# Patient Record
Sex: Male | Born: 1950
Health system: Southern US, Community
[De-identification: ages and names within clinical notes are randomized; demographics above are authoritative.]

## PROBLEM LIST (undated history)

## (undated) DIAGNOSIS — T7840XA Allergy, unspecified, initial encounter: Secondary | ICD-10-CM

## (undated) DIAGNOSIS — C3490 Malignant neoplasm of unspecified part of unspecified bronchus or lung: Secondary | ICD-10-CM

## (undated) DIAGNOSIS — I10 Essential (primary) hypertension: Secondary | ICD-10-CM

## (undated) DIAGNOSIS — K219 Gastro-esophageal reflux disease without esophagitis: Secondary | ICD-10-CM

## (undated) DIAGNOSIS — N529 Male erectile dysfunction, unspecified: Secondary | ICD-10-CM

## (undated) DIAGNOSIS — G893 Neoplasm related pain (acute) (chronic): Secondary | ICD-10-CM

## (undated) DIAGNOSIS — C801 Malignant (primary) neoplasm, unspecified: Secondary | ICD-10-CM

## (undated) DIAGNOSIS — E13319 Other specified diabetes mellitus with unspecified diabetic retinopathy without macular edema: Secondary | ICD-10-CM

## (undated) DIAGNOSIS — I639 Cerebral infarction, unspecified: Secondary | ICD-10-CM

## (undated) DIAGNOSIS — I509 Heart failure, unspecified: Secondary | ICD-10-CM

## (undated) DIAGNOSIS — E785 Hyperlipidemia, unspecified: Secondary | ICD-10-CM

## (undated) DIAGNOSIS — I6529 Occlusion and stenosis of unspecified carotid artery: Secondary | ICD-10-CM

## (undated) DIAGNOSIS — F419 Anxiety disorder, unspecified: Secondary | ICD-10-CM

## (undated) DIAGNOSIS — N400 Enlarged prostate without lower urinary tract symptoms: Secondary | ICD-10-CM

## (undated) HISTORY — DX: Occlusion and stenosis of unspecified carotid artery: I65.29

## (undated) HISTORY — DX: Allergy, unspecified, initial encounter: T78.40XA

## (undated) HISTORY — DX: Benign prostatic hyperplasia without lower urinary tract symptoms: N40.0

## (undated) HISTORY — DX: Neoplasm related pain (acute) (chronic): G89.3

## (undated) HISTORY — DX: Other specified diabetes mellitus with unspecified diabetic retinopathy without macular edema: E13.319

## (undated) HISTORY — DX: Essential (primary) hypertension: I10

## (undated) HISTORY — DX: Hyperlipidemia, unspecified: E78.5

## (undated) HISTORY — PX: TONSILLECTOMY: SUR1361

## (undated) HISTORY — DX: Malignant (primary) neoplasm, unspecified: C80.1

## (undated) HISTORY — DX: Male erectile dysfunction, unspecified: N52.9

## (undated) MED FILL — Fosaprepitant Dimeglumine For IV Infusion 150 MG (Base Eq): INTRAVENOUS | Qty: 5 | Status: AC

## (undated) MED FILL — Dexamethasone Sodium Phosphate Inj 100 MG/10ML: INTRAMUSCULAR | Qty: 1 | Status: AC

---

## 1997-09-19 ENCOUNTER — Ambulatory Visit (HOSPITAL_COMMUNITY): Admission: RE | Admit: 1997-09-19 | Discharge: 1997-09-19 | Payer: Self-pay | Admitting: Cardiology

## 1997-09-22 ENCOUNTER — Ambulatory Visit (HOSPITAL_COMMUNITY): Admission: RE | Admit: 1997-09-22 | Discharge: 1997-09-22 | Payer: Self-pay | Admitting: Cardiology

## 2000-07-14 ENCOUNTER — Encounter (INDEPENDENT_AMBULATORY_CARE_PROVIDER_SITE_OTHER): Payer: Self-pay

## 2000-07-14 ENCOUNTER — Other Ambulatory Visit: Admission: RE | Admit: 2000-07-14 | Discharge: 2000-07-14 | Payer: Self-pay | Admitting: Otolaryngology

## 2001-07-28 ENCOUNTER — Encounter: Payer: Self-pay | Admitting: Emergency Medicine

## 2001-07-28 ENCOUNTER — Emergency Department (HOSPITAL_COMMUNITY): Admission: EM | Admit: 2001-07-28 | Discharge: 2001-07-28 | Payer: Self-pay | Admitting: Emergency Medicine

## 2006-03-03 HISTORY — PX: MELANOMA EXCISION: SHX5266

## 2009-02-17 ENCOUNTER — Emergency Department (HOSPITAL_COMMUNITY): Admission: EM | Admit: 2009-02-17 | Discharge: 2009-02-18 | Payer: Self-pay | Admitting: Emergency Medicine

## 2010-01-08 ENCOUNTER — Encounter: Payer: Self-pay | Admitting: Physician Assistant

## 2010-02-07 ENCOUNTER — Encounter: Payer: Self-pay | Admitting: Cardiovascular Disease

## 2010-02-07 DIAGNOSIS — R079 Chest pain, unspecified: Secondary | ICD-10-CM

## 2010-02-07 DIAGNOSIS — E119 Type 2 diabetes mellitus without complications: Secondary | ICD-10-CM

## 2010-02-07 DIAGNOSIS — R0989 Other specified symptoms and signs involving the circulatory and respiratory systems: Secondary | ICD-10-CM

## 2010-02-07 DIAGNOSIS — E7800 Pure hypercholesterolemia, unspecified: Secondary | ICD-10-CM | POA: Insufficient documentation

## 2010-02-07 DIAGNOSIS — I10 Essential (primary) hypertension: Secondary | ICD-10-CM | POA: Insufficient documentation

## 2010-02-08 ENCOUNTER — Encounter: Payer: Self-pay | Admitting: Cardiovascular Disease

## 2010-02-08 ENCOUNTER — Ambulatory Visit: Payer: Self-pay

## 2010-02-08 ENCOUNTER — Ambulatory Visit: Payer: Self-pay | Admitting: Internal Medicine

## 2010-02-08 ENCOUNTER — Encounter: Payer: Self-pay | Admitting: Internal Medicine

## 2010-04-04 NOTE — Miscellaneous (Signed)
Summary: Orders Update  Clinical Lists Changes  Orders: Added new Test order of Carotid Duplex (Carotid Duplex) - Signed 

## 2010-04-10 NOTE — Letter (Signed)
Summary: [facility]  [facility]   Imported By: Marylou Mccoy 04/03/2010 15:28:05  _____________________________________________________________________  External Attachment:    Type:   Image     Comment:   External Document

## 2011-05-28 ENCOUNTER — Other Ambulatory Visit: Payer: Self-pay | Admitting: Cardiology

## 2011-05-28 DIAGNOSIS — I6529 Occlusion and stenosis of unspecified carotid artery: Secondary | ICD-10-CM

## 2011-05-29 ENCOUNTER — Encounter (INDEPENDENT_AMBULATORY_CARE_PROVIDER_SITE_OTHER): Payer: 59

## 2011-05-29 DIAGNOSIS — R0989 Other specified symptoms and signs involving the circulatory and respiratory systems: Secondary | ICD-10-CM

## 2011-05-29 DIAGNOSIS — I6529 Occlusion and stenosis of unspecified carotid artery: Secondary | ICD-10-CM

## 2011-10-28 ENCOUNTER — Encounter: Payer: Self-pay | Admitting: Vascular Surgery

## 2011-11-06 ENCOUNTER — Encounter: Payer: Self-pay | Admitting: Vascular Surgery

## 2011-11-07 ENCOUNTER — Encounter: Payer: Self-pay | Admitting: Vascular Surgery

## 2011-11-07 ENCOUNTER — Ambulatory Visit (INDEPENDENT_AMBULATORY_CARE_PROVIDER_SITE_OTHER): Payer: 59 | Admitting: Vascular Surgery

## 2011-11-07 VITALS — BP 117/57 | HR 67 | Resp 18 | Ht 67.0 in | Wt 175.3 lb

## 2011-11-07 DIAGNOSIS — I6523 Occlusion and stenosis of bilateral carotid arteries: Secondary | ICD-10-CM

## 2011-11-07 DIAGNOSIS — I658 Occlusion and stenosis of other precerebral arteries: Secondary | ICD-10-CM

## 2011-11-07 DIAGNOSIS — I6529 Occlusion and stenosis of unspecified carotid artery: Secondary | ICD-10-CM | POA: Insufficient documentation

## 2011-11-07 NOTE — Progress Notes (Signed)
[facility] [place]  New Carotid Patient  Referred by:  Dr. Marchia Bond  Reason for referral: B carotid stenosis  History of Present Illness  Micheal Donovan is a 61 y.o. (October 06, 1950) male who presents with chief complaint: B narrowed neck arteries.  Previous carotid studies demonstrated: RICA 60-79% stenosis, LICA 60-79% stenosis.  Patient has no history of TIA or stroke symptom.  The patient has never had amaurosis fugax or monocular blindness.  The patient has never had facial drooping or hemiplegia.  The patient has never had receptive or expressive aphasia.   The patient's risks factors for carotid disease include: HTN, Hyperlipidemia, DM.  Past Medical History  Diagnosis Date  . Hypertension   . Hyperlipidemia   . Allergy   . ED (erectile dysfunction)   . Diabetes mellitus   . Cancer     Melanoma on Neck    2008  . BPH (benign prostatic hypertrophy)   . Carotid artery occlusion     Past Surgical History  Procedure Date  . Melanoma excision 2008    Left side of neck    History   Social History  . Marital Status: Single    Spouse Name: N/A    Number of Children: N/A  . Years of Education: N/A   Occupational History  . Not on file.   Social History Main Topics  . Smoking status: Never Smoker   . Smokeless tobacco: Not on file  . Alcohol Use: No  . Drug Use: No  . Sexually Active:    Other Topics Concern  . Not on file   Social History Narrative  . No narrative on file    Family History  Problem Relation Age of Onset  . COPD Mother   . Heart disease Father   . Heart disease Brother     MI at age 4    Current Outpatient Prescriptions on File Prior to Visit  Medication Sig Dispense Refill  . aspirin 325 MG tablet Take 325 mg by mouth daily.      . Cholecalciferol (VITAMIN D-3) 5000 UNITS TABS Take by mouth.      . insulin glargine (LANTUS) 100 UNIT/ML injection Inject 26 Units into the skin at bedtime.      [name] losartan (COZAAR) 50 MG  tablet Take 50 mg by mouth daily.      . metFORMIN (GLUCOPHAGE) 500 MG tablet Take 500 mg by mouth 2 (two) times daily with a meal.      . niacin (NIASPAN) 500 MG CR tablet Take 500 mg by mouth every morning.      [name] omega-3 acid ethyl esters (LOVAZA) 1 G capsule Take 2 g by mouth 2 (two) times daily.      [name] omeprazole (PRILOSEC) 40 MG capsule Take 40 mg by mouth daily.      . pioglitazone (ACTOS) 15 MG tablet Take 15 mg by mouth daily.      . rosuvastatin (CRESTOR) 20 MG tablet Take 20 mg by mouth daily.        Allergies  Allergen Reactions  . Niaspan (Niacin)     FLUSHING    REVIEW OF SYSTEMS:  (Positives checked otherwise negative)  CARDIOVASCULAR:  [ ]  chest pain, [ ]  chest pressure, [ ]  palpitations, [ ]  shortness of breath when laying flat, [ ]  shortness of breath with exertion,   [ ]  pain in feet when walking, [ ]  pain in feet when laying flat, [ ]  history of  blood clot in veins (DVT), [ ]  history of phlebitis, [ ]  swelling in legs, [ ]  varicose veins  PULMONARY:  [ ]  productive cough, [ ]  asthma, [ ]  wheezing  NEUROLOGIC:  [ ]  weakness in arms or legs, [ ]  numbness in arms or legs, [ ]  difficulty speaking or slurred speech, [ ]  temporary loss of vision in one eye, [ ]  dizziness  HEMATOLOGIC:  [ ]  bleeding problems, [ ]  problems with blood clotting too easily  MUSCULOSKEL:  [ ]  joint pain, [ ]  joint swelling  GASTROINTEST:  [ ]   Vomiting blood, [ ]   Blood in stool     GENITOURINARY:  [ ]   Burning with urination, [ ]   Blood in urine  PSYCHIATRIC:  [ ]  history of major depression  INTEGUMENTARY:  [ ]  rashes, [ ]  ulcers  CONSTITUTIONAL:  [ ]  fever, [ ]  chills  Physical Examination  Filed Vitals:   11/07/11 1028 11/07/11 1030  BP: 119/62 117/57  Pulse: 66 67  Resp: 18   Height: 5\' 7"  (1.702 m)   Weight: 175 lb 4.8 oz (79.516 kg)   SpO2: 98% 100%   Body mass index is 27.46 kg/(m^2).  General: A&O x 3, WDWN  Head: [place]/AT  Ear/Nose/Throat: Hearing grossly intact,  nares w/o erythema or drainage, oropharynx w/o Erythema/Exudate  Eyes: PERRLA, EOMI  Neck: Supple, no nuchal rigidity, no palpable LAD  Pulmonary: Sym exp, good air movt, CTAB, no rales, rhonchi, & wheezing  Cardiac: RRR, Nl S1, S2, no Murmurs, rubs or gallops  Vascular: Vessel Right Left  Radial Palpable Palpable  Ulnar Palpable Palpable  Brachial Palpable Palpable  Carotid Palpable, without bruit Palpable, without bruit  Aorta Not palpable N/A  Femoral Palpable Palpable  Popliteal Not palpable Not palpable  PT Palpable Palpable  DP Palpable Palpable   Gastrointestinal: soft, NTND, -G/R, - HSM, - masses, - CVAT B  Musculoskeletal: M/S 5/5 throughout , Extremities without ischemic changes   Neurologic: CN 2-12 intact , Pain and light touch intact in extremities , Motor exam as listed above  Psychiatric: Judgment intact, Mood & affect appropriate for pt's clinical situation  Dermatologic: See M/S exam for extremity exam, no rashes otherwise noted  Lymph : No Cervical, Axillary, or Inguinal lymphadenopathy   OSH Non-Invasive Vascular Imaging  CAROTID DUPLEX (Date: 05/31/11):   R ICA stenosis: 60-79%  R VA: patent and antegrade  L ICA stenosis: 60-79%  L VA: patent and antegrade  Outside Studies/Documentation 5 pages of outside documents were reviewed including: outside PCP record and outside carotid duplex.  Medical Decision Making  Micheal Donovan is a 61 y.o. male who presents with: asx B ICA stenosis <80%.   Based on the patient's vascular studies and examination, I have offered the patient: q6 month surveillance with B carotid duplex.    I do not routinely operate on asx patient with carotid stenoses <80% based on the findings in the CREST trial.  I discussed in depth with the patient the nature of atherosclerosis, and emphasized the importance of maximal medical management including strict control of blood pressure, blood glucose, and lipid levels, obtaining  regular exercise, antiplatelet agents, and cessation of smoking.    In his case, focus on his DM will be critical to avoid progression of underlying disease.  The patient is aware that without maximal medical management the underlying atherosclerotic disease process will progress, limiting the benefit of any interventions.  Thank you for allowing [place] to participate in this  patient's care.  Leonides Sake, MD [facility] [place] Office: [phone] Pager: [phone]  11/07/2011, 12:44 PM

## 2011-11-07 NOTE — Addendum Note (Signed)
Addended by: Sharee Pimple on: 11/07/2011 01:19 PM   Modules accepted: Orders

## 2012-05-06 ENCOUNTER — Encounter: Payer: Self-pay | Admitting: Neurosurgery

## 2012-05-07 ENCOUNTER — Ambulatory Visit: Payer: 59 | Admitting: Neurosurgery

## 2012-05-07 ENCOUNTER — Other Ambulatory Visit: Payer: 59

## 2012-06-04 ENCOUNTER — Other Ambulatory Visit (INDEPENDENT_AMBULATORY_CARE_PROVIDER_SITE_OTHER): Payer: 59 | Admitting: Vascular Surgery

## 2012-06-04 ENCOUNTER — Ambulatory Visit: Payer: 59 | Admitting: Neurosurgery

## 2012-06-04 DIAGNOSIS — I6523 Occlusion and stenosis of bilateral carotid arteries: Secondary | ICD-10-CM

## 2012-06-04 DIAGNOSIS — I6529 Occlusion and stenosis of unspecified carotid artery: Secondary | ICD-10-CM

## 2012-06-07 ENCOUNTER — Encounter: Payer: Self-pay | Admitting: Vascular Surgery

## 2012-06-11 ENCOUNTER — Telehealth: Payer: Self-pay | Admitting: Family Medicine

## 2012-06-11 MED ORDER — INSULIN GLARGINE 100 UNIT/ML ~~LOC~~ SOLN: 26.0000 [IU] | Freq: Every day | SUBCUTANEOUS | Status: DC

## 2012-06-11 NOTE — Telephone Encounter (Signed)
Med rf per protocol.the patient will call later to make appt

## 2012-07-13 ENCOUNTER — Other Ambulatory Visit: Payer: Self-pay | Admitting: Family Medicine

## 2012-07-13 ENCOUNTER — Other Ambulatory Visit (INDEPENDENT_AMBULATORY_CARE_PROVIDER_SITE_OTHER): Payer: 59

## 2012-07-13 DIAGNOSIS — I10 Essential (primary) hypertension: Secondary | ICD-10-CM

## 2012-07-13 DIAGNOSIS — N400 Enlarged prostate without lower urinary tract symptoms: Secondary | ICD-10-CM

## 2012-07-13 DIAGNOSIS — E785 Hyperlipidemia, unspecified: Secondary | ICD-10-CM

## 2012-07-13 DIAGNOSIS — E119 Type 2 diabetes mellitus without complications: Secondary | ICD-10-CM

## 2012-07-13 DIAGNOSIS — Z0000 Encounter for general adult medical examination without abnormal findings: Secondary | ICD-10-CM

## 2012-07-13 LAB — CBC WITH DIFFERENTIAL/PLATELET
Eosinophils Absolute: 0.2 10*3/uL (ref 0.0–0.7)
Eosinophils Relative: 2 % (ref 0–5)
HCT: 44.6 % (ref 39.0–52.0)
Hemoglobin: 15.1 g/dL (ref 13.0–17.0)
Lymphocytes Relative: 28 % (ref 12–46)
Lymphs Abs: 1.9 10*3/uL (ref 0.7–4.0)
MCH: 29.8 pg (ref 26.0–34.0)
MCV: 88.1 fL (ref 78.0–100.0)
Monocytes Absolute: 0.5 10*3/uL (ref 0.1–1.0)
Monocytes Relative: 7 % (ref 3–12)
Platelets: 254 10*3/uL (ref 150–400)
RBC: 5.06 MIL/uL (ref 4.22–5.81)

## 2012-07-13 LAB — LIPID PANEL
Cholesterol: 124 mg/dL (ref 0–200)
HDL: 30 mg/dL — ABNORMAL LOW (ref >39)
LDL Cholesterol: 70 mg/dL (ref 0–99)
Triglycerides: 121 mg/dL (ref <150)

## 2012-07-13 LAB — PSA: PSA: 1.02 ng/mL (ref <=4.00)

## 2012-07-20 ENCOUNTER — Ambulatory Visit (INDEPENDENT_AMBULATORY_CARE_PROVIDER_SITE_OTHER): Payer: 59 | Admitting: Family Medicine

## 2012-07-20 ENCOUNTER — Encounter: Payer: Self-pay | Admitting: Family Medicine

## 2012-07-20 VITALS — BP 144/94 | HR 56 | Temp 98.0°F | Resp 18 | Ht 67.25 in | Wt 180.0 lb

## 2012-07-20 DIAGNOSIS — Z0000 Encounter for general adult medical examination without abnormal findings: Secondary | ICD-10-CM

## 2012-07-20 DIAGNOSIS — Z23 Encounter for immunization: Secondary | ICD-10-CM

## 2012-07-20 DIAGNOSIS — I10 Essential (primary) hypertension: Secondary | ICD-10-CM

## 2012-07-20 DIAGNOSIS — E119 Type 2 diabetes mellitus without complications: Secondary | ICD-10-CM

## 2012-07-20 DIAGNOSIS — E785 Hyperlipidemia, unspecified: Secondary | ICD-10-CM

## 2012-07-20 DIAGNOSIS — J019 Acute sinusitis, unspecified: Secondary | ICD-10-CM

## 2012-07-20 LAB — COMPREHENSIVE METABOLIC PANEL
ALT: 20 U/L (ref 0–53)
AST: 27 U/L (ref 0–37)
BUN: 15 mg/dL (ref 6–23)
Calcium: 10.2 mg/dL (ref 8.4–10.5)
Chloride: 104 mEq/L (ref 96–112)
Creat: 0.94 mg/dL (ref 0.50–1.35)
Total Bilirubin: 0.5 mg/dL (ref 0.3–1.2)

## 2012-07-20 MED ORDER — SILDENAFIL CITRATE 100 MG PO TABS: 50.0000 mg | ORAL_TABLET | Freq: Every day | ORAL | Status: DC | PRN

## 2012-07-20 MED ORDER — SITAGLIPTIN PHOSPHATE 100 MG PO TABS: 100.0000 mg | ORAL_TABLET | Freq: Every day | ORAL | Status: DC

## 2012-07-20 MED ORDER — AMOXICILLIN-POT CLAVULANATE 875-125 MG PO TABS: 1.0000 | ORAL_TABLET | Freq: Two times a day (BID) | ORAL | Status: DC

## 2012-07-20 NOTE — Progress Notes (Signed)
Subjective:    Patient ID: Micheal Donovan, male    DOB: 05/31/1950, 62 y.o.   MRN: [id number]  HPI Patient is here today for a physical. However he has multiple other medical problems. 1) his blood pressure is elevated 144/94. He has not been taking his losartan 50 mg by mouth daily the last 3 days. He denies any chest pain or shortness of breath.  2) his diabetes is not well controlled. He has not seen a doctor. His A1c was 7.7. His L. metformin 500 mg by mouth daily. He cannot tolerate higher doses. He is also taking Actos 30 mg by mouth daily. He also takes Lantus 28 units subcutaneous each bedtime. His fasting blood sugars are 80 to 130 and well controlled. This indicates that his postprandial sugars must be elevated. He is reporting burning dysesthesias in the feet.  3) he reports left maxillary sinus pain for 3 months and this is accompanied with purulent left nasal discharge and rhinorrhea. He denies fevers or headaches. 4) he has a slight raised area on his left scalp. It is approximately 2 cm in diameter. It is soft. It's seems to be subcutaneous swelling. There is no obvious mass. He states it's been there for several months. He may have hit his head this area. Past Medical History  Diagnosis Date  . Hypertension   . Hyperlipidemia   . Allergy   . ED (erectile dysfunction)   . Diabetes mellitus   . Cancer     Melanoma on Neck    2008  . BPH (benign prostatic hypertrophy)   . Carotid artery occlusion    Current Outpatient Prescriptions on File Prior to Visit  Medication Sig Dispense Refill  . aspirin 325 MG tablet Take 325 mg by mouth daily.      . Cholecalciferol (VITAMIN D-3) 5000 UNITS TABS Take by mouth.      . losartan (COZAAR) 50 MG tablet Take 50 mg by mouth daily.      . metFORMIN (GLUCOPHAGE) 500 MG tablet Take 500 mg by mouth 2 (two) times daily with a meal.      . niacin (NIASPAN) 500 MG CR tablet Take 500 mg by mouth every morning.      [name] omega-3 acid ethyl esters  (LOVAZA) 1 G capsule Take 2 g by mouth 2 (two) times daily.      [name] omeprazole (PRILOSEC) 40 MG capsule Take 40 mg by mouth daily.      . pioglitazone (ACTOS) 15 MG tablet Take 15 mg by mouth daily.      . rosuvastatin (CRESTOR) 20 MG tablet Take 20 mg by mouth daily.       No current facility-administered medications on file prior to visit.   Allergies  Allergen Reactions  . Niaspan (Niacin)     FLUSHING   History   Social History  . Marital Status: Single    Spouse Name: N/A    Number of Children: N/A  . Years of Education: N/A   Occupational History  . Not on file.   Social History Main Topics  . Smoking status: Former Smoker    Quit date: 07/21/1990  . Smokeless tobacco: Never Used  . Alcohol Use: No  . Drug Use: No  . Sexually Active: Not on file   Other Topics Concern  . Not on file   Social History Narrative  . No narrative on file   Family History  Problem Relation Age of Onset  . COPD Mother   .  Heart disease Father   . Heart disease Brother     MI at age 46      Review of Systems  All other systems reviewed and are negative.       Objective:   Physical Exam  Constitutional: He is oriented to person, place, and time. He appears well-developed and well-nourished.  HENT:  Head: Normocephalic and atraumatic.  Right Ear: External ear normal.  Left Ear: External ear normal.  Nose: Nose normal.  Mouth/Throat: Oropharynx is clear and moist. No oropharyngeal exudate.  Eyes: Conjunctivae and EOM are normal. Pupils are equal, round, and reactive to light. Right eye exhibits no discharge. Left eye exhibits no discharge. No scleral icterus.  Neck: Normal range of motion. Neck supple. No JVD present. No tracheal deviation present. No thyromegaly present.  Cardiovascular: Normal rate, regular rhythm, normal heart sounds and intact distal pulses.  Exam reveals no gallop and no friction rub.   No murmur heard. Pulmonary/Chest: Effort normal and breath sounds  normal. No respiratory distress. He has no wheezes. He has no rales. He exhibits no tenderness.  Abdominal: Soft. Bowel sounds are normal. He exhibits no distension and no mass. There is no tenderness. There is no rebound and no guarding.  Genitourinary: Rectum normal, prostate normal and penis normal.  Musculoskeletal: Normal range of motion. He exhibits no edema and no tenderness.  Lymphadenopathy:    He has no cervical adenopathy.  Neurological: He is alert and oriented to person, place, and time. He has normal reflexes. He displays normal reflexes. No cranial nerve deficit. He exhibits normal muscle tone. Coordination normal.  Skin: Skin is warm and dry. No rash noted. No erythema. No pallor.  Psychiatric: He has a normal mood and affect. His behavior is normal. Judgment and thought content normal.          Assessment & Plan:  1. Immunization due - Pneumococcal polysaccharide vaccine 23-valent greater than or equal to 2yo subcutaneous/IM I did discuss the shingle shot with the patient. He will check on the price and return if he wants the vaccine. 2. Routine general medical examination at a health care facility I reviewed with the patient his CBC, fasting lipid panel, hemoglobin A1c, PSA. Patient's physical exam is normal.  He is overdue for colonoscopy. I will schedule that referral.  His prostate exam is normal his PSA is normal.  I recommended an ophthalmology exam which he will schedule. - Ambulatory referral to Gastroenterology  3. Acute rhinosinusitis Augmentin 875 mg 1 by mouth twice a day for 10 days  4. DM (diabetes mellitus) Control is not optimal. Add Januvia 100 mg by mouth daily. Recheck hemoglobin A1c in 3 months. If his two-hour postprandial sugars are not under control at that time, he will likely need prandial NovoLog.  I also spent 10 minutes discussing with him a low carbohydrate diet. I emphasized that he needs to be more diligent in limiting carbohydrates with his  meal.  For instance he tends to eat a lot spaghetti and potatoes  5. HTN (hypertension) Resume his and 50 mg by mouth daily and recheck blood pressure in one month the  6. HLD (hyperlipidemia) I reviewed the patient's fasting lipid panel. His LDL is at goal. His HDL remains low at 30. I recommended he increase his aerobic exercise. He does not tolerate higher doses of Niaspan.

## 2012-07-21 ENCOUNTER — Telehealth: Payer: Self-pay | Admitting: Family Medicine

## 2012-07-21 MED ORDER — ROSUVASTATIN CALCIUM 20 MG PO TABS: 20.0000 mg | ORAL_TABLET | Freq: Every day | ORAL | Status: DC

## 2012-07-21 NOTE — Telephone Encounter (Signed)
Med refilled.

## 2012-08-06 ENCOUNTER — Telehealth: Payer: Self-pay | Admitting: Family Medicine

## 2012-08-06 MED ORDER — PIOGLITAZONE HCL 15 MG PO TABS: 15.0000 mg | ORAL_TABLET | Freq: Every day | ORAL | Status: DC

## 2012-08-06 MED ORDER — NIACIN ER (ANTIHYPERLIPIDEMIC) 500 MG PO TBCR: 500.0000 mg | EXTENDED_RELEASE_TABLET | Freq: Every morning | ORAL | Status: DC

## 2012-08-06 NOTE — Telephone Encounter (Signed)
Medication refilled per protocol. 

## 2012-08-16 ENCOUNTER — Telehealth: Payer: Self-pay | Admitting: Family Medicine

## 2012-08-16 MED ORDER — OMEGA-3-ACID ETHYL ESTERS 1 G PO CAPS: 2.0000 g | ORAL_CAPSULE | Freq: Two times a day (BID) | ORAL | Status: DC

## 2012-08-16 MED ORDER — ROSUVASTATIN CALCIUM 20 MG PO TABS: 20.0000 mg | ORAL_TABLET | Freq: Every day | ORAL | Status: DC

## 2012-08-16 NOTE — Telephone Encounter (Signed)
Rx Refilled  

## 2012-10-18 ENCOUNTER — Telehealth: Payer: Self-pay | Admitting: Family Medicine

## 2012-10-18 MED ORDER — INSULIN GLARGINE 100 UNIT/ML SOLOSTAR PEN: 28.0000 [IU] | PEN_INJECTOR | SUBCUTANEOUS | Status: DC

## 2012-10-18 NOTE — Telephone Encounter (Signed)
Rx Refilled  

## 2012-10-18 NOTE — Telephone Encounter (Signed)
Lantus Solostar 100 units/mL inject 26 units QHS #15

## 2012-11-24 ENCOUNTER — Other Ambulatory Visit: Payer: Self-pay | Admitting: *Deleted

## 2012-12-09 ENCOUNTER — Encounter: Payer: Self-pay | Admitting: Family

## 2012-12-10 ENCOUNTER — Inpatient Hospital Stay (HOSPITAL_COMMUNITY): Admission: RE | Admit: 2012-12-10 | Payer: 59 | Source: Ambulatory Visit

## 2012-12-10 ENCOUNTER — Ambulatory Visit: Payer: 59 | Admitting: Family

## 2013-01-10 ENCOUNTER — Other Ambulatory Visit: Payer: Self-pay | Admitting: Family Medicine

## 2013-01-10 MED ORDER — SITAGLIPTIN PHOSPHATE 100 MG PO TABS: 100.0000 mg | ORAL_TABLET | Freq: Every day | ORAL | Status: DC

## 2013-01-10 NOTE — Telephone Encounter (Signed)
Letter sent for pt to make an appt and medication refilled

## 2013-01-25 ENCOUNTER — Other Ambulatory Visit: Payer: Self-pay | Admitting: Family Medicine

## 2013-01-25 DIAGNOSIS — E119 Type 2 diabetes mellitus without complications: Secondary | ICD-10-CM

## 2013-01-25 DIAGNOSIS — E785 Hyperlipidemia, unspecified: Secondary | ICD-10-CM

## 2013-01-25 DIAGNOSIS — Z79899 Other long term (current) drug therapy: Secondary | ICD-10-CM

## 2013-01-25 DIAGNOSIS — I10 Essential (primary) hypertension: Secondary | ICD-10-CM

## 2013-01-31 ENCOUNTER — Other Ambulatory Visit: Payer: 59

## 2013-01-31 DIAGNOSIS — I10 Essential (primary) hypertension: Secondary | ICD-10-CM

## 2013-01-31 DIAGNOSIS — Z79899 Other long term (current) drug therapy: Secondary | ICD-10-CM

## 2013-01-31 DIAGNOSIS — E785 Hyperlipidemia, unspecified: Secondary | ICD-10-CM

## 2013-01-31 DIAGNOSIS — E119 Type 2 diabetes mellitus without complications: Secondary | ICD-10-CM

## 2013-01-31 LAB — CBC WITH DIFFERENTIAL/PLATELET
Basophils Absolute: 0 10*3/uL (ref 0.0–0.1)
Basophils Relative: 1 % (ref 0–1)
Eosinophils Absolute: 0.2 10*3/uL (ref 0.0–0.7)
Eosinophils Relative: 2 % (ref 0–5)
HCT: 43 % (ref 39.0–52.0)
MCH: 31 pg (ref 26.0–34.0)
MCHC: 34.2 g/dL (ref 30.0–36.0)
MCV: 90.7 fL (ref 78.0–100.0)
Monocytes Absolute: 0.4 10*3/uL (ref 0.1–1.0)
Platelets: 243 10*3/uL (ref 150–400)
RDW: 13.7 % (ref 11.5–15.5)

## 2013-01-31 LAB — LIPID PANEL
Cholesterol: 121 mg/dL (ref 0–200)
HDL: 27 mg/dL — ABNORMAL LOW (ref >39)
LDL Cholesterol: 73 mg/dL (ref 0–99)
Total CHOL/HDL Ratio: 4.5 Ratio
Triglycerides: 104 mg/dL (ref <150)
VLDL: 21 mg/dL (ref 0–40)

## 2013-01-31 LAB — COMPREHENSIVE METABOLIC PANEL
ALT: 19 U/L (ref 0–53)
AST: 20 U/L (ref 0–37)
Alkaline Phosphatase: 78 U/L (ref 39–117)
BUN: 15 mg/dL (ref 6–23)
Calcium: 9.8 mg/dL (ref 8.4–10.5)
Chloride: 102 mEq/L (ref 96–112)
Creat: 0.81 mg/dL (ref 0.50–1.35)
Total Bilirubin: 0.4 mg/dL (ref 0.3–1.2)

## 2013-01-31 LAB — HEMOGLOBIN A1C: Hgb A1c MFr Bld: 6.9 % — ABNORMAL HIGH (ref <5.7)

## 2013-02-07 ENCOUNTER — Other Ambulatory Visit: Payer: Self-pay | Admitting: Family Medicine

## 2013-02-07 MED ORDER — PIOGLITAZONE HCL 15 MG PO TABS: 15.0000 mg | ORAL_TABLET | Freq: Every day | ORAL | Status: DC

## 2013-02-07 MED ORDER — OMEGA-3-ACID ETHYL ESTERS 1 G PO CAPS: 2.0000 g | ORAL_CAPSULE | Freq: Two times a day (BID) | ORAL | Status: DC

## 2013-02-07 NOTE — Telephone Encounter (Signed)
Rx Refilled and pt has appt. 02/10/13

## 2013-02-10 ENCOUNTER — Encounter: Payer: Self-pay | Admitting: Family Medicine

## 2013-02-10 ENCOUNTER — Ambulatory Visit (INDEPENDENT_AMBULATORY_CARE_PROVIDER_SITE_OTHER): Payer: 59 | Admitting: Family Medicine

## 2013-02-10 VITALS — BP 146/90 | HR 66 | Temp 97.1°F | Resp 16 | Ht 67.0 in | Wt 182.0 lb

## 2013-02-10 DIAGNOSIS — E785 Hyperlipidemia, unspecified: Secondary | ICD-10-CM

## 2013-02-10 DIAGNOSIS — E119 Type 2 diabetes mellitus without complications: Secondary | ICD-10-CM

## 2013-02-10 DIAGNOSIS — I10 Essential (primary) hypertension: Secondary | ICD-10-CM

## 2013-02-10 NOTE — Progress Notes (Signed)
Subjective:    Patient ID: Micheal Donovan, male    DOB: 18-Mar-1950, 62 y.o.   MRN: [id number]  HPI Patient is here today for followup of his diabetes mellitus type 2, his mixed dyslipidemia, and hypertension. His medication list is reviewed. He denies any chest pain shortness of breath or dyspnea on exertion.  He is currently on Lantus 22 units subcutaneous daily along with metformin and Januvia. His A1c has dropped from 7.7-6.9 with very happy with. His LDL cholesterol remains excellent at 73. His HDL remains very low, however this is Morrie Sheldon did for this patient given his critically low HDL in the past.  He is due for Pneumovax 23. He has had his flu shot at work.   Appointment on 01/31/2013  Component Date Value Range Status  . WBC 01/31/2013 6.2  4.0 - 10.5 K/uL Final  . RBC 01/31/2013 4.74  4.22 - 5.81 MIL/uL Final  . Hemoglobin 01/31/2013 14.7  13.0 - 17.0 g/dL Final  . HCT 40/98/1191 43.0  39.0 - 52.0 % Final  . MCV 01/31/2013 90.7  78.0 - 100.0 fL Final  . MCH 01/31/2013 31.0  26.0 - 34.0 pg Final  . MCHC 01/31/2013 34.2  30.0 - 36.0 g/dL Final  . RDW 47/82/9562 13.7  11.5 - 15.5 % Final  . Platelets 01/31/2013 243  150 - 400 K/uL Final  . Neutrophils Relative % 01/31/2013 58  43 - 77 % Final  . Neutro Abs 01/31/2013 3.6  1.7 - 7.7 K/uL Final  . Lymphocytes Relative 01/31/2013 32  12 - 46 % Final  . Lymphs Abs 01/31/2013 2.0  0.7 - 4.0 K/uL Final  . Monocytes Relative 01/31/2013 7  3 - 12 % Final  . Monocytes Absolute 01/31/2013 0.4  0.1 - 1.0 K/uL Final  . Eosinophils Relative 01/31/2013 2  0 - 5 % Final  . Eosinophils Absolute 01/31/2013 0.2  0.0 - 0.7 K/uL Final  . Basophils Relative 01/31/2013 1  0 - 1 % Final  . Basophils Absolute 01/31/2013 0.0  0.0 - 0.1 K/uL Final  . Smear Review 01/31/2013 Criteria for review not met   Final  . Sodium 01/31/2013 138  135 - 145 mEq/L Final  . Potassium 01/31/2013 4.6  3.5 - 5.3 mEq/L Final  . Chloride 01/31/2013 102  96 - 112 mEq/L Final   . CO2 01/31/2013 28  19 - 32 mEq/L Final  . Glucose, Bld 01/31/2013 129* 70 - 99 mg/dL Final  . BUN 13/10/6576 15  6 - 23 mg/dL Final  . Creat 46/96/2952 0.81  0.50 - 1.35 mg/dL Final  . Total Bilirubin 01/31/2013 0.4  0.3 - 1.2 mg/dL Final  . Alkaline Phosphatase 01/31/2013 78  39 - 117 U/L Final  . AST 01/31/2013 20  0 - 37 U/L Final  . ALT 01/31/2013 19  0 - 53 U/L Final  . Total Protein 01/31/2013 7.0  6.0 - 8.3 g/dL Final  . Albumin 84/13/2440 4.7  3.5 - 5.2 g/dL Final  . Calcium 12/28/2534 9.8  8.4 - 10.5 mg/dL Final  . Cholesterol 64/40/3474 121  0 - 200 mg/dL Final   Comment: ATP III Classification:                                < 200        mg/dL        Desirable  200 - 239     mg/dL        Borderline High                               >= 240        mg/dL        High                             . Triglycerides 01/31/2013 104  <150 mg/dL Final  . HDL 09/81/1914 27* >39 mg/dL Final  . Total CHOL/HDL Ratio 01/31/2013 4.5   Final  . VLDL 01/31/2013 21  0 - 40 mg/dL Final  . LDL Cholesterol 01/31/2013 73  0 - 99 mg/dL Final   Comment:                            Total Cholesterol/HDL Ratio:CHD Risk                                                 Coronary Heart Disease Risk Table                                                                 Men       Women                                   1/2 Average Risk              3.4        3.3                                       Average Risk              5.0        4.4                                    2X Average Risk              9.6        7.1                                    3X Average Risk             23.4       11.0                          Use the calculated Patient Ratio above and the CHD Risk table  to determine the patient's CHD Risk.                          ATP III Classification (LDL):                                < 100        mg/dL         Optimal                                100 - 129     mg/dL         Near or Above Optimal                               130 - 159     mg/dL         Borderline High                               160 - 189     mg/dL         High                                > 190        mg/dL         Very High                             . Hemoglobin A1C 01/31/2013 6.9* <5.7 % Final   Comment:                                                                                                 According to the ADA Clinical Practice Recommendations for 2011, when                          HbA1c is used as a screening test:                                                       >=6.5%   Diagnostic of Diabetes Mellitus                                     (if abnormal result is confirmed)  5.7-6.4%   Increased risk of developing Diabetes Mellitus                                                     References:Diagnosis and Classification of Diabetes Mellitus,Diabetes                          Care,2011,34(Suppl 1):S62-S69 and Standards of Medical Care in                                  Diabetes - 2011,Diabetes Care,2011,34 (Suppl 1):S11-S61.                             . Mean Plasma Glucose 01/31/2013 151* <117 mg/dL Final   Past Medical History  Diagnosis Date  . Hypertension   . Hyperlipidemia   . Allergy   . ED (erectile dysfunction)   . Diabetes mellitus   . Cancer     Melanoma on Neck    2008  . BPH (benign prostatic hypertrophy)   . Carotid artery occlusion    Past Surgical History  Procedure Laterality Date  . Melanoma excision  2008    Left side of neck   Current Outpatient Prescriptions on File Prior to Visit  Medication Sig Dispense Refill  . aspirin 325 MG tablet Take 325 mg by mouth daily.      . diphenhydramine-acetaminophen (TYLENOL PM) 25-500 MG TABS Take 1 tablet by mouth at bedtime as needed.      [name] losartan (COZAAR) 50 MG tablet Take 50 mg by mouth daily.        . metFORMIN (GLUCOPHAGE) 500 MG tablet Take 500 mg by mouth 2 (two) times daily with a meal.      . omega-3 acid ethyl esters (LOVAZA) 1 G capsule Take 2 capsules (2 g total) by mouth 2 (two) times daily.  120 capsule  5  . ONE TOUCH ULTRA TEST test strip 1 each by Other route 2 (two) times daily.      . pioglitazone (ACTOS) 15 MG tablet Take 1 tablet (15 mg total) by mouth daily.  30 tablet  5  . rosuvastatin (CRESTOR) 20 MG tablet Take 1 tablet (20 mg total) by mouth daily.  30 tablet  5  . sildenafil (VIAGRA) 100 MG tablet Take 0.5-1 tablets (50-100 mg total) by mouth daily as needed for erectile dysfunction.  5 tablet  11  . sitaGLIPtin (JANUVIA) 100 MG tablet Take 1 tablet (100 mg total) by mouth daily.  30 tablet  1  . Cholecalciferol (VITAMIN D-3) 5000 UNITS TABS Take by mouth.       No current facility-administered medications on file prior to visit.   Allergies  Allergen Reactions  . Niaspan [Niacin]     FLUSHING   History   Social History  . Marital Status: Single    Spouse Name: N/A    Number of Children: N/A  . Years of Education: N/A   Occupational History  . Not on file.   Social History Main Topics  . Smoking status: Former Smoker    Quit date: 07/21/1990  . Smokeless tobacco: Never Used  . Alcohol Use: No  . Drug  Use: No  . Sexual Activity: Not on file   Other Topics Concern  . Not on file   Social History Narrative  . No narrative on file      Review of Systems  All other systems reviewed and are negative.       Objective:   Physical Exam  Vitals reviewed. Constitutional: He appears well-developed and well-nourished.  Neck: Neck supple. No JVD present. No thyromegaly present.  Cardiovascular: Normal rate, regular rhythm and normal heart sounds.  Exam reveals no gallop and no friction rub.   No murmur heard. Pulmonary/Chest: Effort normal and breath sounds normal. No respiratory distress. He has no wheezes. He has no rales.  Abdominal: Soft.  Bowel sounds are normal. He exhibits no distension. There is no tenderness. There is no rebound and no guarding.  Musculoskeletal: He exhibits no edema.  Lymphadenopathy:    He has no cervical adenopathy.          Assessment & Plan:  1. Type II or unspecified type diabetes mellitus without mention of complication, not stated as uncontrolled Hemoglobin A1c is currently well controlled at 6.9. I recommended Pneumovax 23 the patient declined a vaccine for now.    2. HTN (hypertension) Blood pressure is not well controlled. I recommended increasing losartan 100 mg by mouth daily. The patient refused to do that. He like to check his blood pressure daily at home for 2 weeks and then increase the medication if it's consistently greater than 140/90.  3. HLD (hyperlipidemia) Patient's cholesterol is acceptable particularly given his past medical history. No changes in medication at this time

## 2013-03-10 ENCOUNTER — Other Ambulatory Visit: Payer: Self-pay | Admitting: Family Medicine

## 2013-03-10 MED ORDER — SITAGLIPTIN PHOSPHATE 100 MG PO TABS: 100.0000 mg | ORAL_TABLET | Freq: Every day | ORAL | Status: DC

## 2013-03-10 NOTE — Telephone Encounter (Signed)
Rx Refilled  

## 2013-05-04 ENCOUNTER — Ambulatory Visit: Payer: 59 | Admitting: Physician Assistant

## 2013-05-20 ENCOUNTER — Other Ambulatory Visit: Payer: Self-pay | Admitting: Family Medicine

## 2013-05-20 MED ORDER — METFORMIN HCL 500 MG PO TABS
500.0000 mg | ORAL_TABLET | Freq: Two times a day (BID) | ORAL | Status: DC
Start: 2013-05-20 — End: 2013-09-13

## 2013-05-20 MED ORDER — INSULIN GLARGINE 100 UNIT/ML SOLOSTAR PEN
22.0000 [IU] | PEN_INJECTOR | SUBCUTANEOUS | Status: DC
Start: 2013-05-20 — End: 2014-02-28

## 2013-05-20 NOTE — Telephone Encounter (Signed)
Rx Refilled  

## 2013-06-22 ENCOUNTER — Other Ambulatory Visit: Payer: Self-pay | Admitting: Family Medicine

## 2013-06-22 MED ORDER — NIACIN ER (ANTIHYPERLIPIDEMIC) 500 MG PO TBCR: 500.0000 mg | EXTENDED_RELEASE_TABLET | Freq: Every day | ORAL | Status: DC

## 2013-06-26 ENCOUNTER — Encounter (HOSPITAL_COMMUNITY): Payer: Self-pay | Admitting: Emergency Medicine

## 2013-06-26 ENCOUNTER — Emergency Department (HOSPITAL_COMMUNITY)
Admission: EM | Admit: 2013-06-26 | Discharge: 2013-06-26 | Disposition: A | Discharge status: Home / Self Care | Payer: 59 | Source: Home / Self Care | Attending: Family Medicine | Admitting: Family Medicine

## 2013-06-26 DIAGNOSIS — M545 Low back pain, unspecified: Secondary | ICD-10-CM

## 2013-06-26 MED ORDER — TRAMADOL HCL 50 MG PO TABS: 50.0000 mg | ORAL_TABLET | Freq: Four times a day (QID) | ORAL | Status: DC | PRN

## 2013-06-26 MED ORDER — PREDNISONE 10 MG PO TABS: 30.0000 mg | ORAL_TABLET | Freq: Every day | ORAL | Status: DC

## 2013-06-26 NOTE — ED Notes (Signed)
States woke up with right hip pain 4/22.  Pain much worse with laying or sitting.  Unable to sleep due to pain.  Denies injury.  Has been taking ASA.

## 2013-06-26 NOTE — Discharge Instructions (Signed)
Thank you for coming in today. Take prednisone daily for 5 days. This will increase your blood sugar. Use tramadol for severe pain as needed. Do not work or drive after taking tramadol. Come back or go to the emergency room if you notice new weakness new numbness problems walking or bowel or bladder problems.  Back Pain, Adult Low back pain is very common. About 1 in 5 people have back pain.The cause of low back pain is rarely dangerous. The pain often gets better over time.About half of people with a sudden onset of back pain feel better in just 2 weeks. About 8 in 10 people feel better by 6 weeks.  CAUSES Some common causes of back pain include:  Strain of the muscles or ligaments supporting the spine.  Wear and tear (degeneration) of the spinal discs.  Arthritis.  Direct injury to the back. DIAGNOSIS Most of the time, the direct cause of low back pain is not known.However, back pain can be treated effectively even when the exact cause of the pain is unknown.Answering your caregiver's questions about your overall health and symptoms is one of the most accurate ways to make sure the cause of your pain is not dangerous. If your caregiver needs more information, he or she may order lab work or imaging tests (X-rays or MRIs).However, even if imaging tests show changes in your back, this usually does not require surgery. HOME CARE INSTRUCTIONS For many people, back pain returns.Since low back pain is rarely dangerous, it is often a condition that people can learn to [facility] their own.   Remain active. It is stressful on the back to sit or stand in one place. Do not sit, drive, or stand in one place for more than 30 minutes at a time. Take short walks on level surfaces as soon as pain allows.Try to increase the length of time you walk each day.  Do not stay in bed.Resting more than 1 or 2 days can delay your recovery.  Do not avoid exercise or work.Your body is made to move.It is  not dangerous to be active, even though your back may hurt.Your back will likely heal faster if you return to being active before your pain is gone.  Pay attention to your body when you bend and lift. Many people have less discomfortwhen lifting if they bend their knees, keep the load close to their bodies,and avoid twisting. Often, the most comfortable positions are those that put less stress on your recovering back.  Find a comfortable position to sleep. Use a firm mattress and lie on your side with your knees slightly bent. If you lie on your back, put a pillow under your knees.  Only take over-the-counter or prescription medicines as directed by your caregiver. Over-the-counter medicines to reduce pain and inflammation are often the most helpful.Your caregiver may prescribe muscle relaxant drugs.These medicines help dull your pain so you can more quickly return to your normal activities and healthy exercise.  Put ice on the injured area.  Put ice in a plastic bag.  Place a towel between your skin and the bag.  Leave the ice on for 15-20 minutes, 03-04 times a day for the first 2 to 3 days. After that, ice and heat may be alternated to reduce pain and spasms.  Ask your caregiver about trying back exercises and gentle massage. This may be of some benefit.  Avoid feeling anxious or stressed.Stress increases muscle tension and can worsen back pain.It is important to recognize  when you are anxious or stressed and learn ways to manage it.Exercise is a great option. SEEK MEDICAL CARE IF:  You have pain that is not relieved with rest or medicine.  You have pain that does not improve in 1 week.  You have new symptoms.  You are generally not feeling well. SEEK IMMEDIATE MEDICAL CARE IF:   You have pain that radiates from your back into your legs.  You develop new bowel or bladder control problems.  You have unusual weakness or numbness in your arms or legs.  You develop nausea  or vomiting.  You develop abdominal pain.  You feel faint. Document Released: 02/17/2005 Document Revised: 08/19/2011 Document Reviewed: 07/08/2010 [facility] Patient Information 2014 [place], [place].

## 2013-06-26 NOTE — ED Provider Notes (Signed)
Micheal Donovan is a 63 y.o. male who presents to Urgent Care today for right hip pain. Patient has 5 days of pain in his right buttock radiating to the right knee. This is worse with activity and better with rest. He denies any weakness or numbness bowel bladder dysfunction or difficulty walking. He is able to work. Pain is interfering with sleep. He's tried aspirin which has not helped. He denies any injury. No fevers chills nausea vomiting or diarrhea.   Past Medical History  Diagnosis Date  . Hypertension   . Hyperlipidemia   . Allergy   . ED (erectile dysfunction)   . Diabetes mellitus   . Cancer     Melanoma on Neck    2008  . BPH (benign prostatic hypertrophy)   . Carotid artery occlusion    History  Substance Use Topics  . Smoking status: Former Smoker    Quit date: 07/21/1990  . Smokeless tobacco: Never Used  . Alcohol Use: No   ROS as above Medications: No current facility-administered medications for this encounter.   Current Outpatient Prescriptions  Medication Sig Dispense Refill  . aspirin 325 MG tablet Take 325 mg by mouth daily.      . Cholecalciferol (VITAMIN D-3) 5000 UNITS TABS Take by mouth.      . diphenhydramine-acetaminophen (TYLENOL PM) 25-500 MG TABS Take 1 tablet by mouth at bedtime as needed.      . Insulin Glargine (LANTUS) 100 UNIT/ML Solostar Pen Inject 22 Units into the skin every morning.  15 mL  3  . losartan (COZAAR) 50 MG tablet Take 50 mg by mouth daily.      . metFORMIN (GLUCOPHAGE) 500 MG tablet Take 1 tablet (500 mg total) by mouth 2 (two) times daily with a meal.  60 tablet  3  . niacin (NIASPAN) 500 MG CR tablet Take 1 tablet (500 mg total) by mouth at bedtime.  30 tablet  3  . omega-3 acid ethyl esters (LOVAZA) 1 G capsule Take 2 capsules (2 g total) by mouth 2 (two) times daily.  120 capsule  5  . omeprazole (PRILOSEC) 20 MG capsule Take 20 mg by mouth daily.      . ONE TOUCH ULTRA TEST test strip 1 each by Other route 2 (two) times daily.       . pioglitazone (ACTOS) 15 MG tablet Take 1 tablet (15 mg total) by mouth daily.  30 tablet  5  . predniSONE (DELTASONE) 10 MG tablet Take 3 tablets (30 mg total) by mouth daily.  15 tablet  0  . rosuvastatin (CRESTOR) 20 MG tablet Take 1 tablet (20 mg total) by mouth daily.  30 tablet  5  . sildenafil (VIAGRA) 100 MG tablet Take 0.5-1 tablets (50-100 mg total) by mouth daily as needed for erectile dysfunction.  5 tablet  11  . sitaGLIPtin (JANUVIA) 100 MG tablet Take 1 tablet (100 mg total) by mouth daily.  30 tablet  3  . traMADol (ULTRAM) 50 MG tablet Take 1 tablet (50 mg total) by mouth every 6 (six) hours as needed.  15 tablet  0    Exam:  BP 181/79  Pulse 76  Temp(Src) 98.7 F (37.1 C) (Oral)  Resp 18  SpO2 100% Gen: Well NAD HEENT: EOMI,  MMM Lungs: Normal work of breathing. CTABL Heart: RRR no MRG Abd: NABS, Soft. NT, ND Exts: Brisk capillary refill, warm and well perfused.  Back: Nontender to spinal midline. Tender palpation right SI joint.  Negative straight leg and Faber test bilaterally. Reflexes are equal bilateral extremities. Normal strength bilaterally. Normal gait Hips bilaterally have decreased range of motion to rotation.  No results found for this or any previous visit (from the past 24 hour(s)). No results found.  Assessment and Plan: 63 y.o. male with right-sided lumbago with sciatica symptoms. Plan to treat with low-dose prednisone, and Ultram. Followup with primary care provider. Patient clinically has some hip DJD however this is not a major component of his symptoms today.  Discussed warning signs or symptoms. Please see discharge instructions. Patient expresses understanding.    Gregor Hams, MD 06/26/13 [phone]

## 2013-06-27 ENCOUNTER — Other Ambulatory Visit: Payer: Self-pay | Admitting: *Deleted

## 2013-06-27 MED ORDER — LOSARTAN POTASSIUM 50 MG PO TABS: 50.0000 mg | ORAL_TABLET | Freq: Every day | ORAL | Status: DC

## 2013-06-27 NOTE — Telephone Encounter (Signed)
Refill appropriate and filled per protocol. 

## 2013-06-30 ENCOUNTER — Encounter (HOSPITAL_COMMUNITY): Payer: Self-pay | Admitting: Emergency Medicine

## 2013-06-30 ENCOUNTER — Emergency Department (HOSPITAL_COMMUNITY)
Admission: EM | Admit: 2013-06-30 | Discharge: 2013-06-30 | Disposition: A | Discharge status: Home / Self Care | Payer: 59 | Source: Home / Self Care | Attending: Emergency Medicine | Admitting: Emergency Medicine

## 2013-06-30 DIAGNOSIS — M5430 Sciatica, unspecified side: Secondary | ICD-10-CM

## 2013-06-30 MED ORDER — PREDNISONE (PAK) 10 MG PO TABS: ORAL_TABLET | Freq: Every day | ORAL | Status: DC

## 2013-06-30 NOTE — ED Provider Notes (Signed)
CSN: [id number]     Arrival date & time 06/30/13  1859 History   First MD Initiated Contact with Patient 06/30/13 2107     Chief Complaint  Patient presents with  . Hip Pain   (Consider location/radiation/quality/duration/timing/severity/associated sxs/prior Treatment) HPI Patient is a 63 yo M with R hip pain. Evaluated 5 days ago for the same, treated with 5 day burst of prednisone and Tramadol prn. Patient states he was feeling much better until he stopped prednisone, now the pain has returned and worse. He states it goes from sacrum, to right hip and down lateral leg to the knee. No numbness or tingling present. He denies any injury. Never had anything like this before. No falls, fevers, incontinence.  Past Medical History  Diagnosis Date  . Hypertension   . Hyperlipidemia   . Allergy   . ED (erectile dysfunction)   . Diabetes mellitus   . Cancer     Melanoma on Neck    2008  . BPH (benign prostatic hypertrophy)   . Carotid artery occlusion    Past Surgical History  Procedure Laterality Date  . Melanoma excision  2008    Left side of neck   Family History  Problem Relation Age of Onset  . COPD Mother   . Heart disease Father   . Heart disease Brother     MI at age 47   History  Substance Use Topics  . Smoking status: Former Smoker    Quit date: 07/21/1990  . Smokeless tobacco: Never Used  . Alcohol Use: No    Review of Systems  Constitutional: Negative for fever and chills.  HENT: Negative for congestion.   Eyes: Negative for visual disturbance.  Respiratory: Negative for cough and shortness of breath.   Cardiovascular: Negative for chest pain and leg swelling.  Gastrointestinal: Negative for abdominal pain.  Genitourinary: Negative for dysuria.  Musculoskeletal: Positive for arthralgias, back pain, gait problem and myalgias.  Skin: Negative for rash.  Neurological: Negative for headaches.    Allergies  Niaspan  Home Medications   Prior to Admission  medications   Medication Sig Start Date End Date Taking? Authorizing Provider  aspirin 325 MG tablet Take 325 mg by mouth daily.   Yes Historical Provider, MD  Cholecalciferol (VITAMIN D-3) 5000 UNITS TABS Take by mouth.   Yes Historical Provider, MD  diphenhydramine-acetaminophen (TYLENOL PM) 25-500 MG TABS Take 1 tablet by mouth at bedtime as needed.   Yes Historical Provider, MD  Insulin Glargine (LANTUS) 100 UNIT/ML Solostar Pen Inject 22 Units into the skin every morning. 05/20/13  Yes [name], MD  losartan (COZAAR) 50 MG tablet Take 1 tablet (50 mg total) by mouth daily. 06/27/13  Yes [name], MD  metFORMIN (GLUCOPHAGE) 500 MG tablet Take 1 tablet (500 mg total) by mouth 2 (two) times daily with a meal. 05/20/13  Yes [name], MD  niacin (NIASPAN) 500 MG CR tablet Take 1 tablet (500 mg total) by mouth at bedtime. 06/22/13  Yes [name], MD  omega-3 acid ethyl esters (LOVAZA) 1 G capsule Take 2 capsules (2 g total) by mouth 2 (two) times daily. 02/07/13  Yes [name], MD  omeprazole (PRILOSEC) 20 MG capsule Take 20 mg by mouth daily.   Yes Historical Provider, MD  ONE TOUCH ULTRA TEST test strip 1 each by Other route 2 (two) times daily. 07/06/12  Yes Historical Provider, MD  rosuvastatin (CRESTOR) 20 MG tablet Take 1 tablet (  20 mg total) by mouth daily. 08/16/12  Yes [name], MD  sitaGLIPtin (JANUVIA) 100 MG tablet Take 1 tablet (100 mg total) by mouth daily. 03/10/13  Yes [name], MD  traMADol (ULTRAM) 50 MG tablet Take 1 tablet (50 mg total) by mouth every 6 (six) hours as needed. 06/26/13  Yes Gregor Hams, MD  pioglitazone (ACTOS) 15 MG tablet Take 1 tablet (15 mg total) by mouth daily. 02/07/13   [name], MD  predniSONE (DELTASONE) 10 MG tablet Take 3 tablets (30 mg total) by mouth daily. 06/26/13   Gregor Hams, MD  sildenafil (VIAGRA) 100 MG tablet Take 0.5-1 tablets (50-100 mg total) by mouth daily as needed for erectile  dysfunction. 07/20/12   [name], MD   BP 163/91  Pulse 72  Temp(Src) 98.3 F (36.8 C) (Oral)  Resp 16  SpO2 100% Physical Exam  Constitutional: He is oriented to person, place, and time. He appears well-developed and well-nourished.  Appears uncomfortable on table, right leg straight  HENT:  Head: Normocephalic and atraumatic.  Mouth/Throat: Oropharynx is clear and moist.  Neck: Normal range of motion. Neck supple.  Cardiovascular: Normal rate, regular rhythm and normal heart sounds.   Pulmonary/Chest: Effort normal and breath sounds normal. He has no wheezes.  Abdominal: Soft. There is no tenderness.  Musculoskeletal: Normal range of motion. He exhibits no edema.  No TTP of spine. Mild TTP over right SI joint. No decreased sensation of right LE. Strength grossly intact. ROM of hip limited due to pain, but able to bear full weight.  Lymphadenopathy:    He has no cervical adenopathy.  Neurological: He is alert and oriented to person, place, and time. He has normal reflexes. He exhibits normal muscle tone. Coordination normal.  Skin: Skin is warm and dry.  Psychiatric: He has a normal mood and affect.    ED Course  Procedures (including critical care time) Labs Review Labs Reviewed - No data to display  Imaging Review No results found.  MDM   1. Sciatic nerve pain    History and exam most consistent with sciatic nerve pain. No known history. - Redose Prednisone for prolonged 12 day taper pak - Con't Tramadol as needed at night - NSAID prn during the day - Given information on sciatic nerve pain as well as rehab exercises - F/u with PCP as scheduled    Montez Morita, MD 06/30/13 2128

## 2013-06-30 NOTE — ED Notes (Addendum)
C/o pain R hip and down the outside of his R leg onset 4/22.  Works as an Clinical biochemist. No known injury.  Could not sleep Thurs., Fri., and Sat.  Came her on Sunday.  He was given prednisone and Tramadol with relief Mon. And Tues.  Tingling Wed. and worse today. Has appt. with Dr. Tonita Cong on 5/12.

## 2013-06-30 NOTE — Discharge Instructions (Signed)
Sciatica  with Rehab  The sciatic nerve runs from the back down the leg and is responsible for sensation and control of the muscles in the back (posterior) side of the thigh, lower leg, and foot. Sciatica is a condition that is characterized by inflammation of this nerve.   SYMPTOMS   · Signs of nerve damage, including numbness and/or weakness along the posterior side of the lower extremity.  · Pain in the back of the thigh that may also travel down the leg.  · Pain that worsens when sitting for long periods of time.  · Occasionally, pain in the back or buttock.  CAUSES   Inflammation of the sciatic nerve is the cause of sciatica. The inflammation is due to something irritating the nerve. Common sources of irritation include:  · Sitting for long periods of time.  · Direct trauma to the nerve.  · Arthritis of the spine.  · Herniated or ruptured disk.  · Slipping of the vertebrae (spondylolithesis)  · Pressure from soft tissues, such as muscles or ligament-like tissue (fascia).  RISK INCREASES WITH:  · Sports that place pressure or stress on the spine (football or weightlifting).  · Poor strength and flexibility.  · Failure to warm-up properly before activity.  · Family history of low back pain or disk disorders.  · Previous back injury or surgery.  · Poor body mechanics, especially when lifting, or poor posture.  PREVENTION   · Warm up and stretch properly before activity.  · Maintain physical fitness:  · Strength, flexibility, and endurance.  · Cardiovascular fitness.  · Learn and use proper technique, especially with posture and lifting. When possible, have coach correct improper technique.  · Avoid activities that place stress on the spine.  PROGNOSIS  If treated properly, then sciatica usually resolves within 6 weeks. However, occasionally surgery is necessary.   RELATED COMPLICATIONS   · Permanent nerve damage, including pain, numbness, tingle, or weakness.  · Chronic back pain.  · Risks of surgery: infection,  bleeding, nerve damage, or damage to surrounding tissues.  TREATMENT  Treatment initially involves resting from any activities that aggravate your symptoms. The use of ice and medication may help reduce pain and inflammation. The use of strengthening and stretching exercises may help reduce pain with activity. These exercises may be performed at home or with referral to a therapist. A therapist may recommend further treatments, such as transcutaneous electronic nerve stimulation (TENS) or ultrasound. Your caregiver may recommend corticosteroid injections to help reduce inflammation of the sciatic nerve. If symptoms persist despite non-surgical (conservative) treatment, then surgery may be recommended.  MEDICATION  · If pain medication is necessary, then nonsteroidal anti-inflammatory medications, such as aspirin and ibuprofen, or other minor pain relievers, such as acetaminophen, are often recommended.  · Do not take pain medication for 7 days before surgery.  · Prescription pain relievers may be given if deemed necessary by your caregiver. Use only as directed and only as much as you need.  · Ointments applied to the skin may be helpful.  · Corticosteroid injections may be given by your caregiver. These injections should be reserved for the most serious cases, because they may only be given a certain number of times.  HEAT AND COLD  · Cold treatment (icing) relieves pain and reduces inflammation. Cold treatment should be applied for 10 to 15 minutes every 2 to 3 hours for inflammation and pain and immediately after any activity that aggravates your symptoms. Use ice packs or   massage the area with a piece of ice (ice massage).  · Heat treatment may be used prior to performing the stretching and strengthening activities prescribed by your caregiver, physical therapist, or athletic trainer. Use a heat pack or soak the injury in warm water.  SEEK MEDICAL CARE IF:  · Treatment seems to offer no benefit, or the condition  worsens.  · Any medications produce adverse side effects.  EXERCISES   RANGE OF MOTION (ROM) AND STRETCHING EXERCISES - Sciatica  Most people with sciatic will find that their symptoms worsen with either excessive bending forward (flexion) or arching at the low back (extension). The exercises which will help resolve your symptoms will focus on the opposite motion. Your physician, physical therapist or athletic trainer will help you determine which exercises will be most helpful to resolve your low back pain. Do not complete any exercises without first consulting with your clinician. Discontinue any exercises which worsen your symptoms until you speak to your clinician. If you have pain, numbness or tingling which travels down into your buttocks, leg or foot, the goal of the therapy is for these symptoms to move closer to your back and eventually resolve. Occasionally, these leg symptoms will get better, but your low back pain may worsen; this is typically an indication of progress in your rehabilitation. Be certain to be very alert to any changes in your symptoms and the activities in which you participated in the 24 hours prior to the change. Sharing this information with your clinician will allow him/her to most efficiently treat your condition.  These exercises may help you when beginning to rehabilitate your injury. Your symptoms may resolve with or without further involvement from your physician, physical therapist or athletic trainer. While completing these exercises, remember:   · Restoring tissue flexibility helps normal motion to return to the joints. This allows healthier, less painful movement and activity.  · An effective stretch should be held for at least 30 seconds.  · A stretch should never be painful. You should only feel a gentle lengthening or release in the stretched tissue.  FLEXION RANGE OF MOTION AND STRETCHING EXERCISES:  STRETCH  Flexion, Single Knee to Chest   · Lie on a firm bed or floor  with both legs extended in front of you.  · Keeping one leg in contact with the floor, bring your opposite knee to your chest. Hold your leg in place by either grabbing behind your thigh or at your knee.  · Pull until you feel a gentle stretch in your low back. Hold __________ seconds.  · Slowly release your grasp and repeat the exercise with the opposite side.  Repeat __________ times. Complete this exercise __________ times per day.   STRETCH  Flexion, Double Knee to Chest  · Lie on a firm bed or floor with both legs extended in front of you.  · Keeping one leg in contact with the floor, bring your opposite knee to your chest.  · Tense your stomach muscles to support your back and then lift your other knee to your chest. Hold your legs in place by either grabbing behind your thighs or at your knees.  · Pull both knees toward your chest until you feel a gentle stretch in your low back. Hold __________ seconds.  · Tense your stomach muscles and slowly return one leg at a time to the floor.  Repeat __________ times. Complete this exercise __________ times per day.   STRETCH  Low Trunk Rotation   ·   Lie on a firm bed or floor. Keeping your legs in front of you, bend your knees so they are both pointed toward the ceiling and your feet are flat on the floor.  · Extend your arms out to the side. This will stabilize your upper body by keeping your shoulders in contact with the floor.  · Gently and slowly drop both knees together to one side until you feel a gentle stretch in your low back. Hold for __________ seconds.  · Tense your stomach muscles to support your low back as you bring your knees back to the starting position. Repeat the exercise to the other side.  Repeat __________ times. Complete this exercise __________ times per day   EXTENSION RANGE OF MOTION AND FLEXIBILITY EXERCISES:  STRETCH  Extension, Prone on Elbows  · Lie on your stomach on the floor, a bed will be too soft. Place your palms about shoulder  width apart and at the height of your head.  · Place your elbows under your shoulders. If this is too painful, stack pillows under your chest.  · Allow your body to relax so that your hips drop lower and make contact more completely with the floor.  · Hold this position for __________ seconds.  · Slowly return to lying flat on the floor.  Repeat __________ times. Complete this exercise __________ times per day.   RANGE OF MOTION  Extension, Prone Press Ups  · Lie on your stomach on the floor, a bed will be too soft. Place your palms about shoulder width apart and at the height of your head.  · Keeping your back as relaxed as possible, slowly straighten your elbows while keeping your hips on the floor. You may adjust the placement of your hands to maximize your comfort. As you gain motion, your hands will come more underneath your shoulders.  · Hold this position __________ seconds.  · Slowly return to lying flat on the floor.  Repeat __________ times. Complete this exercise __________ times per day.   STRENGTHENING EXERCISES - Sciatica   These exercises may help you when beginning to rehabilitate your injury. These exercises should be done near your "sweet spot." This is the neutral, low-back arch, somewhere between fully rounded and fully arched, that is your least painful position. When performed in this safe range of motion, these exercises can be used for people who have either a flexion or extension based injury. These exercises may resolve your symptoms with or without further involvement from your physician, physical therapist or athletic trainer. While completing these exercises, remember:   · Muscles can gain both the endurance and the strength needed for everyday activities through controlled exercises.  · Complete these exercises as instructed by your physician, physical therapist or athletic trainer. Progress with the resistance and repetition exercises only as your caregiver advises.  · You may  experience muscle soreness or fatigue, but the pain or discomfort you are trying to eliminate should never worsen during these exercises. If this pain does worsen, stop and make certain you are following the directions exactly. If the pain is still present after adjustments, discontinue the exercise until you can discuss the trouble with your clinician.  STRENGTHENING Deep Abdominals, Pelvic Tilt   · Lie on a firm bed or floor. Keeping your legs in front of you, bend your knees so they are both pointed toward the ceiling and your feet are flat on the floor.  · Tense your lower abdominal muscles to press   your low back into the floor. This motion will rotate your pelvis so that your tail bone is scooping upwards rather than pointing at your feet or into the floor.  · With a gentle tension and even breathing, hold this position for __________ seconds.  Repeat __________ times. Complete this exercise __________ times per day.   STRENGTHENING  Abdominals, Crunches   · Lie on a firm bed or floor. Keeping your legs in front of you, bend your knees so they are both pointed toward the ceiling and your feet are flat on the floor. Cross your arms over your chest.  · Slightly tip your chin down without bending your neck.  · Tense your abdominals and slowly lift your trunk high enough to just clear your shoulder blades. Lifting higher can put excessive stress on the low back and does not further strengthen your abdominal muscles.  · Control your return to the starting position.  Repeat __________ times. Complete this exercise __________ times per day.   STRENGTHENING  Quadruped, Opposite UE/LE Lift  · Assume a hands and knees position on a firm surface. Keep your hands under your shoulders and your knees under your hips. You may place padding under your knees for comfort.  · Find your neutral spine and gently tense your abdominal muscles so that you can maintain this position. Your shoulders and hips should form a rectangle that  is parallel with the floor and is not twisted.  · Keeping your trunk steady, lift your right hand no higher than your shoulder and then your left leg no higher than your hip. Make sure you are not holding your breath. Hold this position __________ seconds.  · Continuing to keep your abdominal muscles tense and your back steady, slowly return to your starting position. Repeat with the opposite arm and leg.  Repeat __________ times. Complete this exercise __________ times per day.   STRENGTHENING  Abdominals and Quadriceps, Straight Leg Raise   · Lie on a firm bed or floor with both legs extended in front of you.  · Keeping one leg in contact with the floor, bend the other knee so that your foot can rest flat on the floor.  · Find your neutral spine, and tense your abdominal muscles to maintain your spinal position throughout the exercise.  · Slowly lift your straight leg off the floor about 6 inches for a count of 15, making sure to not hold your breath.  · Still keeping your neutral spine, slowly lower your leg all the way to the floor.  Repeat this exercise with each leg __________ times. Complete this exercise __________ times per day.  POSTURE AND BODY MECHANICS CONSIDERATIONS - Sciatica  Keeping correct posture when sitting, standing or completing your activities will reduce the stress put on different body tissues, allowing injured tissues a chance to heal and limiting painful experiences. The following are general guidelines for improved posture. Your physician or physical therapist will provide you with any instructions specific to your needs. While reading these guidelines, remember:  · The exercises prescribed by your provider will help you have the flexibility and strength to maintain correct postures.  · The correct posture provides the optimal environment for your joints to work. All of your joints have less wear and tear when properly supported by a spine with good posture. This means you will  experience a healthier, less painful body.  · Correct posture must be practiced with all of your activities, especially prolonged sitting and   standing. Correct posture is as important when doing repetitive low-stress activities (typing) as it is when doing a single heavy-load activity (lifting).  RESTING POSITIONS  Consider which positions are most painful for you when choosing a resting position. If you have pain with flexion-based activities (sitting, bending, stooping, squatting), choose a position that allows you to rest in a less flexed posture. You would want to avoid curling into a fetal position on your side. If your pain worsens with extension-based activities (prolonged standing, working overhead), avoid resting in an extended position such as sleeping on your stomach. Most people will find more comfort when they rest with their spine in a more neutral position, neither too rounded nor too arched. Lying on a non-sagging bed on your side with a pillow between your knees, or on your back with a pillow under your knees will often provide some relief. Keep in mind, being in any one position for a prolonged period of time, no matter how correct your posture, can still lead to stiffness.  PROPER SITTING POSTURE  In order to minimize stress and discomfort on your spine, you must sit with correct posture Sitting with good posture should be effortless for a healthy body. Returning to good posture is a gradual process. Many people can work toward this most comfortably by using various supports until they have the flexibility and strength to maintain this posture on their own.  When sitting with proper posture, your ears will fall over your shoulders and your shoulders will fall over your hips. You should use the back of the chair to support your upper back. Your low back will be in a neutral position, just slightly arched. You may place a small pillow or folded towel at the base of your low back for support.   When  working at a desk, create an environment that supports good, upright posture. Without extra support, muscles fatigue and lead to excessive strain on joints and other tissues. Keep these recommendations in mind:  CHAIR:   · A chair should be able to slide under your desk when your back makes contact with the back of the chair. This allows you to work closely.  · The chair's height should allow your eyes to be level with the upper part of your monitor and your hands to be slightly lower than your elbows.  BODY POSITION  · Your feet should make contact with the floor. If this is not possible, use a foot rest.  · Keep your ears over your shoulders. This will reduce stress on your neck and low back.  INCORRECT SITTING POSTURES   · If you are feeling tired and unable to assume a healthy sitting posture, do not slouch or slump. This puts excessive strain on your back tissues, causing more damage and pain. Healthier options include:  · Using more support, like a lumbar pillow.  · Switching tasks to something that requires you to be upright or walking.  · Talking a brief walk.  · Lying down to rest in a neutral-spine position.  PROLONGED STANDING WHILE SLIGHTLY LEANING FORWARD   When completing a task that requires you to lean forward while standing in one place for a long time, place either foot up on a stationary 2-4 inch high object to help maintain the best posture. When both feet are on the ground, the low back tends to lose its slight inward curve. If this curve flattens (or becomes too large), then the back and your other joints   will experience too much stress, fatigue more quickly and can cause pain.   CORRECT STANDING POSTURES  Proper standing posture should be assumed with all daily activities, even if they only take a few moments, like when brushing your teeth. As in sitting, your ears should fall over your shoulders and your shoulders should fall over your hips. You should keep a slight tension in your abdominal  muscles to brace your spine. Your tailbone should point down to the ground, not behind your body, resulting in an over-extended swayback posture.   INCORRECT STANDING POSTURES   Common incorrect standing postures include a forward head, locked knees and/or an excessive swayback.  WALKING  Walk with an upright posture. Your ears, shoulders and hips should all line-up.  PROLONGED ACTIVITY IN A FLEXED POSITION  When completing a task that requires you to bend forward at your waist or lean over a low surface, try to find a way to stabilize 3 of 4 of your limbs. You can place a hand or elbow on your thigh or rest a knee on the surface you are reaching across. This will provide you more stability so that your muscles do not fatigue as quickly. By keeping your knees relaxed, or slightly bent, you will also reduce stress across your low back.  CORRECT LIFTING TECHNIQUES  DO :   · Assume a wide stance. This will provide you more stability and the opportunity to get as close as possible to the object which you are lifting.  · Tense your abdominals to brace your spine; then bend at the knees and hips. Keeping your back locked in a neutral-spine position, lift using your leg muscles. Lift with your legs, keeping your back straight.  · Test the weight of unknown objects before attempting to lift them.  · Try to keep your elbows locked down at your sides in order get the best strength from your shoulders when carrying an object.  · Always ask for help when lifting heavy or awkward objects.  INCORRECT LIFTING TECHNIQUES  DO NOT:   · Lock your knees when lifting, even if it is a small object.  · Bend and twist. Pivot at your feet or move your feet when needing to change directions.  · Assume that you cannot safely pick up a paperclip without proper posture.  Document Released: 02/17/2005 Document Revised: 05/12/2011 Document Reviewed: 06/01/2008  [employer]® Patient Information ©2014 [employer].

## 2013-07-01 NOTE — ED Provider Notes (Signed)
Medical screening examination/treatment/procedure(s) were performed by a resident physician and as supervising physician I was immediately available for consultation/collaboration.  Philipp Deputy, M.D.  Harden Mo, MD 07/01/13 0000

## 2013-07-11 ENCOUNTER — Ambulatory Visit (INDEPENDENT_AMBULATORY_CARE_PROVIDER_SITE_OTHER): Payer: 59 | Admitting: Family Medicine

## 2013-07-11 ENCOUNTER — Encounter: Payer: Self-pay | Admitting: Family Medicine

## 2013-07-11 VITALS — BP 110/60 | HR 80 | Temp 98.0°F | Resp 14 | Ht 67.0 in | Wt 178.0 lb

## 2013-07-11 DIAGNOSIS — G5600 Carpal tunnel syndrome, unspecified upper limb: Secondary | ICD-10-CM

## 2013-07-11 MED ORDER — PREDNISONE 20 MG PO TABS: ORAL_TABLET | ORAL | Status: DC

## 2013-07-11 NOTE — Progress Notes (Signed)
Subjective:    Patient ID: Micheal Donovan, male    DOB: 1950/12/19, 63 y.o.   MRN: [id number]  HPI Patient reports days and numbness and tingling in his right hand. The numbness and tingling involves the second third and fourth digit. It spares his 5 th digit.  His thumb is slightly numb. He denies any neck pain. He denies any symptoms of cervical radiculopathy. He denies any injury to his neck or to his arm his hand. He has a positive Tinel sign today. He has a positive Phalen's sign.  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 Past Medical History  Diagnosis Date  . Hypertension   . Hyperlipidemia   . Allergy   . ED (erectile dysfunction)   . Diabetes mellitus   . Cancer     Melanoma on Neck    2008  . BPH (benign prostatic hypertrophy)   . Carotid artery occlusion    Current Outpatient Prescriptions on File Prior to Visit  Medication Sig Dispense Refill  . aspirin 325 MG tablet Take 325 mg by mouth daily.      . Cholecalciferol (VITAMIN D-3) 5000 UNITS TABS Take by mouth.      . diphenhydramine-acetaminophen (TYLENOL PM) 25-500 MG TABS Take 1 tablet by mouth at bedtime as needed.      . Insulin Glargine (LANTUS) 100 UNIT/ML Solostar Pen Inject 22 Units into the skin every morning.  15 mL  3  . losartan (COZAAR) 50 MG tablet Take 1 tablet (50 mg total) by mouth daily.  30 tablet  6  . metFORMIN (GLUCOPHAGE) 500 MG tablet Take 1 tablet (500 mg total) by mouth 2 (two) times daily with a meal.  60 tablet  3  . niacin (NIASPAN) 500 MG CR tablet Take 1 tablet (500 mg total) by mouth at bedtime.  30 tablet  3  . omega-3 acid ethyl esters (LOVAZA) 1 G capsule Take 2 capsules (2 g total) by mouth 2 (two) times daily.  120 capsule  5  . omeprazole (PRILOSEC) 20 MG capsule Take 20 mg by mouth daily.      . ONE TOUCH ULTRA TEST test strip 1 each by Other route 2 (two) times daily.      . pioglitazone (ACTOS) 15 MG tablet Take 1 tablet (15 mg total) by mouth daily.  30 tablet  5    . predniSONE (STERAPRED UNI-PAK) 10 MG tablet Take by mouth daily. 12 day course  1 tablet  0  . rosuvastatin (CRESTOR) 20 MG tablet Take 1 tablet (20 mg total) by mouth daily.  30 tablet  5  . sildenafil (VIAGRA) 100 MG tablet Take 0.5-1 tablets (50-100 mg total) by mouth daily as needed for erectile dysfunction.  5 tablet  11  . sitaGLIPtin (JANUVIA) 100 MG tablet Take 1 tablet (100 mg total) by mouth daily.  30 tablet  3  . traMADol (ULTRAM) 50 MG tablet Take 1 tablet (50 mg total) by mouth every 6 (six) hours as needed.  15 tablet  0   No current facility-administered medications on file prior to visit.   Allergies  Allergen Reactions  . Niaspan [Niacin]     FLUSHING   History   Social History  . Marital Status: Single    Spouse Name: N/A    Number of Children: N/A  . Years of Education: N/A   Occupational History  . Not on file.   Social History Main Topics  . Smoking  status: Former Smoker    Quit date: 07/21/1990  . Smokeless tobacco: Never Used  . Alcohol Use: No  . Drug Use: No  . Sexual Activity: Not on file   Other Topics Concern  . Not on file   Social History Narrative  . No narrative on file      Review of Systems  All other systems reviewed and are negative.      Objective:   Physical Exam  Vitals reviewed. Constitutional: He is oriented to person, place, and time.  Cardiovascular: Normal rate and regular rhythm.   Pulmonary/Chest: Effort normal and breath sounds normal.  Neurological: He is alert and oriented to person, place, and time. He has normal reflexes. He displays normal reflexes. No cranial nerve deficit. He exhibits normal muscle tone. Coordination normal.   positive Tinel's sign, positive Phalen sign. Patient has slightly diminished grip strength in his right hand. There is no atrophy of thenar eminence.        Assessment & Plan:  1. Carpal tunnel syndrome I believe the patient has carpal tunnel syndrome. I see no evidence on  exam today for cervical radiculopathy or cva.  I offered the patient a cortisone injection in the wrist which he refused. He would like to try a prednisone taper pack first in a cock up wrist splint. If symptoms are not improving he will return immediately. - predniSONE (DELTASONE) 20 MG tablet; 3 tabs poqday 1-2, 2 tabs poqday 3-4, 1 tab poqday 5-6  Dispense: 12 tablet; Refill: 0

## 2013-07-19 ENCOUNTER — Other Ambulatory Visit: Payer: Self-pay | Admitting: Family Medicine

## 2013-07-19 MED ORDER — SITAGLIPTIN PHOSPHATE 100 MG PO TABS: 100.0000 mg | ORAL_TABLET | Freq: Every day | ORAL | Status: DC

## 2013-07-19 MED ORDER — ROSUVASTATIN CALCIUM 20 MG PO TABS: 20.0000 mg | ORAL_TABLET | Freq: Every day | ORAL | Status: DC

## 2013-07-19 NOTE — Telephone Encounter (Signed)
Rx Refilled  

## 2013-08-19 ENCOUNTER — Other Ambulatory Visit: Payer: Self-pay | Admitting: *Deleted

## 2013-08-19 MED ORDER — OMEGA-3-ACID ETHYL ESTERS 1 G PO CAPS: 2.0000 g | ORAL_CAPSULE | Freq: Two times a day (BID) | ORAL | Status: DC

## 2013-08-19 MED ORDER — PIOGLITAZONE HCL 15 MG PO TABS: 15.0000 mg | ORAL_TABLET | Freq: Every day | ORAL | Status: DC

## 2013-08-19 NOTE — Telephone Encounter (Signed)
Med refilled per protocol

## 2013-09-13 ENCOUNTER — Other Ambulatory Visit: Payer: Self-pay | Admitting: Family Medicine

## 2013-09-13 MED ORDER — SITAGLIPTIN PHOSPHATE 100 MG PO TABS: 100.0000 mg | ORAL_TABLET | Freq: Every day | ORAL | Status: DC

## 2013-09-13 MED ORDER — METFORMIN HCL 500 MG PO TABS: 500.0000 mg | ORAL_TABLET | Freq: Two times a day (BID) | ORAL | Status: DC

## 2013-09-13 MED ORDER — ROSUVASTATIN CALCIUM 20 MG PO TABS: 20.0000 mg | ORAL_TABLET | Freq: Every day | ORAL | Status: DC

## 2013-09-13 NOTE — Telephone Encounter (Signed)
Med sent to pharm 

## 2013-10-22 ENCOUNTER — Other Ambulatory Visit: Payer: Self-pay | Admitting: *Deleted

## 2013-10-22 MED ORDER — ROSUVASTATIN CALCIUM 20 MG PO TABS: 20.0000 mg | ORAL_TABLET | Freq: Every day | ORAL | Status: DC

## 2013-10-22 MED ORDER — GLUCOSE BLOOD VI STRP: ORAL_STRIP | Status: DC

## 2013-10-22 MED ORDER — SITAGLIPTIN PHOSPHATE 100 MG PO TABS: 100.0000 mg | ORAL_TABLET | Freq: Every day | ORAL | Status: DC

## 2013-10-22 NOTE — Telephone Encounter (Signed)
Received fax requesting refill on Januvia, Crestor, and his test strips.   Refill appropriate and filled per protocol.

## 2013-10-25 ENCOUNTER — Other Ambulatory Visit: Payer: Self-pay | Admitting: *Deleted

## 2013-10-25 MED ORDER — METFORMIN HCL 500 MG PO TABS: 500.0000 mg | ORAL_TABLET | Freq: Two times a day (BID) | ORAL | Status: DC

## 2013-10-25 NOTE — Telephone Encounter (Signed)
Received fax requesting refill on Metformin.   Refill appropriate and filled per protocol.

## 2013-12-05 ENCOUNTER — Ambulatory Visit (INDEPENDENT_AMBULATORY_CARE_PROVIDER_SITE_OTHER): Payer: 59 | Admitting: Family Medicine

## 2013-12-05 ENCOUNTER — Encounter: Payer: Self-pay | Admitting: Family Medicine

## 2013-12-05 VITALS — BP 126/78 | HR 94 | Temp 98.5°F | Resp 18 | Ht 67.0 in | Wt 186.0 lb

## 2013-12-05 DIAGNOSIS — M5431 Sciatica, right side: Secondary | ICD-10-CM

## 2013-12-05 MED ORDER — HYDROCODONE-ACETAMINOPHEN 5-325 MG PO TABS: 1.0000 | ORAL_TABLET | Freq: Four times a day (QID) | ORAL | Status: DC | PRN

## 2013-12-05 MED ORDER — PREDNISONE 20 MG PO TABS: ORAL_TABLET | ORAL | Status: DC

## 2013-12-05 NOTE — Progress Notes (Signed)
Subjective:    Patient ID: Micheal Donovan, male    DOB: 09-12-1950, 63 y.o.   MRN: [id number]  HPI For one month, the patient has had lower back pain radiating into his right hip and down the right lateral thigh to his knee. The pain is described as a deep ache and burning and tingling in his leg. He has a history of sciatica that was treated in the past an urgent care prednisone. He like to try the prednisone again. He is not use prednisone in over a year. Currently his blood sugars were well controlled at approximately 120. Having no episodes of hyperglycemia or hypoglycemia. He is having a difficult time sleeping due to the pain. He denies any symptoms of cauda equina syndrome or leg weakness. Past Medical History  Diagnosis Date  . Hypertension   . Hyperlipidemia   . Allergy   . ED (erectile dysfunction)   . Diabetes mellitus   . Cancer     Melanoma on Neck    2008  . BPH (benign prostatic hypertrophy)   . Carotid artery occlusion    Past Surgical History  Procedure Laterality Date  . Melanoma excision  2008    Left side of neck   Current Outpatient Prescriptions on File Prior to Visit  Medication Sig Dispense Refill  . aspirin 325 MG tablet Take 325 mg by mouth daily.      . Cholecalciferol (VITAMIN D-3) 5000 UNITS TABS Take by mouth.      . diphenhydramine-acetaminophen (TYLENOL PM) 25-500 MG TABS Take 1 tablet by mouth at bedtime as needed.      [name] glucose blood (ONE TOUCH ULTRA TEST) test strip Use to monitor FSBS 2x daily. DX: 250.00  100 each  11  . Insulin Glargine (LANTUS) 100 UNIT/ML Solostar Pen Inject 22 Units into the skin every morning.  15 mL  3  . losartan (COZAAR) 50 MG tablet Take 1 tablet (50 mg total) by mouth daily.  30 tablet  6  . metFORMIN (GLUCOPHAGE) 500 MG tablet Take 1 tablet (500 mg total) by mouth 2 (two) times daily with a meal.  60 tablet  6  . niacin (NIASPAN) 500 MG CR tablet Take 1 tablet (500 mg total) by mouth at bedtime.  30 tablet  3  .  omega-3 acid ethyl esters (LOVAZA) 1 G capsule Take 2 capsules (2 g total) by mouth 2 (two) times daily.  120 capsule  5  . omeprazole (PRILOSEC) 20 MG capsule Take 20 mg by mouth daily.      . pioglitazone (ACTOS) 15 MG tablet Take 1 tablet (15 mg total) by mouth daily.  30 tablet  5  . rosuvastatin (CRESTOR) 20 MG tablet Take 1 tablet (20 mg total) by mouth daily.  30 tablet  6  . sildenafil (VIAGRA) 100 MG tablet Take 0.5-1 tablets (50-100 mg total) by mouth daily as needed for erectile dysfunction.  5 tablet  11  . sitaGLIPtin (JANUVIA) 100 MG tablet Take 1 tablet (100 mg total) by mouth daily.  30 tablet  6   No current facility-administered medications on file prior to visit.   Allergies  Allergen Reactions  . Niaspan [Niacin]     FLUSHING   History   Social History  . Marital Status: Single    Spouse Name: N/A    Number of Children: N/A  . Years of Education: N/A   Occupational History  . Not on file.   Social History Main  Topics  . Smoking status: Former Smoker    Quit date: 07/21/1990  . Smokeless tobacco: Never Used  . Alcohol Use: No  . Drug Use: No  . Sexual Activity: Not on file   Other Topics Concern  . Not on file   Social History Narrative  . No narrative on file      Review of Systems  All other systems reviewed and are negative.      Objective:   Physical Exam  Vitals reviewed. Constitutional: He is oriented to person, place, and time.  Cardiovascular: Normal rate, regular rhythm and normal heart sounds.   Pulmonary/Chest: Effort normal and breath sounds normal.  Musculoskeletal: Normal range of motion.       Lumbar back: Normal. He exhibits normal range of motion, no tenderness and no bony tenderness.  Neurological: He is alert and oriented to person, place, and time. He has normal reflexes. He displays normal reflexes. No cranial nerve deficit. He exhibits normal muscle tone. Coordination normal.   patient has negative straight leg  raise.        Assessment & Plan:  Right sided sciatica - Plan: predniSONE (DELTASONE) 20 MG tablet, HYDROcodone-acetaminophen (NORCO) 5-325 MG per tablet  (This and taper pack for 6 days. I want to patient about hypoglycemia and gave him directions on how to manage hyperglycemia should it occur. Patient can also use Norco 5/325 one to 2 tablets every 6 hours as needed for severe pain. The patient back pain and leg pain is not improving, I would next proceed with an MRI of the lumbar spine.

## 2013-12-09 ENCOUNTER — Telehealth: Payer: Self-pay | Admitting: Family Medicine

## 2013-12-09 DIAGNOSIS — M5431 Sciatica, right side: Secondary | ICD-10-CM

## 2013-12-09 MED ORDER — PREDNISONE 20 MG PO TABS: ORAL_TABLET | ORAL | Status: DC

## 2013-12-09 NOTE — Telephone Encounter (Signed)
Med sent to pharm and pt aware 

## 2013-12-09 NOTE — Telephone Encounter (Signed)
[phone]  Pt is about out of the prednisone and it is not helping, last time he states that he was given 18 pills and this time he didn't get that many. Can he get a refill

## 2013-12-09 NOTE — Telephone Encounter (Signed)
He can try one additional taper pack but if not better suggest mri.

## 2014-01-05 ENCOUNTER — Telehealth: Payer: Self-pay | Admitting: *Deleted

## 2014-01-05 NOTE — Telephone Encounter (Signed)
Patient in office to request refill on insulin pen needles.   Call placed to pharmacy and verbal order given.

## 2014-01-18 ENCOUNTER — Other Ambulatory Visit: Payer: Self-pay | Admitting: Family Medicine

## 2014-01-18 MED ORDER — LOSARTAN POTASSIUM 50 MG PO TABS: 50.0000 mg | ORAL_TABLET | Freq: Every day | ORAL | Status: DC

## 2014-01-18 NOTE — Telephone Encounter (Signed)
Med sent to pharm 

## 2014-02-28 ENCOUNTER — Other Ambulatory Visit: Payer: Self-pay | Admitting: Family Medicine

## 2014-02-28 MED ORDER — INSULIN GLARGINE 100 UNIT/ML SOLOSTAR PEN: 22.0000 [IU] | PEN_INJECTOR | SUBCUTANEOUS | Status: DC

## 2014-02-28 NOTE — Telephone Encounter (Signed)
Med sent to pharm and pt needs ov and blood work - will send letter.

## 2014-03-01 ENCOUNTER — Other Ambulatory Visit: Payer: Self-pay | Admitting: Family Medicine

## 2014-03-01 MED ORDER — OMEGA-3-ACID ETHYL ESTERS 1 G PO CAPS: 2.0000 g | ORAL_CAPSULE | Freq: Two times a day (BID) | ORAL | Status: DC

## 2014-03-01 MED ORDER — PIOGLITAZONE HCL 15 MG PO TABS: 15.0000 mg | ORAL_TABLET | Freq: Every day | ORAL | Status: DC

## 2014-03-01 NOTE — Telephone Encounter (Signed)
meds sent to pharm - pt needs ov and BW - letter sent to pt as well as informing pharm to tell him that he needs ov and BW

## 2014-04-10 ENCOUNTER — Other Ambulatory Visit: Payer: Self-pay | Admitting: Family Medicine

## 2014-04-10 MED ORDER — OMEGA-3-ACID ETHYL ESTERS 1 G PO CAPS: 2.0000 g | ORAL_CAPSULE | Freq: Two times a day (BID) | ORAL | Status: DC

## 2014-04-10 MED ORDER — PIOGLITAZONE HCL 15 MG PO TABS: 15.0000 mg | ORAL_TABLET | Freq: Every day | ORAL | Status: DC

## 2014-04-10 NOTE — Telephone Encounter (Signed)
Med sent to pharm and pt requires ov before further refills

## 2014-05-09 ENCOUNTER — Other Ambulatory Visit: Payer: Self-pay | Admitting: Family Medicine

## 2014-05-09 MED ORDER — OMEGA-3-ACID ETHYL ESTERS 1 G PO CAPS: 2.0000 g | ORAL_CAPSULE | Freq: Two times a day (BID) | ORAL | Status: DC

## 2014-05-09 NOTE — Telephone Encounter (Signed)
Med sent to pharm 

## 2014-05-16 ENCOUNTER — Other Ambulatory Visit: Payer: Self-pay | Admitting: Family Medicine

## 2014-05-16 MED ORDER — INSULIN GLARGINE 100 UNIT/ML SOLOSTAR PEN: 22.0000 [IU] | PEN_INJECTOR | SUBCUTANEOUS | Status: DC

## 2014-05-16 NOTE — Telephone Encounter (Signed)
Med sent to pharm 

## 2014-05-25 ENCOUNTER — Other Ambulatory Visit: Payer: Self-pay | Admitting: *Deleted

## 2014-05-25 MED ORDER — ROSUVASTATIN CALCIUM 20 MG PO TABS: 20.0000 mg | ORAL_TABLET | Freq: Every day | ORAL | Status: DC

## 2014-05-25 MED ORDER — METFORMIN HCL 500 MG PO TABS: 500.0000 mg | ORAL_TABLET | Freq: Two times a day (BID) | ORAL | Status: DC

## 2014-05-25 MED ORDER — SITAGLIPTIN PHOSPHATE 100 MG PO TABS: 100.0000 mg | ORAL_TABLET | Freq: Every day | ORAL | Status: DC

## 2014-05-25 NOTE — Telephone Encounter (Signed)
Received fax requesting refill on Crestor, Januvia, and Metformin.   Prescription sent to pharmacy.

## 2014-06-07 ENCOUNTER — Other Ambulatory Visit: Payer: Self-pay | Admitting: *Deleted

## 2014-06-07 MED ORDER — PIOGLITAZONE HCL 15 MG PO TABS: 15.0000 mg | ORAL_TABLET | Freq: Every day | ORAL | Status: DC

## 2014-06-07 MED ORDER — OMEGA-3-ACID ETHYL ESTERS 1 G PO CAPS: 2.0000 g | ORAL_CAPSULE | Freq: Two times a day (BID) | ORAL | Status: DC

## 2014-06-07 NOTE — Telephone Encounter (Signed)
Received call from patient requesting refill on Actos and Omega 3.   Medication filled x1 with no refills.   Advised that patient requires office visit before any further refills can be given.   No appointment scheduled at this time. Letter sent.

## 2014-06-19 ENCOUNTER — Other Ambulatory Visit: Payer: 59

## 2014-06-19 DIAGNOSIS — E785 Hyperlipidemia, unspecified: Secondary | ICD-10-CM

## 2014-06-19 DIAGNOSIS — Z0000 Encounter for general adult medical examination without abnormal findings: Secondary | ICD-10-CM

## 2014-06-19 DIAGNOSIS — I10 Essential (primary) hypertension: Secondary | ICD-10-CM

## 2014-06-19 DIAGNOSIS — Z125 Encounter for screening for malignant neoplasm of prostate: Secondary | ICD-10-CM

## 2014-06-19 DIAGNOSIS — E119 Type 2 diabetes mellitus without complications: Secondary | ICD-10-CM

## 2014-06-19 DIAGNOSIS — Z79899 Other long term (current) drug therapy: Secondary | ICD-10-CM

## 2014-06-19 LAB — TSH: TSH: 1.131 u[IU]/mL (ref 0.350–4.500)

## 2014-06-19 LAB — LIPID PANEL
CHOLESTEROL: 133 mg/dL (ref 0–200)
HDL: 17 mg/dL — ABNORMAL LOW (ref >=40)
LDL Cholesterol: 91 mg/dL (ref 0–99)
Total CHOL/HDL Ratio: 7.8 Ratio
Triglycerides: 125 mg/dL (ref <150)
VLDL: 25 mg/dL (ref 0–40)

## 2014-06-19 LAB — CBC WITH DIFFERENTIAL/PLATELET
BASOS PCT: 0 % (ref 0–1)
Basophils Absolute: 0 10*3/uL (ref 0.0–0.1)
EOS ABS: 0.1 10*3/uL (ref 0.0–0.7)
Eosinophils Relative: 2 % (ref 0–5)
HEMATOCRIT: 45.5 % (ref 39.0–52.0)
Hemoglobin: 15.1 g/dL (ref 13.0–17.0)
Lymphocytes Relative: 28 % (ref 12–46)
Lymphs Abs: 1.6 10*3/uL (ref 0.7–4.0)
MCH: 29.8 pg (ref 26.0–34.0)
MCHC: 33.2 g/dL (ref 30.0–36.0)
MCV: 89.7 fL (ref 78.0–100.0)
MPV: 9.4 fL (ref 8.6–12.4)
Monocytes Absolute: 0.7 10*3/uL (ref 0.1–1.0)
Monocytes Relative: 13 % — ABNORMAL HIGH (ref 3–12)
NEUTROS ABS: 3.2 10*3/uL (ref 1.7–7.7)
Neutrophils Relative %: 57 % (ref 43–77)
Platelets: 252 10*3/uL (ref 150–400)
RBC: 5.07 MIL/uL (ref 4.22–5.81)
RDW: 14.6 % (ref 11.5–15.5)
WBC: 5.6 10*3/uL (ref 4.0–10.5)

## 2014-06-19 LAB — COMPLETE METABOLIC PANEL WITH GFR
ALT: 22 U/L (ref 0–53)
AST: 26 U/L (ref 0–37)
Albumin: 4.6 g/dL (ref 3.5–5.2)
Alkaline Phosphatase: 80 U/L (ref 39–117)
BILIRUBIN TOTAL: 0.5 mg/dL (ref 0.2–1.2)
BUN: 20 mg/dL (ref 6–23)
CHLORIDE: 101 meq/L (ref 96–112)
CO2: 28 meq/L (ref 19–32)
CREATININE: 0.89 mg/dL (ref 0.50–1.35)
Calcium: 9.7 mg/dL (ref 8.4–10.5)
GFR, Est African American: >89 mL/min
GFR, Est Non African American: >89 mL/min
Glucose, Bld: 126 mg/dL — ABNORMAL HIGH (ref 70–99)
Potassium: 4.7 mEq/L (ref 3.5–5.3)
Sodium: 139 mEq/L (ref 135–145)
TOTAL PROTEIN: 7.1 g/dL (ref 6.0–8.3)

## 2014-06-20 LAB — PSA: PSA: 0.88 ng/mL (ref <=4.00)

## 2014-06-20 LAB — HEMOGLOBIN A1C
Hgb A1c MFr Bld: 7.4 % — ABNORMAL HIGH (ref <5.7)
Mean Plasma Glucose: 166 mg/dL — ABNORMAL HIGH (ref <117)

## 2014-07-03 ENCOUNTER — Encounter: Payer: Self-pay | Admitting: Family Medicine

## 2014-07-03 ENCOUNTER — Ambulatory Visit (INDEPENDENT_AMBULATORY_CARE_PROVIDER_SITE_OTHER): Payer: 59 | Admitting: Family Medicine

## 2014-07-03 VITALS — BP 130/74 | HR 76 | Temp 98.2°F | Resp 18 | Ht 67.0 in | Wt 187.0 lb

## 2014-07-03 DIAGNOSIS — Z794 Long term (current) use of insulin: Secondary | ICD-10-CM | POA: Diagnosis not present

## 2014-07-03 DIAGNOSIS — Z0000 Encounter for general adult medical examination without abnormal findings: Secondary | ICD-10-CM

## 2014-07-03 DIAGNOSIS — E119 Type 2 diabetes mellitus without complications: Secondary | ICD-10-CM

## 2014-07-03 DIAGNOSIS — Z1211 Encounter for screening for malignant neoplasm of colon: Secondary | ICD-10-CM

## 2014-07-03 DIAGNOSIS — I10 Essential (primary) hypertension: Secondary | ICD-10-CM | POA: Diagnosis not present

## 2014-07-03 DIAGNOSIS — E785 Hyperlipidemia, unspecified: Secondary | ICD-10-CM

## 2014-07-03 DIAGNOSIS — IMO0001 Reserved for inherently not codable concepts without codable children: Secondary | ICD-10-CM

## 2014-07-03 MED ORDER — NIACIN ER (ANTIHYPERLIPIDEMIC) 1000 MG PO TBCR: 1000.0000 mg | EXTENDED_RELEASE_TABLET | Freq: Every day | ORAL | Status: DC

## 2014-07-03 NOTE — Progress Notes (Signed)
Subjective:    Patient ID: Micheal Donovan, male    DOB: 07-11-1950, 64 y.o.   MRN: [id number]  HPI  Patient is here today for complete physical exam. Unfortunately his company has been bought out. There is a potential chance for layoffs. Due to this the patient's diet has suffered recently. His hemoglobin A1c has risen from 6.9-7.4. Patient admits that he knows why his sugars have risen and he would like the opportunity to try to correct this through diet. I did give the patient option of increasing his Actos but he would like to try lifestyle changes first. He has never had a colonoscopy and he refuses a colonoscopy but he will consent to fecal occult blood cards. He is due for prostate exam today. Pneumovax 23 is up-to-date. Offered the patient Prevnar 13 but he deferred it at the present time. He also defer the shingles vaccine. He would like to get his tetanus shot at work. His most recent lab work as listed below and is also significant for severe dyslipidemia Lab on 06/19/2014  Component Date Value Ref Range Status  . Sodium 06/19/2014 139  135 - 145 mEq/L Final  . Potassium 06/19/2014 4.7  3.5 - 5.3 mEq/L Final  . Chloride 06/19/2014 101  96 - 112 mEq/L Final  . CO2 06/19/2014 28  19 - 32 mEq/L Final  . Glucose, Bld 06/19/2014 126* 70 - 99 mg/dL Final  . BUN 06/19/2014 20  6 - 23 mg/dL Final  . Creat 06/19/2014 0.89  0.50 - 1.35 mg/dL Final  . Total Bilirubin 06/19/2014 0.5  0.2 - 1.2 mg/dL Final  . Alkaline Phosphatase 06/19/2014 80  39 - 117 U/L Final  . AST 06/19/2014 26  0 - 37 U/L Final  . ALT 06/19/2014 22  0 - 53 U/L Final  . Total Protein 06/19/2014 7.1  6.0 - 8.3 g/dL Final  . Albumin 06/19/2014 4.6  3.5 - 5.2 g/dL Final  . Calcium 06/19/2014 9.7  8.4 - 10.5 mg/dL Final  . GFR, Est African American 06/19/2014 >89   Final  . GFR, Est Non African American 06/19/2014 >89   Final   Comment:   The estimated GFR is a calculation valid for adults (>=17 years old) that uses the  CKD-EPI algorithm to adjust for age and sex. It is   not to be used for children, pregnant women, hospitalized patients,    patients on dialysis, or with rapidly changing kidney function. According to the NKDEP, eGFR >89 is normal, 60-89 shows mild impairment, 30-59 shows moderate impairment, 15-29 shows severe impairment and <15 is ESRD.     . TSH 06/19/2014 1.131  0.350 - 4.500 uIU/mL Final  . Cholesterol 06/19/2014 133  0 - 200 mg/dL Final   Comment: ATP III Classification:       < 200        mg/dL        Desirable      200 - 239     mg/dL        Borderline High      >= 240        mg/dL        High     . Triglycerides 06/19/2014 125  <150 mg/dL Final  . HDL 06/19/2014 17* >=40 mg/dL Final   ** Please note change in reference range(s). **  . Total CHOL/HDL Ratio 06/19/2014 7.8   Final  . VLDL 06/19/2014 25  0 - 40 mg/dL Final  .  LDL Cholesterol 06/19/2014 91  0 - 99 mg/dL Final   Comment:   Total Cholesterol/HDL Ratio:CHD Risk                        Coronary Heart Disease Risk Table                                        Men       Women          1/2 Average Risk              3.4        3.3              Average Risk              5.0        4.4           2X Average Risk              9.6        7.1           3X Average Risk             23.4       11.0 Use the calculated Patient Ratio above and the CHD Risk table  to determine the patient's CHD Risk. ATP III Classification (LDL):       < 100        mg/dL         Optimal      100 - 129     mg/dL         Near or Above Optimal      130 - 159     mg/dL         Borderline High      160 - 189     mg/dL         High       > 190        mg/dL         Very High     . WBC 06/19/2014 5.6  4.0 - 10.5 K/uL Final  . RBC 06/19/2014 5.07  4.22 - 5.81 MIL/uL Final  . Hemoglobin 06/19/2014 15.1  13.0 - 17.0 g/dL Final  . HCT 06/19/2014 45.5  39.0 - 52.0 % Final  . MCV 06/19/2014 89.7  78.0 - 100.0 fL Final  . MCH 06/19/2014 29.8  26.0 - 34.0  pg Final  . MCHC 06/19/2014 33.2  30.0 - 36.0 g/dL Final  . RDW 06/19/2014 14.6  11.5 - 15.5 % Final  . Platelets 06/19/2014 252  150 - 400 K/uL Final  . MPV 06/19/2014 9.4  8.6 - 12.4 fL Final  . Neutrophils Relative % 06/19/2014 57  43 - 77 % Final  . Neutro Abs 06/19/2014 3.2  1.7 - 7.7 K/uL Final  . Lymphocytes Relative 06/19/2014 28  12 - 46 % Final  . Lymphs Abs 06/19/2014 1.6  0.7 - 4.0 K/uL Final  . Monocytes Relative 06/19/2014 13* 3 - 12 % Final  . Monocytes Absolute 06/19/2014 0.7  0.1 - 1.0 K/uL Final  . Eosinophils Relative 06/19/2014 2  0 - 5 % Final  . Eosinophils Absolute 06/19/2014 0.1  0.0 - 0.7 K/uL Final  . Basophils Relative 06/19/2014 0  0 - 1 % Final  .  Basophils Absolute 06/19/2014 0.0  0.0 - 0.1 K/uL Final  . Smear Review 06/19/2014 Criteria for review not met   Final  . Hgb A1c MFr Bld 06/19/2014 7.4* <5.7 % Final   Comment:                                                                        According to the ADA Clinical Practice Recommendations for 2011, when HbA1c is used as a screening test:     >=6.5%   Diagnostic of Diabetes Mellitus            (if abnormal result is confirmed)   5.7-6.4%   Increased risk of developing Diabetes Mellitus   References:Diagnosis and Classification of Diabetes Mellitus,Diabetes GYFV,4944,96(PRFFM 1):S62-S69 and Standards of Medical Care in         Diabetes - 2011,Diabetes Care,2011,34 (Suppl 1):S11-S61.     . Mean Plasma Glucose 06/19/2014 166* <117 mg/dL Final  . PSA 06/19/2014 0.88  <=4.00 ng/mL Final   Comment: Test Methodology: ECLIA PSA (Electrochemiluminescence Immunoassay)   For PSA values from 2.5-4.0, particularly in younger men <30 years old, the AUA and NCCN suggest testing for % Free PSA (3515) and evaluation of the rate of increase in PSA (PSA velocity).    Past Medical History  Diagnosis Date  . Hypertension   . Hyperlipidemia   . Allergy   . ED (erectile dysfunction)   . Diabetes mellitus   .  Cancer     Melanoma on Neck    2008  . BPH (benign prostatic hypertrophy)   . Carotid artery occlusion    Past Surgical History  Procedure Laterality Date  . Melanoma excision  2008    Left side of neck   Current Outpatient Prescriptions on File Prior to Visit  Medication Sig Dispense Refill  . aspirin 325 MG tablet Take 325 mg by mouth daily.    . diphenhydramine-acetaminophen (TYLENOL PM) 25-500 MG TABS Take 1 tablet by mouth at bedtime as needed.    [name] glucose blood (ONE TOUCH ULTRA TEST) test strip Use to monitor FSBS 2x daily. DX: 250.00 100 each 11  . Insulin Glargine (LANTUS) 100 UNIT/ML Solostar Pen Inject 22 Units into the skin every morning. 15 mL 0  . losartan (COZAAR) 50 MG tablet Take 1 tablet (50 mg total) by mouth daily. 30 tablet 6  . metFORMIN (GLUCOPHAGE) 500 MG tablet Take 1 tablet (500 mg total) by mouth 2 (two) times daily with a meal. 60 tablet 6  . omega-3 acid ethyl esters (LOVAZA) 1 G capsule Take 2 capsules (2 g total) by mouth 2 (two) times daily. 120 capsule 0  . omeprazole (PRILOSEC) 20 MG capsule Take 20 mg by mouth daily.    . pioglitazone (ACTOS) 15 MG tablet Take 1 tablet (15 mg total) by mouth daily. 30 tablet 0  . rosuvastatin (CRESTOR) 20 MG tablet Take 1 tablet (20 mg total) by mouth daily. 30 tablet 6  . sildenafil (VIAGRA) 100 MG tablet Take 0.5-1 tablets (50-100 mg total) by mouth daily as needed for erectile dysfunction. 5 tablet 11  . sitaGLIPtin (JANUVIA) 100 MG tablet Take 1 tablet (100 mg total) by mouth daily. 30 tablet 6   No current  facility-administered medications on file prior to visit.   Allergies  Allergen Reactions  . Niaspan [Niacin]     FLUSHING   History   Social History  . Marital Status: Single    Spouse Name: N/A  . Number of Children: N/A  . Years of Education: N/A   Occupational History  . Not on file.   Social History Main Topics  . Smoking status: Former Smoker    Quit date: 07/21/1990  . Smokeless tobacco:  Never Used  . Alcohol Use: No  . Drug Use: No  . Sexual Activity: Not on file   Other Topics Concern  . Not on file   Social History Narrative   Family History  Problem Relation Age of Onset  . COPD Mother   . Heart disease Father   . Heart disease Brother     MI at age 62     Review of Systems  All other systems reviewed and are negative.      Objective:   Physical Exam  Constitutional: He is oriented to person, place, and time. He appears well-developed and well-nourished. No distress.  HENT:  Head: Normocephalic and atraumatic.  Right Ear: External ear normal.  Left Ear: External ear normal.  Nose: Nose normal.  Mouth/Throat: Oropharynx is clear and moist. No oropharyngeal exudate.  Eyes: Conjunctivae and EOM are normal. Pupils are equal, round, and reactive to light. Right eye exhibits no discharge. Left eye exhibits no discharge. No scleral icterus.  Neck: Normal range of motion. Neck supple. No JVD present. No tracheal deviation present. No thyromegaly present.  Cardiovascular: Normal rate, regular rhythm, normal heart sounds and intact distal pulses.  Exam reveals no gallop and no friction rub.   No murmur heard. Pulmonary/Chest: Effort normal and breath sounds normal. No stridor. No respiratory distress. He has no wheezes. He has no rales. He exhibits no tenderness.  Abdominal: Soft. Bowel sounds are normal. He exhibits no distension and no mass. There is no tenderness. There is no rebound and no guarding.  Genitourinary: Rectum normal, prostate normal and penis normal.  Musculoskeletal: Normal range of motion. He exhibits no edema or tenderness.  Lymphadenopathy:    He has no cervical adenopathy.  Neurological: He is alert and oriented to person, place, and time. He has normal reflexes. He displays normal reflexes. No cranial nerve deficit. He exhibits normal muscle tone. Coordination normal. . Skin: Skin is warm. No rash noted. He is not diaphoretic. No erythema.  No pallor.  Psychiatric: He has a normal mood and affect. His behavior is normal. Judgment and thought content normal.  Vitals reviewed.         Assessment & Plan:  Dyslipidemia - Plan: niacin (NIASPAN) 1000 MG CR tablet  Special screening for malignant neoplasms, colon - Plan: Fecal occult blood, imunochemical, Fecal occult blood, imunochemical, Fecal occult blood, imunochemical  Routine general medical examination at a health care facility  IDDM (insulin dependent diabetes mellitus)  Benign essential HTN  Patient's blood pressures well controlled. He would like to change his hemoglobin A1c through therapeutic lifestyle changes first. Recheck in 36 months. He declines Prevnar 13, Zostavax, and a tetanus shot today. He declines a colonoscopy but he will consent to fecal occult blood cards 3. He also consents to resume Niaspan 1000 mg by mouth daily given the severity of his dyslipidemia. Recheck in 3-6 months.

## 2014-07-12 ENCOUNTER — Other Ambulatory Visit: Payer: Self-pay | Admitting: Family Medicine

## 2014-07-12 MED ORDER — PIOGLITAZONE HCL 15 MG PO TABS: 15.0000 mg | ORAL_TABLET | Freq: Every day | ORAL | Status: DC

## 2014-07-12 MED ORDER — OMEGA-3-ACID ETHYL ESTERS 1 G PO CAPS: 2.0000 g | ORAL_CAPSULE | Freq: Two times a day (BID) | ORAL | Status: DC

## 2014-07-25 ENCOUNTER — Telehealth: Payer: Self-pay | Admitting: Family Medicine

## 2014-07-25 MED ORDER — INSULIN GLARGINE 100 UNIT/ML SOLOSTAR PEN: 22.0000 [IU] | PEN_INJECTOR | SUBCUTANEOUS | Status: DC

## 2014-07-25 NOTE — Telephone Encounter (Signed)
Medication refilled per protocol. 

## 2014-08-31 ENCOUNTER — Other Ambulatory Visit: Payer: Self-pay | Admitting: Family Medicine

## 2014-08-31 MED ORDER — LOSARTAN POTASSIUM 50 MG PO TABS: 50.0000 mg | ORAL_TABLET | Freq: Every day | ORAL | Status: DC

## 2014-08-31 NOTE — Telephone Encounter (Signed)
Medication refilled per protocol. 

## 2014-11-08 ENCOUNTER — Other Ambulatory Visit: Payer: Self-pay | Admitting: Family Medicine

## 2014-11-08 MED ORDER — GLUCOSE BLOOD VI STRP: ORAL_STRIP | Status: DC

## 2014-11-08 NOTE — Telephone Encounter (Signed)
Medication called/sent to requested pharmacy  

## 2014-11-13 ENCOUNTER — Other Ambulatory Visit: Payer: Self-pay | Admitting: Family Medicine

## 2014-11-13 MED ORDER — PIOGLITAZONE HCL 15 MG PO TABS: 15.0000 mg | ORAL_TABLET | Freq: Every day | ORAL | Status: DC

## 2015-01-02 ENCOUNTER — Other Ambulatory Visit: Payer: Self-pay | Admitting: Family Medicine

## 2015-01-02 DIAGNOSIS — E785 Hyperlipidemia, unspecified: Secondary | ICD-10-CM

## 2015-01-02 MED ORDER — OMEGA-3-ACID ETHYL ESTERS 1 G PO CAPS: 2.0000 g | ORAL_CAPSULE | Freq: Two times a day (BID) | ORAL | Status: DC

## 2015-01-02 MED ORDER — SITAGLIPTIN PHOSPHATE 100 MG PO TABS: 100.0000 mg | ORAL_TABLET | Freq: Every day | ORAL | Status: DC

## 2015-01-02 MED ORDER — NIACIN ER (ANTIHYPERLIPIDEMIC) 1000 MG PO TBCR
1000.0000 mg | EXTENDED_RELEASE_TABLET | Freq: Every day | ORAL | Status: DC
Start: 2015-01-02 — End: 2015-02-06

## 2015-01-02 MED ORDER — METFORMIN HCL 500 MG PO TABS: 500.0000 mg | ORAL_TABLET | Freq: Two times a day (BID) | ORAL | Status: DC

## 2015-01-02 MED ORDER — ROSUVASTATIN CALCIUM 20 MG PO TABS: 20.0000 mg | ORAL_TABLET | Freq: Every day | ORAL | Status: DC

## 2015-01-02 NOTE — Telephone Encounter (Signed)
Medication called/sent to requested pharmacy x 1 month - pr needs ov and BW for 6 month med ck

## 2015-02-06 ENCOUNTER — Other Ambulatory Visit: Payer: Self-pay | Admitting: Family Medicine

## 2015-02-06 DIAGNOSIS — E785 Hyperlipidemia, unspecified: Secondary | ICD-10-CM

## 2015-02-06 MED ORDER — SITAGLIPTIN PHOSPHATE 100 MG PO TABS: 100.0000 mg | ORAL_TABLET | Freq: Every day | ORAL | Status: DC

## 2015-02-06 MED ORDER — ROSUVASTATIN CALCIUM 20 MG PO TABS: 20.0000 mg | ORAL_TABLET | Freq: Every day | ORAL | Status: DC

## 2015-02-06 MED ORDER — OMEGA-3-ACID ETHYL ESTERS 1 G PO CAPS: 2.0000 g | ORAL_CAPSULE | Freq: Two times a day (BID) | ORAL | Status: DC

## 2015-02-06 MED ORDER — METFORMIN HCL 500 MG PO TABS: 500.0000 mg | ORAL_TABLET | Freq: Two times a day (BID) | ORAL | Status: DC

## 2015-02-06 MED ORDER — NIACIN ER (ANTIHYPERLIPIDEMIC) 1000 MG PO TBCR: 1000.0000 mg | EXTENDED_RELEASE_TABLET | Freq: Every day | ORAL | Status: DC

## 2015-02-06 NOTE — Telephone Encounter (Signed)
Pharmacist calling.  Pt at pharmacy, says refills were sent two days ago.  No refills received here.  Told pharmacist to tell patient due for office visit and approve 1 more month of his regular medications

## 2015-03-07 ENCOUNTER — Other Ambulatory Visit: Payer: Self-pay | Admitting: *Deleted

## 2015-03-07 MED ORDER — LOSARTAN POTASSIUM 50 MG PO TABS: 50.0000 mg | ORAL_TABLET | Freq: Every day | ORAL | Status: DC

## 2015-03-07 NOTE — Telephone Encounter (Signed)
Received fax requesting refill on Losartan.   Refill appropriate and filled per protocol.

## 2015-03-13 ENCOUNTER — Encounter: Payer: Self-pay | Admitting: Family Medicine

## 2015-03-13 ENCOUNTER — Ambulatory Visit (INDEPENDENT_AMBULATORY_CARE_PROVIDER_SITE_OTHER): Payer: 59 | Admitting: Family Medicine

## 2015-03-13 VITALS — BP 120/70 | HR 82 | Temp 98.5°F | Resp 20 | Ht 67.0 in | Wt 183.0 lb

## 2015-03-13 DIAGNOSIS — J208 Acute bronchitis due to other specified organisms: Secondary | ICD-10-CM | POA: Diagnosis not present

## 2015-03-13 MED ORDER — AZITHROMYCIN 250 MG PO TABS: ORAL_TABLET | ORAL | Status: DC

## 2015-03-13 MED ORDER — HYDROCODONE-HOMATROPINE 5-1.5 MG/5ML PO SYRP: 5.0000 mL | ORAL_SOLUTION | Freq: Three times a day (TID) | ORAL | Status: DC | PRN

## 2015-03-13 NOTE — Progress Notes (Signed)
Subjective:    Patient ID: Micheal Donovan, male    DOB: 06-16-1950, 66 y.o.   MRN: [id number]  HPI His symptoms began 10 days ago. He has a terrible cough productive of green purulent sputum. He reports mild shortness of breath and mild pleurisy. He denies fevers. He denies hemoptysis. He has significant chest congestion. He denies severe rhinorrhea or ear pain or sinus pain or sore throat. Past Medical History  Diagnosis Date  . Hypertension   . Hyperlipidemia   . Allergy   . ED (erectile dysfunction)   . Diabetes mellitus   . Cancer     Melanoma on Neck    2008  . BPH (benign prostatic hypertrophy)   . Carotid artery occlusion    Past Surgical History  Procedure Laterality Date  . Melanoma excision  2008    Left side of neck   Current Outpatient Prescriptions on File Prior to Visit  Medication Sig Dispense Refill  . aspirin 325 MG tablet Take 325 mg by mouth daily.    . diphenhydramine-acetaminophen (TYLENOL PM) 25-500 MG TABS Take 1 tablet by mouth at bedtime as needed.    [name] glucose blood (ONE TOUCH ULTRA TEST) test strip Use to monitor FSBS 2x daily. DX: E11.9 100 each 11  . Insulin Glargine (LANTUS) 100 UNIT/ML Solostar Pen Inject 22 Units into the skin every morning. 15 mL 6  . losartan (COZAAR) 50 MG tablet Take 1 tablet (50 mg total) by mouth daily. 30 tablet 5  . metFORMIN (GLUCOPHAGE) 500 MG tablet Take 1 tablet (500 mg total) by mouth 2 (two) times daily with a meal. (Needs office visit and labs before further refills) 60 tablet 0  . niacin (NIASPAN) 1000 MG CR tablet Take 1 tablet (1,000 mg total) by mouth at bedtime. (Needs office visit and labs before further refills) 30 tablet 0  . omega-3 acid ethyl esters (LOVAZA) 1 G capsule Take 2 capsules (2 g total) by mouth 2 (two) times daily. 120 capsule 0  . omeprazole (PRILOSEC) 20 MG capsule Take 20 mg by mouth daily.    . pioglitazone (ACTOS) 15 MG tablet Take 1 tablet (15 mg total) by mouth daily. 30 tablet 3  .  rosuvastatin (CRESTOR) 20 MG tablet Take 1 tablet (20 mg total) by mouth daily. 30 tablet 0  . sildenafil (VIAGRA) 100 MG tablet Take 0.5-1 tablets (50-100 mg total) by mouth daily as needed for erectile dysfunction. 5 tablet 11  . sitaGLIPtin (JANUVIA) 100 MG tablet Take 1 tablet (100 mg total) by mouth daily. (Needs office visit and labs before further refills) 30 tablet 0   No current facility-administered medications on file prior to visit.   Allergies  Allergen Reactions  . Niaspan [Niacin]     FLUSHING   Social History   Social History  . Marital Status: Single    Spouse Name: N/A  . Number of Children: N/A  . Years of Education: N/A   Occupational History  . Not on file.   Social History Main Topics  . Smoking status: Former Smoker    Quit date: 07/21/1990  . Smokeless tobacco: Never Used  . Alcohol Use: No  . Drug Use: No  . Sexual Activity: Not on file   Other Topics Concern  . Not on file   Social History Narrative     Review of Systems  All other systems reviewed and are negative.      Objective:   Physical Exam  Constitutional:  He appears well-developed and well-nourished. No distress.  HENT:  Right Ear: External ear normal.  Left Ear: External ear normal.  Nose: Mucosal edema and rhinorrhea present.  Mouth/Throat: Oropharynx is clear and moist. No oropharyngeal exudate.  Eyes: Conjunctivae are normal.  Neck: Neck supple.  Cardiovascular: Normal rate, regular rhythm and normal heart sounds.   No murmur heard. Pulmonary/Chest: Effort normal. No respiratory distress. He has no wheezes. He has rales.  Abdominal: Soft. Bowel sounds are normal.  Lymphadenopathy:    He has no cervical adenopathy.  Skin: He is not diaphoretic.  Vitals reviewed.         Assessment & Plan:  Acute bronchitis due to other specified organisms - Plan: azithromycin (ZITHROMAX) 250 MG tablet, HYDROcodone-homatropine (HYCODAN) 5-1.5 MG/5ML syrup  Symptoms are  consistent with bronchitis. I recommended Mucinex. Should symptoms worsen or he develop fever I would like him to start taking a Z-Pak. I did give him Hycodan 1 teaspoon every 8 hours as needed for cough.

## 2015-03-19 ENCOUNTER — Encounter: Payer: Self-pay | Admitting: Family Medicine

## 2015-03-19 ENCOUNTER — Other Ambulatory Visit: Payer: Self-pay | Admitting: Family Medicine

## 2015-03-19 MED ORDER — PIOGLITAZONE HCL 15 MG PO TABS: 15.0000 mg | ORAL_TABLET | Freq: Every day | ORAL | Status: DC

## 2015-03-19 NOTE — Telephone Encounter (Signed)
Medication refill for one time only.  Patient needs to be seen.  Letter sent for patient to call and schedule 

## 2015-07-10 ENCOUNTER — Other Ambulatory Visit: Payer: 59

## 2015-07-10 DIAGNOSIS — E785 Hyperlipidemia, unspecified: Secondary | ICD-10-CM

## 2015-07-10 DIAGNOSIS — E119 Type 2 diabetes mellitus without complications: Secondary | ICD-10-CM

## 2015-07-10 DIAGNOSIS — I10 Essential (primary) hypertension: Secondary | ICD-10-CM

## 2015-07-10 DIAGNOSIS — Z125 Encounter for screening for malignant neoplasm of prostate: Secondary | ICD-10-CM

## 2015-07-10 DIAGNOSIS — Z79899 Other long term (current) drug therapy: Secondary | ICD-10-CM

## 2015-07-10 DIAGNOSIS — Z0000 Encounter for general adult medical examination without abnormal findings: Secondary | ICD-10-CM

## 2015-07-10 LAB — COMPLETE METABOLIC PANEL WITH GFR
ALK PHOS: 102 U/L (ref 40–115)
ALT: 19 U/L (ref 9–46)
AST: 19 U/L (ref 10–35)
Albumin: 4.3 g/dL (ref 3.6–5.1)
BILIRUBIN TOTAL: 0.5 mg/dL (ref 0.2–1.2)
BUN: 12 mg/dL (ref 7–25)
CHLORIDE: 103 mmol/L (ref 98–110)
CO2: 27 mmol/L (ref 20–31)
Calcium: 9.5 mg/dL (ref 8.6–10.3)
Creat: 0.81 mg/dL (ref 0.70–1.25)
GFR, Est African American: >89 mL/min (ref >=60)
GLUCOSE: 124 mg/dL — AB (ref 70–99)
POTASSIUM: 4.5 mmol/L (ref 3.5–5.3)
SODIUM: 139 mmol/L (ref 135–146)
Total Protein: 6.8 g/dL (ref 6.1–8.1)

## 2015-07-10 LAB — CBC WITH DIFFERENTIAL/PLATELET
BASOS ABS: 60 {cells}/uL (ref 0–200)
Basophils Relative: 1 %
EOS PCT: 2 %
Eosinophils Absolute: 120 cells/uL (ref 15–500)
HCT: 45.6 % (ref 38.5–50.0)
HEMOGLOBIN: 15.2 g/dL (ref 13.0–17.0)
LYMPHS ABS: 1620 {cells}/uL (ref 850–3900)
Lymphocytes Relative: 27 %
MCH: 30 pg (ref 27.0–33.0)
MCHC: 33.3 g/dL (ref 32.0–36.0)
MCV: 89.9 fL (ref 80.0–100.0)
MPV: 9.4 fL (ref 7.5–12.5)
Monocytes Absolute: 420 cells/uL (ref 200–950)
Monocytes Relative: 7 %
NEUTROS ABS: 3780 {cells}/uL (ref 1500–7800)
Neutrophils Relative %: 63 %
Platelets: 275 10*3/uL (ref 140–400)
RBC: 5.07 MIL/uL (ref 4.20–5.80)
RDW: 13.5 % (ref 11.0–15.0)
WBC: 6 10*3/uL (ref 3.8–10.8)

## 2015-07-10 LAB — LIPID PANEL
Cholesterol: 213 mg/dL — ABNORMAL HIGH (ref 125–200)
HDL: 21 mg/dL — ABNORMAL LOW (ref >=40)
LDL CALC: 154 mg/dL — AB (ref <130)
TRIGLYCERIDES: 189 mg/dL — AB (ref <150)
Total CHOL/HDL Ratio: 10.1 Ratio — ABNORMAL HIGH (ref <=5.0)
VLDL: 38 mg/dL — ABNORMAL HIGH (ref <30)

## 2015-07-10 LAB — TSH: TSH: 1.02 mIU/L (ref 0.40–4.50)

## 2015-07-10 LAB — HEMOGLOBIN A1C
HEMOGLOBIN A1C: 7.8 % — AB (ref <5.7)
MEAN PLASMA GLUCOSE: 177 mg/dL

## 2015-07-11 LAB — PSA: PSA: 0.98 ng/mL (ref <=4.00)

## 2015-07-16 ENCOUNTER — Encounter: Payer: Self-pay | Admitting: Family Medicine

## 2015-07-16 ENCOUNTER — Ambulatory Visit (INDEPENDENT_AMBULATORY_CARE_PROVIDER_SITE_OTHER): Payer: 59 | Admitting: Family Medicine

## 2015-07-16 VITALS — BP 164/100 | HR 82 | Temp 97.9°F | Resp 16 | Ht 67.0 in | Wt 174.0 lb

## 2015-07-16 DIAGNOSIS — Z794 Long term (current) use of insulin: Secondary | ICD-10-CM | POA: Diagnosis not present

## 2015-07-16 DIAGNOSIS — E119 Type 2 diabetes mellitus without complications: Secondary | ICD-10-CM

## 2015-07-16 DIAGNOSIS — IMO0001 Reserved for inherently not codable concepts without codable children: Secondary | ICD-10-CM

## 2015-07-16 DIAGNOSIS — Z23 Encounter for immunization: Secondary | ICD-10-CM | POA: Diagnosis not present

## 2015-07-16 DIAGNOSIS — Z0000 Encounter for general adult medical examination without abnormal findings: Secondary | ICD-10-CM

## 2015-07-16 DIAGNOSIS — I10 Essential (primary) hypertension: Secondary | ICD-10-CM | POA: Diagnosis not present

## 2015-07-16 DIAGNOSIS — E785 Hyperlipidemia, unspecified: Secondary | ICD-10-CM | POA: Diagnosis not present

## 2015-07-16 DIAGNOSIS — Z1211 Encounter for screening for malignant neoplasm of colon: Secondary | ICD-10-CM

## 2015-07-16 MED ORDER — LOSARTAN POTASSIUM 50 MG PO TABS: 50.0000 mg | ORAL_TABLET | Freq: Every day | ORAL | Status: DC

## 2015-07-16 MED ORDER — OMEGA-3-ACID ETHYL ESTERS 1 G PO CAPS: 2.0000 g | ORAL_CAPSULE | Freq: Two times a day (BID) | ORAL | Status: DC

## 2015-07-16 MED ORDER — NIACIN ER (ANTIHYPERLIPIDEMIC) 500 MG PO TBCR: 500.0000 mg | EXTENDED_RELEASE_TABLET | Freq: Every day | ORAL | Status: DC

## 2015-07-16 MED ORDER — METFORMIN HCL 500 MG PO TABS: 500.0000 mg | ORAL_TABLET | Freq: Two times a day (BID) | ORAL | Status: DC

## 2015-07-16 MED ORDER — PIOGLITAZONE HCL 15 MG PO TABS: 15.0000 mg | ORAL_TABLET | Freq: Every day | ORAL | Status: DC

## 2015-07-16 MED ORDER — ROSUVASTATIN CALCIUM 20 MG PO TABS: 20.0000 mg | ORAL_TABLET | Freq: Every day | ORAL | Status: DC

## 2015-07-16 MED ORDER — SITAGLIPTIN PHOSPHATE 100 MG PO TABS: 100.0000 mg | ORAL_TABLET | Freq: Every day | ORAL | Status: DC

## 2015-07-16 NOTE — Addendum Note (Signed)
Addended by: Shary Decamp B on: 07/16/2015 11:43 AM   Modules accepted: Orders

## 2015-07-16 NOTE — Progress Notes (Signed)
Subjective:    Patient ID: Micheal Donovan, male    DOB: March 19, 1950, 65 y.o.   MRN: [id number]  HPI Here today for complete physical exam. Ran out of all of his pills near the start of February. Has only been on insulin since that time. As a result his most recent lab work is out of control. His blood pressures extremely high. He is overdue for diabetic eye exam. He is overdue for colonoscopy. Pneumovax 23 is up-to-date but he is due for Prevnar 13. Diabetic foot exam is performed today and is significant for onychomycosis of the right great toenail but is otherwise normal Lab on 07/10/2015  Component Date Value Ref Range Status  . Sodium 07/10/2015 139  135 - 146 mmol/L Final  . Potassium 07/10/2015 4.5  3.5 - 5.3 mmol/L Final  . Chloride 07/10/2015 103  98 - 110 mmol/L Final  . CO2 07/10/2015 27  20 - 31 mmol/L Final  . Glucose, Bld 07/10/2015 124* 70 - 99 mg/dL Final  . BUN 07/10/2015 12  7 - 25 mg/dL Final  . Creat 07/10/2015 0.81  0.70 - 1.25 mg/dL Final  . Total Bilirubin 07/10/2015 0.5  0.2 - 1.2 mg/dL Final  . Alkaline Phosphatase 07/10/2015 102  40 - 115 U/L Final  . AST 07/10/2015 19  10 - 35 U/L Final  . ALT 07/10/2015 19  9 - 46 U/L Final  . Total Protein 07/10/2015 6.8  6.1 - 8.1 g/dL Final  . Albumin 07/10/2015 4.3  3.6 - 5.1 g/dL Final  . Calcium 07/10/2015 9.5  8.6 - 10.3 mg/dL Final  . GFR, Est African American 07/10/2015 >89  >=60 mL/min Final  . GFR, Est Non African American 07/10/2015 >89  >=60 mL/min Final   Comment:   The estimated GFR is a calculation valid for adults (>=50 years old) that uses the CKD-EPI algorithm to adjust for age and sex. It is   not to be used for children, pregnant women, hospitalized patients,    patients on dialysis, or with rapidly changing kidney function. According to the NKDEP, eGFR >89 is normal, 60-89 shows mild impairment, 30-59 shows moderate impairment, 15-29 shows severe impairment and <15 is ESRD.     . TSH 07/10/2015 1.02   0.40 - 4.50 mIU/L Final  . Cholesterol 07/10/2015 213* 125 - 200 mg/dL Final  . Triglycerides 07/10/2015 189* <150 mg/dL Final  . HDL 07/10/2015 21* >=40 mg/dL Final  . Total CHOL/HDL Ratio 07/10/2015 10.1* <=5.0 Ratio Final  . VLDL 07/10/2015 38* <30 mg/dL Final  . LDL Cholesterol 07/10/2015 154* <130 mg/dL Final   Comment:   Total Cholesterol/HDL Ratio:CHD Risk                        Coronary Heart Disease Risk Table                                        Men       Women          1/2 Average Risk              3.4        3.3              Average Risk              5.0  4.4           2X Average Risk              9.6        7.1           3X Average Risk             23.4       11.0 Use the calculated Patient Ratio above and the CHD Risk table  to determine the patient's CHD Risk.   . WBC 07/10/2015 6.0  3.8 - 10.8 K/uL Final  . RBC 07/10/2015 5.07  4.20 - 5.80 MIL/uL Final  . Hemoglobin 07/10/2015 15.2  13.0 - 17.0 g/dL Final  . HCT 07/10/2015 45.6  38.5 - 50.0 % Final  . MCV 07/10/2015 89.9  80.0 - 100.0 fL Final  . MCH 07/10/2015 30.0  27.0 - 33.0 pg Final  . MCHC 07/10/2015 33.3  32.0 - 36.0 g/dL Final  . RDW 07/10/2015 13.5  11.0 - 15.0 % Final  . Platelets 07/10/2015 275  140 - 400 K/uL Final  . MPV 07/10/2015 9.4  7.5 - 12.5 fL Final  . Neutro Abs 07/10/2015 3780  1500 - 7800 cells/uL Final  . Lymphs Abs 07/10/2015 1620  850 - 3900 cells/uL Final  . Monocytes Absolute 07/10/2015 420  200 - 950 cells/uL Final  . Eosinophils Absolute 07/10/2015 120  15 - 500 cells/uL Final  . Basophils Absolute 07/10/2015 60  0 - 200 cells/uL Final  . Neutrophils Relative % 07/10/2015 63   Final  . Lymphocytes Relative 07/10/2015 27   Final  . Monocytes Relative 07/10/2015 7   Final  . Eosinophils Relative 07/10/2015 2   Final  . Basophils Relative 07/10/2015 1   Final  . Smear Review 07/10/2015 Criteria for review not met   Final   ** Please note change in unit of measure and reference  range(s). **  . Hgb A1c MFr Bld 07/10/2015 7.8* <5.7 % Final   Comment:   For someone without known diabetes, a hemoglobin A1c value of 6.5% or greater indicates that they may have diabetes and this should be confirmed with a follow-up test.   For someone with known diabetes, a value <7% indicates that their diabetes is well controlled and a value greater than or equal to 7% indicates suboptimal control. A1c targets should be individualized based on duration of diabetes, age, comorbid conditions, and other considerations.   Currently, no consensus exists for use of hemoglobin A1c for diagnosis of diabetes for children.     . Mean Plasma Glucose 07/10/2015 177   Final  . PSA 07/10/2015 0.98  <=4.00 ng/mL Final   Comment: Test Methodology: ECLIA PSA (Electrochemiluminescence Immunoassay)   For PSA values from 2.5-4.0, particularly in younger men <31 years old, the AUA and NCCN suggest testing for % Free PSA (3515) and evaluation of the rate of increase in PSA (PSA velocity).    Past Medical History  Diagnosis Date  . Hypertension   . Hyperlipidemia   . Allergy   . ED (erectile dysfunction)   . Diabetes mellitus   . Cancer ([place])     Melanoma on Neck    2008  . BPH (benign prostatic hypertrophy)   . Carotid artery occlusion    Past Surgical History  Procedure Laterality Date  . Melanoma excision  2008    Left side of neck   Current Outpatient Prescriptions on File Prior to Visit  Medication Sig  Dispense Refill  . aspirin 325 MG tablet Take 325 mg by mouth daily. Reported on 07/16/2015    . Insulin Glargine (LANTUS) 100 UNIT/ML Solostar Pen Inject 22 Units into the skin every morning. 15 mL 6  . omeprazole (PRILOSEC) 20 MG capsule Take 20 mg by mouth daily. Reported on 07/16/2015    . glucose blood (ONE TOUCH ULTRA TEST) test strip Use to monitor FSBS 2x daily. DX: E11.9 (Patient not taking: Reported on 07/16/2015) 100 each 11  . niacin (NIASPAN) 1000 MG CR tablet Take 1  tablet (1,000 mg total) by mouth at bedtime. (Needs office visit and labs before further refills) (Patient not taking: Reported on 07/16/2015) 30 tablet 0  . sildenafil (VIAGRA) 100 MG tablet Take 0.5-1 tablets (50-100 mg total) by mouth daily as needed for erectile dysfunction. (Patient not taking: Reported on 07/16/2015) 5 tablet 11   No current facility-administered medications on file prior to visit.   Allergies  Allergen Reactions  . Niaspan [Niacin]     FLUSHING   Social History   Social History  . Marital Status: Single    Spouse Name: N/A  . Number of Children: N/A  . Years of Education: N/A   Occupational History  . Not on file.   Social History Main Topics  . Smoking status: Former Smoker    Quit date: 07/21/1990  . Smokeless tobacco: Never Used  . Alcohol Use: No  . Drug Use: No  . Sexual Activity: Not on file   Other Topics Concern  . Not on file   Social History Narrative   Family History  Problem Relation Age of Onset  . COPD Mother   . Heart disease Father   . Heart disease Brother     MI at age 60      Review of Systems  All other systems reviewed and are negative.      Objective:   Physical Exam  Constitutional: He is oriented to person, place, and time. He appears well-developed and well-nourished. No distress.  HENT:  Head: Normocephalic and atraumatic.  Right Ear: External ear normal.  Left Ear: External ear normal.  Nose: Nose normal.  Mouth/Throat: Oropharynx is clear and moist. No oropharyngeal exudate.  Eyes: Conjunctivae and EOM are normal. Pupils are equal, round, and reactive to light. Right eye exhibits no discharge. Left eye exhibits no discharge. No scleral icterus.  Neck: Normal range of motion. Neck supple. No JVD present. No tracheal deviation present. No thyromegaly present.  Cardiovascular: Normal rate, regular rhythm, normal heart sounds and intact distal pulses.  Exam reveals no gallop and no friction rub.   No murmur  heard. Pulmonary/Chest: Effort normal and breath sounds normal. No stridor. No respiratory distress. He has no wheezes. He has no rales. He exhibits no tenderness.  Abdominal: Soft. Bowel sounds are normal. He exhibits no distension and no mass. There is no tenderness. There is no rebound and no guarding.  Genitourinary: Rectum normal, prostate normal and penis normal.  Musculoskeletal: Normal range of motion. He exhibits no edema.  Lymphadenopathy:    He has no cervical adenopathy.  Neurological: He is alert and oriented to person, place, and time. He has normal reflexes. He displays normal reflexes. No cranial nerve deficit. He exhibits normal muscle tone. Coordination normal.  Skin: Skin is warm. No rash noted. He is not diaphoretic. No erythema. No pallor.  Psychiatric: He has a normal mood and affect. His behavior is normal. Judgment and thought content normal.  Vitals reviewed.         Assessment & Plan:  Dyslipidemia  Routine general medical examination at a health care facility  Benign essential HTN  IDDM (insulin dependent diabetes mellitus) ([place])  Colon cancer screening - Plan: Ambulatory referral to Gastroenterology  Blood pressures out of control. I recommended he resume his blood pressure medication immediately and recheck his blood pressure in 2 weeks. Diabetes is out of control. I recommended that he resume metformin, Actos, and Januvia and recheck a hemoglobin A1c in 3 months. Also recommended that he schedule a diabetic eye exam as soon as possible. Because he is a diabetic, I did give him Prevnar 13 today. Cholesterol is out of control. I recommended that he resume Crestor and Niaspan and recheck a fasting lipid panel in 3 months. I will also schedule the patient for colonoscopy.

## 2015-07-20 LAB — HM DIABETES EYE EXAM

## 2015-07-26 ENCOUNTER — Other Ambulatory Visit: Payer: Self-pay | Admitting: Family Medicine

## 2015-07-26 MED ORDER — INSULIN GLARGINE 100 UNIT/ML SOLOSTAR PEN: 22.0000 [IU] | PEN_INJECTOR | SUBCUTANEOUS | Status: DC

## 2015-07-26 NOTE — Telephone Encounter (Signed)
Medication called/sent to requested pharmacy  

## 2015-08-04 ENCOUNTER — Encounter: Payer: Self-pay | Admitting: Family Medicine

## 2015-08-09 ENCOUNTER — Ambulatory Visit (INDEPENDENT_AMBULATORY_CARE_PROVIDER_SITE_OTHER): Payer: 59 | Admitting: Family Medicine

## 2015-08-09 VITALS — BP 142/78 | HR 88 | Temp 98.7°F | Resp 16 | Ht 67.0 in | Wt 172.0 lb

## 2015-08-09 DIAGNOSIS — J019 Acute sinusitis, unspecified: Secondary | ICD-10-CM | POA: Diagnosis not present

## 2015-08-09 MED ORDER — AMOXICILLIN-POT CLAVULANATE 875-125 MG PO TABS: 1.0000 | ORAL_TABLET | Freq: Two times a day (BID) | ORAL | Status: DC

## 2015-08-09 NOTE — Progress Notes (Signed)
Subjective:    Patient ID: Micheal Donovan, male    DOB: 1950/08/06, 65 y.o.   MRN: [id number]  HPI  Patient reports pain and pressure in his right maxillary sinus for more than 1 week. He has a dull constant headache. Nothing will drain. He reports pain in his teeth and in his cheeks. It feels like his previous sinus infections Past Medical History  Diagnosis Date  . Hypertension   . Hyperlipidemia   . Allergy   . ED (erectile dysfunction)   . Diabetes mellitus   . Cancer ([place])     Melanoma on Neck    2008  . BPH (benign prostatic hypertrophy)   . Carotid artery occlusion   . Retinopathy due to secondary DM [facility])    Past Surgical History  Procedure Laterality Date  . Melanoma excision  2008    Left side of neck   Current Outpatient Prescriptions on File Prior to Visit  Medication Sig Dispense Refill  . aspirin 325 MG tablet Take 325 mg by mouth daily. Reported on 07/16/2015    . glucose blood (ONE TOUCH ULTRA TEST) test strip Use to monitor FSBS 2x daily. DX: E11.9 100 each 11  . Insulin Glargine (LANTUS) 100 UNIT/ML Solostar Pen Inject 22 Units into the skin every morning. 15 mL 6  . losartan (COZAAR) 50 MG tablet Take 1 tablet (50 mg total) by mouth daily. 30 tablet 5  . metFORMIN (GLUCOPHAGE) 500 MG tablet Take 1 tablet (500 mg total) by mouth 2 (two) times daily with a meal. (Needs office visit and labs before further refills) 60 tablet 5  . niacin (NIASPAN) 500 MG CR tablet Take 1 tablet (500 mg total) by mouth at bedtime. 30 tablet 5  . omega-3 acid ethyl esters (LOVAZA) 1 g capsule Take 2 capsules (2 g total) by mouth 2 (two) times daily. 120 capsule 5  . omeprazole (PRILOSEC) 20 MG capsule Take 20 mg by mouth daily. Reported on 07/16/2015    . pioglitazone (ACTOS) 15 MG tablet Take 1 tablet (15 mg total) by mouth daily. 30 tablet 5  . rosuvastatin (CRESTOR) 20 MG tablet Take 1 tablet (20 mg total) by mouth daily. 30 tablet 5  . sitaGLIPtin (JANUVIA) 100 MG tablet Take 1  tablet (100 mg total) by mouth daily. (Needs office visit and labs before further refills) 30 tablet 5   No current facility-administered medications on file prior to visit.   Allergies  Allergen Reactions  . Niaspan [Niacin]     FLUSHING   Social History   Social History  . Marital Status: Single    Spouse Name: N/A  . Number of Children: N/A  . Years of Education: N/A   Occupational History  . Not on file.   Social History Main Topics  . Smoking status: Former Smoker    Quit date: 07/21/1990  . Smokeless tobacco: Never Used  . Alcohol Use: No  . Drug Use: No  . Sexual Activity: Not on file   Other Topics Concern  . Not on file   Social History Narrative     Review of Systems  All other systems reviewed and are negative.      Objective:   Physical Exam  Constitutional: He appears well-developed and well-nourished. No distress.  HENT:  Nose: Mucosal edema and rhinorrhea present. Right sinus exhibits maxillary sinus tenderness and frontal sinus tenderness.  Mouth/Throat: Oropharynx is clear and moist. No oropharyngeal exudate.  Eyes: Conjunctivae are normal.  Neck: Neck supple.  Cardiovascular: Normal rate, regular rhythm and normal heart sounds.   Pulmonary/Chest: Effort normal and breath sounds normal.  Lymphadenopathy:    He has no cervical adenopathy.  Skin: He is not diaphoretic.  Vitals reviewed.         Assessment & Plan:  Acute rhinosinusitis - Plan: amoxicillin-clavulanate (AUGMENTIN) 875-125 MG tablet  Patient appears to have a sinus infection. Begin Augmentin 875 mg by mouth twice a day for 10 days. Recommended nasal saline rinses 3-4 times a day. Recheck in 2 weeks if no better or sooner if worse

## 2015-09-24 ENCOUNTER — Encounter: Payer: Self-pay | Admitting: Family Medicine

## 2015-12-11 ENCOUNTER — Other Ambulatory Visit: Payer: Self-pay | Admitting: Family Medicine

## 2015-12-14 ENCOUNTER — Ambulatory Visit (HOSPITAL_COMMUNITY): Payer: 59

## 2015-12-14 ENCOUNTER — Telehealth: Payer: Self-pay | Admitting: Family Medicine

## 2015-12-14 ENCOUNTER — Ambulatory Visit (INDEPENDENT_AMBULATORY_CARE_PROVIDER_SITE_OTHER): Payer: 59

## 2015-12-14 ENCOUNTER — Ambulatory Visit (HOSPITAL_COMMUNITY)
Admission: EM | Admit: 2015-12-14 | Discharge: 2015-12-14 | Disposition: A | Discharge status: Home / Self Care | Payer: 59 | Attending: Emergency Medicine | Admitting: Emergency Medicine

## 2015-12-14 ENCOUNTER — Encounter (HOSPITAL_COMMUNITY): Payer: Self-pay | Admitting: Emergency Medicine

## 2015-12-14 DIAGNOSIS — S8011XA Contusion of right lower leg, initial encounter: Secondary | ICD-10-CM

## 2015-12-14 DIAGNOSIS — S8991XA Unspecified injury of right lower leg, initial encounter: Secondary | ICD-10-CM

## 2015-12-14 MED ORDER — NAPROXEN 500 MG PO TABS
500.0000 mg | ORAL_TABLET | Freq: Two times a day (BID) | ORAL | 0 refills | Status: DC
Start: 2015-12-14 — End: 2016-03-11

## 2015-12-14 NOTE — Telephone Encounter (Signed)
Pt walked into office with injury to rt lower leg.  [place]  Rt lower leg VERY swollen and pt is having difficulty walking. Open, non bleeding area over shin.  Advised this could be serious injury due to the noted swelling.  Advised him to go to [facility] or ED for immediate treatment.
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
## 2015-12-14 NOTE — Discharge Instructions (Signed)
You have a soft tissue injury to your right calf. No fracture that was seen. It is really important to ice and elevate given the amount of swelling you are experiencing. If you have color change (pale) of the leg, foot or toes or worsening pain then go to the ED right away. Take the anti-inflammatory to help with swelling. Nice to meet you, hope this heals quickly.

## 2016-01-08 ENCOUNTER — Other Ambulatory Visit: Payer: Self-pay | Admitting: Family Medicine

## 2016-01-17 ENCOUNTER — Other Ambulatory Visit: Payer: Self-pay | Admitting: Family Medicine

## 2016-02-14 ENCOUNTER — Other Ambulatory Visit: Payer: Self-pay | Admitting: Family Medicine

## 2016-02-27 ENCOUNTER — Other Ambulatory Visit: Payer: Self-pay | Admitting: Family Medicine

## 2016-03-05 ENCOUNTER — Encounter: Payer: Self-pay | Admitting: Family Medicine

## 2016-03-05 ENCOUNTER — Other Ambulatory Visit: Payer: 59

## 2016-03-05 ENCOUNTER — Other Ambulatory Visit: Payer: Self-pay | Admitting: Family Medicine

## 2016-03-05 DIAGNOSIS — Z79899 Other long term (current) drug therapy: Secondary | ICD-10-CM

## 2016-03-05 DIAGNOSIS — Z794 Long term (current) use of insulin: Principal | ICD-10-CM

## 2016-03-05 DIAGNOSIS — E785 Hyperlipidemia, unspecified: Secondary | ICD-10-CM

## 2016-03-05 DIAGNOSIS — IMO0001 Reserved for inherently not codable concepts without codable children: Secondary | ICD-10-CM

## 2016-03-05 DIAGNOSIS — I10 Essential (primary) hypertension: Secondary | ICD-10-CM

## 2016-03-05 DIAGNOSIS — E1165 Type 2 diabetes mellitus with hyperglycemia: Principal | ICD-10-CM

## 2016-03-05 LAB — CBC WITH DIFFERENTIAL/PLATELET
Basophils Absolute: 0 cells/uL (ref 0–200)
Basophils Relative: 0 %
EOS ABS: 204 {cells}/uL (ref 15–500)
Eosinophils Relative: 3 %
HEMATOCRIT: 44.2 % (ref 38.5–50.0)
HEMOGLOBIN: 14.7 g/dL (ref 13.0–17.0)
LYMPHS ABS: 1088 {cells}/uL (ref 850–3900)
LYMPHS PCT: 16 %
MCH: 30.2 pg (ref 27.0–33.0)
MCHC: 33.3 g/dL (ref 32.0–36.0)
MCV: 90.9 fL (ref 80.0–100.0)
MONO ABS: 748 {cells}/uL (ref 200–950)
MPV: 9.3 fL (ref 7.5–12.5)
Monocytes Relative: 11 %
NEUTROS PCT: 70 %
Neutro Abs: 4760 cells/uL (ref 1500–7800)
Platelets: 182 10*3/uL (ref 140–400)
RBC: 4.86 MIL/uL (ref 4.20–5.80)
RDW: 13.6 % (ref 11.0–15.0)
WBC: 6.8 10*3/uL (ref 3.8–10.8)

## 2016-03-05 LAB — COMPLETE METABOLIC PANEL WITH GFR
ALBUMIN: 4.3 g/dL (ref 3.6–5.1)
ALK PHOS: 89 U/L (ref 40–115)
ALT: 17 U/L (ref 9–46)
AST: 21 U/L (ref 10–35)
BILIRUBIN TOTAL: 0.4 mg/dL (ref 0.2–1.2)
BUN: 12 mg/dL (ref 7–25)
CO2: 26 mmol/L (ref 20–31)
Calcium: 9.1 mg/dL (ref 8.6–10.3)
Chloride: 99 mmol/L (ref 98–110)
Creat: 0.91 mg/dL (ref 0.70–1.25)
GFR, EST NON AFRICAN AMERICAN: 88 mL/min (ref >=60)
GLUCOSE: 154 mg/dL — AB (ref 70–99)
POTASSIUM: 4.3 mmol/L (ref 3.5–5.3)
SODIUM: 136 mmol/L (ref 135–146)
TOTAL PROTEIN: 6.6 g/dL (ref 6.1–8.1)

## 2016-03-05 LAB — LIPID PANEL
CHOL/HDL RATIO: 4.3 ratio (ref <5.0)
Cholesterol: 116 mg/dL (ref <200)
HDL: 27 mg/dL — AB (ref >40)
LDL Cholesterol: 69 mg/dL (ref <100)
Triglycerides: 100 mg/dL (ref <150)
VLDL: 20 mg/dL (ref <30)

## 2016-03-05 LAB — HEMOGLOBIN A1C
Hgb A1c MFr Bld: 7 % — ABNORMAL HIGH (ref <5.7)
MEAN PLASMA GLUCOSE: 154 mg/dL

## 2016-03-07 LAB — MICROALBUMIN / CREATININE URINE RATIO
Creatinine, Urine: 162 mg/dL (ref 20–370)
Microalb Creat Ratio: 6 mcg/mg creat (ref <30)
Microalb, Ur: 0.9 mg/dL

## 2016-03-08 ENCOUNTER — Ambulatory Visit (HOSPITAL_COMMUNITY)
Admission: EM | Admit: 2016-03-08 | Discharge: 2016-03-08 | Disposition: A | Discharge status: Home / Self Care | Payer: 59 | Attending: Family Medicine | Admitting: Family Medicine

## 2016-03-08 ENCOUNTER — Encounter (HOSPITAL_COMMUNITY): Payer: Self-pay | Admitting: Emergency Medicine

## 2016-03-08 DIAGNOSIS — R059 Cough, unspecified: Secondary | ICD-10-CM

## 2016-03-08 DIAGNOSIS — J988 Other specified respiratory disorders: Secondary | ICD-10-CM

## 2016-03-08 DIAGNOSIS — R05 Cough: Secondary | ICD-10-CM

## 2016-03-08 MED ORDER — HYDROCODONE-HOMATROPINE 5-1.5 MG/5ML PO SYRP: 5.0000 mL | ORAL_SOLUTION | Freq: Four times a day (QID) | ORAL | 0 refills | Status: DC | PRN

## 2016-03-08 MED ORDER — ACETAMINOPHEN 325 MG PO TABS
650.0000 mg | ORAL_TABLET | Freq: Once | ORAL | Status: AC
  Administered 2016-03-08: 650 mg via ORAL

## 2016-03-08 MED ORDER — AZITHROMYCIN 250 MG PO TABS: 250.0000 mg | ORAL_TABLET | Freq: Every day | ORAL | 0 refills | Status: DC

## 2016-03-08 MED ORDER — ACETAMINOPHEN 325 MG PO TABS
ORAL_TABLET | ORAL | Status: AC
  Filled 2016-03-08: qty 2

## 2016-03-08 MED ORDER — ALBUTEROL SULFATE HFA 108 (90 BASE) MCG/ACT IN AERS: INHALATION_SPRAY | RESPIRATORY_TRACT | 2 refills | Status: DC

## 2016-03-08 NOTE — ED Triage Notes (Signed)
Patient reports onset was Tuesday 1/2.  Nasal congestion/drainage, minimal sore throat, raspy sounds in chest

## 2016-03-08 NOTE — ED Provider Notes (Signed)
CSN: [id number]     Arrival date & time 03/08/16  1617 History   First MD Initiated Contact with Patient 03/08/16 1740     Chief Complaint  Patient presents with  . URI   (Consider location/radiation/quality/duration/timing/severity/associated sxs/prior Treatment) 66 yo presents with productive cough, "tightness in his chest", mild dyspnea and fatigue. No fevers are noted. Onset 5 days without improvement.       Past Medical History:  Diagnosis Date  . Allergy   . BPH (benign prostatic hypertrophy)   . Cancer ([place])    Melanoma on Neck    2008  . Carotid artery occlusion   . Diabetes mellitus   . ED (erectile dysfunction)   . Hyperlipidemia   . Hypertension   . Retinopathy due to secondary DM [facility])    Past Surgical History:  Procedure Laterality Date  . MELANOMA EXCISION  2008   Left side of neck   Family History  Problem Relation Age of Onset  . COPD Mother   . Heart disease Father   . Heart disease Brother     MI at age 49   Social History  Substance Use Topics  . Smoking status: Former Smoker    Quit date: 07/21/1990  . Smokeless tobacco: Never Used  . Alcohol use No    Review of Systems  Constitutional: Positive for fatigue. Negative for chills and fever.  HENT: Negative.   Eyes: Negative.   Respiratory: Positive for cough, chest tightness and shortness of breath. Negative for choking.   Skin: Negative.   Psychiatric/Behavioral: Negative.     Allergies  Niaspan [niacin]  Home Medications   Prior to Admission medications   Medication Sig Start Date End Date Taking? Authorizing Provider  albuterol (PROVENTIL HFA;VENTOLIN HFA) 108 (90 Base) MCG/ACT inhaler 1-2 puffs every 6 hours while awake for 24 hours then prn cough 03/08/16   Bjorn Pippin, PA-C  amoxicillin-clavulanate (AUGMENTIN) 875-125 MG tablet Take 1 tablet by mouth 2 (two) times daily. Patient not taking: Reported on 03/08/2016 08/09/15   [name], MD  aspirin 325 MG tablet Take 325 mg  by mouth daily. Reported on 07/16/2015    Historical Provider, MD  azithromycin (ZITHROMAX) 250 MG tablet Take 1 tablet (250 mg total) by mouth daily. Take first 2 tablets together, then 1 every day until finished. 03/08/16   Bjorn Pippin, PA-C  HYDROcodone-homatropine (HYCODAN) 5-1.5 MG/5ML syrup Take 5 mLs by mouth every 6 (six) hours as needed for cough. 03/08/16   Bjorn Pippin, PA-C  Insulin Glargine (LANTUS) 100 UNIT/ML Solostar Pen Inject 22 Units into the skin every morning. 07/26/15   [name], MD  JANUVIA 100 MG tablet take 1 tablet by mouth once daily 02/14/16   [name], MD  losartan (COZAAR) 50 MG tablet take 1 tablet by mouth once daily 02/14/16   [name], MD  metFORMIN (GLUCOPHAGE) 500 MG tablet take 1 tablet by mouth twice a day with food 02/14/16   [name], MD  naproxen (NAPROSYN) 500 MG tablet Take 1 tablet (500 mg total) by mouth 2 (two) times daily with a meal. 12/14/15   Bjorn Pippin, PA-C  niacin (NIASPAN) 500 MG CR tablet Take 1 tablet (500 mg total) by mouth at bedtime. Needs office visit and labs before further refills 02/27/16   [name], MD  omega-3 acid ethyl esters (LOVAZA) 1 g capsule take 2 capsules by mouth twice a day 02/14/16  [name], MD  omeprazole (PRILOSEC) 20 MG capsule Take 20 mg by mouth daily. Reported on 07/16/2015    Historical Provider, MD  ONE TOUCH ULTRA TEST test strip TEST twice a day 12/11/15   [name], MD  pioglitazone (ACTOS) 15 MG tablet take 1 tablet by mouth once daily 02/14/16   [name], MD  rosuvastatin (CRESTOR) 20 MG tablet take 1 tablet by mouth once daily 02/14/16   [name], MD   Meds Ordered and Administered this Visit   Medications  acetaminophen (TYLENOL) tablet 650 mg (650 mg Oral Given 03/08/16 1729)    BP 156/78 (BP Location: Left Arm)   Pulse 86   Temp 101.4 F (38.6 C) (Oral)   Resp 18   SpO2 100%  No data found.   Physical Exam   Constitutional: He is oriented to person, place, and time. He appears well-developed and well-nourished. No distress.  HENT:  Head: Normocephalic and atraumatic.  Mouth/Throat: Oropharynx is clear and moist.  Neck: Normal range of motion.  Cardiovascular: Normal rate and regular rhythm.   Pulmonary/Chest: Effort normal.  Crackles in the bases, no rales, slight wheeze in bases  Lymphadenopathy:    He has no cervical adenopathy.  Neurological: He is alert and oriented to person, place, and time.  Skin: Skin is warm and dry. He is not diaphoretic.  Psychiatric: His behavior is normal.  Nursing note and vitals reviewed.   Urgent Care Course   Clinical Course     Procedures (including critical care time)  Labs Review Labs Reviewed - No data to display  Imaging Review No results found.   Visual Acuity Review  Right Eye Distance:   Left Eye Distance:   Bilateral Distance:    Right Eye Near:   Left Eye Near:    Bilateral Near:         MDM   1. Respiratory infection   2. Cough    Exam warrants antibiotic coverage with use of inhaler and cough suppression. Rest and fluids. Patient is stable for D/C. FU if needed.     Bjorn Pippin, PA-C 03/08/16 1759

## 2016-03-08 NOTE — Discharge Instructions (Signed)
You have a respiratory infection. Cover with antibiotics. Use the cough syrup for night time cough. Delsym for day time as directed. Keep hydrated and use the inhaler to help with cough.

## 2016-03-11 ENCOUNTER — Encounter: Payer: Self-pay | Admitting: Family Medicine

## 2016-03-11 ENCOUNTER — Ambulatory Visit (INDEPENDENT_AMBULATORY_CARE_PROVIDER_SITE_OTHER): Payer: 59 | Admitting: Family Medicine

## 2016-03-11 VITALS — BP 140/80 | HR 78 | Temp 98.6°F | Resp 16 | Ht 67.0 in | Wt 182.0 lb

## 2016-03-11 DIAGNOSIS — Z794 Long term (current) use of insulin: Secondary | ICD-10-CM

## 2016-03-11 DIAGNOSIS — E119 Type 2 diabetes mellitus without complications: Secondary | ICD-10-CM | POA: Diagnosis not present

## 2016-03-11 DIAGNOSIS — I10 Essential (primary) hypertension: Secondary | ICD-10-CM | POA: Diagnosis not present

## 2016-03-11 DIAGNOSIS — E785 Hyperlipidemia, unspecified: Secondary | ICD-10-CM | POA: Diagnosis not present

## 2016-03-11 DIAGNOSIS — IMO0001 Reserved for inherently not codable concepts without codable children: Secondary | ICD-10-CM

## 2016-03-11 NOTE — Progress Notes (Signed)
Subjective:    Patient ID: Micheal Donovan, male    DOB: [birth year], 66 y.o.   MRN: [id number]  HPI Patient is here today for follow-up of his diabetes. His hemoglobin A1c has dropped to 7.0. His LDL cholesterol has improved. He denies any polyuria, polydipsia, or blurred vision. He denies any myalgias or right upper quadrant pain. He denies any chest pain shortness of breath or dyspnea on exertion. He refuses a flu shot. We reviewed his lab work today and calculated his 10 year risk of ASCVD and found to be greater than 30% even with his cholesterol as low as it is. Appointment on 03/05/2016  Component Date Value Ref Range Status  . Sodium 03/05/2016 136  135 - 146 mmol/L Final  . Potassium 03/05/2016 4.3  3.5 - 5.3 mmol/L Final  . Chloride 03/05/2016 99  98 - 110 mmol/L Final  . CO2 03/05/2016 26  20 - 31 mmol/L Final  . Glucose, Bld 03/05/2016 154* 70 - 99 mg/dL Final  . BUN 03/05/2016 12  7 - 25 mg/dL Final  . Creat 03/05/2016 0.91  0.70 - 1.25 mg/dL Final   Comment:   For patients > or = 66 years of age: The upper reference limit for Creatinine is approximately 13% higher for people identified as African-American.     . Total Bilirubin 03/05/2016 0.4  0.2 - 1.2 mg/dL Final  . Alkaline Phosphatase 03/05/2016 89  40 - 115 U/L Final  . AST 03/05/2016 21  10 - 35 U/L Final  . ALT 03/05/2016 17  9 - 46 U/L Final  . Total Protein 03/05/2016 6.6  6.1 - 8.1 g/dL Final  . Albumin 03/05/2016 4.3  3.6 - 5.1 g/dL Final  . Calcium 03/05/2016 9.1  8.6 - 10.3 mg/dL Final  . GFR, Est African American 03/05/2016 >89  >=60 mL/min Final  . GFR, Est Non African American 03/05/2016 88  >=60 mL/min Final  . Cholesterol 03/05/2016 116  <200 mg/dL Final  . Triglycerides 03/05/2016 100  <150 mg/dL Final  . HDL 03/05/2016 27* >40 mg/dL Final  . Total CHOL/HDL Ratio 03/05/2016 4.3  <5.0 Ratio Final  . VLDL 03/05/2016 20  <30 mg/dL Final  . LDL Cholesterol 03/05/2016 69  <100 mg/dL Final  . WBC  03/05/2016 6.8  3.8 - 10.8 K/uL Final  . RBC 03/05/2016 4.86  4.20 - 5.80 MIL/uL Final  . Hemoglobin 03/05/2016 14.7  13.0 - 17.0 g/dL Final  . HCT 03/05/2016 44.2  38.5 - 50.0 % Final  . MCV 03/05/2016 90.9  80.0 - 100.0 fL Final  . MCH 03/05/2016 30.2  27.0 - 33.0 pg Final  . MCHC 03/05/2016 33.3  32.0 - 36.0 g/dL Final  . RDW 03/05/2016 13.6  11.0 - 15.0 % Final  . Platelets 03/05/2016 182  140 - 400 K/uL Final  . MPV 03/05/2016 9.3  7.5 - 12.5 fL Final  . Neutro Abs 03/05/2016 4760  1,500 - 7,800 cells/uL Final  . Lymphs Abs 03/05/2016 1088  850 - 3,900 cells/uL Final  . Monocytes Absolute 03/05/2016 748  200 - 950 cells/uL Final  . Eosinophils Absolute 03/05/2016 204  15 - 500 cells/uL Final  . Basophils Absolute 03/05/2016 0  0 - 200 cells/uL Final  . Neutrophils Relative % 03/05/2016 70  % Final  . Lymphocytes Relative 03/05/2016 16  % Final  . Monocytes Relative 03/05/2016 11  % Final  . Eosinophils Relative 03/05/2016 3  % Final  . Basophils  Relative 03/05/2016 0  % Final  . Smear Review 03/05/2016 Criteria for review not met   Final  . Hgb A1c MFr Bld 03/05/2016 7.0* <5.7 % Final   Comment:   For someone without known diabetes, a hemoglobin A1c value of 6.5% or greater indicates that they may have diabetes and this should be confirmed with a follow-up test.   For someone with known diabetes, a value <7% indicates that their diabetes is well controlled and a value greater than or equal to 7% indicates suboptimal control. A1c targets should be individualized based on duration of diabetes, age, comorbid conditions, and other considerations.   Currently, no consensus exists for use of hemoglobin A1c for diagnosis of diabetes for children.     . Mean Plasma Glucose 03/05/2016 154  mg/dL Final  . Creatinine, Urine 03/07/2016 162  20 - 370 mg/dL Final  . Microalb, Ur 03/07/2016 0.9  Not estab mg/dL Final  . Microalb Creat Ratio 03/07/2016 6  <30 mcg/mg creat Final    Comment: The ADA has defined abnormalities in albumin excretion as follows:           Category           Result                            (mcg/mg creatinine)                 Normal:    <30       Microalbuminuria:    30 - 299   Clinical albuminuria:    > or = 300   The ADA recommends that at least two of three specimens collected within a 3 - 6 month period be abnormal before considering a patient to be within a diagnostic category.      Past Medical History:  Diagnosis Date  . Allergy   . BPH (benign prostatic hypertrophy)   . Cancer ([place])    Melanoma on Neck    2008  . Carotid artery occlusion   . Diabetes mellitus   . ED (erectile dysfunction)   . Hyperlipidemia   . Hypertension   . Retinopathy due to secondary DM [facility])    Past Surgical History:  Procedure Laterality Date  . MELANOMA EXCISION  2008   Left side of neck   Current Outpatient Prescriptions on File Prior to Visit  Medication Sig Dispense Refill  . albuterol (PROVENTIL HFA;VENTOLIN HFA) 108 (90 Base) MCG/ACT inhaler 1-2 puffs every 6 hours while awake for 24 hours then prn cough 1 Inhaler 2  . aspirin 325 MG tablet Take 325 mg by mouth daily. Reported on 07/16/2015    . Insulin Glargine (LANTUS) 100 UNIT/ML Solostar Pen Inject 22 Units into the skin every morning. 15 mL 6  . JANUVIA 100 MG tablet take 1 tablet by mouth once daily 30 tablet 0  . losartan (COZAAR) 50 MG tablet take 1 tablet by mouth once daily 30 tablet 0  . metFORMIN (GLUCOPHAGE) 500 MG tablet take 1 tablet by mouth twice a day with food 60 tablet 0  . niacin (NIASPAN) 500 MG CR tablet Take 1 tablet (500 mg total) by mouth at bedtime. Needs office visit and labs before further refills 30 tablet 0  . omega-3 acid ethyl esters (LOVAZA) 1 g capsule take 2 capsules by mouth twice a day 120 capsule 0  . omeprazole (PRILOSEC) 20 MG capsule Take 20 mg by mouth  daily. Reported on 07/16/2015    . ONE TOUCH ULTRA TEST test strip TEST twice a day 100 each  11  . pioglitazone (ACTOS) 15 MG tablet take 1 tablet by mouth once daily 30 tablet 0  . rosuvastatin (CRESTOR) 20 MG tablet take 1 tablet by mouth once daily 30 tablet 0   No current facility-administered medications on file prior to visit.    Allergies  Allergen Reactions  . Niaspan [Niacin]     FLUSHING   Social History   Social History  . Marital status: Single    Spouse name: N/A  . Number of children: N/A  . Years of education: N/A   Occupational History  . Not on file.   Social History Main Topics  . Smoking status: Former Smoker    Quit date: 07/21/1990  . Smokeless tobacco: Never Used  . Alcohol use No  . Drug use: No  . Sexual activity: Not on file   Other Topics Concern  . Not on file   Social History Narrative  . No narrative on file   Family History  Problem Relation Age of Onset  . COPD Mother   . Heart disease Father   . Heart disease Brother     MI at age 44      Review of Systems  All other systems reviewed and are negative.      Objective:   Physical Exam  Constitutional: He is oriented to person, place, and time. He appears well-developed and well-nourished. No distress.  HENT:  Head: Normocephalic and atraumatic.  Right Ear: External ear normal.  Left Ear: External ear normal.  Nose: Nose normal.  Mouth/Throat: Oropharynx is clear and moist. No oropharyngeal exudate.  Eyes: Conjunctivae and EOM are normal. Pupils are equal, round, and reactive to light. Right eye exhibits no discharge. Left eye exhibits no discharge. No scleral icterus.  Neck: Normal range of motion. Neck supple. No JVD present. No tracheal deviation present. No thyromegaly present.  Cardiovascular: Normal rate, regular rhythm, normal heart sounds and intact distal pulses.  Exam reveals no gallop and no friction rub.   No murmur heard. Pulmonary/Chest: Effort normal and breath sounds normal. No stridor. No respiratory distress. He has no wheezes. He has no rales. He  exhibits no tenderness.  Abdominal: Soft. Bowel sounds are normal. He exhibits no distension and no mass. There is no tenderness. There is no rebound and no guarding.  Genitourinary: Rectum normal, prostate normal and penis normal.  Musculoskeletal: Normal range of motion. He exhibits no edema.  Lymphadenopathy:    He has no cervical adenopathy.  Neurological: He is alert and oriented to person, place, and time. He has normal reflexes. No cranial nerve deficit. He exhibits normal muscle tone. Coordination normal.  Skin: Skin is warm. No rash noted. He is not diaphoretic. No erythema. No pallor.  Psychiatric: He has a normal mood and affect. His behavior is normal. Judgment and thought content normal.  Vitals reviewed.         Assessment & Plan:  Dyslipidemia  Benign essential HTN  IDDM (insulin dependent diabetes mellitus) ([place]) Patient's lab work actually looks pretty good. However he has a high ten-year risk of coronary disease due to his medical risk factors area therefore I recommended 30 minutes a day 5 days a week of aerobic exercise. Also recommended discontinuing Januvia and replacing with Jardiance dated evidence of reduction and cardiovascular morbidity. He will check on the price first. His cholesterol is well controlled  as is his blood pressure. He declines a flu shot.

## 2016-03-21 ENCOUNTER — Other Ambulatory Visit: Payer: Self-pay | Admitting: Family Medicine

## 2016-03-21 NOTE — Telephone Encounter (Signed)
Medication refilled per protocol. 

## 2016-03-31 ENCOUNTER — Other Ambulatory Visit: Payer: Self-pay | Admitting: Family Medicine

## 2016-04-18 ENCOUNTER — Telehealth: Payer: Self-pay | Admitting: Family Medicine

## 2016-04-18 NOTE — Telephone Encounter (Signed)
Pt wants to know if he can get all scripts for 90 day supply instead of just monthly due to his new insurnace it will be cheaper for him to do it this way. Please call him back. Uses [facility] on [address].

## 2016-04-21 MED ORDER — ROSUVASTATIN CALCIUM 20 MG PO TABS: 20.0000 mg | ORAL_TABLET | Freq: Every day | ORAL | 1 refills | Status: DC

## 2016-04-21 MED ORDER — GLUCOSE BLOOD VI STRP: ORAL_STRIP | 3 refills | Status: DC

## 2016-04-21 MED ORDER — PIOGLITAZONE HCL 15 MG PO TABS: 15.0000 mg | ORAL_TABLET | Freq: Every day | ORAL | 1 refills | Status: DC

## 2016-04-21 MED ORDER — INSULIN GLARGINE 100 UNIT/ML SOLOSTAR PEN: 22.0000 [IU] | PEN_INJECTOR | SUBCUTANEOUS | 3 refills | Status: DC

## 2016-04-21 MED ORDER — SITAGLIPTIN PHOSPHATE 100 MG PO TABS: 100.0000 mg | ORAL_TABLET | Freq: Every day | ORAL | 1 refills | Status: DC

## 2016-04-21 MED ORDER — NIACIN ER (ANTIHYPERLIPIDEMIC) 500 MG PO TBCR
EXTENDED_RELEASE_TABLET | ORAL | 1 refills | Status: DC
Start: 2016-04-21 — End: 2016-10-13

## 2016-04-21 MED ORDER — METFORMIN HCL 500 MG PO TABS
500.0000 mg | ORAL_TABLET | Freq: Two times a day (BID) | ORAL | 1 refills | Status: DC
Start: 2016-04-21 — End: 2016-10-13

## 2016-04-21 MED ORDER — LOSARTAN POTASSIUM 50 MG PO TABS: 50.0000 mg | ORAL_TABLET | Freq: Every day | ORAL | 1 refills | Status: DC

## 2016-04-21 MED ORDER — OMEGA-3-ACID ETHYL ESTERS 1 G PO CAPS: 2.0000 | ORAL_CAPSULE | Freq: Two times a day (BID) | ORAL | 1 refills | Status: DC

## 2016-04-21 NOTE — Telephone Encounter (Signed)
Medication called/sent to requested pharmacy  

## 2016-04-22 ENCOUNTER — Other Ambulatory Visit: Payer: Self-pay | Admitting: Family Medicine

## 2016-04-22 MED ORDER — INSULIN GLARGINE 100 UNIT/ML SOLOSTAR PEN: 22.0000 [IU] | PEN_INJECTOR | SUBCUTANEOUS | 3 refills | Status: DC

## 2016-07-30 ENCOUNTER — Encounter: Payer: Self-pay | Admitting: Physician Assistant

## 2016-07-30 ENCOUNTER — Ambulatory Visit (INDEPENDENT_AMBULATORY_CARE_PROVIDER_SITE_OTHER): Payer: PPO | Admitting: Physician Assistant

## 2016-07-30 ENCOUNTER — Other Ambulatory Visit: Payer: Self-pay | Admitting: Family Medicine

## 2016-07-30 VITALS — BP 140/82 | HR 81 | Temp 98.0°F | Resp 16 | Wt 180.2 lb

## 2016-07-30 DIAGNOSIS — S30861A Insect bite (nonvenomous) of abdominal wall, initial encounter: Secondary | ICD-10-CM | POA: Diagnosis not present

## 2016-07-30 DIAGNOSIS — W57XXXA Bitten or stung by nonvenomous insect and other nonvenomous arthropods, initial encounter: Secondary | ICD-10-CM

## 2016-07-30 MED ORDER — TRIAMCINOLONE ACETONIDE 0.1 % EX CREA: 1.0000 "application " | TOPICAL_CREAM | Freq: Two times a day (BID) | CUTANEOUS | 0 refills | Status: DC

## 2016-07-30 NOTE — Progress Notes (Signed)
Patient ID: Micheal Donovan MRN: [id number], DOB: 04/17/1950, 66 y.o. Date of Encounter: 07/30/2016, 9:44 AM    Chief Complaint:  Chief Complaint  Patient presents with  . rash from tick bite    in waist area      HPI: 66 y.o. year old male presents with above.   Says that he found some Tics on him this past Saturday and then on Sunday found 4 more. Says that those sites where he removed the tics are itchy. Has been applying over-the-counter cortisone cream but would like something stronger for the itch.  States that he has had no fever. No headache. No myalgias. No malaise. No rash. Has not felt sick/ill.     Home Meds:   Outpatient Medications Prior to Visit  Medication Sig Dispense Refill  . albuterol (PROVENTIL HFA;VENTOLIN HFA) 108 (90 Base) MCG/ACT inhaler 1-2 puffs every 6 hours while awake for 24 hours then prn cough 1 Inhaler 2  . aspirin 325 MG tablet Take 325 mg by mouth daily. Reported on 07/16/2015    . B-D UF III MINI PEN NEEDLES 31G X 5 MM MISC as directed 100 each 4  . glucose blood (ONE TOUCH ULTRA TEST) test strip TEST twice a day DX : E11.9 200 each 3  . Insulin Glargine (LANTUS) 100 UNIT/ML Solostar Pen Inject 22 Units into the skin every morning. 2 pen 3  . losartan (COZAAR) 50 MG tablet Take 1 tablet (50 mg total) by mouth daily. 90 tablet 1  . metFORMIN (GLUCOPHAGE) 500 MG tablet Take 1 tablet (500 mg total) by mouth 2 (two) times daily with a meal. 180 tablet 1  . niacin (NIASPAN) 500 MG CR tablet take 1 tablet by mouth at bedtime 90 tablet 1  . omega-3 acid ethyl esters (LOVAZA) 1 g capsule Take 2 capsules (2 g total) by mouth 2 (two) times daily. 360 capsule 1  . omeprazole (PRILOSEC) 20 MG capsule Take 20 mg by mouth daily. Reported on 07/16/2015    . pioglitazone (ACTOS) 15 MG tablet Take 1 tablet (15 mg total) by mouth daily. 90 tablet 1  . rosuvastatin (CRESTOR) 20 MG tablet Take 1 tablet (20 mg total) by mouth daily. 90 tablet 1  . sitaGLIPtin  (JANUVIA) 100 MG tablet Take 1 tablet (100 mg total) by mouth daily. 90 tablet 1   No facility-administered medications prior to visit.     Allergies:  Allergies  Allergen Reactions  . Niaspan [Niacin]     FLUSHING      Review of Systems: See HPI for pertinent ROS. All other ROS negative.    Physical Exam: Blood pressure 140/82, pulse 81, temperature 98 F (36.7 C), temperature source Oral, resp. rate 16, weight 180 lb 3.2 oz (81.7 kg), SpO2 98 %., Body mass index is 28.22 kg/m. General:  WNWD WM. Appears in no acute distress. Neck: Supple. No thyromegaly. No lymphadenopathy. Lungs: Clear bilaterally to auscultation without wheezes, rales, or rhonchi. Breathing is unlabored. Heart: Regular rhythm. No murmurs, rubs, or gallops. Msk:  Strength and tone normal for age. Extremities/Skin: On his upper thighs and on his buttocks--he has multiple sites of tic bites---each of this is small < 0.5cm diameter of light pink erythema. No firmness, no drainage. No other areas of rash.  Neuro: Alert and oriented X 3. Moves all extremities spontaneously. Gait is normal. CNII-XII grossly in tact. Psych:  Responds to questions appropriately with a normal affect.     ASSESSMENT AND PLAN:  66 y.o. year old male with  1. Tick bite, initial encounter Will give him triamcinolone cream to apply directly to the itchy tic bite sites. Also can use oral Benadryl at night to help with itching. At this time he has no indication to start doxycycline. I reviewed symptoms for him to watch for and if he develops any of these symptoms or fever or other areas of rash, then follow-up with [place] immediately. - triamcinolone cream (KENALOG) 0.1 %; Apply 1 application topically 2 (two) times daily.  Dispense: 30 g; Refill: 0   Signed, [address] [place], [place], [facility] 07/30/2016 9:44 AM

## 2016-10-13 ENCOUNTER — Other Ambulatory Visit: Payer: Self-pay | Admitting: Family Medicine

## 2017-01-26 ENCOUNTER — Other Ambulatory Visit: Payer: Self-pay

## 2017-01-26 ENCOUNTER — Ambulatory Visit: Payer: PPO | Admitting: Family Medicine

## 2017-01-26 ENCOUNTER — Encounter: Payer: Self-pay | Admitting: Family Medicine

## 2017-01-26 VITALS — BP 136/68 | HR 90 | Temp 98.7°F | Resp 14 | Ht 67.0 in | Wt 182.0 lb

## 2017-01-26 DIAGNOSIS — Z794 Long term (current) use of insulin: Secondary | ICD-10-CM

## 2017-01-26 DIAGNOSIS — J209 Acute bronchitis, unspecified: Secondary | ICD-10-CM | POA: Diagnosis not present

## 2017-01-26 DIAGNOSIS — E119 Type 2 diabetes mellitus without complications: Secondary | ICD-10-CM | POA: Diagnosis not present

## 2017-01-26 DIAGNOSIS — J320 Chronic maxillary sinusitis: Secondary | ICD-10-CM | POA: Diagnosis not present

## 2017-01-26 MED ORDER — GUAIFENESIN-CODEINE 100-10 MG/5ML PO SOLN: 5.0000 mL | Freq: Four times a day (QID) | ORAL | 0 refills | Status: DC | PRN

## 2017-01-26 MED ORDER — PREDNISONE 20 MG PO TABS: 40.0000 mg | ORAL_TABLET | Freq: Every day | ORAL | 0 refills | Status: DC

## 2017-01-26 MED ORDER — ALBUTEROL SULFATE HFA 108 (90 BASE) MCG/ACT IN AERS: INHALATION_SPRAY | RESPIRATORY_TRACT | 2 refills | Status: DC

## 2017-01-26 MED ORDER — AZITHROMYCIN 250 MG PO TABS: ORAL_TABLET | ORAL | 0 refills | Status: DC

## 2017-01-26 NOTE — Progress Notes (Signed)
Patient ID: Micheal Donovan, male   DOB: Jul 21, 1950, 66 y.o.   MRN: [id number]   Subjective:    Patient ID: Micheal Donovan, male    DOB: 1950/09/29, 66 y.o.   MRN: [id number]  Patient presents for Illness (x1 week- sinus pressure/ drainage, productive cough with green colored mucus, wheezing)   1 week ago started with sinus drainage now with cough and congestion, largyjitis. COugh is productivive, previous smoker, has ratteling in chest  And wheezing. Has chronic sinus problems, has had sinus surgeries. Uses nasal spray   Taking OTC sinus medication, PE decongetant, Nyquil    DM- last A1C  7% in Jan  2018, CBG have beenunder  200    Lantus 22 units, metformin, actos, - no longer taking Janvuia due to cost        HTN- taking losartan daily       Review Of Systems:  GEN- denies fatigue, fever, weight loss,weakness, recent illness HEENT- denies eye drainage, change in vision, nasal discharge, CVS- denies chest pain, palpitations RESP- denies SOB, cough, wheeze ABD- denies N/V, change in stools, abd pain GU- denies dysuria, hematuria, dribbling, incontinence MSK- denies joint pain, muscle aches, injury Neuro- denies headache, dizziness, syncope, seizure activity       Objective:    BP 136/68   Pulse 90   Temp 98.7 F (37.1 C) (Oral)   Resp 14   Ht 5\' 7"  (1.702 m)   Wt 182 lb (82.6 kg)   SpO2 94%   BMI 28.51 kg/m  GEN- NAD, alert and oriented x3 HEENT- PERRL, EOMI, non injected sclera, pink conjunctiva, MMM, oropharynx mild injection, TM clear bilat no effusion,  no maxillary sinus tenderness, inflammed turbinates,  Nasal drainage  Neck- Supple, no LAD, no JVD CVS- RRR, no murmur RESP- scattered wheeze, rhonchi bilat, normal WOB at rest, no retractions EXT- No edema Pulses- Radial 2+         Assessment & Plan:      Problem List Items Addressed This Visit      Unprioritized   Type 2 diabetes mellitus without complications ([place]) - Primary    Overdue for A1C on  insulin, advised to increase insulin by 1-2 units for CBG > 200 while on steroids Also unable to afford Januvia Given samples until blood work seen then will adjust       Relevant Orders   CBC with Differential/Platelet   Comprehensive metabolic panel   Hemoglobin A1c    Other Visit Diagnoses    Acute bronchitis, unspecified organism       Progressive symptoms started with sinusitis, now with bronchitis, which he has had in past. Treat zpak, prednisone, albuterol, robitussin AC.Continue nasalspray   Chronic maxillary sinusitis       Relevant Medications   azithromycin (ZITHROMAX) 250 MG tablet   predniSONE (DELTASONE) 20 MG tablet   guaiFENesin-codeine 100-10 MG/5ML syrup      Note: This dictation was prepared with Dragon dictation along with smaller phrase technology. Any transcriptional errors that result from this process are unintentional.

## 2017-01-26 NOTE — Assessment & Plan Note (Signed)
Overdue for A1C on insulin, advised to increase insulin by 1-2 units for CBG > 200 while on steroids Also unable to afford Januvia Given samples until blood work seen then will adjust

## 2017-01-26 NOTE — Patient Instructions (Addendum)
Take antibiotics  Take steroids  Albuterol inhaler  Cough medicine Increase lantus by 1-2 Units for CBG > 200 while on steroids  We will call with lab results F/U 3 MONTHS for Physical Dr. Dennard Schaumann

## 2017-01-27 LAB — COMPREHENSIVE METABOLIC PANEL
AG Ratio: 1.7 (calc) (ref 1.0–2.5)
ALBUMIN MSPROF: 4.3 g/dL (ref 3.6–5.1)
ALKALINE PHOSPHATASE (APISO): 94 U/L (ref 40–115)
ALT: 16 U/L (ref 9–46)
AST: 19 U/L (ref 10–35)
BILIRUBIN TOTAL: 0.7 mg/dL (ref 0.2–1.2)
BUN: 14 mg/dL (ref 7–25)
CALCIUM: 9.1 mg/dL (ref 8.6–10.3)
CO2: 29 mmol/L (ref 20–32)
CREATININE: 0.72 mg/dL (ref 0.70–1.25)
Chloride: 98 mmol/L (ref 98–110)
Globulin: 2.5 g/dL (calc) (ref 1.9–3.7)
Glucose, Bld: 145 mg/dL — ABNORMAL HIGH (ref 65–99)
POTASSIUM: 4.2 mmol/L (ref 3.5–5.3)
Sodium: 136 mmol/L (ref 135–146)
Total Protein: 6.8 g/dL (ref 6.1–8.1)

## 2017-01-27 LAB — CBC WITH DIFFERENTIAL/PLATELET
Basophils Absolute: 17 {cells}/uL (ref 0–200)
Basophils Relative: 0.2 %
Eosinophils Absolute: 67 {cells}/uL (ref 15–500)
Eosinophils Relative: 0.8 %
HCT: 36.8 % — ABNORMAL LOW (ref 38.5–50.0)
Hemoglobin: 13.4 g/dL (ref 13.2–17.1)
Lymphs Abs: 1134 {cells}/uL (ref 850–3900)
MCH: 33 pg (ref 27.0–33.0)
MCHC: 36.4 g/dL — ABNORMAL HIGH (ref 32.0–36.0)
MCV: 90.6 fL (ref 80.0–100.0)
MPV: 9.9 fL (ref 7.5–12.5)
Monocytes Relative: 8 %
Neutro Abs: 6510 {cells}/uL (ref 1500–7800)
Neutrophils Relative %: 77.5 %
Platelets: 278 Thousand/uL (ref 140–400)
RBC: 4.06 Million/uL — ABNORMAL LOW (ref 4.20–5.80)
RDW: 12.3 % (ref 11.0–15.0)
Total Lymphocyte: 13.5 %
WBC mixed population: 672 {cells}/uL (ref 200–950)
WBC: 8.4 Thousand/uL (ref 3.8–10.8)

## 2017-01-27 LAB — HEMOGLOBIN A1C
EAG (MMOL/L): 10 (calc)
Hgb A1c MFr Bld: 7.9 % of total Hgb — ABNORMAL HIGH (ref <5.7)
Mean Plasma Glucose: 180 (calc)

## 2017-04-25 ENCOUNTER — Other Ambulatory Visit: Payer: Self-pay | Admitting: Family Medicine

## 2017-04-29 ENCOUNTER — Other Ambulatory Visit: Payer: PPO

## 2017-04-29 DIAGNOSIS — Z0000 Encounter for general adult medical examination without abnormal findings: Secondary | ICD-10-CM | POA: Diagnosis not present

## 2017-04-29 DIAGNOSIS — Z794 Long term (current) use of insulin: Secondary | ICD-10-CM | POA: Diagnosis not present

## 2017-04-29 DIAGNOSIS — I10 Essential (primary) hypertension: Secondary | ICD-10-CM

## 2017-04-29 DIAGNOSIS — E119 Type 2 diabetes mellitus without complications: Secondary | ICD-10-CM | POA: Diagnosis not present

## 2017-04-29 DIAGNOSIS — Z125 Encounter for screening for malignant neoplasm of prostate: Secondary | ICD-10-CM | POA: Diagnosis not present

## 2017-04-29 DIAGNOSIS — E7800 Pure hypercholesterolemia, unspecified: Secondary | ICD-10-CM | POA: Diagnosis not present

## 2017-04-29 LAB — PSA: PSA: 1.4 ng/mL (ref <=4.0)

## 2017-05-04 ENCOUNTER — Ambulatory Visit (INDEPENDENT_AMBULATORY_CARE_PROVIDER_SITE_OTHER): Payer: PPO | Admitting: Family Medicine

## 2017-05-04 ENCOUNTER — Encounter: Payer: Self-pay | Admitting: Family Medicine

## 2017-05-04 ENCOUNTER — Telehealth: Payer: Self-pay | Admitting: Family Medicine

## 2017-05-04 VITALS — BP 136/72 | HR 74 | Temp 98.0°F | Resp 14 | Ht 67.0 in | Wt 184.0 lb

## 2017-05-04 DIAGNOSIS — Z794 Long term (current) use of insulin: Secondary | ICD-10-CM | POA: Diagnosis not present

## 2017-05-04 DIAGNOSIS — I6523 Occlusion and stenosis of bilateral carotid arteries: Secondary | ICD-10-CM | POA: Diagnosis not present

## 2017-05-04 DIAGNOSIS — Z1211 Encounter for screening for malignant neoplasm of colon: Secondary | ICD-10-CM

## 2017-05-04 DIAGNOSIS — E119 Type 2 diabetes mellitus without complications: Secondary | ICD-10-CM

## 2017-05-04 DIAGNOSIS — Z0000 Encounter for general adult medical examination without abnormal findings: Secondary | ICD-10-CM

## 2017-05-04 DIAGNOSIS — Z1159 Encounter for screening for other viral diseases: Secondary | ICD-10-CM

## 2017-05-04 DIAGNOSIS — I10 Essential (primary) hypertension: Secondary | ICD-10-CM | POA: Diagnosis not present

## 2017-05-04 LAB — CBC WITH DIFFERENTIAL/PLATELET
BASOS ABS: 32 {cells}/uL (ref 0–200)
Basophils Relative: 0.5 %
EOS PCT: 2.2 %
Eosinophils Absolute: 139 cells/uL (ref 15–500)
HEMATOCRIT: 43.1 % (ref 38.5–50.0)
HEMOGLOBIN: 14.3 g/dL (ref 13.2–17.1)
LYMPHS ABS: 2148 {cells}/uL (ref 850–3900)
MCH: 29.5 pg (ref 27.0–33.0)
MCHC: 33.2 g/dL (ref 32.0–36.0)
MCV: 89 fL (ref 80.0–100.0)
MPV: 9.9 fL (ref 7.5–12.5)
Monocytes Relative: 6.8 %
NEUTROS ABS: 3553 {cells}/uL (ref 1500–7800)
Neutrophils Relative %: 56.4 %
Platelets: 276 10*3/uL (ref 140–400)
RBC: 4.84 10*6/uL (ref 4.20–5.80)
RDW: 12.7 % (ref 11.0–15.0)
Total Lymphocyte: 34.1 %
WBC: 6.3 10*3/uL (ref 3.8–10.8)
WBCMIX: 428 {cells}/uL (ref 200–950)

## 2017-05-04 LAB — COMPREHENSIVE METABOLIC PANEL
AG RATIO: 1.9 (calc) (ref 1.0–2.5)
ALBUMIN MSPROF: 4.4 g/dL (ref 3.6–5.1)
ALKALINE PHOSPHATASE (APISO): 93 U/L (ref 40–115)
ALT: 17 U/L (ref 9–46)
AST: 17 U/L (ref 10–35)
BILIRUBIN TOTAL: 0.5 mg/dL (ref 0.2–1.2)
BUN: 14 mg/dL (ref 7–25)
CALCIUM: 10.1 mg/dL (ref 8.6–10.3)
CO2: 30 mmol/L (ref 20–32)
Chloride: 102 mmol/L (ref 98–110)
Creat: 0.95 mg/dL (ref 0.70–1.25)
Globulin: 2.3 g/dL (calc) (ref 1.9–3.7)
Glucose, Bld: 176 mg/dL — ABNORMAL HIGH (ref 65–99)
Potassium: 5.2 mmol/L (ref 3.5–5.3)
Sodium: 139 mmol/L (ref 135–146)
Total Protein: 6.7 g/dL (ref 6.1–8.1)

## 2017-05-04 LAB — HEMOGLOBIN A1C
EAG (MMOL/L): 10.8 (calc)
Hgb A1c MFr Bld: 8.4 % of total Hgb — ABNORMAL HIGH (ref <5.7)
MEAN PLASMA GLUCOSE: 194 (calc)

## 2017-05-04 LAB — HEPATITIS C ANTIBODY
Hepatitis C Ab: NONREACTIVE
SIGNAL TO CUT-OFF: 0.04 (ref <1.00)

## 2017-05-04 LAB — TEST AUTHORIZATION

## 2017-05-04 LAB — LIPID PANEL
CHOLESTEROL: 131 mg/dL (ref <200)
HDL: 30 mg/dL — ABNORMAL LOW (ref >40)
LDL Cholesterol (Calc): 81 mg/dL (calc)
Non-HDL Cholesterol (Calc): 101 mg/dL (calc) (ref <130)
Total CHOL/HDL Ratio: 4.4 (calc) (ref <5.0)
Triglycerides: 104 mg/dL (ref <150)

## 2017-05-04 MED ORDER — INSULIN GLARGINE 100 UNIT/ML SOLOSTAR PEN: 22.0000 [IU] | PEN_INJECTOR | SUBCUTANEOUS | 3 refills | Status: DC

## 2017-05-04 MED ORDER — PIOGLITAZONE HCL 15 MG PO TABS: 15.0000 mg | ORAL_TABLET | Freq: Every day | ORAL | 1 refills | Status: DC

## 2017-05-04 MED ORDER — OMEPRAZOLE 20 MG PO CPDR: 20.0000 mg | DELAYED_RELEASE_CAPSULE | Freq: Every day | ORAL | 3 refills | Status: DC

## 2017-05-04 MED ORDER — SITAGLIPTIN PHOSPHATE 100 MG PO TABS: 100.0000 mg | ORAL_TABLET | Freq: Every day | ORAL | 1 refills | Status: DC

## 2017-05-04 MED ORDER — ROSUVASTATIN CALCIUM 20 MG PO TABS: 20.0000 mg | ORAL_TABLET | Freq: Every day | ORAL | 1 refills | Status: DC

## 2017-05-04 MED ORDER — NIACIN ER (ANTIHYPERLIPIDEMIC) 500 MG PO TBCR: 500.0000 mg | EXTENDED_RELEASE_TABLET | Freq: Every day | ORAL | 3 refills | Status: DC

## 2017-05-04 MED ORDER — LOSARTAN POTASSIUM 50 MG PO TABS: 50.0000 mg | ORAL_TABLET | Freq: Every day | ORAL | 3 refills | Status: DC

## 2017-05-04 MED ORDER — METFORMIN HCL 500 MG PO TABS: 500.0000 mg | ORAL_TABLET | Freq: Two times a day (BID) | ORAL | 1 refills | Status: DC

## 2017-05-04 NOTE — Telephone Encounter (Signed)
Pt came in after app, states he needs refill on all of his medications ALL of them. To [facility] on [address]

## 2017-05-04 NOTE — Telephone Encounter (Signed)
Medication called/sent to requested pharmacy  

## 2017-05-04 NOTE — Progress Notes (Signed)
Subjective:    Patient ID: Micheal Donovan, male    DOB: Jun 11, 1950, 67 y.o.   MRN: [id number]  Medication Refill   03/2016 Patient is here today for follow-up of his diabetes. His hemoglobin A1c has dropped to 7.0. His LDL cholesterol has improved. He denies any polyuria, polydipsia, or blurred vision. He denies any myalgias or right upper quadrant pain. He denies any chest pain shortness of breath or dyspnea on exertion. He refuses a flu shot. We reviewed his lab work today and calculated his 10 year risk of ASCVD and found to be greater than 30% even with his cholesterol as low as it is.  At that time, my plan was: Patient's lab work actually looks pretty good. However he has a high ten-year risk of coronary disease due to his medical risk factors area therefore I recommended 30 minutes a day 5 days a week of aerobic exercise. Also recommended discontinuing Januvia and replacing with Jardiance dated evidence of reduction and cardiovascular morbidity. He will check on the price first. His cholesterol is well controlled as is his blood pressure. He declines a flu shot.  05/04/17 Patient has not been seen since.  Patient never called back to switch from [place] to [place].  Unfortunately his hemoglobin A1c has spiraled out of control and is now 8.4.  Diabetic foot exam was performed today and is significant only for onychomycosis on his right great toenail.  He has slightly diminished posterior tibialis and dorsalis pedis pulses in his left foot but normal sensation to 10 g monofilament.  He denies any chest pain shortness of breath or dyspnea on exertion.  He denies any polyuria, polydipsia, or blurry vision.  Blood pressures well controlled today at 136/72.  He denies any myalgias or right upper quadrant pain.  He is due for hepatitis C screening.  He refuses a flu shot.  He is due for a colonoscopy but refuses a colonoscopy even though I try to schedule him last year. Lab on 04/29/2017  Component Date  Value Ref Range Status  . WBC 04/29/2017 6.3  3.8 - 10.8 Thousand/uL Final  . RBC 04/29/2017 4.84  4.20 - 5.80 Million/uL Final  . Hemoglobin 04/29/2017 14.3  13.2 - 17.1 g/dL Final  . HCT 04/29/2017 43.1  38.5 - 50.0 % Final  . MCV 04/29/2017 89.0  80.0 - 100.0 fL Final  . MCH 04/29/2017 29.5  27.0 - 33.0 pg Final  . MCHC 04/29/2017 33.2  32.0 - 36.0 g/dL Final  . RDW 04/29/2017 12.7  11.0 - 15.0 % Final  . Platelets 04/29/2017 276  140 - 400 Thousand/uL Final  . MPV 04/29/2017 9.9  7.5 - 12.5 fL Final  . Neutro Abs 04/29/2017 3,553  1,500 - 7,800 cells/uL Final  . Lymphs Abs 04/29/2017 2,148  850 - 3,900 cells/uL Final  . WBC mixed population 04/29/2017 428  200 - 950 cells/uL Final  . Eosinophils Absolute 04/29/2017 139  15 - 500 cells/uL Final  . Basophils Absolute 04/29/2017 32  0 - 200 cells/uL Final  . Neutrophils Relative % 04/29/2017 56.4  % Final  . Total Lymphocyte 04/29/2017 34.1  % Final  . Monocytes Relative 04/29/2017 6.8  % Final  . Eosinophils Relative 04/29/2017 2.2  % Final  . Basophils Relative 04/29/2017 0.5  % Final  . Glucose, Bld 04/29/2017 176* 65 - 99 mg/dL Final   Comment: .            Fasting reference interval .  For someone without known diabetes, a glucose value >125 mg/dL indicates that they may have diabetes and this should be confirmed with a follow-up test. .   . BUN 04/29/2017 14  7 - 25 mg/dL Final  . Creat 04/29/2017 0.95  0.70 - 1.25 mg/dL Final   Comment: For patients >81 years of age, the reference limit for Creatinine is approximately 13% higher for people identified as African-American. .   Havery Moros Ratio 45/80/9983 NOT APPLICABLE  6 - 22 (calc) Final  . Sodium 04/29/2017 139  135 - 146 mmol/L Final  . Potassium 04/29/2017 5.2  3.5 - 5.3 mmol/L Final  . Chloride 04/29/2017 102  98 - 110 mmol/L Final  . CO2 04/29/2017 30  20 - 32 mmol/L Final  . Calcium 04/29/2017 10.1  8.6 - 10.3 mg/dL Final  . Total Protein 04/29/2017 6.7   6.1 - 8.1 g/dL Final  . Albumin 04/29/2017 4.4  3.6 - 5.1 g/dL Final  . Globulin 04/29/2017 2.3  1.9 - 3.7 g/dL (calc) Final  . AG Ratio 04/29/2017 1.9  1.0 - 2.5 (calc) Final  . Total Bilirubin 04/29/2017 0.5  0.2 - 1.2 mg/dL Final  . Alkaline phosphatase (APISO) 04/29/2017 93  40 - 115 U/L Final  . AST 04/29/2017 17  10 - 35 U/L Final  . ALT 04/29/2017 17  9 - 46 U/L Final  . Hgb A1c MFr Bld 04/29/2017 8.4* <5.7 % of total Hgb Final   Comment: For someone without known diabetes, a hemoglobin A1c value of 6.5% or greater indicates that they may have  diabetes and this should be confirmed with a follow-up  test. . For someone with known diabetes, a value <7% indicates  that their diabetes is well controlled and a value  greater than or equal to 7% indicates suboptimal  control. A1c targets should be individualized based on  duration of diabetes, age, comorbid conditions, and  other considerations. . Currently, no consensus exists regarding use of hemoglobin A1c for diagnosis of diabetes for children. .   . Mean Plasma Glucose 04/29/2017 194  (calc) Final  . eAG (mmol/L) 04/29/2017 10.8  (calc) Final  . Cholesterol 04/29/2017 131  <200 mg/dL Final  . HDL 04/29/2017 30* >40 mg/dL Final  . Triglycerides 04/29/2017 104  <150 mg/dL Final  . LDL Cholesterol (Calc) 04/29/2017 81  mg/dL (calc) Final   Comment: Reference range: <100 . Desirable range <100 mg/dL for primary prevention;   <70 mg/dL for patients with CHD or diabetic patients  with > or = 2 CHD risk factors. [name] LDL-C is now calculated using the Martin-Hopkins  calculation, which is a validated novel method providing  better accuracy than the Friedewald equation in the  estimation of LDL-C.  Cresenciano Genre et al. Annamaria Helling. 3825;053(97): 2061-2068  (http://education.[facility].com/faq/FAQ164)   . Total CHOL/HDL Ratio 04/29/2017 4.4  <5.0 (calc) Final  . Non-HDL Cholesterol (Calc) 04/29/2017 101  <130 mg/dL (calc) Final    Comment: For patients with diabetes plus 1 major ASCVD risk  factor, treating to a non-HDL-C goal of <100 mg/dL  (LDL-C of <70 mg/dL) is considered a therapeutic  option.   [name] PSA 04/29/2017 1.4  < OR = 4.0 ng/mL Final   Comment: The total PSA value from this assay system is  standardized against the WHO standard. The test  result will be approximately 20% lower when compared  to the equimolar-standardized total PSA (Beckman  Coulter). Comparison of serial PSA results should be  interpreted with  this fact in mind. . This test was performed using the Siemens  chemiluminescent method. Values obtained from  different assay methods cannot be used interchangeably. PSA levels, regardless of value, should not be interpreted as absolute evidence of the presence or absence of disease.   . TEST NAME: 04/29/2017 HEPATITIS C AB W/REFL TO   Final  . TEST CODE: 04/29/2017 [id number]   Final  . CLIENT CONTACT: 04/29/2017 WILES,KIM   Final  . REPORT ALWAYS MESSAGE SIGNATURE 04/29/2017    Final   Comment: . The laboratory testing on this patient was verbally requested or confirmed by the ordering physician or his or her authorized representative after contact with an employee of [employer]. Federal regulations require that we maintain on file written authorization for all laboratory testing.  Accordingly we are asking that the ordering physician or his or her authorized representative sign a copy of this report and promptly return it to the client service representative. . . Signature:____________________________________________________ . Please fax this signed page to [phone] or return it via your [employer] courier.    Past Medical History:  Diagnosis Date  . Allergy   . BPH (benign prostatic hypertrophy)   . Cancer ([place])    Melanoma on Neck    2008  . Carotid artery occlusion   . Diabetes mellitus   . ED (erectile dysfunction)   . Hyperlipidemia   . Hypertension     . Retinopathy due to secondary DM [facility])    Past Surgical History:  Procedure Laterality Date  . MELANOMA EXCISION  2008   Left side of neck   Current Outpatient Medications on File Prior to Visit  Medication Sig Dispense Refill  . aspirin 325 MG tablet Take 325 mg by mouth daily. Reported on 07/16/2015    . B-D UF III MINI PEN NEEDLES 31G X 5 MM MISC as directed 100 each 4  . glucose blood (ONE TOUCH ULTRA TEST) test strip TEST twice a day DX : E11.9 200 each 3  . omega-3 acid ethyl esters (LOVAZA) 1 g capsule TAKE 2 CAPSULES BY MOUTH TWICE A DAY 360 capsule 0   No current facility-administered medications on file prior to visit.    Allergies  Allergen Reactions  . Niaspan [Niacin]     FLUSHING   Social History   Socioeconomic History  . Marital status: Single    Spouse name: Not on file  . Number of children: Not on file  . Years of education: Not on file  . Highest education level: Not on file  Social Needs  . Financial resource strain: Not on file  . Food insecurity - worry: Not on file  . Food insecurity - inability: Not on file  . Transportation needs - medical: Not on file  . Transportation needs - non-medical: Not on file  Occupational History  . Not on file  Tobacco Use  . Smoking status: Former Smoker    Last attempt to quit: 07/21/1990    Years since quitting: 26.8  . Smokeless tobacco: Never Used  Substance and Sexual Activity  . Alcohol use: No  . Drug use: No  . Sexual activity: Not on file  Other Topics Concern  . Not on file  Social History Narrative  . Not on file   Family History  Problem Relation Age of Onset  . COPD Mother   . Heart disease Father   . Heart disease Brother        MI at age 81  Review of Systems  All other systems reviewed and are negative.      Objective:   Physical Exam  Constitutional: He is oriented to person, place, and time. He appears well-developed and well-nourished. No distress.  HENT:  Head:  Normocephalic and atraumatic.  Right Ear: External ear normal.  Left Ear: External ear normal.  Nose: Nose normal.  Mouth/Throat: Oropharynx is clear and moist. No oropharyngeal exudate.  Eyes: Conjunctivae and EOM are normal. Pupils are equal, round, and reactive to light. Right eye exhibits no discharge. Left eye exhibits no discharge. No scleral icterus.  Neck: Normal range of motion. Neck supple. No JVD present. No tracheal deviation present. No thyromegaly present.  Cardiovascular: Normal rate, regular rhythm, normal heart sounds and intact distal pulses. Exam reveals no gallop and no friction rub.  No murmur heard. Pulmonary/Chest: Effort normal and breath sounds normal. No stridor. No respiratory distress. He has no wheezes. He has no rales. He exhibits no tenderness.  Abdominal: Soft. Bowel sounds are normal. He exhibits no distension and no mass. There is no tenderness. There is no rebound and no guarding.  Musculoskeletal: Normal range of motion. He exhibits no edema.  Lymphadenopathy:    He has no cervical adenopathy.  Neurological: He is alert and oriented to person, place, and time. He has normal reflexes. No cranial nerve deficit. He exhibits normal muscle tone. Coordination normal.  Skin: Skin is warm. No rash noted. He is not diaphoretic. No erythema. No pallor.  Psychiatric: He has a normal mood and affect. His behavior is normal. Judgment and thought content normal.  Vitals reviewed.   Declines digital rectal exam      Assessment & Plan:  Encounter for hepatitis C screening test for low risk patient - Plan: Hepatitis C Antibody  Type 2 diabetes mellitus without complication, with long-term current use of insulin (HCC)  Benign essential HTN  Routine general medical examination at a health care facility  Colon cancer screening  Carotid stenosis, bilateral Patient refuses a flu shot today.  He refuses a colonoscopy but he states that he will allow me to schedule him  for cologuard.  Diabetic eye exam is up-to-date.  Diabetic foot exam is performed today.  PSA is within normal limits.  I will screen the patient for hepatitis C.  Blood pressures acceptable.  LDL cholesterol is acceptable.  HDL cholesterol is low and I continue to recommend daily aerobic exercise.  Had a long discussion today about his diabetes and ultimately I recommended adding victoza 1.2 mg daily.  I believe this would help the patient lose weight and better manage his postprandial hyperglycemia.  Patient refuses to add the medication today but will check on the price and then allow me to recheck his lab work in 3 months

## 2017-05-05 ENCOUNTER — Other Ambulatory Visit: Payer: Self-pay | Admitting: Family Medicine

## 2017-05-05 MED ORDER — INSULIN GLARGINE 100 UNIT/ML SOLOSTAR PEN: 22.0000 [IU] | PEN_INJECTOR | SUBCUTANEOUS | 3 refills | Status: DC

## 2017-05-05 NOTE — Telephone Encounter (Signed)
rx sent to pharmacy

## 2017-05-05 NOTE — Telephone Encounter (Signed)
Wants 90 day supply on lantus to [facility] [place]. We had sent in 30 day supply.

## 2017-05-06 ENCOUNTER — Telehealth: Payer: Self-pay | Admitting: Family Medicine

## 2017-05-06 MED ORDER — DULAGLUTIDE 1.5 MG/0.5ML ~~LOC~~ SOAJ: 1.5000 mg | SUBCUTANEOUS | 1 refills | Status: DC

## 2017-05-06 NOTE — Telephone Encounter (Signed)
Pt called his in and they will cover Trulicity - give pt samples of the .75mg  to use for 2 weeks and then sent rx for 1.5mg  dose.

## 2017-05-22 ENCOUNTER — Other Ambulatory Visit: Payer: Self-pay | Admitting: Family Medicine

## 2017-05-25 MED ORDER — INSULIN GLARGINE 100 UNIT/ML SOLOSTAR PEN: 50.0000 [IU] | PEN_INJECTOR | SUBCUTANEOUS | 3 refills | Status: DC

## 2017-06-02 DIAGNOSIS — M5136 Other intervertebral disc degeneration, lumbar region: Secondary | ICD-10-CM | POA: Diagnosis not present

## 2017-06-02 DIAGNOSIS — M545 Low back pain: Secondary | ICD-10-CM | POA: Diagnosis not present

## 2017-07-31 ENCOUNTER — Other Ambulatory Visit: Payer: Self-pay | Admitting: Family Medicine

## 2017-08-05 ENCOUNTER — Other Ambulatory Visit: Payer: Self-pay | Admitting: Family Medicine

## 2017-08-23 ENCOUNTER — Other Ambulatory Visit: Payer: Self-pay | Admitting: Family Medicine

## 2017-10-12 ENCOUNTER — Other Ambulatory Visit: Payer: Self-pay | Admitting: Family Medicine

## 2017-10-12 MED ORDER — GLUCOSE BLOOD VI STRP: ORAL_STRIP | 3 refills | Status: DC

## 2018-01-29 ENCOUNTER — Other Ambulatory Visit: Payer: Self-pay | Admitting: Family Medicine

## 2018-01-31 ENCOUNTER — Other Ambulatory Visit: Payer: Self-pay | Admitting: Family Medicine

## 2018-02-01 ENCOUNTER — Other Ambulatory Visit: Payer: Self-pay | Admitting: Family Medicine

## 2018-03-08 ENCOUNTER — Other Ambulatory Visit: Payer: Self-pay

## 2018-03-08 MED ORDER — INSULIN GLARGINE 100 UNIT/ML SOLOSTAR PEN: 50.0000 [IU] | PEN_INJECTOR | SUBCUTANEOUS | 0 refills | Status: DC

## 2018-03-09 ENCOUNTER — Other Ambulatory Visit: Payer: Self-pay | Admitting: *Deleted

## 2018-03-09 MED ORDER — INSULIN GLARGINE 100 UNIT/ML SOLOSTAR PEN: 50.0000 [IU] | PEN_INJECTOR | SUBCUTANEOUS | 0 refills | Status: DC

## 2018-03-22 ENCOUNTER — Other Ambulatory Visit: Payer: Self-pay | Admitting: Family Medicine

## 2018-04-16 ENCOUNTER — Other Ambulatory Visit: Payer: Self-pay | Admitting: Family Medicine

## 2018-05-05 ENCOUNTER — Other Ambulatory Visit: Payer: Self-pay | Admitting: Family Medicine

## 2018-05-05 ENCOUNTER — Encounter: Payer: Self-pay | Admitting: Family Medicine

## 2018-07-17 ENCOUNTER — Other Ambulatory Visit: Payer: Self-pay | Admitting: Family Medicine

## 2018-07-21 ENCOUNTER — Other Ambulatory Visit: Payer: Self-pay | Admitting: Family Medicine

## 2018-08-06 ENCOUNTER — Other Ambulatory Visit: Payer: Self-pay | Admitting: Family Medicine

## 2018-08-07 ENCOUNTER — Other Ambulatory Visit: Payer: Self-pay | Admitting: Family Medicine

## 2018-08-11 ENCOUNTER — Other Ambulatory Visit: Payer: Self-pay | Admitting: *Deleted

## 2018-10-01 ENCOUNTER — Other Ambulatory Visit: Payer: Self-pay

## 2018-12-02 ENCOUNTER — Other Ambulatory Visit: Payer: Self-pay

## 2018-12-02 ENCOUNTER — Ambulatory Visit (INDEPENDENT_AMBULATORY_CARE_PROVIDER_SITE_OTHER): Payer: PPO | Admitting: Family Medicine

## 2018-12-02 ENCOUNTER — Encounter: Payer: Self-pay | Admitting: Family Medicine

## 2018-12-02 VITALS — BP 170/86 | HR 70 | Temp 98.1°F | Resp 16 | Ht 67.0 in | Wt 158.0 lb

## 2018-12-02 DIAGNOSIS — L299 Pruritus, unspecified: Secondary | ICD-10-CM

## 2018-12-02 MED ORDER — PREDNISONE 20 MG PO TABS: ORAL_TABLET | ORAL | 0 refills | Status: DC

## 2018-12-02 MED ORDER — PERMETHRIN 5 % EX CREA: 1.0000 "application " | TOPICAL_CREAM | Freq: Once | CUTANEOUS | 0 refills | Status: AC

## 2018-12-02 MED ORDER — LANTUS SOLOSTAR 100 UNIT/ML ~~LOC~~ SOPN: PEN_INJECTOR | SUBCUTANEOUS | 0 refills | Status: DC

## 2018-12-02 NOTE — Progress Notes (Signed)
Subjective:    Patient ID: Micheal Donovan, male    DOB: 01-23-1951, 68 y.o.   MRN: [id number]  HPI Patient is itching all over his body.  He states that his dog sat on his lap 1 week ago and shortly thereafter he started noticing erythematous papules.  They formed on his abdomen.  They are approximately 10 over his abdomen and around his umbilicus.  There are also numerous 2 mm erythematous papules with excoriations around his penis near his scrotum on his anterior and posterior thighs.  There are also on his arms.  He states that he is itching all over.  Aside from the exposure to his dog, he denies any other potential exposure.  He denies any other sick contacts in his family.  He denies any fevers or chills or systemic symptoms.  He is extremely itchy all over.  There is no jaundice on his exam today.  Patient has stopped all of his medication.  He states that his blood sugars are in the 200s.  I explained that prednisone would help with itching but will drive his blood sugar out of control if he does not resume his insulin.  Patient is willing to accept the prednisone and monitor his blood sugar and start taking his insulin again because the itching is severe. Past Medical History:  Diagnosis Date  . Allergy   . BPH (benign prostatic hypertrophy)   . Cancer ([place])    Melanoma on Neck    2008  . Carotid artery occlusion   . Diabetes mellitus   . ED (erectile dysfunction)   . Hyperlipidemia   . Hypertension   . 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 MELANOMA EXCISION  2008   Left side of neck   Current Outpatient Medications on File Prior to Visit  Medication Sig Dispense Refill  . Dulaglutide (TRULICITY) 1.5 TW/6.5KC SOPN Inject 1.5 mg into the skin once a week. 12 pen 1  . metFORMIN (GLUCOPHAGE) 500 MG tablet Take 1 tablet (500 mg total) by mouth 2 (two) times daily with a meal. 180 tablet 1  . aspirin 325 MG tablet Take 325 mg by  mouth daily. Reported on 07/16/2015    . B-D UF III MINI PEN NEEDLES 31G X 5 MM MISC as directed (Patient not taking: Reported on 12/02/2018) 100 each 4  . glucose blood (ONE TOUCH ULTRA TEST) test strip TEST twice a day DX : E11.9 (Patient not taking: Reported on 12/02/2018) 200 each 3  . losartan (COZAAR) 50 MG tablet TAKE 1 TABLET(50 MG) BY MOUTH DAILY (Patient not taking: Reported on 12/02/2018) 30 tablet 0  . niacin (NIASPAN) 500 MG CR tablet TAKE 1 TABLET(500 MG) BY MOUTH AT BEDTIME (Patient not taking: Reported on 12/02/2018) 30 tablet 0  . omega-3 acid ethyl esters (LOVAZA) 1 g capsule TAKE 2 CAPSULES BY MOUTH TWICE A DAY (Patient not taking: Reported on 12/02/2018) 120 capsule 0  . omeprazole (PRILOSEC) 20 MG capsule TAKE 1 CAPSULE BY MOUTH DAILY (Patient not taking: Reported on 12/02/2018) 30 capsule 0  . pioglitazone (ACTOS) 15 MG tablet TAKE 1 TABLET(15 MG) BY MOUTH DAILY (Patient not taking: Reported on 12/02/2018) 90 tablet 1  . rosuvastatin (CRESTOR) 20 MG tablet TAKE 1 TABLET(20 MG) BY MOUTH DAILY (Patient not taking: Reported on 12/02/2018) 30 tablet 0  . sitaGLIPtin (JANUVIA) 100 MG tablet Take 1 tablet (100 mg total) by mouth daily. (Patient  not taking: Reported on 12/02/2018) 90 tablet 1   No current facility-administered medications on file prior to visit.    Allergies  Allergen Reactions  . Niaspan [Niacin]     FLUSHING   Social History   Socioeconomic History  . Marital status: Single    Spouse name: Not on file  . Number of children: Not on file  . Years of education: Not on file  . Highest education level: Not on file  Occupational History  . Not on file  Social Needs  . Financial resource strain: Not on file  . Food insecurity    Worry: Not on file    Inability: Not on file  . Transportation needs    Medical: Not on file    Non-medical: Not on file  Tobacco Use  . Smoking status: Former Smoker    Quit date: 07/21/1990    Years since quitting: 28.3  . Smokeless  tobacco: Never Used  Substance and Sexual Activity  . Alcohol use: No  . Drug use: No  . Sexual activity: Not on file  Lifestyle  . Physical activity    Days per week: Not on file    Minutes per session: Not on file  . Stress: Not on file  Relationships  . Social Herbalist on phone: Not on file    Gets together: Not on file    Attends religious service: Not on file    Active member of club or organization: Not on file    Attends meetings of clubs or organizations: Not on file    Relationship status: Not on file  . Intimate partner violence    Fear of current or ex partner: Not on file    Emotionally abused: Not on file    Physically abused: Not on file    Forced sexual activity: Not on file  Other Topics Concern  . Not on file  Social History Narrative  . Not on file      Review of Systems  All other systems reviewed and are negative.      Objective:   Physical Exam Vitals signs reviewed.  Cardiovascular:     Rate and Rhythm: Normal rate and regular rhythm.     Heart sounds: Normal heart sounds.  Pulmonary:     Effort: Pulmonary effort is normal.     Breath sounds: Normal breath sounds. No wheezing or rales.  Skin:    Findings: Rash present.                Assessment & Plan:  Pruritus  I suspect scabies.  Apply Elimite cream head to toe and rinse off after 8 hours.  May repeat again in 1 week if persistent.  Gave the patient a prednisone taper pack due to the severity of the itching as he is miserable.  Recommended that he start taking his insulin again and explained how prednisone will raise his blood sugar.  Gave the patient a refill on his Lantus.  Recheck in 1 week or sooner if worse

## 2019-01-18 ENCOUNTER — Other Ambulatory Visit: Payer: Self-pay | Admitting: Family Medicine

## 2019-02-20 ENCOUNTER — Other Ambulatory Visit: Payer: Self-pay | Admitting: Family Medicine

## 2019-03-24 ENCOUNTER — Other Ambulatory Visit: Payer: Self-pay | Admitting: Family Medicine

## 2019-05-19 ENCOUNTER — Other Ambulatory Visit: Payer: Self-pay | Admitting: Family Medicine

## 2019-07-15 ENCOUNTER — Other Ambulatory Visit: Payer: Self-pay | Admitting: Family Medicine

## 2019-09-08 ENCOUNTER — Other Ambulatory Visit: Payer: Self-pay | Admitting: Family Medicine

## 2019-09-27 ENCOUNTER — Other Ambulatory Visit: Payer: Self-pay

## 2019-11-02 ENCOUNTER — Other Ambulatory Visit: Payer: Self-pay | Admitting: Nurse Practitioner

## 2019-11-17 ENCOUNTER — Other Ambulatory Visit: Payer: Self-pay | Admitting: Family Medicine

## 2020-01-07 ENCOUNTER — Other Ambulatory Visit: Payer: Self-pay | Admitting: Family Medicine

## 2020-01-09 NOTE — Telephone Encounter (Signed)
Send to PCP.

## 2020-01-09 NOTE — Telephone Encounter (Signed)
I have called pt and informed him that he has not been here over a year and we would like to see him. He stated that he would like to come in tomorrow to get blood work done before making an office visit. I stated understanding and will route message to the provider so he can future order the blood work. Pt plans on coming in tomorrow to get it drawn.  Thanks

## 2020-01-10 DIAGNOSIS — M5417 Radiculopathy, lumbosacral region: Secondary | ICD-10-CM | POA: Diagnosis not present

## 2020-01-10 DIAGNOSIS — M545 Low back pain, unspecified: Secondary | ICD-10-CM | POA: Diagnosis not present

## 2020-02-10 ENCOUNTER — Other Ambulatory Visit: Payer: Self-pay | Admitting: Orthopedic Surgery

## 2020-02-10 DIAGNOSIS — W3400XA Accidental discharge from unspecified firearms or gun, initial encounter: Secondary | ICD-10-CM

## 2020-02-10 DIAGNOSIS — M5459 Other low back pain: Secondary | ICD-10-CM

## 2020-02-26 ENCOUNTER — Other Ambulatory Visit: Payer: Self-pay | Admitting: Family Medicine

## 2020-02-29 DIAGNOSIS — Z20822 Contact with and (suspected) exposure to covid-19: Secondary | ICD-10-CM | POA: Diagnosis not present

## 2020-03-12 ENCOUNTER — Ambulatory Visit
Admission: RE | Admit: 2020-03-12 | Discharge: 2020-03-12 | Disposition: A | Discharge status: Home / Self Care | Payer: PPO | Source: Ambulatory Visit | Attending: Orthopedic Surgery | Admitting: Orthopedic Surgery

## 2020-03-12 ENCOUNTER — Other Ambulatory Visit: Payer: Self-pay | Admitting: Orthopedic Surgery

## 2020-03-12 DIAGNOSIS — Z1810 Retained metal fragments, unspecified: Secondary | ICD-10-CM | POA: Diagnosis not present

## 2020-03-12 DIAGNOSIS — M795 Residual foreign body in soft tissue: Secondary | ICD-10-CM | POA: Diagnosis not present

## 2020-03-12 DIAGNOSIS — M5459 Other low back pain: Secondary | ICD-10-CM

## 2020-03-12 DIAGNOSIS — W3400XA Accidental discharge from unspecified firearms or gun, initial encounter: Secondary | ICD-10-CM

## 2020-03-15 ENCOUNTER — Other Ambulatory Visit (HOSPITAL_COMMUNITY): Payer: Self-pay | Admitting: Orthopedic Surgery

## 2020-03-15 DIAGNOSIS — M5459 Other low back pain: Secondary | ICD-10-CM

## 2020-04-02 ENCOUNTER — Other Ambulatory Visit: Payer: Self-pay

## 2020-04-02 ENCOUNTER — Ambulatory Visit (HOSPITAL_COMMUNITY)
Admission: RE | Admit: 2020-04-02 | Discharge: 2020-04-02 | Disposition: A | Discharge status: Home / Self Care | Payer: PPO | Source: Ambulatory Visit | Attending: Orthopedic Surgery | Admitting: Orthopedic Surgery

## 2020-04-02 ENCOUNTER — Encounter (HOSPITAL_COMMUNITY): Payer: Self-pay

## 2020-04-02 DIAGNOSIS — Z01812 Encounter for preprocedural laboratory examination: Secondary | ICD-10-CM | POA: Insufficient documentation

## 2020-04-02 DIAGNOSIS — Z20822 Contact with and (suspected) exposure to covid-19: Secondary | ICD-10-CM | POA: Diagnosis not present

## 2020-04-02 LAB — SARS CORONAVIRUS 2 (TAT 6-24 HRS): SARS Coronavirus 2: NEGATIVE

## 2020-04-02 NOTE — Progress Notes (Signed)
Called [facility] - notified that we updated H&P within 30 days was needed by pt provider Arnette Norris, PA. Office provided with fax number [phone] and short stay number [phone] for any additional questions. Per front desk, updated H&P will be sent over via fax.

## 2020-04-02 NOTE — Progress Notes (Signed)
PCP - Dr. Dennard Schaumann  Cardiologist - denies   Fasting Blood Sugar:  180-220 Checks Blood Sugar:  Every morning when pt wakes up  COVID TEST- 04/02/20  Anesthesia review: n/a  -------------  SDW INSTRUCTIONS:  Your procedure is scheduled on Thursday 04/05/20. Please report to [facility] Main Entrance "A" at 07:30 A.M., and check in at the Admitting office. Call this number if you have problems the morning of surgery: [phone]   Remember: Do not eat or drink after midnight the night before your surgery No medications needed morning of surgery   LANTUS SOLOSTAR  AM day before surgery - 28 units Morning of surgery - 14 units day of surgery  NO oral diabetic medications morning of surgery - DO NOT take metformin morning of surgery  ** PLEASE check your blood sugar the morning of your surgery when you wake up and every 2 hours until you get to the Short Stay unit.  If your blood sugar is less than 70 mg/dL, you will need to treat for low blood sugar: - Do not take insulin. - Treat a low blood sugar (less than 70 mg/dL) with  cup of clear juice (cranberry or apple), 4 glucose tablets, OR glucose gel. - Recheck blood sugar in 15 minutes after treatment (to make sure it is greater than 70 mg/dL). If your blood sugar is not greater than 70 mg/dL on recheck, call [phone] for further instructions.  As of today, STOP taking any Aspirin (unless otherwise instructed by your surgeon), Aleve, Naproxen, Ibuprofen, Motrin, Advil, Goody's, BC's, all herbal medications, fish oil, and all vitamins.    The Morning of Surgery Do not wear jewelry Do not wear lotions, powders, colognes, or deodorant Men may shave face and neck. Do not bring valuables to the hospital. [facility] is not responsible for any belongings or valuables. If you are a smoker, DO NOT Smoke 24 hours prior to surgery If you wear a CPAP at night please bring your mask the morning of surgery  Remember that you must have  someone to transport you home after your surgery, and remain with you for 24 hours if you are discharged the same day. Please bring cases for contacts, glasses, hearing aids, dentures or bridgework because it cannot be worn into surgery.   Patients discharged the day of surgery will not be allowed to drive home.   Please shower the NIGHT BEFORE SURGERY and the MORNING OF SURGERY with DIAL Soap. Wear comfortable clothes the morning of surgery. Oral Hygiene is also important to reduce your risk of infection.  Remember - BRUSH YOUR TEETH THE MORNING OF SURGERY WITH YOUR REGULAR TOOTHPASTE  Patient denies shortness of breath, fever, cough and chest pain.

## 2020-04-04 ENCOUNTER — Ambulatory Visit: Payer: Self-pay | Admitting: Orthopedic Surgery

## 2020-04-04 NOTE — H&P (Deleted)
  The note originally documented on this encounter has been moved the the encounter in which it belongs.  

## 2020-04-04 NOTE — H&P (Signed)
Micheal Donovan is an 70 y.o. male.   Chief Complaint: back and leg pain HPI: For location (lower extremity), patient reports lower back pain __ and leg pain bilateral, __, __. For aggravating factors, he reports sitting for __. Reports a flareup in his back pain, saw Dr. Tonita Cong for similar symptoms 2 years ago which resolved with prednisone. He had a flareup that started last week following a 4 hour car ride, started experiencing right sided back, buttock and thigh pain and then on his way back home later in the week started experiencing left-sided radicular pain. He started prednisone over the weekend, has 2 days of that left, that does seem to be helping, in fact his right leg pain is resolved and he is now just having left-sided symptoms. He reports difficulty sleeping at night because the buttock throbs and he is unable to get comfortable in any position.  Past Medical History:  Diagnosis Date  . Allergy   . BPH (benign prostatic hypertrophy)   . Cancer ([place])    Melanoma on Neck    2008  . Carotid artery occlusion   . Diabetes mellitus   . ED (erectile dysfunction)   . Hyperlipidemia   . Hypertension   . Retinopathy due to secondary DM [facility])     Past Surgical History:  Procedure Laterality Date  . MELANOMA EXCISION  2008   Left side of neck  . TONSILLECTOMY      Family History  Problem Relation Age of Onset  . COPD Mother   . Heart disease Father   . Heart disease Brother        MI at age 62   Social History:  reports that he quit smoking about 29 years ago. He has never used smokeless tobacco. He reports that he does not drink alcohol and does not use drugs.  Medications: aspirin gabapentin 300 mg capsule Januvia 100 mg tablet Lantus Solostar U-100 Insulin 100 unit/mL (3 mL) subcutaneous pen Losartan 50mg  tablet metFORMIN 500 mg tablet niacin ER 500 mg tablet,extended release 24 hr omega-3 acid ethyl esters 1 gram capsule pioglitazone 15 mg tablet predniSONE 5 mg  tablets in a dose pack PriLOSEC rosuvastatin 20 mg tablet  Allergies:  Allergies  Allergen Reactions  . Niaspan [Niacin]     FLUSHING    (Not in a hospital admission)   Results for orders placed or performed during the hospital encounter of 04/02/20 (from the past 48 hour(s))  SARS CORONAVIRUS 2 (TAT 6-24 HRS) Nasopharyngeal Nasopharyngeal Swab     Status: None   Collection Time: 04/02/20 10:34 AM   Specimen: Nasopharyngeal Swab  Result Value Ref Range   SARS Coronavirus 2 NEGATIVE NEGATIVE    Comment: (NOTE) SARS-CoV-2 target nucleic acids are NOT DETECTED.  The SARS-CoV-2 RNA is generally detectable in upper and lower respiratory specimens during the acute phase of infection. Negative results do not preclude SARS-CoV-2 infection, do not rule out co-infections with other pathogens, and should not be used as the sole basis for treatment or other patient management decisions. Negative results must be combined with clinical observations, patient history, and epidemiological information. The expected result is Negative.  Fact Sheet for Patients: SugarRoll.be  Fact Sheet for Healthcare Providers: https://www.woods-mathews.com/  This test is not yet approved or cleared by the [place] FDA and  has been authorized for detection and/or diagnosis of SARS-CoV-2 by FDA under an Emergency Use Authorization (EUA). This EUA will remain  in effect (meaning this test can be  used) for the duration of the COVID-19 declaration under Se ction 564(b)(1) of the Act, 21 U.S.C. section 360bbb-3(b)(1), unless the authorization is terminated or revoked sooner.  Performed at [facility], [place] [address], [place], [place] [address]    No results found.  Review of Systems  There were no vitals taken for this visit. Physical Exam Constitutional:      Appearance: Normal appearance.  HENT:     Head: Normocephalic.     Right Ear: External  ear normal.     Left Ear: External ear normal.     Nose: Nose normal.     Mouth/Throat:     Mouth: Mucous membranes are moist.  Eyes:     Conjunctiva/sclera: Conjunctivae normal.  Cardiovascular:     Rate and Rhythm: Normal rate.     Pulses: Normal pulses.  Pulmonary:     Effort: Pulmonary effort is normal.  Abdominal:     General: Bowel sounds are normal.  Musculoskeletal:     Cervical back: Normal range of motion.     Comments: Patient is awake, alert, and oriented 3. Well-nourished and well-developed. Pleasant and in no acute distress. Normal gait with no assistive devices. Seated, appears comfortable.   On examination of the lumbar spine, nontender to palpation through the spinous processes. Tender left sided paraspinous musculature and left buttock. Nontender over the greater trochanter of the hips. Decreased flexion and extension lumbar spine. Seated straight leg raise on the left reproduces some buttock pain, negative on the right No lower extremity weakness noted, 5/5 throughout the hip flexor, quads, hamstrings, plantar and dorsiflexion, and EHL. No groin pain with rotation of the hips bilaterally. Patellar and Achilles reflexes 2+. No clonus present, negative Babinski. Sensation intact distally. No calf pain or sign of DVT.   Skin:    General: Skin is warm.  Neurological:     Mental Status: He is alert.     X-rays ordered, obtained, reviewed today with no fracture, subluxation, dislocation, lytic or blastic lesions. Aortic calcification noted, disc degeneration which is most significant at L5-S1. No listhesis or instability noted. Hips are unremarkable.  Assessment/Plan Impression: Back and left buttock lumbar radiculitis, discogenic in nature following a long drive, some improvement with prednisone  Plan:Discussed relevant anatomy and etiology of symptoms. Discussed the importance of activity modifications, core strengthening and core motion to prevent exacerbations. ADL  sheet reviewed and given. We discussed options. I would recommend he continues to finish out his course of prednisone as that does seem to be helping, he could even consider a second round if needed. I will start him on gabapentin which he can titrate up to 3 times a day depending upon his tolerance for it, to help especially with the nighttime nerve pain he is experiencing. If refractory or worsening I advised him to call, we discussed possible need for an MRI for further workup.  Pt has been unable to tolerate MRI with PO meds  Plan MRI Lspine under anesthesia  Cecilie Kicks, PA-C  For Dr. Tonita Cong 04/04/2020, 9:11 AM

## 2020-04-05 ENCOUNTER — Encounter (HOSPITAL_COMMUNITY): Payer: Self-pay | Admitting: Certified Registered Nurse Anesthetist

## 2020-04-05 ENCOUNTER — Other Ambulatory Visit: Payer: Self-pay

## 2020-04-05 ENCOUNTER — Encounter (HOSPITAL_COMMUNITY)
Admission: RE | Disposition: A | Discharge status: Home / Self Care | Payer: Self-pay | Source: Ambulatory Visit | Attending: Orthopedic Surgery

## 2020-04-05 ENCOUNTER — Ambulatory Visit (HOSPITAL_COMMUNITY)
Admission: RE | Admit: 2020-04-05 | Discharge: 2020-04-05 | Disposition: A | Discharge status: Home / Self Care | Payer: PPO | Source: Ambulatory Visit | Attending: Orthopedic Surgery | Admitting: Orthopedic Surgery

## 2020-04-05 ENCOUNTER — Ambulatory Visit (HOSPITAL_COMMUNITY): Payer: PPO | Admitting: Certified Registered Nurse Anesthetist

## 2020-04-05 DIAGNOSIS — M5459 Other low back pain: Secondary | ICD-10-CM

## 2020-04-05 DIAGNOSIS — Z888 Allergy status to other drugs, medicaments and biological substances status: Secondary | ICD-10-CM | POA: Diagnosis not present

## 2020-04-05 DIAGNOSIS — I779 Disorder of arteries and arterioles, unspecified: Secondary | ICD-10-CM | POA: Insufficient documentation

## 2020-04-05 DIAGNOSIS — Z8249 Family history of ischemic heart disease and other diseases of the circulatory system: Secondary | ICD-10-CM | POA: Diagnosis not present

## 2020-04-05 DIAGNOSIS — Z87891 Personal history of nicotine dependence: Secondary | ICD-10-CM | POA: Diagnosis not present

## 2020-04-05 DIAGNOSIS — E785 Hyperlipidemia, unspecified: Secondary | ICD-10-CM | POA: Diagnosis not present

## 2020-04-05 DIAGNOSIS — M48061 Spinal stenosis, lumbar region without neurogenic claudication: Secondary | ICD-10-CM | POA: Diagnosis not present

## 2020-04-05 DIAGNOSIS — E11319 Type 2 diabetes mellitus with unspecified diabetic retinopathy without macular edema: Secondary | ICD-10-CM | POA: Insufficient documentation

## 2020-04-05 DIAGNOSIS — M5117 Intervertebral disc disorders with radiculopathy, lumbosacral region: Secondary | ICD-10-CM | POA: Diagnosis not present

## 2020-04-05 DIAGNOSIS — E119 Type 2 diabetes mellitus without complications: Secondary | ICD-10-CM | POA: Diagnosis not present

## 2020-04-05 DIAGNOSIS — M545 Low back pain, unspecified: Secondary | ICD-10-CM | POA: Diagnosis not present

## 2020-04-05 DIAGNOSIS — I10 Essential (primary) hypertension: Secondary | ICD-10-CM | POA: Diagnosis not present

## 2020-04-05 DIAGNOSIS — Z85820 Personal history of malignant melanoma of skin: Secondary | ICD-10-CM | POA: Diagnosis not present

## 2020-04-05 DIAGNOSIS — E7800 Pure hypercholesterolemia, unspecified: Secondary | ICD-10-CM | POA: Diagnosis not present

## 2020-04-05 HISTORY — PX: RADIOLOGY WITH ANESTHESIA: SHX6223

## 2020-04-05 LAB — BASIC METABOLIC PANEL
Anion gap: 12 (ref 5–15)
BUN: 12 mg/dL (ref 8–23)
CO2: 24 mmol/L (ref 22–32)
Calcium: 9.2 mg/dL (ref 8.9–10.3)
Chloride: 100 mmol/L (ref 98–111)
Creatinine, Ser: 0.81 mg/dL (ref 0.61–1.24)
GFR, Estimated: >60 mL/min (ref >60)
Glucose, Bld: 208 mg/dL — ABNORMAL HIGH (ref 70–99)
Potassium: 3.9 mmol/L (ref 3.5–5.1)
Sodium: 136 mmol/L (ref 135–145)

## 2020-04-05 LAB — GLUCOSE, CAPILLARY
Glucose-Capillary: 233 mg/dL — ABNORMAL HIGH (ref 70–99)
Glucose-Capillary: 174 mg/dL — ABNORMAL HIGH (ref 70–99)

## 2020-04-05 SURGERY — MRI WITH ANESTHESIA
Anesthesia: General

## 2020-04-05 MED ORDER — CHLORHEXIDINE GLUCONATE 0.12 % MT SOLN
15.0000 mL | OROMUCOSAL | Status: AC
  Administered 2020-04-05: 15 mL via OROMUCOSAL
  Filled 2020-04-05: qty 15

## 2020-04-05 MED ORDER — OXYCODONE HCL 5 MG/5ML PO SOLN: 5.0000 mg | Freq: Once | ORAL | Status: DC | PRN

## 2020-04-05 MED ORDER — FENTANYL CITRATE (PF) 100 MCG/2ML IJ SOLN: 25.0000 ug | INTRAMUSCULAR | Status: DC | PRN

## 2020-04-05 MED ORDER — LIDOCAINE 2% (20 MG/ML) 5 ML SYRINGE
INTRAMUSCULAR | Status: DC | PRN
  Administered 2020-04-05: 50 mg via INTRAVENOUS

## 2020-04-05 MED ORDER — LACTATED RINGERS IV SOLN: INTRAVENOUS | Status: DC

## 2020-04-05 MED ORDER — FENTANYL CITRATE (PF) 250 MCG/5ML IJ SOLN
INTRAMUSCULAR | Status: DC | PRN
  Administered 2020-04-05: 25 ug via INTRAVENOUS

## 2020-04-05 MED ORDER — MIDAZOLAM HCL 2 MG/2ML IJ SOLN
INTRAMUSCULAR | Status: DC | PRN
  Administered 2020-04-05: 2 mg via INTRAVENOUS

## 2020-04-05 MED ORDER — PROPOFOL 10 MG/ML IV BOLUS
INTRAVENOUS | Status: DC | PRN
  Administered 2020-04-05: 170 mg via INTRAVENOUS
  Administered 2020-04-05: 50 mg via INTRAVENOUS

## 2020-04-05 MED ORDER — DEXAMETHASONE SODIUM PHOSPHATE 10 MG/ML IJ SOLN
INTRAMUSCULAR | Status: DC | PRN
  Administered 2020-04-05: 5 mg via INTRAVENOUS

## 2020-04-05 MED ORDER — ONDANSETRON HCL 4 MG/2ML IJ SOLN: 4.0000 mg | Freq: Once | INTRAMUSCULAR | Status: DC | PRN

## 2020-04-05 MED ORDER — SUCCINYLCHOLINE CHLORIDE 200 MG/10ML IV SOSY
PREFILLED_SYRINGE | INTRAVENOUS | Status: DC | PRN
  Administered 2020-04-05: 140 mg via INTRAVENOUS

## 2020-04-05 MED ORDER — OXYCODONE HCL 5 MG PO TABS: 5.0000 mg | ORAL_TABLET | Freq: Once | ORAL | Status: DC | PRN

## 2020-04-05 MED ORDER — ONDANSETRON HCL 4 MG/2ML IJ SOLN
INTRAMUSCULAR | Status: DC | PRN
  Administered 2020-04-05: 4 mg via INTRAVENOUS

## 2020-04-05 NOTE — Transfer of Care (Signed)
Immediate Anesthesia Transfer of Care Note  Patient: Micheal Donovan  Procedure(s) Performed: MRI SPINE WITOUT CONTRAST (N/A )  Patient Location: PACU  Anesthesia Type:General  Level of Consciousness: drowsy, patient cooperative and responds to stimulation  Airway & Oxygen Therapy: Patient Spontanous Breathing  Post-op Assessment: Report given to RN and Post -op Vital signs reviewed and stable  Post vital signs: Reviewed and stable  Last Vitals:  Vitals Value Taken Time  BP 147/82 04/05/20 1026  Temp    Pulse 90 04/05/20 1027  Resp 17 04/05/20 1027  SpO2 95 % 04/05/20 1027  Vitals shown include unvalidated device data.  Last Pain:  Vitals:   04/05/20 0748  TempSrc:   PainSc: 0-No pain      Patients Stated Pain Goal: 2 (09/47/09 6283)  Complications: No complications documented.

## 2020-04-05 NOTE — Anesthesia Procedure Notes (Signed)
Procedure Name: Intubation Date/Time: 04/05/2020 9:39 AM Performed by: Janace Litten, CRNA Pre-anesthesia Checklist: Patient identified, Emergency Drugs available, Suction available and Patient being monitored Patient Re-evaluated:Patient Re-evaluated prior to induction Oxygen Delivery Method: Circle System Utilized Preoxygenation: Pre-oxygenation with 100% oxygen Induction Type: IV induction Laryngoscope Size: Mac and 4 Grade View: Grade I Tube type: Oral Tube size: 7.5 mm Number of attempts: 1 Airway Equipment and Method: Stylet Placement Confirmation: ETT inserted through vocal cords under direct vision,  positive ETCO2 and breath sounds checked- equal and bilateral Tube secured with: Tape Dental Injury: Teeth and Oropharynx as per pre-operative assessment

## 2020-04-05 NOTE — Anesthesia Preprocedure Evaluation (Signed)
Anesthesia Evaluation  Patient identified by MRN, date of birth, ID band Patient awake    Reviewed: Allergy & Precautions, NPO status , Patient's Chart, lab work & pertinent test results  Airway Mallampati: III  TM Distance: >3 FB Neck ROM: Full    Dental  (+) Teeth Intact, Dental Advisory Given, Chipped,    Pulmonary former smoker,    breath sounds clear to auscultation       Cardiovascular hypertension,  Rhythm:Regular Rate:Normal     Neuro/Psych    GI/Hepatic   Endo/Other  diabetes  Renal/GU      Musculoskeletal   Abdominal   Peds  Hematology   Anesthesia Other Findings   Reproductive/Obstetrics                             Anesthesia Physical Anesthesia Plan  ASA: III  Anesthesia Plan: General   Post-op Pain Management:    Induction: Intravenous  PONV Risk Score and Plan: Ondansetron  Airway Management Planned: Oral ETT  Additional Equipment:   Intra-op Plan:   Post-operative Plan: Extubation in OR  Informed Consent: I have reviewed the patients History and Physical, chart, labs and discussed the procedure including the risks, benefits and alternatives for the proposed anesthesia with the patient or authorized representative who has indicated his/her understanding and acceptance.     Dental advisory given  Plan Discussed with: CRNA and Anesthesiologist  Anesthesia Plan Comments:         Anesthesia Quick Evaluation

## 2020-04-05 NOTE — Anesthesia Postprocedure Evaluation (Signed)
Anesthesia Post Note  Patient: Micheal Donovan  Procedure(s) Performed: MRI SPINE WITOUT CONTRAST (N/A )     Patient location during evaluation: PACU Anesthesia Type: General Level of consciousness: awake and alert Pain management: pain level controlled Vital Signs Assessment: post-procedure vital signs reviewed and stable Respiratory status: spontaneous breathing, nonlabored ventilation, respiratory function stable and patient connected to nasal cannula oxygen Cardiovascular status: blood pressure returned to baseline and stable Postop Assessment: no apparent nausea or vomiting Anesthetic complications: no   No complications documented.  Last Vitals:  Vitals:   04/05/20 1040 04/05/20 1045  BP: 138/81 101/71  Pulse: 82 82  Resp: 11 20  Temp:  (!) 36.2 C  SpO2: 98% 96%    Last Pain:  Vitals:   04/05/20 1025  TempSrc:   PainSc: Asleep                 Tisheena Maguire COKER

## 2020-04-06 ENCOUNTER — Encounter (HOSPITAL_COMMUNITY): Payer: Self-pay | Admitting: Radiology

## 2020-04-13 DIAGNOSIS — M5136 Other intervertebral disc degeneration, lumbar region: Secondary | ICD-10-CM | POA: Diagnosis not present

## 2020-04-15 ENCOUNTER — Other Ambulatory Visit: Payer: Self-pay | Admitting: Family Medicine

## 2020-04-22 ENCOUNTER — Other Ambulatory Visit: Payer: Self-pay | Admitting: Family Medicine

## 2020-05-03 DIAGNOSIS — M5416 Radiculopathy, lumbar region: Secondary | ICD-10-CM | POA: Diagnosis not present

## 2020-05-10 ENCOUNTER — Other Ambulatory Visit: Payer: Self-pay | Admitting: Family Medicine

## 2020-06-27 ENCOUNTER — Other Ambulatory Visit: Payer: Self-pay | Admitting: Family Medicine

## 2020-07-26 ENCOUNTER — Telehealth: Payer: Self-pay | Admitting: Family Medicine

## 2020-08-14 ENCOUNTER — Other Ambulatory Visit: Payer: Self-pay | Admitting: Family Medicine

## 2020-09-04 DIAGNOSIS — X32XXXD Exposure to sunlight, subsequent encounter: Secondary | ICD-10-CM | POA: Diagnosis not present

## 2020-09-04 DIAGNOSIS — L570 Actinic keratosis: Secondary | ICD-10-CM | POA: Diagnosis not present

## 2020-09-04 DIAGNOSIS — D044 Carcinoma in situ of skin of scalp and neck: Secondary | ICD-10-CM | POA: Diagnosis not present

## 2020-10-01 ENCOUNTER — Other Ambulatory Visit: Payer: Self-pay | Admitting: Family Medicine

## 2020-10-29 ENCOUNTER — Telehealth: Payer: Self-pay

## 2020-10-29 DIAGNOSIS — Z9189 Other specified personal risk factors, not elsewhere classified: Secondary | ICD-10-CM

## 2020-10-29 NOTE — Progress Notes (Signed)
[name])                                            [place] Team                                        Statin Quality Measure Assessment    10/29/2020  Micheal Donovan 1950-03-18 [id number]  Per review of chart and payor information, this patient has been flagged for non-adherence to the following CMS Quality Measure:   [x]  Statin Use in Persons with Diabetes  []  Statin Use in Persons with Cardiovascular Disease  The ASCVD Risk score [name], et al., 2013) failed to calculate for the following reasons:   Cannot find a previous HDL lab   Cannot find a previous total cholesterol lab No results found for requested labs within last 26280 hours.  Currently prescribed statin:  []  Yes [x]  No     Comments: N/A  History of statin use:            [x]  Yes []  No   Comments: Rosuvastatin. Unable to calculate the ASCVD risk score since there are no recent lipid panels or BP readings. Last OV w/ PCP October 2020 and A1c 8.4% on 05/04/2017. I am unsure who manages pt diabetes. I called and spoke with the patient who stated that his PCP is still Dr. Dennard Donovan; he attributes lost to follow-up d/t COVID-19 pandemic. He declined assistance, from this pharmacist, with scheduling an appointment for this year with PCP. He stated that he may eventually schedule an appointment this year but will delay scheduling due to a fishing trip.    Please consider the following recommendations:  If clinically appropriate, please consider patient outreach to schedule an appointment and statin discussion.    Thank you for your time,  Kristeen Miss, [place] Cell: [phone]

## 2020-11-20 ENCOUNTER — Other Ambulatory Visit: Payer: Self-pay | Admitting: Family Medicine

## 2021-01-08 ENCOUNTER — Encounter: Payer: Self-pay | Admitting: *Deleted

## 2021-01-15 ENCOUNTER — Other Ambulatory Visit: Payer: Self-pay | Admitting: Family Medicine

## 2021-03-15 DIAGNOSIS — M5416 Radiculopathy, lumbar region: Secondary | ICD-10-CM | POA: Insufficient documentation

## 2021-03-18 DIAGNOSIS — M5416 Radiculopathy, lumbar region: Secondary | ICD-10-CM | POA: Diagnosis not present

## 2021-04-11 NOTE — Telephone Encounter (Signed)
err

## 2021-06-01 DIAGNOSIS — I619 Nontraumatic intracerebral hemorrhage, unspecified: Secondary | ICD-10-CM

## 2021-06-01 HISTORY — DX: Nontraumatic intracerebral hemorrhage, unspecified: I61.9

## 2021-06-03 DIAGNOSIS — J019 Acute sinusitis, unspecified: Secondary | ICD-10-CM | POA: Diagnosis not present

## 2021-06-10 ENCOUNTER — Ambulatory Visit (INDEPENDENT_AMBULATORY_CARE_PROVIDER_SITE_OTHER): Payer: PPO | Admitting: Family Medicine

## 2021-06-10 ENCOUNTER — Other Ambulatory Visit: Payer: Self-pay

## 2021-06-10 ENCOUNTER — Emergency Department (HOSPITAL_COMMUNITY): Payer: PPO

## 2021-06-10 ENCOUNTER — Inpatient Hospital Stay (HOSPITAL_COMMUNITY)
Admission: EM | Admit: 2021-06-10 | Discharge: 2021-06-20 | DRG: 987 | Disposition: A | Discharge status: Rehabilitation Facility | Payer: PPO | Attending: Internal Medicine | Admitting: Internal Medicine

## 2021-06-10 ENCOUNTER — Encounter (HOSPITAL_COMMUNITY): Payer: Self-pay | Admitting: *Deleted

## 2021-06-10 VITALS — BP 122/98 | HR 96 | Temp 95.8°F | Ht 67.0 in | Wt 148.2 lb

## 2021-06-10 DIAGNOSIS — J439 Emphysema, unspecified: Secondary | ICD-10-CM | POA: Diagnosis present

## 2021-06-10 DIAGNOSIS — C3412 Malignant neoplasm of upper lobe, left bronchus or lung: Secondary | ICD-10-CM | POA: Diagnosis present

## 2021-06-10 DIAGNOSIS — R42 Dizziness and giddiness: Secondary | ICD-10-CM | POA: Diagnosis not present

## 2021-06-10 DIAGNOSIS — Z79899 Other long term (current) drug therapy: Secondary | ICD-10-CM

## 2021-06-10 DIAGNOSIS — Z91148 Patient's other noncompliance with medication regimen for other reason: Secondary | ICD-10-CM

## 2021-06-10 DIAGNOSIS — Z794 Long term (current) use of insulin: Secondary | ICD-10-CM

## 2021-06-10 DIAGNOSIS — Z87891 Personal history of nicotine dependence: Secondary | ICD-10-CM | POA: Diagnosis not present

## 2021-06-10 DIAGNOSIS — Z7985 Long-term (current) use of injectable non-insulin antidiabetic drugs: Secondary | ICD-10-CM | POA: Diagnosis not present

## 2021-06-10 DIAGNOSIS — Z825 Family history of asthma and other chronic lower respiratory diseases: Secondary | ICD-10-CM

## 2021-06-10 DIAGNOSIS — E785 Hyperlipidemia, unspecified: Secondary | ICD-10-CM | POA: Diagnosis present

## 2021-06-10 DIAGNOSIS — Z20822 Contact with and (suspected) exposure to covid-19: Secondary | ICD-10-CM | POA: Diagnosis present

## 2021-06-10 DIAGNOSIS — G4452 New daily persistent headache (NDPH): Secondary | ICD-10-CM

## 2021-06-10 DIAGNOSIS — I614 Nontraumatic intracerebral hemorrhage in cerebellum: Secondary | ICD-10-CM | POA: Diagnosis present

## 2021-06-10 DIAGNOSIS — I639 Cerebral infarction, unspecified: Secondary | ICD-10-CM | POA: Diagnosis not present

## 2021-06-10 DIAGNOSIS — R918 Other nonspecific abnormal finding of lung field: Secondary | ICD-10-CM | POA: Diagnosis not present

## 2021-06-10 DIAGNOSIS — E860 Dehydration: Secondary | ICD-10-CM | POA: Diagnosis present

## 2021-06-10 DIAGNOSIS — N281 Cyst of kidney, acquired: Secondary | ICD-10-CM | POA: Diagnosis not present

## 2021-06-10 DIAGNOSIS — J189 Pneumonia, unspecified organism: Secondary | ICD-10-CM

## 2021-06-10 DIAGNOSIS — R590 Localized enlarged lymph nodes: Secondary | ICD-10-CM | POA: Diagnosis not present

## 2021-06-10 DIAGNOSIS — R270 Ataxia, unspecified: Secondary | ICD-10-CM | POA: Diagnosis not present

## 2021-06-10 DIAGNOSIS — R519 Headache, unspecified: Secondary | ICD-10-CM | POA: Diagnosis present

## 2021-06-10 DIAGNOSIS — I726 Aneurysm of vertebral artery: Secondary | ICD-10-CM | POA: Diagnosis not present

## 2021-06-10 DIAGNOSIS — I619 Nontraumatic intracerebral hemorrhage, unspecified: Secondary | ICD-10-CM

## 2021-06-10 DIAGNOSIS — R29702 NIHSS score 2: Secondary | ICD-10-CM | POA: Diagnosis present

## 2021-06-10 DIAGNOSIS — D75839 Thrombocytosis, unspecified: Secondary | ICD-10-CM | POA: Diagnosis present

## 2021-06-10 DIAGNOSIS — E1110 Type 2 diabetes mellitus with ketoacidosis without coma: Secondary | ICD-10-CM | POA: Diagnosis present

## 2021-06-10 DIAGNOSIS — R260 Ataxic gait: Secondary | ICD-10-CM | POA: Diagnosis not present

## 2021-06-10 DIAGNOSIS — K922 Gastrointestinal hemorrhage, unspecified: Secondary | ICD-10-CM | POA: Diagnosis not present

## 2021-06-10 DIAGNOSIS — C3490 Malignant neoplasm of unspecified part of unspecified bronchus or lung: Secondary | ICD-10-CM

## 2021-06-10 DIAGNOSIS — I10 Essential (primary) hypertension: Secondary | ICD-10-CM

## 2021-06-10 DIAGNOSIS — L89150 Pressure ulcer of sacral region, unstageable: Secondary | ICD-10-CM | POA: Diagnosis not present

## 2021-06-10 DIAGNOSIS — R739 Hyperglycemia, unspecified: Secondary | ICD-10-CM | POA: Diagnosis present

## 2021-06-10 DIAGNOSIS — D72829 Elevated white blood cell count, unspecified: Secondary | ICD-10-CM | POA: Diagnosis not present

## 2021-06-10 DIAGNOSIS — Z7984 Long term (current) use of oral hypoglycemic drugs: Secondary | ICD-10-CM | POA: Diagnosis not present

## 2021-06-10 DIAGNOSIS — I69198 Other sequelae of nontraumatic intracerebral hemorrhage: Secondary | ICD-10-CM | POA: Diagnosis not present

## 2021-06-10 DIAGNOSIS — Z85820 Personal history of malignant melanoma of skin: Secondary | ICD-10-CM

## 2021-06-10 DIAGNOSIS — I6389 Other cerebral infarction: Secondary | ICD-10-CM | POA: Diagnosis not present

## 2021-06-10 DIAGNOSIS — I2699 Other pulmonary embolism without acute cor pulmonale: Secondary | ICD-10-CM | POA: Diagnosis present

## 2021-06-10 DIAGNOSIS — Z8673 Personal history of transient ischemic attack (TIA), and cerebral infarction without residual deficits: Secondary | ICD-10-CM | POA: Diagnosis not present

## 2021-06-10 DIAGNOSIS — E11319 Type 2 diabetes mellitus with unspecified diabetic retinopathy without macular edema: Secondary | ICD-10-CM | POA: Diagnosis present

## 2021-06-10 DIAGNOSIS — I63442 Cerebral infarction due to embolism of left cerebellar artery: Secondary | ICD-10-CM | POA: Diagnosis present

## 2021-06-10 DIAGNOSIS — Z8709 Personal history of other diseases of the respiratory system: Secondary | ICD-10-CM | POA: Diagnosis not present

## 2021-06-10 DIAGNOSIS — E1165 Type 2 diabetes mellitus with hyperglycemia: Secondary | ICD-10-CM

## 2021-06-10 DIAGNOSIS — E11649 Type 2 diabetes mellitus with hypoglycemia without coma: Secondary | ICD-10-CM | POA: Diagnosis not present

## 2021-06-10 DIAGNOSIS — L89611 Pressure ulcer of right heel, stage 1: Secondary | ICD-10-CM | POA: Diagnosis not present

## 2021-06-10 DIAGNOSIS — I2510 Atherosclerotic heart disease of native coronary artery without angina pectoris: Secondary | ICD-10-CM | POA: Diagnosis not present

## 2021-06-10 DIAGNOSIS — N400 Enlarged prostate without lower urinary tract symptoms: Secondary | ICD-10-CM | POA: Diagnosis present

## 2021-06-10 DIAGNOSIS — R7989 Other specified abnormal findings of blood chemistry: Secondary | ICD-10-CM | POA: Diagnosis not present

## 2021-06-10 DIAGNOSIS — I671 Cerebral aneurysm, nonruptured: Secondary | ICD-10-CM | POA: Diagnosis not present

## 2021-06-10 DIAGNOSIS — Z87828 Personal history of other (healed) physical injury and trauma: Secondary | ICD-10-CM | POA: Diagnosis not present

## 2021-06-10 DIAGNOSIS — D751 Secondary polycythemia: Secondary | ICD-10-CM | POA: Diagnosis present

## 2021-06-10 DIAGNOSIS — I672 Cerebral atherosclerosis: Secondary | ICD-10-CM | POA: Diagnosis not present

## 2021-06-10 DIAGNOSIS — R0602 Shortness of breath: Secondary | ICD-10-CM | POA: Diagnosis not present

## 2021-06-10 DIAGNOSIS — G936 Cerebral edema: Secondary | ICD-10-CM | POA: Diagnosis present

## 2021-06-10 DIAGNOSIS — Z8249 Family history of ischemic heart disease and other diseases of the circulatory system: Secondary | ICD-10-CM

## 2021-06-10 DIAGNOSIS — I63542 Cerebral infarction due to unspecified occlusion or stenosis of left cerebellar artery: Secondary | ICD-10-CM | POA: Diagnosis not present

## 2021-06-10 DIAGNOSIS — I700 Atherosclerosis of aorta: Secondary | ICD-10-CM | POA: Diagnosis not present

## 2021-06-10 DIAGNOSIS — D170 Benign lipomatous neoplasm of skin and subcutaneous tissue of head, face and neck: Secondary | ICD-10-CM | POA: Diagnosis not present

## 2021-06-10 DIAGNOSIS — Z86711 Personal history of pulmonary embolism: Secondary | ICD-10-CM | POA: Diagnosis not present

## 2021-06-10 DIAGNOSIS — Z7982 Long term (current) use of aspirin: Secondary | ICD-10-CM | POA: Diagnosis not present

## 2021-06-10 DIAGNOSIS — I6200 Nontraumatic subdural hemorrhage, unspecified: Secondary | ICD-10-CM | POA: Diagnosis not present

## 2021-06-10 DIAGNOSIS — Z888 Allergy status to other drugs, medicaments and biological substances status: Secondary | ICD-10-CM

## 2021-06-10 DIAGNOSIS — G44209 Tension-type headache, unspecified, not intractable: Secondary | ICD-10-CM | POA: Diagnosis not present

## 2021-06-10 DIAGNOSIS — Z741 Need for assistance with personal care: Secondary | ICD-10-CM | POA: Diagnosis not present

## 2021-06-10 DIAGNOSIS — J320 Chronic maxillary sinusitis: Secondary | ICD-10-CM | POA: Diagnosis not present

## 2021-06-10 DIAGNOSIS — I69193 Ataxia following nontraumatic intracerebral hemorrhage: Secondary | ICD-10-CM | POA: Diagnosis not present

## 2021-06-10 DIAGNOSIS — T380X5A Adverse effect of glucocorticoids and synthetic analogues, initial encounter: Secondary | ICD-10-CM | POA: Diagnosis not present

## 2021-06-10 LAB — BASIC METABOLIC PANEL
Anion gap: 17 — ABNORMAL HIGH (ref 5–15)
BUN: 34 mg/dL — ABNORMAL HIGH (ref 8–23)
CO2: 20 mmol/L — ABNORMAL LOW (ref 22–32)
Calcium: 10 mg/dL (ref 8.9–10.3)
Chloride: 92 mmol/L — ABNORMAL LOW (ref 98–111)
Creatinine, Ser: 1 mg/dL (ref 0.61–1.24)
GFR, Estimated: >60 mL/min (ref >60)
Glucose, Bld: 365 mg/dL — ABNORMAL HIGH (ref 70–99)
Potassium: 5.3 mmol/L — ABNORMAL HIGH (ref 3.5–5.1)
Sodium: 129 mmol/L — ABNORMAL LOW (ref 135–145)

## 2021-06-10 LAB — CBC
HCT: 52.6 % — ABNORMAL HIGH (ref 39.0–52.0)
Hemoglobin: 17.4 g/dL — ABNORMAL HIGH (ref 13.0–17.0)
MCH: 28.6 pg (ref 26.0–34.0)
MCHC: 33.1 g/dL (ref 30.0–36.0)
MCV: 86.4 fL (ref 80.0–100.0)
Platelets: 471 10*3/uL — ABNORMAL HIGH (ref 150–400)
RBC: 6.09 MIL/uL — ABNORMAL HIGH (ref 4.22–5.81)
RDW: 12.9 % (ref 11.5–15.5)
WBC: 12.5 10*3/uL — ABNORMAL HIGH (ref 4.0–10.5)
nRBC: 0 % (ref 0.0–0.2)

## 2021-06-10 LAB — CBG MONITORING, ED: Glucose-Capillary: 352 mg/dL — ABNORMAL HIGH (ref 70–99)

## 2021-06-10 LAB — GLUCOSE 16585: Glucose: 385 mg/dL — ABNORMAL HIGH (ref 65–99)

## 2021-06-10 MED ORDER — SODIUM CHLORIDE 0.9 % IV BOLUS
1000.0000 mL | Freq: Once | INTRAVENOUS | Status: AC
  Administered 2021-06-10: 1000 mL via INTRAVENOUS

## 2021-06-10 MED ORDER — ONETOUCH ULTRA VI STRP: ORAL_STRIP | 2 refills | Status: DC

## 2021-06-10 NOTE — ED Triage Notes (Signed)
Pt states he was seen at his PCP today and told his cbg readings are high and his electrolytes are low; pt has been trying to drink gatorade but keeps vomiting; pt's last cbg 388; pt c/o feeling dizzy ?

## 2021-06-10 NOTE — Progress Notes (Signed)
? ?Subjective:  ? ? Patient ID: Micheal Donovan, male    DOB: 1950/08/14, 71 y.o.   MRN: [id number] ? ?HPI ?Patient went to the urgent care last week and was given steroids and antibiotics for sinus infection.  He reported a severe headache in his occiput coupled with head congestion and rhinorrhea and coughing.  He states that the severe pain in his occiput improved after the antibiotics although the pain is still present.  He rates the pain as 5 on a scale of 1-10.  He reports feeling extremely nauseated.  He reports unsteadiness on his feet.  Cranial nerves II through XII are grossly intact muscle strength 5/5 equal and symmetric in the upper and lower extremities.  He has no pronator drift on examination.  However when he stands up and I perform Romberg testing, the patient is unsteady and I have to help catch him to prevent him from losing his balance.  He denies any ataxia at home.  However he does state feeling unsteady on his feet.  Patient is an insulin-dependent diabetic.  I asked him what his sugars are running.  He states that he is been out of his insulin since January and he has not checked any of his sugars.  He is not taking any of his diabetic medication.  He does report polyuria and polydipsia.  I checked a random blood sugar today and it was 387.  He states that he has not eaten since last night.  Has been drinking water and diet Pepsi.  He feels queasy on his stomach.  I recommended going to the emergency room for stat labs as well as a CT scan of the brain.  I do not feel that his headache is simply due to a sinus infection.  I am confident that the patient is dehydrated and dealing with hyperglycemia.  The headache could be due to dehydration but I cannot rule out an acute process although his neurologic exam aside from his Romberg testing is reassuring.  The patient refused to go to the emergency room.  I discussed this with his wife present that he adamantly refuses to go to the emergency room.   He wants to try to treat this as an outpatient ?Past Medical History:  ?Diagnosis Date  ? Allergy   ? BPH (benign prostatic hypertrophy)   ? Cancer [facility])   ? Melanoma on Neck    2008  ? Carotid artery occlusion   ? Diabetes mellitus   ? ED (erectile dysfunction)   ? Hyperlipidemia   ? Hypertension   ? Retinopathy due to secondary DM ([place])   ? ?Past Surgical History:  ?Procedure Laterality Date  ? MELANOMA EXCISION  2008  ? Left side of neck  ? RADIOLOGY WITH ANESTHESIA N/A 04/05/2020  ? Procedure: MRI SPINE WITOUT CONTRAST;  Surgeon: Radiologist, Medication, MD;  Location: [place];  Service: Radiology;  Laterality: N/A;  ? TONSILLECTOMY    ? ?Current Outpatient Medications on File Prior to Visit  ?Medication Sig Dispense Refill  ? B-D UF III MINI PEN NEEDLES 31G X 5 MM MISC as directed 100 each 4  ? diphenhydramine-acetaminophen (TYLENOL PM) 25-500 MG TABS tablet Take 1 tablet by mouth at bedtime.    ? Dulaglutide (TRULICITY) 1.5 WP/8.0DX SOPN Inject 1.5 mg into the skin once a week. 12 pen 1  ? LANTUS SOLOSTAR 100 UNIT/ML Solostar Pen ADMINISTER 28 UNITS UNDER THE SKIN EVERY DAY 15 mL 0  ? losartan (COZAAR) 50 MG  tablet TAKE 1 TABLET(50 MG) BY MOUTH DAILY (Patient taking differently: Take by mouth daily.) 30 tablet 0  ? OVER THE COUNTER MEDICATION Take 1 tablet by mouth at bedtime. Legatrim PM    ? OVER THE COUNTER MEDICATION Take 1 drop by mouth daily. CBD Oil    ? metFORMIN (GLUCOPHAGE) 500 MG tablet Take 1 tablet (500 mg total) by mouth 2 (two) times daily with a meal. (Patient not taking: Reported on 06/10/2021) 180 tablet 1  ? ?No current facility-administered medications on file prior to visit.  ? ?Allergies  ?Allergen Reactions  ? Niaspan [Niacin]   ?  FLUSHING  ? ?Social History  ? ?Socioeconomic History  ? Marital status: Single  ?  Spouse name: Not on file  ? Number of children: Not on file  ? Years of education: Not on file  ? Highest education level: Not on file  ?Occupational History  ? Not on file   ?Tobacco Use  ? Smoking status: Former  ?  Types: Cigarettes  ?  Quit date: 07/21/1990  ?  Years since quitting: 30.9  ? Smokeless tobacco: Never  ?Vaping Use  ? Vaping Use: Never used  ?Substance and Sexual Activity  ? Alcohol use: No  ? Drug use: No  ? Sexual activity: Not on file  ?Other Topics Concern  ? Not on file  ?Social History Narrative  ? Not on file  ? ?Social Determinants of Health  ? ?Financial Resource Strain: Not on file  ?Food Insecurity: Not on file  ?Transportation Needs: Not on file  ?Physical Activity: Not on file  ?Stress: Not on file  ?Social Connections: Not on file  ?Intimate Partner Violence: Not on file  ? ? ?Past Medical History:  ?Diagnosis Date  ? Allergy   ? BPH (benign prostatic hypertrophy)   ? Cancer [facility])   ? Melanoma on Neck    2008  ? Carotid artery occlusion   ? Diabetes mellitus   ? ED (erectile dysfunction)   ? Hyperlipidemia   ? Hypertension   ? Retinopathy due to secondary DM ([place])   ? ?Past Surgical History:  ?Procedure Laterality Date  ? MELANOMA EXCISION  2008  ? Left side of neck  ? RADIOLOGY WITH ANESTHESIA N/A 04/05/2020  ? Procedure: MRI SPINE WITOUT CONTRAST;  Surgeon: Radiologist, Medication, MD;  Location: [place];  Service: Radiology;  Laterality: N/A;  ? TONSILLECTOMY    ? ?Current Outpatient Medications on File Prior to Visit  ?Medication Sig Dispense Refill  ? B-D UF III MINI PEN NEEDLES 31G X 5 MM MISC as directed 100 each 4  ? diphenhydramine-acetaminophen (TYLENOL PM) 25-500 MG TABS tablet Take 1 tablet by mouth at bedtime.    ? Dulaglutide (TRULICITY) 1.5 [place]/7.0WC SOPN Inject 1.5 mg into the skin once a week. 12 pen 1  ? LANTUS SOLOSTAR 100 UNIT/ML Solostar Pen ADMINISTER 28 UNITS UNDER THE SKIN EVERY DAY 15 mL 0  ? losartan (COZAAR) 50 MG tablet TAKE 1 TABLET(50 MG) BY MOUTH DAILY (Patient taking differently: Take by mouth daily.) 30 tablet 0  ? OVER THE COUNTER MEDICATION Take 1 tablet by mouth at bedtime. Legatrim PM    ? OVER THE COUNTER MEDICATION Take 1  drop by mouth daily. CBD Oil    ? metFORMIN (GLUCOPHAGE) 500 MG tablet Take 1 tablet (500 mg total) by mouth 2 (two) times daily with a meal. (Patient not taking: Reported on 06/10/2021) 180 tablet 1  ? ?No current facility-administered medications on  file prior to visit.  ? ?Allergies  ?Allergen Reactions  ? Niaspan [Niacin]   ?  FLUSHING  ? ?Social History  ? ?Socioeconomic History  ? Marital status: Single  ?  Spouse name: Not on file  ? Number of children: Not on file  ? Years of education: Not on file  ? Highest education level: Not on file  ?Occupational History  ? Not on file  ?Tobacco Use  ? Smoking status: Former  ?  Types: Cigarettes  ?  Quit date: 07/21/1990  ?  Years since quitting: 30.9  ? Smokeless tobacco: Never  ?Vaping Use  ? Vaping Use: Never used  ?Substance and Sexual Activity  ? Alcohol use: No  ? Drug use: No  ? Sexual activity: Not on file  ?Other Topics Concern  ? Not on file  ?Social History Narrative  ? Not on file  ? ?Social Determinants of Health  ? ?Financial Resource Strain: Not on file  ?Food Insecurity: Not on file  ?Transportation Needs: Not on file  ?Physical Activity: Not on file  ?Stress: Not on file  ?Social Connections: Not on file  ?Intimate Partner Violence: Not on file  ? ? ? ? ?Review of Systems  ?All other systems reviewed and are negative. ? ?   ?Objective:  ? Physical Exam ?Vitals reviewed.  ?Constitutional:   ?   General: He is not in acute distress. ?   Appearance: He is not ill-appearing.  ?HENT:  ?   Mouth/Throat:  ?   Mouth: Mucous membranes are dry.  ?Eyes:  ?   Extraocular Movements: Extraocular movements intact.  ?   Conjunctiva/sclera: Conjunctivae normal.  ?   Pupils: Pupils are equal, round, and reactive to light.  ?Cardiovascular:  ?   Rate and Rhythm: Regular rhythm. Tachycardia present.  ?   Heart sounds: Normal heart sounds. No murmur heard. ?  No friction rub. No gallop.  ?Pulmonary:  ?   Effort: Pulmonary effort is normal. No respiratory distress.  ?    Breath sounds: Normal breath sounds. No stridor. No wheezing, rhonchi or rales.  ?Neurological:  ?   General: No focal deficit present.  ?   Mental Status: He is alert and oriented to person, place, and time.  ?

## 2021-06-11 ENCOUNTER — Inpatient Hospital Stay (HOSPITAL_COMMUNITY): Payer: PPO

## 2021-06-11 ENCOUNTER — Ambulatory Visit: Payer: PPO | Admitting: Family Medicine

## 2021-06-11 ENCOUNTER — Other Ambulatory Visit: Payer: PPO

## 2021-06-11 DIAGNOSIS — E1165 Type 2 diabetes mellitus with hyperglycemia: Secondary | ICD-10-CM

## 2021-06-11 DIAGNOSIS — Z888 Allergy status to other drugs, medicaments and biological substances status: Secondary | ICD-10-CM | POA: Diagnosis not present

## 2021-06-11 DIAGNOSIS — I69193 Ataxia following nontraumatic intracerebral hemorrhage: Secondary | ICD-10-CM | POA: Diagnosis not present

## 2021-06-11 DIAGNOSIS — R739 Hyperglycemia, unspecified: Secondary | ICD-10-CM | POA: Diagnosis present

## 2021-06-11 DIAGNOSIS — C3412 Malignant neoplasm of upper lobe, left bronchus or lung: Secondary | ICD-10-CM | POA: Diagnosis present

## 2021-06-11 DIAGNOSIS — G936 Cerebral edema: Secondary | ICD-10-CM | POA: Diagnosis present

## 2021-06-11 DIAGNOSIS — Z8673 Personal history of transient ischemic attack (TIA), and cerebral infarction without residual deficits: Secondary | ICD-10-CM | POA: Diagnosis not present

## 2021-06-11 DIAGNOSIS — I63442 Cerebral infarction due to embolism of left cerebellar artery: Secondary | ICD-10-CM | POA: Diagnosis present

## 2021-06-11 DIAGNOSIS — R7989 Other specified abnormal findings of blood chemistry: Secondary | ICD-10-CM | POA: Diagnosis not present

## 2021-06-11 DIAGNOSIS — Z7984 Long term (current) use of oral hypoglycemic drugs: Secondary | ICD-10-CM | POA: Diagnosis not present

## 2021-06-11 DIAGNOSIS — Z85820 Personal history of malignant melanoma of skin: Secondary | ICD-10-CM | POA: Diagnosis not present

## 2021-06-11 DIAGNOSIS — I2510 Atherosclerotic heart disease of native coronary artery without angina pectoris: Secondary | ICD-10-CM | POA: Diagnosis not present

## 2021-06-11 DIAGNOSIS — Z825 Family history of asthma and other chronic lower respiratory diseases: Secondary | ICD-10-CM | POA: Diagnosis not present

## 2021-06-11 DIAGNOSIS — C3490 Malignant neoplasm of unspecified part of unspecified bronchus or lung: Secondary | ICD-10-CM | POA: Diagnosis not present

## 2021-06-11 DIAGNOSIS — J189 Pneumonia, unspecified organism: Secondary | ICD-10-CM | POA: Diagnosis present

## 2021-06-11 DIAGNOSIS — E860 Dehydration: Secondary | ICD-10-CM | POA: Diagnosis present

## 2021-06-11 DIAGNOSIS — E785 Hyperlipidemia, unspecified: Secondary | ICD-10-CM | POA: Diagnosis present

## 2021-06-11 DIAGNOSIS — I6389 Other cerebral infarction: Secondary | ICD-10-CM | POA: Diagnosis not present

## 2021-06-11 DIAGNOSIS — I10 Essential (primary) hypertension: Secondary | ICD-10-CM | POA: Diagnosis present

## 2021-06-11 DIAGNOSIS — R918 Other nonspecific abnormal finding of lung field: Secondary | ICD-10-CM | POA: Diagnosis not present

## 2021-06-11 DIAGNOSIS — I726 Aneurysm of vertebral artery: Secondary | ICD-10-CM | POA: Diagnosis not present

## 2021-06-11 DIAGNOSIS — D75839 Thrombocytosis, unspecified: Secondary | ICD-10-CM | POA: Diagnosis present

## 2021-06-11 DIAGNOSIS — I671 Cerebral aneurysm, nonruptured: Secondary | ICD-10-CM | POA: Diagnosis not present

## 2021-06-11 DIAGNOSIS — I63542 Cerebral infarction due to unspecified occlusion or stenosis of left cerebellar artery: Secondary | ICD-10-CM | POA: Diagnosis not present

## 2021-06-11 DIAGNOSIS — Z79899 Other long term (current) drug therapy: Secondary | ICD-10-CM | POA: Diagnosis not present

## 2021-06-11 DIAGNOSIS — Z794 Long term (current) use of insulin: Secondary | ICD-10-CM | POA: Diagnosis not present

## 2021-06-11 DIAGNOSIS — E1110 Type 2 diabetes mellitus with ketoacidosis without coma: Secondary | ICD-10-CM | POA: Diagnosis present

## 2021-06-11 DIAGNOSIS — I700 Atherosclerosis of aorta: Secondary | ICD-10-CM | POA: Diagnosis not present

## 2021-06-11 DIAGNOSIS — R590 Localized enlarged lymph nodes: Secondary | ICD-10-CM | POA: Diagnosis not present

## 2021-06-11 DIAGNOSIS — D72829 Elevated white blood cell count, unspecified: Secondary | ICD-10-CM | POA: Diagnosis not present

## 2021-06-11 DIAGNOSIS — N281 Cyst of kidney, acquired: Secondary | ICD-10-CM | POA: Diagnosis not present

## 2021-06-11 DIAGNOSIS — I6200 Nontraumatic subdural hemorrhage, unspecified: Secondary | ICD-10-CM | POA: Diagnosis not present

## 2021-06-11 DIAGNOSIS — Z7982 Long term (current) use of aspirin: Secondary | ICD-10-CM | POA: Diagnosis not present

## 2021-06-11 DIAGNOSIS — Z7985 Long-term (current) use of injectable non-insulin antidiabetic drugs: Secondary | ICD-10-CM | POA: Diagnosis not present

## 2021-06-11 DIAGNOSIS — E11319 Type 2 diabetes mellitus with unspecified diabetic retinopathy without macular edema: Secondary | ICD-10-CM | POA: Diagnosis present

## 2021-06-11 DIAGNOSIS — I619 Nontraumatic intracerebral hemorrhage, unspecified: Secondary | ICD-10-CM | POA: Diagnosis not present

## 2021-06-11 DIAGNOSIS — J320 Chronic maxillary sinusitis: Secondary | ICD-10-CM | POA: Diagnosis not present

## 2021-06-11 DIAGNOSIS — R519 Headache, unspecified: Secondary | ICD-10-CM | POA: Diagnosis not present

## 2021-06-11 DIAGNOSIS — I672 Cerebral atherosclerosis: Secondary | ICD-10-CM | POA: Diagnosis not present

## 2021-06-11 DIAGNOSIS — Z87891 Personal history of nicotine dependence: Secondary | ICD-10-CM | POA: Diagnosis not present

## 2021-06-11 DIAGNOSIS — N400 Enlarged prostate without lower urinary tract symptoms: Secondary | ICD-10-CM | POA: Diagnosis present

## 2021-06-11 DIAGNOSIS — J439 Emphysema, unspecified: Secondary | ICD-10-CM | POA: Diagnosis present

## 2021-06-11 DIAGNOSIS — Z87828 Personal history of other (healed) physical injury and trauma: Secondary | ICD-10-CM | POA: Diagnosis not present

## 2021-06-11 DIAGNOSIS — Z20822 Contact with and (suspected) exposure to covid-19: Secondary | ICD-10-CM | POA: Diagnosis present

## 2021-06-11 DIAGNOSIS — G44209 Tension-type headache, unspecified, not intractable: Secondary | ICD-10-CM | POA: Diagnosis not present

## 2021-06-11 DIAGNOSIS — I614 Nontraumatic intracerebral hemorrhage in cerebellum: Secondary | ICD-10-CM | POA: Diagnosis present

## 2021-06-11 DIAGNOSIS — I639 Cerebral infarction, unspecified: Secondary | ICD-10-CM | POA: Diagnosis not present

## 2021-06-11 DIAGNOSIS — R0602 Shortness of breath: Secondary | ICD-10-CM | POA: Diagnosis not present

## 2021-06-11 DIAGNOSIS — I2699 Other pulmonary embolism without acute cor pulmonale: Secondary | ICD-10-CM | POA: Diagnosis present

## 2021-06-11 DIAGNOSIS — D170 Benign lipomatous neoplasm of skin and subcutaneous tissue of head, face and neck: Secondary | ICD-10-CM | POA: Diagnosis not present

## 2021-06-11 DIAGNOSIS — Z8709 Personal history of other diseases of the respiratory system: Secondary | ICD-10-CM | POA: Diagnosis not present

## 2021-06-11 LAB — COMPREHENSIVE METABOLIC PANEL
ALT: 16 U/L (ref 0–44)
AST: 17 U/L (ref 15–41)
Albumin: 4.2 g/dL (ref 3.5–5.0)
Alkaline Phosphatase: 165 U/L — ABNORMAL HIGH (ref 38–126)
Anion gap: 13 (ref 5–15)
BUN: 32 mg/dL — ABNORMAL HIGH (ref 8–23)
CO2: 22 mmol/L (ref 22–32)
Calcium: 9.6 mg/dL (ref 8.9–10.3)
Chloride: 97 mmol/L — ABNORMAL LOW (ref 98–111)
Creatinine, Ser: 0.88 mg/dL (ref 0.61–1.24)
GFR, Estimated: >60 mL/min (ref >60)
Glucose, Bld: 310 mg/dL — ABNORMAL HIGH (ref 70–99)
Potassium: 4.2 mmol/L (ref 3.5–5.1)
Sodium: 132 mmol/L — ABNORMAL LOW (ref 135–145)
Total Bilirubin: 1 mg/dL (ref 0.3–1.2)
Total Protein: 8.2 g/dL — ABNORMAL HIGH (ref 6.5–8.1)

## 2021-06-11 LAB — COMPLETE METABOLIC PANEL WITH GFR
AG Ratio: 1.3 (calc) (ref 1.0–2.5)
ALT: 12 U/L (ref 9–46)
AST: 10 U/L (ref 10–35)
Albumin: 4.7 g/dL (ref 3.6–5.1)
Alkaline phosphatase (APISO): 184 U/L — ABNORMAL HIGH (ref 35–144)
BUN/Creatinine Ratio: 35 (calc) — ABNORMAL HIGH (ref 6–22)
BUN: 31 mg/dL — ABNORMAL HIGH (ref 7–25)
CO2: 22 mmol/L (ref 20–32)
Calcium: 10.8 mg/dL — ABNORMAL HIGH (ref 8.6–10.3)
Chloride: 90 mmol/L — ABNORMAL LOW (ref 98–110)
Creat: 0.89 mg/dL (ref 0.70–1.28)
Globulin: 3.5 g/dL (calc) (ref 1.9–3.7)
Glucose, Bld: 393 mg/dL — ABNORMAL HIGH (ref 65–99)
Potassium: 5.2 mmol/L (ref 3.5–5.3)
Sodium: 130 mmol/L — ABNORMAL LOW (ref 135–146)
Total Bilirubin: 0.7 mg/dL (ref 0.2–1.2)
Total Protein: 8.2 g/dL — ABNORMAL HIGH (ref 6.1–8.1)
eGFR: 92 mL/min/{1.73_m2} (ref >=60)

## 2021-06-11 LAB — CBC WITH DIFFERENTIAL/PLATELET
Absolute Monocytes: 671 cells/uL (ref 200–950)
Basophils Absolute: 24 cells/uL (ref 0–200)
Basophils Relative: 0.2 %
Eosinophils Absolute: 12 cells/uL — ABNORMAL LOW (ref 15–500)
Eosinophils Relative: 0.1 %
HCT: 55.1 % — ABNORMAL HIGH (ref 38.5–50.0)
Hemoglobin: 18.1 g/dL — ABNORMAL HIGH (ref 13.2–17.1)
Lymphs Abs: 1232 cells/uL (ref 850–3900)
MCH: 28.8 pg (ref 27.0–33.0)
MCHC: 32.8 g/dL (ref 32.0–36.0)
MCV: 87.6 fL (ref 80.0–100.0)
MPV: 10.6 fL (ref 7.5–12.5)
Monocytes Relative: 5.5 %
Neutro Abs: 10260 cells/uL — ABNORMAL HIGH (ref 1500–7800)
Neutrophils Relative %: 84.1 %
Platelets: 463 10*3/uL — ABNORMAL HIGH (ref 140–400)
RBC: 6.29 10*6/uL — ABNORMAL HIGH (ref 4.20–5.80)
RDW: 12.1 % (ref 11.0–15.0)
Total Lymphocyte: 10.1 %
WBC: 12.2 10*3/uL — ABNORMAL HIGH (ref 3.8–10.8)

## 2021-06-11 LAB — RAPID URINE DRUG SCREEN, HOSP PERFORMED
Amphetamines: NOT DETECTED
Barbiturates: NOT DETECTED
Benzodiazepines: NOT DETECTED
Cocaine: NOT DETECTED
Opiates: NOT DETECTED
Tetrahydrocannabinol: NOT DETECTED

## 2021-06-11 LAB — CBG MONITORING, ED
Glucose-Capillary: 332 mg/dL — ABNORMAL HIGH (ref 70–99)
Glucose-Capillary: 236 mg/dL — ABNORMAL HIGH (ref 70–99)
Glucose-Capillary: 214 mg/dL — ABNORMAL HIGH (ref 70–99)
Glucose-Capillary: 186 mg/dL — ABNORMAL HIGH (ref 70–99)
Glucose-Capillary: 166 mg/dL — ABNORMAL HIGH (ref 70–99)
Glucose-Capillary: 171 mg/dL — ABNORMAL HIGH (ref 70–99)
Glucose-Capillary: 138 mg/dL — ABNORMAL HIGH (ref 70–99)
Glucose-Capillary: 141 mg/dL — ABNORMAL HIGH (ref 70–99)
Glucose-Capillary: 225 mg/dL — ABNORMAL HIGH (ref 70–99)
Glucose-Capillary: 270 mg/dL — ABNORMAL HIGH (ref 70–99)

## 2021-06-11 LAB — CBC
HCT: 50.7 % (ref 39.0–52.0)
Hemoglobin: 16.9 g/dL (ref 13.0–17.0)
MCH: 28.9 pg (ref 26.0–34.0)
MCHC: 33.3 g/dL (ref 30.0–36.0)
MCV: 86.8 fL (ref 80.0–100.0)
Platelets: 386 10*3/uL (ref 150–400)
RBC: 5.84 MIL/uL — ABNORMAL HIGH (ref 4.22–5.81)
RDW: 13.1 % (ref 11.5–15.5)
WBC: 11.7 10*3/uL — ABNORMAL HIGH (ref 4.0–10.5)
nRBC: 0 % (ref 0.0–0.2)

## 2021-06-11 LAB — URINALYSIS, ROUTINE W REFLEX MICROSCOPIC
Bilirubin Urine: NEGATIVE
Glucose, UA: >=500 mg/dL — AB
Hgb urine dipstick: NEGATIVE
Ketones, ur: 80 mg/dL — AB
Leukocytes,Ua: NEGATIVE
Nitrite: NEGATIVE
Protein, ur: NEGATIVE mg/dL
Specific Gravity, Urine: 1.029 (ref 1.005–1.030)
pH: 5 (ref 5.0–8.0)

## 2021-06-11 LAB — GLUCOSE, CAPILLARY: Glucose-Capillary: 166 mg/dL — ABNORMAL HIGH (ref 70–99)

## 2021-06-11 LAB — HEMOGLOBIN A1C
Hgb A1c MFr Bld: 13.6 % of total Hgb — ABNORMAL HIGH (ref <5.7)
Mean Plasma Glucose: 344 mg/dL
eAG (mmol/L): 19 mmol/L

## 2021-06-11 LAB — MAGNESIUM: Magnesium: 2.3 mg/dL (ref 1.7–2.4)

## 2021-06-11 LAB — PHOSPHORUS: Phosphorus: 4.2 mg/dL (ref 2.5–4.6)

## 2021-06-11 LAB — APTT: aPTT: 35 seconds (ref 24–36)

## 2021-06-11 LAB — BETA-HYDROXYBUTYRIC ACID: Beta-Hydroxybutyric Acid: 0.38 mmol/L — ABNORMAL HIGH (ref 0.05–0.27)

## 2021-06-11 LAB — HIV ANTIBODY (ROUTINE TESTING W REFLEX): HIV Screen 4th Generation wRfx: NONREACTIVE

## 2021-06-11 LAB — RESP PANEL BY RT-PCR (FLU A&B, COVID) ARPGX2
Influenza A by PCR: NEGATIVE
Influenza B by PCR: NEGATIVE
SARS Coronavirus 2 by RT PCR: NEGATIVE

## 2021-06-11 LAB — PROTIME-INR
INR: 1 (ref 0.8–1.2)
Prothrombin Time: 12.9 seconds (ref 11.4–15.2)

## 2021-06-11 LAB — ETHANOL: Alcohol, Ethyl (B): <10 mg/dL (ref <10)

## 2021-06-11 MED ORDER — LOSARTAN POTASSIUM 50 MG PO TABS
50.0000 mg | ORAL_TABLET | Freq: Every day | ORAL | Status: DC
  Administered 2021-06-11 – 2021-06-20 (×10): 50 mg via ORAL
  Filled 2021-06-11 (×9): qty 1
  Filled 2021-06-11: qty 2

## 2021-06-11 MED ORDER — ONDANSETRON HCL 4 MG/2ML IJ SOLN
4.0000 mg | Freq: Once | INTRAMUSCULAR | Status: AC
Start: 2021-06-11 — End: 2021-06-11
  Administered 2021-06-11: 4 mg via INTRAVENOUS
  Filled 2021-06-11: qty 2

## 2021-06-11 MED ORDER — IBUPROFEN 200 MG PO TABS
400.0000 mg | ORAL_TABLET | Freq: Once | ORAL | Status: AC
  Administered 2021-06-11: 400 mg via ORAL
  Filled 2021-06-11: qty 2

## 2021-06-11 MED ORDER — DEXTROSE IN LACTATED RINGERS 5 % IV SOLN: INTRAVENOUS | Status: DC

## 2021-06-11 MED ORDER — INSULIN ASPART 100 UNIT/ML IJ SOLN
0.0000 [IU] | Freq: Three times a day (TID) | INTRAMUSCULAR | Status: DC
  Administered 2021-06-11: 5 [IU] via SUBCUTANEOUS
  Administered 2021-06-11: 8 [IU] via SUBCUTANEOUS
  Administered 2021-06-11: 2 [IU] via SUBCUTANEOUS
  Administered 2021-06-12 (×3): 3 [IU] via SUBCUTANEOUS
  Administered 2021-06-13: 5 [IU] via SUBCUTANEOUS
  Administered 2021-06-13 (×2): 3 [IU] via SUBCUTANEOUS
  Administered 2021-06-14: 5 [IU] via SUBCUTANEOUS
  Administered 2021-06-14 – 2021-06-15 (×3): 3 [IU] via SUBCUTANEOUS
  Administered 2021-06-15 – 2021-06-16 (×5): 5 [IU] via SUBCUTANEOUS
  Administered 2021-06-17: 3 [IU] via SUBCUTANEOUS
  Administered 2021-06-17: 5 [IU] via SUBCUTANEOUS
  Administered 2021-06-17: 3 [IU] via SUBCUTANEOUS
  Administered 2021-06-18: 8 [IU] via SUBCUTANEOUS
  Administered 2021-06-18: 3 [IU] via SUBCUTANEOUS
  Administered 2021-06-19: 2 [IU] via SUBCUTANEOUS
  Administered 2021-06-19: 5 [IU] via SUBCUTANEOUS
  Administered 2021-06-19 – 2021-06-20 (×2): 3 [IU] via SUBCUTANEOUS
  Filled 2021-06-11 (×3): qty 1

## 2021-06-11 MED ORDER — INSULIN ASPART 100 UNIT/ML IJ SOLN
0.0000 [IU] | Freq: Every day | INTRAMUSCULAR | Status: DC
  Administered 2021-06-13: 2 [IU] via SUBCUTANEOUS
  Administered 2021-06-15: 3 [IU] via SUBCUTANEOUS
  Administered 2021-06-16: 2 [IU] via SUBCUTANEOUS
  Administered 2021-06-17: 3 [IU] via SUBCUTANEOUS

## 2021-06-11 MED ORDER — LACTATED RINGERS IV SOLN: INTRAVENOUS | Status: DC

## 2021-06-11 MED ORDER — SENNOSIDES-DOCUSATE SODIUM 8.6-50 MG PO TABS
1.0000 | ORAL_TABLET | Freq: Two times a day (BID) | ORAL | Status: DC
  Administered 2021-06-11 – 2021-06-18 (×7): 1 via ORAL
  Filled 2021-06-11 (×17): qty 1

## 2021-06-11 MED ORDER — IOHEXOL 350 MG/ML SOLN
75.0000 mL | Freq: Once | INTRAVENOUS | Status: AC | PRN
  Administered 2021-06-12: 75 mL via INTRAVENOUS

## 2021-06-11 MED ORDER — ACETAMINOPHEN 650 MG RE SUPP: 650.0000 mg | RECTAL | Status: DC | PRN

## 2021-06-11 MED ORDER — STROKE: EARLY STAGES OF RECOVERY BOOK
Freq: Once | Status: AC
  Filled 2021-06-11: qty 1

## 2021-06-11 MED ORDER — ACETAMINOPHEN 325 MG PO TABS
650.0000 mg | ORAL_TABLET | Freq: Four times a day (QID) | ORAL | Status: DC | PRN
Start: 2021-06-11 — End: 2021-06-11
  Administered 2021-06-11 (×3): 650 mg via ORAL
  Filled 2021-06-11 (×3): qty 2

## 2021-06-11 MED ORDER — AMLODIPINE BESYLATE 5 MG PO TABS
5.0000 mg | ORAL_TABLET | Freq: Every day | ORAL | Status: DC
  Administered 2021-06-11 – 2021-06-15 (×5): 5 mg via ORAL
  Filled 2021-06-11 (×6): qty 1

## 2021-06-11 MED ORDER — HYDRALAZINE HCL 20 MG/ML IJ SOLN
10.0000 mg | Freq: Once | INTRAMUSCULAR | Status: AC
  Administered 2021-06-11: 10 mg via INTRAVENOUS
  Filled 2021-06-11: qty 1

## 2021-06-11 MED ORDER — IBUPROFEN 400 MG PO TABS
400.0000 mg | ORAL_TABLET | Freq: Once | ORAL | Status: AC
Start: 2021-06-11 — End: 2021-06-11
  Administered 2021-06-11: 400 mg via ORAL
  Filled 2021-06-11: qty 1

## 2021-06-11 MED ORDER — HYDRALAZINE HCL 20 MG/ML IJ SOLN
10.0000 mg | INTRAMUSCULAR | Status: DC | PRN
  Administered 2021-06-14 – 2021-06-19 (×5): 10 mg via INTRAVENOUS
  Filled 2021-06-11 (×6): qty 1

## 2021-06-11 MED ORDER — SODIUM CHLORIDE 0.9 % IV SOLN
12.5000 mg | Freq: Four times a day (QID) | INTRAVENOUS | Status: DC | PRN
  Administered 2021-06-11 – 2021-06-16 (×7): 12.5 mg via INTRAVENOUS
  Filled 2021-06-11 (×6): qty 0.5
  Filled 2021-06-11 (×2): qty 12.5
  Filled 2021-06-11: qty 0.5

## 2021-06-11 MED ORDER — ACETAMINOPHEN 325 MG PO TABS
650.0000 mg | ORAL_TABLET | ORAL | Status: DC | PRN
  Administered 2021-06-13 – 2021-06-19 (×6): 650 mg via ORAL
  Filled 2021-06-11 (×7): qty 2

## 2021-06-11 MED ORDER — LABETALOL HCL 5 MG/ML IV SOLN
10.0000 mg | Freq: Once | INTRAVENOUS | Status: AC
  Administered 2021-06-11: 10 mg via INTRAVENOUS
  Filled 2021-06-11: qty 4

## 2021-06-11 MED ORDER — INSULIN REGULAR(HUMAN) IN NACL 100-0.9 UT/100ML-% IV SOLN
INTRAVENOUS | Status: DC
  Administered 2021-06-11: 14 [IU]/h via INTRAVENOUS
  Filled 2021-06-11: qty 100

## 2021-06-11 MED ORDER — PANTOPRAZOLE SODIUM 40 MG IV SOLR
40.0000 mg | Freq: Every day | INTRAVENOUS | Status: DC
  Administered 2021-06-11 – 2021-06-14 (×4): 40 mg via INTRAVENOUS
  Filled 2021-06-11 (×4): qty 10

## 2021-06-11 MED ORDER — CLEVIDIPINE BUTYRATE 0.5 MG/ML IV EMUL
0.0000 mg/h | INTRAVENOUS | Status: DC
  Filled 2021-06-11: qty 50

## 2021-06-11 MED ORDER — ACETAMINOPHEN 160 MG/5ML PO SOLN: 650.0000 mg | ORAL | Status: DC | PRN

## 2021-06-11 MED ORDER — DEXTROSE 50 % IV SOLN: 0.0000 mL | INTRAVENOUS | Status: DC | PRN

## 2021-06-11 NOTE — Progress Notes (Signed)
Inpatient Diabetes Program Recommendations ? ?AACE/ADA: New Consensus Statement on Inpatient Glycemic Control  ?Target Ranges:  Prepandial:   less than 140 mg/dL ?     Peak postprandial:   less than 180 mg/dL (1-2 hours) ?     Critically ill patients:  140 - 180 mg/dL  ? ? Latest Reference Range & Units 06/11/21 01:27 06/11/21 02:28 06/11/21 03:32 06/11/21 04:35 06/11/21 05:50 06/11/21 06:47 06/11/21 07:58 06/11/21 09:01  ?Glucose-Capillary 70 - 99 mg/dL 332 (H) 236 (H) 214 (H) 186 (H) 166 (H) 171 (H) 138 (H) 141 (H)  ? ? Latest Reference Range & Units 06/10/21 21:15  ?Glucose-Capillary 70 - 99 mg/dL 352 (H)  ? ? Latest Reference Range & Units 06/10/21 12:10  ?Hemoglobin A1C <5.7 % of total Hgb 13.6 (H)  ? ?Review of Glycemic Control ? ?Diabetes history: DM2 ?Outpatient Diabetes medications: Trulicity 1.5 mg Qweek, Lantus 28 units daily, Metformin 500 mg BID (not taking DM medications) ?Current orders for Inpatient glycemic control: Novolog 0-15 units TID with meals, Novolog 0-5 units QHS ? ?Inpatient Diabetes Program Recommendations:   ? ?Insulin: Noted patient was ordered IV insulin which has been transitioned to SQ insulin. Patient did not receive any basal insulin prior to stopping IV insulin. Please consider ordering Semglee 10 units Q24H. ? ?HbgA1C: A1C 13.6% on 06/10/21 indicating an average glucose of 344 mg/dl over the past 2-3 months. ? ?NOTE: Per H&P, presents to the emergency department due to 1 week onset of generalized weakness, dizziness and headache in the posterior part of head.  Patient states that he ran out of his diabetes and blood pressure medications since January. Patient will be admitted at [facility] with Subacute intraparenchymal hemorrhage of brain (still currently in [facility] ED). Patient was started on IV insulin on 06/11/21 at 1:25 am and per chart, IV insulin was stopped at 8:33 am today.  ? ?Thanks, ?Barnie Alderman, RN, MSN, CDE ?Diabetes Coordinator ?Inpatient Diabetes  Program ?[phone] (Team Pager from 8am to 5pm) ? ? ?

## 2021-06-11 NOTE — H&P (Signed)
?History and Physical  ? ? ?Patient: Micheal Donovan VOZ:[id number] DOB: 08-22-1950 ?DOA: 06/10/2021 ?DOS: the patient was seen and examined on 06/11/2021 ?PCP: [name], MD  ?Patient coming from: Home ? ?Chief Complaint:  ?Chief Complaint  ?Patient presents with  ? Hyperglycemia  ? ?HPI: Micheal Donovan is a 71 y.o. male with medical history significant of T2DM, hypertension who presents to the emergency department due to 1 week onset of generalized weakness, dizziness and headache in the posterior part of head.  Patient states that he ran out of his diabetes and blood pressure medications since January, he followed up with his PCP yesterday, blood work was done and it was noted that his blood sugar level was elevated and he also.  Dehydrated clinically, she was encouraged to hydrate himself, he states that it was difficult to keep down Gatorade 0 and endorsed nonbloody vomiting episodes.  Dizziness worsens when he stands up from a sitting or lying position.  He denies fever, chills, chest pain, shortness of breath, diarrhea, constipation ? ?ED Course:  ?In the emergency department, he was hemodynamically stable.  BP was 165/76 and all other vital signs were within normal range.  Work-up in the ED showed leukocytosis, elevated H/H at 13.1/51.1 and thrombocytosis.  BMP showed hyponatremia and hyperglycemia, BUN 32, creatinine 0.88, ALP 165, magnesium and phosphorus are within normal levels..  Influenza A, B, SARS coronavirus 2 was negative. ?CT head without contrast showed acute to subacute intraparenchymal hemorrhage involving the left cerebellum measuring approximately 3.3 x 2.7 x 4.3 cm (estimated volume 19 mL). Surrounding low-density vasogenic edema with mild regional mass effect. Partial effacement of the fourth ventricle which remains patent at this time. No hydrocephalus or transtentorial herniation. No other acute intracranial abnormality. ?Neurologist at [facility] (Dr. Leonel Ramsay) was consulted by ED physician  and recommended keeping BP less than 116 and then repeat CT for stability, it was recommended for patient to be admitted to [facility] and neurology will follow-up on arrival of the patient.  Patient was treated with labetalol and IV hydration was provided. ? ?Review of Systems: ?Review of systems as noted in the HPI. All other systems reviewed and are negative. ? ? ?Past Medical History:  ?Diagnosis Date  ? Allergy   ? BPH (benign prostatic hypertrophy)   ? Cancer [facility])   ? Melanoma on Neck    2008  ? Carotid artery occlusion   ? Diabetes mellitus   ? ED (erectile dysfunction)   ? Hyperlipidemia   ? Hypertension   ? Retinopathy due to secondary DM ([place])   ? ?Past Surgical History:  ?Procedure Laterality Date  ? MELANOMA EXCISION  2008  ? Left side of neck  ? RADIOLOGY WITH ANESTHESIA N/A 04/05/2020  ? Procedure: MRI SPINE WITOUT CONTRAST;  Surgeon: Radiologist, Medication, MD;  Location: [place];  Service: Radiology;  Laterality: N/A;  ? TONSILLECTOMY    ? ? ?Social History:  reports that he quit smoking about 30 years ago. He has never used smokeless tobacco. He reports that he does not drink alcohol and does not use drugs. ? ? ?Allergies  ?Allergen Reactions  ? Niaspan [Niacin]   ?  FLUSHING  ? ? ?Family History  ?Problem Relation Age of Onset  ? COPD Mother   ? Heart disease Father   ? Heart disease Brother   ?     MI at age 79  ?  ? ? ?Prior to Admission medications   ?Medication Sig Start Date  End Date Taking? Authorizing Provider  ?B-D UF III MINI PEN NEEDLES 31G X 5 MM MISC as directed 03/31/16   [name], MD  ?diphenhydramine-acetaminophen (TYLENOL PM) 25-500 MG TABS tablet Take 1 tablet by mouth at bedtime.    [provider]  ?Dulaglutide (TRULICITY) 1.5 QJ/3.3LK SOPN Inject 1.5 mg into the skin once a week. 05/06/17   [name], MD  ?glucose blood [facility] ULTRA) test strip USE TWICE DAILY 06/10/21   [name], MD  ?LANTUS SOLOSTAR 100 UNIT/ML Solostar Pen ADMINISTER 28 UNITS  UNDER THE SKIN EVERY DAY 01/15/21   [name], MD  ?losartan (COZAAR) 50 MG tablet TAKE 1 TABLET(50 MG) BY MOUTH DAILY ?Patient taking differently: Take by mouth daily. 08/09/18   [name], MD  ?metFORMIN (GLUCOPHAGE) 500 MG tablet Take 1 tablet (500 mg total) by mouth 2 (two) times daily with a meal. ?Patient not taking: Reported on 06/10/2021 05/04/17   [name], MD  ?OVER THE COUNTER MEDICATION Take 1 tablet by mouth at bedtime. Legatrim PM    [provider]  ?OVER THE COUNTER MEDICATION Take 1 drop by mouth daily. CBD Oil    [provider]  ? ? ?Physical Exam: ?BP 140/71   Pulse 69   Temp (!) 97.3 ?F (36.3 ?C) (Oral)   Resp 18   Ht 5\' 7"  (1.702 m)   Wt 67.2 kg   SpO2 100%   BMI 23.20 kg/m?  ? ?General: 71 y.o. year-old male well developed well nourished in no acute distress.  Alert and oriented x3. ?HEENT: NCAT, EOMI, dry mucous membrane. ?Neck: Supple, trachea medial ?Cardiovascular: Regular rate and rhythm with no rubs or gallops.  No thyromegaly or JVD noted.  No lower extremity edema. 2/4 pulses in all 4 extremities. ?Respiratory: Clear to auscultation with no wheezes or rales. Good inspiratory effort. ?Abdomen: Soft, nontender nondistended with normal bowel sounds x4 quadrants. ?Muskuloskeletal: No cyanosis, clubbing or edema noted bilaterally ?Neuro: CN II-XII intact, strength 5/5 x 4, sensation, reflexes intact ?Skin: No ulcerative lesions noted or rashes ?Psychiatry: Mood is appropriate for condition and setting ?   ?   ?   ?Labs on Admission:  ?Basic Metabolic Panel: ?Recent Labs  ?Lab 06/10/21 ?1210 06/10/21 ?2141 06/11/21 ?0118  ?NA 130* 129* 132*  ?K 5.2 5.3* 4.2  ?CL 90* 92* 97*  ?CO2 22 20* 22  ?GLUCOSE 393* 365* 310*  ?BUN 31* 34* 32*  ?CREATININE 0.89 1.00 0.88  ?CALCIUM 10.8* 10.0 9.6  ?MG  --   --  2.3  ?PHOS  --   --  4.2  ? ?Liver Function Tests: ?Recent Labs  ?Lab 06/10/21 ?1210 06/11/21 ?0118  ?AST 10 17  ?ALT 12 16  ?ALKPHOS  --  165*   ?BILITOT 0.7 1.0  ?PROT 8.2* 8.2*  ?ALBUMIN  --  4.2  ? ?No results for input(s): LIPASE, AMYLASE in the last 168 hours. ?No results for input(s): AMMONIA in the last 168 hours. ?CBC: ?Recent Labs  ?Lab 06/10/21 ?1210 06/10/21 ?2141 06/11/21 ?0118  ?WBC 12.2* 12.5* 11.7*  ?NEUTROABS 10,260*  --   --   ?HGB 18.1* 17.4* 16.9  ?HCT 55.1* 52.6* 50.7  ?MCV 87.6 86.4 86.8  ?PLT 463* 471* 386  ? ?Cardiac Enzymes: ?No results for input(s): CKTOTAL, CKMB, CKMBINDEX, TROPONINI in the last 168 hours. ? ?BNP (last 3 results) ?No results for input(s): BNP in the last 8760 hours. ? ?ProBNP (last 3 results) ?No results  for input(s): PROBNP in the last 8760 hours. ? ?CBG: ?Recent Labs  ?Lab 06/10/21 ?2115 06/11/21 ?0127 06/11/21 ?0228 06/11/21 ?0051 06/11/21 ?1021  ?GLUCAP 352* 332* 236* 214* 186*  ? ? ?Radiological Exams on Admission: ?CT Head Wo Contrast ? ?Result Date: 06/11/2021 ?CLINICAL DATA:  Initial evaluation for acute dizziness. EXAM: CT HEAD WITHOUT CONTRAST TECHNIQUE: Contiguous axial images were obtained from the base of the skull through the vertex without intravenous contrast. RADIATION DOSE REDUCTION: This exam was performed according to the departmental dose-optimization program which includes automated exposure control, adjustment of the mA and/or kV according to patient size and/or use of iterative reconstruction technique. COMPARISON:  None. FINDINGS: Brain: Cerebral volume within normal limits. Acute intraparenchymal hemorrhage involving the left cerebellum measuring approximately 3.3 x 2.7 x 4.3 cm (estimated volume 19 mL). Surrounding low-density vasogenic edema with mild regional mass effect. Partial effacement of the fourth ventricle which remains patent at this time. No hydrocephalus. No transtentorial herniation or midline shift. No other acute intracranial hemorrhage or large vessel territory infarct. No visible mass lesion. No extra-axial fluid collection. Vascular: No hyperdense vessel. Skull: Scalp  soft tissues demonstrate no acute finding. Calvarium intact. Sinuses/Orbits: Globes and orbital soft tissues demonstrate no acute finding. Paranasal sinuses and mastoid air cells are clear. Other: None. IMPRESSION: 1

## 2021-06-11 NOTE — ED Provider Notes (Signed)
?[facility] ?Provider Note ? ? ?CSN: [id number] ?Arrival date & time: 06/10/21  2040 ? ?  ? ?History ? ?Chief Complaint  ?Patient presents with  ? Hyperglycemia  ? ? ?Micheal Donovan is a 71 y.o. male. ? ?HPI ? ?  ? ?This is a 71 year old male with a history of diabetes who presents with generally not feeling well, headache, dizziness.  Patient states that he has had a 1 week history of posterior headache, generalized dizziness, and weakness.  He has been out of his diabetes medications.  He saw his primary doctor earlier today and was told that his blood sugar was high at that he was dehydrated.  He was encouraged to hydrate.  He went home and stated that he could not keep down Gatorade 0.  He states that he kept vomiting.  He states he has significant dizziness with position changes.  Does not specifically describe the dizziness as room spinning but it does provoke nausea.  He states that he began to have a posterior headache approximately 1 week ago.  It has been persistent.  He is not on any blood thinners.  He does take a baby aspirin daily. ? ?Home Medications ?Prior to Admission medications   ?Medication Sig Start Date End Date Taking? Authorizing Provider  ?B-D UF III MINI PEN NEEDLES 31G X 5 MM MISC as directed 03/31/16   [name], MD  ?diphenhydramine-acetaminophen (TYLENOL PM) 25-500 MG TABS tablet Take 1 tablet by mouth at bedtime.    [provider]  ?Dulaglutide (TRULICITY) 1.5 KK/9.3GH SOPN Inject 1.5 mg into the skin once a week. 05/06/17   [name], MD  ?glucose blood [facility] ULTRA) test strip USE TWICE DAILY 06/10/21   [name], MD  ?LANTUS SOLOSTAR 100 UNIT/ML Solostar Pen ADMINISTER 28 UNITS UNDER THE SKIN EVERY DAY 01/15/21   [name], MD  ?losartan (COZAAR) 50 MG tablet TAKE 1 TABLET(50 MG) BY MOUTH DAILY ?Patient taking differently: Take by mouth daily. 08/09/18   [name], MD  ?metFORMIN (GLUCOPHAGE) 500 MG tablet Take 1  tablet (500 mg total) by mouth 2 (two) times daily with a meal. ?Patient not taking: Reported on 06/10/2021 05/04/17   [name], MD  ?OVER THE COUNTER MEDICATION Take 1 tablet by mouth at bedtime. Legatrim PM    [provider]  ?OVER THE COUNTER MEDICATION Take 1 drop by mouth daily. CBD Oil    [provider]  ?   ? ?Allergies    ?Niaspan [niacin]   ? ?Review of Systems   ?Review of Systems  ?Constitutional:  Negative for fever.  ?Respiratory:  Negative for shortness of breath.   ?Cardiovascular:  Negative for chest pain.  ?Gastrointestinal:  Positive for nausea and vomiting. Negative for abdominal pain.  ?Neurological:  Positive for dizziness and headaches. Negative for syncope, speech difficulty and weakness.  ?All other systems reviewed and are negative. ? ?Physical Exam ?Updated Vital Signs ?BP (!) 162/80   Pulse 70   Temp (!) 97.3 ?F (36.3 ?C) (Oral)   Resp 20   Ht 1.702 m (5\' 7" )   Wt 67.2 kg   SpO2 100%   BMI 23.20 kg/m?  ?Physical Exam ?Vitals and nursing note reviewed.  ?Constitutional:   ?   Appearance: He is well-developed. He is not ill-appearing.  ?HENT:  ?   Head: Normocephalic and atraumatic.  ?   Nose: Nose normal.  ?   Mouth/Throat:  ?  Mouth: Mucous membranes are dry.  ?Eyes:  ?   Pupils: Pupils are equal, round, and reactive to light.  ?Cardiovascular:  ?   Rate and Rhythm: Normal rate and regular rhythm.  ?   Heart sounds: Normal heart sounds. No murmur heard. ?Pulmonary:  ?   Effort: Pulmonary effort is normal. No respiratory distress.  ?Abdominal:  ?   Palpations: Abdomen is soft.  ?   Tenderness: There is no abdominal tenderness. There is no rebound.  ?Musculoskeletal:  ?   Cervical back: Neck supple.  ?Lymphadenopathy:  ?   Cervical: No cervical adenopathy.  ?Skin: ?   General: Skin is warm and dry.  ?Neurological:  ?   Mental Status: He is alert and oriented to person, place, and time.  ?   Comments: Cranial nerves II through XII intact, 5 out of 5 strength  in all 4 extremities, slight dysmetria noted with finger-nose-finger, gait testing deferred  ?Psychiatric:     ?   Mood and Affect: Mood normal.  ? ? ?ED Results / Procedures / Treatments   ?Labs ?(all labs ordered are listed, but only abnormal results are displayed) ?Labs Reviewed  ?BASIC METABOLIC PANEL - Abnormal; Notable for the following components:  ?    Result Value  ? Sodium 129 (*)   ? Potassium 5.3 (*)   ? Chloride 92 (*)   ? CO2 20 (*)   ? Glucose, Bld 365 (*)   ? BUN 34 (*)   ? Anion gap 17 (*)   ? All other components within normal limits  ?CBC - Abnormal; Notable for the following components:  ? WBC 12.5 (*)   ? RBC 6.09 (*)   ? Hemoglobin 17.4 (*)   ? HCT 52.6 (*)   ? Platelets 471 (*)   ? All other components within normal limits  ?CBG MONITORING, ED - Abnormal; Notable for the following components:  ? Glucose-Capillary 352 (*)   ? All other components within normal limits  ?RESP PANEL BY RT-PCR (FLU A&B, COVID) ARPGX2  ?URINALYSIS, ROUTINE W REFLEX MICROSCOPIC  ?ETHANOL  ?PROTIME-INR  ?APTT  ?RAPID URINE DRUG SCREEN, HOSP PERFORMED  ?CBG MONITORING, ED  ?CBG MONITORING, ED  ?CBG MONITORING, ED  ? ? ?EKG ?None ? ?Radiology ?CT Head Wo Contrast ? ?Result Date: 06/11/2021 ?CLINICAL DATA:  Initial evaluation for acute dizziness. EXAM: CT HEAD WITHOUT CONTRAST TECHNIQUE: Contiguous axial images were obtained from the base of the skull through the vertex without intravenous contrast. RADIATION DOSE REDUCTION: This exam was performed according to the departmental dose-optimization program which includes automated exposure control, adjustment of the mA and/or kV according to patient size and/or use of iterative reconstruction technique. COMPARISON:  None. FINDINGS: Brain: Cerebral volume within normal limits. Acute intraparenchymal hemorrhage involving the left cerebellum measuring approximately 3.3 x 2.7 x 4.3 cm (estimated volume 19 mL). Surrounding low-density vasogenic edema with mild regional mass  effect. Partial effacement of the fourth ventricle which remains patent at this time. No hydrocephalus. No transtentorial herniation or midline shift. No other acute intracranial hemorrhage or large vessel territory infarct. No visible mass lesion. No extra-axial fluid collection. Vascular: No hyperdense vessel. Skull: Scalp soft tissues demonstrate no acute finding. Calvarium intact. Sinuses/Orbits: Globes and orbital soft tissues demonstrate no acute finding. Paranasal sinuses and mastoid air cells are clear. Other: None. IMPRESSION: 1. Acute to subacute intraparenchymal hemorrhage involving the left cerebellum measuring approximately 3.3 x 2.7 x 4.3 cm (estimated volume 19 mL). Surrounding low-density vasogenic edema  with mild regional mass effect. Partial effacement of the fourth ventricle which remains patent at this time. No hydrocephalus or transtentorial herniation. 2. No other acute intracranial abnormality. Critical Value/emergent results were called by telephone at the time of interpretation on 06/11/2021 at 12:49 am to provider [facility] , who verbally acknowledged these results. Electronically Signed   By: Jeannine Boga M.D.   On: 06/11/2021 00:53   ? ?Procedures ?Marland KitchenCritical Care ?Performed by: Merryl Hacker, MD ?Authorized by: Merryl Hacker, MD  ? ?Critical care provider statement:  ?  Critical care time (minutes):  60 ?  Critical care was necessary to treat or prevent imminent or life-threatening deterioration of the following conditions:  CNS failure or compromise (Head bleed, hyperglycemia) ?  Critical care was time spent personally by me on the following activities:  Development of treatment plan with patient or surrogate, discussions with consultants, evaluation of patient's response to treatment, examination of patient, ordering and review of laboratory studies, ordering and review of radiographic studies, ordering and performing treatments and interventions, pulse oximetry,  re-evaluation of patient's condition and review of old charts  ? ? ?Medications Ordered in ED ?Medications  ?insulin regular, human (MYXREDLIN) 100 units/ 100 mL infusion (has no administration in time range)  ?lacta

## 2021-06-11 NOTE — ED Notes (Signed)
Hospitalist Adefeso made aware that patient blood glucose is within target range. No new orders given at this time. ?

## 2021-06-11 NOTE — Progress Notes (Addendum)
?PROGRESS NOTE ? ? ? Micheal Donovan  LPF:[id number] DOB: 09/28/1950 DOA: 06/10/2021 ?PCP: [name], MD ? ? ?Brief Narrative:  ?Micheal Donovan is a 71 y.o. male with medical history significant of T2DM, hypertension who presents to the emergency department due to 1 week onset of generalized weakness, dizziness and headache in the posterior part of head.   ? ?Patient indicates he has been out of home blood pressure and diabetes medications for months, presented to PCP with notable hyperglycemia, hypertension and profound dehydration and subsequently sent to the hospital for further evaluation and treatment. ? ?Unfortunately in the ED patient's hyperglycemia was profound requiring insulin drip overnight, patient's headache was followed up with CT head noncontrast which was remarkable for acute to subacute intraparenchymal hemorrhage of the left cerebellum 3.3 x 2.7 x 4.3 cm around 19 mL.  Given these acute findings patient's blood pressure was aggressively controlled, patient is currently awaiting transfer to [place] for closer neurology and neurosurgical follow-up. ? ?Assessment & Plan: ?  ?Principal Problem: ?  Intraparenchymal hemorrhage of brain ([place]) ?Active Problems: ?  Essential hypertension ?  Type 2 diabetes mellitus with hyperglycemia (HCC) ?  Pseudohyponatremia ?  Dehydration ?  Leukocytosis ?  Thrombocytosis ?  Hypercalcemia ? ?Subacute intraparenchymal hemorrhage of brain ?Intractable posterior headache ?Uncontrolled hypertension ?Continue aggressive blood pressure control as per neurology ?Continue home losartan, additional amlodipine with as needed hydralazine goal SBP less than 120 ?Transfer to [place] pending ?CT head as above shows subacute to acute intraparenchymal cerebellar hemorrhage consistent with location of headache ?Repeat imaging per neurology ? ?Anion gap metabolic acidosis in the setting of hyperglycemia, uncontrolled insulin-dependent diabetes ?Rule out  HNKS versus DKA, resolved ?Transition off insulin drip, initiate sliding scale insulin, advance diet as tolerated off IV fluids/D5 ?A1c markedly elevated at 13.6, will transition to likely long-acting insulin at discharge +/- Premeal versus sliding scale insulin as indicated ? ?Medication noncompliance, profound  ?Lengthy discussion at bedside today about need for medication compliance given patient's hypertension, concurrent intraparenchymal hemorrhage and DKA ? ?Pseudohyponatremia -improving with correction of hyperglycemia ? ?Hemoconcentration with notable leukocytosis thrombocytosis and polycythemia ?Profound dehydration secondary to above  ?Resolving with IV fluids, follow repeat labs  ? ?Hypercalcemia-resolved ?Elevated alk phos, likely reactive secondary to above continue to monitor liver enzymes ?Essential hypertension -losartan 50 is only home medication -adjusting as above ?  ?DVT prophylaxis: SCDs given above ?Code Status: Full ?Family Communication: None present ? ?Status is: Inpatient ? ?Dispo: The patient is from: Home ?             Anticipated d/c is to: To be determined ?             Anticipated d/c date is: 48 to 72 hours ?             Patient currently not medically stable for discharge ? ?Consultants:  ?Neurology ? ?Procedures:  ?None ? ?Antimicrobials:  ?None ? ?Subjective: ?Continues to complain of headache, improving but not yet resolved otherwise denies nausea vomiting diarrhea constipation fevers chills or chest pain. ? ?Objective: ?Vitals:  ? 06/11/21 0610 06/11/21 0620 06/11/21 0630 06/11/21 0710  ?BP: (!) 151/71 (!) 143/73 (!) 150/72 137/71  ?Pulse: 66 68 71 65  ?Resp: _0 ?Temp:      ?TempSrc:      ?SpO2: 100% 99% 98% 94%  ?Weight:      ?Height:      ? ? ?Intake/Output  Summary (Last 24 hours) at 06/11/2021 0808 ?Last data filed at 06/11/2021 0800 ?Gross per 24 hour  ?Intake 1044.24 ml  ?Output --  ?Net 1044.24 ml  ? ?Filed Weights  ? 06/10/21 2115  ?Weight: 67.2 kg   ? ? ?Examination: ? ?General exam: Appears calm and comfortable  ?Respiratory system: Clear to auscultation. Respiratory effort normal. ?Cardiovascular system: S1 & S2 heard, RRR. No JVD, murmurs, rubs, gallops or clicks. No pedal edema. ?Gastrointestinal system: Abdomen is nondistended, soft and nontender. No organomegaly or masses felt. Normal bowel sounds heard. ?Central nervous system: Alert and oriented. No focal neurological deficits. ?Extremities: Symmetric 5 x 5 power. ?Skin: No rashes, lesions or ulcers ?Psychiatry: Judgement and insight appear normal. Mood & affect appropriate.  ? ? ? ?Data Reviewed: I have personally reviewed following labs and imaging studies ? ?CBC: ?Recent Labs  ?Lab 06/10/21 ?1210 06/10/21 ?2141 06/11/21 ?0118  ?WBC 12.2* 12.5* 11.7*  ?NEUTROABS 10,260*  --   --   ?HGB 18.1* 17.4* 16.9  ?HCT 55.1* 52.6* 50.7  ?MCV 87.6 86.4 86.8  ?PLT 463* 471* 386  ? ?Basic Metabolic Panel: ?Recent Labs  ?Lab 06/10/21 ?1210 06/10/21 ?2141 06/11/21 ?0118  ?NA 130* 129* 132*  ?K 5.2 5.3* 4.2  ?CL 90* 92* 97*  ?CO2 22 20* 22  ?GLUCOSE 393* 365* 310*  ?BUN 31* 34* 32*  ?CREATININE 0.89 1.00 0.88  ?CALCIUM 10.8* 10.0 9.6  ?MG  --   --  2.3  ?PHOS  --   --  4.2  ? ?GFR: ?Estimated Creatinine Clearance: 73 mL/min (by C-G formula based on SCr of 0.88 mg/dL). ?Liver Function Tests: ?Recent Labs  ?Lab 06/10/21 ?1210 06/11/21 ?0118  ?AST 10 17  ?ALT 12 16  ?ALKPHOS  --  165*  ?BILITOT 0.7 1.0  ?PROT 8.2* 8.2*  ?ALBUMIN  --  4.2  ? ?No results for input(s): LIPASE, AMYLASE in the last 168 hours. ?No results for input(s): AMMONIA in the last 168 hours. ?Coagulation Profile: ?Recent Labs  ?Lab 06/11/21 ?0118  ?INR 1.0  ? ?Cardiac Enzymes: ?No results for input(s): CKTOTAL, CKMB, CKMBINDEX, TROPONINI in the last 168 hours. ?BNP (last 3 results) ?No results for input(s): PROBNP in the last 8760 hours. ?HbA1C: ?Recent Labs  ?  06/10/21 ?1210  ?HGBA1C 13.6*  ? ?CBG: ?Recent Labs  ?Lab 06/11/21 ?0332 06/11/21 ?1884  06/11/21 ?1660 06/11/21 ?6301 06/11/21 ?0758  ?GLUCAP 214* 186* 166* 171* 138*  ? ?Lipid Profile: ?No results for input(s): CHOL, HDL, LDLCALC, TRIG, CHOLHDL, LDLDIRECT in the last 72 hours. ?Thyroid Function Tests: ?No results for input(s): TSH, T4TOTAL, FREET4, T3FREE, THYROIDAB in the last 72 hours. ?Anemia Panel: ?No results for input(s): VITAMINB12, FOLATE, FERRITIN, TIBC, IRON, RETICCTPCT in the last 72 hours. ?Sepsis Labs: ?No results for input(s): PROCALCITON, LATICACIDVEN in the last 168 hours. ? ?Recent Results (from the past 240 hour(s))  ?Resp Panel by RT-PCR (Flu A&B, Covid) Urine, Clean Catch     Status: None  ? Collection Time: 06/11/21  2:39 AM  ? Specimen: Urine, Clean Catch; Nasopharyngeal(NP) swabs in vial transport medium  ?Result Value Ref Range Status  ? SARS Coronavirus 2 by RT PCR NEGATIVE NEGATIVE Final  ?  Comment: (NOTE) ?SARS-CoV-2 target nucleic acids are NOT DETECTED. ? ?The SARS-CoV-2 RNA is generally detectable in upper respiratory ?specimens during the acute phase of infection. The lowest ?concentration of SARS-CoV-2 viral copies this assay can detect is ?138 copies/mL. A negative result does not preclude SARS-Cov-2 ?infection and should not  be used as the sole basis for treatment or ?other patient management decisions. A negative result may occur with  ?improper specimen collection/handling, submission of specimen other ?than nasopharyngeal swab, presence of viral mutation(s) within the ?areas targeted by this assay, and inadequate number of viral ?copies(<138 copies/mL). A negative result must be combined with ?clinical observations, patient history, and epidemiological ?information. The expected result is Negative. ? ?Fact Sheet for Patients:  ?EntrepreneurPulse.com.au ? ?Fact Sheet for Healthcare Providers:  ?IncredibleEmployment.be ? ?This test is no t yet approved or cleared by the [place] FDA and  ?has been authorized for detection  and/or diagnosis of SARS-CoV-2 by ?FDA under an Emergency Use Authorization (EUA). This EUA will remain  ?in effect (meaning this test can be used) for the duration of the ?COVID-19 declaration under Section 564(b)(1) of th

## 2021-06-11 NOTE — Progress Notes (Addendum)
This RN and other staff members were at nursing desk when a loud crashing noise came from room 19. Maci Brame, RN went to assess situation, where she found pt laying on floor. Pt stated that he fell trying to use the urinal. Pt assisted back into bed and assessed by this RN. Pt stated he fell on right arm and denies hitting head. Pt with skin tears to right forearm and elbow. Skin tears cleaned and dressed. Pt has no other complaints at this time. Charge nurse contacted admitting doc Adefeso. No new orders at this time. Post fall interventions being implemented.  ? ?Pt A&O x4 and educated on ways to prevent future falls. Pt stated that he is now aware of his limitations  and will push call light when needing assistance. Pt understands instructions. ?

## 2021-06-11 NOTE — Progress Notes (Signed)
Overnight progress note ? ?Patient admitted for intractable headache secondary to acute to subacute intraparenchymal brain hemorrhage.  ? ?Notified by RN that patient continues to complain of a headache and on exam has mild left-sided pronator drift and ataxia. ? ?-Stat repeat CT head ordered ?-Neurology consulted, appreciate help ?-Continue aggressive blood pressure control ? ?

## 2021-06-11 NOTE — Consult Note (Addendum)
Neurology Consultation ? ?Reason for Consult: Intracerebral hemorrhage ?Referring Physician: Dr. Josephine Cables, [facility] ? ?CC: Headache, deranged sugar levels ? ?History is obtained from: Patient, ? ?HPI: Micheal Donovan is a 71 y.o. male past medical history of uncontrolled diabetes, hypertension, hyperlipidemia, retinopathy from diabetes, based on carotid ultrasound 2014-bilateral ICA stenosis 60 to 79%, presented to the emergency room at [facility] for evaluation of a headache that had been going on for 1 week. ?He says that his blood sugars were running high in the 300s.  He started having a headache about a week ago which did not improve.  Also reports being dizzy and walking like a drunk which has not improved. ?He went to his primary doctor.  Initial thought was that this was some sort of sinus headache.  He was given steroids and antibiotics.  Headache did not improve and his primary care provider sent him for further evaluation.  Head CT was done in the emergency room-that revealed left cerebellar IPH-see details below. ?Reports worsening of his dizziness and incoordination when he stands up and tries to walk.  Reports of a constant headache in the back of his head with no aggravating or relieving factors.  Reports no prior history of strokes.  Reports no history of heart attacks in the past. ? ?Initial pressures were in the 120s on arrival in the ER. ? ?ROS positive for 30 lbs wt loss in 3 months. ? ?LKW: 1 week ago  ?tpa given?: no, has ICH ?Premorbid modified Rankin scale (mRS): 0 ? ?ICH Score: 1-for infratentorial ? ?ROS: Full ROS was performed and is negative except as noted in the HPI.  ?Past Medical History:  ?Diagnosis Date  ? Allergy   ? BPH (benign prostatic hypertrophy)   ? Cancer [facility])   ? Melanoma on Neck    2008  ? Carotid artery occlusion   ? Diabetes mellitus   ? ED (erectile dysfunction)   ? Hyperlipidemia   ? Hypertension   ? Retinopathy due to secondary DM ([place])   ? ?Family  History  ?Problem Relation Age of Onset  ? COPD Mother   ? Heart disease Father   ? Heart disease Brother   ?     MI at age 31  ? ? ? ?Social History:  ? reports that he quit smoking about 30 years ago. He has never used smokeless tobacco. He reports that he does not drink alcohol and does not use drugs. ? ?Medications ? ?Current Facility-Administered Medications:  ?  acetaminophen (TYLENOL) tablet 650 mg, 650 mg, Oral, Q6H PRN, Adefeso, Oladapo, DO, 650 mg at 06/11/21 1655 ?  amLODipine (NORVASC) tablet 5 mg, 5 mg, Oral, Daily, Little Ishikawa, MD, 5 mg at 06/11/21 1425 ?  dextrose 50 % solution 0-50 mL, 0-50 mL, Intravenous, PRN, Adefeso, Oladapo, DO ?  hydrALAZINE (APRESOLINE) injection 10 mg, 10 mg, Intravenous, Q4H PRN, Little Ishikawa, MD ?  insulin aspart (novoLOG) injection 0-15 Units, 0-15 Units, Subcutaneous, TID WC, Little Ishikawa, MD, 8 Units at 06/11/21 1809 ?  insulin aspart (novoLOG) injection 0-5 Units, 0-5 Units, Subcutaneous, QHS, Little Ishikawa, MD ?  losartan (COZAAR) tablet 50 mg, 50 mg, Oral, Daily, Little Ishikawa, MD, 50 mg at 06/11/21 1111 ? ? ?Exam: ?Current vital signs: ?BP (!) 152/74 (BP Location: Left Arm)   Pulse 73   Temp 97.6 ?F (36.4 ?C) (Oral)   Resp 18   Ht 5\' 7"  (1.702 m)   Wt  67.2 kg   SpO2 100%   BMI 23.20 kg/m?  ?Vital signs in last 24 hours: ?Temp:  [97.3 ?F (36.3 ?C)-98.1 ?F (36.7 ?C)] 97.6 ?F (36.4 ?C) (04/11 1933) ?Pulse Rate:  [65-95] 73 (04/11 1933) ?Resp:  [14-25] 18 (04/11 1933) ?BP: (116-169)/(59-99) 152/74 (04/11 1933) ?SpO2:  [93 %-100 %] 100 % (04/11 1933) ?Weight:  [67.2 kg] 67.2 kg (04/10 2115) ? ?GENERAL: Awake, alert in NAD ?HEENT: - Normocephalic and atraumatic, dry mm, no LN++, no Thyromegally ?LUNGS - Clear to auscultation bilaterally with no wheezes ?CV - S1S2 RRR, no m/r/g, equal pulses bilaterally. ?ABDOMEN - Soft, nontender, nondistended with normoactive BS ?Ext: warm, well perfused, intact peripheral pulses, no  edema ? ?NEURO:  ?Mental Status: AA&Ox3  ?Language: speech is nondysarthric, naming, repetition, fluency, and comprehension intact. ?Cranial Nerves: PERRL EOMI, visual fields full, no facial asymmetry, facial sensation intact, hearing intact, tongue/uvula/soft palate midline, normal sternocleidomastoid and trapezius muscle strength. No evidence of tongue atrophy or fibrillations ?Motor: Mild left-sided weakness without vertical drift.  Right side.  Without drift ?Tone: is normal and bulk is normal ?Sensation- Intact to light touch bilaterally ?Coordination: Ataxic on finger-to-nose and heel-knee-shin on the left.  Intact on the right ?Gait- deferred ? ?NIHSS ?1a Level of Conscious.: 0 ?1b LOC Questions: 0 ?1c LOC Commands: 0 ?2 Best Gaze: 0 ?3 Visual: 0 ?4 Facial Palsy: 0 ?5a Motor Arm - left: 0 ?5b Motor Arm - Right: 0 ?6a Motor Leg - Left: 0 ?6b Motor Leg - Right: 0 ?7 Limb Ataxia: 2 ?8 Sensory: 0 ?9 Best Language: 0 ?10 Dysarthria: 0 ?11 Extinct. and Inatten.: 0 ?TOTAL: 2 ? ? ?Labs ?I have reviewed labs in epic and the results pertinent to this consultation are: ?CBC ?   ?Component Value Date/Time  ? WBC 11.7 (H) 06/11/2021 0118  ? RBC 5.84 (H) 06/11/2021 0118  ? HGB 16.9 06/11/2021 0118  ? HCT 50.7 06/11/2021 0118  ? PLT 386 06/11/2021 0118  ? MCV 86.8 06/11/2021 0118  ? MCH 28.9 06/11/2021 0118  ? MCHC 33.3 06/11/2021 0118  ? RDW 13.1 06/11/2021 0118  ? LYMPHSABS 1,232 06/10/2021 1210  ? MONOABS 748 03/05/2016 0824  ? EOSABS 12 (L) 06/10/2021 1210  ? BASOSABS 24 06/10/2021 1210  ? ? ?CMP  ?   ?Component Value Date/Time  ? NA 132 (L) 06/11/2021 0118  ? K 4.2 06/11/2021 0118  ? CL 97 (L) 06/11/2021 0118  ? CO2 22 06/11/2021 0118  ? GLUCOSE 310 (H) 06/11/2021 0118  ? BUN 32 (H) 06/11/2021 0118  ? CREATININE 0.88 06/11/2021 0118  ? CREATININE 0.89 06/10/2021 1210  ? CALCIUM 9.6 06/11/2021 0118  ? PROT 8.2 (H) 06/11/2021 0118  ? ALBUMIN 4.2 06/11/2021 0118  ? AST 17 06/11/2021 0118  ? ALT 16 06/11/2021 0118  ?  ALKPHOS 165 (H) 06/11/2021 0118  ? BILITOT 1.0 06/11/2021 0118  ? GFRNONAA >60 06/11/2021 0118  ? GFRNONAA 88 03/05/2016 0824  ? GFRAA >89 03/05/2016 0824  ? ? ?Lipid Panel  ?   ?Component Value Date/Time  ? CHOL 131 04/29/2017 0822  ? TRIG 104 04/29/2017 0822  ? HDL 30 (L) 04/29/2017 2409  ? CHOLHDL 4.4 04/29/2017 0822  ? VLDL 20 03/05/2016 0824  ? Anderson 81 04/29/2017 0822  ? ? ? ?Imaging ?I have reviewed the images obtained: ? ?CT-head ?IMPRESSION: ?1. Acute to subacute intraparenchymal hemorrhage involving the left ?cerebellum measuring approximately 3.3 x 2.7 x 4.3 cm (estimated ?volume 19 mL). Surrounding low-density  vasogenic edema with mild ?regional mass effect. Partial effacement of the fourth ventricle ?which remains patent at this time. No hydrocephalus or ?transtentorial herniation. ?2. No other acute intracranial abnormality. ? ?Repeat CT head on arrival at [facility] ? ? ?MRI examination of the brain ?IMPRESSION: ?1. No significant interval change in size and morphology of left ?cerebellar intraparenchymal hemorrhage with similar regional mass ?effect. Stable partial effacement of the fourth ventricle without ?progressive hydrocephalus. ?2. No other new acute intracranial abnormality. ? ?Assessment:  ?71 year old man above past medical history came in for evaluation of deranged blood sugar levels as well as headache that started a week ago followed by dizziness and gait ataxia which did not improve.  Imaging revealed subacute intraparenchymal hemorrhage involving the left cerebellum of about 19 cc with surrounding low-density vasogenic edema with mild regional mass effect and partial effacement of the fourth ventricle which is patent without evidence of hydrocephalus or herniation. ?Blood pressures were not elevated on arrival.  I am not sure if this is a primary ICH or an ischemic stroke with hemorrhagic transformation. ?MRI of the brain would be helpful in getting further  information. ?Outside the window for any intervention from ischemic stroke perspective as there is frank bleeding as well as last known well is a week ago. ? ?Impression: ?Primary intracerebral hemorrhage versus hemorrhagic transformation

## 2021-06-11 NOTE — ED Notes (Signed)
Got the PT moved over into hospital bed and patient hooked up to monitor. ?

## 2021-06-12 ENCOUNTER — Inpatient Hospital Stay (HOSPITAL_COMMUNITY): Payer: PPO

## 2021-06-12 DIAGNOSIS — I614 Nontraumatic intracerebral hemorrhage in cerebellum: Secondary | ICD-10-CM | POA: Diagnosis not present

## 2021-06-12 DIAGNOSIS — I619 Nontraumatic intracerebral hemorrhage, unspecified: Secondary | ICD-10-CM | POA: Diagnosis not present

## 2021-06-12 DIAGNOSIS — I6389 Other cerebral infarction: Secondary | ICD-10-CM

## 2021-06-12 DIAGNOSIS — J189 Pneumonia, unspecified organism: Secondary | ICD-10-CM

## 2021-06-12 DIAGNOSIS — E785 Hyperlipidemia, unspecified: Secondary | ICD-10-CM | POA: Diagnosis present

## 2021-06-12 DIAGNOSIS — R918 Other nonspecific abnormal finding of lung field: Secondary | ICD-10-CM

## 2021-06-12 DIAGNOSIS — C3490 Malignant neoplasm of unspecified part of unspecified bronchus or lung: Secondary | ICD-10-CM

## 2021-06-12 DIAGNOSIS — E860 Dehydration: Secondary | ICD-10-CM | POA: Diagnosis not present

## 2021-06-12 LAB — COMPREHENSIVE METABOLIC PANEL
ALT: 14 U/L (ref 0–44)
AST: 18 U/L (ref 15–41)
Albumin: 3.4 g/dL — ABNORMAL LOW (ref 3.5–5.0)
Alkaline Phosphatase: 128 U/L — ABNORMAL HIGH (ref 38–126)
Anion gap: 11 (ref 5–15)
BUN: 17 mg/dL (ref 8–23)
CO2: 24 mmol/L (ref 22–32)
Calcium: 9.5 mg/dL (ref 8.9–10.3)
Chloride: 99 mmol/L (ref 98–111)
Creatinine, Ser: 0.86 mg/dL (ref 0.61–1.24)
GFR, Estimated: >60 mL/min (ref >60)
Glucose, Bld: 225 mg/dL — ABNORMAL HIGH (ref 70–99)
Potassium: 4.2 mmol/L (ref 3.5–5.1)
Sodium: 134 mmol/L — ABNORMAL LOW (ref 135–145)
Total Bilirubin: 0.8 mg/dL (ref 0.3–1.2)
Total Protein: 6.6 g/dL (ref 6.5–8.1)

## 2021-06-12 LAB — CBC WITH DIFFERENTIAL/PLATELET
Abs Immature Granulocytes: 0.04 10*3/uL (ref 0.00–0.07)
Basophils Absolute: 0 10*3/uL (ref 0.0–0.1)
Basophils Relative: 0 %
Eosinophils Absolute: 0.1 10*3/uL (ref 0.0–0.5)
Eosinophils Relative: 1 %
HCT: 46.7 % (ref 39.0–52.0)
Hemoglobin: 15.1 g/dL (ref 13.0–17.0)
Immature Granulocytes: 0 %
Lymphocytes Relative: 15 %
Lymphs Abs: 1.4 10*3/uL (ref 0.7–4.0)
MCH: 28.1 pg (ref 26.0–34.0)
MCHC: 32.3 g/dL (ref 30.0–36.0)
MCV: 86.8 fL (ref 80.0–100.0)
Monocytes Absolute: 0.7 10*3/uL (ref 0.1–1.0)
Monocytes Relative: 7 %
Neutro Abs: 7.3 10*3/uL (ref 1.7–7.7)
Neutrophils Relative %: 77 %
Platelets: 343 10*3/uL (ref 150–400)
RBC: 5.38 MIL/uL (ref 4.22–5.81)
RDW: 13 % (ref 11.5–15.5)
WBC: 9.6 10*3/uL (ref 4.0–10.5)
nRBC: 0 % (ref 0.0–0.2)

## 2021-06-12 LAB — ECHOCARDIOGRAM COMPLETE
AR max vel: 3.01 cm2
AV Peak grad: 4.5 mmHg
Ao pk vel: 1.07 m/s
Area-P 1/2: 4.08 cm2
Height: 67 in
S' Lateral: 3 cm
Single Plane A4C EF: 44.7 %
Weight: 2370.39 oz

## 2021-06-12 LAB — LIPID PANEL
Cholesterol: 266 mg/dL — ABNORMAL HIGH (ref 0–200)
HDL: 24 mg/dL — ABNORMAL LOW (ref >40)
LDL Cholesterol: 213 mg/dL — ABNORMAL HIGH (ref 0–99)
Total CHOL/HDL Ratio: 11.1 RATIO
Triglycerides: 144 mg/dL (ref <150)
VLDL: 29 mg/dL (ref 0–40)

## 2021-06-12 LAB — GLUCOSE, CAPILLARY
Glucose-Capillary: 193 mg/dL — ABNORMAL HIGH (ref 70–99)
Glucose-Capillary: 183 mg/dL — ABNORMAL HIGH (ref 70–99)
Glucose-Capillary: 179 mg/dL — ABNORMAL HIGH (ref 70–99)
Glucose-Capillary: 190 mg/dL — ABNORMAL HIGH (ref 70–99)

## 2021-06-12 LAB — MAGNESIUM: Magnesium: 2 mg/dL (ref 1.7–2.4)

## 2021-06-12 LAB — STREP PNEUMONIAE URINARY ANTIGEN: Strep Pneumo Urinary Antigen: NEGATIVE

## 2021-06-12 MED ORDER — TRAMADOL HCL 50 MG PO TABS
50.0000 mg | ORAL_TABLET | Freq: Four times a day (QID) | ORAL | Status: DC | PRN
  Administered 2021-06-12 – 2021-06-13 (×3): 50 mg via ORAL
  Filled 2021-06-12 (×3): qty 1

## 2021-06-12 MED ORDER — GUAIFENESIN-DM 100-10 MG/5ML PO SYRP
5.0000 mL | ORAL_SOLUTION | ORAL | Status: DC | PRN
  Administered 2021-06-12 – 2021-06-14 (×5): 5 mL via ORAL
  Filled 2021-06-12 (×5): qty 5

## 2021-06-12 MED ORDER — INSULIN GLARGINE-YFGN 100 UNIT/ML ~~LOC~~ SOLN: 5.0000 [IU] | Freq: Every day | SUBCUTANEOUS | Status: DC

## 2021-06-12 MED ORDER — SODIUM CHLORIDE 0.9 % IV SOLN
2.0000 g | INTRAVENOUS | Status: AC
  Administered 2021-06-12 – 2021-06-16 (×5): 2 g via INTRAVENOUS
  Filled 2021-06-12 (×5): qty 20

## 2021-06-12 MED ORDER — LORAZEPAM 0.5 MG PO TABS
0.5000 mg | ORAL_TABLET | Freq: Once | ORAL | Status: DC | PRN
Start: 2021-06-12 — End: 2021-06-20

## 2021-06-12 MED ORDER — SODIUM CHLORIDE 0.9 % IV SOLN
500.0000 mg | INTRAVENOUS | Status: AC
  Administered 2021-06-12 – 2021-06-16 (×5): 500 mg via INTRAVENOUS
  Filled 2021-06-12 (×5): qty 5

## 2021-06-12 MED ORDER — GADOBUTROL 1 MMOL/ML IV SOLN
6.0000 mL | Freq: Once | INTRAVENOUS | Status: AC | PRN
  Administered 2021-06-12: 6 mL via INTRAVENOUS

## 2021-06-12 MED ORDER — INSULIN GLARGINE-YFGN 100 UNIT/ML ~~LOC~~ SOLN: 8.0000 [IU] | Freq: Every day | SUBCUTANEOUS | Status: DC

## 2021-06-12 MED ORDER — INSULIN GLARGINE-YFGN 100 UNIT/ML ~~LOC~~ SOLN
10.0000 [IU] | Freq: Every day | SUBCUTANEOUS | Status: DC
  Administered 2021-06-12 – 2021-06-13 (×2): 10 [IU] via SUBCUTANEOUS
  Filled 2021-06-12 (×4): qty 0.1

## 2021-06-12 MED ORDER — ATORVASTATIN CALCIUM 40 MG PO TABS
40.0000 mg | ORAL_TABLET | Freq: Every day | ORAL | Status: DC
  Administered 2021-06-12 – 2021-06-20 (×9): 40 mg via ORAL
  Filled 2021-06-12 (×10): qty 1

## 2021-06-12 MED ORDER — LORAZEPAM 2 MG/ML IJ SOLN
0.5000 mg | Freq: Once | INTRAMUSCULAR | Status: DC
  Filled 2021-06-12: qty 1

## 2021-06-12 MED ORDER — LORAZEPAM 2 MG/ML IJ SOLN
2.0000 mg | Freq: Once | INTRAMUSCULAR | Status: AC | PRN
  Administered 2021-06-12: 2 mg via INTRAVENOUS
  Filled 2021-06-12: qty 1

## 2021-06-12 NOTE — Progress Notes (Signed)
PT Cancellation Note ? ?Patient Details ?Name: Micheal Donovan ?MRN: [id number] ?DOB: 1950-03-08 ? ? ?Cancelled Treatment:    Reason Eval/Treat Not Completed: Medical issues which prohibited therapy.  Pt is on bedrest and sedated from an earlier procedure.  Will hold until 4/13 ? ? ?[name] ?06/12/2021, 5:43 PM ?

## 2021-06-12 NOTE — Progress Notes (Signed)
SLP Cancellation Note ? ?Patient Details ?Name: Micheal Donovan ?MRN: [id number] ?DOB: 15-Mar-1950 ? ? ?Cancelled treatment:       Reason Eval/Treat Not Completed: SLP screened, no needs identified, will sign off  Pt passed [facility] swallow screen twice; therefore, no formalized SLP swallow eval is needed per protocol. SLP will follow for speech-language-cognition evaluation only.  ? ?Micheal Donovan, [place], CCC-SLP ?Acute Rehabilitation Services ?Office number [phone] ?Pager [phone] ? ? ?[name] ?06/12/2021, 2:13 PM ?

## 2021-06-12 NOTE — Progress Notes (Signed)
Inpatient Diabetes Program Recommendations ? ?AACE/ADA: New Consensus Statement on Inpatient Glycemic Control (2015) ? ?Target Ranges:  Prepandial:   less than 140 mg/dL ?     Peak postprandial:   less than 180 mg/dL (1-2 hours) ?     Critically ill patients:  140 - 180 mg/dL  ? ?Lab Results  ?Component Value Date  ? GLUCAP 183 (H) 06/12/2021  ? HGBA1C 13.6 (H) 06/10/2021  ? ? ?Attempted to speak with pt at bedside regarding A1c of 13.6% and glucose control at home. Pt is very drowsy unable to keep eyes open. I noticed pt received Ativan for MRI approx 2 hours ago. Will try to see pt tomorrow. ? ?Thanks, ? ?Tama Headings RN, MSN, BC-ADM ?Inpatient Diabetes Coordinator ?Team Pager [phone] (8a-5p) ? ? ? ? ? ?

## 2021-06-12 NOTE — Progress Notes (Signed)
?PROGRESS NOTE ? ? ? Micheal Donovan  FVC:[id number] DOB: 1950/04/27 DOA: 06/10/2021 ?PCP: [name], MD ? ? ?Chief Complaint  ?Patient presents with  ? Hyperglycemia  ? ? ?Brief Narrative:  ?Micheal Donovan is a 71 y.o. male with medical history significant of T2DM, hypertension who presents to the emergency department due to 1 week onset of generalized weakness, dizziness and headache in the posterior part of head.   ?  ?Patient indicates he has been out of home blood pressure and diabetes medications for months, presented to PCP with notable hyperglycemia, hypertension and profound dehydration and subsequently sent to the hospital for further evaluation and treatment. ?  ?Unfortunately in the ED patient's hyperglycemia was profound requiring insulin drip overnight, patient's headache was followed up with CT head noncontrast which was remarkable for acute to subacute intraparenchymal hemorrhage of the left cerebellum 3.3 x 2.7 x 4.3 cm around 19 mL.  Given these acute findings patient's blood pressure was aggressively controlled, patient is transferred to [place] for closer neurology and neurosurgical follow-up. ? ? ?Assessment & Plan: ?  ?Principal Problem: ?  Intraparenchymal hemorrhage of brain ([place]) ?Active Problems: ?  Essential hypertension ?  Type 2 diabetes mellitus with hyperglycemia (HCC) ?  Pseudohyponatremia ?  Dehydration ?  Leukocytosis ?  Thrombocytosis ?  Hypercalcemia ?  Hyperlipidemia ?  CAP (community acquired pneumonia) ? ?#1 subacute intraparenchymal hemorrhage of the brain versus hemorrhagic transformation of ischemic stroke/headache ?-Patient noted to have presented with elevated blood sugar levels, noted to have some dizziness and gait ataxia not improving over the past week. ?-CT head done concerning for subacute intraparenchymal hemorrhage involving the left cerebellum of about 19 cc with surrounding low-density vasogenic edema and mild regional mass effect and partial  effacement of fourth ventricle which was patent without evidence of hydrocephalus or herniation. ?-MRI brain done with no significant interval change in size of left cerebellar intraparenchymal hematoma with unchanged regional mass effect and partial effacement of fourth ventricle but no upstream hydrocephalus. ?-2D echo EF 55 to 63%,WGYK, grade 1 diastolic dysfunction, normal right ventricular systolic function, no source of emboli noted. 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  Findings suspected to be incidental in nature and unlikely to be source of left cerebellar hemorrhage.  No other vascular abnormality seen underlying left cerebellar bleed.  Moderate atheromatous stenosis involving the dominant mid right V4 segment.  Patchy consolidative left upper lobe opacity suspicious for pneumonia.  Emphysema. ?-LDL 213, hemoglobin A1c 13.6. ?-Start Lipitor. ?-BP control with goal systolic blood pressure < 160. ?-PT/OT/ST. ?-Neurology following and I appreciate their input and recommendations. ? ?2.  DKA versus honk/anion gap metabolic acidosis in the setting of uncontrolled diabetes mellitus/hyperglycemia ?-On admission at [facility] patient was placed on the Endo tool/insulin drip and subsequently transitioned off insulin drip. ?-Hemoglobin A1c 13.6. ?-CBG 193 this morning. ?-Placed on Semglee 10 units daily. ?-SSI. ?-Diabetes coordinator following. ? ?3.  Pulmonary mass noted on chest x-ray versus pneumonia ?-Chest x-ray done with a 4.4 cm left midlung pulmonary mass concerning for malignancy. ?-CT angiogram head and neck done with  some concern for patchy consolidative  left upper lobe opacity suspicious for pneumonia. ?-We will get a CT chest for further evaluation. ?-Check a urine Legionella antigen, check a urine pneumococcus antigen. ?-Place empirically on IV Rocephin and azithromycin. ?-Supportive care. ? ?4.  Medication noncompliance ?-Dr. Avon Gully discussed with patient at bedside need for medication compliance given his hypertension, concurrent intraparenchymal hemorrhage in DKA. ? ?5.  Pseudohyponatremia ?-Improved with correction of hyperglycemia. ? ?6.  Hypertension ?-Continue Norvasc, Cozaar. ? ?7.  Hyperglycemia ?-Resolved. ? ?8.  Dehydration ?-Improved with hydration. ? ?9.  Hemoconcentration with notable leukocytosis, thrombocytosis and polycythemia ?Improved with hydration ? ? ?DVT prophylaxis: SCDs ?Code Status: Full ?Family Communication: No family at bedside. ?Disposition: TBD ? ?Status is: Inpatient ?Remains inpatient appropriate because: Severity of illness ?  ?Consultants:  ?Neurology: Dr. Rory Percy 06/11/2021 ? ?Procedures:  ?CT head 06/11/2021 ?CT angiogram head and neck 06/12/2021 ?Chest x-ray 06/12/2021 ?MRI brain 06/12/2021 ?2D echo 06/12/2021 ? ? ?Antimicrobials:  ?Anti-infectives (From admission, onward)  ? ? Start     Dose/Rate Route Frequency Ordered Stop  ? 06/12/21 1030  cefTRIAXone (ROCEPHIN) 2 g in sodium chloride 0.9 % 100 mL IVPB       ? 2 g ?200 mL/hr over 30 Minutes Intravenous Every 24 hours 06/12/21 0930 06/17/21 1029  ? 06/12/21 1030  azithromycin (ZITHROMAX) 500 mg in sodium chloride 0.9 % 250 mL IVPB       ? 500 mg ?250 mL/hr over 60 Minutes Intravenous Every 24 hours 06/12/21 0930 06/17/21 1029  ? ?  ?  ? ? ?Subjective: ?Patient sedated.  Just returned from MRI.  Opens eyes temporarily to verbal stimuli and drifts back off to sleep.  Following some commands. ? ?Objective: ?Vitals:  ? 06/11/21 1933 06/12/21 0409 06/12/21 0716 06/12/21 1059  ?BP: (!) 152/74 130/65 140/73 (!) 144/78  ?Pulse: 73 71 73 82  ?Resp:  18 16 18    ?Temp: 97.6 ?F (36.4 ?C) 97.6 ?F (36.4 ?C) 97.7 ?F (36.5 ?C)   ?TempSrc: Oral Oral Oral   ?SpO2: 100% 97% 96% 97%  ?Weight:      ?Height:      ? ? ?Intake/Output Summary (Last 24 hours) at 06/12/2021 1316 ?Last data filed at 06/12/2021 0400 ?Gross per 24 hour  ?Intake 290 ml  ?Output --  ?Net 290 ml  ? ?Filed Weights  ? 06/10/21 2115  ?Weight: 67.2 kg  ? ? ?Examination: ? ?General exam: Sedated ?Respiratory system: Clear to auscultation anterior lung fields with. Respiratory effort normal. ?Cardiovascular system: S1 & S2 heard, RRR. No JVD, murmurs, rubs, gallops or clicks. No pedal edema. ?Gastrointestinal system: Abdomen is nondistended, soft and nontender. No organomegaly or masses felt. Normal bowel sounds heard. ?Central nervous system: Sedated.  Moving extremities spontaneously.[name] ?Extremities: Symmetric 5 x 5 power. ?Skin: No rashes, lesions or ulcers ?Psychiatry: Judgement and insight unable to assess as patient sedated. ? ? ? ?Data Reviewed: I have personally reviewed following labs and imaging studies ? ?CBC: ?Recent Labs  ?Lab 06/10/21 ?1210 06/10/21 ?2141 06/11/21 ?0118 06/12/21 ?3664  ?WBC 12.2* 12.5* 11.7* 9.6  ?NEUTROABS 10,260*  --   --  7.3  ?HGB 18.1* 17.4* 16.9 15.1  ?HCT 55.1* 52.6* 50.7 46.7  ?MCV 87.6 86.4 86.8 86.8  ?PLT 463* 471* 386 343  ? ? ?Basic Metabolic Panel: ?Recent Labs  ?Lab 06/10/21 ?1210 06/10/21 ?2141 06/11/21 ?0118 06/12/21 ?4034  ?NA 130* 129* 132* 134*  ?K 5.2 5.3* 4.2 4.2  ?CL 90* 92* 97* 99  ?CO2 22 20* 22 24  ?  GLUCOSE 393* 365* 310* 225*  ?BUN 31* 34* 32* 17  ?CREATININE 0.89 1.00 0.88 0.86  ?CALCIUM 10.8* 10.0 9.6 9.5  ?MG  --   --  2.3 2.0  ?PHOS  --   --  4.2  --   ? ? ?GFR: ?Estimated Creatinine Clearance: 74.7 mL/min (by C-G formula based on SCr of 0.86 mg/dL). ? ?Liver Function Tests: ?Recent Labs  ?Lab 06/10/21 ?1210 06/11/21 ?0118 06/12/21 ?3361  ?AST 10 17 18   ?ALT 12 16 14   ?ALKPHOS  --  165* 128*  ?BILITOT 0.7 1.0 0.8  ?PROT 8.2* 8.2* 6.6  ?ALBUMIN  --   4.2 3.4*  ? ? ?CBG: ?Recent Labs  ?Lab 06/11/21 ?1156 06/11/21 ?1720 06/11/21 ?2159 06/12/21 ?2244 06/12/21 ?1313  ?GLUCAP 225* 270* 166* 193* 183*  ? ? ? ?Recent Results (from the past 240 hour(s))  ?Res

## 2021-06-12 NOTE — Plan of Care (Signed)
Pt is alert oriented x 4. Pt has been bedrest. Pt c/o pain to back of head 7/10, received x 1 order for ibuprofen. Pt had order for tylenol but it was not effective. This am pt c/o headache again, needing something stronger. CBG completed. Pts breakfast ordered. Pts wife present at bed side. NIH completed per order.   ? ?Problem: Education: ?Goal: Knowledge of General Education information will improve ?Description: Including pain rating scale, medication(s)/side effects and non-pharmacologic comfort measures ?Outcome: Progressing ?  ?Problem: Health Behavior/Discharge Planning: ?Goal: Ability to manage health-related needs will improve ?Outcome: Progressing ?  ?Problem: Clinical Measurements: ?Goal: Ability to maintain clinical measurements within normal limits will improve ?Outcome: Progressing ?Goal: Will remain free from infection ?Outcome: Progressing ?Goal: Diagnostic test results will improve ?Outcome: Progressing ?Goal: Respiratory complications will improve ?Outcome: Progressing ?Goal: Cardiovascular complication will be avoided ?Outcome: Progressing ?  ?Problem: Activity: ?Goal: Risk for activity intolerance will decrease ?Outcome: Progressing ?  ?Problem: Nutrition: ?Goal: Adequate nutrition will be maintained ?Outcome: Progressing ?  ?Problem: Coping: ?Goal: Level of anxiety will decrease ?Outcome: Progressing ?  ?Problem: Elimination: ?Goal: Will not experience complications related to bowel motility ?Outcome: Progressing ?Goal: Will not experience complications related to urinary retention ?Outcome: Progressing ?  ?Problem: Pain Managment: ?Goal: General experience of comfort will improve ?Outcome: Progressing ?  ?Problem: Safety: ?Goal: Ability to remain free from injury will improve ?Outcome: Progressing ?  ?Problem: Skin Integrity: ?Goal: Risk for impaired skin integrity will decrease ?Outcome: Progressing ?  ?

## 2021-06-12 NOTE — Progress Notes (Signed)
SLP Cancellation Note ? ?Patient Details ?Name: Micheal Donovan ?MRN: [id number] ?DOB: 03/26/1950 ? ? ?Cancelled treatment:       Reason Eval/Treat Not Completed: Other (per RN, pt just given dose of ativan prior to procedure/test. Asked that SLP defer bedside swallow eval and speech/lang eval till later in the day. Will f/u as able).  ? ? ? ? ?Ellwood Dense, MA, CCC-SLP ?Acute Rehabilitation Services ?Office Number: [phone] ? ?[name] ?06/12/2021, 1:01 PM ?

## 2021-06-12 NOTE — Evaluation (Signed)
Occupational Therapy Evaluation ?Patient Details ?Name: Micheal Donovan ?MRN: [id number] ?DOB: April 09, 1950 ?Today's Date: 06/12/2021 ? ? ?History of Present Illness Micheal Donovan is a 71 y.o. male who presented to [facility] for evaluation of ongoing headache, dizziness and gait ataxia. CT revealed acute to subacute intraparenchymal hemorrhage involving the left  cerebellum. Past medical history of uncontrolled diabetes, hypertension, hyperlipidemia, retinopathy from diabetes, based on carotid ultrasound 2014-bilateral ICA stenosis 60 to 79%  ? ?Clinical Impression ?  ?[name] was evaluated s/p the above admission list, he is generally indep at baseline but has been needing a RW the past few days due to the above symptoms. He lives in a 1 level home with his wife who can assist 24/7 at d/c. Upon evaluation pt required min A for LB dressing and functional ambulation with RW due to dizziness, pain with head movement and unsteady balance. He is also limited by activity tolerance, only able to tolerate short ambulation with RW. He will benefit from continued OT to address the limitations listed below. Recommend d/c to home with OP neuro OT. *AIR discussed with pt and wife, Collie Siad, pt declining AIR/post acute rehab stay and would like to go  home when medically appropriate.  ?   ? ?Recommendations for follow up therapy are one component of a multi-disciplinary discharge planning process, led by the attending physician.  Recommendations may be updated based on patient status, additional functional criteria and insurance authorization.  ? ?Follow Up Recommendations ? Outpatient OT  ?  ?Assistance Recommended at Discharge Intermittent Supervision/Assistance  ?Patient can return home with the following A little help with walking and/or transfers;A little help with bathing/dressing/bathroom;Assist for transportation;Help with stairs or ramp for entrance ? ?  ?Functional Status Assessment ? Patient has had a recent decline in  their functional status and demonstrates the ability to make significant improvements in function in a reasonable and predictable amount of time.  ?Equipment Recommendations ? None recommended by OT  ?  ?   ?Precautions / Restrictions Precautions ?Precautions: Fall ?Restrictions ?Weight Bearing Restrictions: No  ? ?  ? ?Mobility Bed Mobility ?Overal bed mobility: Modified Independent ?  ?  ?  ?  ?  ?  ?General bed mobility comments: increased time needed for pain/dizzy management ?  ? ?Transfers ?Overall transfer level: Needs assistance ?Equipment used: Rolling walker (2 wheels) ?Transfers: Sit to/from Stand, Bed to chair/wheelchair/BSC ?Sit to Stand: Min assist ?  ?  ?Step pivot transfers: Min assist ?  ?  ?General transfer comment: min A for balance in standing, unsteady due to pain and dizziness. Unable to tolerate further distances ?  ? ?  ?Balance Overall balance assessment: Needs assistance ?Sitting-balance support: Feet supported ?Sitting balance-Leahy Scale: Fair ?  ?  ?Standing balance support: Bilateral upper extremity supported, During functional activity ?Standing balance-Leahy Scale: Poor ?  ?  ?  ?  ?   ? ?ADL either performed or assessed with clinical judgement  ? ?ADL Overall ADL's : Needs assistance/impaired ?Eating/Feeding: Independent;Sitting ?  ?Grooming: Oral care;Set up;Sitting ?Grooming Details (indicate cue type and reason): unable to tolerate standing to groom this date ?Upper Body Bathing: Set up;Sitting ?  ?Lower Body Bathing: Minimal assistance;Sit to/from stand ?  ?Upper Body Dressing : Set up;Cueing for UE precautions ?  ?Lower Body Dressing: Minimal assistance;Sit to/from stand ?  ?Toilet Transfer: Minimal assistance;Ambulation;Rolling walker (2 wheels) ?  ?Toileting- Clothing Manipulation and Hygiene: Min guard;Sitting/lateral lean ?  ?  ?  ?Functional mobility during  ADLs: Minimal assistance;Rolling walker (2 wheels) ?General ADL Comments: required incrased time for all tasks due to  headache adn dizziness. Min A for steadying and balance with RW. Unable to tolerate standing ADLs for more than a few seconds  ? ? ? ?Vision Baseline Vision/History: 1 Wears glasses ?Ability to See in Adequate Light: 1 Impaired ?Patient Visual Report: No change from baseline ?Vision Assessment?: Vision impaired- to be further tested in functional context ?Additional Comments: diabetic ret at baseline, wears glasses, denies any acute changes in vision  ?   ?   ?   ? ?Pertinent Vitals/Pain Pain Assessment ?Pain Assessment: Faces ?Faces Pain Scale: Hurts even more ?Pain Location: head ?Pain Descriptors / Indicators: Headache, Discomfort ?Pain Intervention(s): Limited activity within patient's tolerance, Monitored during session  ? ? ? ?Hand Dominance Right ?  ?Extremity/Trunk Assessment Upper Extremity Assessment ?Upper Extremity Assessment: LUE deficits/detail;RUE deficits/detail ?RUE Deficits / Details: ROM and MMT are [facility], pt is slow and deliberate for assessment. poor coordination for finger to nose ?RUE Coordination: decreased fine motor ?LUE Deficits / Details: ROM and MMT are [facility], pt is slow and deliberate for assessment. poor coordination for finger to nose ?LUE Coordination: decreased fine motor ?  ?Lower Extremity Assessment ?Lower Extremity Assessment: Defer to PT evaluation ?  ?Cervical / Trunk Assessment ?Cervical / Trunk Assessment: Normal ?  ?Communication Communication ?Communication: No difficulties ?  ?Cognition Arousal/Alertness: Awake/alert ?Behavior During Therapy: Flat affect ?Overall Cognitive Status: Impaired/Different from baseline ?Area of Impairment: Attention, Following commands, Safety/judgement ?  ?  ?  ?  ?  ?  ?  ?  ?  ?Current Attention Level: Selective ?  ?Following Commands: Follows one step commands consistently ?Safety/Judgement: Decreased awareness of safety ?  ?  ?General Comments: PT with flat affect this date with limited verbalizations and required increased time for all  tasks ?  ?  ?General Comments  VSS on RA, wife present and supportive ? ?  ?   ?   ? ? ?Home Living Family/patient expects to be discharged to:: Private residence ?Living Arrangements: Spouse/significant other ?Available Help at Discharge: Family;Available 24 hours/day ?Type of Home: House ?Home Access: Stairs to enter ?Entrance Stairs-Number of Steps: 3 or 1 in the garage ?Entrance Stairs-Rails: None ?Home Layout: One level ?  ?  ?Bathroom Shower/Tub: Walk-in shower ?  ?Bathroom Toilet: Standard ?  ?  ?Home Equipment: Conservation officer, nature (2 wheels);Shower seat - built in ?  ?Additional Comments: wife: Collie Siad ?  ? ?  ?Prior Functioning/Environment Prior Level of Function : Needs assist ?  ?  ?  ?  ?  ?  ?Mobility Comments: normally indep without AD, has been using a R the past few days ?ADLs Comments: indep including IADLs ?  ? ?  ?  ?OT Problem List: Decreased strength;Decreased range of motion;Decreased activity tolerance;Impaired balance (sitting and/or standing);Decreased coordination;Decreased safety awareness;Decreased knowledge of use of DME or AE;Decreased knowledge of precautions;Pain ?  ?   ?OT Treatment/Interventions: Self-care/ADL training;Therapeutic exercise;Balance training;Patient/family education;Therapeutic activities;DME and/or AE instruction  ?  ?OT Goals(Current goals can be found in the care plan section) Acute Rehab OT Goals ?Patient Stated Goal: home ?OT Goal Formulation: With patient ?Time For Goal Achievement: 06/26/21 ?Potential to Achieve Goals: Good ?ADL Goals ?Pt Will Perform Lower Body Dressing: with modified independence;sit to/from stand ?Pt Will Transfer to Toilet: with modified independence;ambulating ?Additional ADL Goal #1: Pt will demonstrate increased activey tolerance to complete at least 3 ADLs in standing with mod I ?Additional  ADL Goal #2: Pt will indep complete IADL medication management task  ?OT Frequency: Min 2X/week ?  ? ?   ?AM-PAC OT "6 Clicks" Daily Activity     ?Outcome  Measure Help from another person eating meals?: None ?Help from another person taking care of personal grooming?: A Little ?Help from another person toileting, which includes using toliet, bedpan, or urinal?:

## 2021-06-12 NOTE — Progress Notes (Addendum)
STROKE TEAM PROGRESS NOTE  ? ?INTERVAL HISTORY ?His wife is at the bedside.  Patient reports he had a headache for about a week, took aspirin  and ibuprofen alternatingly all week.  He noticed that his blood sugars have been high recently as well and states his blood pressure was elevated at the outlying hospital.  He was stunned started on the endocrine protocol, hyperglycemia has improved.  Blood pressure has been less than 160 ?CT head x2 shows stable large left cerebellar parenchymal hematoma with mild cytotoxic edema and mass effect on fourth ventricle.    ?Vitals:  ? 06/11/21 1842 06/11/21 1933 06/12/21 0409 06/12/21 0716  ?BP:  (!) 152/74 130/65 140/73  ?Pulse:  73 71 73  ?Resp:  18 16 18   ?Temp: 97.6 ?F (36.4 ?C) 97.6 ?F (36.4 ?C) 97.6 ?F (36.4 ?C) 97.7 ?F (36.5 ?C)  ?TempSrc: Oral Oral Oral Oral  ?SpO2:  100% 97% 96%  ?Weight:      ?Height:      ? ?CBC:  ?Recent Labs  ?Lab 06/10/21 ?1210 06/10/21 ?2141 06/11/21 ?0118  ?WBC 12.2* 12.5* 11.7*  ?NEUTROABS 10,260*  --   --   ?HGB 18.1* 17.4* 16.9  ?HCT 55.1* 52.6* 50.7  ?MCV 87.6 86.4 86.8  ?PLT 463* 471* 386  ? ?Basic Metabolic Panel:  ?Recent Labs  ?Lab 06/10/21 ?2141 06/11/21 ?0118  ?NA 129* 132*  ?K 5.3* 4.2  ?CL 92* 97*  ?CO2 20* 22  ?GLUCOSE 365* 310*  ?BUN 34* 32*  ?CREATININE 1.00 0.88  ?CALCIUM 10.0 9.6  ?MG  --  2.3  ?PHOS  --  4.2  ? ?Lipid Panel:  ?Recent Labs  ?Lab 06/12/21 ?5974  ?CHOL 266*  ?TRIG 144  ?HDL 24*  ?CHOLHDL 11.1  ?VLDL 29  ?[place] 213*  ? ?HgbA1c:  ?Recent Labs  ?Lab 06/10/21 ?1210  ?HGBA1C 13.6*  ? ?Urine Drug Screen:  ?Recent Labs  ?Lab 06/11/21 ?0239  ?LABOPIA NONE DETECTED  ?COCAINSCRNUR NONE DETECTED  ?LABBENZ NONE DETECTED  ?AMPHETMU NONE DETECTED  ?THCU NONE DETECTED  ?LABBARB NONE DETECTED  ?  ?Alcohol Level  ?Recent Labs  ?Lab 06/11/21 ?0118  ?ETH <10  ? ? ?IMAGING past 24 hours ?CT ANGIO HEAD NECK W WO CM ? ?Result Date: 06/12/2021 ?CLINICAL DATA:  Follow-up examination for hemorrhagic stroke. EXAM: CT ANGIOGRAPHY HEAD AND  NECK TECHNIQUE: Multidetector CT imaging of the head and neck was performed using the standard protocol during bolus administration of intravenous contrast. Multiplanar CT image reconstructions and MIPs were obtained to evaluate the vascular anatomy. Carotid stenosis measurements (when applicable) are obtained utilizing NASCET criteria, using the distal internal carotid diameter as the denominator. RADIATION DOSE REDUCTION: This exam was performed according to the departmental dose-optimization program which includes automated exposure control, adjustment of the mA and/or kV according to patient size and/or use of iterative reconstruction technique. CONTRAST:  73mL OMNIPAQUE IOHEXOL 350 MG/ML SOLN COMPARISON:  Prior CT from earlier the same day. FINDINGS: CTA NECK FINDINGS Aortic arch: Visualized aortic arch normal caliber with normal branch pattern. Moderate atheromatous change about the arch and origin of the great vessels without high-grade stenosis. Right carotid system: Right CCA patent from its origin to the bifurcation without stenosis. Atheromatous change about the right carotid bulb/proximal right ICA without significant stenosis. Right ICA patent distally without stenosis or dissection. Left carotid system: Left CCA patent from its origin to the bifurcation without significant stenosis. Bulky calcified plaque about the left carotid bulb/proximal cervical left ICA with associated  stenosis of up to 75% by NASCET criteria. Left ICA tortuous but widely patent distally without stenosis or dissection. Vertebral arteries: Both vertebral arteries arise from subclavian arteries. No proximal subclavian artery stenosis. Right vertebral artery slightly dominant. Moderate approximate 50% stenosis noted at the origin of the left vertebral artery. Vertebral arteries patent distally without stenosis or dissection. Skeleton: Prior fusion at C5-6 with solid arthrodesis. Underlying moderate multilevel cervical spondylosis.  Other neck: No other acute soft tissue abnormality within the neck. Few small thyroid nodules noted, largest of which measures 6 mm at the right thyroid lobe, of doubtful significance given size and patient age, no follow-up imaging recommended (ref: J Am Coll Radiol. 2015 Feb;12(2): 143-50). Upper chest: Patchy consolidative opacity partially visualize within the posterior left upper lobe, suspicious for pneumonia. Underlying emphysema. Review of the MIP images confirms the above findings CTA HEAD FINDINGS Anterior circulation: Petrous segments patent bilaterally. Mild atheromatous irregularity within the carotid siphons without significant stenosis. A1 segments patent bilaterally. Left A1 dominant. Normal anterior communicating artery complex. Anterior cerebral arteries patent without significant stenosis. No M1 stenosis or occlusion. No proximal MCA branch occlusion. Distal MCA branches perfused and symmetric. Posterior circulation: Moderate atheromatous stenosis noted involving the dominant mid right V4 segment (series 7, image 150). Right PICA patent. Left vertebral artery patent as it courses into the cranial vault. Left PICA patent. Severe atheromatous irregularity throughout the left V4 segment distal to the takeoff of the left PICA with associated severe multifocal stenoses. The left vertebral artery is markedly attenuated but remains grossly patent to the vertebrobasilar junction. 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 Basilar irregular but patent to its distal aspect without high-grade stenosis. Superior cerebellar arteries patent bilaterally. Right PCA supplied via the basilar as well as a prominent right posterior periphery. Predominant fetal type origin of the left PCA. Both PCAs patent to their distal aspects without high-grade stenosis. Venous sinuses: Grossly patent allowing for timing the  contrast bolus. Anatomic variants: As above. No other vascular abnormality seen underlying the left cerebellar hemorrhage. Review of the MIP images confirms the above findings IMPRESSION: 1. Negative CTA for large vessel occlusion. 2. Bulky calcified plaque about the left carotid bulb/proximal cervical left ICA with associated stenosis of up to 75% by NASCET criteria. 3. Moderate approximate 50% stenosis at the origin of the left vertebral artery. 4. Severe atheromatous irregularity throughout the left V4 segment, with associated severe multifocal stenoses. 5. 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 Finding is suspected to be incidental in nature, and unlikely to be the source of the left cerebellar hemorrhage. No other vascular abnormality seen underlying the left cerebellar bleed. 6. Moderate atheromatous stenosis involving the dominant mid right V4 segment. 7. Patchy consolidative left upper lobe opacity, suspicious for pneumonia. 8. Emphysema (ICD10-J43.9). Electronically Signed   By: Jeannine Boga M.D.   On: 06/12/2021 01:10  ? ?CT HEAD WO CONTRAST (5MM) ? ?Result Date: 06/11/2021 ?CLINICAL DATA:  Follow-up examination for subdural hemorrhage. EXAM: CT HEAD WITHOUT CONTRAST TECHNIQUE: Contiguous axial images were obtained from the base of the skull through the vertex without intravenous contrast. RADIATION DOSE REDUCTION: This exam was performed according to the departmental dose-optimization program which includes automated exposure control, adjustment of the mA and/or kV according to patient size and/or use of iterative reconstruction technique. COMPARISON:  Prior CT performed earlier the same day. FINDINGS:  Brain: Previously identified intraparenchymal hemorrhage positioned at the left cerebellum again seen, not significantly changed in size or morphology as compared to previous. Surrounding vasogenic edema with regional mass effect is also similar. Similar mild effacement of the  fourth ventricle with localized basilar cistern effacement. Ventricular size and morphology elsewhere is stable without hydrocephalus. No new hemorrhage elsewhere within the brain. No other acute large vessel te

## 2021-06-12 NOTE — Plan of Care (Signed)
?  Problem: Education: ?Goal: Knowledge of General Education information will improve ?Description: Including pain rating scale, medication(s)/side effects and non-pharmacologic comfort measures ?Outcome: Progressing ?  ?Problem: Health Behavior/Discharge Planning: ?Goal: Ability to manage health-related needs will improve ?Outcome: Progressing ?  ?Problem: Clinical Measurements: ?Goal: Ability to maintain clinical measurements within normal limits will improve ?Outcome: Progressing ?Goal: Will remain free from infection ?Outcome: Progressing ?Goal: Diagnostic test results will improve ?Outcome: Progressing ?Goal: Respiratory complications will improve ?Outcome: Progressing ?Goal: Cardiovascular complication will be avoided ?Outcome: Progressing ?  ?Problem: Activity: ?Goal: Risk for activity intolerance will decrease ?Outcome: Progressing ?  ?Problem: Nutrition: ?Goal: Adequate nutrition will be maintained ?Outcome: Progressing ?  ?Problem: Coping: ?Goal: Level of anxiety will decrease ?Outcome: Progressing ?  ?Problem: Elimination: ?Goal: Will not experience complications related to bowel motility ?Outcome: Progressing ?Goal: Will not experience complications related to urinary retention ?Outcome: Progressing ?  ?Problem: Pain Managment: ?Goal: General experience of comfort will improve ?Outcome: Progressing ?  ?Problem: Safety: ?Goal: Ability to remain free from injury will improve ?Outcome: Progressing ?  ?Problem: Skin Integrity: ?Goal: Risk for impaired skin integrity will decrease ?Outcome: Progressing ?  ?Problem: Education: ?Goal: Knowledge of disease or condition will improve ?Outcome: Progressing ?Goal: Knowledge of secondary prevention will improve (SELECT ALL) ?Outcome: Progressing ?Goal: Knowledge of patient specific risk factors will improve (INDIVIDUALIZE FOR PATIENT) ?Outcome: Progressing ?Goal: Individualized Educational Video(s) ?Outcome: Progressing ?  ?Problem: Coping: ?Goal: Will verbalize  positive feelings about self ?Outcome: Progressing ?Goal: Will identify appropriate support needs ?Outcome: Progressing ?  ?Problem: Health Behavior/Discharge Planning: ?Goal: Ability to manage health-related needs will improve ?Outcome: Progressing ?  ?Problem: Self-Care: ?Goal: Ability to participate in self-care as condition permits will improve ?Outcome: Progressing ?Goal: Verbalization of feelings and concerns over difficulty with self-care will improve ?Outcome: Progressing ?Goal: Ability to communicate needs accurately will improve ?Outcome: Progressing ?  ?Problem: Nutrition: ?Goal: Risk of aspiration will decrease ?Outcome: Progressing ?Goal: Dietary intake will improve ?Outcome: Progressing ?  ?Problem: Intracerebral Hemorrhage Tissue Perfusion: ?Goal: Complications of Intracerebral Hemorrhage will be minimized ?Outcome: Progressing ?  ?

## 2021-06-13 ENCOUNTER — Inpatient Hospital Stay (HOSPITAL_COMMUNITY): Payer: PPO

## 2021-06-13 DIAGNOSIS — I10 Essential (primary) hypertension: Secondary | ICD-10-CM

## 2021-06-13 DIAGNOSIS — I614 Nontraumatic intracerebral hemorrhage in cerebellum: Secondary | ICD-10-CM | POA: Diagnosis not present

## 2021-06-13 DIAGNOSIS — E860 Dehydration: Secondary | ICD-10-CM | POA: Diagnosis not present

## 2021-06-13 DIAGNOSIS — J189 Pneumonia, unspecified organism: Secondary | ICD-10-CM | POA: Diagnosis not present

## 2021-06-13 DIAGNOSIS — R918 Other nonspecific abnormal finding of lung field: Secondary | ICD-10-CM | POA: Diagnosis not present

## 2021-06-13 DIAGNOSIS — R519 Headache, unspecified: Secondary | ICD-10-CM | POA: Diagnosis present

## 2021-06-13 DIAGNOSIS — I619 Nontraumatic intracerebral hemorrhage, unspecified: Secondary | ICD-10-CM | POA: Diagnosis not present

## 2021-06-13 LAB — CBC WITH DIFFERENTIAL/PLATELET
Abs Immature Granulocytes: 0.04 10*3/uL (ref 0.00–0.07)
Basophils Absolute: 0 10*3/uL (ref 0.0–0.1)
Basophils Relative: 0 %
Eosinophils Absolute: 0.1 10*3/uL (ref 0.0–0.5)
Eosinophils Relative: 2 %
HCT: 44.2 % (ref 39.0–52.0)
Hemoglobin: 14.7 g/dL (ref 13.0–17.0)
Immature Granulocytes: 0 %
Lymphocytes Relative: 18 %
Lymphs Abs: 1.6 10*3/uL (ref 0.7–4.0)
MCH: 28.4 pg (ref 26.0–34.0)
MCHC: 33.3 g/dL (ref 30.0–36.0)
MCV: 85.5 fL (ref 80.0–100.0)
Monocytes Absolute: 0.8 10*3/uL (ref 0.1–1.0)
Monocytes Relative: 9 %
Neutro Abs: 6.3 10*3/uL (ref 1.7–7.7)
Neutrophils Relative %: 71 %
Platelets: 281 10*3/uL (ref 150–400)
RBC: 5.17 MIL/uL (ref 4.22–5.81)
RDW: 12.9 % (ref 11.5–15.5)
WBC: 8.9 10*3/uL (ref 4.0–10.5)
nRBC: 0 % (ref 0.0–0.2)

## 2021-06-13 LAB — GLUCOSE, CAPILLARY
Glucose-Capillary: 187 mg/dL — ABNORMAL HIGH (ref 70–99)
Glucose-Capillary: 215 mg/dL — ABNORMAL HIGH (ref 70–99)
Glucose-Capillary: 184 mg/dL — ABNORMAL HIGH (ref 70–99)
Glucose-Capillary: 239 mg/dL — ABNORMAL HIGH (ref 70–99)

## 2021-06-13 LAB — EXPECTORATED SPUTUM ASSESSMENT W GRAM STAIN, RFLX TO RESP C

## 2021-06-13 LAB — BASIC METABOLIC PANEL WITH GFR
Anion gap: 9 (ref 5–15)
BUN: 16 mg/dL (ref 8–23)
CO2: 22 mmol/L (ref 22–32)
Calcium: 9.1 mg/dL (ref 8.9–10.3)
Chloride: 101 mmol/L (ref 98–111)
Creatinine, Ser: 0.66 mg/dL (ref 0.61–1.24)
GFR, Estimated: >60 mL/min
Glucose, Bld: 171 mg/dL — ABNORMAL HIGH (ref 70–99)
Potassium: 3.8 mmol/L (ref 3.5–5.1)
Sodium: 132 mmol/L — ABNORMAL LOW (ref 135–145)

## 2021-06-13 LAB — MAGNESIUM: Magnesium: 2 mg/dL (ref 1.7–2.4)

## 2021-06-13 MED ORDER — METHYLPREDNISOLONE 4 MG PO TBPK: 8.0000 mg | ORAL_TABLET | Freq: Every evening | ORAL | Status: AC

## 2021-06-13 MED ORDER — METHYLPREDNISOLONE 4 MG PO TBPK
4.0000 mg | ORAL_TABLET | Freq: Four times a day (QID) | ORAL | Status: DC
  Administered 2021-06-15 – 2021-06-18 (×10): 4 mg via ORAL

## 2021-06-13 MED ORDER — IOHEXOL 300 MG/ML  SOLN
75.0000 mL | Freq: Once | INTRAMUSCULAR | Status: AC | PRN
  Administered 2021-06-13: 75 mL via INTRAVENOUS

## 2021-06-13 MED ORDER — METHYLPREDNISOLONE 4 MG PO TBPK
4.0000 mg | ORAL_TABLET | ORAL | Status: AC
Start: 2021-06-13 — End: 2021-06-14

## 2021-06-13 MED ORDER — METHYLPREDNISOLONE 4 MG PO TBPK
8.0000 mg | ORAL_TABLET | Freq: Every evening | ORAL | Status: AC
  Administered 2021-06-13: 8 mg via ORAL

## 2021-06-13 MED ORDER — INSULIN GLARGINE-YFGN 100 UNIT/ML ~~LOC~~ SOLN
4.0000 [IU] | Freq: Once | SUBCUTANEOUS | Status: AC
  Administered 2021-06-13: 4 [IU] via SUBCUTANEOUS
  Filled 2021-06-13: qty 0.04

## 2021-06-13 MED ORDER — METHYLPREDNISOLONE 4 MG PO TBPK
4.0000 mg | ORAL_TABLET | Freq: Three times a day (TID) | ORAL | Status: AC
  Administered 2021-06-14 (×2): 4 mg via ORAL
  Filled 2021-06-13: qty 21

## 2021-06-13 MED ORDER — INSULIN GLARGINE-YFGN 100 UNIT/ML ~~LOC~~ SOLN
14.0000 [IU] | Freq: Every day | SUBCUTANEOUS | Status: DC
  Administered 2021-06-14 – 2021-06-15 (×2): 14 [IU] via SUBCUTANEOUS
  Filled 2021-06-13 (×2): qty 0.14

## 2021-06-13 MED ORDER — METHYLPREDNISOLONE 4 MG PO TBPK
4.0000 mg | ORAL_TABLET | ORAL | Status: AC
  Administered 2021-06-13: 4 mg via ORAL

## 2021-06-13 MED ORDER — METHYLPREDNISOLONE 4 MG PO TBPK
8.0000 mg | ORAL_TABLET | Freq: Every morning | ORAL | Status: AC
Start: 2021-06-13 — End: 2021-06-14
  Filled 2021-06-13: qty 21

## 2021-06-13 NOTE — Progress Notes (Signed)
STROKE TEAM PROGRESS NOTE  ? ?INTERVAL HISTORY ?His wife is at the bedside.  Patient continues to complain of headache.  This has not responded to Tylenol and tramadol.  He had CT scan of the chest today which showed 4.3 x 3.6 x 3.3 cm lobulated spiculated mass in the posterior left upper lobe with tethering to the lateral major fissure along with metastatic left hilar adenopathy highly suggestive of primary lung cancer.  MRI done yesterday showed parenchymal hematoma without any underlying tumor or metastasis.  No abnormal enhancement. ?Vitals:  ? 06/13/21 0019 06/13/21 0459 06/13/21 0819 06/13/21 0953  ?BP: 135/74 (!) 141/80 (!) 144/82 (!) 163/81  ?Pulse: 80 80 78 80  ?Resp: 20 (!) 22  18  ?Temp: 97.8 ?F (36.6 ?C) 98.3 ?F (36.8 ?C)  98.2 ?F (36.8 ?C)  ?TempSrc: Oral Oral Oral Oral  ?SpO2: 97% 95% 95% 96%  ?Weight:      ?Height:      ? ?CBC:  ?Recent Labs  ?Lab 06/12/21 ?0925 06/13/21 ?0311  ?WBC 9.6 8.9  ?NEUTROABS 7.3 6.3  ?HGB 15.1 14.7  ?HCT 46.7 44.2  ?MCV 86.8 85.5  ?PLT 343 281  ? ?Basic Metabolic Panel:  ?Recent Labs  ?Lab 06/11/21 ?0118 06/12/21 ?7846 06/13/21 ?0311  ?NA 132* 134* 132*  ?K 4.2 4.2 3.8  ?CL 97* 99 101  ?CO2 22 24 22   ?GLUCOSE 310* 225* 171*  ?BUN 32* 17 16  ?CREATININE 0.88 0.86 0.66  ?CALCIUM 9.6 9.5 9.1  ?MG 2.3 2.0 2.0  ?PHOS 4.2  --   --   ? ?Lipid Panel:  ?Recent Labs  ?Lab 06/12/21 ?9629  ?CHOL 266*  ?TRIG 144  ?HDL 24*  ?CHOLHDL 11.1  ?VLDL 29  ?[place] 213*  ? ?HgbA1c:  ?Recent Labs  ?Lab 06/10/21 ?1210  ?HGBA1C 13.6*  ? ?Urine Drug Screen:  ?Recent Labs  ?Lab 06/11/21 ?0239  ?LABOPIA NONE DETECTED  ?COCAINSCRNUR NONE DETECTED  ?LABBENZ NONE DETECTED  ?AMPHETMU NONE DETECTED  ?THCU NONE DETECTED  ?LABBARB NONE DETECTED  ?  ?Alcohol Level  ?Recent Labs  ?Lab 06/11/21 ?0118  ?ETH <10  ? ? ?IMAGING past 24 hours ?CT CHEST W CONTRAST ? ?Result Date: 06/13/2021 ?CLINICAL DATA:  Pulmonary lesion on recent chest x-ray. EXAM: CT CHEST WITH CONTRAST TECHNIQUE: Multidetector CT imaging of the  chest was performed during intravenous contrast administration. RADIATION DOSE REDUCTION: This exam was performed according to the departmental dose-optimization program which includes automated exposure control, adjustment of the mA and/or kV according to patient size and/or use of iterative reconstruction technique. CONTRAST:  50mL OMNIPAQUE IOHEXOL 300 MG/ML  SOLN COMPARISON:  Chest x-ray 06/12/2021 FINDINGS: Cardiovascular: The heart size is normal. No substantial pericardial effusion. Coronary artery calcification is evident. Mild atherosclerotic calcification is noted in the wall of the thoracic aorta. Mediastinum/Nodes: No mediastinal lymphadenopathy. 14 mm short axis left hilar node visible on 58/3. A second enlarged potentially necrotic left hilar node is seen on 68/3 measuring 18 mm short axis. The esophagus has normal imaging features. There is no axillary lymphadenopathy. Lungs/Pleura: 4.3 x 3.6 x 3.3 cm lobular spiculated mass is identified in the posterior left upper lobe with tethering to the lateral pleura and major fissure. There is some ground-glass opacity peripheral to the lesion, potentially postobstructive etiology. Peripheral micro nodularity noted posterior right costophrenic sulcus (114/5), likely related to infectious/inflammatory etiology and/or aspiration. No pleural effusion. Upper Abdomen: Unremarkable. Musculoskeletal: No worrisome lytic or sclerotic osseous abnormality. IMPRESSION: 1. 4.3 x 3.6 x 3.3  cm lobular spiculated mass in the posterior left upper lobe with tethering to the lateral pleura and major fissure. Imaging features are consistent with primary bronchogenic neoplasm. 2. Metastatic left hilar lymphadenopathy. 3. Peripheral micro nodularity posterior right costophrenic sulcus, likely related to infectious/inflammatory etiology and/or aspiration. 4. Aortic Atherosclerosis (ICD10-I70.0). Electronically Signed   By: Misty Stanley M.D.   On: 06/13/2021 09:54   ? ?PHYSICAL  EXAM ? ?Physical Exam  ?Constitutional: Appears well-developed and well-nourished.  Elderly male not in distress ?Cardiovascular: Normal rate and regular rhythm.  ?Respiratory: Effort normal, non-labored breathing ? ?Neuro: ?Mental Status: ?Patient is awake, alert, oriented to person, place, month, year, and situation. ?Patient is able to give a clear and coherent history. ?No signs of aphasia or neglect ?Cranial Nerves: ?II: Visual Fields are full. Pupils are equal, round, and reactive to light.  Saccadic dysmettria to the left only but no nystagmus ?III,IV, VI: EOMI without ptosis or diploplia.  ?V: Facial sensation is symmetric to temperature ?VII: Facial movement is symmetric resting and smiling ?VIII: Hearing is intact to voice ?X: Palate elevates symmetrically ?XI: Shoulder shrug is symmetric. ?XII: Tongue protrudes midline without atrophy or fasciculations.  ?Motor: ?Tone is normal. Bulk is normal. 5/5 strength was present in all four extremities.  ?Sensory: ?Sensation is symmetric to light touch in the arms and legs. ?Cerebellar: ?FNF and intact bilaterally ? ? ?ASSESSMENT/PLAN ?Micheal Donovan is a 71 y.o. male with history of  uncontrolled diabetes, hypertension, hyperlipidemia, retinopathy from diabetes, based on carotid ultrasound 2014-bilateral ICA stenosis 60 to 79%, presented to the emergency room at [facility] for evaluation of a headache that had been going on for 1 week.  He reports that he took some aspirin for his headache and that his blood sugar and blood pressure have been abnormally high this week.  MRI shows no significant change in the size of the IPH, unchanged regional mass effect.  Carotid Dopplers are pending ? ?Stroke:  IPH involving the left cerebellum infarct likely secondary ischemic with HT given high risk factors for ischemic infarct and normotensive BP ?Code Stroke CT head Acute to subacute IPH involving the left cerebellum (estimated volume 19 mL). Surrounding  low-density vasogenic edema with mild regional mass effect. Partial effacement of the fourth ventricle which remains patent at this time. No hydrocephalus or transtentorial herniation. ?CTA head & neck- No LVO, Bulky calcified plaque about the left carotid bulb/proximal cervical left ICA with associated stenosis of up to 75%.  ?MRI  No significant interval change in size of the left cerebellar intraparenchymal hematoma with unchanged regional mass effect and partial effacement of fourth ventricle but no upstream hydrocephalus.  ?Carotid Doppler pending ?2D Echo EF 29-92%, grade 1 diastolic dysfunction ?LDL 213 ?HgbA1c 13.6 ?VTE prophylaxis -SCDs ?   ?Diet  ? Diet heart healthy/carb modified Room service appropriate? Yes; Fluid consistency: Thin  ? ?aspirin 81 mg daily prior to admission, now on No antithrombotic.  ?Therapy recommendations:  pending ?Disposition:  pending ? ?Vasogenic cerebral edema ?Stable on MRI, no upstream hydrocephalus ? ?Hypertension ?Home meds:  None ?Stable, BP goal less than 160 ?Long-term BP goal normotensive ? ?Hyperlipidemia ?Home meds:  None ?LDL 213, goal < 70 ?Add Atorvastatin 40mg   ?Continue statin at discharge ? ?Diabetes type II Uncontrolled ?Home meds: Insulin ?HgbA1c 13.6, goal < 7.0 ?CBGs ?Recent Labs  ?  06/12/21 ?2109 06/13/21 ?0643 06/13/21 ?1120  ?GLUCAP 190* 187* 215*  ?  ?SSI ? ?Other Stroke Risk Factors ?Advanced Age >/=  45  ?Former cigarette smoker ?ETOH use, alcohol level <10, advised to drink no more than 2 drink(s) a day ? ?Other Active Problems ?Elevated alkaline phosphatase ?128 ?Leukocytosis-improved ?WBC 12.5-> 9.6 ?Elevated H/H 18.1/55.1-> 16.9/50.7 ? ?Hospital day # 2 ? ? Presented with 1 day history of headache for which he took multiple doses of aspirin and ibuprofen alternatingly and CT scan shows large left cerebellar parenchymal hematoma with mild mass effect on the fourth ventricle with no significant hydrocephalus.  Patient has history of hypertension but  blood pressure not significantly elevated and he did not require any parenteral blood pressure medicines raising question whether he had a ischemic stroke with secondary hemorrhagic conversion MRI but does not s

## 2021-06-13 NOTE — Progress Notes (Addendum)
Inpatient Diabetes Program Recommendations ? ?AACE/ADA: New Consensus Statement on Inpatient Glycemic Control  ? ?Target Ranges:  Prepandial:   less than 140 mg/dL ?     Peak postprandial:   less than 180 mg/dL (1-2 hours) ?     Critically ill patients:  140 - 180 mg/dL  ? ? Latest Reference Range & Units 06/12/21 06:47 06/12/21 13:13 06/12/21 16:04 06/12/21 21:09 06/13/21 06:43 06/13/21 11:20  ?Glucose-Capillary 70 - 99 mg/dL 193 (H) 183 (H) 179 (H) 190 (H) 187 (H) 215 (H)  ? ?Review of Glycemic Control ? ?Diabetes history: DM2 ?Outpatient Diabetes medications: Trulicity 1.5 mg Qweek (never started), Lantus 28 units daily (ran out in January 2023), Metformin 500 mg BID (not taking due to GI intolerance) ?Current orders for Inpatient glycemic control: Semglee 10 units daily, Novolog 0-15 units TID with meals, Novolog 0-5 units QHS; Medrol dose pack ? ?Inpatient Diabetes Program Recommendations:   ? ?Insulin: If steroids are continued, please consider increasing Semglee to 12 units daily and ordering Novolog 3 units TID with meals for meal coverage if patient eats at least 50% of meals. ? ?HbgA1C: A1C 13.6% on 06/10/21 indicating an average glucose of 344 mg/dl over the past 2-3 months. At time of discharge please provide Rx for Lantus and any other DM medications prescribed at discharge. Patient does not want to take Metformin due to GI intolerance. ?  ?NOTE: Per H&P, presents to the emergency department due to 1 week onset of generalized weakness, dizziness and headache in the posterior part of head.  Patient reported that he ran out of his diabetes and blood pressure medications since January. Patient admitted with Subacute intraparenchymal hemorrhage of brain. Spoke with patient at bedside regarding DM control. Patient states he still feels bad, has headache and nausea. RN came in room and was going to get medication for patient's headache and nausea. Patient reports that he ran out of DM medications in January  because he needed refills and needed to see doctor before they were refilled. Patient states that he never started or filled Trulicity. Patient reports he was taking Lantus 28 units daily but he did not like to take the Metformin due to GI intolerance. Patient states he has not checked glucose lately because he was out of test strips. Patient reports that he does not remember what his glucose was running when he was taking the Lantus consistently.  Inquired about prior A1C and patient reports not being able to recall last A1C value. Discussed A1C results (13.6% on 06/10/21) and explained that current A1C indicates an average glucose of 344 mg/dl over the past 2-3 months. Discussed glucose and A1C goals. Discussed importance of checking CBGs and maintaining good CBG control to prevent long-term and short-term complications.  Stressed to the patient the importance of improving glycemic control to prevent further complications from uncontrolled diabetes. Patient verbalized understanding of information discussed and reports no further questions at this time related to diabetes. ? ?Thanks, ?Barnie Alderman, RN, MSN, CDE ?Diabetes Coordinator ?Inpatient Diabetes Program ?[phone] (Team Pager from 8am to 5pm) ? ? ?

## 2021-06-13 NOTE — Progress Notes (Signed)
?PROGRESS NOTE ? ? ? Micheal Donovan  JKK:[id number] DOB: Oct 30, 1950 DOA: 06/10/2021 ?PCP: [name], MD ? ? ?Chief Complaint  ?Patient presents with  ? Hyperglycemia  ? ? ?Brief Narrative:  ?Micheal Donovan is a 71 y.o. male with medical history significant of T2DM, hypertension who presents to the emergency department due to 1 week onset of generalized weakness, dizziness and headache in the posterior part of head.   ?  ?Patient indicates he has been out of home blood pressure and diabetes medications for months, presented to PCP with notable hyperglycemia, hypertension and profound dehydration and subsequently sent to the hospital for further evaluation and treatment. ?  ?Unfortunately in the ED patient's hyperglycemia was profound requiring insulin drip overnight, patient's headache was followed up with CT head noncontrast which was remarkable for acute to subacute intraparenchymal hemorrhage of the left cerebellum 3.3 x 2.7 x 4.3 cm around 19 mL.  Given these acute findings patient's blood pressure was aggressively controlled, patient is transferred to [place] for closer neurology and neurosurgical follow-up. ? ? ?Assessment & Plan: ?  ?Principal Problem: ?  Intraparenchymal hemorrhage of brain ([place]) ?Active Problems: ?  Essential hypertension ?  Type 2 diabetes mellitus with hyperglycemia (HCC) ?  Pseudohyponatremia ?  Dehydration ?  Leukocytosis ?  Thrombocytosis ?  Hypercalcemia ?  Hyperlipidemia ?  CAP (community acquired pneumonia) ?  Cerebellar bleed ([place]) ?  Pulmonary mass ?  Headache ? ?#1 subacute intraparenchymal hemorrhage of the brain versus hemorrhagic transformation of ischemic stroke/headache ?-Patient noted to have presented with elevated blood sugar levels, noted to have some dizziness and gait ataxia not improving over the past week. ?-CT head done concerning for subacute intraparenchymal hemorrhage involving the left cerebellum of about 19 cc with surrounding low-density  vasogenic edema and mild regional mass effect and partial effacement of fourth ventricle which was patent without evidence of hydrocephalus or herniation. ?-MRI brain done with no significant interval change in size of left cerebellar intraparenchymal hematoma with unchanged regional mass effect and partial effacement of fourth ventricle but no upstream hydrocephalus. ?-2D echo EF 55 to 71%,IRCV, grade 1 diastolic dysfunction, normal right ventricular systolic function, no source of emboli noted. 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  Findings suspected to be incidental in nature and unlikely to be source of left cerebellar hemorrhage.  No other vascular abnormality seen underlying left cerebellar bleed.  Moderate atheromatous stenosis involving the dominant mid right V4 segment.  Patchy consolidative left upper lobe opacity suspicious for pneumonia.  Emphysema. ?-CT chest with concern for primary lung carcinoma. ?-LDL 213, hemoglobin A1c 13.6. ?-Start Lipitor. ?-BP control with goal systolic blood pressure < 160. ?-PT/OT/ST. ?-Neurology following and I appreciate their input and recommendations. ? ?2.  DKA versus honk/anion gap metabolic acidosis in the setting of uncontrolled diabetes mellitus/hyperglycemia ?-On admission at [facility] patient was placed on the Endo tool/insulin drip and subsequently transitioned off insulin drip. ?-Hemoglobin A1c 13.6. ?-CBG 187 this morning. ?-Increase Semglee to 14 units daily.  ?-SSI. ?-Diabetes coordinator following. ? ?3.  Pulmonary mass noted on chest x-ray +/- pneumonia ?-Chest x-ray done  with a  4.4 cm left midlung pulmonary mass concerning for malignancy. ?-CT angiogram head and neck done with some concern for patchy consolidative left upper lobe opacity suspicious for pneumonia. ?-CT chest done this morning 06/13/2021 with a 4.3 x 3.6 x 3.3 cm lobular spiculated mass in the posterior left upper lobe with teetering to the lateral pleural and major fissure with imaging findings consistent with primary bronchogenic neoplasm.  Metastatic left hilar lymphadenopathy.  Peripheral micronodularity posterior right costophrenic sulcus, likely related to infectious/inflammatory etiology and/or aspiration. ?-Urine Legionella antigen pending. ?-Urine strep pneumococcus antigen negative. ?-Continue empiric IV Rocephin and azithromycin and treat for total of 5 to 7 days. ?-Consulted pulmonary for further evaluation and management. ?-Supportive care. ? ?4.  Medication noncompliance ?-Dr. Avon Gully discussed with patient at bedside need for medication compliance given his hypertension, concurrent intraparenchymal hemorrhage in DKA. ? ?5.  Pseudohyponatremia ?-Improved with correction of hyperglycemia. ? ?6.  Hypertension ?-Continue current regimen of Norvasc, Cozaar. ? ?7.  Hyperglycemia ?-Resolved. ? ?8.  Dehydration ?-Improved with hydration. ? ?9.  Hemoconcentration with notable leukocytosis, thrombocytosis and polycythemia ?Improved with hydration. ? ?10.  Headaches ?-Patient with complaints of headaches despite narcotics.  CT chest with concern for primary lung carcinoma.  Patient started on Medrol Dosepak per neurology to see if it helps with his headaches ? ? ?DVT prophylaxis: SCDs ?Code Status: Full ?Family Communication: No family at bedside. ?Disposition: TBD ? ?Status is: Inpatient ?Remains inpatient appropriate because: Severity of illness ?  ?Consultants:  ?Neurology: Dr. Rory Percy 06/11/2021 ?Pulmonary pending ? ?Procedures:  ?CT head 06/11/2021 ?CT angiogram head and neck 06/12/2021 ?Chest x-ray  06/12/2021 ?MRI brain 06/12/2021 ?2D echo 06/12/2021 ?CT chest 06/13/2021 ? ? ?Antimicrobials:  ?Anti-infectives (From admission, onward)  ? ? Start     Dose/Rate Route Frequency Ordered Stop  ? 06/12/21 1030  cefTRIAXone (ROCEPHIN) 2 g in sodium chloride 0.9 % 100 mL IVPB       ? 2 g ?200 mL/hr over 30 Minutes Intravenous Every 24 hours 06/12/21 0930 06/17/21 1029  ? 06/12/21 1030  azithromycin (ZITHROMAX) 500 mg in sodium chloride 0.9 % 250 mL IVPB       ? 500 mg ?250 mL/hr over 60 Minutes Intravenous Every 24 hours 06/12/21 0930 06/17/21 1029  ? ?  ?  ? ? ?Subjective: ?Patient sitting up in chair.  No chest pain.  No shortness of breath.  Some complaints of headache, some occasional dizziness, and some intermittent nausea.  Tolerating diet.  Just worked with physical therapy.  Wife at bedside somewhat felt shocked.  Wife states neurologist feels what was noted on MRI was due to possible metastatic disease.   ? ?Objective: ?Vitals:  ? 06/13/21 0819 06/13/21 0953 06/13/21 1200 06/13/21 1609  ?BP: (!) 144/82 (!) 163/81  (!) 159/86  ?Pulse: 78 80  88  ?Resp:  18  18  ?Temp:  98.2 ?F (36.8 ?C)    ?TempSrc: Oral Oral    ?SpO2: 95% 96% 95% 94%  ?Weight:      ?Height:      ? ? ?Intake/Output Summary (Last 24 hours) at 06/13/2021 1732 ?Last data filed at 06/13/2021 0000 ?Gross per 24 hour  ?Intake 240 ml  ?Output --  ?Net 240 ml  ? ?Filed Weights  ? 06/10/21 2115  ?Weight: 67.2 kg  ? ? ?Examination: ? ?General exam: NAD ?Respiratory system: Lungs clear to auscultation bilaterally.  No wheezes, no crackles, no rhonchi.  Normal respiratory effort. ?Cardiovascular system: RRR no murmurs rubs or gallops.  No JVD.  No lower extremity edema. ?Gastrointestinal system: Abdomen is nondistended, soft and nontender. No organomegaly or masses felt. Normal bowel sounds heard. ?Central nervous system: Alert and oriented to self place and time.  Moving extremities spontane ?Extremities: Symmetric 5 x 5 power. ?Skin: No rashes, lesions or  ulcers ?Psychiatry: Judgement and insight fair to normal.  Mood appropriate. ? ? ? ?Data Reviewed: I have personally reviewed following labs and imaging studies ? ?CBC: ?Recent Labs  ?Lab 06/10/21 ?1210 06/10/21 ?2141 04

## 2021-06-13 NOTE — Consult Note (Signed)
? ?NAME:  Micheal Donovan, MRN:  [id number], DOB:  [birth year], LOS: 2 ?ADMISSION DATE:  06/10/2021, CONSULTATION DATE:  06/13/2021 ?REFERRING MD:  Dr. Grandville Silos, CHIEF COMPLAINT:  weakness/HA/ dizziness  ? ?History of Present Illness:  ? ?71 year old male with prior history of hypertension, HLD and uncontrolled type 2 diabetes who presented on 4/10 with complaints of one week history of weakness, gait ataxia, dizziness, and posterior headache.  Reportedly out of medications for months.  Thought he had a sinus infection therefore went to his PCP and found to be dehydrated, hyperglycemic, and hypertensive, therefore he sent to ER.  On evaluation, found to have acute to subacute intraparenchymal hemorrhage of the left cerebellum with surrounding vasogenic edema with mild regional mass effect and partial effacement of the fourth ventricle.  He had normal blood pressures on admit.  He was admitted to [facility] and placed on insulin gtt for mild DKA with Neurology consulting for IPH.  Additionally found on CXR to have 4.4 cm left mid lung pulmonary mass concerning for malignancy.  Underwent chest CT with contrast for further evaluation which showed a 4.3 x 3.6 x 3.3 cm lobular spiculated mass in the posterior left upper lobe with tethering to the lateral pleura and major fissure with metastatic left hilar lymphadenopathy.  Pulmonary consulted for further evaluation. ? ?Patient reports 30lb unintentional weight loss over the last 3 months.  Reports new non-productive cough for several weeks but denies any sputum production or hemoptysis.  Denies any SOB.  He is retired from working in a cigarette factory.  Former smoker, quit in 1990.  Smoked for 25 years, 1 pack per day at his heaviness.  ? ?Pertinent  Medical History  ?HTN, DMT2, HLD, BPH, melanoma, ED, diabetic retinopathy ?Former smoker- quit   ? ?Significant Hospital Events: ?Including procedures, antibiotic start and stop dates in addition to other pertinent events   ?4/11  admitted to [facility], neurology consulting ?4/13 Pulmonary consulted ? ?4/13 CT chest w/contrast >> ?1. 4.3 x 3.6 x 3.3 cm lobular spiculated mass in the posterior left upper lobe with tethering to the lateral pleura and major fissure.  Imaging features are consistent with primary bronchogenic neoplasm. ?2. Metastatic left hilar lymphadenopathy. ?3. Peripheral micro nodularity posterior right costophrenic sulcus, likely related to infectious/inflammatory etiology and/or aspiration. ?4. Aortic Atherosclerosis  ? ?Interim History / Subjective:  ?Currently c/o of nausea and headache ? ?Objective   ?Blood pressure (!) 163/81, pulse 80, temperature 98.2 ?F (36.8 ?C), temperature source Oral, resp. rate 18, height 5\' 7"  (1.702 m), weight 67.2 kg, SpO2 96 %. ?   ?   ? ?Intake/Output Summary (Last 24 hours) at 06/13/2021 1249 ?Last data filed at 06/13/2021 0000 ?Gross per 24 hour  ?Intake 589.98 ml  ?Output --  ?Net 589.98 ml  ? ?Filed Weights  ? 06/10/21 2115  ?Weight: 67.2 kg  ? ? ?Examination: ?General:  Adult male sitting in bedside recliner in no distress- but appears tired/ fatigued wanting to get back into bed.   ?HEENT: MM pink/moist ?Neuro:  Keeps eyes closed during conversation, but oriented to person, place, month and year.  MAE- no appreciated weakness, speech clear ?CV: rr ?PULM:  non labored, clear, no wheeze ?GI: soft,+bs ?Extremities: warm/dry, no LE edema  ? ? ?Resolved Hospital Problem list   ? ?Assessment & Plan:  ? ?Left upper lobe spiculated mass with left hilar lymphadenopathy ?Former smoker ?- 4.3 x 3.6 x 3.3 cm in left posterior upper lobe ?- given smoking  hx, mass characteristics, and recent unintentional 30 lb weight loss, highly concerning for primary lung cancer ?P:  ?- will ultimately need bronchoscopy for tissue biopsy.  Likely left hilar node can likely be biopsied with EBUS vs going after spiculated mass itself.  However, we will need clearance from Neurology prior to moving forward given his  subacute intraparenchymal hemorrhage as he will need general anesthesia.    ? ? ?Remainder per primary team.  Pulmonary will continue to follow.   ?  ?Subacute intraparenchymal hemorrhage involving the left cerebellum ?- MRI 4/12  no significant interval change in size from prior CT.  Did not reveal any underlying tumor or metastasis.  Unclear etiology at this time, possibly felt to be secondary ischemic infarct with hemorraghic transformation.  Consider repeat imaging in 2-3 months.  ?- per Neurology ? ? ?Best Practice (right click and "Reselect all SmartList Selections" daily)  ? ?Diet/type: Regular consistency (see orders) ?DVT prophylaxis: SCD ?GI prophylaxis: N/A ?Lines: N/A ?Foley:  N/A ?Code Status:  full code ?Last date of multidisciplinary goals of care discussion [per primary team] ? ?Labs   ?CBC: ?Recent Labs  ?Lab 06/10/21 ?1210 06/10/21 ?2141 06/11/21 ?0118 06/12/21 ?2774 06/13/21 ?1287  ?WBC 12.2* 12.5* 11.7* 9.6 8.9  ?NEUTROABS 10,260*  --   --  7.3 6.3  ?HGB 18.1* 17.4* 16.9 15.1 14.7  ?HCT 55.1* 52.6* 50.7 46.7 44.2  ?MCV 87.6 86.4 86.8 86.8 85.5  ?PLT 463* 471* 386 343 281  ? ? ?Basic Metabolic Panel: ?Recent Labs  ?Lab 06/10/21 ?1210 06/10/21 ?2141 06/11/21 ?0118 06/12/21 ?8676 06/13/21 ?7209  ?NA 130* 129* 132* 134* 132*  ?K 5.2 5.3* 4.2 4.2 3.8  ?CL 90* 92* 97* 99 101  ?CO2 22 20* 22 24 22   ?GLUCOSE 393* 365* 310* 225* 171*  ?BUN 31* 34* 32* 17 16  ?CREATININE 0.89 1.00 0.88 0.86 0.66  ?CALCIUM 10.8* 10.0 9.6 9.5 9.1  ?MG  --   --  2.3 2.0 2.0  ?PHOS  --   --  4.2  --   --   ? ?GFR: ?Estimated Creatinine Clearance: 80.3 mL/min (by C-G formula based on SCr of 0.66 mg/dL). ?Recent Labs  ?Lab 06/10/21 ?2141 06/11/21 ?0118 06/12/21 ?4709 06/13/21 ?6283  ?WBC 12.5* 11.7* 9.6 8.9  ? ? ?Liver Function Tests: ?Recent Labs  ?Lab 06/10/21 ?1210 06/11/21 ?0118 06/12/21 ?6629  ?AST 10 17 18   ?ALT 12 16 14   ?ALKPHOS  --  165* 128*  ?BILITOT 0.7 1.0 0.8  ?PROT 8.2* 8.2* 6.6  ?ALBUMIN  --  4.2 3.4*  ? ?No  results for input(s): LIPASE, AMYLASE in the last 168 hours. ?No results for input(s): AMMONIA in the last 168 hours. ? ?ABG ?No results found for: PHART, PCO2ART, PO2ART, HCO3, TCO2, ACIDBASEDEF, O2SAT  ? ?Coagulation Profile: ?Recent Labs  ?Lab 06/11/21 ?0118  ?INR 1.0  ? ? ?Cardiac Enzymes: ?No results for input(s): CKTOTAL, CKMB, CKMBINDEX, TROPONINI in the last 168 hours. ? ?HbA1C: ?Hgb A1c MFr Bld  ?Date/Time Value Ref Range Status  ?06/10/2021 12:10 PM 13.6 (H) <5.7 % of total Hgb Final  ?  Comment:  ?  For someone without known diabetes, a hemoglobin A1c ?value of 6.5% or greater indicates that they may have  ?diabetes and this should be confirmed with a follow-up  ?test. ?. ?For someone with known diabetes, a value <7% indicates  ?that their diabetes is well controlled and a value  ?greater than or equal to 7% indicates suboptimal  ?control.  A1c targets should be individualized based on  ?duration of diabetes, age, comorbid conditions, and  ?other considerations. ?. ?Currently, no consensus exists regarding use of ?hemoglobin A1c for diagnosis of diabetes for children. ?. ?  ?04/29/2017 08:22 AM 8.4 (H) <5.7 % of total Hgb Final  ?  Comment:  ?  For someone without known diabetes, a hemoglobin A1c ?value of 6.5% or greater indicates that they may have  ?diabetes and this should be confirmed with a follow-up  ?test. ?. ?For someone with known diabetes, a value <7% indicates  ?that their diabetes is well controlled and a value  ?greater than or equal to 7% indicates suboptimal  ?control. A1c targets should be individualized based on  ?duration of diabetes, age, comorbid conditions, and  ?other considerations. ?. ?Currently, no consensus exists regarding use of ?hemoglobin A1c for diagnosis of diabetes for children. ?. ?  ? ? ?CBG: ?Recent Labs  ?Lab 06/12/21 ?1313 06/12/21 ?1604 06/12/21 ?2109 06/13/21 ?6168 06/13/21 ?1120  ?GLUCAP 183* 179* 190* 187* 215*  ? ? ?Review of Systems:   ?Review of Systems   ?Constitutional:  Positive for weight loss. Negative for chills and fever.  ?HENT:  Positive for sinus pain.   ?Respiratory:  Positive for cough. Negative for hemoptysis, sputum production, shortness of breath and wheezin

## 2021-06-13 NOTE — Evaluation (Signed)
Speech Language Pathology Evaluation ?Patient Details ?Name: Micheal Donovan ?MRN: [id number] ?DOB: [birth year] ?Today's Date: 06/13/2021 ?Time: 1008-1030 ?SLP Time Calculation (min) (ACUTE ONLY): 22 min ? ?Problem List:  ?Patient Active Problem List  ? Diagnosis Date Noted  ? Hyperlipidemia 06/12/2021  ? CAP (community acquired pneumonia) 06/12/2021  ? Cerebellar bleed ([place])   ? Pulmonary mass   ? Intraparenchymal hemorrhage of brain ([place]) 06/11/2021  ? Type 2 diabetes mellitus with hyperglycemia ([place]) 06/11/2021  ? Pseudohyponatremia 06/11/2021  ? Dehydration 06/11/2021  ? Leukocytosis 06/11/2021  ? Thrombocytosis 06/11/2021  ? Hypercalcemia 06/11/2021  ? Carotid stenosis, bilateral 11/07/2011  ? Type 2 diabetes mellitus without complications ([place]) 97/67/3419  ? HYPERCHOLESTEROLEMIA 02/07/2010  ? Essential hypertension 02/07/2010  ? CAROTID BRUIT, LEFT 02/07/2010  ? CHEST PAIN 02/07/2010  ? ?Past Medical History:  ?Past Medical History:  ?Diagnosis Date  ? Allergy   ? BPH (benign prostatic hypertrophy)   ? Cancer [facility])   ? Melanoma on Neck    2008  ? Carotid artery occlusion   ? Diabetes mellitus   ? ED (erectile dysfunction)   ? Hyperlipidemia   ? Hypertension   ? Retinopathy due to secondary DM ([place])   ? ?Past Surgical History:  ?Past Surgical History:  ?Procedure Laterality Date  ? MELANOMA EXCISION  2008  ? Left side of neck  ? RADIOLOGY WITH ANESTHESIA N/A 04/05/2020  ? Procedure: MRI SPINE WITOUT CONTRAST;  Surgeon: Radiologist, Medication, MD;  Location: [facility];  Service: Radiology;  Laterality: N/A;  ? TONSILLECTOMY    ? ?HPI:  ?Pt is a 71 y.o. male who presented to [facility] for evaluation of ongoing headache, dizziness and gait ataxia. CT revealed acute to subacute intraparenchymal hemorrhage involving the left  cerebellum. PMH: uncontrolled diabetes, hypertension, hyperlipidemia, retinopathy from diabetes, based on carotid ultrasound 2014-bilateral ICA stenosis 60 to 79%.  ? ?Assessment / Plan /  Recommendation ?Clinical Impression ? Pt presents with cognitive communication deficits marked primarily by impairment in attention, orientation to time (day of week), delayed recall of listed items (4/5), problem solving and executive functions. [facility] Mental Status Examination (SLUMS) administered with the pt yielding the following score: 23/30, with a score of 21-26 suggesting Mild Cognitive impairment. Per pt and wife, pt was independent with all ADLs/IADLs at [facility]. Wife reports that his current cognitive presentation is different from his baseline. SLP services recommended to f/u for treatment of cognitive functions in order to maximize independence and safety upon discharge. ?   ?SLP Assessment ? SLP Recommendation/Assessment: Patient needs continued [place] [facility] Services ?SLP Visit Diagnosis: Cognitive communication deficit (R41.841)  ?  ?Recommendations for follow up therapy are one component of a multi-disciplinary discharge planning process, led by the attending physician.  Recommendations may be updated based on patient status, additional functional criteria and insurance authorization. ?   ?Follow Up Recommendations ? Outpatient SLP  ?  ?Assistance Recommended at Discharge ? Frequent or constant Supervision/Assistance  ?Functional Status Assessment Patient has had a recent decline in their functional status and demonstrates the ability to make significant improvements in function in a reasonable and predictable amount of time.  ?Frequency and Duration min 2x/week  ?2 weeks ?  ?   ?SLP Evaluation ?Cognition ? Overall Cognitive Status: Impaired/Different from baseline ?Arousal/Alertness: Lethargic ?Orientation Level: Oriented to person;Oriented to place;Oriented to situation;Disoriented to time ?Year: 2023 ?Month: April ?Day of Week: Incorrect ?Attention: Sustained;Selective ?Sustained Attention: Appears intact ?Selective Attention: Impaired ?Selective Attention Impairment:  Verbal  complex ?Memory: Impaired ?Memory Impairment: Storage deficit;Retrieval deficit;Decreased recall of new information ?Awareness: Impaired ?Awareness Impairment: Intellectual impairment ?Problem Solving: Impaired ?Problem Solving Impairment: Verbal complex ?Executive Function: Reasoning;Self Monitoring;Self Correcting ?Reasoning: Impaired ?Reasoning Impairment: Verbal complex ?Self Monitoring: Impaired ?Self Monitoring Impairment: Functional basic;Functional complex ?Self Correcting: Impaired ?Self Correcting Impairment: Functional basic;Functional complex  ?  ?   ?Comprehension ? Auditory Comprehension ?Overall Auditory Comprehension: Appears within functional limits for tasks assessed ?Visual Recognition/Discrimination ?Discrimination: Not tested ?Reading Comprehension ?Reading Status: Not tested  ?  ?Expression Expression ?Primary Mode of Expression: Verbal ?Verbal Expression ?Overall Verbal Expression: Appears within functional limits for tasks assessed ?Written Expression ?Dominant Hand: Right ?Written Expression: Not tested   ?Oral / Motor ? Oral Motor/Sensory Function ?Overall Oral Motor/Sensory Function: Within functional limits ?Motor Speech ?Overall Motor Speech: Appears within functional limits for tasks assessed   ?        ? ? ?Ellwood Dense, MA, CCC-SLP ?Acute Rehabilitation Services ?Office Number: [phone] ? ?[name] ?06/13/2021, 10:51 AM ? ?

## 2021-06-13 NOTE — Evaluation (Signed)
Physical Therapy Evaluation ?Patient Details ?Name: Micheal Donovan ?MRN: [id number] ?DOB: Jun 30, 1950 ?Today's Date: 06/13/2021 ? ?History of Present Illness ? Micheal Donovan is a 71 y.o. male who presented to [facility] for evaluation of ongoing headache, dizziness and gait ataxia. CT revealed acute to subacute intraparenchymal hemorrhage involving the left  cerebellum. Past medical history of uncontrolled diabetes, hypertension, hyperlipidemia, retinopathy from diabetes, based on carotid ultrasound 2014-bilateral ICA stenosis 60 to 79%  ?Clinical Impression ? Pt presents with decreased functional mobility, strength, balance, coordination, dizziness, and headache secondary to diagnosis above. These impairments are limiting his ability to safely and independently transfer, get into his home, perform all adls/iadls, and ambulate in the community. Pt to benefit from acute PT to address deficits. Bed mobility, STS, and step pivot transfer to a chair was performed.  Pt. Responded well but was limited secondary decreased strength, balance, coordination, and dizziness. He was min A for all mobility and transfers requiring support for power up and steady. He has significantly reduced activity tolerance secondary to headache and dizziness. SPT recommends AIR follow up for further functional mobility, strength, balance, coordination, and endurance training once medically stable for d/c. PT to progress mobility as tolerated, and will continue to follow acutely.  ?   ?   ? ?Recommendations for follow up therapy are one component of a multi-disciplinary discharge planning process, led by the attending physician.  Recommendations may be updated based on patient status, additional functional criteria and insurance authorization. ? ?Follow Up Recommendations Acute inpatient rehab (3hours/day) ? ?  ?Assistance Recommended at Discharge Frequent or constant Supervision/Assistance  ?Patient can return home with the following ? A  little help with walking and/or transfers;A little help with bathing/dressing/bathroom;Assistance with cooking/housework;Direct supervision/assist for financial management;Assist for transportation;Direct supervision/assist for medications management;Help with stairs or ramp for entrance ? ?  ?Equipment Recommendations Other (comment) (defer to next venue)  ?Recommendations for Other Services ? Rehab consult  ?  ?Functional Status Assessment Patient has had a recent decline in their functional status and demonstrates the ability to make significant improvements in function in a reasonable and predictable amount of time.  ? ?  ?Precautions / Restrictions Precautions ?Precautions: Fall ?Precaution Comments: Dizzy ?Restrictions ?Weight Bearing Restrictions: No  ? ?  ? ?Mobility ? Bed Mobility ?Overal bed mobility: Modified Independent ?  ?  ?  ?  ?  ?  ?General bed mobility comments: increased time needed for pain/dizzy management ?Patient Response: Flat affect ? ?Transfers ?Overall transfer level: Needs assistance ?Equipment used: Rolling walker (2 wheels) ?Transfers: Sit to/from Stand, Bed to chair/wheelchair/BSC ?Sit to Stand: Min assist ?  ?Step pivot transfers: Min assist ?  ?  ?  ?General transfer comment: Min A for power up for walker stability and for steady while stepping, unable to progress secondary to dizziness and pain. ?  ? ?Ambulation/Gait ?  ?  ?  ?  ?  ?  ?  ?  ? ?Stairs ?  ?  ?  ?  ?  ? ?Wheelchair Mobility ?  ? ?Modified Rankin (Stroke Patients Only) ?Modified Rankin (Stroke Patients Only) ?Pre-Morbid Rankin Score: Slight disability ?Modified Rankin: Moderately severe disability ? ?  ? ?Balance Overall balance assessment: Needs assistance ?Sitting-balance support: Feet supported ?Sitting balance-Leahy Scale: Fair ?Sitting balance - Comments: Able to tolerate sitting EOB, lateral lean that he can correct ?Postural control: Right lateral lean ?Standing balance support: Bilateral upper extremity  supported, During functional activity ?Standing balance-Leahy Scale: Poor ?Standing  balance comment: Requires RW and min A for standing and dynamic support ?  ?  ?  ?  ?  ?  ?  ?  ?  ?  ?  ?   ? ? ? ?Pertinent Vitals/Pain Pain Assessment ?Faces Pain Scale: Hurts even more ?Pain Location: head ?Pain Descriptors / Indicators: Headache, Discomfort ?Pain Intervention(s): Limited activity within patient's tolerance, Monitored during session  ? ? ?Home Living Family/patient expects to be discharged to:: Private residence ?Living Arrangements: Spouse/significant other ?Available Help at Discharge: Family;Available 24 hours/day ?Type of Home: House ?Home Access: Stairs to enter ?Entrance Stairs-Rails: None ?Entrance Stairs-Number of Steps: 3 or 1 in the garage ?  ?Home Layout: One level ?Home Equipment: Conservation officer, nature (2 wheels);Shower seat - built in ?Additional Comments: wife: Micheal Donovan  ?  ?Prior Function Prior Level of Function : Needs assist ?  ?  ?  ?Physical Assist : Mobility (physical) ?Mobility (physical): Gait ?  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  ? ? ?Hand Dominance  ? Dominant Hand: Right ? ?  ?Extremity/Trunk Assessment  ? Upper Extremity Assessment ?Upper Extremity Assessment: Defer to OT evaluation ?  ? ?Lower Extremity Assessment ?Lower Extremity Assessment: Generalized weakness (unable to officially test secondary to tolerance) ?  ? ?Cervical / Trunk Assessment ?Cervical / Trunk Assessment: Normal  ?Communication  ? Communication: No difficulties  ?Cognition Arousal/Alertness: Awake/alert ?Behavior During Therapy: Flat affect ?Overall Cognitive Status: Impaired/Different from baseline ?Area of Impairment: Attention, Following commands, Safety/judgement ?  ?  ?  ?  ?  ?  ?  ?  ?  ?Current Attention Level: Selective ?  ?Following Commands: Follows one step commands consistently, Follows  multi-step commands consistently ?Safety/Judgement: Decreased awareness of safety ?  ?  ?General Comments: Pt. with flat affect and regularly closing his eye secondary to dizziness ?  ?  ? ?  ?General Comments General comments (skin integrity, edema, etc.): wife present ? ?  ?Exercises    ? ?Assessment/Plan  ?  ?PT Assessment Patient needs continued PT services  ?PT Problem List Decreased strength;Decreased mobility;Decreased safety awareness;Decreased range of motion;Decreased coordination;Decreased activity tolerance;Decreased knowledge of precautions;Decreased balance ? ?   ?  ?PT Treatment Interventions DME instruction;Therapeutic exercise;Gait training;Balance training;Stair training;Neuromuscular re-education;Functional mobility training;Therapeutic activities;Patient/family education   ? ?PT Goals (Current goals can be found in the Care Plan section)  ?Acute Rehab PT Goals ?Patient Stated Goal: Get rid of headahce and improve balance ?PT Goal Formulation: With patient/family ?Time For Goal Achievement: 06/27/21 ?Potential to Achieve Goals: Fair ? ?  ?Frequency Min 4X/week ?  ? ? ?Co-evaluation   ?  ?  ?  ?  ? ? ?  ?AM-PAC PT "6 Clicks" Mobility  ?Outcome Measure Help needed turning from your back to your side while in a flat bed without using bedrails?: None ?Help needed moving from lying on your back to sitting on the side of a flat bed without using bedrails?: A Little ?Help needed moving to and from a bed to a chair (including a wheelchair)?: A Little ?Help needed standing up from a chair using your arms (e.g., wheelchair or bedside chair)?: A Little ?Help needed to walk in hospital room?: A Lot ?Help needed climbing 3-5 steps with a railing? : Total ?6 Click Score: 16 ? ?  ?End of Session Equipment Utilized During Treatment: Gait belt ?Activity Tolerance: Patient  limited by fatigue;Patient limited by pain ?Patient left: in chair;with call bell/phone within reach;with chair alarm set;with family/visitor  present ?Nurse Communication: Mobility status ?PT Visit Diagnosis: Unsteadiness on feet (R26.81);Dizziness and giddiness (R42);Other abnormalities of gait and mobility (R26.89) ?  ? ?Time: 1210-1226 ?PT Time Calculat

## 2021-06-13 NOTE — Progress Notes (Signed)
Pt's wife stated they are waiting to hear from provider about results from MRI and chest xray. Day shift nurse informed.  ?

## 2021-06-13 NOTE — TOC Initial Note (Signed)
Transition of Care (TOC) - Initial/Assessment Note  ? ? ?Patient Details  ?Name: Micheal Donovan ?MRN: [id number] ?Date of Birth: [birth year] ? ?Transition of Care (TOC) CM/SW Contact:    ?Pollie Friar, RN ?Phone Number: ?06/13/2021, 2:00 PM ? ?Clinical Narrative:                 ?Patient is from home with spouse. Current recommendations are for CIR. CIR to assess.  ?TOC following. ? ?Expected Discharge Plan: [place] ?Barriers to Discharge: Continued Medical Work up ? ? ?Patient Goals and CMS Choice ?  ?CMS Medicare.gov Compare Post Acute Care list provided to:: Patient ?Choice offered to / list presented to : Patient ? ?Expected Discharge Plan and Services ?Expected Discharge Plan: [place] ?  ?Discharge Planning Services: CM Consult ?Post Acute Care Choice: IP Rehab ?Living arrangements for the past 2 months: [place] ?                ?  ?  ?  ?  ?  ?  ?  ?  ?  ?  ? ?Prior Living Arrangements/Services ?Living arrangements for the past 2 months: [place] ?Lives with:: Spouse ?  ?       ?  ?  ?  ?  ? ?Activities of Daily Living ?Home Assistive Devices/Equipment: None ?ADL Screening (condition at time of admission) ?Patient's cognitive ability adequate to safely complete daily activities?: No ?Is the patient deaf or have difficulty hearing?: No ?Does the patient have difficulty seeing, even when wearing glasses/contacts?: No ?Does the patient have difficulty concentrating, remembering, or making decisions?: No ?Patient able to express need for assistance with ADLs?: No ?Does the patient have difficulty dressing or bathing?: No ?Independently performs ADLs?: Yes (appropriate for developmental age) ?Does the patient have difficulty walking or climbing stairs?: No ?Weakness of Legs: Both ?Weakness of Arms/Hands: Both ? ?Permission Sought/Granted ?  ?  ?   ?   ?   ?   ? ?Emotional Assessment ?  ?  ?  ?  ?  ?Psych Involvement: No (comment) ? ?Admission diagnosis:  Dehydration  [E86.0] ?Cerebellar bleed ([place]) [I61.4] ?Hyperglycemia [R73.9] ?Intraparenchymal hemorrhage of brain ([place]) [I61.9] ?Patient Active Problem List  ? Diagnosis Date Noted  ? Hyperlipidemia 06/12/2021  ? CAP (community acquired pneumonia) 06/12/2021  ? Cerebellar bleed ([place])   ? Pulmonary mass   ? Intraparenchymal hemorrhage of brain ([place]) 06/11/2021  ? Type 2 diabetes mellitus with hyperglycemia ([place]) 06/11/2021  ? Pseudohyponatremia 06/11/2021  ? Dehydration 06/11/2021  ? Leukocytosis 06/11/2021  ? Thrombocytosis 06/11/2021  ? Hypercalcemia 06/11/2021  ? Carotid stenosis, bilateral 11/07/2011  ? Type 2 diabetes mellitus without complications ([place]) 41/66/0630  ? HYPERCHOLESTEROLEMIA 02/07/2010  ? Essential hypertension 02/07/2010  ? CAROTID BRUIT, LEFT 02/07/2010  ? CHEST PAIN 02/07/2010  ? ?PCP:  [name], MD ?Pharmacy:   ?[facility], [address] ?[place] [address] ?Phone: [phone] Fax: [phone] ? ? ? ? ?Social Determinants of Health (SDOH) Interventions ?  ? ?Readmission Risk Interventions ?   ? View : No data to display.  ?  ?  ?  ? ? ? ?

## 2021-06-13 NOTE — Plan of Care (Signed)
Pt is sleepy but oriented x 4. Pt had ativan for MRI prior shift. Pt denies headache, nausea, or dizziness. Pt continues to have some ataxia. Pt using urinal for urine output and bedpan. Pts wife is present at bedside. Pt has congested cough, rhonci noted bilaterally. Pt sleeping and O2 levels 91-92%, pt placed on 2L nasal cannula for comfort. O2: 99% on nasal cannula. No distress noted.  ? ? ? ?Problem: Education: ?Goal: Knowledge of General Education information will improve ?Description: Including pain rating scale, medication(s)/side effects and non-pharmacologic comfort measures ?Outcome: Progressing ?  ?Problem: Health Behavior/Discharge Planning: ?Goal: Ability to manage health-related needs will improve ?Outcome: Progressing ?  ?Problem: Clinical Measurements: ?Goal: Ability to maintain clinical measurements within normal limits will improve ?Outcome: Progressing ?Goal: Will remain free from infection ?Outcome: Progressing ?Goal: Diagnostic test results will improve ?Outcome: Progressing ?Goal: Respiratory complications will improve ?Outcome: Progressing ?Goal: Cardiovascular complication will be avoided ?Outcome: Progressing ?  ?Problem: Activity: ?Goal: Risk for activity intolerance will decrease ?Outcome: Progressing ?  ?Problem: Nutrition: ?Goal: Adequate nutrition will be maintained ?Outcome: Progressing ?  ?Problem: Coping: ?Goal: Level of anxiety will decrease ?Outcome: Progressing ?  ?Problem: Elimination: ?Goal: Will not experience complications related to bowel motility ?Outcome: Progressing ?Goal: Will not experience complications related to urinary retention ?Outcome: Progressing ?  ?Problem: Pain Managment: ?Goal: General experience of comfort will improve ?Outcome: Progressing ?  ?Problem: Safety: ?Goal: Ability to remain free from injury will improve ?Outcome: Progressing ?  ?Problem: Skin Integrity: ?Goal: Risk for impaired skin integrity will decrease ?Outcome: Progressing ?  ?Problem:  Education: ?Goal: Knowledge of disease or condition will improve ?Outcome: Progressing ?Goal: Knowledge of secondary prevention will improve (SELECT ALL) ?Outcome: Progressing ?Goal: Knowledge of patient specific risk factors will improve (INDIVIDUALIZE FOR PATIENT) ?Outcome: Progressing ?Goal: Individualized Educational Video(s) ?Outcome: Progressing ?  ?Problem: Coping: ?Goal: Will verbalize positive feelings about self ?Outcome: Progressing ?Goal: Will identify appropriate support needs ?Outcome: Progressing ?  ?Problem: Health Behavior/Discharge Planning: ?Goal: Ability to manage health-related needs will improve ?Outcome: Progressing ?  ?Problem: Self-Care: ?Goal: Ability to participate in self-care as condition permits will improve ?Outcome: Progressing ?Goal: Verbalization of feelings and concerns over difficulty with self-care will improve ?Outcome: Progressing ?Goal: Ability to communicate needs accurately will improve ?Outcome: Progressing ?  ?Problem: Nutrition: ?Goal: Risk of aspiration will decrease ?Outcome: Progressing ?Goal: Dietary intake will improve ?Outcome: Progressing ?  ?Problem: Intracerebral Hemorrhage Tissue Perfusion: ?Goal: Complications of Intracerebral Hemorrhage will be minimized ?Outcome: Progressing ?  ?

## 2021-06-14 DIAGNOSIS — J189 Pneumonia, unspecified organism: Secondary | ICD-10-CM | POA: Diagnosis not present

## 2021-06-14 DIAGNOSIS — I619 Nontraumatic intracerebral hemorrhage, unspecified: Secondary | ICD-10-CM | POA: Diagnosis not present

## 2021-06-14 DIAGNOSIS — E860 Dehydration: Secondary | ICD-10-CM | POA: Diagnosis not present

## 2021-06-14 DIAGNOSIS — I614 Nontraumatic intracerebral hemorrhage in cerebellum: Secondary | ICD-10-CM | POA: Diagnosis not present

## 2021-06-14 LAB — GLUCOSE, CAPILLARY
Glucose-Capillary: 191 mg/dL — ABNORMAL HIGH (ref 70–99)
Glucose-Capillary: 168 mg/dL — ABNORMAL HIGH (ref 70–99)
Glucose-Capillary: 226 mg/dL — ABNORMAL HIGH (ref 70–99)
Glucose-Capillary: 153 mg/dL — ABNORMAL HIGH (ref 70–99)
Glucose-Capillary: 140 mg/dL — ABNORMAL HIGH (ref 70–99)

## 2021-06-14 LAB — BASIC METABOLIC PANEL
Anion gap: 7 (ref 5–15)
BUN: 13 mg/dL (ref 8–23)
CO2: 22 mmol/L (ref 22–32)
Calcium: 9.2 mg/dL (ref 8.9–10.3)
Chloride: 102 mmol/L (ref 98–111)
Creatinine, Ser: 0.65 mg/dL (ref 0.61–1.24)
GFR, Estimated: >60 mL/min (ref >60)
Glucose, Bld: 190 mg/dL — ABNORMAL HIGH (ref 70–99)
Potassium: 3.8 mmol/L (ref 3.5–5.1)
Sodium: 131 mmol/L — ABNORMAL LOW (ref 135–145)

## 2021-06-14 LAB — CBC
HCT: 45.4 % (ref 39.0–52.0)
Hemoglobin: 15.5 g/dL (ref 13.0–17.0)
MCH: 28.6 pg (ref 26.0–34.0)
MCHC: 34.1 g/dL (ref 30.0–36.0)
MCV: 83.8 fL (ref 80.0–100.0)
Platelets: 258 10*3/uL (ref 150–400)
RBC: 5.42 MIL/uL (ref 4.22–5.81)
RDW: 12.8 % (ref 11.5–15.5)
WBC: 9.1 10*3/uL (ref 4.0–10.5)
nRBC: 0 % (ref 0.0–0.2)

## 2021-06-14 LAB — LEGIONELLA PNEUMOPHILA SEROGP 1 UR AG: L. pneumophila Serogp 1 Ur Ag: NEGATIVE

## 2021-06-14 LAB — MAGNESIUM: Magnesium: 2.1 mg/dL (ref 1.7–2.4)

## 2021-06-14 MED ORDER — IPRATROPIUM-ALBUTEROL 0.5-2.5 (3) MG/3ML IN SOLN
3.0000 mL | Freq: Three times a day (TID) | RESPIRATORY_TRACT | Status: DC
  Administered 2021-06-14: 3 mL via RESPIRATORY_TRACT
  Filled 2021-06-14: qty 3

## 2021-06-14 MED ORDER — TRAMADOL HCL 50 MG PO TABS
100.0000 mg | ORAL_TABLET | Freq: Four times a day (QID) | ORAL | Status: DC | PRN
Start: 2021-06-14 — End: 2021-06-20
  Administered 2021-06-14 – 2021-06-20 (×13): 100 mg via ORAL
  Filled 2021-06-14 (×15): qty 2

## 2021-06-14 MED ORDER — IPRATROPIUM-ALBUTEROL 0.5-2.5 (3) MG/3ML IN SOLN
3.0000 mL | RESPIRATORY_TRACT | Status: DC | PRN
  Administered 2021-06-15: 3 mL via RESPIRATORY_TRACT
  Filled 2021-06-14: qty 3

## 2021-06-14 MED ORDER — INSULIN ASPART 100 UNIT/ML IJ SOLN
4.0000 [IU] | Freq: Three times a day (TID) | INTRAMUSCULAR | Status: DC
  Administered 2021-06-14 – 2021-06-15 (×3): 4 [IU] via SUBCUTANEOUS

## 2021-06-14 MED ORDER — MOMETASONE FURO-FORMOTEROL FUM 200-5 MCG/ACT IN AERO
2.0000 | INHALATION_SPRAY | Freq: Two times a day (BID) | RESPIRATORY_TRACT | Status: DC
  Administered 2021-06-14 – 2021-06-20 (×12): 2 via RESPIRATORY_TRACT
  Filled 2021-06-14: qty 8.8

## 2021-06-14 MED ORDER — GUAIFENESIN ER 600 MG PO TB12
1200.0000 mg | ORAL_TABLET | Freq: Two times a day (BID) | ORAL | Status: DC
  Administered 2021-06-14 – 2021-06-20 (×12): 1200 mg via ORAL
  Filled 2021-06-14 (×12): qty 2

## 2021-06-14 MED ORDER — LORATADINE 10 MG PO TABS
10.0000 mg | ORAL_TABLET | Freq: Every day | ORAL | Status: DC
  Administered 2021-06-14 – 2021-06-20 (×7): 10 mg via ORAL
  Filled 2021-06-14 (×7): qty 1

## 2021-06-14 MED ORDER — FLUTICASONE PROPIONATE 50 MCG/ACT NA SUSP
2.0000 | Freq: Every day | NASAL | Status: DC
  Administered 2021-06-15 – 2021-06-20 (×6): 2 via NASAL
  Filled 2021-06-14: qty 16

## 2021-06-14 MED ORDER — IPRATROPIUM-ALBUTEROL 0.5-2.5 (3) MG/3ML IN SOLN
3.0000 mL | Freq: Four times a day (QID) | RESPIRATORY_TRACT | Status: DC | PRN
  Administered 2021-06-14: 3 mL via RESPIRATORY_TRACT
  Filled 2021-06-14: qty 3

## 2021-06-14 MED ORDER — HYDROCODONE BIT-HOMATROP MBR 5-1.5 MG/5ML PO SOLN
5.0000 mL | Freq: Four times a day (QID) | ORAL | Status: DC | PRN
  Administered 2021-06-14 – 2021-06-15 (×2): 5 mL via ORAL
  Filled 2021-06-14 (×2): qty 5

## 2021-06-14 NOTE — Progress Notes (Signed)
Pt  called out stating he felt short of breath. Increased rhonchi and crackles noted bilaterally. The rattling can be heard with out stethoscope. Pt c/o cough earlier, prn cough medication was given. Pt ws placed on 2L nasal cannula. Pt repositioned to side. Respiratory therapist called to assess pt aswell. On call provider then paged to inform.  ?

## 2021-06-14 NOTE — Care Management Important Message (Signed)
Important Message ? ?Patient Details  ?Name: Micheal Donovan ?MRN: [id number] ?Date of Birth: 06/07/1950 ? ? ?Medicare Important Message Given:  Yes ? ? ? ? ?Clarine Elrod ?06/14/2021, 2:23 PM ?

## 2021-06-14 NOTE — Progress Notes (Signed)
Inpatient Rehab Admissions Coordinator:  ? ?Met with patient and spouse at the bedside.  Discussed CIR recommendations and goals/expectations of CIR stay (3 hrs/day of therapy, average length of stay 2 weeks, and goals for supervision/mod I at discharge).  We reviewed need for insurance approval and timing of starting that process given ongoing workup.  I will continue to follow for readiness.  ? ?Shann Medal, PT, DPT ?Admissions Coordinator ?[phone] ?06/14/21  ?12:33 PM ? ?

## 2021-06-14 NOTE — Progress Notes (Signed)
?PROGRESS NOTE ? ? ? KERON NEENAN  WIO:[id number] DOB: 1950/09/07 DOA: 06/10/2021 ?PCP: [name], MD ? ? ?Chief Complaint  ?Patient presents with  ? Hyperglycemia  ? ? ?Brief Narrative:  ?Micheal Donovan is a 71 y.o. male with medical history significant of T2DM, hypertension who presents to the emergency department due to 1 week onset of generalized weakness, dizziness and headache in the posterior part of head.   ?  ?Patient indicates he has been out of home blood pressure and diabetes medications for months, presented to PCP with notable hyperglycemia, hypertension and profound dehydration and subsequently sent to the hospital for further evaluation and treatment. ?  ?Unfortunately in the ED patient's hyperglycemia was profound requiring insulin drip overnight, patient's headache was followed up with CT head noncontrast which was remarkable for acute to subacute intraparenchymal hemorrhage of the left cerebellum 3.3 x 2.7 x 4.3 cm around 19 mL.  Given these acute findings patient's blood pressure was aggressively controlled, patient is transferred to [facility] for closer neurology and neurosurgical follow-up. ? ? ?Assessment & Plan: ?  ?Principal Problem: ?  Intraparenchymal hemorrhage of brain ([place]) ?Active Problems: ?  Essential hypertension ?  Type 2 diabetes mellitus with hyperglycemia ([facility]) ?  Pseudohyponatremia ?  Dehydration ?  Leukocytosis ?  Thrombocytosis ?  Hypercalcemia ?  Hyperlipidemia ?  CAP (community acquired pneumonia) ?  Cerebellar bleed ([place]) ?  Pulmonary mass ?  Headache ? ?#1 subacute intraparenchymal hemorrhage of the brain versus hemorrhagic transformation of ischemic stroke/headache ?-Patient noted to have presented with elevated blood sugar levels, noted to have some dizziness and gait ataxia not improving over the past week. ?-CT head done concerning for subacute intraparenchymal hemorrhage involving the left cerebellum of about 19 cc with surrounding low-density  vasogenic edema and mild regional mass effect and partial effacement of fourth ventricle which was patent without evidence of hydrocephalus or herniation. ?-MRI brain done with no significant interval change in size of left cerebellar intraparenchymal hematoma with unchanged regional mass effect and partial effacement of fourth ventricle but no upstream hydrocephalus. ?-2D echo EF 55 to 42%,ASTM, grade 1 diastolic dysfunction, normal right ventricular systolic function, no source of emboli noted. 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  Findings suspected to be incidental in nature and unlikely to be source of left cerebellar hemorrhage.  No other vascular abnormality seen underlying left cerebellar bleed.  Moderate atheromatous stenosis involving the dominant mid right V4 segment.  Patchy consolidative left upper lobe opacity suspicious for pneumonia.  Emphysema. ?-CT chest with concern for primary lung carcinoma. ?-LDL 213, hemoglobin A1c 13.6. ?-Continue Lipitor. ?-BP control with goal systolic blood pressure < 160. ?-PT/OT/ST. ?-Neurology recommending starting aspirin 81 mg daily due to carotid stenosis in 5 to 7 days. ?-Neurology following and I appreciate their input and recommendations. ? ?2.  DKA versus honk/anion gap metabolic acidosis in the setting of uncontrolled diabetes mellitus/hyperglycemia ?-On admission at [facility] patient was placed on the Endo tool/insulin drip and subsequently transitioned off insulin drip. ?-Hemoglobin A1c 13.6. ?-CBG 191 this morning. ?-Continue Semglee to 14 units daily.  ?-Start NovoLog 4  units  3 times daily with meals. ?-SSI. ?-Diabetes coordinator following. ? ?3.  Pulmonary mass noted on chest x-ray +/- pneumonia ?-Chest x-ray done with a 4.4 cm left midlung pulmonary mass concerning for malignancy. ?-CT angiogram head and neck done with some concern for patchy consolidative left upper lobe opacity suspicious for pneumonia. ?-CT chest done this morning 06/13/2021 with a 4.3 x 3.6 x 3.3 cm lobular spiculated mass in the posterior left upper lobe with teetering to the lateral pleural and major fissure with imaging findings consistent with primary bronchogenic neoplasm.  Metastatic left hilar lymphadenopathy.  Peripheral micronodularity posterior right costophrenic sulcus, likely related to infectious/inflammatory etiology and/or aspiration. ?-Urine Legionella antigen negative ?-Urine strep pneumococcus antigen negative. ?-Continue empiric IV Rocephin and azithromycin and treat for total of 5 to 7 days. ?-Place on Claritin, Flonase, Dulera, Mucinex, scheduled DuoNebs. ?-Patient seen in consultation by pulmonary, Dr. Lamonte Sakai who discussed options for tissue diagnosis including bronchoscopy and EBUS however patient wants to think about his options and then more about the procedure going forward before committing to scheduling it.   ?-Per pulmonary. ?-Supportive care. ? ?4.  Medication noncompliance ?-Dr. Avon Gully discussed with patient at bedside need for medication compliance given his hypertension, concurrent intraparenchymal hemorrhage in DKA. ? ?5.  Pseudohyponatremia ?-Improved with correction of hyperglycemia. ? ?6.  Hypertension ?-Blood pressure controlled on current regimen of Cozaar, Norvasc.   ? ?7.  Hyperglycemia ?-Resolved. ? ?8.  Dehydration ?-Improved with hydration. ? ?9.  Hemoconcentration with notable leukocytosis, thrombocytosis and polycythemia ?Improved with hydration. ? ?10.  Headaches ?-Patient with complaints of headaches despite narcotics.  CT chest with concern for primary lung  carcinoma.  Patient started on Medrol Dosepak per neurology which patient states is helping headaches.   ?-Ultram adjusted to 100 mg as needed.   ?-Outpatient follow-up.   ? ? ?DVT prophylaxis: SCDs ?Code Status: Full ?Family Communication: No family at bedside. ?Disposition: CIR ? ?Status is: Inpatient ?Remains inpatient appropriate because: Severity of illness ?  ?Consultants:  ?Neurology: Dr. Rory Percy 06/11/2021 ?Pulmonary: Dr. Lamonte Sakai 06/13/2021 ? ?Procedures:  ?CT head 06/11/2021 ?CT angiogram head and neck 06/12/2021 ?Chest x-ray 06/12/2021 ?MRI brain 06/12/2021 ?2D echo 06/12/2021 ?CT chest 06/13/2021 ? ? ?Antimicrobials:  ?Anti-infectives (From admission, onward)  ? ? Start     Dose/Rate Route Frequency Ordered Stop  ? 06/12/21 1030  cefTRIAXone (ROCEPHIN) 2 g in sodium chloride 0.9 % 100 mL IVPB       ? 2 g ?200 mL/hr over 30 Minutes Intravenous Every 24 hours 06/12/21 0930 06/17/21 1029  ? 06/12/21 1030  azithromycin (ZITHROMAX) 500 mg in sodium chloride 0.9 % 250 mL IVPB       ? 500 mg ?250 mL/hr over 60 Minutes Intravenous Every 24 hours 06/12/21 0930 06/17/21 1029  ? ?  ?  ? ? ?Subjective: ?Patient laying in bed alert.  Some improvement with headache after being started on Medrol Dosepak.  No chest pain.  No shortness of breath.  No abdominal pain.   ? ?Objective: ?Vitals:  ? 06/14/21 0431 06/14/21 0829 06/14/21 0907 06/14/21 1222  ?BP: (!) 152/83 (!) 148/77  (!) 136/91  ?Pulse: 89 86  86  ?Resp: 20 20  18   ?Temp: 97.8 ?F (36.6 ?C) 98.1 ?F (36.7 ?C)  98.5 ?F (36.9 ?C)  ?TempSrc: Oral Oral    ?SpO2: 98% 99% 96% 95%  ?Weight:      ?Height:      ? ? ?Intake/Output Summary (Last 24 hours) at 06/14/2021 1244 ?Last data  filed at 06/14/2021 0159 ?Gross per 24 hour  ?Intake --  ?Output 300 ml  ?Net -300 ml  ? ? ?Filed Weights  ? 06/10/21 2115  ?Weight: 67.2 kg  ? ? ?Examination: ? ?General exam: NAD ?Respiratory system: Some scattered coarse breath sounds.  No wheezing.  Fair air movement.  Speaking in full sentences.   ?Cardiovascular system: Regular rate rhythm no murmurs rubs or gallops.  No JVD.  No lower extremity edema. ?Gastrointestinal system: Abdomen is nondistended, soft and nontender. No organomegaly or masses felt. N

## 2021-06-14 NOTE — Plan of Care (Signed)
Pt is awake and alert, pt states he feels better today.  ? ? ?Problem: Education: ?Goal: Knowledge of General Education information will improve ?Description: Including pain rating scale, medication(s)/side effects and non-pharmacologic comfort measures ?Outcome: Progressing ?  ?Problem: Clinical Measurements: ?Goal: Ability to maintain clinical measurements within normal limits will improve ?Outcome: Progressing ?Goal: Will remain free from infection ?Outcome: Progressing ?Goal: Diagnostic test results will improve ?Outcome: Progressing ?Goal: Respiratory complications will improve ?Outcome: Progressing ?Goal: Cardiovascular complication will be avoided ?Outcome: Progressing ?  ?Problem: Activity: ?Goal: Risk for activity intolerance will decrease ?Outcome: Progressing ?  ?Problem: Nutrition: ?Goal: Adequate nutrition will be maintained ?Outcome: Progressing ?  ?Problem: Coping: ?Goal: Level of anxiety will decrease ?Outcome: Progressing ?  ?Problem: Elimination: ?Goal: Will not experience complications related to bowel motility ?Outcome: Progressing ?Goal: Will not experience complications related to urinary retention ?Outcome: Progressing ?  ?Problem: Pain Managment: ?Goal: General experience of comfort will improve ?Outcome: Progressing ?  ?Problem: Safety: ?Goal: Ability to remain free from injury will improve ?Outcome: Progressing ?  ?Problem: Skin Integrity: ?Goal: Risk for impaired skin integrity will decrease ?Outcome: Progressing ?  ?Problem: Education: ?Goal: Knowledge of disease or condition will improve ?Outcome: Progressing ?Goal: Knowledge of secondary prevention will improve (SELECT ALL) ?Outcome: Progressing ?Goal: Knowledge of patient specific risk factors will improve (INDIVIDUALIZE FOR PATIENT) ?Outcome: Progressing ?Goal: Individualized Educational Video(s) ?Outcome: Progressing ?  ?Problem: Coping: ?Goal: Will verbalize positive feelings about self ?Outcome: Progressing ?Goal: Will identify  appropriate support needs ?Outcome: Progressing ?  ?Problem: Health Behavior/Discharge Planning: ?Goal: Ability to manage health-related needs will improve ?Outcome: Progressing ?  ?Problem: Self-Care: ?Goal: Ability to participate in self-care as condition permits will improve ?Outcome: Progressing ?Goal: Verbalization of feelings and concerns over difficulty with self-care will improve ?Outcome: Progressing ?Goal: Ability to communicate needs accurately will improve ?Outcome: Progressing ?  ?Problem: Nutrition: ?Goal: Risk of aspiration will decrease ?Outcome: Progressing ?Goal: Dietary intake will improve ?Outcome: Progressing ?  ?Problem: Intracerebral Hemorrhage Tissue Perfusion: ?Goal: Complications of Intracerebral Hemorrhage will be minimized ?Outcome: Progressing ?  ?

## 2021-06-14 NOTE — Progress Notes (Signed)
2332 pt c/o of nausea and headache. PRN tramadol given per order. Message sent to pharmacy to request dose of IV phenergan.  ? ?0109 pt c/o increased cough, prn Robitussin Dm given. Pt c/I headache remained, PRN tylenol given per order.  ?

## 2021-06-14 NOTE — Progress Notes (Signed)
STROKE TEAM PROGRESS NOTE  ? ?INTERVAL HISTORY ?His wife is at the bedside.  Patient states his headaches is a lot to his improved after starting Solu-Medrol.   Patient has been seen by pulmonary who recommended biopsy of his lung lesion but patient is still thinking about it and has not made up his mind yet.  He is awaiting inpatient rehab.  Neurological exam is unchanged.  Vital signs are stable. ?Vitals:  ? 06/14/21 0431 06/14/21 0829 06/14/21 0907 06/14/21 1222  ?BP: (!) 152/83 (!) 148/77  (!) 136/91  ?Pulse: 89 86  86  ?Resp: 20 20  18   ?Temp: 97.8 ?F (36.6 ?C) 98.1 ?F (36.7 ?C)  98.5 ?F (36.9 ?C)  ?TempSrc: Oral Oral    ?SpO2: 98% 99% 96% 95%  ?Weight:      ?Height:      ? ?CBC:  ?Recent Labs  ?Lab 06/12/21 ?0925 06/13/21 ?0311 06/14/21 ?0315  ?WBC 9.6 8.9 9.1  ?NEUTROABS 7.3 6.3  --   ?HGB 15.1 14.7 15.5  ?HCT 46.7 44.2 45.4  ?MCV 86.8 85.5 83.8  ?PLT 343 281 258  ? ?Basic Metabolic Panel:  ?Recent Labs  ?Lab 06/11/21 ?0118 06/12/21 ?0925 06/13/21 ?0311 06/14/21 ?0315  ?NA 132*   < > 132* 131*  ?K 4.2   < > 3.8 3.8  ?CL 97*   < > 101 102  ?CO2 22   < > 22 22  ?GLUCOSE 310*   < > 171* 190*  ?BUN 32*   < > 16 13  ?CREATININE 0.88   < > 0.66 0.65  ?CALCIUM 9.6   < > 9.1 9.2  ?MG 2.3   < > 2.0 2.1  ?PHOS 4.2  --   --   --   ? < > = values in this interval not displayed.  ? ?Lipid Panel:  ?Recent Labs  ?Lab 06/12/21 ?4097  ?CHOL 266*  ?TRIG 144  ?HDL 24*  ?CHOLHDL 11.1  ?VLDL 29  ?[place] 213*  ? ?HgbA1c:  ?Recent Labs  ?Lab 06/10/21 ?1210  ?HGBA1C 13.6*  ? ?Urine Drug Screen:  ?Recent Labs  ?Lab 06/11/21 ?0239  ?LABOPIA NONE DETECTED  ?COCAINSCRNUR NONE DETECTED  ?LABBENZ NONE DETECTED  ?AMPHETMU NONE DETECTED  ?THCU NONE DETECTED  ?LABBARB NONE DETECTED  ?  ?Alcohol Level  ?Recent Labs  ?Lab 06/11/21 ?0118  ?ETH <10  ? ? ?IMAGING past 24 hours ?VAS US CAROTID ? ?Result Date: 06/13/2021 ?Carotid Arterial Duplex Study Patient Name:  Micheal Donovan  Date of Exam:   06/13/2021 Medical Rec #: [id number]      Accession #:     [id number] Date of Birth: 22-Jan-1951      Patient Gender: M Patient Age:   89 years Exam Location:  [facility] Procedure:      VAS US CAROTID Referring Phys: Janine Ores --------------------------------------------------------------------------------  Indications:      CVA. Risk Factors:     Hypertension, hyperlipidemia, Diabetes, past history of                   smoking. Comparison Study: 06-11-2021 CTA head/neck showed left ICA stenosis of up to                   75%.                    06-04-2012 Carotid duplex study showed 60-79% left ICA  stenosis. Performing Technologist: Darlin Coco RDMS, RVT  Examination Guidelines: A complete evaluation includes B-mode imaging, spectral Doppler, color Doppler, and power Doppler as needed of all accessible portions of each vessel. Bilateral testing is considered an integral part of a complete examination. Limited examinations for reoccurring indications may be performed as noted.  Right Carotid Findings: +----------+--------+--------+--------+-------------------------+--------+           PSV cm/sEDV cm/sStenosisPlaque Description       Comments +----------+--------+--------+--------+-------------------------+--------+ CCA Prox  74      11                                                +----------+--------+--------+--------+-------------------------+--------+ CCA Distal69      19                                                +----------+--------+--------+--------+-------------------------+--------+ ICA Prox  100     25      1-39%   calcific and heterogenous         +----------+--------+--------+--------+-------------------------+--------+ ICA Distal53      16                                                +----------+--------+--------+--------+-------------------------+--------+ ECA       112     14                                                 +----------+--------+--------+--------+-------------------------+--------+ +----------+--------+-------+----------------+-------------------+           PSV cm/sEDV cmsDescribe        Arm Pressure (mmHG) +----------+--------+-------+----------------+-------------------+ Subclavian140            Multiphasic, WNL                    +----------+--------+-------+----------------+-------------------+ +---------+--------+--+--------+-+---------+ VertebralPSV cm/s33EDV cm/s8Antegrade +---------+--------+--+--------+-+---------+  Left Carotid Findings: +----------+--------+--------+--------+-----------------------+--------+           PSV cm/sEDV cm/sStenosisPlaque Description     Comments +----------+--------+--------+--------+-----------------------+--------+ CCA Prox  79      17              heterogenous                    +----------+--------+--------+--------+-----------------------+--------+ CCA Distal77      19              heterogenous                    +----------+--------+--------+--------+-----------------------+--------+ ICA Prox  199     60      60-79%  calcific and hypoechoic         +----------+--------+--------+--------+-----------------------+--------+ ICA Mid   139     38                                              +----------+--------+--------+--------+-----------------------+--------+  ICA Distal59      18                                              +----------+--------+--------+--------+-----------------------+--------+ ECA       126     10                                              +----------+--------+--------+--------+-----------------------+--------+ +----------+--------+--------+----------------+-------------------+           PSV cm/sEDV cm/sDescribe        Arm Pressure (mmHG) +----------+--------+--------+----------------+-------------------+ [id number]              Multiphasic, WNL                     +----------+--------+--------+----------------+-------------------+ +---------+--------+--+--------+-+---------+ VertebralPSV cm/s35EDV cm/s6Antegrade +---------+--------+--+--------+-+---------+   Summary: Right Carotid: Velocities in the right ICA are consistent with a 1-39% stenosis. Left Carotid: Velocities in the left ICA are consistent with a 60-79% stenosis. Vertebrals:  Bilateral vertebral arteries demonstrate antegrade flow. Subclavians: Normal flow hemodynamics were seen in bilateral subclavian              arteries. *See table(s) above for measurements and observations.     Preliminary    ? ?PHYSICAL EXAM ? ?Physical Exam  ?Constitutional: Appears well-developed and well-nourished.  Elderly male not in distress ?Cardiovascular: Normal rate and regular rhythm.  ?Respiratory: Effort normal, non-labored breathing ? ?Neuro: ?Mental Status: ?Patient is awake, alert, oriented to person, place, month, year, and situation. ?Patient is able to give a clear and coherent history. ?No signs of aphasia or neglect ?Cranial Nerves: ?II: Visual Fields are full. Pupils are equal, round, and reactive to light.  Saccadic dysmettria to the left only but no nystagmus ?III,IV, VI: EOMI without ptosis or diploplia.  ?V: Facial sensation is symmetric to temperature ?VII: Facial movement is symmetric resting and smiling ?VIII: Hearing is intact to voice ?X: Palate elevates symmetrically ?XI: Shoulder shrug is symmetric. ?XII: Tongue protrudes midline without atrophy or fasciculations.  ?Motor: ?Tone is normal. Bulk is normal. 5/5 strength was present in all four extremities.  ?Sensory: ?Sensation is symmetric to light touch in the arms and legs. ?Cerebellar: ?FNF and intact bilaterally ? ? ?ASSESSMENT/PLAN ?[name] is a 71 y.o. male with history of  uncontrolled diabetes, hypertension, hyperlipidemia, retinopathy from diabetes, based on carotid ultrasound 2014-bilateral ICA stenosis 60 to 79%, presented to the  emergency room at [facility] for evaluation of a headache that had been going on for 1 week.  He reports that he took some aspirin for his headache and that his blood sugar and blood pressure have been abnormally high this week.  MRI shows no significant change in the size of the IPH, unchanged regional mass effect.  Carotid Dopplers are pending ? ?Stroke:  IPH involving the left cerebellu

## 2021-06-14 NOTE — Progress Notes (Signed)
PT Cancellation Note ? ?Patient Details ?Name: Micheal Donovan ?MRN: [id number] ?DOB: [birth year] ? ? ?Cancelled Treatment:    Reason Eval/Treat Not Completed: Medical issues which prohibited therapy;Other (comment) - PT attempted to see pt at 1515, at that time awaiting nausea meds and requests PT check back in 20 minutes. Upon PT return, pt still had not received nausea meds from pharmacy, attempted to sit EOB to progress to OOB mobility but immediately became acutely nauseous and returned to supine. Pt declined further attempt. PT to check back at a later date.  ? ?Stacie Glaze, PT DPT ?Acute Rehabilitation Services ?Pager [phone]  ?Office [phone] ? ? ? ? ?Micheal Donovan ?06/14/2021, 4:07 PM ?

## 2021-06-15 DIAGNOSIS — I619 Nontraumatic intracerebral hemorrhage, unspecified: Secondary | ICD-10-CM | POA: Diagnosis not present

## 2021-06-15 DIAGNOSIS — E860 Dehydration: Secondary | ICD-10-CM | POA: Diagnosis not present

## 2021-06-15 DIAGNOSIS — J189 Pneumonia, unspecified organism: Secondary | ICD-10-CM | POA: Diagnosis not present

## 2021-06-15 DIAGNOSIS — I614 Nontraumatic intracerebral hemorrhage in cerebellum: Secondary | ICD-10-CM | POA: Diagnosis not present

## 2021-06-15 LAB — BASIC METABOLIC PANEL
Anion gap: 12 (ref 5–15)
BUN: 14 mg/dL (ref 8–23)
CO2: 19 mmol/L — ABNORMAL LOW (ref 22–32)
Calcium: 9.3 mg/dL (ref 8.9–10.3)
Chloride: 100 mmol/L (ref 98–111)
Creatinine, Ser: 0.63 mg/dL (ref 0.61–1.24)
GFR, Estimated: >60 mL/min (ref >60)
Glucose, Bld: 192 mg/dL — ABNORMAL HIGH (ref 70–99)
Potassium: 4.5 mmol/L (ref 3.5–5.1)
Sodium: 131 mmol/L — ABNORMAL LOW (ref 135–145)

## 2021-06-15 LAB — CBC WITH DIFFERENTIAL/PLATELET
Abs Immature Granulocytes: 0.06 10*3/uL (ref 0.00–0.07)
Basophils Absolute: 0 10*3/uL (ref 0.0–0.1)
Basophils Relative: 0 %
Eosinophils Absolute: 0.1 10*3/uL (ref 0.0–0.5)
Eosinophils Relative: 1 %
HCT: 48.3 % (ref 39.0–52.0)
Hemoglobin: 16.2 g/dL (ref 13.0–17.0)
Immature Granulocytes: 1 %
Lymphocytes Relative: 16 %
Lymphs Abs: 1.9 10*3/uL (ref 0.7–4.0)
MCH: 28.7 pg (ref 26.0–34.0)
MCHC: 33.5 g/dL (ref 30.0–36.0)
MCV: 85.6 fL (ref 80.0–100.0)
Monocytes Absolute: 0.6 10*3/uL (ref 0.1–1.0)
Monocytes Relative: 5 %
Neutro Abs: 9.5 10*3/uL — ABNORMAL HIGH (ref 1.7–7.7)
Neutrophils Relative %: 77 %
Platelets: 318 10*3/uL (ref 150–400)
RBC: 5.64 MIL/uL (ref 4.22–5.81)
RDW: 12.9 % (ref 11.5–15.5)
WBC: 12.2 10*3/uL — ABNORMAL HIGH (ref 4.0–10.5)
nRBC: 0 % (ref 0.0–0.2)

## 2021-06-15 LAB — GLUCOSE, CAPILLARY
Glucose-Capillary: 210 mg/dL — ABNORMAL HIGH (ref 70–99)
Glucose-Capillary: 204 mg/dL — ABNORMAL HIGH (ref 70–99)
Glucose-Capillary: 241 mg/dL — ABNORMAL HIGH (ref 70–99)
Glucose-Capillary: 194 mg/dL — ABNORMAL HIGH (ref 70–99)
Glucose-Capillary: 276 mg/dL — ABNORMAL HIGH (ref 70–99)

## 2021-06-15 LAB — MAGNESIUM: Magnesium: 2 mg/dL (ref 1.7–2.4)

## 2021-06-15 MED ORDER — ALUM & MAG HYDROXIDE-SIMETH 200-200-20 MG/5ML PO SUSP
30.0000 mL | Freq: Four times a day (QID) | ORAL | Status: DC | PRN
  Administered 2021-06-15 (×2): 30 mL via ORAL
  Filled 2021-06-15 (×2): qty 30

## 2021-06-15 MED ORDER — KETOROLAC TROMETHAMINE 30 MG/ML IJ SOLN: 30.0000 mg | Freq: Four times a day (QID) | INTRAMUSCULAR | Status: DC | PRN

## 2021-06-15 MED ORDER — INSULIN GLARGINE-YFGN 100 UNIT/ML ~~LOC~~ SOLN
18.0000 [IU] | Freq: Every day | SUBCUTANEOUS | Status: DC
  Filled 2021-06-15: qty 0.18

## 2021-06-15 MED ORDER — INSULIN GLARGINE-YFGN 100 UNIT/ML ~~LOC~~ SOLN
4.0000 [IU] | Freq: Once | SUBCUTANEOUS | Status: AC
  Administered 2021-06-15: 4 [IU] via SUBCUTANEOUS
  Filled 2021-06-15: qty 0.04

## 2021-06-15 MED ORDER — PANTOPRAZOLE SODIUM 40 MG PO TBEC
40.0000 mg | DELAYED_RELEASE_TABLET | Freq: Every day | ORAL | Status: DC
  Administered 2021-06-15 – 2021-06-20 (×6): 40 mg via ORAL
  Filled 2021-06-15 (×6): qty 1

## 2021-06-15 MED ORDER — AMLODIPINE BESYLATE 10 MG PO TABS
10.0000 mg | ORAL_TABLET | Freq: Every day | ORAL | Status: DC
  Administered 2021-06-16 – 2021-06-20 (×5): 10 mg via ORAL
  Filled 2021-06-15 (×5): qty 1

## 2021-06-15 MED ORDER — LIDOCAINE VISCOUS HCL 2 % MT SOLN
15.0000 mL | Freq: Four times a day (QID) | OROMUCOSAL | Status: DC | PRN
  Filled 2021-06-15: qty 15

## 2021-06-15 NOTE — Plan of Care (Signed)
Pt is alert oriented x 4. Pt c/o headache, prn tramadol given. Pt c/o nausea prn phenergan given. Pt states when he moves around in bed he becomes dizzy and then nauseated. PRN cough syrup given for congested cough. Pt had SOB and increased Rhonchi last night, respiratory therapist called and informed, prn breathing treatment given, effective. Pts wife present at bedside.  ? ? ?Problem: Education: ?Goal: Knowledge of General Education information will improve ?Description: Including pain rating scale, medication(s)/side effects and non-pharmacologic comfort measures ?Outcome: Progressing ?  ?Problem: Health Behavior/Discharge Planning: ?Goal: Ability to manage health-related needs will improve ?Outcome: Progressing ?  ?Problem: Clinical Measurements: ?Goal: Ability to maintain clinical measurements within normal limits will improve ?Outcome: Progressing ?Goal: Will remain free from infection ?Outcome: Progressing ?Goal: Diagnostic test results will improve ?Outcome: Progressing ?Goal: Respiratory complications will improve ?Outcome: Progressing ?Goal: Cardiovascular complication will be avoided ?Outcome: Progressing ?  ?Problem: Activity: ?Goal: Risk for activity intolerance will decrease ?Outcome: Progressing ?  ?Problem: Nutrition: ?Goal: Adequate nutrition will be maintained ?Outcome: Progressing ?  ?Problem: Coping: ?Goal: Level of anxiety will decrease ?Outcome: Progressing ?  ?Problem: Elimination: ?Goal: Will not experience complications related to bowel motility ?Outcome: Progressing ?Goal: Will not experience complications related to urinary retention ?Outcome: Progressing ?  ?Problem: Pain Managment: ?Goal: General experience of comfort will improve ?Outcome: Progressing ?  ?Problem: Safety: ?Goal: Ability to remain free from injury will improve ?Outcome: Progressing ?  ?Problem: Skin Integrity: ?Goal: Risk for impaired skin integrity will decrease ?Outcome: Progressing ?  ?Problem: Education: ?Goal:  Knowledge of disease or condition will improve ?Outcome: Progressing ?Goal: Knowledge of secondary prevention will improve (SELECT ALL) ?Outcome: Progressing ?Goal: Knowledge of patient specific risk factors will improve (INDIVIDUALIZE FOR PATIENT) ?Outcome: Progressing ?Goal: Individualized Educational Video(s) ?Outcome: Progressing ?  ?Problem: Coping: ?Goal: Will verbalize positive feelings about self ?Outcome: Progressing ?Goal: Will identify appropriate support needs ?Outcome: Progressing ?  ?Problem: Health Behavior/Discharge Planning: ?Goal: Ability to manage health-related needs will improve ?Outcome: Progressing ?  ?Problem: Self-Care: ?Goal: Ability to participate in self-care as condition permits will improve ?Outcome: Progressing ?Goal: Verbalization of feelings and concerns over difficulty with self-care will improve ?Outcome: Progressing ?Goal: Ability to communicate needs accurately will improve ?Outcome: Progressing ?  ?Problem: Nutrition: ?Goal: Risk of aspiration will decrease ?Outcome: Progressing ?Goal: Dietary intake will improve ?Outcome: Progressing ?  ?Problem: Intracerebral Hemorrhage Tissue Perfusion: ?Goal: Complications of Intracerebral Hemorrhage will be minimized ?Outcome: Progressing ?  ?

## 2021-06-15 NOTE — Progress Notes (Signed)
OT Cancellation Note ? ?Patient Details ?Name: Micheal Donovan ?MRN: [id number] ?DOB: 14-May-1950 ? ? ?Cancelled Treatment:    Reason Eval/Treat Not Completed: Patient declined, no reason specified. Pt watching TV on OT's entrance, Pt then stated that his headache was too intense to keep his eyes open and requested OT come back at another time. RN informed about headache. OT will follow up as schedule allows.  ? ?Micheal Donovan ?06/15/2021, 4:02 PM ?

## 2021-06-15 NOTE — Plan of Care (Signed)
?  Problem: Education: ?Goal: Knowledge of General Education information will improve ?Description: Including pain rating scale, medication(s)/side effects and non-pharmacologic comfort measures ?Outcome: Progressing ?  ?Problem: Health Behavior/Discharge Planning: ?Goal: Ability to manage health-related needs will improve ?Outcome: Progressing ?  ?Problem: Clinical Measurements: ?Goal: Ability to maintain clinical measurements within normal limits will improve ?Outcome: Progressing ?Goal: Will remain free from infection ?Outcome: Progressing ?Goal: Diagnostic test results will improve ?Outcome: Progressing ?Goal: Respiratory complications will improve ?Outcome: Progressing ?Goal: Cardiovascular complication will be avoided ?Outcome: Progressing ?  ?Problem: Activity: ?Goal: Risk for activity intolerance will decrease ?Outcome: Progressing ?  ?Problem: Nutrition: ?Goal: Adequate nutrition will be maintained ?Outcome: Progressing ?  ?Problem: Coping: ?Goal: Level of anxiety will decrease ?Outcome: Progressing ?  ?Problem: Elimination: ?Goal: Will not experience complications related to bowel motility ?Outcome: Progressing ?Goal: Will not experience complications related to urinary retention ?Outcome: Progressing ?  ?Problem: Pain Managment: ?Goal: General experience of comfort will improve ?Outcome: Progressing ?  ?Problem: Safety: ?Goal: Ability to remain free from injury will improve ?Outcome: Progressing ?  ?Problem: Skin Integrity: ?Goal: Risk for impaired skin integrity will decrease ?Outcome: Progressing ?  ?Problem: Education: ?Goal: Knowledge of disease or condition will improve ?Outcome: Progressing ?Goal: Knowledge of secondary prevention will improve (SELECT ALL) ?Outcome: Progressing ?Goal: Knowledge of patient specific risk factors will improve (INDIVIDUALIZE FOR PATIENT) ?Outcome: Progressing ?Goal: Individualized Educational Video(s) ?Outcome: Progressing ?  ?Problem: Coping: ?Goal: Will verbalize  positive feelings about self ?Outcome: Progressing ?Goal: Will identify appropriate support needs ?Outcome: Progressing ?  ?Problem: Health Behavior/Discharge Planning: ?Goal: Ability to manage health-related needs will improve ?Outcome: Progressing ?  ?Problem: Self-Care: ?Goal: Ability to participate in self-care as condition permits will improve ?Outcome: Progressing ?Goal: Verbalization of feelings and concerns over difficulty with self-care will improve ?Outcome: Progressing ?Goal: Ability to communicate needs accurately will improve ?Outcome: Progressing ?  ?Problem: Nutrition: ?Goal: Risk of aspiration will decrease ?Outcome: Progressing ?Goal: Dietary intake will improve ?Outcome: Progressing ?  ?Problem: Intracerebral Hemorrhage Tissue Perfusion: ?Goal: Complications of Intracerebral Hemorrhage will be minimized ?Outcome: Progressing ?  ?

## 2021-06-15 NOTE — Progress Notes (Signed)
?PROGRESS NOTE ? ? ? Micheal Donovan  MOQ:[id number] DOB: 03/15/1950 DOA: 06/10/2021 ?PCP: [name], MD ? ? ?Chief Complaint  ?Patient presents with  ? Hyperglycemia  ? ? ?Brief Narrative:  ?Micheal Donovan is a 71 y.o. male with medical history significant of T2DM, hypertension who presents to the emergency department due to 1 week onset of generalized weakness, dizziness and headache in the posterior part of head.   ?  ?Patient indicates he has been out of home blood pressure and diabetes medications for months, presented to PCP with notable hyperglycemia, hypertension and profound dehydration and subsequently sent to the hospital for further evaluation and treatment. ?  ?Unfortunately in the ED patient's hyperglycemia was profound requiring insulin drip overnight, patient's headache was followed up with CT head noncontrast which was remarkable for acute to subacute intraparenchymal hemorrhage of the left cerebellum 3.3 x 2.7 x 4.3 cm around 19 mL.  Given these acute findings patient's blood pressure was aggressively controlled, patient is transferred to [place] for closer neurology and neurosurgical follow-up. ? ? ?Assessment & Plan: ?  ?Principal Problem: ?  Intraparenchymal hemorrhage of brain ([place]) ?Active Problems: ?  Essential hypertension ?  Type 2 diabetes mellitus with hyperglycemia (HCC) ?  Pseudohyponatremia ?  Dehydration ?  Leukocytosis ?  Thrombocytosis ?  Hypercalcemia ?  Hyperlipidemia ?  CAP (community acquired pneumonia) ?  Cerebellar bleed ([place]) ?  Pulmonary mass ?  Headache ? ?#1 subacute intraparenchymal hemorrhage of the brain versus hemorrhagic transformation of ischemic stroke/headache ?-Patient noted to have presented with elevated blood sugar levels, noted to have some dizziness and gait ataxia not improving over the past week. ?-CT head done concerning for subacute intraparenchymal hemorrhage involving the left cerebellum of about 19 cc with surrounding low-density  vasogenic edema and mild regional mass effect and partial effacement of fourth ventricle which was patent without evidence of hydrocephalus or herniation. ?-MRI brain done with no significant interval change in size of left cerebellar intraparenchymal hematoma with unchanged regional mass effect and partial effacement of fourth ventricle but no upstream hydrocephalus. ?-2D echo EF 55 to 35%,WSFK, grade 1 diastolic dysfunction, normal right ventricular systolic function, no source of emboli noted. 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  Findings suspected to be incidental in nature and unlikely to be source of left cerebellar hemorrhage.  No other vascular abnormality seen underlying left cerebellar bleed.  Moderate atheromatous stenosis involving the dominant mid right V4 segment.  Patchy consolidative left upper lobe opacity suspicious for pneumonia.  Emphysema. ?-CT chest with concern for primary lung carcinoma. ?-LDL 213, hemoglobin A1c 13.6. ?-Continue Lipitor. ?-BP control with goal systolic blood pressure < 160. ?-PT/OT/ST. ?-Neurology recommending starting aspirin 81 mg daily due to carotid stenosis in 5 to 7 days. ?-Neurology following and I appreciate their input and recommendations. ?-Awaiting placement in CIR. ? ?2.  DKA versus honk/anion gap metabolic acidosis in the setting of uncontrolled diabetes mellitus/hyperglycemia ?-On admission at [facility] patient was placed on the Endo tool/insulin drip and subsequently transitioned off insulin drip. ?-Hemoglobin A1c 13.6. ?-CBG 204 this morning. ?-Increase Semglee to 18 units  daily.  ?-  Continue NovoLog 4 units 3 times daily with meals. ?-SSI. ?-Diabetes coordinator following. ? ?3.  Pulmonary mass noted on chest x-ray +/- pneumonia ?-Chest x-ray done with a 4.4 cm left midlung pulmonary mass concerning for malignancy. ?-CT angiogram head and neck done with some concern for patchy consolidative left upper lobe opacity suspicious for pneumonia. ?-CT chest done this morning 06/13/2021 with a 4.3 x 3.6 x 3.3 cm lobular spiculated mass in the posterior left upper lobe with teetering to the lateral pleural and major fissure with imaging findings consistent with primary bronchogenic neoplasm.  Metastatic left hilar lymphadenopathy.  Peripheral micronodularity posterior right costophrenic sulcus, likely related to infectious/inflammatory etiology and/or aspiration. ?-Urine Legionella antigen negative ?-Urine strep pneumococcus antigen negative. ?-Continue empiric IV Rocephin and azithromycin and treat for total of 5 to 7 days. ?-Place on Claritin, Flonase, Dulera, Mucinex, scheduled DuoNebs. ?-Patient seen in consultation by pulmonary, Dr. Lamonte Sakai who discussed options for tissue diagnosis including bronchoscopy and EBUS however patient wanted to think about his options and then more about the procedure going forward before committing to scheduling it.   ?-Patient today states he is ready and willing to undergo bronchoscopy with EBUS and would like me to inform pulmonary of his decision. ?-Pulmonary informed that patient willing and ready to undergo bronchoscopy and EBUS. ?-Per pulmonary. ?-Supportive care. ? ?4.  Medication noncompliance ?-Dr. Avon Gully discussed with patient at bedside need for medication compliance given his hypertension, concurrent intraparenchymal hemorrhage in DKA. ? ?5.  Pseudohyponatremia ?-Improved with correction of hyperglycemia. ? ?6.  Hypertension ?-Continue Cozaar, Norvasc . ?-If further blood pressure control is needed could uptitrate Norvasc to 10 mg daily. ? ?7.   Hyperglycemia ?-Resolved. ? ?8.  Dehydration ?-Improved with hydration. ? ?9.  Hemoconcentration with notable leukocytosis, thrombocytosis and polycythemia ?Improved with hydration. ? ?10.  Headaches ?-Patient with complaints of headaches despite narcotics.  CT chest with concern for primary lung carcinoma.  Patient started on Medrol Dosepak per neurology which patient states is helping headaches in addition to increased dose of Ultram. ?-Outpatient follow-up. ? ? ?DVT prophylaxis: SCDs ?Code Status: Full ?Family Communication: No family at bedside. ?Disposition: CIR ? ?Status is: Inpatient ?Remains inpatient appropriate because: Severity of illness ?  ?Consultants:  ?Neurology: Dr. Rory Percy 06/11/2021 ?Pulmonary: Dr. Lamonte Sakai 06/13/2021 ? ?Procedures:  ?CT head 06/11/2021 ?CT angiogram head and neck 06/12/2021 ?Chest x-ray 06/12/2021 ?MRI brain 06/12/2021 ?2D echo 06/12/2021 ?CT chest 06/13/2021 ? ? ?Antimicrobials:  ?Anti-infectives (From admission, onward)  ? ? Start     Dose/Rate Route Frequency Ordered Stop  ? 06/12/21 1030  cefTRIAXone (ROCEPHIN) 2 g in sodium chloride 0.9 % 100 mL IVPB       ? 2 g ?200 mL/hr over 30 Minutes Intravenous Every 24 hours 06/12/21 0930 06/17/21 1029  ? 06/12/21 1030  azithromycin (ZITHROMAX) 500 mg in sodium chloride 0.9 % 250 mL IVPB       ? 500 mg ?250 mL/hr over 60 Minutes Intravenous Every 24 hours 06/12/21 0930 06/17/21 1029  ? ?  ?  ? ? ?Subjective: ?Patient sitting up in bed. Feeling better. HA improving, dizziness improving.  Patient stated he wants to have bronchoscopy with biopsy done during this hospitalization.    ? ?Objective: ?Vitals:  ? 06/15/21 0337 06/15/21 0348 06/15/21 0750 06/15/21 1131  ?BP: (!) 158/92  (!) 140/95 (!) 157/91  ?Pulse: 87 93 95 93  ?Resp: 18 20 17 16   ?Temp: 97.7 ?F (36.5 ?C)  97.9 ?F (36.6 ?C) 97.6 ?F (36.4 ?C)  ?  TempSrc: Oral  Oral Oral  ?SpO2: 99% 97% 95% 95%  ?Weight:      ?Height:      ? ? ?Intake/Output Summary (Last 24 hours) at 06/15/2021 1348 ?Last  data filed at 06/15/2021 0900 ?Gross per 24 hour  ?Intake 1155.63 ml  ?Output --  ?Net 1155.63 ml  ? ? ?Filed Weights  ? 06/10/21 2115  ?Weight: 67.2 kg  ? ? ?Examination: ? ?General exam: NAD ?Respiratory

## 2021-06-16 DIAGNOSIS — I614 Nontraumatic intracerebral hemorrhage in cerebellum: Secondary | ICD-10-CM | POA: Diagnosis not present

## 2021-06-16 DIAGNOSIS — I619 Nontraumatic intracerebral hemorrhage, unspecified: Secondary | ICD-10-CM | POA: Diagnosis not present

## 2021-06-16 DIAGNOSIS — E860 Dehydration: Secondary | ICD-10-CM | POA: Diagnosis not present

## 2021-06-16 DIAGNOSIS — J189 Pneumonia, unspecified organism: Secondary | ICD-10-CM | POA: Diagnosis not present

## 2021-06-16 LAB — GLUCOSE, CAPILLARY
Glucose-Capillary: 247 mg/dL — ABNORMAL HIGH (ref 70–99)
Glucose-Capillary: 211 mg/dL — ABNORMAL HIGH (ref 70–99)
Glucose-Capillary: 223 mg/dL — ABNORMAL HIGH (ref 70–99)
Glucose-Capillary: 225 mg/dL — ABNORMAL HIGH (ref 70–99)

## 2021-06-16 MED ORDER — INSULIN GLARGINE-YFGN 100 UNIT/ML ~~LOC~~ SOLN
22.0000 [IU] | Freq: Every day | SUBCUTANEOUS | Status: DC
  Administered 2021-06-16: 22 [IU] via SUBCUTANEOUS
  Filled 2021-06-16 (×2): qty 0.22

## 2021-06-16 MED ORDER — INSULIN ASPART 100 UNIT/ML IJ SOLN
6.0000 [IU] | Freq: Three times a day (TID) | INTRAMUSCULAR | Status: DC
  Administered 2021-06-16 – 2021-06-20 (×7): 6 [IU] via SUBCUTANEOUS

## 2021-06-16 NOTE — Progress Notes (Signed)
?PROGRESS NOTE ? ? ? AYDIEN MAJETTE  MCN:[id number] DOB: March 21, 1950 DOA: 06/10/2021 ?PCP: [name], MD ? ? ?Chief Complaint  ?Patient presents with  ? Hyperglycemia  ? ? ?Brief Narrative:  ?Micheal Donovan is a 71 y.o. male with medical history significant of T2DM, hypertension who presents to the emergency department due to 1 week onset of generalized weakness, dizziness and headache in the posterior part of head.   ?  ?Patient indicates he has been out of home blood pressure and diabetes medications for months, presented to PCP with notable hyperglycemia, hypertension and profound dehydration and subsequently sent to the hospital for further evaluation and treatment. ?  ?Unfortunately in the ED patient's hyperglycemia was profound requiring insulin drip overnight, patient's headache was followed up with CT head noncontrast which was remarkable for acute to subacute intraparenchymal hemorrhage of the left cerebellum 3.3 x 2.7 x 4.3 cm around 19 mL.  Given these acute findings patient's blood pressure was aggressively controlled, patient is transferred to [facility] for closer neurology and neurosurgical follow-up. ? ? ?Assessment & Plan: ?  ?Principal Problem: ?  Intraparenchymal hemorrhage of brain ([place]) ?Active Problems: ?  Essential hypertension ?  Type 2 diabetes mellitus with hyperglycemia (HCC) ?  Pseudohyponatremia ?  Dehydration ?  Leukocytosis ?  Thrombocytosis ?  Hypercalcemia ?  Hyperlipidemia ?  CAP (community acquired pneumonia) ?  Cerebellar bleed ([place]) ?  Pulmonary mass ?  Headache ? ?#1 subacute intraparenchymal hemorrhage of the brain versus hemorrhagic transformation of ischemic stroke/headache ?-Patient noted to have presented with elevated blood sugar levels, noted to have some dizziness and gait ataxia not improving over the past week. ?-CT head done concerning for subacute intraparenchymal hemorrhage involving the left cerebellum of about 19 cc with surrounding low-density  vasogenic edema and mild regional mass effect and partial effacement of fourth ventricle which was patent without evidence of hydrocephalus or herniation. ?-MRI brain done with no significant interval change in size of left cerebellar intraparenchymal hematoma with unchanged regional mass effect and partial effacement of fourth ventricle but no upstream hydrocephalus. ?-2D echo EF 55 to 62%,HUTM, grade 1 diastolic dysfunction, normal right ventricular systolic function, no source of emboli noted. 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  Findings suspected to be incidental in nature and unlikely to be source of left cerebellar hemorrhage.  No other vascular abnormality seen underlying left cerebellar bleed.  Moderate atheromatous stenosis involving the dominant mid right V4 segment.  Patchy consolidative left upper lobe opacity suspicious for pneumonia.  Emphysema. ?-CT chest with concern for primary lung carcinoma. ?-LDL 213, hemoglobin A1c 13.6. ?-Continue Lipitor. ?-BP control with goal systolic blood pressure < 160. ?-PT/OT/ST. ?-Neurology recommending starting aspirin 81 mg daily due to carotid stenosis in 5 to 7 days. ?-Neurology following and I appreciate their input and recommendations. ?-Awaiting placement in CIR. ? ?2.  DKA versus honk/anion gap metabolic acidosis in the setting of uncontrolled diabetes mellitus/hyperglycemia ?-On admission at [facility] patient was placed on the Endo tool/insulin drip and subsequently transitioned off insulin drip. ?-Hemoglobin A1c 13.6. ?-CBG 247 this morning. ?-Increase Semglee to 22 units  daily.  ?-  Increase NovoLog 6 units 3 times daily with meals. ?-SSI. ?-Diabetes coordinator following. ? ?3.  Pulmonary mass noted on chest x-ray +/- pneumonia ?-Chest x-ray done with a 4.4 cm left midlung pulmonary mass concerning for malignancy. ?-CT angiogram head and neck done with some concern for patchy consolidative left upper lobe opacity suspicious for pneumonia. ?-CT chest done this morning 06/13/2021 with a 4.3 x 3.6 x 3.3 cm lobular spiculated mass in the posterior left upper lobe with teetering to the lateral pleural and major fissure with imaging findings consistent with primary bronchogenic neoplasm.  Metastatic left hilar lymphadenopathy.  Peripheral micronodularity posterior right costophrenic sulcus, likely related to infectious/inflammatory etiology and/or aspiration. ?-Urine Legionella antigen negative ?-Urine strep pneumococcus antigen negative. ?-Continue empiric IV Rocephin and azithromycin and treat for total of 5 to 7 days. ?-Place on Claritin, Flonase, Dulera, Mucinex, scheduled DuoNebs. ?-Patient seen in consultation by pulmonary, Dr. Lamonte Sakai who discussed options for tissue diagnosis including bronchoscopy and EBUS however patient wanted to think about his options and then more about the procedure going forward before committing to scheduling it.   ?-Patient today states he is ready and willing to undergo bronchoscopy with EBUS and would like me to inform pulmonary of his decision. ?-Pulmonary informed that patient willing and ready to undergo bronchoscopy and EBUS. ?-Per pulmonary. ?-Supportive care. ? ?4.  Medication noncompliance ?-Dr. Avon Gully discussed with patient at bedside need for medication compliance given his hypertension, concurrent intraparenchymal hemorrhage in DKA. ? ?5.  Pseudohyponatremia ?-Improved with correction of hyperglycemia. ? ?6.  Hypertension ?-Continue Cozaar. ?-Increase Norvasc to 10 mg daily. ? ?7.  Hyperglycemia ?-See above, diabetes. ? ?8.   Dehydration ?-Improved with hydration. ? ?9.  Hemoconcentration with notable leukocytosis, thrombocytosis and polycythemia ?Improved with hydration. ? ?10.  Headaches ?-Patient with complaints of headaches despite narcotics early on in the hospitalization.  CT chest with concern for primary lung carcinoma.  Patient started on Medrol Dosepak per neurology which patient states is helping headaches in addition to increased dose of Ultram. ?-Outpatient follow-up. ? ? ?DVT prophylaxis: SCDs ?Code Status: Full ?Family Communication: No family at bedside. ?Disposition: CIR ? ?Status is: Inpatient ?Remains inpatient appropriate because: Severity of illness ?  ?Consultants:  ?Neurology: Dr. Rory Percy 06/11/2021 ?Pulmonary: Dr. Lamonte Sakai 06/13/2021 ? ?Procedures:  ?CT head 06/11/2021 ?CT angiogram head and neck 06/12/2021 ?Chest x-ray 06/12/2021 ?MRI brain 06/12/2021 ?2D echo 06/12/2021 ?CT chest 06/13/2021 ? ? ?Antimicrobials:  ?Anti-infectives (From admission, onward)  ? ? Start     Dose/Rate Route Frequency Ordered Stop  ? 06/12/21 1030  cefTRIAXone (ROCEPHIN) 2 g in sodium chloride 0.9 % 100 mL IVPB       ? 2 g ?200 mL/hr over 30 Minutes Intravenous Every 24 hours 06/12/21 0930 06/16/21 1028  ? 06/12/21 1030  azithromycin (ZITHROMAX) 500 mg in sodium chloride 0.9 % 250 mL IVPB       ? 500 mg ?250 mL/hr over 60 Minutes Intravenous Every 24 hours 06/12/21 0930 06/16/21 1100  ? ?  ?  ? ? ?Subjective: ?Laying in bed.  States he is feeling better today.  Feels headache is slowly improving as well as dizziness.  No chest pain.  No shortness of breath.  No bleeding.  Tolerating current diet.  ? ?Objective: ?Vitals:  ? 06/16/21 0817 06/16/21 0830 06/16/21 1214 06/16/21 1215  ?BP: (!) 150/83  (!) 110/97 (!) 110/97  ?Pulse: 92  100 100  ?Resp: 18  18 18   ?Temp: 98 ?F (36.7 ?C)  98.3 ?F (  36.8 ?C) 98.3 ?F (36.8 ?C)  ?TempSrc:    Oral  ?SpO2: 98% 96% 98% 100%  ?Weight:      ?Height:      ? ? ?Intake/Output Summary (Last 24 hours) at 06/16/2021  1406 ?Last data filed at 06/16/2021 1215 ?Gross per 24 hour  ?Intake 170 ml  ?Output 1200 ml  ?Net -1030 ml  ? ? ?Filed Weights  ? 06/10/21 2115  ?Weight: 67.2 kg  ? ? ?Examination: ? ?General exam: NAD ?Respiratory system: Decr

## 2021-06-16 NOTE — Plan of Care (Signed)
Pt is alert oriented x 4. Pt c/o nausea, heartburn last night. Pt has been using urinal as he cant get up or move around much with out becoming dizzy and then nauseated. 0400 pt bed alarm going off. Pt stated he needed to go to the bathroom and refused to used urinal and bed side commode. +2 staff members present with pt as he ambulated to bathroom. Pt tolerated walk to bathroom well and denies any dizziness or nausea. Pt had bowel movement. Pt assisted back to bed +1 with walker. Pt had CHG bath, linens changed. Pt tucked back in bed. Pt suddenly became nauseated. After a few min nausea subsided. Pt resting. Pts nephew was present at bedside.  ? ? ? ?Problem: Education: ?Goal: Knowledge of General Education information will improve ?Description: Including pain rating scale, medication(s)/side effects and non-pharmacologic comfort measures ?Outcome: Progressing ?  ?Problem: Health Behavior/Discharge Planning: ?Goal: Ability to manage health-related needs will improve ?Outcome: Progressing ?  ?Problem: Clinical Measurements: ?Goal: Ability to maintain clinical measurements within normal limits will improve ?Outcome: Progressing ?Goal: Will remain free from infection ?Outcome: Progressing ?Goal: Diagnostic test results will improve ?Outcome: Progressing ?Goal: Respiratory complications will improve ?Outcome: Progressing ?Goal: Cardiovascular complication will be avoided ?Outcome: Progressing ?  ?Problem: Activity: ?Goal: Risk for activity intolerance will decrease ?Outcome: Progressing ?  ?Problem: Nutrition: ?Goal: Adequate nutrition will be maintained ?Outcome: Progressing ?  ?Problem: Coping: ?Goal: Level of anxiety will decrease ?Outcome: Progressing ?  ?Problem: Elimination: ?Goal: Will not experience complications related to bowel motility ?Outcome: Progressing ?Goal: Will not experience complications related to urinary retention ?Outcome: Progressing ?  ?Problem: Pain Managment: ?Goal: General experience of  comfort will improve ?Outcome: Progressing ?  ?Problem: Safety: ?Goal: Ability to remain free from injury will improve ?Outcome: Progressing ?  ?Problem: Skin Integrity: ?Goal: Risk for impaired skin integrity will decrease ?Outcome: Progressing ?  ?Problem: Education: ?Goal: Knowledge of disease or condition will improve ?Outcome: Progressing ?Goal: Knowledge of secondary prevention will improve (SELECT ALL) ?Outcome: Progressing ?Goal: Knowledge of patient specific risk factors will improve (INDIVIDUALIZE FOR PATIENT) ?Outcome: Progressing ?Goal: Individualized Educational Video(s) ?Outcome: Progressing ?  ?Problem: Coping: ?Goal: Will verbalize positive feelings about self ?Outcome: Progressing ?Goal: Will identify appropriate support needs ?Outcome: Progressing ?  ?Problem: Health Behavior/Discharge Planning: ?Goal: Ability to manage health-related needs will improve ?Outcome: Progressing ?  ?Problem: Self-Care: ?Goal: Ability to participate in self-care as condition permits will improve ?Outcome: Progressing ?Goal: Verbalization of feelings and concerns over difficulty with self-care will improve ?Outcome: Progressing ?Goal: Ability to communicate needs accurately will improve ?Outcome: Progressing ?  ?Problem: Nutrition: ?Goal: Risk of aspiration will decrease ?Outcome: Progressing ?Goal: Dietary intake will improve ?Outcome: Progressing ?  ?Problem: Intracerebral Hemorrhage Tissue Perfusion: ?Goal: Complications of Intracerebral Hemorrhage will be minimized ?Outcome: Progressing ?  ?

## 2021-06-17 ENCOUNTER — Inpatient Hospital Stay (HOSPITAL_COMMUNITY): Payer: PPO

## 2021-06-17 ENCOUNTER — Encounter (HOSPITAL_COMMUNITY): Payer: Self-pay | Admitting: Internal Medicine

## 2021-06-17 ENCOUNTER — Encounter (HOSPITAL_COMMUNITY)
Admission: EM | Disposition: A | Discharge status: Rehabilitation Facility | Payer: Self-pay | Source: Home / Self Care | Attending: Internal Medicine

## 2021-06-17 DIAGNOSIS — R590 Localized enlarged lymph nodes: Secondary | ICD-10-CM

## 2021-06-17 DIAGNOSIS — R918 Other nonspecific abnormal finding of lung field: Secondary | ICD-10-CM

## 2021-06-17 DIAGNOSIS — Z7984 Long term (current) use of oral hypoglycemic drugs: Secondary | ICD-10-CM

## 2021-06-17 DIAGNOSIS — E1165 Type 2 diabetes mellitus with hyperglycemia: Secondary | ICD-10-CM

## 2021-06-17 DIAGNOSIS — J189 Pneumonia, unspecified organism: Secondary | ICD-10-CM | POA: Diagnosis not present

## 2021-06-17 DIAGNOSIS — Z87891 Personal history of nicotine dependence: Secondary | ICD-10-CM

## 2021-06-17 DIAGNOSIS — I614 Nontraumatic intracerebral hemorrhage in cerebellum: Secondary | ICD-10-CM | POA: Diagnosis not present

## 2021-06-17 DIAGNOSIS — I619 Nontraumatic intracerebral hemorrhage, unspecified: Secondary | ICD-10-CM | POA: Diagnosis not present

## 2021-06-17 DIAGNOSIS — I10 Essential (primary) hypertension: Secondary | ICD-10-CM

## 2021-06-17 DIAGNOSIS — E860 Dehydration: Secondary | ICD-10-CM | POA: Diagnosis not present

## 2021-06-17 HISTORY — PX: VIDEO BRONCHOSCOPY WITH ENDOBRONCHIAL ULTRASOUND: SHX6177

## 2021-06-17 HISTORY — PX: BRONCHIAL NEEDLE ASPIRATION BIOPSY: SHX5106

## 2021-06-17 LAB — CBC WITH DIFFERENTIAL/PLATELET
Abs Immature Granulocytes: 0.08 10*3/uL — ABNORMAL HIGH (ref 0.00–0.07)
Basophils Absolute: 0 10*3/uL (ref 0.0–0.1)
Basophils Relative: 0 %
Eosinophils Absolute: 0.1 10*3/uL (ref 0.0–0.5)
Eosinophils Relative: 1 %
HCT: 47.1 % (ref 39.0–52.0)
Hemoglobin: 15.9 g/dL (ref 13.0–17.0)
Immature Granulocytes: 1 %
Lymphocytes Relative: 18 %
Lymphs Abs: 2.2 10*3/uL (ref 0.7–4.0)
MCH: 28.8 pg (ref 26.0–34.0)
MCHC: 33.8 g/dL (ref 30.0–36.0)
MCV: 85.2 fL (ref 80.0–100.0)
Monocytes Absolute: 0.7 10*3/uL (ref 0.1–1.0)
Monocytes Relative: 6 %
Neutro Abs: 9.1 10*3/uL — ABNORMAL HIGH (ref 1.7–7.7)
Neutrophils Relative %: 74 %
Platelets: 337 10*3/uL (ref 150–400)
RBC: 5.53 MIL/uL (ref 4.22–5.81)
RDW: 12.9 % (ref 11.5–15.5)
WBC: 12.2 10*3/uL — ABNORMAL HIGH (ref 4.0–10.5)
nRBC: 0 % (ref 0.0–0.2)

## 2021-06-17 LAB — GLUCOSE, CAPILLARY
Glucose-Capillary: 190 mg/dL — ABNORMAL HIGH (ref 70–99)
Glucose-Capillary: 186 mg/dL — ABNORMAL HIGH (ref 70–99)
Glucose-Capillary: 201 mg/dL — ABNORMAL HIGH (ref 70–99)
Glucose-Capillary: 148 mg/dL — ABNORMAL HIGH (ref 70–99)
Glucose-Capillary: 196 mg/dL — ABNORMAL HIGH (ref 70–99)
Glucose-Capillary: 260 mg/dL — ABNORMAL HIGH (ref 70–99)

## 2021-06-17 LAB — BASIC METABOLIC PANEL
Anion gap: 8 (ref 5–15)
BUN: 21 mg/dL (ref 8–23)
CO2: 23 mmol/L (ref 22–32)
Calcium: 9.5 mg/dL (ref 8.9–10.3)
Chloride: 101 mmol/L (ref 98–111)
Creatinine, Ser: 0.72 mg/dL (ref 0.61–1.24)
GFR, Estimated: >60 mL/min (ref >60)
Glucose, Bld: 137 mg/dL — ABNORMAL HIGH (ref 70–99)
Potassium: 4.2 mmol/L (ref 3.5–5.1)
Sodium: 132 mmol/L — ABNORMAL LOW (ref 135–145)

## 2021-06-17 SURGERY — BRONCHOSCOPY, WITH EBUS
Anesthesia: General

## 2021-06-17 MED ORDER — INSULIN GLARGINE-YFGN 100 UNIT/ML ~~LOC~~ SOLN
28.0000 [IU] | Freq: Every day | SUBCUTANEOUS | Status: DC
  Administered 2021-06-17 – 2021-06-20 (×4): 28 [IU] via SUBCUTANEOUS
  Filled 2021-06-17 (×5): qty 0.28

## 2021-06-17 MED ORDER — ROCURONIUM BROMIDE 10 MG/ML (PF) SYRINGE
PREFILLED_SYRINGE | INTRAVENOUS | Status: DC | PRN
  Administered 2021-06-17: 60 mg via INTRAVENOUS

## 2021-06-17 MED ORDER — LABETALOL HCL 5 MG/ML IV SOLN
INTRAVENOUS | Status: AC
  Filled 2021-06-17: qty 4

## 2021-06-17 MED ORDER — PHENYLEPHRINE 40 MCG/ML (10ML) SYRINGE FOR IV PUSH (FOR BLOOD PRESSURE SUPPORT)
PREFILLED_SYRINGE | INTRAVENOUS | Status: DC | PRN
  Administered 2021-06-17 (×3): 80 ug via INTRAVENOUS

## 2021-06-17 MED ORDER — CEFDINIR 300 MG PO CAPS
300.0000 mg | ORAL_CAPSULE | Freq: Two times a day (BID) | ORAL | Status: DC
  Administered 2021-06-17 – 2021-06-18 (×4): 300 mg via ORAL
  Filled 2021-06-17 (×4): qty 1

## 2021-06-17 MED ORDER — LIDOCAINE 2% (20 MG/ML) 5 ML SYRINGE
INTRAMUSCULAR | Status: DC | PRN
  Administered 2021-06-17: 60 mg via INTRAVENOUS

## 2021-06-17 MED ORDER — CHLORHEXIDINE GLUCONATE 0.12 % MT SOLN: 15.0000 mL | Freq: Once | OROMUCOSAL | Status: AC

## 2021-06-17 MED ORDER — DEXAMETHASONE SODIUM PHOSPHATE 10 MG/ML IJ SOLN
INTRAMUSCULAR | Status: DC | PRN
  Administered 2021-06-17: 4 mg via INTRAVENOUS

## 2021-06-17 MED ORDER — LACTATED RINGERS IV SOLN: INTRAVENOUS | Status: DC

## 2021-06-17 MED ORDER — FENTANYL CITRATE (PF) 100 MCG/2ML IJ SOLN: 25.0000 ug | INTRAMUSCULAR | Status: DC | PRN

## 2021-06-17 MED ORDER — LABETALOL HCL 5 MG/ML IV SOLN
5.0000 mg | Freq: Once | INTRAVENOUS | Status: AC
  Administered 2021-06-17: 5 mg via INTRAVENOUS

## 2021-06-17 MED ORDER — SUGAMMADEX SODIUM 200 MG/2ML IV SOLN
INTRAVENOUS | Status: DC | PRN
  Administered 2021-06-17: 200 mg via INTRAVENOUS

## 2021-06-17 MED ORDER — CHLORHEXIDINE GLUCONATE 0.12 % MT SOLN
OROMUCOSAL | Status: AC
  Administered 2021-06-17: 15 mL via OROMUCOSAL
  Filled 2021-06-17: qty 15

## 2021-06-17 MED ORDER — FENTANYL CITRATE (PF) 100 MCG/2ML IJ SOLN
INTRAMUSCULAR | Status: DC | PRN
  Administered 2021-06-17 (×2): 50 ug via INTRAVENOUS

## 2021-06-17 MED ORDER — ONDANSETRON HCL 4 MG/2ML IJ SOLN
INTRAMUSCULAR | Status: DC | PRN
  Administered 2021-06-17: 4 mg via INTRAVENOUS

## 2021-06-17 MED ORDER — PROPOFOL 10 MG/ML IV BOLUS
INTRAVENOUS | Status: DC | PRN
  Administered 2021-06-17: 130 mg via INTRAVENOUS

## 2021-06-17 MED ORDER — LABETALOL HCL 5 MG/ML IV SOLN
2.5000 mg | Freq: Once | INTRAVENOUS | Status: AC
  Administered 2021-06-17: 2.5 mg via INTRAVENOUS

## 2021-06-17 SURGICAL SUPPLY — 35 items
ADAPTER VALVE BIOPSY EBUS (MISCELLANEOUS) IMPLANT
ADPTR VALVE BIOPSY EBUS (MISCELLANEOUS)
BRUSH CYTOL CELLEBRITY 1.5X140 (MISCELLANEOUS) IMPLANT
CANISTER SUCT 3000ML PPV (MISCELLANEOUS) ×3 IMPLANT
CONT SPEC 4OZ CLIKSEAL STRL BL (MISCELLANEOUS) ×3 IMPLANT
COVER BACK TABLE 60X90IN (DRAPES) ×3 IMPLANT
FORCEPS BIOP RJ4 1.8 (CUTTING FORCEPS) IMPLANT
GAUZE SPONGE 4X4 12PLY STRL (GAUZE/BANDAGES/DRESSINGS) ×3 IMPLANT
GLOVE BIO SURGEON STRL SZ7.5 (GLOVE) ×3 IMPLANT
GOWN STRL REUS W/ TWL LRG LVL3 (GOWN DISPOSABLE) ×2 IMPLANT
GOWN STRL REUS W/ TWL XL LVL3 (GOWN DISPOSABLE) ×2 IMPLANT
GOWN STRL REUS W/TWL LRG LVL3 (GOWN DISPOSABLE) ×3
GOWN STRL REUS W/TWL XL LVL3 (GOWN DISPOSABLE) ×3
KIT CLEAN ENDO COMPLIANCE (KITS) ×6 IMPLANT
KIT TURNOVER KIT B (KITS) ×3 IMPLANT
MARKER SKIN DUAL TIP RULER LAB (MISCELLANEOUS) ×3 IMPLANT
NDL ASPIRATION VIZISHOT 19G (NEEDLE) IMPLANT
NDL ASPIRATION VIZISHOT 21G (NEEDLE) IMPLANT
NEEDLE ASPIRATION VIZISHOT 19G (NEEDLE) IMPLANT
NEEDLE ASPIRATION VIZISHOT 21G (NEEDLE) IMPLANT
NS IRRIG 1000ML POUR BTL (IV SOLUTION) ×3 IMPLANT
OIL SILICONE PENTAX (PARTS (SERVICE/REPAIRS)) ×3 IMPLANT
PAD ARMBOARD 7.5X6 YLW CONV (MISCELLANEOUS) ×6 IMPLANT
SYR 20ML ECCENTRIC (SYRINGE) ×6 IMPLANT
SYR 20ML LL LF (SYRINGE) ×6 IMPLANT
SYR 50ML SLIP (SYRINGE) IMPLANT
SYR 5ML LUER SLIP (SYRINGE) ×3 IMPLANT
TOWEL GREEN STERILE FF (TOWEL DISPOSABLE) ×3 IMPLANT
TRAP SPECIMEN MUCOUS 40CC (MISCELLANEOUS) IMPLANT
TUBE CONNECTING 20X1/4 (TUBING) ×6 IMPLANT
UNDERPAD 30X30 (UNDERPADS AND DIAPERS) ×3 IMPLANT
VALVE BIOPSY  SINGLE USE (MISCELLANEOUS) ×3
VALVE BIOPSY SINGLE USE (MISCELLANEOUS) ×2 IMPLANT
VALVE SUCTION BRONCHIO DISP (MISCELLANEOUS) ×3 IMPLANT
WATER STERILE IRR 1000ML POUR (IV SOLUTION) ×3 IMPLANT

## 2021-06-17 NOTE — Consult Note (Signed)
? ?NAME:  Micheal Donovan, MRN:  [id number], DOB:  1950-11-25, LOS: 6 ?ADMISSION DATE:  06/10/2021, CONSULTATION DATE:  06/13/2021 ?REFERRING MD:  Dr. Grandville Silos, CHIEF COMPLAINT:  weakness/HA/ dizziness  ? ?History of Present Illness:  ? ?71 year old male with prior history of hypertension, HLD and uncontrolled type 2 diabetes who presented on 4/10 with complaints of one week history of weakness, gait ataxia, dizziness, and posterior headache.  Reportedly out of medications for months.  Thought he had a sinus infection therefore went to his PCP and found to be dehydrated, hyperglycemic, and hypertensive, therefore he sent to ER.  On evaluation, found to have acute to subacute intraparenchymal hemorrhage of the left cerebellum with surrounding vasogenic edema with mild regional mass effect and partial effacement of the fourth ventricle.  He had normal blood pressures on admit.  He was admitted to [facility] and placed on insulin gtt for mild DKA with Neurology consulting for IPH.  Additionally found on CXR to have 4.4 cm left mid lung pulmonary mass concerning for malignancy.  Underwent chest CT with contrast for further evaluation which showed a 4.3 x 3.6 x 3.3 cm lobular spiculated mass in the posterior left upper lobe with tethering to the lateral pleura and major fissure with metastatic left hilar lymphadenopathy.  Pulmonary consulted for further evaluation. ? ?Patient reports 30lb unintentional weight loss over the last 3 months.  Reports new non-productive cough for several weeks but denies any sputum production or hemoptysis.  Denies any SOB.  He is retired from working in a cigarette factory.  Former smoker, quit in 1990.  Smoked for 25 years, 1 pack per day at his heaviness.  ? ?Pertinent  Medical History  ?HTN, DMT2, HLD, BPH, melanoma, ED, diabetic retinopathy ?Former smoker- quit   ? ?Significant Hospital Events: ?Including procedures, antibiotic start and stop dates in addition to other pertinent events   ?4/11  admitted to [facility], neurology consulting ?4/13 Pulmonary consulted ? ?4/13 CT chest w/contrast >> ?1. 4.3 x 3.6 x 3.3 cm lobular spiculated mass in the posterior left upper lobe with tethering to the lateral pleura and major fissure.  Imaging features are consistent with primary bronchogenic neoplasm. ?2. Metastatic left hilar lymphadenopathy. ?3. Peripheral micro nodularity posterior right costophrenic sulcus, likely related to infectious/inflammatory etiology and/or aspiration. ?4. Aortic Atherosclerosis  ? ?Interim History / Subjective:  ?Currently c/o of nausea and headache ? ?Objective   ?Blood pressure (!) 162/93, pulse 94, temperature 97.8 ?F (36.6 ?C), temperature source Oral, resp. rate 18, height $RemoveBe'5\' 7"'nxeGVumfQ$  (1.702 m), weight 67.2 kg, SpO2 94 %. ?   ?   ? ?Intake/Output Summary (Last 24 hours) at 06/17/2021 1010 ?Last data filed at 06/16/2021 1215 ?Gross per 24 hour  ?Intake --  ?Output 400 ml  ?Net -400 ml  ? ? ?Filed Weights  ? 06/10/21 2115  ?Weight: 67.2 kg  ? ? ?Examination: ?General:  Adult male sitting in bedside recliner in no distress- but appears tired/ fatigued wanting to get back into bed.   ?HEENT: MM pink/moist ?Neuro:  Keeps eyes closed during conversation, but oriented to person, place, month and year.  MAE- no appreciated weakness, speech clear ?CV: rr ?PULM:  non labored, clear, no wheeze ?GI: soft,+bs ?Extremities: warm/dry, no LE edema  ? ? ?Resolved Hospital Problem list   ? ?Assessment & Plan:  ? ?Left upper lobe spiculated mass with left hilar lymphadenopathy- c/w primary lung cancer ?Former smoker ?Subacute intraparenchymal hemorrhage involving the left cerebellum- no evidence this  is a met based on MRI read ?DKA resolved ? ?- Patient and wife now agreeable to tissue sampling ?- Discussed risks and benefits of bronch/EBUS/GA, they are agreeable to proceed, scheduled for 145PM ?- Will need OP MedOnc evaluation ? ?Best Practice (right click and "Reselect all SmartList Selections" daily)  ?Per  primary ? ? ?

## 2021-06-17 NOTE — Progress Notes (Signed)
Occupational Therapy Treatment ?Patient Details ?Name: Micheal Donovan ?MRN: [id number] ?DOB: 06/12/1950 ?Today's Date: 06/17/2021 ? ? ?History of present illness Micheal Donovan is a 71 y.o. male who presented to [facility] for evaluation of ongoing headache, dizziness and gait ataxia. CT revealed acute to subacute intraparenchymal hemorrhage involving the left  cerebellum. Past medical history of uncontrolled diabetes, hypertension, hyperlipidemia, retinopathy from diabetes, based on carotid ultrasound 2014-bilateral ICA stenosis 60 to 79% ?  ?OT comments ? Pt seen earlier by PT and continued to complain of nausea. Began education on VOR exercises to help with habituation in order to progress with mobility and ADL tasks.Feel pt is appropriate for CIR, however may need sessions spaced out initially during the day. Acute OT to continue to follow.   ? ?Recommendations for follow up therapy are one component of a multi-disciplinary discharge planning process, led by the attending physician.  Recommendations may be updated based on patient status, additional functional criteria and insurance authorization. ?   ?Follow Up Recommendations ? Acute inpatient rehab (3hours/day)  ?  ?Assistance Recommended at Discharge Intermittent Supervision/Assistance  ?Patient can return home with the following ? A little help with walking and/or transfers;A little help with bathing/dressing/bathroom;Assistance with cooking/housework;Direct supervision/assist for medications management;Direct supervision/assist for financial management;Assist for transportation ?  ?Equipment Recommendations ? None recommended by OT  ?  ?Recommendations for Other Services Rehab consult ? ?  ?Precautions / Restrictions Precautions ?Precautions: Fall ?Precaution Comments: Dizzy  ? ? ?  ? ?Mobility Bed Mobility ?  ? Able to roll side to side; educated on keeping gaze fixed on target ?  ?  ?  ?  ?  ?  ?  ? ?Transfers ?  ?  ?  ?  ?  ?  ?  ?  ?  ?General  transfer comment: declined ?  ?  ?Balance   ?  ?  ?  ?  ?  ?  ?  ?  ?  ?  ?  ?  ?  ?  ?  ?  ?  ?  ?   ? ?ADL either performed or assessed with clinical judgement  ? ?ADL   ?  ?  ?Grooming: Set up;Cueing for UE precautions ?  ?  ?  ?Lower Body Bathing: Moderate assistance ?  ?  ?  ?Lower Body Dressing: Moderate assistance ?  ?  ?  ?  ?  ?  ?  ?Functional mobility during ADLs:  (did not tolerate at this time) ?General ADL Comments: increased dizziness with head movement; unable to complete figure four positioning at baseline due to hip and knee ROM restrictions/will need to bend forward to complete LB ADL; may benefit from AE ?  ? ?Extremity/Trunk Assessment Upper Extremity Assessment ?RUE Deficits / Details: decreased coordination  however attempting to use functionally ?  ?  ?  ?  ?  ? ?Vision   ?Vision Assessment?: Vision impaired- to be further tested in functional context ?Additional Comments: low vision from retinopathy at baseline ?  ?Perception   ?  ?Praxis   ?  ? ?Cognition Arousal/Alertness: Awake/alert ?Behavior During Therapy: Flat affect ?Overall Cognitive Status:  (will further assess) ?  ?  ?  ?  ?  ?  ?  ?  ?  ?  ?  ?  ?  ?  ?  ?  ?  ?  ?  ?   ?Exercises Exercises: Other exercises ?Other Exercises ?Other Exercises: Began educaiton  on VOR exercises with gaze stabilization; smooth pursuits; Pt with mild complains to dizziness with eye movemetn but able to tolerate 10 repetitions; attempted VOR x 1 however unable to tolerate at this time; encouraged tp to attempt ?Other Exercises: Educated on compensatory strategy to maintain visual d=fization on a target during mobility ? ?  ?Shoulder Instructions   ? ? ?  ?General Comments    ? ? ?Pertinent Vitals/ Pain       Pain Assessment ?Pain Assessment: Faces ?Faces Pain Scale: Hurts little more ?Pain Location: head ?Pain Descriptors / Indicators: Headache, Discomfort ?Pain Intervention(s): Limited activity within patient's tolerance ? ?Home Living   ?  ?  ?  ?  ?   ?  ?  ?  ?  ?  ?  ?  ?  ?  ?  ?  ?  ?  ? ?  ?Prior Functioning/Environment    ?  ?  ?  ?   ? ?Frequency ? Min 2X/week  ? ? ? ? ?  ?Progress Toward Goals ? ?OT Goals(current goals can now be found in the care plan section) ? Progress towards OT goals: Progressing toward goals ? ?Acute Rehab OT Goals ?Patient Stated Goal: to stop the dizziness/nausea ?OT Goal Formulation: With patient ?Time For Goal Achievement: 06/26/21 ?Potential to Achieve Goals: Good ?ADL Goals ?Pt Will Perform Lower Body Dressing: with modified independence;sit to/from stand ?Pt Will Transfer to Toilet: with modified independence;ambulating ?Additional ADL Goal #1: Pt will demonstrate increased activey tolerance to complete at least 3 ADLs in standing with mod I ?Additional ADL Goal #2: Pt will indep complete IADL medication management task  ?Plan Discharge plan needs to be updated   ? ?Co-evaluation ? ? ?   ?  ?  ?  ?  ? ?  ?AM-PAC OT "6 Clicks" Daily Activity     ?Outcome Measure ? ? Help from another person eating meals?: None ?Help from another person taking care of personal grooming?: A Little ?Help from another person toileting, which includes using toliet, bedpan, or urinal?: A Little ?Help from another person bathing (including washing, rinsing, drying)?: A Lot ?Help from another person to put on and taking off regular upper body clothing?: A Little ?Help from another person to put on and taking off regular lower body clothing?: A Lot ?6 Click Score: 17 ? ?  ?End of Session   ? ?OT Visit Diagnosis: Unsteadiness on feet (R26.81);Other abnormalities of gait and mobility (R26.89);Muscle weakness (generalized) (M62.81);Pain;Dizziness and giddiness (R42) ?  ?Activity Tolerance Other (comment) (limited by feelings of nausea) ?  ?Patient Left in bed;with call bell/phone within reach;with bed alarm set ?  ?Nurse Communication Mobility status;Other (comment) (use of gaze stabilization) ?  ? ?   ? ?Time: 7408-1448 ?OT Time Calculation (min): 16  min ? ?Charges: OT General Charges ?$OT Visit: 1 Visit ?OT Treatments ?$Therapeutic Activity: 8-22 mins ? ?[facility], OT/L  ? ?Acute OT Clinical Specialist ?Acute Rehabilitation Services ?Pager [phone] ?Office [phone]  ? ?Amisadai Woodford,HILLARY ?06/17/2021, 10:46 AM ?

## 2021-06-17 NOTE — Op Note (Signed)
Flexible and EBUS Bronchoscopy Procedure Note ? ?SCOTTIE METAYER  ?[id number]  ?Feb 21, 1951 ? ?Date:06/17/21  ?Time:3:45 PM  ? ?Provider Performing:Shane Badeaux C Tamala Julian  ? ?Procedure: EBUS Bronchoscopy ? ?Indication(s) ?Hilar adenopathy, lung mass ? ?Consent ?Risks of the procedure as well as the alternatives and risks of each were explained to the patient and/or caregiver.  Consent for the procedure was obtained. ? ?Anesthesia ?General Anesthesia ? ? ?Time Out ?Verified patient identification, verified procedure, site/side was marked, verified correct patient position, special equipment/implants available, medications/allergies/relevant history reviewed, required imaging and test results available. ? ? ?Sterile Technique ?Usual hand hygiene, masks, gowns, and gloves were used ? ? ?Procedure Description ?The diagnostic bronchoscope was then removed and the EBUS bronchoscope was advanced into airway with station 11L biopsied and sent for slide, cell block, and/or culture.  The EBUS bronchoscope was removed after assuring no active bleeding from biopsy site. ? ?Findings:  ?No subcarinal adenopathy by EBUS [place] ?Left hilar node sampled x 6, once for slide, 5x for cell block. ? ?Complications/Tolerance ?None; patient tolerated the procedure well. ?Chest X-ray is not needed post procedure. ? ? ?EBL ?Minimal ? ? ?Specimen(s) ?Station 11L ? ?

## 2021-06-17 NOTE — Transfer of Care (Signed)
Immediate Anesthesia Transfer of Care Note ? ?Patient: Micheal Donovan ? ?Procedure(s) Performed: VIDEO BRONCHOSCOPY WITH ENDOBRONCHIAL ULTRASOUND ?BRONCHIAL NEEDLE ASPIRATION BIOPSIES ? ?Patient Location: Endoscopy Unit ? ?Anesthesia Type:General ? ?Level of Consciousness: awake, alert  and oriented ? ?Airway & Oxygen Therapy: Patient Spontanous Breathing and Patient connected to face mask oxygen ? ?Post-op Assessment: Report given to RN and Post -op Vital signs reviewed and stable ? ?Post vital signs: Reviewed and stable ? ?Last Vitals:  ?Vitals Value Taken Time  ?BP 201/98   ?Temp    ?Pulse 104   ?Resp 14   ?SpO2 100%   ? ? ?Last Pain:  ?Vitals:  ? 06/17/21 1306  ?TempSrc: Oral  ?PainSc:   ?   ? ?Patients Stated Pain Goal: 0 (06/14/21 0110) ? ?Complications: No notable events documented. ?

## 2021-06-17 NOTE — Progress Notes (Signed)
?PROGRESS NOTE ? ? ? Micheal Donovan  JTT:[id number] DOB: 08/01/1950 DOA: 06/10/2021 ?PCP: [name], MD ? ? ?Chief Complaint  ?Patient presents with  ? Hyperglycemia  ? ? ?Brief Narrative:  ?Micheal Donovan is a 71 y.o. male with medical history significant of T2DM, hypertension who presents to the emergency department due to 1 week onset of generalized weakness, dizziness and headache in the posterior part of head.   ?  ?Patient indicates he has been out of home blood pressure and diabetes medications for months, presented to PCP with notable hyperglycemia, hypertension and profound dehydration and subsequently sent to the hospital for further evaluation and treatment. ?  ?Unfortunately in the ED patient's hyperglycemia was profound requiring insulin drip overnight, patient's headache was followed up with CT head noncontrast which was remarkable for acute to subacute intraparenchymal hemorrhage of the left cerebellum 3.3 x 2.7 x 4.3 cm around 19 mL.  Given these acute findings patient's blood pressure was aggressively controlled, patient is transferred to [place] for closer neurology and neurosurgical follow-up. ? ? ?Assessment & Plan: ?  ?Principal Problem: ?  Intraparenchymal hemorrhage of brain ([place]) ?Active Problems: ?  Essential hypertension ?  Type 2 diabetes mellitus with hyperglycemia ([facility]) ?  Pseudohyponatremia ?  Dehydration ?  Leukocytosis ?  Thrombocytosis ?  Hypercalcemia ?  Hyperlipidemia ?  CAP (community acquired pneumonia) ?  Cerebellar bleed ([place]) ?  Pulmonary mass ?  Headache ? ?#1 subacute intraparenchymal hemorrhage of the brain versus hemorrhagic transformation of ischemic stroke/headache ?-Patient noted to have presented with elevated blood sugar levels, noted to have some dizziness and gait ataxia not improving over the past week. ?-CT head done concerning for subacute intraparenchymal hemorrhage involving the left cerebellum of about 19 cc with surrounding low-density  vasogenic edema and mild regional mass effect and partial effacement of fourth ventricle which was patent without evidence of hydrocephalus or herniation. ?-MRI brain done with no significant interval change in size of left cerebellar intraparenchymal hematoma with unchanged regional mass effect and partial effacement of fourth ventricle but no upstream hydrocephalus. ?-2D echo EF 55 to 00%,PQZR, grade 1 diastolic dysfunction, normal right ventricular systolic function, no source of emboli noted. 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  Findings suspected to be incidental in nature and unlikely to be source of left cerebellar hemorrhage.  No other vascular abnormality seen underlying left cerebellar bleed.  Moderate atheromatous stenosis involving the dominant mid right V4 segment.  Patchy consolidative left upper lobe opacity suspicious for pneumonia.  Emphysema. ?-CT chest with concern for primary lung carcinoma. ?-LDL 213, hemoglobin A1c 13.6. ?-Continue Lipitor. ?-BP control with goal systolic blood pressure < 160. ?-PT/OT/ST. ?-Neurology recommending starting aspirin 81 mg daily due to carotid stenosis in 5 to 7 days (06/21/2021). ?-Neurology following and I appreciate their input and recommendations. ?-Awaiting placement in CIR. ? ?2.  DKA versus honk/anion gap metabolic acidosis in the setting of uncontrolled diabetes mellitus/hyperglycemia ?-On admission at [facility] patient was placed on the Endo tool/insulin drip and subsequently transitioned off insulin drip. ?-Hemoglobin A1c 13.6. ?-CBG 190 this morning. ?-Increase Semglee  to 28 units daily.  ?-  Continue NovoLog 6 units 3 times daily with meals. ?-SSI. ?-Diabetes coordinator following. ? ?3.  Pulmonary mass noted on chest x-ray +/- pneumonia ?-Chest x-ray done with a 4.4 cm left midlung pulmonary mass concerning for malignancy. ?-CT angiogram head and neck done with some concern for patchy consolidative left upper lobe opacity suspicious for pneumonia. ?-CT chest done this morning 06/13/2021 with a 4.3 x 3.6 x 3.3 cm lobular spiculated mass in the posterior left upper lobe with teetering to the lateral pleural and major fissure with imaging findings consistent with primary bronchogenic neoplasm.  Metastatic left hilar lymphadenopathy.  Peripheral micronodularity posterior right costophrenic sulcus, likely related to infectious/inflammatory etiology and/or aspiration. ?-Urine Legionella antigen negative ?-Urine strep pneumococcus antigen negative. ?-Status post 5 days azithromycin.   ?-Discontinue IV Rocephin and placed on oral Vantin for 2 more days to complete a 7-day course of antibiotic treatment.   ?-Continue Claritin, Flonase, Dulera, Mucinex, scheduled DuoNebs. ?-Patient seen in consultation by pulmonary, Dr. Lamonte Sakai who discussed options for tissue diagnosis including bronchoscopy and EBUS however patient initially wanted to think about his options and then more about the procedure going forward before committing to scheduling it.   ?-Patient noted on 06/15/2021 to be ready and willing to undergo bronchoscopy with EBUS and pulmonary informed.   ?-Patient seen by pulmonary this morning and patient scheduled for bronchoscopy and EBUS this afternoon.  ?-We will need outpatient follow-up with oncology for further evaluation and management. ?-Per pulmonary. ?-Supportive care. ? ?4.  Medication noncompliance ?-Dr. Avon Gully discussed with patient at bedside need for medication compliance given his hypertension, concurrent intraparenchymal hemorrhage in DKA. ? ?5.   Pseudohyponatremia ?-Improved with correction of hyperglycemia. ? ?6.  Hypertension ?-Continue current regimen of Cozaar, Norvasc. ? ?7.  Hyperglycemia ?-See above, diabetes. ? ?8.  Dehydration ?-Improved with hydration. ? ?9.  Hemoconcentration with notable leukocytosis, thrombocytosis and polycythemia ?Improved with hydration ? ?10.  Headaches ?-Patient with complaints of headaches despite narcotics early on in the hospitalization.  CT chest with concern for primary lung carcinoma.  Patient started on Medrol Dosepak per neurology which patient states is helping headaches in addition to increased dose of Ultram. ?-Outpatient follow-up. ? ? ?DVT prophylaxis: SCDs ?Code Status: Full ?Family Communication: No family at bedside. ?Disposition: CIR ? ?Status is: Inpatient ?Remains inpatient appropriate because: Severity of illness ?  ?Consultants:  ?Neurology: Dr. Rory Percy 06/11/2021 ?Pulmonary: Dr. Lamonte Sakai 06/13/2021 ? ?Procedures:  ?CT head 06/11/2021 ?CT angiogram head and neck 06/12/2021 ?Chest x-ray 06/12/2021 ?MRI brain 06/12/2021 ?2D echo 06/12/2021 ?CT chest 06/13/2021 ?Bronchoscopy/EBUS pending 06/18/2018 ? ? ?Antimicrobials:  ?Anti-infectives (From admission, onward)  ? ? Start     Dose/Rate Route Frequency Ordered Stop  ? 06/17/21 1000  cefdinir (OMNICEF) capsule 300 mg       ? 300 mg Oral Every 12 hours 06/17/21 0826 06/19/21 0959  ? 06/12/21 1030  cefTRIAXone (ROCEPHIN) 2 g in sodium chloride 0.9 % 100 mL IVPB       ? 2 g ?200 mL/hr over 30 Minutes Intravenous Every 24 hours 06/12/21 0930 06/16/21 1028  ? 06/12/21 1030  azithromycin (ZITHROMAX) 500 mg in sodium chloride 0.9 % 250 mL IVPB       ? 500 mg ?250 mL/hr over 60 Minutes Intravenous Every 24 hours 06/12/21 0930 06/16/21 1100  ? ?  ?  ? ? ?Subjective: ?In bed.  Overall feeling better.  Headache slowly improving.  Dizziness slowly improving.  No chest pain.  No shortness of breath.  No bleeding.  Awaiting bronchoscopy to be done this afternoon.   ? ?Objective: ?Vitals:  ? 06/17/21 0440 06/17/21 0832 06/17/21 1043 06/17/21 1126  ?BP: (!) 162/93  137/89 (!) 144/85  ?Pulse: 94  87 89  ?Resp: 18  16 16   ?Temp: 97.8 ?F (36.6 ?C)  (!) 97.5 ?F (36.4 ?C) 97.9 ?F (36.6 ?C)  ?TempSrc: Oral  Oral Or

## 2021-06-17 NOTE — Progress Notes (Signed)
Pt returned from procedure at this time.  Alert and oriented.  VSS. Denies any pain.  Telemetry placed on patient and CCMD notified of return.  ?

## 2021-06-17 NOTE — Progress Notes (Signed)
Physical Therapy Treatment ?Patient Details ?Name: Micheal Donovan ?MRN: [id number] ?DOB: [birth year] ?Today's Date: 06/17/2021 ? ? ?History of Present Illness Micheal Donovan is a 71 y.o. male who presented to [facility] for evaluation of ongoing headache, dizziness and gait ataxia. CT revealed acute to subacute intraparenchymal hemorrhage involving the left  cerebellum. Past medical history of uncontrolled diabetes, hypertension, hyperlipidemia, retinopathy from diabetes, based on carotid ultrasound 2014-bilateral ICA stenosis 60 to 79% ? ?  ?PT Comments  ? ? Pt agreeable to therapy and trying to ambulate despite constant "room spinning" sensation. Attempted to work on gaze stabilization t/o session during transfers and ambulation however pt continued to revert to closing of eyes. Pt did ambulate to the hallway with a walker however required 3 standing rest breaks. Pt to benefit from CIR Upon d/c for compensatory vestibular therapy and progress towards independence. Acute PT to cont to follow. ?   ?Recommendations for follow up therapy are one component of a multi-disciplinary discharge planning process, led by the attending physician.  Recommendations may be updated based on patient status, additional functional criteria and insurance authorization. ? ?Follow Up Recommendations ? Acute inpatient rehab (3hours/day) ?  ?  ?Assistance Recommended at Discharge Frequent or constant Supervision/Assistance  ?Patient can return home with the following A little help with walking and/or transfers;A little help with bathing/dressing/bathroom;Assistance with cooking/housework;Direct supervision/assist for financial management;Assist for transportation;Direct supervision/assist for medications management;Help with stairs or ramp for entrance ?  ?Equipment Recommendations ? Other (comment) (defer to next venue)  ?  ?Recommendations for Other Services Rehab consult ? ? ?  ?Precautions / Restrictions Precautions ?Precautions:  Fall ?Precaution Comments: Dizzy ?Restrictions ?Weight Bearing Restrictions: No  ?  ? ?Mobility ? Bed Mobility ?Overal bed mobility: Modified Independent ?  ?  ?  ?  ?  ?  ?General bed mobility comments: impulsively quick, mod I with HOB elevated. attempted to instruct pt on using gaze stabilization however pt maintains eyes closed ?  ? ?Transfers ?Overall transfer level: Needs assistance ?Equipment used: Rolling walker (2 wheels) ?Transfers: Sit to/from Stand, Bed to chair/wheelchair/BSC ?Sit to Stand: Min assist ?  ?  ?  ?  ?  ?General transfer comment: minA to power up and steady, verbal cues for safe hand placement, to use gaze stabilization and slow down as pt is impulsively quick, pt continues with room spinning in standing ?  ? ?Ambulation/Gait ?Ambulation/Gait assistance: Min assist, +2 safety/equipment (2nd person for chair follow) ?Gait Distance (Feet): 10 Feet ?Assistive device: Rolling walker (2 wheels) ?Gait Pattern/deviations: Decreased stride length, Narrow base of support ?Gait velocity: slow ?Gait velocity interpretation: <1.8 ft/sec, indicate of risk for recurrent falls ?  ?General Gait Details: pt would amb about 3 feet and then have to stop and close eyes in attempt to diminish room spinning sensation. pt attempted to use gaze stabilization however reverted to closing eyes, pt had 3 standing rest breaks with eyes closed prior to needing to sit ? ? ?Stairs ?  ?  ?  ?  ?  ? ? ?Wheelchair Mobility ?  ? ?Modified Rankin (Stroke Patients Only) ?Modified Rankin (Stroke Patients Only) ?Pre-Morbid Rankin Score: Slight disability ?Modified Rankin: Moderately severe disability ? ? ?  ?Balance Overall balance assessment: Needs assistance ?Sitting-balance support: Feet supported ?Sitting balance-Leahy Scale: Fair ?Sitting balance - Comments: Able to tolerate sitting EOB with eyes closed ?Postural control: Right lateral lean ?Standing balance support: Bilateral upper extremity supported, During functional  activity ?Standing balance-Leahy Scale: Poor ?  Standing balance comment: Requires RW and min A for standing and dynamic support ?  ?  ?  ?  ?  ?  ?  ?  ?  ?  ?  ?  ? ?  ?Cognition Arousal/Alertness: Awake/alert ?Behavior During Therapy: Flat affect ?Overall Cognitive Status: Impaired/Different from baseline ?  ?  ?  ?  ?  ?  ?  ?  ?  ?  ?  ?  ?  ?  ?  ?  ?General Comments: pt with flat affect and maintaining eyes closed due to constant room spinning. pt aware of medical situation, procedures and tests he has today ?  ?  ? ?  ?Exercises   ? ?  ?General Comments   ?  ?  ? ?Pertinent Vitals/Pain Pain Assessment ?Pain Assessment: 0-10 ?Pain Score: 5  ?Pain Location: posterior base of skull ?Pain Descriptors / Indicators: Headache, Discomfort  ? ? ?Home Living   ?  ?  ?  ?  ?  ?  ?  ?  ?  ?   ?  ?Prior Function    ?  ?  ?   ? ?PT Goals (current goals can now be found in the care plan section) Acute Rehab PT Goals ?Patient Stated Goal: get rid of headache and room spinning ?PT Goal Formulation: With patient/family ?Time For Goal Achievement: 06/27/21 ?Potential to Achieve Goals: Fair ?Progress towards PT goals: Progressing toward goals ? ?  ?Frequency ? ? ? Min 4X/week ? ? ? ?  ?PT Plan Current plan remains appropriate  ? ? ?Co-evaluation   ?  ?  ?  ?  ? ?  ?AM-PAC PT "6 Clicks" Mobility   ?Outcome Measure ? Help needed turning from your back to your side while in a flat bed without using bedrails?: None ?Help needed moving from lying on your back to sitting on the side of a flat bed without using bedrails?: A Little ?Help needed moving to and from a bed to a chair (including a wheelchair)?: A Little ?Help needed standing up from a chair using your arms (e.g., wheelchair or bedside chair)?: A Little ?Help needed to walk in hospital room?: A Lot ?Help needed climbing 3-5 steps with a railing? : Total ?6 Click Score: 16 ? ?  ?End of Session Equipment Utilized During Treatment: Gait belt ?Activity Tolerance: Other (comment)  (limited by constant "room spinning" sensation) ?Patient left: in chair;with call bell/phone within reach;with chair alarm set;with family/visitor present ?Nurse Communication: Mobility status ?PT Visit Diagnosis: Unsteadiness on feet (R26.81);Dizziness and giddiness (R42);Other abnormalities of gait and mobility (R26.89) ?  ? ? ?Time: 8016-5537 ?PT Time Calculation (min) (ACUTE ONLY): 23 min ? ?Charges:  $Gait Training: 8-22 mins ?$Therapeutic Activity: 8-22 mins          ?          ? ?Kittie Plater, PT, DPT ?Acute Rehabilitation Services ?Secure chat preferred ?Office #: [phone] ? ? ? ?Analee Montee M Calix Heinbaugh ?06/17/2021, 1:26 PM ? ?

## 2021-06-17 NOTE — Anesthesia Preprocedure Evaluation (Addendum)
Anesthesia Evaluation  ?Patient identified by MRN, date of birth, ID band ?Patient awake ? ? ? ?Reviewed: ?Allergy & Precautions, NPO status , Patient's Chart, lab work & pertinent test results ? ?Airway ?Mallampati: I ? ?TM Distance: >3 FB ?Neck ROM: Full ? ? ? Dental ? ?(+) Dental Advisory Given, Chipped,  ?  ?Pulmonary ?neg pulmonary ROS, former smoker,  ?  ?Pulmonary exam normal ?breath sounds clear to auscultation ? ? ? ? ? ? Cardiovascular ?hypertension, Pt. on medications ?+ Peripheral Vascular Disease  ?Normal cardiovascular exam ?Rhythm:Regular Rate:Normal ? ?TTE 2023 ?1. Left ventricular ejection fraction, by estimation, is 55 to 60%. The  ?left ventricle has normal function. The left ventricle has no regional  ?wall motion abnormalities. There is mild left ventricular hypertrophy of  ?the basal-septal segment. Left  ?ventricular diastolic parameters are consistent with Grade I diastolic  ?dysfunction (impaired relaxation).  ??2. Right ventricular systolic function is normal. The right ventricular  ?size is normal. Tricuspid regurgitation signal is inadequate for assessing  ?PA pressure.  ??3. The mitral valve is normal in structure. Trivial mitral valve  ?regurgitation. No evidence of mitral stenosis.  ??4. The aortic valve is tricuspid. Aortic valve regurgitation is not  ?visualized. Aortic valve sclerosis/calcification is present, without any  ?evidence of aortic stenosis. Aortic valve Vmax measures 1.06 m/s.  ??5. Aortic dilatation noted. There is borderline dilatation of the aortic  ?root, measuring 37 mm.  ??6. The inferior vena cava is normal in size with greater than 50%  ?respiratory variability, suggesting right atrial pressure of 3 mmHg.  ?  ?Neuro/Psych ? Headaches, CVA (HA, dizziness), Residual Symptoms negative psych ROS  ? GI/Hepatic ?negative GI ROS, Neg liver ROS,   ?Endo/Other  ?diabetes (BG 137), Poorly Controlled, Type 2, Oral Hypoglycemic Agents ?  Renal/GU ?negative Renal ROS  ?negative genitourinary ?  ?Musculoskeletal ?negative musculoskeletal ROS ?(+)  ? Abdominal ?  ?Peds ? Hematology ?negative hematology ROS ?(+)   ?Anesthesia Other Findings ?Presented to OSH on 4/10 with weakness, dizziness, and HA. CT head  remarkable for acute to subacute intraparenchymal hemorrhage of the left cerebellum. Also found to have uncontrolled diabetes requiring insulin gtt. Pulmonary mass incidentally found on CXR.  ? Reproductive/Obstetrics ? ?  ? ? ? ? ? ? ? ? ? ? ? ? ? ?  ?  ? ? ? ? ? ? ?Anesthesia Physical ?Anesthesia Plan ? ?ASA: 3 ? ?Anesthesia Plan: General  ? ?Post-op Pain Management: Minimal or no pain anticipated  ? ?Induction: Intravenous ? ?PONV Risk Score and Plan: 2 and Dexamethasone, Ondansetron and Treatment may vary due to age or medical condition ? ?Airway Management Planned: Oral ETT ? ?Additional Equipment:  ? ?Intra-op Plan:  ? ?Post-operative Plan: Extubation in OR ? ?Informed Consent: I have reviewed the patients History and Physical, chart, labs and discussed the procedure including the risks, benefits and alternatives for the proposed anesthesia with the patient or authorized representative who has indicated his/her understanding and acceptance.  ? ? ? ?Dental advisory given ? ?Plan Discussed with: CRNA ? ?Anesthesia Plan Comments:   ? ? ? ? ? ?Anesthesia Quick Evaluation ? ?

## 2021-06-17 NOTE — Anesthesia Procedure Notes (Signed)
Procedure Name: Intubation ?Date/Time: 06/17/2021 3:03 PM ?Performed by: Genelle Bal, CRNA ?Pre-anesthesia Checklist: Patient identified, Emergency Drugs available, Suction available and Patient being monitored ?Patient Re-evaluated:Patient Re-evaluated prior to induction ?Oxygen Delivery Method: Circle system utilized ?Preoxygenation: Pre-oxygenation with 100% oxygen ?Induction Type: IV induction ?Ventilation: Mask ventilation without difficulty ?Laryngoscope Size: Sabra Heck and 2 ?Grade View: Grade I ?Tube type: Oral ?Tube size: 8.5 mm ?Number of attempts: 1 ?Airway Equipment and Method: Stylet and Oral airway ?Placement Confirmation: ETT inserted through vocal cords under direct vision, positive ETCO2 and breath sounds checked- equal and bilateral ?Secured at: 22 cm ?Tube secured with: Tape ?Dental Injury: Teeth and Oropharynx as per pre-operative assessment  ? ? ? ? ?

## 2021-06-18 ENCOUNTER — Inpatient Hospital Stay (HOSPITAL_COMMUNITY): Payer: PPO

## 2021-06-18 ENCOUNTER — Encounter (HOSPITAL_COMMUNITY): Payer: Self-pay | Admitting: Internal Medicine

## 2021-06-18 DIAGNOSIS — I614 Nontraumatic intracerebral hemorrhage in cerebellum: Secondary | ICD-10-CM | POA: Diagnosis not present

## 2021-06-18 DIAGNOSIS — E860 Dehydration: Secondary | ICD-10-CM | POA: Diagnosis not present

## 2021-06-18 DIAGNOSIS — J189 Pneumonia, unspecified organism: Secondary | ICD-10-CM | POA: Diagnosis not present

## 2021-06-18 DIAGNOSIS — I619 Nontraumatic intracerebral hemorrhage, unspecified: Secondary | ICD-10-CM | POA: Diagnosis not present

## 2021-06-18 LAB — CBC WITH DIFFERENTIAL/PLATELET
Abs Immature Granulocytes: 0.11 10*3/uL — ABNORMAL HIGH (ref 0.00–0.07)
Basophils Absolute: 0 10*3/uL (ref 0.0–0.1)
Basophils Relative: 0 %
Eosinophils Absolute: 0.1 10*3/uL (ref 0.0–0.5)
Eosinophils Relative: 0 %
HCT: 47.8 % (ref 39.0–52.0)
Hemoglobin: 15.8 g/dL (ref 13.0–17.0)
Immature Granulocytes: 1 %
Lymphocytes Relative: 14 %
Lymphs Abs: 1.8 10*3/uL (ref 0.7–4.0)
MCH: 28.6 pg (ref 26.0–34.0)
MCHC: 33.1 g/dL (ref 30.0–36.0)
MCV: 86.4 fL (ref 80.0–100.0)
Monocytes Absolute: 0.7 10*3/uL (ref 0.1–1.0)
Monocytes Relative: 5 %
Neutro Abs: 10.2 10*3/uL — ABNORMAL HIGH (ref 1.7–7.7)
Neutrophils Relative %: 80 %
Platelets: 337 10*3/uL (ref 150–400)
RBC: 5.53 MIL/uL (ref 4.22–5.81)
RDW: 12.9 % (ref 11.5–15.5)
WBC: 12.8 10*3/uL — ABNORMAL HIGH (ref 4.0–10.5)
nRBC: 0 % (ref 0.0–0.2)

## 2021-06-18 LAB — BASIC METABOLIC PANEL
Anion gap: 10 (ref 5–15)
BUN: 22 mg/dL (ref 8–23)
CO2: 24 mmol/L (ref 22–32)
Calcium: 9.6 mg/dL (ref 8.9–10.3)
Chloride: 96 mmol/L — ABNORMAL LOW (ref 98–111)
Creatinine, Ser: 0.73 mg/dL (ref 0.61–1.24)
GFR, Estimated: >60 mL/min (ref >60)
Glucose, Bld: 251 mg/dL — ABNORMAL HIGH (ref 70–99)
Potassium: 4.2 mmol/L (ref 3.5–5.1)
Sodium: 130 mmol/L — ABNORMAL LOW (ref 135–145)

## 2021-06-18 LAB — MAGNESIUM: Magnesium: 1.8 mg/dL (ref 1.7–2.4)

## 2021-06-18 LAB — GLUCOSE, CAPILLARY
Glucose-Capillary: 182 mg/dL — ABNORMAL HIGH (ref 70–99)
Glucose-Capillary: 252 mg/dL — ABNORMAL HIGH (ref 70–99)
Glucose-Capillary: 165 mg/dL — ABNORMAL HIGH (ref 70–99)
Glucose-Capillary: 129 mg/dL — ABNORMAL HIGH (ref 70–99)
Glucose-Capillary: 194 mg/dL — ABNORMAL HIGH (ref 70–99)

## 2021-06-18 MED ORDER — IOHEXOL 9 MG/ML PO SOLN
ORAL | Status: AC
  Administered 2021-06-18 (×2): 500 mL
  Filled 2021-06-18: qty 1000

## 2021-06-18 MED ORDER — IOHEXOL 300 MG/ML  SOLN
100.0000 mL | Freq: Once | INTRAMUSCULAR | Status: AC | PRN
  Administered 2021-06-18: 100 mL via INTRAVENOUS

## 2021-06-18 NOTE — Significant Event (Signed)
TRH night coverage note: ? ?71 yo M admitted with subacute IPH 7 days ago.  During work up also found to have likely primary lung CA.  Pulm consulted. ? ?This evening getting CT AP to look for evidence of metastatic dz with his likely lung CA.  Called by radiology, CT AP is significant for RLL pulmonary embolus, no evidence of RHS. ? ?2d echo ordered ?[place] BLE to look for DVT ?Consider IVC filter if present ?Due to pt being admitted for a brain bleed and having a clot, obviously difficult situation with regards to starting blood thinners ?Discussed with Dr. Duwayne Heck (on call for PCCM): ?Reasonable to not start blood thinners for tonight ?Likely needs full CTA chest for further PE investigation but this can wait till tomorrow (just got IVC dye load already tonight). ?

## 2021-06-18 NOTE — Progress Notes (Signed)
Inpatient Rehab Admissions Coordinator:  ? ?Late entry: stopped by patients room to update.  Will continue to follow for completion of work up prior to possible transfer to CIR.   ? ?Shann Medal, PT, DPT ?Admissions Coordinator ?[phone] ?06/18/21  ?9:15 AM ? ?

## 2021-06-18 NOTE — Progress Notes (Signed)
Patient ordering meals at times that do not line up with typical blood sugar checks and rounding, resulting in some postprandial readings. ?A sign was placed on the door for staff to be notified before all trays are delivered, and the patient was educated to notify staff prior to beginning meals so that preprandial measurements can be taken and accurate glycemic control achieved. Patient endorses understanding. ?

## 2021-06-18 NOTE — H&P (Incomplete)
? ? ?Physical Medicine and Rehabilitation Admission H&P ? ?  ?Chief Complaint  ?Patient presents with  ? Hyperglycemia  ?: ?HPI: Micheal Donovan. Mata is a 71 year old right-handed male with history of type 2 diabetes mellitus with diabetic retinopathy as well as medical noncompliance, hypertension, hyperlipidemia, carotid ultrasound 2014 bilateral ICA stenosis 60 to 79%, quit smoking 30 years ago.  Per chart review patient lives with spouse.  1 level home 3 steps to entry.  Independent prior to admission.  Presented 06/10/2021 to [facility] for progressive headache dizziness and gait ataxia x1 week.[name]  He initially went to see his primary doctor initial thought was sinus headache placed on steroids and antibiotics.  Cranial CT scan showed acute to subacute intraparenchymal hemorrhage involving the left cerebellum measuring 3.3 x 2.7 x 4.3 cm.  Surrounding low-density vasogenic edema with mild regional mass effect.  Partial effacement of the fourth ventricle with no hydrocephalus.  He was transferred to [facility].  CT angiogram head and neck negative for large vessel occlusion.  Bulky calcified plaque about the left carotid bulb/proximal cervical left ICA with associated stenosis of up to 75% by NASCET criteria.  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  Finding was suspected to be incidental in nature.  Chest x-ray showed a 4.4 cm left midlung pulmonary mass concerning for malignancy.  MRI of the brain follow-up 06/12/2021 no significant interval change in size of left cerebellar intraparenchymal hematoma.  Admission chemistries unremarkable except WBC 12,200, hemoglobin 18.1, glucose 393, hemoglobin A1c 13.6, sodium 132, BUN 32.  Patient did require insulin drip in the ED for management of blood sugars.  Echocardiogram with ejection fraction of 55 to 60% no wall motion abnormalities grade 1 diastolic dysfunction.  Neurology follow-up in regards to intraparenchymal hemorrhage close  monitoring of blood pressure.  Recommendations were made to begin aspirin 81 mg daily due to carotid stenosis in 5 to 7 days beginning approximately 06/21/2021.  He was started on a Medrol Dosepak per neurology for his headaches.  In regards to patient's findings of pulmonary mass noted on chest x-ray.  CT of chest completed 06/13/2021 showing 4.3 x 3.6 x 3.3 cm lobular spiculated mass in the posterior left upper lobe with teetering to the lateral pleural and major fissure with imagings consistent with primary bronchogenic neoplasm.  Urine Legionella antigen negative, urine strep pneumococcus antigen negative.  Pulmonary services consulted in regards to pulmonary mass patient initially reluctant and underwent bronchoscopy 06/17/2021 per Dr. Ina Homes showing no subcarinal adenopathy by EBUS [place].  Left hilar node sampled x6 and current plans to follow-up outpatient with oncology services.  During work-up of pulmonary mass CT/AP completed to look for evidence of metastatic disease showing no acute intra-abdominal or pelvic pathology however noting findings of RLL pulmonary embolism no evidence of RHS.  Venous Doppler studies lower extremities negative for DVT.  A follow-up cranial CT scan was completed to establish anticoagulation for follow-up of left cerebellar hemorrhagic conversion showed mild contraction of hematoma no hydrocephalus.  Patient was cleared by neurology service for low-dose intensity heparin intravenously without bolus.  Therapy evaluations completed due to patient decreased functional mobility was admitted for a comprehensive rehab program. ? ?Review of Systems  ?Constitutional:  Negative for chills and fever.  ?HENT:  Negative for hearing loss.   ?Eyes:  Positive for blurred vision. Negative for double vision.  ?Respiratory:  Negative for cough and shortness of breath.   ?Cardiovascular:  Negative for chest pain,  palpitations and leg swelling.  ?Gastrointestinal:  Positive for constipation.  Negative for heartburn, nausea and vomiting.  ?Genitourinary:  Positive for urgency. Negative for dysuria, flank pain and hematuria.  ?Musculoskeletal:  Positive for joint pain and myalgias.  ?Skin:  Negative for rash.  ?Neurological:  Positive for dizziness and headaches.  ?     Gait ataxia x1 week  ?All other systems reviewed and are negative. ?Past Medical History:  ?Diagnosis Date  ? Allergy   ? BPH (benign prostatic hypertrophy)   ? Cancer [facility])   ? Melanoma on Neck    2008  ? Carotid artery occlusion   ? Diabetes mellitus   ? ED (erectile dysfunction)   ? Hyperlipidemia   ? Hypertension   ? Retinopathy due to secondary DM ([place])   ? ?Past Surgical History:  ?Procedure Laterality Date  ? BRONCHIAL NEEDLE ASPIRATION BIOPSY  06/17/2021  ? Procedure: BRONCHIAL NEEDLE ASPIRATION BIOPSIES;  Surgeon: Candee Furbish, MD;  Location: [facility] ENDOSCOPY;  Service: Pulmonary;;  ? MELANOMA EXCISION  2008  ? Left side of neck  ? RADIOLOGY WITH ANESTHESIA N/A 04/05/2020  ? Procedure: MRI SPINE WITOUT CONTRAST;  Surgeon: Radiologist, Medication, MD;  Location: [place];  Service: Radiology;  Laterality: N/A;  ? TONSILLECTOMY    ? VIDEO BRONCHOSCOPY WITH ENDOBRONCHIAL ULTRASOUND N/A 06/17/2021  ? Procedure: VIDEO BRONCHOSCOPY WITH ENDOBRONCHIAL ULTRASOUND;  Surgeon: Candee Furbish, MD;  Location: [facility] ENDOSCOPY;  Service: Pulmonary;  Laterality: N/A;  ? ?Family History  ?Problem Relation Age of Onset  ? COPD Mother   ? Heart disease Father   ? Heart disease Brother   ?     MI at age 31  ? ?Social History:  reports that he quit smoking about 30 years ago. He has never used smokeless tobacco. He reports that he does not drink alcohol and does not use drugs. ?Allergies:  ?Allergies  ?Allergen Reactions  ? Niaspan [Niacin]   ?  FLUSHING  ? ?Medications Prior to Admission  ?Medication Sig Dispense Refill  ? aspirin EC 81 MG tablet Take 81 mg by mouth daily. Swallow whole.    ? omeprazole (PRILOSEC) 20 MG capsule Take 20 mg by mouth daily.    ?  B-D UF III MINI PEN NEEDLES 31G X 5 MM MISC as directed (Patient not taking: Reported on 06/11/2021) 100 each 4  ? Dulaglutide (TRULICITY) 1.5 GM/0.1UU SOPN Inject 1.5 mg into the skin once a week. (Patient not taking: Reported on 06/11/2021) 12 pen 1  ? glucose blood (ONETOUCH ULTRA) test strip USE TWICE DAILY (Patient not taking: Reported on 06/11/2021) 200 strip 2  ? LANTUS SOLOSTAR 100 UNIT/ML Solostar Pen ADMINISTER 28 UNITS UNDER THE SKIN EVERY DAY (Patient not taking: Reported on 06/11/2021) 15 mL 0  ? losartan (COZAAR) 50 MG tablet TAKE 1 TABLET(50 MG) BY MOUTH DAILY (Patient not taking: Reported on 06/11/2021) 30 tablet 0  ? metFORMIN (GLUCOPHAGE) 500 MG tablet Take 1 tablet (500 mg total) by mouth 2 (two) times daily with a meal. (Patient not taking: Reported on 06/10/2021) 180 tablet 1  ? ? ? ? ?Home: ?Home Living ?Family/patient expects to be discharged to:: Private residence ?Living Arrangements: Spouse/significant other ?Available Help at Discharge: Family, Available 24 hours/day ?Type of Home: House ?Home Access: Stairs to enter ?Entrance Stairs-Number of Steps: 3 or 1 in the garage ?Entrance Stairs-Rails: None ?Home Layout: One level ?Bathroom Shower/Tub: Walk-in shower ?Bathroom Toilet: Standard ?Home Equipment: Conservation officer, nature (2 wheels), Shower seat - built  in ?Additional Comments: wife: Collie Siad ? Lives With: Spouse ?  ?Functional History: ?Prior Function ?Prior Level of Function : Needs assist ?Physical Assist : Mobility (physical) ?Mobility (physical): Gait ?Mobility Comments: normally indep without AD, has been using a RW the past few days ?ADLs Comments: indep including IADLs, likes to bass fish (has 3 boats) ? ?Functional Status:  ?Mobility: ?Bed Mobility ?Overal bed mobility: Modified Independent ?General bed mobility comments: impulsively quick, mod I with HOB elevated. verbal cues to use gaze stabilization, pt reported minimal room spinning sensation ?Transfers ?Overall transfer level: Needs  assistance ?Equipment used: Rolling walker (2 wheels) ?Transfers: Sit to/from Stand ?Sit to Stand: Min assist ?Bed to/from chair/wheelchair/BSC transfer type:: Step pivot ?Step pivot transfers: Min assist ?General transfer

## 2021-06-18 NOTE — Progress Notes (Signed)
Physical Therapy Treatment ?Patient Details ?Name: Micheal Donovan ?MRN: [id number] ?DOB: Dec 31, 1950 ?Today's Date: 06/18/2021 ? ? ?History of Present Illness Micheal Donovan is a 71 y.o. male who presented to [facility] for evaluation of ongoing headache, dizziness and gait ataxia. CT revealed acute to subacute intraparenchymal hemorrhage involving the left  cerebellum. Past medical history of uncontrolled diabetes, hypertension, hyperlipidemia, retinopathy from diabetes, based on carotid ultrasound 2014-bilateral ICA stenosis 60 to 79% ? ?  ?PT Comments  ? ? Pt with improved spirits and reports feeling better. Headache gone at rest with minimal room spinning however does worsen with mobility. Focused on gaze stability during mobility, pt with improved transfers and ambulation tolerance. Pt reports VORx1 exercises are helping. Continue to recommend AIR upon d/c for aggressive rehab to achieve safe mod I level of function  for safe transition home with spouse. ?   ?Recommendations for follow up therapy are one component of a multi-disciplinary discharge planning process, led by the attending physician.  Recommendations may be updated based on patient status, additional functional criteria and insurance authorization. ? ?Follow Up Recommendations ? Acute inpatient rehab (3hours/day) ?  ?  ?Assistance Recommended at Discharge Frequent or constant Supervision/Assistance  ?Patient can return home with the following A little help with walking and/or transfers;A little help with bathing/dressing/bathroom;Assistance with cooking/housework;Direct supervision/assist for financial management;Assist for transportation;Direct supervision/assist for medications management;Help with stairs or ramp for entrance ?  ?Equipment Recommendations ? Other (comment) (defer to next venue)  ?  ?Recommendations for Other Services Rehab consult ? ? ?  ?Precautions / Restrictions Precautions ?Precautions: Fall ?Precaution Comments: room  spinning ?Restrictions ?Weight Bearing Restrictions: No  ?  ? ?Mobility ? Bed Mobility ?Overal bed mobility: Modified Independent ?  ?  ?  ?  ?  ?  ?General bed mobility comments: impulsively quick, mod I with HOB elevated. verbal cues to use gaze stabilization, pt reported minimal room spinning sensation ?  ? ?Transfers ?Overall transfer level: Needs assistance ?Equipment used: Rolling walker (2 wheels) ?Transfers: Sit to/from Stand ?Sit to Stand: Min assist ?  ?  ?  ?  ?  ?General transfer comment: minA to power up and steady, verbal cues for safe hand placement, to use gaze stabilization and slow down as pt is impulsively quick,pt reports mild room spinning, not nearly as bad as yesterday pt states ?  ? ?Ambulation/Gait ?Ambulation/Gait assistance: Min assist, +2 safety/equipment (2nd person for chair follow) ?Gait Distance (Feet): 60 Feet (x2) ?Assistive device: Rolling walker (2 wheels) ?Gait Pattern/deviations: Decreased stride length, Narrow base of support ?Gait velocity: slow ?Gait velocity interpretation: <1.31 ft/sec, indicative of household ambulator ?  ?General Gait Details: pt more steady, less ataxic. focused on gaze stabilization instead of looking down at feet, pt continues to require minA for walker management despite increased fluidity of gait pattern due to room spining and needing to focus on non-moving object in front of self not down at the floor ? ? ?Stairs ?  ?  ?  ?  ?  ? ? ?Wheelchair Mobility ?  ? ?Modified Rankin (Stroke Patients Only) ?Modified Rankin (Stroke Patients Only) ?Pre-Morbid Rankin Score: Slight disability ?Modified Rankin: Moderately severe disability ? ? ?  ?Balance Overall balance assessment: Needs assistance ?Sitting-balance support: Feet supported ?Sitting balance-Leahy Scale: Fair ?Sitting balance - Comments: Able to tolerate sitting EOB with eyes closed ?Postural control: Right lateral lean ?Standing balance support: Bilateral upper extremity supported, During  functional activity ?Standing balance-Leahy Scale: Poor ?Standing balance  comment: Requires RW and min A for standing and dynamic support ?  ?  ?  ?  ?  ?  ?  ?  ?  ?  ?  ?  ? ?  ?Cognition Arousal/Alertness: Awake/alert ?Behavior During Therapy: [facility] for tasks assessed/performed ?Overall Cognitive Status: Impaired/Different from baseline ?Area of Impairment: Attention, Following commands, Safety/judgement ?  ?  ?  ?  ?  ?  ?  ?  ?  ?Current Attention Level: Selective ?  ?Following Commands: Follows one step commands consistently, Follows multi-step commands consistently ?Safety/Judgement: Decreased awareness of safety ?  ?  ?General Comments: pt with improved spirits today as pt feels better. pt also very motivated to return home as his wife is on a vent in the ICU at [facility] at this time (went in yesterday) ?  ?  ? ?  ?Exercises Other Exercises ?Other Exercises: went over VORx1 exercises again as pt was doing them incorrectly, pt return demonstrated proper technique ? ?  ?General Comments General comments (skin integrity, edema, etc.): vss ?  ?  ? ?Pertinent Vitals/Pain Pain Assessment ?Pain Assessment: 0-10 ?Pain Score: 5  ?Pain Location: at rest in bed pt without headache, upon mobilizing headache was at 5/10 ?Pain Descriptors / Indicators: Headache, Discomfort  ? ? ?Home Living   ?  ?  ?  ?  ?  ?  ?  ?  ?  ?   ?  ?Prior Function    ?  ?  ?   ? ?PT Goals (current goals can now be found in the care plan section) Acute Rehab PT Goals ?Patient Stated Goal: get out of here, go home ?PT Goal Formulation: With patient/family ?Time For Goal Achievement: 06/27/21 ?Potential to Achieve Goals: Fair ?Progress towards PT goals: Progressing toward goals ? ?  ?Frequency ? ? ? Min 4X/week ? ? ? ?  ?PT Plan Current plan remains appropriate  ? ? ?Co-evaluation   ?  ?  ?  ?  ? ?  ?AM-PAC PT "6 Clicks" Mobility   ?Outcome Measure ? Help needed turning from your back to your side while in a flat bed without using bedrails?:  None ?Help needed moving from lying on your back to sitting on the side of a flat bed without using bedrails?: A Little ?Help needed moving to and from a bed to a chair (including a wheelchair)?: A Little ?Help needed standing up from a chair using your arms (e.g., wheelchair or bedside chair)?: A Little ?Help needed to walk in hospital room?: A Lot ?Help needed climbing 3-5 steps with a railing? : Total ?6 Click Score: 16 ? ?  ?End of Session Equipment Utilized During Treatment: Gait belt ?Activity Tolerance: Other (comment);Patient tolerated treatment well ?Patient left: in chair;with call bell/phone within reach;with chair alarm set;with nursing/sitter in room (oncology NP) ?Nurse Communication: Mobility status ?PT Visit Diagnosis: Unsteadiness on feet (R26.81);Dizziness and giddiness (R42);Other abnormalities of gait and mobility (R26.89) ?  ? ? ?Time: 3818-2993 ?PT Time Calculation (min) (ACUTE ONLY): 23 min ? ?Charges:  $Gait Training: 8-22 mins ?$Therapeutic Exercise: 8-22 mins          ?          ? ?Kittie Plater, PT, DPT ?Acute Rehabilitation Services ?Secure chat preferred ?Office #: [phone] ? ? ? ?Shamere Campas M Kunio Cummiskey ?06/18/2021, 1:57 PM ? ?

## 2021-06-18 NOTE — Progress Notes (Signed)
? ?NAME:  Micheal Donovan, MRN:  [id number], DOB:  1950-12-17, LOS: 7 ?ADMISSION DATE:  06/10/2021, CONSULTATION DATE:  06/13/2021 ?REFERRING MD:  Dr. Grandville Silos, CHIEF COMPLAINT:  weakness/HA/ dizziness  ? ?History of Present Illness:  ? ?71 year old male with prior history of hypertension, HLD and uncontrolled type 2 diabetes who presented on 4/10 with complaints of one week history of weakness, gait ataxia, dizziness, and posterior headache.  Reportedly out of medications for months.  Thought he had a sinus infection therefore went to his PCP and found to be dehydrated, hyperglycemic, and hypertensive, therefore he sent to ER.  On evaluation, found to have acute to subacute intraparenchymal hemorrhage of the left cerebellum with surrounding vasogenic edema with mild regional mass effect and partial effacement of the fourth ventricle.  He had normal blood pressures on admit.  He was admitted to [facility] and placed on insulin gtt for mild DKA with Neurology consulting for IPH.  Additionally found on CXR to have 4.4 cm left mid lung pulmonary mass concerning for malignancy.  Underwent chest CT with contrast for further evaluation which showed a 4.3 x 3.6 x 3.3 cm lobular spiculated mass in the posterior left upper lobe with tethering to the lateral pleura and major fissure with metastatic left hilar lymphadenopathy.  Pulmonary consulted for further evaluation. ? ?Patient reports 30lb unintentional weight loss over the last 3 months.  Reports new non-productive cough for several weeks but denies any sputum production or hemoptysis.  Denies any SOB.  He is retired from working in a cigarette factory.  Former smoker, quit in 1990.  Smoked for 25 years, 1 pack per day at his heaviness.  ? ?Pertinent  Medical History  ?HTN, DMT2, HLD, BPH, melanoma, ED, diabetic retinopathy ?Former smoker- quit   ? ?Significant Hospital Events: ?Including procedures, antibiotic start and stop dates in addition to other pertinent events   ?4/11  admitted to [facility], neurology consulting ?4/13 Pulmonary consulted ? ?4/13 CT chest w/contrast >> ?1. 4.3 x 3.6 x 3.3 cm lobular spiculated mass in the posterior left upper lobe with tethering to the lateral pleura and major fissure.  Imaging features are consistent with primary bronchogenic neoplasm. ?2. Metastatic left hilar lymphadenopathy. ?3. Peripheral micro nodularity posterior right costophrenic sulcus, likely related to infectious/inflammatory etiology and/or aspiration. ?4. Aortic Atherosclerosis  ? ?Interim History / Subjective:  ?No events. ?Breathing comfortably. ?Denies HA, SOB. ?Ongoing mild dizziness. ? ?Objective   ?Blood pressure 129/71, pulse 81, temperature 98 ?F (36.7 ?C), temperature source Oral, resp. rate 16, height $RemoveBe'5\' 7"'XCcAmeTIv$  (1.702 m), weight 67.1 kg, SpO2 98 %. ?   ?   ? ?Intake/Output Summary (Last 24 hours) at 06/18/2021 0852 ?Last data filed at 06/18/2021 0700 ?Gross per 24 hour  ?Intake 550 ml  ?Output --  ?Net 550 ml  ? ? ?Filed Weights  ? 06/10/21 2115 06/17/21 1306  ?Weight: 67.2 kg 67.1 kg  ? ? ?Examination: ?No distress ?Lung sounds diminished on R, clear on L ?Ext warm ?Moves all 4 ext to command ?Aox3 ? ?CBG a little up ? ?Resolved Hospital Problem list   ? ?Assessment & Plan:  ? ?Left upper lobe spiculated mass with left hilar lymphadenopathy- c/w primary lung cancer ?Former smoker ?Subacute intraparenchymal hemorrhage involving the left cerebellum- no evidence this is a met based on MRI read ?DKA resolved, A1c 15% ? ?- Tissue path pending, needs PET but assuming no mets (have we ruled out that the bleed is a met?) would be 2b  disease.  Not a great operative candidate (uncontrolled DM, deconditioning) but will need multidisciplinary discussion regarding.  Will have oncology see, appreciate Dr. Geralyn Flash insight guiding treatment decisions going forward ?- f/u path ?- PCCM will be available as needed ? ?Best Practice (right click and "Reselect all SmartList Selections" daily)  ?Per  primary ? ?Erskine Emery MD PCCM ?

## 2021-06-18 NOTE — Consult Note (Addendum)
[place]  ?Telephone:([phone] [phone]  ? ?MEDICAL ONCOLOGY - INITIAL CONSULTATION ? ?Referral MD: Dr. Irine Seal ? ?Reason for Referral: Lung mass ? ?HPI: [name] is a 71 year old male with a past medical history significant for melanoma which was excised from his left neck in 2008 (pathology results not available to me), type 2 diabetes mellitus and hypertension.  He presented to the emergency department due to a 1 week history of generalized weakness, dizziness, headache.  The patient had run out of his diabetes and blood pressure medications in January.  Admission lab work showed a WBC of 12.2, hemoglobin 8.1, platelets 463,000, BUN 31, sodium 130, calcium 10.8.  Hemoglobin A1c this admission was 13.6.  CT of the head without contrast was performed due to dizziness which showed acute to subacute intraparenchymal hemorrhage involving the left cerebellum measuring approximately 3.3 x 2.7 x 4.3 cm with vasogenic edema.  MRI of the brain with and without contrast on 06/12/2021 showed no significant interval change in the size of the left cerebellar intraparenchymal hematoma with unchanged regional mass effect and partial effacement of the fourth ventricle but no upstream hydrocephalus.  There was no discernible enhancement to suggest underlying mass lesion although acute blood products could mask enhancement. CT chest with contrast showed a 4.3 x 3.6 x 3.3 cm lobular spiculated mass in the posterior left upper lobe with tethering to the lateral pleura and major fissure.  He was also found to have metastatic left hilar lymphadenopathy.  He underwent EBUS on 06/17/2021.  Cytology pending.  The patient will be discharging to CIR once medically stable. ? ?I met with the patient in his hospital room.  No family at the bedside.  He had just finished working with physical therapy at the time my visit.  He is sitting up in the recliner.  He reports ongoing headaches but overall they are  improving.  He still has some intermittent dizziness.  Reports a cough with sputum production but no hemoptysis.  He reports that his appetite has been good but he has lost weight recently.  He attributes his weight loss to his uncontrolled diabetes.  He is not having any fevers, chills, chest pain, shortness of breath, abdominal pain, nausea, vomiting, bleeding.  Denies fevers and chills.  The patient is married.  He has 1 daughter.  Denies alcohol use.  He states that he quit smoking about 30 years ago.  He is not sure how much he used to smoke as he used to work for [employer] and had access to free cigarettes while at work.  Family history significant for a brother who had head neck cancer and a maternal uncle who had cancer (he is not sure what kind of cancer).  Medical oncology was asked to see the patient make recommendations regarding his lung mass. ? ?Past Medical History:  ?Diagnosis Date  ? Allergy   ? BPH (benign prostatic hypertrophy)   ? Cancer [facility])   ? Melanoma on Neck    2008  ? Carotid artery occlusion   ? Diabetes mellitus   ? ED (erectile dysfunction)   ? Hyperlipidemia   ? Hypertension   ? Retinopathy due to secondary DM [facility])   ?: ? ? ?Past Surgical History:  ?Procedure Laterality Date  ? MELANOMA EXCISION  2008  ? Left side of neck  ? RADIOLOGY WITH ANESTHESIA N/A 04/05/2020  ? Procedure: MRI SPINE WITOUT CONTRAST;  Surgeon: Radiologist, Medication, MD;  Location: [place];  Service: Radiology;  Laterality: N/A;  ? TONSILLECTOMY    ?: ? ? ?Current Facility-Administered Medications  ?Medication Dose Route Frequency Provider Last Rate Last Admin  ? acetaminophen (TYLENOL) tablet 650 mg  650 mg Oral Q4H PRN Amie Portland, MD   650 mg at 06/15/21 2015  ? Or  ? acetaminophen (TYLENOL) 160 MG/5ML solution 650 mg  650 mg Per Tube Q4H PRN Amie Portland, MD      ? Or  ? acetaminophen (TYLENOL) suppository 650 mg  650 mg Rectal Q4H PRN Amie Portland, MD      ? alum & mag hydroxide-simeth  (MAALOX/MYLANTA) 200-200-20 MG/5ML suspension 30 mL  30 mL Oral Q6H PRN Eugenie Filler, MD   30 mL at 06/15/21 2223  ? And  ? lidocaine (XYLOCAINE) 2 % viscous mouth solution 15 mL  15 mL Oral Q6H PRN Eugenie Filler, MD      ? amLODipine (NORVASC) tablet 10 mg  10 mg Oral Daily Eugenie Filler, MD   10 mg at 06/18/21 6256  ? atorvastatin (LIPITOR) tablet 40 mg  40 mg Oral Daily Eugenie Filler, MD   40 mg at 06/18/21 3893  ? cefdinir (OMNICEF) capsule 300 mg  300 mg Oral Q12H Eugenie Filler, MD   300 mg at 06/18/21 7342  ? dextrose 50 % solution 0-50 mL  0-50 mL Intravenous PRN Adefeso, Oladapo, DO      ? fluticasone (FLONASE) 50 MCG/ACT nasal spray 2 spray  2 spray Each Nare Daily Eugenie Filler, MD   2 spray at 06/18/21 [phone]  ? guaiFENesin (MUCINEX) 12 hr tablet 1,200 mg  1,200 mg Oral BID Eugenie Filler, MD   1,200 mg at 06/18/21 1157  ? hydrALAZINE (APRESOLINE) injection 10 mg  10 mg Intravenous Q4H PRN Little Ishikawa, MD   10 mg at 06/18/21 2620  ? HYDROcodone bit-homatropine (HYCODAN) 5-1.5 MG/5ML syrup 5 mL  5 mL Oral Q6H PRN Eugenie Filler, MD   5 mL at 06/15/21 0357  ? insulin aspart (novoLOG) injection 0-15 Units  0-15 Units Subcutaneous TID WC Little Ishikawa, MD   8 Units at 06/18/21 3559  ? insulin aspart (novoLOG) injection 0-5 Units  0-5 Units Subcutaneous QHS Little Ishikawa, MD   3 Units at 06/17/21 2225  ? insulin aspart (novoLOG) injection 6 Units  6 Units Subcutaneous TID WC Eugenie Filler, MD   6 Units at 06/18/21 [phone]  ? insulin glargine-yfgn (SEMGLEE) injection 28 Units  28 Units Subcutaneous Daily Eugenie Filler, MD   28 Units at 06/18/21 [phone]  ? ipratropium-albuterol (DUONEB) 0.5-2.5 (3) MG/3ML nebulizer solution 3 mL  3 mL Nebulization Q2H PRN Eugenie Filler, MD   3 mL at 06/15/21 0347  ? loratadine (CLARITIN) tablet 10 mg  10 mg Oral Daily Eugenie Filler, MD   10 mg at 06/18/21 3646  ? LORazepam (ATIVAN) injection 0.5 mg  0.5 mg  Intravenous Once Eugenie Filler, MD      ? LORazepam (ATIVAN) tablet 0.5 mg  0.5 mg Oral Once PRN Shela Leff, MD      ? losartan (COZAAR) tablet 50 mg  50 mg Oral Daily Little Ishikawa, MD   50 mg at 06/18/21 8032  ? mometasone-formoterol (DULERA) 200-5 MCG/ACT inhaler 2 puff  2 puff Inhalation BID Eugenie Filler, MD   2 puff at 06/18/21 [phone]  ? pantoprazole (PROTONIX) EC tablet 40 mg  40 mg Oral Q0600  Eugenie Filler, MD   40 mg at 06/18/21 1505  ? promethazine (PHENERGAN) 12.5 mg in sodium chloride 0.9 % 50 mL IVPB  12.5 mg Intravenous Q6H PRN Shela Leff, MD   Stopped at 06/16/21 0159  ? senna-docusate (Senokot-S) tablet 1 tablet  1 tablet Oral BID Amie Portland, MD   1 tablet at 06/18/21 6979  ? traMADol (ULTRAM) tablet 100 mg  100 mg Oral Q6H PRN Eugenie Filler, MD   100 mg at 06/18/21 0802  ? ? ? ? ?Allergies  ?Allergen Reactions  ? Niaspan [Niacin]   ?  FLUSHING  ?: ? ? ?Family History  ?Problem Relation Age of Onset  ? COPD Mother   ? Heart disease Father   ? Heart disease Brother   ?     MI at age 40  ?: ? ? ?Social History  ? ?Socioeconomic History  ? Marital status: Single  ?  Spouse name: Not on file  ? Number of children: Not on file  ? Years of education: Not on file  ? Highest education level: Not on file  ?Occupational History  ? Not on file  ?Tobacco Use  ? Smoking status: Former  ?  Types: Cigarettes  ?  Quit date: 07/21/1990  ?  Years since quitting: 30.9  ? Smokeless tobacco: Never  ?Vaping Use  ? Vaping Use: Never used  ?Substance and Sexual Activity  ? Alcohol use: No  ? Drug use: No  ? Sexual activity: Not on file  ?Other Topics Concern  ? Not on file  ?Social History Narrative  ? Not on file  ? ?Social Determinants of Health  ? ?Financial Resource Strain: Not on file  ?Food Insecurity: Not on file  ?Transportation Needs: Not on file  ?Physical Activity: Not on file  ?Stress: Not on file  ?Social Connections: Not on file  ?Intimate Partner Violence: Not on  file  ?: ? ?Review of Systems: A comprehensive 14 point review of systems was negative except as noted in the HPI. ? ?Exam: ?Patient Vitals for the past 24 hrs: ? BP Temp Temp src Pulse Resp SpO2 Height Weight  ?0

## 2021-06-18 NOTE — Anesthesia Postprocedure Evaluation (Signed)
Anesthesia Post Note ? ?Patient: MANISH RUGGIERO ? ?Procedure(s) Performed: VIDEO BRONCHOSCOPY WITH ENDOBRONCHIAL ULTRASOUND ?BRONCHIAL NEEDLE ASPIRATION BIOPSIES ? ?  ? ?Patient location during evaluation: Endoscopy ?Anesthesia Type: General ?Level of consciousness: awake and alert ?Pain management: pain level controlled ?Vital Signs Assessment: post-procedure vital signs reviewed and stable ?Respiratory status: spontaneous breathing, nonlabored ventilation, respiratory function stable and patient connected to nasal cannula oxygen ?Cardiovascular status: blood pressure returned to baseline and stable ?Postop Assessment: no apparent nausea or vomiting ?Anesthetic complications: no ? ? ?No notable events documented. ? ?Last Vitals:  ?Vitals:  ? 06/17/21 2024 06/18/21 0112  ?BP: 126/75 (!) 148/87  ?Pulse:  81  ?Resp: 20 18  ?Temp: 36.7 ?C 36.8 ?C  ?SpO2: 95% 95%  ?  ?Last Pain:  ?Vitals:  ? 06/18/21 0211  ?TempSrc:   ?PainSc: 0-No pain  ? ? ?  ?  ?  ?  ?  ?  ? ?Aneesh Faller L Annleigh Knueppel ? ? ? ? ?

## 2021-06-18 NOTE — Progress Notes (Signed)
?PROGRESS NOTE ? ? ? Micheal Donovan  BJS:[id number] DOB: 1950-05-17 DOA: 06/10/2021 ?PCP: Micheal Frizzle, MD ? ? ?Chief Complaint  ?Patient presents with  ? Hyperglycemia  ? ? ?Brief Narrative:  ?Micheal Donovan is a 71 y.o. male with medical history significant of T2DM, hypertension who presents to the emergency department due to 1 week onset of generalized weakness, dizziness and headache in the posterior part of head.   ?  ?Patient indicates he has been out of home blood pressure and diabetes medications for months, presented to PCP with notable hyperglycemia, hypertension and profound dehydration and subsequently sent to the hospital for further evaluation and treatment. ?  ?Unfortunately in the ED patient's hyperglycemia was profound requiring insulin drip overnight, patient's headache was followed up with CT head noncontrast which was remarkable for acute to subacute intraparenchymal hemorrhage of the left cerebellum 3.3 x 2.7 x 4.3 cm around 19 mL.  Given these acute findings patient's blood pressure was aggressively controlled, patient is transferred to [place] for closer neurology and neurosurgical follow-up. ? ? ?Assessment & Plan: ?  ?Principal Problem: ?  Intraparenchymal hemorrhage of brain ([place]) ?Active Problems: ?  Essential hypertension ?  Type 2 diabetes mellitus with hyperglycemia (H[facility]) ?  Pseudohyponatremia ?  Dehydration ?  Leukocytosis ?  Thrombocytosis ?  Hypercalcemia ?  Hyperlipidemia ?  CAP (community acquired pneumonia) ?  Cerebellar bleed ([place]) ?  Pulmonary mass ?  Headache ? ?#1 subacute intraparenchymal hemorrhage of the brain versus hemorrhagic transformation of ischemic stroke/headache ?-Patient noted to have presented with elevated blood sugar levels, noted to have some dizziness and gait ataxia not improving over the past week. ?-CT head done concerning for subacute intraparenchymal hemorrhage involving the left cerebellum of about 19 cc with surrounding low-density  vasogenic edema and mild regional mass effect and partial effacement of fourth ventricle which was patent without evidence of hydrocephalus or herniation. ?-MRI brain done with no significant interval change in size of left cerebellar intraparenchymal hematoma with unchanged regional mass effect and partial effacement of fourth ventricle but no upstream hydrocephalus. ?-2D echo EF 55 to 60%,VPXT, grade 1 diastolic dysfunction, normal right ventricular systolic function, no source of emboli noted. 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  Findings suspected to be incidental in nature and unlikely to be source of left cerebellar hemorrhage.  No other vascular abnormality seen underlying left cerebellar bleed.  Moderate atheromatous stenosis involving the dominant mid right V4 segment.  Patchy consolidative left upper lobe opacity suspicious for pneumonia.  Emphysema. ?-CT chest with concern for primary lung carcinoma. ?-LDL 213, hemoglobin A1c 13.6. ?-Continue Lipitor. ?-BP control with goal systolic blood pressure < 160. ?-PT/OT/ST. ?-Neurology recommending starting aspirin 81 mg daily due to carotid stenosis in 5 to 7 days (06/21/2021). ?-Neurology following and I appreciate their input and recommendations. ?-Awaiting placement in CIR. ? ?2.  DKA versus honk/anion gap metabolic acidosis in the setting of uncontrolled diabetes mellitus/hyperglycemia ?-On admission at [facility] patient was placed on the Endo tool/insulin drip and subsequently transitioned off insulin drip. ?-Hemoglobin A1c 13.6. ?-CBG 252 this morning. ?-Continue Semglee  to 28 units daily.  ?-  Continue NovoLog 6 units 3 times daily with meals. ?-SSI. ?-Diabetes coordinator following. ? ?3.  Pulmonary mass noted on chest x-ray +/- pneumonia ?-Chest x-ray done with a 4.4 cm left midlung pulmonary mass concerning for malignancy. ?-CT angiogram head and neck done with some concern for patchy consolidative left upper lobe opacity suspicious for pneumonia. ?-CT chest done this morning 06/13/2021 with a 4.3 x 3.6 x 3.3 cm lobular spiculated mass in the posterior left upper lobe with teetering to the lateral pleural and major fissure with imaging findings consistent with primary bronchogenic neoplasm.  Metastatic left hilar lymphadenopathy.  Peripheral micronodularity posterior right costophrenic sulcus, likely related to infectious/inflammatory etiology and/or aspiration. ?-Urine Legionella antigen negative ?-Urine strep pneumococcus antigen negative. ?-Status post 5 days azithromycin.   ?-Discontinued IV Rocephin and placed on oral Vantin to complete a 7-day course of antibiotic treatment.   ?-Continue Claritin, Flonase, Dulera, Mucinex, scheduled DuoNebs. ?-Patient seen in consultation by pulmonary, Dr. Lamonte Sakai who discussed options for tissue diagnosis including bronchoscopy and EBUS however patient initially wanted to think about his options and then more about the procedure going forward before committing to scheduling it.   ?-Patient noted on 06/15/2021 to be ready and willing to undergo bronchoscopy with EBUS and pulmonary informed.   ?-Patient seen by pulmonary subsequently underwent bronchoscopy and EBUS 06/17/2021 with biopsies obtained and pending.   ?-Oncology consulted per pulmonary and patient seen in consultation by Dr. Lindi Adie. ?-Staging CT abdomen and pelvis ordered for today.   ?-We will likely need PET scan and outpatient follow-up with oncology.   ?-Pulmonary following and appreciate input and recommendations. ? ?4.  Medication noncompliance ?-Dr. Avon Gully discussed with  patient at bedside need for medication compliance given his hypertension, concurrent intraparenchymal hemorrhage in DKA. ? ?5.  Pseudohyponatremia ?-Improved with correction of hyperglycemia. ? ?6.  Hypertension ?-Continue current regimen of Norvasc, Cozaar.  ? ?7.  Hyperglycemia ?-See above, diabetes. ? ?8.  Dehydration ?-Improved with hydration. ? ?9.  Hemoconcentration with notable leukocytosis, thrombocytosis and polycythemia ?Improved with hydration ? ?10.  Headaches ?-Patient with complaints of headaches despite narcotics early on in the hospitalization.  CT chest with concern for primary lung carcinoma.  Patient started on Medrol Dosepak per neurology which patient stated is helping headaches in addition to increased dose of Ultram. ?-Outpatient follow-up. ? ? ?DVT prophylaxis: SCDs ?Code Status: Full ?Family Communication: No family at bedside. ?Disposition: CIR ? ?Status is: Inpatient ?Remains inpatient appropriate because: Severity of illness ?  ?Consultants:  ?Neurology: Dr. Rory Percy 06/11/2021 ?Pulmonary: Dr. Lamonte Sakai 06/13/2021 ?Oncology: Dr. Lindi Adie 06/18/2021 ? ?Procedures:  ?CT head 06/11/2021 ?CT angiogram head and neck 06/12/2021 ?Chest x-ray 06/12/2021 ?MRI brain 06/12/2021 ?2D echo 06/12/2021 ?CT chest 06/13/2021 ?Bronchoscopy/EBUS 06/17/2021 ?CT abdomen and pelvis pending ? ? ?Antimicrobials:  ?Anti-infectives (From admission, onward)  ? ? Start     Dose/Rate Route Frequency Ordered Stop  ? 06/17/21 1000  cefdinir (OMNICEF) capsule 300 mg       ? 300 mg Oral Every 12 hours 06/17/21 0826 06/19/21 0959  ? 06/12/21 1030  cefTRIAXone (ROCEPHIN) 2 g in sodium chloride 0.9 % 100 mL IVPB       ? 2 g ?200 mL/hr over 30 Minutes Intravenous Every 24 hours 06/12/21 0930 06/16/21 1028  ? 06/12/21 1030  azithromycin (ZITHROMAX) 500 mg in sodium chloride 0.9 % 250 mL IVPB       ? 500 mg ?250 mL/hr over 60 Minutes Intravenous Every 24 hours 06/12/21 0930 06/16/21 1100  ? ?  ?  ? ? ?  Subjective: ?Patient feeling better overall.   No chest pain.  No shortness of breath.  No abdominal pain.  Headache slowly improving and currently managed on current regimen.  Dizziness slowly improving.  Tolerating current diet.   ? ?Objective: ?[facility]

## 2021-06-19 ENCOUNTER — Inpatient Hospital Stay (HOSPITAL_COMMUNITY): Payer: PPO

## 2021-06-19 ENCOUNTER — Encounter: Payer: Self-pay | Admitting: *Deleted

## 2021-06-19 DIAGNOSIS — I10 Essential (primary) hypertension: Secondary | ICD-10-CM | POA: Diagnosis not present

## 2021-06-19 DIAGNOSIS — E860 Dehydration: Secondary | ICD-10-CM | POA: Diagnosis not present

## 2021-06-19 DIAGNOSIS — I619 Nontraumatic intracerebral hemorrhage, unspecified: Secondary | ICD-10-CM | POA: Diagnosis not present

## 2021-06-19 DIAGNOSIS — I614 Nontraumatic intracerebral hemorrhage in cerebellum: Secondary | ICD-10-CM | POA: Diagnosis not present

## 2021-06-19 DIAGNOSIS — I700 Atherosclerosis of aorta: Secondary | ICD-10-CM | POA: Diagnosis not present

## 2021-06-19 DIAGNOSIS — G44209 Tension-type headache, unspecified, not intractable: Secondary | ICD-10-CM

## 2021-06-19 DIAGNOSIS — I2699 Other pulmonary embolism without acute cor pulmonale: Secondary | ICD-10-CM

## 2021-06-19 DIAGNOSIS — R918 Other nonspecific abnormal finding of lung field: Secondary | ICD-10-CM

## 2021-06-19 LAB — ECHOCARDIOGRAM COMPLETE
Area-P 1/2: 4.1 cm2
Calc EF: 41.3 %
Height: 67 in
S' Lateral: 3.98 cm
Single Plane A2C EF: 38.3 %
Single Plane A4C EF: 45 %
Weight: 2368 oz

## 2021-06-19 LAB — CBC WITH DIFFERENTIAL/PLATELET
Abs Immature Granulocytes: 0.11 10*3/uL — ABNORMAL HIGH (ref 0.00–0.07)
Basophils Absolute: 0 10*3/uL (ref 0.0–0.1)
Basophils Relative: 0 %
Eosinophils Absolute: 0.3 10*3/uL (ref 0.0–0.5)
Eosinophils Relative: 2 %
HCT: 47 % (ref 39.0–52.0)
Hemoglobin: 16.2 g/dL (ref 13.0–17.0)
Immature Granulocytes: 1 %
Lymphocytes Relative: 20 %
Lymphs Abs: 2.8 10*3/uL (ref 0.7–4.0)
MCH: 29 pg (ref 26.0–34.0)
MCHC: 34.5 g/dL (ref 30.0–36.0)
MCV: 84.2 fL (ref 80.0–100.0)
Monocytes Absolute: 1 10*3/uL (ref 0.1–1.0)
Monocytes Relative: 7 %
Neutro Abs: 9.8 10*3/uL — ABNORMAL HIGH (ref 1.7–7.7)
Neutrophils Relative %: 70 %
Platelets: 317 10*3/uL (ref 150–400)
RBC: 5.58 MIL/uL (ref 4.22–5.81)
RDW: 12.9 % (ref 11.5–15.5)
WBC: 14.1 10*3/uL — ABNORMAL HIGH (ref 4.0–10.5)
nRBC: 0 % (ref 0.0–0.2)

## 2021-06-19 LAB — BASIC METABOLIC PANEL
Anion gap: 7 (ref 5–15)
BUN: 17 mg/dL (ref 8–23)
CO2: 25 mmol/L (ref 22–32)
Calcium: 9.3 mg/dL (ref 8.9–10.3)
Chloride: 98 mmol/L (ref 98–111)
Creatinine, Ser: 0.71 mg/dL (ref 0.61–1.24)
GFR, Estimated: >60 mL/min (ref >60)
Glucose, Bld: 161 mg/dL — ABNORMAL HIGH (ref 70–99)
Potassium: 3.8 mmol/L (ref 3.5–5.1)
Sodium: 130 mmol/L — ABNORMAL LOW (ref 135–145)

## 2021-06-19 LAB — CYTOLOGY - NON PAP

## 2021-06-19 LAB — GLUCOSE, CAPILLARY
Glucose-Capillary: 156 mg/dL — ABNORMAL HIGH (ref 70–99)
Glucose-Capillary: 134 mg/dL — ABNORMAL HIGH (ref 70–99)
Glucose-Capillary: 233 mg/dL — ABNORMAL HIGH (ref 70–99)
Glucose-Capillary: 116 mg/dL — ABNORMAL HIGH (ref 70–99)

## 2021-06-19 MED ORDER — HEPARIN (PORCINE) 25000 UT/250ML-% IV SOLN
900.0000 [IU]/h | INTRAVENOUS | Status: DC
  Administered 2021-06-19: 700 [IU]/h via INTRAVENOUS
  Filled 2021-06-19: qty 250

## 2021-06-19 MED ORDER — BUTALBITAL-APAP-CAFFEINE 50-325-40 MG PO TABS
1.0000 | ORAL_TABLET | Freq: Four times a day (QID) | ORAL | Status: DC | PRN
  Administered 2021-06-20: 1 via ORAL
  Filled 2021-06-19: qty 1

## 2021-06-19 NOTE — Progress Notes (Signed)
Inpatient Rehab Admissions Coordinator:  ? ?Note CT results and pending further imaging.  Will follow for plan. Will not admit to CIR today.  ? ?Shann Medal, PT, DPT ?Admissions Coordinator ?[phone] ?06/19/21  ?1:05 PM ? ?

## 2021-06-19 NOTE — Progress Notes (Signed)
Lower extremity venous has been completed.  ? ?Preliminary results in CV Proc.  ? ?[name] ?06/19/2021 2:01 PM    ?

## 2021-06-19 NOTE — Hospital Course (Addendum)
The patient Micheal Donovan is a 71 y.o. male with medical history significant of T2DM, hypertension who presents to the emergency department due to 1 week onset of generalized weakness, dizziness and headache in the posterior part of head.   ?  ?Patient indicates he has been out of home blood pressure and diabetes medications for months, presented to PCP with notable hyperglycemia, hypertension and profound dehydration and subsequently sent to the hospital for further evaluation and treatment. ?  ?Unfortunately in the ED patient's hyperglycemia was profound requiring insulin drip overnight, patient's headache was followed up with CT head noncontrast which was remarkable for acute to subacute intraparenchymal hemorrhage of the left cerebellum 3.3 x 2.7 x 4.3 cm around 19 mL.  Given these acute findings patient's blood pressure was aggressively controlled, patient is transferred to [place] for closer neurology and neurosurgical follow-up. ? ?**Interim History  ?He was worked up for his subacute intraparenchymal hemorrhage of the brain versus hemorrhagic transformation of his ischemic stroke and headache and neurology recommended starting aspirin 81 mg due to carotid stenosis in 5 to 7 days.  Subsequently he was found to have a lung mass noted on CT scan and this was worked up and was found to be poorly differentiated non-small cell lung carcinoma, adenocarcinoma and he has no evidence of metastatic disease by CT scan but does have an acute PE noted.  Unfortunately we are unable to anticoagulate patient at this time given his recent intraparenchymal hemorrhage.  We will need to discuss with neurology as well as pulmonary and medical oncology about further anticoagulation recommendations and Neurology recommending starting Heparin gtt for 7 days and then transition to oral Apixaban vs. Lovenox.  PT OT evaluated and recommending CIR and he is stable to D/C to CIR today.  ?

## 2021-06-19 NOTE — Progress Notes (Signed)
Oncology Nurse Navigator Documentation ? ? ?  06/19/2021  ?  1:00 PM 06/19/2021  ? 10:00 AM  ?Oncology Nurse Navigator Flowsheets  ?Navigator Follow Up Date:  06/26/2021  ?Navigator Follow Up Reason:  Other:  ?Navigator Location [facility]  ?Referral Date to RadOnc/MedOnc  06/18/2021  ?Navigator Encounter Type Molecular Studies Other:  ?Patient Visit Type Inpatient Inpatient  ?Treatment Phase Pre-Tx/Tx Discussion Pre-Tx/Tx Discussion  ?Barriers/Navigation Needs Coordination of Care/I received an update from pathology dept that path for molecular and PDL 1 testing has gone to [place].   Coordination of Care  ?Interventions Coordination of Care Coordination of Care  ?Acuity Level 2-Minimal Needs (1-2 Barriers Identified) Level 2-Minimal Needs (1-2 Barriers Identified)  ?Coordination of Care Pathology Other  ?Time Spent with Patient 30 30  ?  ?

## 2021-06-19 NOTE — Progress Notes (Signed)
Oncology Nurse Navigator Documentation ? ? ?  06/19/2021  ? 10:00 AM  ?Oncology Nurse Navigator Flowsheets  ?Navigator Follow Up Date: 06/26/2021  ?Navigator Follow Up Reason: Other:  ?Navigator Location [facility]  ?Referral Date to RadOnc/MedOnc 06/18/2021  ?Navigator Encounter Type Other:  ?Patient Visit Type Inpatient  ?Treatment Phase Pre-Tx/Tx Discussion  ?Barriers/Navigation Needs Coordination of Care/I received an update from Dr. Lindi Adie on Micheal Donovan for new referral for Dr. Julien Nordmann. I updated new patient coordinator to call and schedule him to be seen on 5/4 with Dr. Julien Nordmann.   ?Interventions Coordination of Care  ?Acuity Level 2-Minimal Needs (1-2 Barriers Identified)  ?Coordination of Care Other  ?Time Spent with Patient 30  ?  ?

## 2021-06-19 NOTE — Plan of Care (Signed)
Discussed with Dr. Alfredia Ferguson, pt found to have right lower lobe PE. Negative DVT. No right heat strain. He does have left cerebellar hemorrhagic conversion. Last CT 8 days ago. We did repeat CT stat and showed mild contraction of hematoma and no hydrocephalus. I felt Ok to start low intensity heparin IV without bolus (per stroke protocol). Also put in modified NIHSS Q4h for close monitoring. If neuro changes, will do stat CT. Discussed with Dr. Alfredia Ferguson and RN Alver Fisher.  ? ?Rosalin Hawking, MD [name] ?Stroke Neurology ?06/19/2021 ?6:46 PM ? ?

## 2021-06-19 NOTE — Progress Notes (Signed)
Physical Therapy Treatment ?Patient Details ?Name: Micheal Donovan ?MRN: [id number] ?DOB: 02/19/1951 ?Today's Date: 06/19/2021 ? ? ?History of Present Illness Micheal Donovan is a 71 y.o. male who presented to [facility] for evaluation of ongoing headache, dizziness and gait ataxia. CT revealed acute to subacute intraparenchymal hemorrhage involving the left  cerebellum. Past medical history of uncontrolled diabetes, hypertension, hyperlipidemia, retinopathy from diabetes, based on carotid ultrasound 2014-bilateral ICA stenosis 60 to 79% ? ?  ?PT Comments  ? ? Pt with onset of new headache above R eye and worsening room spinning sensation today. Attemped mobility earlier this morning and this afternoon however put unable to tolerate. Pt did agree to VORx1 exercises in bed. Pt with more difficulty turning head to the R than L. After 3 attempts pt with onset of nausea. Pt with noted PE, per Dr. Alfredia Ferguson pt okay to mobilize as pt unable to be started on blood thinners due to bleed. Acute PT to cont to follow. ?   ?Recommendations for follow up therapy are one component of a multi-disciplinary discharge planning process, led by the attending physician.  Recommendations may be updated based on patient status, additional functional criteria and insurance authorization. ? ?Follow Up Recommendations ? Acute inpatient rehab (3hours/day) ?  ?  ?Assistance Recommended at Discharge Frequent or constant Supervision/Assistance  ?Patient can return home with the following A little help with walking and/or transfers;A little help with bathing/dressing/bathroom;Assistance with cooking/housework;Direct supervision/assist for financial management;Assist for transportation;Direct supervision/assist for medications management;Help with stairs or ramp for entrance ?  ?Equipment Recommendations ?  (TBD)  ?  ?Recommendations for Other Services Rehab consult ? ? ?  ?Precautions / Restrictions Precautions ?Precautions: Fall ?Precaution  Comments: pt with new PE however unable to go on blood thinners, per Dr. Alfredia Ferguson okay to mobilize pt ?Restrictions ?Weight Bearing Restrictions: No  ?  ? ?Mobility ? Bed Mobility ?  ?  ?  ?  ?  ?  ?  ?General bed mobility comments: pt deferred due to headache and room spinning ?  ? ?Transfers ?  ?  ?  ?  ?  ?  ?  ?  ?  ?  ?  ? ?Ambulation/Gait ?  ?  ?  ?  ?  ?  ?  ?  ? ? ?Stairs ?  ?  ?  ?  ?  ? ? ?Wheelchair Mobility ?  ? ?Modified Rankin (Stroke Patients Only) ?Modified Rankin (Stroke Patients Only) ?Pre-Morbid Rankin Score: Slight disability ?Modified Rankin: Moderately severe disability ? ? ?  ?Balance   ?  ?  ?  ?  ?  ?  ?  ?  ?  ?  ?  ?  ?  ?  ?  ?  ?  ?  ?  ? ?  ?Cognition Arousal/Alertness: Awake/alert ?Behavior During Therapy: [facility] for tasks assessed/performed ?Overall Cognitive Status: Impaired/Different from baseline ?  ?  ?  ?  ?  ?  ?  ?  ?  ?  ?Current Attention Level: Selective ?  ?Following Commands: Follows one step commands consistently, Follows multi-step commands consistently ?  ?  ?  ?General Comments: pt with depressed spirits today due to regression with headache and worsening room spining in addition to wife conditioning worsening ?  ?  ? ?  ?Exercises Other Exercises ?Other Exercises: pt aggreed to sit up in bed to do VOR x1 exercises. Pt with noted more difficulty turning to the R than the L,  pt able to do 3 rounds of 1 min a piece and then pt became very nauseated ? ?  ?General Comments General comments (skin integrity, edema, etc.): pt with headache above R eye today and worsening room spinning sensation ?  ?  ? ?Pertinent Vitals/Pain Pain Assessment ?Pain Assessment: 0-10 ?Pain Score: 6  ?Pain Location: headache above R eye ?Pain Descriptors / Indicators: Headache  ? ? ?Home Living   ?  ?  ?  ?  ?  ?  ?  ?  ?  ?   ?  ?Prior Function    ?  ?  ?   ? ?PT Goals (current goals can now be found in the care plan section) Progress towards PT goals: Not progressing toward goals - comment (limited  by headache today) ? ?  ?Frequency ? ? ? Min 4X/week ? ? ? ?  ?PT Plan Current plan remains appropriate  ? ? ?Co-evaluation   ?  ?  ?  ?  ? ?  ?AM-PAC PT "6 Clicks" Mobility   ?Outcome Measure ? Help needed turning from your back to your side while in a flat bed without using bedrails?: None ?Help needed moving from lying on your back to sitting on the side of a flat bed without using bedrails?: A Little ?Help needed moving to and from a bed to a chair (including a wheelchair)?: A Little ?Help needed standing up from a chair using your arms (e.g., wheelchair or bedside chair)?: A Little ?Help needed to walk in hospital room?: A Lot ?Help needed climbing 3-5 steps with a railing? : Total ?6 Click Score: 16 ? ?  ?End of Session   ?Activity Tolerance: Patient limited by pain ?Patient left: in chair;with call bell/phone within reach;with bed alarm set ?Nurse Communication: Mobility status ?PT Visit Diagnosis: Unsteadiness on feet (R26.81);Dizziness and giddiness (R42);Other abnormalities of gait and mobility (R26.89) ?  ? ? ?Time: 5947-0761 ?PT Time Calculation (min) (ACUTE ONLY): 19 min ? ?Charges:  $Therapeutic Exercise: 8-22 mins          ?          ? ?Kittie Plater, PT, DPT ?Acute Rehabilitation Services ?Secure chat preferred ?Office #: [phone] ? ? ? ?Disha Cottam M Thressa Shiffer ?06/19/2021, 2:03 PM ? ?

## 2021-06-19 NOTE — Progress Notes (Addendum)
?PROGRESS NOTE ? ? ? [name]  RCB:[id number] DOB: 10/16/1950 DOA: 06/10/2021 ?PCP: [name], MD  ? ?Brief Narrative:  ?The patient Micheal Donovan is a 71 y.o. male with medical history significant of T2DM, hypertension who presents to the emergency department due to 1 week onset of generalized weakness, dizziness and headache in the posterior part of head.   ?  ?Patient indicates he has been out of home blood pressure and diabetes medications for months, presented to PCP with notable hyperglycemia, hypertension and profound dehydration and subsequently sent to the hospital for further evaluation and treatment. ?  ?Unfortunately in the ED patient's hyperglycemia was profound requiring insulin drip overnight, patient's headache was followed up with CT head noncontrast which was remarkable for acute to subacute intraparenchymal hemorrhage of the left cerebellum 3.3 x 2.7 x 4.3 cm around 19 mL.  Given these acute findings patient's blood pressure was aggressively controlled, patient is transferred to [facility] for closer neurology and neurosurgical follow-up. ? ?**Interim History  ?He was worked up for his subacute intraparenchymal hemorrhage of the brain versus hemorrhagic transformation of his ischemic stroke and headache and neurology recommended starting aspirin 81 mg due to carotid stenosis in 5 to 7 days.  Subsequently he was found to have a lung mass noted on CT scan and this was worked up and was found to be poorly differentiated non-small cell lung carcinoma, adenocarcinoma and he has no evidence of metastatic disease by CT scan but does have an acute PE noted.  Unfortunately we are unable to anticoagulate patient at this time given his recent intraparenchymal hemorrhage.  We will need to discuss with neurology as well as pulmonary and medical oncology about further anticoagulation recommendations.  PT OT evaluated and recommending CIR and will be discharged once medically stable. ?   ? ?Assessment and Plan: ? ?Subacute intraparenchymal hemorrhage of the brain versus hemorrhagic transformation of ischemic stroke/headache ?-Patient noted to have presented with elevated blood sugar levels, noted to have some dizziness and gait ataxia not improving over the past week. ?-CT head done concerning for subacute intraparenchymal hemorrhage involving the left cerebellum of about 19 cc with surrounding low-density vasogenic edema and mild regional mass effect and partial effacement of fourth ventricle which was patent without evidence of hydrocephalus or herniation. ?-MRI brain done with no significant interval change in size of left cerebellar intraparenchymal hematoma with unchanged regional mass effect and partial effacement of fourth ventricle but no upstream hydrocephalus. ?-2D echo EF 55 to 80%,HOZY, grade 1 diastolic dysfunction, normal right ventricular systolic function, no source of emboli noted. 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  Findings suspected to be incidental in nature and unlikely to be source of left cerebellar hemorrhage.  No other vascular abnormality seen underlying left cerebellar bleed.  Moderate atheromatous stenosis involving the dominant mid right V4 segment.  Patchy consolidative left upper lobe opacity suspicious for pneumonia.  Emphysema. ?-CT chest with concern for primary lung carcinoma. ?-LDL 213, hemoglobin A1c 13.6. ?-Continue Atorvastatin 40 mg po Daily . ?-BP control with goal systolic blood pressure < 160. ?-PT/OT/SLP recommending CIR. ?-Neurology recommending starting aspirin 81 mg  daily  due to carotid stenosis in 5 to 7 days (06/21/2021). ?-Neurology following and I appreciate their input and recommendations. ?-Awaiting placement in CIR and anticipating D/C in the next 1-2 days. ? ?DKA versus honk/anion gap metabolic acidosis in the setting of uncontrolled diabetes mellitus/hyperglycemia, improved  ?-On admission at [facility] patient was placed on the Endo tool/insulin drip and subsequently transitioned off insulin drip. ?-Hemoglobin A1c 13.6. ?-CBGs ranging from 129-252 ?-Continue Semglee to 28 units daily.  ?-Continue NovoLog 6 units 3 times daily with meals. ?-SSI. ?-Diabetes coordinator following. ? ?Pulmonary mass noted on chest x-ray +/- pneumonia ?Poorly Differentiated Non-Small cell Lung Carcinoma, Adenocarcinoma  ?-Chest x-ray done with a 4.4 cm left midlung pulmonary mass concerning for malignancy. ?-CT angiogram head and neck done with some concern for patchy consolidative left upper lobe opacity suspicious for pneumonia. ?-CT chest done yesterday morning 06/13/2021 with a 4.3 x 3.6 x 3.3 cm lobular spiculated mass in the posterior left upper lobe with teetering to the lateral pleural and major fissure with imaging findings consistent with primary bronchogenic neoplasm.  Metastatic left hilar lymphadenopathy.  Peripheral micronodularity posterior right costophrenic sulcus, likely related to infectious/inflammatory etiology and/or aspiration. ?-Urine Legionella antigen negative ?-Urine strep pneumococcus antigen negative. ?-Status post 5 days azithromycin.   ?-Discontinued IV Rocephin and placed on oral Vantin to complete a 7-day course of antibiotic treatment.   ?-Continue Claritin, Flonase, Dulera, Mucinex, scheduled DuoNebs. ?-Patient seen in consultation by pulmonary, Dr. Lamonte Sakai who discussed options for tissue diagnosis including bronchoscopy and EBUS however patient initially wanted to think about his options and then more about the procedure going forward before committing  to scheduling it.   ?-Patient noted on 06/15/2021 to be ready and willing to undergo bronchoscopy with EBUS and pulmonary informed.   ?-Patient seen by pulmonary subsequently underwent bronchoscopy and EBUS 06/17/2021 with biopsies obtained and showed "Malignant cells present, poorly differentiated non-small cell  ?carcinoma, consistent with adenocarcinoma."  ?-Oncology consulted per pulmonary and patient seen in consultation by Dr. Lindi Adie. ?-Staging CT abdomen and pelvis ordered for yesterday and showed "No acute intra-abdominal or pelvic pathology. Aortic  Atherosclerosis." ?-We will likely need PET scan and outpatient follow-up with oncology and they are ordereding PD-L1 and Foundation 1 Testing as an outpatient and Thoracic Oncology Navigator is arranging outpatient follow-up with Dr. Julien Nordmann  ?-Pulmonary following and appreciate input and recommendations. ? ?Acute PE ?-Unable to safely anticoagulate given Subacute intraparenchymal hemorrhage of the brain versus hemorrhagic transformation of ischemic stroke/headache ?-Likely in the setting of Malignancy  ?-Will discuss with Pulmonary and Oncology; Caryl Pina discussed with neurology and they are recommending repeating stat head CT scan to evaluate his bleeding currently and if stable or improved they are recommending starting a heparin drip for now and then likely transitioning to oral anticoagulation in about 7 days versus Lovenox injections; will need oncology input about this. ?-Checking LE Venous Duplex and showed "No evidence of deep vein thrombosis seen in the lower extremities, bilaterally. No evidence of popliteal cyst, bilaterally." ?-Repeat ECHO done and showed "Right ventricular systolic function is normal. The right ventricular size is normal. " ?-Patient's PESI Class is at least 4 ?-Noted on CT Abdomen and Pelvis yesterday and showed "Right lower lobe pulmonary artery embolus. No evidence of right ?heart straining" ?-Consider obtaining a dedicated CTA of  the chest PE protocol but will not change management given that he has a diagnosed PE ? ?Medication noncompliance ?-Dr. Avon Gully discussed with patient at bedside need for medication  compliance given his

## 2021-06-19 NOTE — Progress Notes (Signed)
Brief oncology note: ? ?Cytology resulted earlier today which showed malignant cells consistent with poorly differentiated non-small cell lung carcinoma, adenocarcinoma.  I notified the patient of the results today.  He has no evidence of metastatic disease by CT scan. ? ?Recommend admission to CIR for rehab and then outpatient follow-up will be arranged with Dr. Julien Nordmann to discuss treatment options for his lung cancer.  No additional work-up or treatment is planned while he is in the hospital.  Recommend outpatient PET scan once discharged from the hospital.  Tissue has been sent for PD-L1 and [facility] One testing.  Thoracic oncology navigator is working on arranging outpatient follow-up. ? ?Mikey Bussing, DNP, AGPCNP-BC, AOCNP ? ? ? ?

## 2021-06-19 NOTE — Progress Notes (Signed)
ANTICOAGULATION CONSULT NOTE - Initial Consult ? ?Pharmacy Consult for IV heparin ?Indication: pulmonary embolus (and s/p CVA with hemorrhagic conversion) ? ?Allergies  ?Allergen Reactions  ? Niaspan [Niacin]   ?  FLUSHING  ? ? ?Patient Measurements: ?Height: 5\' 7"  (170.2 cm) ?Weight: 67.1 kg (148 lb) ?IBW/kg (Calculated) : 66.1 ?Heparin Dosing Weight: 67.1 kg ? ?Vital Signs: ?Temp: 98.5 ?F (36.9 ?C) (04/19 0736) ?Temp Source: Oral (04/19 0736) ?BP: 120/74 (04/19 0736) ?Pulse Rate: 98 (04/19 0736) ? ?Labs: ?Recent Labs  ?  06/17/21 ?0147 06/18/21 ?0859 06/19/21 ?0330  ?HGB 15.9 15.8 16.2  ?HCT 47.1 47.8 47.0  ?PLT 337 337 317  ?CREATININE 0.72 0.73 0.71  ? ? ?Estimated Creatinine Clearance: 80.3 mL/min (by C-G formula based on SCr of 0.71 mg/dL). ? ? ?Medical History: ?Past Medical History:  ?Diagnosis Date  ? Allergy   ? BPH (benign prostatic hypertrophy)   ? [facility])   ? Melanoma on Neck    2008  ? Carotid artery occlusion   ? Diabetes mellitus   ? ED (erectile dysfunction)   ? Hyperlipidemia   ? Hypertension   ? Retinopathy due to secondary DM ([place])   ? ? ?Medications:  ?Infusions:  ? heparin    ? promethazine (PHENERGAN) injection (IM or IVPB) Stopped (06/16/21 0159)  ? ? ?Assessment: ?71 yo male with new PE, also with recent left cerebellar hemorrhagic conversion.  OK for heparin per neurology, low-dose stroke protocol. ? ?Goal of Therapy:  ?Heparin level 0.3-0.5 ?Monitor platelets by anticoagulation protocol: Yes ?  ?Plan:  ?Start IV heparin without bolus at 700 units/hr. ?Check heparin level 8 hrs after gtt starts. ?Daily heparin level and CBC. ? ?[place] Crane, Pharm D, BCPS, BCCP ?Clinical Pharmacist ? 06/19/2021 7:21 PM  ? ?[facility] pharmacy phone numbers are listed on [email or web address] ? ?

## 2021-06-19 NOTE — Progress Notes (Signed)
Per Dr. Julien Nordmann, I updated pathology to send molecular and PDL 1 to [facility].  ?

## 2021-06-20 ENCOUNTER — Other Ambulatory Visit: Payer: Self-pay

## 2021-06-20 ENCOUNTER — Telehealth: Payer: Self-pay | Admitting: Internal Medicine

## 2021-06-20 ENCOUNTER — Encounter (HOSPITAL_COMMUNITY): Payer: Self-pay | Admitting: Physical Medicine and Rehabilitation

## 2021-06-20 ENCOUNTER — Inpatient Hospital Stay (HOSPITAL_COMMUNITY)
Admission: RE | Admit: 2021-06-20 | Discharge: 2021-06-27 | DRG: 057 | Disposition: A | Discharge status: Home Health | Payer: PPO | Source: Intra-hospital | Attending: Physical Medicine and Rehabilitation | Admitting: Physical Medicine and Rehabilitation

## 2021-06-20 DIAGNOSIS — L89150 Pressure ulcer of sacral region, unstageable: Secondary | ICD-10-CM | POA: Diagnosis present

## 2021-06-20 DIAGNOSIS — D72829 Elevated white blood cell count, unspecified: Secondary | ICD-10-CM | POA: Diagnosis not present

## 2021-06-20 DIAGNOSIS — Z79899 Other long term (current) drug therapy: Secondary | ICD-10-CM

## 2021-06-20 DIAGNOSIS — Z825 Family history of asthma and other chronic lower respiratory diseases: Secondary | ICD-10-CM

## 2021-06-20 DIAGNOSIS — Z8249 Family history of ischemic heart disease and other diseases of the circulatory system: Secondary | ICD-10-CM

## 2021-06-20 DIAGNOSIS — I69198 Other sequelae of nontraumatic intracerebral hemorrhage: Secondary | ICD-10-CM | POA: Diagnosis not present

## 2021-06-20 DIAGNOSIS — Y92239 Unspecified place in hospital as the place of occurrence of the external cause: Secondary | ICD-10-CM | POA: Diagnosis not present

## 2021-06-20 DIAGNOSIS — I619 Nontraumatic intracerebral hemorrhage, unspecified: Secondary | ICD-10-CM

## 2021-06-20 DIAGNOSIS — I69193 Ataxia following nontraumatic intracerebral hemorrhage: Secondary | ICD-10-CM | POA: Diagnosis not present

## 2021-06-20 DIAGNOSIS — C3412 Malignant neoplasm of upper lobe, left bronchus or lung: Secondary | ICD-10-CM | POA: Diagnosis not present

## 2021-06-20 DIAGNOSIS — Z85820 Personal history of malignant melanoma of skin: Secondary | ICD-10-CM | POA: Diagnosis not present

## 2021-06-20 DIAGNOSIS — Z741 Need for assistance with personal care: Secondary | ICD-10-CM | POA: Diagnosis not present

## 2021-06-20 DIAGNOSIS — L89611 Pressure ulcer of right heel, stage 1: Secondary | ICD-10-CM | POA: Diagnosis not present

## 2021-06-20 DIAGNOSIS — E1165 Type 2 diabetes mellitus with hyperglycemia: Secondary | ICD-10-CM | POA: Diagnosis not present

## 2021-06-20 DIAGNOSIS — E11319 Type 2 diabetes mellitus with unspecified diabetic retinopathy without macular edema: Secondary | ICD-10-CM | POA: Diagnosis present

## 2021-06-20 DIAGNOSIS — I10 Essential (primary) hypertension: Secondary | ICD-10-CM | POA: Diagnosis present

## 2021-06-20 DIAGNOSIS — Z86711 Personal history of pulmonary embolism: Secondary | ICD-10-CM

## 2021-06-20 DIAGNOSIS — Z87891 Personal history of nicotine dependence: Secondary | ICD-10-CM

## 2021-06-20 DIAGNOSIS — Z888 Allergy status to other drugs, medicaments and biological substances status: Secondary | ICD-10-CM

## 2021-06-20 DIAGNOSIS — T380X5A Adverse effect of glucocorticoids and synthetic analogues, initial encounter: Secondary | ICD-10-CM | POA: Diagnosis not present

## 2021-06-20 DIAGNOSIS — R260 Ataxic gait: Secondary | ICD-10-CM | POA: Diagnosis present

## 2021-06-20 DIAGNOSIS — E785 Hyperlipidemia, unspecified: Secondary | ICD-10-CM | POA: Diagnosis present

## 2021-06-20 DIAGNOSIS — Z794 Long term (current) use of insulin: Secondary | ICD-10-CM | POA: Diagnosis not present

## 2021-06-20 DIAGNOSIS — I614 Nontraumatic intracerebral hemorrhage in cerebellum: Secondary | ICD-10-CM

## 2021-06-20 DIAGNOSIS — R42 Dizziness and giddiness: Secondary | ICD-10-CM | POA: Diagnosis present

## 2021-06-20 DIAGNOSIS — R918 Other nonspecific abnormal finding of lung field: Secondary | ICD-10-CM | POA: Diagnosis not present

## 2021-06-20 DIAGNOSIS — L8990 Pressure ulcer of unspecified site, unspecified stage: Secondary | ICD-10-CM | POA: Insufficient documentation

## 2021-06-20 LAB — GLUCOSE, CAPILLARY
Glucose-Capillary: 232 mg/dL — ABNORMAL HIGH (ref 70–99)
Glucose-Capillary: 192 mg/dL — ABNORMAL HIGH (ref 70–99)
Glucose-Capillary: 93 mg/dL (ref 70–99)
Glucose-Capillary: 97 mg/dL (ref 70–99)
Glucose-Capillary: 176 mg/dL — ABNORMAL HIGH (ref 70–99)

## 2021-06-20 LAB — COMPREHENSIVE METABOLIC PANEL
ALT: 27 U/L (ref 0–44)
AST: 27 U/L (ref 15–41)
Albumin: 3.3 g/dL — ABNORMAL LOW (ref 3.5–5.0)
Alkaline Phosphatase: 120 U/L (ref 38–126)
Anion gap: 10 (ref 5–15)
BUN: 23 mg/dL (ref 8–23)
CO2: 22 mmol/L (ref 22–32)
Calcium: 8.8 mg/dL — ABNORMAL LOW (ref 8.9–10.3)
Chloride: 95 mmol/L — ABNORMAL LOW (ref 98–111)
Creatinine, Ser: 0.71 mg/dL (ref 0.61–1.24)
GFR, Estimated: >60 mL/min (ref >60)
Glucose, Bld: 206 mg/dL — ABNORMAL HIGH (ref 70–99)
Potassium: 4.1 mmol/L (ref 3.5–5.1)
Sodium: 127 mmol/L — ABNORMAL LOW (ref 135–145)
Total Bilirubin: 0.7 mg/dL (ref 0.3–1.2)
Total Protein: 6.2 g/dL — ABNORMAL LOW (ref 6.5–8.1)

## 2021-06-20 LAB — CBC
HCT: 46.2 % (ref 39.0–52.0)
Hemoglobin: 15.4 g/dL (ref 13.0–17.0)
MCH: 28.7 pg (ref 26.0–34.0)
MCHC: 33.3 g/dL (ref 30.0–36.0)
MCV: 86.2 fL (ref 80.0–100.0)
Platelets: 273 K/uL (ref 150–400)
RBC: 5.36 MIL/uL (ref 4.22–5.81)
RDW: 13 % (ref 11.5–15.5)
WBC: 13.1 K/uL — ABNORMAL HIGH (ref 4.0–10.5)
nRBC: 0 % (ref 0.0–0.2)

## 2021-06-20 LAB — PHOSPHORUS: Phosphorus: 3.8 mg/dL (ref 2.5–4.6)

## 2021-06-20 LAB — HEPARIN LEVEL (UNFRACTIONATED)
Heparin Unfractionated: <0.1 [IU]/mL — ABNORMAL LOW (ref 0.30–0.70)
Heparin Unfractionated: 0.22 IU/mL — ABNORMAL LOW (ref 0.30–0.70)
Heparin Unfractionated: 0.25 IU/mL — ABNORMAL LOW (ref 0.30–0.70)

## 2021-06-20 LAB — MAGNESIUM: Magnesium: 2 mg/dL (ref 1.7–2.4)

## 2021-06-20 MED ORDER — TRAMADOL HCL 50 MG PO TABS
100.0000 mg | ORAL_TABLET | Freq: Four times a day (QID) | ORAL | Status: DC | PRN
  Administered 2021-06-21 – 2021-06-27 (×9): 100 mg via ORAL
  Filled 2021-06-20 (×9): qty 2

## 2021-06-20 MED ORDER — METHYLPREDNISOLONE 4 MG PO TBPK
4.0000 mg | ORAL_TABLET | Freq: Four times a day (QID) | ORAL | Status: AC
  Administered 2021-06-20: 4 mg via ORAL

## 2021-06-20 MED ORDER — GUAIFENESIN ER 600 MG PO TB12
1200.0000 mg | ORAL_TABLET | Freq: Two times a day (BID) | ORAL | Status: DC
  Administered 2021-06-20 – 2021-06-27 (×14): 1200 mg via ORAL
  Filled 2021-06-20 (×14): qty 2

## 2021-06-20 MED ORDER — AMLODIPINE BESYLATE 10 MG PO TABS: 10.0000 mg | ORAL_TABLET | Freq: Every day | ORAL | Status: DC

## 2021-06-20 MED ORDER — LORATADINE 10 MG PO TABS: 10.0000 mg | ORAL_TABLET | Freq: Every day | ORAL | Status: DC

## 2021-06-20 MED ORDER — FLUTICASONE PROPIONATE 50 MCG/ACT NA SUSP
2.0000 | Freq: Two times a day (BID) | NASAL | Status: DC
  Administered 2021-06-20 – 2021-06-21 (×2): 2 via NASAL

## 2021-06-20 MED ORDER — ATORVASTATIN CALCIUM 40 MG PO TABS: 40.0000 mg | ORAL_TABLET | Freq: Every day | ORAL | Status: DC

## 2021-06-20 MED ORDER — CEFDINIR 300 MG PO CAPS
300.0000 mg | ORAL_CAPSULE | Freq: Two times a day (BID) | ORAL | Status: AC
  Administered 2021-06-20 (×2): 300 mg via ORAL
  Filled 2021-06-20 (×2): qty 1

## 2021-06-20 MED ORDER — SENNOSIDES-DOCUSATE SODIUM 8.6-50 MG PO TABS
1.0000 | ORAL_TABLET | Freq: Two times a day (BID) | ORAL | Status: DC
  Administered 2021-06-21 – 2021-06-22 (×2): 1 via ORAL
  Filled 2021-06-20 (×13): qty 1

## 2021-06-20 MED ORDER — SENNOSIDES-DOCUSATE SODIUM 8.6-50 MG PO TABS: 1.0000 | ORAL_TABLET | Freq: Two times a day (BID) | ORAL | Status: DC

## 2021-06-20 MED ORDER — AMLODIPINE BESYLATE 10 MG PO TABS
10.0000 mg | ORAL_TABLET | Freq: Every day | ORAL | Status: DC
  Administered 2021-06-21 – 2021-06-27 (×7): 10 mg via ORAL
  Filled 2021-06-20 (×7): qty 1

## 2021-06-20 MED ORDER — MOMETASONE FURO-FORMOTEROL FUM 200-5 MCG/ACT IN AERO
2.0000 | INHALATION_SPRAY | Freq: Two times a day (BID) | RESPIRATORY_TRACT | Status: DC
  Administered 2021-06-20 – 2021-06-27 (×14): 2 via RESPIRATORY_TRACT
  Filled 2021-06-20: qty 8.8

## 2021-06-20 MED ORDER — ACETAMINOPHEN 325 MG PO TABS
650.0000 mg | ORAL_TABLET | ORAL | Status: DC | PRN
Start: 2021-06-20 — End: 2021-06-27
  Administered 2021-06-25: 650 mg via ORAL
  Filled 2021-06-20 (×2): qty 2

## 2021-06-20 MED ORDER — ACETAMINOPHEN 650 MG RE SUPP: 650.0000 mg | RECTAL | Status: DC | PRN

## 2021-06-20 MED ORDER — FLUTICASONE PROPIONATE 50 MCG/ACT NA SUSP: 2.0000 | Freq: Every day | NASAL | 2 refills | Status: DC

## 2021-06-20 MED ORDER — LOSARTAN POTASSIUM 50 MG PO TABS
50.0000 mg | ORAL_TABLET | Freq: Every day | ORAL | Status: DC
  Administered 2021-06-21 – 2021-06-27 (×7): 50 mg via ORAL
  Filled 2021-06-20 (×7): qty 1

## 2021-06-20 MED ORDER — INSULIN ASPART 100 UNIT/ML IJ SOLN
6.0000 [IU] | Freq: Three times a day (TID) | INTRAMUSCULAR | Status: DC
  Administered 2021-06-21 – 2021-06-27 (×11): 6 [IU] via SUBCUTANEOUS

## 2021-06-20 MED ORDER — IPRATROPIUM-ALBUTEROL 0.5-2.5 (3) MG/3ML IN SOLN: 3.0000 mL | RESPIRATORY_TRACT | Status: DC | PRN

## 2021-06-20 MED ORDER — ATORVASTATIN CALCIUM 40 MG PO TABS
40.0000 mg | ORAL_TABLET | Freq: Every day | ORAL | Status: DC
  Administered 2021-06-21 – 2021-06-27 (×7): 40 mg via ORAL
  Filled 2021-06-20 (×7): qty 1

## 2021-06-20 MED ORDER — INSULIN GLARGINE-YFGN 100 UNIT/ML ~~LOC~~ SOLN: 28.0000 [IU] | Freq: Every day | SUBCUTANEOUS | 11 refills | Status: DC

## 2021-06-20 MED ORDER — INSULIN GLARGINE-YFGN 100 UNIT/ML ~~LOC~~ SOLN
29.0000 [IU] | Freq: Every day | SUBCUTANEOUS | Status: DC
  Administered 2021-06-21 – 2021-06-24 (×4): 29 [IU] via SUBCUTANEOUS
  Filled 2021-06-20 (×5): qty 0.29

## 2021-06-20 MED ORDER — GUAIFENESIN ER 600 MG PO TB12: 600.0000 mg | ORAL_TABLET | Freq: Two times a day (BID) | ORAL | 0 refills | Status: DC

## 2021-06-20 MED ORDER — ACETAMINOPHEN 325 MG PO TABS: 650.0000 mg | ORAL_TABLET | ORAL | Status: DC | PRN

## 2021-06-20 MED ORDER — LORATADINE 10 MG PO TABS
10.0000 mg | ORAL_TABLET | Freq: Every day | ORAL | Status: DC
  Administered 2021-06-21 – 2021-06-27 (×7): 10 mg via ORAL
  Filled 2021-06-20 (×7): qty 1

## 2021-06-20 MED ORDER — PANTOPRAZOLE SODIUM 40 MG PO TBEC
40.0000 mg | DELAYED_RELEASE_TABLET | Freq: Every day | ORAL | Status: DC
  Administered 2021-06-21 – 2021-06-27 (×7): 40 mg via ORAL
  Filled 2021-06-20 (×7): qty 1

## 2021-06-20 MED ORDER — FLUTICASONE PROPIONATE 50 MCG/ACT NA SUSP
2.0000 | Freq: Every day | NASAL | Status: DC
  Filled 2021-06-20: qty 16

## 2021-06-20 MED ORDER — HEPARIN (PORCINE) 25000 UT/250ML-% IV SOLN
1250.0000 [IU]/h | INTRAVENOUS | Status: DC
  Administered 2021-06-21: 1150 [IU]/h via INTRAVENOUS
  Administered 2021-06-21: 1200 [IU]/h via INTRAVENOUS
  Administered 2021-06-22 – 2021-06-25 (×4): 1250 [IU]/h via INTRAVENOUS
  Filled 2021-06-20 (×6): qty 250

## 2021-06-20 MED ORDER — BUTALBITAL-APAP-CAFFEINE 50-325-40 MG PO TABS
1.0000 | ORAL_TABLET | Freq: Four times a day (QID) | ORAL | Status: DC | PRN
  Administered 2021-06-20 – 2021-06-27 (×17): 1 via ORAL
  Filled 2021-06-20 (×17): qty 1

## 2021-06-20 MED ORDER — INSULIN GLARGINE-YFGN 100 UNIT/ML ~~LOC~~ SOLN
28.0000 [IU] | Freq: Every day | SUBCUTANEOUS | Status: DC
  Filled 2021-06-20 (×2): qty 0.28

## 2021-06-20 MED ORDER — MOMETASONE FURO-FORMOTEROL FUM 200-5 MCG/ACT IN AERO: 2.0000 | INHALATION_SPRAY | Freq: Two times a day (BID) | RESPIRATORY_TRACT | Status: DC

## 2021-06-20 MED ORDER — HYDROCODONE BIT-HOMATROP MBR 5-1.5 MG/5ML PO SOLN
5.0000 mL | Freq: Four times a day (QID) | ORAL | Status: DC | PRN
  Administered 2021-06-21: 5 mL via ORAL
  Filled 2021-06-20: qty 5

## 2021-06-20 MED ORDER — HEPARIN (PORCINE) 25000 UT/250ML-% IV SOLN: 900.0000 [IU]/h | INTRAVENOUS | Status: DC

## 2021-06-20 MED ORDER — ACETAMINOPHEN 160 MG/5ML PO SOLN: 650.0000 mg | ORAL | Status: DC | PRN

## 2021-06-20 MED ORDER — INSULIN ASPART 100 UNIT/ML IJ SOLN: 0.0000 [IU] | Freq: Three times a day (TID) | INTRAMUSCULAR | Status: DC

## 2021-06-20 MED ORDER — PANTOPRAZOLE SODIUM 40 MG PO TBEC: 40.0000 mg | DELAYED_RELEASE_TABLET | Freq: Every day | ORAL | Status: DC

## 2021-06-20 MED ORDER — BUTALBITAL-APAP-CAFFEINE 50-325-40 MG PO TABS: 1.0000 | ORAL_TABLET | Freq: Four times a day (QID) | ORAL | 0 refills | Status: DC | PRN

## 2021-06-20 MED ORDER — TRAMADOL HCL 50 MG PO TABS: 100.0000 mg | ORAL_TABLET | Freq: Four times a day (QID) | ORAL | Status: DC | PRN

## 2021-06-20 NOTE — Progress Notes (Signed)
Inpatient Rehab Admissions Coordinator:  ? ?I have insurance approval and a bed available for this patient to admit to CIR today.  Dr. Alfredia Ferguson in agreement. TOC and pt aware.  ? ?Shann Medal, PT, DPT ?Admissions Coordinator ?[phone] ?06/20/21  ?9:51 AM ? ?

## 2021-06-20 NOTE — Progress Notes (Signed)
ANTICOAGULATION CONSULT NOTE ? ?Pharmacy Consult for Heparin ?Indication: pulmonary embolus (s/p CVA with hemorrhagic conversion) ? ?Allergies  ?Allergen Reactions  ? Niaspan [Niacin]   ?  FLUSHING  ? ? ?Patient Measurements: ?Height: 5\' 7"  (170.2 cm) ?Weight: 67.1 kg (148 lb) ?IBW/kg (Calculated) : 66.1 ? ?Heparin Dosing Weight: 67 kg ? ?Vital Signs: ?Temp: 98.4 ?F (36.9 ?C) (04/20 1135) ?Temp Source: Oral (04/20 1135) ?BP: 136/80 (04/20 1135) ?Pulse Rate: 86 (04/20 1135) ? ?Labs: ?Recent Labs  ?  06/18/21 ?0859 06/19/21 ?0330 06/20/21 ?1761 06/20/21 ?1154  ?HGB 15.8 16.2 15.4  --   ?HCT 47.8 47.0 46.2  --   ?PLT 337 317 273  --   ?HEPARINUNFRC  --   --  <0.10* 0.22*  ?CREATININE 0.73 0.71 0.71  --   ? ? ?Estimated Creatinine Clearance: 80.3 mL/min (by C-G formula based on SCr of 0.71 mg/dL). ? ? ? ?Assessment: ?71 yo male who presented for worsening headaches found to have large IPH involving the left cerebellum infarct likely secondary ischemic. Now found to have right lower lobe PE. OK for heparin per neurology, low-dose stroke protocol. ? ?CBC stable, platelets 273. No s/sx bleeding reported. No issue with heparin infusion. Heparin level subtherapeutic at 0.22. Will increase heparin rate. ? ?Goal of Therapy:  ?Heparin level 0.3-0.5 units/ml ?Monitor platelets by anticoagulation protocol: Yes ?  ?Plan:  ?Increase heparin infusion to 1000 units/hr ?Check heparin level in 8 hours and daily while on heparin ?Continue to monitor H&H and platelets ? ? ? ?Thank you for allowing pharmacy to be a part of this patient?s care. ? ?Ardyth Harps, PharmD ?Clinical Pharmacist ? ?

## 2021-06-20 NOTE — Discharge Instructions (Addendum)
Inpatient Rehab Discharge Instructions ? ?Zada Finders ?Discharge date and time: No discharge date for patient encounter.  ? ?Activities/Precautions/ Functional Status: ?Activity: activity as tolerated ?Diet: regular diet ?Wound Care: Routine skin checks ?Functional status:  ?___ No restrictions     ___ Walk up steps independently ?___ 24/7 supervision/assistance   ___ Walk up steps with assistance ?___ Intermittent supervision/assistance  ___ Bathe/dress independently ?___ Walk with walker     _x__ Bathe/dress with assistance ?___ Walk Independently    ___ Shower independently ?___ Walk with assistance    ___ Shower with assistance ?___ No alcohol     ___ Return to work/school ________ ? ?COMMUNITY REFERRALS UPON DISCHARGE:   ? ?Home Health:   PT     OT     ST                   Agency: TBD Upon Insurance Approval  Phone:   ? ? ? ?Special Instructions: ?No driving smoking or alcohol ? ?Ambulatory referral for oncology services Dr Lindi Adie [phone] for evaluation of lung mass ? ? ?My questions have been answered and I understand these instructions. I will adhere to these goals and the provided educational materials after my discharge from the hospital. ? ?Patient/Caregiver Signature _______________________________ Date __________ ? ?Clinician Signature _______________________________________ Date __________ ? ?Please bring this form and your medication list with you to all your follow-up doctor's appointments.   ? ?Information on my medicine - ELIQUIS? (apixaban) ? ?This medication education was reviewed with me or my healthcare representative as part of my discharge preparation.  ? ?Why was Eliquis? prescribed for you? ?Eliquis? was prescribed to treat blood clots that may have been found in the veins of your legs (deep vein thrombosis) or in your lungs (pulmonary embolism) and to reduce the risk of them occurring again. ? ?What do You need to know about Eliquis? ? ?The dose is ONE 5 mg tablet taken TWICE daily.   Eliquis? may be taken with or without food.  ? ?Try to take the dose about the same time in the morning and in the evening. If you have difficulty swallowing the tablet whole please discuss with your pharmacist how to take the medication safely. ? ?Take Eliquis? exactly as prescribed and DO NOT stop taking Eliquis? without talking to the doctor who prescribed the medication.  Stopping may increase your risk of developing a new blood clot.  Refill your prescription before you run out. ? ?After discharge, you should have regular check-up appointments with your healthcare provider that is prescribing your Eliquis?. ?   ?What do you do if you miss a dose? ?If a dose of ELIQUIS? is not taken at the scheduled time, take it as soon as possible on the same day and twice-daily administration should be resumed. The dose should not be doubled to make up for a missed dose. ? ?Important Safety Information ?A possible side effect of Eliquis? is bleeding. You should call your healthcare provider right away if you experience any of the following: ?Bleeding from an injury or your nose that does not stop. ?Unusual colored urine (red or dark brown) or unusual colored stools (red or black). ?Unusual bruising for unknown reasons. ?A serious fall or if you hit your head (even if there is no bleeding). ? ?Some medicines may interact with Eliquis? and might increase your risk of bleeding or clotting while on Eliquis?[name] To help avoid this, consult your healthcare provider or pharmacist prior to using any  new prescription or non-prescription medications, including herbals, vitamins, non-steroidal anti-inflammatory drugs (NSAIDs) and supplements. ? ?This website has more information on Eliquis? (apixaban): http://www.eliquis.com/eliquis/home ? ?

## 2021-06-20 NOTE — Progress Notes (Signed)
PMR Admission Coordinator Pre-Admission Assessment ?  ?Patient: Micheal Donovan is an 71 y.o., male ?MRN: [id number] ?DOB: 09/08/1950 ?Height: 5\' 7"  (170.2 cm) ?Weight: 67.1 kg ?  ?Insurance Information ?HMO: yes    PPO:      PCP:      IPA:      80/20:      OTHER:  ?PRIMARY: [employer]      Policy#: [id number]      Subscriber: pt ?CM Name: [name]      Phone#: [phone]     Fax#: Epic Access ?Pre-Cert#: [id number] auth for CIR from [name] with [facility] for admit 4/20 with updates due in 7 days (they have Epic access)      Employer:  ?Benefits:  Phone #: [phone]     Name:  ?Eff. Date: 03/03/21     Deduct: $0      Out of Pocket Max: $3200 (met $25)      Life Max: n/a ?CIR: $295/day for days 1-6      SNF: 20 full days ?Outpatient:      Co-Pay: $15/visit ?Home Health: 100%      Co-Pay:  ?DME: 80%     Co-Ins: 20% ?Providers:  ?SECONDARY:       Policy#:      Phone#:  ?  ?Financial Counselor:       Phone#:  ?  ?The ?Data Collection Information Summary? for patients in Inpatient Rehabilitation Facilities with attached ?Privacy Act [place] Records? was provided and verbally reviewed with: Patient and Family ?  ?Emergency Contact Information ?Contact Information   ?  ?  Name Relation Home Work Mobile  ?  [name],Micheal Donovan [phone]   [phone]  ?  Donovan,Micheal Daughter     [phone]  ?  ?   ?  ?  ?Current Medical History  ?Patient Admitting Diagnosis: cerebellar hemorrhage  ?  ?History of Present Illness: Micheal Donovan. Hoar is a 71 year old right-handed male with history of type 2 diabetes mellitus with diabetic retinopathy as well as medical noncompliance, hypertension, hyperlipidemia, carotid ultrasound 2014 bilateral ICA stenosis 60 to 79%, quit smoking 30 years ago.  Presented 06/10/2021 to [facility] for progressive headache dizziness and gait ataxia x1 week.  He initially went to see his primary doctor initial thought was sinus headache placed on steroids and antibiotics.  Cranial CT scan at  AP showed acute to subacute intraparenchymal hemorrhage involving the left cerebellum measuring 3.3 x 2.7 x 4.3 cm with surrounding low-density vasogenic edema and mild regional mass effect.  Partial effacement of the fourth ventricle with no hydrocephalus.  He was transferred to [facility].  CT angiogram head and neck negative for large vessel occlusion.  Bulky calcified plaque about the left carotid bulb/proximal cervical left ICA with associated stenosis of up to 75% by NASCET criteria.  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  Finding was suspected to be incidental in nature.  Chest x-ray showed a 4.4 cm left midlung pulmonary mass concerning for malignancy.  MRI of the brain follow-up 06/12/2021 no significant interval change in size of left cerebellar intraparenchymal hematoma.  Admission chemistries unremarkable except WBC 12,200, hemoglobin 18.1, glucose 393, hemoglobin A1c 13.6, sodium 132, BUN 32.  Patient did require insulin drip in the ED for management of blood sugars.  Echocardiogram with ejection fraction of 55 to 60% no wall motion abnormalities grade 1 diastolic dysfunction.  Neurology follow-up in regards to intraparenchymal hemorrhage close monitoring of blood pressure.  Recommendations were made to begin aspirin 81 mg daily due to carotid stenosis in 5 to 7 days beginning approximately 06/21/2021.  He was started on a Medrol Dosepak per neurology for his headaches.  In regards to patient's findings of pulmonary mass noted on chest x-ray,  CT of chest completed 06/13/2021 showing 4.3 x 3.6 x 3.3 cm lobular spiculated mass in the posterior left upper lobe with teetering to the lateral pleural and major fissure with imagings consistent with primary bronchogenic neoplasm.  Urine Legionella antigen negative, urine strep pneumococcus antigen negative.  Pulmonary services consulted in regards to pulmonary mass patient underwent bronchoscopy 06/17/2021 per Dr. Ina Homes showing  no subcarinal adenopathy by EBUS [place].  Left hilar node sampled x6 and current plans to follow-up outpatient with oncology services.  During work-up of pulmonary mass CT/AP completed to look for evidence of metastatic disease showing no acute intra-abdominal or pelvic pathology however noting findings of RLL pulmonary embolism no evidence of RHS.  Venous Doppler studies lower extremities negative for DVT.  A follow-up cranial CT scan was completed to establish anticoagulation for follow-up of left cerebellar hemorrhagic conversion showed mild contraction of hematoma no hydrocephalus.  Patient was cleared by neurology service for low-dose intensity heparin intravenously without bolus.  Therapy evaluations completed and pt was recommended for a comprehensive rehab program.  ?  ?Complete NIHSS TOTAL: 0 ?  ?Patient's medical record from [facility] has been reviewed by the rehabilitation admission coordinator and physician. ?  ?Past Medical History  ?    ?Past Medical History:  ?Diagnosis Date  ? Allergy    ? BPH (benign prostatic hypertrophy)    ? Cancer [facility]    ?  Melanoma on Neck    2008  ? Carotid artery occlusion    ? Diabetes mellitus    ? ED (erectile dysfunction)    ? Hyperlipidemia    ? Hypertension    ? Retinopathy due to secondary DM [facility])    ?  ?  ?Has the patient had major surgery during 100 days prior to admission? No ?  ?Family History   ?family history includes COPD in his mother; Heart disease in his brother and father. ?  ?Current Medications ?  ?Current Facility-Administered Medications:  ?  acetaminophen (TYLENOL) tablet 650 mg, 650 mg, Oral, Q4H PRN, 650 mg at 06/19/21 1603 **OR** acetaminophen (TYLENOL) 160 MG/5ML solution 650 mg, 650 mg, Per Tube, Q4H PRN **OR** acetaminophen (TYLENOL) suppository 650 mg, 650 mg, Rectal, Q4H PRN, Amie Portland, MD ?  alum & mag hydroxide-simeth (MAALOX/MYLANTA) 200-200-20 MG/5ML suspension 30 mL, 30 mL, Oral, Q6H PRN, 30 mL at 06/15/21 2223 **AND** lidocaine  (XYLOCAINE) 2 % viscous mouth solution 15 mL, 15 mL, Oral, Q6H PRN, Eugenie Filler, MD ?  amLODipine (NORVASC) tablet 10 mg, 10 mg, Oral, Daily, Eugenie Filler, MD, 10 mg at 06/20/21 1610 ?  atorvastatin (LIPITOR) tablet 40 mg, 40 mg, Oral, Daily, Eugenie Filler, MD, 40 mg at 06/20/21 0934 ?  butalbital-acetaminophen-caffeine (FIORICET) 50-325-40 MG per tablet 1 tablet, 1 tablet, Oral, Q6H PRN, Raiford [place], DO, 1 tablet at 06/20/21 9604 ?  dextrose 50 % solution 0-50 mL, 0-50 mL, Intravenous, PRN, Adefeso, Oladapo, DO ?  fluticasone (FLONASE) 50 MCG/ACT nasal spray 2 spray, 2 spray, Each Nare, Daily, Eugenie Filler, MD, 2 spray at 06/20/21 [phone]  guaiFENesin (MUCINEX) 12 hr tablet 1,200 mg, 1,200 mg, Oral, BID, Eugenie Filler, MD, 1,200 mg at 06/20/21 8119 ?  heparin ADULT infusion 100 units/mL (25000 units/227mL), 900 Units/hr, Intravenous, Continuous, SheikhGeorgina Quint [place], [place], Last Rate: 9 mL/hr at 06/20/21 0513, 900 Units/hr at 06/20/21 0513 ?  hydrALAZINE (APRESOLINE) injection 10 mg, 10 mg, Intravenous, Q4H PRN, Little Ishikawa, MD, 10 mg at 06/19/21 0331 ?  HYDROcodone bit-homatropine (HYCODAN) 5-1.5 MG/5ML syrup 5 mL, 5 mL, Oral, Q6H PRN, Eugenie Filler, MD, 5 mL at 06/15/21 0357 ?  insulin aspart (novoLOG) injection 0-15 Units, 0-15 Units, Subcutaneous, TID WC, Little Ishikawa, MD, 3 Units at 06/20/21 [phone] ?  insulin aspart (novoLOG) injection 0-5 Units, 0-5 Units, Subcutaneous, QHS, Little Ishikawa, MD, 3 Units at 06/17/21 2225 ?  insulin aspart (novoLOG) injection 6 Units, 6 Units, Subcutaneous, TID WC, Eugenie Filler, MD, 6 Units at 06/20/21 0930 ?  insulin glargine-yfgn (SEMGLEE) injection 28 Units, 28 Units, Subcutaneous, Daily, Eugenie Filler, MD, 28 Units at 06/20/21 0930 ?  ipratropium-albuterol (DUONEB) 0.5-2.5 (3) MG/3ML nebulizer solution 3 mL, 3 mL, Nebulization, Q2H PRN, Eugenie Filler, MD, 3 mL at 06/15/21 0347 ?  loratadine (CLARITIN)  tablet 10 mg, 10 mg, Oral, Daily, Eugenie Filler, MD, 10 mg at 06/20/21 3128 ?  LORazepam (ATIVAN) injection 0.5 mg, 0.5 mg, Intravenous, Once, Eugenie Filler, MD ?  LORazepam (ATIVAN) tablet 0.5 mg,

## 2021-06-20 NOTE — TOC Transition Note (Signed)
Transition of Care (TOC) - CM/SW Discharge Note ? ? ?Patient Details  ?Name: Micheal Donovan ?MRN: [id number] ?Date of Birth: April 26, 1950 ? ?Transition of Care (TOC) CM/SW Contact:  ?Pollie Friar, RN ?Phone Number: ?06/20/2021, 11:44 AM ? ? ?Clinical Narrative:    ?Patient is discharging to CIR today. CM signing off.  ? ? ?Final next level of care: [place] ?Barriers to Discharge: No Barriers Identified ? ? ?Patient Goals and CMS Choice ?  ?CMS Medicare.gov Compare Post Acute Care list provided to:: Patient ?Choice offered to / list presented to : Patient ? ?Discharge Placement ?  ?           ?  ?  ?  ?  ? ?Discharge Plan and Services ?  ?Discharge Planning Services: CM Consult ?Post Acute Care Choice: IP Rehab          ?  ?  ?  ?  ?  ?  ?  ?  ?  ?  ? ?Social Determinants of Health (SDOH) Interventions ?  ? ? ?Readmission Risk Interventions ?   ? View : No data to display.  ?  ?  ?  ? ? ? ? ? ?

## 2021-06-20 NOTE — PMR Pre-admission (Signed)
PMR Admission Coordinator Pre-Admission Assessment ? ?Patient: Micheal Donovan is an 71 y.o., male ?MRN: [id number] ?DOB: 1950-12-05 ?Height: 5\' 7"  (170.2 cm) ?Weight: 67.1 kg ? ?Insurance Information ?HMO: yes    PPO:      PCP:      IPA:      80/20:      OTHER:  ?PRIMARY: [employer]      Policy#: [id number]      Subscriber: pt ?CM Name: [name]      Phone#: [phone]     Fax#: Epic Access ?Pre-Cert#: [id number] auth for CIR from [name] with [facility] for admit 4/20 with updates due in 7 days (they have Epic access)      Employer:  ?Benefits:  Phone #: [phone]     Name:  ?Eff. Date: 03/03/21     Deduct: $0      Out of Pocket Max: $3200 (met $25)      Life Max: n/a ?CIR: $295/day for days 1-6      SNF: 20 full days ?Outpatient:      Co-Pay: $15/visit ?Home Health: 100%      Co-Pay:  ?DME: 80%     Co-Ins: 20% ?Providers:  ?SECONDARY:       Policy#:      Phone#:  ? ?Financial Counselor:       Phone#:  ? ?The ?Data Collection Information Summary? for patients in Inpatient Rehabilitation Facilities with attached ?Privacy Act [place] Records? was provided and verbally reviewed with: Patient and Family ? ?Emergency Contact Information ?Contact Information   ? ? Name Relation Home Work Mobile  ? Donovan,Micheal Spouse [phone]  [phone]  ? Donovan,Micheal Daughter   [phone]  ? ?  ? ? ?Current Medical History  ?Patient Admitting Diagnosis: cerebellar hemorrhage  ? ?History of Present Illness: Micheal Donovan. Boody is a 71 year old right-handed male with history of type 2 diabetes mellitus with diabetic retinopathy as well as medical noncompliance, hypertension, hyperlipidemia, carotid ultrasound 2014 bilateral ICA stenosis 60 to 79%, quit smoking 30 years ago.  Presented 06/10/2021 to [facility] for progressive headache dizziness and gait ataxia x1 week.  He initially went to see his primary doctor initial thought was sinus headache placed on steroids and antibiotics.  Cranial CT scan at [facility] showed acute to  subacute intraparenchymal hemorrhage involving the left cerebellum measuring 3.3 x 2.7 x 4.3 cm with surrounding low-density vasogenic edema and mild regional mass effect.  Partial effacement of the fourth ventricle with no hydrocephalus.  He was transferred to [facility].  CT angiogram head and neck negative for large vessel occlusion.  Bulky calcified plaque about the left carotid bulb/proximal cervical left ICA with associated stenosis of up to 75% by NASCET criteria.  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  Finding was suspected to be incidental in nature.  Chest x-ray showed a 4.4 cm left midlung pulmonary mass concerning for malignancy.  MRI of the brain follow-up 06/12/2021 no significant interval change in size of left cerebellar intraparenchymal hematoma.  Admission chemistries unremarkable except WBC 12,200, hemoglobin 18.1, glucose 393, hemoglobin A1c 13.6, sodium 132, BUN 32.  Patient did require insulin drip in the ED for management of blood sugars.  Echocardiogram with ejection fraction of 55 to 60% no wall motion abnormalities grade 1 diastolic dysfunction.  Neurology follow-up in regards to intraparenchymal hemorrhage close monitoring of blood pressure.  Recommendations were made to begin aspirin 81 mg daily due to carotid stenosis in 5 to  7 days beginning approximately 06/21/2021.  He was started on a Medrol Dosepak per neurology for his headaches.  In regards to patient's findings of pulmonary mass noted on chest x-ray,  CT of chest completed 06/13/2021 showing 4.3 x 3.6 x 3.3 cm lobular spiculated mass in the posterior left upper lobe with teetering to the lateral pleural and major fissure with imagings consistent with primary bronchogenic neoplasm.  Urine Legionella antigen negative, urine strep pneumococcus antigen negative.  Pulmonary services consulted in regards to pulmonary mass patient underwent bronchoscopy 06/17/2021 per Dr. Ina Homes showing no subcarinal  adenopathy by EBUS [place].  Left hilar node sampled x6 and current plans to follow-up outpatient with oncology services.  During work-up of pulmonary mass CT/AP completed to look for evidence of metastatic disease showing no acute intra-abdominal or pelvic pathology however noting findings of RLL pulmonary embolism no evidence of RHS.  Venous Doppler studies lower extremities negative for DVT.  A follow-up cranial CT scan was completed to establish anticoagulation for follow-up of left cerebellar hemorrhagic conversion showed mild contraction of hematoma no hydrocephalus.  Patient was cleared by neurology service for low-dose intensity heparin intravenously without bolus.  Therapy evaluations completed and pt was recommended for a comprehensive rehab program.  ? ?Complete NIHSS TOTAL: 0 ? ?Patient's medical record from [facility] has been reviewed by the rehabilitation admission coordinator and physician. ? ?Past Medical History  ?Past Medical History:  ?Diagnosis Date  ? Allergy   ? BPH (benign prostatic hypertrophy)   ? Cancer [facility])   ? Melanoma on Neck    2008  ? Carotid artery occlusion   ? Diabetes mellitus   ? ED (erectile dysfunction)   ? Hyperlipidemia   ? Hypertension   ? Retinopathy due to secondary DM [facility])   ? ? ?Has the patient had major surgery during 100 days prior to admission? No ? ?Family History   ?family history includes COPD in his mother; Heart disease in his brother and father. ? ?Current Medications ? ?Current Facility-Administered Medications:  ?  acetaminophen (TYLENOL) tablet 650 mg, 650 mg, Oral, Q4H PRN, 650 mg at 06/19/21 1603 **OR** acetaminophen (TYLENOL) 160 MG/5ML solution 650 mg, 650 mg, Per Tube, Q4H PRN **OR** acetaminophen (TYLENOL) suppository 650 mg, 650 mg, Rectal, Q4H PRN, Amie [place], MD ?  alum & mag hydroxide-simeth (MAALOX/MYLANTA) 200-200-20 MG/5ML suspension 30 mL, 30 mL, Oral, Q6H PRN, 30 mL at 06/15/21 2223 **AND** lidocaine (XYLOCAINE) 2 % viscous mouth solution 15  mL, 15 mL, Oral, Q6H PRN, Eugenie Filler, MD ?  amLODipine (NORVASC) tablet 10 mg, 10 mg, Oral, Daily, Eugenie Filler, MD, 10 mg at 06/20/21 1607 ?  atorvastatin (LIPITOR) tablet 40 mg, 40 mg, Oral, Daily, Eugenie Filler, MD, 40 mg at 06/20/21 0934 ?  butalbital-acetaminophen-caffeine (FIORICET) 50-325-40 MG per tablet 1 tablet, 1 tablet, Oral, Q6H PRN, Raiford Noble [place], DO, 1 tablet at 06/20/21 3710 ?  dextrose 50 % solution 0-50 mL, 0-50 mL, Intravenous, PRN, Adefeso, Oladapo, DO ?  fluticasone (FLONASE) 50 MCG/ACT nasal spray 2 spray, 2 spray, Each Nare, Daily, Eugenie Filler, MD, 2 spray at 06/20/21 [phone] ?  guaiFENesin (MUCINEX) 12 hr tablet 1,200 mg, 1,200 mg, Oral, BID, Eugenie Filler, MD, 1,200 mg at 06/20/21 4854 ?  heparin ADULT infusion 100 units/mL (25000 units/221mL), 900 Units/hr, Intravenous, Continuous, SheikhGeorgina Quint [place], [place], Last Rate: 9 mL/hr at 06/20/21 0513, 900 Units/hr at 06/20/21 0513 ?  hydrALAZINE (APRESOLINE) injection 10 mg, 10 mg, Intravenous,  Q4H PRN, Little Ishikawa, MD, 10 mg at 06/19/21 7622 ?  HYDROcodone bit-homatropine (HYCODAN) 5-1.5 MG/5ML syrup 5 mL, 5 mL, Oral, Q6H PRN, Eugenie Filler, MD, 5 mL at 06/15/21 0357 ?  insulin aspart (novoLOG) injection 0-15 Units, 0-15 Units, Subcutaneous, TID WC, Little Ishikawa, MD, 3 Units at 06/20/21 [phone] ?  insulin aspart (novoLOG) injection 0-5 Units, 0-5 Units, Subcutaneous, QHS, Little Ishikawa, MD, 3 Units at 06/17/21 2225 ?  insulin aspart (novoLOG) injection 6 Units, 6 Units, Subcutaneous, TID WC, Eugenie Filler, MD, 6 Units at 06/20/21 0930 ?  insulin glargine-yfgn (SEMGLEE) injection 28 Units, 28 Units, Subcutaneous, Daily, Eugenie Filler, MD, 28 Units at 06/20/21 0930 ?  ipratropium-albuterol (DUONEB) 0.5-2.5 (3) MG/3ML nebulizer solution 3 mL, 3 mL, Nebulization, Q2H PRN, Eugenie Filler, MD, 3 mL at 06/15/21 0347 ?  loratadine (CLARITIN) tablet 10 mg, 10 mg, Oral, Daily,  Eugenie Filler, MD, 10 mg at 06/20/21 5456 ?  LORazepam (ATIVAN) injection 0.5 mg, 0.5 mg, Intravenous, Once, Eugenie Filler, MD ?  LORazepam (ATIVAN) tablet 0.5 mg, 0.5 mg, Oral, Once PRN, Lona Millard

## 2021-06-20 NOTE — Progress Notes (Signed)
STROKE TEAM PROGRESS NOTE  ? ?INTERVAL HISTORY ?No family is at the bedside. Pt lying in bed, not in distress.  Stated that he still has mild headache, dizziness with head motion, but no nausea vomiting, able to work with PT/OT.  Found to have a PE yesterday, repeat CT showed left cerebellum hemorrhagic conversion improved, started on heparin IV last night.  Pending CIR discharge today. ? ?Vitals:  ? 06/20/21 0018 06/20/21 0728 06/20/21 0749 06/20/21 1135  ?BP: 125/68  124/70 136/80  ?Pulse: 85  82 86  ?Resp: 17  18 18   ?Temp: 97.7 ?F (36.5 ?C)  97.7 ?F (36.5 ?C) 98.4 ?F (36.9 ?C)  ?TempSrc: Oral  Oral Oral  ?SpO2: 96% 96% 98% 98%  ?Weight:      ?Height:      ? ?CBC:  ?Recent Labs  ?Lab 06/18/21 ?6659 06/19/21 ?0330 06/20/21 ?0319  ?WBC 12.8* 14.1* 13.1*  ?NEUTROABS 10.2* 9.8*  --   ?HGB 15.8 16.2 15.4  ?HCT 47.8 47.0 46.2  ?MCV 86.4 84.2 86.2  ?PLT 337 317 273  ? ?Basic Metabolic Panel:  ?Recent Labs  ?Lab 06/18/21 ?9357 06/19/21 ?0330 06/20/21 ?0319  ?NA 130* 130* 127*  ?K 4.2 3.8 4.1  ?CL 96* 98 95*  ?CO2 24 25 22   ?GLUCOSE 251* 161* 206*  ?BUN 22 17 23   ?CREATININE 0.73 0.71 0.71  ?CALCIUM 9.6 9.3 8.8*  ?MG 1.8  --  2.0  ?PHOS  --   --  3.8  ? ?Lipid Panel:  ?No results for input(s): CHOL, TRIG, HDL, CHOLHDL, VLDL, LDLCALC in the last 168 hours. ? ?HgbA1c:  ?No results for input(s): HGBA1C in the last 168 hours. ? ?Urine Drug Screen:  ?No results for input(s): LABOPIA, COCAINSCRNUR, LABBENZ, AMPHETMU, THCU, LABBARB in the last 168 hours. ?  ?Alcohol Level  ?No results for input(s): ETH in the last 168 hours. ? ? ?IMAGING past 24 hours ?No results found. ? ?PHYSICAL EXAM ? ?Physical Exam  ?Constitutional: Appears well-developed and well-nourished.  Elderly male not in distress ?Cardiovascular: Normal rate and regular rhythm.  ?Respiratory: Effort normal, non-labored breathing ? ?Neuro: ?Mental Status: ?Patient is awake, alert, oriented to person, place, month, year, and situation. ?Patient is able to give a clear  and coherent history. ?No signs of aphasia or neglect ?Cranial Nerves: ?II: Visual Fields are full. Pupils are equal, round, and reactive to light.  Saccadic dysmettria to the left only but no nystagmus ?III,IV, VI: EOMI without ptosis or diploplia.  ?V: Facial sensation is symmetric to temperature ?VII: Facial movement is symmetric resting and smiling ?VIII: Hearing is intact to voice, right gaze unsustained low amplitude nystagmus ?X: Palate elevates symmetrically ?XI: Shoulder shrug is symmetric. ?XII: Tongue protrudes midline without atrophy or fasciculations.  ?Motor: ?Tone is normal. Bulk is normal. 5/5 strength was present in all four extremities.  ?Sensory: ?Sensation is symmetric to light touch in the arms and legs. ?Cerebellar: ?FNF and intact bilaterally ? ? ?ASSESSMENT/PLAN ?[name] is a 71 y.o. male with history of  uncontrolled diabetes, hypertension, hyperlipidemia, retinopathy from diabetes, based on carotid ultrasound 2014-bilateral ICA stenosis 60 to 79%, presented to the emergency room at [facility] for evaluation of a headache that had been going on for 1 week.  He reports that he took some aspirin for his headache and that his blood sugar and blood pressure have been abnormally high this week.  MRI shows no significant change in the size of the IPH, unchanged regional  mass effect.  Carotid Dopplers are pending ? ?Stroke: hemorrhagic infarct left cerebellum, likely small vs. Large vessel disease given multiple uncontrolled risk factors ?Code Stroke CT head Acute to subacute IPH involving the left cerebellum (estimated volume 19 mL). Surrounding low-density vasogenic edema with mild regional mass effect. Partial effacement of the fourth ventricle which remains patent at this time. No hydrocephalus or transtentorial herniation. ?CTA head & neck- No LVO, Bulky calcified plaque about the left carotid bulb/proximal cervical left ICA with associated stenosis of up to 75%.  ?MRI  No  significant interval change in size of the left cerebellar intraparenchymal hematoma with unchanged regional mass effect and partial effacement of fourth ventricle but no upstream hydrocephalus.  ?CT repeat 4/19 - Slight contraction of the LEFT cerebellar intraparenchymal Hemorrhage, no hydro ?Carotid Doppler 60 to 79% left ICA   ?2D Echo EF 55-60%  ?LDL 213 ?HgbA1c 13.6 ?VTE prophylaxis - heparin IV ?aspirin 81 mg daily prior to admission, now on heparin IV low intensity given right PE. ?Therapy recommendations: CIR ? ?Right PE ?Found to have right lower lung PE ?LE venous Doppler negative for DVT ?Repeat CT head showed contraction of hemorrhagic conversion at left cerebellum ?Started heparin IV 4/19 low intensity without bolus ?Recommend to continue for 7 days and then switch to p.o. Eliquis. ? ?Hypertension ?Home meds:  None ?Stable, BP goal less than 160 ?Long-term BP goal normotensive ? ?Hyperlipidemia ?Home meds:  None ?LDL 213, goal < 70 ?Add Atorvastatin 40mg   ?Continue statin at discharge ? ?Diabetes type II Uncontrolled ?Home meds: Insulin ?HgbA1c 13.6, goal < 7.0 ?CBGs ?SSI ?Close PCP follow-up for better DM control ? ?Other Stroke Risk Factors ?Advanced Age >/= 69  ?Former cigarette smoker ?ETOH use, alcohol level <10, advised to drink no more than 2 drink(s) a day ? ?Other Active Problems ?Leukocytosis-improved ?WBC 12.5-> 9.6 ?Elevated H/H 18.1/55.1-> 16.9/50.7 ? ?Hospital day # 9 ? ?Neurology will sign off. Please call with questions. Pt will follow up with stroke clinic NP at [facility] in about 4 weeks. Thanks for the consult. ? ?Rosalin Hawking, MD [name] ?Stroke Neurology ?06/20/2021 ?7:34 PM ? ? ?To contact Stroke Continuity provider, please refer to [email or web address] ?After hours, contact General Neurology ? ?

## 2021-06-20 NOTE — Progress Notes (Signed)
Heparin drip increased to 11.5 as per new order. Patient aware.  ?

## 2021-06-20 NOTE — H&P (Signed)
? ? ?Physical Medicine and Rehabilitation Admission H&P ? ?CC: Intraparenchymal hemorrhage of brain ? ?HPI: Micheal Donovan is a 71 year old right-handed male with history of type 2 diabetes mellitus with diabetic retinopathy as well as medical noncompliance, hypertension, hyperlipidemia, carotid ultrasound 2014 bilateral ICA stenosis 60 to 79%, quit smoking 30 years ago.  Per chart review patient lives with spouse.  1 level home 3 steps to entry.  Independent prior to admission.  Presented 06/10/2021 to [facility] for progressive headache dizziness and gait ataxia x1 week.[name]  He initially went to see his primary doctor initial thought was sinus headache placed on steroids and antibiotics.  Cranial CT scan showed acute to subacute intraparenchymal hemorrhage involving the left cerebellum measuring 3.3 x 2.7 x 4.3 cm.  Surrounding low-density vasogenic edema with mild regional mass effect.  Partial effacement of the fourth ventricle with no hydrocephalus.  He was transferred to [facility].  CT angiogram head and neck negative for large vessel occlusion.  Bulky calcified plaque about the left carotid bulb/proximal cervical left ICA with associated stenosis of up to 75% by NASCET criteria.  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  Finding was suspected to be incidental in nature.  Chest x-ray showed a 4.4 cm left midlung pulmonary mass concerning for malignancy.  MRI of the brain follow-up 06/12/2021 no significant interval change in size of left cerebellar intraparenchymal hematoma.  Admission chemistries unremarkable except WBC 12,200, hemoglobin 18.1, glucose 393, hemoglobin A1c 13.6, sodium 132, BUN 32.  Patient did require insulin drip in the ED for management of blood sugars.  Echocardiogram with ejection fraction of 55 to 60% no wall motion abnormalities grade 1 diastolic dysfunction.  Neurology follow-up in regards to intraparenchymal hemorrhage close monitoring of blood  pressure.  Recommendations were made to begin aspirin 81 mg daily due to carotid stenosis in 5 to 7 days beginning approximately 06/21/2021.  He was started on a Medrol Dosepak per neurology for his headaches.  In regards to patient's findings of pulmonary mass noted on chest x-ray.  CT of chest completed 06/13/2021 showing 4.3 x 3.6 x 3.3 cm lobular spiculated mass in the posterior left upper lobe with teetering to the lateral pleural and major fissure with imagings consistent with primary bronchogenic neoplasm.  Urine Legionella antigen negative, urine strep pneumococcus antigen negative.  Pulmonary services consulted in regards to pulmonary mass patient initially reluctant and underwent bronchoscopy 06/17/2021 per Dr. Ina Homes showing no subcarinal adenopathy by EBUS [place].  Left hilar node sampled x6 and current plans to follow-up outpatient with oncology services.  During work-up of pulmonary mass CT/AP completed to look for evidence of metastatic disease showing no acute intra-abdominal or pelvic pathology however noting findings of RLL pulmonary embolism no evidence of RHS.  Venous Doppler studies lower extremities negative for DVT.  A follow-up cranial CT scan was completed to establish anticoagulation for follow-up of left cerebellar hemorrhagic conversion showed mild contraction of hematoma no hydrocephalus.  Patient was cleared by neurology service for low-dose intensity heparin intravenously without bolus.  Therapy evaluations completed due to patient decreased functional mobility was admitted for a comprehensive rehab program. Currently complains of blurred vision. ? ?Review of Systems  ?Constitutional:  Negative for chills and fever.  ?HENT:  Negative for hearing loss.   ?Eyes:  Positive for blurred vision. Negative for double vision.  ?Respiratory:  Negative for cough and shortness of breath.   ?Cardiovascular:  Negative for chest pain, palpitations and  leg swelling.  ?Gastrointestinal:  Positive for  constipation. Negative for heartburn, nausea and vomiting.  ?Genitourinary:  Positive for urgency. Negative for dysuria, flank pain and hematuria.  ?Musculoskeletal:  Positive for joint pain and myalgias.  ?Skin:  Negative for rash.  ?Neurological:  Positive for dizziness and headaches.  ?     Gait ataxia x1 week  ?All other systems reviewed and are negative. ?Past Medical History:  ?Diagnosis Date  ? Allergy   ? BPH (benign prostatic hypertrophy)   ? Cancer [facility])   ? Melanoma on Neck    2008  ? Carotid artery occlusion   ? Diabetes mellitus   ? ED (erectile dysfunction)   ? Hyperlipidemia   ? Hypertension   ? Retinopathy due to secondary DM ([place])   ? ?Past Surgical History:  ?Procedure Laterality Date  ? BRONCHIAL NEEDLE ASPIRATION BIOPSY  06/17/2021  ? Procedure: BRONCHIAL NEEDLE ASPIRATION BIOPSIES;  Surgeon: Candee Furbish, MD;  Location: [facility] ENDOSCOPY;  Service: Pulmonary;;  ? MELANOMA EXCISION  2008  ? Left side of neck  ? RADIOLOGY WITH ANESTHESIA N/A 04/05/2020  ? Procedure: MRI SPINE WITOUT CONTRAST;  Surgeon: Radiologist, Medication, MD;  Location: [place];  Service: Radiology;  Laterality: N/A;  ? TONSILLECTOMY    ? VIDEO BRONCHOSCOPY WITH ENDOBRONCHIAL ULTRASOUND N/A 06/17/2021  ? Procedure: VIDEO BRONCHOSCOPY WITH ENDOBRONCHIAL ULTRASOUND;  Surgeon: Candee Furbish, MD;  Location: [facility] ENDOSCOPY;  Service: Pulmonary;  Laterality: N/A;  ? ?Family History  ?Problem Relation Age of Onset  ? COPD Mother   ? Heart disease Father   ? Heart disease Brother   ?     MI at age 40  ? ?Social History:  reports that he quit smoking about 30 years ago. He has never used smokeless tobacco. He reports that he does not drink alcohol and does not use drugs. ?Allergies:  ?Allergies  ?Allergen Reactions  ? Niaspan [Niacin]   ?  FLUSHING  ? ?Medications Prior to Admission  ?Medication Sig Dispense Refill  ? acetaminophen (TYLENOL) 325 MG tablet Take 2 tablets (650 mg total) by mouth every 4 (four) hours as needed for mild pain (or  temp > 37.5 C (99.5 F)).    ? [START ON 06/21/2021] amLODipine (NORVASC) 10 MG tablet Take 1 tablet (10 mg total) by mouth daily.    ? [START ON 06/21/2021] atorvastatin (LIPITOR) 40 MG tablet Take 1 tablet (40 mg total) by mouth daily.    ? B-D UF III MINI PEN NEEDLES 31G X 5 MM MISC as directed (Patient not taking: Reported on 06/11/2021) 100 each 4  ? butalbital-acetaminophen-caffeine (FIORICET) 50-325-40 MG tablet Take 1 tablet by mouth every 6 (six) hours as needed for headache. 14 tablet 0  ? Dulaglutide (TRULICITY) 1.5 FH/5.4TG SOPN Inject 1.5 mg into the skin once a week. (Patient not taking: Reported on 06/11/2021) 12 pen 1  ? [START ON 06/21/2021] fluticasone (FLONASE) 50 MCG/ACT nasal spray Place 2 sprays into both nostrils daily.  2  ? glucose blood (ONETOUCH ULTRA) test strip USE TWICE DAILY (Patient not taking: Reported on 06/11/2021) 200 strip 2  ? guaiFENesin (MUCINEX) 600 MG 12 hr tablet Take 1 tablet (600 mg total) by mouth 2 (two) times daily for 5 days. 10 tablet 0  ? heparin 25000 UT/250ML infusion Inject 900 Units/hr into the vein continuous.    ? [START ON 06/21/2021] insulin glargine-yfgn (SEMGLEE) 100 UNIT/ML injection Inject 0.28 mLs (28 Units total) into the skin daily. 10 mL 11  ?  ipratropium-albuterol (DUONEB) 0.5-2.5 (3) MG/3ML SOLN Take 3 mLs by nebulization every 2 (two) hours as needed. 360 mL   ? [START ON 06/21/2021] loratadine (CLARITIN) 10 MG tablet Take 1 tablet (10 mg total) by mouth daily.    ? losartan (COZAAR) 50 MG tablet TAKE 1 TABLET(50 MG) BY MOUTH DAILY (Patient not taking: Reported on 06/11/2021) 30 tablet 0  ? mometasone-formoterol (DULERA) 200-5 MCG/ACT AERO Inhale 2 puffs into the lungs 2 (two) times daily. 1 each   ? [START ON 06/21/2021] pantoprazole (PROTONIX) 40 MG tablet Take 1 tablet (40 mg total) by mouth daily at 6 (six) AM.    ? senna-docusate (SENOKOT-S) 8.6-50 MG tablet Take 1 tablet by mouth 2 (two) times daily.    ? traMADol (ULTRAM) 50 MG tablet Take 2 tablets  (100 mg total) by mouth every 6 (six) hours as needed for moderate pain. 10 tablet   ? ? ? ? ?Home: ?Home Living ?Family/patient expects to be discharged to:: Private residence ?Living Arrangements: Spouse/sign

## 2021-06-20 NOTE — Telephone Encounter (Signed)
Scheduled appt per 4/19 referral. Pt is aware of appt date and time. Pt is aware to arrive 15 mins prior to appt time and to bring and updated insurance card. Pt is aware of appt location.   ?

## 2021-06-20 NOTE — Progress Notes (Signed)
ANTICOAGULATION CONSULT NOTE  ?Pharmacy Consult for heparin ?Indication: pulmonary embolus  ?Brief A/P: Heparin level subtherapeutic Increase Heparin rate ? ?Allergies  ?Allergen Reactions  ? Niaspan [Niacin]   ?  FLUSHING  ? ? ?Patient Measurements: ?Height: 5\' 7"  (170.2 cm) ?Weight: 67.1 kg (148 lb) ?IBW/kg (Calculated) : 66.1 ?Heparin Dosing Weight: 67.1 kg ? ?Vital Signs: ?Temp: 97.7 ?F (36.5 ?C) (04/20 0018) ?Temp Source: Oral (04/20 0018) ?BP: 125/68 (04/20 0018) ?Pulse Rate: 85 (04/20 0018) ? ?Labs: ?Recent Labs  ?  06/18/21 ?0859 06/19/21 ?0330 06/20/21 ?0319  ?HGB 15.8 16.2 15.4  ?HCT 47.8 47.0 46.2  ?PLT 337 317 273  ?HEPARINUNFRC  --   --  <0.10*  ?CREATININE 0.73 0.71  --   ? ? ? ?Estimated Creatinine Clearance: 80.3 mL/min (by C-G formula based on SCr of 0.71 mg/dL). ? ? ?Assessment: ?71 yo male with new PE,  s/p recent CVA with hemorrhagic conversion for heparin ? ?Goal of Therapy:  ?Heparin level 0.3-0.5 ?Monitor platelets by anticoagulation protocol: Yes ?  ?Plan:  ?Increase Heparin 900 units/hr ?Check heparin level in 8 hours. ? ?Phillis Knack, PharmD, BCPS ? ? ?

## 2021-06-20 NOTE — Discharge Summary (Addendum)
Physician Discharge Summary   Patient: Micheal Donovan MRN: [id number] DOB: Nov 12, 1950  Admit date:     06/10/2021  Discharge date: 06/20/21  Discharge Physician: Kerney Elbe   PCP: [name], MD   Recommendations at discharge:   Follow up with PCP within 1-2 weeks and repeat CBC, CMP, Mag, Phos within 1 week Follow up with Medical Oncology for outpatient follow up for Poorly Differentiated Non-Small Cell Lung Carcinoma, Adenocarcinoma and have an outpatient PET Scan and follow up with PD-L1 and Foundation One Testing  Continue IV Heparin for 7 days total and then discuss with Oncology about whether to transition to oral Apixaban vs. Lovenox Shots  Discharge Diagnoses: Principal Problem:   Intraparenchymal hemorrhage of brain ([place]) Active Problems:   Essential hypertension   Type 2 diabetes mellitus with hyperglycemia (H[facility])   Pseudohyponatremia   Dehydration   Leukocytosis   Thrombocytosis   Hypercalcemia   Hyperlipidemia   CAP (community acquired pneumonia)   Cerebellar bleed ([place])   Pulmonary mass   Headache  Resolved Problems:   * No resolved hospital problems. [facility] Course: The patient Micheal Donovan is a 71 y.o. male with medical history significant of T2DM, hypertension who presents to the emergency department due to 1 week onset of generalized weakness, dizziness and headache in the posterior part of head.     Patient indicates he has been out of home blood pressure and diabetes medications for months, presented to PCP with notable hyperglycemia, hypertension and profound dehydration and subsequently sent to the hospital for further evaluation and treatment.   Unfortunately in the ED patient's hyperglycemia was profound requiring insulin drip overnight, patient's headache was followed up with CT head noncontrast which was remarkable for acute to subacute intraparenchymal hemorrhage of the left cerebellum 3.3 x 2.7 x 4.3 cm around 19 mL.  Given these  acute findings patient's blood pressure was aggressively controlled, patient is transferred to [facility] for closer neurology and neurosurgical follow-up.  **Interim History  He was worked up for his subacute intraparenchymal hemorrhage of the brain versus hemorrhagic transformation of his ischemic stroke and headache and neurology recommended starting aspirin 81 mg due to carotid stenosis in 5 to 7 days.  Subsequently he was found to have a lung mass noted on CT scan and this was worked up and was found to be poorly differentiated non-small cell lung carcinoma, adenocarcinoma and he has no evidence of metastatic disease by CT scan but does have an acute PE noted.  Unfortunately we are unable to anticoagulate patient at this time given his recent intraparenchymal hemorrhage.  We will need to discuss with neurology as well as pulmonary and medical oncology about further anticoagulation recommendations and Neurology recommending starting Heparin gtt for 7 days and then transition to oral Apixaban vs. Lovenox.  PT OT evaluated and recommending CIR and he is stable to D/C to CIR today.   Assessment and Plan: No notes have been filed under this hospital service. Service: Hospitalist  Embolic Acute Ischemic Stroke with Hemorrhagic Transformation -Patient noted to have presented with elevated blood sugar levels, noted to have some dizziness and gait ataxia not improving over the past week. -CT head done concerning for subacute intraparenchymal hemorrhage involving the left cerebellum of about 19 cc with surrounding low-density vasogenic edema and mild regional mass effect and partial effacement of fourth ventricle which was patent without evidence of hydrocephalus or herniation. -MRI brain done with no significant interval change in  size of left cerebellar intraparenchymal hematoma with unchanged regional mass effect and partial effacement of fourth ventricle but no upstream hydrocephalus. -2D  echo EF 55 to 95%,MWUX, grade 1 diastolic dysfunction, normal right ventricular systolic function, no source of emboli noted. 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  Findings suspected to be incidental in nature and unlikely to be source of left cerebellar hemorrhage.  No other vascular abnormality seen underlying left cerebellar bleed.  Moderate atheromatous stenosis involving the dominant mid right V4 segment.  Patchy consolidative left upper lobe opacity suspicious for pneumonia.  Emphysema. -CT chest with concern for primary lung carcinoma. -LDL 213, hemoglobin A1c 13.6. -Continue Atorvastatin 40 mg po Daily . -BP control with goal systolic blood pressure < 160. -PT/OT/SLP recommending CIR. -Neurology recommending starting aspirin 81 mg daily due to carotid stenosis in 5 to 7 days (06/21/2021); Currently on a Heparin gtt and Neurology recommending continuing Heparin gtt for 7 days and then transition to either oral Apixaban or continue Lovenox shots  -Neurology following and I appreciate their input and recommendations. -Awaiting placement in CIR and he is stable to D/C to CIR  DKA versus honk/anion gap metabolic acidosis in the setting of uncontrolled diabetes mellitus/hyperglycemia, improved  -On admission at [facility] patient was placed on the Endo tool/insulin drip and subsequently transitioned off insulin drip. -Hemoglobin A1c 13.6. -CBGs ranging from 116-232 -Continue Semglee to 28 units daily.  -Continue NovoLog 6 units 3 times daily with meals. -SSI. -Diabetes coordinator following.  Pulmonary mass  noted on chest x-ray +/- pneumonia Poorly Differentiated Non-Small cell Lung Carcinoma, Adenocarcinoma  -Chest x-ray done with a 4.4 cm left midlung pulmonary mass concerning for malignancy. -CT angiogram head and neck done with some concern for patchy consolidative left upper lobe opacity suspicious for pneumonia. -CT chest done yesterday morning 06/13/2021 with a 4.3 x 3.6 x 3.3 cm lobular spiculated mass in the posterior left upper lobe with teetering to the lateral pleural and major fissure with imaging findings consistent with primary bronchogenic neoplasm.  Metastatic left hilar lymphadenopathy.  Peripheral micronodularity posterior right costophrenic sulcus, likely related to infectious/inflammatory etiology and/or aspiration. -Urine Legionella antigen negative -Urine strep pneumococcus antigen negative. -Status post 5 days azithromycin.   -Discontinued IV Rocephin and placed on oral Vantin to complete a 7-day course of antibiotic treatment.   -Continue Claritin, Flonase, Dulera, Mucinex, scheduled DuoNebs. -Patient seen in consultation by pulmonary, Dr. Lamonte Sakai who discussed options for tissue diagnosis including bronchoscopy and EBUS however patient initially wanted to think about his options and then more about the procedure going forward before committing to scheduling it.   -Patient noted on 06/15/2021 to be ready and willing to undergo bronchoscopy with EBUS and pulmonary informed.   -Patient seen by pulmonary subsequently underwent bronchoscopy and EBUS 06/17/2021 with biopsies obtained and showed "Malignant cells present, poorly differentiated non-small cell  carcinoma, consistent with adenocarcinoma."  -Oncology consulted per pulmonary and patient seen in consultation by Dr. Lindi Adie. -Staging CT abdomen and pelvis ordered for yesterday and showed "No acute intra-abdominal or pelvic pathology. Aortic  Atherosclerosis." -We will likely need PET scan and outpatient follow-up with oncology and  they  are ordereding PD-L1 and Foundation 1 Testing as an outpatient and Thoracic Oncology Navigator is arranging outpatient follow-up with Dr. Julien Nordmann  -Pulmonary following and appreciate input and recommendations.   Acute PE -Unable to safely anticoagulate given Subacute intraparenchymal hemorrhage of the brain versus hemorrhagic transformation of ischemic stroke/headache -Likely in the setting of Malignancy  -Will discuss with Pulmonary and Oncology; I discussed with neurology and they are recommending repeating stat head CT scan to evaluate his bleeding currently and if stable or improved they are recommending starting a heparin drip for now and then likely transitioning to oral anticoagulation in about 7 days versus Lovenox injections; will need oncology input about this. -Repeat Head CT Scan done 06/19/21 showed "Slight contraction of the LEFT cerebellar intraparenchymal hemorrhage. Persistent  effacement of the fourth ventricle with no hydrocephalus identified. No change in ventricular volume compared to prior. No new  intraparenchymal or extra-axial hemorrhage. Maxillary sinusitis." -Checking LE Venous Duplex and showed "No evidence of deep vein thrombosis seen in the lower extremities, bilaterally. No evidence of popliteal cyst, bilaterally." -Repeat ECHO done and showed "Right ventricular systolic function is normal. The right ventricular size is normal. " -Patient's PESI Class is at least 4 -Noted on CT Abdomen and Pelvis yesterday and showed "Right lower lobe pulmonary artery embolus. No evidence of right heart straining" -Consider obtaining a dedicated CTA of the chest PE protocol but will not change management given that he has a diagnosed PE -Continue Heparin gtt for 7 Days and then transition to oral Apixaban vs. Lovenox Injections   Medication noncompliance -Dr. Avon Gully discussed with patient at bedside need for medication compliance given his hypertension, concurrent  intraparenchymal hemorrhage in DKA.  Pseudohyponatremia with superimposed Hyponatremia -Improved with correction of hyperglycemia. -Na+ is now 127 and corrected for Glucose of 206 is 129 -Could have Hyponatremia from Lung Cancer -Continue to Monitor and Trend and repeat CMP within 1 week    Leukocytosis -Could be related to Steroid Demargination from Medrol Dose Pak -WBC went from 12.8 -> 14.1 -> 13.1 -Continue to Monitor for S/Sx of Infection and getting treatment as above with Abx  -Repeat CBC in the AM  Hypertension -Continue current regimen of Amlodipine 10 mg po Daily and Losartan 50 mg po Daily.  -C/w Hydralazine 10 mg IV q4hprn HBP Goal of SBP <120 -Continue to Monitor BP per Protocol -Last BP Reading was 124/70   GERD/GI Prophylaxis -C/w Pantoprazole 40 mg po Daily   Hyperglycemia -See above, Diabetes and DKA  Dehydration -Improved with hydration.  Hemoconcentration with notable leukocytosis, thrombocytosis and polycythemia -Initially improved with hydration -Now WBC has gone from 12.2 -> 12.8 -> 14.1 -> 13.1 -Hgb/Hct went from 15.9/47.1 -> 15.8/47.8 -> 16.2/47.0 -> 15.4/46.2 -Platelet Count now gone from 337 -> 337 -> 317 -> 273 -Continue to Monitor and Trend and Repeat CBC in the AM   Headaches -Patient with complaints of headaches despite narcotics early on in the hospitalization.   -CT chest with concern for primary lung carcinoma.  Patient started on Medrol Dosepak per neurology which patient stated is helping headaches in addition to increased dose of Ultram. -Added Fiorcet today given persistence of Headaches -Outpatient follow-up.   Pain control - [employer] Controlled Substance Reporting System database was reviewed. and patient was instructed, not to drive, operate heavy machinery, perform activities at heights, swimming or participation in water activities or provide baby-sitting services while on Pain, Sleep and Anxiety Medications; until their  outpatient Physician has advised to do so  again. Also recommended to not to take more than prescribed Pain, Sleep and Anxiety Medications.   Consultants: Pulmonary, Medical Oncology, CIR, and Neurology  Procedures performed: CT head 06/11/2021 CT angiogram head and neck 06/12/2021 Chest x-ray 06/12/2021 MRI brain 06/12/2021 2D echo 06/12/2021 CT chest 06/13/2021 Bronchoscopy/EBUS 06/17/2021 CT abdomen and pelvis 06/18/21  Repeat CT head 06/19/2021  Disposition: Rehabilitation facility  Diet recommendation:  Carb modified diet  DISCHARGE MEDICATION: Allergies as of 06/20/2021       Reactions   Niaspan [niacin]    FLUSHING        Medication List     STOP taking these medications    aspirin EC 81 MG tablet   Lantus SoloStar 100 UNIT/ML Solostar Pen Generic drug: insulin glargine   metFORMIN 500 MG tablet Commonly known as: GLUCOPHAGE   omeprazole 20 MG capsule Commonly known as: PRILOSEC       TAKE these medications    acetaminophen 325 MG tablet Commonly known as: TYLENOL Take 2 tablets (650 mg total) by mouth every 4 (four) hours as needed for mild pain (or temp > 37.5 C (99.5 F)).   amLODipine 10 MG tablet Commonly known as: NORVASC Take 1 tablet (10 mg total) by mouth daily. Start taking on: June 21, 2021   atorvastatin 40 MG tablet Commonly known as: LIPITOR Take 1 tablet (40 mg total) by mouth daily. Start taking on: June 21, 2021   B-D UF III MINI PEN NEEDLES 31G X 5 MM Misc Generic drug: Insulin Pen Needle as directed   butalbital-acetaminophen-caffeine 50-325-40 MG tablet Commonly known as: FIORICET Take 1 tablet by mouth every 6 (six) hours as needed for headache.   Dulaglutide 1.5 MG/0.5ML Sopn Commonly known as: Trulicity Inject 1.5 mg into the skin once a week.   fluticasone 50 MCG/ACT nasal spray Commonly known as: FLONASE Place 2 sprays into both nostrils daily. Start taking on: June 21, 2021   guaiFENesin 600 MG 12 hr  tablet Commonly known as: MUCINEX Take 1 tablet (600 mg total) by mouth 2 (two) times daily for 5 days.   heparin 25000 UT/250ML infusion Inject 900 Units/hr into the vein continuous.   insulin glargine-yfgn 100 UNIT/ML injection Commonly known as: SEMGLEE Inject 0.28 mLs (28 Units total) into the skin daily. Start taking on: June 21, 2021   ipratropium-albuterol 0.5-2.5 (3) MG/3ML Soln Commonly known as: DUONEB Take 3 mLs by nebulization every 2 (two) hours as needed.   loratadine 10 MG tablet Commonly known as: CLARITIN Take 1 tablet (10 mg total) by mouth daily. Start taking on: June 21, 2021   losartan 50 MG tablet Commonly known as: COZAAR TAKE 1 TABLET(50 MG) BY MOUTH DAILY   mometasone-formoterol 200-5 MCG/ACT Aero Commonly known as: DULERA Inhale 2 puffs into the lungs 2 (two) times daily.   OneTouch Ultra test strip Generic drug: glucose blood USE TWICE DAILY   pantoprazole 40 MG tablet Commonly known as: PROTONIX Take 1 tablet (40 mg total) by mouth daily at 6 (six) AM. Start taking on: June 21, 2021   senna-docusate 8.6-50 MG tablet Commonly known as: Senokot-S Take 1 tablet by mouth 2 (two) times daily.   traMADol 50 MG tablet Commonly known as: ULTRAM Take 2 tablets (100 mg total) by mouth every 6 (six) hours as needed for moderate pain.       Discharge Exam: [facility] Weights   06/10/21 2115 06/17/21 1306  Weight: 67.2 kg 67.1 kg   Vitals:   06/20/21 0749 06/20/21  1135  BP: 124/70 136/80  Pulse: 82 86  Resp: 18 18  Temp: 97.7 F (36.5 C) 98.4 F (36.9 C)  SpO2: 98% 98%   Examination: Physical Exam:  Constitutional: WN/WD Caucasian male in NAD and appears calm  Respiratory: Diminished to auscultation bilaterally, no wheezing, rales, rhonchi or crackles. Normal respiratory effort and patient is not tachypenic. No accessory muscle use. Unlabored breathing  Cardiovascular: RRR, no murmurs / rubs / gallops. S1 and S2 auscultated. Trace  extremity edema Abdomen: Soft, non-tender, non-distended. Bowel sounds positive.  GU: Deferred. Musculoskeletal: No clubbing / cyanosis of digits/nails. No joint deformity upper and lower extremities.  Skin: No rashes, lesions, ulcers on a limited skin evaluation. No induration; Warm and dry.  Neurologic: CN 2-12 grossly intact with no focal deficits. Romberg sign and cerebellar reflexes not assessed.  Psychiatric: Normal judgment and insight. Alert and oriented x 3. Normal mood and appropriate affect.   Condition at discharge: stable  The results of significant diagnostics from this hospitalization (including imaging, microbiology, ancillary and laboratory) are listed below for reference.   Imaging Studies: CT ANGIO HEAD NECK W WO CM  Result Date: 06/12/2021 CLINICAL DATA:  Follow-up examination for hemorrhagic stroke. EXAM: CT ANGIOGRAPHY HEAD AND NECK TECHNIQUE: Multidetector CT imaging of the head and neck was performed using the standard protocol during bolus administration of intravenous contrast. Multiplanar CT image reconstructions and MIPs were obtained to evaluate the vascular anatomy. Carotid stenosis measurements (when applicable) are obtained utilizing NASCET criteria, using the distal internal carotid diameter as the denominator. RADIATION DOSE REDUCTION: This exam was performed according to the departmental dose-optimization program which includes automated exposure control, adjustment of the mA and/or kV according to patient size and/or use of iterative reconstruction technique. CONTRAST:  65mL OMNIPAQUE IOHEXOL 350 MG/ML SOLN COMPARISON:  Prior CT from earlier the same day. FINDINGS: CTA NECK FINDINGS Aortic arch: Visualized aortic arch normal caliber with normal branch pattern. Moderate atheromatous change about the arch and origin of the great vessels without high-grade stenosis. Right carotid system: Right CCA patent from its origin to the bifurcation without stenosis. Atheromatous  change about the right carotid bulb/proximal right ICA without significant stenosis. Right ICA patent distally without stenosis or dissection. Left carotid system: Left CCA patent from its origin to the bifurcation without significant stenosis. Bulky calcified plaque about the left carotid bulb/proximal cervical left ICA with associated stenosis of up to 75% by NASCET criteria. Left ICA tortuous but widely patent distally without stenosis or dissection. Vertebral arteries: Both vertebral arteries arise from subclavian arteries. No proximal subclavian artery stenosis. Right vertebral artery slightly dominant. Moderate approximate 50% stenosis noted at the origin of the left vertebral artery. Vertebral arteries patent distally without stenosis or dissection. Skeleton: Prior fusion at C5-6 with solid arthrodesis. Underlying moderate multilevel cervical spondylosis. Other neck: No other acute soft tissue abnormality within the neck. Few small thyroid nodules noted, largest of which measures 6 mm at the right thyroid lobe, of doubtful significance given size and patient age, no follow-up imaging recommended (ref: J Am Coll Radiol. 2015 Feb;12(2): 143-50). Upper chest: Patchy consolidative opacity partially visualize within the posterior left upper lobe, suspicious for pneumonia. Underlying emphysema. Review of the MIP images confirms the above findings CTA HEAD FINDINGS Anterior circulation: Petrous segments patent bilaterally. Mild atheromatous irregularity within the carotid siphons without significant stenosis. A1 segments patent bilaterally. Left A1 dominant. Normal anterior communicating artery complex. Anterior cerebral arteries patent without significant stenosis. No M1 stenosis or occlusion. No  proximal MCA branch occlusion. Distal MCA branches perfused and symmetric. Posterior circulation: Moderate atheromatous stenosis noted involving the dominant mid right V4 segment (series 7, image 150). Right PICA patent.  Left vertebral artery patent as it courses into the cranial vault. Left PICA patent. Severe atheromatous irregularity throughout the left V4 segment distal to the takeoff of the left PICA with associated severe multifocal stenoses. The left vertebral artery is markedly attenuated but remains grossly patent to the vertebrobasilar junction. 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 Basilar irregular but patent to its distal aspect without high-grade stenosis. Superior cerebellar arteries patent bilaterally. Right PCA supplied via the basilar as well as a prominent right posterior periphery. Predominant fetal type origin of the left PCA. Both PCAs patent to their distal aspects without high-grade stenosis. Venous sinuses: Grossly patent allowing for timing the contrast bolus. Anatomic variants: As above. No other vascular abnormality seen underlying the left cerebellar hemorrhage. Review of the MIP images confirms the above findings IMPRESSION: 1. Negative CTA for large vessel occlusion. 2. Bulky calcified plaque about the left carotid bulb/proximal cervical left ICA with associated stenosis of up to 75% by NASCET criteria. 3. Moderate approximate 50% stenosis at the origin of the left vertebral artery. 4. Severe atheromatous irregularity throughout the left V4 segment, with associated severe multifocal stenoses. 5. 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 Finding is suspected to be incidental in nature, and unlikely to be the source of the left cerebellar hemorrhage. No other vascular abnormality seen underlying the left cerebellar bleed. 6. Moderate atheromatous stenosis involving the dominant mid right V4 segment. 7. Patchy consolidative left upper lobe opacity, suspicious for pneumonia. 8. Emphysema (ICD10-J43.9). Electronically Signed   By: Jeannine Boga M.D.   On: 06/12/2021 01:10   CT  HEAD WO CONTRAST (5MM)  Result Date: 06/19/2021 CLINICAL DATA:  LEFT cerebellar hemorrhage. EXAM: CT HEAD WITHOUT CONTRAST TECHNIQUE: Contiguous axial images were obtained from the base of the skull through the vertex without intravenous contrast. RADIATION DOSE REDUCTION: This exam was performed according to the departmental dose-optimization program which includes automated exposure control, adjustment of the mA and/or kV according to patient size and/or use of iterative reconstruction technique. COMPARISON:  CT 06/11/2021, MRI 06/12/2021 FINDINGS: Brain: Again demonstrated intraparenchymal hemorrhage within the LEFT cerebellum. There is effacement of fourth ventricle not changed from prior. Mild vasogenic edema towards the cerebral peduncles. No increase in volume of the hematoma measuring approximately 3.5 x 2.3 cm compared with 3.8 by 2.1 cm. No hydrocephalus. Third ventricle and lateral ventricles unchanged in volume. No intraparenchymal extra-axial hemorrhage elsewhere. Vascular: No hyperdense vessel or unexpected calcification. Skull: Normal. Negative for fracture or focal lesion. Sinuses/Orbits: Fluid within the maxillary sinuses is increased in the interval. Other: IMPRESSION: 1. Slight contraction of the LEFT cerebellar intraparenchymal hemorrhage. 2. Persistent effacement of the fourth ventricle with no hydrocephalus identified. No change in ventricular volume compared to prior. 3. No new intraparenchymal or extra-axial hemorrhage. 4. Maxillary sinusitis. Electronically Signed   By: Suzy Bouchard M.D.   On: 06/19/2021 18:21   CT HEAD WO CONTRAST (5MM)  Result Date: 06/11/2021 CLINICAL DATA:  Follow-up examination for subdural hemorrhage. EXAM: CT HEAD WITHOUT CONTRAST TECHNIQUE: Contiguous axial images were obtained from the base of the skull through the vertex without intravenous contrast. RADIATION DOSE REDUCTION: This exam was performed according to the departmental dose-optimization program  which includes automated exposure control, adjustment of the mA and/or kV according to patient size and/or use of iterative reconstruction technique. COMPARISON:  Prior CT performed earlier the same day. FINDINGS: Brain: Previously identified intraparenchymal hemorrhage positioned at the left cerebellum again seen, not significantly changed in size or morphology as compared to previous. Surrounding vasogenic edema with regional mass effect is also similar. Similar mild effacement of the fourth ventricle with localized basilar cistern effacement. Ventricular size and morphology elsewhere is stable without hydrocephalus. No new hemorrhage elsewhere within the brain. No other acute large vessel territory infarct. No visible mass lesion. No midline shift. No extra-axial fluid collection. Vascular: No hyperdense vessel. Skull: Scalp soft tissues demonstrate no acute finding. Small lipoma noted at the left frontal scalp. Calvarium intact. Sinuses/Orbits: Globes orbital soft tissues demonstrate no acute finding. Mild-to-moderate mucosal thickening about the partially visualized maxillary sinuses. Paranasal sinuses are otherwise largely clear. No mastoid effusion. Other: None. IMPRESSION: 1. No significant interval change in size and morphology of left cerebellar intraparenchymal hemorrhage with similar regional mass effect. Stable partial effacement of the fourth ventricle without progressive hydrocephalus. 2. No other new acute intracranial abnormality. Electronically Signed   By: Jeannine Boga M.D.   On: 06/11/2021 21:58   CT Head Wo Contrast  Result Date: 06/11/2021 CLINICAL DATA:  Initial evaluation for acute dizziness. EXAM: CT HEAD WITHOUT CONTRAST TECHNIQUE: Contiguous axial images were obtained from the base of the skull through the vertex without intravenous contrast. RADIATION DOSE REDUCTION: This exam was performed according to the departmental dose-optimization program which includes automated  exposure control, adjustment of the mA and/or kV according to patient size and/or use of iterative reconstruction technique. COMPARISON:  None. FINDINGS: Brain: Cerebral volume within normal limits. Acute intraparenchymal hemorrhage involving the left cerebellum measuring approximately 3.3 x 2.7 x 4.3 cm (estimated volume 19 mL). Surrounding low-density vasogenic edema with mild regional mass effect. Partial effacement of the fourth ventricle which remains patent at this time. No hydrocephalus. No transtentorial herniation or midline shift. No other acute intracranial hemorrhage or large vessel territory infarct. No visible mass lesion. No extra-axial fluid collection. Vascular: No hyperdense vessel. Skull: Scalp soft tissues demonstrate no acute finding. Calvarium intact. Sinuses/Orbits: Globes and orbital soft tissues demonstrate no acute finding. Paranasal sinuses and mastoid air cells are clear. Other: None. IMPRESSION: 1. Acute to subacute intraparenchymal hemorrhage involving the left cerebellum measuring approximately 3.3 x 2.7 x 4.3 cm (estimated volume 19 mL). Surrounding low-density vasogenic edema with mild regional mass effect. Partial effacement of the fourth ventricle which remains patent at this time. No hydrocephalus or transtentorial herniation. 2. No other acute intracranial abnormality. Critical Value/emergent results were called by telephone at the time of interpretation on 06/11/2021 at 12:49 am to provider [facility] , who verbally acknowledged these results. Electronically Signed   By: Jeannine Boga M.D.   On: 06/11/2021 00:53   CT CHEST W CONTRAST  Result Date: 06/13/2021 CLINICAL DATA:  Pulmonary lesion on recent chest x-ray. EXAM: CT CHEST WITH CONTRAST TECHNIQUE: Multidetector CT imaging of the chest was performed during intravenous contrast administration. RADIATION DOSE REDUCTION: This exam was performed according to the departmental dose-optimization program which  includes automated exposure control, adjustment of the mA and/or kV according to patient size and/or use of iterative reconstruction technique. CONTRAST:  69mL OMNIPAQUE IOHEXOL 300 MG/ML  SOLN COMPARISON:  Chest x-ray 06/12/2021 FINDINGS: Cardiovascular: The heart size is normal. No substantial pericardial effusion. Coronary artery calcification is evident. Mild atherosclerotic calcification is  noted in the wall of the thoracic aorta. Mediastinum/Nodes: No mediastinal lymphadenopathy. 14 mm short axis left hilar node visible on 58/3. A second enlarged potentially necrotic left hilar node is seen on 68/3 measuring 18 mm short axis. The esophagus has normal imaging features. There is no axillary lymphadenopathy. Lungs/Pleura: 4.3 x 3.6 x 3.3 cm lobular spiculated mass is identified in the posterior left upper lobe with tethering to the lateral pleura and major fissure. There is some ground-glass opacity peripheral to the lesion, potentially postobstructive etiology. Peripheral micro nodularity noted posterior right costophrenic sulcus (114/5), likely related to infectious/inflammatory etiology and/or aspiration. No pleural effusion. Upper Abdomen: Unremarkable. Musculoskeletal: No worrisome lytic or sclerotic osseous abnormality. IMPRESSION: 1. 4.3 x 3.6 x 3.3 cm lobular spiculated mass in the posterior left upper lobe with tethering to the lateral pleura and major fissure. Imaging features are consistent with primary bronchogenic neoplasm. 2. Metastatic left hilar lymphadenopathy. 3. Peripheral micro nodularity posterior right costophrenic sulcus, likely related to infectious/inflammatory etiology and/or aspiration. 4. Aortic Atherosclerosis (ICD10-I70.0). Electronically Signed   By: Misty Stanley M.D.   On: 06/13/2021 09:54   MR BRAIN W WO CONTRAST  Result Date: 06/12/2021 CLINICAL DATA:  Hemorrhagic stroke left cerebellar hemorrhage seen on CT EXAM: MRI HEAD WITHOUT AND WITH CONTRAST TECHNIQUE: Multiplanar,  multiecho pulse sequences of the brain and surrounding structures were obtained without and with intravenous contrast. CONTRAST:  16mL GADAVIST GADOBUTROL 1 MMOL/ML IV SOLN COMPARISON:  CT head dated 1 day prior FINDINGS: Brain: Again seen is an intraparenchymal hematoma in the left cerebellar hemisphere. The hematoma measures 4.6 cm TV x 3.5 cm AP by 2.7 cm cc measured on the T2 sequence, similar in size compared to the CT head from 1 day prior when measured again using similar technique, allowing for differences in modality. There is edema in the surrounding cerebellum with partial effacement of the fourth ventricle but no upstream hydrocephalus, also not significantly changed. There are intrinsically T1 hyperintense blood products within the hematoma. There is no discernible enhancement. There is no other acute intracranial hemorrhage extra-axial fluid collection. There is no evidence of acute infarct. Background parenchymal volume is normal. The ventricles are normal in size. There is a mild burden of white matter microangiopathic change. There is no midline shift.  There is no abnormal enhancement. Vascular: The left V4 segment flow void is attenuated, in keeping with findings on prior CTA. The other major intracranial flow voids are normal. Skull and upper cervical spine: Normal marrow signal. Sinuses/Orbits: There are mucous retention cysts in the bilateral maxillary sinuses. The globes and orbits are unremarkable. Other: There is a left frontal scalp lipoma. IMPRESSION: No significant interval change in size of the left cerebellar intraparenchymal hematoma with unchanged regional mass effect and partial effacement of fourth ventricle but no upstream hydrocephalus. There is no discernible enhancement to suggest underlying mass lesion, though acute blood products could mask enhancement. Consider follow up MRI with contrast in 2-3 months to reassess. Electronically Signed   By: Valetta Mole M.D.   On: 06/12/2021  12:39   CT ABDOMEN PELVIS W CONTRAST  Result Date: 06/18/2021 CLINICAL DATA:  Non-small cell lung cancer. EXAM: CT ABDOMEN AND PELVIS WITH CONTRAST TECHNIQUE: Multidetector CT imaging of the abdomen and pelvis was performed using the standard protocol following bolus administration of intravenous contrast. RADIATION DOSE REDUCTION: This exam was performed according to the departmental dose-optimization program which includes automated exposure control, adjustment of the mA and/or kV according to patient size and/or  use of iterative reconstruction technique. CONTRAST:  161mL OMNIPAQUE IOHEXOL 300 MG/ML  SOLN COMPARISON:  Chest CT dated 06/13/2021. FINDINGS: Lower chest: Right lower lobe pulmonary artery embolus noted. No evidence of right heart straining. Partially visualized mass in the lingula as well as partially visualized left hilar metastatic adenopathy measuring 13 mm in short axis. These are better evaluated on recent CT of 06/13/2021. There is coronary vascular calcification. No intra-abdominal free air or free fluid. Hepatobiliary: No focal liver abnormality is seen. No gallstones, gallbladder wall thickening, or biliary dilatation. Pancreas: Unremarkable. No pancreatic ductal dilatation or surrounding inflammatory changes. Spleen: Normal in size without focal abnormality. Adrenals/Urinary Tract: The adrenal glands are unremarkable. There is no hydronephrosis on either side. There is symmetric enhancement and excretion of contrast by both kidneys. There is a 12 mm left renal interpolar cyst as well as additional subcentimeter hypodensities which are too small to characterize. The visualized ureters and urinary bladder appear unremarkable. Stomach/Bowel: There is no bowel obstruction or active inflammation. The appendix is normal. Vascular/Lymphatic: Moderate aortoiliac atherosclerotic disease. The IVC is unremarkable. No portal venous gas. There is no adenopathy. Reproductive: The prostate and seminal  vesicles are grossly unremarkable. No pelvic mass. Other: None Musculoskeletal: Degenerative changes of the spine. No acute osseous pathology. IMPRESSION: 1. Right lower lobe pulmonary artery embolus. No evidence of right heart straining. 2. No acute intra-abdominal or pelvic pathology. 3. Aortic Atherosclerosis (ICD10-I70.0). These results were called by telephone at the time of interpretation on 06/18/2021 at 9:17 pm to Dr. Alcario Drought, who verbally acknowledged these results. Electronically Signed   By: Anner Crete M.D.   On: 06/18/2021 21:29   DG Chest Port 1 View  Result Date: 06/12/2021 CLINICAL DATA:  Shortness of breath EXAM: PORTABLE CHEST 1 VIEW COMPARISON:  02/17/2009 FINDINGS: 4.4 cm left mid lung pulmonary mass. No focal consolidation. No pleural effusion or pneumothorax. Heart and mediastinal contours are unremarkable. No acute osseous abnormality. IMPRESSION: 1. A 4.4 cm left mid lung pulmonary mass concerning for malignancy until proven otherwise. Recommend further evaluation with a CT of the chest with intravenous contrast. Electronically Signed   By: Kathreen Devoid M.D.   On: 06/12/2021 09:20   ECHOCARDIOGRAM COMPLETE  Result Date: 06/19/2021    ECHOCARDIOGRAM REPORT   Patient Name:   Micheal Donovan Date of Exam: 06/19/2021 Medical Rec #:  [id number]     Height:       67.0 in Accession #:    [id number]    Weight:       148.0 lb Date of Birth:  [birth year]     BSA:          1.779 m Patient Age:    60 years      BP:           120/74 mmHg Patient Gender: M             HR:           88 bpm. Exam Location:  Inpatient Procedure: 2D Echo, Cardiac Doppler and Color Doppler Indications:    Pulmonary embolus  History:        Patient has prior history of Echocardiogram examinations, most                 recent 06/12/2021. Risk Factors:Diabetes, Hypertension and                 Dyslipidemia.  Sonographer:    [place] Referring Phys: [place]  1. Abnormal septal motion . Left  ventricular ejection fraction, by estimation, is 45 to 50%. The left ventricle has mildly decreased function. The left ventricle demonstrates global hypokinesis. There is mild left ventricular hypertrophy. Left ventricular diastolic parameters are consistent with Grade I diastolic dysfunction (impaired relaxation).  2. Right ventricular systolic function is normal. The right ventricular size is normal.  3. The mitral valve is normal in structure. No evidence of mitral valve regurgitation. No evidence of mitral stenosis.  4. The aortic valve is tricuspid. There is mild calcification of the aortic valve. There is mild thickening of the aortic valve. Aortic valve regurgitation is not visualized. Aortic valve sclerosis is present, with no evidence of aortic valve stenosis.  5. The inferior vena cava is normal in size with greater than 50% respiratory variability, suggesting right atrial pressure of 3 mmHg. FINDINGS  Left Ventricle: Abnormal septal motion. Left ventricular ejection fraction, by estimation, is 45 to 50%. The left ventricle has mildly decreased function. The left ventricle demonstrates global hypokinesis. The left ventricular internal cavity size was normal in size. There is mild left ventricular hypertrophy. Left ventricular diastolic parameters are consistent with Grade I diastolic dysfunction (impaired relaxation). Right Ventricle: The right ventricular size is normal. No increase in right ventricular wall thickness. Right ventricular systolic function is normal. Left Atrium: Left atrial size was normal in size. Right Atrium: Right atrial size was normal in size. Pericardium: There is no evidence of pericardial effusion. Mitral Valve: The mitral valve is normal in structure. No evidence of mitral valve regurgitation. No evidence of mitral valve stenosis. Tricuspid Valve: The tricuspid valve is normal in structure. Tricuspid valve regurgitation is not demonstrated. No evidence of tricuspid stenosis.  Aortic Valve: The aortic valve is tricuspid. There is mild calcification of the aortic valve. There is mild thickening of the aortic valve. Aortic valve regurgitation is not visualized. Aortic valve sclerosis is present, with no evidence of aortic valve stenosis. Pulmonic Valve: The pulmonic valve was normal in structure. Pulmonic valve regurgitation is not visualized. No evidence of pulmonic stenosis. Aorta: The aortic root is normal in size and structure. Venous: The inferior vena cava is normal in size with greater than 50% respiratory variability, suggesting right atrial pressure of 3 mmHg. IAS/Shunts: No atrial level shunt detected by color flow Doppler.  LEFT VENTRICLE PLAX 2D LVIDd:         4.05 cm     Diastology LVIDs:         3.98 cm     LV e' medial:    5.66 cm/s LV PW:         1.23 cm     LV E/e' medial:  6.8 LV IVS:        1.16 cm     LV e' lateral:   9.68 cm/s LVOT diam:     2.40 cm     LV E/e' lateral: 4.0 LV SV:         59 LV SV Index:   33 LVOT Area:     4.52 cm  LV Volumes (MOD) LV vol d, MOD A2C: 64.7 ml LV vol d, MOD A4C: 80.9 ml LV vol s, MOD A2C: 39.9 ml LV vol s, MOD A4C: 44.5 ml LV SV MOD A2C:     24.8 ml LV SV MOD A4C:     80.9 ml LV SV MOD BP:      29.8 ml RIGHT VENTRICLE RV Basal diam:  2.55 cm RV Mid diam:  2.20 cm RV S prime:     14.40 cm/s TAPSE (M-mode): 2.3 cm LEFT ATRIUM             Index LA Vol (A2C):   32.0 ml 17.98 ml/m LA Vol (A4C):   33.8 ml 19.00 ml/m LA Biplane Vol: 34.3 ml 19.28 ml/m  AORTIC VALVE             PULMONIC VALVE LVOT Vmax:   78.30 cm/s  PV Vmax:       0.67 m/s LVOT Vmean:  54.300 cm/s PV Peak grad:  1.8 mmHg LVOT VTI:    0.130 m  AORTA Ao Root diam: 2.90 cm Ao Asc diam:  3.10 cm MITRAL VALVE MV Area (PHT): 4.10 cm     SHUNTS MV Decel Time: 185 msec     Systemic VTI:  0.13 m MV E velocity: 38.60 cm/s   Systemic Diam: 2.40 cm MV A velocity: 111.00 cm/s MV E/A ratio:  0.35 Jenkins Rouge MD Electronically signed by Jenkins Rouge MD Signature Date/Time:  06/19/2021/11:04:14 AM    Final    ECHOCARDIOGRAM COMPLETE  Result Date: 06/12/2021    ECHOCARDIOGRAM REPORT   Patient Name:   Micheal Donovan Date of Exam: 06/12/2021 Medical Rec #:  [id number]     Height:       67.0 in Accession #:    [id number]    Weight:       148.1 lb Date of Birth:  09-04-1950     BSA:          1.780 m Patient Age:    68 years      BP:           140/73 mmHg Patient Gender: M             HR:           83 bpm. Exam Location:  Inpatient Procedure: 2D Echo Indications:    Stroke  History:        Patient has no prior history of Echocardiogram examinations.                 Risk Factors:Diabetes and Hypertension.  Sonographer:    Jefferey Pica Referring Phys: [id number] ASHISH ARORA IMPRESSIONS  1. Left ventricular ejection fraction, by estimation, is 55 to 60%. The left ventricle has normal function. The left ventricle has no regional wall motion abnormalities. There is mild left ventricular hypertrophy of the basal-septal segment. Left ventricular diastolic parameters are consistent with Grade I diastolic dysfunction (impaired relaxation).  2. Right ventricular systolic function is normal. The right ventricular size is normal. Tricuspid regurgitation signal is inadequate for assessing PA pressure.  3. The mitral valve is normal in structure. Trivial mitral valve regurgitation. No evidence of mitral stenosis.  4. The aortic valve is tricuspid. Aortic valve regurgitation is not visualized. Aortic valve sclerosis/calcification is present, without any evidence of aortic stenosis. Aortic valve Vmax measures 1.06 m/s.  5. Aortic dilatation noted. There is borderline dilatation of the aortic root, measuring 37 mm.  6. The inferior vena cava is normal in size with greater than 50% respiratory variability, suggesting right atrial pressure of 3 mmHg. FINDINGS  Left Ventricle: Left ventricular ejection fraction, by estimation, is 55 to 60%. The left ventricle has normal function. The left ventricle has no  regional wall motion abnormalities. The left ventricular internal cavity size was normal in size. There is  mild left ventricular hypertrophy of the basal-septal segment. Left ventricular diastolic parameters are  consistent with Grade I diastolic dysfunction (impaired relaxation). Normal left ventricular filling pressure. Right Ventricle: The right ventricular size is normal. No increase in right ventricular wall thickness. Right ventricular systolic function is normal. Tricuspid regurgitation signal is inadequate for assessing PA pressure. Left Atrium: Left atrial size was normal in size. Right Atrium: Right atrial size was normal in size. Pericardium: There is no evidence of pericardial effusion. Mitral Valve: The mitral valve is normal in structure. Mild mitral annular calcification. Trivial mitral valve regurgitation. No evidence of mitral valve stenosis. Tricuspid Valve: The tricuspid valve is normal in structure. Tricuspid valve regurgitation is mild . No evidence of tricuspid stenosis. Aortic Valve: The aortic valve is tricuspid. Aortic valve regurgitation is not visualized. Aortic valve sclerosis/calcification is present, without any evidence of aortic stenosis. Aortic valve peak gradient measures 4.5 mmHg. Pulmonic Valve: The pulmonic valve was normal in structure. Pulmonic valve regurgitation is trivial. No evidence of pulmonic stenosis. Aorta: Aortic dilatation noted. There is borderline dilatation of the aortic root, measuring 37 mm. Venous: The inferior vena cava is normal in size with greater than 50% respiratory variability, suggesting right atrial pressure of 3 mmHg. IAS/Shunts: No atrial level shunt detected by color flow Doppler.  LEFT VENTRICLE PLAX 2D LVIDd:         4.80 cm     Diastology LVIDs:         3.00 cm     LV e' medial:    5.03 cm/s LV PW:         1.10 cm     LV E/e' medial:  8.2 LV IVS:        1.20 cm     LV e' lateral:   7.51 cm/s LVOT diam:     2.10 cm     LV E/e' lateral: 5.5 LV SV:          59 LV SV Index:   33 LVOT Area:     3.46 cm  LV Volumes (MOD) LV vol d, MOD A4C: 70.3 ml LV vol s, MOD A4C: 38.9 ml LV SV MOD A4C:     70.3 ml RIGHT VENTRICLE             IVC RV Basal diam:  2.70 cm     IVC diam: 1.20 cm RV S prime:     13.60 cm/s TAPSE (M-mode): 1.7 cm LEFT ATRIUM             Index        RIGHT ATRIUM           Index LA diam:        3.00 cm 1.69 cm/m   RA Area:     12.80 cm LA Vol (A2C):   37.5 ml 21.07 ml/m  RA Volume:   26.40 ml  14.83 ml/m LA Vol (A4C):   32.5 ml 18.26 ml/m LA Biplane Vol: 35.1 ml 19.72 ml/m  AORTIC VALVE                 PULMONIC VALVE AV Area (Vmax): 3.01 cm     PV Vmax:       0.72 m/s AV Vmax:        106.50 cm/s  PV Peak grad:  2.1 mmHg AV Peak Grad:   4.5 mmHg LVOT Vmax:      92.70 cm/s LVOT Vmean:     56.200 cm/s LVOT VTI:       0.169 m  AORTA Ao Root diam: 3.70 cm  Ao Asc diam:  3.20 cm MITRAL VALVE MV Area (PHT): 4.08 cm    SHUNTS MV Decel Time: 186 msec    Systemic VTI:  0.17 m MV E velocity: 41.10 cm/s  Systemic Diam: 2.10 cm MV A velocity: 98.10 cm/s MV E/A ratio:  0.42 Fransico Him MD Electronically signed by Fransico Him MD Signature Date/Time: 06/12/2021/10:18:46 AM    Final    VAS US CAROTID  Result Date: 06/20/2021 Carotid Arterial Duplex Study Patient Name:  Micheal Donovan  Date of Exam:   06/13/2021 Medical Rec #: [id number]      Accession #:    [id number] Date of Birth: 24-Feb-1951      Patient Gender: M Patient Age:   71 years Exam Location:  [facility] Procedure:      VAS US CAROTID Referring Phys: Janine Ores --------------------------------------------------------------------------------  Indications:      CVA. Risk Factors:     Hypertension, hyperlipidemia, Diabetes, past history of                   smoking. Comparison Study: 06-11-2021 CTA head/neck showed left ICA stenosis of up to                   75%.                    06-04-2012 Carotid duplex study showed 60-79% left ICA                   stenosis. Performing Technologist:  Darlin Coco RDMS, RVT  Examination Guidelines: A complete evaluation includes B-mode imaging, spectral Doppler, color Doppler, and power Doppler as needed of all accessible portions of each vessel. Bilateral testing is considered an integral part of a complete examination. Limited examinations for reoccurring indications may be performed as noted.  Right Carotid Findings: +----------+--------+--------+--------+-------------------------+--------+           PSV cm/sEDV cm/sStenosisPlaque Description       Comments +----------+--------+--------+--------+-------------------------+--------+ CCA Prox  74      11                                                +----------+--------+--------+--------+-------------------------+--------+ CCA Distal69      19                                                +----------+--------+--------+--------+-------------------------+--------+ ICA Prox  100     25      1-39%   calcific and heterogenous         +----------+--------+--------+--------+-------------------------+--------+ ICA Distal53      16                                                +----------+--------+--------+--------+-------------------------+--------+ ECA       112     14                                                +----------+--------+--------+--------+-------------------------+--------+ +----------+--------+-------+----------------+-------------------+  PSV cm/sEDV cmsDescribe        Arm Pressure (mmHG) +----------+--------+-------+----------------+-------------------+ Subclavian140            Multiphasic, WNL                    +----------+--------+-------+----------------+-------------------+ +---------+--------+--+--------+-+---------+ VertebralPSV cm/s33EDV cm/s8Antegrade +---------+--------+--+--------+-+---------+  Left Carotid Findings: +----------+--------+--------+--------+-----------------------+--------+           PSV cm/sEDV  cm/sStenosisPlaque Description     Comments +----------+--------+--------+--------+-----------------------+--------+ CCA Prox  79      17              heterogenous                    +----------+--------+--------+--------+-----------------------+--------+ CCA Distal77      19              heterogenous                    +----------+--------+--------+--------+-----------------------+--------+ ICA Prox  199     60      60-79%  calcific and hypoechoic         +----------+--------+--------+--------+-----------------------+--------+ ICA Mid   139     38                                              +----------+--------+--------+--------+-----------------------+--------+ ICA Distal59      18                                              +----------+--------+--------+--------+-----------------------+--------+ ECA       126     10                                              +----------+--------+--------+--------+-----------------------+--------+ +----------+--------+--------+----------------+-------------------+           PSV cm/sEDV cm/sDescribe        Arm Pressure (mmHG) +----------+--------+--------+----------------+-------------------+ [id number]              Multiphasic, WNL                    +----------+--------+--------+----------------+-------------------+ +---------+--------+--+--------+-+---------+ VertebralPSV cm/s35EDV cm/s6Antegrade +---------+--------+--+--------+-+---------+   Summary: Right Carotid: Velocities in the right ICA are consistent with a 1-39% stenosis. Left Carotid: Velocities in the left ICA are consistent with a 60-79% stenosis. Vertebrals:  Bilateral vertebral arteries demonstrate antegrade flow. Subclavians: Normal flow hemodynamics were seen in bilateral subclavian              arteries. *See table(s) above for measurements and observations.  Electronically signed by Antony Contras MD on 06/20/2021 at 8:15:52 AM.    Final     VAS [place] LOWER EXTREMITY VENOUS (DVT)  Result Date: 06/20/2021  Lower Venous DVT Study Patient Name:  Micheal Donovan  Date of Exam:   06/19/2021 Medical Rec #: [id number]      Accession #:    [id number] Date of Birth: 05-Jun-1950      Patient Gender: M Patient Age:   49 years Exam Location:  [facility] Procedure:      VAS [place] LOWER EXTREMITY VENOUS (DVT) Referring Phys: Jennette Kettle --------------------------------------------------------------------------------  Indications: Pulmonary embolism.  Comparison Study: no prior Performing Technologist: Archie Patten RVS  Examination Guidelines: A complete evaluation includes B-mode imaging, spectral Doppler, color Doppler, and power Doppler as needed of all accessible portions of each vessel. Bilateral testing is considered an integral part of a complete examination. Limited examinations for reoccurring indications may be performed as noted. The reflux portion of the exam is performed with the patient in reverse Trendelenburg.  +---------+---------------+---------+-----------+----------+--------------+ RIGHT    CompressibilityPhasicitySpontaneityPropertiesThrombus Aging +---------+---------------+---------+-----------+----------+--------------+ CFV      Full           Yes      Yes                                 +---------+---------------+---------+-----------+----------+--------------+ SFJ      Full                                                        +---------+---------------+---------+-----------+----------+--------------+ FV Prox  Full                                                        +---------+---------------+---------+-----------+----------+--------------+ FV Mid   Full                                                        +---------+---------------+---------+-----------+----------+--------------+ FV DistalFull                                                         +---------+---------------+---------+-----------+----------+--------------+ PFV      Full                                                        +---------+---------------+---------+-----------+----------+--------------+ POP      Full           Yes      Yes                                 +---------+---------------+---------+-----------+----------+--------------+ PTV      Full                                                        +---------+---------------+---------+-----------+----------+--------------+ PERO     Full                                                        +---------+---------------+---------+-----------+----------+--------------+   +---------+---------------+---------+-----------+----------+--------------+  LEFT     CompressibilityPhasicitySpontaneityPropertiesThrombus Aging +---------+---------------+---------+-----------+----------+--------------+ CFV      Full           Yes      Yes                                 +---------+---------------+---------+-----------+----------+--------------+ SFJ      Full                                                        +---------+---------------+---------+-----------+----------+--------------+ FV Prox  Full                                                        +---------+---------------+---------+-----------+----------+--------------+ FV Mid   Full                                                        +---------+---------------+---------+-----------+----------+--------------+ FV DistalFull                                                        +---------+---------------+---------+-----------+----------+--------------+ PFV      Full                                                        +---------+---------------+---------+-----------+----------+--------------+ POP      Full           Yes      Yes                                  +---------+---------------+---------+-----------+----------+--------------+ PTV      Full                                                        +---------+---------------+---------+-----------+----------+--------------+ PERO     Full                                                        +---------+---------------+---------+-----------+----------+--------------+     Summary: BILATERAL: - No evidence of deep vein thrombosis seen in the lower extremities, bilaterally. -No evidence of popliteal cyst, bilaterally.   *See table(s) above for measurements and observations. Electronically signed by Jamelle Haring on 06/20/2021 at 7:30:40 AM.  Final     Microbiology: Results for orders placed or performed during the hospital encounter of 06/10/21  Resp Panel by RT-PCR (Flu A&B, Covid) Urine, Clean Catch     Status: None   Collection Time: 06/11/21  2:39 AM   Specimen: Urine, Clean Catch; Nasopharyngeal(NP) swabs in vial transport medium  Result Value Ref Range Status   SARS Coronavirus 2 by RT PCR NEGATIVE NEGATIVE Final    Comment: (NOTE) SARS-CoV-2 target nucleic acids are NOT DETECTED.  The SARS-CoV-2 RNA is generally detectable in upper respiratory specimens during the acute phase of infection. The lowest concentration of SARS-CoV-2 viral copies this assay can detect is 138 copies/mL. A negative result does not preclude SARS-Cov-2 infection and should not be used as the sole basis for treatment or other patient management decisions. A negative result may occur with  improper specimen collection/handling, submission of specimen other than nasopharyngeal swab, presence of viral mutation(s) within the areas targeted by this assay, and inadequate number of viral copies(<138 copies/mL). A negative result must be combined with clinical observations, patient history, and epidemiological information. The expected result is Negative.  Fact Sheet for Patients:   EntrepreneurPulse.com.au  Fact Sheet for Healthcare Providers:  IncredibleEmployment.be  This test is no t yet approved or cleared by the [place] FDA and  has been authorized for detection and/or diagnosis of SARS-CoV-2 by FDA under an Emergency Use Authorization (EUA). This EUA will remain  in effect (meaning this test can be used) for the duration of the COVID-19 declaration under Section 564(b)(1) of the Act, 21 U.S.C.section 360bbb-3(b)(1), unless the authorization is terminated  or revoked sooner.       Influenza A by PCR NEGATIVE NEGATIVE Final   Influenza B by PCR NEGATIVE NEGATIVE Final    Comment: (NOTE) The Xpert Xpress SARS-CoV-2/FLU/RSV plus assay is intended as an aid in the diagnosis of influenza from Nasopharyngeal swab specimens and should not be used as a sole basis for treatment. Nasal washings and aspirates are unacceptable for Xpert Xpress SARS-CoV-2/FLU/RSV testing.  Fact Sheet for Patients: EntrepreneurPulse.com.au  Fact Sheet for Healthcare Providers: IncredibleEmployment.be  This test is not yet approved or cleared by the [place] FDA and has been authorized for detection and/or diagnosis of SARS-CoV-2 by FDA under an Emergency Use Authorization (EUA). This EUA will remain in effect (meaning this test can be used) for the duration of the COVID-19 declaration under Section 564(b)(1) of the Act, 21 U.S.C. section 360bbb-3(b)(1), unless the authorization is terminated or revoked.  Performed at [facility], [address], [place], [place] [address]   Expectorated Sputum Assessment w Gram Stain, Rflx to Resp Cult     Status: None   Collection Time: 06/12/21 10:45 PM   Specimen: Expectorated Sputum  Result Value Ref Range Status   Specimen Description EXPECTORATED SPUTUM  Final   Special Requests NONE  Final   Sputum evaluation   Final    Sputum specimen not acceptable  for testing.  Please recollect.   Performed at [facility], [place] [address], [place], [place] [address]    Report Status 06/13/2021 FINAL  Final   Labs: CBC: Recent Labs  Lab 06/15/21 0959 06/17/21 0147 06/18/21 0859 06/19/21 0330 06/20/21 0319  WBC 12.2* 12.2* 12.8* 14.1* 13.1*  NEUTROABS 9.5* 9.1* 10.2* 9.8*  --   HGB 16.2 15.9 15.8 16.2 15.4  HCT 48.3 47.1 47.8 47.0 46.2  MCV 85.6 85.2 86.4 84.2 86.2  PLT 318 337 337 317 273  Basic Metabolic Panel: Recent Labs  Lab 06/14/21 0315 06/15/21 0938 06/17/21 0147 06/18/21 0859 06/19/21 0330 06/20/21 0319  NA 131* 131* 132* 130* 130* 127*  K 3.8 4.5 4.2 4.2 3.8 4.1  CL 102 100 101 96* 98 95*  CO2 22 19* $Remo'23 24 25 22  'mMJHE$ GLUCOSE 190* 192* 137* 251* 161* 206*  BUN $Re'13 14 21 22 17 23  'hyl$ CREATININE 0.65 0.63 0.72 0.73 0.71 0.71  CALCIUM 9.2 9.3 9.5 9.6 9.3 8.8*  MG 2.1 2.0  --  1.8  --  2.0  PHOS  --   --   --   --   --  3.8   Liver Function Tests: Recent Labs  Lab 06/20/21 0319  AST 27  ALT 27  ALKPHOS 120  BILITOT 0.7  PROT 6.2*  ALBUMIN 3.3*   CBG: Recent Labs  Lab 06/19/21 1619 06/19/21 2101 06/20/21 0605 06/20/21 0751 06/20/21 1134  GLUCAP 233* 116* 232* 192* 93   Discharge time spent: greater than 30 minutes.  Signed: Raiford Noble, DO [facility] 06/20/2021

## 2021-06-20 NOTE — Progress Notes (Signed)
OT Cancellation Note ? ?Patient Details ?Name: MARKEISE MATHEWS ?MRN: [id number] ?DOB: 1950-07-13 ? ? ?Cancelled Treatment:    Reason Eval/Treat Not Completed: Patient declined, no reason specified. Pt asleep on OT entry. When OT woke pt up, he stated that he was leaving for [place] today and would like to rest. OT will follow up as time allows.  ? ?Shermar Friedland Elane Yolanda Bonine ?06/20/2021, 11:44 AM ?

## 2021-06-20 NOTE — Progress Notes (Signed)
ANTICOAGULATION CONSULT NOTE ? ?Pharmacy Consult for Heparin ?Indication: pulmonary embolus (s/p CVA with hemorrhagic conversion) ? ?Allergies  ?Allergen Reactions  ? Niaspan [Niacin]   ?  FLUSHING  ? ? ?Patient Measurements: ?Height: 5\' 7"  (170.2 cm) ?Weight: 64.9 kg (143 lb 1.3 oz) ?IBW/kg (Calculated) : 66.1 ? ?Heparin Dosing Weight: 67 kg ? ?Vital Signs: ?Temp: 97.9 ?F (36.6 ?C) (04/20 1912) ?Temp Source: Oral (04/20 1912) ?BP: 142/83 (04/20 1912) ?Pulse Rate: 101 (04/20 1912) ? ?Labs: ?Recent Labs  ?  06/18/21 ?0092 06/19/21 ?0330 06/20/21 ?3300 06/20/21 ?1154 06/20/21 ?2114  ?HGB 15.8 16.2 15.4  --   --   ?HCT 47.8 47.0 46.2  --   --   ?PLT 337 317 273  --   --   ?HEPARINUNFRC  --   --  <0.10* 0.22* 0.25*  ?CREATININE 0.73 0.71 0.71  --   --   ? ? ? ?Estimated Creatinine Clearance: 78.9 mL/min (by C-G formula based on SCr of 0.71 mg/dL). ? ? ? ?Assessment: ?71 yo male who presented for worsening headaches found to have large IPH involving the left cerebellum infarct likely secondary ischemic. Now found to have right lower lobe PE. OK for heparin per neurology, low-dose stroke protocol. ? ?CBC stable, platelets 273. No s/sx bleeding reported. No issue with heparin infusion. Heparin level subtherapeutic at 0.22. Will increase heparin rate. ? ?Heparin level came back subtherapeutic at 0.25 tonight. We will increase the rate and check a level in AM.  ? ?Goal of Therapy:  ?Heparin level 0.3-0.5 units/ml ?Monitor platelets by anticoagulation protocol: Yes ?  ?Plan:  ?Increase heparin infusion to 1150 units/hr ?Check heparin level in AM ?Continue to monitor H&H and platelets ? ?Onnie Boer, PharmD, BCIDP, AAHIVP, CPP ?Infectious Disease Pharmacist ?06/20/2021 9:47 PM ? ? ? ?

## 2021-06-20 NOTE — Progress Notes (Signed)
Inpatient Rehabilitation Admission Medication Review by a Pharmacist ? ?A complete drug regimen review was completed for this patient to identify any potential clinically significant medication issues. ? ?High Risk Drug Classes Is patient taking? Indication by Medication  ?Antipsychotic No   ?Anticoagulant Yes Heparin infusion- PE (HL 0.3 to 0.5)  ?Antibiotic Yes Cefdinir- pneumonia. 2 doses left to complete course  ?Opioid Yes Hycodan, tramadol- acute pain  ?Antiplatelet No   ?Hypoglycemics/insulin Yes Insulin (long and short acting)- T2DM  ?Vasoactive Medication Yes Norvasc- hypertension  ?Chemotherapy No   ?Other Yes Lipitor- HLD ?Flonase- seasonal rhinitis ?Davene Costain- COPD ?Protonix- GERD ?Fioricet- HA  ? ? ? ?Type of Medication Issue Identified Description of Issue Recommendation(s)  ?Drug Interaction(s) (clinically significant) ?    ?Duplicate Therapy ?    ?Allergy ?    ?No Medication Administration End Date ?    ?Incorrect Dose ?    ?Additional Drug Therapy Needed ?    ?Significant med changes from prior encounter (inform family/care partners about these prior to discharge).    ?Other ? PTA meds: ?Aspirin ?Omeprazole ? ?Heparin infusion Restart aspirin as per neurology recommendations (5-7 days d/t carotid stenosis- 06/21/2021) ?Transition back to omeprazole at time of discharge (on protonix in-patient) ? ?Transition to oral anticoagulation vs lovenox based on oncology recommendations at or about day 7 of heparin (started 06/19/2021)  ? ? ?Clinically significant medication issues were identified that warrant physician communication and completion of prescribed/recommended actions by midnight of the next day:  No ? ? ?Time spent performing this drug regimen review (minutes):  30 ? ? ?Shamaine Mulkern BS, PharmD, BCPS ?Clinical Pharmacist ?06/20/2021 12:46 PM ? ?Contact: [phone] after 3 PM ? ?"Be curious, not judgmental..." -Jamal Maes ?

## 2021-06-21 DIAGNOSIS — L8990 Pressure ulcer of unspecified site, unspecified stage: Secondary | ICD-10-CM | POA: Insufficient documentation

## 2021-06-21 LAB — COMPREHENSIVE METABOLIC PANEL
ALT: 27 U/L (ref 0–44)
AST: 20 U/L (ref 15–41)
Albumin: 3.2 g/dL — ABNORMAL LOW (ref 3.5–5.0)
Alkaline Phosphatase: 122 U/L (ref 38–126)
Anion gap: 8 (ref 5–15)
BUN: 14 mg/dL (ref 8–23)
CO2: 23 mmol/L (ref 22–32)
Calcium: 9.2 mg/dL (ref 8.9–10.3)
Chloride: 102 mmol/L (ref 98–111)
Creatinine, Ser: 0.61 mg/dL (ref 0.61–1.24)
GFR, Estimated: >60 mL/min (ref >60)
Glucose, Bld: 96 mg/dL (ref 70–99)
Potassium: 3.8 mmol/L (ref 3.5–5.1)
Sodium: 133 mmol/L — ABNORMAL LOW (ref 135–145)
Total Bilirubin: 0.6 mg/dL (ref 0.3–1.2)
Total Protein: 6.7 g/dL (ref 6.5–8.1)

## 2021-06-21 LAB — CBC WITH DIFFERENTIAL/PLATELET
Abs Immature Granulocytes: 0.12 10*3/uL — ABNORMAL HIGH (ref 0.00–0.07)
Basophils Absolute: 0 10*3/uL (ref 0.0–0.1)
Basophils Relative: 0 %
Eosinophils Absolute: 0.3 10*3/uL (ref 0.0–0.5)
Eosinophils Relative: 2 %
HCT: 45 % (ref 39.0–52.0)
Hemoglobin: 15.5 g/dL (ref 13.0–17.0)
Immature Granulocytes: 1 %
Lymphocytes Relative: 19 %
Lymphs Abs: 2.5 10*3/uL (ref 0.7–4.0)
MCH: 29.2 pg (ref 26.0–34.0)
MCHC: 34.4 g/dL (ref 30.0–36.0)
MCV: 84.9 fL (ref 80.0–100.0)
Monocytes Absolute: 0.9 10*3/uL (ref 0.1–1.0)
Monocytes Relative: 7 %
Neutro Abs: 9.4 10*3/uL — ABNORMAL HIGH (ref 1.7–7.7)
Neutrophils Relative %: 71 %
Platelets: 284 10*3/uL (ref 150–400)
RBC: 5.3 MIL/uL (ref 4.22–5.81)
RDW: 12.9 % (ref 11.5–15.5)
WBC: 13.3 10*3/uL — ABNORMAL HIGH (ref 4.0–10.5)
nRBC: 0 % (ref 0.0–0.2)

## 2021-06-21 LAB — GLUCOSE, CAPILLARY
Glucose-Capillary: 98 mg/dL (ref 70–99)
Glucose-Capillary: 93 mg/dL (ref 70–99)
Glucose-Capillary: 72 mg/dL (ref 70–99)
Glucose-Capillary: 246 mg/dL — ABNORMAL HIGH (ref 70–99)

## 2021-06-21 LAB — HEPARIN LEVEL (UNFRACTIONATED)
Heparin Unfractionated: 0.31 IU/mL (ref 0.30–0.70)
Heparin Unfractionated: 0.28 IU/mL — ABNORMAL LOW (ref 0.30–0.70)
Heparin Unfractionated: 0.35 IU/mL (ref 0.30–0.70)

## 2021-06-21 LAB — MAGNESIUM: Magnesium: 2.1 mg/dL (ref 1.7–2.4)

## 2021-06-21 LAB — VITAMIN D 25 HYDROXY (VIT D DEFICIENCY, FRACTURES): Vit D, 25-Hydroxy: 35.27 ng/mL (ref 30–100)

## 2021-06-21 MED ORDER — VITAMIN D (ERGOCALCIFEROL) 1.25 MG (50000 UNIT) PO CAPS
50000.0000 [IU] | ORAL_CAPSULE | ORAL | Status: DC
  Administered 2021-06-21: 50000 [IU] via ORAL
  Filled 2021-06-21: qty 1

## 2021-06-21 MED ORDER — TRAZODONE HCL 50 MG PO TABS
50.0000 mg | ORAL_TABLET | Freq: Every evening | ORAL | Status: DC | PRN
  Administered 2021-06-24 – 2021-06-25 (×2): 50 mg via ORAL
  Filled 2021-06-21 (×3): qty 1

## 2021-06-21 MED ORDER — METOPROLOL SUCCINATE ER 25 MG PO TB24
12.5000 mg | ORAL_TABLET | Freq: Every day | ORAL | Status: DC
  Administered 2021-06-21 – 2021-06-27 (×7): 12.5 mg via ORAL
  Filled 2021-06-21 (×7): qty 1

## 2021-06-21 MED ORDER — METOPROLOL TARTRATE 12.5 MG HALF TABLET: 12.5000 mg | ORAL_TABLET | Freq: Every day | ORAL | Status: DC

## 2021-06-21 MED ORDER — FLUTICASONE PROPIONATE 50 MCG/ACT NA SUSP
1.0000 | Freq: Two times a day (BID) | NASAL | Status: DC
  Administered 2021-06-21 – 2021-06-27 (×12): 1 via NASAL
  Filled 2021-06-21: qty 16

## 2021-06-21 MED ORDER — SALINE SPRAY 0.65 % NA SOLN
1.0000 | Freq: Two times a day (BID) | NASAL | Status: DC
  Administered 2021-06-21 – 2021-06-27 (×5): 1 via NASAL
  Filled 2021-06-21: qty 44

## 2021-06-21 MED ORDER — MECLIZINE HCL 25 MG PO TABS: 12.5000 mg | ORAL_TABLET | Freq: Two times a day (BID) | ORAL | Status: DC | PRN

## 2021-06-21 NOTE — Progress Notes (Signed)
Occupational Therapy Session Note ? ?Patient Details  ?Name: Micheal Donovan ?MRN: [id number] ?Date of Birth: 1950/10/19 ? ?Today's Date: 06/21/2021 ?OT Individual Time: 9292-4462 ?OT Individual Time Calculation (min): 43 min  and Today's Date: 06/21/2021 ?OT Missed Time: 17 Minutes ?Missed Time Reason: Pain (dizziness/head throbbing) ? ? ?Short Term Goals: ?Week 1:  OT Short Term Goal 1 (Week 1): STG = LTG 2/2 ELOS ? ?Skilled Therapeutic Interventions/Progress Updates:  ?Skilled OT intervention completed with focus on UE unsupported sitting, activity tolerance, BUE strengthening, stroke education. Pt received upright in bed, reporting 7/10 pain, premedicated, however motivated to attempt therapy. Therapist provided education regarding stroke damage to cerebellum, typical symptoms, as well as vestibular changes that can occur, with therapist informing pt of plan for vestibular PT to see him to see if dizziness symptoms can improve with treatment. Transitioned to EOB with HOB elevated, with report of 8/10 dizziness that had little improvement upon rest. Pt agreeable to attempt UB exercises EOB as pt declined getting up further at this time. Of note- pt prefers to keep his eyes closed to prevent further dizziness however was educated on using focal point try and assist with "room spinning" sensation.  ? ?While EOB, pt required supervision for UE unsupported sitting balance, with minor trunk swaying noted. Completed the following while EOB before needing to finish at bed level: ?-Shoulder horizontal abduction ?-Bicep flexion ?-Shoulder flexion ? ?Reported need to finish laying down, with pt completing bed mobility with mod I, and then completing the following exercises at bed level to promote strength needed for functional tasks: ?(With yellow theraband anchored on bed rail) 10 reps each arm ?Chest presses ?Shoulder external/internal rotation ?Shoulder extension ?Tricep extension ? ?Pt was left upright in bed, with bed alarm  on and all needs in reach at end of session. ? ? ? ?Therapy Documentation ?Precautions:  ?Precautions ?Precautions: Fall ?Precaution Comments: dizziness ?Restrictions ?Weight Bearing Restrictions: No ? ? ? ?Therapy/Group: Individual Therapy ? ?[name] [name] ?06/21/2021, 7:35 AM ?

## 2021-06-21 NOTE — Evaluation (Signed)
Physical Therapy Assessment and Plan ? ?Patient Details  ?Name: Micheal Donovan ?MRN: [id number] ?Date of Birth: 03/23/1950 ? ?PT Diagnosis: Coordination disorder, Difficulty walking, Dizziness and giddiness, Muscle weakness, and Pain in head ?Rehab Potential: Good ?ELOS: 7-10 days  ? ?Today's Date: 06/21/2021 ?PT Individual Time: 6270-3500 ?PT Individual Time Calculation (min): 24 min  ? Today's Date: 06/21/2021 ?PT Missed Time: 36 Minutes ?Missed Time Reason: Other (Comment) (dizziness)  ? ?Hospital Problem: Principal Problem: ?  Intraparenchymal hemorrhage of brain ([place]) ? ? ?Past Medical History:  ?Past Medical History:  ?Diagnosis Date  ? Allergy   ? BPH (benign prostatic hypertrophy)   ? Cancer [facility])   ? Melanoma on Neck    2008  ? Carotid artery occlusion   ? Diabetes mellitus   ? ED (erectile dysfunction)   ? Hyperlipidemia   ? Hypertension   ? Retinopathy due to secondary DM ([place])   ? ?Past Surgical History:  ?Past Surgical History:  ?Procedure Laterality Date  ? BRONCHIAL NEEDLE ASPIRATION BIOPSY  06/17/2021  ? Procedure: BRONCHIAL NEEDLE ASPIRATION BIOPSIES;  Surgeon: Candee Furbish, MD;  Location: [facility] ENDOSCOPY;  Service: Pulmonary;;  ? MELANOMA EXCISION  2008  ? Left side of neck  ? RADIOLOGY WITH ANESTHESIA N/A 04/05/2020  ? Procedure: MRI SPINE WITOUT CONTRAST;  Surgeon: Radiologist, Medication, MD;  Location: [place];  Service: Radiology;  Laterality: N/A;  ? TONSILLECTOMY    ? VIDEO BRONCHOSCOPY WITH ENDOBRONCHIAL ULTRASOUND N/A 06/17/2021  ? Procedure: VIDEO BRONCHOSCOPY WITH ENDOBRONCHIAL ULTRASOUND;  Surgeon: Candee Furbish, MD;  Location: [facility] ENDOSCOPY;  Service: Pulmonary;  Laterality: N/A;  ? ? ?Assessment & Plan ?Clinical Impression: Patient is a 71 y.o. year old male with history of type 2 diabetes mellitus with diabetic retinopathy as well as medical noncompliance, hypertension, hyperlipidemia, carotid ultrasound 2014 bilateral ICA stenosis 60 to 79%, quit smoking 30 years ago.  Per chart review  patient lives with spouse.  1 level home 3 steps to entry.  Independent prior to admission.  Presented 06/10/2021 to [facility] for progressive headache dizziness and gait ataxia x1 week.[name]  He initially went to see his primary doctor initial thought was sinus headache placed on steroids and antibiotics.  Cranial CT scan showed acute to subacute intraparenchymal hemorrhage involving the left cerebellum measuring 3.3 x 2.7 x 4.3 cm.  Surrounding low-density vasogenic edema with mild regional mass effect.  Partial effacement of the fourth ventricle with no hydrocephalus.  He was transferred to [facility].  CT angiogram head and neck negative for large vessel occlusion.  Bulky calcified plaque about the left carotid bulb/proximal cervical left ICA with associated stenosis of up to 75% by NASCET criteria.  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  Finding was suspected to be incidental in nature.  Chest x-ray showed a 4.4 cm left midlung pulmonary mass concerning for malignancy.  MRI of the brain follow-up 06/12/2021 no significant interval change in size of left cerebellar intraparenchymal hematoma.  Admission chemistries unremarkable except WBC 12,200, hemoglobin 18.1, glucose 393, hemoglobin A1c 13.6, sodium 132, BUN 32.  Patient did require insulin drip in the ED for management of blood sugars.  Echocardiogram with ejection fraction of 55 to 60% no wall motion abnormalities grade 1 diastolic dysfunction.  Neurology follow-up in regards to intraparenchymal hemorrhage close monitoring of blood pressure.  Recommendations were made to begin aspirin 81 mg daily due to carotid stenosis in 5 to 7 days beginning approximately 06/21/2021.  He was started on a Medrol Dosepak per neurology for his headaches.  In regards to patient's findings of pulmonary mass noted on chest x-ray.  CT of chest completed 06/13/2021 showing 4.3 x 3.6 x 3.3 cm lobular spiculated mass in the posterior left upper lobe  with teetering to the lateral pleural and major fissure with imagings consistent with primary bronchogenic neoplasm.  Urine Legionella antigen negative, urine strep pneumococcus antigen negative.  Pulmonary services consulted in regards to pulmonary mass patient initially reluctant and underwent bronchoscopy 06/17/2021 per Dr. Ina Homes showing no subcarinal adenopathy by EBUS [place].  Left hilar node sampled x6 and current plans to follow-up outpatient with oncology services.  During work-up of pulmonary mass CT/AP completed to look for evidence of metastatic disease showing no acute intra-abdominal or pelvic pathology however noting findings of RLL pulmonary embolism no evidence of RHS.  Venous Doppler studies lower extremities negative for DVT.  A follow-up cranial CT scan was completed to establish anticoagulation for follow-up of left cerebellar hemorrhagic conversion showed mild contraction of hematoma no hydrocephalus.  Patient was cleared by neurology service for low-dose intensity heparin intravenously without bolus.  Therapy evaluations completed due to patient decreased functional mobility was admitted for a comprehensive rehab program. Currently complains of blurred vision. ? ?Patient currently requires min with mobility secondary to muscle weakness, decreased cardiorespiratoy endurance, vestibular impairments, and decreased standing balance, decreased postural control, and decreased balance strategies. Prior to hospitalization, patient was independent  with mobility and lived with Spouse in a House home.  Home access is 3 to get onto deck in back yard, 1 step in the garageStairs to enter. ? ?Patient will benefit from skilled PT intervention to maximize safe functional mobility, minimize fall risk, and decrease caregiver burden for planned discharge home with intermittent assist.  Anticipate patient will benefit from follow up [facility] at discharge. ? ?PT - End of Session ?Activity Tolerance: Decreased this  session (due to dizziness) ?Endurance Deficit: Yes ?Endurance Deficit Description: pt unable to tolerate any activity more than standing due to dizziness ?PT Assessment ?Rehab Potential (ACUTE/IP ONLY): Good ?PT Barriers to Discharge: Inaccessible home environment;Home environment access/layout;Other (comments) ?PT Barriers to Discharge Comments: headache, dizziness, steps to enter home ?PT Patient demonstrates impairments in the following area(s): Balance;Endurance;Motor;Pain ?PT Transfers Functional Problem(s): Bed Mobility;Bed to Chair;Car;Furniture ?PT Locomotion Functional Problem(s): Ambulation;Wheelchair Mobility;Stairs ?PT Plan ?PT Intensity: Minimum of 1-2 x/day ,45 to 90 minutes ?PT Frequency: 5 out of 7 days ?PT Duration Estimated Length of Stay: 7-10 days ?PT Treatment/Interventions: Ambulation/gait training;Discharge planning;Functional mobility training;Therapeutic Activities;Visual/perceptual remediation/compensation;Balance/vestibular training;Disease management/prevention;Neuromuscular re-education;Skin care/wound management;Therapeutic Exercise;Wheelchair propulsion/positioning;Cognitive remediation/compensation;DME/adaptive equipment instruction;Pain management;UE/LE Strength taining/ROM;Splinting/orthotics;Community reintegration;Patient/family education;Stair training;UE/LE Coordination activities;Psychosocial support ?PT Transfers Anticipated Outcome(s): Mod I with LRAD ?PT Locomotion Anticipated Outcome(s): Mod I with LRAD ?PT Recommendation ?Recommendations for Other Services: Vestibular eval ?Follow Up Recommendations: Home health PT ?Patient destination: Home ?Equipment Recommended: To be determined ?Equipment Details: has RW ? ?PT Evaluation ?Precautions/Restrictions ?Precautions ?Precautions: Fall ?Precaution Comments: dizziness ?Restrictions ?Weight Bearing Restrictions: No ?Pain Interference ?Pain Interference ?Pain Effect on Sleep: 3. Frequently ?Pain Interference with Therapy  Activities: 3. Frequently ?Pain Interference with Day-to-Day Activities: 3. Frequently ?Home Living/Prior Functioning ?Home Living ?Living Arrangements: Spouse/significant other ?Available Help at Discharge: Family;Ava

## 2021-06-21 NOTE — Evaluation (Signed)
Occupational Therapy Assessment and Plan ? ?Patient Details  ?Name: Micheal Donovan ?MRN: [id number] ?Date of Birth: Feb 22, 1951 ? ?OT Diagnosis: abnormal posture, ataxia, disturbance of vision, and generalized weakness, dizziness ?Rehab Potential: Rehab Potential (ACUTE ONLY): Good ?ELOS: 7-10 days  ? ?Today's Date: 06/21/2021 ?OT Individual Time: 7253-6644 ?OT Individual Time Calculation (min): 54 min    ? ?Hospital Problem: Principal Problem: ?  Intraparenchymal hemorrhage of brain ([place]) ? ? ?Past Medical History:  ?Past Medical History:  ?Diagnosis Date  ? Allergy   ? BPH (benign prostatic hypertrophy)   ? Cancer [facility])   ? Melanoma on Neck    2008  ? Carotid artery occlusion   ? Diabetes mellitus   ? ED (erectile dysfunction)   ? Hyperlipidemia   ? Hypertension   ? Retinopathy due to secondary DM ([place])   ? ?Past Surgical History:  ?Past Surgical History:  ?Procedure Laterality Date  ? BRONCHIAL NEEDLE ASPIRATION BIOPSY  06/17/2021  ? Procedure: BRONCHIAL NEEDLE ASPIRATION BIOPSIES;  Surgeon: Candee Furbish, MD;  Location: [facility] ENDOSCOPY;  Service: Pulmonary;;  ? MELANOMA EXCISION  2008  ? Left side of neck  ? RADIOLOGY WITH ANESTHESIA N/A 04/05/2020  ? Procedure: MRI SPINE WITOUT CONTRAST;  Surgeon: Radiologist, Medication, MD;  Location: [place];  Service: Radiology;  Laterality: N/A;  ? TONSILLECTOMY    ? VIDEO BRONCHOSCOPY WITH ENDOBRONCHIAL ULTRASOUND N/A 06/17/2021  ? Procedure: VIDEO BRONCHOSCOPY WITH ENDOBRONCHIAL ULTRASOUND;  Surgeon: Candee Furbish, MD;  Location: [facility] ENDOSCOPY;  Service: Pulmonary;  Laterality: N/A;  ? ? ?Assessment & Plan ?Clinical Impression:  ? ?Micheal Donovan is a 71 year old right-handed male with history of type 2 diabetes mellitus with diabetic retinopathy as well as medical noncompliance, hypertension, hyperlipidemia, carotid ultrasound 2014 bilateral ICA stenosis 60 to 79%, quit smoking 30 years ago.  Per chart review patient lives with spouse.  1 level home 3 steps to entry.  Independent  prior to admission.  Presented 06/10/2021 to [facility] for progressive headache dizziness and gait ataxia x1 week.[name]  He initially went to see his primary doctor initial thought was sinus headache placed on steroids and antibiotics.  Cranial CT scan showed acute to subacute intraparenchymal hemorrhage involving the left cerebellum measuring 3.3 x 2.7 x 4.3 cm.  Surrounding low-density vasogenic edema with mild regional mass effect.  Partial effacement of the fourth ventricle with no hydrocephalus.  He was transferred to [facility].  CT angiogram head and neck negative for large vessel occlusion.  Bulky calcified plaque about the left carotid bulb/proximal cervical left ICA with associated stenosis of up to 75% by NASCET criteria.  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  Finding was suspected to be incidental in nature.  Chest x-ray showed a 4.4 cm left midlung pulmonary mass concerning for malignancy.  MRI of the brain follow-up 06/12/2021 no significant interval change in size of left cerebellar intraparenchymal hematoma.  Admission chemistries unremarkable except WBC 12,200, hemoglobin 18.1, glucose 393, hemoglobin A1c 13.6, sodium 132, BUN 32.  Patient did require insulin drip in the ED for management of blood sugars.  Echocardiogram with ejection fraction of 55 to 60% no wall motion abnormalities grade 1 diastolic dysfunction.  Neurology follow-up in regards to intraparenchymal hemorrhage close monitoring of blood pressure.  Recommendations were made to begin aspirin 81 mg daily due to carotid stenosis in 5 to 7 days beginning approximately 06/21/2021.  He was started on a Medrol Dosepak per neurology for his  headaches.  In regards to patient's findings of pulmonary mass noted on chest x-ray.  CT of chest completed 06/13/2021 showing 4.3 x 3.6 x 3.3 cm lobular spiculated mass in the posterior left upper lobe with teetering to the lateral pleural and major fissure with imagings  consistent with primary bronchogenic neoplasm.  Urine Legionella antigen negative, urine strep pneumococcus antigen negative.  Pulmonary services consulted in regards to pulmonary mass patient initially reluctant and underwent bronchoscopy 06/17/2021 per Dr. Ina Homes showing no subcarinal adenopathy by EBUS [place].  Left hilar node sampled x6 and current plans to follow-up outpatient with oncology services.  During work-up of pulmonary mass CT/AP completed to look for evidence of metastatic disease showing no acute intra-abdominal or pelvic pathology however noting findings of RLL pulmonary embolism no evidence of RHS.  Venous Doppler studies lower extremities negative for DVT.  A follow-up cranial CT scan was completed to establish anticoagulation for follow-up of left cerebellar hemorrhagic conversion showed mild contraction of hematoma no hydrocephalus.  Patient was cleared by neurology service for low-dose intensity heparin intravenously without bolus.  Therapy evaluations completed due to patient decreased functional mobility was admitted for a comprehensive rehab program. Currently complains of blurred vision. Patient transferred to CIR on 06/20/2021 .   ? ?Patient currently requires mod with basic self-care skills secondary to muscle weakness, decreased cardiorespiratoy endurance, decreased visual acuity, and decreased sitting balance and decreased standing balance.  Prior to hospitalization, patient could complete all self-care independently including functional ambulation. ? ?Patient will benefit from skilled intervention to decrease level of assist with basic self-care skills and increase independence with basic self-care skills prior to discharge home with care partner.  Anticipate patient will require intermittent supervision and follow up outpatient. ? ?OT - End of Session ?Activity Tolerance: Tolerates < 10 min activity, no significant change in vital signs ?Endurance Deficit: Yes ?Endurance Deficit  Description: pt unable to tolerate leaning forward for ADLs and limited with transfers ?OT Assessment ?Rehab Potential (ACUTE ONLY): Good ?OT Barriers to Discharge: Home environment access/layout ?OT Patient demonstrates impairments in the following area(s): Balance;Endurance;Motor;Pain;Safety;Vision ?OT Basic ADL's Functional Problem(s): Grooming;Bathing;Dressing;Toileting ?OT Transfers Functional Problem(s): Toilet;Tub/Shower ?OT Additional Impairment(s): None ?OT Plan ?OT Intensity: Minimum of 1-2 x/day, 45 to 90 minutes ?OT Frequency: 5 out of 7 days ?OT Duration/Estimated Length of Stay: 7-10 days ?OT Treatment/Interventions: Balance/vestibular training;Discharge planning;Pain management;Self Care/advanced ADL retraining;Therapeutic Activities;UE/LE Coordination activities;Functional mobility training;Patient/family education;Therapeutic Exercise;Visual/perceptual remediation/compensation;DME/adaptive equipment instruction;UE/LE Strength taining/ROM ?OT Self Feeding Anticipated Outcome(s): Indep ?OT Basic Self-Care Anticipated Outcome(s): Mod I ?OT Toileting Anticipated Outcome(s): Mod I ?OT Bathroom Transfers Anticipated Outcome(s): Mod I-supervision ?OT Recommendation ?Recommendations for Other Services: Vestibular eval ?Patient destination: Home ?Follow Up Recommendations: Home health OT ?Equipment Recommended: To be determined ? ? ?OT Evaluation ?Precautions/Restrictions  ?Precautions ?Precautions: Fall ?Restrictions ?Weight Bearing Restrictions: No ?Home Living/Prior Functioning ?Home Living ?Family/patient expects to be discharged to:: Private residence ?Living Arrangements: Spouse/significant other ?Available Help at Discharge: Family, Available 24 hours/day ?Type of Home: House ?Home Access: Stairs to enter ?Entrance Stairs-Number of Steps: 3 to get onto deck in back yard, 1 step in the garage ?Entrance Stairs-Rails: None ?Home Layout: One level ?Bathroom Shower/Tub: Gaffer, Curtain ?Bathroom  Toilet: Standard ?Bathroom Accessibility: Yes ? Lives With: Spouse ?IADL History ?Homemaking Responsibilities: No ?Current License: Yes ?Mode of Transportation: Car ?Education: high school, then 2 years trade

## 2021-06-21 NOTE — Progress Notes (Signed)
ANTICOAGULATION CONSULT NOTE ? ?Pharmacy Consult for Heparin ?Indication: pulmonary embolus (s/p CVA with hemorrhagic conversion) ? ?Allergies  ?Allergen Reactions  ? Niaspan [Niacin]   ?  FLUSHING  ? ? ?Patient Measurements: ?Height: 5\' 7"  (170.2 cm) ?Weight: 64.9 kg (143 lb 1.3 oz) ?IBW/kg (Calculated) : 66.1 ? ?Heparin Dosing Weight: 67 kg ? ?Vital Signs: ?Temp: 98.2 ?F (36.8 ?C) (04/21 1323) ?Temp Source: Oral (04/21 1323) ?BP: 123/78 (04/21 1323) ?Pulse Rate: 82 (04/21 1323) ? ?Labs: ?Recent Labs  ?  06/19/21 ?0330 06/20/21 ?1950 06/20/21 ?1154 06/21/21 ?0505 06/21/21 ?1100 06/21/21 ?1856  ?HGB 16.2 15.4  --  15.5  --   --   ?HCT 47.0 46.2  --  45.0  --   --   ?PLT 317 273  --  284  --   --   ?HEPARINUNFRC  --  <0.10*   < > 0.31 0.28* 0.35  ?CREATININE 0.71 0.71  --  0.61  --   --   ? < > = values in this interval not displayed.  ? ? ? ?Estimated Creatinine Clearance: 78.9 mL/min (by C-G formula based on SCr of 0.61 mg/dL). ? ? ? ?Assessment: ?71 yo male who presented for worsening headaches found to have large IPH involving the left cerebellum infarct likely secondary ischemic. Now found to have right lower lobe PE. OK for heparin per neurology, low-dose stroke protocol. ? ?CBC stable, platelets 284. Message RN and no s/sx bleeding reported or line occlusions. No issue with heparin infusion. Heparin level subtherapeutic at 0.28 ?Will increase heparin rate. ? ?Heparin level came back therapeutic tonight. We will continue with the same rate and check in AM.  ? ?Goal of Therapy:  ?Heparin level 0.3-0.5 units/ml ?Monitor platelets by anticoagulation protocol: Yes ?  ?Plan:  ?Cont heparin 1200 units/hr ?Daily HL and CBC ? ?Onnie Boer, PharmD, BCIDP, AAHIVP, CPP ?Infectious Disease Pharmacist ?06/21/2021 7:35 PM ? ? ? ? ? ?

## 2021-06-21 NOTE — Progress Notes (Signed)
Inpatient Rehabilitation  Patient information reviewed and entered into eRehab system by Yvaine Jankowiak M. Eduardo Honor, M.A., CCC/SLP, PPS Coordinator.  Information including medical coding, functional ability and quality indicators will be reviewed and updated through discharge.    

## 2021-06-21 NOTE — Progress Notes (Signed)
ANTICOAGULATION CONSULT NOTE ? ?Pharmacy Consult for Heparin ?Indication: pulmonary embolus (s/p CVA with hemorrhagic conversion) ? ?Allergies  ?Allergen Reactions  ? Niaspan [Niacin]   ?  FLUSHING  ? ? ?Patient Measurements: ?Height: 5\' 7"  (170.2 cm) ?Weight: 64.9 kg (143 lb 1.3 oz) ?IBW/kg (Calculated) : 66.1 ? ?Heparin Dosing Weight: 67 kg ? ?Vital Signs: ?Temp: 97.9 ?F (36.6 ?C) (04/20 1912) ?Temp Source: Oral (04/20 1912) ?BP: 142/83 (04/20 1912) ?Pulse Rate: 101 (04/20 1912) ? ?Labs: ?Recent Labs  ?  06/18/21 ?9741 06/19/21 ?0330 06/19/21 ?0330 06/20/21 ?6384 06/20/21 ?1154 06/20/21 ?2114 06/21/21 ?0505  ?HGB 15.8 16.2  --  15.4  --   --  15.5  ?HCT 47.8 47.0  --  46.2  --   --  45.0  ?PLT 337 317  --  273  --   --  284  ?HEPARINUNFRC  --   --    < > <0.10* 0.22* 0.25* 0.31  ?CREATININE 0.73 0.71  --  0.71  --   --   --   ? < > = values in this interval not displayed.  ? ? ? ?Estimated Creatinine Clearance: 78.9 mL/min (by C-G formula based on SCr of 0.71 mg/dL). ? ? ? ?Assessment: ?71 yo male who presented for worsening headaches found to have large IPH involving the left cerebellum infarct likely secondary ischemic. Now found to have right lower lobe PE. OK for heparin per neurology, low-dose stroke protocol. ? ?CBC stable, platelets 273. No s/sx bleeding reported. No issue with heparin infusion. Heparin level subtherapeutic at 0.22. Will increase heparin rate. ? ?4/21 AM update:  ?Heparin level therapeutic  ? ?Goal of Therapy:  ?Heparin level 0.3-0.5 units/ml ?Monitor platelets by anticoagulation protocol: Yes ?  ?Plan:  ?Cont heparin 1150 units/hr ?1400 heparin level ? ?Narda Bonds, PharmD, BCPS ?Clinical Pharmacist ?Phone: [phone] ? ? ? ?

## 2021-06-21 NOTE — Progress Notes (Signed)
Inpatient Rehabilitation Center ?Individual Statement of Services ? ?Patient Name:  Micheal Donovan  ?Date:  06/21/2021 ? ?Welcome to the [facility].  Our goal is to provide you with an individualized program based on your diagnosis and situation, designed to meet your specific needs.  With this comprehensive rehabilitation program, you will be expected to participate in at least 3 hours of rehabilitation therapies Monday-Friday, with modified therapy programming on the weekends. ? ?Your rehabilitation program will include the following services:  Physical Therapy (PT), Occupational Therapy (OT), Speech Therapy (ST), 24 hour per day rehabilitation nursing, Therapeutic Recreaction (TR), Neuropsychology, Care Coordinator, Rehabilitation Medicine, Nutrition Services, Pharmacy Services, and Other ? ?Weekly team conferences will be held on Wednesdays to discuss your progress.  Your Inpatient Rehabilitation Care Coordinator will talk with you frequently to get your input and to update you on team discussions.  Team conferences with you and your family in attendance may also be held. ? ?Expected length of stay: 7-9 Days  Overall anticipated outcome:  Supervision to MOD I ? ?Depending on your progress and recovery, your program may change. Your Inpatient Rehabilitation Care Coordinator will coordinate services and will keep you informed of any changes. Your Inpatient Rehabilitation Care Coordinator's name and contact numbers are listed  below. ? ?The following services may also be recommended but are not provided by the [facility]:  ? ?Home Health Rehabiltiation Services ?Outpatient Rehabilitation Services ? ?  ?Arrangements will be made to provide these services after discharge if needed.  Arrangements include referral to agencies that provide these services. ? ?Your insurance has been verified to be:   HTA ?Your primary doctor is:  Jenna Luo, MD ? ?Pertinent information will be  shared with your doctor and your insurance company. ? ?Inpatient Rehabilitation Care Coordinator:  Erlene Quan, [place] or [phone] ? ?Information discussed with and copy given to patient by: Dyanne Iha, 06/21/2021, 10:19 AM    ?

## 2021-06-21 NOTE — Progress Notes (Addendum)
ANTICOAGULATION CONSULT NOTE ? ?Pharmacy Consult for Heparin ?Indication: pulmonary embolus (s/p CVA with hemorrhagic conversion) ? ?Allergies  ?Allergen Reactions  ? Niaspan [Niacin]   ?  FLUSHING  ? ? ?Patient Measurements: ?Height: 5\' 7"  (170.2 cm) ?Weight: 64.9 kg (143 lb 1.3 oz) ?IBW/kg (Calculated) : 66.1 ? ?Heparin Dosing Weight: 67 kg ? ?Vital Signs: ?Temp: 97.7 ?F (36.5 ?C) (04/21 3149) ?BP: 141/87 (04/21 7026) ?Pulse Rate: 79 (04/21 3785) ? ?Labs: ?Recent Labs  ?  06/19/21 ?0330 06/20/21 ?8850 06/20/21 ?1154 06/20/21 ?2114 06/21/21 ?0505 06/21/21 ?1100  ?HGB 16.2 15.4  --   --  15.5  --   ?HCT 47.0 46.2  --   --  45.0  --   ?PLT 317 273  --   --  284  --   ?HEPARINUNFRC  --  <0.10*   < > 0.25* 0.31 0.28*  ?CREATININE 0.71 0.71  --   --  0.61  --   ? < > = values in this interval not displayed.  ? ? ? ?Estimated Creatinine Clearance: 78.9 mL/min (by C-G formula based on SCr of 0.61 mg/dL). ? ? ? ?Assessment: ?71 yo male who presented for worsening headaches found to have large IPH involving the left cerebellum infarct likely secondary ischemic. Now found to have right lower lobe PE. OK for heparin per neurology, low-dose stroke protocol. ? ?CBC stable, platelets 284. Message RN and no s/sx bleeding reported or line occlusions. No issue with heparin infusion. Heparin level subtherapeutic at 0.28 ?Will increase heparin rate. ? ?4/21 12:30 PM update:  ?Heparin level subtherapeutic  ? ?Goal of Therapy:  ?Heparin level 0.3-0.5 units/ml ?Monitor platelets by anticoagulation protocol: Yes ?  ?Plan:  ?Increase heparin to 1200 units/hr ?1900 heparin level ? ?Dani Anastasovites BS ?Pharmacy Student ?06/21/2021 12:47 PM ? ?"When you reach the end of your rope, tie a not in it and hang on" - Franklin D. Roosevelt ? ?Agree with the contents of this note ? ?Ladeja Pelham BS, PharmD, BCPS ?Clinical Pharmacist ?06/21/2021 12:56 PM ? ?Contact: [phone] after 3 PM ? ?"Be curious, not judgmental..." -Jamal Maes ? ?

## 2021-06-21 NOTE — Progress Notes (Signed)
PROGRESS NOTE   Subjective/Complaints: No new complaints this morning Appears fatigued WBC slightly increased Discussed his HgbA1c with him  ROS: +fatigue  Objective:   CT HEAD WO CONTRAST (5MM)  Result Date: 06/19/2021 CLINICAL DATA:  LEFT cerebellar hemorrhage. EXAM: CT HEAD WITHOUT CONTRAST TECHNIQUE: Contiguous axial images were obtained from the base of the skull through the vertex without intravenous contrast. RADIATION DOSE REDUCTION: This exam was performed according to the departmental dose-optimization program which includes automated exposure control, adjustment of the mA and/or kV according to patient size and/or use of iterative reconstruction technique. COMPARISON:  CT 06/11/2021, MRI 06/12/2021 FINDINGS: Brain: Again demonstrated intraparenchymal hemorrhage within the LEFT cerebellum. There is effacement of fourth ventricle not changed from prior. Mild vasogenic edema towards the cerebral peduncles. No increase in volume of the hematoma measuring approximately 3.5 x 2.3 cm compared with 3.8 by 2.1 cm. No hydrocephalus. Third ventricle and lateral ventricles unchanged in volume. No intraparenchymal extra-axial hemorrhage elsewhere. Vascular: No hyperdense vessel or unexpected calcification. Skull: Normal. Negative for fracture or focal lesion. Sinuses/Orbits: Fluid within the maxillary sinuses is increased in the interval. Other: IMPRESSION: 1. Slight contraction of the LEFT cerebellar intraparenchymal hemorrhage. 2. Persistent effacement of the fourth ventricle with no hydrocephalus identified. No change in ventricular volume compared to prior. 3. No new intraparenchymal or extra-axial hemorrhage. 4. Maxillary sinusitis. Electronically Signed   By: Suzy Bouchard M.D.   On: 06/19/2021 18:21   VAS [place] LOWER EXTREMITY VENOUS (DVT)  Result Date: 06/20/2021  Lower Venous DVT Study Patient Name:  Micheal Donovan  Date of Exam:    06/19/2021 Medical Rec #: [id number]      Accession #:    [id number] Date of Birth: Dec 10, 1950      Patient Gender: M Patient Age:   71 years Exam Location:  [facility] Procedure:      VAS [place] LOWER EXTREMITY VENOUS (DVT) Referring Phys: Jennette Kettle --------------------------------------------------------------------------------  Indications: Pulmonary embolism.  Comparison Study: no prior Performing Technologist: Archie Patten RVS  Examination Guidelines: A complete evaluation includes B-mode imaging, spectral Doppler, color Doppler, and power Doppler as needed of all accessible portions of each vessel. Bilateral testing is considered an integral part of a complete examination. Limited examinations for reoccurring indications may be performed as noted. The reflux portion of the exam is performed with the patient in reverse Trendelenburg.  +---------+---------------+---------+-----------+----------+--------------+ RIGHT    CompressibilityPhasicitySpontaneityPropertiesThrombus Aging +---------+---------------+---------+-----------+----------+--------------+ CFV      Full           Yes      Yes                                 +---------+---------------+---------+-----------+----------+--------------+ SFJ      Full                                                        +---------+---------------+---------+-----------+----------+--------------+ FV Prox  Full                                                        +---------+---------------+---------+-----------+----------+--------------+  FV Mid   Full                                                        +---------+---------------+---------+-----------+----------+--------------+ FV DistalFull                                                        +---------+---------------+---------+-----------+----------+--------------+ PFV      Full                                                         +---------+---------------+---------+-----------+----------+--------------+ POP      Full           Yes      Yes                                 +---------+---------------+---------+-----------+----------+--------------+ PTV      Full                                                        +---------+---------------+---------+-----------+----------+--------------+ PERO     Full                                                        +---------+---------------+---------+-----------+----------+--------------+   +---------+---------------+---------+-----------+----------+--------------+ LEFT     CompressibilityPhasicitySpontaneityPropertiesThrombus Aging +---------+---------------+---------+-----------+----------+--------------+ CFV      Full           Yes      Yes                                 +---------+---------------+---------+-----------+----------+--------------+ SFJ      Full                                                        +---------+---------------+---------+-----------+----------+--------------+ FV Prox  Full                                                        +---------+---------------+---------+-----------+----------+--------------+ FV Mid   Full                                                        +---------+---------------+---------+-----------+----------+--------------+  FV DistalFull                                                        +---------+---------------+---------+-----------+----------+--------------+ PFV      Full                                                        +---------+---------------+---------+-----------+----------+--------------+ POP      Full           Yes      Yes                                 +---------+---------------+---------+-----------+----------+--------------+ PTV      Full                                                         +---------+---------------+---------+-----------+----------+--------------+ PERO     Full                                                        +---------+---------------+---------+-----------+----------+--------------+     Summary: BILATERAL: - No evidence of deep vein thrombosis seen in the lower extremities, bilaterally. -No evidence of popliteal cyst, bilaterally.   *See table(s) above for measurements and observations. Electronically signed by Jamelle Haring on 06/20/2021 at 7:30:40 AM.    Final    Recent Labs    06/20/21 0319 06/21/21 0505  WBC 13.1* 13.3*  HGB 15.4 15.5  HCT 46.2 45.0  PLT 273 284   Recent Labs    06/20/21 0319 06/21/21 0505  NA 127* 133*  K 4.1 3.8  CL 95* 102  CO2 22 23  GLUCOSE 206* 96  BUN 23 14  CREATININE 0.71 0.61  CALCIUM 8.8* 9.2    Intake/Output Summary (Last 24 hours) at 06/21/2021 1252 Last data filed at 06/21/2021 0700 Gross per 24 hour  Intake 357 ml  Output 1300 ml  Net -943 ml     Pressure Injury 06/20/21 Sacrum Mid Stage 1 -  Intact skin with non-blanchable redness of a localized area usually over a bony prominence. A pink blanchabe area (Active)  06/20/21 1300  Location: Sacrum  Location Orientation: Mid  Staging: Stage 1 -  Intact skin with non-blanchable redness of a localized area usually over a bony prominence.  Wound Description (Comments): A pink blanchabe area  Present on Admission: Yes     Pressure Injury 06/20/21 Heel Right Stage 1 -  Intact skin with non-blanchable redness of a localized area usually over a bony prominence. reddened area (Active)  06/20/21 2033  Location: Heel  Location Orientation: Right  Staging: Stage 1 -  Intact skin with non-blanchable redness of a localized area usually over a bony prominence.  Wound Description (Comments): reddened area  Present on  Admission: Yes    Physical Exam: Vital Signs Blood pressure (!) 141/87, pulse 79, temperature 97.7 F (36.5 C), resp. rate 16, height 5\' 7"   (1.702 m), weight 64.9 kg, SpO2 98 %. Gen: no distress, normal appearing HEENT: oral mucosa pink and moist, NCAT Cardio: Reg rate Chest: normal effort, normal rate of breathing Abd: soft, non-distended Ext: no edema Psych: pleasant, normal affect Skin: intact Neurological:     Comments: Patient is alert and makes eye contact with examiner.  Follows simple commands.  Provides name and age.  Fair awareness of deficits. 5/5 strength throughout. Sensation intact throughout.   Assessment/Plan: 1. Functional deficits which require 3+ hours per day of interdisciplinary therapy in a comprehensive inpatient rehab setting. Physiatrist is providing close team supervision and 24 hour management of active medical problems listed below. Physiatrist and rehab team continue to assess barriers to discharge/monitor patient progress toward functional and medical goals  Care Tool:  Bathing    Body parts bathed by patient: Right arm, Left arm, Chest, Abdomen, Right upper leg, Left upper leg, Face   Body parts bathed by helper: Right lower leg, Left lower leg, Buttocks, Front perineal area     Bathing assist Assist Level: Moderate Assistance - Patient 50 - 74%     Upper Body Dressing/Undressing Upper body dressing   What is the patient wearing?: Pull over shirt    Upper body assist Assist Level: Minimal Assistance - Patient > 75%    Lower Body Dressing/Undressing Lower body dressing      What is the patient wearing?: Pants     Lower body assist Assist for lower body dressing: Moderate Assistance - Patient 50 - 74%     Toileting Toileting    Toileting assist Assist for toileting: Moderate Assistance - Patient 50 - 74%     Transfers Chair/bed transfer  Transfers assist  Chair/bed transfer activity did not occur: Refused (dizziness)        Locomotion Ambulation   Ambulation assist   Ambulation activity did not occur: Refused (dizziness)          Walk 10 feet  activity   Assist  Walk 10 feet activity did not occur: Refused (dizziness)        Walk 50 feet activity   Assist Walk 50 feet with 2 turns activity did not occur: Refused (dizziness)         Walk 150 feet activity   Assist Walk 150 feet activity did not occur: Refused (dizziness)         Walk 10 feet on uneven surface  activity   Assist Walk 10 feet on uneven surfaces activity did not occur: Refused (dizziness)         Wheelchair     Assist Is the patient using a wheelchair?: Yes Type of Wheelchair: Manual Wheelchair activity did not occur: Refused (dizziness)         Wheelchair 50 feet with 2 turns activity    Assist    Wheelchair 50 feet with 2 turns activity did not occur: Refused (dizziness)       Wheelchair 150 feet activity     Assist  Wheelchair 150 feet activity did not occur: Refused (dizziness)       Blood pressure (!) 141/87, pulse 79, temperature 97.7 F (36.5 C), resp. rate 16, height 5\' 7"  (1.702 m), weight 64.9 kg, SpO2 98 %.  Medical Problem List and Plan: 1. Functional deficits secondary to IPH involving left cerebellum              -  patient may shower             -ELOS/Goals: 7-8 days S             -Admit to CIR 2.  Antithrombotics: -DVT/anticoagulation: Findings of pulmonary embolism RLL without heart strain.  Patient cleared to begin low-dose intravenous heparin 06/19/2021 x 7 days then consider transition to Eliquis             -antiplatelet therapy: N/A 3. Pain Management: Fioricet as needed headache, tramadol as needed.  Completing Medrol Dosepak for headaches 4. Mood: Provide emotional support             -antipsychotic agents: N/A 5. Neuropsych: This patient is capable of making decisions on his own behalf. 6. Skin/Wound Care: Routine skin checks 7. Fluids/Electrolytes/Nutrition: Routine in and outs with follow-up chemistries 8.  Pulmonary mass with hilar adenopathy.  Status post bronchoscopy 06/17/2021.   Path report pending.  Follow-up outpatient oncology 9.  Uncontrolled diabetes mellitus.  Hemoglobin A1c 13.6.. NovoLog 6 units 3 times daily with meals.  Diabetic teaching. CBGs 97-232: increase Semglee to 29U and d/c ISS.  10.  Hypertension.  Cozaar 50 mg daily, Norvasc 10 mg daily.  Monitor with increased mobility 11.  Hyperlipidemia.  Lipitor 12. Suboptimal vitamin D: start ergocalciferol 50,000 once per week for 7 weeks 13. History of suboptimal magnesium: normalized 14. Tachycardia: start Toprol-XL 12.5mg  daily.  15. Leukocytosis: stable, continue to montior  LOS: 1 days A FACE TO FACE EVALUATION WAS PERFORMED  Clide Deutscher Marcell Pfeifer 06/21/2021, 12:52 PM

## 2021-06-21 NOTE — Progress Notes (Signed)
Inpatient Rehabilitation Care Coordinator ?Assessment and Plan ?Patient Details  ?Name: Micheal Donovan ?MRN: [id number] ?Date of Birth: [birth year] ? ?Today's Date: 06/21/2021 ? ?Hospital Problems: Principal Problem: ?  Intraparenchymal hemorrhage of brain ([place]) ?Active Problems: ?  Pressure injury of skin ? ?Past Medical History:  ?Past Medical History:  ?Diagnosis Date  ? Allergy   ? BPH (benign prostatic hypertrophy)   ? Cancer [facility])   ? Melanoma on Neck    2008  ? Carotid artery occlusion   ? Diabetes mellitus   ? ED (erectile dysfunction)   ? Hyperlipidemia   ? Hypertension   ? Retinopathy due to secondary DM ([place])   ? ?Past Surgical History:  ?Past Surgical History:  ?Procedure Laterality Date  ? BRONCHIAL NEEDLE ASPIRATION BIOPSY  06/17/2021  ? Procedure: BRONCHIAL NEEDLE ASPIRATION BIOPSIES;  Surgeon: Candee Furbish, MD;  Location: [facility] ENDOSCOPY;  Service: Pulmonary;;  ? MELANOMA EXCISION  2008  ? Left side of neck  ? RADIOLOGY WITH ANESTHESIA N/A 04/05/2020  ? Procedure: MRI SPINE WITOUT CONTRAST;  Surgeon: Radiologist, Medication, MD;  Location: [facility];  Service: Radiology;  Laterality: N/A;  ? TONSILLECTOMY    ? VIDEO BRONCHOSCOPY WITH ENDOBRONCHIAL ULTRASOUND N/A 06/17/2021  ? Procedure: VIDEO BRONCHOSCOPY WITH ENDOBRONCHIAL ULTRASOUND;  Surgeon: Candee Furbish, MD;  Location: [facility] ENDOSCOPY;  Service: Pulmonary;  Laterality: N/A;  ? ?Social History:  reports that he quit smoking about 30 years ago. He has never used smokeless tobacco. He reports that he does not drink alcohol and does not use drugs. ? ?Family / Support Systems ?Marital Status: Married ?Patient Roles: Spouse ?Spouse/Significant Other: Clarene Critchley ?Children: [place] Sink (Daughter) ?Anticipated Caregiver: Spouse and daughter ?Ability/Limitations of Caregiver: Daughter able to assist Min G/Min A and spouse able to provide supervision ?Caregiver Availability: 24/7 ?Family Dynamics: Support from daughter and spouse ? ?Social History ?Preferred  language: English ?Religion: Adrian - How often do you need to have someone help you when you read instructions, pamphlets, or other written material from your doctor or pharmacy?: Rarely ?Writes: Yes ?Legal History/Current Legal Issues: n/a ?Guardian/Conservator: n/a  ? ?Abuse/Neglect ?Abuse/Neglect Assessment Can Be Completed: Yes ?Physical Abuse: Denies ?Verbal Abuse: Denies ?Sexual Abuse: Denies ?Exploitation of patient/patient's resources: Denies ?Self-Neglect: Denies ? ?Patient response to: ?Social Isolation - How often do you feel lonely or isolated from those around you?: Never ? ?Emotional Status ?Recent Psychosocial Issues: coping ?Psychiatric History: n/a ?Substance Abuse History: quit smoking 30+ years ago ? ?Patient / Family Perceptions, Expectations & Goals ?Pt/Family understanding of illness & functional limitations: yes ?Premorbid pt/family roles/activities: independent previously using a rolling walker. ?Anticipated changes in roles/activities/participation: Daughter able to assist Min G/MinA and spouse able to provide supervision ?Pt/family expectations/goals: Supervision to MOD I ? ?Community Resources ?Community Agencies: None ?Premorbid Home Care/DME Agencies: Other (Comment) Librarian, academic) ?Transportation available at discharge: daughter able to transport patient ?Is the patient able to respond to transportation needs?: Yes ?In the past 12 months, has lack of transportation kept you from medical appointments or from getting medications?: No ?In the past 12 months, has lack of transportation kept you from meetings, work, or from getting things needed for daily living?: No ? ?Discharge Planning ?Living Arrangements: Spouse/significant other ?Support Systems: Spouse/significant other, Children ?Type of Residence: Private residence ?Insurance Resources: Multimedia programmer (specify) ?Financial Resources: [employer], Social Security ?Financial Screen Referred: No ?Living  Expenses: Lives with family ?Money Management: Patient, Spouse ?Does the patient have any problems obtaining your medications?: No ?  Home Management: Independent ?Patient/Family Preliminary Plans: Daughter able to assist with medications and cognitive tasks, if needed ?Care Coordinator Barriers to Discharge: IV antibiotics, Incontinence, Insurance for SNF coverage, Decreased caregiver support ?Care Coordinator Barriers to Discharge Comments: Spouse currently hospitalized, daughter planning to assist ?Care Coordinator Anticipated Follow Up Needs: HH/OP ?DC Planning Additional Notes/Comments: Spouse currently hospitalized, daughter planning to assist ?Expected length of stay: 7-9 Days ? ?Clinical Impression ?Sw met with patient, introduced self and explained role. Patient plans to discharge home with supervision from spouse and physical assistance from daughter. Patient spouse has been hospitalized recently and awaiting her release. Sw will continue to follow up with questions and concerns. ? ?Dyanne Iha ?06/21/2021, 1:08 PM ? ?  ?

## 2021-06-22 LAB — GLUCOSE, CAPILLARY
Glucose-Capillary: 125 mg/dL — ABNORMAL HIGH (ref 70–99)
Glucose-Capillary: 151 mg/dL — ABNORMAL HIGH (ref 70–99)
Glucose-Capillary: 210 mg/dL — ABNORMAL HIGH (ref 70–99)
Glucose-Capillary: 127 mg/dL — ABNORMAL HIGH (ref 70–99)

## 2021-06-22 LAB — CBC
HCT: 44 % (ref 39.0–52.0)
Hemoglobin: 14.7 g/dL (ref 13.0–17.0)
MCH: 28.9 pg (ref 26.0–34.0)
MCHC: 33.4 g/dL (ref 30.0–36.0)
MCV: 86.6 fL (ref 80.0–100.0)
Platelets: 244 10*3/uL (ref 150–400)
RBC: 5.08 MIL/uL (ref 4.22–5.81)
RDW: 13.1 % (ref 11.5–15.5)
WBC: 13.1 10*3/uL — ABNORMAL HIGH (ref 4.0–10.5)
nRBC: 0.2 % (ref 0.0–0.2)

## 2021-06-22 LAB — HEPARIN LEVEL (UNFRACTIONATED): Heparin Unfractionated: 0.3 IU/mL (ref 0.30–0.70)

## 2021-06-22 MED ORDER — TOPIRAMATE 25 MG PO TABS
25.0000 mg | ORAL_TABLET | Freq: Every day | ORAL | Status: DC
  Administered 2021-06-22 – 2021-06-24 (×3): 25 mg via ORAL
  Filled 2021-06-22 (×3): qty 1

## 2021-06-22 NOTE — Progress Notes (Signed)
?                                                       PROGRESS NOTE ? ? ?Subjective/Complaints: ? ?Pt reports no HA right now, but usually having one- daily.  ?LBM yesterday  ?Keeps eyes closed so doesn't have dizziness, which is also having frequently.  ?Also helps prevent nausea by keeping eyes closed.  ? ? ? ?ROS:  ?Pt denies SOB, abd pain, CP, N/V/C/D, and vision changes ? ?Objective: ?  ?No results found. ? ?Recent Labs  ?  06/21/21 ?0505 06/22/21 ?4193  ?WBC 13.3* 13.1*  ?HGB 15.5 14.7  ?HCT 45.0 44.0  ?PLT 284 244  ? ?Recent Labs  ?  06/20/21 ?7902 06/21/21 ?0505  ?NA 127* 133*  ?K 4.1 3.8  ?CL 95* 102  ?CO2 22 23  ?GLUCOSE 206* 96  ?BUN 23 14  ?CREATININE 0.71 0.61  ?CALCIUM 8.8* 9.2  ? ? ?Intake/Output Summary (Last 24 hours) at 06/22/2021 1609 ?Last data filed at 06/22/2021 1251 ?Gross per 24 hour  ?Intake 960 ml  ?Output 750 ml  ?Net 210 ml  ?  ? ?Pressure Injury 06/20/21 Sacrum Mid Stage 1 -  Intact skin with non-blanchable redness of a localized area usually over a bony prominence. A pink blanchabe area (Active)  ?06/20/21 1300  ?Location: Sacrum  ?Location Orientation: Mid  ?Staging: Stage 1 -  Intact skin with non-blanchable redness of a localized area usually over a bony prominence.  ?Wound Description (Comments): A pink blanchabe area  ?Present on Admission: Yes  ?   ?Pressure Injury 06/20/21 Heel Right Stage 1 -  Intact skin with non-blanchable redness of a localized area usually over a bony prominence. reddened area (Active)  ?06/20/21 2033  ?Location: Heel  ?Location Orientation: Right  ?Staging: Stage 1 -  Intact skin with non-blanchable redness of a localized area usually over a bony prominence.  ?Wound Description (Comments): reddened area  ?Present on Admission: Yes  ? ? ?Physical Exam: ?Vital Signs ?Blood pressure 115/63, pulse 87, temperature (!) 97.4 ?F (36.3 ?C), resp. rate 16, height 5\' 7"  (1.702 m), weight 64.9 kg, SpO2 100 %. ? ? ?General: awake, alert, appropriate, laying supine in  bed; OT in room; eyes closed but talking; NAD ?HENT: keeps eyes closed oropharynx moist ?CV: regular rate; no JVD ?Pulmonary: CTA B/L; no W/R/R- good air movement ?GI: soft, NT, ND, (+)BS ?Psychiatric: appropriate ?Neurological: alert ? ?Skin: intact ?Neurological:  ?   Comments: Patient is alert and makes eye contact with examiner.  Follows simple commands.  Provides name and age.  Fair awareness of deficits. 5/5 strength throughout. Sensation intact throughout. ? ? ?Assessment/Plan: ?1. Functional deficits which require 3+ hours per day of interdisciplinary therapy in a comprehensive inpatient rehab setting. ?Physiatrist is providing close team supervision and 24 hour management of active medical problems listed below. ?Physiatrist and rehab team continue to assess barriers to discharge/monitor patient progress toward functional and medical goals ? ?Care Tool: ? ?Bathing ?   ?Body parts bathed by patient: Right arm, Left arm, Chest, Abdomen, Right upper leg, Left upper leg, Face, Front perineal area, Buttocks  ? Body parts bathed by helper: Right lower leg, Left lower leg, Buttocks, Front perineal area ?Body parts n/a: Right lower leg, Left lower leg ?  ?Bathing assist Assist  Level: Minimal Assistance - Patient > 75% ?  ?  ?Upper Body Dressing/Undressing ?Upper body dressing   ?What is the patient wearing?: Pull over shirt ?   ?Upper body assist Assist Level: Set up assist ?   ?Lower Body Dressing/Undressing ?Lower body dressing ? ? ?   ?What is the patient wearing?: Pants ? ?  ? ?Lower body assist Assist for lower body dressing: Moderate Assistance - Patient 50 - 74% ?   ? ?Toileting ?Toileting    ?Toileting assist Assist for toileting: Minimal Assistance - Patient > 75% ?  ?  ?Transfers ?Chair/bed transfer ? ?Transfers assist ? Chair/bed transfer activity did not occur: Refused (dizziness) ? ?Chair/bed transfer assist level: Minimal Assistance - Patient > 75% (stand pivot) ?  ?   ?Locomotion ?Ambulation ? ? ?Ambulation assist ? ? Ambulation activity did not occur: Refused (dizziness) ? ?Assist level: Minimal Assistance - Patient > 75% ?Assistive device: Walker-rolling ?Max distance: 77ft  ? ?Walk 10 feet activity ? ? ?Assist ? Walk 10 feet activity did not occur: Refused (dizziness) ? ?  ?   ? ?Walk 50 feet activity ? ? ?Assist Walk 50 feet with 2 turns activity did not occur: Refused (dizziness) ? ?  ?   ? ? ?Walk 150 feet activity ? ? ?Assist Walk 150 feet activity did not occur: Refused (dizziness) ? ?  ?  ?  ? ?Walk 10 feet on uneven surface  ?activity ? ? ?Assist Walk 10 feet on uneven surfaces activity did not occur: Refused (dizziness) ? ? ?  ?   ? ?Wheelchair ? ? ? ? ?Assist Is the patient using a wheelchair?: Yes ?Type of Wheelchair: Manual ?Wheelchair activity did not occur: Refused (dizziness) ? ?  ?   ? ? ?Wheelchair 50 feet with 2 turns activity ? ? ? ?Assist ? ?  ?Wheelchair 50 feet with 2 turns activity did not occur: Refused (dizziness) ? ? ?   ? ?Wheelchair 150 feet activity  ? ? ? ?Assist ? Wheelchair 150 feet activity did not occur: Refused (dizziness) ? ? ?   ? ?Blood pressure 115/63, pulse 87, temperature (!) 97.4 ?F (36.3 ?C), resp. rate 16, height 5\' 7"  (1.702 m), weight 64.9 kg, SpO2 100 %. ? ?Medical Problem List and Plan: ?1. 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            -patient may shower ?            -ELOS/Goals: 7-8 days S ?            -Continue CIR- PT, OT and SLP ?2.  Antithrombotics: ?-DVT/anticoagulation: Findings of pulmonary embolism RLL without heart strain.  Patient cleared to begin low-dose intravenous heparin 06/19/2021 x 7 days then consider transition to Eliquis ? 4/22- on Heparin gtt currently ?            -antiplatelet therapy: N/A ?3. Pain Management: Fioricet as needed headache, tramadol as needed.  Completing Medrol Dosepak for headaches ? 4/22- will try Topamax to help with HA prevention.  ?4. Mood: Provide  emotional support ?            -antipsychotic agents: N/A ?5. Neuropsych: This patient is capable of making decisions on his own behalf. ?6. Skin/Wound Care: Routine skin checks ?7. Fluids/Electrolytes/Nutrition: Routine in and outs with follow-up chemistries ?8.  Pulmonary mass with hilar adenopathy.  Status post bronchoscopy 06/17/2021.  Path report pending.  Follow-up outpatient oncology ?9.  Uncontrolled  diabetes mellitus.  Hemoglobin A1c 13.6.. ?NovoLog 6 units 3 times daily with meals.  Diabetic teaching. CBGs 97-232: increase Semglee to 29U and d/c ISS.  ? 4/22- CBGs 120s-150s- con't regimen ?10.  Hypertension.  Cozaar 50 mg daily, Norvasc 10 mg daily.  Monitor with increased mobility ? 4/22- BP controlled- con't regimen ?11.  Hyperlipidemia.  Lipitor ?12. Suboptimal vitamin D: start ergocalciferol 50,000 once per week for 7 weeks ?13. History of suboptimal magnesium: normalized ?14. Tachycardia: start Toprol-XL 12.5mg  daily.  ?15. Leukocytosis: stable, continue to montior ? 4/22- likely due to steroids-  ? ?I spent a total of  37  minutes on total care today- >50% coordination of care- due to d/w pt and review of chart- HA prevention meds.  ? ? ? ?LOS: ?2 days ?A FACE TO FACE EVALUATION WAS PERFORMED ? ?Micheal Donovan ?06/22/2021, 4:09 PM  ? ?  ?

## 2021-06-22 NOTE — Progress Notes (Signed)
Occupational Therapy Session Note ? ?Patient Details  ?Name: Micheal Donovan ?MRN: [id number] ?Date of Birth: 25-Nov-1950 ? ?Today's Date: 06/22/2021 ?OT Individual Time: [phone] ?OT Individual Time Calculation (min): 59 min  ? ? ?Short Term Goals: ?Week 1:  OT Short Term Goal 1 (Week 1): STG = LTG 2/2 ELOS ? ?Skilled Therapeutic Interventions/Progress Updates:  ?  Session 1: [phone])  Pt in bed to start with no report of dizziness at rest.  With rolling to the right side he reports dizziness at 5/10 and then increasing to 6/10 with transition to sitting.  Supervision needed for transition with independent sitting balance.  He engaged in bathing and dressing tasks from the EOB.  Dizziness maintained at 6-7/10 with completion of tasks in sitting.  Increased dizziness noted with bending down to wash his LEs and with standing, up to an 8-9/10.  He was able to donn his scrub pants at mod assist level sit to stand with use of the RW for support and supervision for pullover top.  He transitioned back to supine at completion of tasks to rest.  Noted pt closes his eyes a lot to help relieve dizziness but it still will be a 4/10.  Call button and phone in reach with safety alarm belt in place.   ? ?Session 2: ([id number])  Pt initially with increased headache on first attempt requesting therapist to come back.  Nursing made aware and meds brought.  Therapist checked back later and pt felt better and agreed to participate in session.  Pt reported dizziness at 4/10 in supine with HOB elevated.  With transfer to the EOB it increased slightly to 5/10.  Min assist for transfer to the wheelchair stand pivot without an assistive device.  He reported dizziness still at 4-5/10.  No increase in dizziness with therapist rolling him to the dayroom where he transferred to the EOM at the same previous level.  Therapist completed more vestibular testing with pt in sitting.  Visual tracking exhibited jerky movements in all direction with end  range gaze holding horizontal nystagmus noted in both directions.  No increase in dizziness with tracking.  Convergence was WFLs as well with saccadic movements when head was stationary.  VOR was intact as well with no increase in baseline dizziness of 2/10 while sitting.  VOR cancellation was positive with jerky movements noted when trying to follow target with head and eyes moving together, but he was able to stay on it.  No significant increase in dizziness noted with this or with head movements left to right rapidly.  Pt did report pain above the right eye with head turn and gaze to the left however. The biggest increase in dizziness was noted with positional changes.  When bending forward to pull up his socks, he reported dizziness increasing to a 7/10.  With transitions from sitting to sidelying he did not report dizziness but this was present with transitions to sitting from sidelying at 5/10 when moving from left side to sitting and 6/10 from right side to sitting.  Transitioning from left sidelying to supine did not provoke dizziness.  Dizziness did increase to a 4/10 with rolling from his left side to his right and vice versa.  Target pointing with his vision while moving did not decrease dizziness with any positional change.  He completed standing and short distance ambulation less than 8' to the wheelchair at min assist level and no device without report of dizziness this session.  Returned to the room  where he transferred squat pivot to the bed and into supine rapidly, reporting increased dizziness at 4/10 with this maneuver.  Cueing to slowly transition to different positions to help decrease dizziness.  Based on testing feel pt is positive for central vestibular dysfunction but seems to be mostly impacted with positional changes.  Recommend habituation treatment including rolling in bed from side to side, transitions from sidelying to sit rapidly, and transitions for bending forward to reach down and  back from seated position as these were the ones that caused the most increased dizziness.   This could be progressed in standing as well for reaching down to pick objects or incorporating head movements as appropriate with mobility as he progresses.  Continue to monitor dizziness with all movements to look for progress using the 1-10 point scale.   ? ?Therapy Documentation ?Precautions:  ?Precautions ?Precautions: Fall ?Precaution Comments: dizziness ?Restrictions ?Weight Bearing Restrictions: No ? ?Pain: ?Pain Assessment ?Pain Scale: 0-10 ?Pain Score: 0-No pain ?ADL: ?See Care Tool Section for some details of mobility and selfcare ?Other Treatments:   ? ? ?Therapy/Group: Individual Therapy ? ?Micheal Donovan OTR/L ?06/22/2021, 12:21 PM ?

## 2021-06-22 NOTE — Progress Notes (Signed)
Physical Therapy Session Note ? ?Patient Details  ?Name: Micheal Donovan ?MRN: [id number] ?Date of Birth: [birth year] ? ?Today's Date: 06/22/2021 ?PT Individual Time: [id number] ?PT Individual Time Calculation (min): 55 min  ? ?Short Term Goals: ?Week 1:  PT Short Term Goal 1 (Week 1): STG=LTG due to LOS ? ?Skilled Therapeutic Interventions/Progress Updates:  ?  Pt received supine in bed, resting with his brother present. Upon awakening, pt agreeable to therapy session stating "I want to get better so I can go home." Therapist scheduled to perform a vestibular evaluation during this session. Supine>sitting R EOB, HOB flat but using bedrail, with supervision and increased time. Pt reports "drunk" dizziness feeling increases to 6/10 upon sitting upright. Pt noted to keep his eyes closed while sitting EOB and he says this helps to decrease the amount of stimulus to decrease his "drunk" feeling. Pt noted to have guarding in his neck and spontaneously closes his eyes and gently stretches his neck in small circles - pt states this helps relax his neck muscles. ? ?Pt's subjective report and medical diagnosis indicate that the "drunk" dizziness is due to central origin and primary focus of vestibular assessment and intervention would be visual exercises targeting habituation. ? ?Sitting EOB, assessed smooth pursuits with pt noted to have oculomotor ROM WNL in all directions bilaterally but noticed decreased smoothness of movements with slight overshooting/undershooting ataxic type movements. Pt reports this assessment increases his "drunk" dizziness feeling. ? ?Therapist offered to either perform vestibular assessment and intervention during this session or focus on mobility and have OT perform vestibular assessment during next session - pt elects to focus on his gait with this PT. ? ?Sit<>stands with light min assist during session. L stand pivot transfer EOB>w/c, no AD but using B UE support on bedrail and w/c armrest, with  light min assist for balance. ? ? Transported to/from gym in w/c for time management and energy conservation. ? ?Gait training 61ft x4 using RW with seated rest breaks in a supported chair between to allow dizziness symptoms to decrease - light min assist for balance throughout - slow gait speed though achieving reciprocal stepping pattern, pt keeps gaze down at the floor to decrease his dizziness symptoms - no significant LE or truncal ataxia noted at this time, but anticipate pt compensating well using AD. Thearpist managing IV line throughout otherwise would have progressed to gait training without AD to challenge pt's balance. ? ?Therapist provides pt with w/c cushion to promote increased, upright OOB activity and educated pt on importance of being OOB. ? ?Pt emotional and tearful during session stating his wife is also in the hospital at this time and he is concerned about her. Pt also confides that he has been diagnosed with cancer since his admissions and is concerned about this. Therapist provides emotional support to patient. ? ?At end of session, pt left seated in w/c with needs in reach, lines intact, seat belt alarm on, and his brother present. ? ? ?Therapy Documentation ?Precautions:  ?Precautions ?Precautions: Fall ?Precaution Comments: dizziness ?Restrictions ?Weight Bearing Restrictions: No ? ? ?Pain: ?No reports of pain throughout session. ? ? ? ?Therapy/Group: Individual Therapy ? ?Tawana Scale , PT, DPT, NCS, CSRS ?06/22/2021, 7:59 AM  ?

## 2021-06-22 NOTE — Progress Notes (Signed)
ANTICOAGULATION CONSULT NOTE - Follow Up Consult ? ?Pharmacy Consult for Heparin ?Indication: pulmonary embolus (s/p CVA with hemorrhagic conversion) ? ?Allergies  ?Allergen Reactions  ? Niaspan [Niacin]   ?  FLUSHING  ? ? ?Patient Measurements: ?Height: 5\' 7"  (170.2 cm) ?Weight: 64.9 kg (143 lb 1.3 oz) ?IBW/kg (Calculated) : 66.1 ?Heparin Dosing Weight: 64.9 kg ? ?Vital Signs: ?Temp: 98.4 ?F (36.9 ?C) (04/22 0314) ?Temp Source: Oral (04/22 0314) ?BP: 143/76 (04/22 0314) ?Pulse Rate: 73 (04/22 0314) ? ?Labs: ?Recent Labs  ?  06/20/21 ?7903 06/20/21 ?1154 06/21/21 ?0505 06/21/21 ?1100 06/21/21 ?1856 06/22/21 ?8333  ?HGB 15.4  --  15.5  --   --  14.7  ?HCT 46.2  --  45.0  --   --  44.0  ?PLT 273  --  284  --   --  244  ?HEPARINUNFRC <0.10*   < > 0.31 0.28* 0.35 0.30  ?CREATININE 0.71  --  0.61  --   --   --   ? < > = values in this interval not displayed.  ? ? ?Estimated Creatinine Clearance: 78.9 mL/min (by C-G formula based on SCr of 0.61 mg/dL). ? ? ?Medications:  ?Scheduled:  ? amLODipine  10 mg Oral Daily  ? atorvastatin  40 mg Oral Daily  ? fluticasone  1 spray Each Nare BID  ? guaiFENesin  1,200 mg Oral BID  ? insulin aspart  6 Units Subcutaneous TID WC  ? insulin glargine-yfgn  29 Units Subcutaneous Daily  ? loratadine  10 mg Oral Daily  ? losartan  50 mg Oral Daily  ? metoprolol succinate  12.5 mg Oral Daily  ? mometasone-formoterol  2 puff Inhalation BID  ? pantoprazole  40 mg Oral Q0600  ? senna-docusate  1 tablet Oral BID  ? sodium chloride  1 spray Each Nare BID  ? Vitamin D (Ergocalciferol)  50,000 Units Oral Q7 days  ? ?Infusions:  ? heparin 1,200 Units/hr (06/21/21 2212)  ? ? ?Assessment: ?71 yo M who presented for worsening headaches found to have large IPH involving the left cerebellum infarct likely secondary ischemic. Now found to have right lower lobe PE. OK for heparin per neurology, low-dose stroke protocol. ? ?Heparin level today is therapeutic at 0.30, on 1200 units/hr. Hgb 14.7, plt 244 -  stable. No line issues or signs/symptoms of bleeding noted per RN. Will increase heparin infusion rate slightly to keep heparin level within therapeutic range (0.3-0.5 units/mL). ? ?Goal of Therapy:  ?Heparin level 0.3-0.5 units/ml ?Monitor platelets by anticoagulation protocol: Yes ?  ?Plan:  ?Increase heparin to 1250 units/hr. ?Daily CBC, heparin level. ?Monitor for signs/symptoms of bleeding. ? ? ?Vance Peper, PharmD ?PGY1 Pharmacy Resident ?Phone [phone] ?06/22/2021 9:10 AM  ? ?Please check AMION for all [place] phone numbers ?After 10:00 PM, call [place] [phone] ?

## 2021-06-23 LAB — GLUCOSE, CAPILLARY
Glucose-Capillary: 179 mg/dL — ABNORMAL HIGH (ref 70–99)
Glucose-Capillary: 77 mg/dL (ref 70–99)
Glucose-Capillary: 256 mg/dL — ABNORMAL HIGH (ref 70–99)
Glucose-Capillary: 226 mg/dL — ABNORMAL HIGH (ref 70–99)

## 2021-06-23 LAB — CBC
HCT: 43.3 % (ref 39.0–52.0)
Hemoglobin: 14.6 g/dL (ref 13.0–17.0)
MCH: 28.6 pg (ref 26.0–34.0)
MCHC: 33.7 g/dL (ref 30.0–36.0)
MCV: 84.9 fL (ref 80.0–100.0)
Platelets: 279 10*3/uL (ref 150–400)
RBC: 5.1 MIL/uL (ref 4.22–5.81)
RDW: 13 % (ref 11.5–15.5)
WBC: 10.3 10*3/uL (ref 4.0–10.5)
nRBC: 0 % (ref 0.0–0.2)

## 2021-06-23 LAB — HEPARIN LEVEL (UNFRACTIONATED): Heparin Unfractionated: 0.41 IU/mL (ref 0.30–0.70)

## 2021-06-23 NOTE — Progress Notes (Signed)
Physical Therapy Session Note ? ?Patient Details  ?Name: Micheal Donovan ?MRN: [id number] ?Date of Birth: September 13, 1950 ? ?Today's Date: 06/23/2021 ?PT Individual Time: 1000-1100 ?PT Individual Time Calculation (min): 60 min  ? ?Short Term Goals: ?Week 1:  PT Short Term Goal 1 (Week 1): STG=LTG due to LOS ? ?Skilled Therapeutic Interventions/Progress Updates:  ?   ?Patient in bed upon PT arrival. Patient alert and agreeable to PT session. Patient denied pain during session. Patient without reports of dizziness at beginning of session. ? ?Orthostatic Vitals: ?Supine: BP 129/74, HR 73, dizziness 0/10 ?Sitting: BP 123/72, HR 82, dizziness 9/10 reduced to 4/10 <2 min ?Standing: BP 111/94, HR 73, dizziness 6/10 progressed to 7/10 ?Standing x3 min: BP 137/74, HR 91, dizziness 7/10 ? ?Focused session on patient education on central dizziness, interventions, and management. Assessed orthostatic vitals to rule out orthostasis with postural changes. Patient emotional and tearful intermittently during session in relation to his loss of functional independence and his wife's current decline during her hospital stay. NT agreeable to take the patient to see his wife in the ICU later today. Provided therapeutic listening and coping strategies throughout session.  ? ?Therapeutic Activity: ?Bed Mobility: Patient performed supine to sit with supervision-mod I in a flat bed without use of bed rails and increased time.  ?Transfers: Patient performed sit to/from stand x3 with close supervision and use of RW. Provided verbal cues for hand placement, forward weight shift, and reaching back to sit with poor carry-over each trial. ? ?Gait Training:  ?Patient ambulated 90 feet and 100 feet using RW with CGA and w/c follow due to decreased activity tolerance. Ambulated with decreased gait speed, decreased step length and height, narrow BOS, forward trunk lean, and downward head gaze. Provided verbal cues for erect posture, looking ahead as tolerated,  and safe proximity to RW. Adjusted RW height prior to second trial for improved positioning and safety. Dizziness remained between 6-7/10 during gait training, improved to 2-3/10 in sitting <2 min.  ? ?Patient sitting EOB handed off to NT at end of session with breaks locked, bed alarm set, and all needs within reach.  ? ?Therapy Documentation ?Precautions:  ?Precautions ?Precautions: Fall ?Precaution Comments: dizziness ?Restrictions ?Weight Bearing Restrictions: No ? ? ? ?Therapy/Group: Individual Therapy ? ?Doreene Burke PT, DPT ? ?06/23/2021, 7:06 PM  ?

## 2021-06-23 NOTE — Progress Notes (Signed)
?                                                       PROGRESS NOTE ? ? ?Subjective/Complaints: ? ?Pt reports Fioricet helped HA earlier, but back to causing more pain/the HA is back.  ?Not light/sound sensitive.  ?Nausea is a little better and dizziness is bad- esp when opens his eyes.  ?LBM yesterday  ? ?ROS:  ? ?Pt denies SOB, abd pain, CP, N/V/C/D, and vision changes ? ?Objective: ?  ?No results found. ? ?Recent Labs  ?  06/22/21 ?8676 06/23/21 ?1950  ?WBC 13.1* 10.3  ?HGB 14.7 14.6  ?HCT 44.0 43.3  ?PLT 244 279  ? ?Recent Labs  ?  06/21/21 ?0505  ?NA 133*  ?K 3.8  ?CL 102  ?CO2 23  ?GLUCOSE 96  ?BUN 14  ?CREATININE 0.61  ?CALCIUM 9.2  ? ? ?Intake/Output Summary (Last 24 hours) at 06/23/2021 1448 ?Last data filed at 06/23/2021 1250 ?Gross per 24 hour  ?Intake 1970.51 ml  ?Output 1700 ml  ?Net 270.51 ml  ?  ? ?Pressure Injury 06/20/21 Sacrum Mid Stage 1 -  Intact skin with non-blanchable redness of a localized area usually over a bony prominence. A pink blanchabe area (Active)  ?06/20/21 1300  ?Location: Sacrum  ?Location Orientation: Mid  ?Staging: Stage 1 -  Intact skin with non-blanchable redness of a localized area usually over a bony prominence.  ?Wound Description (Comments): A pink blanchabe area  ?Present on Admission: Yes  ?   ?Pressure Injury 06/20/21 Heel Right Stage 1 -  Intact skin with non-blanchable redness of a localized area usually over a bony prominence. reddened area (Active)  ?06/20/21 2033  ?Location: Heel  ?Location Orientation: Right  ?Staging: Stage 1 -  Intact skin with non-blanchable redness of a localized area usually over a bony prominence.  ?Wound Description (Comments): reddened area  ?Present on Admission: Yes  ? ? ?Physical Exam: ?Vital Signs ?Blood pressure 123/67, pulse 69, temperature 98 ?F (36.7 ?C), temperature source Oral, resp. rate 15, height 5\' 7"  (1.702 m), weight 64.9 kg, SpO2 98 %. ? ? ? ?General: awake, alert, appropriate, laying supine in bed; NAD ?HENT: eyes closed  to prevent HA/dizziness ?CV: regular rate; no JVD ?Pulmonary: CTA B/L; no W/R/R- good air movement ?GI: soft, NT, ND, (+)BS ?Psychiatric: appropriate ?Neurological: alert ?Skin: intact ?Neurological:  ?   Comments: Patient is alert and makes eye contact with examiner.  Follows simple commands.  Provides name and age.  Fair awareness of deficits. 5/5 strength throughout. Sensation intact throughout. ? ? ?Assessment/Plan: ?1. Functional deficits which require 3+ hours per day of interdisciplinary therapy in a comprehensive inpatient rehab setting. ?Physiatrist is providing close team supervision and 24 hour management of active medical problems listed below. ?Physiatrist and rehab team continue to assess barriers to discharge/monitor patient progress toward functional and medical goals ? ?Care Tool: ? ?Bathing ?   ?Body parts bathed by patient: Right arm, Left arm, Chest, Abdomen, Right upper leg, Left upper leg, Face, Front perineal area, Buttocks  ? Body parts bathed by helper: Right lower leg, Left lower leg, Buttocks, Front perineal area ?Body parts n/a: Right lower leg, Left lower leg ?  ?Bathing assist Assist Level: Minimal Assistance - Patient > 75% ?  ?  ?Upper Body Dressing/Undressing ?Upper  body dressing   ?What is the patient wearing?: Pull over shirt ?   ?Upper body assist Assist Level: Set up assist ?   ?Lower Body Dressing/Undressing ?Lower body dressing ? ? ?   ?What is the patient wearing?: Pants ? ?  ? ?Lower body assist Assist for lower body dressing: Moderate Assistance - Patient 50 - 74% ?   ? ?Toileting ?Toileting    ?Toileting assist Assist for toileting: Moderate Assistance - Patient 50 - 74% ?  ?  ?Transfers ?Chair/bed transfer ? ?Transfers assist ? Chair/bed transfer activity did not occur: Refused (dizziness) ? ?Chair/bed transfer assist level: Minimal Assistance - Patient > 75% ?  ?  ?Locomotion ?Ambulation ? ? ?Ambulation assist ? ? Ambulation activity did not occur: Refused  (dizziness) ? ?Assist level: Minimal Assistance - Patient > 75% ?Assistive device: Walker-rolling ?Max distance: 28ft  ? ?Walk 10 feet activity ? ? ?Assist ? Walk 10 feet activity did not occur: Refused (dizziness) ? ?  ?   ? ?Walk 50 feet activity ? ? ?Assist Walk 50 feet with 2 turns activity did not occur: Refused (dizziness) ? ?  ?   ? ? ?Walk 150 feet activity ? ? ?Assist Walk 150 feet activity did not occur: Refused (dizziness) ? ?  ?  ?  ? ?Walk 10 feet on uneven surface  ?activity ? ? ?Assist Walk 10 feet on uneven surfaces activity did not occur: Refused (dizziness) ? ? ?  ?   ? ?Wheelchair ? ? ? ? ?Assist Is the patient using a wheelchair?: Yes ?Type of Wheelchair: Manual ?Wheelchair activity did not occur: Refused (dizziness) ? ?  ?   ? ? ?Wheelchair 50 feet with 2 turns activity ? ? ? ?Assist ? ?  ?Wheelchair 50 feet with 2 turns activity did not occur: Refused (dizziness) ? ? ?   ? ?Wheelchair 150 feet activity  ? ? ? ?Assist ? Wheelchair 150 feet activity did not occur: Refused (dizziness) ? ? ?   ? ?Blood pressure 123/67, pulse 69, temperature 98 ?F (36.7 ?C), temperature source Oral, resp. rate 15, height 5\' 7"  (1.702 m), weight 64.9 kg, SpO2 98 %. ? ?Medical Problem List and Plan: ?1. 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            -patient may shower ?            -ELOS/Goals: 7-8 days S ?            Continue CIR- PT, OT and SLP ?2.  Antithrombotics: ?-DVT/anticoagulation: Findings of pulmonary embolism RLL without heart strain.  Patient cleared to begin low-dose intravenous heparin 06/19/2021 x 7 days then consider transition to Eliquis ? 4/22- on Heparin gtt currently ?            -antiplatelet therapy: N/A ?3. Pain Management: Fioricet as needed headache, tramadol as needed.  Completing Medrol Dosepak for headaches ? 4/22- will try Topamax to help with HA prevention.  ? 4/23- started topamax 25 mg QHS last night- suggest titrating up slowly- however still having HA's  so far- con't fioricet prn and tramadol ?4. Mood: Provide emotional support ?            -antipsychotic agents: N/A ?5. Neuropsych: This patient is capable of making decisions on his own behalf. ?6. Skin/Wound Care: Routine skin checks ?7. Fluids/Electrolytes/Nutrition: Routine in and outs with follow-up chemistries ?8.  Pulmonary mass with hilar adenopathy.  Status post bronchoscopy 06/17/2021.  Path report pending.  Follow-up outpatient oncology ?9.  Uncontrolled diabetes mellitus.  Hemoglobin A1c 13.6.. ?NovoLog 6 units 3 times daily with meals.  Diabetic teaching. CBGs 97-232: increase Semglee to 29U and d/c ISS.  ? 4/23- CBGs 77 to 179- con't regimen ?10.  Hypertension.  Cozaar 50 mg daily, Norvasc 10 mg daily.  Monitor with increased mobility ? 4/22- BP controlled- con't regimen ?11.  Hyperlipidemia.  Lipitor ?12. Suboptimal vitamin D: start ergocalciferol 50,000 once per week for 7 weeks ?13. History of suboptimal magnesium: normalized ?14. Tachycardia: start Toprol-XL 12.5mg  daily.  ?15. Leukocytosis: stable, continue to montior ? 4/22- likely due to steroids-  ? 4/23- labs today- WBC is normalized- con't to monitor ? ? ?I spent a total of 36   minutes on total care today- >50% coordination of care- due to IPOC and d/w nursing about HA-  ? ? ? ?LOS: ?3 days ?A FACE TO FACE EVALUATION WAS PERFORMED ? ?Vannie Hilgert ?06/23/2021, 2:48 PM  ? ?  ?

## 2021-06-23 NOTE — IPOC Note (Signed)
Overall Plan of Care (IPOC) ?Patient Details ?Name: Micheal Donovan ?MRN: [id number] ?DOB: 02/15/1951 ? ?Admitting Diagnosis: Intraparenchymal hemorrhage of brain ([facility]) ? ?Hospital Problems: Principal Problem: ?  Intraparenchymal hemorrhage of brain ([place]) ?Active Problems: ?  Pressure injury of skin ? ? ? ? Functional Problem List: ?Nursing Edema, Endurance, Medication Management, Pain, Safety  ?PT Balance, Endurance, Motor, Pain  ?OT Balance, Endurance, Motor, Pain, Safety, Vision  ?SLP    ?TR    ?    ? Basic ADL?s: ?OT Grooming, Bathing, Dressing, Toileting  ? ?  Advanced  ADL?s: ?OT    ?   ?Transfers: ?PT Bed Mobility, Bed to Chair, Car, Furniture  ?OT Toilet, Tub/Shower  ? ?  Locomotion: ?PT Ambulation, Wheelchair Mobility, Stairs  ? ?  Additional Impairments: ?OT None  ?SLP   ?  ?   ?TR    ? ? ?Anticipated Outcomes ?Item Anticipated Outcome  ?Self Feeding Indep  ?Swallowing ?   ?  ?Basic self-care ? Mod I  ?Toileting ? Mod I ?  ?Bathroom Transfers Mod I-supervision  ?Bowel/Bladder ? n/a  ?Transfers ? Mod I with LRAD  ?Locomotion ? Mod I with LRAD  ?Communication ?    ?Cognition ?    ?Pain ? < 3  ?Safety/Judgment ? supervision  ? ?Therapy Plan: ?PT Intensity: Minimum of 1-2 x/day ,45 to 90 minutes ?PT Frequency: 5 out of 7 days ?PT Duration Estimated Length of Stay: 7-10 days ?OT Intensity: Minimum of 1-2 x/day, 45 to 90 minutes ?OT Frequency: 5 out of 7 days ?OT Duration/Estimated Length of Stay: 7-10 days ?   ? ?Due to the current state of emergency, patients may not be receiving their 3-hours of Medicare-mandated therapy. ? ? Team Interventions: ?Nursing Interventions Patient/Family Education, Disease Management/Prevention, Pain Management, Medication Management, Discharge Planning  ?PT interventions Ambulation/gait training, Discharge planning, Functional mobility training, Therapeutic Activities, Visual/perceptual remediation/compensation, Balance/vestibular training, Disease management/prevention,  Neuromuscular re-education, Skin care/wound management, Therapeutic Exercise, Wheelchair propulsion/positioning, Cognitive remediation/compensation, DME/adaptive equipment instruction, Pain management, UE/LE Strength taining/ROM, Splinting/orthotics, Community reintegration, Barrister's clerk education, Stair training, UE/LE Coordination activities, Psychosocial support  ?OT Interventions Balance/vestibular training, Discharge planning, Pain management, Self Care/advanced ADL retraining, Therapeutic Activities, UE/LE Coordination activities, Functional mobility training, Patient/family education, Therapeutic Exercise, Visual/perceptual remediation/compensation, DME/adaptive equipment instruction, UE/LE Strength taining/ROM  ?SLP Interventions    ?TR Interventions    ?SW/CM Interventions Discharge Planning, Psychosocial Support, Patient/Family Education, Disease Management/Prevention  ? ?Barriers to Discharge ?MD  Medical stability, Home enviroment access/loayout, Neurogenic bowel and bladder, Lack of/limited family support, and Behavior  ?Nursing Decreased caregiver support, Home environment access/layout, Lack of/limited family support ?1 level, 1-3 steps (depends on entrance), no rails. Spouse can provide supervision/min guard for mobility, min assist with ADL's.  ?PT Inaccessible home environment, Home environment access/layout, Other (comments) ?headache, dizziness, steps to enter home  ?OT Home environment access/layout ?   ?SLP   ?   ?SW IV antibiotics, Incontinence, Insurance for SNF coverage, Decreased caregiver support ?Spouse currently hospitalized, daughter planning to assist  ? ?Team Discharge Planning: ?Destination: PT-Home ,OT- Home , SLP-  ?Projected Follow-up: PT-Home health PT, OT-  Home health OT, SLP-  ?Projected Equipment Needs: PT-To be determined, OT- To be determined, SLP-  ?Equipment Details: PT-has RW, OT-  ?Patient/family involved in discharge planning: PT- Patient,  OT-Patient, SLP-  ? ?MD  ELOS: 7-10 days ?Medical Rehab Prognosis:  Good ?Assessment: The patient has been admitted for CIR therapies with the diagnosis of cerebellar hemorrhage. The team will be  addressing functional mobility, strength, stamina, balance, safety, adaptive techniques and equipment, self-care, bowel and bladder mgt, patient and caregiver education, treat dizziness/nausea. Goals have been set at mod I to supervision. Anticipated discharge destination is home. ? ?Due to the current state of emergency, patients may not be receiving their 3 hours per day of Medicare-mandated therapy.  ? ?HA's and dizziness main limiters ? ? ?See Team Conference Notes for weekly updates to the plan of care ? ?

## 2021-06-23 NOTE — Progress Notes (Signed)
ANTICOAGULATION CONSULT NOTE - Follow Up Consult ? ?Pharmacy Consult for Heparin ?Indication: pulmonary embolus (s/p CVA with hemorrhagic conversion) ? ?Allergies  ?Allergen Reactions  ? Niaspan [Niacin]   ?  FLUSHING  ? ? ?Patient Measurements: ?Height: 5\' 7"  (170.2 cm) ?Weight: 64.9 kg (143 lb 1.3 oz) ?IBW/kg (Calculated) : 66.1 ?Heparin Dosing Weight: 64.9 kg ? ?Vital Signs: ?Temp: 98.3 ?F (36.8 ?C) (04/23 0409) ?Temp Source: Oral (04/23 0409) ?BP: 113/65 (04/23 0409) ?Pulse Rate: 71 (04/23 0409) ? ?Labs: ?Recent Labs  ?  06/21/21 ?0505 06/21/21 ?1100 06/21/21 ?1856 06/22/21 ?7544 06/23/21 ?9201  ?HGB 15.5  --   --  14.7 14.6  ?HCT 45.0  --   --  44.0 43.3  ?PLT 284  --   --  244 279  ?HEPARINUNFRC 0.31   < > 0.35 0.30 0.41  ?CREATININE 0.61  --   --   --   --   ? < > = values in this interval not displayed.  ? ? ?Estimated Creatinine Clearance: 78.9 mL/min (by C-G formula based on SCr of 0.61 mg/dL). ? ? ?Medications:  ?Scheduled:  ? amLODipine  10 mg Oral Daily  ? atorvastatin  40 mg Oral Daily  ? fluticasone  1 spray Each Nare BID  ? guaiFENesin  1,200 mg Oral BID  ? insulin aspart  6 Units Subcutaneous TID WC  ? insulin glargine-yfgn  29 Units Subcutaneous Daily  ? loratadine  10 mg Oral Daily  ? losartan  50 mg Oral Daily  ? metoprolol succinate  12.5 mg Oral Daily  ? mometasone-formoterol  2 puff Inhalation BID  ? pantoprazole  40 mg Oral Q0600  ? senna-docusate  1 tablet Oral BID  ? sodium chloride  1 spray Each Nare BID  ? topiramate  25 mg Oral QHS  ? Vitamin D (Ergocalciferol)  50,000 Units Oral Q7 days  ? ?Infusions:  ? heparin 1,250 Units/hr (06/23/21 0071)  ? ? ?Assessment: ?71 yo M who presented for worsening headaches found to have large IPH involving the left cerebellum infarct likely secondary ischemic. Now found to have right lower lobe PE. OK for heparin per neurology, low-dose stroke protocol. ? ?Heparin level today is therapeutic at 0.41, on 1250 units/hr. Hgb 14.6, plt 279 - stable. No line  issues or signs/symptoms of bleeding noted per RN.  ? ?Goal of Therapy:  ?Heparin level 0.3-0.5 units/ml ?Monitor platelets by anticoagulation protocol: Yes ?  ?Plan:  ?Continue heparin at 1250 units/hr. ?Daily CBC, heparin level. ?Monitor for signs/symptoms of bleeding. ? ? ?Vance Peper, PharmD ?PGY1 Pharmacy Resident ?Phone [phone] ?06/23/2021 7:51 AM  ? ?Please check AMION for all Loop phone numbers ?After 10:00 PM, call [place] [phone] ?

## 2021-06-23 NOTE — Progress Notes (Signed)
Restless first part of shift, without specific complaint of. Refused scheduled senna, ocean nasal spray and SCD's. Reports SCD's "keep me awake at night."  PRN fioricet given at 0109 for complaint of HA. Heparin infusing at 12.50ml.  Spilled urinal vs incontinent, requiring complete bed change. One episode of "my head is spinning." Micheal Donovan A  ?

## 2021-06-24 LAB — HEPARIN LEVEL (UNFRACTIONATED): Heparin Unfractionated: 0.34 IU/mL (ref 0.30–0.70)

## 2021-06-24 LAB — GLUCOSE, CAPILLARY
Glucose-Capillary: 126 mg/dL — ABNORMAL HIGH (ref 70–99)
Glucose-Capillary: 162 mg/dL — ABNORMAL HIGH (ref 70–99)
Glucose-Capillary: 227 mg/dL — ABNORMAL HIGH (ref 70–99)
Glucose-Capillary: 429 mg/dL — ABNORMAL HIGH (ref 70–99)
Glucose-Capillary: 308 mg/dL — ABNORMAL HIGH (ref 70–99)

## 2021-06-24 LAB — CBC
HCT: 42.4 % (ref 39.0–52.0)
Hemoglobin: 14.2 g/dL (ref 13.0–17.0)
MCH: 28.9 pg (ref 26.0–34.0)
MCHC: 33.5 g/dL (ref 30.0–36.0)
MCV: 86.2 fL (ref 80.0–100.0)
Platelets: 261 10*3/uL (ref 150–400)
RBC: 4.92 MIL/uL (ref 4.22–5.81)
RDW: 13 % (ref 11.5–15.5)
WBC: 11.2 10*3/uL — ABNORMAL HIGH (ref 4.0–10.5)
nRBC: 0 % (ref 0.0–0.2)

## 2021-06-24 MED ORDER — INSULIN ASPART 100 UNIT/ML IJ SOLN
6.0000 [IU] | INTRAMUSCULAR | Status: AC
  Administered 2021-06-24: 6 [IU] via SUBCUTANEOUS

## 2021-06-24 MED ORDER — INSULIN GLARGINE-YFGN 100 UNIT/ML ~~LOC~~ SOLN
30.0000 [IU] | Freq: Every day | SUBCUTANEOUS | Status: DC
  Administered 2021-06-25: 30 [IU] via SUBCUTANEOUS
  Filled 2021-06-24: qty 0.3

## 2021-06-24 NOTE — Progress Notes (Signed)
Physical Therapy Session Note ? ?Patient Details  ?Name: Micheal Donovan ?MRN: [id number] ?Date of Birth: 12-29-1950 ? ?Today's Date: 06/24/2021 ?PT Individual Time: 7614-7092 and 9574-7340 ?PT Individual Time Calculation (min): 8 min and 46 min ?Today's Date: 06/24/2021 ?PT Missed Time: 52 Minutes and 14 minutes ?Missed Time Reason: Patient fatigue and fatigue/dizziness ? ?Short Term Goals: ?Week 1:  PT Short Term Goal 1 (Week 1): STG=LTG due to LOS ? ?Skilled Therapeutic Interventions/Progress Updates:  ? Treatment Session 1 ?Received pt sidelying in bed asleep, upon wakening pt did not open eyes, make any attempt to move, and only responding to 50% of therapist's questions. Pt denied any pain but reported dizziness is "the same". Briefly reviewed results from vestibular evaluation over the weekend and habituation exercises. However, pt politely declined participation in exercises or OOB mobility this morning, requesting to "rest". Pt left sidelying in bed with all needs within reach. Will attempt to make up time as able. 52 minutes missed of skilled physical therapy due to fatigue.  ? ?Treatment Session 2 ?Received pt semi-reclined in bed using urinal, pt reluctantly agreeable to PT treatment stating "y'all just keep cracking that whip", and reported headache 5/10 - RN notified and present to administer pain medication. Session with emphasis on functional mobility/transfers, generalized strengthening and endurance, dynamic standing balance/coordination, and gait training. Pt transferred semi-reclined<>sitting EOB with HOB elevated and use of bedrails mod I. Pt required extensive break sitting EOB prior to ambulating due to increased dizziness. Sit<>stand with RW and close supervision and pt ambulated 120ft x 2 trials with RW and close supervision to/from dayroom with total A to manage IV pole. Discussed habituation exercises from this weekend and pt reported that with repetition, symptoms improved - encouraged  continuing with these exercises despite discomfort from increased dizziness. Pt expressing desire to return home ASAP and verbalized confidence with all tasks to ensure safe discharge. Pt reports his daughter and wife's sister will be able to provide intermittent assist. Discussed home entry and pt reports 1 STE. Demonstrated technique for navigating step with RW and pt verbalized confidence with task, but politely declined any further mobility and requested to return to room due to fatigue and increased symptoms. Pt was agreeable to the following exercises prior to returning to room and performed with verbal/visual cues: ?-seated levator stretch x15 seconds bilaterally  ?-seated upper trapezius stretch x15 seconds bilaterally ?-seated cervical extension - target blue dot on ceiling 3x25 second hold. Pt reported dot was "jumping" but with increased time spent staring at target, symptoms decreased ?-seated cervical flexion x20 seconds ?Returned to room and transferred sit<>supine mod I. Concluded session with pt semi-reclined in bed, needs within reach, and bed alarm on. 14 minutes missed of skilled physical therapy due to fatigue and dizziness.  ? ?Therapy Documentation ?Precautions:  ?Precautions ?Precautions: Fall ?Precaution Comments: dizziness ?Restrictions ?Weight Bearing Restrictions: No ? ?Therapy/Group: Individual Therapy ?Blenda Nicely ?Becky Sax PT, DPT  ?06/24/2021, 7:16 AM  ?

## 2021-06-24 NOTE — Progress Notes (Signed)
ANTICOAGULATION CONSULT NOTE ? ?Pharmacy Consult for Heparin ?Indication:  pulmonary embolus (s/p CVA with hemorrhagic conversion) ? ?Allergies  ?Allergen Reactions  ? Niaspan [Niacin]   ?  FLUSHING  ? ? ?Patient Measurements: ?Height: 5\' 7"  (170.2 cm) ?Weight: 64.9 kg (143 lb 1.3 oz) ?IBW/kg (Calculated) : 66.1 ? ?Heparin Dosing Weight: 65 kg ? ?Vital Signs: ?Temp: 98 ?F (36.7 ?C) (04/24 0455) ?Temp Source: Oral (04/24 0455) ?BP: 135/81 (04/24 0455) ?Pulse Rate: 77 (04/24 0455) ? ?Labs: ?Recent Labs  ?  06/22/21 ?2122 06/23/21 ?4825 06/24/21 ?0617  ?HGB 14.7 14.6 14.2  ?HCT 44.0 43.3 42.4  ?PLT 244 279 261  ?HEPARINUNFRC 0.30 0.41 0.34  ? ? ?Estimated Creatinine Clearance: 78.9 mL/min (by C-G formula based on SCr of 0.61 mg/dL). ? ?Assessment: ?71 yo M who presented for worsening headaches found to have large IPH involving the left cerebellum infarct likely secondary ischemic. Now found to have right lower lobe PE. OK for heparin per neurology, low-dose stroke protocol. ?  ?Heparin level today remains therapeutic at 0.34 at current rate of 1250 units/hr. Hgb 14.6, plt 279 - stable. No line issues or signs/symptoms of bleeding noted per RN.  ? ?Goal of Therapy:  ?Heparin level 0.3-0.5 units/ml ?Monitor platelets by anticoagulation protocol: Yes ?  ?Plan:  ?Continue heparin infusion at 1250 units/hr ?Check heparin level daily while on heparin ?Continue to monitor H&H and platelets ? ? ? ?Thank you for allowing pharmacy to be a part of this patient?s care. ? ?Ardyth Harps, PharmD ?Clinical Pharmacist ? ? ? ?

## 2021-06-24 NOTE — Progress Notes (Signed)
Pt incontinent of urine and bowels in bed, claimed he didn't know he needed to go. Pt to BR via walker, finished elimination & cleaned. OT present and aware of episode, assist with clean up. ? ?

## 2021-06-24 NOTE — Progress Notes (Signed)
?                                                       PROGRESS NOTE ? ? ?Subjective/Complaints: ?Sleepy this morning ?CBGs above 200s.- appreciate nursing note indicating that family is bringing in [place] and oreos  ? ?ROS:  ? ?Pt denies SOB, abd pain, CP, N/V/C/D, and vision changes ? ?Objective: ?  ?No results found. ? ?Recent Labs  ?  06/23/21 ?0607 06/24/21 ?0617  ?WBC 10.3 11.2*  ?HGB 14.6 14.2  ?HCT 43.3 42.4  ?PLT 279 261  ? ?No results for input(s): NA, K, CL, CO2, GLUCOSE, BUN, CREATININE, CALCIUM in the last 72 hours. ? ? ?Intake/Output Summary (Last 24 hours) at 06/24/2021 0916 ?Last data filed at 06/24/2021 0607 ?Gross per 24 hour  ?Intake 1010.88 ml  ?Output 1325 ml  ?Net -314.12 ml  ?  ? ?Pressure Injury 06/20/21 Sacrum Mid Stage 1 -  Intact skin with non-blanchable redness of a localized area usually over a bony prominence. A pink blanchabe area (Active)  ?06/20/21 1300  ?Location: Sacrum  ?Location Orientation: Mid  ?Staging: Stage 1 -  Intact skin with non-blanchable redness of a localized area usually over a bony prominence.  ?Wound Description (Comments): A pink blanchabe area  ?Present on Admission: Yes  ?   ?Pressure Injury 06/20/21 Heel Right Stage 1 -  Intact skin with non-blanchable redness of a localized area usually over a bony prominence. reddened area (Active)  ?06/20/21 2033  ?Location: Heel  ?Location Orientation: Right  ?Staging: Stage 1 -  Intact skin with non-blanchable redness of a localized area usually over a bony prominence.  ?Wound Description (Comments): reddened area  ?Present on Admission: Yes  ? ? ?Physical Exam: ?Vital Signs ?Blood pressure 135/81, pulse 77, temperature 98 ?F (36.7 ?C), temperature source Oral, resp. rate 18, height 5\' 7"  (1.702 m), weight 64.9 kg, SpO2 98 %. ? ? ? ?General: awake, alert, appropriate, laying supine in bed; NAD, BMI 22.41 ?HENT: eyes closed to prevent HA/dizziness ?CV: regular rate; no JVD ?Pulmonary: CTA B/L; no W/R/R- good air  movement ?GI: soft, NT, ND, (+)BS ?Psychiatric: appropriate ?Neurological: alert ?Skin: intact ?Neurological:  ?   Comments: Patient is alert and makes eye contact with examiner.  Follows simple commands.  Provides name and age.  Fair awareness of deficits. 5/5 strength throughout. Sensation intact throughout. ? ? ?Assessment/Plan: ?1. Functional deficits which require 3+ hours per day of interdisciplinary therapy in a comprehensive inpatient rehab setting. ?Physiatrist is providing close team supervision and 24 hour management of active medical problems listed below. ?Physiatrist and rehab team continue to assess barriers to discharge/monitor patient progress toward functional and medical goals ? ?Care Tool: ? ?Bathing ?   ?Body parts bathed by patient: Right arm, Left arm, Chest, Abdomen, Right upper leg, Left upper leg, Face, Front perineal area, Buttocks  ? Body parts bathed by helper: Right lower leg, Left lower leg, Buttocks, Front perineal area ?Body parts n/a: Right lower leg, Left lower leg ?  ?Bathing assist Assist Level: Minimal Assistance - Patient > 75% ?  ?  ?Upper Body Dressing/Undressing ?Upper body dressing   ?What is the patient wearing?: Pull over shirt ?   ?Upper body assist Assist Level: Set up assist ?   ?Lower Body Dressing/Undressing ?Lower body dressing ? ? ?   ?  What is the patient wearing?: Pants ? ?  ? ?Lower body assist Assist for lower body dressing: Moderate Assistance - Patient 50 - 74% ?   ? ?Toileting ?Toileting    ?Toileting assist Assist for toileting: Moderate Assistance - Patient 50 - 74% ?  ?  ?Transfers ?Chair/bed transfer ? ?Transfers assist ? Chair/bed transfer activity did not occur: Refused (dizziness) ? ?Chair/bed transfer assist level: Minimal Assistance - Patient > 75% ?  ?  ?Locomotion ?Ambulation ? ? ?Ambulation assist ? ? Ambulation activity did not occur: Refused (dizziness) ? ?Assist level: Minimal Assistance - Patient > 75% ?Assistive device: Walker-rolling ?Max  distance: 61ft  ? ?Walk 10 feet activity ? ? ?Assist ? Walk 10 feet activity did not occur: Refused (dizziness) ? ?  ?   ? ?Walk 50 feet activity ? ? ?Assist Walk 50 feet with 2 turns activity did not occur: Refused (dizziness) ? ?  ?   ? ? ?Walk 150 feet activity ? ? ?Assist Walk 150 feet activity did not occur: Refused (dizziness) ? ?  ?  ?  ? ?Walk 10 feet on uneven surface  ?activity ? ? ?Assist Walk 10 feet on uneven surfaces activity did not occur: Refused (dizziness) ? ? ?  ?   ? ?Wheelchair ? ? ? ? ?Assist Is the patient using a wheelchair?: Yes ?Type of Wheelchair: Manual ?Wheelchair activity did not occur: Refused (dizziness) ? ?  ?   ? ? ?Wheelchair 50 feet with 2 turns activity ? ? ? ?Assist ? ?  ?Wheelchair 50 feet with 2 turns activity did not occur: Refused (dizziness) ? ? ?   ? ?Wheelchair 150 feet activity  ? ? ? ?Assist ? Wheelchair 150 feet activity did not occur: Refused (dizziness) ? ? ?   ? ?Blood pressure 135/81, pulse 77, temperature 98 ?F (36.7 ?C), temperature source Oral, resp. rate 18, height 5\' 7"  (1.702 m), weight 64.9 kg, SpO2 98 %. ? ?Medical Problem List and Plan: ?1. 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            -patient may shower ?            -ELOS/Goals: 7-8 days S ?            Conitnue CIR- PT, OT and SLP ?2.  Antithrombotics: ?-DVT/anticoagulation: Findings of pulmonary embolism RLL without heart strain.  Patient cleared to begin low-dose intravenous heparin 06/19/2021 x 7 days then consider transition to Eliquis ? 4/22- on Heparin gtt currently ?            -antiplatelet therapy: N/A ?3. Pain Management: Fioricet as needed headache, tramadol as needed.  Completing Medrol Dosepak for headaches ? 4/22- will try Topamax to help with HA prevention.  ? 4/23- started topamax 25 mg QHS last night- suggest titrating up slowly- however still having HA's so far- con't fioricet prn and tramadol ?4. Mood: Provide emotional support ?            -antipsychotic  agents: N/A ?5. Neuropsych: This patient is capable of making decisions on his own behalf. ?6. Skin/Wound Care: Routine skin checks ?7. Fluids/Electrolytes/Nutrition: Routine in and outs with follow-up chemistries ?8.  Pulmonary mass with hilar adenopathy.  Status post bronchoscopy 06/17/2021.  Path report pending.  Follow-up outpatient oncology ?9.  Uncontrolled diabetes mellitus.  Hemoglobin A1c 13.6.. ?NovoLog 6 units 3 times daily with meals.  Diabetic teaching. CBGs 97-232: increase Semglee to 30U and d/c ISS.  ?  10.  Hypertension. Continue Cozaar 50 mg daily, Norvasc 10 mg daily.  Monitor with increased mobility ?11.  Hyperlipidemia.  Continue Lipitor ?12. Suboptimal vitamin D: start ergocalciferol 50,000 once per week for 7 weeks ?13. History of suboptimal magnesium: normalized ?14. Tachycardia: start Toprol-XL 12.5mg  daily.  ?15. Leukocytosis: stable, continue to montior ? 4/22- likely due to steroids-  ? 4/23- labs today- WBC is normalized- con't to monitor ? ? ?LOS: ?4 days ?A FACE TO FACE EVALUATION WAS PERFORMED ? ?Martha Clan P Jacorey Donaway ?06/24/2021, 9:16 AM  ? ?  ?

## 2021-06-24 NOTE — Progress Notes (Signed)
Occupational Therapy Session Note ? ?Patient Details  ?Name: Micheal Donovan ?MRN: [id number] ?Date of Birth: [birth year] ? ?Today's Date: 06/24/2021 ?OT Individual Time: 4098-1191 ?OT Individual Time Calculation (min): 25 min  ? ? ?Short Term Goals: ?Week 1:  OT Short Term Goal 1 (Week 1): STG = LTG 2/2 ELOS ? ?Skilled Therapeutic Interventions/Progress Updates:  ?Skilled OT intervention completed with focus on habituation exercises for vestibular dysfunction. Pt received upright in bed, agreeable to unplanned OT session. No c/o pain. Completed bed mobility with independence, with increased time needed for transition 2/2 dizziness. Prior to movement pt reported 4/10 dizziness, then seated EOB 7/10. With very slow transition and rest in between in each rep, pt completed 2x3 forward leans for promoting habituation movement, with education provided about purpose, and healing of crystals in inner ear with the movements that cause discomfort dizziness wise. Pt able to tolerate the anterior weight shift to touch each toe with the parallel arm with supervision, with report of 4/10 dizziness and improvement from just sitting EOB. Completed star gazing with eyes fixed on item and pt vertically and horizontally shifting head in all quadrants x4 each, with no increase in dizziness. Instructed pt that he could do tasks like this while laying in bed, to promote movement of the head to also help with crystal alignment with pt reporting he has been doing so in between therapies with it helping some. Pt transitioned back to upright in bed, with independence, no increase in dizziness, bed alarm on and all needs in reach at end of session. ? ? ?Therapy Documentation ?Precautions:  ?Precautions ?Precautions: Fall ?Precaution Comments: dizziness ?Restrictions ?Weight Bearing Restrictions: No ? ? ? ?Therapy/Group: Individual Therapy ? ?Glendell Fouse E Lacora Folmer ?06/24/2021, 1:59 PM ?

## 2021-06-24 NOTE — Progress Notes (Signed)
Family bringing in food-[employer] and oreo's.  Heparin infusing at 12.35ml/hr. PRN fioricet given at 0335 for complaint of HA. Patrici Ranks A  ?

## 2021-06-24 NOTE — Progress Notes (Signed)
Occupational Therapy Session Note ? ?Patient Details  ?Name: Micheal Donovan ?MRN: [id number] ?Date of Birth: [birth year] ? ?Today's Date: 06/24/2021 ?OT Individual Time: 4081-4481 ?OT Individual Time Calculation (min): 69 min  ? ? ?Short Term Goals: ?Week 1:  OT Short Term Goal 1 (Week 1): STG = LTG 2/2 ELOS ? ?Skilled Therapeutic Interventions/Progress Updates:  ?Skilled OT intervention completed with focus on toileting, activity tolerance, vestibular dysfunction education. Pt received in bathroom with nursing present assisting pt with toileting. Per nursing, pt had incontinent BM that had spread all over his leg when doffing brief, with therapist taking over at direct care handoff. Nurse changed out pt's heprin IV bag prior to exit. ? ?Provided pt time to have successful BM, however upon wiping pt with continued loose bowels and noted blood on wash cloth from pt reported hemorrhoids. Notified nurse for need of cream for comfort per pt request. Completed all pericare with supervision at seated level. Min dizziness reported at this time. Sit > stand with RW with CGA and ambulatory transfer with RW to w/c in room with CGA with no increase in dizziness. Seated at sink, pt agreeable to wash up considering messy BM, with pt requiring/therapist offering intermittent rest breaks for dizziness management with pt tolerating more movement this session with this method. Completed UB/LB bathing with supervision, with therapist educating pt on purpose of having him leaning forward for vestibular healing, although pt has increased dizziness with this. Reported 9/10 dizziness with several leans forward, slight improvement with eyes closed rest break. Completed sit > stand with CGA with use of sink for BUE support, with therapist threading clean brief with total A, then seated level pt donned pants over BLE with min A and donned over hips at the stance level with min A for over one hip. Ambulated to EOB with CGA with use of RW, then  donned new gown with min A for IV management. Completed bed mobility with independence, with pt utilizing bed features with independence. ? ?Pt reported being unable to locate his phone, with therapist unable as well, with pt calling from hospital phone and therapist locating it in the linen bag. Wiped down the phone with screen wipe and instructed pt to leave phone on table as much as possible to prevent lost personal items. ? ?Of note- pt was pleasant this session, with increased engagement during session when therapist utilized topics outside of the home, I.e. his hunting dogs, with pt showing therapist on his phone, with min dizziness reported when visually scanning/scrolling on his phone which aligns with positive dizziness with positional movements vs visual scanning. Pt left upright in bed, with bed alarm on and all needs in reach at end of session. ? ? ?Therapy Documentation ?Precautions:  ?Precautions ?Precautions: Fall ?Precaution Comments: dizziness ?Restrictions ?Weight Bearing Restrictions: No ? ?Pain: ?Hemorrhoid pain, unrated, notified nurse ? ?Therapy/Group: Individual Therapy ? ?Lenni Reckner E Feather Berrie ?06/24/2021, 7:34 AM ?

## 2021-06-25 LAB — CBC
HCT: 39 % (ref 39.0–52.0)
Hemoglobin: 13.1 g/dL (ref 13.0–17.0)
MCH: 28.7 pg (ref 26.0–34.0)
MCHC: 33.6 g/dL (ref 30.0–36.0)
MCV: 85.3 fL (ref 80.0–100.0)
Platelets: 256 10*3/uL (ref 150–400)
RBC: 4.57 MIL/uL (ref 4.22–5.81)
RDW: 13 % (ref 11.5–15.5)
WBC: 10 10*3/uL (ref 4.0–10.5)
nRBC: 0 % (ref 0.0–0.2)

## 2021-06-25 LAB — GLUCOSE, CAPILLARY
Glucose-Capillary: 117 mg/dL — ABNORMAL HIGH (ref 70–99)
Glucose-Capillary: 159 mg/dL — ABNORMAL HIGH (ref 70–99)
Glucose-Capillary: 111 mg/dL — ABNORMAL HIGH (ref 70–99)
Glucose-Capillary: 284 mg/dL — ABNORMAL HIGH (ref 70–99)

## 2021-06-25 LAB — HEPARIN LEVEL (UNFRACTIONATED): Heparin Unfractionated: 0.32 IU/mL (ref 0.30–0.70)

## 2021-06-25 MED ORDER — APIXABAN 5 MG PO TABS
5.0000 mg | ORAL_TABLET | Freq: Two times a day (BID) | ORAL | Status: DC
  Administered 2021-06-25 – 2021-06-27 (×5): 5 mg via ORAL
  Filled 2021-06-25 (×5): qty 1

## 2021-06-25 MED ORDER — INSULIN GLARGINE-YFGN 100 UNIT/ML ~~LOC~~ SOLN
31.0000 [IU] | Freq: Every day | SUBCUTANEOUS | Status: DC
  Administered 2021-06-26: 31 [IU] via SUBCUTANEOUS
  Filled 2021-06-25: qty 0.31

## 2021-06-25 MED ORDER — TOPIRAMATE 25 MG PO TABS
50.0000 mg | ORAL_TABLET | Freq: Every day | ORAL | Status: DC
  Administered 2021-06-25: 50 mg via ORAL
  Filled 2021-06-25: qty 2

## 2021-06-25 NOTE — Progress Notes (Signed)
Patient ID: Micheal Donovan, male   DOB: [birth year], 71 y.o.   MRN: [id number] ? ?SW left VM for patient daughter in reference to family education. Sw will wait for follow up ?

## 2021-06-25 NOTE — Progress Notes (Signed)
Physical Therapy Session Note ? ?Patient Details  ?Name: Micheal Donovan ?MRN: [id number] ?Date of Birth: 1950/04/21 ? ?Today's Date: 06/25/2021 ?PT Individual Time: 4782-9562 and 1308-6578 ?PT Individual Time Calculation (min): 53 min and 70 min ? ?Short Term Goals: ?Week 1:  PT Short Term Goal 1 (Week 1): STG=LTG due to LOS ? ?Skilled Therapeutic Interventions/Progress Updates:  ? Treatment Session 1 ?Received pt semi-reclined in bed, pt agreeable to PT treatment, and reported pounding headache (premedicated) and continued dizziness (un-rated). Pt also expressing eagerness to D/C but agreeable to participate in session while waiting for MD. Session with emphasis on functional mobility/transfers, generalized strengthening and endurance, habituation exercises, and gait training. Pt transferred semi-reclined<>sitting EOB mod I and required increased time sitting EOB for dizziness to decrease. Sit<>stand with RW and supervision and ambulated 43ft with RW and supervision (assist to hold pants up) with total A to manage IV pole. Pt stopped multiple times due to increased dizziness and ultimately had to sit in [facility] and be transported to dayroom remainder of way. MD present for morning rounds and discussed discharge, family support, heparin management, and CLOF. Pt then performed the following habituation exercises to tolerance: ?-cervical flexion/extension - eyes open x8 - pt reported increased nausea but subsided with rest ?-L/R head turns - eyes open x10 ?-VOR/gaze stabilization x 1 - eyes open x8 ?-Saccades - eyes open x20 ?Of note, pt required increased time with habituation exercises and frequently closing eyes in between exercises due to increased dizziness/nausea. Returned to room and ambulated 77ft with RW and supervision back to bed and transferred sit<>supine mod I. Concluded session with pt semi-reclined in bed, needs within reach, and bed alarm on.  ? ?Treatment Session 2 ?Received pt semi-reclined in bed, pt  agreeable to PT treatment, and reported headache pain 6/10 - RN notified and present to administer pain medication. Pt also reported dizziness 6/10. Session with emphasis on functional mobility/transfers, generalized strengthening and endurance, stair navigation, simulated car transfers, dynamic standing balance/coordination, and ambulation. Pt performed bed mobility mod I and transferred bed<>WC stand<>pivot without AD and CGA. Pt transported to/from room in [facility] dependently for time management/energy conservation purposes. Pt reported having 1 STE with 0 rails. Demonstrated technique for curb navigation and pt ascended/descended 1 4in curb with RW and CGA x 2 trials - required cues for proximity to RW before lifting/lowering to/from step. In ortho gym, pt performed ambulatory simulated car transfer with RW and supervision and ambulated 29ft on uneven surfaces (ramp) with RW and supervision. Discussed follow up therapy options and pt politely declined HHPT or OPPT despite recommendations, but was agreeable to therapist providing HEP printout. Pt insisting on returning to room and transferred WC<>bed stand<>pivot without AD and CGA and sit<>supine mod I. Provided pt with HEP (for habituation exercises and LE strengthening exercises) and educated on frequency/duration/technique for the following exercises: ?- Seated Gaze Stabilization with Head Nod  - 1 x daily - 7 x weekly - 2 sets - 10 reps ?- Seated Gaze Stabilization with Head Rotation  - 1 x daily - 7 x weekly - 2 sets - 10 reps ?- Seated Horizontal Saccades  - 1 x daily - 7 x weekly - 2 sets - 20 reps ?- Seated Vertical Saccades  - 1 x daily - 7 x weekly - 2 sets - 20 reps ?- Standing March with Counter Support  - 1 x daily - 7 x weekly - 2 sets - 15 reps ?- Mini Squat with Counter Support  -  1 x daily - 7 x weekly - 2 sets - 10 reps ?- Standing Hip Abduction with Counter Support  - 1 x daily - 7 x weekly - 2 sets - 10 reps ?- Heel Raises with Counter Support  -  1 x daily - 7 x weekly - 2 sets - 12 reps ?Provided pt with Health Resource Notebook to keep important documents. Concluded session with pt semi-reclined in bed, needs within reach, and bed alarm on.  ? ?Therapy Documentation ?Precautions:  ?Precautions ?Precautions: Fall ?Precaution Comments: dizziness ?Restrictions ?Weight Bearing Restrictions: No ? ?Therapy/Group: Individual Therapy ?Blenda Nicely ?Becky Sax PT, DPT  ?06/25/2021, 7:12 AM  ?

## 2021-06-25 NOTE — Progress Notes (Signed)
?                                                       PROGRESS NOTE ? ? ?Subjective/Complaints: ?CBG above 400- added short acting insulin and increased long acting dose to 31. Discussed with patient, provided dietary education ? ?ROS:  ? ?Pt denies SOB, abd pain, CP, N/V/C/D, and vision changes ? ?Objective: ?  ?No results found. ? ?Recent Labs  ?  06/24/21 ?0617 06/25/21 ?6160  ?WBC 11.2* 10.0  ?HGB 14.2 13.1  ?HCT 42.4 39.0  ?PLT 261 256  ? ?No results for input(s): NA, K, CL, CO2, GLUCOSE, BUN, CREATININE, CALCIUM in the last 72 hours. ? ? ?Intake/Output Summary (Last 24 hours) at 06/25/2021 0908 ?Last data filed at 06/24/2021 1528 ?Gross per 24 hour  ?Intake 413 ml  ?Output 525 ml  ?Net -112 ml  ?  ? ?Pressure Injury 06/20/21 Sacrum Mid Unstageable - Full thickness tissue loss in which the base of the injury is covered by slough (yellow, tan, gray, green or brown) and/or eschar (tan, brown or black) in the wound bed. changed from a stage 1 to unstagea (Active)  ?06/20/21 1300  ?Location: Sacrum  ?Location Orientation: Mid  ?Staging: Unstageable - Full thickness tissue loss in which the base of the injury is covered by slough (yellow, tan, gray, green or brown) and/or eschar (tan, brown or black) in the wound bed.  ?Wound Description (Comments): changed from a stage 1 to unstageable  ?Present on Admission: Yes  ? ? ?Physical Exam: ?Vital Signs ?Blood pressure 124/81, pulse 73, temperature 98.2 ?F (36.8 ?C), resp. rate 18, height 5\' 7"  (1.702 m), weight 64.9 kg, SpO2 98 %. ? ? ? ?General: awake, alert, appropriate, laying supine in bed; NAD, BMI 22.41 ?HENT: eyes closed to prevent HA/dizziness ?CV: regular rate; no JVD ?Pulmonary: CTA B/L; no W/R/R- good air movement ?GI: soft, NT, ND, (+)BS ?Psychiatric: appropriate, flat ?Skin: heparin IV in place ?Neurological:  ?   Comments: Patient is alert and makes eye contact with examiner.  Follows simple commands.  Provides name and age.  Fair awareness of deficits.  5/5 strength throughout. Sensation intact throughout. ? ? ?Assessment/Plan: ?1. Functional deficits which require 3+ hours per day of interdisciplinary therapy in a comprehensive inpatient rehab setting. ?Physiatrist is providing close team supervision and 24 hour management of active medical problems listed below. ?Physiatrist and rehab team continue to assess barriers to discharge/monitor patient progress toward functional and medical goals ? ?Care Tool: ? ?Bathing ?   ?Body parts bathed by patient: Right arm, Left arm, Chest, Abdomen, Right upper leg, Left upper leg, Face, Front perineal area, Buttocks, Right lower leg, Left lower leg  ? Body parts bathed by helper: Right lower leg, Left lower leg, Buttocks, Front perineal area ?Body parts n/a: Right lower leg, Left lower leg ?  ?Bathing assist Assist Level: Contact Guard/Touching assist ?  ?  ?Upper Body Dressing/Undressing ?Upper body dressing   ?What is the patient wearing?: [place] only ?   ?Upper body assist Assist Level: Minimal Assistance - Patient > 75% ?   ?Lower Body Dressing/Undressing ?Lower body dressing ? ? ?   ?What is the patient wearing?: Incontinence brief, Pants ? ?  ? ?Lower body assist Assist for lower body dressing: Minimal Assistance - Patient >  75% ?   ? ?Toileting ?Toileting    ?Toileting assist Assist for toileting: Contact Guard/Touching assist ?  ?  ?Transfers ?Chair/bed transfer ? ?Transfers assist ? Chair/bed transfer activity did not occur: Refused (dizziness) ? ?Chair/bed transfer assist level: Supervision/Verbal cueing ?  ?  ?Locomotion ?Ambulation ? ? ?Ambulation assist ? ? Ambulation activity did not occur: Refused (dizziness) ? ?Assist level: Supervision/Verbal cueing ?Assistive device: Walker-rolling ?Max distance: 146ft  ? ?Walk 10 feet activity ? ? ?Assist ? Walk 10 feet activity did not occur: Refused (dizziness) ? ?Assist level: Supervision/Verbal cueing ?Assistive device: Walker-rolling  ? ?Walk 50 feet  activity ? ? ?Assist Walk 50 feet with 2 turns activity did not occur: Refused (dizziness) ? ?Assist level: Supervision/Verbal cueing ?Assistive device: Walker-rolling  ? ? ?Walk 150 feet activity ? ? ?Assist Walk 150 feet activity did not occur: Refused (dizziness) ? ?  ?  ?  ? ?Walk 10 feet on uneven surface  ?activity ? ? ?Assist Walk 10 feet on uneven surfaces activity did not occur: Refused (dizziness) ? ? ?  ?   ? ?Wheelchair ? ? ? ? ?Assist Is the patient using a wheelchair?: Yes ?Type of Wheelchair: Manual ?Wheelchair activity did not occur: Refused (dizziness) ? ?  ?   ? ? ?Wheelchair 50 feet with 2 turns activity ? ? ? ?Assist ? ?  ?Wheelchair 50 feet with 2 turns activity did not occur: Refused (dizziness) ? ? ?   ? ?Wheelchair 150 feet activity  ? ? ? ?Assist ? Wheelchair 150 feet activity did not occur: Refused (dizziness) ? ? ?   ? ?Blood pressure 124/81, pulse 73, temperature 98.2 ?F (36.8 ?C), resp. rate 18, height 5\' 7"  (1.702 m), weight 64.9 kg, SpO2 98 %. ? ?Medical Problem List and Plan: ?1. 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            -patient may shower ?            -ELOS/Goals: 7-8 days S ?            Continue CIR- PT, OT and SLP ? HFU scheduled ?2.  Findings of pulmonary embolism RLL without heart strain.  Patient cleared to begin low-dose intravenous heparin 06/19/2021 x 7 days then consider transition to Eliquis ?4/25: will touch base with neurology regarding whether we can transition to Eliquis in anticipation of discharge home soon.  ?            -antiplatelet therapy: N/A ?3. Headache: Fioricet as needed headache, tramadol as needed.  Completing Medrol Dosepak for headaches. Increase Topamax to 50mg  HS.  ?4. Mood: Provide emotional support ?            -antipsychotic agents: N/A ?5. Neuropsych: This patient is capable of making decisions on his own behalf. ?6. Skin/Wound Care: Routine skin checks ?7. Fluids/Electrolytes/Nutrition: Routine in and outs with  follow-up chemistries ?8.  Pulmonary mass with hilar adenopathy.  Status post bronchoscopy 06/17/2021.  Path report pending.  Follow-up outpatient oncology ?9.  Uncontrolled diabetes mellitus.  Hemoglobin A1c 13.6.. ?NovoLog 6 units 3 times daily with meals.  Diabetic teaching. CBGs 97-232: Increase Semglee to 31U and d/c ISS.  ?10.  Hypertension. Continue Cozaar 50 mg daily, Norvasc 10 mg daily.  Monitor with increased mobility ?11.  Hyperlipidemia.  Continue Lipitor ?12. Suboptimal vitamin D: start ergocalciferol 50,000 once per week for 7 weeks ?13. History of suboptimal magnesium: normalized ?14. Tachycardia: start Toprol-XL 12.5mg  daily.  ?  15. Leukocytosis: stable, continue to montior ? 4/22- likely due to steroids-  ? 4/23- labs today- WBC is normalized- con't to monitor ? ? ?LOS: ?5 days ?A FACE TO FACE EVALUATION WAS PERFORMED ? ?Martha Clan P Ayvin Lipinski ?06/25/2021, 9:08 AM  ? ?  ?

## 2021-06-25 NOTE — Progress Notes (Signed)
Occupational Therapy Session Note ? ?Patient Details  ?Name: Micheal Donovan ?MRN: [id number] ?Date of Birth: 01/04/1951 ? ?Today's Date: 06/25/2021 ?OT Individual Time: 1962-2297 & 9892-1194 ?OT Individual Time Calculation (min): 55 min & 38 min ? ? ?Short Term Goals: ?Week 1:  OT Short Term Goal 1 (Week 1): STG = LTG 2/2 ELOS ? ?Skilled Therapeutic Interventions/Progress Updates:  ?Session 1 ?Skilled OT intervention completed with focus on self-care, functional transfers. Pt received upright in bed, 7/10 pain reported. Notified RN for meds, however not due for them at this time, with therapist providing rest breaks, repositioning and calming self-care tasks for pain management. With heparin IV running, pt unable to take full shower, but was agreeable to hair washing via tray. Completed bed mobility with mod I, with slow transitions required for dizziness as well as throughout session for ambulatory transfers with supervision using RW. Seated in w/c at sink, pt tolerated cervical extension during hair washing with no worsening of dizziness reported, with therapist completing all hair tasks with total A. Lab services at pt side to withdraw blood. Pt was able to towel dry his hair and comb it with set up A. Pt preferring to put on new pants that fit correctly. Completed LB dressing with min A, with therapist encouraging anterior leans for habituation treatment and pt able to do so with doffing/donning, however increased throbbing pain reported in back of head therefore therapist stepped in. Doffed brief with total A, and was noted to be soiled with urinary void, with pt unaware. Discussed with pt about incontinence and urgencies, with pt stating he can't get to the urinal in time, with therapist lightly educating timed toileting option to prevent new incontinence. Able to donn over hips in stance with supervision. Pt completed bed mobility with mod I, then was left upright in bed, with bed alarm on and all needs in reach  at end session. ? ?Session 2 ?Skilled OT intervention completed with focus on shower transfers and safety education. Pt received upright in bed, agreeable to unplanned session. Min pain reported, with nurse in room providing meds for scheduled meds. Emphasis of session regarding step in shower transfers, with therapist demonstrating side stepping method with use of RW and grab bars. Pt was able to return demonstrate ambulatory transfer into bathroom with CGA using RW 2/2 incline, then CGA for stepping into shower onto his L then supervision for sitting onto tub bench. Required cues for safety, RW positioning and preventing entanglement with IV pole. 4/10 dizziness reported after shower transfer, which is minimal for him. Education provided on suggested use of [facility] for ease of bathing, shower curtain management to prevent water spillage, minimizing stands via lateral leans for pericare, use of grab bars/installation as well as effective safety strategies for exiting the shower to eliminate falls. Pt with improved exit out of shower, with close supervision needed with use of RW. Discussed OT f/u and options of OP vs HH. Completed bed mobility with mod I, then was left upright in bed, with bed alarm on and all needs in reach at end of session. ? ? ? ?Therapy Documentation ?Precautions:  ?Precautions ?Precautions: Fall ?Precaution Comments: dizziness ?Restrictions ?Weight Bearing Restrictions: No ? ? ? ? ?Therapy/Group: Individual Therapy ? ?Micheal Donovan E [name] ?06/25/2021, 12:18 PM ?

## 2021-06-25 NOTE — Progress Notes (Signed)
Patient's CBG was 429, no signs & symptoms of hyperglycemia. Patient states that he had [employer] that his son brought him. Also noticed some cookies & other treats on his bedside table. He stated that his blood sugar had dropped recently & he had him bring the snacks to prevent it when he did not eat. He stated that he will not eat it. Patient educated on risks of hyperglycemia & being careful with what he eats. On call provider was called & orders were given. Repeat CBG was 308. No other interventions given if CBG was lower than 400. Will continue to monitor. ?

## 2021-06-25 NOTE — Progress Notes (Signed)
Contacted Dr. Leonie Man regarding transitioning to Eliquis. With his approval, I have placed consult to pharmacy to start Eliquis. ?

## 2021-06-25 NOTE — Progress Notes (Addendum)
ANTICOAGULATION CONSULT NOTE - Follow-Up ? ?Pharmacy Consult for Transition from Heparin Drip to Apixaban ?Indication:  pulmonary embolus (s/p CVA with hemorrhagic conversion) ? ?Allergies  ?Allergen Reactions  ? Niaspan [Niacin]   ?  FLUSHING  ? ? ?Patient Measurements: ?Height: 5\' 7"  (170.2 cm) ?Weight: 64.9 kg (143 lb 1.3 oz) ?IBW/kg (Calculated) : 66.1 ? ?Vital Signs: ?Temp: 98.2 ?F (36.8 ?C) (04/25 7342) ?BP: 124/81 (04/25 0512) ?Pulse Rate: 73 (04/25 0512) ? ?Labs: ?Recent Labs  ?  06/23/21 ?0607 06/24/21 ?0617 06/25/21 ?8768 06/25/21 ?1036  ?HGB 14.6 14.2 13.1  --   ?HCT 43.3 42.4 39.0  --   ?PLT 279 261 256  --   ?HEPARINUNFRC 0.41 0.34  --  0.32  ? ? ? ?Estimated Creatinine Clearance: 78.9 mL/min (by C-G formula based on SCr of 0.61 mg/dL). ? ?Assessment: ?71 yo M who presented for worsening headaches found to have large IPH involving the left cerebellum infarct likely secondary ischemic. Now found to have right lower lobe PE. OK for heparin per neurology, low-dose stroke protocol. ?  ?Transitioning from heparin infusion to apixaban. Hgb 14.6, plt 279 - stable. No line issues or signs/symptoms of bleeding noted per RN.  ? ?Goal of Therapy:  ?Monitor platelets by anticoagulation protocol: Yes ?  ?Plan:  ?Stop heparin infusion  ?Start apixaban 5 mg PO BID (spoke with Dr. Ranell Patrick, she would prefer to omit the last 3 days of loading phase d/t risk of bleed) ?Continue to monitor H&H and platelets ? ? ? ?Thank you for allowing pharmacy to be a part of this patient?s care. Pharmacy will sign off the consult and continue to monitor patient, making recommendations as needed. ? ?Dani Anastasovites BS ?Pharmacy Student ?06/25/2021 11:55 AM ? ?"When you reach the end of your rope, tie a not in it and hang on" - Franklin D. Roosevelt ? ?----------------------------------------------------------------------------------------------------------------- ? ?I agree with the contents of this note ? ?Eleanna Theilen BS, PharmD,  BCPS ?Clinical Pharmacist ?06/25/2021 12:14 PM ? ?Contact: [phone] after 3 PM ? ?"Be curious, not judgmental..." -Jamal Maes ? ? ?

## 2021-06-26 LAB — GLUCOSE, CAPILLARY
Glucose-Capillary: 111 mg/dL — ABNORMAL HIGH (ref 70–99)
Glucose-Capillary: 157 mg/dL — ABNORMAL HIGH (ref 70–99)
Glucose-Capillary: 106 mg/dL — ABNORMAL HIGH (ref 70–99)
Glucose-Capillary: 172 mg/dL — ABNORMAL HIGH (ref 70–99)

## 2021-06-26 LAB — CBC
HCT: 40.6 % (ref 39.0–52.0)
Hemoglobin: 13.5 g/dL (ref 13.0–17.0)
MCH: 28.7 pg (ref 26.0–34.0)
MCHC: 33.3 g/dL (ref 30.0–36.0)
MCV: 86.2 fL (ref 80.0–100.0)
Platelets: 299 10*3/uL (ref 150–400)
RBC: 4.71 MIL/uL (ref 4.22–5.81)
RDW: 12.9 % (ref 11.5–15.5)
WBC: 10.2 10*3/uL (ref 4.0–10.5)
nRBC: 0 % (ref 0.0–0.2)

## 2021-06-26 MED ORDER — GUAIFENESIN ER 600 MG PO TB12: 1200.0000 mg | ORAL_TABLET | Freq: Two times a day (BID) | ORAL | 0 refills | Status: DC

## 2021-06-26 MED ORDER — APIXABAN 5 MG PO TABS: 5.0000 mg | ORAL_TABLET | Freq: Two times a day (BID) | ORAL | 0 refills | Status: DC

## 2021-06-26 MED ORDER — INSULIN GLARGINE-YFGN 100 UNIT/ML ~~LOC~~ SOLN
32.0000 [IU] | Freq: Every day | SUBCUTANEOUS | Status: DC
  Filled 2021-06-26: qty 0.32

## 2021-06-26 MED ORDER — ATORVASTATIN CALCIUM 40 MG PO TABS: 40.0000 mg | ORAL_TABLET | Freq: Every day | ORAL | 0 refills | Status: DC

## 2021-06-26 MED ORDER — BUTALBITAL-APAP-CAFFEINE 50-325-40 MG PO TABS: 1.0000 | ORAL_TABLET | Freq: Four times a day (QID) | ORAL | 0 refills | Status: DC | PRN

## 2021-06-26 MED ORDER — TOPIRAMATE 25 MG PO TABS
75.0000 mg | ORAL_TABLET | Freq: Every day | ORAL | Status: DC
  Administered 2021-06-26: 75 mg via ORAL
  Filled 2021-06-26: qty 3

## 2021-06-26 MED ORDER — INSULIN GLARGINE 100 UNIT/ML SOLOSTAR PEN: 32.0000 [IU] | PEN_INJECTOR | Freq: Every day | SUBCUTANEOUS | 11 refills | Status: DC

## 2021-06-26 MED ORDER — LOSARTAN POTASSIUM 50 MG PO TABS: ORAL_TABLET | ORAL | 0 refills | Status: DC

## 2021-06-26 MED ORDER — VITAMIN D (ERGOCALCIFEROL) 1.25 MG (50000 UNIT) PO CAPS: 50000.0000 [IU] | ORAL_CAPSULE | ORAL | 0 refills | Status: DC

## 2021-06-26 MED ORDER — TRAMADOL HCL 50 MG PO TABS: 100.0000 mg | ORAL_TABLET | Freq: Four times a day (QID) | ORAL | 0 refills | Status: DC | PRN

## 2021-06-26 MED ORDER — PANTOPRAZOLE SODIUM 40 MG PO TBEC: 40.0000 mg | DELAYED_RELEASE_TABLET | Freq: Every day | ORAL | 0 refills | Status: DC

## 2021-06-26 MED ORDER — MECLIZINE HCL 12.5 MG PO TABS
12.5000 mg | ORAL_TABLET | Freq: Two times a day (BID) | ORAL | 0 refills | Status: DC | PRN
Start: 2021-06-26 — End: 2021-08-23

## 2021-06-26 MED ORDER — TOPIRAMATE 25 MG PO TABS
75.0000 mg | ORAL_TABLET | Freq: Every day | ORAL | 0 refills | Status: DC
Start: 2021-06-26 — End: 2021-06-27

## 2021-06-26 MED ORDER — AMLODIPINE BESYLATE 10 MG PO TABS: 10.0000 mg | ORAL_TABLET | Freq: Every day | ORAL | 0 refills | Status: DC

## 2021-06-26 MED ORDER — MOMETASONE FURO-FORMOTEROL FUM 200-5 MCG/ACT IN AERO: 2.0000 | INHALATION_SPRAY | Freq: Two times a day (BID) | RESPIRATORY_TRACT | 0 refills | Status: DC

## 2021-06-26 MED ORDER — METOPROLOL SUCCINATE ER 25 MG PO TB24: 12.5000 mg | ORAL_TABLET | Freq: Every day | ORAL | 0 refills | Status: DC

## 2021-06-26 MED ORDER — LORATADINE 10 MG PO TABS: 10.0000 mg | ORAL_TABLET | Freq: Every day | ORAL | 0 refills | Status: DC

## 2021-06-26 MED ORDER — INSULIN GLARGINE-YFGN 100 UNIT/ML ~~LOC~~ SOLN
33.0000 [IU] | Freq: Every day | SUBCUTANEOUS | Status: DC
  Administered 2021-06-27: 33 [IU] via SUBCUTANEOUS
  Filled 2021-06-26: qty 0.33

## 2021-06-26 NOTE — Progress Notes (Signed)
Patient ID: Micheal Donovan, male   DOB: 05/28/1950, 70 y.o.   MRN: [id number] ?Team Conference Report to Patient/Family ? ?Team Conference discussion was reviewed with the patient and caregiver, including goals, any changes in plan of care and target discharge date.  Patient and caregiver express understanding and are in agreement.  The patient has a target discharge date of 06/27/21. ? ?Sw met with patient and provided team conference updates. The patient is happy with d/c date and working on arranging transportation for tomorrow. No additional questions or concerns, sw will follow up with patient on transport for tomorrow.  ? ?Christina J Baskerville ?06/26/2021, 2:36 PM  ?

## 2021-06-26 NOTE — Progress Notes (Signed)
Occupational Therapy Session Note ? ?Patient Details  ?Name: Micheal Donovan ?MRN: [id number] ?Date of Birth: 1950-08-25 ? ?Today's Date: 06/26/2021 ?OT Individual Time: 2620-3559 ?OT Individual Time Calculation (min): 63 min  and Today's Date: 06/26/2021 ?OT Missed Time: 12 Minutes ?Missed Time Reason: Patient ill (comment) (nausea and dizziness) ? ? ?Short Term Goals: ?Week 1:  OT Short Term Goal 1 (Week 1): STG = LTG 2/2 ELOS ? ?Skilled Therapeutic Interventions/Progress Updates:  ?  Treatment session with focus on functional transfers and self-care retraining in preparation for d/c home.  Pt reports that his daughter will be staying with him initially to provide supervision/assist as needed.  Pt reports fatigue and dizziness but ultimately agreeable to shower this session.  Pt completed bed mobility Mod I and ambulated to room shower with RW with Supervision.  Pt required increased time during ambulation and transfer due to reports of dizziness.  Pt completed bathing and dressing at overall Mod I level requiring more than reasonable time due to fatigue and dizziness.  Pt reports increased dizziness and nausea after donning pants due to frequent head up/down movements.  Pt requested to return to bed post shower.  Pt ambulated back to bed with RW with slow gait due to nausea dizziness however no increased physical assistance.  Pt returned to supine and left in bed with eyes closed. ? ?Therapy Documentation ?Precautions:  ?Precautions ?Precautions: Fall ?Precaution Comments: dizziness ?Restrictions ?Weight Bearing Restrictions: No ?General: ?General ?OT Amount of Missed Time: 12 Minutes ?PT Missed Treatment Reason: Patient fatigue;Other (Comment) (dizziness) ? ?Pain: ? Pt with c/o pain 4/10 overall.  Location not speficied. ? ? ? ?Therapy/Group: Individual Therapy ? ?[name] ?06/26/2021, 12:24 PM ?

## 2021-06-26 NOTE — Progress Notes (Addendum)
Physical Therapy Discharge Summary ? ?Patient Details  ?Name: Micheal Donovan ?MRN: [id number] ?Date of Birth: 05-02-1950 ? ?Patient has met 4 of 8 long term goals due to improved balance, ability to compensate for deficits, improved awareness, and improved coordination.  Patient to discharge at an ambulatory level Supervision. Pt reports he will have intermittent supervision provided by daughter. However, pt's wife is currently in the ICU and will not be available to provide any assist upon discharge. Family education not completed due to pt's request to discharge ASAP.  ? ?Reasons goals not met: Pt did not meet ambulation goal of mod I as pt currently requires supervision to ambulate up to 155ft with RW due to dizziness and balance deficits. Pt did not meet bed mobility goal of independent as pt currently relies on use of bed features. Pt did not meet stair goal of 2 steps with CGA as pt is currently only able to navigate 1 step using RW and CGA due to dizziness, generalized weakness/deconditioning, and balance deficits.  ? ?Recommendation:  ?Patient will benefit from ongoing skilled PT services in home health setting to continue to advance safe functional mobility, address ongoing impairments in transfers, generalized strengthening and endurance, dynamic standing balance/coordination, gait training, habituation exercises, and to minimize fall risk. ? ?Equipment: ?No equipment provided - pt already has RW ? ?Reasons for discharge: treatment goals met and discharge from hospital ? ?Patient/family agrees with progress made and goals achieved: Yes ? ?PT Discharge ?Precautions/Restrictions ?Precautions ?Precautions: Fall ?Precaution Comments: dizziness ?Restrictions ?Weight Bearing Restrictions: No ?Pain Interference ?Pain Interference ?Pain Effect on Sleep: 0. Does not apply - I have not had any pain or hurting in the past 5 days ?Pain Interference with Therapy Activities: 1. Rarely or not at all ?Pain Interference with  Day-to-Day Activities: 1. Rarely or not at all ?Cognition ?Overall Cognitive Status: Within Functional Limits for tasks assessed ?Arousal/Alertness: Lethargic ?Orientation Level: Oriented X4 ?Memory: Impaired ?Memory Impairment: Storage deficit;Retrieval deficit ?Awareness: Appears intact ?Problem Solving: Appears intact ?Safety/Judgment: Appears intact ?Sensation ?Sensation ?Light Touch: Appears Intact ?Proprioception: Appears Intact ?Coordination ?Gross Motor Movements are Fluid and Coordinated: Yes ?Fine Motor Movements are Fluid and Coordinated: No ?Coordination and Movement Description: central vestibular dysfunction resulting in dizziness and altered balance strategies ?Finger Nose Finger Test: mild dysmetria bilaterally and slow ?Heel Shin Test: slow but ROM WFL ?Motor  ?Motor ?Motor: Within Functional Limits ?Motor - Skilled Clinical Observations: central vestibular dysfunction resulting in dizziness and altered balance strategies  ?Mobility ?Bed Mobility ?Bed Mobility: Rolling Right;Rolling Left;Sit to Supine;Supine to Sit ?Rolling Right: Independent with assistive device ?Rolling Left: Independent with assistive device ?Supine to Sit: Independent with assistive device ?Sit to Supine: Independent with assistive device ?Transfers ?Transfers: Sit to Stand;Stand to [employer] Transfers ?Sit to Stand: Independent with assistive device ?Stand to Sit: Independent with assistive device ?Stand Pivot Transfers: Independent with assistive device ?Transfer (Assistive device): Rolling walker ?Locomotion  ?Gait ?Ambulation: Yes ?Gait Assistance: Supervision/Verbal cueing ?Gait Distance (Feet): 125 Feet ?Assistive device: Rolling walker ?Gait Assistance Details: Verbal cues for safe use of DME/AE ?Gait Assistance Details: verbal cues for energy conservation ?Gait ?Gait: Yes ?Gait Pattern: Impaired ?Gait Pattern: Step-through pattern;Decreased step length - right;Decreased step length - left;Decreased stride  length;Trunk flexed;Poor foot clearance - right;Poor foot clearance - left;Narrow base of support ?Gait velocity: decreased ?Stairs / Additional Locomotion ?Stairs: Yes ?Stairs Assistance: Contact Guard/Touching assist ?Stair Management Technique: With walker ?Number of Stairs: 1 ?Height of Stairs: 4 ?Ramp: Supervision/Verbal cueing (RW) ?Curb:  Contact Guard/Touching assist (RW and 4in curb) ?Pick up small object from the floor assist level: Dependent - Patient 0% ?Wheelchair Mobility ?Wheelchair Mobility: No  ?Trunk/Postural Assessment  ?Cervical Assessment ?Cervical Assessment: Within Functional Limits ?Thoracic Assessment ?Thoracic Assessment: Within Functional Limits ?Lumbar Assessment ?Lumbar Assessment: Within Functional Limits ?Postural Control ?Postural Control: Within Functional Limits  ?Balance ?Balance ?Balance Assessed: Yes ?Static Sitting Balance ?Static Sitting - Balance Support: Feet supported;Bilateral upper extremity supported ?Static Sitting - Level of Assistance: 6: Modified independent (Device/Increase time) ?Dynamic Sitting Balance ?Dynamic Sitting - Balance Support: Feet supported;No upper extremity supported ?Dynamic Sitting - Level of Assistance: 6: Modified independent (Device/Increase time) ?Static Standing Balance ?Static Standing - Balance Support: Bilateral upper extremity supported (RW) ?Static Standing - Level of Assistance: 6: Modified independent (Device/Increase time) ?Dynamic Standing Balance ?Dynamic Standing - Balance Support: Bilateral upper extremity supported (RW) ?Dynamic Standing - Level of Assistance: 6: Modified independent (Device/Increase time) ?Dynamic Standing - Comments: with transfers only ?Extremity Assessment  ?RLE Assessment ?RLE Assessment: Exceptions to [facility] ?General Strength Comments: grossly 4-/5 ?LLE Assessment ?LLE Assessment: Exceptions to [facility] ?General Strength Comments: grossly 4-/5 ? ?Blenda Nicely ?Becky Sax PT, DPT  ?06/26/2021, 8:39 AM ?

## 2021-06-26 NOTE — Progress Notes (Signed)
Physical Therapy Session Note ? ?Patient Details  ?Name: Micheal Donovan ?MRN: [id number] ?Date of Birth: Nov 14, 1950 ? ?Today's Date: 06/26/2021 ?PT Individual Time: 3016-0109 and 3235-5732 ?PT Individual Time Calculation (min): 44 min  and 14 min ?Today's Date: 06/26/2021 ?PT Missed Time: 16 Minutes and 31 minutes  ?Missed Time Reason: Patient fatigue;Other (Comment) (dizziness) ? ?Short Term Goals: ?Week 1:  PT Short Term Goal 1 (Week 1): STG=LTG due to LOS ? ?Skilled Therapeutic Interventions/Progress Updates:  ? Treatment Session 1 ?Received pt semi-reclined in bed, pt agreeable to PT treatment, and reported pain was "not bad" and denied any dizziness resting in bed. Session with emphasis on discharge planning, functional mobility/transfers, generalized strengthening and endurance, and gait training. Pt performed bed mobility mod I with HOB elevated and use of bedrails and performed all transfers with RW mod I throughout session. Pt began ambulating out of room but stopped due to increased "tightness" in neck. Sat in [facility] and performed the following exercises/stretches with emphasis on cervical ROM and habituation exercises. MD present for morning rounds and discussed possible discharge tomorrow as long as shower with OT goes well today. ?-cervical flexion/extension x 5 bilaterally (eyes open) ?-cervical L/R rotation x5 bilaterally (eyes open) ?-upper trapezius stretch 3x10 seconds bilaterally (eyes closed) ?Pt reported increased headache to 4/10 with exercises - RN notified to administer pain medication. Pt then ambulated 29ft with RW and supervision into hallway. Pt ambulates at significantly decreased speed, frequently stopping and standing still due to increased dizziness. Returned to room, took extensive seated break, then requested to return to bed. Pt ambulated 32ft with RW and supervision back to bed and transferred sit<>supine mod I. Concluded session with pt semi-reclined in bed, needs within reach, and bed  alarm on. 14 minutes missed of skilled physical therapy due to fatigue and dizziness.  ? ?Treatment Session 2 ?Received pt semi-reclined in bed, upon entering pt immediately stated "I'm dizzy" and reported 6/10 headache pain and dizziness 4/10. RN notified to administer pain medication. Pt politely declined OOB mobility or ambulating this afternoon due to dizziness and reporting have no questions/concerns regarding discharge. Briefly reviewed HEP and informed pt of discharge date tomorrow and pt requesting to call family. Concluded session with pt semi-reclined in bed calling family, needs within reach, and bed alarm on. 31 minutes missed of skilled physical therapy due to fatigue, dizziness, and unwillingness to participate.  ? ?Therapy Documentation ?Precautions:  ?Precautions ?Precautions: Fall ?Precaution Comments: dizziness ?Restrictions ?Weight Bearing Restrictions: No ? ?Therapy/Group: Individual Therapy ?Blenda Nicely ?Becky Sax PT, DPT  ?06/26/2021, 6:52 AM  ?

## 2021-06-26 NOTE — Progress Notes (Signed)
?                                                       PROGRESS NOTE ? ? ?Subjective/Complaints: ?CBGs better controlled from 111-284: will increase Lantus to 32U ?Patient wants to go home and is doing well with PT ? ?ROS:  ? ?Pt denies SOB, abd pain, CP, N/V/C/D, and vision changes, +headache ? ?Objective: ?  ?No results found. ? ?Recent Labs  ?  06/25/21 ?2979 06/26/21 ?8921  ?WBC 10.0 10.2  ?HGB 13.1 13.5  ?HCT 39.0 40.6  ?PLT 256 299  ? ?No results for input(s): NA, K, CL, CO2, GLUCOSE, BUN, CREATININE, CALCIUM in the last 72 hours. ? ? ?Intake/Output Summary (Last 24 hours) at 06/26/2021 1058 ?Last data filed at 06/26/2021 1941 ?Gross per 24 hour  ?Intake 720 ml  ?Output 525 ml  ?Net 195 ml  ?  ? ?Pressure Injury 06/20/21 Sacrum Mid Unstageable - Full thickness tissue loss in which the base of the injury is covered by slough (yellow, tan, gray, green or brown) and/or eschar (tan, brown or black) in the wound bed. changed from a stage 1 to unstagea (Active)  ?06/20/21 1300  ?Location: Sacrum  ?Location Orientation: Mid  ?Staging: Unstageable - Full thickness tissue loss in which the base of the injury is covered by slough (yellow, tan, gray, green or brown) and/or eschar (tan, brown or black) in the wound bed.  ?Wound Description (Comments): changed from a stage 1 to unstageable  ?Present on Admission: Yes  ? ? ?Physical Exam: ?Vital Signs ?Blood pressure 132/83, pulse 81, temperature 97.9 ?F (36.6 ?C), resp. rate 18, height 5\' 7"  (1.702 m), weight 64.9 kg, SpO2 94 %. ? ? ? ?General: awake, alert, appropriate, laying supine in bed; NAD, BMI 22.41 ?HENT: eyes closed to prevent HA/dizziness ?CV: regular rate; no JVD ?Pulmonary: CTA B/L; no W/R/R- good air movement ?GI: soft, NT, ND, (+)BS ?Psychiatric: appropriate, flat ?Skin: IV removed ?Neurological:  ?   Comments: Patient is alert and makes eye contact with examiner.  Follows simple commands.  Provides name and age.  Fair awareness of deficits. 5/5 strength  throughout. Sensation intact throughout. ?MSK: doing flexion and extension neck exercises.  ? ? ?Assessment/Plan: ?1. Functional deficits which require 3+ hours per day of interdisciplinary therapy in a comprehensive inpatient rehab setting. ?Physiatrist is providing close team supervision and 24 hour management of active medical problems listed below. ?Physiatrist and rehab team continue to assess barriers to discharge/monitor patient progress toward functional and medical goals ? ?Care Tool: ? ?Bathing ?   ?Body parts bathed by patient: Right arm, Left arm, Chest, Abdomen, Right upper leg, Left upper leg, Face, Front perineal area, Buttocks, Right lower leg, Left lower leg  ? Body parts bathed by helper: Right lower leg, Left lower leg, Buttocks, Front perineal area ?Body parts n/a: Right lower leg, Left lower leg ?  ?Bathing assist Assist Level: Contact Guard/Touching assist ?  ?  ?Upper Body Dressing/Undressing ?Upper body dressing   ?What is the patient wearing?: [place] only ?   ?Upper body assist Assist Level: Minimal Assistance - Patient > 75% ?   ?Lower Body Dressing/Undressing ?Lower body dressing ? ? ?   ?What is the patient wearing?: Incontinence brief, Pants ? ?  ? ?Lower body assist Assist for  lower body dressing: Minimal Assistance - Patient > 75% ?   ? ?Toileting ?Toileting    ?Toileting assist Assist for toileting: Contact Guard/Touching assist ?  ?  ?Transfers ?Chair/bed transfer ? ?Transfers assist ? Chair/bed transfer activity did not occur: Refused (dizziness) ? ?Chair/bed transfer assist level: Supervision/Verbal cueing ?  ?  ?Locomotion ?Ambulation ? ? ?Ambulation assist ? ? Ambulation activity did not occur: Refused (dizziness) ? ?Assist level: Supervision/Verbal cueing ?Assistive device: Walker-rolling ?Max distance: 18ft  ? ?Walk 10 feet activity ? ? ?Assist ? Walk 10 feet activity did not occur: Refused (dizziness) ? ?Assist level: Supervision/Verbal cueing ?Assistive device:  Walker-rolling  ? ?Walk 50 feet activity ? ? ?Assist Walk 50 feet with 2 turns activity did not occur: Refused (dizziness) ? ?Assist level: Supervision/Verbal cueing ?Assistive device: Walker-rolling  ? ? ?Walk 150 feet activity ? ? ?Assist Walk 150 feet activity did not occur: Refused (dizziness) ? ?  ?  ?  ? ?Walk 10 feet on uneven surface  ?activity ? ? ?Assist Walk 10 feet on uneven surfaces activity did not occur: Refused (dizziness) ? ? ?Assist level: Supervision/Verbal cueing ?   ? ?Wheelchair ? ? ? ? ?Assist Is the patient using a wheelchair?: Yes ?Type of Wheelchair: Manual ?Wheelchair activity did not occur: Refused (dizziness) ? ?  ?   ? ? ?Wheelchair 50 feet with 2 turns activity ? ? ? ?Assist ? ?  ?Wheelchair 50 feet with 2 turns activity did not occur: Refused (dizziness) ? ? ?   ? ?Wheelchair 150 feet activity  ? ? ? ?Assist ? Wheelchair 150 feet activity did not occur: Refused (dizziness) ? ? ?   ? ?Blood pressure 132/83, pulse 81, temperature 97.9 ?F (36.6 ?C), resp. rate 18, height 5\' 7"  (1.702 m), weight 64.9 kg, SpO2 94 %. ? ?Medical Problem List and Plan: ?1. 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            -patient may shower ?            -ELOS/Goals: 7-8 days S ?            Continue CIR- PT, OT and SLP ? HFU scheduled ?2.  Findings of pulmonary embolism RLL without heart strain.  Transitioned to Eliquis.  ?3. Headache: Fioricet as needed headache, tramadol as needed.  Completing Medrol Dosepak for headaches. Increase Topamax to 75mg  HS.  ?4. Mood: Provide emotional support ?            -antipsychotic agents: N/A ?5. Neuropsych: This patient is capable of making decisions on his own behalf. ?6. Skin/Wound Care: Routine skin checks ?7. Fluids/Electrolytes/Nutrition: Routine in and outs with follow-up chemistries ?8.  Pulmonary mass with hilar adenopathy.  Status post bronchoscopy 06/17/2021.  Path report pending.  Follow-up outpatient oncology ?9.  Uncontrolled  diabetes mellitus.  Hemoglobin A1c 13.6.. ?NovoLog 6 units 3 times daily with meals.  Diabetic teaching. CBGs 97-232: Increase Semglee to 32U and d/c ISS.  ?10.  Hypertension. Continue Cozaar 50 mg daily, Norvasc 10 mg daily.  Monitor with increased mobility ?11.  Hyperlipidemia.  Continue Lipitor ?12. Suboptimal vitamin D: start ergocalciferol 50,000 once per week for 7 weeks ?13. History of suboptimal magnesium: normalized ?14. Tachycardia: start Toprol-XL 12.5mg  daily.  ?15. Leukocytosis: stable, continue to montior ? 4/22- likely due to steroids-  ? 4/23- labs today- WBC is normalized- con't to monitor ? ? ?LOS: ?6 days ?A FACE TO FACE EVALUATION WAS PERFORMED ? ?  Martha Clan P Aryani Daffern ?06/26/2021, 10:58 AM  ? ?  ?

## 2021-06-26 NOTE — Progress Notes (Signed)
Occupational Therapy Discharge Summary ? ?Patient Details  ?Name: Micheal Donovan ?MRN: [id number] ?Date of Birth: November 24, 1950 ? ? ?Patient has met 9 of 9 long term goals due to improved activity tolerance, improved balance, ability to compensate for deficits, improved awareness, and improved coordination.  Patient to discharge at overall Modified Independent level for self-care and supervision for toilet/shower transfers. Pt reports he will have intermittent supervision provided by daughter. However, pt's wife is currently in the ICU and will not be available to provide any assist upon discharge. Family education not completed due to pt's request to discharge ASAP.  ? ?Reasons goals not met: n/a ? ?Recommendation:  ?Patient will benefit from ongoing skilled OT services in home health setting to continue to advance functional skills in the area of BADL and iADL. ? ?Equipment: ?Has all DME needed ? ?Reasons for discharge: treatment goals met and discharge from hospital ? ?Patient/family agrees with progress made and goals achieved: Yes ? ?OT Discharge ?Precautions/Restrictions  ?Precautions ?Precautions: Fall ?Precaution Comments: dizziness ?Restrictions ?Weight Bearing Restrictions: No ?ADL ?ADL ?Eating: Independent ?Where Assessed-Eating: Bed level ?Grooming: Modified independent ?Where Assessed-Grooming: Sitting at sink ?Upper Body Bathing: Modified independent ?Where Assessed-Upper Body Bathing: Shower ?Lower Body Bathing: Modified independent ?Where Assessed-Lower Body Bathing: Shower ?Upper Body Dressing: Modified independent (Device) ?Where Assessed-Upper Body Dressing: Edge of bed ?Lower Body Dressing: Modified independent ?Where Assessed-Lower Body Dressing: Edge of bed ?Toileting: Modified independent ?Where Assessed-Toileting: Bedside Commode, Toilet ?Toilet Transfer: Close supervision ?Toilet Transfer Method: Ambulating ?Science writer: Grab bars, Raised toilet seat ?Tub/Shower Transfer: Unable to  assess ?Tub/Shower Transfer Method: Unable to assess ?Walk-In Shower Transfer: Close supervision ?Walk-In Shower Transfer Method: Ambulating, Sit pivot ?Walk-In Shower Equipment: Radio broadcast assistant, Grab bars ?Vision ?Baseline Vision/History: 1 Wears glasses (readers only) ?Patient Visual Report: Eye fatigue/eye pain/headache;Nausea/blurring vision with head movement;Blurring of vision ?Perception  ?Perception: Within Functional Limits ?Praxis ?Praxis: Intact ?Cognition ?Cognition ?Overall Cognitive Status: Within Functional Limits for tasks assessed ?Arousal/Alertness: Lethargic ?Orientation Level: Person;Place;Situation ?Person: Oriented ?Place: Oriented ?Situation: Oriented ?Memory: Impaired ?Memory Impairment: Storage deficit;Retrieval deficit ?Brief Interview for Mental Status (BIMS) ?Repetition of Three Words (First Attempt): 3 ?Temporal Orientation: Year: Correct ?Temporal Orientation: Month: Accurate within 5 days ?Temporal Orientation: Day: Incorrect (Tuesday) ?Recall: "Sock": Yes, no cue required ?Recall: "Blue": Yes, no cue required ?Recall: "Bed": No, could not recall ?BIMS Summary Score: 12 ?Sensation ?Sensation ?Light Touch: Appears Intact ?Proprioception: Appears Intact ?Coordination ?Gross Motor Movements are Fluid and Coordinated: Yes ?Fine Motor Movements are Fluid and Coordinated: No ?Coordination and Movement Description: central vestibular dysfunction resulting in dizziness and altered balance strategies ?Finger Nose Finger Test: mild dysmetria bilaterally and slow ?Heel Shin Test: slow but ROM WFL ?Motor  ?Motor ?Motor: Within Functional Limits ?Motor - Skilled Clinical Observations: central vestibular dysfunction resulting in dizziness and altered balance strategies ?Mobility  ?Bed Mobility ?Bed Mobility: Rolling Right;Rolling Left;Sit to Supine;Supine to Sit ?Rolling Right: Independent with assistive device ?Rolling Left: Independent with assistive device ?Supine to Sit: Independent with  assistive device ?Sit to Supine: Independent with assistive device ?Transfers ?Sit to Stand: Independent with assistive device ?Stand to Sit: Independent with assistive device  ?Trunk/Postural Assessment  ?Cervical Assessment ?Cervical Assessment: Within Functional Limits ?Thoracic Assessment ?Thoracic Assessment: Within Functional Limits ?Lumbar Assessment ?Lumbar Assessment: Within Functional Limits ?Postural Control ?Postural Control: Within Functional Limits  ?Balance ?Balance ?Balance Assessed: Yes ?Static Sitting Balance ?Static Sitting - Balance Support: Feet supported;Bilateral upper extremity supported ?Static Sitting - Level of Assistance: 6: Modified independent (Device/Increase  time) ?Dynamic Sitting Balance ?Dynamic Sitting - Balance Support: Feet supported;No upper extremity supported ?Dynamic Sitting - Level of Assistance: 6: Modified independent (Device/Increase time) ?Static Standing Balance ?Static Standing - Balance Support: Bilateral upper extremity supported (RW) ?Static Standing - Level of Assistance: 6: Modified independent (Device/Increase time) ?Dynamic Standing Balance ?Dynamic Standing - Balance Support: Bilateral upper extremity supported (RW) ?Dynamic Standing - Level of Assistance: 6: Modified independent (Device/Increase time) ?Dynamic Standing - Comments: with transfers only ?Extremity/Trunk Assessment ?RUE Assessment ?RUE Assessment: Within Functional Limits ?LUE Assessment ?LUE Assessment: Within Functional Limits ? ? [name] ?06/26/2021, 11:46 AM ?

## 2021-06-26 NOTE — Patient Care Conference (Signed)
Inpatient RehabilitationTeam Conference and Plan of Care Update ?Date: 06/26/2021   Time: 11:48 AM  ? ? ?Patient Name: Micheal Donovan      ?Medical Record Number: [id number]  ?Date of Birth: July 01, 1950 ?Sex: Male         ?Room/Bed: 4W06C/4W06C-01 ?Payor Info: Payor: [employer] / Plan: [employer] PPO / Product Type: *No Product type* /   ? ?Admit Date/Time:  06/20/2021 12:44 PM ? ?Primary Diagnosis:  Intraparenchymal hemorrhage of brain (HCC) ? ?Hospital Problems: Principal Problem: ?  Intraparenchymal hemorrhage of brain ([place]) ?Active Problems: ?  Pressure injury of skin ? ? ? ?Expected Discharge Date: Expected Discharge Date: 06/27/21 ? ?Team Members Present: ?Physician leading conference: Dr. Leeroy Cha ?Social Worker Present: Erlene Quan, BSW ?Nurse Present: Dorthula Nettles, RN ?PT Present: Becky Sax, PT ?OT Present: Other (comment) [facility] Augusto Garbe, [place]) ?PPS Coordinator present : Gunnar Fusi, SLP ? ?   Current Status/Progress Goal Weekly Team Focus  ?Bowel/Bladder ? ? incontinent of bowel & bladder at times, LBM 06/24/21  less incontinent episodes  continue to monitor & encourage   ?Swallow/Nutrition/ Hydration ? ?           ?ADL's ? ? Min A LB self-care 2/2 dizziness with leans forward, Min A toileting, supervision ambulatory transfers with RW  Mod I  functional endurance, shower level self-care, vestibular management via habituation treatment, d/c planning   ?Mobility ? ? bed mobility mod I using bed features, transfers with RW close supervision, gait 145ft with RW close supervision  mod I  functional mobility/transfers, OOB mobility, partipcation, habituation exercises, dynamic standing balance, gait training, and D/C planning   ?Communication ? ?           ?Safety/Cognition/ Behavioral Observations ?           ?Pain ? ? always has a headache, has fioricet, tramadol, topimax & tylenol that is being alternating daily, pain scale between 4-7/10  pain scale <5/10  assess & treat as  needed   ?Skin ? ? small unstageable pressure injury to the sacral/coccyx region, has yellow necrosis  no new pressure injuries, healed pressure injury  assess skin q shift   ? ? ?Discharge Planning:  ?Discharging home with spouse and daughter able to provide sup/Min G with mobility and MIN A with ADLS able to provide 12/2   ?Team Discussion: ?Transitioned to Eliquis, increased Topamax, and Insulin to 30 units. Needs family education. Incontinent B/B, LBM 4/24. Reported pain 4/10 with headache. Stage 1 to sacrum and right heel, foam dressings applied. Has all equipment needed. Discharging home with spouse. Has dizziness with therapy at times. Recommending HH-PT/OT. ? ?Patient on target to meet rehab goals: ?yes, mod I goals have been met.  ? ?*See Care Plan and progress notes for long and short-term goals.  ? ?Revisions to Treatment Plan:  ?Finalizing discharge medications and plans. ?  ?Teaching Needs: ?Family education complete. ?  ?Current Barriers to Discharge: ?Decreased caregiver support, Home enviroment access/layout, Incontinence, Wound care, and Lack of/limited family support ? ?Possible Resolutions to Barriers: ?Family education ?PT/OT HH follow-up ?  ? ? Medical Summary ?Current Status: incontinent bowel and bladder, headache, stage 1 to sacrum and right heel, severely uncontrolled type 2 DM ? Barriers to Discharge: Medical stability;Wound care ? Barriers to Discharge Comments: incontinent bowel and bladder, headache, stage 1 to sacrum and right heel, severely uncontrolled type 2 DM ?Possible Resolutions to [employer] Focus: bowel and bladder program, increase topamax to $RemoveBe'75mg'[id number]$ , continue daily wound  care, send test strips for glucometer, increase Semglee to 32U ? ? ?Continued Need for Acute Rehabilitation Level of Care: The patient requires daily medical management by a physician with specialized training in physical medicine and rehabilitation for the following reasons: ?Direction of a  multidisciplinary physical rehabilitation program to maximize functional independence : Yes ?Medical management of patient stability for increased activity during participation in an intensive rehabilitation regime.: Yes ?Analysis of laboratory values and/or radiology reports with any subsequent need for medication adjustment and/or medical intervention. : Yes ? ? ?I attest that I was present, lead the team conference, and concur with the assessment and plan of the team. ? ? ?Dorthula Nettles G ?06/26/2021, 1:45 PM  ? ? ? ? ? ? ?

## 2021-06-26 NOTE — Discharge Summary (Signed)
Physician Discharge Summary  ?Patient ID: ?Micheal Donovan ?MRN: [id number] ?DOB/AGE: 07/03/1950 71 y.o. ? ?Admit date: 06/20/2021 ?Discharge date: 06/27/2021 ? ?Discharge Diagnoses:  ?Principal Problem: ?  Intraparenchymal hemorrhage of brain ([place]) ?Active Problems: ?  Pressure injury of skin ?Pulmonary mass with hilar adenopathy ?Uncontrolled diabetes mellitus ?Hypertension ?Hyperlipidemia ?Medical noncompliance ?Pulmonary emboli ? ?Discharged Condition: Stable ? ?Significant Diagnostic Studies: ?CT ANGIO HEAD NECK W WO CM ? ?Result Date: 06/12/2021 ?CLINICAL DATA:  Follow-up examination for hemorrhagic stroke. EXAM: CT ANGIOGRAPHY HEAD AND NECK TECHNIQUE: Multidetector CT imaging of the head and neck was performed using the standard protocol during bolus administration of intravenous contrast. Multiplanar CT image reconstructions and MIPs were obtained to evaluate the vascular anatomy. Carotid stenosis measurements (when applicable) are obtained utilizing NASCET criteria, using the distal internal carotid diameter as the denominator. RADIATION DOSE REDUCTION: This exam was performed according to the departmental dose-optimization program which includes automated exposure control, adjustment of the mA and/or kV according to patient size and/or use of iterative reconstruction technique. CONTRAST:  54mL OMNIPAQUE IOHEXOL 350 MG/ML SOLN COMPARISON:  Prior CT from earlier the same day. FINDINGS: CTA NECK FINDINGS Aortic arch: Visualized aortic arch normal caliber with normal branch pattern. Moderate atheromatous change about the arch and origin of the great vessels without high-grade stenosis. Right carotid system: Right CCA patent from its origin to the bifurcation without stenosis. Atheromatous change about the right carotid bulb/proximal right ICA without significant stenosis. Right ICA patent distally without stenosis or dissection. Left carotid system: Left CCA patent from its origin to the bifurcation without  significant stenosis. Bulky calcified plaque about the left carotid bulb/proximal cervical left ICA with associated stenosis of up to 75% by NASCET criteria. Left ICA tortuous but widely patent distally without stenosis or dissection. Vertebral arteries: Both vertebral arteries arise from subclavian arteries. No proximal subclavian artery stenosis. Right vertebral artery slightly dominant. Moderate approximate 50% stenosis noted at the origin of the left vertebral artery. Vertebral arteries patent distally without stenosis or dissection. Skeleton: Prior fusion at C5-6 with solid arthrodesis. Underlying moderate multilevel cervical spondylosis. Other neck: No other acute soft tissue abnormality within the neck. Few small thyroid nodules noted, largest of which measures 6 mm at the right thyroid lobe, of doubtful significance given size and patient age, no follow-up imaging recommended (ref: J Am Coll Radiol. 2015 Feb;12(2): 143-50). Upper chest: Patchy consolidative opacity partially visualize within the posterior left upper lobe, suspicious for pneumonia. Underlying emphysema. Review of the MIP images confirms the above findings CTA HEAD FINDINGS Anterior circulation: Petrous segments patent bilaterally. Mild atheromatous irregularity within the carotid siphons without significant stenosis. A1 segments patent bilaterally. Left A1 dominant. Normal anterior communicating artery complex. Anterior cerebral arteries patent without significant stenosis. No M1 stenosis or occlusion. No proximal MCA branch occlusion. Distal MCA branches perfused and symmetric. Posterior circulation: Moderate atheromatous stenosis noted involving the dominant mid right V4 segment (series 7, image 150). Right PICA patent. Left vertebral artery patent as it courses into the cranial vault. Left PICA patent. Severe atheromatous irregularity throughout the left V4 segment distal to the takeoff of the left PICA with associated severe multifocal  stenoses. The left vertebral artery is markedly attenuated but remains grossly patent to the vertebrobasilar junction. 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 Basilar irregular but patent to its distal aspect without high-grade stenosis. Superior cerebellar arteries patent bilaterally.  Right PCA supplied via the basilar as well as a prominent right posterior periphery. Predominant fetal type origin of the left PCA. Both PCAs patent to their distal aspects without high-grade stenosis. Venous sinuses: Grossly patent allowing for timing the contrast bolus. Anatomic variants: As above. No other vascular abnormality seen underlying the left cerebellar hemorrhage. Review of the MIP images confirms the above findings IMPRESSION: 1. Negative CTA for large vessel occlusion. 2. Bulky calcified plaque about the left carotid bulb/proximal cervical left ICA with associated stenosis of up to 75% by NASCET criteria. 3. Moderate approximate 50% stenosis at the origin of the left vertebral artery. 4. Severe atheromatous irregularity throughout the left V4 segment, with associated severe multifocal stenoses. 5. 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 Finding is suspected to be incidental in nature, and unlikely to be the source of the left cerebellar hemorrhage. No other vascular abnormality seen underlying the left cerebellar bleed. 6. Moderate atheromatous stenosis involving the dominant mid right V4 segment. 7. Patchy consolidative left upper lobe opacity, suspicious for pneumonia. 8. Emphysema (ICD10-J43.9). Electronically Signed   By: Jeannine Boga M.D.   On: 06/12/2021 01:10  ? ?CT HEAD WO CONTRAST (5MM) ? ?Result Date: 06/19/2021 ?CLINICAL DATA:  LEFT cerebellar hemorrhage. EXAM: CT HEAD WITHOUT CONTRAST TECHNIQUE: Contiguous axial images were obtained from the base of the skull through the vertex  without intravenous contrast. RADIATION DOSE REDUCTION: This exam was performed according to the departmental dose-optimization program which includes automated exposure control, adjustment of the mA and/or kV according to patient size and/or use of iterative reconstruction technique. COMPARISON:  CT 06/11/2021, MRI 06/12/2021 FINDINGS: Brain: Again demonstrated intraparenchymal hemorrhage within the LEFT cerebellum. There is effacement of fourth ventricle not changed from prior. Mild vasogenic edema towards the cerebral peduncles. No increase in volume of the hematoma measuring approximately 3.5 x 2.3 cm compared with 3.8 by 2.1 cm. No hydrocephalus. Third ventricle and lateral ventricles unchanged in volume. No intraparenchymal extra-axial hemorrhage elsewhere. Vascular: No hyperdense vessel or unexpected calcification. Skull: Normal. Negative for fracture or focal lesion. Sinuses/Orbits: Fluid within the maxillary sinuses is increased in the interval. Other: IMPRESSION: 1. Slight contraction of the LEFT cerebellar intraparenchymal hemorrhage. 2. Persistent effacement of the fourth ventricle with no hydrocephalus identified. No change in ventricular volume compared to prior. 3. No new intraparenchymal or extra-axial hemorrhage. 4. Maxillary sinusitis. Electronically Signed   By: Suzy Bouchard M.D.   On: 06/19/2021 18:21  ? ?CT HEAD WO CONTRAST (5MM) ? ?Result Date: 06/11/2021 ?CLINICAL DATA:  Follow-up examination for subdural hemorrhage. EXAM: CT HEAD WITHOUT CONTRAST TECHNIQUE: Contiguous axial images were obtained from the base of the skull through the vertex without intravenous contrast. RADIATION DOSE REDUCTION: This exam was performed according to the departmental dose-optimization program which includes automated exposure control, adjustment of the mA and/or kV according to patient size and/or use of iterative reconstruction technique. COMPARISON:  Prior CT performed earlier the same day. FINDINGS: Brain:  Previously identified intraparenchymal hemorrhage positioned at the left cerebellum again seen, not significantly changed in size or morphology as compared to previous. Surrounding vasogenic edema with regional ma

## 2021-06-27 ENCOUNTER — Other Ambulatory Visit: Payer: Self-pay | Admitting: *Deleted

## 2021-06-27 ENCOUNTER — Telehealth (HOSPITAL_BASED_OUTPATIENT_CLINIC_OR_DEPARTMENT_OTHER): Payer: PPO | Admitting: *Deleted

## 2021-06-27 ENCOUNTER — Other Ambulatory Visit: Payer: Self-pay | Admitting: Radiation Therapy

## 2021-06-27 DIAGNOSIS — R918 Other nonspecific abnormal finding of lung field: Secondary | ICD-10-CM | POA: Diagnosis not present

## 2021-06-27 LAB — CBC
HCT: 44 % (ref 39.0–52.0)
Hemoglobin: 14.8 g/dL (ref 13.0–17.0)
MCH: 28.9 pg (ref 26.0–34.0)
MCHC: 33.6 g/dL (ref 30.0–36.0)
MCV: 85.9 fL (ref 80.0–100.0)
Platelets: 327 10*3/uL (ref 150–400)
RBC: 5.12 MIL/uL (ref 4.22–5.81)
RDW: 13.1 % (ref 11.5–15.5)
WBC: 11.1 10*3/uL — ABNORMAL HIGH (ref 4.0–10.5)
nRBC: 0 % (ref 0.0–0.2)

## 2021-06-27 LAB — GLUCOSE, CAPILLARY: Glucose-Capillary: 157 mg/dL — ABNORMAL HIGH (ref 70–99)

## 2021-06-27 MED ORDER — TOPIRAMATE 25 MG PO TABS: 100.0000 mg | ORAL_TABLET | Freq: Every day | ORAL | Status: DC

## 2021-06-27 MED ORDER — NOVOLOG FLEXPEN 100 UNIT/ML ~~LOC~~ SOPN
6.0000 [IU] | PEN_INJECTOR | Freq: Three times a day (TID) | SUBCUTANEOUS | 11 refills | Status: DC
Start: 2021-06-27 — End: 2021-08-23

## 2021-06-27 MED ORDER — TOPIRAMATE 100 MG PO TABS: 100.0000 mg | ORAL_TABLET | Freq: Every day | ORAL | 0 refills | Status: DC

## 2021-06-27 NOTE — Progress Notes (Signed)
Inpatient Rehabilitation Care Coordinator ?Discharge Note  ? ?Patient Details  ?Name: Micheal Donovan ?MRN: [id number] ?Date of Birth: Jan 28, 1951 ? ? ?Discharge location: Home ? ?Length of Stay: 7 Days ? ?Discharge activity level: Sup ? ?Home/community participation: spouse ? ?Patient response GB:EEFEOF Literacy - How often do you need to have someone help you when you read instructions, pamphlets, or other written material from your doctor or pharmacy?: Never ? ?Patient response HQ:RFXJOI Isolation - How often do you feel lonely or isolated from those around you?: Never ? ?Services provided included: SW, Pharmacy, CM, RN, TR, SLP, OT, PT, RD, MD ? ?Financial Services:  ?Charity fundraiser Utilized: Private Insurance ?HTA ? ?Choices offered to/list presented to: patient ? ?Follow-up services arranged:  ?Home Health ?Home Health Agency: TBD  ?  ?  ?  ? ?Patient response to transportation need: ?Is the patient able to respond to transportation needs?: Yes ?In the past 12 months, has lack of transportation kept you from medical appointments or from getting medications?: No ?In the past 12 months, has lack of transportation kept you from meetings, work, or from getting things needed for daily living?: No ? ? ? ?Comments (or additional information): ? ?Patient/Family verbalized understanding of follow-up arrangements:  Yes ? ?Individual responsible for coordination of the follow-up plan: spouse ? ?Confirmed correct DME delivered: Dyanne Iha 06/27/2021   ? ?Dyanne Iha ?

## 2021-06-27 NOTE — Progress Notes (Signed)
INPATIENT REHABILITATION DISCHARGE NOTE ? ? ?Discharge instructions by:Dan PA ? ?Verbalized understanding:daughter and patient ? ?Skin care/Wound care healing? Yes. Measurement updated ; epithelized and dressing changed  ? ?Pain:none ? ?IV's:none ? ?Tubes/Drains:none ? ?O2:none ? ?Safety instructions:done ? ?Patient belongings:done ? ?Discharged KR:[id number] ? ?Discharged [id number] ? ?Notes: Educated patient and daughter use of insulin pen. ?Educated on signs and symptoms of low blood sugar. ?Educated daughter on accessing my chart ?Educated patient on meal planning ?Educated patient not to be on his back at all times to promote wound healing. ? ? ? ? ? ? ?  ?

## 2021-06-27 NOTE — Progress Notes (Signed)
Patient ID: Micheal Donovan, male   DOB: 1950-10-06, 71 y.o.   MRN: [id number] ? ?[facility] referral sent to [facility] ?

## 2021-06-27 NOTE — Progress Notes (Signed)
Patient approved by Latricia Heft for Pt/Ot/Slp ?

## 2021-06-27 NOTE — Progress Notes (Signed)
?  Radiation Oncology         (336) [phone] ?________________________________ ? ?Name: Micheal Donovan MRN: [id number]  ?Date: 06/20/2021  DOB: 06/12/1950 ? ?Chart Note: ? ?We discussed this patient in [facility] conference today.  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  He also presented with a cerebellar hemorrhage.  The cerebellar imaging does not suggest an underlying metastasis, but, short interval follow-up was recommended to help exclude brain metastasis.  I have asked that his case be reviewed in our brain tumor conference next Monday. ? ?________________________________ ? ?Sheral Apley Tammi Klippel, M.D. ? ? ? ?

## 2021-06-27 NOTE — Progress Notes (Signed)
Inpatient Rehabilitation Discharge Medication Review by a Pharmacist ? ?A complete drug regimen review was completed for this patient to identify any potential clinically significant medication issues. ? ?High Risk Drug Classes Is patient taking? Indication by Medication  ?Antipsychotic No   ?Anticoagulant Yes Apixaban- PE  ?Antibiotic No   ?Opioid No   ?Antiplatelet No   ?Hypoglycemics/insulin Yes Novolog, Lantus- T2DM  ?Vasoactive Medication Yes Norvasc, Losartan, Toprol XL- hypertension  ?Chemotherapy No   ?Other Yes Lipitor- HLD ?Fioricet- HA ?Claritin- seasonal allergies ?Dulera- COPD ?Protonix- GERD ?Topamax- HA prophylaxis  ? ? ? ?Type of Medication Issue Identified Description of Issue Recommendation(s)  ?Drug Interaction(s) (clinically significant) ?    ?Duplicate Therapy ?    ?Allergy ?    ?No Medication Administration End Date ?    ?Incorrect Dose ?    ?Additional Drug Therapy Needed ?    ?Significant med changes from prior encounter (inform family/care partners about these prior to discharge).    ?Other ?    ? ? ?Clinically significant medication issues were identified that warrant physician communication and completion of prescribed/recommended actions by midnight of the next day:  No ? ?Time spent performing this drug regimen review (minutes):  30 ? ? ?Johniya Durfee BS, PharmD, BCPS ?Clinical Pharmacist ?06/27/2021 7:17 AM ? ?Contact: [phone] after 3 PM ? ?"Be curious, not judgmental..." -Jamal Maes ?

## 2021-06-27 NOTE — Progress Notes (Signed)
The proposed treatment discussed in cancer conference is for discussion purpose only and is not a binding recommendation. The patient was not physically examined nor present for their treatment options. Therefore, final treatment plans cannot be decided.  ?

## 2021-06-27 NOTE — Progress Notes (Signed)
?                                                       PROGRESS NOTE ? ? ?Subjective/Complaints: ?Discussed that CBGs are much better controlled ?He feels ready for d/c today ?His daughter is on the way.  ? ?ROS:  ? ?Pt denies SOB, abd pain, CP, N/V/C/D, and vision changes, +headache ? ?Objective: ?  ?No results found. ? ?Recent Labs  ?  06/26/21 ?8676 06/27/21 ?1950  ?WBC 10.2 11.1*  ?HGB 13.5 14.8  ?HCT 40.6 44.0  ?PLT 299 327  ? ?No results for input(s): NA, K, CL, CO2, GLUCOSE, BUN, CREATININE, CALCIUM in the last 72 hours. ? ? ?Intake/Output Summary (Last 24 hours) at 06/27/2021 0924 ?Last data filed at 06/27/2021 9326 ?Gross per 24 hour  ?Intake 356 ml  ?Output 900 ml  ?Net -544 ml  ?  ? ?Pressure Injury 06/20/21 Sacrum Mid Unstageable - Full thickness tissue loss in which the base of the injury is covered by slough (yellow, tan, gray, green or brown) and/or eschar (tan, brown or black) in the wound bed. changed from a stage 1 to unstagea (Active)  ?06/20/21 1300  ?Location: Sacrum  ?Location Orientation: Mid  ?Staging: Unstageable - Full thickness tissue loss in which the base of the injury is covered by slough (yellow, tan, gray, green or brown) and/or eschar (tan, brown or black) in the wound bed.  ?Wound Description (Comments): changed from a stage 1 to unstageable  ?Present on Admission: Yes  ? ? ?Physical Exam: ?Vital Signs ?Blood pressure (!) 145/97, pulse 78, temperature 98 ?F (36.7 ?C), resp. rate 16, height 5\' 7"  (1.702 m), weight 64.9 kg, SpO2 97 %. ? ? ? ?General: awake, alert, appropriate, laying supine in bed; NAD, BMI 22.41 ?HENT: eyes closed to prevent HA/dizziness ?CV: regular rate; no JVD ?Pulmonary: CTA B/L; no W/R/R- good air movement ?GI: soft, NT, ND, (+)BS ?Psychiatric: appropriate, flat ?Skin: IV removed ?Neurological:  ?   Comments: Patient is alert and makes eye contact with examiner.  Follows simple commands.  Provides name and age.  Fair awareness of deficits. 5/5 strength  throughout. Sensation intact throughout. modI with mobility. ?MSK: doing flexion and extension neck exercises.  ? ? ?Assessment/Plan: ?1. Functional deficits which require 3+ hours per day of interdisciplinary therapy in a comprehensive inpatient rehab setting. ?Physiatrist is providing close team supervision and 24 hour management of active medical problems listed below. ?Physiatrist and rehab team continue to assess barriers to discharge/monitor patient progress toward functional and medical goals ? ?Care Tool: ? ?Bathing ?   ?Body parts bathed by patient: Right arm, Left arm, Chest, Abdomen, Right upper leg, Left upper leg, Face, Front perineal area, Buttocks, Right lower leg, Left lower leg  ? Body parts bathed by helper: Right lower leg, Left lower leg, Buttocks, Front perineal area ?Body parts n/a: Right lower leg, Left lower leg ?  ?Bathing assist Assist Level: Independent with assistive device ?  ?  ?Upper Body Dressing/Undressing ?Upper body dressing   ?What is the patient wearing?: Pull over shirt ?   ?Upper body assist Assist Level: Independent with assistive device ?   ?Lower Body Dressing/Undressing ?Lower body dressing ? ? ?   ?What is the patient wearing?: Incontinence brief, Pants ? ?  ? ?Lower body assist  Assist for lower body dressing: Independent with assitive device ?   ? ?Toileting ?Toileting    ?Toileting assist Assist for toileting: Independent with assistive device ?  ?  ?Transfers ?Chair/bed transfer ? ?Transfers assist ? Chair/bed transfer activity did not occur: Refused (dizziness) ? ?Chair/bed transfer assist level: Independent with assistive device ?Chair/bed transfer assistive device: Walker ?  ?Locomotion ?Ambulation ? ? ?Ambulation assist ? ? Ambulation activity did not occur: Refused (dizziness) ? ?Assist level: Supervision/Verbal cueing ?Assistive device: Walker-rolling ?Max distance: 166ft  ? ?Walk 10 feet activity ? ? ?Assist ? Walk 10 feet activity did not occur: Refused  (dizziness) ? ?Assist level: Supervision/Verbal cueing ?Assistive device: Walker-rolling  ? ?Walk 50 feet activity ? ? ?Assist Walk 50 feet with 2 turns activity did not occur: Refused (dizziness) ? ?Assist level: Supervision/Verbal cueing ?Assistive device: Walker-rolling  ? ? ?Walk 150 feet activity ? ? ?Assist Walk 150 feet activity did not occur: Safety/medical concerns (fatigue, dizziness) ? ?  ?  ?  ? ?Walk 10 feet on uneven surface  ?activity ? ? ?Assist Walk 10 feet on uneven surfaces activity did not occur: Refused (dizziness) ? ? ?Assist level: Supervision/Verbal cueing ?Assistive device: Walker-rolling  ? ?Wheelchair ? ? ? ? ?Assist Is the patient using a wheelchair?: No ?Type of Wheelchair: Manual ?Wheelchair activity did not occur: N/A ? ?  ?   ? ? ?Wheelchair 50 feet with 2 turns activity ? ? ? ?Assist ? ?  ?Wheelchair 50 feet with 2 turns activity did not occur: N/A ? ? ?   ? ?Wheelchair 150 feet activity  ? ? ? ?Assist ? Wheelchair 150 feet activity did not occur: N/A ? ? ?   ? ?Blood pressure (!) 145/97, pulse 78, temperature 98 ?F (36.7 ?C), resp. rate 16, height 5\' 7"  (1.702 m), weight 64.9 kg, SpO2 97 %. ? ?Medical Problem List and Plan: ?1. 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            -patient may shower ?            -ELOS/Goals: 7-8 days S ?            d/c home today ? HFU scheduled ?2.  Findings of pulmonary embolism RLL without heart strain.  Transitioned to Eliquis.  ?3. Headache: Fioricet as needed headache, tramadol as needed.  Completing Medrol Dosepak for headaches. Increase Topamax to 100mg  HS.  ?4. Mood: Provide emotional support ?            -antipsychotic agents: N/A ?5. Neuropsych: This patient is capable of making decisions on his own behalf. ?6. Skin/Wound Care: Routine skin checks ?7. Fluids/Electrolytes/Nutrition: Routine in and outs with follow-up chemistries ?8.  Pulmonary mass with hilar adenopathy.  Status post bronchoscopy 06/17/2021.  Path  report pending.  Follow-up outpatient oncology ?9.  Uncontrolled diabetes mellitus.  Hemoglobin A1c 13.6.. ?NovoLog 6 units 3 times daily with meals.  Diabetic teaching. CBGs 97-232: Increase Semglee to 33U and d/c ISS.  ?10.  Hypertension. Continue Cozaar 50 mg daily, Norvasc 10 mg daily.  Monitor with increased mobility ?11.  Hyperlipidemia.  Continue Lipitor ?12. Suboptimal vitamin D: start ergocalciferol 50,000 once per week for 7 weeks ?13. History of suboptimal magnesium: normalized ?14. Tachycardia: start Toprol-XL 12.5mg  daily.  ?15. Leukocytosis: stable, continue to montior ? 4/22- likely due to steroids-  ? 4/23- labs today- WBC is normalized- con't to monitor ? ? >30 minutes spent in discharge of patient  including review of medications and follow-up appointments, physical examination, and in answering all patient's questions  ? ? ?LOS: ?7 days ?A FACE TO FACE EVALUATION WAS PERFORMED ? ?Martha Clan P Janeene Sand ?06/27/2021, 9:24 AM  ? ?  ?

## 2021-06-28 ENCOUNTER — Telehealth: Payer: Self-pay

## 2021-06-28 NOTE — Telephone Encounter (Signed)
Transition Care Management Follow-up Telephone Call ?Date of discharge and from where: 06/27/2021 [facility] ?How have you been since you were released from the hospital? Pt states he is still having some issues with dizziness but otherwise doing well. ?Any questions or concerns? No ? ?Items Reviewed: ?Did the pt receive and understand the discharge instructions provided? Yes  ?Medications obtained and verified? Yes  ?Other? No  ?Any new allergies since your discharge? No  ?Dietary orders reviewed? No ?Do you have support at home? Yes  ? ?Home Care and Equipment/Supplies: ?Were home health services ordered? no ?If so, what is the name of the agency? N  ?Has the agency set up a time to come to the patient's home? not applicable ?Were any new equipment or medical supplies ordered?  No ?What is the name of the medical supply agency? N/A ?Were you able to get the supplies/equipment? not applicable ?Do you have any questions related to the use of the equipment or supplies? No ? ?Functional Questionnaire: (I = Independent and D = Dependent) ?ADLs: I ? ?Bathing/Dressing- I ? ?Meal Prep- I ? ?Eating- I ? ?Maintaining continence- I ? ?Transferring/Ambulation- I ? ?Managing Meds- I ? ?Follow up appointments reviewed: ? ?PCP Hospital f/u appt confirmed? Yes  Scheduled to see DR. PICKARD on 07/11/21 @ 2. ?[facility] f/u appt confirmed? Yes  Scheduled to see DR. MOHAMED on 07/04/2021 @ 2:45. ?Are transportation arrangements needed? No  ?If their condition worsens, is the pt aware to call PCP or go to the Emergency Dept.? Yes ?Was the patient provided with contact information for the PCP's office or ED? Yes ?Was to pt encouraged to call back with questions or concerns? Yes ? ?

## 2021-07-01 ENCOUNTER — Other Ambulatory Visit: Payer: Self-pay | Admitting: Radiation Therapy

## 2021-07-01 ENCOUNTER — Encounter: Payer: Self-pay | Admitting: Radiation Oncology

## 2021-07-01 ENCOUNTER — Inpatient Hospital Stay: Payer: PPO | Attending: Radiation Oncology

## 2021-07-01 ENCOUNTER — Telehealth: Payer: Self-pay | Admitting: Radiation Therapy

## 2021-07-01 DIAGNOSIS — E876 Hypokalemia: Secondary | ICD-10-CM | POA: Insufficient documentation

## 2021-07-01 DIAGNOSIS — R9089 Other abnormal findings on diagnostic imaging of central nervous system: Secondary | ICD-10-CM

## 2021-07-01 DIAGNOSIS — R519 Headache, unspecified: Secondary | ICD-10-CM | POA: Insufficient documentation

## 2021-07-01 DIAGNOSIS — C3412 Malignant neoplasm of upper lobe, left bronchus or lung: Secondary | ICD-10-CM | POA: Insufficient documentation

## 2021-07-01 DIAGNOSIS — Z86718 Personal history of other venous thrombosis and embolism: Secondary | ICD-10-CM | POA: Insufficient documentation

## 2021-07-01 DIAGNOSIS — R630 Anorexia: Secondary | ICD-10-CM | POA: Insufficient documentation

## 2021-07-01 DIAGNOSIS — Z7901 Long term (current) use of anticoagulants: Secondary | ICD-10-CM | POA: Insufficient documentation

## 2021-07-01 DIAGNOSIS — Z79899 Other long term (current) drug therapy: Secondary | ICD-10-CM | POA: Insufficient documentation

## 2021-07-01 NOTE — Progress Notes (Signed)
?  Radiation Oncology         [phone]) [phone] ?________________________________ ? ?Name: Micheal Donovan MRN: [id number]  ?Date: 07/01/2021  DOB: 1950/06/27 ? ?Chart Note: ? ?We discussed this patient in our multidisciplinary brain tumor conference earlier today.  Reviewed his brain imaging.  He has a cerebellar hemorrhage and the imaging does not seem to elucidate any underlying malignant lesion.  However, short interval follow-up MRI was suggested.  In reviewing the case today with neuroradiology, neuro-oncology, and neurosurgery, the consensus recommendation was to proceed with a 3 Tesla MRI using 1 mm slices in mid June to better delineate the evolving hemorrhage site and also to rule out other smaller lesions elsewhere in the brain.  As such, the consensus was to proceed with definitive treatment of the extracranial disease presuming no brain metastasis at this time. ? ?________________________________ ? ?Sheral Apley Tammi Klippel, M.D. ? ? ? ?

## 2021-07-01 NOTE — Telephone Encounter (Signed)
I called to inform pt of an available consult appointment with Dr. Tammi Klippel on Thursday 5/4, he is not able to make it in before 10:00 and has asked that I change this to later in the day.  ? ?Mont Dutton R.T.(R)(T) ?Radiation Special Procedures Navigator  ?

## 2021-07-02 ENCOUNTER — Telehealth: Payer: Self-pay | Admitting: Radiation Therapy

## 2021-07-02 ENCOUNTER — Telehealth: Payer: Self-pay

## 2021-07-02 ENCOUNTER — Other Ambulatory Visit: Payer: Self-pay

## 2021-07-02 DIAGNOSIS — Z7901 Long term (current) use of anticoagulants: Secondary | ICD-10-CM | POA: Diagnosis not present

## 2021-07-02 DIAGNOSIS — Z794 Long term (current) use of insulin: Secondary | ICD-10-CM | POA: Diagnosis not present

## 2021-07-02 DIAGNOSIS — R918 Other nonspecific abnormal finding of lung field: Secondary | ICD-10-CM | POA: Diagnosis not present

## 2021-07-02 DIAGNOSIS — E785 Hyperlipidemia, unspecified: Secondary | ICD-10-CM | POA: Diagnosis not present

## 2021-07-02 DIAGNOSIS — E1165 Type 2 diabetes mellitus with hyperglycemia: Secondary | ICD-10-CM

## 2021-07-02 DIAGNOSIS — E1169 Type 2 diabetes mellitus with other specified complication: Secondary | ICD-10-CM | POA: Insufficient documentation

## 2021-07-02 DIAGNOSIS — I614 Nontraumatic intracerebral hemorrhage in cerebellum: Secondary | ICD-10-CM | POA: Diagnosis not present

## 2021-07-02 DIAGNOSIS — L89150 Pressure ulcer of sacral region, unstageable: Secondary | ICD-10-CM | POA: Diagnosis not present

## 2021-07-02 DIAGNOSIS — E119 Type 2 diabetes mellitus without complications: Secondary | ICD-10-CM | POA: Insufficient documentation

## 2021-07-02 DIAGNOSIS — I2699 Other pulmonary embolism without acute cor pulmonale: Secondary | ICD-10-CM | POA: Diagnosis not present

## 2021-07-02 DIAGNOSIS — L89611 Pressure ulcer of right heel, stage 1: Secondary | ICD-10-CM | POA: Diagnosis not present

## 2021-07-02 DIAGNOSIS — I6529 Occlusion and stenosis of unspecified carotid artery: Secondary | ICD-10-CM | POA: Diagnosis not present

## 2021-07-02 NOTE — Progress Notes (Incomplete)
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 ?Associated Dx:  Left Cerebellar Hemorrhage ? ?CT Head Neck wo contrast (5MM) 06/11/2021 ?IMPRESSION: ?1. No significant interval change in size and morphology of left ?cerebellar intraparenchymal hemorrhage with similar regional mass ?effect. Stable partial effacement of the fourth ventricle without ?progressive hydrocephalus. ?2. No other new acute intracranial abnormality. ? ?CT Angio Head Neck wo contrast (23mm) 06/11/2021 ?IMPRESSION: ?1. Negative CTA for large vessel occlusion. ?2. Bulky calcified plaque about the left carotid bulb/proximal cervical left ICA with associated stenosis of up to 75% by NASCET criteria. ?3. Moderate approximate 50% stenosis at the origin of the left vertebral artery. ?4. Severe atheromatous irregularity throughout the left V4 segment, with associated severe multifocal stenoses. ?5. 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 Finding is suspected to be incidental in nature, and unlikely to be the source of the left cerebellar hemorrhage. No other vascular abnormality seen underlying the left cerebellar bleed. ?6. Moderate atheromatous stenosis involving the dominant mid right V4 segment. ?7. Patchy consolidative left upper lobe opacity, suspicious for pneumonia. ?8. Emphysema (ICD10-J43.9). ? ?DG Chest [facility] 06/12/2021 ?IMPRESSION: ?1. A 4.4 cm left mid lung pulmonary mass concerning for malignancy until proven otherwise. Recommend further evaluation with a CT of the chest with intravenous contrast. ? ?MR Brain  06/12/2021 ?IMPRESSION: ?No significant interval change in size of the left cerebellar intraparenchymal hematoma with unchanged regional mass effect and partial effacement of fourth ventricle but no upstream ?hydrocephalus. There is no discernible enhancement to suggest underlying mass lesion, though acute blood products could mask enhancement. Consider follow up MRI with  contrast in 2-3 months to reassess. ? ?CT Chest 06/13/2021 ?IMPRESSION: ?1. 4.3 x 3.6 x 3.3 cm lobular spiculated mass in the posterior left ?upper lobe with tethering to the lateral pleura and major fissure. ?Imaging features are consistent with primary bronchogenic neoplasm. ?2. Metastatic left hilar lymphadenopathy. ?3. Peripheral micro nodularity posterior right costophrenic sulcus, ?likely related to infectious/inflammatory etiology and/or ?aspiration. ?4. Aortic Atherosclerosis (ICD10-I70.0). ? ?CT Abdomen Pelvis 06/18/2021 ?IMPRESSION: ?1. Right lower lobe pulmonary artery embolus. No evidence of right ?heart straining. ?2. No acute intra-abdominal or pelvic pathology. ?3. Aortic Atherosclerosis (ICD10-I70.0). ? ?CT Head 06/19/2021 ?IMPRESSION: ?1. Slight contraction of the LEFT cerebellar intraparenchymal ?hemorrhage. ?2. Persistent effacement of the fourth ventricle with no ?hydrocephalus identified. No change in ventricular volume compared ?to prior. ?3. No new intraparenchymal or extra-axial hemorrhage. ?4. Maxillary sinusitis. ? ? ?Tobacco/Marijuana/Snuff/ETOH use: Quit smoking 07/21/1990, no smokeless tobacco, drug or alcohol use. ? ?Past/Anticipated interventions by cardiothoracic surgery, if any:  ? ?Past/Anticipated interventions by medical oncology, if any: NA ? ?Signs/Symptoms ?Weight changes, if any:  ?Respiratory complaints, if any:  ?Hemoptysis, if any:  ?Pain issues, if any:   ? ?Recent neurologic symptoms, if any:  ?Seizures:  ?Headaches:  ?Nausea:  ?Dizziness/ataxia:  ?Difficulty with hand coordination:  ?Focal numbness/weakness:  ?Visual deficits/changes:  ?Confusion/Memory deficits:  ? ?Painful bone metastases at present, if any:  ? ?SAFETY ISSUES: ?Prior radiation? {:18581} ?Pacemaker/ICD? {:18581}  ?Possible current pregnancy? Male ?Is the patient on methotrexate? No ? ?Current Complaints / other details:    ?

## 2021-07-02 NOTE — Telephone Encounter (Signed)
Spoke with Stacy,PT w/[facility]. PT would like verbal orders for PT: ?PT orders for pt are: 2 wk 3 and 1wk 1, will evaluate after that. ----verbal given,myself/BT, CMA ? ? ?would like to order a skill nursing care for pt to help manage medications. ---order placed 07/02/21 ? ?

## 2021-07-02 NOTE — Telephone Encounter (Signed)
Left a detailed message for the patient including my contact information and a request to call back and confirm his consult appointment with Dr. Tammi Klippel on Thursday 5/4.  ? ?Mont Dutton R.T.(R)(T) ?Radiation Special Procedures Navigator  ?

## 2021-07-03 NOTE — Progress Notes (Incomplete)
?Radiation Oncology         [phone] ?________________________________ ? ?Initial outpatient Consultation ? ?Name: Micheal Donovan MRN: [id number]  ?Date of Service: 07/04/2021 DOB: [birth year] ? ?RD:EYCXKGY, Cammie Mcgee, MD  Curt Bears, MD  ? ?REFERRING PHYSICIAN: Curt Bears, MD ? ?DIAGNOSIS: There were no encounter diagnoses. ? ?No diagnosis found. ? ?Oncology History Overview Note  ?Diagnosis: Stage IIB T2b N1 M0 adenocarcinoma of the LUL, poorly differentiated  ? ?Medical Oncologist: Julien Nordmann ?Radiation Oncologist: Tammi Klippel ?Pulmonologist: [place] ?  ?Lung cancer ([place])  ?06/11/2021 Imaging  ? CT Angio Head and Neck WWO: patchy consolidative LUL opacity, suspicious for pneumonia ?  ?06/12/2021 Imaging  ? Chest x-ray: 4.4 cm L midlung pulmonary mass concerning for malignancy  ?  ?06/13/2021 Imaging  ? CT Chest W: .3 x 3.6 x 3.3 cm lobular spiculated mass in the posterior L upper lobe with tethering to the lateral pleura and major fissure consistent with a primary bronchogenic neoplasm with associated metastatic L hilar lymphadenopathy ? ?MRI Brain WWO: known L cerebellar hematoma 4.6 x 3.5 x 2.7 cm without discernible enhancement to suggest underlying mass lesion or metastatic disease ?  ?06/17/2021 Initial Diagnosis  ? Presented with acute dizziness ?  ?06/17/2021 Initial Biopsy  ? Station 11L FNA: poorly differentiated non-small cell carcinoma, consistent with adenocarcinoma  ?  ?06/18/2021 Imaging  ? CT Abdomen Pelvis W: no metastatic disease in abdomen/pelvis  ?  ? ?HISTORY OF PRESENT ILLNESS: Micheal Donovan is a 71 y.o. male seen at the request of Dr. Julien Nordmann for discussion of a newly diagnosed NSCLC. He initially presented to an OSH on 06/10/21 with progressively worsening headache, dizziness and gait ataxia for 1 week. CT head showed acute to subacute intraparenchymal hemorrhage in the L cerebellum measuring 3.3 x 2.7 x 4.3 cm with surrounding low-density vasogenic edema with mild regional mass effect and  partial effacement of the fourth ventricle without hydrocephalus. He was transferred to [facility] for further work up and evaluated. Subsequent MRI on 06/12/21 showed no interval chance in size and no discernible enhancement to suggest underlying mass lesion. CT angio of the head and neck WWO incidentally noted a patchy consolidative LUL opacity, suspicious for pneumonia for which patient underwent chest x-ray. Unfortunately, that demonstrated a 4.4 cm L midlung pulmonary mass concerning for malignancy. 06/13/21 CT Chest W demonstrated a 4.3 x 3.6 x 3.3 cm lobular spiculated mass in the posterior L upper lobe with tethering to the lateral pleura and major fissure consistent with a primary bronchogenic neoplasm with associated metastatic L hilar lymphadenopathy. Pulmonary was consulted inpatient due to mass and patient underwent bronchoscopy on 06/17/21 with Dr. Ina Homes which showed no subcarinal adenopathy by EBUS. L hilar node sampled x6 and consistent with poorly differentiated adenocarcinoma. For further staging patient underwent CT abdomen/pelvis W on 06/18/21 without evidence of metastatic disease but there was evidence of a RLL pulmonary artery embolus without R heart strain for which patient initiated anticoagulation.  ? ?Patient discussed in [facility] conference and due to patient presentation with a cerebellar hemorrhage, he was referred for discussion in our multidisciplinary brain tumor conference. After discussion with neuroradiology, neuro-oncology and neurosurgery, the consensus after review of his brain imaging was that there does not seem to be any underlying malignant lesion and the consensus recommendation was to proceed with a 3T MRI with 1 mm slices in mid June to better delineate evolving hemorrhage site and to rule out other small lesions elsewhere in the brain.  ? ?  Current consensus is to proceed with definitive treatment of his T2b N1 M0 adenocarcinoma of the LUL for which he presents to [place]  today for discussion of radiation therapy. ? ?PREVIOUS RADIATION THERAPY: {EXAM; YES/NO:19492::"No"} ? ?PAST MEDICAL HISTORY:  ?Past Medical History:  ?Diagnosis Date  ? Allergy   ? BPH (benign prostatic hypertrophy)   ? Cancer [facility])   ? Melanoma on Neck    2008  ? Carotid artery occlusion   ? Diabetes mellitus   ? ED (erectile dysfunction)   ? Hyperlipidemia   ? Hypertension   ? Retinopathy due to secondary DM ([place])   ?   ? ?PAST SURGICAL HISTORY: ?Past Surgical History:  ?Procedure Laterality Date  ? BRONCHIAL NEEDLE ASPIRATION BIOPSY  06/17/2021  ? Procedure: BRONCHIAL NEEDLE ASPIRATION BIOPSIES;  Surgeon: Candee Furbish, MD;  Location: [facility] ENDOSCOPY;  Service: Pulmonary;;  ? MELANOMA EXCISION  2008  ? Left side of neck  ? RADIOLOGY WITH ANESTHESIA N/A 04/05/2020  ? Procedure: MRI SPINE WITOUT CONTRAST;  Surgeon: Radiologist, Medication, MD;  Location: [place];  Service: Radiology;  Laterality: N/A;  ? TONSILLECTOMY    ? VIDEO BRONCHOSCOPY WITH ENDOBRONCHIAL ULTRASOUND N/A 06/17/2021  ? Procedure: VIDEO BRONCHOSCOPY WITH ENDOBRONCHIAL ULTRASOUND;  Surgeon: Candee Furbish, MD;  Location[facility] ENDOSCOPY;  Service: Pulmonary;  Laterality: N/A;  ? ? ?FAMILY HISTORY:  ?Family History  ?Problem Relation Age of Onset  ? COPD Mother   ? Heart disease Father   ? Heart disease Brother   ?     MI at age 71  ? ? ?SOCIAL HISTORY:  ?Social History  ? ?Socioeconomic History  ? Marital status: Single  ?  Spouse name: Not on file  ? Number of children: Not on file  ? Years of education: Not on file  ? Highest education level: Not on file  ?Occupational History  ? Not on file  ?Tobacco Use  ? Smoking status: Former  ?  Types: Cigarettes  ?  Quit date: 07/21/1990  ?  Years since quitting: 30.9  ? Smokeless tobacco: Never  ?Vaping Use  ? Vaping Use: Never used  ?Substance and Sexual Activity  ? Alcohol use: No  ? Drug use: No  ? Sexual activity: Not on file  ?Other Topics Concern  ? Not on file  ?Social History Narrative  ? Not on file   ? ?Social Determinants of Health  ? ?Financial Resource Strain: Not on file  ?Food Insecurity: Not on file  ?Transportation Needs: Not on file  ?Physical Activity: Not on file  ?Stress: Not on file  ?Social Connections: Not on file  ?Intimate Partner Violence: Not on file  ? ? ?ALLERGIES: Niaspan [niacin] ? ?MEDICATIONS:  ?Current Outpatient Medications  ?Medication Sig Dispense Refill  ? acetaminophen (TYLENOL) 325 MG tablet Take 2 tablets (650 mg total) by mouth every 4 (four) hours as needed for mild pain (or temp > 37.5 C (99.5 F)).    ? amLODipine (NORVASC) 10 MG tablet Take 1 tablet (10 mg total) by mouth daily. 30 tablet 0  ? apixaban (ELIQUIS) 5 MG TABS tablet Take 1 tablet (5 mg total) by mouth 2 (two) times daily. 60 tablet 0  ? atorvastatin (LIPITOR) 40 MG tablet Take 1 tablet (40 mg total) by mouth daily. 30 tablet 0  ? butalbital-acetaminophen-caffeine (FIORICET) 50-325-40 MG tablet Take 1 tablet by mouth every 6 (six) hours as needed for headache. 10 tablet 0  ? guaiFENesin (MUCINEX) 600 MG 12 hr tablet  Take 2 tablets (1,200 mg total) by mouth 2 (two) times daily. 180 tablet 0  ? insulin aspart (NOVOLOG FLEXPEN) 100 UNIT/ML FlexPen Inject 6 Units into the skin 3 (three) times daily with meals. 15 mL 11  ? insulin glargine (LANTUS) 100 UNIT/ML Solostar Pen Inject 32 Units into the skin daily. 15 mL 11  ? loratadine (CLARITIN) 10 MG tablet Take 1 tablet (10 mg total) by mouth daily. 30 tablet 0  ? losartan (COZAAR) 50 MG tablet TAKE 1 TABLET(50 MG) BY MOUTH DAILY 30 tablet 0  ? meclizine (ANTIVERT) 12.5 MG tablet Take 1 tablet (12.5 mg total) by mouth 2 (two) times daily as needed for dizziness. 30 tablet 0  ? metoprolol succinate (TOPROL-XL) 25 MG 24 hr tablet Take 0.5 tablets (12.5 mg total) by mouth daily. 30 tablet 0  ? mometasone-formoterol (DULERA) 200-5 MCG/ACT AERO Inhale 2 puffs into the lungs 2 (two) times daily. 1 each 0  ? pantoprazole (PROTONIX) 40 MG tablet Take 1 tablet (40 mg total) by  mouth daily at 6 (six) AM. 30 tablet 0  ? senna-docusate (SENOKOT-S) 8.6-50 MG tablet Take 1 tablet by mouth 2 (two) times daily.    ? topiramate (TOPAMAX) 100 MG tablet Take 1 tablet (100 mg total) by mouth

## 2021-07-04 ENCOUNTER — Inpatient Hospital Stay: Payer: PPO

## 2021-07-04 ENCOUNTER — Ambulatory Visit
Admission: RE | Admit: 2021-07-04 | Discharge: 2021-07-04 | Disposition: A | Discharge status: Home / Self Care | Payer: PPO | Source: Ambulatory Visit | Attending: Radiation Oncology | Admitting: Radiation Oncology

## 2021-07-04 ENCOUNTER — Telehealth: Payer: Self-pay | Admitting: Internal Medicine

## 2021-07-04 ENCOUNTER — Ambulatory Visit: Payer: PPO

## 2021-07-04 ENCOUNTER — Inpatient Hospital Stay: Payer: PPO | Admitting: Internal Medicine

## 2021-07-04 ENCOUNTER — Telehealth: Payer: Self-pay | Admitting: Radiation Therapy

## 2021-07-04 NOTE — Telephone Encounter (Signed)
Pt had requested to cancel his appt with Dr. Julien Nordmann today. I called pt who said he would like to r/s. I r/s appts to next available day, pt is aware.  ?

## 2021-07-04 NOTE — Telephone Encounter (Signed)
I received a message that Mr. Romanoski daughter called to cancel her father's oncology appointments today, so I called her back to check in and see how he is doing.  ? ?She reported that he is very weak and "struggling from the stroke he had". She said he is dizzy, unable to walk on his own and having headaches. They want to allow him time to recover from the stroke before coming to oncology appointments. A home health nurse is coming to the home today for evaluation, sometime between 10-12.  ? ? ?This has been shared with his providers.  ? ? ?Mont Dutton R.T.(R)(T) ?Radiation Special Procedures Navigator  ?

## 2021-07-05 ENCOUNTER — Telehealth: Payer: Self-pay

## 2021-07-05 ENCOUNTER — Ambulatory Visit (INDEPENDENT_AMBULATORY_CARE_PROVIDER_SITE_OTHER): Payer: PPO | Admitting: Family Medicine

## 2021-07-05 VITALS — BP 118/78 | HR 98 | Temp 97.5°F | Ht 67.0 in | Wt 132.8 lb

## 2021-07-05 DIAGNOSIS — I10 Essential (primary) hypertension: Secondary | ICD-10-CM

## 2021-07-05 DIAGNOSIS — E11649 Type 2 diabetes mellitus with hypoglycemia without coma: Secondary | ICD-10-CM

## 2021-07-05 DIAGNOSIS — I614 Nontraumatic intracerebral hemorrhage in cerebellum: Secondary | ICD-10-CM

## 2021-07-05 DIAGNOSIS — C3412 Malignant neoplasm of upper lobe, left bronchus or lung: Secondary | ICD-10-CM

## 2021-07-05 DIAGNOSIS — I6522 Occlusion and stenosis of left carotid artery: Secondary | ICD-10-CM | POA: Diagnosis not present

## 2021-07-05 DIAGNOSIS — E785 Hyperlipidemia, unspecified: Secondary | ICD-10-CM | POA: Diagnosis not present

## 2021-07-05 DIAGNOSIS — I2699 Other pulmonary embolism without acute cor pulmonale: Secondary | ICD-10-CM

## 2021-07-05 MED ORDER — OXYCODONE-ACETAMINOPHEN 7.5-325 MG PO TABS: 1.0000 | ORAL_TABLET | ORAL | 0 refills | Status: DC | PRN

## 2021-07-05 NOTE — Telephone Encounter (Signed)
Will w/[facility] verbal orders for with Ocupationa Therapy @  ? ?2Xs a wk 2 weeks ? ?1x's wk for 1 week ?

## 2021-07-05 NOTE — Progress Notes (Signed)
? ?Subjective:  ? ? Patient ID: Micheal Donovan, male    DOB: [birth year], 71 y.o.   MRN: [id number] ? ?HPI ?Admit date: 06/20/2021 ?Discharge date: 06/27/2021 ?  ?Discharge Diagnoses:  ?Principal Problem: ?  Intraparenchymal hemorrhage of brain ([place]) ?Active Problems: ?  Pressure injury of skin ?Pulmonary mass with hilar adenopathy ?Uncontrolled diabetes mellitus ?Hypertension ?Hyperlipidemia ?Medical noncompliance ?Pulmonary emboli ?  ? ? ?Brief HPI:   Micheal Donovan is a 71 y.o. right-handed male with history of diabetes mellitus with diabetic retinopathy as well as medical noncompliance hypertension hyperlipidemia carotid ultrasound 2014 with bilateral ICA stenosis 60 to 79%, quit smoking 30 years ago.  Per chart review patient lives with spouse independent prior to admission.  Presented 06/10/2021 to [facility] with progressive headache dizziness and gait ataxia x1 week.  He initially went to see his primary care doctor initially thought sinus headache placed on steroids and antibiotics.  Cranial CT scan showed acute to subacute intraparenchymal hemorrhage involving the left cerebellum measuring 3.3 x 2.7 x 4.3 cm.  Surrounding low-density vasogenic edema with mild regional mass effect.  Partial effacement of the fourth ventricle with no hydrocephalus.  He was transferred to [facility].  CT angiogram head and neck negative for large vessel occlusion.  Bulky calcified plaque about the left carotid bulb/proximal cervical left ICA with associated stenosis of up to 75%.  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  Finding was suspected to be incidental in nature.  Chest x-ray showed a 4.4 cm left midlung pulmonary mass concerning for malignancy.  MRI of the brain follow-up 06/12/2021 no significant interval change.  Admission chemistries unremarkable except WBC 12,200 glucose 393 hemoglobin A1c 13.6 BUN 32.  Patient did require insulin drip in the ED for management of blood sugars.   Echocardiogram with ejection fraction of 55 to 60% no wall motion abnormalities grade 1 diastolic dysfunction.  Neurology follow-up in regards to intraparenchymal hemorrhage close monitoring of blood pressure.  Recommendations were to begin aspirin 81 mg daily due to carotid stenosis in 5 to 7 days beginning approximately 06/21/2021.  He was started on Medrol Dosepak per neurology for headaches.  In regards to patient's findings of pulmonary mass noted on chest x-ray CT of chest completed 06/13/2021 showing a 4.3 x 3.6 x 3.3 cm lobe a large spiculated mass in the posterior left upper lobe with teetering to the lateral pleural and major fissure with imaging consistent with primary bronchogenic neoplasm.  Pulmonary service consulted in regards to pulmonary mass initially reluctant patient later underwent bronchoscopy 06/17/2021 per Dr. Ina Homes showing no subcarinal adenopathy by EBUS ultrasound.  Left hilar node sampled x6 and current plans to follow-up outpatient oncology services.  During work-up with pulmonary mass CT/AP completed to look for evidence of metastatic disease showing no acute pathology however noted findings of right lower lobe pulmonary embolism no evidence of heart strain.  Venous Doppler studies completed negative for DVT follow-up cranial CT scan to establish anticoagulation for follow-up of left cerebellar hemorrhagic conversion showed mild contraction of hematoma no hydrocephalus.  Patient was cleared by neurology service for low-dose intensity heparin intravenously without bolus.  Therapy evaluations completed patient was admitted for a comprehensive rehab program ?  ?  ?Hospital Course: Micheal Donovan was admitted to rehab 06/20/2021 for inpatient therapies to consist of PT, ST and OT at least three hours five days a week. Past admission physiatrist, therapy team and rehab RN have worked together  to provide customized collaborative inpatient rehab.  In regards to patient's IPH involving the  left cerebellum monitored closely by serial imaging hospital course complicated by finding of pulmonary embolism right lower lobe without heart strain initially cleared for low-dose IV heparin he was later cleared to begin transition to Eliquis.  Bouts of headache Fioricet as needed tramadol as needed completing Medrol Dosepak.  Blood sugars overall monitored hemoglobin A1c 13.6 full diabetic teaching.  Blood pressure controlled on Cozaar as well as Norvasc.  Hyperlipidemia Lipitor as advised.  Findings of pulmonary mass with hilar adenopathy status post bronchoscopy 06/17/2021 follow-up outpatient oncology.  He did have some mild tachycardia blood pressure controlled placed on low-dose Toprol. ? ?L hilar node sampled x6 and consistent with poorly differentiated adenocarcinoma. For further staging patient underwent CT abdomen/pelvis W on 06/18/21 without evidence of metastatic disease but there was evidence of a RLL pulmonary artery embolus without R heart strain for which patient initiated anticoagulation.  ?  ?Patient discussed in [facility] conference and due to patient presentation with a cerebellar hemorrhage, he was referred for discussion in our multidisciplinary brain tumor conference. After discussion with neuroradiology, neuro-oncology and neurosurgery, the consensus after review of his brain imaging was that there does not seem to be any underlying malignant lesion and the consensus recommendation was to proceed with a 3T MRI with 1 mm slices in mid June to better delineate evolving hemorrhage site and to rule out other small lesions elsewhere in the brain.  ?  ?Current consensus is to proceed with definitive treatment of his T2b N1 M0 adenocarcinoma of the LUL with radiation therapy. ?  ?  ?Medications at discharge. ?1.  Tylenol as needed ?2.  Norvasc 10 mg p.o. daily ?3.  Eliquis 5 mg p.o. twice daily ?4.  Lipitor 40 mg p.o. daily ?5.  Fioricet 1 tablet every 6 hours as needed headache ?6.  Mucinex 1200 mg  p.o. twice daily ?7.  NovoLog 6 units 3 times daily with meals ?8.  Lantus insulin 32 units daily ?9.  Claritin 10 mg p.o. daily ?10.  Cozaar 50 mg p.o. daily ?11.  Antivert 12.5 mg p.o. twice daily as needed dizziness ?12.  Toprol-XL 12.5 mg p.o. daily ?13.  Dulera 2 puffs twice daily ?14.  Protonix 40 mg p.o. daily ?15.  Senokot S1 tablet p.o. twice daily ?16.  Topamax 100 mg p.o. nightly ?17.  Tramadol 100 mg every 6 hours as needed moderate pain ?18.  Vitamin D 50,000 units every 7 days ?  ?  ?07/05/21 ?Here for follow up.  He is accompanied by his daughter.  Patient's wife developed pneumonia shortly after he went to the hospital.  She ultimately required a ventilator with trach and she is still hospitalized.  Therefore the patient's family is under tremendous stress.  They are going back and forth to the hospital caring for her mother and also taking care of her dad.  He needs 24-hour a day care.  He has a difficult time standing and walking.  It takes 2 people to get him out of the house and into the car and 2 people to get him back into the home.  He is unable to go to the bathroom without assistance.  Patient today is sitting in a wheelchair.  His eyes are closed.  He appears to be in discomfort.  He states that he is having a severe headache every day.  He states its been like that ever since he went to the  hospital.  There has been no acute change.  He denies any seizure activity.  He denies any worsening of the headache.  He states has been stable.  However the Fioricet and Tylenol ineffective.  He states that they gave him steroids in the hospital but that did not help either.  The headache is debilitating for the patient.  He also complains of dizziness.  He states that he feels extremely weak and unsteady on his feet.  Patient's daughter states that she is unable to provide the level of care that her father needs.  He is not eating well.  He is drinking.  He is taking random doses of his insulin.  He  states his insulin use on what ever his sugars are running in the morning.  Therefore his Lantus dose is fluctuating.  He has not been taking the NovoLog with mealtimes because he has not been eating meals.  He stat

## 2021-07-08 ENCOUNTER — Telehealth: Payer: Self-pay | Admitting: Family Medicine

## 2021-07-08 NOTE — Telephone Encounter (Signed)
Patients daughter [name] called states they will need the form that Dr. Dennard Schaumann mention that he could fill out so she can get her dad into a facility. Patients daughter has some additional questions from the visit on Friday. She isn't sure who she could or should reach out to and see if they can help get him in a facility.  ? ?CB# [phone] ?

## 2021-07-09 ENCOUNTER — Telehealth: Payer: Self-pay

## 2021-07-09 NOTE — Telephone Encounter (Signed)
Spoke with Stanton Kidney and advised. She will contact patient's daughter and let her know as well. Nothing further needed at this time.  ? ?

## 2021-07-09 NOTE — Telephone Encounter (Signed)
Mary w/[facility] called to report patient has been coughing and the mucus has some blood in it. She states when he showed it to her the blood was darker, but was in a cup so she does not know how long it had been since it was expelled. She states the patient denies chest pain, shob or other symptoms. He is still having headaches frequently. She would like to know if you have any recommendations for the cough/blood. ? ?Please advise, thanks! ?

## 2021-07-09 NOTE — Telephone Encounter (Signed)
FL 2 form completed. ? ?Micheal Donovan to advise  ?

## 2021-07-09 NOTE — Telephone Encounter (Signed)
Daughter returned call and advised FL2 form ready for pick up.  ?

## 2021-07-11 ENCOUNTER — Telehealth: Payer: Self-pay

## 2021-07-11 ENCOUNTER — Inpatient Hospital Stay: Payer: PPO | Admitting: Family Medicine

## 2021-07-11 DIAGNOSIS — I613 Nontraumatic intracerebral hemorrhage in brain stem: Secondary | ICD-10-CM | POA: Diagnosis not present

## 2021-07-11 DIAGNOSIS — M6259 Muscle wasting and atrophy, not elsewhere classified, multiple sites: Secondary | ICD-10-CM | POA: Diagnosis not present

## 2021-07-11 DIAGNOSIS — I10 Essential (primary) hypertension: Secondary | ICD-10-CM | POA: Diagnosis not present

## 2021-07-11 DIAGNOSIS — R2689 Other abnormalities of gait and mobility: Secondary | ICD-10-CM | POA: Diagnosis not present

## 2021-07-11 DIAGNOSIS — M6281 Muscle weakness (generalized): Secondary | ICD-10-CM | POA: Diagnosis not present

## 2021-07-11 DIAGNOSIS — E782 Mixed hyperlipidemia: Secondary | ICD-10-CM | POA: Diagnosis not present

## 2021-07-11 DIAGNOSIS — E1165 Type 2 diabetes mellitus with hyperglycemia: Secondary | ICD-10-CM | POA: Diagnosis not present

## 2021-07-11 DIAGNOSIS — I480 Paroxysmal atrial fibrillation: Secondary | ICD-10-CM | POA: Diagnosis not present

## 2021-07-11 DIAGNOSIS — I619 Nontraumatic intracerebral hemorrhage, unspecified: Secondary | ICD-10-CM | POA: Diagnosis not present

## 2021-07-11 NOTE — Telephone Encounter (Signed)
Robin with [facility] called to report she is at patient's home for a visit. She states he is delusional, hallucinating and refusing to take his medications. She also reports his daughter is expressing this is a drastic change in behavior for him. They are concerned about his health and mental status. She would like to call EMS to have patient transported to ED for evaluation. Agree with assessment and plan. ? ?FYI - thanks! ?

## 2021-07-11 NOTE — Telephone Encounter (Signed)
Per chart pt is not in any ED in our system. ? ?Spoke with Shirlean Mylar, she states she did call EMS and they came to the home and did evaluation. Patient's BG was 262, other vitals were normal. She states patient refused transport and EMS stated they could not force him to go as he seemed coherent enough to be able to refuse. Shirlean Mylar and patient's daughter did not agree, but they were not able to persuade patient to go. Shirlean Mylar also reports daughter states she has received word that [facility] available and patient must be registered there by 4:30pm today. Per daughter she is planning to take him there, if he will cooperate and get in the car.  ? ?Shirlean Mylar states another nurse is scheduled to go to patient's home tomorrow, as patient became very angry and combative with her so she is not able to return. She states they will call us if they have any concerns.  ? ?FYI - thanks! ? ? ?

## 2021-07-12 ENCOUNTER — Telehealth: Payer: Self-pay | Admitting: Family Medicine

## 2021-07-12 ENCOUNTER — Other Ambulatory Visit: Payer: Self-pay | Admitting: Family Medicine

## 2021-07-12 DIAGNOSIS — E1165 Type 2 diabetes mellitus with hyperglycemia: Secondary | ICD-10-CM | POA: Diagnosis not present

## 2021-07-12 DIAGNOSIS — I480 Paroxysmal atrial fibrillation: Secondary | ICD-10-CM | POA: Diagnosis not present

## 2021-07-12 DIAGNOSIS — I613 Nontraumatic intracerebral hemorrhage in brain stem: Secondary | ICD-10-CM | POA: Diagnosis not present

## 2021-07-12 DIAGNOSIS — I10 Essential (primary) hypertension: Secondary | ICD-10-CM | POA: Diagnosis not present

## 2021-07-12 DIAGNOSIS — E782 Mixed hyperlipidemia: Secondary | ICD-10-CM | POA: Diagnosis not present

## 2021-07-12 MED ORDER — UNABLE TO FIND: 0 refills | Status: DC

## 2021-07-12 MED ORDER — INSULIN GLARGINE 100 UNIT/ML SOLOSTAR PEN: 25.0000 [IU] | PEN_INJECTOR | Freq: Every day | SUBCUTANEOUS | 11 refills | Status: DC

## 2021-07-12 MED ORDER — OXYCODONE-ACETAMINOPHEN 7.5-325 MG PO TABS: 1.0000 | ORAL_TABLET | ORAL | 0 refills | Status: DC | PRN

## 2021-07-12 NOTE — Telephone Encounter (Signed)
Patients daughter called states that her dad is now in an assistant living [facility]. She states that he signed his self in and he can sign his self out. Last night he was okay with being there this morning not so much. She states that on his medication list for [facility] they don't have down his pain medication and or his adjusting his insulin to 25. She would like to know if Dr. Dennard Schaumann could send over the orders for those medications to be added. She also would like to know if Dr. Dennard Schaumann could call patient and reassure him that he is in a good place. ? ?CB# [phone] ?

## 2021-07-12 NOTE — Telephone Encounter (Signed)
Spoke with patient's daughter, [name] ([place]), she reports patient has checked himself out AMA from [facility] He did not even wait to receive discharge papers. He called his neighbors and had them come get him and take him home. She states the patient has told her "don't worry about me any more" and that he does not want her taking care of him any longer. She states the entire family has tried to convince him to stay at [facility] and he refuses. Advised her to try and monitor him for the next few days. If he is truly having hallucination and delirium, and refusing to take his medications, she needs to call 911 for assistance, and possible IVC, so he can have assessment in ED for his medical and psych concerns. ?She voiced understanding. She is also aware there is always a provider on call when office is closed.  ? ?She is very concerned about him being home alone and refusing assistance from family. She is attempting to arrange in-home care but has not been able to secure anything as yet. Also advised her to try calling [address], to see if they have any programs that may be able to assist her.  ? ?FYI - thanks! ?

## 2021-07-12 NOTE — Telephone Encounter (Signed)
Please sign orders so they will print. Thanks! ?

## 2021-07-15 ENCOUNTER — Telehealth: Payer: Self-pay

## 2021-07-15 DIAGNOSIS — I619 Nontraumatic intracerebral hemorrhage, unspecified: Secondary | ICD-10-CM

## 2021-07-15 DIAGNOSIS — Z5189 Encounter for other specified aftercare: Secondary | ICD-10-CM

## 2021-07-15 DIAGNOSIS — C3490 Malignant neoplasm of unspecified part of unspecified bronchus or lung: Secondary | ICD-10-CM | POA: Diagnosis not present

## 2021-07-15 NOTE — Telephone Encounter (Signed)
[facility] Man[facility] ?[phone] ?Fax [phone] ? ?Per Daughter stated that pt checked himself out of [facility] Friday, Per Lauren notice that pt has  ?Has a wound and would like orders for nursing.  ? ?Orders sent ?

## 2021-07-16 ENCOUNTER — Ambulatory Visit
Admission: RE | Admit: 2021-07-16 | Discharge: 2021-07-16 | Disposition: A | Discharge status: Home / Self Care | Payer: PPO | Source: Ambulatory Visit | Attending: Radiation Oncology | Admitting: Radiation Oncology

## 2021-07-16 ENCOUNTER — Encounter: Payer: Self-pay | Admitting: Radiation Oncology

## 2021-07-16 ENCOUNTER — Other Ambulatory Visit: Payer: Self-pay | Admitting: Radiation Therapy

## 2021-07-16 ENCOUNTER — Other Ambulatory Visit: Payer: Self-pay

## 2021-07-16 VITALS — BP 106/72 | HR 93 | Temp 98.0°F | Resp 20 | Ht 67.0 in | Wt 135.0 lb

## 2021-07-16 DIAGNOSIS — I614 Nontraumatic intracerebral hemorrhage in cerebellum: Secondary | ICD-10-CM | POA: Insufficient documentation

## 2021-07-16 DIAGNOSIS — E119 Type 2 diabetes mellitus without complications: Secondary | ICD-10-CM | POA: Insufficient documentation

## 2021-07-16 DIAGNOSIS — I10 Essential (primary) hypertension: Secondary | ICD-10-CM | POA: Insufficient documentation

## 2021-07-16 DIAGNOSIS — C7931 Secondary malignant neoplasm of brain: Secondary | ICD-10-CM

## 2021-07-16 DIAGNOSIS — C3412 Malignant neoplasm of upper lobe, left bronchus or lung: Secondary | ICD-10-CM

## 2021-07-16 DIAGNOSIS — Z7901 Long term (current) use of anticoagulants: Secondary | ICD-10-CM | POA: Diagnosis not present

## 2021-07-16 DIAGNOSIS — N400 Enlarged prostate without lower urinary tract symptoms: Secondary | ICD-10-CM | POA: Insufficient documentation

## 2021-07-16 DIAGNOSIS — J320 Chronic maxillary sinusitis: Secondary | ICD-10-CM | POA: Insufficient documentation

## 2021-07-16 DIAGNOSIS — Z79899 Other long term (current) drug therapy: Secondary | ICD-10-CM | POA: Diagnosis not present

## 2021-07-16 DIAGNOSIS — I2699 Other pulmonary embolism without acute cor pulmonale: Secondary | ICD-10-CM | POA: Insufficient documentation

## 2021-07-16 DIAGNOSIS — Z85820 Personal history of malignant melanoma of skin: Secondary | ICD-10-CM | POA: Diagnosis not present

## 2021-07-16 DIAGNOSIS — Z794 Long term (current) use of insulin: Secondary | ICD-10-CM | POA: Insufficient documentation

## 2021-07-16 DIAGNOSIS — Z87891 Personal history of nicotine dependence: Secondary | ICD-10-CM | POA: Diagnosis not present

## 2021-07-16 DIAGNOSIS — E785 Hyperlipidemia, unspecified: Secondary | ICD-10-CM | POA: Diagnosis not present

## 2021-07-16 DIAGNOSIS — I619 Nontraumatic intracerebral hemorrhage, unspecified: Secondary | ICD-10-CM

## 2021-07-16 DIAGNOSIS — E11319 Type 2 diabetes mellitus with unspecified diabetic retinopathy without macular edema: Secondary | ICD-10-CM | POA: Diagnosis not present

## 2021-07-16 DIAGNOSIS — R042 Hemoptysis: Secondary | ICD-10-CM

## 2021-07-16 NOTE — Addendum Note (Signed)
Addended by: Pincus Large on: 07/16/2021 12:08 PM ? ? Modules accepted: Orders ? ?

## 2021-07-16 NOTE — Progress Notes (Signed)
?Radiation Oncology         ([phone]) [phone] ?________________________________ ? ?Initial outpatient Consultation ? ?Name: Micheal Donovan MRN: [id number]  ?Date of Service: 07/16/2021 DOB: 05-03-1950 ? ?HE:RDEYCXK, Cammie Mcgee, MD  Curt Bears, MD  ? ?REFERRING PHYSICIAN: Curt Bears, MD ? ?DIAGNOSIS: 71 y/o male with T2b,N1,M0, NSCLC, poorly differentiated adenocarcinoma of the LUL lung with left hilar adenopathy. ? ?  ICD-10-CM   ?1. Malignant neoplasm of upper lobe bronchus, left (HCC)  C34.12   ?  ?2. Primary non-small cell carcinoma of upper lobe of left lung (HCC)  C34.12   ?  ? ? ?Oncology History Overview Note  ?Diagnosis: Stage IIB T2b N1 M0 adenocarcinoma of the LUL, poorly differentiated  ? ?Medical Oncologist: Julien Nordmann ?Radiation Oncologist: Tammi Klippel ?Pulmonologist: [place] ?  ?Lung cancer ([place])  ?06/11/2021 Imaging  ? CT Angio Head and Neck WWO: patchy consolidative LUL opacity, suspicious for pneumonia ?  ?06/12/2021 Imaging  ? Chest x-ray: 4.4 cm L midlung pulmonary mass concerning for malignancy  ?  ?06/13/2021 Imaging  ? CT Chest W: .3 x 3.6 x 3.3 cm lobular spiculated mass in the posterior L upper lobe with tethering to the lateral pleura and major fissure consistent with a primary bronchogenic neoplasm with associated metastatic L hilar lymphadenopathy ? ?MRI Brain WWO: known L cerebellar hematoma 4.6 x 3.5 x 2.7 cm without discernible enhancement to suggest underlying mass lesion or metastatic disease ?  ?06/17/2021 Initial Diagnosis  ? Presented with acute dizziness ?  ?06/17/2021 Initial Biopsy  ? Station 11L FNA: poorly differentiated non-small cell carcinoma, consistent with adenocarcinoma  ?  ?06/18/2021 Imaging  ? CT Abdomen Pelvis W: no metastatic disease in abdomen/pelvis  ?  ? ?HISTORY OF PRESENT ILLNESS: Micheal Donovan is a 71 y.o. male seen at the request of Dr. Julien Nordmann for discussion of a newly diagnosed NSCLC in the left upper lobe lobe with left hilar lymphadenopathy. He initially presented  to an OSH on 06/10/21 with progressively worsening headache, dizziness and gait ataxia for 1 week. CT head showed acute to subacute intraparenchymal hemorrhage in the left cerebellum measuring 3.3 x 2.7 x 4.3 cm with surrounding low-density vasogenic edema with mild regional mass effect and partial effacement of the fourth ventricle without hydrocephalus. He was transferred to [facility] for further work up and evaluation. Subsequent MRI brain on 06/12/21 showed no interval change in size and no discernible enhancement to suggest underlying mass lesion. CT angio of the head and neck incidentally noted a patchy, consolidative LUL opacity, suspicious for pneumonia for which patient underwent chest x-ray. Unfortunately, that demonstrated a 4.4 cm left midlung pulmonary mass concerning for malignancy. This was further evaluated with a CT Chest performed on 06/13/21 and demonstrated a 4.3 x 3.6 x 3.3 cm lobular, spiculated mass in the posterior LUL with tethering to the lateral pleura and major fissure consistent with a primary bronchogenic neoplasm with associated metastatic left hilar lymphadenopathy. Pulmonology was consulted inpatient and patient underwent bronchoscopy on 06/17/21 with Dr. Ina Homes which showed no subcarinal adenopathy by EBUS. A left hilar node was sampled x6 and consistent with poorly differentiated adenocarcinoma. For further staging, the patient underwent CT A/P on 06/18/21 without evidence of metastatic disease but there was evidence of a RLL pulmonary embolus without R heart strain for which patient initiated anticoagulation.  ? ?Patient was discussed in [facility] conference and due to patient presentation with a cerebellar hemorrhage, he was referred for discussion in our multidisciplinary brain tumor conference. After  discussion and review of his brain imaging with neuroradiology, neuro-oncology and neurosurgery, there does not appear to be any underlying malignant lesion so the consensus  recommendation was to proceed with a 3T MRI with 1 mm slices in mid June 7026 to better delineate evolving hemorrhage site and to rule out any small lesions elsewhere in the brain.  ? ?Current consensus is to proceed with definitive treatment of his T2b N1 M0 adenocarcinoma of the LUL for which he presents to [place] today for discussion of radiation therapy. ? ?PREVIOUS RADIATION THERAPY: No ? ?PAST MEDICAL HISTORY:  ?Past Medical History:  ?Diagnosis Date  ? Allergy   ? BPH (benign prostatic hypertrophy)   ? Cancer [facility])   ? Melanoma on Neck    2008  ? Carotid artery occlusion   ? Diabetes mellitus   ? ED (erectile dysfunction)   ? Hyperlipidemia   ? Hypertension   ? Retinopathy due to secondary DM ([place])   ?   ? ?PAST SURGICAL HISTORY: ?Past Surgical History:  ?Procedure Laterality Date  ? BRONCHIAL NEEDLE ASPIRATION BIOPSY  06/17/2021  ? Procedure: BRONCHIAL NEEDLE ASPIRATION BIOPSIES;  Surgeon: Candee Furbish, MD;  Location: [facility] ENDOSCOPY;  Service: Pulmonary;;  ? MELANOMA EXCISION  2008  ? Left side of neck  ? RADIOLOGY WITH ANESTHESIA N/A 04/05/2020  ? Procedure: MRI SPINE WITOUT CONTRAST;  Surgeon: Radiologist, Medication, MD;  Location: [place];  Service: Radiology;  Laterality: N/A;  ? TONSILLECTOMY    ? VIDEO BRONCHOSCOPY WITH ENDOBRONCHIAL ULTRASOUND N/A 06/17/2021  ? Procedure: VIDEO BRONCHOSCOPY WITH ENDOBRONCHIAL ULTRASOUND;  Surgeon: Candee Furbish, MD;  Location: [facility] ENDOSCOPY;  Service: Pulmonary;  Laterality: N/A;  ? ? ?FAMILY HISTORY:  ?Family History  ?Problem Relation Age of Onset  ? COPD Mother   ? Heart disease Father   ? Heart disease Brother   ?     MI at age 2  ? ? ?SOCIAL HISTORY:  ?Social History  ? ?Socioeconomic History  ? Marital status: Single  ?  Spouse name: Not on file  ? Number of children: Not on file  ? Years of education: Not on file  ? Highest education level: Not on file  ?Occupational History  ? Not on file  ?Tobacco Use  ? Smoking status: Former  ?  Types: Cigarettes  ?  Quit date:  07/21/1990  ?  Years since quitting: 31.0  ? Smokeless tobacco: Never  ?Vaping Use  ? Vaping Use: Never used  ?Substance and Sexual Activity  ? Alcohol use: No  ? Drug use: No  ? Sexual activity: Not on file  ?Other Topics Concern  ? Not on file  ?Social History Narrative  ? Not on file  ? ?Social Determinants of Health  ? ?Financial Resource Strain: Not on file  ?Food Insecurity: Not on file  ?Transportation Needs: Not on file  ?Physical Activity: Not on file  ?Stress: Not on file  ?Social Connections: Not on file  ?Intimate Partner Violence: Not on file  ? ? ?ALLERGIES: Niaspan [niacin] ? ?MEDICATIONS:  ?Current Outpatient Medications  ?Medication Sig Dispense Refill  ? acetaminophen (TYLENOL) 325 MG tablet Take 2 tablets (650 mg total) by mouth every 4 (four) hours as needed for mild pain (or temp > 37.5 C (99.5 F)).    ? amLODipine (NORVASC) 10 MG tablet Take 1 tablet (10 mg total) by mouth daily. 30 tablet 0  ? apixaban (ELIQUIS) 5 MG TABS tablet Take 1 tablet (5 mg total) by  mouth 2 (two) times daily. 60 tablet 0  ? atorvastatin (LIPITOR) 40 MG tablet Take 1 tablet (40 mg total) by mouth daily. 30 tablet 0  ? insulin glargine (LANTUS) 100 UNIT/ML Solostar Pen Inject 25 Units into the skin daily. 15 mL 11  ? loratadine (CLARITIN) 10 MG tablet Take 1 tablet (10 mg total) by mouth daily. 30 tablet 0  ? metoprolol succinate (TOPROL-XL) 25 MG 24 hr tablet Take 0.5 tablets (12.5 mg total) by mouth daily. 30 tablet 0  ? oxyCODONE-acetaminophen (PERCOCET) 7.5-325 MG tablet Take 1 tablet by mouth every 4 (four) hours as needed for severe pain. 60 tablet 0  ? pantoprazole (PROTONIX) 40 MG tablet Take 1 tablet (40 mg total) by mouth daily at 6 (six) AM. 30 tablet 0  ? topiramate (TOPAMAX) 100 MG tablet Take 1 tablet (100 mg total) by mouth at bedtime. 30 tablet 0  ? Vitamin D, Ergocalciferol, (DRISDOL) 1.25 MG (50000 UNIT) CAPS capsule Take 1 capsule (50,000 Units total) by mouth every 7 (seven) days. 5 capsule 0  ?  butalbital-acetaminophen-caffeine (FIORICET) 50-325-40 MG tablet Take 1 tablet by mouth every 6 (six) hours as needed for headache. (Patient not taking: Reported on 07/16/2021) 10 tablet 0  ? insulin aspart (NOVO

## 2021-07-16 NOTE — Progress Notes (Signed)
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 ?Associated Dx:  Left Cerebellar Hemorrhage (06/10/2021) ? ?CT Head Neck wo contrast (5MM) 06/11/2021 ?IMPRESSION: ?1. No significant interval change in size and morphology of left ?cerebellar intraparenchymal hemorrhage with similar regional mass ?effect. Stable partial effacement of the fourth ventricle without ?progressive hydrocephalus. ?2. No other new acute intracranial abnormality. ? ?CT Angio Head Neck wo contrast (5mm) 06/11/2021 ?IMPRESSION: ?1. Negative CTA for large vessel occlusion. ?2. Bulky calcified plaque about the left carotid bulb/proximal cervical left ICA with associated stenosis of up to 75% by NASCET criteria. ?3. Moderate approximate 50% stenosis at the origin of the left vertebral artery. ?4. Severe atheromatous irregularity throughout the left V4 segment, with associated severe multifocal stenoses. ?5. 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 Finding is suspected to be incidental in nature, and unlikely to be the source of the left cerebellar hemorrhage. No other vascular abnormality seen underlying the left cerebellar bleed. ?6. Moderate atheromatous stenosis involving the dominant mid right V4 segment. ?7. Patchy consolidative left upper lobe opacity, suspicious for pneumonia. ?8. Emphysema (ICD10-J43.9). ? ?DG Chest [facility] 06/12/2021 ?IMPRESSION: ?1. A 4.4 cm left mid lung pulmonary mass concerning for malignancy until proven otherwise. Recommend further evaluation with a CT of the chest with intravenous contrast. ? ?MR Brain  06/12/2021 ?IMPRESSION: ?No significant interval change in size of the left cerebellar intraparenchymal hematoma with unchanged regional mass effect and partial effacement of fourth ventricle but no upstream ?hydrocephalus. There is no discernible enhancement to suggest underlying mass lesion, though acute blood products could mask enhancement. Consider follow up MRI  with contrast in 2-3 months to reassess. ? ?CT Chest 06/13/2021 ?IMPRESSION: ?1. 4.3 x 3.6 x 3.3 cm lobular spiculated mass in the posterior left ?upper lobe with tethering to the lateral pleura and major fissure. ?Imaging features are consistent with primary bronchogenic neoplasm. ?2. Metastatic left hilar lymphadenopathy. ?3. Peripheral micro nodularity posterior right costophrenic sulcus, ?likely related to infectious/inflammatory etiology and/or ?aspiration. ?4. Aortic Atherosclerosis (ICD10-I70.0). ? ?CT Abdomen Pelvis 06/18/2021 ?IMPRESSION: ?1. Right lower lobe pulmonary artery embolus. No evidence of right ?heart straining. ?2. No acute intra-abdominal or pelvic pathology. ?3. Aortic Atherosclerosis (ICD10-I70.0). ? ?CT Head 06/19/2021 ?IMPRESSION: ?1. Slight contraction of the LEFT cerebellar intraparenchymal ?hemorrhage. ?2. Persistent effacement of the fourth ventricle with no ?hydrocephalus identified. No change in ventricular volume compared ?to prior. ?3. No new intraparenchymal or extra-axial hemorrhage. ?4. Maxillary sinusitis. ? ? ?Tobacco/Marijuana/Snuff/ETOH use: Quit smoking 07/21/1990, no smokeless tobacco, drug or alcohol use. ? ?Past/Anticipated interventions by cardiothoracic surgery, if any:  ? ?Past/Anticipated interventions by medical oncology, if any: NA ? ?Signs/Symptoms ?Weight changes, if any:  Loss 50 lbs. over month time. ?Respiratory complaints, if any: No ?Hemoptysis, if any: coughing up blood ?Pain issues, if any:  6/10  ? ?Recent neurologic symptoms, if any:  ?Seizures: No ?Headaches: yes since stroke Tylenol doesn't help. ?Nausea: Yes x 1 ?Dizziness/ataxia: When he stands get dizzy spells. ?Difficulty with hand coordination: yes ?Focal numbness/weakness:  Weakness when first get up to standing position then it subsides.Needs help sitting up per friend. ?Visual deficits/changes: Yes, vision blurred temporary ?Confusion/Memory deficits: No ? ?Painful bone metastases at present, if  any:  No ? ?SAFETY ISSUES: ?Prior radiation?  No ?Pacemaker/ICD?  No ?Possible current pregnancy? Male ?Is the patient on methotrexate? No ? ?Current Complaints / other details:   Need more information on treatment options. ?

## 2021-07-17 ENCOUNTER — Telehealth: Payer: Self-pay

## 2021-07-17 ENCOUNTER — Inpatient Hospital Stay: Payer: PPO

## 2021-07-17 ENCOUNTER — Telehealth: Payer: Self-pay | Admitting: *Deleted

## 2021-07-17 ENCOUNTER — Inpatient Hospital Stay: Payer: PPO | Admitting: Internal Medicine

## 2021-07-17 VITALS — BP 109/70 | HR 93 | Temp 97.0°F | Resp 18 | Wt 134.4 lb

## 2021-07-17 DIAGNOSIS — E876 Hypokalemia: Secondary | ICD-10-CM | POA: Diagnosis not present

## 2021-07-17 DIAGNOSIS — R519 Headache, unspecified: Secondary | ICD-10-CM | POA: Diagnosis not present

## 2021-07-17 DIAGNOSIS — C3412 Malignant neoplasm of upper lobe, left bronchus or lung: Secondary | ICD-10-CM | POA: Diagnosis not present

## 2021-07-17 DIAGNOSIS — R918 Other nonspecific abnormal finding of lung field: Secondary | ICD-10-CM

## 2021-07-17 DIAGNOSIS — Z5111 Encounter for antineoplastic chemotherapy: Secondary | ICD-10-CM | POA: Insufficient documentation

## 2021-07-17 DIAGNOSIS — Z86718 Personal history of other venous thrombosis and embolism: Secondary | ICD-10-CM | POA: Diagnosis not present

## 2021-07-17 DIAGNOSIS — R630 Anorexia: Secondary | ICD-10-CM | POA: Diagnosis not present

## 2021-07-17 DIAGNOSIS — Z7901 Long term (current) use of anticoagulants: Secondary | ICD-10-CM | POA: Diagnosis not present

## 2021-07-17 DIAGNOSIS — Z79899 Other long term (current) drug therapy: Secondary | ICD-10-CM | POA: Diagnosis not present

## 2021-07-17 LAB — CBC WITH DIFFERENTIAL (CANCER CENTER ONLY)
Abs Immature Granulocytes: 0.05 10*3/uL (ref 0.00–0.07)
Basophils Absolute: 0.1 10*3/uL (ref 0.0–0.1)
Basophils Relative: 1 %
Eosinophils Absolute: 0.3 10*3/uL (ref 0.0–0.5)
Eosinophils Relative: 3 %
HCT: 40.1 % (ref 39.0–52.0)
Hemoglobin: 13.4 g/dL (ref 13.0–17.0)
Immature Granulocytes: 1 %
Lymphocytes Relative: 22 %
Lymphs Abs: 2.4 10*3/uL (ref 0.7–4.0)
MCH: 29.1 pg (ref 26.0–34.0)
MCHC: 33.4 g/dL (ref 30.0–36.0)
MCV: 87 fL (ref 80.0–100.0)
Monocytes Absolute: 0.7 10*3/uL (ref 0.1–1.0)
Monocytes Relative: 6 %
Neutro Abs: 7.4 10*3/uL (ref 1.7–7.7)
Neutrophils Relative %: 67 %
Platelet Count: 373 10*3/uL (ref 150–400)
RBC: 4.61 MIL/uL (ref 4.22–5.81)
RDW: 14.1 % (ref 11.5–15.5)
WBC Count: 10.8 10*3/uL — ABNORMAL HIGH (ref 4.0–10.5)
nRBC: 0 % (ref 0.0–0.2)

## 2021-07-17 LAB — CMP (CANCER CENTER ONLY)
ALT: 20 U/L (ref 0–44)
AST: 18 U/L (ref 15–41)
Albumin: 3.7 g/dL (ref 3.5–5.0)
Alkaline Phosphatase: 123 U/L (ref 38–126)
Anion gap: 9 (ref 5–15)
BUN: 13 mg/dL (ref 8–23)
CO2: 25 mmol/L (ref 22–32)
Calcium: 9.3 mg/dL (ref 8.9–10.3)
Chloride: 104 mmol/L (ref 98–111)
Creatinine: 0.76 mg/dL (ref 0.61–1.24)
GFR, Estimated: >60 mL/min (ref >60)
Glucose, Bld: 167 mg/dL — ABNORMAL HIGH (ref 70–99)
Potassium: 3.1 mmol/L — ABNORMAL LOW (ref 3.5–5.1)
Sodium: 138 mmol/L (ref 135–145)
Total Bilirubin: 0.6 mg/dL (ref 0.3–1.2)
Total Protein: 7 g/dL (ref 6.5–8.1)

## 2021-07-17 MED ORDER — POTASSIUM CHLORIDE CRYS ER 20 MEQ PO TBCR: 20.0000 meq | EXTENDED_RELEASE_TABLET | Freq: Every day | ORAL | 0 refills | Status: DC

## 2021-07-17 NOTE — Telephone Encounter (Signed)
CALLED PATIENT TO INFORM OF  APPT. WITH DR. Baruch Gouty ON 07-23-21 - ARRIVAL TIME- 9:30 AM - [id number] [place]  ?PH. NO - [phone], SPOKE WITH PATIENT AND HE IS AWARE OF THIS APPT. ?

## 2021-07-17 NOTE — Telephone Encounter (Signed)
Marzetta Board PT w/[facility] [phone]  ?fax [phone] ? ?Request verbal orders for Physical Therapy for: ?2 wk 3 ?1 4wk ? ?Also would request for home health nurse to care :Disease management/wound care/ulcer pressure located on butt ? ?Referral put 07/15/21 for [facility] nurse for wound care ? ? ?

## 2021-07-17 NOTE — Progress Notes (Signed)
? ? Micheal Donovan ?[phone]   [phone] ? ?CONSULT NOTE ? ?REFERRING PHYSICIAN: Dr. Ina Homes ? ?REASON FOR CONSULTATION:  ?71 years old white male recently diagnosed with lung cancer. ? ?HPI ?PHYLLIS Donovan is a 71 y.o. male with past medical history significant for hypertension, diabetes mellitus, history of early stage melanoma on the neck diagnosed more than 10 years ago, dyslipidemia, erectile dysfunction, stroke as well as benign prostatic hypertrophy and long history of smoking but quit 20 years ago.  The patient mentioned that on the first week of April 2023 has been complaining of dizzy spells as well as lack of balance and headaches.  He was evaluated at the emergency department at [facility] and CT scan of the head without contrast on 06/10/2021 showed acute to subacute intraparenchymal hemorrhage involving the left cerebellum measuring approximately 3.3 x 2.7 x 4.3 cm with surrounding low-density vasogenic edema with mild regional mass effect and partial effacement of the fourth ventricle.  This was further evaluated by repeat CT scan of the head as well as CT angio of the head and neck followed by MRI of the brain on 06/12/2021 that showed no significant changes in the size of the left cerebellar intraparenchymal hematoma with unchanged regional mass effect and partial effacement of the fourth ventricle but no upstream hydrocephalus.  There was no enhancement to suggest underlying mass lesion.  During his evaluation he had CT of the chest on 06/13/2021 and it showed 4.3 x 3.6 x 3.3 cm lobular spiculated mass in the posterior left upper lobe with tethering to the lateral pleura and major fissure consistent with a primary bronchogenic neoplasm.  There was also evidence of metastatic left hilar adenopathy.  CT scan of the abdomen and pelvis on 06/18/2021 was negative for malignancy.  On 06/17/2021 the patient underwent bronchoscopy with EBUS under the care of Dr.  Tamala Julian and the final pathology [id number]) from the fine-needle aspiration of station 11 L showed malignant cells consistent with poorly differentiated non-small cell carcinoma consistent with adenocarcinoma.The malignant cells are TTF-1 positive and negative for p40 (squamous marker) and negative for neuroendocrine markers (synaptophysin, CD56). This immunophenotype is supportive of pulmonary adenocarcinoma.  ?Blood test and tissue samples were submitted to [facility] and that showed PD-L1 expression of 97% but no actionable mutation on the blood test.  The result from the tissue molecular studies are still pending probably secondary and to insufficient material. ?The patient is currently on anticoagulation with Eliquis 5 mg p.o. twice daily secondary to bulky calcified plaques about the left carotid bulb/proximal cervical left ICA with associated stenosis up to 75%. ?After rehabilitation the patient was seen by Dr. Tammi Klippel at the [facility].  He was also referred to me today for evaluation and recommendation regarding treatment of his condition. ?When seen today he continues to have the dizzy spells especially when standing suddenly and he has to take some time before moving.  He continues to complain of generalized weakness and left-sided chest pain with mild cough and occasional hemoptysis.  He also has some nausea and vomiting occasionally after the dizzy spells.  He lost around 35 pounds in the last 3 weeks. ?Family history significant for mother with COPD.  Father and brother with heart disease and daughter with breast cancer. ?The patient is married and has 1 daughter, Micheal Donovan who accompanied him to the visit today.  He used to work in Research officer, trade [employer].  He lives in the [place] to [facility] and He Would Prefer His Care to Be Done There for Convenience.  He Has a History of Smoking 1 Pack/Day for around 30 Years and Quit 20 Years Ago.  He Also Drinks Alcohol  Occasionally and No History of Drug Abuse. ? ?HPI ? ?Past Medical History:  ?Diagnosis Date  ? Allergy   ? BPH (benign prostatic hypertrophy)   ? Cancer [facility])   ? Melanoma on Neck    2008  ? Carotid artery occlusion   ? Diabetes mellitus   ? ED (erectile dysfunction)   ? Hyperlipidemia   ? Hypertension   ? Retinopathy due to secondary DM ([place])   ? ? ?Past Surgical History:  ?Procedure Laterality Date  ? BRONCHIAL NEEDLE ASPIRATION BIOPSY  06/17/2021  ? Procedure: BRONCHIAL NEEDLE ASPIRATION BIOPSIES;  Surgeon: Candee Furbish, MD;  Location: [facility] ENDOSCOPY;  Service: Pulmonary;;  ? MELANOMA EXCISION  2008  ? Left side of neck  ? RADIOLOGY WITH ANESTHESIA N/A 04/05/2020  ? Procedure: MRI SPINE WITOUT CONTRAST;  Surgeon: Radiologist, Medication, MD;  Location: [place];  Service: Radiology;  Laterality: N/A;  ? TONSILLECTOMY    ? VIDEO BRONCHOSCOPY WITH ENDOBRONCHIAL ULTRASOUND N/A 06/17/2021  ? Procedure: VIDEO BRONCHOSCOPY WITH ENDOBRONCHIAL ULTRASOUND;  Surgeon: Candee Furbish, MD;  Location: [facility] ENDOSCOPY;  Service: Pulmonary;  Laterality: N/A;  ? ? ?Family History  ?Problem Relation Age of Onset  ? COPD Mother   ? Heart disease Father   ? Heart disease Brother   ?     MI at age 79  ? ? ?Social History ?Social History  ? ?Tobacco Use  ? Smoking status: Former  ?  Types: Cigarettes  ?  Quit date: 07/21/1990  ?  Years since quitting: 31.0  ? Smokeless tobacco: Never  ?Vaping Use  ? Vaping Use: Never used  ?Substance Use Topics  ? Alcohol use: No  ? Drug use: No  ? ? ?Allergies  ?Allergen Reactions  ? Niaspan [Niacin]   ?  FLUSHING  ? ? ?Current Outpatient Medications  ?Medication Sig Dispense Refill  ? acetaminophen (TYLENOL) 325 MG tablet Take 2 tablets (650 mg total) by mouth every 4 (four) hours as needed for mild pain (or temp > 37.5 C (99.5 F)).    ? amLODipine (NORVASC) 10 MG tablet Take 1 tablet (10 mg total) by mouth daily. 30 tablet 0  ? apixaban (ELIQUIS) 5 MG TABS tablet Take 1 tablet (5 mg total) by mouth 2 (two)  times daily. 60 tablet 0  ? atorvastatin (LIPITOR) 40 MG tablet Take 1 tablet (40 mg total) by mouth daily. 30 tablet 0  ? butalbital-acetaminophen-caffeine (FIORICET) 50-325-40 MG tablet Take 1 tablet by mouth every 6 (six) hours as needed for headache. (Patient not taking: Reported on 07/16/2021) 10 tablet 0  ? insulin aspart (NOVOLOG FLEXPEN) 100 UNIT/ML FlexPen Inject 6 Units into the skin 3 (three) times daily with meals. (Patient not taking: Reported on 07/16/2021) 15 mL 11  ? insulin glargine (LANTUS) 100 UNIT/ML Solostar Pen Inject 25 Units into the skin daily. 15 mL 11  ? loratadine (CLARITIN) 10 MG tablet Take 1 tablet (10 mg total) by mouth daily. 30 tablet 0  ? losartan (COZAAR) 50 MG tablet TAKE 1 TABLET(50 MG) BY MOUTH DAILY 30 tablet 0  ? meclizine (ANTIVERT) 12.5 MG tablet Take 1 tablet (12.5 mg total) by mouth 2 (two) times daily as needed for dizziness. (Patient not taking: Reported on 07/16/2021) 30  tablet 0  ? metoprolol succinate (TOPROL-XL) 25 MG 24 hr tablet Take 0.5 tablets (12.5 mg total) by mouth daily. 30 tablet 0  ? oxyCODONE-acetaminophen (PERCOCET) 7.5-325 MG tablet Take 1 tablet by mouth every 4 (four) hours as needed for severe pain. 60 tablet 0  ? pantoprazole (PROTONIX) 40 MG tablet Take 1 tablet (40 mg total) by mouth daily at 6 (six) AM. 30 tablet 0  ? topiramate (TOPAMAX) 100 MG tablet Take 1 tablet (100 mg total) by mouth at bedtime. 30 tablet 0  ? UNABLE TO FIND PLEASE CHECK FASTING BLOOD GLUCOSE EVERY MORNING 1 each 0  ? Vitamin D, Ergocalciferol, (DRISDOL) 1.25 MG (50000 UNIT) CAPS capsule Take 1 capsule (50,000 Units total) by mouth every 7 (seven) days. 5 capsule 0  ? ?No current facility-administered medications for this visit.  ? ? ?Review of Systems ? ?Constitutional: positive for anorexia, fatigue, and weight loss ?Eyes: negative ?Ears, nose, mouth, throat, and face: negative ?Respiratory: positive for cough, dyspnea on exertion, hemoptysis, and pleurisy/chest  pain ?Cardiovascular: negative ?Gastrointestinal: positive for nausea and vomiting ?Genitourinary:negative ?Integument/breast: negative ?Hematologic/lymphatic: negative ?Musculoskeletal:positive for muscle weak

## 2021-07-17 NOTE — Telephone Encounter (Signed)
CALLED PATIENT TO INFORM OF PET SCAN ON 07-24-21 - ARRIVAL TIME- 9 AM @ [facility], PATIENT TO HAVE WATER ONLY- 6 HRS. PRIOR TO TEST, PATIENT TO HOLD LONG ACTING INSULIN @ MIDNIGHT AND PATIENT TO HOLD SHORT ACTING INSULIN - 2 HRS. PRIOR TO SCAN, LVM FOR A RETURN CALL ?

## 2021-07-19 ENCOUNTER — Telehealth: Payer: Self-pay

## 2021-07-19 NOTE — Telephone Encounter (Signed)
Pt's daughter called in with a question about pt's meds. Daughter is just concerned if pt will be able to get meds that were prescribed in hospital filled by pt's pcp. Pt will be running out of meds soon and will be needing to get refills sent in. Pt's daughter would like to speak to nurse about this info. Please advise.  Cb#: Gunnar Bulla [phone]. (Please call after 2:30p if returning call today please)

## 2021-07-19 NOTE — Telephone Encounter (Signed)
Spoke with Micheal Donovan PT w/[facility] [phone]  fax [phone]  Also would request for home health nurse to care :Disease management/wound care/ulcer pressure located on butt   Referral put 07/15/21 for [facility] nurse for wound care   Per Stacy, have not heard any/or receive anything from referrals.   Per Micheal Donovan request if can give verbal orders. Yes, give verbal orders. 07/19/21 for pt to get [facility] nurse for pt's care needs.

## 2021-07-19 NOTE — Telephone Encounter (Signed)
Spoke with daughter, advice her pt typically it is the prescribing provider is the one who would refills his meds (if its from hospital) however, advice that we can always check with PCP to see if willing to refill meds if needed. Daughter voiced understanding. Nothing at this time

## 2021-07-23 ENCOUNTER — Encounter: Payer: Self-pay | Admitting: Radiation Oncology

## 2021-07-23 ENCOUNTER — Encounter: Payer: Self-pay | Admitting: *Deleted

## 2021-07-23 ENCOUNTER — Ambulatory Visit
Admission: RE | Admit: 2021-07-23 | Discharge: 2021-07-23 | Disposition: A | Discharge status: Home / Self Care | Payer: PPO | Source: Ambulatory Visit | Attending: Radiation Oncology | Admitting: Radiation Oncology

## 2021-07-23 ENCOUNTER — Inpatient Hospital Stay: Payer: PPO

## 2021-07-23 ENCOUNTER — Inpatient Hospital Stay: Payer: PPO | Admitting: Oncology

## 2021-07-23 ENCOUNTER — Telehealth: Payer: Self-pay | Admitting: Medical Oncology

## 2021-07-23 ENCOUNTER — Encounter: Payer: Self-pay | Admitting: Oncology

## 2021-07-23 ENCOUNTER — Other Ambulatory Visit: Payer: Self-pay | Admitting: Internal Medicine

## 2021-07-23 VITALS — BP 115/77 | HR 86 | Temp 96.4°F | Wt 134.9 lb

## 2021-07-23 VITALS — BP 115/77 | HR 86 | Temp 96.4°F | Resp 12 | Wt 134.9 lb

## 2021-07-23 DIAGNOSIS — R634 Abnormal weight loss: Secondary | ICD-10-CM | POA: Insufficient documentation

## 2021-07-23 DIAGNOSIS — R630 Anorexia: Secondary | ICD-10-CM | POA: Diagnosis not present

## 2021-07-23 DIAGNOSIS — Z794 Long term (current) use of insulin: Secondary | ICD-10-CM | POA: Insufficient documentation

## 2021-07-23 DIAGNOSIS — R042 Hemoptysis: Secondary | ICD-10-CM | POA: Insufficient documentation

## 2021-07-23 DIAGNOSIS — Z8673 Personal history of transient ischemic attack (TIA), and cerebral infarction without residual deficits: Secondary | ICD-10-CM | POA: Insufficient documentation

## 2021-07-23 DIAGNOSIS — I10 Essential (primary) hypertension: Secondary | ICD-10-CM | POA: Insufficient documentation

## 2021-07-23 DIAGNOSIS — E11319 Type 2 diabetes mellitus with unspecified diabetic retinopathy without macular edema: Secondary | ICD-10-CM | POA: Insufficient documentation

## 2021-07-23 DIAGNOSIS — R059 Cough, unspecified: Secondary | ICD-10-CM | POA: Insufficient documentation

## 2021-07-23 DIAGNOSIS — G44229 Chronic tension-type headache, not intractable: Secondary | ICD-10-CM

## 2021-07-23 DIAGNOSIS — Z79899 Other long term (current) drug therapy: Secondary | ICD-10-CM | POA: Insufficient documentation

## 2021-07-23 DIAGNOSIS — N400 Enlarged prostate without lower urinary tract symptoms: Secondary | ICD-10-CM | POA: Insufficient documentation

## 2021-07-23 DIAGNOSIS — I619 Nontraumatic intracerebral hemorrhage, unspecified: Secondary | ICD-10-CM | POA: Diagnosis not present

## 2021-07-23 DIAGNOSIS — C3412 Malignant neoplasm of upper lobe, left bronchus or lung: Secondary | ICD-10-CM | POA: Insufficient documentation

## 2021-07-23 DIAGNOSIS — I2699 Other pulmonary embolism without acute cor pulmonale: Secondary | ICD-10-CM | POA: Diagnosis not present

## 2021-07-23 DIAGNOSIS — E785 Hyperlipidemia, unspecified: Secondary | ICD-10-CM | POA: Insufficient documentation

## 2021-07-23 DIAGNOSIS — Z7189 Other specified counseling: Secondary | ICD-10-CM | POA: Diagnosis not present

## 2021-07-23 DIAGNOSIS — Z87891 Personal history of nicotine dependence: Secondary | ICD-10-CM | POA: Insufficient documentation

## 2021-07-23 DIAGNOSIS — Z7901 Long term (current) use of anticoagulants: Secondary | ICD-10-CM | POA: Insufficient documentation

## 2021-07-23 MED ORDER — APIXABAN 5 MG PO TABS
5.0000 mg | ORAL_TABLET | Freq: Two times a day (BID) | ORAL | 1 refills | Status: DC
Start: 2021-07-23 — End: 2021-09-06

## 2021-07-23 MED ORDER — LORAZEPAM 0.5 MG PO TABS: ORAL_TABLET | ORAL | 0 refills | Status: DC

## 2021-07-23 MED ORDER — LIDOCAINE-PRILOCAINE 2.5-2.5 % EX CREA: TOPICAL_CREAM | CUTANEOUS | 3 refills | Status: DC

## 2021-07-23 MED ORDER — PROCHLORPERAZINE MALEATE 10 MG PO TABS: 10.0000 mg | ORAL_TABLET | Freq: Four times a day (QID) | ORAL | 1 refills | Status: DC | PRN

## 2021-07-23 MED ORDER — ONDANSETRON HCL 8 MG PO TABS: 8.0000 mg | ORAL_TABLET | Freq: Two times a day (BID) | ORAL | 1 refills | Status: DC | PRN

## 2021-07-23 NOTE — Progress Notes (Signed)
Hematology/Oncology Consult note [phone] [phone]         Patient Care Team: [name], MD as PCP - General (Family Medicine) Valrie Hart, RN as Oncology Nurse Navigator (Oncology) [name], RN as Oncology Nurse Navigator  REFERRING PROVIDER: Curt Bears, MD  CHIEF COMPLAINTS/REASON FOR VISIT:  Evaluation of non-small cell lung cancer  HISTORY OF PRESENTING ILLNESS:   Micheal Donovan is a  71 y.o.  male with PMH listed below was seen in consultation at the request of  Curt Bears, MD  for evaluation of non-small cell lung cancer.  06/20/2021 - 06/27/2021, patient presented to [facility] due to progressive headache/dizziness/gait changes. CT head showed acute to subacute intraparenchymal hemorrhage involving the left cerebellum.  Surrounding low-density vasogenic edema.  Patient was transferred to [facility]. This was further evaluated by CT angiogram of the neck which showed bulky calcified plaques/stenosis of carotid artery, Followed by MRI brain. 06/12/2021, MRI of the brain showed no significant interval change in size of the left cerebellar intraparenchymal hematoma with unchanged regional mass effect and partial effacement of fourth ventricle but no upstream hydrocephalus. There is no discernible enhancement to suggest underlying mass lesion, though acute blood products could mask enhancement.   06/12/2021 a chest x-ray showed a 4.4 cm left middle lobe. 06/13/2021, CT chest with contrast showed a 4.3 x 3.6 x 3.3 cm lobular spiculated mass in the posterior left upper lobe with T3 to the lateral pleura and major fissure.  Metastatic left hilar lymphadenopathy.  Peripheral micronodularity posterior right costophrenic sulcus.  Aortic atherosclerosis. 06/14/2021, CT abdomen pelvis showed right lower lobe pulmonary artery embolus.  No evidence of right heart strain.  No acute intra-abdominal or pelvic pathology.  Aortic  atherosclerosis.  06/13/2021, patient underwent bronchoscopy with EBUS by Dr. Tamala Julian.  Biopsy from the fine-needle aspiration of station 11 mL showed malignant cells, consistent with poorly differentiated non-small cell carcinoma, consistent with adenocarcinoma.  Malignant cells are TTF-1 positive and negative for p40.  Negative for neuroendocrine markers.  Patient was evaluated by radiation oncology and hematology oncology at [facility]. Blood test and tissue sample were tested for [employer]  -PD-L1 TPS 97%, no actionable mutation on the blood testing. 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  Patient is currently on anticoagulation with Eliquis for pulmonary embolism, carotid stenosis.  Patient has a follow-up appointment with neurology as well as repeat MRI brain in June.  Patient reports chronic headache, 5 out of 10, same level comparing to when he was discharged from the hospital.  Not worse.  He has previously tried tramadol which did not help.  He has oxycodone as needed prescription at home which sometimes provides relief.   His mobility is decreased secondary to dizzy spell with position changes and exertion.  + Shortness of breath with exertion occasional hemoptysis.  Appetite is fair. Patient lives at home with her wife.  Wife is currently hospitalized.  Patient's daughter visits patient and provides assistance.  Patient was referred to establish care with our cancer center as patient would like to have treatments closer to home.   He is a former smoker, quit 20 years ago. Patient has a history of melanoma on his neck, treated in 2009.   Review of Systems  Constitutional:  Positive for fatigue. Negative for appetite change, chills, fever and unexpected weight change.  HENT:   Negative for hearing loss and voice change.   Eyes:  Negative for eye problems and icterus.  Respiratory:  Positive for cough and shortness of breath. Negative for chest tightness.    Cardiovascular:  Negative for chest pain and leg swelling.  Gastrointestinal:  Negative for abdominal distention, abdominal pain and blood in stool.  Endocrine: Negative for hot flashes.  Genitourinary:  Negative for difficulty urinating, dysuria and frequency.   Musculoskeletal:  Negative for arthralgias.  Skin:  Negative for itching and rash.  Neurological:  Negative for extremity weakness, light-headedness and numbness.  Hematological:  Negative for adenopathy. Does not bruise/bleed easily.  Psychiatric/Behavioral:  Negative for confusion.    MEDICAL HISTORY:  Past Medical History:  Diagnosis Date   Allergy    BPH (benign prostatic hypertrophy)    Cancer ([place])    Melanoma on Neck    2008   Carotid artery occlusion    Diabetes mellitus    ED (erectile dysfunction)    Hyperlipidemia    Hypertension    Retinopathy due to secondary DM [facility])     SURGICAL HISTORY: Past Surgical History:  Procedure Laterality Date   BRONCHIAL NEEDLE ASPIRATION BIOPSY  06/17/2021   Procedure: BRONCHIAL NEEDLE ASPIRATION BIOPSIES;  Surgeon: Candee Furbish, MD;  Location: [facility] ENDOSCOPY;  Service: Pulmonary;;   MELANOMA EXCISION  2008   Left side of neck   RADIOLOGY WITH ANESTHESIA N/A 04/05/2020   Procedure: MRI SPINE WITOUT CONTRAST;  Surgeon: Radiologist, Medication, MD;  Location: [place];  Service: Radiology;  Laterality: N/A;   TONSILLECTOMY     VIDEO BRONCHOSCOPY WITH ENDOBRONCHIAL ULTRASOUND N/A 06/17/2021   Procedure: VIDEO BRONCHOSCOPY WITH ENDOBRONCHIAL ULTRASOUND;  Surgeon: Candee Furbish, MD;  Location: [facility] ENDOSCOPY;  Service: Pulmonary;  Laterality: N/A;    SOCIAL HISTORY: Social History   Socioeconomic History   Marital status: Married    Spouse name: Not on file   Number of children: Not on file   Years of education: Not on file   Highest education level: Not on file  Occupational History   Not on file  Tobacco Use   Smoking status: Former    Types: Cigarettes    Quit date:  07/21/1990    Years since quitting: 31.0   Smokeless tobacco: Never  Vaping Use   Vaping Use: Never used  Substance and Sexual Activity   Alcohol use: No   Drug use: No   Sexual activity: Not on file  Other Topics Concern   Not on file  Social History Narrative   Not on file   Social Determinants of Health   Financial Resource Strain: Not on file  Food Insecurity: Not on file  Transportation Needs: Not on file  Physical Activity: Not on file  Stress: Not on file  Social Connections: Not on file  Intimate Partner Violence: Not on file    FAMILY HISTORY: Family History  Problem Relation Age of Onset   COPD Mother    Heart disease Father    Heart disease Brother        MI at age 24    ALLERGIES:  is allergic to niaspan [niacin].  MEDICATIONS:  Current Outpatient Medications  Medication Sig Dispense Refill   acetaminophen (TYLENOL) 325 MG tablet Take 2 tablets (650 mg total) by mouth every 4 (four) hours as needed for mild pain (or temp > 37.5 C (99.5 F)).     amLODipine (NORVASC) 10 MG tablet Take 1 tablet (10 mg total) by mouth daily. 30 tablet 0   apixaban (ELIQUIS) 5 MG TABS tablet Take 1 tablet (5 mg total) by  mouth 2 (two) times daily. 60 tablet 0   atorvastatin (LIPITOR) 40 MG tablet Take 1 tablet (40 mg total) by mouth daily. 30 tablet 0   insulin glargine (LANTUS) 100 UNIT/ML Solostar Pen Inject 25 Units into the skin daily. 15 mL 11   loratadine (CLARITIN) 10 MG tablet Take 1 tablet (10 mg total) by mouth daily. 30 tablet 0   losartan (COZAAR) 50 MG tablet TAKE 1 TABLET(50 MG) BY MOUTH DAILY 30 tablet 0   metoprolol succinate (TOPROL-XL) 25 MG 24 hr tablet Take 0.5 tablets (12.5 mg total) by mouth daily. 30 tablet 0   pantoprazole (PROTONIX) 40 MG tablet Take 1 tablet (40 mg total) by mouth daily at 6 (six) AM. 30 tablet 0   potassium chloride SA (KLOR-CON M) 20 MEQ tablet Take 1 tablet (20 mEq total) by mouth daily. 7 tablet 0   topiramate (TOPAMAX) 100 MG  tablet Take 1 tablet (100 mg total) by mouth at bedtime. 30 tablet 0   UNABLE TO FIND PLEASE CHECK FASTING BLOOD GLUCOSE EVERY MORNING 1 each 0   Vitamin D, Ergocalciferol, (DRISDOL) 1.25 MG (50000 UNIT) CAPS capsule Take 1 capsule (50,000 Units total) by mouth every 7 (seven) days. 5 capsule 0   butalbital-acetaminophen-caffeine (FIORICET) 50-325-40 MG tablet Take 1 tablet by mouth every 6 (six) hours as needed for headache. (Patient not taking: Reported on 07/16/2021) 10 tablet 0   insulin aspart (NOVOLOG FLEXPEN) 100 UNIT/ML FlexPen Inject 6 Units into the skin 3 (three) times daily with meals. (Patient not taking: Reported on 07/23/2021) 15 mL 11   LORazepam (ATIVAN) 0.5 MG tablet 1 tablet p.o. 30 minutes before the PET scan.  May repeat x1 if needed. 2 tablet 0   meclizine (ANTIVERT) 12.5 MG tablet Take 1 tablet (12.5 mg total) by mouth 2 (two) times daily as needed for dizziness. (Patient not taking: Reported on 07/23/2021) 30 tablet 0   oxyCODONE-acetaminophen (PERCOCET) 7.5-325 MG tablet Take 1 tablet by mouth every 4 (four) hours as needed for severe pain. (Patient not taking: Reported on 07/23/2021) 60 tablet 0   No current facility-administered medications for this visit.     PHYSICAL EXAMINATION:  Vitals:   07/23/21 1015  BP: 115/77  Pulse: 86  Temp: (!) 96.4 F (35.8 C)  SpO2: 97%   Filed Weights   07/23/21 1015  Weight: 134 lb 14.4 oz (61.2 kg)    Physical Exam Constitutional:      General: He is not in acute distress. HENT:     Head: Normocephalic and atraumatic.  Eyes:     General: No scleral icterus. Cardiovascular:     Rate and Rhythm: Normal rate and regular rhythm.     Heart sounds: Normal heart sounds.  Pulmonary:     Effort: Pulmonary effort is normal. No respiratory distress.     Breath sounds: No wheezing.     Comments: Decreased breath sound bilaterally. Abdominal:     General: Bowel sounds are normal. There is no distension.     Palpations: Abdomen is  soft.  Musculoskeletal:        General: No deformity. Normal range of motion.     Cervical back: Normal range of motion and neck supple.  Skin:    General: Skin is warm and dry.     Findings: No erythema or rash.  Neurological:     Mental Status: He is alert and oriented to person, place, and time. Mental status is at baseline.  Cranial Nerves: No cranial nerve deficit.     Coordination: Coordination normal.  Psychiatric:        Mood and Affect: Mood normal.    LABORATORY DATA:  I have reviewed the data as listed Lab Results  Component Value Date   WBC 10.8 (H) 07/17/2021   HGB 13.4 07/17/2021   HCT 40.1 07/17/2021   MCV 87.0 07/17/2021   PLT 373 07/17/2021   Recent Labs    06/20/21 0319 06/21/21 0505 07/17/21 1345  NA 127* 133* 138  K 4.1 3.8 3.1*  CL 95* 102 104  CO2 _0 GLUCOSE 206* 96 167*  BUN _1 CREATININE 0.71 0.61 0.76  CALCIUM 8.8* 9.2 9.3  GFRNONAA >60 >60 >60  PROT 6.2* 6.7 7.0  ALBUMIN 3.3* 3.2* 3.7  AST _2 ALT _3 ALKPHOS 120 122 123  BILITOT 0.7 0.6 0.6   Iron/TIBC/Ferritin/ %Sat No results found for: IRON, TIBC, FERRITIN, IRONPCTSAT    RADIOGRAPHIC STUDIES: I have personally reviewed the radiological images as listed and agreed with the findings in the report. No results found.    ASSESSMENT & PLAN:  1. Primary non-small cell carcinoma of upper lobe of left lung ([place])   2. Goals of care, counseling/discussion   3. Other acute pulmonary embolism without acute cor pulmonale ([place])   4. Decreased appetite    #Left upper lobe adenocarcinoma, cT2b, cN1 M0 Recommend PET scan to complete staging. Images, pathology reports were reviewed and discussed with patient and daughter in details. Clinically patient has stage IIB disease. I recommend concurrent chemotherapy with carboplatin AUC of 5 with Taxol 45 mg/m2 weekly with radiation. Goals of care is with curative intent. Chemotherapy education was provided.  I  explained to the patient the risks and benefits of chemotherapy including all but not limited to allergic reaction, hair loss, mouth sore, nausea, vomiting, diarrhea, low blood counts, bleeding, neuropathy and risk of life threatening infection and even death, secondary malignancy etc.  . Patient voices understanding and willing to proceed chemotherapy.  Patient has establish care with Dr. Baruch Gouty radiation oncology.  # Chemotherapy education; IR for Medi- port placement. Antiemetics-Zofran and Compazine; EMLA cream sent to pharmacy  #Acute pulmonary embolism, small clot burden, in the setting of malignancy Severe carotid artery stenosis/atherosclerosis, recent hemorrhagic stroke Continue Eliquis 5 mg twice daily.  Refilled patient's prescription.  #Headaches, Continue Fiorcet.  Continue OxyContin 5 mg, recommend every 8 hours as needed for severe headache  #Hypokalemia, patient is on potassium chloride supplementation. #Decreased appetite, some weight loss, refer to nutritionist. . Supportive care measures are necessary for patient well-being and will be provided as necessary. We spent sufficient time to discuss many aspect of care, questions were answered to patient's satisfaction.  Follow-up plan to be determined.  Plan to start chemotherapy on day 1 of 2 radiation.  Orders Placed This Encounter  Procedures   IR IMAGING GUIDED PORT INSERTION    Standing Status:   Future    Standing Expiration Date:   07/24/2022    Order Specific Question:   Reason for Exam (SYMPTOM  OR DIAGNOSIS REQUIRED)    Answer:   IV access for chemotherapy    Order Specific Question:   Preferred Imaging Location?    Answer:   [place]   Ambulatory Referral to [facility] Nutrition    Referral Priority:   Routine    Referral Type:   Consultation    Referral Reason:  Specialty Services Required    Number of Visits Requested:   1    All questions were answered. The patient knows to call the clinic with any  problems questions or concerns.  Thank you for this kind referral and the opportunity to participate in the care of this patient. A copy of today's note is routed to referring provider   [name], MD, [name] [facility] Hematology Oncology 07/23/2021

## 2021-07-23 NOTE — Consult Note (Signed)
NEW PATIENT EVALUATION  Name: Micheal Donovan  MRN: [id number]  Date:   07/23/2021     DOB: 12-06-1950   This 71 y.o. male patient presents to the clinic for initial evaluation of non-small cell lung cancer favoring adenocarcinoma of the left lung awaiting PET for final staging.  REFERRING PHYSICIAN: [name], MD  CHIEF COMPLAINT:  Chief Complaint  Patient presents with   Lung Cancer    DIAGNOSIS: The encounter diagnosis was Malignant neoplasm of upper lobe of left lung ([place]).   PREVIOUS INVESTIGATIONS:  CT scan reviewed Cytology and pathology reviewed Clinical notes reviewed  HPI: Patient is a 71 year old male significant comorbidities recently had a stroke showing acute to subacute intraparenchymal hemorrhage in the left cerebellum.  Part of his work-up he had a CT scan of the chest back 413 showing a 4.3 x 3.6 x 3.3 cm lobular spiculated mass in the posterior left upper lobe highly suggestive of malignancy.  There was also evidence of left hilar adenopathy.  Patient went EBUS with final pathology showing malignant cells poorly differentiated although special stains compatible with adenocarcinoma.  Patient is scheduled for a PET scan tomorrow. He has been seen at [facility] work-up by medical oncology.  His caretakers wife is a former patient mine the requested care in [place] he is seen today for evaluation.  He has since his bronchoscopy some trace hemoptysis has otherwise a nonproductive cough.  He is quite weak.  He also has a 3 35 pound weight loss in the last 3 weeks secondary to his recent CVA. PLANNED TREATMENT REGIMEN: Concurrent chemoradiation  PAST MEDICAL HISTORY:  has a past medical history of Allergy, BPH (benign prostatic hypertrophy), Cancer ([place]), Carotid artery occlusion, Diabetes mellitus, ED (erectile dysfunction), Hyperlipidemia, Hypertension, and Retinopathy due to secondary DM ([place]).    PAST SURGICAL HISTORY:  Past Surgical History:  Procedure Laterality  Date   BRONCHIAL NEEDLE ASPIRATION BIOPSY  06/17/2021   Procedure: BRONCHIAL NEEDLE ASPIRATION BIOPSIES;  Surgeon: Candee Furbish, MD;  Location: [facility] ENDOSCOPY;  Service: Pulmonary;;   MELANOMA EXCISION  2008   Left side of neck   RADIOLOGY WITH ANESTHESIA N/A 04/05/2020   Procedure: MRI SPINE WITOUT CONTRAST;  Surgeon: Radiologist, Medication, MD;  Location: [place];  Service: Radiology;  Laterality: N/A;   TONSILLECTOMY     VIDEO BRONCHOSCOPY WITH ENDOBRONCHIAL ULTRASOUND N/A 06/17/2021   Procedure: VIDEO BRONCHOSCOPY WITH ENDOBRONCHIAL ULTRASOUND;  Surgeon: Candee Furbish, MD;  Location: [facility] ENDOSCOPY;  Service: Pulmonary;  Laterality: N/A;    FAMILY HISTORY: family history includes COPD in his mother; Heart disease in his brother and father.  SOCIAL HISTORY:  reports that he quit smoking about 31 years ago. His smoking use included cigarettes. He has never used smokeless tobacco. He reports that he does not drink alcohol and does not use drugs.  ALLERGIES: Niaspan [niacin]  MEDICATIONS:  Current Outpatient Medications  Medication Sig Dispense Refill   acetaminophen (TYLENOL) 325 MG tablet Take 2 tablets (650 mg total) by mouth every 4 (four) hours as needed for mild pain (or temp > 37.5 C (99.5 F)).     amLODipine (NORVASC) 10 MG tablet Take 1 tablet (10 mg total) by mouth daily. 30 tablet 0   apixaban (ELIQUIS) 5 MG TABS tablet Take 1 tablet (5 mg total) by mouth 2 (two) times daily. 60 tablet 0   atorvastatin (LIPITOR) 40 MG tablet Take 1 tablet (40 mg total) by mouth daily. 30 tablet 0   insulin aspart (  NOVOLOG FLEXPEN) 100 UNIT/ML FlexPen Inject 6 Units into the skin 3 (three) times daily with meals. (Patient not taking: Reported on 07/23/2021) 15 mL 11   insulin glargine (LANTUS) 100 UNIT/ML Solostar Pen Inject 25 Units into the skin daily. 15 mL 11   loratadine (CLARITIN) 10 MG tablet Take 1 tablet (10 mg total) by mouth daily. 30 tablet 0   losartan (COZAAR) 50 MG tablet TAKE 1  TABLET(50 MG) BY MOUTH DAILY 30 tablet 0   metoprolol succinate (TOPROL-XL) 25 MG 24 hr tablet Take 0.5 tablets (12.5 mg total) by mouth daily. 30 tablet 0   pantoprazole (PROTONIX) 40 MG tablet Take 1 tablet (40 mg total) by mouth daily at 6 (six) AM. 30 tablet 0   potassium chloride SA (KLOR-CON M) 20 MEQ tablet Take 1 tablet (20 mEq total) by mouth daily. 7 tablet 0   topiramate (TOPAMAX) 100 MG tablet Take 1 tablet (100 mg total) by mouth at bedtime. 30 tablet 0   UNABLE TO FIND PLEASE CHECK FASTING BLOOD GLUCOSE EVERY MORNING 1 each 0   Vitamin D, Ergocalciferol, (DRISDOL) 1.25 MG (50000 UNIT) CAPS capsule Take 1 capsule (50,000 Units total) by mouth every 7 (seven) days. 5 capsule 0   butalbital-acetaminophen-caffeine (FIORICET) 50-325-40 MG tablet Take 1 tablet by mouth every 6 (six) hours as needed for headache. (Patient not taking: Reported on 07/16/2021) 10 tablet 0   meclizine (ANTIVERT) 12.5 MG tablet Take 1 tablet (12.5 mg total) by mouth 2 (two) times daily as needed for dizziness. (Patient not taking: Reported on 07/23/2021) 30 tablet 0   oxyCODONE-acetaminophen (PERCOCET) 7.5-325 MG tablet Take 1 tablet by mouth every 4 (four) hours as needed for severe pain. (Patient not taking: Reported on 07/23/2021) 60 tablet 0   No current facility-administered medications for this encounter.    ECOG PERFORMANCE STATUS:  2 - Symptomatic, <50% confined to bed  REVIEW OF SYSTEMS: Patient has a history of hypertension diabetes mellitus has had a melanoma of his neck and remote history of smoking. Patient denies any weight loss, fatigue, weakness, fever, chills or night sweats. Patient denies any loss of vision, blurred vision. Patient denies any ringing  of the ears or hearing loss. No irregular heartbeat. Patient denies heart murmur or history of fainting. Patient denies any chest pain or pain radiating to her upper extremities. Patient denies any shortness of breath, difficulty breathing at night,  cough or hemoptysis. Patient denies any swelling in the lower legs. Patient denies any nausea vomiting, vomiting of blood, or coffee ground material in the vomitus. Patient denies any stomach pain. Patient states has had normal bowel movements no significant constipation or diarrhea. Patient denies any dysuria, hematuria or significant nocturia. Patient denies any problems walking, swelling in the joints or loss of balance. Patient denies any skin changes, loss of hair or loss of weight. Patient denies any excessive worrying or anxiety or significant depression. Patient denies any problems with insomnia. Patient denies excessive thirst, polyuria, polydipsia. Patient denies any swollen glands, patient denies easy bruising or easy bleeding. Patient denies any recent infections, allergies or URI. Patient "s visual fields have not changed significantly in recent time.   PHYSICAL EXAM: BP 115/77 (BP Location: Right Arm, Patient Position: Sitting, Cuff Size: Normal)   Pulse 86   Temp (!) 96.4 F (35.8 C) (Tympanic)   Resp 12   Wt 134 lb 14.4 oz (61.2 kg)   SpO2 97%   BMI 21.13 kg/m  Wheelchair-bound male in NAD.  Well-developed well-nourished patient in NAD. HEENT reveals PERLA, EOMI, discs not visualized.  Oral cavity is clear. No oral mucosal lesions are identified. Neck is clear without evidence of cervical or supraclavicular adenopathy. Lungs are clear to A&P. Cardiac examination is essentially unremarkable with regular rate and rhythm without murmur rub or thrill. Abdomen is benign with no organomegaly or masses noted. Motor sensory and DTR levels are equal and symmetric in the upper and lower extremities. Cranial nerves II through XII are grossly intact. Proprioception is intact. No peripheral adenopathy or edema is identified. No motor or sensory levels are noted. Crude visual fields are within normal range.  LABORATORY DATA: Pathology cytology reports reviewed    RADIOLOGY RESULTS: CT scan of the  chest reviewed MRI of the brain was reviewed PET scan to be reviewed after completion tomorrow   IMPRESSION: At least stage II probably stage III adenocarcinoma of the left lung in 71 year old male  PLAN: At this time I will review his PET scan for final staging.  Based on his results would offer concurrent chemoradiation therapy.  Would plan on delivering 70 Gray over 70 weeks to his PET positive area of disease involvement of the left lung and left hilar mediastinal region.  Risks and benefits of treatment occluding increased cough fatigue possible radiation esophagitis skin reaction alteration of blood counts all reviewed in detail with the patient.  He seems to comprehend my treatment plan well.  I have scheduled him for simulation after his PET CT scan.  There will be extra effort by both professional staff as well as technical staff to coordinate and manage concurrent chemoradiation and ensuing side effects during his treatments. Patient comprehends my recommendations well.  I would like to take this opportunity to thank you for allowing me to participate in the care of your patient.Noreene Filbert, MD

## 2021-07-23 NOTE — Telephone Encounter (Signed)
PET scan tomorrow at 0930-Pt claustrophobic  He  was told the mobile PET scanner is small. Dtr requests  " xanax"  To relax him.

## 2021-07-23 NOTE — Progress Notes (Signed)
Met with patient during initial consult with Dr. Baruch Gouty and Dr. Tasia Catchings to discuss diagnosis and recommended treatment options. All questions answered during visit. Reviewed upcoming appts. Contact info given and instructed to call with any questions or needs. Pt verbalized understanding.

## 2021-07-23 NOTE — Progress Notes (Signed)
START ON PATHWAY REGIMEN - Non-Small Cell Lung     Administer weekly:     Paclitaxel      Carboplatin   **Always confirm dose/schedule in your pharmacy ordering system**  Patient Characteristics: Preoperative or Nonsurgical Candidate (Clinical Staging), Stage II, Nonsurgical Candidate Therapeutic Status: Preoperative or Nonsurgical Candidate (Clinical Staging) AJCC T Category: cT2b AJCC N Category: cN1 AJCC M Category: cM0 AJCC 8 Stage Grouping: IIB Intent of Therapy: Curative Intent, Discussed with Patient

## 2021-07-24 ENCOUNTER — Other Ambulatory Visit: Payer: Self-pay | Admitting: Family Medicine

## 2021-07-24 ENCOUNTER — Other Ambulatory Visit: Payer: Self-pay | Admitting: *Deleted

## 2021-07-24 ENCOUNTER — Other Ambulatory Visit: Payer: Self-pay | Admitting: Physical Medicine and Rehabilitation

## 2021-07-24 ENCOUNTER — Telehealth: Payer: Self-pay

## 2021-07-24 ENCOUNTER — Ambulatory Visit
Admission: RE | Admit: 2021-07-24 | Discharge: 2021-07-24 | Disposition: A | Discharge status: Home / Self Care | Payer: PPO | Source: Ambulatory Visit | Attending: Family Medicine | Admitting: Family Medicine

## 2021-07-24 DIAGNOSIS — C7951 Secondary malignant neoplasm of bone: Secondary | ICD-10-CM | POA: Insufficient documentation

## 2021-07-24 DIAGNOSIS — I2510 Atherosclerotic heart disease of native coronary artery without angina pectoris: Secondary | ICD-10-CM | POA: Diagnosis not present

## 2021-07-24 DIAGNOSIS — I619 Nontraumatic intracerebral hemorrhage, unspecified: Secondary | ICD-10-CM | POA: Insufficient documentation

## 2021-07-24 DIAGNOSIS — C3412 Malignant neoplasm of upper lobe, left bronchus or lung: Secondary | ICD-10-CM | POA: Diagnosis not present

## 2021-07-24 DIAGNOSIS — M5137 Other intervertebral disc degeneration, lumbosacral region: Secondary | ICD-10-CM | POA: Insufficient documentation

## 2021-07-24 DIAGNOSIS — I700 Atherosclerosis of aorta: Secondary | ICD-10-CM | POA: Diagnosis not present

## 2021-07-24 DIAGNOSIS — R590 Localized enlarged lymph nodes: Secondary | ICD-10-CM | POA: Diagnosis not present

## 2021-07-24 DIAGNOSIS — J320 Chronic maxillary sinusitis: Secondary | ICD-10-CM | POA: Diagnosis not present

## 2021-07-24 MED ORDER — TOPIRAMATE 100 MG PO TABS: 100.0000 mg | ORAL_TABLET | Freq: Every day | ORAL | 0 refills | Status: DC

## 2021-07-24 MED ORDER — FLUDEOXYGLUCOSE F - 18 (FDG) INJECTION
6.9000 | Freq: Once | INTRAVENOUS | Status: AC | PRN
  Administered 2021-07-24: 7.43 via INTRAVENOUS

## 2021-07-24 NOTE — Telephone Encounter (Signed)
PA initiated through covermymeds for EMLA cream.

## 2021-07-24 NOTE — Telephone Encounter (Signed)
Refilled Topiramate 100 mg

## 2021-07-24 NOTE — Addendum Note (Signed)
Addended by: Casilda Carls on: 07/24/2021 02:03 PM   Modules accepted: Orders

## 2021-07-24 NOTE — Telephone Encounter (Signed)
Received eFax from pharmacy to request refill of  atorvastatin (LIPITOR) 40 MG tablet [[id number]]   Efax received from  [facility] [place], [place] [facility] [place]  [address] [place] [address]  Phone:  [phone]  Fax:  [phone]  DEA #:  [id number]  Please advise pharmacist.

## 2021-07-24 NOTE — Telephone Encounter (Signed)
Pharmacy is sending a request for Topiramate 100 mg.

## 2021-07-24 NOTE — Telephone Encounter (Signed)
PA approved.

## 2021-07-25 ENCOUNTER — Ambulatory Visit
Admission: RE | Admit: 2021-07-25 | Discharge: 2021-07-25 | Disposition: A | Discharge status: Home / Self Care | Payer: PPO | Source: Ambulatory Visit | Attending: Radiation Oncology | Admitting: Radiation Oncology

## 2021-07-25 ENCOUNTER — Encounter: Payer: Self-pay | Admitting: *Deleted

## 2021-07-25 DIAGNOSIS — C3412 Malignant neoplasm of upper lobe, left bronchus or lung: Secondary | ICD-10-CM | POA: Diagnosis not present

## 2021-07-25 DIAGNOSIS — Z510 Encounter for antineoplastic radiation therapy: Secondary | ICD-10-CM | POA: Insufficient documentation

## 2021-07-25 DIAGNOSIS — Z87891 Personal history of nicotine dependence: Secondary | ICD-10-CM | POA: Diagnosis not present

## 2021-07-25 NOTE — Telephone Encounter (Signed)
Patient's daughter [name] came to office to drop off [place]. Placed in folder at desk of [place].  Also requesting for refills of the following meds:  amLODipine (NORVASC) 10 MG tablet [[id number]]   atorvastatin (LIPITOR) 40 MG tablet [[id number]]   losartan (COZAAR) 50 MG tablet [[id number]]   metoprolol succinate (TOPROL-XL) 25 MG 24 hr tablet [[id number]]   Pharmacy:   [facility] [place], [place] AT [place]  [address] [place] [address]  Phone:  [phone]  Fax:  [phone]  DEA #:  [id number]  [name] requesting call back; wants to know if provider will honor refill requests without an appointment. States patient has enough medication to last until Tuesday.  Please advise at [phone]

## 2021-07-25 NOTE — Telephone Encounter (Signed)
duplicate request- attached to original request

## 2021-07-25 NOTE — Progress Notes (Signed)
Patient on schedule for Port placement 6/1, called and spoke with patient on phone with pre procedure instructions given. Made aware to be here at 1230, NPO after 0630, and driver post procedure/recovery/discharge. Stated understanding.

## 2021-07-25 NOTE — Telephone Encounter (Signed)
Requested medication (s) are due for refill today - yes  Requested medication (s) are on the active medication list -yes  Future visit scheduled -no  Last refill: 06/26/21- hospital encounter  Notes to clinic: all prescribed by outside provider- sent for PCP review   Requested Prescriptions  Pending Prescriptions Disp Refills   atorvastatin (LIPITOR) 40 MG tablet 30 tablet 0    Sig: Take 1 tablet (40 mg total) by mouth daily.     Cardiovascular:  Antilipid - Statins Failed - 07/25/2021 12:52 PM      Failed - Lipid Panel in normal range within the last 12 months    Cholesterol  Date Value Ref Range Status  06/12/2021 266 (H) 0 - 200 mg/dL Final   LDL Cholesterol (Calc)  Date Value Ref Range Status  04/29/2017 81 mg/dL (calc) Final    Comment:    Reference range: <100 . Desirable range <100 mg/dL for primary prevention;   <70 mg/dL for patients with CHD or diabetic patients  with > or = 2 CHD risk factors. [name] LDL-C is now calculated using the Martin-Hopkins  calculation, which is a validated novel method providing  better accuracy than the Friedewald equation in the  estimation of LDL-C.  Cresenciano Genre et al. Annamaria Helling. 2831;517(61): 2061-2068  (http://education.[facility].com/faq/FAQ164)    LDL Cholesterol  Date Value Ref Range Status  06/12/2021 213 (H) 0 - 99 mg/dL Final    Comment:           Total Cholesterol/HDL:CHD Risk Coronary Heart Disease Risk Table                     Men   Women  1/2 Average Risk   3.4   3.3  Average Risk       5.0   4.4  2 X Average Risk   9.6   7.1  3 X Average Risk  23.4   11.0        Use the calculated Patient Ratio above and the CHD Risk Table to determine the patient's CHD Risk.        ATP III CLASSIFICATION (LDL):  <100     mg/dL   Optimal  100-129  mg/dL   Near or Above                    Optimal  130-159  mg/dL   Borderline  160-189  mg/dL   High  >190     mg/dL   Very High Performed at [facility] [address], [place], [place] [address]    HDL  Date Value Ref Range Status  06/12/2021 24 (L) >40 mg/dL Final   Triglycerides  Date Value Ref Range Status  06/12/2021 144 <150 mg/dL Final         Passed - Patient is not pregnant      Passed - Valid encounter within last 12 months    Recent Outpatient Visits           2 weeks ago Uncontrolled type 2 diabetes mellitus with hypoglycemia, unspecified hypoglycemia coma status ([place])   [place] Pickard, Cammie Mcgee, MD   1 month ago Uncontrolled type 2 diabetes mellitus with hypoglycemia, unspecified hypoglycemia coma status ([place])   [place] Pickard, Cammie Mcgee, MD   2 years ago [place]   [place], Cammie Mcgee, MD   4 years ago Encounter for hepatitis C screening test for  low risk patient   [place] Dennard Schaumann, Cammie Mcgee, MD   4 years ago Type 2 diabetes mellitus without complication, with long-term current use of insulin ([place])   [place], Modena Nunnery, MD       Future Appointments             In 1 week Ward Givens, NP [facility]   In 2 months Raulkar, Clide Deutscher, MD [facility], [facility]              amLODipine (NORVASC) 10 MG tablet 30 tablet 0    Sig: Take 1 tablet (10 mg total) by mouth daily.     There is no refill protocol information for this order     losartan (COZAAR) 50 MG tablet 30 tablet 0    Sig: TAKE 1 TABLET(50 MG) BY MOUTH DAILY     There is no refill protocol information for this order     metoprolol succinate (TOPROL-XL) 25 MG 24 hr tablet 30 tablet 0    Sig: Take 0.5 tablets (12.5 mg total) by mouth daily.     There is no refill protocol information for this order       Requested Prescriptions  Pending Prescriptions Disp Refills   atorvastatin (LIPITOR) 40 MG tablet 30 tablet 0    Sig: Take 1 tablet (40 mg total) by mouth daily.     Cardiovascular:   Antilipid - Statins Failed - 07/25/2021 12:52 PM      Failed - Lipid Panel in normal range within the last 12 months    Cholesterol  Date Value Ref Range Status  06/12/2021 266 (H) 0 - 200 mg/dL Final   LDL Cholesterol (Calc)  Date Value Ref Range Status  04/29/2017 81 mg/dL (calc) Final    Comment:    Reference range: <100 . Desirable range <100 mg/dL for primary prevention;   <70 mg/dL for patients with CHD or diabetic patients  with > or = 2 CHD risk factors. [name] LDL-C is now calculated using the Martin-Hopkins  calculation, which is a validated novel method providing  better accuracy than the Friedewald equation in the  estimation of LDL-C.  Cresenciano Genre et al. Annamaria Helling. 4259;563(87): 2061-2068  (http://education.[facility].com/faq/FAQ164)    LDL Cholesterol  Date Value Ref Range Status  06/12/2021 213 (H) 0 - 99 mg/dL Final    Comment:           Total Cholesterol/HDL:CHD Risk Coronary Heart Disease Risk Table                     Men   Women  1/2 Average Risk   3.4   3.3  Average Risk       5.0   4.4  2 X Average Risk   9.6   7.1  3 X Average Risk  23.4   11.0        Use the calculated Patient Ratio above and the CHD Risk Table to determine the patient's CHD Risk.        ATP III CLASSIFICATION (LDL):  <100     mg/dL   Optimal  100-129  mg/dL   Near or Above                    Optimal  130-159  mg/dL   Borderline  160-189  mg/dL   High  >190     mg/dL   Very  High Performed at [facility], [place] [address] [place], [place] [address]    HDL  Date Value Ref Range Status  06/12/2021 24 (L) >40 mg/dL Final   Triglycerides  Date Value Ref Range Status  06/12/2021 144 <150 mg/dL Final         Passed - Patient is not pregnant      Passed - Valid encounter within last 12 months    Recent Outpatient Visits           2 weeks ago Uncontrolled type 2 diabetes mellitus with hypoglycemia, unspecified hypoglycemia coma status ([place])   [place] Pickard, Cammie Mcgee, MD   1 month ago Uncontrolled type 2 diabetes mellitus with hypoglycemia, unspecified hypoglycemia coma status ([place])   [place] Pickard, Cammie Mcgee, MD   2 years ago [place]   [place] Dennard Schaumann, Cammie Mcgee, MD   4 years ago Encounter for hepatitis C screening test for low risk patient   [place] [name], MD   4 years ago Type 2 diabetes mellitus without complication, with long-term current use of insulin ([place])   [place], Modena Nunnery, MD       Future Appointments             In 1 week Ward Givens, NP [facility]   In 2 months Raulkar, Clide Deutscher, MD [facility], [facility]              amLODipine (NORVASC) 10 MG tablet 30 tablet 0    Sig: Take 1 tablet (10 mg total) by mouth daily.     There is no refill protocol information for this order     losartan (COZAAR) 50 MG tablet 30 tablet 0    Sig: TAKE 1 TABLET(50 MG) BY MOUTH DAILY     There is no refill protocol information for this order     metoprolol succinate (TOPROL-XL) 25 MG 24 hr tablet 30 tablet 0    Sig: Take 0.5 tablets (12.5 mg total) by mouth daily.     There is no refill protocol information for this order

## 2021-07-26 ENCOUNTER — Other Ambulatory Visit: Payer: Self-pay | Admitting: Oncology

## 2021-07-26 ENCOUNTER — Encounter: Payer: Self-pay | Admitting: Oncology

## 2021-07-26 ENCOUNTER — Encounter: Payer: Self-pay | Admitting: *Deleted

## 2021-07-26 LAB — GLUCOSE, CAPILLARY: Glucose-Capillary: 155 mg/dL — ABNORMAL HIGH (ref 70–99)

## 2021-07-26 MED ORDER — PANTOPRAZOLE SODIUM 40 MG PO TBEC
40.0000 mg | DELAYED_RELEASE_TABLET | Freq: Every day | ORAL | 0 refills | Status: DC
Start: 2021-07-26 — End: 2022-01-01

## 2021-07-26 NOTE — Progress Notes (Signed)
Oncology Nurse Navigator Documentation     07/26/2021    3:00 PM 07/26/2021   11:00 AM 07/23/2021   11:00 AM 06/19/2021    1:00 PM 06/19/2021   10:00 AM  Oncology Nurse Navigator Flowsheets  Abnormal Finding Date   06/13/2021    Confirmed Diagnosis Date   06/19/2021    Diagnosis Status   Confirmed Diagnosis Complete    Planned Course of Treatment   Chemo/Radiation Concurrent    Navigator Follow Up Date:  07/30/2021 07/25/2021  06/26/2021  Navigator Follow Up Reason:  Chemo Class Appointment Review  Other:  Navigation Complete Date: 07/26/2021      Post Navigation: Continue to Follow Patient? No      Reason Not Navigating Patient: Seeking Care elsewhere      Navigator Location [place]  Referral Date to RadOnc/MedOnc   07/17/2021  06/18/2021  Navigator Encounter Type Appt/Treatment Plan Review MyChart Initial MedOnc;Initial RadOnc Molecular Studies Other:  Patient Visit Type    Inpatient Inpatient  Treatment Phase  CT SIM Pre-Tx/Tx Discussion Pre-Tx/Tx Discussion Pre-Tx/Tx Discussion  Barriers/Navigation Needs Coordination of Care/patient getting tx [facility] Coordination of Care Coordination of Care;Morbidities/Frailty Coordination of Care Coordination of Care  Interventions Coordination of Care Coordination of Care Coordination of Care;Referrals Coordination of Care Coordination of Care  Acuity Level 2-Minimal Needs (1-2 Barriers Identified)  Level 2-Minimal Needs (1-2 Barriers Identified) Level 2-Minimal Needs (1-2 Barriers Identified) Level 2-Minimal Needs (1-2 Barriers Identified)  Referrals   Nutrition/dietician    Coordination of Care Other Appts;Chemo Appts;Chemo Pathology Other  Time Spent with Patient [phone]

## 2021-07-27 ENCOUNTER — Other Ambulatory Visit: Payer: Self-pay | Admitting: Internal Medicine

## 2021-07-30 ENCOUNTER — Inpatient Hospital Stay: Payer: PPO

## 2021-07-30 ENCOUNTER — Telehealth: Payer: Self-pay

## 2021-07-30 ENCOUNTER — Encounter: Payer: Self-pay | Admitting: *Deleted

## 2021-07-30 DIAGNOSIS — C3490 Malignant neoplasm of unspecified part of unspecified bronchus or lung: Secondary | ICD-10-CM | POA: Diagnosis not present

## 2021-07-30 DIAGNOSIS — C3412 Malignant neoplasm of upper lobe, left bronchus or lung: Secondary | ICD-10-CM

## 2021-07-30 DIAGNOSIS — L89159 Pressure ulcer of sacral region, unspecified stage: Secondary | ICD-10-CM

## 2021-07-30 MED ORDER — VITAMIN D (ERGOCALCIFEROL) 1.25 MG (50000 UNIT) PO CAPS: 50000.0000 [IU] | ORAL_CAPSULE | ORAL | 0 refills | Status: DC

## 2021-07-30 NOTE — Progress Notes (Signed)
Met with pt's daughter after chemo class to review upcoming appts and answer any questions. Pt's daughter voiced concerns about caring for pt at home without many resources to help. Informed that may place referral to social work to address further resources available. Also, pt has a pressure ulcer to his sacral area that is not being well managed by home health and they are concerned this may a source of infection if left untreated while pt goes through treatments for lung cancer. Informed that will place referral to the wound care center to see if they can help manage and treat the pressure ulcer. Pt continues to have decreased appetite, weight loss, and possible feelings of depression. Per family, pt is not very open to discussing feelings of depression but may be more open with male provider. Informed that can schedule pt to see Josh at his next visit to further discuss goals of care and symptom management. Pt's daughter voiced concern about pt running out of prescriptions that PCP has not been refilled at this time. She plans to discuss prescription refills with neurologist next week. Instructed that Dr. Tasia Catchings may be able to refill prescriptions temporarily until can be seen by provider. Encouraged to check BP at home as well and keep daily log to review at appts.   All questions answered during visit. Reassurance provided and encouraged to call with any questions or needs. Pt's daughter verbalized understanding.

## 2021-07-30 NOTE — Telephone Encounter (Signed)
Pharmacy sent a fax requesting Vitamin D2. Would you like to refill?

## 2021-07-30 NOTE — Addendum Note (Signed)
Addended by: Casilda Carls on: 07/30/2021 10:22 AM   Modules accepted: Orders

## 2021-07-30 NOTE — Progress Notes (Signed)
Pharmacist Chemotherapy Monitoring - Initial Assessment    Anticipated start date: 08/06/21   The following has been reviewed per standard work regarding the patient's treatment regimen: The patient's diagnosis, treatment plan and drug doses, and organ/hematologic function Lab orders and baseline tests specific to treatment regimen  The treatment plan start date, drug sequencing, and pre-medications Prior authorization status  Patient's documented medication list, including drug-drug interaction screen and prescriptions for anti-emetics and supportive care specific to the treatment regimen The drug concentrations, fluid compatibility, administration routes, and timing of the medications to be used The patient's access for treatment and lifetime cumulative dose history, if applicable  The patient's medication allergies and previous infusion related reactions, if applicable   Changes made to treatment plan:  treatment plan date  Follow up needed:  [place], [facility], 07/30/2021  9:16 AM

## 2021-07-31 ENCOUNTER — Other Ambulatory Visit: Payer: Self-pay | Admitting: Radiology

## 2021-07-31 ENCOUNTER — Inpatient Hospital Stay: Payer: PPO | Admitting: Licensed Clinical Social Worker

## 2021-07-31 ENCOUNTER — Inpatient Hospital Stay: Payer: PPO | Admitting: Adult Health

## 2021-07-31 DIAGNOSIS — C3412 Malignant neoplasm of upper lobe, left bronchus or lung: Secondary | ICD-10-CM

## 2021-07-31 NOTE — Progress Notes (Signed)
[place] Work  Initial Assessment   Micheal Donovan is a 71 y.o. year old male contacted caregiver by phone. Clinical Social Work was referred by nurse navigator for assessment of psychosocial needs.   SDOH (Social Determinants of Health) assessments performed: Yes SDOH Interventions    Flowsheet Row Most Recent Value  SDOH Interventions   Food Insecurity Interventions Intervention Not Indicated  Financial Strain Interventions Intervention Not Indicated  Housing Interventions Intervention Not Indicated  Physical Activity Interventions Intervention Not Indicated  Social Connections Interventions Intervention Not Indicated  Transportation Interventions [facility] Van ([place]. Only), Patient Resources Tax adviser)       SDOH Screenings   Alcohol Screen: Low Risk    Last Alcohol Screening Score (AUDIT): 0  Depression (PHQ2-9): Low Risk    PHQ-2 Score: 0  Financial Resource Strain: Low Risk    Difficulty of Paying Living Expenses: Not very hard  Food Insecurity: No Food Insecurity   Worried About Charity fundraiser in the Last Year: Never true   Ran Out of Food in the Last Year: Never true  Housing: Low Risk    Last Housing Risk Score: 0  Physical Activity: Inactive   Days of Exercise per Week: 0 days   Minutes of Exercise per Session: 0 min  Social Connections: Unknown   Frequency of Communication with Friends and Family: Three times a week   Frequency of Social Gatherings with Friends and Family: Three times a week   Attends Religious Services: Patient refused   Active Member of Clubs or Organizations: Patient refused   Attends Archivist Meetings: Patient refused   Marital Status: Married  Stress: Unknown   Feeling of Stress : Patient refused  Tobacco Use: Medium Risk   Smoking Tobacco Use: Former   Smokeless Tobacco Use: Never   Passive Exposure: Not on Pensions consultant Needs: No Transportation Needs   Lack of Transportation  (Medical): No   Lack of Transportation (Non-Medical): No     Distress Screen completed: Yes    07/23/2021    9:33 AM  ONCBCN DISTRESS SCREENING  Distress experienced in past week (1-10) 7  Physical Problem type Pain  Physician notified of physical symptoms Yes      Family/Social Information:  Housing Arrangement: patient lives with spouse Tavonte Seybold,  primary care giver, daughter Hamilton Sink [phone] Family members/support persons in your life? Family, Friends, Neighbors, and Geophysical data processor concerns: yes, patient's daughter is transporting home to appointments but she works full-time, she would like to find out if he would be eligible for transportation services to take him to [facility] appointments.  Employment: Retired .[name]  Income source: Paediatric nurse concerns: No Type of concern: Care giving (Child care or elder care services) Food access concerns: no Religious or spiritual practice: Not known Services Currently in place:  [employer] PPO  Coping/ Adjustment to diagnosis: Patient understands treatment plan and what happens next? yes Concerns about diagnosis and/or treatment: Pain or discomfort during procedures, How I will care for other members of my family, and How I will pay for the services I need Patient reported stressors: Transportation, Childcare/ elder care, and Partner Hopes and/or priorities: N/A Patient enjoys  N/A Current coping skills/ strengths: Capable of independent living , Armed forces logistics/support/administrative officer , Scientist, research (life sciences) , and Supportive family/friends     SUMMARY: Current SDOH Barriers:  Financial constraints related to fixed income, Transportation, Level of care concerns, and care giving needs at home  Clinical Social Work Clinical Goal(s):  Patient will follow up with transportation for transportation assistance* as directed by SW  Interventions: Discussed common feeling and emotions when being diagnosed with  cancer, and the importance of support during treatment Informed patient of the support team roles and support services at [facility] Provided CSW contact information and encouraged patient to call with any questions or concerns Provided patient with information about CSW role in patient care and available resources and emailed Ms. Pace list of private duty care givers.  Recommended installing safety features in the home to prevent falls and monitors for assistance if patient falls and is alone at home.  Email [email or web address]   Follow Up Plan: Patient will contact CSW with any support or resource needs and Patient will request transportation assistance for appointments Patient verbalizes understanding of plan: Yes    Elpidia Karn, LCSW

## 2021-08-01 ENCOUNTER — Other Ambulatory Visit: Payer: Self-pay | Admitting: Student

## 2021-08-01 ENCOUNTER — Other Ambulatory Visit: Payer: Self-pay | Admitting: *Deleted

## 2021-08-01 ENCOUNTER — Encounter: Payer: Self-pay | Admitting: Radiology

## 2021-08-01 ENCOUNTER — Ambulatory Visit
Admission: RE | Admit: 2021-08-01 | Discharge: 2021-08-01 | Disposition: A | Discharge status: Home / Self Care | Payer: PPO | Source: Ambulatory Visit | Attending: Oncology | Admitting: Oncology

## 2021-08-01 ENCOUNTER — Encounter (HOSPITAL_COMMUNITY): Payer: Self-pay

## 2021-08-01 DIAGNOSIS — L89611 Pressure ulcer of right heel, stage 1: Secondary | ICD-10-CM | POA: Diagnosis not present

## 2021-08-01 DIAGNOSIS — C3412 Malignant neoplasm of upper lobe, left bronchus or lung: Secondary | ICD-10-CM

## 2021-08-01 DIAGNOSIS — E785 Hyperlipidemia, unspecified: Secondary | ICD-10-CM | POA: Diagnosis not present

## 2021-08-01 DIAGNOSIS — L89150 Pressure ulcer of sacral region, unstageable: Secondary | ICD-10-CM | POA: Diagnosis not present

## 2021-08-01 DIAGNOSIS — Z794 Long term (current) use of insulin: Secondary | ICD-10-CM | POA: Diagnosis not present

## 2021-08-01 DIAGNOSIS — Z510 Encounter for antineoplastic radiation therapy: Secondary | ICD-10-CM | POA: Diagnosis not present

## 2021-08-01 DIAGNOSIS — I614 Nontraumatic intracerebral hemorrhage in cerebellum: Secondary | ICD-10-CM | POA: Diagnosis not present

## 2021-08-01 DIAGNOSIS — E1165 Type 2 diabetes mellitus with hyperglycemia: Secondary | ICD-10-CM | POA: Diagnosis not present

## 2021-08-01 DIAGNOSIS — I2699 Other pulmonary embolism without acute cor pulmonale: Secondary | ICD-10-CM | POA: Diagnosis not present

## 2021-08-01 DIAGNOSIS — I6529 Occlusion and stenosis of unspecified carotid artery: Secondary | ICD-10-CM | POA: Diagnosis not present

## 2021-08-01 DIAGNOSIS — R918 Other nonspecific abnormal finding of lung field: Secondary | ICD-10-CM | POA: Diagnosis not present

## 2021-08-01 DIAGNOSIS — Z7901 Long term (current) use of anticoagulants: Secondary | ICD-10-CM | POA: Diagnosis not present

## 2021-08-01 DIAGNOSIS — Z87891 Personal history of nicotine dependence: Secondary | ICD-10-CM | POA: Diagnosis not present

## 2021-08-01 MED ORDER — LIDOCAINE-EPINEPHRINE 1 %-1:100000 IJ SOLN
INTRAMUSCULAR | Status: AC
  Filled 2021-08-01: qty 1

## 2021-08-01 MED ORDER — SODIUM CHLORIDE 0.9 % IV SOLN: INTRAVENOUS | Status: DC

## 2021-08-01 MED ORDER — HEPARIN SOD (PORK) LOCK FLUSH 100 UNIT/ML IV SOLN
INTRAVENOUS | Status: AC
  Filled 2021-08-01: qty 5

## 2021-08-04 NOTE — Progress Notes (Unsigned)
PATIENT: Micheal Donovan DOB: [birth year]  REASON FOR VISIT: follow up HISTORY FROM: patient PRIMARY NEUROLOGIST: Dr. Leonie Man  Chief Complaint  Patient presents with   Follow-up    Pt in 5 with daughter  pt is here for stroke follow up  Pt states he is getting headaches everyday  Pt is currently in wheel chair because his gait is off . Pt states he uses a walker also      HISTORY OF PRESENT ILLNESS: Today 08/04/21  HISTORY Micheal Donovan is a 71 y.o. male with history of  uncontrolled diabetes, hypertension, hyperlipidemia, retinopathy from diabetes, based on carotid ultrasound 2014-bilateral ICA stenosis 60 to 79%, presented to the emergency room at [facility] for evaluation of a headache that had been going on for 1 week.  He reports that he took some aspirin for his headache and that his blood sugar and blood pressure have been abnormally high this week.  MRI shows no significant change in the size of the IPH, unchanged regional mass effect.  Carotid Dopplers are pending   Stroke: hemorrhagic infarct left cerebellum, likely small vs. Large vessel disease given multiple uncontrolled risk factors Code Stroke CT head Acute to subacute IPH involving the left cerebellum (estimated volume 19 mL). Surrounding low-density vasogenic edema with mild regional mass effect. Partial effacement of the fourth ventricle which remains patent at this time. No hydrocephalus or transtentorial herniation. CTA head & neck- No LVO, Bulky calcified plaque about the left carotid bulb/proximal cervical left ICA with associated stenosis of up to 75%.  MRI  No significant interval change in size of the left cerebellar intraparenchymal hematoma with unchanged regional mass effect and partial effacement of fourth ventricle but no upstream hydrocephalus.  CT repeat 4/19 - Slight contraction of the LEFT cerebellar intraparenchymal Hemorrhage, no hydro Carotid Doppler 60 to 79% left ICA   2D Echo EF 55-60%   LDL 213 HgbA1c 13.6 VTE prophylaxis - heparin IV aspirin 81 mg daily prior to admission, now on heparin IV low intensity given right PE. Therapy recommendations: CIR  Today: During hospitalization the patient was found to have adenocarcinoma of the LUL lung.  He also reports that he has a spot on his esophagus.  He will be starting chemo and radiation.  He is having his port placed today and therapy starts tomorrow.  He reports he is having daily dull headaches.  Reports that Tylenol is not helpful.  He was given topiramate but also did not find that helpful.  He is also try Fioricet without benefit.  His PCP recently gave him Percocet.  He reports he tries to use this on the rare occasion.  Reports that he is not sleeping very well.  Has a repeat MRI brain scheduled June 22.  He reports that his glucose has been back in normal range. Reports blood sugar of 100 in the morning and 150 at night. He is now on Eliquis.  Reports that blood pressure has been in normal range.  Reports that his oncologist recently refilled his medicine.  His daughter states that they called the primary care provider but was told by the receptionist or CMA that they would not refill since they did not start the medication.  However this is a medication that was started during his hospitalization I have reached out to Dr. Dennard Schaumann to see if he will be managing the patient's blood pressure and cholesterol.  Waiting to hear back  Reports that his balance is  slowly getting better. Uses a walker at home   REVIEW OF SYSTEMS: Out of a complete 14 system review of symptoms, the patient complains only of the following symptoms, and all other reviewed systems are negative.  ALLERGIES: Allergies  Allergen Reactions   Niaspan [Niacin]     FLUSHING    HOME MEDICATIONS: Outpatient Medications Prior to Visit  Medication Sig Dispense Refill   acetaminophen (TYLENOL) 325 MG tablet Take 2 tablets (650 mg total) by mouth every 4  (four) hours as needed for mild pain (or temp > 37.5 C (99.5 F)).     amLODipine (NORVASC) 10 MG tablet Take 1 tablet (10 mg total) by mouth daily. 30 tablet 0   apixaban (ELIQUIS) 5 MG TABS tablet Take 1 tablet (5 mg total) by mouth 2 (two) times daily. 60 tablet 1   atorvastatin (LIPITOR) 40 MG tablet Take 1 tablet (40 mg total) by mouth daily. 30 tablet 0   butalbital-acetaminophen-caffeine (FIORICET) 50-325-40 MG tablet Take 1 tablet by mouth every 6 (six) hours as needed for headache. (Patient not taking: Reported on 07/16/2021) 10 tablet 0   insulin aspart (NOVOLOG FLEXPEN) 100 UNIT/ML FlexPen Inject 6 Units into the skin 3 (three) times daily with meals. (Patient not taking: Reported on 07/23/2021) 15 mL 11   insulin glargine (LANTUS) 100 UNIT/ML Solostar Pen Inject 25 Units into the skin daily. 15 mL 11   lidocaine-prilocaine (EMLA) cream Apply to affected area once 30 g 3   loratadine (CLARITIN) 10 MG tablet Take 1 tablet (10 mg total) by mouth daily. 30 tablet 0   LORazepam (ATIVAN) 0.5 MG tablet 1 tablet p.o. 30 minutes before the PET scan.  May repeat x1 if needed. 2 tablet 0   losartan (COZAAR) 50 MG tablet TAKE 1 TABLET(50 MG) BY MOUTH DAILY 30 tablet 0   meclizine (ANTIVERT) 12.5 MG tablet Take 1 tablet (12.5 mg total) by mouth 2 (two) times daily as needed for dizziness. (Patient not taking: Reported on 07/23/2021) 30 tablet 0   metoprolol succinate (TOPROL-XL) 25 MG 24 hr tablet Take 0.5 tablets (12.5 mg total) by mouth daily. 30 tablet 0   ondansetron (ZOFRAN) 8 MG tablet Take 1 tablet (8 mg total) by mouth 2 (two) times daily as needed for refractory nausea / vomiting. Start on day 3 after chemo. 30 tablet 1   oxyCODONE-acetaminophen (PERCOCET) 7.5-325 MG tablet Take 1 tablet by mouth every 4 (four) hours as needed for severe pain. (Patient not taking: Reported on 07/23/2021) 60 tablet 0   pantoprazole (PROTONIX) 40 MG tablet Take 1 tablet (40 mg total) by mouth daily at 6 (six) AM. 90  tablet 0   potassium chloride SA (KLOR-CON M) 20 MEQ tablet Take 1 tablet (20 mEq total) by mouth daily. 7 tablet 0   prochlorperazine (COMPAZINE) 10 MG tablet Take 1 tablet (10 mg total) by mouth every 6 (six) hours as needed (Nausea or vomiting). 30 tablet 1   topiramate (TOPAMAX) 100 MG tablet Take 1 tablet (100 mg total) by mouth at bedtime. 30 tablet 0   UNABLE TO FIND PLEASE CHECK FASTING BLOOD GLUCOSE EVERY MORNING 1 each 0   Vitamin D, Ergocalciferol, (DRISDOL) 1.25 MG (50000 UNIT) CAPS capsule Take 1 capsule (50,000 Units total) by mouth every 7 (seven) days. 5 capsule 0   No facility-administered medications prior to visit.    PAST MEDICAL HISTORY: Past Medical History:  Diagnosis Date   Allergy    BPH (benign prostatic hypertrophy)  Cancer ([place])    Melanoma on Neck    2008   Carotid artery occlusion    Diabetes mellitus    ED (erectile dysfunction)    Hyperlipidemia    Hypertension    Retinopathy due to secondary DM ([place])     PAST SURGICAL HISTORY: Past Surgical History:  Procedure Laterality Date   BRONCHIAL NEEDLE ASPIRATION BIOPSY  06/17/2021   Procedure: BRONCHIAL NEEDLE ASPIRATION BIOPSIES;  Surgeon: Candee Furbish, MD;  Location: [facility] ENDOSCOPY;  Service: Pulmonary;;   MELANOMA EXCISION  2008   Left side of neck   RADIOLOGY WITH ANESTHESIA N/A 04/05/2020   Procedure: MRI SPINE WITOUT CONTRAST;  Surgeon: Radiologist, Medication, MD;  Location: [facility];  Service: Radiology;  Laterality: N/A;   TONSILLECTOMY     VIDEO BRONCHOSCOPY WITH ENDOBRONCHIAL ULTRASOUND N/A 06/17/2021   Procedure: VIDEO BRONCHOSCOPY WITH ENDOBRONCHIAL ULTRASOUND;  Surgeon: Candee Furbish, MD;  Location: [facility] ENDOSCOPY;  Service: Pulmonary;  Laterality: N/A;    FAMILY HISTORY: Family History  Problem Relation Age of Onset   COPD Mother    Heart disease Father    Heart disease Brother        MI at age 71    SOCIAL HISTORY: Social History   Socioeconomic History   Marital status:  Married    Spouse name: Not on file   Number of children: Not on file   Years of education: Not on file   Highest education level: Not on file  Occupational History   Not on file  Tobacco Use   Smoking status: Former    Types: Cigarettes    Quit date: 07/21/1990    Years since quitting: 31.0   Smokeless tobacco: Never  Vaping Use   Vaping Use: Never used  Substance and Sexual Activity   Alcohol use: No   Drug use: No   Sexual activity: Not on file  Other Topics Concern   Not on file  Social History Narrative   Not on file   Social Determinants of Health   Financial Resource Strain: Low Risk    Difficulty of Paying Living Expenses: Not very hard  Food Insecurity: No Food Insecurity   Worried About Running Out of Food in the Last Year: Never true   [place] in the Last Year: Never true  Transportation Needs: No Transportation Needs   Lack of Transportation (Medical): No   Lack of Transportation (Non-Medical): No  Physical Activity: Inactive   Days of Exercise per Week: 0 days   Minutes of Exercise per Session: 0 min  Stress: Unknown   Feeling of Stress : Patient refused  Social Connections: Unknown   Frequency of Communication with Friends and Family: Three times a week   Frequency of Social Gatherings with Friends and Family: Three times a week   Attends Religious Services: Patient refused   Active Member of Clubs or Organizations: Patient refused   Attends Archivist Meetings: Patient refused   Marital Status: Married  Human resources officer Violence: Not At Risk   Fear of Current or Ex-Partner: No   Emotionally Abused: No   Physically Abused: No   Sexually Abused: No      PHYSICAL EXAM  Vitals:   08/05/21 0830  BP: 104/66  Pulse: 91  Weight: 137 lb 9.6 oz (62.4 kg)  Height: 5\' 7"  (1.702 m)   Body mass index is 21.55 kg/m.  Generalized: Well developed, in no acute distress   Neurological examination  Mentation: Alert  oriented to time,  place, history taking. Follows all commands speech and language fluent Cranial nerve II-XII: Pupils were equal round reactive to light. Extraocular movements were full, visual field were full on confrontational test. Facial sensation and strength were normal. Uvula tongue midline. Head turning and shoulder shrug  were normal and symmetric. Motor: The motor testing reveals 5 over 5 strength of all 4 extremities. Good symmetric motor tone is noted throughout.  Sensory: Sensory testing is intact to soft touch on all 4 extremities. No evidence of extinction is noted.  Coordination: Cerebellar testing reveals good finger-nose-finger and heel-to-shin bilaterally.  Gait and station: in a wheelchair   DIAGNOSTIC DATA (LABS, IMAGING, TESTING) - I reviewed patient records, labs, notes, testing and imaging myself where available.  Lab Results  Component Value Date   WBC 10.8 (H) 07/17/2021   HGB 13.4 07/17/2021   HCT 40.1 07/17/2021   MCV 87.0 07/17/2021   PLT 373 07/17/2021      Component Value Date/Time   NA 138 07/17/2021 1345   K 3.1 (L) 07/17/2021 1345   CL 104 07/17/2021 1345   CO2 25 07/17/2021 1345   GLUCOSE 167 (H) 07/17/2021 1345   BUN 13 07/17/2021 1345   CREATININE 0.76 07/17/2021 1345   CREATININE 0.89 06/10/2021 1210   CALCIUM 9.3 07/17/2021 1345   PROT 7.0 07/17/2021 1345   ALBUMIN 3.7 07/17/2021 1345   AST 18 07/17/2021 1345   ALT 20 07/17/2021 1345   ALKPHOS 123 07/17/2021 1345   BILITOT 0.6 07/17/2021 1345   GFRNONAA >60 07/17/2021 1345   GFRNONAA 88 03/05/2016 0824   GFRAA >89 03/05/2016 0824   Lab Results  Component Value Date   CHOL 266 (H) 06/12/2021   HDL 24 (L) 06/12/2021   LDLCALC 213 (H) 06/12/2021   TRIG 144 06/12/2021   CHOLHDL 11.1 06/12/2021   Lab Results  Component Value Date   HGBA1C 13.6 (H) 06/10/2021   No results found for: VITAMINB12 Lab Results  Component Value Date   TSH 1.02 07/10/2015      ASSESSMENT AND PLAN 71 y.o. year old  male  has a past medical history of Allergy, BPH (benign prostatic hypertrophy), Cancer ([place]), Carotid artery occlusion, Diabetes mellitus, ED (erectile dysfunction), Hyperlipidemia, Hypertension, and Retinopathy due to secondary DM ([place]). here with :  Stroke: hemorrhagic infarct left cerebellum, likely small vs. Large vessel disease given multiple uncontrolled risk factors   Residual deficit: balance . Continue Eliquis (apixaban) daily  for secondary stroke prevention.  Discussed secondary stroke prevention measures and importance of close PCP follow up for aggressive stroke risk factor management. I have gone over the pathophysiology of stroke, warning signs and symptoms, risk factors and their management in some detail with instructions to go to the closest emergency room for symptoms of concern. HTN: BP goal <130/90.  Stable on Liptior per PCP HLD: LDL goal <70. Recent LDL 213.  DMII: A1c goal<7.0. Recent A1c 13.6.  Sent a message to Dr. Dennard Schaumann asking if he will be managing meds for BP and cholesterol. Patient having a hard time getting refills- reports that the oncologist gave a refill but advise they would not be managing. I will also forward my note to Dr. Dennard Schaumann.  Encouraged patient to monitor diet and encouraged exercise FU with our office 4-5 months      Ward Givens, MSN, NP-C 08/04/2021, 8:33 PM [facility] [address] [place],  [address] [phone]

## 2021-08-05 ENCOUNTER — Ambulatory Visit: Payer: PPO

## 2021-08-05 ENCOUNTER — Ambulatory Visit: Payer: PPO | Admitting: Adult Health

## 2021-08-05 ENCOUNTER — Encounter: Payer: Self-pay | Admitting: Adult Health

## 2021-08-05 ENCOUNTER — Ambulatory Visit
Admission: RE | Admit: 2021-08-05 | Discharge: 2021-08-05 | Disposition: A | Discharge status: Home / Self Care | Payer: PPO | Source: Ambulatory Visit | Attending: Oncology | Admitting: Oncology

## 2021-08-05 ENCOUNTER — Encounter: Payer: Self-pay | Admitting: Radiology

## 2021-08-05 VITALS — BP 104/66 | HR 91 | Ht 67.0 in | Wt 137.6 lb

## 2021-08-05 DIAGNOSIS — E119 Type 2 diabetes mellitus without complications: Secondary | ICD-10-CM | POA: Insufficient documentation

## 2021-08-05 DIAGNOSIS — Z794 Long term (current) use of insulin: Secondary | ICD-10-CM | POA: Insufficient documentation

## 2021-08-05 DIAGNOSIS — E1165 Type 2 diabetes mellitus with hyperglycemia: Secondary | ICD-10-CM

## 2021-08-05 DIAGNOSIS — L89152 Pressure ulcer of sacral region, stage 2: Secondary | ICD-10-CM | POA: Diagnosis not present

## 2021-08-05 DIAGNOSIS — C3412 Malignant neoplasm of upper lobe, left bronchus or lung: Secondary | ICD-10-CM | POA: Diagnosis not present

## 2021-08-05 DIAGNOSIS — I619 Nontraumatic intracerebral hemorrhage, unspecified: Secondary | ICD-10-CM

## 2021-08-05 DIAGNOSIS — I10 Essential (primary) hypertension: Secondary | ICD-10-CM | POA: Diagnosis not present

## 2021-08-05 DIAGNOSIS — Z87891 Personal history of nicotine dependence: Secondary | ICD-10-CM | POA: Insufficient documentation

## 2021-08-05 DIAGNOSIS — Z452 Encounter for adjustment and management of vascular access device: Secondary | ICD-10-CM | POA: Diagnosis not present

## 2021-08-05 HISTORY — PX: IR IMAGING GUIDED PORT INSERTION: IMG5740

## 2021-08-05 LAB — GLUCOSE, CAPILLARY: Glucose-Capillary: 106 mg/dL — ABNORMAL HIGH (ref 70–99)

## 2021-08-05 MED ORDER — SODIUM CHLORIDE 0.9 % IV SOLN
INTRAVENOUS | Status: DC
  Filled 2021-08-05: qty 1000

## 2021-08-05 MED ORDER — HEPARIN SOD (PORK) LOCK FLUSH 100 UNIT/ML IV SOLN
INTRAVENOUS | Status: AC
  Administered 2021-08-05: 500 [IU]
  Filled 2021-08-05: qty 5

## 2021-08-05 MED ORDER — MIDAZOLAM HCL 2 MG/2ML IJ SOLN
INTRAMUSCULAR | Status: AC
  Filled 2021-08-05: qty 2

## 2021-08-05 MED ORDER — FENTANYL CITRATE (PF) 100 MCG/2ML IJ SOLN
INTRAMUSCULAR | Status: AC
  Filled 2021-08-05: qty 2

## 2021-08-05 MED ORDER — FENTANYL CITRATE (PF) 100 MCG/2ML IJ SOLN
INTRAMUSCULAR | Status: AC | PRN
  Administered 2021-08-05: 50 ug via INTRAVENOUS

## 2021-08-05 MED ORDER — MIDAZOLAM HCL 2 MG/2ML IJ SOLN
INTRAMUSCULAR | Status: AC | PRN
  Administered 2021-08-05: 1 mg via INTRAVENOUS

## 2021-08-05 MED ORDER — LIDOCAINE-EPINEPHRINE 1 %-1:100000 IJ SOLN
INTRAMUSCULAR | Status: AC
  Administered 2021-08-05: 13 mL
  Filled 2021-08-05: qty 1

## 2021-08-05 MED FILL — Dexamethasone Sodium Phosphate Inj 100 MG/10ML: INTRAMUSCULAR | Qty: 1 | Status: AC

## 2021-08-05 NOTE — Progress Notes (Signed)
I agree with the above plan 

## 2021-08-05 NOTE — H&P (Cosign Needed Addendum)
Chief Complaint: Patient was seen in consultation today for port a catheter placement at the request of Yu,Zhou  Referring Physician(s): Yu,Zhou  Supervising Physician: Juliet Rude  Patient Status: [place] - Out-pt  History of Present Illness: Micheal Donovan is a 71 y.o. 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 Tasia Catchings of oncology and recently seen on 5/23 with request received for image guided port a catheter placement with moderate sedation with IR.  The patient has had a H&P performed within the last 30 days, all history, medications, and exam have been reviewed. The patient denies any interval changes since the H&P.  The patient denies any current chest pain or shortness of breath. The patient denies any recent infections, fever or chills. He has no known complications to sedation.    Past Medical History:  Diagnosis Date   Allergy    BPH (benign prostatic hypertrophy)    Cancer ([place])    Melanoma on Neck    2008   Carotid artery occlusion    Diabetes mellitus    ED (erectile dysfunction)    Hyperlipidemia    Hypertension    Retinopathy due to secondary DM [facility])     Past Surgical History:  Procedure Laterality Date   BRONCHIAL NEEDLE ASPIRATION BIOPSY  06/17/2021   Procedure: BRONCHIAL NEEDLE ASPIRATION BIOPSIES;  Surgeon: Candee Furbish, MD;  Location: [facility] ENDOSCOPY;  Service: Pulmonary;;   MELANOMA EXCISION  2008   Left side of neck   RADIOLOGY WITH ANESTHESIA N/A 04/05/2020   Procedure: MRI SPINE WITOUT CONTRAST;  Surgeon: Radiologist, Medication, MD;  Location: [place];  Service: Radiology;  Laterality: N/A;   TONSILLECTOMY     VIDEO BRONCHOSCOPY WITH ENDOBRONCHIAL ULTRASOUND N/A 06/17/2021   Procedure: VIDEO BRONCHOSCOPY WITH ENDOBRONCHIAL ULTRASOUND;  Surgeon: Candee Furbish, MD;  Location: [facility] ENDOSCOPY;  Service: Pulmonary;  Laterality: N/A;    Allergies: Niaspan [niacin]  Medications: Prior to Admission  medications   Medication Sig Start Date End Date Taking? Authorizing Provider  acetaminophen (TYLENOL) 325 MG tablet Take 2 tablets (650 mg total) by mouth every 4 (four) hours as needed for mild pain (or temp > 37.5 C (99.5 F)). 06/20/21   Sheikh, Omair Latif, DO  amLODipine (NORVASC) 10 MG tablet Take 1 tablet (10 mg total) by mouth daily. Patient not taking: Reported on 08/05/2021 06/26/21   Angiulli, Lavon Paganini, PA-C  apixaban (ELIQUIS) 5 MG TABS tablet Take 1 tablet (5 mg total) by mouth 2 (two) times daily. 07/23/21   [name], MD  atorvastatin (LIPITOR) 40 MG tablet Take 1 tablet (40 mg total) by mouth daily. Patient not taking: Reported on 08/05/2021 06/26/21   Angiulli, Lavon Paganini, PA-C  butalbital-acetaminophen-caffeine (FIORICET) [phone] MG tablet Take 1 tablet by mouth every 6 (six) hours as needed for headache. 06/26/21   Angiulli, Lavon Paganini, PA-C  insulin aspart (NOVOLOG FLEXPEN) 100 UNIT/ML FlexPen Inject 6 Units into the skin 3 (three) times daily with meals. Patient not taking: Reported on 07/23/2021 06/27/21   Angiulli, Lavon Paganini, PA-C  insulin glargine (LANTUS) 100 UNIT/ML Solostar Pen Inject 25 Units into the skin daily. 07/12/21   [name], MD  lidocaine-prilocaine (EMLA) cream Apply to affected area once Patient not taking: Reported on 08/05/2021 07/23/21   [name], MD  loratadine (CLARITIN) 10 MG tablet Take 1 tablet (10 mg total) by mouth daily. 06/26/21   Angiulli, Lavon Paganini, PA-C  LORazepam (ATIVAN) 0.5 MG  tablet 1 tablet p.o. 30 minutes before the PET scan.  May repeat x1 if needed. 07/23/21   Curt Bears, MD  losartan (COZAAR) 50 MG tablet TAKE 1 TABLET(50 MG) BY MOUTH DAILY 06/26/21   Angiulli, Lavon Paganini, PA-C  meclizine (ANTIVERT) 12.5 MG tablet Take 1 tablet (12.5 mg total) by mouth 2 (two) times daily as needed for dizziness. Patient not taking: Reported on 07/23/2021 06/26/21   Angiulli, Lavon Paganini, PA-C  metoprolol succinate (TOPROL-XL) 25 MG 24 hr tablet Take 0.5 tablets  (12.5 mg total) by mouth daily. 06/27/21   Angiulli, Lavon Paganini, PA-C  ondansetron (ZOFRAN) 8 MG tablet Take 1 tablet (8 mg total) by mouth 2 (two) times daily as needed for refractory nausea / vomiting. Start on day 3 after chemo. Patient not taking: Reported on 08/05/2021 07/23/21   [name], MD  oxyCODONE-acetaminophen (PERCOCET) 7.5-325 MG tablet Take 1 tablet by mouth every 4 (four) hours as needed for severe pain. Patient not taking: Reported on 07/23/2021 07/12/21   [name], MD  pantoprazole (PROTONIX) 40 MG tablet Take 1 tablet (40 mg total) by mouth daily at 6 (six) AM. 07/26/21   [name], MD  potassium chloride SA (KLOR-CON M) 20 MEQ tablet Take 1 tablet (20 mEq total) by mouth daily. 07/17/21   Curt Bears, MD  prochlorperazine (COMPAZINE) 10 MG tablet Take 1 tablet (10 mg total) by mouth every 6 (six) hours as needed (Nausea or vomiting). Patient not taking: Reported on 08/05/2021 07/23/21   [name], MD  topiramate (TOPAMAX) 100 MG tablet Take 1 tablet (100 mg total) by mouth at bedtime. 07/24/21   Raulkar, Clide Deutscher, MD  UNABLE TO FIND PLEASE CHECK FASTING BLOOD GLUCOSE EVERY MORNING 07/12/21   [name], MD  Vitamin D, Ergocalciferol, (DRISDOL) 1.25 MG (50000 UNIT) CAPS capsule Take 1 capsule (50,000 Units total) by mouth every 7 (seven) days. 07/30/21   Raulkar, Clide Deutscher, MD     Family History  Problem Relation Age of Onset   COPD Mother    Heart disease Father    Heart disease Brother        MI at age 44   Stroke Neg Hx     Social History   Socioeconomic History   Marital status: Married    Spouse name: Not on file   Number of children: Not on file   Years of education: Not on file   Highest education level: Not on file  Occupational History   Not on file  Tobacco Use   Smoking status: Former    Types: Cigarettes    Quit date: 07/21/1990    Years since quitting: 31.0   Smokeless tobacco: Never  Vaping Use   Vaping Use: Never used  Substance and Sexual  Activity   Alcohol use: No   Drug use: No   Sexual activity: Not on file  Other Topics Concern   Not on file  Social History Narrative   Not on file   Social Determinants of Health   Financial Resource Strain: Low Risk    Difficulty of Paying Living Expenses: Not very hard  Food Insecurity: No Food Insecurity   Worried About Running Out of Food in the Last Year: Never true   Ran Out of Food in the Last Year: Never true  Transportation Needs: No Transportation Needs   Lack of Transportation (Medical): No   Lack of Transportation (Non-Medical): No  Physical Activity: Inactive   Days of Exercise per  Week: 0 days   Minutes of Exercise per Session: 0 min  Stress: Unknown   Feeling of Stress : Patient refused  Social Connections: Unknown   Frequency of Communication with Friends and Family: Three times a week   Frequency of Social Gatherings with Friends and Family: Three times a week   Attends Religious Services: Patient refused   Active Member of Clubs or Organizations: Patient refused   Attends Archivist Meetings: Patient refused   Marital Status: Married    Review of Systems: A 12 point ROS discussed and pertinent positives are indicated in the HPI above.  All other systems are negative.  Review of Systems  Vital Signs: BP 106/64   Temp 98.4 F (36.9 C)   Resp 20   Ht 5\' 7"  (1.702 m)   Wt 137 lb 9.1 oz (62.4 kg)   SpO2 100%   BMI 21.55 kg/m   Physical Exam Constitutional:      Appearance: Normal appearance.  HENT:     Head: Normocephalic and atraumatic.  Cardiovascular:     Rate and Rhythm: Normal rate and regular rhythm.  Pulmonary:     Effort: Pulmonary effort is normal. No respiratory distress.  Neurological:     Mental Status: He is alert and oriented to person, place, and time.    Imaging: NM PET Image Initial (PI) Skull Base To Thigh  Result Date: 07/26/2021 CLINICAL DATA:  Initial treatment strategy for left upper lobe lung cancer. EXAM:  NUCLEAR MEDICINE PET SKULL BASE TO THIGH TECHNIQUE: 7.4 mCi F-18 FDG was injected intravenously. Full-ring PET imaging was performed from the skull base to thigh after the radiotracer. CT data was obtained and used for attenuation correction and anatomic localization. Fasting blood glucose: 155 mg/dl COMPARISON:  Multiple exams, including CT exams from 06/13/2021 and 06/18/2021 FINDINGS: Mediastinal blood pool activity: SUV max 1.8 Liver activity: SUV max NA NECK: Mixed density photopenic lesion in the left cerebellum characterized as hemorrhage on recent prior imaging workups. Incidental CT findings: Chronic bilateral maxillary sinusitis. Bilateral common carotid atherosclerotic calcification. CHEST: The dominant 3.8 cm left upper lobe mass has a maximum SUV of 19.6, compatible with malignancy. 1.0 cm left hilar node on image 83 series 2, maximum SUV 6.7, compatible with malignant involvement. The left infrahilar node has low-grade activity with maximum SUV of 3.4, likely malignant. There is surrounding ground-glass opacity and airspace opacity, likely from surrounding pneumonitis although entity such as pulmonary hemorrhage or lymphangitic carcinomatosis cannot be readily excluded. The nodular bulging along the fissure potentially extending into the lower lobe on image 86 of series 2 is probably hypermetabolic although difficult to separate from the main hypermetabolic mass. Incidental CT findings: Coronary, aortic arch, and branch vessel atherosclerotic vascular disease. ABDOMEN/PELVIS: No significant abnormal hypermetabolic activity in this region. Incidental CT findings: Atherosclerosis is present, including aortoiliac atherosclerotic disease. SKELETON: Hypermetabolic skeletal metastatic lesions are observed. These include the left seventh rib laterally with associated pathologic fracture (maximum SUV 10.3), the left T12 pedicle, the left L1 vertebral body, anterior L2 vertebral body, the posterior L5 vertebral  body (maximum SUV 16.5), the right posterior iliac bone, the right anterior iliac bone, and the left posterior iliac bone adjacent to the SI joint. The right anterior iliac bone lesion has a maximum SUV of 15.9 and demonstrates early cortical breakthrough laterally on image 197 series 2. These lesions are subtle and where visible, generally lucent. Incidental CT findings: Bilateral degenerative glenohumeral arthropathy. Degenerative disc disease at L5-S1. IMPRESSION: 1.  3.8 cm hypermetabolic left upper lobe mass with hypermetabolic left hilar and infrahilar adenopathy, as well as approximately 8 scattered metastatic lesions in the skeleton. 2. Mixed density photopenic lesion in the left cerebellum, characterized as hemorrhage on recent prior imaging workups. 3. Aortic Atherosclerosis (ICD10-I70.0). Coronary atherosclerosis. Degenerative disc disease at L5-S1. Chronic bilateral maxillary sinusitis. Electronically Signed   By: Van Clines M.D.   On: 07/26/2021 11:14    Labs:  CBC: Recent Labs    06/25/21 0509 06/26/21 0536 06/27/21 0609 07/17/21 1345  WBC 10.0 10.2 11.1* 10.8*  HGB 13.1 13.5 14.8 13.4  HCT 39.0 40.6 44.0 40.1  PLT 256 299 327 373    COAGS: Recent Labs    06/11/21 0118  INR 1.0  APTT 35    BMP: Recent Labs    06/19/21 0330 06/20/21 0319 06/21/21 0505 07/17/21 1345  NA 130* 127* 133* 138  K 3.8 4.1 3.8 3.1*  CL 98 95* 102 104  CO2 25 22 23 25   GLUCOSE 161* 206* 96 167*  BUN 17 23 14 13   CALCIUM 9.3 8.8* 9.2 9.3  CREATININE 0.71 0.71 0.61 0.76  GFRNONAA >60 >60 >60 >60    LIVER FUNCTION TESTS: Recent Labs    06/12/21 0925 06/20/21 0319 06/21/21 0505 07/17/21 1345  BILITOT 0.8 0.7 0.6 0.6  AST 18 27 20 18   ALT 14 27 27 20   ALKPHOS 128* 120 122 123  PROT 6.6 6.2* 6.7 7.0  ALBUMIN 3.4* 3.3* 3.2* 3.7    Assessment and Plan: This is a 71 year old male with PMHx significant for 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 Tasia Catchings of  oncology and recently seen on 5/23 with request received for image guided port a catheter placement with moderate sedation with IR.  The patient has been NPO, no medical changes since recent H&P, labs and vitals have been reviewed.  Risks and benefits of image guided port-a-catheter placement was discussed with the patient including, but not limited to bleeding, infection, pneumothorax, or fibrin sheath development and need for additional procedures.  All of the patient's questions were answered, patient is agreeable to proceed. Consent signed and in chart.   Thank you for this interesting consult.  I greatly enjoyed meeting Micheal Donovan and look forward to participating in their care.  A copy of this report was sent to the requesting provider on this date.  Electronically Signed: Hedy Jacob, PA-C 08/05/2021, 2:27 PM   I spent a total of 15 Minutes in face to face in clinical consultation, greater than 50% of which was counseling/coordinating care for port a catheter placement.

## 2021-08-06 ENCOUNTER — Encounter: Payer: Self-pay | Admitting: *Deleted

## 2021-08-06 ENCOUNTER — Ambulatory Visit: Payer: PPO

## 2021-08-06 ENCOUNTER — Inpatient Hospital Stay (HOSPITAL_BASED_OUTPATIENT_CLINIC_OR_DEPARTMENT_OTHER): Payer: PPO | Admitting: Hospice and Palliative Medicine

## 2021-08-06 ENCOUNTER — Inpatient Hospital Stay: Payer: PPO | Attending: Radiation Oncology

## 2021-08-06 ENCOUNTER — Inpatient Hospital Stay: Payer: PPO | Admitting: Oncology

## 2021-08-06 ENCOUNTER — Encounter: Payer: Self-pay | Admitting: Oncology

## 2021-08-06 ENCOUNTER — Ambulatory Visit
Admission: RE | Admit: 2021-08-06 | Discharge: 2021-08-06 | Disposition: A | Discharge status: Home / Self Care | Payer: PPO | Source: Ambulatory Visit | Attending: Radiation Oncology | Admitting: Radiation Oncology

## 2021-08-06 ENCOUNTER — Inpatient Hospital Stay: Payer: PPO

## 2021-08-06 VITALS — BP 98/66 | HR 70

## 2021-08-06 VITALS — BP 107/54 | HR 74 | Temp 96.9°F | Resp 18 | Wt 137.4 lb

## 2021-08-06 DIAGNOSIS — Z85820 Personal history of malignant melanoma of skin: Secondary | ICD-10-CM | POA: Diagnosis not present

## 2021-08-06 DIAGNOSIS — R42 Dizziness and giddiness: Secondary | ICD-10-CM | POA: Diagnosis not present

## 2021-08-06 DIAGNOSIS — Z7189 Other specified counseling: Secondary | ICD-10-CM | POA: Diagnosis not present

## 2021-08-06 DIAGNOSIS — C3412 Malignant neoplasm of upper lobe, left bronchus or lung: Secondary | ICD-10-CM | POA: Insufficient documentation

## 2021-08-06 DIAGNOSIS — I10 Essential (primary) hypertension: Secondary | ICD-10-CM | POA: Insufficient documentation

## 2021-08-06 DIAGNOSIS — I2510 Atherosclerotic heart disease of native coronary artery without angina pectoris: Secondary | ICD-10-CM | POA: Insufficient documentation

## 2021-08-06 DIAGNOSIS — E11319 Type 2 diabetes mellitus with unspecified diabetic retinopathy without macular edema: Secondary | ICD-10-CM | POA: Diagnosis not present

## 2021-08-06 DIAGNOSIS — C7951 Secondary malignant neoplasm of bone: Secondary | ICD-10-CM | POA: Insufficient documentation

## 2021-08-06 DIAGNOSIS — G893 Neoplasm related pain (acute) (chronic): Secondary | ICD-10-CM | POA: Diagnosis not present

## 2021-08-06 DIAGNOSIS — Z5111 Encounter for antineoplastic chemotherapy: Secondary | ICD-10-CM

## 2021-08-06 DIAGNOSIS — M5137 Other intervertebral disc degeneration, lumbosacral region: Secondary | ICD-10-CM | POA: Diagnosis not present

## 2021-08-06 DIAGNOSIS — Z87891 Personal history of nicotine dependence: Secondary | ICD-10-CM | POA: Diagnosis not present

## 2021-08-06 DIAGNOSIS — D6481 Anemia due to antineoplastic chemotherapy: Secondary | ICD-10-CM | POA: Insufficient documentation

## 2021-08-06 DIAGNOSIS — E871 Hypo-osmolality and hyponatremia: Secondary | ICD-10-CM | POA: Diagnosis not present

## 2021-08-06 DIAGNOSIS — Z794 Long term (current) use of insulin: Secondary | ICD-10-CM | POA: Insufficient documentation

## 2021-08-06 DIAGNOSIS — Z79899 Other long term (current) drug therapy: Secondary | ICD-10-CM | POA: Diagnosis not present

## 2021-08-06 DIAGNOSIS — J320 Chronic maxillary sinusitis: Secondary | ICD-10-CM | POA: Insufficient documentation

## 2021-08-06 DIAGNOSIS — E118 Type 2 diabetes mellitus with unspecified complications: Secondary | ICD-10-CM | POA: Insufficient documentation

## 2021-08-06 DIAGNOSIS — I700 Atherosclerosis of aorta: Secondary | ICD-10-CM | POA: Diagnosis not present

## 2021-08-06 DIAGNOSIS — I2699 Other pulmonary embolism without acute cor pulmonale: Secondary | ICD-10-CM | POA: Insufficient documentation

## 2021-08-06 DIAGNOSIS — Z7901 Long term (current) use of anticoagulants: Secondary | ICD-10-CM | POA: Insufficient documentation

## 2021-08-06 DIAGNOSIS — Z8673 Personal history of transient ischemic attack (TIA), and cerebral infarction without residual deficits: Secondary | ICD-10-CM | POA: Diagnosis not present

## 2021-08-06 DIAGNOSIS — E785 Hyperlipidemia, unspecified: Secondary | ICD-10-CM | POA: Diagnosis not present

## 2021-08-06 DIAGNOSIS — Z515 Encounter for palliative care: Secondary | ICD-10-CM | POA: Diagnosis not present

## 2021-08-06 DIAGNOSIS — N400 Enlarged prostate without lower urinary tract symptoms: Secondary | ICD-10-CM | POA: Insufficient documentation

## 2021-08-06 DIAGNOSIS — K219 Gastro-esophageal reflux disease without esophagitis: Secondary | ICD-10-CM | POA: Insufficient documentation

## 2021-08-06 LAB — CBC WITH DIFFERENTIAL/PLATELET
Abs Immature Granulocytes: 0.05 10*3/uL (ref 0.00–0.07)
Basophils Absolute: 0.1 10*3/uL (ref 0.0–0.1)
Basophils Relative: 1 %
Eosinophils Absolute: 0.5 10*3/uL (ref 0.0–0.5)
Eosinophils Relative: 5 %
HCT: 34.9 % — ABNORMAL LOW (ref 39.0–52.0)
Hemoglobin: 11.3 g/dL — ABNORMAL LOW (ref 13.0–17.0)
Immature Granulocytes: 1 %
Lymphocytes Relative: 20 %
Lymphs Abs: 2.1 10*3/uL (ref 0.7–4.0)
MCH: 29.1 pg (ref 26.0–34.0)
MCHC: 32.4 g/dL (ref 30.0–36.0)
MCV: 89.9 fL (ref 80.0–100.0)
Monocytes Absolute: 0.8 10*3/uL (ref 0.1–1.0)
Monocytes Relative: 8 %
Neutro Abs: 6.9 10*3/uL (ref 1.7–7.7)
Neutrophils Relative %: 65 %
Platelets: 447 10*3/uL — ABNORMAL HIGH (ref 150–400)
RBC: 3.88 MIL/uL — ABNORMAL LOW (ref 4.22–5.81)
RDW: 14.9 % (ref 11.5–15.5)
WBC: 10.4 10*3/uL (ref 4.0–10.5)
nRBC: 0 % (ref 0.0–0.2)

## 2021-08-06 LAB — COMPREHENSIVE METABOLIC PANEL
ALT: 11 U/L (ref 0–44)
AST: 13 U/L — ABNORMAL LOW (ref 15–41)
Albumin: 3.5 g/dL (ref 3.5–5.0)
Alkaline Phosphatase: 104 U/L (ref 38–126)
Anion gap: 8 (ref 5–15)
BUN: 12 mg/dL (ref 8–23)
CO2: 24 mmol/L (ref 22–32)
Calcium: 8.9 mg/dL (ref 8.9–10.3)
Chloride: 104 mmol/L (ref 98–111)
Creatinine, Ser: 0.53 mg/dL — ABNORMAL LOW (ref 0.61–1.24)
GFR, Estimated: >60 mL/min (ref >60)
Glucose, Bld: 108 mg/dL — ABNORMAL HIGH (ref 70–99)
Potassium: 3.9 mmol/L (ref 3.5–5.1)
Sodium: 136 mmol/L (ref 135–145)
Total Bilirubin: 0.3 mg/dL (ref 0.3–1.2)
Total Protein: 7.1 g/dL (ref 6.5–8.1)

## 2021-08-06 MED ORDER — OXYCODONE HCL 5 MG PO TABS
5.0000 mg | ORAL_TABLET | Freq: Once | ORAL | Status: AC
  Administered 2021-08-06: 5 mg via ORAL
  Filled 2021-08-06: qty 1

## 2021-08-06 MED ORDER — DEXAMETHASONE 4 MG PO TABS: ORAL_TABLET | ORAL | 1 refills | Status: DC

## 2021-08-06 MED ORDER — SODIUM CHLORIDE 0.9 % IV SOLN
45.0000 mg/m2 | Freq: Once | INTRAVENOUS | Status: AC
  Administered 2021-08-06: 78 mg via INTRAVENOUS
  Filled 2021-08-06: qty 13

## 2021-08-06 MED ORDER — SODIUM CHLORIDE 0.9 % IV SOLN
10.0000 mg | Freq: Once | INTRAVENOUS | Status: AC
  Administered 2021-08-06: 10 mg via INTRAVENOUS
  Filled 2021-08-06: qty 10

## 2021-08-06 MED ORDER — FAMOTIDINE IN NACL 20-0.9 MG/50ML-% IV SOLN
20.0000 mg | Freq: Once | INTRAVENOUS | Status: AC
  Administered 2021-08-06: 20 mg via INTRAVENOUS
  Filled 2021-08-06: qty 50

## 2021-08-06 MED ORDER — PALONOSETRON HCL INJECTION 0.25 MG/5ML
0.2500 mg | Freq: Once | INTRAVENOUS | Status: AC
  Administered 2021-08-06: 0.25 mg via INTRAVENOUS
  Filled 2021-08-06: qty 5

## 2021-08-06 MED ORDER — FOLIC ACID 1 MG PO TABS: 1.0000 mg | ORAL_TABLET | Freq: Every day | ORAL | 3 refills | Status: DC

## 2021-08-06 MED ORDER — DIPHENHYDRAMINE HCL 50 MG/ML IJ SOLN
50.0000 mg | Freq: Once | INTRAMUSCULAR | Status: AC
  Administered 2021-08-06: 50 mg via INTRAVENOUS
  Filled 2021-08-06: qty 1

## 2021-08-06 MED ORDER — HEPARIN SOD (PORK) LOCK FLUSH 100 UNIT/ML IV SOLN
500.0000 [IU] | Freq: Once | INTRAVENOUS | Status: AC | PRN
  Administered 2021-08-06: 500 [IU]
  Filled 2021-08-06: qty 5

## 2021-08-06 MED ORDER — SODIUM CHLORIDE 0.9 % IV SOLN
Freq: Once | INTRAVENOUS | Status: AC
  Filled 2021-08-06: qty 250

## 2021-08-06 MED ORDER — SODIUM CHLORIDE 0.9 % IV SOLN
169.0000 mg | Freq: Once | INTRAVENOUS | Status: AC
  Administered 2021-08-06: 170 mg via INTRAVENOUS
  Filled 2021-08-06: qty 17

## 2021-08-06 MED FILL — Dexamethasone Sodium Phosphate Inj 100 MG/10ML: INTRAMUSCULAR | Qty: 1 | Status: AC

## 2021-08-06 NOTE — Progress Notes (Signed)
Patient here for follow up and new tx start .

## 2021-08-06 NOTE — Progress Notes (Signed)
Nutrition Assessment   Reason for Assessment:   Referral for weight loss and poor appetite   ASSESSMENT:  71 year old male with non small cell lung cancer, recently found after hospitalization with stroke (4/20-4/27).  Past medical history of DM, HTN, HLD, stroke.  Patient starting radiation and chemotherapy.  Met with patient during infusion.  Patient reports that appetite was decreased around the time of hospitalization but it has since improved.  Typically eats egg for breakfast, sometimes bacon or fried chicken and diet pepsi.  Most often skips lunch.  Supper is usually meat and couple of sides.  Typically goes out to eat but wife is currently in the hospital.  Daughter is staying with him.    Noted pressure ulcer on sacrum per documentation.     Medications: lantus, ativan, zofran, protonix, KCL, compazine, Vit D   Labs: glucose 108   Anthropometrics:   Height: 67 inches Weight: 137 lb 9.1 oz   134 lb on 6/1 143 lb on 4/20 175 lb on 04/05/20 BMI: 21  Recent weight gain 4% weight loss in the last 1 1/2 months   NUTRITION DIAGNOSIS: Inadequate oral intake related to stoke and cancer, recent hospitalization, wife's hospitalization as evidenced by 4% weight loss    INTERVENTION:  Discussed importance of good nutrition during treatment and weight maintenance Encouraged good sources of protein Samples of glucerna and ensure max protein given to patient to try.  If able to drink would recommend 1-2 per day.  Coupons and listing of other shakes low in sugar provided to patient today.  Called daughter after receiving patient's permission but was unable to reach her.  Contact information left for daughter to call RD.    MONITORING, EVALUATION, GOAL: weight trends, intake   Next Visit: Tuesday, June 27 after radiation  Donnelle Olmeda B. Zenia Resides, [place], [place] Registered Dietitian [phone]

## 2021-08-06 NOTE — Progress Notes (Signed)
Hematology/Oncology Consult note [phone] [phone]         Patient Care Team: Pickard, Warren T, MD as PCP - General (Family Medicine) Rhode, Hayley, RN as Oncology Nurse Navigator  REFERRING PROVIDER: Pickard, Warren T, MD  CHIEF COMPLAINTS/REASON FOR VISIT:  non-small cell lung cancer  HISTORY OF PRESENTING ILLNESS:   Micheal Donovan is a  71 y.o.  male with PMH listed below was seen in consultation at the request of  Pickard, Warren T, MD  for evaluation of non-small cell lung cancer.  06/20/2021 - 06/27/2021, patient presented to [facility] due to progressive headache/dizziness/gait changes. CT head showed acute to subacute intraparenchymal hemorrhage involving the left cerebellum.  Surrounding low-density vasogenic edema.  Patient was transferred to [facility]. This was further evaluated by CT angiogram of the neck which showed bulky calcified plaques/stenosis of carotid artery, Followed by MRI brain. 06/12/2021, MRI of the brain showed no significant interval change in size of the left cerebellar intraparenchymal hematoma with unchanged regional mass effect and partial effacement of fourth ventricle but no upstream hydrocephalus. There is no discernible enhancement to suggest underlying mass lesion, though acute blood products could mask enhancement.   06/12/2021 a chest x-ray showed a 4.4 cm left middle lobe. 06/13/2021, CT chest with contrast showed a 4.3 x 3.6 x 3.3 cm lobular spiculated mass in the posterior left upper lobe with T3 to the lateral pleura and major fissure.  Metastatic left hilar lymphadenopathy.  Peripheral micronodularity posterior right costophrenic sulcus.  Aortic atherosclerosis. 06/14/2021, CT abdomen pelvis showed right lower lobe pulmonary artery embolus.  No evidence of right heart strain.  No acute intra-abdominal or pelvic pathology.  Aortic atherosclerosis.  06/13/2021, patient underwent bronchoscopy with EBUS by Dr.  Smith.  Biopsy from the fine-needle aspiration of station 11 mL showed malignant cells, consistent with poorly differentiated non-small cell carcinoma, consistent with adenocarcinoma.  Malignant cells are TTF-1 positive and negative for p40.  Negative for neuroendocrine markers.  Patient was evaluated by radiation oncology and hematology oncology at [place] [facility] [place]. Blood test and tissue sample were tested for Gardant 360  -PD-L1 TPS 97%, no actionable mutation on the blood testing. 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  Patient is currently on anticoagulation with Eliquis for pulmonary embolism, carotid stenosis.  Patient has a follow-up appointment with neurology as well as repeat MRI brain in June.  Patient reports chronic headache, 5 out of 10, same level comparing to when he was discharged from the hospital.  Not worse.  He has previously tried tramadol which did not help.  He has oxycodone as needed prescription at home which sometimes provides relief.   His mobility is decreased secondary to dizzy spell with position changes and exertion.  + Shortness of breath with exertion occasional hemoptysis.  Appetite is fair. Patient lives at home with her wife.  Wife is currently hospitalized.  Patient's daughter visits patient and provides assistance.  Patient was referred to establish care with our cancer center as patient would like to have treatments closer to home.   He is a former smoker, quit 20 years ago. Patient has a history of melanoma on his neck, treated in 2009.  INTERVAL HISTORY Micheal Donovan is a 71 y.o. male who has above history reviewed by me today presents for follow up visit for lung cancer chemotherapy treatments. Patient starts radiation today. He has been to chemotherapy class and reports understanding and high and medics instructions.  Dizziness is better. Chronic   headache, unchanged.  Patient forgets to take his oxycodone and asks if he could receive 1 dose prior  to the chemotherapy.  He understands to bring his own supply with his future treatments.   Review of Systems  Constitutional:  Positive for fatigue. Negative for appetite change, chills, fever and unexpected weight change.  HENT:   Negative for hearing loss and voice change.   Eyes:  Negative for eye problems and icterus.  Respiratory:  Positive for cough and shortness of breath. Negative for chest tightness.   Cardiovascular:  Negative for chest pain and leg swelling.  Gastrointestinal:  Negative for abdominal distention, abdominal pain and blood in stool.  Endocrine: Negative for hot flashes.  Genitourinary:  Negative for difficulty urinating, dysuria and frequency.   Musculoskeletal:  Negative for arthralgias.  Skin:  Negative for itching and rash.  Neurological:  Positive for headaches. Negative for extremity weakness, light-headedness and numbness.  Hematological:  Negative for adenopathy. Does not bruise/bleed easily.  Psychiatric/Behavioral:  Negative for confusion.    MEDICAL HISTORY:  Past Medical History:  Diagnosis Date   Allergy    BPH (benign prostatic hypertrophy)    Cancer ([place])    Melanoma on Neck    2008   Carotid artery occlusion    Diabetes mellitus    ED (erectile dysfunction)    Hyperlipidemia    Hypertension    Retinopathy due to secondary DM [facility])     SURGICAL HISTORY: Past Surgical History:  Procedure Laterality Date   BRONCHIAL NEEDLE ASPIRATION BIOPSY  06/17/2021   Procedure: BRONCHIAL NEEDLE ASPIRATION BIOPSIES;  Surgeon: Candee Furbish, MD;  Location: [facility];  Service: Pulmonary;;   IR IMAGING GUIDED PORT INSERTION  08/05/2021   MELANOMA EXCISION  2008   Left side of neck   RADIOLOGY WITH ANESTHESIA N/A 04/05/2020   Procedure: MRI SPINE WITOUT CONTRAST;  Surgeon: Radiologist, Medication, MD;  Location: [facility];  Service: Radiology;  Laterality: N/A;   TONSILLECTOMY     VIDEO BRONCHOSCOPY WITH ENDOBRONCHIAL ULTRASOUND N/A 06/17/2021   Procedure:  VIDEO BRONCHOSCOPY WITH ENDOBRONCHIAL ULTRASOUND;  Surgeon: Candee Furbish, MD;  Location[facility] ENDOSCOPY;  Service: Pulmonary;  Laterality: N/A;    SOCIAL HISTORY: Social History   Socioeconomic History   Marital status: Married    Spouse name: Not on file   Number of children: Not on file   Years of education: Not on file   Highest education level: Not on file  Occupational History   Not on file  Tobacco Use   Smoking status: Former    Types: Cigarettes    Quit date: 07/21/1990    Years since quitting: 31.0   Smokeless tobacco: Never  Vaping Use   Vaping Use: Never used  Substance and Sexual Activity   Alcohol use: No   Drug use: No   Sexual activity: Not on file  Other Topics Concern   Not on file  Social History Narrative   Not on file   Social Determinants of Health   Financial Resource Strain: Low Risk    Difficulty of Paying Living Expenses: Not very hard  Food Insecurity: No Food Insecurity   Worried About Running Out of Food in the Last Year: Never true   [place] in the Last Year: Never true  Transportation Needs: No Transportation Needs   Lack of Transportation (Medical): No   Lack of Transportation (Non-Medical): No  Physical Activity: Inactive   Days of Exercise per Week: 0 days  Minutes of Exercise per Session: 0 min  Stress: Unknown   Feeling of Stress : Patient refused  Social Connections: Unknown   Frequency of Communication with Friends and Family: Three times a week   Frequency of Social Gatherings with Friends and Family: Three times a week   Attends Religious Services: Patient refused   Active Member of Clubs or Organizations: Patient refused   Attends Club or Organization Meetings: Patient refused   Marital Status: Married  Intimate Partner Violence: Not At Risk   Fear of Current or Ex-Partner: No   Emotionally Abused: No   Physically Abused: No   Sexually Abused: No    FAMILY HISTORY: Family History  Problem Relation Age of  Onset   COPD Mother    Heart disease Father    Heart disease Brother        MI at age 63   Stroke Neg Hx     ALLERGIES:  is allergic to niaspan [niacin].  MEDICATIONS:  Current Outpatient Medications  Medication Sig Dispense Refill   acetaminophen (TYLENOL) 325 MG tablet Take 2 tablets (650 mg total) by mouth every 4 (four) hours as needed for mild pain (or temp > 37.5 C (99.5 F)).     amLODipine (NORVASC) 10 MG tablet Take 1 tablet (10 mg total) by mouth daily. 30 tablet 0   apixaban (ELIQUIS) 5 MG TABS tablet Take 1 tablet (5 mg total) by mouth 2 (two) times daily. 60 tablet 1   atorvastatin (LIPITOR) 40 MG tablet Take 1 tablet (40 mg total) by mouth daily. 30 tablet 0   butalbital-acetaminophen-caffeine (FIORICET) 50-325-40 MG tablet Take 1 tablet by mouth every 6 (six) hours as needed for headache. 10 tablet 0   insulin aspart (NOVOLOG FLEXPEN) 100 UNIT/ML FlexPen Inject 6 Units into the skin 3 (three) times daily with meals. 15 mL 11   insulin glargine (LANTUS) 100 UNIT/ML Solostar Pen Inject 25 Units into the skin daily. 15 mL 11   lidocaine-prilocaine (EMLA) cream Apply to affected area once 30 g 3   loratadine (CLARITIN) 10 MG tablet Take 1 tablet (10 mg total) by mouth daily. 30 tablet 0   losartan (COZAAR) 50 MG tablet TAKE 1 TABLET(50 MG) BY MOUTH DAILY 30 tablet 0   meclizine (ANTIVERT) 12.5 MG tablet Take 1 tablet (12.5 mg total) by mouth 2 (two) times daily as needed for dizziness. 30 tablet 0   metoprolol succinate (TOPROL-XL) 25 MG 24 hr tablet Take 0.5 tablets (12.5 mg total) by mouth daily. 30 tablet 0   ondansetron (ZOFRAN) 8 MG tablet Take 1 tablet (8 mg total) by mouth 2 (two) times daily as needed for refractory nausea / vomiting. Start on day 3 after chemo. 30 tablet 1   oxyCODONE-acetaminophen (PERCOCET) 7.5-325 MG tablet Take 1 tablet by mouth every 4 (four) hours as needed for severe pain. 60 tablet 0   pantoprazole (PROTONIX) 40 MG tablet Take 1 tablet (40 mg  total) by mouth daily at 6 (six) AM. 90 tablet 0   potassium chloride SA (KLOR-CON M) 20 MEQ tablet Take 1 tablet (20 mEq total) by mouth daily. 7 tablet 0   prochlorperazine (COMPAZINE) 10 MG tablet Take 1 tablet (10 mg total) by mouth every 6 (six) hours as needed (Nausea or vomiting). 30 tablet 1   topiramate (TOPAMAX) 100 MG tablet Take 1 tablet (100 mg total) by mouth at bedtime. 30 tablet 0   UNABLE TO FIND PLEASE CHECK FASTING BLOOD GLUCOSE EVERY MORNING 1   each 0   Vitamin D, Ergocalciferol, (DRISDOL) 1.25 MG (50000 UNIT) CAPS capsule Take 1 capsule (50,000 Units total) by mouth every 7 (seven) days. 5 capsule 0   LORazepam (ATIVAN) 0.5 MG tablet 1 tablet p.o. 30 minutes before the PET scan.  May repeat x1 if needed. (Patient not taking: Reported on 08/06/2021) 2 tablet 0   No current facility-administered medications for this visit.     PHYSICAL EXAMINATION:  Vitals:   08/06/21 0848  BP: (!) 107/54  Pulse: 74  Resp: 18  Temp: (!) 96.9 F (36.1 C)  SpO2: 100%   Filed Weights   08/06/21 0848  Weight: 137 lb 6.4 oz (62.3 kg)    Physical Exam Constitutional:      General: He is not in acute distress. HENT:     Head: Normocephalic and atraumatic.  Eyes:     General: No scleral icterus. Cardiovascular:     Rate and Rhythm: Normal rate and regular rhythm.     Heart sounds: Normal heart sounds.  Pulmonary:     Effort: Pulmonary effort is normal. No respiratory distress.     Breath sounds: No wheezing.     Comments: Decreased breath sound bilaterally. Abdominal:     General: Bowel sounds are normal. There is no distension.     Palpations: Abdomen is soft.  Musculoskeletal:        General: No deformity. Normal range of motion.     Cervical back: Normal range of motion and neck supple.  Skin:    General: Skin is warm and dry.     Findings: No erythema or rash.  Neurological:     Mental Status: He is alert and oriented to person, place, and time. Mental status is at  baseline.     Cranial Nerves: No cranial nerve deficit.     Coordination: Coordination normal.  Psychiatric:        Mood and Affect: Mood normal.    LABORATORY DATA:  I have reviewed the data as listed Lab Results  Component Value Date   WBC 10.4 08/06/2021   HGB 11.3 (L) 08/06/2021   HCT 34.9 (L) 08/06/2021   MCV 89.9 08/06/2021   PLT 447 (H) 08/06/2021   Recent Labs    06/21/21 0505 07/17/21 1345 08/06/21 0829  NA 133* 138 136  K 3.8 3.1* 3.9  CL 102 104 104  CO2 23 25 24  GLUCOSE 96 167* 108*  BUN 14 13 12  CREATININE 0.61 0.76 0.53*  CALCIUM 9.2 9.3 8.9  GFRNONAA >60 >60 >60  PROT 6.7 7.0 7.1  ALBUMIN 3.2* 3.7 3.5  AST 20 18 13*  ALT 27 20 11  ALKPHOS 122 123 104  BILITOT 0.6 0.6 0.3    Iron/TIBC/Ferritin/ %Sat No results found for: IRON, TIBC, FERRITIN, IRONPCTSAT    RADIOGRAPHIC STUDIES: I have personally reviewed the radiological images as listed and agreed with the findings in the report. NM PET Image Initial (PI) Skull Base To Thigh  Result Date: 07/26/2021 CLINICAL DATA:  Initial treatment strategy for left upper lobe lung cancer. EXAM: NUCLEAR MEDICINE PET SKULL BASE TO THIGH TECHNIQUE: 7.4 mCi F-18 FDG was injected intravenously. Full-ring PET imaging was performed from the skull base to thigh after the radiotracer. CT data was obtained and used for attenuation correction and anatomic localization. Fasting blood glucose: 155 mg/dl COMPARISON:  Multiple exams, including CT exams from 06/13/2021 and 06/18/2021 FINDINGS: Mediastinal blood pool activity: SUV max 1.8 Liver activity: SUV max NA NECK:   Mixed density photopenic lesion in the left cerebellum characterized as hemorrhage on recent prior imaging workups. Incidental CT findings: Chronic bilateral maxillary sinusitis. Bilateral common carotid atherosclerotic calcification. CHEST: The dominant 3.8 cm left upper lobe mass has a maximum SUV of 19.6, compatible with malignancy. 1.0 cm left hilar node on image  83 series 2, maximum SUV 6.7, compatible with malignant involvement. The left infrahilar node has low-grade activity with maximum SUV of 3.4, likely malignant. There is surrounding ground-glass opacity and airspace opacity, likely from surrounding pneumonitis although entity such as pulmonary hemorrhage or lymphangitic carcinomatosis cannot be readily excluded. The nodular bulging along the fissure potentially extending into the lower lobe on image 86 of series 2 is probably hypermetabolic although difficult to separate from the main hypermetabolic mass. Incidental CT findings: Coronary, aortic arch, and branch vessel atherosclerotic vascular disease. ABDOMEN/PELVIS: No significant abnormal hypermetabolic activity in this region. Incidental CT findings: Atherosclerosis is present, including aortoiliac atherosclerotic disease. SKELETON: Hypermetabolic skeletal metastatic lesions are observed. These include the left seventh rib laterally with associated pathologic fracture (maximum SUV 10.3), the left T12 pedicle, the left L1 vertebral body, anterior L2 vertebral body, the posterior L5 vertebral body (maximum SUV 16.5), the right posterior iliac bone, the right anterior iliac bone, and the left posterior iliac bone adjacent to the SI joint. The right anterior iliac bone lesion has a maximum SUV of 15.9 and demonstrates early cortical breakthrough laterally on image 197 series 2. These lesions are subtle and where visible, generally lucent. Incidental CT findings: Bilateral degenerative glenohumeral arthropathy. Degenerative disc disease at L5-S1. IMPRESSION: 1. 3.8 cm hypermetabolic left upper lobe mass with hypermetabolic left hilar and infrahilar adenopathy, as well as approximately 8 scattered metastatic lesions in the skeleton. 2. Mixed density photopenic lesion in the left cerebellum, characterized as hemorrhage on recent prior imaging workups. 3. Aortic Atherosclerosis (ICD10-I70.0). Coronary atherosclerosis.  Degenerative disc disease at L5-S1. Chronic bilateral maxillary sinusitis. Electronically Signed   By: Walter  Liebkemann M.D.   On: 07/26/2021 11:14   IR IMAGING GUIDED PORT INSERTION  Result Date: 08/06/2021 INDICATION: IV access needed for chemotherapy EXAM: Chest port placement using ultrasound and fluoroscopic guidance MEDICATIONS: Per EMR ANESTHESIA/SEDATION: Moderate (conscious) sedation was employed during this procedure. A total of Versed 1 mg and Fentanyl 50 mcg was administered intravenously. Moderate Sedation Time: 20 minutes. The patient's level of consciousness and vital signs were monitored continuously by radiology nursing throughout the procedure under my direct supervision. FLUOROSCOPY TIME:  Fluoroscopy Time: 0.4 minutes (1.3 mGy) COMPLICATIONS: None immediate. PROCEDURE: Informed written consent was obtained from the patient after a thorough discussion of the procedural risks, benefits and alternatives. All questions were addressed. Maximal Sterile Barrier Technique was utilized including caps, mask, sterile gowns, sterile gloves, sterile drape, hand hygiene and skin antiseptic. A timeout was performed prior to the initiation of the procedure. The patient was placed supine on the exam table. The right neck and chest was prepped and draped in the standard sterile fashion. A preliminary ultrasound of the right neck was performed and demonstrates a patent right internal jugular vein. A permanent ultrasound image was stored in the electronic medical record. The overlying skin was anesthetized with 1% Lidocaine. Using ultrasound guidance, access was obtained into the right internal jugular vein using a 21 gauge micropuncture set. A wire was advanced into the SVC, a short incision was made at the puncture site, and serial dilatation performed. Next, in an ipsilateral infraclavicular location, an incision was made at the site of   the subcutaneous reservoir. Blunt dissection was used to open a pocket  to contain the reservoir. A subcutaneous tunnel was then created from the port site to the puncture site. A(n) 8 Fr single lumen catheter was advanced through the tunnel. The catheter was attached to the port and this was placed in the subcutaneous pocket. Under fluoroscopic guidance, a peel away sheath was placed, and the catheter was trimmed to the appropriate length and was advanced into the central veins. The catheter length is 23 cm. The tip of the catheter lies near the superior cavoatrial junction. The port flushes and aspirates appropriately. The port was flushed and locked with heparinized saline. The port pocket was closed in 2 layers using 3-0 and 4-0 Vicryl/absorbable suture. Dermabond was also applied to both incisions. The patient tolerated the procedure well and was transferred to recovery in stable condition. IMPRESSION: Successful placement of a right-sided chest port via the right internal jugular vein. The port is ready for immediate use. Electronically Signed   By: Albin Felling M.D.   On: 08/06/2021 09:28      ASSESSMENT & PLAN:  1. Primary non-small cell carcinoma of upper lobe of left lung ([place])   2. Metastasis to bone ([place])   3. Encounter for antineoplastic chemotherapy   4. Goals of care, counseling/discussion    Cancer Staging  Primary non-small cell carcinoma of upper lobe of left lung ([facility]) Staging form: Lung, AJCC 8th Edition - Clinical: Stage IV (cT2b, cN1, cM1) - Signed by [name], MD on 08/06/2021   #Left upper lobe adenocarcinoma, cT2b, cN1 M1 I saw patient's PET scan after today's encounter as PET scan was ordered by his previous oncologist group and did not come to my in basket.. Patient received cycle 1 carboplatin Taxol today.  He has started on radiation.  07/26/2021, PET scan showed 3.8 cm hypermetabolic left upper lobe mass with hypermetabolic left hilar and infrahilar adenopathy.  There is approximately 8 scattered metastatic lesions in the skeleton. Mixed  density photopenic lesion in the left cerebellum. characterized as hemorrhage on recent prior imaging workups. These findings were all increase his staging to stage IV adeno carcinoma with bone metastasis. The new findings may be a result of superior sensitivity of detecting bone metastasis with PET scan compared to CT scan or this could be due to a rapid progression during the interval between his CT scan and PET scan.  I recommend to switch treatment plan to systemic chemotherapy treatment with Keytruda +/- chemotherapy [carboplatin and Alimta]  PD-L1 is above 50%.  Theoretically, patient may benefit from monotherapy Keytruda, and combination of chemotherapy with Keytruda as an acceptable alternation given that progression might preclude the option of chemotherapy in the second-line setting,  I called both patient and his daughter [name] and updated the PET scan findings, recommendation of switching to systemic immunotherapy +/- chemotherapy..  Patient and daughter agree with the plan.  Shared decision was made to proceed with combination chemotherapy/immunotherapy with nausea showed of dropping chemotherapy if he does not tolerate. I also discussed the case with Dr. Baruch Gouty who agrees with holding radiation and proceeding with systemic chemotherapy treatments. Plan lab MD carboplatin/Alimta/Keytruda next week. We will schedule patient to get vitamin B12 injection.  Dexamethasone premed, folic acid prescriptions were sent to pharmacy.  We will schedule patient lab MD around 08/15/2021 to start with first cycle of carboplatin/Alimta/Keytruda. All questions were answered. The patient knows to call the clinic with any problems questions or concerns.  Thank you for  this kind referral and the opportunity to participate in the care of this patient. A copy of today's note is routed to referring provider   Zhou Yu, MD, [name] [facility] 08/06/2021   

## 2021-08-06 NOTE — Progress Notes (Signed)
Met with patient during follow up visit with Dr. Tasia Catchings to start chemotherapy today. All questions answered during visit. Pt escorted to infusion via wheelchair and informed will be given appts prior to leaving today. Instructed pt to call with any questions or needs. Pt verbalized understanding.

## 2021-08-06 NOTE — Progress Notes (Signed)
DISCONTINUE ON PATHWAY REGIMEN - Non-Small Cell Lung     Administer weekly:     Paclitaxel      Carboplatin   **Always confirm dose/schedule in your pharmacy ordering system**  REASON: Other Reason PRIOR TREATMENT: [id number]: Carboplatin AUC=2 + Paclitaxel 45 mg/m2 Weekly During Radiation TREATMENT RESPONSE: Unable to Evaluate  START ON PATHWAY REGIMEN - Non-Small Cell Lung     A cycle is every 21 days:     Pembrolizumab      Pemetrexed      Carboplatin   **Always confirm dose/schedule in your pharmacy ordering system**  Patient Characteristics: Stage IV Metastatic, Nonsquamous, Molecular Analysis Completed, Molecular Alteration Present and Targeted Therapy Exhausted OR EGFR Exon 20+ or KRAS G12C+ or HER2+ Present and No Prior Chemo/Immunotherapy OR No Alteration Present, Initial  Chemotherapy/Immunotherapy, PS = 0, 1, No Alteration Present, No Alteration Present, Candidate for Immunotherapy, PD-L1 Expression Positive  ? 50% (TPS) and Immunotherapy Candidate Therapeutic Status: Stage IV Metastatic Histology: Nonsquamous Cell Broad Molecular Profiling Status: Engineer, manufacturing Analysis Results: No Alteration Present ECOG Performance Status: 1 Chemotherapy/Immunotherapy Line of Therapy: Initial Chemotherapy/Immunotherapy EGFR Exons 18-21 Mutation Testing Status: Completed and Negative ALK Fusion/Rearrangement Testing Status: Completed and Negative BRAF V600 Mutation Testing Status: Completed and Negative KRAS G12C Mutation Testing Status: Completed and Negative MET Exon 14 Mutation Testing Status: Completed and Negative RET Fusion/Rearrangement Testing Status: Completed and Negative HER2 Mutation Testing Status: Completed and Negative NTRK Fusion/Rearrangement Testing Status: Completed and Negative ROS1 Fusion/Rearrangement Testing Status: Completed and Negative Immunotherapy Candidate Status: Candidate for Immunotherapy PD-L1 Expression Status: PD-L1  Positive ? 50% (TPS) Intent of Therapy: Non-Curative / Palliative Intent, Discussed with Patient

## 2021-08-06 NOTE — Progress Notes (Signed)
[place] at [facility] Telephone:(336) [phone] Fax:(336) [phone]   Name: Micheal Donovan Date: 08/06/2021 MRN: [id number]  DOB: 11-Jan-1951  Patient Care Team: [name], MD as PCP - General (Family Medicine) [name], RN as Oncology Nurse Navigator    REASON FOR CONSULTATION: Micheal Donovan is a 71 y.o. male with multiple medical problems including non-small cell lung cancer, history of subacute intraparenchymal hemorrhage, PE.  Patient is on systemic chemotherapy and XRT.  He has had decreased appetite and depression and was referred to palliative care to help address goals and manage ongoing symptoms.  SOCIAL HISTORY:     reports that he quit smoking about 31 years ago. His smoking use included cigarettes. He has never used smokeless tobacco. He reports that he does not drink alcohol and does not use drugs.  Patient is married.  He has a daughter who is his primary caregiver.  Patient previously worked as an Customer service manager  ADVANCE DIRECTIVES:  On file  CODE STATUS:   PAST MEDICAL HISTORY: Past Medical History:  Diagnosis Date   Allergy    BPH (benign prostatic hypertrophy)    Cancer ([place])    Melanoma on Neck    2008   Carotid artery occlusion    Diabetes mellitus    ED (erectile dysfunction)    Hyperlipidemia    Hypertension    Retinopathy due to secondary DM ([place])     PAST SURGICAL HISTORY:  Past Surgical History:  Procedure Laterality Date   BRONCHIAL NEEDLE ASPIRATION BIOPSY  06/17/2021   Procedure: BRONCHIAL NEEDLE ASPIRATION BIOPSIES;  Surgeon: Candee Furbish, MD;  Location: [place];  Service: Pulmonary;;   IR IMAGING GUIDED PORT INSERTION  08/05/2021   MELANOMA EXCISION  2008   Left side of neck   RADIOLOGY WITH ANESTHESIA N/A 04/05/2020   Procedure: MRI SPINE WITOUT CONTRAST;  Surgeon: Radiologist, Medication, MD;  Location: [place];  Service: Radiology;  Laterality: N/A;   TONSILLECTOMY      VIDEO BRONCHOSCOPY WITH ENDOBRONCHIAL ULTRASOUND N/A 06/17/2021   Procedure: VIDEO BRONCHOSCOPY WITH ENDOBRONCHIAL ULTRASOUND;  Surgeon: Candee Furbish, MD;  Location: [facility] ENDOSCOPY;  Service: Pulmonary;  Laterality: N/A;    HEMATOLOGY/ONCOLOGY HISTORY:  Oncology History Overview Note  Diagnosis: Stage IIB T2b N1 M0 adenocarcinoma of the LUL, poorly differentiated   Medical Oncologist: Mohamed Radiation Oncologist: Tammi Klippel Pulmonologist: [place]   Lung cancer ([place])  06/11/2021 Imaging   CT Angio Head and Neck WWO: patchy consolidative LUL opacity, suspicious for pneumonia   06/12/2021 Imaging   Chest x-ray: 4.4 cm L midlung pulmonary mass concerning for malignancy    06/13/2021 Imaging   CT Chest W: .3 x 3.6 x 3.3 cm lobular spiculated mass in the posterior L upper lobe with tethering to the lateral pleura and major fissure consistent with a primary bronchogenic neoplasm with associated metastatic L hilar lymphadenopathy  MRI Brain WWO: known L cerebellar hematoma 4.6 x 3.5 x 2.7 cm without discernible enhancement to suggest underlying mass lesion or metastatic disease   06/17/2021 Initial Diagnosis   Presented with acute dizziness   06/17/2021 Initial Biopsy   Station 11L FNA: poorly differentiated non-small cell carcinoma, consistent with adenocarcinoma    06/18/2021 Imaging   CT Abdomen Pelvis W: no metastatic disease in abdomen/pelvis    Primary non-small cell carcinoma of upper lobe of left lung ([place])  07/16/2021 Initial Diagnosis   Primary non-small cell carcinoma of upper lobe of left lung (  [place])    07/17/2021 Cancer Staging   Staging form: Lung, AJCC 8th Edition - Clinical: Stage IIB (cT2b, cN1, cM0) - Signed by Curt Bears, MD on 07/17/2021    08/06/2021 -  Chemotherapy   Patient is on Treatment Plan : LUNG Carboplatin / Paclitaxel + XRT q7d        ALLERGIES:  is allergic to niaspan [niacin].  MEDICATIONS:  Current Outpatient Medications  Medication Sig Dispense  Refill   acetaminophen (TYLENOL) 325 MG tablet Take 2 tablets (650 mg total) by mouth every 4 (four) hours as needed for mild pain (or temp > 37.5 C (99.5 F)).     amLODipine (NORVASC) 10 MG tablet Take 1 tablet (10 mg total) by mouth daily. 30 tablet 0   apixaban (ELIQUIS) 5 MG TABS tablet Take 1 tablet (5 mg total) by mouth 2 (two) times daily. 60 tablet 1   atorvastatin (LIPITOR) 40 MG tablet Take 1 tablet (40 mg total) by mouth daily. 30 tablet 0   butalbital-acetaminophen-caffeine (FIORICET) 50-325-40 MG tablet Take 1 tablet by mouth every 6 (six) hours as needed for headache. 10 tablet 0   insulin aspart (NOVOLOG FLEXPEN) 100 UNIT/ML FlexPen Inject 6 Units into the skin 3 (three) times daily with meals. 15 mL 11   insulin glargine (LANTUS) 100 UNIT/ML Solostar Pen Inject 25 Units into the skin daily. 15 mL 11   lidocaine-prilocaine (EMLA) cream Apply to affected area once 30 g 3   loratadine (CLARITIN) 10 MG tablet Take 1 tablet (10 mg total) by mouth daily. 30 tablet 0   LORazepam (ATIVAN) 0.5 MG tablet 1 tablet p.o. 30 minutes before the PET scan.  May repeat x1 if needed. (Patient not taking: Reported on 08/06/2021) 2 tablet 0   losartan (COZAAR) 50 MG tablet TAKE 1 TABLET(50 MG) BY MOUTH DAILY 30 tablet 0   meclizine (ANTIVERT) 12.5 MG tablet Take 1 tablet (12.5 mg total) by mouth 2 (two) times daily as needed for dizziness. 30 tablet 0   metoprolol succinate (TOPROL-XL) 25 MG 24 hr tablet Take 0.5 tablets (12.5 mg total) by mouth daily. 30 tablet 0   ondansetron (ZOFRAN) 8 MG tablet Take 1 tablet (8 mg total) by mouth 2 (two) times daily as needed for refractory nausea / vomiting. Start on day 3 after chemo. 30 tablet 1   oxyCODONE-acetaminophen (PERCOCET) 7.5-325 MG tablet Take 1 tablet by mouth every 4 (four) hours as needed for severe pain. 60 tablet 0   pantoprazole (PROTONIX) 40 MG tablet Take 1 tablet (40 mg total) by mouth daily at 6 (six) AM. 90 tablet 0   potassium chloride SA  (KLOR-CON M) 20 MEQ tablet Take 1 tablet (20 mEq total) by mouth daily. 7 tablet 0   prochlorperazine (COMPAZINE) 10 MG tablet Take 1 tablet (10 mg total) by mouth every 6 (six) hours as needed (Nausea or vomiting). 30 tablet 1   topiramate (TOPAMAX) 100 MG tablet Take 1 tablet (100 mg total) by mouth at bedtime. 30 tablet 0   UNABLE TO FIND PLEASE CHECK FASTING BLOOD GLUCOSE EVERY MORNING 1 each 0   Vitamin D, Ergocalciferol, (DRISDOL) 1.25 MG (50000 UNIT) CAPS capsule Take 1 capsule (50,000 Units total) by mouth every 7 (seven) days. 5 capsule 0   No current facility-administered medications for this visit.   Facility-Administered Medications Ordered in Other Visits  Medication Dose Route Frequency Provider Last Rate Last Admin   CARBOplatin (PARAPLATIN) 170 mg in sodium chloride 0.9 % 100 mL  chemo infusion  170 mg Intravenous Once [name], MD       heparin lock flush 100 unit/mL  500 Units Intracatheter Once PRN [name], MD       PACLitaxel (TAXOL) 78 mg in sodium chloride 0.9 % 250 mL chemo infusion (</= 57m/m2)  45 mg/m2 (Treatment Plan Recorded) Intravenous Once YEarlie Server MD 63 mL/hr at 08/06/21 1101 Rate Change at 08/06/21 1101    VITAL SIGNS: There were no vitals taken for this visit. There were no vitals filed for this visit.  Estimated body mass index is 21.52 kg/m as calculated from the following:   Height as of 08/05/21: _0  (1.702 m).   Weight as of an earlier encounter on 08/06/21: 137 lb 6.4 oz (62.3 kg).  LABS: CBC:    Component Value Date/Time   WBC 10.4 08/06/2021 0829   HGB 11.3 (L) 08/06/2021 0829   HGB 13.4 07/17/2021 1345   HCT 34.9 (L) 08/06/2021 0829   PLT 447 (H) 08/06/2021 0829   PLT 373 07/17/2021 1345   MCV 89.9 08/06/2021 0829   NEUTROABS 6.9 08/06/2021 0829   LYMPHSABS 2.1 08/06/2021 0829   MONOABS 0.8 08/06/2021 0829   EOSABS 0.5 08/06/2021 0829   BASOSABS 0.1 08/06/2021 0829   Comprehensive Metabolic Panel:    Component Value Date/Time   NA  136 08/06/2021 0829   K 3.9 08/06/2021 0829   CL 104 08/06/2021 0829   CO2 24 08/06/2021 0829   BUN 12 08/06/2021 0829   CREATININE 0.53 (L) 08/06/2021 0829   CREATININE 0.76 07/17/2021 1345   CREATININE 0.89 06/10/2021 1210   GLUCOSE 108 (H) 08/06/2021 0829   CALCIUM 8.9 08/06/2021 0829   AST 13 (L) 08/06/2021 0829   AST 18 07/17/2021 1345   ALT 11 08/06/2021 0829   ALT 20 07/17/2021 1345   ALKPHOS 104 08/06/2021 0829   BILITOT 0.3 08/06/2021 0829   BILITOT 0.6 07/17/2021 1345   PROT 7.1 08/06/2021 0829   ALBUMIN 3.5 08/06/2021 0829    RADIOGRAPHIC STUDIES: NM PET Image Initial (PI) Skull Base To Thigh  Result Date: 07/26/2021 CLINICAL DATA:  Initial treatment strategy for left upper lobe lung cancer. EXAM: NUCLEAR MEDICINE PET SKULL BASE TO THIGH TECHNIQUE: 7.4 mCi F-18 FDG was injected intravenously. Full-ring PET imaging was performed from the skull base to thigh after the radiotracer. CT data was obtained and used for attenuation correction and anatomic localization. Fasting blood glucose: 155 mg/dl COMPARISON:  Multiple exams, including CT exams from 06/13/2021 and 06/18/2021 FINDINGS: Mediastinal blood pool activity: SUV max 1.8 Liver activity: SUV max NA NECK: Mixed density photopenic lesion in the left cerebellum characterized as hemorrhage on recent prior imaging workups. Incidental CT findings: Chronic bilateral maxillary sinusitis. Bilateral common carotid atherosclerotic calcification. CHEST: The dominant 3.8 cm left upper lobe mass has a maximum SUV of 19.6, compatible with malignancy. 1.0 cm left hilar node on image 83 series 2, maximum SUV 6.7, compatible with malignant involvement. The left infrahilar node has low-grade activity with maximum SUV of 3.4, likely malignant. There is surrounding ground-glass opacity and airspace opacity, likely from surrounding pneumonitis although entity such as pulmonary hemorrhage or lymphangitic carcinomatosis cannot be readily excluded. The  nodular bulging along the fissure potentially extending into the lower lobe on image 86 of series 2 is probably hypermetabolic although difficult to separate from the main hypermetabolic mass. Incidental CT findings: Coronary, aortic arch, and branch vessel atherosclerotic vascular disease. ABDOMEN/PELVIS: No significant abnormal hypermetabolic activity in this  region. Incidental CT findings: Atherosclerosis is present, including aortoiliac atherosclerotic disease. SKELETON: Hypermetabolic skeletal metastatic lesions are observed. These include the left seventh rib laterally with associated pathologic fracture (maximum SUV 10.3), the left T12 pedicle, the left L1 vertebral body, anterior L2 vertebral body, the posterior L5 vertebral body (maximum SUV 16.5), the right posterior iliac bone, the right anterior iliac bone, and the left posterior iliac bone adjacent to the SI joint. The right anterior iliac bone lesion has a maximum SUV of 15.9 and demonstrates early cortical breakthrough laterally on image 197 series 2. These lesions are subtle and where visible, generally lucent. Incidental CT findings: Bilateral degenerative glenohumeral arthropathy. Degenerative disc disease at L5-S1. IMPRESSION: 1. 3.8 cm hypermetabolic left upper lobe mass with hypermetabolic left hilar and infrahilar adenopathy, as well as approximately 8 scattered metastatic lesions in the skeleton. 2. Mixed density photopenic lesion in the left cerebellum, characterized as hemorrhage on recent prior imaging workups. 3. Aortic Atherosclerosis (ICD10-I70.0). Coronary atherosclerosis. Degenerative disc disease at L5-S1. Chronic bilateral maxillary sinusitis. Electronically Signed   By: Van Clines M.D.   On: 07/26/2021 11:14   IR IMAGING GUIDED PORT INSERTION  Result Date: 08/06/2021 INDICATION: IV access needed for chemotherapy EXAM: Chest port placement using ultrasound and fluoroscopic guidance MEDICATIONS: Per EMR ANESTHESIA/SEDATION:  Moderate (conscious) sedation was employed during this procedure. A total of Versed 1 mg and Fentanyl 50 mcg was administered intravenously. Moderate Sedation Time: 20 minutes. The patient's level of consciousness and vital signs were monitored continuously by radiology nursing throughout the procedure under my direct supervision. FLUOROSCOPY TIME:  Fluoroscopy Time: 0.4 minutes (1.3 mGy) COMPLICATIONS: None immediate. PROCEDURE: Informed written consent was obtained from the patient after a thorough discussion of the procedural risks, benefits and alternatives. All questions were addressed. Maximal Sterile Barrier Technique was utilized including caps, mask, sterile gowns, sterile gloves, sterile drape, hand hygiene and skin antiseptic. A timeout was performed prior to the initiation of the procedure. The patient was placed supine on the exam table. The right neck and chest was prepped and draped in the standard sterile fashion. A preliminary ultrasound of the right neck was performed and demonstrates a patent right internal jugular vein. A permanent ultrasound image was stored in the electronic medical record. The overlying skin was anesthetized with 1% Lidocaine. Using ultrasound guidance, access was obtained into the right internal jugular vein using a 21 gauge micropuncture set. A wire was advanced into the SVC, a short incision was made at the puncture site, and serial dilatation performed. Next, in an ipsilateral infraclavicular location, an incision was made at the site of the subcutaneous reservoir. Blunt dissection was used to open a pocket to contain the reservoir. A subcutaneous tunnel was then created from the port site to the puncture site. A(n) 8 Fr single lumen catheter was advanced through the tunnel. The catheter was attached to the port and this was placed in the subcutaneous pocket. Under fluoroscopic guidance, a peel away sheath was placed, and the catheter was trimmed to the appropriate length  and was advanced into the central veins. The catheter length is 23 cm. The tip of the catheter lies near the superior cavoatrial junction. The port flushes and aspirates appropriately. The port was flushed and locked with heparinized saline. The port pocket was closed in 2 layers using 3-0 and 4-0 Vicryl/absorbable suture. Dermabond was also applied to both incisions. The patient tolerated the procedure well and was transferred to recovery in stable condition. IMPRESSION: Successful placement of a  right-sided chest port via the right internal jugular vein. The port is ready for immediate use. Electronically Signed   By: Albin Felling M.D.   On: 08/06/2021 09:28    PERFORMANCE STATUS (ECOG) : 2 - Symptomatic, <50% confined to bed  Review of Systems Unless otherwise noted, a complete review of systems is negative.  Physical Exam General: NAD Pulmonary: Unlabored Extremities: no edema, no joint deformities Skin: no rashes Neurological: Weakness but otherwise nonfocal  IMPRESSION: I met with patient in infusion while he was receiving treatment.  We discussed the significant and life-changing impact associated with his cancer diagnosis.  In addition to that, patient's wife was recently hospitalized and has been on the ventilator requiring tracheostomy.  Patient admits to feeling down at times but denies depression.  I attempted to validate his feelings in the context of his recent stressors. Patient has quickly lost his independence, health, and with the issues of his wife.   Patient has had poor appetite but he says that this is slowly improving.  Patient did not seem interested in an antidepressant today.  If interested, mirtazapine would likely be a good choice as it also can be associated with improvement in appetite.  He denies anxiety.  Symptomatically, he reports occasional back pain and headache that is well managed with C codon, which patient says he is not taking regularly.  I do think  the patient would benefit from as much supportive care as we can provide.  I will also consult Teresita and Joli to meet with him.    With patient's permission, I also called and spoke his daughter.  She feels like patient is coping reasonably well.  PLAN: -Continue current scope of treatment -Referrals to LCSW and Nutrition -Follow-up telephone visit 1 month   Patient expressed understanding and was in agreement with this plan. He also understands that He can call the clinic at any time with any questions, concerns, or complaints.     Time Total: 20 minutes  Visit consisted of counseling and education dealing with the complex and emotionally intense issues of symptom management and palliative care in the setting of serious and potentially life-threatening illness.Greater than 50%  of this time was spent counseling and coordinating care related to the above assessment and plan.  Signed by: [name], [name], NP-C

## 2021-08-06 NOTE — Patient Instructions (Signed)
[facility] AT [place]  Discharge Instructions: Thank you for choosing [facility] to provide your oncology and hematology care.  If you have a lab appointment with the [facility], please go directly to the [facility] and check in at the registration area.  Wear comfortable clothing and clothing appropriate for easy access to any Portacath or PICC line.   We strive to give you quality time with your provider. You may need to reschedule your appointment if you arrive late (15 or more minutes).  Arriving late affects you and other patients whose appointments are after yours.  Also, if you miss three or more appointments without notifying the office, you may be dismissed from the clinic at the provider's discretion.      For prescription refill requests, have your pharmacy contact our office and allow 72 hours for refills to be completed.    Today you received the following chemotherapy and/or immunotherapy agents: TAXOL, CARBOPLATIN.   To help prevent nausea and vomiting after your treatment, we encourage you to take your nausea medication as directed.  BELOW ARE SYMPTOMS THAT SHOULD BE REPORTED IMMEDIATELY: *FEVER GREATER THAN 100.4 F (38 C) OR HIGHER *CHILLS OR SWEATING *NAUSEA AND VOMITING THAT IS NOT CONTROLLED WITH YOUR NAUSEA MEDICATION *UNUSUAL SHORTNESS OF BREATH *UNUSUAL BRUISING OR BLEEDING *URINARY PROBLEMS (pain or burning when urinating, or frequent urination) *BOWEL PROBLEMS (unusual diarrhea, constipation, pain near the anus) TENDERNESS IN MOUTH AND THROAT WITH OR WITHOUT PRESENCE OF ULCERS (sore throat, sores in mouth, or a toothache) UNUSUAL RASH, SWELLING OR PAIN  UNUSUAL VAGINAL DISCHARGE OR ITCHING   Items with * indicate a potential emergency and should be followed up as soon as possible or go to the Emergency Department if any problems should occur.  Please show the CHEMOTHERAPY ALERT CARD or IMMUNOTHERAPY ALERT CARD at  check-in to the Emergency Department and triage nurse.  Should you have questions after your visit or need to cancel or reschedule your appointment, please contact [facility] CANCER [place] AT [place]  [phone] and follow the prompts.  Office hours are 8:00 a.m. to 4:30 p.m. Monday - Friday. Please note that voicemails left after 4:00 p.m. may not be returned until the following business day.  We are closed weekends and major holidays. You have access to a nurse at all times for urgent questions. Please call the main number to the clinic [phone] and follow the prompts.  For any non-urgent questions, you may also contact your provider using MyChart. We now offer e-Visits for anyone 87 and older to request care online for non-urgent symptoms. For details visit [email or web address].   Also download the MyChart app! Go to the app store, search "MyChart", open the app, select [place], and log in with your MyChart username and password.  Due to Covid, a mask is required upon entering the hospital/clinic. If you do not have a mask, one will be given to you upon arrival. For doctor visits, patients may have 1 support person aged 26 or older with them. For treatment visits, patients cannot have anyone with them due to current Covid guidelines and our immunocompromised population.

## 2021-08-07 ENCOUNTER — Encounter: Payer: Self-pay | Admitting: Oncology

## 2021-08-07 ENCOUNTER — Ambulatory Visit: Payer: PPO

## 2021-08-07 ENCOUNTER — Inpatient Hospital Stay (HOSPITAL_BASED_OUTPATIENT_CLINIC_OR_DEPARTMENT_OTHER): Payer: PPO | Admitting: Hospice and Palliative Medicine

## 2021-08-07 ENCOUNTER — Telehealth: Payer: Self-pay

## 2021-08-07 DIAGNOSIS — C3412 Malignant neoplasm of upper lobe, left bronchus or lung: Secondary | ICD-10-CM

## 2021-08-07 NOTE — Addendum Note (Signed)
Addended by: [name] on: 08/07/2021 04:50 PM   Modules accepted: Orders

## 2021-08-07 NOTE — Progress Notes (Signed)
Multidisciplinary Oncology Council Documentation  Micheal Donovan was presented by our [facility] on 08/07/2021, which included representatives from:  Palliative Care Dietitian  Physical/Occupational Therapist Nurse Navigator Genetics Speech Therapist Social work Survivorship RN Financial Navigator Research RN   Micheal Donovan currently presents with history of lung cancer  We reviewed previous medical and familial history, history of present illness, and recent lab results along with all available histopathologic and imaging studies. The [place] considered available treatment options and made the following recommendations/referrals:  SW, nutrition, PC  The MOC is a meeting of clinicians from various specialty areas who evaluate and discuss patients for whom a multidisciplinary approach is being considered. Final determinations in the plan of care are those of the provider(s).   Today's extended care, comprehensive team conference, Micheal Donovan was not present for the discussion and was not examined.

## 2021-08-07 NOTE — Telephone Encounter (Signed)
Per Dr. Tasia Catchings secure chat, she has talked to patient and we will proceed with chemo + immunotherapy.   Please schedule and inform/ update pt of appt details:   B12 inj on 6/8 cancel appts on 6/14 lab/MD/ carboplatin/ alimta/ keytruda NEW on 6/15 or 6/16.

## 2021-08-08 ENCOUNTER — Ambulatory Visit: Payer: PPO

## 2021-08-08 ENCOUNTER — Telehealth: Payer: Self-pay

## 2021-08-08 ENCOUNTER — Telehealth: Payer: Self-pay | Admitting: Oncology

## 2021-08-08 ENCOUNTER — Inpatient Hospital Stay: Payer: PPO

## 2021-08-08 DIAGNOSIS — Z5111 Encounter for antineoplastic chemotherapy: Secondary | ICD-10-CM | POA: Diagnosis not present

## 2021-08-08 DIAGNOSIS — C3412 Malignant neoplasm of upper lobe, left bronchus or lung: Secondary | ICD-10-CM

## 2021-08-08 MED ORDER — CYANOCOBALAMIN 1000 MCG/ML IJ SOLN
1000.0000 ug | Freq: Once | INTRAMUSCULAR | Status: AC
  Administered 2021-08-08: 1000 ug via INTRAMUSCULAR
  Filled 2021-08-08: qty 1

## 2021-08-08 NOTE — Telephone Encounter (Signed)
Spoke with Daughter to inform her of appts, they have been confirmed.KJ

## 2021-08-08 NOTE — Telephone Encounter (Signed)
Telephone call to patient for follow up after receiving first infusion.   Patient daughter Linus Orn states infusion went great.  States eating good and drinking plenty of fluids.   Denies any nausea or vomiting.  Tracey aware of the chemo changes on next visit with Keytruda and Alimta.  Encouraged patient daughter to call for any concerns or questions.

## 2021-08-09 ENCOUNTER — Encounter: Payer: Self-pay | Admitting: Adult Health

## 2021-08-09 ENCOUNTER — Encounter: Payer: Self-pay | Admitting: Oncology

## 2021-08-09 ENCOUNTER — Ambulatory Visit: Payer: PPO

## 2021-08-09 ENCOUNTER — Ambulatory Visit: Payer: PPO | Admitting: Physician Assistant

## 2021-08-12 ENCOUNTER — Ambulatory Visit: Payer: PPO

## 2021-08-13 ENCOUNTER — Ambulatory Visit: Payer: PPO

## 2021-08-14 ENCOUNTER — Other Ambulatory Visit: Payer: PPO

## 2021-08-14 ENCOUNTER — Ambulatory Visit: Payer: PPO | Admitting: Oncology

## 2021-08-14 ENCOUNTER — Ambulatory Visit: Payer: PPO

## 2021-08-14 ENCOUNTER — Other Ambulatory Visit: Payer: Self-pay

## 2021-08-14 NOTE — Telephone Encounter (Signed)
Pt daughter  call stated that her father needed refill on his medication , that his  oncologist would not be filling his medication, for his B/p or his cholesterol. However she would like to if he he need to continue take this . Pt has been out his medication. I advised pt that I would have to review this to determine  if he need to have the refill if so we can send this into the [facility] in [place].

## 2021-08-15 ENCOUNTER — Ambulatory Visit: Payer: PPO

## 2021-08-15 MED ORDER — AMLODIPINE BESYLATE 10 MG PO TABS: 10.0000 mg | ORAL_TABLET | Freq: Every day | ORAL | 0 refills | Status: DC

## 2021-08-15 MED ORDER — METOPROLOL SUCCINATE ER 25 MG PO TB24
12.5000 mg | ORAL_TABLET | Freq: Every day | ORAL | 0 refills | Status: DC
Start: 2021-08-15 — End: 2021-08-23

## 2021-08-15 MED ORDER — LOSARTAN POTASSIUM 50 MG PO TABS: ORAL_TABLET | ORAL | 0 refills | Status: DC

## 2021-08-15 MED ORDER — ATORVASTATIN CALCIUM 40 MG PO TABS: 40.0000 mg | ORAL_TABLET | Freq: Every day | ORAL | 0 refills | Status: DC

## 2021-08-15 MED FILL — Dexamethasone Sodium Phosphate Inj 100 MG/10ML: INTRAMUSCULAR | Qty: 1 | Status: AC

## 2021-08-15 MED FILL — Fosaprepitant Dimeglumine For IV Infusion 150 MG (Base Eq): INTRAVENOUS | Qty: 5 | Status: AC

## 2021-08-16 ENCOUNTER — Inpatient Hospital Stay: Payer: PPO

## 2021-08-16 ENCOUNTER — Ambulatory Visit: Payer: PPO

## 2021-08-16 ENCOUNTER — Encounter: Payer: Self-pay | Admitting: *Deleted

## 2021-08-16 ENCOUNTER — Encounter: Payer: Self-pay | Admitting: Oncology

## 2021-08-16 ENCOUNTER — Inpatient Hospital Stay (HOSPITAL_BASED_OUTPATIENT_CLINIC_OR_DEPARTMENT_OTHER): Payer: PPO | Admitting: Oncology

## 2021-08-16 DIAGNOSIS — M549 Dorsalgia, unspecified: Secondary | ICD-10-CM | POA: Insufficient documentation

## 2021-08-16 DIAGNOSIS — C3412 Malignant neoplasm of upper lobe, left bronchus or lung: Secondary | ICD-10-CM

## 2021-08-16 DIAGNOSIS — I2699 Other pulmonary embolism without acute cor pulmonale: Secondary | ICD-10-CM

## 2021-08-16 DIAGNOSIS — D6481 Anemia due to antineoplastic chemotherapy: Secondary | ICD-10-CM | POA: Insufficient documentation

## 2021-08-16 DIAGNOSIS — Z7189 Other specified counseling: Secondary | ICD-10-CM

## 2021-08-16 DIAGNOSIS — Z5111 Encounter for antineoplastic chemotherapy: Secondary | ICD-10-CM | POA: Diagnosis not present

## 2021-08-16 DIAGNOSIS — D539 Nutritional anemia, unspecified: Secondary | ICD-10-CM | POA: Insufficient documentation

## 2021-08-16 DIAGNOSIS — T451X5A Adverse effect of antineoplastic and immunosuppressive drugs, initial encounter: Secondary | ICD-10-CM | POA: Diagnosis not present

## 2021-08-16 DIAGNOSIS — D529 Folate deficiency anemia, unspecified: Secondary | ICD-10-CM | POA: Insufficient documentation

## 2021-08-16 DIAGNOSIS — C7951 Secondary malignant neoplasm of bone: Secondary | ICD-10-CM | POA: Diagnosis not present

## 2021-08-16 DIAGNOSIS — G893 Neoplasm related pain (acute) (chronic): Secondary | ICD-10-CM | POA: Diagnosis not present

## 2021-08-16 LAB — COMPREHENSIVE METABOLIC PANEL
ALT: 10 U/L (ref 0–44)
AST: 12 U/L — ABNORMAL LOW (ref 15–41)
Albumin: 3.6 g/dL (ref 3.5–5.0)
Alkaline Phosphatase: 103 U/L (ref 38–126)
Anion gap: 8 (ref 5–15)
BUN: 15 mg/dL (ref 8–23)
CO2: 22 mmol/L (ref 22–32)
Calcium: 9.3 mg/dL (ref 8.9–10.3)
Chloride: 103 mmol/L (ref 98–111)
Creatinine, Ser: 0.56 mg/dL — ABNORMAL LOW (ref 0.61–1.24)
GFR, Estimated: >60 mL/min (ref >60)
Glucose, Bld: 214 mg/dL — ABNORMAL HIGH (ref 70–99)
Potassium: 4.3 mmol/L (ref 3.5–5.1)
Sodium: 133 mmol/L — ABNORMAL LOW (ref 135–145)
Total Bilirubin: 0.4 mg/dL (ref 0.3–1.2)
Total Protein: 7.3 g/dL (ref 6.5–8.1)

## 2021-08-16 LAB — CBC WITH DIFFERENTIAL/PLATELET
Abs Immature Granulocytes: 0.09 10*3/uL — ABNORMAL HIGH (ref 0.00–0.07)
Basophils Absolute: 0 10*3/uL (ref 0.0–0.1)
Basophils Relative: 0 %
Eosinophils Absolute: 0 10*3/uL (ref 0.0–0.5)
Eosinophils Relative: 0 %
HCT: 32.5 % — ABNORMAL LOW (ref 39.0–52.0)
Hemoglobin: 10.7 g/dL — ABNORMAL LOW (ref 13.0–17.0)
Immature Granulocytes: 1 %
Lymphocytes Relative: 14 %
Lymphs Abs: 1.5 10*3/uL (ref 0.7–4.0)
MCH: 30.1 pg (ref 26.0–34.0)
MCHC: 32.9 g/dL (ref 30.0–36.0)
MCV: 91.3 fL (ref 80.0–100.0)
Monocytes Absolute: 0.6 10*3/uL (ref 0.1–1.0)
Monocytes Relative: 6 %
Neutro Abs: 8.4 10*3/uL — ABNORMAL HIGH (ref 1.7–7.7)
Neutrophils Relative %: 79 %
Platelets: 401 10*3/uL — ABNORMAL HIGH (ref 150–400)
RBC: 3.56 MIL/uL — ABNORMAL LOW (ref 4.22–5.81)
RDW: 14.9 % (ref 11.5–15.5)
WBC: 10.6 10*3/uL — ABNORMAL HIGH (ref 4.0–10.5)
nRBC: 0 % (ref 0.0–0.2)

## 2021-08-16 LAB — T4, FREE: Free T4: 0.63 ng/dL (ref 0.61–1.12)

## 2021-08-16 LAB — TSH: TSH: 0.432 u[IU]/mL (ref 0.350–4.500)

## 2021-08-16 MED ORDER — SODIUM CHLORIDE 0.9 % IV SOLN
400.0000 mg/m2 | Freq: Once | INTRAVENOUS | Status: AC
  Administered 2021-08-16: 700 mg via INTRAVENOUS
  Filled 2021-08-16: qty 20

## 2021-08-16 MED ORDER — SODIUM CHLORIDE 0.9 % IV SOLN
10.0000 mg | Freq: Once | INTRAVENOUS | Status: AC
  Administered 2021-08-16: 10 mg via INTRAVENOUS
  Filled 2021-08-16: qty 10

## 2021-08-16 MED ORDER — PALONOSETRON HCL INJECTION 0.25 MG/5ML
0.2500 mg | Freq: Once | INTRAVENOUS | Status: AC
  Administered 2021-08-16: 0.25 mg via INTRAVENOUS
  Filled 2021-08-16: qty 5

## 2021-08-16 MED ORDER — SODIUM CHLORIDE 0.9 % IV SOLN
Freq: Once | INTRAVENOUS | Status: AC
  Filled 2021-08-16: qty 250

## 2021-08-16 MED ORDER — SODIUM CHLORIDE 0.9 % IV SOLN
200.0000 mg | Freq: Once | INTRAVENOUS | Status: AC
  Administered 2021-08-16: 200 mg via INTRAVENOUS
  Filled 2021-08-16: qty 8

## 2021-08-16 MED ORDER — SODIUM CHLORIDE 0.9 % IV SOLN
385.2000 mg | Freq: Once | INTRAVENOUS | Status: AC
  Administered 2021-08-16: 390 mg via INTRAVENOUS
  Filled 2021-08-16: qty 39

## 2021-08-16 MED ORDER — HEPARIN SOD (PORK) LOCK FLUSH 100 UNIT/ML IV SOLN
500.0000 [IU] | Freq: Once | INTRAVENOUS | Status: AC | PRN
  Filled 2021-08-16: qty 5

## 2021-08-16 MED ORDER — SODIUM CHLORIDE 0.9% FLUSH
10.0000 mL | INTRAVENOUS | Status: DC | PRN
  Administered 2021-08-16: 10 mL via INTRAVENOUS
  Filled 2021-08-16: qty 10

## 2021-08-16 MED ORDER — HEPARIN SOD (PORK) LOCK FLUSH 100 UNIT/ML IV SOLN
INTRAVENOUS | Status: AC
  Administered 2021-08-16: 500 [IU]
  Filled 2021-08-16: qty 5

## 2021-08-16 MED ORDER — HEPARIN SOD (PORK) LOCK FLUSH 100 UNIT/ML IV SOLN
500.0000 [IU] | Freq: Once | INTRAVENOUS | Status: DC
  Filled 2021-08-16: qty 5

## 2021-08-16 MED ORDER — SODIUM CHLORIDE 0.9 % IV SOLN
150.0000 mg | Freq: Once | INTRAVENOUS | Status: AC
  Administered 2021-08-16: 150 mg via INTRAVENOUS
  Filled 2021-08-16: qty 150

## 2021-08-16 NOTE — Progress Notes (Signed)
Met with patient and his daughter during follow up visit with Dr. Tasia Catchings. All questions answered regarding treatment adjustment. Instructed to call with any questions or needs. Pt and daughter verbalized understanding. Nothing further needed at this time.

## 2021-08-16 NOTE — Progress Notes (Signed)
Hematology/Oncology Progress note [phone] [phone]            Patient Care Team: [name], MD as PCP - General (Family Medicine) [name], RN as Oncology Nurse Navigator   ASSESSMENT & PLAN:   Primary non-small cell carcinoma of upper lobe of left lung [facility]) Labs are reviewed and discussed with patient. Proceed with carboplatin/Alimta/Keytruda today.   Goals of care, counseling/discussion Discussed with patient and family  Metastasis to bone [facility]) Discussed about option of palliative radiation to metastatic site for pain control.   Neoplasm related pain Percocet PRN. He rarely utilize it.   Encounter for antineoplastic chemotherapy Chemotherapy plan as listed above  Pulmonary embolus (HCC) Continue Eliquis 32m BID  Anemia due to antineoplastic chemotherapy Hb trends down. Monitor counts.   No orders of the defined types were placed in this encounter.  Treatment today.  1 week. Lab NP +/- IVF 2 weeks Lab NP  +/- IVF Lab MD carboplatin Alimta kBeryle Flock 3 weeks  All questions were answered. The patient knows to call the clinic with any problems, questions or concerns.  ZEarlie Server MD, [name] [facility] 08/16/2021      CHIEF COMPLAINTS/REASON FOR VISIT:  non-small cell lung cancer  HISTORY OF PRESENTING ILLNESS:   Micheal VOORHISis a  71y.o.  male presents for follow up of Non-small cell lung cancer.  Oncology History Overview Note  Diagnosis: Stage IIB T2b N1 M0 adenocarcinoma of the LUL, poorly differentiated     [place]small [place]  06/13/2021 Initial Diagnosis   Primary non-small cell carcinoma of upper lobe of left lung ([facility] -06/20/2021 - [facility]27/2021[facility]. CT head showed acute to subacute intraparenchymal hemorrhage involving the left cerebellum.  Surrounding low-density  vasogenic edema.  [facility] This was further evaluated by CT angiogram of the neck which showed bulky calcified plaques/stenosis of carotid artery, Followed by MRI brain. 06/12/2021, MRI of the brain showed no significant interval change in size of the left cerebellar intraparenchymal hematoma with unchanged regional mass effect and partial effacement of fourth ventricle but no upstream hydrocephalus. There is no discernible enhancement to suggest underlying mass lesion, though acute blood products could mask enhancement.   06/12/2021 a chest x-ray showed a 4.4 cm left middle lobe. 06/13/2021, CT chest with contrast showed a 4.3 x 3.6 x 3.3 cm lobular spiculated mass in the posterior left upper lobe with T3 to the lateral pleura and major fissure.  Metastatic left hilar lymphadenopathy.  Peripheral micronodularity posterior right costophrenic sulcus.  Aortic atherosclerosis. 06/14/2021, CT abdomen pelvis showed right lower lobe pulmonary artery embolus.  No evidence of right heart strain.  No acute intra-abdominal or pelvic pathology.  Aortic atherosclerosis.  06/13/2021, patient underwent bronchoscopy with EBUS by Dr. STamala Julian  Biopsy from the fine-needle aspiration of station 11 mL showed malignant cells, consistent with poorly differentiated non-small cell carcinoma, consistent with adenocarcinoma.  Malignant cells are TTF-1 positive and negative for p40.  Negative for neuroendocrine markers.  Blood test and tissue sample were tested for Gardant 360  -PD-L1 TPS 97%, no actionable mutation on the blood testing. 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   07/17/2021 Cancer Staging   Staging form: Lung, AJCC 8th Edition - Clinical: Stage IV (cT2b, cN1, cM1) - Signed by YEarlie Server MD on 08/06/2021   08/06/2021 Imaging  I saw patient's PET scan after his visit with me on 08/06/21. PET scan was ordered by his previous oncologist group and did not come to my in  basket.. Patient received cycle 1 carboplatin Taxol today.  He has started on radiation.  5/26/3 PET scan showed 3.8 cm hypermetabolic left upper lobe mass with hypermetabolic left hilar and infrahilar adenopathy.  There is approximately 8 scattered metastatic lesions in the skeleton. Mixed density photopenic lesion in the left cerebellum. characterized as hemorrhage on recent prior imaging workups.    08/06/2021 - 08/06/2021 Chemotherapy   Patient is on Treatment Plan : LUNG Carboplatin / Paclitaxel + XRT q7d     08/16/2021 -  Chemotherapy   Switched to systemic chemotherapy with  Carboplatin (4.5) + Pemetrexed (400) + Pembrolizumab (200) D1 q21d Induction x 4 cycles      # Patient has a history of melanoma on his neck, treated in 2009. # History of hemorrhagic infarct left cerebellum  # Pulmonary embolism, on Eliquis 45m BID.   Today patient reports feeling well.  Tolerating his first dose of carboplatin/Taxol concurrently with radiation. Discussed with Dr. CBaruch Goutyabout patient's recent PET scan results and we both agree that patient has stage IV metastatic lung cancer with bone metastasis.  Plan is switched to systemic chemotherapy. Patient was accompanied by daughter today.  [place]  Patient reports tolerating last treatments.  He understands rationale and potential side effects of new chemotherapy plan. Headache/dizziness are stable, slightly improved, his weight is stable.  No nausea vomiting diarrhea fever or chills. + Tick bites when he gets into the woods.  Review of Systems  Constitutional:  Positive for fatigue. Negative for appetite change, chills, fever and unexpected weight change.  HENT:   Negative for hearing loss and voice change.   Eyes:  Negative for eye problems and icterus.  Respiratory:  Positive for cough and shortness of breath. Negative for chest tightness.   Cardiovascular:  Negative for chest pain and leg  swelling.  Gastrointestinal:  Negative for abdominal distention, abdominal pain and blood in stool.  Endocrine: Negative for hot flashes.  Genitourinary:  Negative for difficulty urinating, dysuria and frequency.   Musculoskeletal:  Negative for arthralgias.  Skin:  Negative for itching and rash.  Neurological:  Positive for headaches. Negative for extremity weakness, light-headedness and numbness.  Hematological:  Negative for adenopathy. Does not bruise/bleed easily.  Psychiatric/Behavioral:  Negative for confusion.     MEDICAL HISTORY:  Past Medical History:  Diagnosis Date   Allergy    BPH (benign prostatic hypertrophy)    [place]    Melanoma on Neck    2008   Carotid artery occlusion    Diabetes mellitus    ED (erectile dysfunction)    Hyperlipidemia    Hypertension    Retinopathy due to secondary DM ([facility]     SURGICAL HISTORY: Past Surgical History:  Procedure Laterality Date   BRONCHIAL NEEDLE ASPIRATION BIOPSY  06/17/2021   Procedure: BRONCHIAL NEEDLE ASPIRATION BIOPSIES;  Surgeon: SCandee Furbish MD;  [place]  Service: Pulmonary;;   IR IMAGING GUIDED PORT INSERTION  08/05/2021   MELANOMA EXCISION  2008   Left side of neck   RADIOLOGY WITH ANESTHESIA N/A 04/05/2020   Procedure: MRI SPINE WITOUT CONTRAST;  Surgeon: Radiologist, Medication, MD;  [place]  Service: Radiology;  Laterality: N/A;   TONSILLECTOMY     VIDEO BRONCHOSCOPY WITH ENDOBRONCHIAL ULTRASOUND N/A 06/17/2021  Procedure: VIDEO BRONCHOSCOPY WITH ENDOBRONCHIAL ULTRASOUND;  Surgeon: Candee Furbish, MD;  Location[facility] ENDOSCOPY;  Service: Pulmonary;  Laterality: N/A;    SOCIAL HISTORY: Social History   Socioeconomic History   Marital status: Married    Spouse name: Not on file   Number of children: Not on file   Years of education: Not on file   Highest education level: Not on file  Occupational History   Not on file  Tobacco Use   Smoking status: Former    Types:  Cigarettes    Quit date: 07/21/1990    Years since quitting: 31.0   Smokeless tobacco: Never  Vaping Use   Vaping Use: Never used  Substance and Sexual Activity   Alcohol use: No   Drug use: No   Sexual activity: Not on file  Other Topics Concern   Not on file  Social History Narrative   Not on file   Social Determinants of Health   Financial Resource Strain: Low Risk  (07/31/2021)   Overall Financial Resource Strain (CARDIA)    Difficulty of Paying Living Expenses: Not very hard  Food Insecurity: No Food Insecurity (07/31/2021)   Hunger Vital Sign    Worried About Running Out of Food in the Last Year: Never true    Ran Out of Food in the Last Year: Never true  Transportation Needs: No Transportation Needs (07/31/2021)   PRAPARE - Hydrologist (Medical): No    Lack of Transportation (Non-Medical): No  Physical Activity: Inactive (07/31/2021)   Exercise Vital Sign    Days of Exercise per Week: 0 days    Minutes of Exercise per Session: 0 min  Stress: Unknown (07/31/2021)   Independence    Feeling of Stress : Patient refused  Social Connections: Unknown (07/31/2021)   Social Connection and Isolation Panel [NHANES]    Frequency of Communication with Friends and Family: Three times a week    Frequency of Social Gatherings with Friends and Family: Three times a week    Attends Religious Services: Patient refused    Active Member of Clubs or Organizations: Patient refused    Attends Archivist Meetings: Patient refused    Marital Status: Married  Human resources officer Violence: Not At Risk (07/31/2021)   Humiliation, Afraid, Rape, and Kick questionnaire    Fear of Current or Ex-Partner: No    Emotionally Abused: No    Physically Abused: No    Sexually Abused: No    FAMILY HISTORY: Family History  Problem Relation Age of Onset   COPD Mother    Heart disease Father    Heart disease  Brother        MI at age 48   Stroke Neg Hx     ALLERGIES:  is allergic to niaspan [niacin].  MEDICATIONS:  Current Outpatient Medications  Medication Sig Dispense Refill   acetaminophen (TYLENOL) 325 MG tablet Take 2 tablets (650 mg total) by mouth every 4 (four) hours as needed for mild pain (or temp > 37.5 C (99.5 F)).     amLODipine (NORVASC) 10 MG tablet Take 1 tablet (10 mg total) by mouth daily. 30 tablet 0   apixaban (ELIQUIS) 5 MG TABS tablet Take 1 tablet (5 mg total) by mouth 2 (two) times daily. 60 tablet 1   atorvastatin (LIPITOR) 40 MG tablet Take 1 tablet (40 mg total) by mouth daily. 30 tablet 0   butalbital-acetaminophen-caffeine (FIORICET)  50-325-40 MG tablet Take 1 tablet by mouth every 6 (six) hours as needed for headache. 10 tablet 0   dexamethasone (DECADRON) 4 MG tablet Take 1 tab every 12 hours the day before pemetrexed chemo, then take 2 tabs once a day for 3 days starting the day after carboplatin. 30 tablet 1   folic acid (FOLVITE) 1 MG tablet Take 1 tablet (1 mg total) by mouth daily. Start 7 days before pemetrexed chemotherapy. Continue until 21 days after pemetrexed completed. 100 tablet 3   insulin aspart (NOVOLOG FLEXPEN) 100 UNIT/ML FlexPen Inject 6 Units into the skin 3 (three) times daily with meals. 15 mL 11   insulin glargine (LANTUS) 100 UNIT/ML Solostar Pen Inject 25 Units into the skin daily. 15 mL 11   loratadine (CLARITIN) 10 MG tablet Take 1 tablet (10 mg total) by mouth daily. 30 tablet 0   LORazepam (ATIVAN) 0.5 MG tablet 1 tablet p.o. 30 minutes before the PET scan.  May repeat x1 if needed. (Patient not taking: Reported on 08/06/2021) 2 tablet 0   losartan (COZAAR) 50 MG tablet TAKE 1 TABLET(50 MG) BY MOUTH DAILY 30 tablet 0   meclizine (ANTIVERT) 12.5 MG tablet Take 1 tablet (12.5 mg total) by mouth 2 (two) times daily as needed for dizziness. 30 tablet 0   metoprolol succinate (TOPROL-XL) 25 MG 24 hr tablet Take 0.5 tablets (12.5 mg total) by  mouth daily. 30 tablet 0   oxyCODONE-acetaminophen (PERCOCET) 7.5-325 MG tablet Take 1 tablet by mouth every 4 (four) hours as needed for severe pain. 60 tablet 0   pantoprazole (PROTONIX) 40 MG tablet Take 1 tablet (40 mg total) by mouth daily at 6 (six) AM. 90 tablet 0   potassium chloride SA (KLOR-CON M) 20 MEQ tablet Take 1 tablet (20 mEq total) by mouth daily. 7 tablet 0   topiramate (TOPAMAX) 100 MG tablet Take 1 tablet (100 mg total) by mouth at bedtime. 30 tablet 0   UNABLE TO FIND PLEASE CHECK FASTING BLOOD GLUCOSE EVERY MORNING 1 each 0   Vitamin D, Ergocalciferol, (DRISDOL) 1.25 MG (50000 UNIT) CAPS capsule Take 1 capsule (50,000 Units total) by mouth every 7 (seven) days. 5 capsule 0   No current facility-administered medications for this visit.     PHYSICAL EXAMINATION:  Vitals:   08/16/21 0832  BP: 119/65  Pulse: 70  Temp: (!) 96.1 F (35.6 C)   Filed Weights   08/16/21 0832  Weight: 139 lb (63 kg)    Physical Exam Constitutional:      General: He is not in acute distress.    Comments: Patient sits in the wheelchair  HENT:     Head: Normocephalic and atraumatic.  Eyes:     General: No scleral icterus. Cardiovascular:     Rate and Rhythm: Normal rate and regular rhythm.     Heart sounds: Normal heart sounds.  Pulmonary:     Effort: Pulmonary effort is normal. No respiratory distress.     Breath sounds: No wheezing.     Comments: Decreased breath sound bilaterally. Abdominal:     General: Bowel sounds are normal. There is no distension.     Palpations: Abdomen is soft.  Musculoskeletal:        General: No deformity. Normal range of motion.     Cervical back: Normal range of motion and neck supple.  Skin:    General: Skin is warm and dry.     Findings: No erythema or rash.  Neurological:  Mental Status: He is alert and oriented to person, place, and time. Mental status is at baseline.     Cranial Nerves: No cranial nerve deficit.     Coordination:  Coordination normal.  Psychiatric:        Mood and Affect: Mood normal.     LABORATORY DATA:  I have reviewed the data as listed Lab Results  Component Value Date   WBC 10.6 (H) 08/16/2021   HGB 10.7 (L) 08/16/2021   HCT 32.5 (L) 08/16/2021   MCV 91.3 08/16/2021   PLT 401 (H) 08/16/2021   Recent Labs    06/21/21 0505 07/17/21 1345 08/06/21 0829  NA 133* 138 136  K 3.8 3.1* 3.9  CL 102 104 104  CO2 _0 GLUCOSE 96 167* 108*  BUN _1 CREATININE 0.61 0.76 0.53*  CALCIUM 9.2 9.3 8.9  GFRNONAA >60 >60 >60  PROT 6.7 7.0 7.1  ALBUMIN 3.2* 3.7 3.5  AST 20 18 13*  ALT _2 ALKPHOS 122 123 104  BILITOT 0.6 0.6 0.3    Iron/TIBC/Ferritin/ %Sat No results found for: "IRON", "TIBC", "FERRITIN", "IRONPCTSAT"    RADIOGRAPHIC STUDIES: I have personally reviewed the radiological images as listed and agreed with the findings in the report. IR IMAGING GUIDED PORT INSERTION  Result Date: 08/06/2021 INDICATION: IV access needed for chemotherapy EXAM: Chest port placement using ultrasound and fluoroscopic guidance MEDICATIONS: Per EMR ANESTHESIA/SEDATION: Moderate (conscious) sedation was employed during this procedure. A total of Versed 1 mg and Fentanyl 50 mcg was administered intravenously. Moderate Sedation Time: 20 minutes. The patient's level of consciousness and vital signs were monitored continuously by radiology nursing throughout the procedure under my direct supervision. FLUOROSCOPY TIME:  Fluoroscopy Time: 0.4 minutes (1.3 mGy) COMPLICATIONS: None immediate. PROCEDURE: Informed written consent was obtained from the patient after a thorough discussion of the procedural risks, benefits and alternatives. All questions were addressed. Maximal Sterile Barrier Technique was utilized including caps, mask, sterile gowns, sterile gloves, sterile drape, hand hygiene and skin antiseptic. A timeout was performed prior to the initiation of the procedure. The patient was placed  supine on the exam table. The right neck and chest was prepped and draped in the standard sterile fashion. A preliminary ultrasound of the right neck was performed and demonstrates a patent right internal jugular vein. A permanent ultrasound image was stored in the electronic medical record. The overlying skin was anesthetized with 1% Lidocaine. Using ultrasound guidance, access was obtained into the right internal jugular vein using a 21 gauge micropuncture set. A wire was advanced into the SVC, a short incision was made at the puncture site, and serial dilatation performed. Next, in an ipsilateral infraclavicular location, an incision was made at the site of the subcutaneous reservoir. Blunt dissection was used to open a pocket to contain the reservoir. A subcutaneous tunnel was then created from the port site to the puncture site. A(n) 8 Fr single lumen catheter was advanced through the tunnel. The catheter was attached to the port and this was placed in the subcutaneous pocket. Under fluoroscopic guidance, a peel away sheath was placed, and the catheter was trimmed to the appropriate length and was advanced into the central veins. The catheter length is 23 cm. The tip of the catheter lies near the superior cavoatrial junction. The port flushes and aspirates appropriately. The port was flushed and locked with heparinized saline. The port pocket was closed in 2 layers using 3-0 and 4-0 Vicryl/absorbable  suture. Dermabond was also applied to both incisions. The patient tolerated the procedure well and was transferred to recovery in stable condition. IMPRESSION: Successful placement of a right-sided chest port via the right internal jugular vein. The port is ready for immediate use. Electronically Signed   By: Albin Felling M.D.   On: 08/06/2021 09:28   NM PET Image Initial (PI) Skull Base To Thigh  Result Date: 07/26/2021 CLINICAL DATA:  Initial treatment strategy for left upper lobe lung cancer. EXAM: NUCLEAR  MEDICINE PET SKULL BASE TO THIGH TECHNIQUE: 7.4 mCi F-18 FDG was injected intravenously. Full-ring PET imaging was performed from the skull base to thigh after the radiotracer. CT data was obtained and used for attenuation correction and anatomic localization. Fasting blood glucose: 155 mg/dl COMPARISON:  Multiple exams, including CT exams from 06/13/2021 and 06/18/2021 FINDINGS: Mediastinal blood pool activity: SUV max 1.8 Liver activity: SUV max NA NECK: Mixed density photopenic lesion in the left cerebellum characterized as hemorrhage on recent prior imaging workups. Incidental CT findings: Chronic bilateral maxillary sinusitis. Bilateral common carotid atherosclerotic calcification. CHEST: The dominant 3.8 cm left upper lobe mass has a maximum SUV of 19.6, compatible with malignancy. 1.0 cm left hilar node on image 83 series 2, maximum SUV 6.7, compatible with malignant involvement. The left infrahilar node has low-grade activity with maximum SUV of 3.4, likely malignant. There is surrounding ground-glass opacity and airspace opacity, likely from surrounding pneumonitis although entity such as pulmonary hemorrhage or lymphangitic carcinomatosis cannot be readily excluded. The nodular bulging along the fissure potentially extending into the lower lobe on image 86 of series 2 is probably hypermetabolic although difficult to separate from the main hypermetabolic mass. Incidental CT findings: Coronary, aortic arch, and branch vessel atherosclerotic vascular disease. ABDOMEN/PELVIS: No significant abnormal hypermetabolic activity in this region. Incidental CT findings: Atherosclerosis is present, including aortoiliac atherosclerotic disease. SKELETON: Hypermetabolic skeletal metastatic lesions are observed. These include the left seventh rib laterally with associated pathologic fracture (maximum SUV 10.3), the left T12 pedicle, the left L1 vertebral body, anterior L2 vertebral body, the posterior L5 vertebral body  (maximum SUV 16.5), the right posterior iliac bone, the right anterior iliac bone, and the left posterior iliac bone adjacent to the SI joint. The right anterior iliac bone lesion has a maximum SUV of 15.9 and demonstrates early cortical breakthrough laterally on image 197 series 2. These lesions are subtle and where visible, generally lucent. Incidental CT findings: Bilateral degenerative glenohumeral arthropathy. Degenerative disc disease at L5-S1. IMPRESSION: 1. 3.8 cm hypermetabolic left upper lobe mass with hypermetabolic left hilar and infrahilar adenopathy, as well as approximately 8 scattered metastatic lesions in the skeleton. 2. Mixed density photopenic lesion in the left cerebellum, characterized as hemorrhage on recent prior imaging workups. 3. Aortic Atherosclerosis (ICD10-I70.0). Coronary atherosclerosis. Degenerative disc disease at L5-S1. Chronic bilateral maxillary sinusitis. Electronically Signed   By: Van Clines M.D.   On: 07/26/2021 11:14

## 2021-08-16 NOTE — Assessment & Plan Note (Signed)
Hb trends down. Monitor counts.

## 2021-08-16 NOTE — Assessment & Plan Note (Signed)
Percocet PRN. He rarely utilize it.

## 2021-08-16 NOTE — Assessment & Plan Note (Signed)
Discussed about option of palliative radiation to metastatic site for pain control.

## 2021-08-16 NOTE — Assessment & Plan Note (Signed)
Discussed with patient and family

## 2021-08-16 NOTE — Assessment & Plan Note (Signed)
Chemotherapy plan as listed above 

## 2021-08-16 NOTE — Assessment & Plan Note (Addendum)
Labs are reviewed and discussed with patient. Proceed with carboplatin/Alimta/Keytruda today.

## 2021-08-16 NOTE — Assessment & Plan Note (Signed)
Continue Eliquis 5mg  BID

## 2021-08-16 NOTE — Patient Instructions (Addendum)
[facility] CANCER CTR AT [place]  Discharge Instructions: Thank you for choosing [facility] to provide your oncology and hematology care.  If you have a lab appointment with the [facility], please go directly to the [facility] and check in at the registration area.  Wear comfortable clothing and clothing appropriate for easy access to any Portacath or PICC line.   We strive to give you quality time with your provider. You may need to reschedule your appointment if you arrive late (15 or more minutes).  Arriving late affects you and other patients whose appointments are after yours.  Also, if you miss three or more appointments without notifying the office, you may be dismissed from the clinic at the provider's discretion.      For prescription refill requests, have your pharmacy contact our office and allow 72 hours for refills to be completed.    Today you received the following chemotherapy and/or immunotherapy agents Keytruda, Alimta, Carboplatin      To help prevent nausea and vomiting after your treatment, we encourage you to take your nausea medication as directed.  BELOW ARE SYMPTOMS THAT SHOULD BE REPORTED IMMEDIATELY: *FEVER GREATER THAN 100.4 F (38 C) OR HIGHER *CHILLS OR SWEATING *NAUSEA AND VOMITING THAT IS NOT CONTROLLED WITH YOUR NAUSEA MEDICATION *UNUSUAL SHORTNESS OF BREATH *UNUSUAL BRUISING OR BLEEDING *URINARY PROBLEMS (pain or burning when urinating, or frequent urination) *BOWEL PROBLEMS (unusual diarrhea, constipation, pain near the anus) TENDERNESS IN MOUTH AND THROAT WITH OR WITHOUT PRESENCE OF ULCERS (sore throat, sores in mouth, or a toothache) UNUSUAL RASH, SWELLING OR PAIN  UNUSUAL VAGINAL DISCHARGE OR ITCHING   Items with * indicate a potential emergency and should be followed up as soon as possible or go to the Emergency Department if any problems should occur.  Please show the CHEMOTHERAPY ALERT CARD or IMMUNOTHERAPY ALERT  CARD at check-in to the Emergency Department and triage nurse.  Should you have questions after your visit or need to cancel or reschedule your appointment, please contact [facility] CANCER [place] AT [place]  [phone] and follow the prompts.  Office hours are 8:00 a.m. to 4:30 p.m. Monday - Friday. Please note that voicemails left after 4:00 p.m. may not be returned until the following business day.  We are closed weekends and major holidays. You have access to a nurse at all times for urgent questions. Please call the main number to the clinic [phone] and follow the prompts.  For any non-urgent questions, you may also contact your provider using MyChart. We now offer e-Visits for anyone 81 and older to request care online for non-urgent symptoms. For details visit [email or web address].   Also download the MyChart app! Go to the app store, search "MyChart", open the app, select [facility], and log in with your MyChart username and password.  Masks are optional in the cancer centers. If you would like for your care team to wear a mask while they are taking care of you, please let them know. For doctor visits, patients may have with them one support person who is at least 71 years old. At this time, visitors are not allowed in the infusion area.  Pembrolizumab injection What is this medication? PEMBROLIZUMAB (pem broe liz ue mab) is a monoclonal antibody. It is used to treat certain types of cancer. This medicine may be used for other purposes; ask your health care provider or pharmacist if you have questions. COMMON BRAND NAME(S): Keytruda What should I tell my care  team before I take this medication? They need to know if you have any of these conditions: autoimmune diseases like Crohn's disease, ulcerative colitis, or lupus have had or planning to have an allogeneic stem cell transplant (uses someone else's stem cells) history of organ transplant history of chest  radiation nervous system problems like myasthenia gravis or Guillain-Barre syndrome an unusual or allergic reaction to pembrolizumab, other medicines, foods, dyes, or preservatives pregnant or trying to get pregnant breast-feeding How should I use this medication? This medicine is for infusion into a vein. It is given by a health care professional in a hospital or clinic setting. A special MedGuide will be given to you before each treatment. Be sure to read this information carefully each time. Talk to your pediatrician regarding the use of this medicine in children. While this drug may be prescribed for children as young as 6 months for selected conditions, precautions do apply. Overdosage: If you think you have taken too much of this medicine contact a poison control center or emergency room at once. NOTE: This medicine is only for you. Do not share this medicine with others. What if I miss a dose? It is important not to miss your dose. Call your doctor or health care professional if you are unable to keep an appointment. What may interact with this medication? Interactions have not been studied. This list may not describe all possible interactions. Give your health care provider a list of all the medicines, herbs, non-prescription drugs, or dietary supplements you use. Also tell them if you smoke, drink alcohol, or use illegal drugs. Some items may interact with your medicine. What should I watch for while using this medication? Your condition will be monitored carefully while you are receiving this medicine. You may need blood work done while you are taking this medicine. Do not become pregnant while taking this medicine or for 4 months after stopping it. Women should inform their doctor if they wish to become pregnant or think they might be pregnant. There is a potential for serious side effects to an unborn child. Talk to your health care professional or pharmacist for more information. Do  not breast-feed an infant while taking this medicine or for 4 months after the last dose. What side effects may I notice from receiving this medication? Side effects that you should report to your doctor or health care professional as soon as possible: allergic reactions like skin rash, itching or hives, swelling of the face, lips, or tongue bloody or black, tarry breathing problems changes in vision chest pain chills confusion constipation cough diarrhea dizziness or feeling faint or lightheaded fast or irregular heartbeat fever flushing joint pain low blood counts - this medicine may decrease the number of white blood cells, red blood cells and platelets. You may be at increased risk for infections and bleeding. muscle pain muscle weakness pain, tingling, numbness in the hands or feet persistent headache redness, blistering, peeling or loosening of the skin, including inside the mouth signs and symptoms of high blood sugar such as dizziness; dry mouth; dry skin; fruity breath; nausea; stomach pain; increased hunger or thirst; increased urination signs and symptoms of kidney injury like trouble passing urine or change in the amount of urine signs and symptoms of liver injury like dark urine, light-colored stools, loss of appetite, nausea, right upper belly pain, yellowing of the eyes or skin sweating swollen lymph nodes weight loss Side effects that usually do not require medical attention (report to your doctor  or health care professional if they continue or are bothersome): decreased appetite hair loss tiredness This list may not describe all possible side effects. Call your doctor for medical advice about side effects. You may report side effects to FDA at 1-800-FDA-1088. Where should I keep my medication? This drug is given in a hospital or clinic and will not be stored at home. NOTE: This sheet is a summary. It may not cover all possible information. If you have questions  about this medicine, talk to your doctor, pharmacist, or health care provider.  2023 Elsevier/Gold Standard (2021-01-18 00:00:00)  Pemetrexed injection What is this medication? PEMETREXED (PEM e TREX ed) is a chemotherapy drug used to treat lung cancers like non-small cell lung cancer and mesothelioma. It may also be used to treat other cancers. This medicine may be used for other purposes; ask your health care provider or pharmacist if you have questions. COMMON BRAND NAME(S): Alimta, PEMFEXY What should I tell my care team before I take this medication? They need to know if you have any of these conditions: infection (especially a virus infection such as chickenpox, cold sores, or herpes) kidney disease low blood counts, like low white cell, platelet, or red cell counts lung or breathing disease, like asthma radiation therapy an unusual or allergic reaction to pemetrexed, other medicines, foods, dyes, or preservative pregnant or trying to get pregnant breast-feeding How should I use this medication? This drug is given as an infusion into a vein. It is administered in a hospital or clinic by a specially trained health care professional. Talk to your pediatrician regarding the use of this medicine in children. Special care may be needed. Overdosage: If you think you have taken too much of this medicine contact a poison control center or emergency room at once. NOTE: This medicine is only for you. Do not share this medicine with others. What if I miss a dose? It is important not to miss your dose. Call your doctor or health care professional if you are unable to keep an appointment. What may interact with this medication? This medicine may interact with the following medications: Ibuprofen This list may not describe all possible interactions. Give your health care provider a list of all the medicines, herbs, non-prescription drugs, or dietary supplements you use. Also tell them if you  smoke, drink alcohol, or use illegal drugs. Some items may interact with your medicine. What should I watch for while using this medication? Visit your doctor for checks on your progress. This drug may make you feel generally unwell. This is not uncommon, as chemotherapy can affect healthy cells as well as cancer cells. Report any side effects. Continue your course of treatment even though you feel ill unless your doctor tells you to stop. In some cases, you may be given additional medicines to help with side effects. Follow all directions for their use. Call your doctor or health care professional for advice if you get a fever, chills or sore throat, or other symptoms of a cold or flu. Do not treat yourself. This drug decreases your body's ability to fight infections. Try to avoid being around people who are sick. This medicine may increase your risk to bruise or bleed. Call your doctor or health care professional if you notice any unusual bleeding. Be careful brushing and flossing your teeth or using a toothpick because you may get an infection or bleed more easily. If you have any dental work done, tell your dentist you are receiving this  medicine. Avoid taking products that contain aspirin, acetaminophen, ibuprofen, naproxen, or ketoprofen unless instructed by your doctor. These medicines may hide a fever. Call your doctor or health care professional if you get diarrhea or mouth sores. Do not treat yourself. To protect your kidneys, drink water or other fluids as directed while you are taking this medicine. Do not become pregnant while taking this medicine or for 6 months after stopping it. Women should inform their doctor if they wish to become pregnant or think they might be pregnant. Men should not father a child while taking this medicine and for 3 months after stopping it. This may interfere with the ability to father a child. You should talk to your doctor or health care professional if you are  concerned about your fertility. There is a potential for serious side effects to an unborn child. Talk to your health care professional or pharmacist for more information. Do not breast-feed an infant while taking this medicine or for 1 week after stopping it. What side effects may I notice from receiving this medication? Side effects that you should report to your doctor or health care professional as soon as possible: allergic reactions like skin rash, itching or hives, swelling of the face, lips, or tongue breathing problems redness, blistering, peeling or loosening of the skin, including inside the mouth signs and symptoms of bleeding such as bloody or black, tarry stools; red or dark-brown urine; spitting up blood or brown material that looks like coffee grounds; red spots on the skin; unusual bruising or bleeding from the eye, gums, or nose signs and symptoms of infection like fever or chills; cough; sore throat; pain or trouble passing urine signs and symptoms of kidney injury like trouble passing urine or change in the amount of urine signs and symptoms of liver injury like dark yellow or brown urine; general ill feeling or flu-like symptoms; light-colored stools; loss of appetite; nausea; right upper belly pain; unusually weak or tired; yellowing of the eyes or skin Side effects that usually do not require medical attention (report to your doctor or health care professional if they continue or are bothersome): constipation mouth sores nausea, vomiting unusually weak or tired This list may not describe all possible side effects. Call your doctor for medical advice about side effects. You may report side effects to FDA at 1-800-FDA-1088. Where should I keep my medication? This drug is given in a hospital or clinic and will not be stored at home. NOTE: This sheet is a summary. It may not cover all possible information. If you have questions about this medicine, talk to your doctor,  pharmacist, or health care provider.  2023 Elsevier/Gold Standard (2017-04-14 00:00:00)

## 2021-08-19 ENCOUNTER — Ambulatory Visit: Payer: PPO

## 2021-08-20 ENCOUNTER — Ambulatory Visit: Payer: PPO

## 2021-08-20 ENCOUNTER — Other Ambulatory Visit: Payer: Self-pay | Admitting: Physical Medicine and Rehabilitation

## 2021-08-21 ENCOUNTER — Ambulatory Visit: Payer: PPO

## 2021-08-21 ENCOUNTER — Encounter (HOSPITAL_COMMUNITY): Payer: Self-pay | Admitting: Emergency Medicine

## 2021-08-21 NOTE — Progress Notes (Signed)
Anesthesia Chart Review:   Case: [id number] Date/Time: 08/22/21 1100   Procedure: MRI BRAIN WITH AND WITHOUT CONTRAST  WITH ANESTHESIA   Anesthesia type: General   Pre-op diagnosis: METASTATIC CANCER TO BRAIN   Location: [facility] OR RADIOLOGY ROOM / [place] OR   Surgeons: Radiologist, Medication, MD       DISCUSSION: Pt is 71 years old with hx hemorrhagic stroke April 2023, HTN, carotid artery disease, DM, lung cancer with metastasis, PE  Pt currently on eliquis for PE   Had chest port placed 08/05/21  Pt hospitalized 4/10-4/20/23 for intraparenchymal hemorrhage of brain. Pt had been out of DM and HTN meds for months prior to this. Lung mass found on CT, pt found to have poorly differentiated non-small cell lung adenocarcinoma after bronchoscopy and EBUS. PE on CT; started on heparin and transitioned to eliquis after determined brain bleeding had stopped on CT. Hospitalization complicated by DKA vs HHNK, required insulin drip; A1c was 13.6. Discharged to inpatient rehab.    PROVIDERS: - PCP is Pickard, Cammie Mcgee, MD - Oncologist is [name], MD - Neurology care with [facility]. Last office visit 08/05/21 wit Ward Givens, NP  LABS: Labs reviewed: Acceptable for surgery. - CBC w/diff 08/16/21: H/H 10.7/32.5. WBC 10.6 - CMP 08/16/21: Na 133. Glucose 214   IMAGES: CT chest 06/13/21:  1. 4.3 x 3.6 x 3.3 cm lobular spiculated mass in the posterior left upper lobe with tethering to the lateral pleura and major fissure. Imaging features are consistent with primary bronchogenic neoplasm.  2. Metastatic left hilar lymphadenopathy. 3. Peripheral micro nodularity posterior right costophrenic sulcus, likely related to infectious/inflammatory etiology and/or aspiration. 4. Aortic Atherosclerosis    EKG 06/11/21: SR. RBBB.    CV: Echo 06/19/21:  1. Abnormal septal motion . Left ventricular ejection fraction, by estimation, is 45 to 50%. The left ventricle has mildly decreased function. The  left ventricle demonstrates global hypokinesis. There is mild left ventricular hypertrophy. Left ventricular diastolic parameters are consistent with Grade I diastolic dysfunction (impaired relaxation).  2. Right ventricular systolic function is normal. The right ventricular size is normal.  3. The mitral valve is normal in structure. No evidence of mitral valve regurgitation. No evidence of mitral stenosis.  4. The aortic valve is tricuspid. There is mild calcification of the aortic valve. There is mild thickening of the aortic valve. Aortic valve regurgitation is not visualized. Aortic valve sclerosis is present, with no evidence of aortic valve stenosis.  5. The inferior vena cava is normal in size with greater than 50% respiratory variability, suggesting right atrial pressure of 3 mmHg.   Carotid US 06/13/21:  - Right Carotid: Velocities in the right ICA are consistent with a 1-39% stenosis.  - Left Carotid: Velocities in the left ICA are consistent with a 60-79% stenosis.  - Vertebrals:  Bilateral vertebral arteries demonstrate antegrade flow. - Subclavians: Normal flow hemodynamics were seen in bilateral subclavian arteries.    Past Medical History:  Diagnosis Date   Allergy    BPH (benign prostatic hypertrophy)    Cancer ([place])    Melanoma on Neck    2008   Carotid artery occlusion    Diabetes mellitus    ED (erectile dysfunction)    Hyperlipidemia    Hypertension    Retinopathy due to secondary DM [facility])     Past Surgical History:  Procedure Laterality Date   BRONCHIAL NEEDLE ASPIRATION BIOPSY  06/17/2021   Procedure: BRONCHIAL NEEDLE ASPIRATION BIOPSIES;  Surgeon: Ina Homes  C, MD;  Location: [facility];  Service: Pulmonary;;   IR IMAGING GUIDED PORT INSERTION  08/05/2021   MELANOMA EXCISION  2008   Left side of neck   RADIOLOGY WITH ANESTHESIA N/A 04/05/2020   Procedure: MRI SPINE WITOUT CONTRAST;  Surgeon: Radiologist, Medication, MD;  Location: [facility];  Service: Radiology;   Laterality: N/A;   TONSILLECTOMY     VIDEO BRONCHOSCOPY WITH ENDOBRONCHIAL ULTRASOUND N/A 06/17/2021   Procedure: VIDEO BRONCHOSCOPY WITH ENDOBRONCHIAL ULTRASOUND;  Surgeon: Candee Furbish, MD;  Location: [facility] ENDOSCOPY;  Service: Pulmonary;  Laterality: N/A;    MEDICATIONS: No current facility-administered medications for this encounter.    acetaminophen (TYLENOL) 325 MG tablet   amLODipine (NORVASC) 10 MG tablet   apixaban (ELIQUIS) 5 MG TABS tablet   atorvastatin (LIPITOR) 40 MG tablet   butalbital-acetaminophen-caffeine (FIORICET) 50-325-40 MG tablet   dexamethasone (DECADRON) 4 MG tablet   folic acid (FOLVITE) 1 MG tablet   insulin glargine (LANTUS) 100 UNIT/ML Solostar Pen   losartan (COZAAR) 50 MG tablet   metoprolol succinate (TOPROL-XL) 25 MG 24 hr tablet   omeprazole (PRILOSEC OTC) 20 MG tablet   oxyCODONE-acetaminophen (PERCOCET) 7.5-325 MG tablet   insulin aspart (NOVOLOG FLEXPEN) 100 UNIT/ML FlexPen   loratadine (CLARITIN) 10 MG tablet   meclizine (ANTIVERT) 12.5 MG tablet   pantoprazole (PROTONIX) 40 MG tablet   potassium chloride SA (KLOR-CON M) 20 MEQ tablet   topiramate (TOPAMAX) 100 MG tablet   UNABLE TO FIND   Vitamin D, Ergocalciferol, (DRISDOL) 1.25 MG (50000 UNIT) CAPS capsule    If no changes, I anticipate pt can proceed with surgery as scheduled.   Willeen Cass, [name], FNP-BC [facility] Short Stay Surgical Center/Anesthesiology Phone: [phone] 08/21/2021 10:01 AM

## 2021-08-21 NOTE — Anesthesia Preprocedure Evaluation (Addendum)
Anesthesia Evaluation  Patient identified by MRN, date of birth, ID band Patient awake    Reviewed: Allergy & Precautions, NPO status , Patient's Chart, lab work & pertinent test results  History of Anesthesia Complications Negative for: history of anesthetic complications  Airway Mallampati: II  TM Distance: >3 FB Neck ROM: Full    Dental  (+) Dental Advisory Given   Pulmonary former smoker, PE Lung cancer   Pulmonary exam normal        Cardiovascular hypertension, Normal cardiovascular exam   Echo: EF 45-50%, global hypokinesis, mild LVH, g1dd, normal RVSF, valves unremarkable   Neuro/Psych Brain mets Hemorrhagic stroke 06/2021 Bilateral carotid stenosis    GI/Hepatic Neg liver ROS, GERD  ,  Endo/Other  diabetes, Poorly Controlled, Type 2, Insulin Dependent  Renal/GU negative Renal ROS  negative genitourinary   Musculoskeletal negative musculoskeletal ROS (+)   Abdominal   Peds  Hematology  (+) Blood dyscrasia, anemia ,   Anesthesia Other Findings   Reproductive/Obstetrics                           Anesthesia Physical Anesthesia Plan  ASA: 4  Anesthesia Plan: General   Post-op Pain Management: Minimal or no pain anticipated   Induction: Intravenous  PONV Risk Score and Plan: 2 and Ondansetron, Dexamethasone, Midazolam and Treatment may vary due to age or medical condition  Airway Management Planned: LMA  Additional Equipment: None  Intra-op Plan:   Post-operative Plan: Extubation in OR  Informed Consent: I have reviewed the patients History and Physical, chart, labs and discussed the procedure including the risks, benefits and alternatives for the proposed anesthesia with the patient or authorized representative who has indicated his/her understanding and acceptance.     Dental advisory given  Plan Discussed with:   Anesthesia Plan Comments: (See APP note by Durel Salts, FNP )       Anesthesia Quick Evaluation

## 2021-08-21 NOTE — Progress Notes (Signed)
PCP - Dr Jenna Luo Cardiologist - n/a Oncology - Dr [name] Neurology - Ward Givens, NP  Chest x-ray - 06/12/21 (1V) EKG - 06/11/21 Stress Test - n/a ECHO - 06/19/21 Cardiac Cath - n/a  ICD Pacemaker/Loop - n/a  Sleep Study -  n/a CPAP - [place]  Diabetes - Patient is a diabetic type 2 on Lantus Insulin and Novolog Insulin.  (MRI procedure)  Patient to continue all medications..      If your blood sugar is less than 70 mg/dL, you will need to treat for low blood sugar: Treat a low blood sugar (less than 70 mg/dL) with  cup of clear juice (cranberry or apple), 4 glucose tablets, OR glucose gel. Recheck blood sugar in 15 minutes after treatment (to make sure it is greater than 70 mg/dL). If your blood sugar is not greater than 70 mg/dL on recheck, call [phone] for further instructions.  Blood Thinner Instructions:  Patient is on Eliquis and is having a MRI procedure tomorrow.  Patient to continue Eliquis.    Anesthesia review: Yes  STOP now taking any Aspirin (unless otherwise instructed by your surgeon), Aleve, Naproxen, Ibuprofen, Motrin, Advil, Goody's, BC's, all herbal medications, fish oil, and all vitamins.   Coronavirus Screening Do you have any of the following symptoms:  Cough yes/no: No Fever (>100.27F)  yes/no: No Runny nose yes/no: No Sore throat yes/no: No Difficulty breathing/shortness of breath  yes/no: No  Have you traveled in the last 14 days and where? yes/no: No  Patient verbalized understanding of instructions that were given via phone.

## 2021-08-22 ENCOUNTER — Ambulatory Visit (HOSPITAL_BASED_OUTPATIENT_CLINIC_OR_DEPARTMENT_OTHER): Payer: PPO | Admitting: Emergency Medicine

## 2021-08-22 ENCOUNTER — Ambulatory Visit: Payer: PPO

## 2021-08-22 ENCOUNTER — Ambulatory Visit (HOSPITAL_COMMUNITY)
Admission: RE | Admit: 2021-08-22 | Discharge: 2021-08-22 | Disposition: A | Discharge status: Home / Self Care | Payer: PPO | Attending: Radiation Oncology | Admitting: Radiation Oncology

## 2021-08-22 ENCOUNTER — Ambulatory Visit (HOSPITAL_COMMUNITY): Payer: PPO | Admitting: Emergency Medicine

## 2021-08-22 ENCOUNTER — Encounter (HOSPITAL_COMMUNITY)
Admission: RE | Disposition: A | Discharge status: Home / Self Care | Payer: Self-pay | Source: Home / Self Care | Attending: Radiation Oncology

## 2021-08-22 ENCOUNTER — Other Ambulatory Visit: Payer: Self-pay | Admitting: *Deleted

## 2021-08-22 ENCOUNTER — Ambulatory Visit (HOSPITAL_COMMUNITY)
Admission: RE | Admit: 2021-08-22 | Discharge: 2021-08-22 | Disposition: A | Discharge status: Home / Self Care | Payer: PPO | Source: Ambulatory Visit | Attending: Radiation Oncology | Admitting: Radiation Oncology

## 2021-08-22 ENCOUNTER — Other Ambulatory Visit: Payer: PPO

## 2021-08-22 ENCOUNTER — Encounter (HOSPITAL_COMMUNITY): Payer: Self-pay

## 2021-08-22 ENCOUNTER — Other Ambulatory Visit: Payer: Self-pay

## 2021-08-22 DIAGNOSIS — Z87891 Personal history of nicotine dependence: Secondary | ICD-10-CM | POA: Insufficient documentation

## 2021-08-22 DIAGNOSIS — C7931 Secondary malignant neoplasm of brain: Secondary | ICD-10-CM | POA: Insufficient documentation

## 2021-08-22 DIAGNOSIS — Z8673 Personal history of transient ischemic attack (TIA), and cerebral infarction without residual deficits: Secondary | ICD-10-CM | POA: Diagnosis not present

## 2021-08-22 DIAGNOSIS — I2510 Atherosclerotic heart disease of native coronary artery without angina pectoris: Secondary | ICD-10-CM | POA: Insufficient documentation

## 2021-08-22 DIAGNOSIS — D649 Anemia, unspecified: Secondary | ICD-10-CM | POA: Diagnosis not present

## 2021-08-22 DIAGNOSIS — Z95828 Presence of other vascular implants and grafts: Secondary | ICD-10-CM | POA: Insufficient documentation

## 2021-08-22 DIAGNOSIS — Z86711 Personal history of pulmonary embolism: Secondary | ICD-10-CM | POA: Diagnosis not present

## 2021-08-22 DIAGNOSIS — E1165 Type 2 diabetes mellitus with hyperglycemia: Secondary | ICD-10-CM | POA: Insufficient documentation

## 2021-08-22 DIAGNOSIS — C719 Malignant neoplasm of brain, unspecified: Secondary | ICD-10-CM | POA: Diagnosis not present

## 2021-08-22 DIAGNOSIS — I10 Essential (primary) hypertension: Secondary | ICD-10-CM | POA: Diagnosis not present

## 2021-08-22 DIAGNOSIS — C3490 Malignant neoplasm of unspecified part of unspecified bronchus or lung: Secondary | ICD-10-CM

## 2021-08-22 DIAGNOSIS — D759 Disease of blood and blood-forming organs, unspecified: Secondary | ICD-10-CM | POA: Diagnosis not present

## 2021-08-22 DIAGNOSIS — Z7901 Long term (current) use of anticoagulants: Secondary | ICD-10-CM | POA: Insufficient documentation

## 2021-08-22 DIAGNOSIS — D630 Anemia in neoplastic disease: Secondary | ICD-10-CM | POA: Diagnosis not present

## 2021-08-22 DIAGNOSIS — Z794 Long term (current) use of insulin: Secondary | ICD-10-CM | POA: Insufficient documentation

## 2021-08-22 DIAGNOSIS — E119 Type 2 diabetes mellitus without complications: Secondary | ICD-10-CM

## 2021-08-22 DIAGNOSIS — G9389 Other specified disorders of brain: Secondary | ICD-10-CM | POA: Diagnosis not present

## 2021-08-22 DIAGNOSIS — K219 Gastro-esophageal reflux disease without esophagitis: Secondary | ICD-10-CM | POA: Insufficient documentation

## 2021-08-22 DIAGNOSIS — Z01818 Encounter for other preprocedural examination: Secondary | ICD-10-CM

## 2021-08-22 HISTORY — PX: RADIOLOGY WITH ANESTHESIA: SHX6223

## 2021-08-22 HISTORY — DX: Malignant neoplasm of unspecified part of unspecified bronchus or lung: C34.90

## 2021-08-22 HISTORY — DX: Gastro-esophageal reflux disease without esophagitis: K21.9

## 2021-08-22 LAB — GLUCOSE, CAPILLARY
Glucose-Capillary: 133 mg/dL — ABNORMAL HIGH (ref 70–99)
Glucose-Capillary: 144 mg/dL — ABNORMAL HIGH (ref 70–99)

## 2021-08-22 SURGERY — MRI WITH ANESTHESIA
Anesthesia: General

## 2021-08-22 MED ORDER — ORAL CARE MOUTH RINSE: 15.0000 mL | Freq: Once | OROMUCOSAL | Status: AC

## 2021-08-22 MED ORDER — LACTATED RINGERS IV SOLN: INTRAVENOUS | Status: DC

## 2021-08-22 MED ORDER — FENTANYL CITRATE (PF) 250 MCG/5ML IJ SOLN
INTRAMUSCULAR | Status: DC | PRN
  Administered 2021-08-22: 50 ug via INTRAVENOUS

## 2021-08-22 MED ORDER — LIDOCAINE 2% (20 MG/ML) 5 ML SYRINGE
INTRAMUSCULAR | Status: DC | PRN
  Administered 2021-08-22: 100 mg via INTRAVENOUS

## 2021-08-22 MED ORDER — CHLORHEXIDINE GLUCONATE 0.12 % MT SOLN
OROMUCOSAL | Status: AC
  Administered 2021-08-22: 15 mL via OROMUCOSAL
  Filled 2021-08-22: qty 15

## 2021-08-22 MED ORDER — ORAL CARE MOUTH RINSE: 15.0000 mL | Freq: Once | OROMUCOSAL | Status: DC

## 2021-08-22 MED ORDER — ROCURONIUM BROMIDE 10 MG/ML (PF) SYRINGE
PREFILLED_SYRINGE | INTRAVENOUS | Status: DC | PRN
  Administered 2021-08-22: 60 mg via INTRAVENOUS

## 2021-08-22 MED ORDER — CHLORHEXIDINE GLUCONATE 0.12 % MT SOLN: 15.0000 mL | Freq: Once | OROMUCOSAL | Status: AC

## 2021-08-22 MED ORDER — CHLORHEXIDINE GLUCONATE 0.12 % MT SOLN: 15.0000 mL | Freq: Once | OROMUCOSAL | Status: DC

## 2021-08-22 MED ORDER — PROPOFOL 10 MG/ML IV BOLUS
INTRAVENOUS | Status: DC | PRN
  Administered 2021-08-22: 100 mg via INTRAVENOUS

## 2021-08-22 MED ORDER — OXYCODONE-ACETAMINOPHEN 7.5-325 MG PO TABS: 1.0000 | ORAL_TABLET | ORAL | 0 refills | Status: DC | PRN

## 2021-08-22 MED ORDER — PHENYLEPHRINE 80 MCG/ML (10ML) SYRINGE FOR IV PUSH (FOR BLOOD PRESSURE SUPPORT)
PREFILLED_SYRINGE | INTRAVENOUS | Status: DC | PRN
  Administered 2021-08-22: 160 ug via INTRAVENOUS

## 2021-08-22 MED ORDER — GADOBUTROL 1 MMOL/ML IV SOLN
6.0000 mL | Freq: Once | INTRAVENOUS | Status: AC | PRN
  Administered 2021-08-22: 6 mL via INTRAVENOUS

## 2021-08-22 MED ORDER — DEXAMETHASONE SODIUM PHOSPHATE 10 MG/ML IJ SOLN
INTRAMUSCULAR | Status: DC | PRN
  Administered 2021-08-22: 10 mg via INTRAVENOUS

## 2021-08-22 MED ORDER — SUGAMMADEX SODIUM 200 MG/2ML IV SOLN
INTRAVENOUS | Status: DC | PRN
  Administered 2021-08-22: 200 mg via INTRAVENOUS

## 2021-08-22 MED ORDER — ONDANSETRON HCL 4 MG/2ML IJ SOLN
INTRAMUSCULAR | Status: DC | PRN
  Administered 2021-08-22: 4 mg via INTRAVENOUS

## 2021-08-22 NOTE — Anesthesia Procedure Notes (Signed)
Procedure Name: Intubation Date/Time: 08/22/2021 10:43 AM  Performed by: Lorie Phenix, CRNAPre-anesthesia Checklist: Patient identified, Emergency Drugs available, Suction available and Patient being monitored Patient Re-evaluated:Patient Re-evaluated prior to induction Oxygen Delivery Method: Circle System Utilized Preoxygenation: Pre-oxygenation with 100% oxygen Induction Type: IV induction Ventilation: Mask ventilation without difficulty Laryngoscope Size: Mac and 4 Grade View: Grade I Tube type: Oral Tube size: 7.5 mm Number of attempts: 1 Airway Equipment and Method: Stylet Placement Confirmation: ETT inserted through vocal cords under direct vision, positive ETCO2 and breath sounds checked- equal and bilateral Secured at: 23 cm Tube secured with: Tape Dental Injury: Teeth and Oropharynx as per pre-operative assessment

## 2021-08-22 NOTE — Transfer of Care (Signed)
Immediate Anesthesia Transfer of Care Note  Patient: Micheal Donovan  Procedure(s) Performed: MRI BRAIN WITH AND WITHOUT CONTRAST  WITH ANESTHESIA  Patient Location: PACU  Anesthesia Type:General  Level of Consciousness: awake and alert   Airway & Oxygen Therapy: Patient Spontanous Breathing  Post-op Assessment: Report given to RN and Post -op Vital signs reviewed and stable  Post vital signs: Reviewed and stable  Last Vitals:  Vitals Value Taken Time  BP 142/85 08/22/21 1148  Temp    Pulse 84 08/22/21 1149  Resp 16 08/22/21 1149  SpO2 99 % 08/22/21 1149  Vitals shown include unvalidated device data.  Last Pain:  Vitals:   08/22/21 0920  PainSc: 0-No pain         Complications: No notable events documented.

## 2021-08-22 NOTE — Anesthesia Postprocedure Evaluation (Signed)
Anesthesia Post Note  Patient: LEANDER TOUT  Procedure(s) Performed: MRI BRAIN WITH AND WITHOUT CONTRAST  WITH ANESTHESIA     Patient location during evaluation: PACU Anesthesia Type: General Level of consciousness: awake and alert Pain management: pain level controlled Vital Signs Assessment: post-procedure vital signs reviewed and stable Respiratory status: spontaneous breathing, nonlabored ventilation and respiratory function stable Cardiovascular status: blood pressure returned to baseline and stable Postop Assessment: no apparent nausea or vomiting Anesthetic complications: no   No notable events documented.  Last Vitals:  Vitals:   08/22/21 1149 08/22/21 1150  BP:  (!) 142/85  Pulse:  86  Resp:  17  Temp: 36.4 C   SpO2: 98% 98%    Last Pain:  Vitals:   08/22/21 1149  PainSc: 0-No pain                 Lidia Collum

## 2021-08-23 ENCOUNTER — Inpatient Hospital Stay: Payer: PPO | Attending: Nurse Practitioner

## 2021-08-23 ENCOUNTER — Inpatient Hospital Stay: Payer: PPO

## 2021-08-23 ENCOUNTER — Inpatient Hospital Stay: Payer: PPO | Admitting: Nurse Practitioner

## 2021-08-23 ENCOUNTER — Ambulatory Visit: Payer: PPO

## 2021-08-23 ENCOUNTER — Encounter (HOSPITAL_COMMUNITY): Payer: Self-pay | Admitting: Radiology

## 2021-08-23 ENCOUNTER — Telehealth: Payer: Self-pay

## 2021-08-23 VITALS — BP 127/72 | HR 78 | Temp 97.9°F | Resp 18 | Wt 140.0 lb

## 2021-08-23 DIAGNOSIS — E871 Hypo-osmolality and hyponatremia: Secondary | ICD-10-CM

## 2021-08-23 DIAGNOSIS — C7951 Secondary malignant neoplasm of bone: Secondary | ICD-10-CM

## 2021-08-23 DIAGNOSIS — C3412 Malignant neoplasm of upper lobe, left bronchus or lung: Secondary | ICD-10-CM

## 2021-08-23 DIAGNOSIS — Z5111 Encounter for antineoplastic chemotherapy: Secondary | ICD-10-CM | POA: Diagnosis not present

## 2021-08-23 LAB — CBC WITH DIFFERENTIAL/PLATELET
Abs Immature Granulocytes: 0.03 10*3/uL (ref 0.00–0.07)
Basophils Absolute: 0 10*3/uL (ref 0.0–0.1)
Basophils Relative: 0 %
Eosinophils Absolute: 0.2 10*3/uL (ref 0.0–0.5)
Eosinophils Relative: 3 %
HCT: 32.3 % — ABNORMAL LOW (ref 39.0–52.0)
Hemoglobin: 10.6 g/dL — ABNORMAL LOW (ref 13.0–17.0)
Immature Granulocytes: 1 %
Lymphocytes Relative: 46 %
Lymphs Abs: 2.2 10*3/uL (ref 0.7–4.0)
MCH: 29.9 pg (ref 26.0–34.0)
MCHC: 32.8 g/dL (ref 30.0–36.0)
MCV: 91 fL (ref 80.0–100.0)
Monocytes Absolute: 0.3 10*3/uL (ref 0.1–1.0)
Monocytes Relative: 6 %
Neutro Abs: 2.2 10*3/uL (ref 1.7–7.7)
Neutrophils Relative %: 44 %
Platelets: 260 10*3/uL (ref 150–400)
RBC: 3.55 MIL/uL — ABNORMAL LOW (ref 4.22–5.81)
RDW: 14.7 % (ref 11.5–15.5)
WBC: 4.9 10*3/uL (ref 4.0–10.5)
nRBC: 0 % (ref 0.0–0.2)

## 2021-08-23 LAB — COMPREHENSIVE METABOLIC PANEL
ALT: 10 U/L (ref 0–44)
AST: 14 U/L — ABNORMAL LOW (ref 15–41)
Albumin: 3.5 g/dL (ref 3.5–5.0)
Alkaline Phosphatase: 114 U/L (ref 38–126)
Anion gap: 9 (ref 5–15)
BUN: 23 mg/dL (ref 8–23)
CO2: 23 mmol/L (ref 22–32)
Calcium: 9 mg/dL (ref 8.9–10.3)
Chloride: 97 mmol/L — ABNORMAL LOW (ref 98–111)
Creatinine, Ser: 0.66 mg/dL (ref 0.61–1.24)
GFR, Estimated: >60 mL/min (ref >60)
Glucose, Bld: 183 mg/dL — ABNORMAL HIGH (ref 70–99)
Potassium: 4.2 mmol/L (ref 3.5–5.1)
Sodium: 129 mmol/L — ABNORMAL LOW (ref 135–145)
Total Bilirubin: 0.4 mg/dL (ref 0.3–1.2)
Total Protein: 7 g/dL (ref 6.5–8.1)

## 2021-08-23 LAB — PSA: Prostatic Specific Antigen: 1.3 ng/mL (ref 0.00–4.00)

## 2021-08-23 MED ORDER — METOPROLOL SUCCINATE ER 25 MG PO TB24: 12.5000 mg | ORAL_TABLET | Freq: Every day | ORAL | 0 refills | Status: DC

## 2021-08-23 NOTE — Progress Notes (Signed)
Nutrition Follow-up:  Patient with non small cell lung cancer.  Patient receiving radiation and chemotherapy.  Met briefly with patient today after NP visit. Patient ready to go home as not getting fluids today.  Says that his appetite is good.  He is eating 2 meals per day.  Denies any nutrition impact symptoms    Medications: reviewed  Labs: reviewed  Anthropometrics:   Weight 140 lb,  137 lb on 6/6 134 lb on 6/1 143 lb on 4/20 175 lb on 04/05/20   NUTRITION DIAGNOSIS: Inadequate oral intake improving   INTERVENTION:  Continue to encourage oral intake to prevent weight loss    MONITORING, EVALUATION, GOAL: weight trends, intake   NEXT VISIT: as needed with treatment  Micheal Donovan B. Freida Busman, RD, LDN Registered Dietitian [phone]

## 2021-08-23 NOTE — Telephone Encounter (Signed)
Refill request Metoprolol ER 25 mg. One time courtesy refill.

## 2021-08-26 ENCOUNTER — Inpatient Hospital Stay: Payer: PPO

## 2021-08-26 ENCOUNTER — Other Ambulatory Visit: Payer: Self-pay | Admitting: Oncology

## 2021-08-26 ENCOUNTER — Telehealth: Payer: Self-pay | Admitting: *Deleted

## 2021-08-26 ENCOUNTER — Ambulatory Visit: Payer: PPO

## 2021-08-26 MED ORDER — TRAZODONE HCL 50 MG PO TABS: 50.0000 mg | ORAL_TABLET | Freq: Every evening | ORAL | 1 refills | Status: DC | PRN

## 2021-08-26 NOTE — Telephone Encounter (Signed)
Pt's daughter, French Ana, made aware of MD recommendations.

## 2021-08-27 ENCOUNTER — Other Ambulatory Visit: Payer: Self-pay | Admitting: Family Medicine

## 2021-08-27 ENCOUNTER — Ambulatory Visit: Payer: PPO

## 2021-08-27 ENCOUNTER — Telehealth: Payer: Self-pay

## 2021-08-27 MED ORDER — SANTYL 250 UNIT/GM EX OINT: 1.0000 | TOPICAL_OINTMENT | Freq: Every day | CUTANEOUS | 0 refills | Status: DC

## 2021-08-27 NOTE — Telephone Encounter (Signed)
Micheal Donovan w/[facility] called stated that pt's sacrum wound is not healing. Its acturally getting worst. Would like to request for you to send in [name] to pt, so that it can be use for pt's wound.   Please advice

## 2021-08-27 NOTE — Telephone Encounter (Signed)
Nedra Hai Nurse w/[facility] [phone] Report :  Pt had a fall 09/21/21, per daughter, pt lost balance, hit his bottom so bottom I sore.

## 2021-08-28 ENCOUNTER — Ambulatory Visit: Payer: PPO

## 2021-08-28 DIAGNOSIS — L89152 Pressure ulcer of sacral region, stage 2: Secondary | ICD-10-CM | POA: Diagnosis not present

## 2021-08-28 NOTE — Telephone Encounter (Signed)
Called left msg w/Elizabeth w/[facility] regards the med-Santyle  Also called pt is aware of the meds, per pt will have daughter go pick it up from pharmacy.

## 2021-08-29 ENCOUNTER — Ambulatory Visit: Payer: PPO

## 2021-08-30 ENCOUNTER — Other Ambulatory Visit: Payer: PPO

## 2021-08-30 ENCOUNTER — Ambulatory Visit: Payer: PPO | Admitting: Oncology

## 2021-08-30 ENCOUNTER — Inpatient Hospital Stay: Payer: PPO

## 2021-08-30 ENCOUNTER — Ambulatory Visit: Payer: PPO

## 2021-08-30 ENCOUNTER — Inpatient Hospital Stay: Payer: PPO | Admitting: Nurse Practitioner

## 2021-09-02 ENCOUNTER — Ambulatory Visit: Payer: PPO

## 2021-09-04 ENCOUNTER — Ambulatory Visit: Payer: PPO

## 2021-09-05 ENCOUNTER — Ambulatory Visit: Payer: PPO

## 2021-09-05 DIAGNOSIS — Z794 Long term (current) use of insulin: Secondary | ICD-10-CM | POA: Diagnosis not present

## 2021-09-05 DIAGNOSIS — E1165 Type 2 diabetes mellitus with hyperglycemia: Secondary | ICD-10-CM | POA: Diagnosis not present

## 2021-09-05 DIAGNOSIS — L89150 Pressure ulcer of sacral region, unstageable: Secondary | ICD-10-CM | POA: Diagnosis not present

## 2021-09-05 DIAGNOSIS — I6529 Occlusion and stenosis of unspecified carotid artery: Secondary | ICD-10-CM | POA: Diagnosis not present

## 2021-09-05 DIAGNOSIS — E785 Hyperlipidemia, unspecified: Secondary | ICD-10-CM | POA: Diagnosis not present

## 2021-09-05 DIAGNOSIS — I2699 Other pulmonary embolism without acute cor pulmonale: Secondary | ICD-10-CM | POA: Diagnosis not present

## 2021-09-05 DIAGNOSIS — C3412 Malignant neoplasm of upper lobe, left bronchus or lung: Secondary | ICD-10-CM | POA: Diagnosis not present

## 2021-09-05 DIAGNOSIS — R918 Other nonspecific abnormal finding of lung field: Secondary | ICD-10-CM | POA: Diagnosis not present

## 2021-09-05 DIAGNOSIS — I614 Nontraumatic intracerebral hemorrhage in cerebellum: Secondary | ICD-10-CM | POA: Diagnosis not present

## 2021-09-05 DIAGNOSIS — Z7901 Long term (current) use of anticoagulants: Secondary | ICD-10-CM | POA: Diagnosis not present

## 2021-09-06 ENCOUNTER — Encounter: Payer: Self-pay | Admitting: Oncology

## 2021-09-06 ENCOUNTER — Inpatient Hospital Stay: Payer: PPO

## 2021-09-06 ENCOUNTER — Inpatient Hospital Stay: Payer: PPO | Attending: Radiation Oncology | Admitting: Hospice and Palliative Medicine

## 2021-09-06 ENCOUNTER — Other Ambulatory Visit: Payer: PPO

## 2021-09-06 ENCOUNTER — Ambulatory Visit: Payer: PPO | Admitting: Nurse Practitioner

## 2021-09-06 ENCOUNTER — Encounter: Payer: Self-pay | Admitting: *Deleted

## 2021-09-06 ENCOUNTER — Inpatient Hospital Stay (HOSPITAL_BASED_OUTPATIENT_CLINIC_OR_DEPARTMENT_OTHER): Payer: PPO | Admitting: Oncology

## 2021-09-06 ENCOUNTER — Ambulatory Visit: Payer: PPO

## 2021-09-06 DIAGNOSIS — Z87891 Personal history of nicotine dependence: Secondary | ICD-10-CM | POA: Insufficient documentation

## 2021-09-06 DIAGNOSIS — Z794 Long term (current) use of insulin: Secondary | ICD-10-CM | POA: Insufficient documentation

## 2021-09-06 DIAGNOSIS — Z5112 Encounter for antineoplastic immunotherapy: Secondary | ICD-10-CM | POA: Insufficient documentation

## 2021-09-06 DIAGNOSIS — D519 Vitamin B12 deficiency anemia, unspecified: Secondary | ICD-10-CM | POA: Diagnosis not present

## 2021-09-06 DIAGNOSIS — D6481 Anemia due to antineoplastic chemotherapy: Secondary | ICD-10-CM

## 2021-09-06 DIAGNOSIS — Z923 Personal history of irradiation: Secondary | ICD-10-CM | POA: Insufficient documentation

## 2021-09-06 DIAGNOSIS — F32A Depression, unspecified: Secondary | ICD-10-CM | POA: Diagnosis not present

## 2021-09-06 DIAGNOSIS — C3412 Malignant neoplasm of upper lobe, left bronchus or lung: Secondary | ICD-10-CM

## 2021-09-06 DIAGNOSIS — C7931 Secondary malignant neoplasm of brain: Secondary | ICD-10-CM | POA: Insufficient documentation

## 2021-09-06 DIAGNOSIS — D75839 Thrombocytosis, unspecified: Secondary | ICD-10-CM

## 2021-09-06 DIAGNOSIS — N400 Enlarged prostate without lower urinary tract symptoms: Secondary | ICD-10-CM | POA: Insufficient documentation

## 2021-09-06 DIAGNOSIS — Z79899 Other long term (current) drug therapy: Secondary | ICD-10-CM | POA: Insufficient documentation

## 2021-09-06 DIAGNOSIS — K219 Gastro-esophageal reflux disease without esophagitis: Secondary | ICD-10-CM | POA: Insufficient documentation

## 2021-09-06 DIAGNOSIS — Z85820 Personal history of malignant melanoma of skin: Secondary | ICD-10-CM | POA: Diagnosis not present

## 2021-09-06 DIAGNOSIS — I619 Nontraumatic intracerebral hemorrhage, unspecified: Secondary | ICD-10-CM | POA: Diagnosis not present

## 2021-09-06 DIAGNOSIS — T451X5A Adverse effect of antineoplastic and immunosuppressive drugs, initial encounter: Secondary | ICD-10-CM

## 2021-09-06 DIAGNOSIS — I700 Atherosclerosis of aorta: Secondary | ICD-10-CM | POA: Insufficient documentation

## 2021-09-06 DIAGNOSIS — G893 Neoplasm related pain (acute) (chronic): Secondary | ICD-10-CM | POA: Insufficient documentation

## 2021-09-06 DIAGNOSIS — C7951 Secondary malignant neoplasm of bone: Secondary | ICD-10-CM | POA: Insufficient documentation

## 2021-09-06 DIAGNOSIS — Z8673 Personal history of transient ischemic attack (TIA), and cerebral infarction without residual deficits: Secondary | ICD-10-CM | POA: Insufficient documentation

## 2021-09-06 DIAGNOSIS — Z5111 Encounter for antineoplastic chemotherapy: Secondary | ICD-10-CM | POA: Insufficient documentation

## 2021-09-06 DIAGNOSIS — Z86711 Personal history of pulmonary embolism: Secondary | ICD-10-CM | POA: Diagnosis not present

## 2021-09-06 DIAGNOSIS — E785 Hyperlipidemia, unspecified: Secondary | ICD-10-CM | POA: Insufficient documentation

## 2021-09-06 DIAGNOSIS — I10 Essential (primary) hypertension: Secondary | ICD-10-CM | POA: Diagnosis not present

## 2021-09-06 DIAGNOSIS — E11319 Type 2 diabetes mellitus with unspecified diabetic retinopathy without macular edema: Secondary | ICD-10-CM | POA: Diagnosis not present

## 2021-09-06 DIAGNOSIS — Z515 Encounter for palliative care: Secondary | ICD-10-CM

## 2021-09-06 DIAGNOSIS — I2699 Other pulmonary embolism without acute cor pulmonale: Secondary | ICD-10-CM | POA: Diagnosis not present

## 2021-09-06 DIAGNOSIS — R630 Anorexia: Secondary | ICD-10-CM | POA: Insufficient documentation

## 2021-09-06 DIAGNOSIS — Z7901 Long term (current) use of anticoagulants: Secondary | ICD-10-CM | POA: Insufficient documentation

## 2021-09-06 LAB — CBC WITH DIFFERENTIAL/PLATELET
Abs Immature Granulocytes: 0.07 10*3/uL (ref 0.00–0.07)
Basophils Absolute: 0 10*3/uL (ref 0.0–0.1)
Basophils Relative: 0 %
Eosinophils Absolute: 0 10*3/uL (ref 0.0–0.5)
Eosinophils Relative: 0 %
HCT: 29 % — ABNORMAL LOW (ref 39.0–52.0)
Hemoglobin: 9.5 g/dL — ABNORMAL LOW (ref 13.0–17.0)
Immature Granulocytes: 1 %
Lymphocytes Relative: 22 %
Lymphs Abs: 1.9 10*3/uL (ref 0.7–4.0)
MCH: 30.6 pg (ref 26.0–34.0)
MCHC: 32.8 g/dL (ref 30.0–36.0)
MCV: 93.5 fL (ref 80.0–100.0)
Monocytes Absolute: 0.5 10*3/uL (ref 0.1–1.0)
Monocytes Relative: 6 %
Neutro Abs: 5.9 10*3/uL (ref 1.7–7.7)
Neutrophils Relative %: 71 %
Platelets: 664 10*3/uL — ABNORMAL HIGH (ref 150–400)
RBC: 3.1 MIL/uL — ABNORMAL LOW (ref 4.22–5.81)
RDW: 16 % — ABNORMAL HIGH (ref 11.5–15.5)
WBC: 8.4 10*3/uL (ref 4.0–10.5)
nRBC: 0 % (ref 0.0–0.2)

## 2021-09-06 LAB — COMPREHENSIVE METABOLIC PANEL
ALT: 12 U/L (ref 0–44)
AST: 12 U/L — ABNORMAL LOW (ref 15–41)
Albumin: 3.5 g/dL (ref 3.5–5.0)
Alkaline Phosphatase: 127 U/L — ABNORMAL HIGH (ref 38–126)
Anion gap: 6 (ref 5–15)
BUN: 21 mg/dL (ref 8–23)
CO2: 24 mmol/L (ref 22–32)
Calcium: 8.9 mg/dL (ref 8.9–10.3)
Chloride: 100 mmol/L (ref 98–111)
Creatinine, Ser: 0.6 mg/dL — ABNORMAL LOW (ref 0.61–1.24)
GFR, Estimated: >60 mL/min (ref >60)
Glucose, Bld: 152 mg/dL — ABNORMAL HIGH (ref 70–99)
Potassium: 4.2 mmol/L (ref 3.5–5.1)
Sodium: 130 mmol/L — ABNORMAL LOW (ref 135–145)
Total Bilirubin: 0.4 mg/dL (ref 0.3–1.2)
Total Protein: 7 g/dL (ref 6.5–8.1)

## 2021-09-06 MED ORDER — SODIUM CHLORIDE 0.9 % IV SOLN
Freq: Once | INTRAVENOUS | Status: AC
  Filled 2021-09-06: qty 250

## 2021-09-06 MED ORDER — SODIUM CHLORIDE 0.9 % IV SOLN
200.0000 mg | Freq: Once | INTRAVENOUS | Status: AC
  Administered 2021-09-06: 200 mg via INTRAVENOUS
  Filled 2021-09-06: qty 8

## 2021-09-06 MED ORDER — SODIUM CHLORIDE 0.9 % IV SOLN
10.0000 mg | Freq: Once | INTRAVENOUS | Status: AC
  Administered 2021-09-06: 10 mg via INTRAVENOUS
  Filled 2021-09-06: qty 1

## 2021-09-06 MED ORDER — APIXABAN 2.5 MG PO TABS: 2.5000 mg | ORAL_TABLET | Freq: Two times a day (BID) | ORAL | 3 refills | Status: DC

## 2021-09-06 MED ORDER — PALONOSETRON HCL INJECTION 0.25 MG/5ML
0.2500 mg | Freq: Once | INTRAVENOUS | Status: AC
  Administered 2021-09-06: 0.25 mg via INTRAVENOUS
  Filled 2021-09-06: qty 5

## 2021-09-06 MED ORDER — HEPARIN SOD (PORK) LOCK FLUSH 100 UNIT/ML IV SOLN
500.0000 [IU] | Freq: Once | INTRAVENOUS | Status: AC | PRN
  Administered 2021-09-06: 500 [IU]
  Filled 2021-09-06: qty 5

## 2021-09-06 MED ORDER — SODIUM CHLORIDE 0.9 % IV SOLN
385.2000 mg | Freq: Once | INTRAVENOUS | Status: AC
  Administered 2021-09-06: 390 mg via INTRAVENOUS
  Filled 2021-09-06: qty 39

## 2021-09-06 MED ORDER — SODIUM CHLORIDE 0.9 % IV SOLN
150.0000 mg | Freq: Once | INTRAVENOUS | Status: AC
  Administered 2021-09-06: 150 mg via INTRAVENOUS
  Filled 2021-09-06: qty 5

## 2021-09-06 MED ORDER — SODIUM CHLORIDE 0.9 % IV SOLN
400.0000 mg/m2 | Freq: Once | INTRAVENOUS | Status: AC
  Administered 2021-09-06: 700 mg via INTRAVENOUS
  Filled 2021-09-06: qty 20

## 2021-09-06 NOTE — Progress Notes (Signed)
Patient tolerated chemo infusion well, no questions/concerns voiced. Patient stable at discharge. AVS given.

## 2021-09-06 NOTE — Assessment & Plan Note (Signed)
Chemotherapy plan as listed above 

## 2021-09-06 NOTE — Progress Notes (Signed)
[place] at [facility] [phone] [phone]) [phone]   Name: Micheal Donovan Date: 09/06/2021 MRN: [id number]  DOB: 1950-10-10  Patient Care Team: Default, Provider, MD as PCP - General Micheal Nab, RN as Oncology Nurse Navigator    REASON FOR CONSULTATION: Micheal Donovan is a 71 y.o. male with multiple medical problems including non-small cell lung cancer, history of subacute intraparenchymal hemorrhage, PE.  Patient is on systemic chemotherapy and XRT.  He has had decreased appetite and depression and was referred to palliative care to help address goals and manage ongoing symptoms.  SOCIAL HISTORY:     reports that he quit smoking about 31 years ago. His smoking use included cigarettes. He has never used smokeless tobacco. He reports that he does not drink alcohol and does not use drugs.  Patient is married.  He has a daughter who is his primary caregiver.  Patient previously worked as an Customer service manager  ADVANCE DIRECTIVES:  On file  CODE STATUS:   PAST MEDICAL HISTORY: Past Medical History:  Diagnosis Date   Allergy    BPH (benign prostatic hypertrophy)    Cancer ([place])    Melanoma on Neck    2008   Carotid artery occlusion    Diabetes mellitus    type 2   ED (erectile dysfunction)    GERD (gastroesophageal reflux disease)    Hemorrhagic stroke ([place]) 06/2021   Hyperlipidemia    Hypertension    Lung cancer ([place])    Retinopathy due to secondary DM ([place])     PAST SURGICAL HISTORY:  Past Surgical History:  Procedure Laterality Date   BRONCHIAL NEEDLE ASPIRATION BIOPSY  06/17/2021   Procedure: BRONCHIAL NEEDLE ASPIRATION BIOPSIES;  Surgeon: Candee Furbish, MD;  Location: [place];  Service: Pulmonary;;   IR IMAGING GUIDED PORT INSERTION  08/05/2021   MELANOMA EXCISION  2008   Left side of neck   RADIOLOGY WITH ANESTHESIA N/A 04/05/2020   Procedure: MRI SPINE WITOUT CONTRAST;  Surgeon: Radiologist,  Medication, MD;  Location: [place];  Service: Radiology;  Laterality: N/A;   RADIOLOGY WITH ANESTHESIA N/A 08/22/2021   Procedure: MRI BRAIN WITH AND WITHOUT CONTRAST  WITH ANESTHESIA;  Surgeon: Radiologist, Medication, MD;  Location: [place];  Service: Radiology;  Laterality: N/A;   TONSILLECTOMY     VIDEO BRONCHOSCOPY WITH ENDOBRONCHIAL ULTRASOUND N/A 06/17/2021   Procedure: VIDEO BRONCHOSCOPY WITH ENDOBRONCHIAL ULTRASOUND;  Surgeon: Candee Furbish, MD;  Location: [facility] ENDOSCOPY;  Service: Pulmonary;  Laterality: N/A;    HEMATOLOGY/ONCOLOGY HISTORY:  Oncology History Overview Note  Diagnosis: Stage IIB T2b N1 M0 adenocarcinoma of the LUL, poorly differentiated     Primary non-small cell carcinoma of upper lobe of left lung ([place])  06/13/2021 Initial Diagnosis   Primary non-small cell carcinoma of upper lobe of left lung [facility]) -06/20/2021 - 06/27/2021, patient presented to [facility] due to progressive headache/dizziness/gait changes. CT head showed acute to subacute intraparenchymal hemorrhage involving the left cerebellum.  Surrounding low-density vasogenic edema.  Patient was transferred to [facility]. This was further evaluated by CT angiogram of the neck which showed bulky calcified plaques/stenosis of carotid artery, Followed by MRI brain. 06/12/2021, MRI of the brain showed no significant interval change in size of the left cerebellar intraparenchymal hematoma with unchanged regional mass effect and partial effacement of fourth ventricle but no upstream hydrocephalus. There is no discernible enhancement to suggest underlying mass lesion, though acute blood products could mask enhancement.  06/12/2021 a chest x-ray showed a 4.4 cm left middle lobe. 06/13/2021, CT chest with contrast showed a 4.3 x 3.6 x 3.3 cm lobular spiculated mass in the posterior left upper lobe with T3 to the lateral pleura and major fissure.  Metastatic left hilar lymphadenopathy.  Peripheral micronodularity posterior  right costophrenic sulcus.  Aortic atherosclerosis. 06/14/2021, CT abdomen pelvis showed right lower lobe pulmonary artery embolus.  No evidence of right heart strain.  No acute intra-abdominal or pelvic pathology.  Aortic atherosclerosis.  06/13/2021, patient underwent bronchoscopy with EBUS by Dr. Tamala Julian.  Biopsy from the fine-needle aspiration of station 11 mL showed malignant cells, consistent with poorly differentiated non-small cell carcinoma, consistent with adenocarcinoma.  Malignant cells are TTF-1 positive and negative for p40.  Negative for neuroendocrine markers.  Blood test and tissue sample were tested for Gardant 360  -PD-L1 TPS 97%, no actionable mutation on the blood testing. 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   07/17/2021 Cancer Staging   Staging form: Lung, AJCC 8th Edition - Clinical: Stage IV (cT2b, cN1, cM1) - Signed by [name], MD on 08/06/2021   08/06/2021 Imaging   I saw patient's PET scan after his visit with me on 08/06/21. PET scan was ordered by his previous oncologist group and did not come to my in basket.. Patient received cycle 1 carboplatin Taxol today.  He has started on radiation.  5/26/3 PET scan showed 3.8 cm hypermetabolic left upper lobe mass with hypermetabolic left hilar and infrahilar adenopathy.  There is approximately 8 scattered metastatic lesions in the skeleton. Mixed density photopenic lesion in the left cerebellum. characterized as hemorrhage on recent prior imaging workups.    08/06/2021 - 08/06/2021 Chemotherapy   Patient is on Treatment Plan : LUNG Carboplatin / Paclitaxel + XRT q7d     08/16/2021 -  Chemotherapy   Switched to systemic chemotherapy with  Carboplatin (4.5) + Pemetrexed (400) + Pembrolizumab (200) D1 q21d Induction x 4 cycles        ALLERGIES:  is allergic to niaspan [niacin].  MEDICATIONS:  Current Outpatient Medications  Medication Sig Dispense Refill   amLODipine (NORVASC) 10 MG tablet Take 1 tablet (10 mg  total) by mouth daily. 30 tablet 0   apixaban (ELIQUIS) 2.5 MG TABS tablet Take 1 tablet (2.5 mg total) by mouth 2 (two) times daily. 60 tablet 3   atorvastatin (LIPITOR) 40 MG tablet Take 1 tablet (40 mg total) by mouth daily. 30 tablet 0   collagenase (SANTYL) 250 UNIT/GM ointment Apply 1 Application topically daily. 15 g 0   dexamethasone (DECADRON) 4 MG tablet Take 1 tab every 12 hours the day before pemetrexed chemo, then take 2 tabs once a day for 3 days starting the day after carboplatin. 30 tablet 1   folic acid (FOLVITE) 1 MG tablet Take 1 tablet (1 mg total) by mouth daily. Start 7 days before pemetrexed chemotherapy. Continue until 21 days after pemetrexed completed. 100 tablet 3   insulin glargine (LANTUS) 100 UNIT/ML Solostar Pen Inject 25 Units into the skin daily. (Patient taking differently: Inject 15-30 Units into the skin daily as needed (high blood sugar).) 15 mL 11   losartan (COZAAR) 50 MG tablet TAKE 1 TABLET(50 MG) BY MOUTH DAILY 30 tablet 0   metoprolol succinate (TOPROL-XL) 25 MG 24 hr tablet Take 0.5 tablets (12.5 mg total) by mouth daily. 30 tablet 0   omeprazole (PRILOSEC OTC) 20 MG tablet Take 20 mg by mouth daily.     oxyCODONE-acetaminophen (  PERCOCET) 7.5-325 MG tablet Take 1 tablet by mouth every 4 (four) hours as needed for severe pain. 60 tablet 0   pantoprazole (PROTONIX) 40 MG tablet Take 1 tablet (40 mg total) by mouth daily at 6 (six) AM. 90 tablet 0   topiramate (TOPAMAX) 100 MG tablet TAKE 1 TABLET(100 MG) BY MOUTH AT BEDTIME 30 tablet 0   traZODone (DESYREL) 50 MG tablet Take 1 tablet (50 mg total) by mouth at bedtime as needed for sleep. 30 tablet 1   UNABLE TO FIND PLEASE CHECK FASTING BLOOD GLUCOSE EVERY MORNING 1 each 0   No current facility-administered medications for this visit.   Facility-Administered Medications Ordered in Other Visits  Medication Dose Route Frequency Provider Last Rate Last Admin   CARBOplatin (PARAPLATIN) 390 mg in sodium  chloride 0.9 % 250 mL chemo infusion  390 mg Intravenous Once [name], MD        VITAL SIGNS: There were no vitals taken for this visit. There were no vitals filed for this visit.  Estimated body mass index is 21.77 kg/m as calculated from the following:   Height as of an earlier encounter on 09/06/21: 5' 7" (1.702 m).   Weight as of an earlier encounter on 09/06/21: 139 lb (63 kg).  LABS: CBC:    Component Value Date/Time   WBC 8.4 09/06/2021 0807   HGB 9.5 (L) 09/06/2021 0807   HGB 13.4 07/17/2021 1345   HCT 29.0 (L) 09/06/2021 0807   PLT 664 (H) 09/06/2021 0807   PLT 373 07/17/2021 1345   MCV 93.5 09/06/2021 0807   NEUTROABS 5.9 09/06/2021 0807   LYMPHSABS 1.9 09/06/2021 0807   MONOABS 0.5 09/06/2021 0807   EOSABS 0.0 09/06/2021 0807   BASOSABS 0.0 09/06/2021 0807   Comprehensive Metabolic Panel:    Component Value Date/Time   NA 130 (L) 09/06/2021 0807   K 4.2 09/06/2021 0807   CL 100 09/06/2021 0807   CO2 24 09/06/2021 0807   BUN 21 09/06/2021 0807   CREATININE 0.60 (L) 09/06/2021 0807   CREATININE 0.76 07/17/2021 1345   CREATININE 0.89 06/10/2021 1210   GLUCOSE 152 (H) 09/06/2021 0807   CALCIUM 8.9 09/06/2021 0807   AST 12 (L) 09/06/2021 0807   AST 18 07/17/2021 1345   ALT 12 09/06/2021 0807   ALT 20 07/17/2021 1345   ALKPHOS 127 (H) 09/06/2021 0807   BILITOT 0.4 09/06/2021 0807   BILITOT 0.6 07/17/2021 1345   PROT 7.0 09/06/2021 0807   ALBUMIN 3.5 09/06/2021 0807    RADIOGRAPHIC STUDIES: MR Brain W Wo Contrast  Result Date: 08/22/2021 CLINICAL DATA:  Brain/CNS neoplasm, monitor 3T SRS Protocol; lung cancer EXAM: MRI HEAD WITHOUT AND WITH CONTRAST TECHNIQUE: Multiplanar, multiecho pulse sequences of the brain and surrounding structures were obtained without and with intravenous contrast. CONTRAST:  56m GADAVIST GADOBUTROL 1 MMOL/ML IV SOLN COMPARISON:  06/12/2021 FINDINGS: Brain: Decrease in size of left cerebellar hematoma with late subacute and chronic  blood products present. Surrounding edema has resolved. Mass effect has nearly resolved. No evidence of underlying lesion. More superiorly within the left cerebellar vermis, there is an additional smaller area of hemorrhage that appears more recent. There is minimal curvilinear enhancement and minimal edema. New focus of susceptibility with enhancement in the inferior left frontal gyrus (series 1200, image 200). New foci of susceptibility in the posterior right putamen, right parietal subcortical white matter, and posterior right cerebellum. There may be minimal associated enhancement in the right cerebellum. No acute infarction.  Ventricles and sulci are prominent reflecting mild volume loss. Patchy foci of T2 hyperintensity in the supratentorial white matter nonspecific but may reflect mild chronic microvascular ischemic changes. Vascular: Diminished left vertebral artery flow void as before. Otherwise major vessel flow voids are preserved. Skull and upper cervical spine: Enhancing lesion of the posterior right parietal calvarium with minor extraosseous extension including some epidural involvement. This was present in retrospect and has increased in size. Sinuses/Orbits: Paranasal sinus mucosal thickening. Orbits are unremarkable. Other: Sella is unremarkable.  Mastoid air cells are clear. IMPRESSION: Decrease in size of left cerebellar hematoma with resolution of edema. No evidence of underlying lesion. Smaller, more recent hemorrhage in the left cerebellar vermis with minimal edema. There is minimal enhancement without definite evidence of underlying lesion. New punctate focus of chronic blood products and enhancement in the left frontal lobe. Additional new foci of chronic blood products in the posterior right putamen, right parietal subcortical white matter, and posterior right cerebellum. Unclear at this time if these represent foci of bland hemorrhage or early metastases. 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 Electronically Signed   By: Macy Mis M.D.   On: 08/22/2021 12:17    PERFORMANCE STATUS (ECOG) : 2 - Symptomatic, <50% confined to bed  Review of Systems Unless otherwise noted, a complete review of systems is negative.  Physical Exam General: NAD Pulmonary: Unlabored Extremities: no edema, no joint deformities Skin: no rashes Neurological: Weakness but otherwise nonfocal  IMPRESSION: Follow-up visit.  Patient seen in infusion.  Patient reports that he is doing better overall.  He denies any significant changes or concerns.  No symptomatic complaints today.  He reports improved appetite.  Weights are stable.  No issues with medications and patient reports tolerance of treatments so far.  Patient states that his wife came home after a prolonged hospitalization/ventilator dependent/tracheostomy this week.  He says that she has 24/7 care the patient remains independent with his own care at home.  PLAN: -Continue current scope of treatment -Referral to community palliative care -Follow-up telephone visit 1 month   Patient expressed understanding and was in agreement with this plan. He also understands that He can call the clinic at any time with any questions, concerns, or complaints.     Time Total: 15 minutes  Visit consisted of counseling and education dealing with the complex and emotionally intense issues of symptom management and palliative care in the setting of serious and potentially life-threatening illness.Greater than 50%  of this time was spent counseling and coordinating care related to the above assessment and plan.  Signed by: [name], [name], NP-C

## 2021-09-06 NOTE — Assessment & Plan Note (Signed)
Hb trends down. Monitor counts.  Check iron tibc ferritin, B12, Foalte at next visit.

## 2021-09-06 NOTE — Assessment & Plan Note (Addendum)
Recent MRI brain was reviewed.  Recommend to decrease to Eliquis 2.5mg  BID.

## 2021-09-06 NOTE — Patient Instructions (Signed)
[facility] CANCER CTR AT [place]  Discharge Instructions: Thank you for choosing [facility] to provide your oncology and hematology care.  If you have a lab appointment with the [facility], please go directly to the [facility] and check in at the registration area.  Wear comfortable clothing and clothing appropriate for easy access to any Portacath or PICC line.   We strive to give you quality time with your provider. You may need to reschedule your appointment if you arrive late (15 or more minutes).  Arriving late affects you and other patients whose appointments are after yours.  Also, if you miss three or more appointments without notifying the office, you may be dismissed from the clinic at the provider's discretion.      For prescription refill requests, have your pharmacy contact our office and allow 72 hours for refills to be completed.    Today you received the following chemotherapy and/or immunotherapy agents: CARBOplatin, PEMEtrexed, pembrolizumab   To help prevent nausea and vomiting after your treatment, we encourage you to take your nausea medication as directed.  BELOW ARE SYMPTOMS THAT SHOULD BE REPORTED IMMEDIATELY: *FEVER GREATER THAN 100.4 F (38 C) OR HIGHER *CHILLS OR SWEATING *NAUSEA AND VOMITING THAT IS NOT CONTROLLED WITH YOUR NAUSEA MEDICATION *UNUSUAL SHORTNESS OF BREATH *UNUSUAL BRUISING OR BLEEDING *URINARY PROBLEMS (pain or burning when urinating, or frequent urination) *BOWEL PROBLEMS (unusual diarrhea, constipation, pain near the anus) TENDERNESS IN MOUTH AND THROAT WITH OR WITHOUT PRESENCE OF ULCERS (sore throat, sores in mouth, or a toothache) UNUSUAL RASH, SWELLING OR PAIN  UNUSUAL VAGINAL DISCHARGE OR ITCHING   Items with * indicate a potential emergency and should be followed up as soon as possible or go to the Emergency Department if any problems should occur.  Please show the CHEMOTHERAPY ALERT CARD or IMMUNOTHERAPY  ALERT CARD at check-in to the Emergency Department and triage nurse.  Should you have questions after your visit or need to cancel or reschedule your appointment, please contact [facility] CANCER [place] AT [place]  [phone] and follow the prompts.  Office hours are 8:00 a.m. to 4:30 p.m. Monday - Friday. Please note that voicemails left after 4:00 p.m. may not be returned until the following business day.  We are closed weekends and major holidays. You have access to a nurse at all times for urgent questions. Please call the main number to the clinic [phone] and follow the prompts.  For any non-urgent questions, you may also contact your provider using MyChart. We now offer e-Visits for anyone 51 and older to request care online for non-urgent symptoms. For details visit [email or web address].   Also download the MyChart app! Go to the app store, search "MyChart", open the app, select [facility], and log in with your MyChart username and password.  Masks are optional in the cancer centers. If you would like for your care team to wear a mask while they are taking care of you, please let them know. For doctor visits, patients may have with them one support person who is at least 71 years old. At this time, visitors are not allowed in the infusion area.

## 2021-09-06 NOTE — Assessment & Plan Note (Signed)
Percocet PRN. He rarely utilize it.

## 2021-09-06 NOTE — Assessment & Plan Note (Signed)
Labs are reviewed and discussed with patient. Proceed with carboplatin/Alimta/Keytruda today.

## 2021-09-07 ENCOUNTER — Encounter: Payer: Self-pay | Admitting: Oncology

## 2021-09-07 NOTE — Assessment & Plan Note (Signed)
MRI images were reviewed and discussed with patient.  Decrease size of left cerebellar hematoma.  Smaller more recent hemorrhage in left cerebellar vermis. New puntate focus of chronic blood products and enhancement in the left frontal lobe. New foci of chronic blood. Increase size of osseous metastasis right parietal  I recommend to decrease Eliquis 2.5mg  BID.  Repeat MRI brain in 2 months.

## 2021-09-07 NOTE — Assessment & Plan Note (Signed)
Likely reactive. Monitor.

## 2021-09-07 NOTE — Progress Notes (Signed)
Hematology/Oncology Progress note [phone] [phone]            Patient Care Team: Default, Provider, MD as PCP - General [name], RN as Oncology Nurse Navigator   ASSESSMENT & PLAN:   Primary non-small cell carcinoma of upper lobe of left lung [facility]) Labs are reviewed and discussed with patient. Proceed with carboplatin/Alimta/Keytruda today.   Encounter for antineoplastic chemotherapy Chemotherapy plan as listed above  Pulmonary embolus [facility]) Recent MRI brain was reviewed.  Recommend to decrease to Eliquis 2.5mg  BID.   Neoplasm related pain Percocet PRN. He rarely utilize it.   Anemia due to antineoplastic chemotherapy Hb trends down. Monitor counts.  Check iron tibc ferritin, B12, Foalte at next visit.  Metastasis to bone (HCC) Recommend bisphosphonate.  Awaiting for dental clearance.   Intraparenchymal hemorrhage of brain [facility]) MRI images were reviewed and discussed with patient.  Decrease size of left cerebellar hematoma.  Smaller more recent hemorrhage in left cerebellar vermis. New puntate focus of chronic blood products and enhancement in the left frontal lobe. New foci of chronic blood. Increase size of osseous metastasis right parietal  I recommend to decrease Eliquis 2.5mg  BID.  Repeat MRI brain in 2 months.   Thrombocytosis Likely reactive. Monitor.   No orders of the defined types were placed in this encounter.  Treatment today.  1 week. Lab NP  Lab MD carboplatin Alimta [name]. 3 weeks  All questions were answered. The patient knows to call the clinic with any problems, questions or concerns.  Micheal Server, MD, [name] [facility] Hematology Oncology 09/06/2021      CHIEF COMPLAINTS/REASON FOR VISIT:  non-small cell lung cancer  HISTORY OF PRESENTING ILLNESS:   Micheal Donovan is a  71 y.o.  male presents for follow up of Non-small cell lung cancer.  Oncology History Overview Note  Diagnosis: Stage IIB T2b N1  M0 adenocarcinoma of the LUL, poorly differentiated     Primary non-small cell carcinoma of upper lobe of left lung ([place])  06/13/2021 Initial Diagnosis   Primary non-small cell carcinoma of upper lobe of left lung [facility]) -06/20/2021 - 06/27/2021, patient presented to [facility] due to progressive headache/dizziness/gait changes. CT head showed acute to subacute intraparenchymal hemorrhage involving the left cerebellum.  Surrounding low-density vasogenic edema.  Patient was transferred to [facility]. This was further evaluated by CT angiogram of the neck which showed bulky calcified plaques/stenosis of carotid artery, Followed by MRI brain. 06/12/2021, MRI of the brain showed no significant interval change in size of the left cerebellar intraparenchymal hematoma with unchanged regional mass effect and partial effacement of fourth ventricle but no upstream hydrocephalus. There is no discernible enhancement to suggest underlying mass lesion, though acute blood products could mask enhancement.   06/12/2021 a chest x-ray showed a 4.4 cm left middle lobe. 06/13/2021, CT chest with contrast showed a 4.3 x 3.6 x 3.3 cm lobular spiculated mass in the posterior left upper lobe with T3 to the lateral pleura and major fissure.  Metastatic left hilar lymphadenopathy.  Peripheral micronodularity posterior right costophrenic sulcus.  Aortic atherosclerosis. 06/14/2021, CT abdomen pelvis showed right lower lobe pulmonary artery embolus.  No evidence of right heart strain.  No acute intra-abdominal or pelvic pathology.  Aortic atherosclerosis.  06/13/2021, patient underwent bronchoscopy with EBUS by Dr. Tamala Julian.  Biopsy from the fine-needle aspiration of station 11 mL showed malignant cells, consistent with poorly differentiated non-small cell carcinoma, consistent with adenocarcinoma.  Malignant cells are TTF-1 positive and  negative for p40.  Negative for neuroendocrine markers.  Blood test and tissue sample were  tested for Gardant 360  -PD-L1 TPS 97%, no actionable mutation on the blood testing. 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   07/17/2021 Cancer Staging   Staging form: Lung, AJCC 8th Edition - Clinical: Stage IV (cT2b, cN1, cM1) - Signed by [name], MD on 08/06/2021   08/06/2021 Imaging   I saw patient's PET scan after his visit with me on 08/06/21. PET scan was ordered by his previous oncologist group and did not come to my in basket.. Patient received cycle 1 carboplatin Taxol today.  He has started on radiation.  5/26/3 PET scan showed 3.8 cm hypermetabolic left upper lobe mass with hypermetabolic left hilar and infrahilar adenopathy.  There is approximately 8 scattered metastatic lesions in the skeleton. Mixed density photopenic lesion in the left cerebellum. characterized as hemorrhage on recent prior imaging workups.    08/06/2021 - 08/06/2021 Chemotherapy   Patient is on Treatment Plan : LUNG Carboplatin / Paclitaxel + XRT q7d     08/16/2021 -  Chemotherapy   Switched to systemic chemotherapy with  Carboplatin (4.5) + Pemetrexed (400) + Pembrolizumab (200) D1 q21d Induction x 4 cycles      08/22/2021 Imaging   MRI brain w wo contrast  Decrease in size of left cerebellar hematoma with resolution of edema. No evidence of underlying lesion. Smaller, more recent hemorrhage in the left cerebellar vermis with minimal edema. There is minimal enhancement without definite evidence of underlying lesion.  New punctate focus of chronic blood products and enhancement in the left frontal lobe. Additional new foci of chronic blood products in the posterior right putamen, right parietal subcortical white matter, and posterior right cerebellum. Unclear at this time if these represent foci of bland hemorrhage or early metastases.   Increase in size of right parietal osseous metastasis with minor extraosseous extension.    # Patient has a history of melanoma on his neck, treated in 2009. #  History of hemorrhagic infarct left cerebellum  # Pulmonary embolism, SOB is stable, not worse.  He tolerates chemotherapy. + fatigue , no nausea vomiting diarrhea.   Review of Systems  Constitutional:  Positive for fatigue. Negative for appetite change, chills, fever and unexpected weight change.  HENT:   Negative for hearing loss and voice change.   Eyes:  Negative for eye problems and icterus.  Respiratory:  Positive for shortness of breath. Negative for chest tightness and cough.   Cardiovascular:  Negative for chest pain and leg swelling.  Gastrointestinal:  Negative for abdominal distention, abdominal pain and blood in stool.  Endocrine: Negative for hot flashes.  Genitourinary:  Negative for difficulty urinating, dysuria and frequency.   Musculoskeletal:  Negative for arthralgias.  Skin:  Negative for itching and rash.  Neurological:  Positive for headaches. Negative for extremity weakness, light-headedness and numbness.  Hematological:  Negative for adenopathy. Does not bruise/bleed easily.  Psychiatric/Behavioral:  Negative for confusion.     MEDICAL HISTORY:  Past Medical History:  Diagnosis Date   Allergy    BPH (benign prostatic hypertrophy)    Cancer (HCC)    Melanoma on Neck    2008   Carotid artery occlusion    Diabetes mellitus    type 2   ED (erectile dysfunction)    GERD (gastroesophageal reflux disease)    Hemorrhagic stroke (HCC) 06/2021   Hyperlipidemia    Hypertension    Lung cancer (HCC)  Retinopathy due to secondary DM [facility])     SURGICAL HISTORY: Past Surgical History:  Procedure Laterality Date   BRONCHIAL NEEDLE ASPIRATION BIOPSY  06/17/2021   Procedure: BRONCHIAL NEEDLE ASPIRATION BIOPSIES;  Surgeon: Candee Furbish, MD;  Location[facility] ENDOSCOPY;  Service: Pulmonary;;   IR IMAGING GUIDED PORT INSERTION  08/05/2021   MELANOMA EXCISION  2008   Left side of neck   RADIOLOGY WITH ANESTHESIA N/A 04/05/2020   Procedure: MRI SPINE WITOUT CONTRAST;   Surgeon: Radiologist, Medication, MD;  Location: [place];  Service: Radiology;  Laterality: N/A;   RADIOLOGY WITH ANESTHESIA N/A 08/22/2021   Procedure: MRI BRAIN WITH AND WITHOUT CONTRAST  WITH ANESTHESIA;  Surgeon: Radiologist, Medication, MD;  Location: [place];  Service: Radiology;  Laterality: N/A;   TONSILLECTOMY     VIDEO BRONCHOSCOPY WITH ENDOBRONCHIAL ULTRASOUND N/A 06/17/2021   Procedure: VIDEO BRONCHOSCOPY WITH ENDOBRONCHIAL ULTRASOUND;  Surgeon: Candee Furbish, MD;  Location: [facility] ENDOSCOPY;  Service: Pulmonary;  Laterality: N/A;    SOCIAL HISTORY: Social History   Socioeconomic History   Marital status: Married    Spouse name: Not on file   Number of children: Not on file   Years of education: Not on file   Highest education level: Not on file  Occupational History   Not on file  Tobacco Use   Smoking status: Former    Types: Cigarettes    Quit date: 07/21/1990    Years since quitting: 31.1   Smokeless tobacco: Never  Vaping Use   Vaping Use: Never used  Substance and Sexual Activity   Alcohol use: No   Drug use: No   Sexual activity: Not on file  Other Topics Concern   Not on file  Social History Narrative   Not on file   Social Determinants of Health   Financial Resource Strain: Low Risk  (07/31/2021)   Overall Financial Resource Strain (CARDIA)    Difficulty of Paying Living Expenses: Not very hard  Food Insecurity: No Food Insecurity (07/31/2021)   Hunger Vital Sign    Worried About Running Out of Food in the Last Year: Never true    Ran Out of Food in the Last Year: Never true  Transportation Needs: No Transportation Needs (07/31/2021)   PRAPARE - Hydrologist (Medical): No    Lack of Transportation (Non-Medical): No  Physical Activity: Inactive (07/31/2021)   Exercise Vital Sign    Days of Exercise per Week: 0 days    Minutes of Exercise per Session: 0 min  Stress: Unknown (07/31/2021)   [place]    Feeling of Stress : Patient refused  Social Connections: Unknown (07/31/2021)   Social Connection and Isolation Panel [NHANES]    Frequency of Communication with Friends and Family: Three times a week    Frequency of Social Gatherings with Friends and Family: Three times a week    Attends Religious Services: Patient refused    Active Member of Clubs or Organizations: Patient refused    Attends Archivist Meetings: Patient refused    Marital Status: Married  Human resources officer Violence: Not At Risk (07/31/2021)   Humiliation, Afraid, Rape, and Kick questionnaire    Fear of Current or Ex-Partner: No    Emotionally Abused: No    Physically Abused: No    Sexually Abused: No    FAMILY HISTORY: Family History  Problem Relation Age of Onset   COPD Mother  Heart disease Father    Heart disease Brother        MI at age 85   Stroke Neg Hx     ALLERGIES:  is allergic to niaspan [niacin].  MEDICATIONS:  Current Outpatient Medications  Medication Sig Dispense Refill   amLODipine (NORVASC) 10 MG tablet Take 1 tablet (10 mg total) by mouth daily. 30 tablet 0   apixaban (ELIQUIS) 2.5 MG TABS tablet Take 1 tablet (2.5 mg total) by mouth 2 (two) times daily. 60 tablet 3   atorvastatin (LIPITOR) 40 MG tablet Take 1 tablet (40 mg total) by mouth daily. 30 tablet 0   collagenase (SANTYL) 250 UNIT/GM ointment Apply 1 Application topically daily. 15 g 0   dexamethasone (DECADRON) 4 MG tablet Take 1 tab every 12 hours the day before pemetrexed chemo, then take 2 tabs once a day for 3 days starting the day after carboplatin. 30 tablet 1   folic acid (FOLVITE) 1 MG tablet Take 1 tablet (1 mg total) by mouth daily. Start 7 days before pemetrexed chemotherapy. Continue until 21 days after pemetrexed completed. 100 tablet 3   insulin glargine (LANTUS) 100 UNIT/ML Solostar Pen Inject 25 Units into the skin daily. (Patient taking differently: Inject 15-30 Units  into the skin daily as needed (high blood sugar).) 15 mL 11   losartan (COZAAR) 50 MG tablet TAKE 1 TABLET(50 MG) BY MOUTH DAILY 30 tablet 0   metoprolol succinate (TOPROL-XL) 25 MG 24 hr tablet Take 0.5 tablets (12.5 mg total) by mouth daily. 30 tablet 0   omeprazole (PRILOSEC OTC) 20 MG tablet Take 20 mg by mouth daily.     oxyCODONE-acetaminophen (PERCOCET) 7.5-325 MG tablet Take 1 tablet by mouth every 4 (four) hours as needed for severe pain. 60 tablet 0   pantoprazole (PROTONIX) 40 MG tablet Take 1 tablet (40 mg total) by mouth daily at 6 (six) AM. 90 tablet 0   topiramate (TOPAMAX) 100 MG tablet TAKE 1 TABLET(100 MG) BY MOUTH AT BEDTIME 30 tablet 0   traZODone (DESYREL) 50 MG tablet Take 1 tablet (50 mg total) by mouth at bedtime as needed for sleep. 30 tablet 1   UNABLE TO FIND PLEASE CHECK FASTING BLOOD GLUCOSE EVERY MORNING 1 each 0   No current facility-administered medications for this visit.     PHYSICAL EXAMINATION:  Vitals:   09/06/21 0832  BP: 108/61  Pulse: 76  Temp: (!) 97.5 F (36.4 C)   Filed Weights   09/06/21 0832  Weight: 139 lb (63 kg)    Physical Exam Constitutional:      General: He is not in acute distress.    Comments: Patient sits in the wheelchair  HENT:     Head: Normocephalic and atraumatic.  Eyes:     General: No scleral icterus. Cardiovascular:     Rate and Rhythm: Normal rate and regular rhythm.     Heart sounds: Normal heart sounds.  Pulmonary:     Effort: Pulmonary effort is normal. No respiratory distress.     Breath sounds: No wheezing.     Comments: Decreased breath sound bilaterally. Abdominal:     General: Bowel sounds are normal. There is no distension.     Palpations: Abdomen is soft.  Musculoskeletal:        General: No deformity. Normal range of motion.     Cervical back: Normal range of motion and neck supple.  Skin:    General: Skin is warm and dry.  Findings: No erythema or rash.  Neurological:     Mental Status:  He is alert and oriented to person, place, and time. Mental status is at baseline.     Cranial Nerves: No cranial nerve deficit.     Coordination: Coordination normal.  Psychiatric:        Mood and Affect: Mood normal.     LABORATORY DATA:  I have reviewed the data as listed Lab Results  Component Value Date   WBC 8.4 09/06/2021   HGB 9.5 (L) 09/06/2021   HCT 29.0 (L) 09/06/2021   MCV 93.5 09/06/2021   PLT 664 (H) 09/06/2021   Recent Labs    08/16/21 0803 08/23/21 0928 09/06/21 0807  NA 133* 129* 130*  K 4.3 4.2 4.2  CL 103 97* 100  CO2 $Re'22 23 24  'Dyg$ GLUCOSE 214* 183* 152*  BUN $Re'15 23 21  'erf$ CREATININE 0.56* 0.66 0.60*  CALCIUM 9.3 9.0 8.9  GFRNONAA >60 >60 >60  PROT 7.3 7.0 7.0  ALBUMIN 3.6 3.5 3.5  AST 12* 14* 12*  ALT $Re'10 10 12  'WUH$ ALKPHOS 103 114 127*  BILITOT 0.4 0.4 0.4    Iron/TIBC/Ferritin/ %Sat No results found for: "IRON", "TIBC", "FERRITIN", "IRONPCTSAT"    RADIOGRAPHIC STUDIES: I have personally reviewed the radiological images as listed and agreed with the findings in the report. MR Brain W Wo Contrast  Result Date: 08/22/2021 CLINICAL DATA:  Brain/CNS neoplasm, monitor 3T SRS Protocol; lung cancer EXAM: MRI HEAD WITHOUT AND WITH CONTRAST TECHNIQUE: Multiplanar, multiecho pulse sequences of the brain and surrounding structures were obtained without and with intravenous contrast. CONTRAST:  81mL GADAVIST GADOBUTROL 1 MMOL/ML IV SOLN COMPARISON:  06/12/2021 FINDINGS: Brain: Decrease in size of left cerebellar hematoma with late subacute and chronic blood products present. Surrounding edema has resolved. Mass effect has nearly resolved. No evidence of underlying lesion. More superiorly within the left cerebellar vermis, there is an additional smaller area of hemorrhage that appears more recent. There is minimal curvilinear enhancement and minimal edema. New focus of susceptibility with enhancement in the inferior left frontal gyrus (series 1200, image 200). New foci of  susceptibility in the posterior right putamen, right parietal subcortical white matter, and posterior right cerebellum. There may be minimal associated enhancement in the right cerebellum. No acute infarction. Ventricles and sulci are prominent reflecting mild volume loss. Patchy foci of T2 hyperintensity in the supratentorial white matter nonspecific but may reflect mild chronic microvascular ischemic changes. Vascular: Diminished left vertebral artery flow void as before. Otherwise major vessel flow voids are preserved. Skull and upper cervical spine: Enhancing lesion of the posterior right parietal calvarium with minor extraosseous extension including some epidural involvement. This was present in retrospect and has increased in size. Sinuses/Orbits: Paranasal sinus mucosal thickening. Orbits are unremarkable. Other: Sella is unremarkable.  Mastoid air cells are clear. IMPRESSION: Decrease in size of left cerebellar hematoma with resolution of edema. No evidence of underlying lesion. Smaller, more recent hemorrhage in the left cerebellar vermis with minimal edema. There is minimal enhancement without definite evidence of underlying lesion. New punctate focus of chronic blood products and enhancement in the left frontal lobe. Additional new foci of chronic blood products in the posterior right putamen, right parietal subcortical white matter, and posterior right cerebellum. Unclear at this time if these represent foci of bland hemorrhage or early metastases. Increase in size of right parietal osseous metastasis with minor extraosseous extension. Electronically Signed   By: Macy Mis M.D.   On: 08/22/2021  12:17    

## 2021-09-07 NOTE — Assessment & Plan Note (Signed)
Recommend bisphosphonate.  Awaiting for dental clearance.

## 2021-09-09 ENCOUNTER — Ambulatory Visit: Payer: PPO

## 2021-09-10 ENCOUNTER — Ambulatory Visit: Payer: PPO

## 2021-09-10 ENCOUNTER — Other Ambulatory Visit: Payer: Self-pay | Admitting: Family Medicine

## 2021-09-11 ENCOUNTER — Ambulatory Visit: Payer: PPO

## 2021-09-12 ENCOUNTER — Other Ambulatory Visit: Payer: Self-pay | Admitting: Family Medicine

## 2021-09-12 ENCOUNTER — Ambulatory Visit: Payer: PPO

## 2021-09-12 NOTE — Telephone Encounter (Signed)
Requested Prescriptions  Pending Prescriptions Disp Refills  . atorvastatin (LIPITOR) 40 MG tablet [Pharmacy Med Name: ATORVASTATIN 40MG  TABLETS] 30 tablet 0    Sig: TAKE 1 TABLET(40 MG) BY MOUTH DAILY     Cardiovascular:  Antilipid - Statins Failed - 09/12/2021  3:42 AM      Failed - Lipid Panel in normal range within the last 12 months    Cholesterol  Date Value Ref Range Status  06/12/2021 266 (H) 0 - 200 mg/dL Final   LDL Cholesterol (Calc)  Date Value Ref Range Status  04/29/2017 81 mg/dL (calc) Final    Comment:    Reference range: <100 . Desirable range <100 mg/dL for primary prevention;   <70 mg/dL for patients with CHD or diabetic patients  with > or = 2 CHD risk factors. [name] LDL-C is now calculated using the Martin-Hopkins  calculation, which is a validated novel method providing  better accuracy than the Friedewald equation in the  estimation of LDL-C.  Cresenciano Genre et al. Annamaria Helling. 5809;983(38): 2061-2068  (http://education.[facility].com/faq/FAQ164)    LDL Cholesterol  Date Value Ref Range Status  06/12/2021 213 (H) 0 - 99 mg/dL Final    Comment:           Total Cholesterol/HDL:CHD Risk Coronary Heart Disease Risk Table                     Men   Women  1/2 Average Risk   3.4   3.3  Average Risk       5.0   4.4  2 X Average Risk   9.6   7.1  3 X Average Risk  23.4   11.0        Use the calculated Patient Ratio above and the CHD Risk Table to determine the patient's CHD Risk.        ATP III CLASSIFICATION (LDL):  <100     mg/dL   Optimal  100-129  mg/dL   Near or Above                    Optimal  130-159  mg/dL   Borderline  160-189  mg/dL   High  >190     mg/dL   Very High Performed at [facility] [address] [place], [place] [address]    HDL  Date Value Ref Range Status  06/12/2021 24 (L) >40 mg/dL Final   Triglycerides  Date Value Ref Range Status  06/12/2021 144 <150 mg/dL Final         Passed - Patient is not pregnant       Passed - Valid encounter within last 12 months    Recent Outpatient Visits          2 months ago Uncontrolled type 2 diabetes mellitus with hypoglycemia, unspecified hypoglycemia coma status ([place])   [place] Pickard, Cammie Mcgee, MD   3 months ago Uncontrolled type 2 diabetes mellitus with hypoglycemia, unspecified hypoglycemia coma status ([place])   [place] Pickard, Cammie Mcgee, MD   2 years ago Pruritus   [place]enn[place] Schaumann, Cammie Mcgee, MD   4 years ago Encounter for hepatitis C screening test for low risk patient   [place] [name], MD   4 years ago Type 2 diabetes mellitus without complication, with long-term current use of insulin ([place])   [facility] [place], Modena Nunnery, MD  Future Appointments            In 1 month Raulkar, Clide Deutscher, MD [facility], CPR           . amLODipine (Forada) 10 MG tablet [Pharmacy Med Name: AMLODIPINE BESYLATE 10MG  TABLETS] 30 tablet 0    Sig: TAKE 1 TABLET(10 MG) BY MOUTH DAILY     Cardiovascular: Calcium Channel Blockers 2 Passed - 09/12/2021  3:42 AM      Passed - Last BP in normal range    BP Readings from Last 1 Encounters:  09/06/21 108/61         Passed - Last Heart Rate in normal range    Pulse Readings from Last 1 Encounters:  09/06/21 76         Passed - Valid encounter within last 6 months    Recent Outpatient Visits          2 months ago Uncontrolled type 2 diabetes mellitus with hypoglycemia, unspecified hypoglycemia coma status ([place])   [place] Pickard, Cammie Mcgee, MD   3 months ago Uncontrolled type 2 diabetes mellitus with hypoglycemia, unspecified hypoglycemia coma status ([place])   [place] Pickard, Cammie Mcgee, MD   2 years ago Pruritus   [place] Dennard Schaumann, Cammie Mcgee, MD   4 years ago Encounter for hepatitis C screening test for low risk  patient   [place] [name], MD   4 years ago Type 2 diabetes mellitus without complication, with long-term current use of insulin ([place])   [place], Modena Nunnery, MD      Future Appointments            In 1 month Raulkar, Clide Deutscher, MD [place] [facility], [facility]           . losartan (COZAAR) 50 MG tablet [Pharmacy Med Name: LOSARTAN 50MG  TABLETS] 30 tablet 0    Sig: TAKE 1 TABLET(50 MG) BY MOUTH DAILY     Cardiovascular:  Angiotensin Receptor Blockers Failed - 09/12/2021  3:42 AM      Failed - Cr in normal range and within 180 days    Creatinine  Date Value Ref Range Status  07/17/2021 0.76 0.61 - 1.24 mg/dL Final   Creat  Date Value Ref Range Status  06/10/2021 0.89 0.70 - 1.28 mg/dL Final   Creatinine, Ser  Date Value Ref Range Status  09/06/2021 0.60 (L) 0.61 - 1.24 mg/dL Final   Creatinine, Urine  Date Value Ref Range Status  03/06/2016 162 20 - 370 mg/dL Final         Passed - K in normal range and within 180 days    Potassium  Date Value Ref Range Status  09/06/2021 4.2 3.5 - 5.1 mmol/L Final         Passed - Patient is not pregnant      Passed - Last BP in normal range    BP Readings from Last 1 Encounters:  09/06/21 108/61         Passed - Valid encounter within last 6 months    Recent Outpatient Visits          2 months ago Uncontrolled type 2 diabetes mellitus with hypoglycemia, unspecified hypoglycemia coma status ([place])   [place] [name], MD   3 months ago Uncontrolled type 2 diabetes mellitus with hypoglycemia, unspecified hypoglycemia coma status (  [place])   [place] Pickard, Cammie Mcgee, MD   2 years ago Pruritus   [place] Dennard Schaumann, Cammie Mcgee, MD   4 years ago Encounter for hepatitis C screening test for low risk patient   [place] [name], MD   4 years ago Type 2 diabetes  mellitus without complication, with long-term current use of insulin ([place])   [place], Modena Nunnery, MD      Future Appointments            In 1 month Raulkar, Clide Deutscher, MD [place] [facility], CPR

## 2021-09-13 ENCOUNTER — Ambulatory Visit: Payer: PPO

## 2021-09-16 ENCOUNTER — Ambulatory Visit: Payer: PPO

## 2021-09-16 DIAGNOSIS — Z7901 Long term (current) use of anticoagulants: Secondary | ICD-10-CM | POA: Diagnosis not present

## 2021-09-16 DIAGNOSIS — I2699 Other pulmonary embolism without acute cor pulmonale: Secondary | ICD-10-CM | POA: Diagnosis not present

## 2021-09-16 DIAGNOSIS — R918 Other nonspecific abnormal finding of lung field: Secondary | ICD-10-CM | POA: Diagnosis not present

## 2021-09-16 DIAGNOSIS — L89150 Pressure ulcer of sacral region, unstageable: Secondary | ICD-10-CM | POA: Diagnosis not present

## 2021-09-16 DIAGNOSIS — I614 Nontraumatic intracerebral hemorrhage in cerebellum: Secondary | ICD-10-CM | POA: Diagnosis not present

## 2021-09-16 DIAGNOSIS — C3412 Malignant neoplasm of upper lobe, left bronchus or lung: Secondary | ICD-10-CM | POA: Diagnosis not present

## 2021-09-16 DIAGNOSIS — I6529 Occlusion and stenosis of unspecified carotid artery: Secondary | ICD-10-CM | POA: Diagnosis not present

## 2021-09-17 ENCOUNTER — Ambulatory Visit: Payer: PPO

## 2021-09-18 ENCOUNTER — Inpatient Hospital Stay: Payer: PPO

## 2021-09-18 ENCOUNTER — Inpatient Hospital Stay: Payer: PPO | Admitting: Hospice and Palliative Medicine

## 2021-09-18 ENCOUNTER — Telehealth: Payer: Self-pay | Admitting: *Deleted

## 2021-09-18 ENCOUNTER — Ambulatory Visit: Payer: PPO

## 2021-09-18 NOTE — Telephone Encounter (Signed)
Patient did not keep his f/u apt today with Josh and labs. I personally contacted the patient to reach out. Pt stated that he didn't know about the apt. 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 Pt given new apt tom to see Josh at 930 am and labs prior to the np apt.

## 2021-09-19 ENCOUNTER — Other Ambulatory Visit: Payer: Self-pay

## 2021-09-19 ENCOUNTER — Other Ambulatory Visit: Payer: Self-pay | Admitting: Oncology

## 2021-09-19 ENCOUNTER — Encounter: Payer: Self-pay | Admitting: Hospice and Palliative Medicine

## 2021-09-19 ENCOUNTER — Inpatient Hospital Stay (HOSPITAL_BASED_OUTPATIENT_CLINIC_OR_DEPARTMENT_OTHER): Payer: PPO | Admitting: Hospice and Palliative Medicine

## 2021-09-19 ENCOUNTER — Inpatient Hospital Stay: Payer: PPO

## 2021-09-19 ENCOUNTER — Ambulatory Visit: Payer: PPO

## 2021-09-19 VITALS — BP 105/57 | HR 70 | Temp 98.9°F | Resp 20 | Ht 67.0 in | Wt 148.9 lb

## 2021-09-19 DIAGNOSIS — G893 Neoplasm related pain (acute) (chronic): Secondary | ICD-10-CM | POA: Diagnosis not present

## 2021-09-19 DIAGNOSIS — Z515 Encounter for palliative care: Secondary | ICD-10-CM | POA: Diagnosis not present

## 2021-09-19 DIAGNOSIS — C3412 Malignant neoplasm of upper lobe, left bronchus or lung: Secondary | ICD-10-CM

## 2021-09-19 DIAGNOSIS — D6481 Anemia due to antineoplastic chemotherapy: Secondary | ICD-10-CM

## 2021-09-19 DIAGNOSIS — Z5111 Encounter for antineoplastic chemotherapy: Secondary | ICD-10-CM | POA: Diagnosis not present

## 2021-09-19 LAB — CBC WITH DIFFERENTIAL/PLATELET
Abs Immature Granulocytes: 0.02 10*3/uL (ref 0.00–0.07)
Basophils Absolute: 0 10*3/uL (ref 0.0–0.1)
Basophils Relative: 0 %
Eosinophils Absolute: 0 10*3/uL (ref 0.0–0.5)
Eosinophils Relative: 1 %
HCT: 28.8 % — ABNORMAL LOW (ref 39.0–52.0)
Hemoglobin: 9.1 g/dL — ABNORMAL LOW (ref 13.0–17.0)
Immature Granulocytes: 1 %
Lymphocytes Relative: 28 %
Lymphs Abs: 1.1 10*3/uL (ref 0.7–4.0)
MCH: 31.1 pg (ref 26.0–34.0)
MCHC: 31.6 g/dL (ref 30.0–36.0)
MCV: 98.3 fL (ref 80.0–100.0)
Monocytes Absolute: 0.4 10*3/uL (ref 0.1–1.0)
Monocytes Relative: 10 %
Neutro Abs: 2.4 10*3/uL (ref 1.7–7.7)
Neutrophils Relative %: 60 %
Platelets: 147 10*3/uL — ABNORMAL LOW (ref 150–400)
RBC: 2.93 MIL/uL — ABNORMAL LOW (ref 4.22–5.81)
RDW: 16.7 % — ABNORMAL HIGH (ref 11.5–15.5)
WBC: 3.9 10*3/uL — ABNORMAL LOW (ref 4.0–10.5)
nRBC: 0 % (ref 0.0–0.2)

## 2021-09-19 LAB — COMPREHENSIVE METABOLIC PANEL
ALT: 15 U/L (ref 0–44)
AST: 19 U/L (ref 15–41)
Albumin: 3.4 g/dL — ABNORMAL LOW (ref 3.5–5.0)
Alkaline Phosphatase: 129 U/L — ABNORMAL HIGH (ref 38–126)
Anion gap: 4 — ABNORMAL LOW (ref 5–15)
BUN: 12 mg/dL (ref 8–23)
CO2: 25 mmol/L (ref 22–32)
Calcium: 8.4 mg/dL — ABNORMAL LOW (ref 8.9–10.3)
Chloride: 108 mmol/L (ref 98–111)
Creatinine, Ser: 0.59 mg/dL — ABNORMAL LOW (ref 0.61–1.24)
GFR, Estimated: >60 mL/min (ref >60)
Glucose, Bld: 183 mg/dL — ABNORMAL HIGH (ref 70–99)
Potassium: 3.7 mmol/L (ref 3.5–5.1)
Sodium: 137 mmol/L (ref 135–145)
Total Bilirubin: 0.5 mg/dL (ref 0.3–1.2)
Total Protein: 6.3 g/dL — ABNORMAL LOW (ref 6.5–8.1)

## 2021-09-19 LAB — IRON AND TIBC
Iron: 50 ug/dL (ref 45–182)
Saturation Ratios: 16 % — ABNORMAL LOW (ref 17.9–39.5)
TIBC: 305 ug/dL (ref 250–450)
UIBC: 255 ug/dL

## 2021-09-19 LAB — FERRITIN: Ferritin: 249 ng/mL (ref 24–336)

## 2021-09-19 LAB — RETIC PANEL
Immature Retic Fract: 26.3 % — ABNORMAL HIGH (ref 2.3–15.9)
RBC.: 2.88 MIL/uL — ABNORMAL LOW (ref 4.22–5.81)
Retic Count, Absolute: 79.5 10*3/uL (ref 19.0–186.0)
Retic Ct Pct: 2.8 % (ref 0.4–3.1)
Reticulocyte Hemoglobin: 39.8 pg (ref >27.9)

## 2021-09-19 LAB — VITAMIN B12: Vitamin B-12: 122 pg/mL — ABNORMAL LOW (ref 180–914)

## 2021-09-19 LAB — FOLATE: Folate: 13.4 ng/mL (ref >5.9)

## 2021-09-19 NOTE — Progress Notes (Signed)
[place] at [facility] [phone] [phone]   Name: Micheal Donovan Date: 09/19/2021 MRN: [id number]  DOB: 1950/09/19  Patient Care Team: Default, Provider, MD as PCP - General [name], RN as Oncology Nurse Navigator    REASON FOR CONSULTATION: Micheal Donovan is a 71 y.o. male with multiple medical problems including non-small cell lung cancer, history of subacute intraparenchymal hemorrhage, PE.  Patient is on systemic chemotherapy and XRT.  He has had decreased appetite and depression and was referred to palliative care to help address goals and manage ongoing symptoms.  SOCIAL HISTORY:     reports that he quit smoking about 31 years ago. His smoking use included cigarettes. He has never used smokeless tobacco. He reports that he does not drink alcohol and does not use drugs.  Patient is married.  He has a daughter who is his primary caregiver.  Patient previously worked as an Customer service manager  ADVANCE DIRECTIVES:  On file  CODE STATUS:   PAST MEDICAL HISTORY: Past Medical History:  Diagnosis Date   Allergy    BPH (benign prostatic hypertrophy)    Cancer ([place])    Melanoma on Neck    2008   Carotid artery occlusion    Diabetes mellitus    type 2   ED (erectile dysfunction)    GERD (gastroesophageal reflux disease)    Hemorrhagic stroke ([place]) 06/2021   Hyperlipidemia    Hypertension    Lung cancer ([place])    Retinopathy due to secondary DM ([place])     PAST SURGICAL HISTORY:  Past Surgical History:  Procedure Laterality Date   BRONCHIAL NEEDLE ASPIRATION BIOPSY  06/17/2021   Procedure: BRONCHIAL NEEDLE ASPIRATION BIOPSIES;  Surgeon: Candee Furbish, MD;  Location: [place];  Service: Pulmonary;;   IR IMAGING GUIDED PORT INSERTION  08/05/2021   MELANOMA EXCISION  2008   Left side of neck   RADIOLOGY WITH ANESTHESIA N/A 04/05/2020   Procedure: MRI SPINE WITOUT CONTRAST;  Surgeon:  Radiologist, Medication, MD;  Location: [place];  Service: Radiology;  Laterality: N/A;   RADIOLOGY WITH ANESTHESIA N/A 08/22/2021   Procedure: MRI BRAIN WITH AND WITHOUT CONTRAST  WITH ANESTHESIA;  Surgeon: Radiologist, Medication, MD;  Location: [place];  Service: Radiology;  Laterality: N/A;   TONSILLECTOMY     VIDEO BRONCHOSCOPY WITH ENDOBRONCHIAL ULTRASOUND N/A 06/17/2021   Procedure: VIDEO BRONCHOSCOPY WITH ENDOBRONCHIAL ULTRASOUND;  Surgeon: Candee Furbish, MD;  Location: [facility] ENDOSCOPY;  Service: Pulmonary;  Laterality: N/A;    HEMATOLOGY/ONCOLOGY HISTORY:  Oncology History Overview Note  Diagnosis: Stage IIB T2b N1 M0 adenocarcinoma of the LUL, poorly differentiated     Primary non-small cell carcinoma of upper lobe of left lung ([place])  06/13/2021 Initial Diagnosis   Primary non-small cell carcinoma of upper lobe of left lung [facility]) -06/20/2021 - 06/27/2021, patient presented to [facility] due to progressive headache/dizziness/gait changes. CT head showed acute to subacute intraparenchymal hemorrhage involving the left cerebellum.  Surrounding low-density vasogenic edema.  Patient was transferred to [facility]. This was further evaluated by CT angiogram of the neck which showed bulky calcified plaques/stenosis of carotid artery, Followed by MRI brain. 06/12/2021, MRI of the brain showed no significant interval change in size of the left cerebellar intraparenchymal hematoma with unchanged regional mass effect and partial effacement of fourth ventricle but no upstream hydrocephalus. There is no discernible enhancement to suggest underlying mass lesion, though acute blood products could mask enhancement.  06/12/2021 a chest x-ray showed a 4.4 cm left middle lobe. 06/13/2021, CT chest with contrast showed a 4.3 x 3.6 x 3.3 cm lobular spiculated mass in the posterior left upper lobe with T3 to the lateral pleura and major fissure.  Metastatic left hilar lymphadenopathy.  Peripheral  micronodularity posterior right costophrenic sulcus.  Aortic atherosclerosis. 06/14/2021, CT abdomen pelvis showed right lower lobe pulmonary artery embolus.  No evidence of right heart strain.  No acute intra-abdominal or pelvic pathology.  Aortic atherosclerosis.  06/13/2021, patient underwent bronchoscopy with EBUS by Dr. Tamala Julian.  Biopsy from the fine-needle aspiration of station 11 mL showed malignant cells, consistent with poorly differentiated non-small cell carcinoma, consistent with adenocarcinoma.  Malignant cells are TTF-1 positive and negative for p40.  Negative for neuroendocrine markers.  Blood test and tissue sample were tested for Gardant 360  -PD-L1 TPS 97%, no actionable mutation on the blood testing. 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   07/17/2021 Cancer Staging   Staging form: Lung, AJCC 8th Edition - Clinical: Stage IV (cT2b, cN1, cM1) - Signed by [name], MD on 08/06/2021   08/06/2021 Imaging   I saw patient's PET scan after his visit with me on 08/06/21. PET scan was ordered by his previous oncologist group and did not come to my in basket.. Patient received cycle 1 carboplatin Taxol today.  He has started on radiation.  5/26/3 PET scan showed 3.8 cm hypermetabolic left upper lobe mass with hypermetabolic left hilar and infrahilar adenopathy.  There is approximately 8 scattered metastatic lesions in the skeleton. Mixed density photopenic lesion in the left cerebellum. characterized as hemorrhage on recent prior imaging workups.    08/06/2021 - 08/06/2021 Chemotherapy   Patient is on Treatment Plan : LUNG Carboplatin / Paclitaxel + XRT q7d     08/16/2021 -  Chemotherapy   Switched to systemic chemotherapy with  Carboplatin (4.5) + Pemetrexed (400) + Pembrolizumab (200) D1 q21d Induction x 4 cycles      08/22/2021 Imaging   MRI brain w wo contrast  Decrease in size of left cerebellar hematoma with resolution of edema. No evidence of underlying lesion. Smaller,  more recent hemorrhage in the left cerebellar vermis with minimal edema. There is minimal enhancement without definite evidence of underlying lesion.  New punctate focus of chronic blood products and enhancement in the left frontal lobe. Additional new foci of chronic blood products in the posterior right putamen, right parietal subcortical white matter, and posterior right cerebellum. Unclear at this time if these represent foci of bland hemorrhage or early metastases.   Increase in size of right parietal osseous metastasis with minor extraosseous extension.      ALLERGIES:  is allergic to niaspan [niacin].  MEDICATIONS:  Current Outpatient Medications  Medication Sig Dispense Refill   amLODipine (NORVASC) 10 MG tablet TAKE 1 TABLET(10 MG) BY MOUTH DAILY 90 tablet 0   apixaban (ELIQUIS) 2.5 MG TABS tablet Take 1 tablet (2.5 mg total) by mouth 2 (two) times daily. 60 tablet 3   atorvastatin (LIPITOR) 40 MG tablet TAKE 1 TABLET(40 MG) BY MOUTH DAILY 90 tablet 2   collagenase (SANTYL) 250 UNIT/GM ointment Apply 1 Application topically daily. 15 g 0   dexamethasone (DECADRON) 4 MG tablet Take 1 tab every 12 hours the day before pemetrexed chemo, then take 2 tabs once a day for 3 days starting the day after carboplatin. 30 tablet 1   folic acid (FOLVITE) 1 MG tablet Take 1 tablet (1 mg total)  by mouth daily. Start 7 days before pemetrexed chemotherapy. Continue until 21 days after pemetrexed completed. 100 tablet 3   insulin glargine (LANTUS) 100 UNIT/ML Solostar Pen Inject 25 Units into the skin daily. (Patient taking differently: Inject 15-30 Units into the skin daily as needed (high blood sugar).) 15 mL 11   losartan (COZAAR) 50 MG tablet TAKE 1 TABLET(50 MG) BY MOUTH DAILY 90 tablet 0   metoprolol succinate (TOPROL-XL) 25 MG 24 hr tablet Take 0.5 tablets (12.5 mg total) by mouth daily. 30 tablet 0   omeprazole (PRILOSEC OTC) 20 MG tablet Take 20 mg by mouth daily.     pantoprazole (PROTONIX)  40 MG tablet Take 1 tablet (40 mg total) by mouth daily at 6 (six) AM. 90 tablet 0   topiramate (TOPAMAX) 100 MG tablet TAKE 1 TABLET(100 MG) BY MOUTH AT BEDTIME 30 tablet 0   traZODone (DESYREL) 50 MG tablet Take 1 tablet (50 mg total) by mouth at bedtime as needed for sleep. 30 tablet 1   UNABLE TO FIND PLEASE CHECK FASTING BLOOD GLUCOSE EVERY MORNING 1 each 0   oxyCODONE-acetaminophen (PERCOCET) 7.5-325 MG tablet Take 1 tablet by mouth every 4 (four) hours as needed for severe pain. (Patient not taking: Reported on 09/19/2021) 60 tablet 0   No current facility-administered medications for this visit.    VITAL SIGNS: BP (!) 105/57   Pulse 70   Temp 98.9 F (37.2 C) (Tympanic)   Resp 20   Ht $R'5\' 7"'SO$  (1.702 m)   Wt 148 lb 14.4 oz (67.5 kg)   BMI 23.32 kg/m  Filed Weights   09/19/21 0905  Weight: 148 lb 14.4 oz (67.5 kg)    Estimated body mass index is 23.32 kg/m as calculated from the following:   Height as of this encounter: $RemoveBeforeD'5\' 7"'ykXMorUUXvwJHS$  (1.702 m).   Weight as of this encounter: 148 lb 14.4 oz (67.5 kg).  LABS: CBC:    Component Value Date/Time   WBC 3.9 (L) 09/19/2021 0810   HGB 9.1 (L) 09/19/2021 0810   HGB 13.4 07/17/2021 1345   HCT 28.8 (L) 09/19/2021 0810   PLT 147 (L) 09/19/2021 0810   PLT 373 07/17/2021 1345   MCV 98.3 09/19/2021 0810   NEUTROABS 2.4 09/19/2021 0810   LYMPHSABS 1.1 09/19/2021 0810   MONOABS 0.4 09/19/2021 0810   EOSABS 0.0 09/19/2021 0810   BASOSABS 0.0 09/19/2021 0810   Comprehensive Metabolic Panel:    Component Value Date/Time   NA 137 09/19/2021 0810   K 3.7 09/19/2021 0810   CL 108 09/19/2021 0810   CO2 25 09/19/2021 0810   BUN 12 09/19/2021 0810   CREATININE 0.59 (L) 09/19/2021 0810   CREATININE 0.76 07/17/2021 1345   CREATININE 0.89 06/10/2021 1210   GLUCOSE 183 (H) 09/19/2021 0810   CALCIUM 8.4 (L) 09/19/2021 0810   AST 19 09/19/2021 0810   AST 18 07/17/2021 1345   ALT 15 09/19/2021 0810   ALT 20 07/17/2021 1345   ALKPHOS 129 (H)  09/19/2021 0810   BILITOT 0.5 09/19/2021 0810   BILITOT 0.6 07/17/2021 1345   PROT 6.3 (L) 09/19/2021 0810   ALBUMIN 3.4 (L) 09/19/2021 0810    RADIOGRAPHIC STUDIES: MR Brain W Wo Contrast  Result Date: 08/22/2021 CLINICAL DATA:  Brain/CNS neoplasm, monitor 3T SRS Protocol; lung cancer EXAM: MRI HEAD WITHOUT AND WITH CONTRAST TECHNIQUE: Multiplanar, multiecho pulse sequences of the brain and surrounding structures were obtained without and with intravenous contrast. CONTRAST:  1mL GADAVIST GADOBUTROL 1  MMOL/ML IV SOLN COMPARISON:  06/12/2021 FINDINGS: Brain: Decrease in size of left cerebellar hematoma with late subacute and chronic blood products present. Surrounding edema has resolved. Mass effect has nearly resolved. No evidence of underlying lesion. More superiorly within the left cerebellar vermis, there is an additional smaller area of hemorrhage that appears more recent. There is minimal curvilinear enhancement and minimal edema. New focus of susceptibility with enhancement in the inferior left frontal gyrus (series 1200, image 200). New foci of susceptibility in the posterior right putamen, right parietal subcortical white matter, and posterior right cerebellum. There may be minimal associated enhancement in the right cerebellum. No acute infarction. Ventricles and sulci are prominent reflecting mild volume loss. Patchy foci of T2 hyperintensity in the supratentorial white matter nonspecific but may reflect mild chronic microvascular ischemic changes. Vascular: Diminished left vertebral artery flow void as before. Otherwise major vessel flow voids are preserved. Skull and upper cervical spine: Enhancing lesion of the posterior right parietal calvarium with minor extraosseous extension including some epidural involvement. This was present in retrospect and has increased in size. Sinuses/Orbits: Paranasal sinus mucosal thickening. Orbits are unremarkable. Other: Sella is unremarkable.  Mastoid air  cells are clear. IMPRESSION: Decrease in size of left cerebellar hematoma with resolution of edema. No evidence of underlying lesion. Smaller, more recent hemorrhage in the left cerebellar vermis with minimal edema. There is minimal enhancement without definite evidence of underlying lesion. New punctate focus of chronic blood products and enhancement in the left frontal lobe. Additional new foci of chronic blood products in the posterior right putamen, right parietal subcortical white matter, and posterior right cerebellum. Unclear at this time if these represent foci of bland hemorrhage or early metastases. Increase in size of right parietal osseous metastasis with minor extraosseous extension. Electronically Signed   By: Macy Mis M.D.   On: 08/22/2021 12:17    PERFORMANCE STATUS (ECOG) : 2 - Symptomatic, <50% confined to bed  Review of Systems Unless otherwise noted, a complete review of systems is negative.  Physical Exam General: NAD Pulmonary: Unlabored Extremities: no edema, no joint deformities Skin: no rashes Neurological: Weakness but otherwise nonfocal  IMPRESSION: Follow-up visit.    Patient reports that he is doing reasonably well.  He denies significant changes or concerns.  He does continue to endorse severe and persistent hip pain.   Patient has known skeletal metastasis to the right posterior iliac bone/right anterior iliac bone, which is likely the etiology of his pain.  We will send patient for x-ray to rule out pathologic fracture as pain is limiting his mobility.  Will speak with Dr. Tasia Catchings regarding option of referral to radiation oncology as patient is trying to limit opioid utilization.  I did speak with patient regarding option of liberalizing his Percocet that he is concerned about constipating effects.  I encouraged liberalizing his bowel regimen with MiraLAX/Senokot  PLAN: -Continue current scope of treatment -X-ray right hip -Referral for radiation  oncology -Follow-up telephone visit 1 month   Patient expressed understanding and was in agreement with this plan. He also understands that He can call the clinic at any time with any questions, concerns, or complaints.   Time Total: 15 minutes  Visit consisted of counseling and education dealing with the complex and emotionally intense issues of symptom management and palliative care in the setting of serious and potentially life-threatening illness.Greater than 50%  of this time was spent counseling and coordinating care related to the above assessment and plan.  Signed by:  [name], [name], NP-C

## 2021-09-20 ENCOUNTER — Ambulatory Visit: Payer: PPO

## 2021-09-20 ENCOUNTER — Inpatient Hospital Stay: Payer: PPO

## 2021-09-20 ENCOUNTER — Telehealth: Payer: Self-pay

## 2021-09-20 ENCOUNTER — Encounter: Payer: Self-pay | Admitting: Oncology

## 2021-09-20 DIAGNOSIS — Z5111 Encounter for antineoplastic chemotherapy: Secondary | ICD-10-CM | POA: Diagnosis not present

## 2021-09-20 DIAGNOSIS — C3412 Malignant neoplasm of upper lobe, left bronchus or lung: Secondary | ICD-10-CM

## 2021-09-20 MED ORDER — CYANOCOBALAMIN 1000 MCG/ML IJ SOLN
1000.0000 ug | Freq: Once | INTRAMUSCULAR | Status: AC
  Administered 2021-09-20: 1000 ug via INTRAMUSCULAR
  Filled 2021-09-20: qty 1

## 2021-09-20 NOTE — Telephone Encounter (Signed)
-----   Message from [name], MD sent at 09/20/2021 12:03 AM EDT ----- B12 level is low. Recommend patient to get B12 injection this week and also add to next visit.

## 2021-09-20 NOTE — Telephone Encounter (Signed)
Pt informed and verbalized understanding. He will be coming this afternoon for B12 injection. Injection has been added to next week's appt.

## 2021-09-21 ENCOUNTER — Ambulatory Visit: Payer: PPO

## 2021-09-23 ENCOUNTER — Telehealth: Payer: Self-pay | Admitting: *Deleted

## 2021-09-23 ENCOUNTER — Encounter: Payer: Self-pay | Admitting: Radiation Oncology

## 2021-09-23 ENCOUNTER — Ambulatory Visit: Payer: PPO

## 2021-09-23 ENCOUNTER — Ambulatory Visit
Admission: RE | Admit: 2021-09-23 | Discharge: 2021-09-23 | Disposition: A | Discharge status: Home / Self Care | Payer: PPO | Source: Ambulatory Visit | Attending: Radiation Oncology | Admitting: Radiation Oncology

## 2021-09-23 VITALS — BP 112/62 | HR 85 | Temp 98.5°F | Resp 16 | Ht 67.0 in | Wt 153.2 lb

## 2021-09-23 DIAGNOSIS — C3412 Malignant neoplasm of upper lobe, left bronchus or lung: Secondary | ICD-10-CM | POA: Insufficient documentation

## 2021-09-23 DIAGNOSIS — C7951 Secondary malignant neoplasm of bone: Secondary | ICD-10-CM | POA: Insufficient documentation

## 2021-09-23 DIAGNOSIS — Z923 Personal history of irradiation: Secondary | ICD-10-CM | POA: Insufficient documentation

## 2021-09-23 DIAGNOSIS — M25551 Pain in right hip: Secondary | ICD-10-CM | POA: Diagnosis not present

## 2021-09-23 NOTE — Telephone Encounter (Signed)
Daughter called asking about the dental clearance that is needed from our office She is asking if there is a form for dentist to fill out that she can oick up today, Please return her call regarding this

## 2021-09-23 NOTE — Progress Notes (Signed)
Radiation Oncology Follow up Note old patient new area bone mets  Name: Micheal Donovan   Date:   09/23/2021 MRN:  [id number] DOB: Aug 06, 1950    This 71 y.o. male presents to the clinic today for evaluation of bone metastasis and patient with known stage IV.  Adenocarcinoma the left lung.  REFERRING PROVIDER: No ref. provider found  HPI: Patient was originally consulted back in May prior to MRI scan when he was found to have stage IV adenocarcinoma of the left lung.  He has been undergoing chemotherapy.  With carboplatin Alimta and Keytruda.  He continues to have narcotic dependent pain in his mostly his right hip although PET CT scan shows involvement of L1-L2 L5 SI joints and right iliac crest hypermetabolic lesions consistent with metastatic disease.  He is having extreme right hip pain.  He is ambulating with some difficulty and has narcotics for pain relief.  He is referred to ration collagen for consideration of palliative treatment through palliative care.  His breathing status is fine specifically denies hemoptysis significant cough or chest tightness.  COMPLICATIONS OF TREATMENT: none  FOLLOW UP COMPLIANCE: keeps appointments   PHYSICAL EXAM:  BP 112/62 (BP Location: Left Arm, Patient Position: Sitting, Cuff Size: Normal)   Pulse 85   Temp 98.5 F (36.9 C) (Tympanic)   Resp 16   Ht 5\' 7"  (1.702 m) Comment: stated HT  Wt 153 lb 3.2 oz (69.5 kg)   BMI 23.99 kg/m  Range of motion his lower extremities does not elicit pain.  Motor and sensory levels are equal and symmetric in the lower extremities.  Well-developed well-nourished patient in NAD. HEENT reveals PERLA, EOMI, discs not visualized.  Oral cavity is clear. No oral mucosal lesions are identified. Neck is clear without evidence of cervical or supraclavicular adenopathy. Lungs are clear to A&P. Cardiac examination is essentially unremarkable with regular rate and rhythm without murmur rub or thrill. Abdomen is benign with no  organomegaly or masses noted. Motor sensory and DTR levels are equal and symmetric in the upper and lower extremities. Cranial nerves II through XII are grossly intact. Proprioception is intact. No peripheral adenopathy or edema is identified. No motor or sensory levels are noted. Crude visual fields are within normal range.  RADIOLOGY RESULTS: PET CT scan reviewed compatible with above-stated findings  PLAN: This time like to go with palliative radiation therapy to the right iliac crest lesion as well as his L-spine and SI joints.  We will plan on delivering 30 Gray in 10 fractions to those regions.  May be able to accommodate all lesions in a single field although there may be 2 separate isodose is for our treatment planning.  Risks and benefits of treatment including possible development of diarrhea fatigue alteration of blood count skin reaction and increased lower urinary tract symptoms were reviewed with the patient.  He seems to comprehend my treatment plan well.  I have personally set up and ordered CT simulation for later this week.  I would like to take this opportunity to thank you for allowing me to participate in the care of your patient.Noreene Filbert, MD

## 2021-09-23 NOTE — Telephone Encounter (Signed)
Form given to [facility]

## 2021-09-24 ENCOUNTER — Ambulatory Visit: Payer: PPO

## 2021-09-25 ENCOUNTER — Telehealth: Payer: Self-pay | Admitting: Nurse Practitioner

## 2021-09-25 ENCOUNTER — Ambulatory Visit: Payer: PPO

## 2021-09-25 ENCOUNTER — Ambulatory Visit
Admission: RE | Admit: 2021-09-25 | Discharge: 2021-09-25 | Disposition: A | Discharge status: Home / Self Care | Payer: PPO | Source: Ambulatory Visit | Attending: Radiation Oncology | Admitting: Radiation Oncology

## 2021-09-25 DIAGNOSIS — C3412 Malignant neoplasm of upper lobe, left bronchus or lung: Secondary | ICD-10-CM | POA: Diagnosis not present

## 2021-09-25 DIAGNOSIS — Z510 Encounter for antineoplastic radiation therapy: Secondary | ICD-10-CM | POA: Diagnosis not present

## 2021-09-25 DIAGNOSIS — C7951 Secondary malignant neoplasm of bone: Secondary | ICD-10-CM | POA: Diagnosis not present

## 2021-09-25 NOTE — Telephone Encounter (Signed)
Spoke with patient and discussed the Palliative referral/services with him and explained what we do and he has declined in-home Palliative services at this time.  Told patient that I would cancel the referral and notify the [place] and he was in agreement with this.

## 2021-09-26 ENCOUNTER — Ambulatory Visit: Payer: PPO

## 2021-09-26 DIAGNOSIS — C3412 Malignant neoplasm of upper lobe, left bronchus or lung: Secondary | ICD-10-CM | POA: Diagnosis not present

## 2021-09-26 DIAGNOSIS — C7951 Secondary malignant neoplasm of bone: Secondary | ICD-10-CM | POA: Diagnosis not present

## 2021-09-26 DIAGNOSIS — Z510 Encounter for antineoplastic radiation therapy: Secondary | ICD-10-CM | POA: Diagnosis not present

## 2021-09-26 MED FILL — Fosaprepitant Dimeglumine For IV Infusion 150 MG (Base Eq): INTRAVENOUS | Qty: 5 | Status: AC

## 2021-09-26 MED FILL — Dexamethasone Sodium Phosphate Inj 100 MG/10ML: INTRAMUSCULAR | Qty: 1 | Status: AC

## 2021-09-27 ENCOUNTER — Inpatient Hospital Stay: Payer: PPO

## 2021-09-27 ENCOUNTER — Inpatient Hospital Stay (HOSPITAL_BASED_OUTPATIENT_CLINIC_OR_DEPARTMENT_OTHER): Payer: PPO | Admitting: Oncology

## 2021-09-27 ENCOUNTER — Ambulatory Visit: Payer: PPO

## 2021-09-27 ENCOUNTER — Encounter: Payer: Self-pay | Admitting: Oncology

## 2021-09-27 VITALS — BP 143/71 | HR 70 | Temp 95.5°F | Ht 67.0 in | Wt 150.7 lb

## 2021-09-27 DIAGNOSIS — D6481 Anemia due to antineoplastic chemotherapy: Secondary | ICD-10-CM | POA: Diagnosis not present

## 2021-09-27 DIAGNOSIS — T451X5A Adverse effect of antineoplastic and immunosuppressive drugs, initial encounter: Secondary | ICD-10-CM

## 2021-09-27 DIAGNOSIS — I619 Nontraumatic intracerebral hemorrhage, unspecified: Secondary | ICD-10-CM | POA: Diagnosis not present

## 2021-09-27 DIAGNOSIS — I2699 Other pulmonary embolism without acute cor pulmonale: Secondary | ICD-10-CM

## 2021-09-27 DIAGNOSIS — Z5111 Encounter for antineoplastic chemotherapy: Secondary | ICD-10-CM

## 2021-09-27 DIAGNOSIS — D518 Other vitamin B12 deficiency anemias: Secondary | ICD-10-CM

## 2021-09-27 DIAGNOSIS — G893 Neoplasm related pain (acute) (chronic): Secondary | ICD-10-CM | POA: Diagnosis not present

## 2021-09-27 DIAGNOSIS — C7951 Secondary malignant neoplasm of bone: Secondary | ICD-10-CM

## 2021-09-27 DIAGNOSIS — C3412 Malignant neoplasm of upper lobe, left bronchus or lung: Secondary | ICD-10-CM | POA: Diagnosis not present

## 2021-09-27 DIAGNOSIS — D519 Vitamin B12 deficiency anemia, unspecified: Secondary | ICD-10-CM | POA: Insufficient documentation

## 2021-09-27 LAB — CBC WITH DIFFERENTIAL/PLATELET
Abs Immature Granulocytes: 0.07 10*3/uL (ref 0.00–0.07)
Basophils Absolute: 0 10*3/uL (ref 0.0–0.1)
Basophils Relative: 0 %
Eosinophils Absolute: 0 10*3/uL (ref 0.0–0.5)
Eosinophils Relative: 0 %
HCT: 29.7 % — ABNORMAL LOW (ref 39.0–52.0)
Hemoglobin: 9.7 g/dL — ABNORMAL LOW (ref 13.0–17.0)
Immature Granulocytes: 1 %
Lymphocytes Relative: 19 %
Lymphs Abs: 1.3 10*3/uL (ref 0.7–4.0)
MCH: 32.1 pg (ref 26.0–34.0)
MCHC: 32.7 g/dL (ref 30.0–36.0)
MCV: 98.3 fL (ref 80.0–100.0)
Monocytes Absolute: 0.7 10*3/uL (ref 0.1–1.0)
Monocytes Relative: 9 %
Neutro Abs: 4.9 10*3/uL (ref 1.7–7.7)
Neutrophils Relative %: 71 %
Platelets: 445 10*3/uL — ABNORMAL HIGH (ref 150–400)
RBC: 3.02 MIL/uL — ABNORMAL LOW (ref 4.22–5.81)
RDW: 17.9 % — ABNORMAL HIGH (ref 11.5–15.5)
WBC: 6.9 10*3/uL (ref 4.0–10.5)
nRBC: 0 % (ref 0.0–0.2)

## 2021-09-27 LAB — COMPREHENSIVE METABOLIC PANEL
ALT: 32 U/L (ref 0–44)
AST: 24 U/L (ref 15–41)
Albumin: 3.9 g/dL (ref 3.5–5.0)
Alkaline Phosphatase: 127 U/L — ABNORMAL HIGH (ref 38–126)
Anion gap: 9 (ref 5–15)
BUN: 17 mg/dL (ref 8–23)
CO2: 23 mmol/L (ref 22–32)
Calcium: 9.8 mg/dL (ref 8.9–10.3)
Chloride: 103 mmol/L (ref 98–111)
Creatinine, Ser: 0.77 mg/dL (ref 0.61–1.24)
GFR, Estimated: >60 mL/min (ref >60)
Glucose, Bld: 228 mg/dL — ABNORMAL HIGH (ref 70–99)
Potassium: 4.3 mmol/L (ref 3.5–5.1)
Sodium: 135 mmol/L (ref 135–145)
Total Bilirubin: 0.5 mg/dL (ref 0.3–1.2)
Total Protein: 7.2 g/dL (ref 6.5–8.1)

## 2021-09-27 LAB — T4, FREE: Free T4: 0.64 ng/dL (ref 0.61–1.12)

## 2021-09-27 LAB — TSH: TSH: 0.612 u[IU]/mL (ref 0.350–4.500)

## 2021-09-27 MED ORDER — PALONOSETRON HCL INJECTION 0.25 MG/5ML
0.2500 mg | Freq: Once | INTRAVENOUS | Status: AC
  Administered 2021-09-27: 0.25 mg via INTRAVENOUS
  Filled 2021-09-27: qty 5

## 2021-09-27 MED ORDER — SODIUM CHLORIDE 0.9 % IV SOLN
200.0000 mg | Freq: Once | INTRAVENOUS | Status: AC
  Administered 2021-09-27: 200 mg via INTRAVENOUS
  Filled 2021-09-27: qty 8

## 2021-09-27 MED ORDER — SODIUM CHLORIDE 0.9 % IV SOLN
400.0000 mg/m2 | Freq: Once | INTRAVENOUS | Status: AC
  Administered 2021-09-27: 700 mg via INTRAVENOUS
  Filled 2021-09-27: qty 20

## 2021-09-27 MED ORDER — CYANOCOBALAMIN 1000 MCG/ML IJ SOLN
1000.0000 ug | Freq: Once | INTRAMUSCULAR | Status: AC
  Administered 2021-09-27: 1000 ug via INTRAMUSCULAR
  Filled 2021-09-27: qty 1

## 2021-09-27 MED ORDER — HEPARIN SOD (PORK) LOCK FLUSH 100 UNIT/ML IV SOLN
500.0000 [IU] | Freq: Once | INTRAVENOUS | Status: AC | PRN
  Filled 2021-09-27: qty 5

## 2021-09-27 MED ORDER — SODIUM CHLORIDE 0.9 % IV SOLN
150.0000 mg | Freq: Once | INTRAVENOUS | Status: AC
  Administered 2021-09-27: 150 mg via INTRAVENOUS
  Filled 2021-09-27: qty 150

## 2021-09-27 MED ORDER — SODIUM CHLORIDE 0.9 % IV SOLN
390.0000 mg | Freq: Once | INTRAVENOUS | Status: AC
  Administered 2021-09-27: 390 mg via INTRAVENOUS
  Filled 2021-09-27: qty 39

## 2021-09-27 MED ORDER — SODIUM CHLORIDE 0.9 % IV SOLN
Freq: Once | INTRAVENOUS | Status: AC
  Filled 2021-09-27: qty 250

## 2021-09-27 MED ORDER — HEPARIN SOD (PORK) LOCK FLUSH 100 UNIT/ML IV SOLN
INTRAVENOUS | Status: AC
  Administered 2021-09-27: 500 [IU]
  Filled 2021-09-27: qty 5

## 2021-09-27 MED ORDER — SODIUM CHLORIDE 0.9 % IV SOLN
10.0000 mg | Freq: Once | INTRAVENOUS | Status: AC
  Administered 2021-09-27: 10 mg via INTRAVENOUS
  Filled 2021-09-27: qty 10

## 2021-09-27 MED ORDER — SODIUM CHLORIDE 0.9% FLUSH
10.0000 mL | INTRAVENOUS | Status: DC | PRN
  Administered 2021-09-27: 10 mL via INTRAVENOUS
  Filled 2021-09-27: qty 10

## 2021-09-27 NOTE — Assessment & Plan Note (Signed)
Recommend B12 injection weekly x 4 followed by monthly.

## 2021-09-27 NOTE — Progress Notes (Signed)
Nutrition Follow-up:  Patient with non-small cell lung cancer.  Patient receiving radiation and chemotherapy.    Met with patient during infusion.  Patient reports that appetite is great.  "I ate chicken salad and crackers at 11pm last night.  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   Wife is back home per patient with caregiver 12 hours per day.    Medications: reviewed  Labs: reviewed  Anthropometrics:   Weight 150 lb 11.2 oz today  140 lb on 6/23 134 lb on 6/1 143 lb on 4/20   NUTRITION DIAGNOSIS: Inadequate oral intake improving   INTERVENTION:  Patient to continue eating well balanced diet including good sources of lean protein    MONITORING, EVALUATION, GOAL: weight trends, intake   NEXT VISIT: as needed  Micheal Donovan, [place], [place] Registered Dietitian [phone]

## 2021-09-27 NOTE — Assessment & Plan Note (Signed)
Hemoglobin is slight decreased. Monitor.

## 2021-09-27 NOTE — Patient Instructions (Signed)
[facility] CANCER CTR AT [place]  Discharge Instructions: Thank you for choosing [facility] to provide your oncology and hematology care.  If you have a lab appointment with the [facility], please go directly to the [place] and check in at the registration area.  Wear comfortable clothing and clothing appropriate for easy access to any Portacath or PICC line.   We strive to give you quality time with your provider. You may need to reschedule your appointment if you arrive late (15 or more minutes).  Arriving late affects you and other patients whose appointments are after yours.  Also, if you miss three or more appointments without notifying the office, you may be dismissed from the clinic at the provider's discretion.      For prescription refill requests, have your pharmacy contact our office and allow 72 hours for refills to be completed.    Today you received the following chemotherapy and/or immunotherapy agents Keytruda, alimta, carboplatin    To help prevent nausea and vomiting after your treatment, we encourage you to take your nausea medication as directed.  BELOW ARE SYMPTOMS THAT SHOULD BE REPORTED IMMEDIATELY: *FEVER GREATER THAN 100.4 F (38 C) OR HIGHER *CHILLS OR SWEATING *NAUSEA AND VOMITING THAT IS NOT CONTROLLED WITH YOUR NAUSEA MEDICATION *UNUSUAL SHORTNESS OF BREATH *UNUSUAL BRUISING OR BLEEDING *URINARY PROBLEMS (pain or burning when urinating, or frequent urination) *BOWEL PROBLEMS (unusual diarrhea, constipation, pain near the anus) TENDERNESS IN MOUTH AND THROAT WITH OR WITHOUT PRESENCE OF ULCERS (sore throat, sores in mouth, or a toothache) UNUSUAL RASH, SWELLING OR PAIN  UNUSUAL VAGINAL DISCHARGE OR ITCHING   Items with * indicate a potential emergency and should be followed up as soon as possible or go to the Emergency Department if any problems should occur.  Please show the CHEMOTHERAPY ALERT CARD or IMMUNOTHERAPY ALERT  CARD at check-in to the Emergency Department and triage nurse.  Should you have questions after your visit or need to cancel or reschedule your appointment, please contact [facility] CANCER [place] AT [place]  [phone] and follow the prompts.  Office hours are 8:00 a.m. to 4:30 p.m. Monday - Friday. Please note that voicemails left after 4:00 p.m. may not be returned until the following business day.  We are closed weekends and major holidays. You have access to a nurse at all times for urgent questions. Please call the main number to the clinic [phone] and follow the prompts.  For any non-urgent questions, you may also contact your provider using MyChart. We now offer e-Visits for anyone 74 and older to request care online for non-urgent symptoms. For details visit [email or web address].   Also download the MyChart app! Go to the app store, search "MyChart", open the app, select [facility], and log in with your MyChart username and password.  Masks are optional in the cancer centers. If you would like for your care team to wear a mask while they are taking care of you, please let them know. For doctor visits, patients may have with them one support person who is at least 71 years old. At this time, visitors are not allowed in the infusion area.

## 2021-09-27 NOTE — Assessment & Plan Note (Signed)
Eliquis 2.5mg  BID.

## 2021-09-27 NOTE — Assessment & Plan Note (Addendum)
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  Labs are reviewed and discussed with patient. Proceed with carboplatin/Alimta/Keytruda today.

## 2021-09-27 NOTE — Assessment & Plan Note (Signed)
Percocet PRN. He rarely utilize it.

## 2021-09-27 NOTE — Assessment & Plan Note (Signed)
Eliquis is decreased to 2.5mg  BID.  Repeat MRI brain in 2 months.

## 2021-09-27 NOTE — Progress Notes (Signed)
Hematology/Oncology Progress note [phone] [phone]            Patient Care Team: Default, Provider, MD as PCP - General [name], RN as Oncology Nurse Navigator   ASSESSMENT & PLAN:   Cancer Staging  Primary non-small cell carcinoma of upper lobe of left lung ([place]) Staging form: Lung, AJCC 8th Edition - Clinical: Stage IV (cT2b, cN1, cM1) - Signed by [name], MD on 08/06/2021   Primary non-small cell carcinoma of upper lobe of left lung ([place]) Stage IV lung adenocarcinoma with brain and bone metastasis.  Labs are reviewed and discussed with patient. Proceed with carboplatin/Alimta/Keytruda today.   Encounter for antineoplastic chemotherapy Chemotherapy plan as listed above  Neoplasm related pain Percocet PRN. Micheal Donovan rarely utilize it.   Intraparenchymal hemorrhage of brain (HCC) Eliquis is decreased to 2.$RemoveBefore'5mg'pBtVYMDQKVxyY$  BID.  Repeat MRI brain in 2 months.   Metastasis to bone (HCC) Recommend bisphosphonate.  Rational and side effects were discussed.  Recommend Zometa monthly, add to next visit.  Recommend calcium supplementation.   Anemia due to antineoplastic chemotherapy Hemoglobin is slight decreased. Monitor.   Pulmonary embolus (HCC) Eliquis 2.$RemoveBeforeD'5mg'UqAdforplbWKff$  BID.   B12 deficiency anemia Recommend B12 injection weekly x 4 followed by monthly.  Orders Placed This Encounter  Procedures   NM PET Image Restag (PS) Skull Base To Thigh    Standing Status:   Future    Standing Expiration Date:   09/27/2022    Order Specific Question:   If indicated for the ordered procedure, I authorize the administration of a radiopharmaceutical per Radiology protocol    Answer:   Yes    Order Specific Question:   Preferred imaging location?    Answer:   [facility]    Treatment today.  Lab MD carboplatin Alimta [name]. 3 weeks  All questions were answered. The patient knows to call the clinic with any problems, questions or concerns.  [name], MD, [name] [facility]  Health Hematology Oncology 09/27/2021      CHIEF COMPLAINTS/REASON FOR VISIT:  non-small cell lung cancer  HISTORY OF PRESENTING ILLNESS:   Micheal Donovan is a  71 y.o.  male presents for follow up of Non-small cell lung cancer.  Oncology History Overview Note  Diagnosis: Stage IIB T2b N1 M0 adenocarcinoma of the LUL, poorly differentiated     Primary non-small cell carcinoma of upper lobe of left lung ([place])  06/13/2021 Initial Diagnosis   Primary non-small cell carcinoma of upper lobe of left lung [facility]) -06/20/2021 - 06/27/2021, patient presented to [facility] due to progressive headache/dizziness/gait changes. CT head showed acute to subacute intraparenchymal hemorrhage involving the left cerebellum.  Surrounding low-density vasogenic edema.  Patient was transferred to [facility]. This was further evaluated by CT angiogram of the neck which showed bulky calcified plaques/stenosis of carotid artery, Followed by MRI brain. 06/12/2021, MRI of the brain showed no significant interval change in size of the left cerebellar intraparenchymal hematoma with unchanged regional mass effect and partial effacement of fourth ventricle but no upstream hydrocephalus. There is no discernible enhancement to suggest underlying mass lesion, though acute blood products could mask enhancement.   06/12/2021 a chest x-ray showed a 4.4 cm left middle lobe. 06/13/2021, CT chest with contrast showed a 4.3 x 3.6 x 3.3 cm lobular spiculated mass in the posterior left upper lobe with T3 to the lateral pleura and major fissure.  Metastatic left hilar lymphadenopathy.  Peripheral micronodularity posterior right costophrenic sulcus.  Aortic atherosclerosis. 06/14/2021,  CT abdomen pelvis showed right lower lobe pulmonary artery embolus.  No evidence of right heart strain.  No acute intra-abdominal or pelvic pathology.  Aortic atherosclerosis.  06/13/2021, patient underwent bronchoscopy with EBUS by Dr. Tamala Julian.  Biopsy  from the fine-needle aspiration of station 11 mL showed malignant cells, consistent with poorly differentiated non-small cell carcinoma, consistent with adenocarcinoma.  Malignant cells are TTF-1 positive and negative for p40.  Negative for neuroendocrine markers.  Blood test and tissue sample were tested for Gardant 360  -PD-L1 TPS 97%, no actionable mutation on the blood testing. 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   07/17/2021 Cancer Staging   Staging form: Lung, AJCC 8th Edition - Clinical: Stage IV (cT2b, cN1, cM1) - Signed by [name], MD on 08/06/2021   08/06/2021 Imaging   I saw patient's PET scan after his visit with me on 08/06/21. PET scan was ordered by his previous oncologist group and did not come to my in basket.. Patient received cycle 1 carboplatin Taxol today.  Micheal Donovan has started on radiation.  5/26/3 PET scan showed 3.8 cm hypermetabolic left upper lobe mass with hypermetabolic left hilar and infrahilar adenopathy.  There is approximately 8 scattered metastatic lesions in the skeleton. Mixed density photopenic lesion in the left cerebellum. characterized as hemorrhage on recent prior imaging workups.    08/06/2021 - 08/06/2021 Chemotherapy   Patient is on Treatment Plan : LUNG Carboplatin / Paclitaxel + XRT q7d     08/16/2021 -  Chemotherapy   Switched to systemic chemotherapy with  Carboplatin (4.5) + Pemetrexed (400) + Pembrolizumab (200) D1 q21d Induction x 4 cycles      08/22/2021 Imaging   MRI brain w wo contrast  Decrease in size of left cerebellar hematoma with resolution of edema. No evidence of underlying lesion. Smaller, more recent hemorrhage in the left cerebellar vermis with minimal edema. There is minimal enhancement without definite evidence of underlying lesion.  New punctate focus of chronic blood products and enhancement in the left frontal lobe. Additional new foci of chronic blood products in the posterior right putamen, right parietal subcortical  white matter, and posterior right cerebellum. Unclear at this time if these represent foci of bland hemorrhage or early metastases.   Increase in size of right parietal osseous metastasis with minor extraosseous extension.    # Patient has a history of melanoma on his neck, treated in 2009. # History of hemorrhagic infarct left cerebellum  # Pulmonary embolism, SOB is stable, not worse.  Micheal Donovan tolerates chemotherapy. + fatigue , no nausea vomiting diarrhea.   Review of Systems  Constitutional:  Positive for fatigue. Negative for appetite change, chills, fever and unexpected weight change.  HENT:   Negative for hearing loss and voice change.   Eyes:  Negative for eye problems and icterus.  Respiratory:  Positive for shortness of breath. Negative for chest tightness and cough.   Cardiovascular:  Negative for chest pain and leg swelling.  Gastrointestinal:  Negative for abdominal distention, abdominal pain and blood in stool.  Endocrine: Negative for hot flashes.  Genitourinary:  Negative for difficulty urinating, dysuria and frequency.   Musculoskeletal:  Negative for arthralgias.  Skin:  Negative for itching and rash.  Neurological:  Positive for headaches. Negative for extremity weakness, light-headedness and numbness.  Hematological:  Negative for adenopathy. Does not bruise/bleed easily.  Psychiatric/Behavioral:  Negative for confusion.     MEDICAL HISTORY:  Past Medical History:  Diagnosis Date   Allergy    BPH (  benign prostatic hypertrophy)    Cancer (HCC)    Melanoma on Neck    2008   Carotid artery occlusion    Diabetes mellitus    type 2   ED (erectile dysfunction)    GERD (gastroesophageal reflux disease)    Hemorrhagic stroke ([place]) 06/2021   Hyperlipidemia    Hypertension    Lung cancer ([place])    Retinopathy due to secondary DM ([place])     SURGICAL HISTORY: Past Surgical History:  Procedure Laterality Date   BRONCHIAL NEEDLE ASPIRATION BIOPSY  06/17/2021    Procedure: BRONCHIAL NEEDLE ASPIRATION BIOPSIES;  Surgeon: Candee Furbish, MD;  [facility] END[facility]COPY;  Service: Pulmonary;;   IR IMAGING GUIDED PORT INSERTION  08/05/2021   MELANOMA EXCISION  2008   Left side of neck   RADIOLOGY WITH ANESTHESIA N/A 04/05/2020   Procedure: MRI SPINE WITOUT CONTRAST;  Surgeon: Radiologist, Medication, MD;  Location: [place];  Service: Radiology;  Laterality: N/A;   RADIOLOGY WITH ANESTHESIA N/A 08/22/2021   Procedure: MRI BRAIN WITH AND WITHOUT CONTRAST  WITH ANESTHESIA;  Surgeon: Radiologist, Medication, MD;  Location: [place];  Service: Radiology;  Laterality: N/A;   TONSILLECTOMY     VIDEO BRONCHOSCOPY WITH ENDOBRONCHIAL ULTRASOUND N/A 06/17/2021   Procedure: VIDEO BRONCHOSCOPY WITH ENDOBRONCHIAL ULTRASOUND;  Surgeon: Candee Furbish, MD;  Location: [facility] ENDOSCOPY;  Service: Pulmonary;  Laterality: N/A;    SOCIAL HISTORY: Social History   Socioeconomic History   Marital status: Married    Spouse name: Not on file   Number of children: Not on file   Years of education: Not on file   Highest education level: Not on file  Occupational History   Not on file  Tobacco Use   Smoking status: Former    Types: Cigarettes    Quit date: 07/21/1990    Years since quitting: 31.2   Smokeless tobacco: Never  Vaping Use   Vaping Use: Never used  Substance and Sexual Activity   Alcohol use: No   Drug use: No   Sexual activity: Not on file  Other Topics Concern   Not on file  Social History Narrative   Not on file   Social Determinants of Health   Financial Resource Strain: Low Risk  (07/31/2021)   Overall Financial Resource Strain (CARDIA)    Difficulty of Paying Living Expenses: Not very hard  Food Insecurity: No Food Insecurity (07/31/2021)   Hunger Vital Sign    Worried About Running Out of Food in the Last Year: Never true    Ran Out of Food in the Last Year: Never true  Transportation Needs: No Transportation Needs (07/31/2021)   PRAPARE - Armed forces logistics/support/administrative officer (Medical): No    Lack of Transportation (Non-Medical): No  Physical Activity: Inactive (07/31/2021)   Exercise Vital Sign    Days of Exercise per Week: 0 days    Minutes of Exercise per Session: 0 min  Stress: Unknown (07/31/2021)   [place]    Feeling of Stress : Patient refused  Social Connections: Unknown (07/31/2021)   Social Connection and Isolation Panel [NHANES]    Frequency of Communication with Friends and Family: Three times a week    Frequency of Social Gatherings with Friends and Family: Three times a week    Attends Religious Services: Patient refused    Active Member of Clubs or Organizations: Patient refused    Attends Archivist Meetings: Patient  refused    Marital Status: Married  Human resources officer Violence: Not At Risk (07/31/2021)   Humiliation, Afraid, Rape, and Kick questionnaire    Fear of Current or Ex-Partner: No    Emotionally Abused: No    Physically Abused: No    Sexually Abused: No    FAMILY HISTORY: Family History  Problem Relation Age of Onset   COPD Mother    Heart disease Father    Heart disease Brother        MI at age 22   Stroke Neg Hx     ALLERGIES:  is allergic to niaspan [niacin].  MEDICATIONS:  Current Outpatient Medications  Medication Sig Dispense Refill   amLODipine (NORVASC) 10 MG tablet TAKE 1 TABLET(10 MG) BY MOUTH DAILY 90 tablet 0   apixaban (ELIQUIS) 2.5 MG TABS tablet Take 1 tablet (2.5 mg total) by mouth 2 (two) times daily. 60 tablet 3   atorvastatin (LIPITOR) 40 MG tablet TAKE 1 TABLET(40 MG) BY MOUTH DAILY 90 tablet 2   collagenase (SANTYL) 250 UNIT/GM ointment Apply 1 Application topically daily. 15 g 0   dexamethasone (DECADRON) 4 MG tablet Take 1 tab every 12 hours the day before pemetrexed chemo, then take 2 tabs once a day for 3 days starting the day after carboplatin. 30 tablet 1   folic acid (FOLVITE) 1 MG tablet Take  1 tablet (1 mg total) by mouth daily. Start 7 days before pemetrexed chemotherapy. Continue until 21 days after pemetrexed completed. 100 tablet 3   insulin glargine (LANTUS) 100 UNIT/ML Solostar Pen Inject 25 Units into the skin daily. (Patient taking differently: Inject 15-30 Units into the skin daily as needed (high blood sugar).) 15 mL 11   losartan (COZAAR) 50 MG tablet TAKE 1 TABLET(50 MG) BY MOUTH DAILY 90 tablet 0   metoprolol succinate (TOPROL-XL) 25 MG 24 hr tablet Take 0.5 tablets (12.5 mg total) by mouth daily. 30 tablet 0   omeprazole (PRILOSEC OTC) 20 MG tablet Take 20 mg by mouth daily.     oxyCODONE-acetaminophen (PERCOCET) 7.5-325 MG tablet Take 1 tablet by mouth every 4 (four) hours as needed for severe pain. 60 tablet 0   pantoprazole (PROTONIX) 40 MG tablet Take 1 tablet (40 mg total) by mouth daily at 6 (six) AM. 90 tablet 0   topiramate (TOPAMAX) 100 MG tablet TAKE 1 TABLET(100 MG) BY MOUTH AT BEDTIME 30 tablet 0   traZODone (DESYREL) 50 MG tablet Take 1 tablet (50 mg total) by mouth at bedtime as needed for sleep. 30 tablet 1   UNABLE TO FIND PLEASE CHECK FASTING BLOOD GLUCOSE EVERY MORNING 1 each 0   No current facility-administered medications for this visit.     PHYSICAL EXAMINATION:  Vitals:   09/27/21 0829  BP: (!) 143/71  Pulse: 70  Temp: (!) 95.5 F (35.3 C)  SpO2: 100%   Filed Weights   09/27/21 0829  Weight: 150 lb 11.2 oz (68.4 kg)    Physical Exam Constitutional:      General: Micheal Donovan is not in acute distress.    Comments: Patient sits in the wheelchair  HENT:     Head: Normocephalic and atraumatic.  Eyes:     General: No scleral icterus. Cardiovascular:     Rate and Rhythm: Normal rate and regular rhythm.     Heart sounds: Normal heart sounds.  Pulmonary:     Effort: Pulmonary effort is normal. No respiratory distress.     Breath sounds: No wheezing.  Comments: Decreased breath sound bilaterally. Abdominal:     General: Bowel sounds are  normal. There is no distension.     Palpations: Abdomen is soft.  Musculoskeletal:        General: No deformity. Normal range of motion.     Cervical back: Normal range of motion and neck supple.  Skin:    General: Skin is warm and dry.     Findings: No erythema or rash.  Neurological:     Mental Status: Micheal Donovan is alert and oriented to person, place, and time. Mental status is at baseline.     Cranial Nerves: No cranial nerve deficit.     Coordination: Coordination normal.  Psychiatric:        Mood and Affect: Mood normal.     LABORATORY DATA:  I have reviewed the data as listed Lab Results  Component Value Date   WBC 6.9 09/27/2021   HGB 9.7 (L) 09/27/2021   HCT 29.7 (L) 09/27/2021   MCV 98.3 09/27/2021   PLT 445 (H) 09/27/2021   Recent Labs    09/06/21 0807 09/19/21 0810 09/27/21 0816  NA 130* 137 135  K 4.2 3.7 4.3  CL 100 108 103  CO2 _0 GLUCOSE 152* 183* 228*  BUN _1 CREATININE 0.60* 0.59* 0.77  CALCIUM 8.9 8.4* 9.8  GFRNONAA >60 >60 >60  PROT 7.0 6.3* 7.2  ALBUMIN 3.5 3.4* 3.9  AST 12* 19 24  ALT 12 15 32  ALKPHOS 127* 129* 127*  BILITOT 0.4 0.5 0.5    Iron/TIBC/Ferritin/ %Sat    Component Value Date/Time   IRON 50 09/19/2021 0810   TIBC 305 09/19/2021 0810   FERRITIN 249 09/19/2021 0810   IRONPCTSAT 16 (L) 09/19/2021 0810      RADIOGRAPHIC STUDIES: I have personally reviewed the radiological images as listed and agreed with the findings in the report. No results found.

## 2021-09-27 NOTE — Assessment & Plan Note (Signed)
Chemotherapy plan as listed above 

## 2021-09-27 NOTE — Assessment & Plan Note (Signed)
Recommend bisphosphonate.  Rational and side effects were discussed.  Recommend Zometa monthly, add to next visit.  Recommend calcium supplementation.

## 2021-09-30 ENCOUNTER — Ambulatory Visit: Admission: RE | Admit: 2021-09-30 | Payer: PPO | Source: Ambulatory Visit

## 2021-09-30 ENCOUNTER — Ambulatory Visit: Payer: PPO

## 2021-09-30 DIAGNOSIS — Z510 Encounter for antineoplastic radiation therapy: Secondary | ICD-10-CM | POA: Diagnosis not present

## 2021-09-30 DIAGNOSIS — C3412 Malignant neoplasm of upper lobe, left bronchus or lung: Secondary | ICD-10-CM | POA: Diagnosis not present

## 2021-09-30 DIAGNOSIS — C7951 Secondary malignant neoplasm of bone: Secondary | ICD-10-CM | POA: Diagnosis not present

## 2021-10-01 ENCOUNTER — Other Ambulatory Visit: Payer: Self-pay

## 2021-10-01 ENCOUNTER — Ambulatory Visit: Payer: PPO

## 2021-10-01 ENCOUNTER — Ambulatory Visit
Admission: RE | Admit: 2021-10-01 | Discharge: 2021-10-01 | Disposition: A | Discharge status: Home / Self Care | Payer: PPO | Source: Ambulatory Visit | Attending: Radiation Oncology | Admitting: Radiation Oncology

## 2021-10-01 DIAGNOSIS — C3412 Malignant neoplasm of upper lobe, left bronchus or lung: Secondary | ICD-10-CM | POA: Insufficient documentation

## 2021-10-01 DIAGNOSIS — Z510 Encounter for antineoplastic radiation therapy: Secondary | ICD-10-CM | POA: Diagnosis not present

## 2021-10-01 DIAGNOSIS — C7951 Secondary malignant neoplasm of bone: Secondary | ICD-10-CM | POA: Insufficient documentation

## 2021-10-01 LAB — RAD ONC ARIA SESSION SUMMARY
Course Elapsed Days: 0
Plan Fractions Treated to Date: 1
Plan Prescribed Dose Per Fraction: 3 Gy
Plan Total Fractions Prescribed: 10
Plan Total Prescribed Dose: 30 Gy
Reference Point Dosage Given to Date: 3 Gy
Reference Point Session Dosage Given: 3 Gy
Session Number: 1

## 2021-10-02 ENCOUNTER — Ambulatory Visit
Admission: RE | Admit: 2021-10-02 | Discharge: 2021-10-02 | Disposition: A | Discharge status: Home / Self Care | Payer: PPO | Source: Ambulatory Visit | Attending: Radiation Oncology | Admitting: Radiation Oncology

## 2021-10-02 ENCOUNTER — Other Ambulatory Visit: Payer: Self-pay

## 2021-10-02 DIAGNOSIS — Z510 Encounter for antineoplastic radiation therapy: Secondary | ICD-10-CM | POA: Diagnosis not present

## 2021-10-02 LAB — RAD ONC ARIA SESSION SUMMARY
Course Elapsed Days: 1
Plan Fractions Treated to Date: 2
Plan Prescribed Dose Per Fraction: 3 Gy
Plan Total Fractions Prescribed: 10
Plan Total Prescribed Dose: 30 Gy
Reference Point Dosage Given to Date: 6 Gy
Reference Point Session Dosage Given: 3 Gy
Session Number: 2

## 2021-10-03 ENCOUNTER — Ambulatory Visit
Admission: RE | Admit: 2021-10-03 | Discharge: 2021-10-03 | Disposition: A | Discharge status: Home / Self Care | Payer: PPO | Source: Ambulatory Visit | Attending: Radiation Oncology | Admitting: Radiation Oncology

## 2021-10-03 ENCOUNTER — Other Ambulatory Visit: Payer: Self-pay

## 2021-10-03 DIAGNOSIS — Z510 Encounter for antineoplastic radiation therapy: Secondary | ICD-10-CM | POA: Diagnosis not present

## 2021-10-03 LAB — RAD ONC ARIA SESSION SUMMARY
Course Elapsed Days: 2
Plan Fractions Treated to Date: 3
Plan Prescribed Dose Per Fraction: 3 Gy
Plan Total Fractions Prescribed: 10
Plan Total Prescribed Dose: 30 Gy
Reference Point Dosage Given to Date: 9 Gy
Reference Point Session Dosage Given: 3 Gy
Session Number: 3

## 2021-10-04 ENCOUNTER — Inpatient Hospital Stay: Payer: PPO | Attending: Radiation Oncology

## 2021-10-04 ENCOUNTER — Other Ambulatory Visit: Payer: Self-pay

## 2021-10-04 ENCOUNTER — Ambulatory Visit
Admission: RE | Admit: 2021-10-04 | Discharge: 2021-10-04 | Disposition: A | Discharge status: Home / Self Care | Payer: PPO | Source: Ambulatory Visit | Attending: Radiation Oncology | Admitting: Radiation Oncology

## 2021-10-04 DIAGNOSIS — C3412 Malignant neoplasm of upper lobe, left bronchus or lung: Secondary | ICD-10-CM

## 2021-10-04 DIAGNOSIS — Z510 Encounter for antineoplastic radiation therapy: Secondary | ICD-10-CM | POA: Diagnosis not present

## 2021-10-04 LAB — RAD ONC ARIA SESSION SUMMARY
Course Elapsed Days: 3
Plan Fractions Treated to Date: 4
Plan Prescribed Dose Per Fraction: 3 Gy
Plan Total Fractions Prescribed: 10
Plan Total Prescribed Dose: 30 Gy
Reference Point Dosage Given to Date: 12 Gy
Reference Point Session Dosage Given: 3 Gy
Session Number: 4

## 2021-10-04 MED ORDER — CYANOCOBALAMIN 1000 MCG/ML IJ SOLN
1000.0000 ug | Freq: Once | INTRAMUSCULAR | Status: AC
  Administered 2021-10-04: 1000 ug via INTRAMUSCULAR
  Filled 2021-10-04: qty 1

## 2021-10-07 ENCOUNTER — Other Ambulatory Visit: Payer: Self-pay

## 2021-10-07 ENCOUNTER — Ambulatory Visit
Admission: RE | Admit: 2021-10-07 | Discharge: 2021-10-07 | Disposition: A | Discharge status: Home / Self Care | Payer: PPO | Source: Ambulatory Visit | Attending: Radiation Oncology | Admitting: Radiation Oncology

## 2021-10-07 ENCOUNTER — Telehealth: Payer: Self-pay

## 2021-10-07 DIAGNOSIS — Z510 Encounter for antineoplastic radiation therapy: Secondary | ICD-10-CM | POA: Diagnosis not present

## 2021-10-07 DIAGNOSIS — C3412 Malignant neoplasm of upper lobe, left bronchus or lung: Secondary | ICD-10-CM | POA: Diagnosis not present

## 2021-10-07 DIAGNOSIS — C7951 Secondary malignant neoplasm of bone: Secondary | ICD-10-CM | POA: Diagnosis not present

## 2021-10-07 LAB — RAD ONC ARIA SESSION SUMMARY
Course Elapsed Days: 6
Plan Fractions Treated to Date: 5
Plan Prescribed Dose Per Fraction: 3 Gy
Plan Total Fractions Prescribed: 10
Plan Total Prescribed Dose: 30 Gy
Reference Point Dosage Given to Date: 15 Gy
Reference Point Session Dosage Given: 3 Gy
Session Number: 5

## 2021-10-07 NOTE — Telephone Encounter (Signed)
[place] Nurse w/[facility] [phone]  Called to report that pt's [place] visits has been put on 'hold' for the next couple of weeks. Per pt stated that pt is having radiation for the couple weeks.

## 2021-10-08 ENCOUNTER — Ambulatory Visit
Admission: RE | Admit: 2021-10-08 | Discharge: 2021-10-08 | Disposition: A | Discharge status: Home / Self Care | Payer: PPO | Source: Ambulatory Visit | Attending: Radiation Oncology | Admitting: Radiation Oncology

## 2021-10-08 ENCOUNTER — Inpatient Hospital Stay: Payer: PPO

## 2021-10-08 ENCOUNTER — Other Ambulatory Visit: Payer: Self-pay

## 2021-10-08 ENCOUNTER — Other Ambulatory Visit: Payer: Self-pay | Admitting: *Deleted

## 2021-10-08 DIAGNOSIS — Z510 Encounter for antineoplastic radiation therapy: Secondary | ICD-10-CM | POA: Diagnosis not present

## 2021-10-08 DIAGNOSIS — C7951 Secondary malignant neoplasm of bone: Secondary | ICD-10-CM

## 2021-10-08 LAB — RAD ONC ARIA SESSION SUMMARY
Course Elapsed Days: 7
Plan Fractions Treated to Date: 6
Plan Prescribed Dose Per Fraction: 3 Gy
Plan Total Fractions Prescribed: 10
Plan Total Prescribed Dose: 30 Gy
Reference Point Dosage Given to Date: 18 Gy
Reference Point Session Dosage Given: 3 Gy
Session Number: 6

## 2021-10-08 LAB — CBC
HCT: 29.6 % — ABNORMAL LOW (ref 39.0–52.0)
Hemoglobin: 9.5 g/dL — ABNORMAL LOW (ref 13.0–17.0)
MCH: 31.9 pg (ref 26.0–34.0)
MCHC: 32.1 g/dL (ref 30.0–36.0)
MCV: 99.3 fL (ref 80.0–100.0)
Platelets: 155 10*3/uL (ref 150–400)
RBC: 2.98 MIL/uL — ABNORMAL LOW (ref 4.22–5.81)
RDW: 16.8 % — ABNORMAL HIGH (ref 11.5–15.5)
WBC: 5.2 10*3/uL (ref 4.0–10.5)
nRBC: 0 % (ref 0.0–0.2)

## 2021-10-09 ENCOUNTER — Other Ambulatory Visit: Payer: Self-pay | Admitting: Oncology

## 2021-10-09 ENCOUNTER — Ambulatory Visit
Admission: RE | Admit: 2021-10-09 | Discharge: 2021-10-09 | Disposition: A | Discharge status: Home / Self Care | Payer: PPO | Source: Ambulatory Visit | Attending: Radiation Oncology | Admitting: Radiation Oncology

## 2021-10-09 ENCOUNTER — Other Ambulatory Visit: Payer: Self-pay

## 2021-10-09 DIAGNOSIS — Z510 Encounter for antineoplastic radiation therapy: Secondary | ICD-10-CM | POA: Diagnosis not present

## 2021-10-09 LAB — RAD ONC ARIA SESSION SUMMARY
Course Elapsed Days: 8
Plan Fractions Treated to Date: 7
Plan Prescribed Dose Per Fraction: 3 Gy
Plan Total Fractions Prescribed: 10
Plan Total Prescribed Dose: 30 Gy
Reference Point Dosage Given to Date: 21 Gy
Reference Point Session Dosage Given: 3 Gy
Session Number: 7

## 2021-10-09 MED ORDER — OXYCODONE-ACETAMINOPHEN 7.5-325 MG PO TABS: 1.0000 | ORAL_TABLET | ORAL | 0 refills | Status: DC | PRN

## 2021-10-09 MED ORDER — LORAZEPAM 0.5 MG PO TABS: 0.5000 mg | ORAL_TABLET | ORAL | 0 refills | Status: DC

## 2021-10-10 ENCOUNTER — Other Ambulatory Visit: Payer: Self-pay

## 2021-10-10 ENCOUNTER — Ambulatory Visit
Admission: RE | Admit: 2021-10-10 | Discharge: 2021-10-10 | Disposition: A | Discharge status: Home / Self Care | Payer: PPO | Source: Ambulatory Visit | Attending: Radiation Oncology | Admitting: Radiation Oncology

## 2021-10-10 DIAGNOSIS — Z510 Encounter for antineoplastic radiation therapy: Secondary | ICD-10-CM | POA: Diagnosis not present

## 2021-10-10 LAB — RAD ONC ARIA SESSION SUMMARY
Course Elapsed Days: 9
Plan Fractions Treated to Date: 8
Plan Prescribed Dose Per Fraction: 3 Gy
Plan Total Fractions Prescribed: 10
Plan Total Prescribed Dose: 30 Gy
Reference Point Dosage Given to Date: 24 Gy
Reference Point Session Dosage Given: 3 Gy
Session Number: 8

## 2021-10-11 ENCOUNTER — Other Ambulatory Visit: Payer: Self-pay

## 2021-10-11 ENCOUNTER — Ambulatory Visit
Admission: RE | Admit: 2021-10-11 | Discharge: 2021-10-11 | Disposition: A | Discharge status: Home / Self Care | Payer: PPO | Source: Ambulatory Visit | Attending: Radiation Oncology | Admitting: Radiation Oncology

## 2021-10-11 DIAGNOSIS — R918 Other nonspecific abnormal finding of lung field: Secondary | ICD-10-CM | POA: Diagnosis not present

## 2021-10-11 DIAGNOSIS — I2699 Other pulmonary embolism without acute cor pulmonale: Secondary | ICD-10-CM | POA: Diagnosis not present

## 2021-10-11 DIAGNOSIS — I614 Nontraumatic intracerebral hemorrhage in cerebellum: Secondary | ICD-10-CM | POA: Diagnosis not present

## 2021-10-11 DIAGNOSIS — L89150 Pressure ulcer of sacral region, unstageable: Secondary | ICD-10-CM | POA: Diagnosis not present

## 2021-10-11 DIAGNOSIS — Z794 Long term (current) use of insulin: Secondary | ICD-10-CM | POA: Diagnosis not present

## 2021-10-11 DIAGNOSIS — E1165 Type 2 diabetes mellitus with hyperglycemia: Secondary | ICD-10-CM | POA: Diagnosis not present

## 2021-10-11 DIAGNOSIS — C3412 Malignant neoplasm of upper lobe, left bronchus or lung: Secondary | ICD-10-CM | POA: Diagnosis not present

## 2021-10-11 DIAGNOSIS — E785 Hyperlipidemia, unspecified: Secondary | ICD-10-CM | POA: Diagnosis not present

## 2021-10-11 DIAGNOSIS — I6529 Occlusion and stenosis of unspecified carotid artery: Secondary | ICD-10-CM | POA: Diagnosis not present

## 2021-10-11 DIAGNOSIS — Z7901 Long term (current) use of anticoagulants: Secondary | ICD-10-CM | POA: Diagnosis not present

## 2021-10-11 DIAGNOSIS — Z510 Encounter for antineoplastic radiation therapy: Secondary | ICD-10-CM | POA: Diagnosis not present

## 2021-10-11 LAB — RAD ONC ARIA SESSION SUMMARY
Course Elapsed Days: 10
Plan Fractions Treated to Date: 9
Plan Prescribed Dose Per Fraction: 3 Gy
Plan Total Fractions Prescribed: 10
Plan Total Prescribed Dose: 30 Gy
Reference Point Dosage Given to Date: 27 Gy
Reference Point Session Dosage Given: 3 Gy
Session Number: 9

## 2021-10-12 ENCOUNTER — Other Ambulatory Visit: Payer: Self-pay | Admitting: Family Medicine

## 2021-10-14 ENCOUNTER — Ambulatory Visit
Admission: RE | Admit: 2021-10-14 | Discharge: 2021-10-14 | Disposition: A | Discharge status: Home / Self Care | Payer: PPO | Source: Ambulatory Visit | Attending: Radiation Oncology | Admitting: Radiation Oncology

## 2021-10-14 ENCOUNTER — Other Ambulatory Visit: Payer: Self-pay

## 2021-10-14 DIAGNOSIS — C7951 Secondary malignant neoplasm of bone: Secondary | ICD-10-CM | POA: Diagnosis not present

## 2021-10-14 DIAGNOSIS — Z510 Encounter for antineoplastic radiation therapy: Secondary | ICD-10-CM | POA: Diagnosis not present

## 2021-10-14 DIAGNOSIS — C3412 Malignant neoplasm of upper lobe, left bronchus or lung: Secondary | ICD-10-CM | POA: Diagnosis not present

## 2021-10-14 LAB — RAD ONC ARIA SESSION SUMMARY
Course Elapsed Days: 13
Plan Fractions Treated to Date: 10
Plan Prescribed Dose Per Fraction: 3 Gy
Plan Total Fractions Prescribed: 10
Plan Total Prescribed Dose: 30 Gy
Reference Point Dosage Given to Date: 30 Gy
Reference Point Session Dosage Given: 3 Gy
Session Number: 10

## 2021-10-14 NOTE — Telephone Encounter (Signed)
Prescriber not at this practice  Requested Prescriptions  Refused Prescriptions Disp Refills  . metoprolol succinate (TOPROL-XL) 25 MG 24 hr tablet [Pharmacy Med Name: METOPROLOL ER SUCCINATE 25MG  TABS] 30 tablet 0    Sig: TAKE 1/2 TABLET(12.5 MG) BY MOUTH DAILY     Cardiovascular:  Beta Blockers Failed - 10/12/2021  8:37 AM      Failed - Last BP in normal range    BP Readings from Last 1 Encounters:  09/27/21 (!) 143/71         Passed - Last Heart Rate in normal range    Pulse Readings from Last 1 Encounters:  09/27/21 70         Passed - Valid encounter within last 6 months    Recent Outpatient Visits          3 months ago Uncontrolled type 2 diabetes mellitus with hypoglycemia, unspecified hypoglycemia coma status ([place])   [place] Pickard, Cammie Mcgee, MD   4 months ago Uncontrolled type 2 diabetes mellitus with hypoglycemia, unspecified hypoglycemia coma status ([place])   [place] Pickard, Cammie Mcgee, MD   2 years ago [place]   [place] Dennard Schaumann, Cammie Mcgee, MD   4 years ago Encounter for hepatitis C screening test for low risk patient   [place] [name], MD   4 years ago Type 2 diabetes mellitus without complication, with long-term current use of insulin ([place])   [place], [place] Nunnery, MD      Future Appointments            Tomorrow Raulkar, Clide Deutscher, MD [facility] Physical Medicine and Rehabilitation, CPR

## 2021-10-15 ENCOUNTER — Encounter: Payer: PPO | Admitting: Physical Medicine and Rehabilitation

## 2021-10-18 MED FILL — Fosaprepitant Dimeglumine For IV Infusion 150 MG (Base Eq): INTRAVENOUS | Qty: 5 | Status: AC

## 2021-10-18 MED FILL — Dexamethasone Sodium Phosphate Inj 100 MG/10ML: INTRAMUSCULAR | Qty: 1 | Status: AC

## 2021-10-21 ENCOUNTER — Inpatient Hospital Stay: Payer: PPO

## 2021-10-21 ENCOUNTER — Encounter: Payer: Self-pay | Admitting: Oncology

## 2021-10-21 ENCOUNTER — Inpatient Hospital Stay: Payer: PPO | Admitting: Oncology

## 2021-10-21 VITALS — BP 144/71 | HR 67 | Temp 97.3°F | Resp 16 | Wt 154.1 lb

## 2021-10-21 DIAGNOSIS — Z87891 Personal history of nicotine dependence: Secondary | ICD-10-CM | POA: Diagnosis not present

## 2021-10-21 DIAGNOSIS — C7951 Secondary malignant neoplasm of bone: Secondary | ICD-10-CM | POA: Insufficient documentation

## 2021-10-21 DIAGNOSIS — Z7952 Long term (current) use of systemic steroids: Secondary | ICD-10-CM | POA: Diagnosis not present

## 2021-10-21 DIAGNOSIS — Z5111 Encounter for antineoplastic chemotherapy: Secondary | ICD-10-CM

## 2021-10-21 DIAGNOSIS — D518 Other vitamin B12 deficiency anemias: Secondary | ICD-10-CM | POA: Diagnosis not present

## 2021-10-21 DIAGNOSIS — D6481 Anemia due to antineoplastic chemotherapy: Secondary | ICD-10-CM | POA: Diagnosis not present

## 2021-10-21 DIAGNOSIS — C3412 Malignant neoplasm of upper lobe, left bronchus or lung: Secondary | ICD-10-CM | POA: Diagnosis not present

## 2021-10-21 DIAGNOSIS — Z79899 Other long term (current) drug therapy: Secondary | ICD-10-CM | POA: Insufficient documentation

## 2021-10-21 DIAGNOSIS — I10 Essential (primary) hypertension: Secondary | ICD-10-CM | POA: Diagnosis not present

## 2021-10-21 DIAGNOSIS — I2699 Other pulmonary embolism without acute cor pulmonale: Secondary | ICD-10-CM | POA: Insufficient documentation

## 2021-10-21 DIAGNOSIS — N400 Enlarged prostate without lower urinary tract symptoms: Secondary | ICD-10-CM | POA: Diagnosis not present

## 2021-10-21 DIAGNOSIS — Z794 Long term (current) use of insulin: Secondary | ICD-10-CM | POA: Insufficient documentation

## 2021-10-21 DIAGNOSIS — T451X5A Adverse effect of antineoplastic and immunosuppressive drugs, initial encounter: Secondary | ICD-10-CM | POA: Diagnosis not present

## 2021-10-21 DIAGNOSIS — E876 Hypokalemia: Secondary | ICD-10-CM | POA: Diagnosis not present

## 2021-10-21 DIAGNOSIS — C7931 Secondary malignant neoplasm of brain: Secondary | ICD-10-CM | POA: Insufficient documentation

## 2021-10-21 DIAGNOSIS — I619 Nontraumatic intracerebral hemorrhage, unspecified: Secondary | ICD-10-CM | POA: Diagnosis not present

## 2021-10-21 DIAGNOSIS — I700 Atherosclerosis of aorta: Secondary | ICD-10-CM | POA: Diagnosis not present

## 2021-10-21 DIAGNOSIS — E538 Deficiency of other specified B group vitamins: Secondary | ICD-10-CM | POA: Insufficient documentation

## 2021-10-21 DIAGNOSIS — Z85820 Personal history of malignant melanoma of skin: Secondary | ICD-10-CM | POA: Insufficient documentation

## 2021-10-21 DIAGNOSIS — Z7901 Long term (current) use of anticoagulants: Secondary | ICD-10-CM | POA: Insufficient documentation

## 2021-10-21 DIAGNOSIS — Z86711 Personal history of pulmonary embolism: Secondary | ICD-10-CM | POA: Diagnosis not present

## 2021-10-21 DIAGNOSIS — G893 Neoplasm related pain (acute) (chronic): Secondary | ICD-10-CM | POA: Diagnosis not present

## 2021-10-21 DIAGNOSIS — E11319 Type 2 diabetes mellitus with unspecified diabetic retinopathy without macular edema: Secondary | ICD-10-CM | POA: Diagnosis not present

## 2021-10-21 DIAGNOSIS — E119 Type 2 diabetes mellitus without complications: Secondary | ICD-10-CM | POA: Insufficient documentation

## 2021-10-21 DIAGNOSIS — E785 Hyperlipidemia, unspecified: Secondary | ICD-10-CM | POA: Diagnosis not present

## 2021-10-21 DIAGNOSIS — K219 Gastro-esophageal reflux disease without esophagitis: Secondary | ICD-10-CM | POA: Diagnosis not present

## 2021-10-21 LAB — TSH: TSH: 1.677 u[IU]/mL (ref 0.350–4.500)

## 2021-10-21 LAB — COMPREHENSIVE METABOLIC PANEL
ALT: 25 U/L (ref 0–44)
AST: 23 U/L (ref 15–41)
Albumin: 3.2 g/dL — ABNORMAL LOW (ref 3.5–5.0)
Alkaline Phosphatase: 85 U/L (ref 38–126)
Anion gap: 5 (ref 5–15)
BUN: 9 mg/dL (ref 8–23)
CO2: 27 mmol/L (ref 22–32)
Calcium: 8.6 mg/dL — ABNORMAL LOW (ref 8.9–10.3)
Chloride: 106 mmol/L (ref 98–111)
Creatinine, Ser: 0.62 mg/dL (ref 0.61–1.24)
GFR, Estimated: >60 mL/min (ref >60)
Glucose, Bld: 132 mg/dL — ABNORMAL HIGH (ref 70–99)
Potassium: 3.3 mmol/L — ABNORMAL LOW (ref 3.5–5.1)
Sodium: 138 mmol/L (ref 135–145)
Total Bilirubin: 0.3 mg/dL (ref 0.3–1.2)
Total Protein: 6.3 g/dL — ABNORMAL LOW (ref 6.5–8.1)

## 2021-10-21 LAB — CBC WITH DIFFERENTIAL/PLATELET
Abs Immature Granulocytes: 0.03 10*3/uL (ref 0.00–0.07)
Basophils Absolute: 0 10*3/uL (ref 0.0–0.1)
Basophils Relative: 1 %
Eosinophils Absolute: 0.1 10*3/uL (ref 0.0–0.5)
Eosinophils Relative: 2 %
HCT: 30.5 % — ABNORMAL LOW (ref 39.0–52.0)
Hemoglobin: 9.9 g/dL — ABNORMAL LOW (ref 13.0–17.0)
Immature Granulocytes: 1 %
Lymphocytes Relative: 18 %
Lymphs Abs: 0.8 10*3/uL (ref 0.7–4.0)
MCH: 32 pg (ref 26.0–34.0)
MCHC: 32.5 g/dL (ref 30.0–36.0)
MCV: 98.7 fL (ref 80.0–100.0)
Monocytes Absolute: 0.6 10*3/uL (ref 0.1–1.0)
Monocytes Relative: 15 %
Neutro Abs: 2.7 10*3/uL (ref 1.7–7.7)
Neutrophils Relative %: 63 %
Platelets: 410 10*3/uL — ABNORMAL HIGH (ref 150–400)
RBC: 3.09 MIL/uL — ABNORMAL LOW (ref 4.22–5.81)
RDW: 16.7 % — ABNORMAL HIGH (ref 11.5–15.5)
WBC: 4.3 10*3/uL (ref 4.0–10.5)
nRBC: 0 % (ref 0.0–0.2)

## 2021-10-21 LAB — T4, FREE: Free T4: 0.62 ng/dL (ref 0.61–1.12)

## 2021-10-21 MED ORDER — SODIUM CHLORIDE 0.9 % IV SOLN
150.0000 mg | Freq: Once | INTRAVENOUS | Status: AC
  Administered 2021-10-21: 150 mg via INTRAVENOUS
  Filled 2021-10-21: qty 5

## 2021-10-21 MED ORDER — HEPARIN SOD (PORK) LOCK FLUSH 100 UNIT/ML IV SOLN
500.0000 [IU] | Freq: Once | INTRAVENOUS | Status: AC | PRN
  Administered 2021-10-21: 500 [IU]
  Filled 2021-10-21: qty 5

## 2021-10-21 MED ORDER — SODIUM CHLORIDE 0.9 % IV SOLN
Freq: Once | INTRAVENOUS | Status: AC
  Filled 2021-10-21: qty 250

## 2021-10-21 MED ORDER — OYSTER SHELL CALCIUM/D3 500-5 MG-MCG PO TABS: 2.0000 | ORAL_TABLET | Freq: Every day | ORAL | 3 refills | Status: DC

## 2021-10-21 MED ORDER — PALONOSETRON HCL INJECTION 0.25 MG/5ML
0.2500 mg | Freq: Once | INTRAVENOUS | Status: AC
  Administered 2021-10-21: 0.25 mg via INTRAVENOUS
  Filled 2021-10-21: qty 5

## 2021-10-21 MED ORDER — SODIUM CHLORIDE 0.9 % IV SOLN
400.0000 mg/m2 | Freq: Once | INTRAVENOUS | Status: AC
  Administered 2021-10-21: 700 mg via INTRAVENOUS
  Filled 2021-10-21: qty 8

## 2021-10-21 MED ORDER — SODIUM CHLORIDE 0.9 % IV SOLN
200.0000 mg | Freq: Once | INTRAVENOUS | Status: AC
  Administered 2021-10-21: 200 mg via INTRAVENOUS
  Filled 2021-10-21: qty 8

## 2021-10-21 MED ORDER — SODIUM CHLORIDE 0.9 % IV SOLN
423.5000 mg | Freq: Once | INTRAVENOUS | Status: AC
  Administered 2021-10-21: 420 mg via INTRAVENOUS
  Filled 2021-10-21: qty 42

## 2021-10-21 MED ORDER — ZOLEDRONIC ACID 4 MG/100ML IV SOLN
4.0000 mg | Freq: Once | INTRAVENOUS | Status: AC
  Administered 2021-10-21: 4 mg via INTRAVENOUS
  Filled 2021-10-21: qty 100

## 2021-10-21 MED ORDER — SODIUM CHLORIDE 0.9 % IV SOLN
10.0000 mg | Freq: Once | INTRAVENOUS | Status: AC
  Administered 2021-10-21: 10 mg via INTRAVENOUS
  Filled 2021-10-21: qty 1

## 2021-10-21 MED ORDER — POTASSIUM CHLORIDE CRYS ER 10 MEQ PO TBCR: 10.0000 meq | EXTENDED_RELEASE_TABLET | Freq: Every day | ORAL | 0 refills | Status: DC

## 2021-10-21 NOTE — Patient Instructions (Signed)
[facility] [place]  Discharge Instructions: Thank you for choosing [facility] to provide your oncology and hematology care.  If you have a lab appointment with the [facility], please go directly to the [facility] and check in at the registration area.  Wear comfortable clothing and clothing appropriate for easy access to any Portacath or PICC line.   We strive to give you quality time with your provider. You may need to reschedule your appointment if you arrive late (15 or more minutes).  Arriving late affects you and other patients whose appointments are after yours.  Also, if you miss three or more appointments without notifying the office, you may be dismissed from the clinic at the provider's discretion.      For prescription refill requests, have your pharmacy contact our office and allow 72 hours for refills to be completed.    Today you received the following chemotherapy and/or immunotherapy agents Keytruda, Alimta, Paraplatin, & Zometa      To help prevent nausea and vomiting after your treatment, we encourage you to take your nausea medication as directed.  BELOW ARE SYMPTOMS THAT SHOULD BE REPORTED IMMEDIATELY: *FEVER GREATER THAN 100.4 F (38 C) OR HIGHER *CHILLS OR SWEATING *NAUSEA AND VOMITING THAT IS NOT CONTROLLED WITH YOUR NAUSEA MEDICATION *UNUSUAL SHORTNESS OF BREATH *UNUSUAL BRUISING OR BLEEDING *URINARY PROBLEMS (pain or burning when urinating, or frequent urination) *BOWEL PROBLEMS (unusual diarrhea, constipation, pain near the anus) TENDERNESS IN MOUTH AND THROAT WITH OR WITHOUT PRESENCE OF ULCERS (sore throat, sores in mouth, or a toothache) UNUSUAL RASH, SWELLING OR PAIN  UNUSUAL VAGINAL DISCHARGE OR ITCHING   Items with * indicate a potential emergency and should be followed up as soon as possible or go to the Emergency Department if any problems should occur.  Please show the CHEMOTHERAPY ALERT CARD or  IMMUNOTHERAPY ALERT CARD at check-in to the Emergency Department and triage nurse.  Should you have questions after your visit or need to cancel or reschedule your appointment, please contact [facility] [place] [facility] [place]  [phone] and follow the prompts.  Office hours are 8:00 a.m. to 4:30 p.m. Monday - Friday. Please note that voicemails left after 4:00 p.m. may not be returned until the following business day.  We are closed weekends and major holidays. You have access to a nurse at all times for urgent questions. Please call the main number to the clinic [phone] and follow the prompts.  For any non-urgent questions, you may also contact your provider using MyChart. We now offer e-Visits for anyone 63 and older to request care online for non-urgent symptoms. For details visit [email or web address].   Also download the MyChart app! Go to the app store, search "MyChart", open the app, select [facility], and log in with your MyChart username and password.  Masks are optional in the cancer centers. If you would like for your care team to wear a mask while they are taking care of you, please let them know. For doctor visits, patients may have with them one support person who is at least 71 years old. At this time, visitors are not allowed in the infusion area.

## 2021-10-21 NOTE — Progress Notes (Signed)
Pt in for follow up, denies any concerns today. 

## 2021-10-21 NOTE — Assessment & Plan Note (Signed)
Chemotherapy plan as listed above 

## 2021-10-21 NOTE — Assessment & Plan Note (Signed)
On  B12 injection monthly

## 2021-10-21 NOTE — Progress Notes (Signed)
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  Labs are reviewed and discussed with patient. Proceed with carboplatin/Alimta/Keytruda today. -Carboplatin increased to AUC 5  #Hypokalemia, recommend potassium supplementation.  Encounter for antineoplastic chemotherapy Chemotherapy plan as listed above  B12 deficiency anemia On  B12 injection monthly  Intraparenchymal hemorrhage of brain (HCC) Eliquis is decreased to 2.$RemoveBefore'5mg'pydxtqPzXgDPW$  BID.  Repeat MRI brain in 2 months.   Neoplasm related pain Percocet PRN. He rarely utilize it.   Anemia due to antineoplastic chemotherapy Hemoglobin is slight decreased. Monitor.   Metastasis to bone (HCC) Recommend Zometa every 4- 6 weeks. Proceed with Zometa today. Recommend calcium supplementation.   Pulmonary embolus (HCC) Eliquis 2.$RemoveBeforeD'5mg'lsrWNJzMjueVgU$  BID.   Orders Placed This Encounter  Procedures   Vitamin B12    Standing Status:   Future    Standing Expiration Date:   11/11/2022   CBC with Differential    Standing Status:   Future    Standing Expiration Date:   11/12/2022   Comprehensive metabolic panel    Standing Status:   Future    Standing Expiration Date:   11/12/2022   CBC with Differential    Standing Status:   Future    Standing Expiration Date:   12/03/2022   Comprehensive metabolic panel    Standing Status:   Future    Standing Expiration Date:   12/03/2022   T4    Standing Status:   Future    Standing Expiration Date:   12/03/2022   TSH     Standing Status:   Future    Standing Expiration Date:   12/03/2022   Vitamin B12    Standing Status:   Future    Standing Expiration Date:   01/13/2023   CBC with Differential    Standing Status:   Future    Standing Expiration Date:   01/14/2023   Comprehensive metabolic panel    Standing Status:   Future    Standing Expiration Date:   01/14/2023   T4, free    Standing Status:   Future    Standing Expiration Date:   12/24/2022   CBC with Differential    Standing Status:   Future    Standing Expiration Date:   12/24/2022   Comprehensive metabolic panel    Standing Status:   Future    Standing Expiration Date:   12/24/2022    Treatment today.   Lab MD Alimta [name]. 3 weeks  All questions were answered. The patient knows to call the clinic with any problems, questions or concerns.  [name], MD, [name] [facility] Hematology Oncology 10/21/2021      CHIEF COMPLAINTS/REASON FOR VISIT:  non-small cell lung cancer  HISTORY OF PRESENTING ILLNESS:   Micheal Donovan is a  71 y.o.  male presents for follow up of Non-small cell lung cancer.  Oncology History Overview Note  Diagnosis: Stage IIB T2b N1 M0 adenocarcinoma of the LUL, poorly differentiated  Primary non-small cell carcinoma of upper lobe of left lung ([place])  06/13/2021 Initial Diagnosis   Primary non-small cell carcinoma of upper lobe of left lung [facility]) -06/20/2021 - 06/27/2021, patient presented to [facility] due to progressive headache/dizziness/gait changes. CT head showed acute to subacute intraparenchymal hemorrhage involving the left cerebellum.  Surrounding low-density vasogenic edema.  Patient was transferred to [facility]. This was further evaluated by CT angiogram of the neck which showed bulky calcified plaques/stenosis of carotid artery, Followed by MRI brain. 06/12/2021, MRI of the brain showed no significant interval change in size of the left cerebellar intraparenchymal hematoma with unchanged  regional mass effect and partial effacement of fourth ventricle but no upstream hydrocephalus. There is no discernible enhancement to suggest underlying mass lesion, though acute blood products could mask enhancement.   06/12/2021 a chest x-ray showed a 4.4 cm left middle lobe. 06/13/2021, CT chest with contrast showed a 4.3 x 3.6 x 3.3 cm lobular spiculated mass in the posterior left upper lobe with T3 to the lateral pleura and major fissure.  Metastatic left hilar lymphadenopathy.  Peripheral micronodularity posterior right costophrenic sulcus.  Aortic atherosclerosis. 06/14/2021, CT abdomen pelvis showed right lower lobe pulmonary artery embolus.  No evidence of right heart strain.  No acute intra-abdominal or pelvic pathology.  Aortic atherosclerosis.  06/13/2021, patient underwent bronchoscopy with EBUS by Dr. Tamala Julian.  Biopsy from the fine-needle aspiration of station 11 mL showed malignant cells, consistent with poorly differentiated non-small cell carcinoma, consistent with adenocarcinoma.  Malignant cells are TTF-1 positive and negative for p40.  Negative for neuroendocrine markers.  Blood test and tissue sample were tested for Gardant 360  -PD-L1 TPS 97%, no actionable mutation on the blood testing. 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   07/17/2021 Cancer Staging   Staging form: Lung, AJCC 8th Edition - Clinical: Stage IV (cT2b, cN1, cM1) - Signed by [name], MD on 08/06/2021   08/06/2021 Imaging   I saw patient's PET scan after his visit with me on 08/06/21. PET scan was ordered by his previous oncologist group and did not come to my in basket.. Patient received cycle 1 carboplatin Taxol today.  He has started on radiation.  5/26/3 PET scan showed 3.8 cm hypermetabolic left upper lobe mass with hypermetabolic left hilar and infrahilar adenopathy.  There is approximately 8 scattered metastatic lesions in the skeleton. Mixed density photopenic lesion in the left cerebellum.  characterized as hemorrhage on recent prior imaging workups.    08/06/2021 - 08/06/2021 Chemotherapy   Patient is on Treatment Plan : LUNG Carboplatin / Paclitaxel + XRT q7d     08/16/2021 -  Chemotherapy   Switched to systemic chemotherapy with  Carboplatin (4.5) + Pemetrexed (400) + Pembrolizumab (200) D1 q21d Induction x 4 cycles      08/16/2021 -  Chemotherapy   Patient is on Treatment Plan : LUNG Carboplatin (5) + Pemetrexed (500) + Pembrolizumab (200) D1 q21d Induction x 4 cycles / Maintenance Pemetrexed (500) + Pembrolizumab (200) D1 q21d     08/22/2021 Imaging   MRI brain w wo contrast  Decrease in size of left cerebellar hematoma with resolution of edema. No evidence of underlying lesion. Smaller, more recent hemorrhage in the left cerebellar vermis with minimal edema. There is minimal enhancement without definite evidence of underlying lesion.  New punctate focus of chronic blood products and enhancement in the left frontal lobe. Additional new foci of chronic blood products in the posterior right putamen, right parietal  subcortical white matter, and posterior right cerebellum. Unclear at this time if these represent foci of bland hemorrhage or early metastases.   Increase in size of right parietal osseous metastasis with minor extraosseous extension.    10/22/2021 - 10/22/2021 Chemotherapy   Patient is on Treatment Plan : LUNG Carboplatin (5) + Pemetrexed (500) + Pembrolizumab (200) D1 q21d Induction x 4 cycles / Maintenance Pemetrexed (500) + Pembrolizumab (200) D1 q21d     # Patient has a history of melanoma on his neck, treated in 2009. # History of hemorrhagic infarct left cerebellum  INTERVAL HISTORY CLESTON LAUTNER is a 71 y.o. male who has above history reviewed by me today presents for follow up visit for management of  Stage IV lung adenocarcianoma Patient has gained weight.  Overall tolerates chemotherapy. # Pulmonary embolism, SOB is stable, not worse.  He tolerates  chemotherapy. + fatigue , no nausea vomiting diarrhea.  Denies any new complaints today.  Review of Systems  Constitutional:  Positive for fatigue. Negative for appetite change, chills, fever and unexpected weight change.  HENT:   Negative for hearing loss and voice change.   Eyes:  Negative for eye problems and icterus.  Respiratory:  Positive for shortness of breath. Negative for chest tightness and cough.   Cardiovascular:  Negative for chest pain and leg swelling.  Gastrointestinal:  Negative for abdominal distention, abdominal pain and blood in stool.  Endocrine: Negative for hot flashes.  Genitourinary:  Negative for difficulty urinating, dysuria and frequency.   Musculoskeletal:  Negative for arthralgias.  Skin:  Negative for itching and rash.  Neurological:  Positive for headaches. Negative for extremity weakness, light-headedness and numbness.  Hematological:  Negative for adenopathy. Does not bruise/bleed easily.  Psychiatric/Behavioral:  Negative for confusion.     MEDICAL HISTORY:  Past Medical History:  Diagnosis Date   Allergy    BPH (benign prostatic hypertrophy)    Cancer (HCC)    Melanoma on Neck    2008   Carotid artery occlusion    Diabetes mellitus    type 2   ED (erectile dysfunction)    GERD (gastroesophageal reflux disease)    Hemorrhagic stroke ([place]) 06/2021   Hyperlipidemia    Hypertension    Lung cancer ([place])    Retinopathy due to secondary DM ([place])     SURGICAL HISTORY: Past Surgical History:  Procedure Laterality Date   BRONCHIAL NEEDLE ASPIRATION BIOPSY  06/17/2021   Procedure: BRONCHIAL NEEDLE ASPIRATION BIOPSIES;  Surgeon: Candee Furbish, MD;  Location: [facility] ENDOSCOPY;  Service: Pulmonary;;   IR IMAGING GUIDED PORT INSERTION  08/05/2021   MELANOMA EXCISION  2008   Left side of neck   RADIOLOGY WITH ANESTHESIA N/A 04/05/2020   Procedure: MRI SPINE WITOUT CONTRAST;  Surgeon: Radiologist, Medication, MD;  Location: [place];  Service: Radiology;   Laterality: N/A;   RADIOLOGY WITH ANESTHESIA N/A 08/22/2021   Procedure: MRI BRAIN WITH AND WITHOUT CONTRAST  WITH ANESTHESIA;  Surgeon: Radiologist, Medication, MD;  Location: [place];  Service: Radiology;  Laterality: N/A;   TONSILLECTOMY     VIDEO BRONCHOSCOPY WITH ENDOBRONCHIAL ULTRASOUND N/A 06/17/2021   Procedure: VIDEO BRONCHOSCOPY WITH ENDOBRONCHIAL ULTRASOUND;  Surgeon: Candee Furbish, MD;  Location[facility] ENDOSCOPY;  Service: Pulmonary;  Laterality: N/A;    SOCIAL HISTORY: Social History   Socioeconomic History   Marital status: Married    Spouse name: Not on file   Number of children: Not on file   Years of education: Not on  file   Highest education level: Not on file  Occupational History   Not on file  Tobacco Use   Smoking status: Former    Types: Cigarettes    Quit date: 07/21/1990    Years since quitting: 31.2   Smokeless tobacco: Never  Vaping Use   Vaping Use: Never used  Substance and Sexual Activity   Alcohol use: No   Drug use: No   Sexual activity: Not on file  Other Topics Concern   Not on file  Social History Narrative   Not on file   Social Determinants of Health   Financial Resource Strain: Low Risk  (07/31/2021)   Overall Financial Resource Strain (CARDIA)    Difficulty of Paying Living Expenses: Not very hard  Food Insecurity: No Food Insecurity (07/31/2021)   Hunger Vital Sign    Worried About Running Out of Food in the Last Year: Never true    Ran Out of Food in the Last Year: Never true  Transportation Needs: No Transportation Needs (07/31/2021)   PRAPARE - Hydrologist (Medical): No    Lack of Transportation (Non-Medical): No  Physical Activity: Inactive (07/31/2021)   Exercise Vital Sign    Days of Exercise per Week: 0 days    Minutes of Exercise per Session: 0 min  Stress: Unknown (07/31/2021)   [place]    Feeling of Stress : Patient refused   Social Connections: Unknown (07/31/2021)   Social Connection and Isolation Panel [NHANES]    Frequency of Communication with Friends and Family: Three times a week    Frequency of Social Gatherings with Friends and Family: Three times a week    Attends Religious Services: Patient refused    Active Member of Clubs or Organizations: Patient refused    Attends Archivist Meetings: Patient refused    Marital Status: Married  Human resources officer Violence: Not At Risk (07/31/2021)   Humiliation, Afraid, Rape, and Kick questionnaire    Fear of Current or Ex-Partner: No    Emotionally Abused: No    Physically Abused: No    Sexually Abused: No    FAMILY HISTORY: Family History  Problem Relation Age of Onset   COPD Mother    Heart disease Father    Heart disease Brother        MI at age 94   Stroke Neg Hx     ALLERGIES:  is allergic to niaspan [niacin].  MEDICATIONS:  Current Outpatient Medications  Medication Sig Dispense Refill   amLODipine (NORVASC) 10 MG tablet TAKE 1 TABLET(10 MG) BY MOUTH DAILY 90 tablet 0   apixaban (ELIQUIS) 2.5 MG TABS tablet Take 1 tablet (2.5 mg total) by mouth 2 (two) times daily. 60 tablet 3   atorvastatin (LIPITOR) 40 MG tablet TAKE 1 TABLET(40 MG) BY MOUTH DAILY 90 tablet 2   calcium-vitamin D (OSCAL WITH D) 500-5 MG-MCG tablet Take 2 tablets by mouth daily. 60 tablet 3   collagenase (SANTYL) 250 UNIT/GM ointment Apply 1 Application topically daily. 15 g 0   dexamethasone (DECADRON) 4 MG tablet Take 1 tab every 12 hours the day before pemetrexed chemo, then take 2 tabs once a day for 3 days starting the day after carboplatin. 30 tablet 1   folic acid (FOLVITE) 1 MG tablet Take 1 tablet (1 mg total) by mouth daily. Start 7 days before pemetrexed chemotherapy. Continue until 21 days after pemetrexed completed. 100 tablet 3  insulin glargine (LANTUS) 100 UNIT/ML Solostar Pen Inject 25 Units into the skin daily. (Patient taking differently: Inject 15-30  Units into the skin daily as needed (high blood sugar).) 15 mL 11   LORazepam (ATIVAN) 0.5 MG tablet Take 1 tablet (0.5 mg total) by mouth See admin instructions. Take 1 tablet 30 minutes prior to imaging procedure, may take another tablet if needed.Avoid driving after taking medication. 2 tablet 0   losartan (COZAAR) 50 MG tablet TAKE 1 TABLET(50 MG) BY MOUTH DAILY 90 tablet 0   metoprolol succinate (TOPROL-XL) 25 MG 24 hr tablet Take 0.5 tablets (12.5 mg total) by mouth daily. 30 tablet 0   NOVOLOG FLEXPEN 100 UNIT/ML FlexPen Inject into the skin.     omeprazole (PRILOSEC OTC) 20 MG tablet Take 20 mg by mouth daily.     ONETOUCH ULTRA test strip      oxyCODONE-acetaminophen (PERCOCET) 7.5-325 MG tablet Take 1 tablet by mouth every 4 (four) hours as needed for severe pain. 60 tablet 0   pantoprazole (PROTONIX) 40 MG tablet Take 1 tablet (40 mg total) by mouth daily at 6 (six) AM. 90 tablet 0   potassium chloride (KLOR-CON M) 10 MEQ tablet Take 1 tablet (10 mEq total) by mouth daily. 30 tablet 0   topiramate (TOPAMAX) 100 MG tablet TAKE 1 TABLET(100 MG) BY MOUTH AT BEDTIME 30 tablet 0   traZODone (DESYREL) 50 MG tablet Take 1 tablet (50 mg total) by mouth at bedtime as needed for sleep. 30 tablet 1   UNABLE TO FIND PLEASE CHECK FASTING BLOOD GLUCOSE EVERY MORNING 1 each 0   No current facility-administered medications for this visit.     PHYSICAL EXAMINATION:  Vitals:   10/21/21 0848  BP: (!) 144/71  Pulse: 67  Resp: 16  Temp: (!) 97.3 F (36.3 C)  SpO2: 100%   Filed Weights   10/21/21 0848  Weight: 154 lb 1.6 oz (69.9 kg)    Physical Exam Constitutional:      General: He is not in acute distress.    Comments: Patient sits in the wheelchair  HENT:     Head: Normocephalic and atraumatic.  Eyes:     General: No scleral icterus. Cardiovascular:     Rate and Rhythm: Normal rate and regular rhythm.     Heart sounds: Normal heart sounds.  Pulmonary:     Effort: Pulmonary  effort is normal. No respiratory distress.     Breath sounds: No wheezing.     Comments: Decreased breath sound bilaterally. Abdominal:     General: Bowel sounds are normal. There is no distension.     Palpations: Abdomen is soft.  Musculoskeletal:        General: No deformity. Normal range of motion.     Cervical back: Normal range of motion and neck supple.  Skin:    General: Skin is warm and dry.     Findings: No erythema or rash.  Neurological:     Mental Status: He is alert and oriented to person, place, and time. Mental status is at baseline.     Cranial Nerves: No cranial nerve deficit.     Coordination: Coordination normal.  Psychiatric:        Mood and Affect: Mood normal.     LABORATORY DATA:  I have reviewed the data as listed Lab Results  Component Value Date   WBC 4.3 10/21/2021   HGB 9.9 (L) 10/21/2021   HCT 30.5 (L) 10/21/2021   MCV 98.7 10/21/2021  PLT 410 (H) 10/21/2021   Recent Labs    09/19/21 0810 09/27/21 0816 10/21/21 0816  NA 137 135 138  K 3.7 4.3 3.3*  CL 108 103 106  CO2 $Re'25 23 27  'AGY$ GLUCOSE 183* 228* 132*  BUN $Re'12 17 9  'tNf$ CREATININE 0.59* 0.77 0.62  CALCIUM 8.4* 9.8 8.6*  GFRNONAA >60 >60 >60  PROT 6.3* 7.2 6.3*  ALBUMIN 3.4* 3.9 3.2*  AST $Re'19 24 23  'lJt$ ALT 15 32 25  ALKPHOS 129* 127* 85  BILITOT 0.5 0.5 0.3    Iron/TIBC/Ferritin/ %Sat    Component Value Date/Time   IRON 50 09/19/2021 0810   TIBC 305 09/19/2021 0810   FERRITIN 249 09/19/2021 0810   IRONPCTSAT 16 (L) 09/19/2021 0810      RADIOGRAPHIC STUDIES: I have personally reviewed the radiological images as listed and agreed with the findings in the report. No results found.

## 2021-10-21 NOTE — Assessment & Plan Note (Signed)
Eliquis 2.5mg  BID.

## 2021-10-21 NOTE — Assessment & Plan Note (Addendum)
Recommend Zometa every 4- 6 weeks. Proceed with Zometa today. Recommend calcium supplementation.

## 2021-10-21 NOTE — Assessment & Plan Note (Addendum)
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  Labs are reviewed and discussed with patient. Proceed with carboplatin/Alimta/Keytruda today. -Carboplatin increased to AUC 5  #Hypokalemia, recommend potassium supplementation.

## 2021-10-21 NOTE — Assessment & Plan Note (Signed)
Percocet PRN. He rarely utilize it.

## 2021-10-21 NOTE — Assessment & Plan Note (Signed)
Eliquis is decreased to 2.5mg  BID.  Repeat MRI brain in 2 months.

## 2021-10-21 NOTE — Assessment & Plan Note (Signed)
Hemoglobin is slight decreased. Monitor.

## 2021-10-21 NOTE — Progress Notes (Signed)
ON PATHWAY REGIMEN - Non-Small Cell Lung  No Change  Continue With Treatment as Ordered.  Original Decision Date/Time: 08/06/2021 17:06     A cycle is every 21 days:     Pembrolizumab      Pemetrexed      Carboplatin   **Always confirm dose/schedule in your pharmacy ordering system**  Patient Characteristics: Stage IV Metastatic, Nonsquamous, Molecular Analysis Completed, Molecular Alteration Present and Targeted Therapy Exhausted OR EGFR Exon 20+ or KRAS G12C+ or HER2+ Present and No Prior Chemo/Immunotherapy OR No Alteration Present, Initial  Chemotherapy/Immunotherapy, PS = 0, 1, No Alteration Present, No Alteration Present, Candidate for Immunotherapy, PD-L1 Expression Positive  ? 50% (TPS) and Immunotherapy Candidate Therapeutic Status: Stage IV Metastatic Histology: Nonsquamous Cell Broad Molecular Profiling Status: Engineer, manufacturing Analysis Results: No Alteration Present ECOG Performance Status: 1 Chemotherapy/Immunotherapy Line of Therapy: Initial Chemotherapy/Immunotherapy EGFR Exons 18-21 Mutation Testing Status: Completed and Negative ALK Fusion/Rearrangement Testing Status: Completed and Negative BRAF V600 Mutation Testing Status: Completed and Negative KRAS G12C Mutation Testing Status: Completed and Negative MET Exon 14 Mutation Testing Status: Completed and Negative RET Fusion/Rearrangement Testing Status: Completed and Negative HER2 Mutation Testing Status: Completed and Negative NTRK Fusion/Rearrangement Testing Status: Completed and Negative ROS1 Fusion/Rearrangement Testing Status: Completed and Negative Immunotherapy Candidate Status: Candidate for Immunotherapy PD-L1 Expression Status: PD-L1 Positive ? 50% (TPS) Intent of Therapy: Non-Curative / Palliative Intent, Discussed with Patient

## 2021-10-29 ENCOUNTER — Ambulatory Visit
Admission: RE | Admit: 2021-10-29 | Discharge: 2021-10-29 | Disposition: A | Discharge status: Home / Self Care | Payer: PPO | Source: Ambulatory Visit | Attending: Oncology | Admitting: Oncology

## 2021-10-29 DIAGNOSIS — C3412 Malignant neoplasm of upper lobe, left bronchus or lung: Secondary | ICD-10-CM | POA: Insufficient documentation

## 2021-10-29 DIAGNOSIS — I2510 Atherosclerotic heart disease of native coronary artery without angina pectoris: Secondary | ICD-10-CM | POA: Diagnosis not present

## 2021-10-29 DIAGNOSIS — I700 Atherosclerosis of aorta: Secondary | ICD-10-CM | POA: Insufficient documentation

## 2021-10-29 DIAGNOSIS — R918 Other nonspecific abnormal finding of lung field: Secondary | ICD-10-CM | POA: Diagnosis not present

## 2021-10-29 LAB — GLUCOSE, CAPILLARY: Glucose-Capillary: 147 mg/dL — ABNORMAL HIGH (ref 70–99)

## 2021-10-29 MED ORDER — FLUDEOXYGLUCOSE F - 18 (FDG) INJECTION
8.0000 | Freq: Once | INTRAVENOUS | Status: AC | PRN
  Administered 2021-10-29: 8.61 via INTRAVENOUS

## 2021-10-31 ENCOUNTER — Telehealth: Payer: Self-pay | Admitting: *Deleted

## 2021-10-31 ENCOUNTER — Telehealth: Payer: Self-pay | Admitting: Oncology

## 2021-10-31 NOTE — Telephone Encounter (Signed)
Patient will be coming in for [facility]om Management [facility] visit tomorrow afternoon

## 2021-10-31 NOTE — Telephone Encounter (Signed)
Dr. Tasia Catchings advise [facility] eval.

## 2021-10-31 NOTE — Telephone Encounter (Signed)
pt showing symptoms since last traetment, Daughter called to discuss with someone and would like to be called back .Marland KitchenKJ

## 2021-10-31 NOTE — Telephone Encounter (Signed)
Daughter [name] called reporting that patient had his 4th Keytruda treatment a week and a half ago and his dose got increased with this last treatment, He is having weakness, decreased appetite, Constipation and abdominal cramping, body aches, He had a fever of 100.9 last night took Tylenol and it went done and has not come back since. He has pain medicine but only likes to tak eit at night so he is alert. She said he has very bad heartburn and is eating a lot of Tums. SH eis asking that we order prescription for his GERD.  She said that he is diabetic and is drinking a lot of diet pepsi and sprite I told her that could be agrevationg the heartburn with all the carbonation She said he hates water, but will drink gatorade. She also reports that he will be mad if he finds out that she callus and asks that we call him to check how he is doing since his dose was increased otherwise he may refuse to come in. She also reports that he is napping a lot recently. He has nausea as well. Please advise

## 2021-10-31 NOTE — Telephone Encounter (Signed)
Patient had tx on 10/21/21 and is still very weak/fatigued with no appetite.  Admits he doesn't drink water.  Usually feels fatigued after tx but doesn't last this long.  Did have nausea/vomiting that has improved.   Also request results of PET scan obtained yesterday.

## 2021-11-01 ENCOUNTER — Other Ambulatory Visit: Payer: Self-pay

## 2021-11-01 ENCOUNTER — Inpatient Hospital Stay (HOSPITAL_BASED_OUTPATIENT_CLINIC_OR_DEPARTMENT_OTHER): Payer: PPO | Admitting: Hospice and Palliative Medicine

## 2021-11-01 ENCOUNTER — Inpatient Hospital Stay: Payer: PPO

## 2021-11-01 ENCOUNTER — Inpatient Hospital Stay: Payer: PPO | Attending: Radiation Oncology

## 2021-11-01 ENCOUNTER — Encounter: Payer: PPO | Admitting: Physical Medicine and Rehabilitation

## 2021-11-01 DIAGNOSIS — E876 Hypokalemia: Secondary | ICD-10-CM | POA: Diagnosis not present

## 2021-11-01 DIAGNOSIS — C3412 Malignant neoplasm of upper lobe, left bronchus or lung: Secondary | ICD-10-CM | POA: Insufficient documentation

## 2021-11-01 DIAGNOSIS — Z7952 Long term (current) use of systemic steroids: Secondary | ICD-10-CM | POA: Insufficient documentation

## 2021-11-01 DIAGNOSIS — G893 Neoplasm related pain (acute) (chronic): Secondary | ICD-10-CM | POA: Insufficient documentation

## 2021-11-01 DIAGNOSIS — Z7901 Long term (current) use of anticoagulants: Secondary | ICD-10-CM | POA: Diagnosis not present

## 2021-11-01 DIAGNOSIS — N401 Enlarged prostate with lower urinary tract symptoms: Secondary | ICD-10-CM | POA: Diagnosis not present

## 2021-11-01 DIAGNOSIS — T451X5A Adverse effect of antineoplastic and immunosuppressive drugs, initial encounter: Secondary | ICD-10-CM | POA: Insufficient documentation

## 2021-11-01 DIAGNOSIS — Z794 Long term (current) use of insulin: Secondary | ICD-10-CM | POA: Diagnosis not present

## 2021-11-01 DIAGNOSIS — Z95828 Presence of other vascular implants and grafts: Secondary | ICD-10-CM

## 2021-11-01 DIAGNOSIS — K5900 Constipation, unspecified: Secondary | ICD-10-CM | POA: Diagnosis not present

## 2021-11-01 DIAGNOSIS — Z5112 Encounter for antineoplastic immunotherapy: Secondary | ICD-10-CM | POA: Insufficient documentation

## 2021-11-01 DIAGNOSIS — D6481 Anemia due to antineoplastic chemotherapy: Secondary | ICD-10-CM | POA: Insufficient documentation

## 2021-11-01 DIAGNOSIS — C7951 Secondary malignant neoplasm of bone: Secondary | ICD-10-CM | POA: Diagnosis not present

## 2021-11-01 DIAGNOSIS — I2699 Other pulmonary embolism without acute cor pulmonale: Secondary | ICD-10-CM | POA: Diagnosis not present

## 2021-11-01 DIAGNOSIS — I700 Atherosclerosis of aorta: Secondary | ICD-10-CM | POA: Diagnosis not present

## 2021-11-01 DIAGNOSIS — E871 Hypo-osmolality and hyponatremia: Secondary | ICD-10-CM | POA: Diagnosis not present

## 2021-11-01 DIAGNOSIS — E538 Deficiency of other specified B group vitamins: Secondary | ICD-10-CM | POA: Insufficient documentation

## 2021-11-01 DIAGNOSIS — Z79899 Other long term (current) drug therapy: Secondary | ICD-10-CM | POA: Insufficient documentation

## 2021-11-01 DIAGNOSIS — R350 Frequency of micturition: Secondary | ICD-10-CM | POA: Diagnosis not present

## 2021-11-01 DIAGNOSIS — R300 Dysuria: Secondary | ICD-10-CM

## 2021-11-01 LAB — URINALYSIS, COMPLETE (UACMP) WITH MICROSCOPIC
Bacteria, UA: NONE SEEN
Bilirubin Urine: NEGATIVE
Glucose, UA: 150 mg/dL — AB
Hgb urine dipstick: NEGATIVE
Ketones, ur: NEGATIVE mg/dL
Leukocytes,Ua: NEGATIVE
Nitrite: NEGATIVE
Protein, ur: NEGATIVE mg/dL
Specific Gravity, Urine: 1.013 (ref 1.005–1.030)
Squamous Epithelial / HPF: NONE SEEN (ref 0–5)
pH: 5 (ref 5.0–8.0)

## 2021-11-01 LAB — COMPREHENSIVE METABOLIC PANEL
ALT: 19 U/L (ref 0–44)
AST: 21 U/L (ref 15–41)
Albumin: 3.1 g/dL — ABNORMAL LOW (ref 3.5–5.0)
Alkaline Phosphatase: 91 U/L (ref 38–126)
Anion gap: 9 (ref 5–15)
BUN: 12 mg/dL (ref 8–23)
CO2: 24 mmol/L (ref 22–32)
Calcium: 8.1 mg/dL — ABNORMAL LOW (ref 8.9–10.3)
Chloride: 96 mmol/L — ABNORMAL LOW (ref 98–111)
Creatinine, Ser: 0.74 mg/dL (ref 0.61–1.24)
GFR, Estimated: >60 mL/min (ref >60)
Glucose, Bld: 206 mg/dL — ABNORMAL HIGH (ref 70–99)
Potassium: 3.4 mmol/L — ABNORMAL LOW (ref 3.5–5.1)
Sodium: 129 mmol/L — ABNORMAL LOW (ref 135–145)
Total Bilirubin: 0.2 mg/dL — ABNORMAL LOW (ref 0.3–1.2)
Total Protein: 6.8 g/dL (ref 6.5–8.1)

## 2021-11-01 LAB — CBC WITH DIFFERENTIAL/PLATELET
Abs Immature Granulocytes: 0.05 10*3/uL (ref 0.00–0.07)
Basophils Absolute: 0 10*3/uL (ref 0.0–0.1)
Basophils Relative: 0 %
Eosinophils Absolute: 0 10*3/uL (ref 0.0–0.5)
Eosinophils Relative: 0 %
HCT: 28.2 % — ABNORMAL LOW (ref 39.0–52.0)
Hemoglobin: 9.5 g/dL — ABNORMAL LOW (ref 13.0–17.0)
Immature Granulocytes: 1 %
Lymphocytes Relative: 7 %
Lymphs Abs: 0.5 10*3/uL — ABNORMAL LOW (ref 0.7–4.0)
MCH: 32.2 pg (ref 26.0–34.0)
MCHC: 33.7 g/dL (ref 30.0–36.0)
MCV: 95.6 fL (ref 80.0–100.0)
Monocytes Absolute: 1 10*3/uL (ref 0.1–1.0)
Monocytes Relative: 15 %
Neutro Abs: 4.8 10*3/uL (ref 1.7–7.7)
Neutrophils Relative %: 77 %
Platelets: 103 10*3/uL — ABNORMAL LOW (ref 150–400)
RBC: 2.95 MIL/uL — ABNORMAL LOW (ref 4.22–5.81)
RDW: 15.5 % (ref 11.5–15.5)
WBC: 6.2 10*3/uL (ref 4.0–10.5)
nRBC: 0 % (ref 0.0–0.2)

## 2021-11-01 LAB — MAGNESIUM: Magnesium: 2 mg/dL (ref 1.7–2.4)

## 2021-11-01 MED ORDER — HEPARIN SOD (PORK) LOCK FLUSH 100 UNIT/ML IV SOLN
500.0000 [IU] | Freq: Once | INTRAVENOUS | Status: AC
  Administered 2021-11-01: 500 [IU] via INTRAVENOUS
  Filled 2021-11-01: qty 5

## 2021-11-01 MED ORDER — SODIUM CHLORIDE 0.9 % IV SOLN
INTRAVENOUS | Status: DC
  Filled 2021-11-01 (×2): qty 250

## 2021-11-01 MED ORDER — SODIUM CHLORIDE 0.9% FLUSH
10.0000 mL | Freq: Once | INTRAVENOUS | Status: AC
  Administered 2021-11-01: 10 mL via INTRAVENOUS
  Filled 2021-11-01: qty 10

## 2021-11-01 MED ORDER — POTASSIUM CHLORIDE 10 MEQ/100ML IV SOLN
10.0000 meq | Freq: Once | INTRAVENOUS | Status: AC
  Administered 2021-11-01: 10 meq via INTRAVENOUS
  Filled 2021-11-01: qty 100

## 2021-11-01 NOTE — Progress Notes (Signed)
Symptom Management [place] at [facility] [phone] [phone]  Patient Care Team: Default, Provider, MD as PCP - General [name], RN as Oncology Nurse Navigator   NAME OF PATIENT: Micheal Donovan  [id number]  1951/02/28   DATE OF VISIT: 11/01/21  REASON FOR CONSULT: Micheal Donovan is a 71 y.o. male with multiple medical problems including including non-small cell lung cancer, history of subacute intraparenchymal hemorrhage, PE.  Patient is on systemic chemotherapy and immunotherapy.   INTERVAL HISTORY:  Patient last received chemotherapy and immunotherapy on 10/28/2021.  He reports that since he received treatment he has felt poorly with reduced oral intake.  Had an episode earlier this week of nausea and vomiting but that has resolved.  He also endorses some constipation.  Have some urinary frequency but denies urgency or dysuria.  He says that his wife checked his temperature on Monday of this week and temperature was 99 or 100 but he has not had a fever since.  Denies any neurologic complaints.  Denies any easy bleeding or bruising. Reports good appetite and denies weight loss. Denies chest pain. Denies any nausea, vomiting, constipation, or diarrhea. Denies urinary complaints. Patient offers no further specific complaints today.   PAST MEDICAL HISTORY: Past Medical History:  Diagnosis Date   Allergy    BPH (benign prostatic hypertrophy)    Cancer (HCC)    Melanoma on Neck    2008   Carotid artery occlusion    Diabetes mellitus    type 2   ED (erectile dysfunction)    GERD (gastroesophageal reflux disease)    Hemorrhagic stroke ([place]) 06/2021   Hyperlipidemia    Hypertension    Lung cancer ([place])    Retinopathy due to secondary DM ([place])     PAST SURGICAL HISTORY:  Past Surgical History:  Procedure Laterality Date   BRONCHIAL NEEDLE ASPIRATION BIOPSY  06/17/2021   Procedure: BRONCHIAL NEEDLE ASPIRATION BIOPSIES;   Surgeon: Candee Furbish, MD;  Location: [facility] ENDOSCOPY;  Service: Pulmonary;;   IR IMAGING GUIDED PORT INSERTION  08/05/2021   MELANOMA EXCISION  2008   Left side of neck   RADIOLOGY WITH ANESTHESIA N/A 04/05/2020   Procedure: MRI SPINE WITOUT CONTRAST;  Surgeon: Radiologist, Medication, MD;  Location: [place];  Service: Radiology;  Laterality: N/A;   RADIOLOGY WITH ANESTHESIA N/A 08/22/2021   Procedure: MRI BRAIN WITH AND WITHOUT CONTRAST  WITH ANESTHESIA;  Surgeon: Radiologist, Medication, MD;  Location: [place];  Service: Radiology;  Laterality: N/A;   TONSILLECTOMY     VIDEO BRONCHOSCOPY WITH ENDOBRONCHIAL ULTRASOUND N/A 06/17/2021   Procedure: VIDEO BRONCHOSCOPY WITH ENDOBRONCHIAL ULTRASOUND;  Surgeon: Candee Furbish, MD;  Location: [facility] ENDOSCOPY;  Service: Pulmonary;  Laterality: N/A;    HEMATOLOGY/ONCOLOGY HISTORY:  Oncology History Overview Note  Diagnosis: Stage IIB T2b N1 M0 adenocarcinoma of the LUL, poorly differentiated     Primary non-small cell carcinoma of upper lobe of left lung ([place])  06/13/2021 Initial Diagnosis   Primary non-small cell carcinoma of upper lobe of left lung [facility]) -06/20/2021 - 06/27/2021, patient presented to [facility] due to progressive headache/dizziness/gait changes. CT head showed acute to subacute intraparenchymal hemorrhage involving the left cerebellum.  Surrounding low-density vasogenic edema.  Patient was transferred to [facility]. This was further evaluated by CT angiogram of the neck which showed bulky calcified plaques/stenosis of carotid artery, Followed by MRI brain. 06/12/2021, MRI of the brain showed no significant interval change in size of the  left cerebellar intraparenchymal hematoma with unchanged regional mass effect and partial effacement of fourth ventricle but no upstream hydrocephalus. There is no discernible enhancement to suggest underlying mass lesion, though acute blood products could mask enhancement.   06/12/2021 a chest x-ray  showed a 4.4 cm left middle lobe. 06/13/2021, CT chest with contrast showed a 4.3 x 3.6 x 3.3 cm lobular spiculated mass in the posterior left upper lobe with T3 to the lateral pleura and major fissure.  Metastatic left hilar lymphadenopathy.  Peripheral micronodularity posterior right costophrenic sulcus.  Aortic atherosclerosis. 06/14/2021, CT abdomen pelvis showed right lower lobe pulmonary artery embolus.  No evidence of right heart strain.  No acute intra-abdominal or pelvic pathology.  Aortic atherosclerosis.  06/13/2021, patient underwent bronchoscopy with EBUS by Dr. Tamala Julian.  Biopsy from the fine-needle aspiration of station 11 mL showed malignant cells, consistent with poorly differentiated non-small cell carcinoma, consistent with adenocarcinoma.  Malignant cells are TTF-1 positive and negative for p40.  Negative for neuroendocrine markers.  Blood test and tissue sample were tested for Gardant 360  -PD-L1 TPS 97%, no actionable mutation on the blood testing. 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   07/17/2021 Cancer Staging   Staging form: Lung, AJCC 8th Edition - Clinical: Stage IV (cT2b, cN1, cM1) - Signed by [name], MD on 08/06/2021   08/06/2021 Imaging   I saw patient's PET scan after his visit with me on 08/06/21. PET scan was ordered by his previous oncologist group and did not come to my in basket.. Patient received cycle 1 carboplatin Taxol today.  He has started on radiation.  5/26/3 PET scan showed 3.8 cm hypermetabolic left upper lobe mass with hypermetabolic left hilar and infrahilar adenopathy.  There is approximately 8 scattered metastatic lesions in the skeleton. Mixed density photopenic lesion in the left cerebellum. characterized as hemorrhage on recent prior imaging workups.    08/06/2021 - 08/06/2021 Chemotherapy   Patient is on Treatment Plan : LUNG Carboplatin / Paclitaxel + XRT q7d     08/16/2021 -  Chemotherapy   Switched to systemic chemotherapy with   Carboplatin (4.5) + Pemetrexed (400) + Pembrolizumab (200) D1 q21d Induction x 4 cycles      08/16/2021 -  Chemotherapy   Patient is on Treatment Plan : LUNG Carboplatin (5) + Pemetrexed (500) + Pembrolizumab (200) D1 q21d Induction x 4 cycles / Maintenance Pemetrexed (500) + Pembrolizumab (200) D1 q21d     08/22/2021 Imaging   MRI brain w wo contrast  Decrease in size of left cerebellar hematoma with resolution of edema. No evidence of underlying lesion. Smaller, more recent hemorrhage in the left cerebellar vermis with minimal edema. There is minimal enhancement without definite evidence of underlying lesion.  New punctate focus of chronic blood products and enhancement in the left frontal lobe. Additional new foci of chronic blood products in the posterior right putamen, right parietal subcortical white matter, and posterior right cerebellum. Unclear at this time if these represent foci of bland hemorrhage or early metastases.   Increase in size of right parietal osseous metastasis with minor extraosseous extension.    10/22/2021 - 10/22/2021 Chemotherapy   Patient is on Treatment Plan : LUNG Carboplatin (5) + Pemetrexed (500) + Pembrolizumab (200) D1 q21d Induction x 4 cycles / Maintenance Pemetrexed (500) + Pembrolizumab (200) D1 q21d       ALLERGIES:  is allergic to niaspan [niacin].  MEDICATIONS:  Current Outpatient Medications  Medication Sig Dispense Refill   amLODipine (NORVASC)  10 MG tablet TAKE 1 TABLET(10 MG) BY MOUTH DAILY 90 tablet 0   apixaban (ELIQUIS) 2.5 MG TABS tablet Take 1 tablet (2.5 mg total) by mouth 2 (two) times daily. 60 tablet 3   atorvastatin (LIPITOR) 40 MG tablet TAKE 1 TABLET(40 MG) BY MOUTH DAILY 90 tablet 2   calcium-vitamin D (OSCAL WITH D) 500-5 MG-MCG tablet Take 2 tablets by mouth daily. 60 tablet 3   collagenase (SANTYL) 250 UNIT/GM ointment Apply 1 Application topically daily. 15 g 0   dexamethasone (DECADRON) 4 MG tablet Take 1 tab every 12 hours  the day before pemetrexed chemo, then take 2 tabs once a day for 3 days starting the day after carboplatin. 30 tablet 1   folic acid (FOLVITE) 1 MG tablet Take 1 tablet (1 mg total) by mouth daily. Start 7 days before pemetrexed chemotherapy. Continue until 21 days after pemetrexed completed. 100 tablet 3   insulin glargine (LANTUS) 100 UNIT/ML Solostar Pen Inject 25 Units into the skin daily. (Patient taking differently: Inject 15-30 Units into the skin daily as needed (high blood sugar).) 15 mL 11   LORazepam (ATIVAN) 0.5 MG tablet Take 1 tablet (0.5 mg total) by mouth See admin instructions. Take 1 tablet 30 minutes prior to imaging procedure, may take another tablet if needed.Avoid driving after taking medication. 2 tablet 0   losartan (COZAAR) 50 MG tablet TAKE 1 TABLET(50 MG) BY MOUTH DAILY 90 tablet 0   metoprolol succinate (TOPROL-XL) 25 MG 24 hr tablet Take 0.5 tablets (12.5 mg total) by mouth daily. 30 tablet 0   NOVOLOG FLEXPEN 100 UNIT/ML FlexPen Inject into the skin.     omeprazole (PRILOSEC OTC) 20 MG tablet Take 20 mg by mouth daily.     ONETOUCH ULTRA test strip      oxyCODONE-acetaminophen (PERCOCET) 7.5-325 MG tablet Take 1 tablet by mouth every 4 (four) hours as needed for severe pain. 60 tablet 0   pantoprazole (PROTONIX) 40 MG tablet Take 1 tablet (40 mg total) by mouth daily at 6 (six) AM. 90 tablet 0   potassium chloride (KLOR-CON M) 10 MEQ tablet Take 1 tablet (10 mEq total) by mouth daily. 30 tablet 0   topiramate (TOPAMAX) 100 MG tablet TAKE 1 TABLET(100 MG) BY MOUTH AT BEDTIME 30 tablet 0   traZODone (DESYREL) 50 MG tablet Take 1 tablet (50 mg total) by mouth at bedtime as needed for sleep. 30 tablet 1   UNABLE TO FIND PLEASE CHECK FASTING BLOOD GLUCOSE EVERY MORNING 1 each 0   No current facility-administered medications for this visit.    VITAL SIGNS: There were no vitals taken for this visit. There were no vitals filed for this visit.  Estimated body mass index is  24.14 kg/m as calculated from the following:   Height as of 09/27/21: _0  (1.702 m).   Weight as of 10/21/21: 154 lb 1.6 oz (69.9 kg).  LABS: CBC:    Component Value Date/Time   WBC 4.3 10/21/2021 0816   HGB 9.9 (L) 10/21/2021 0816   HGB 13.4 07/17/2021 1345   HCT 30.5 (L) 10/21/2021 0816   PLT 410 (H) 10/21/2021 0816   PLT 373 07/17/2021 1345   MCV 98.7 10/21/2021 0816   NEUTROABS 2.7 10/21/2021 0816   LYMPHSABS 0.8 10/21/2021 0816   MONOABS 0.6 10/21/2021 0816   EOSABS 0.1 10/21/2021 0816   BASOSABS 0.0 10/21/2021 0816   Comprehensive Metabolic Panel:    Component Value Date/Time   NA 138 10/21/2021 0816  K 3.3 (L) 10/21/2021 0816   CL 106 10/21/2021 0816   CO2 27 10/21/2021 0816   BUN 9 10/21/2021 0816   CREATININE 0.62 10/21/2021 0816   CREATININE 0.76 07/17/2021 1345   CREATININE 0.89 06/10/2021 1210   GLUCOSE 132 (H) 10/21/2021 0816   CALCIUM 8.6 (L) 10/21/2021 0816   AST 23 10/21/2021 0816   AST 18 07/17/2021 1345   ALT 25 10/21/2021 0816   ALT 20 07/17/2021 1345   ALKPHOS 85 10/21/2021 0816   BILITOT 0.3 10/21/2021 0816   BILITOT 0.6 07/17/2021 1345   PROT 6.3 (L) 10/21/2021 0816   ALBUMIN 3.2 (L) 10/21/2021 0816    RADIOGRAPHIC STUDIES: NM PET Image Restag (PS) Skull Base To Thigh  Result Date: 10/30/2021 CLINICAL DATA:  Subsequent treatment strategy for lung cancer. EXAM: NUCLEAR MEDICINE PET SKULL BASE TO THIGH TECHNIQUE: 8.6 mCi F-18 FDG was injected intravenously. Full-ring PET imaging was performed from the skull base to thigh after the radiotracer. CT data was obtained and used for attenuation correction and anatomic localization. Fasting blood glucose: 147 mg/dl COMPARISON:  07/24/2021. FINDINGS: Mediastinal blood pool activity: SUV max 1.9 Liver activity: SUV max NA NECK: No abnormal hypermetabolism. Incidental CT findings: Retention cysts or polyps in the maxillary sinuses. CHEST: Left upper lobe mass has decreased in size and hypermetabolism, now  measuring approximately 1.8 x 2.2 cm, SUV max 2.2, compared to 3.8 x 3.9 cm, SUV max 19.6. No hypermetabolic mediastinal, hilar or axillary lymph nodes. Incidental CT findings: Right IJ Port-A-Cath terminates in the right atrium. Atherosclerotic calcification of the aorta and coronary arteries. Heart is at the upper limits of normal in size. No pericardial effusion. 6 mm posterior left upper lobe nodule (2/68), likely unchanged and too small for PET resolution. ABDOMEN/PELVIS: No abnormal hypermetabolism in the liver, adrenal glands, spleen or pancreas. No hypermetabolic lymph nodes. Incidental CT findings: Liver, gallbladder adrenal glands are unremarkable. Subcentimeter low-attenuation lesion in the interpolar right kidney, too small to characterize. No specific follow-up necessary. Kidneys, spleen, pancreas, stomach and bowel are otherwise grossly unremarkable. Atherosclerotic calcification of the aorta. SKELETON: Interval decrease in hypermetabolism associated with bone metastases. Index lytic lesion in the right iliac wing has decreased to SUV max 2.1, from 15.9. Incidental CT findings: Degenerative changes in the spine. Healing pathologic fracture of the lateral left seventh rib. IMPRESSION: 1. Interval response to therapy as evidenced by a small residual left upper lobe nodule with decreased hypermetabolism, no residual hypermetabolic adenopathy and decreased hypermetabolism associated with osseous metastases. 2. 6 mm posterior left upper lobe nodule, likely stable. Recommend attention on follow-up. 3. Aortic atherosclerosis (ICD10-I70.0). Coronary artery calcification. Electronically Signed   By: Lorin Picket M.D.   On: 10/30/2021 08:41    PERFORMANCE STATUS (ECOG) : 1 - Symptomatic but completely ambulatory  Review of Systems Unless otherwise noted, a complete review of systems is negative.  Physical Exam General: NAD Cardiovascular: regular rate and rhythm Pulmonary: clear ant fields Abdomen:  soft, nontender, + bowel sounds GU: no suprapubic tenderness Extremities: no edema, no joint deformities Skin: no rashes Neurological: Weakness but otherwise nonfocal  IMPRESSION/PLAN: Hyponatremia  - patient nontoxic-appearing.  Suspect secondary to dehydration due to poor oral intake over the past week.  We will proceed with IV fluids today and discussed symptomatic care including antiemetics.  Push oral fluids.  If hyponatremia persists, will consider additional work-up.  Hypokalemia -replete IV K  Constipation -recommended Dulcolax daily  Urinary frequency -add on UA/culture  Patient to RTC next  week for labs/fluids  Case and plan discussed with Dr. Tasia Catchings  Patient expressed understanding and was in agreement with this plan. He also understands that He can call clinic at any time with any questions, concerns, or complaints.   Thank you for allowing me to participate in the care of this very pleasant patient.   Time Total: 20 minutes  Visit consisted of counseling and education dealing with the complex and emotionally intense issues of symptom management in the setting of serious illness.Greater than 50%  of this time was spent counseling and coordinating care related to the above assessment and plan.  Signed by: [name], [name], NP-C

## 2021-11-03 LAB — URINE CULTURE: Culture: NO GROWTH

## 2021-11-05 ENCOUNTER — Other Ambulatory Visit: Payer: PPO

## 2021-11-05 ENCOUNTER — Inpatient Hospital Stay: Payer: PPO

## 2021-11-05 ENCOUNTER — Ambulatory Visit: Payer: PPO

## 2021-11-05 VITALS — BP 131/68 | HR 87 | Temp 99.6°F | Resp 18

## 2021-11-05 DIAGNOSIS — C3412 Malignant neoplasm of upper lobe, left bronchus or lung: Secondary | ICD-10-CM

## 2021-11-05 DIAGNOSIS — Z95828 Presence of other vascular implants and grafts: Secondary | ICD-10-CM

## 2021-11-05 DIAGNOSIS — E876 Hypokalemia: Secondary | ICD-10-CM

## 2021-11-05 DIAGNOSIS — Z5112 Encounter for antineoplastic immunotherapy: Secondary | ICD-10-CM | POA: Diagnosis not present

## 2021-11-05 DIAGNOSIS — E871 Hypo-osmolality and hyponatremia: Secondary | ICD-10-CM

## 2021-11-05 LAB — COMPREHENSIVE METABOLIC PANEL
ALT: 29 U/L (ref 0–44)
AST: 26 U/L (ref 15–41)
Albumin: 2.9 g/dL — ABNORMAL LOW (ref 3.5–5.0)
Alkaline Phosphatase: 90 U/L (ref 38–126)
Anion gap: 11 (ref 5–15)
BUN: 9 mg/dL (ref 8–23)
CO2: 25 mmol/L (ref 22–32)
Calcium: 8.3 mg/dL — ABNORMAL LOW (ref 8.9–10.3)
Chloride: 95 mmol/L — ABNORMAL LOW (ref 98–111)
Creatinine, Ser: 0.7 mg/dL (ref 0.61–1.24)
GFR, Estimated: >60 mL/min (ref >60)
Glucose, Bld: 165 mg/dL — ABNORMAL HIGH (ref 70–99)
Potassium: 3.5 mmol/L (ref 3.5–5.1)
Sodium: 131 mmol/L — ABNORMAL LOW (ref 135–145)
Total Bilirubin: 0.2 mg/dL — ABNORMAL LOW (ref 0.3–1.2)
Total Protein: 6.8 g/dL (ref 6.5–8.1)

## 2021-11-05 LAB — CBC WITH DIFFERENTIAL/PLATELET
Abs Immature Granulocytes: 0.03 10*3/uL (ref 0.00–0.07)
Basophils Absolute: 0 10*3/uL (ref 0.0–0.1)
Basophils Relative: 0 %
Eosinophils Absolute: 0 10*3/uL (ref 0.0–0.5)
Eosinophils Relative: 0 %
HCT: 25.6 % — ABNORMAL LOW (ref 39.0–52.0)
Hemoglobin: 8.6 g/dL — ABNORMAL LOW (ref 13.0–17.0)
Immature Granulocytes: 1 %
Lymphocytes Relative: 11 %
Lymphs Abs: 0.5 10*3/uL — ABNORMAL LOW (ref 0.7–4.0)
MCH: 31.9 pg (ref 26.0–34.0)
MCHC: 33.6 g/dL (ref 30.0–36.0)
MCV: 94.8 fL (ref 80.0–100.0)
Monocytes Absolute: 0.5 10*3/uL (ref 0.1–1.0)
Monocytes Relative: 9 %
Neutro Abs: 3.9 10*3/uL (ref 1.7–7.7)
Neutrophils Relative %: 79 %
Platelets: 176 10*3/uL (ref 150–400)
RBC: 2.7 MIL/uL — ABNORMAL LOW (ref 4.22–5.81)
RDW: 15.4 % (ref 11.5–15.5)
WBC: 5 10*3/uL (ref 4.0–10.5)
nRBC: 0 % (ref 0.0–0.2)

## 2021-11-05 MED ORDER — SODIUM CHLORIDE 0.9 % IV SOLN
INTRAVENOUS | Status: DC
  Filled 2021-11-05 (×2): qty 250

## 2021-11-05 MED ORDER — SODIUM CHLORIDE 0.9% FLUSH
10.0000 mL | Freq: Once | INTRAVENOUS | Status: AC
  Administered 2021-11-05: 10 mL via INTRAVENOUS
  Filled 2021-11-05: qty 10

## 2021-11-05 MED ORDER — HEPARIN SOD (PORK) LOCK FLUSH 100 UNIT/ML IV SOLN
500.0000 [IU] | Freq: Once | INTRAVENOUS | Status: AC
  Administered 2021-11-05: 500 [IU]
  Filled 2021-11-05: qty 5

## 2021-11-06 ENCOUNTER — Other Ambulatory Visit: Payer: Self-pay | Admitting: *Deleted

## 2021-11-06 DIAGNOSIS — C3412 Malignant neoplasm of upper lobe, left bronchus or lung: Secondary | ICD-10-CM

## 2021-11-06 DIAGNOSIS — E876 Hypokalemia: Secondary | ICD-10-CM

## 2021-11-06 DIAGNOSIS — E871 Hypo-osmolality and hyponatremia: Secondary | ICD-10-CM

## 2021-11-07 ENCOUNTER — Other Ambulatory Visit: Payer: PPO

## 2021-11-07 ENCOUNTER — Encounter: Payer: PPO | Admitting: Hospice and Palliative Medicine

## 2021-11-07 ENCOUNTER — Ambulatory Visit: Payer: PPO

## 2021-11-08 ENCOUNTER — Inpatient Hospital Stay: Payer: PPO

## 2021-11-11 ENCOUNTER — Encounter: Payer: Self-pay | Admitting: Oncology

## 2021-11-11 ENCOUNTER — Inpatient Hospital Stay: Payer: PPO

## 2021-11-11 ENCOUNTER — Ambulatory Visit
Admission: RE | Admit: 2021-11-11 | Discharge: 2021-11-11 | Disposition: A | Discharge status: Home / Self Care | Payer: PPO | Source: Ambulatory Visit | Attending: Radiation Oncology | Admitting: Radiation Oncology

## 2021-11-11 ENCOUNTER — Inpatient Hospital Stay (HOSPITAL_BASED_OUTPATIENT_CLINIC_OR_DEPARTMENT_OTHER): Payer: PPO | Admitting: Oncology

## 2021-11-11 VITALS — BP 143/69 | HR 76 | Temp 96.0°F | Resp 18 | Ht 67.0 in | Wt 149.0 lb

## 2021-11-11 VITALS — BP 132/64 | HR 84 | Temp 87.0°F | Resp 16 | Ht 67.0 in | Wt 151.0 lb

## 2021-11-11 DIAGNOSIS — Z5111 Encounter for antineoplastic chemotherapy: Secondary | ICD-10-CM

## 2021-11-11 DIAGNOSIS — T451X5A Adverse effect of antineoplastic and immunosuppressive drugs, initial encounter: Secondary | ICD-10-CM

## 2021-11-11 DIAGNOSIS — C3412 Malignant neoplasm of upper lobe, left bronchus or lung: Secondary | ICD-10-CM

## 2021-11-11 DIAGNOSIS — D6481 Anemia due to antineoplastic chemotherapy: Secondary | ICD-10-CM

## 2021-11-11 DIAGNOSIS — C7951 Secondary malignant neoplasm of bone: Secondary | ICD-10-CM

## 2021-11-11 DIAGNOSIS — G893 Neoplasm related pain (acute) (chronic): Secondary | ICD-10-CM

## 2021-11-11 DIAGNOSIS — D518 Other vitamin B12 deficiency anemias: Secondary | ICD-10-CM

## 2021-11-11 DIAGNOSIS — Z5112 Encounter for antineoplastic immunotherapy: Secondary | ICD-10-CM | POA: Diagnosis not present

## 2021-11-11 DIAGNOSIS — I2699 Other pulmonary embolism without acute cor pulmonale: Secondary | ICD-10-CM | POA: Diagnosis not present

## 2021-11-11 DIAGNOSIS — R350 Frequency of micturition: Secondary | ICD-10-CM | POA: Diagnosis not present

## 2021-11-11 DIAGNOSIS — I619 Nontraumatic intracerebral hemorrhage, unspecified: Secondary | ICD-10-CM | POA: Diagnosis not present

## 2021-11-11 LAB — CBC WITH DIFFERENTIAL/PLATELET
Abs Immature Granulocytes: 0.07 10*3/uL (ref 0.00–0.07)
Basophils Absolute: 0 10*3/uL (ref 0.0–0.1)
Basophils Relative: 1 %
Eosinophils Absolute: 0.1 10*3/uL (ref 0.0–0.5)
Eosinophils Relative: 1 %
HCT: 25.6 % — ABNORMAL LOW (ref 39.0–52.0)
Hemoglobin: 8.2 g/dL — ABNORMAL LOW (ref 13.0–17.0)
Immature Granulocytes: 1 %
Lymphocytes Relative: 14 %
Lymphs Abs: 0.7 10*3/uL (ref 0.7–4.0)
MCH: 30.7 pg (ref 26.0–34.0)
MCHC: 32 g/dL (ref 30.0–36.0)
MCV: 95.9 fL (ref 80.0–100.0)
Monocytes Absolute: 0.7 10*3/uL (ref 0.1–1.0)
Monocytes Relative: 14 %
Neutro Abs: 3.8 10*3/uL (ref 1.7–7.7)
Neutrophils Relative %: 69 %
Platelets: 501 10*3/uL — ABNORMAL HIGH (ref 150–400)
RBC: 2.67 MIL/uL — ABNORMAL LOW (ref 4.22–5.81)
RDW: 15.3 % (ref 11.5–15.5)
WBC: 5.4 10*3/uL (ref 4.0–10.5)
nRBC: 0 % (ref 0.0–0.2)

## 2021-11-11 LAB — COMPREHENSIVE METABOLIC PANEL
ALT: 13 U/L (ref 0–44)
AST: 20 U/L (ref 15–41)
Albumin: 2.8 g/dL — ABNORMAL LOW (ref 3.5–5.0)
Alkaline Phosphatase: 79 U/L (ref 38–126)
Anion gap: 8 (ref 5–15)
BUN: 7 mg/dL — ABNORMAL LOW (ref 8–23)
CO2: 26 mmol/L (ref 22–32)
Calcium: 8.4 mg/dL — ABNORMAL LOW (ref 8.9–10.3)
Chloride: 104 mmol/L (ref 98–111)
Creatinine, Ser: 0.65 mg/dL (ref 0.61–1.24)
GFR, Estimated: >60 mL/min (ref >60)
Glucose, Bld: 148 mg/dL — ABNORMAL HIGH (ref 70–99)
Potassium: 3 mmol/L — ABNORMAL LOW (ref 3.5–5.1)
Sodium: 138 mmol/L (ref 135–145)
Total Bilirubin: 0.3 mg/dL (ref 0.3–1.2)
Total Protein: 6.7 g/dL (ref 6.5–8.1)

## 2021-11-11 LAB — VITAMIN B12: Vitamin B-12: 510 pg/mL (ref 180–914)

## 2021-11-11 MED ORDER — LORAZEPAM 0.5 MG PO TABS: 0.5000 mg | ORAL_TABLET | ORAL | 0 refills | Status: DC

## 2021-11-11 MED ORDER — SODIUM CHLORIDE 0.9 % IV SOLN
200.0000 mg | Freq: Once | INTRAVENOUS | Status: AC
  Administered 2021-11-11: 200 mg via INTRAVENOUS
  Filled 2021-11-11: qty 8

## 2021-11-11 MED ORDER — SODIUM CHLORIDE 0.9% FLUSH
10.0000 mL | Freq: Once | INTRAVENOUS | Status: AC
  Administered 2021-11-11: 10 mL via INTRAVENOUS
  Filled 2021-11-11: qty 10

## 2021-11-11 MED ORDER — CYANOCOBALAMIN 1000 MCG/ML IJ SOLN
1000.0000 ug | Freq: Once | INTRAMUSCULAR | Status: AC
  Administered 2021-11-11: 1000 ug via INTRAMUSCULAR
  Filled 2021-11-11: qty 1

## 2021-11-11 MED ORDER — TAMSULOSIN HCL 0.4 MG PO CAPS: 0.4000 mg | ORAL_CAPSULE | Freq: Every day | ORAL | 0 refills | Status: DC

## 2021-11-11 MED ORDER — HEPARIN SOD (PORK) LOCK FLUSH 100 UNIT/ML IV SOLN
INTRAVENOUS | Status: AC
  Filled 2021-11-11: qty 5

## 2021-11-11 MED ORDER — SODIUM CHLORIDE 0.9 % IV SOLN
400.0000 mg/m2 | Freq: Once | INTRAVENOUS | Status: AC
  Administered 2021-11-11: 700 mg via INTRAVENOUS
  Filled 2021-11-11: qty 28

## 2021-11-11 MED ORDER — SODIUM CHLORIDE 0.9 % IV SOLN: 500.0000 mg/m2 | Freq: Once | INTRAVENOUS | Status: DC

## 2021-11-11 MED ORDER — POTASSIUM CHLORIDE 20 MEQ/100ML IV SOLN
20.0000 meq | Freq: Once | INTRAVENOUS | Status: AC
  Administered 2021-11-11: 20 meq via INTRAVENOUS

## 2021-11-11 MED ORDER — HEPARIN SOD (PORK) LOCK FLUSH 100 UNIT/ML IV SOLN
500.0000 [IU] | Freq: Once | INTRAVENOUS | Status: AC | PRN
  Administered 2021-11-11: 500 [IU]
  Filled 2021-11-11: qty 5

## 2021-11-11 MED ORDER — SODIUM CHLORIDE 0.9 % IV SOLN
Freq: Once | INTRAVENOUS | Status: AC
  Filled 2021-11-11: qty 250

## 2021-11-11 MED ORDER — PROCHLORPERAZINE MALEATE 10 MG PO TABS
10.0000 mg | ORAL_TABLET | Freq: Once | ORAL | Status: AC
  Administered 2021-11-11: 10 mg via ORAL
  Filled 2021-11-11: qty 1

## 2021-11-11 NOTE — Assessment & Plan Note (Addendum)
Hemoglobin is  decreased. Monitor closed.  Repeat cbc in 7-10 days.

## 2021-11-11 NOTE — Progress Notes (Signed)
Patient been on Alimta 400mg /m2 - todays dose was 500mg /m2.  Contacted MD to clarify.  Give 400mg /m2 today - will evaluate increasing dose back on next cycle.

## 2021-11-11 NOTE — Patient Instructions (Signed)
[facility] AT [place]  Discharge Instructions: Thank you for choosing [facility] to provide your oncology and hematology care.  If you have a lab appointment with the [facility], please go directly to the [facility] and check in at the registration area.  Wear comfortable clothing and clothing appropriate for easy access to any Portacath or PICC line.   We strive to give you quality time with your provider. You may need to reschedule your appointment if you arrive late (15 or more minutes).  Arriving late affects you and other patients whose appointments are after yours.  Also, if you miss three or more appointments without notifying the office, you may be dismissed from the clinic at the provider's discretion.      For prescription refill requests, have your pharmacy contact our office and allow 72 hours for refills to be completed.    Today you received the following chemotherapy and/or immunotherapy agents Keytruda, Alimta      To help prevent nausea and vomiting after your treatment, we encourage you to take your nausea medication as directed.  BELOW ARE SYMPTOMS THAT SHOULD BE REPORTED IMMEDIATELY: *FEVER GREATER THAN 100.4 F (38 C) OR HIGHER *CHILLS OR SWEATING *NAUSEA AND VOMITING THAT IS NOT CONTROLLED WITH YOUR NAUSEA MEDICATION *UNUSUAL SHORTNESS OF BREATH *UNUSUAL BRUISING OR BLEEDING *URINARY PROBLEMS (pain or burning when urinating, or frequent urination) *BOWEL PROBLEMS (unusual diarrhea, constipation, pain near the anus) TENDERNESS IN MOUTH AND THROAT WITH OR WITHOUT PRESENCE OF ULCERS (sore throat, sores in mouth, or a toothache) UNUSUAL RASH, SWELLING OR PAIN  UNUSUAL VAGINAL DISCHARGE OR ITCHING   Items with * indicate a potential emergency and should be followed up as soon as possible or go to the Emergency Department if any problems should occur.  Please show the CHEMOTHERAPY ALERT CARD or IMMUNOTHERAPY ALERT CARD at  check-in to the Emergency Department and triage nurse.  Should you have questions after your visit or need to cancel or reschedule your appointment, please contact [facility] [place] AT [place]  [phone] and follow the prompts.  Office hours are 8:00 a.m. to 4:30 p.m. Monday - Friday. Please note that voicemails left after 4:00 p.m. may not be returned until the following business day.  We are closed weekends and major holidays. You have access to a nurse at all times for urgent questions. Please call the main number to the clinic [phone] and follow the prompts.  For any non-urgent questions, you may also contact your provider using MyChart. We now offer e-Visits for anyone 22 and older to request care online for non-urgent symptoms. For details visit [email or web address].   Also download the MyChart app! Go to the app store, search "MyChart", open the app, select [facility], and log in with your MyChart username and password.  Masks are optional in the cancer centers. If you would like for your care team to wear a mask while they are taking care of you, please let them know. For doctor visits, patients may have with them one support person who is at least 71 years old. At this time, visitors are not allowed in the infusion area.

## 2021-11-11 NOTE — Assessment & Plan Note (Addendum)
Eliquis is decreased to 2.5mg  BID.  Repeat MRI brain

## 2021-11-11 NOTE — Assessment & Plan Note (Addendum)
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  PET scan images were reviewed with patient.- good partial response.  Labs are reviewed and discussed with patient. Proceed with maintenance Alimta/Keytruda today.   #Hypokalemia, recommend potassium supplementation 55meq daily. IV KCL 25meq x 1 today

## 2021-11-11 NOTE — Assessment & Plan Note (Signed)
Chemotherapy plan as listed above 

## 2021-11-11 NOTE — Assessment & Plan Note (Signed)
Eliquis 2.5mg  BID.

## 2021-11-11 NOTE — Progress Notes (Signed)
Patient here for oncology follow-up appointment,  concerns of chronic back pain and urinary frequency

## 2021-11-11 NOTE — Progress Notes (Signed)
Hematology/Oncology Progress note [phone] [phone]            Patient Care Team: Default, Provider, MD as PCP - General Micheal Nab, RN as Oncology Nurse Navigator   ASSESSMENT & PLAN:   Cancer Staging  Primary non-small cell carcinoma of upper lobe of left lung ([place]) Staging form: Lung, AJCC 8th Edition - Clinical: Stage IV (cT2b, cN1, cM1) - Signed by Micheal Server, MD on 08/06/2021   Primary non-small cell carcinoma of upper lobe of left lung ([place]) Stage IV lung adenocarcinoma with brain and bone metastasis.  PET scan images were reviewed with patient.- good partial response.  Labs are reviewed and discussed with patient. Proceed with maintenance Alimta/Keytruda today.   #Hypokalemia, recommend potassium supplementation 45meq daily. IV KCL 62meq x 1 today  Encounter for antineoplastic chemotherapy Chemotherapy plan as listed above  Anemia due to antineoplastic chemotherapy Hemoglobin is  decreased. Monitor closed.  Repeat cbc in 7-10 days.   Neoplasm related pain Back pain, will see Dr.Chrystal today for evaluation of RT  PRN percocet  Intraparenchymal hemorrhage of brain (HCC) Eliquis is decreased to 2.$RemoveBefore'5mg'dmMCqntrGhOjJ$  BID.  Repeat MRI brain  Pulmonary embolus (HCC) Eliquis 2.$RemoveBeforeD'5mg'yWVVMjWxhZkjXx$  BID.   Metastasis to bone (HCC) Recommend Zometa every 4- 6 weeks. Recommend calcium supplementation.   B12 deficiency anemia continue  B12 injection monthly  Frequent urination Possible BPH symptoms.  UA urine culture negative for infection.  Recommend trial of Flowmax, if no improvement, will refer to urology   Orders Placed This Encounter  Procedures   MR Brain W Wo Contrast    claustrophobia    Standing Status:   Future    Standing Expiration Date:   11/11/2022    Order Specific Question:   If indicated for the ordered procedure, I authorize the administration of contrast media per Radiology protocol    Answer:   Yes    Order Specific Question:   What is  the patient's sedation requirement?    Answer:   General Anesthesia (available ONLY at [facility])    Order Specific Question:   Does the patient have a pacemaker or implanted devices?    Answer:   No    Order Specific Question:   Use SRS Protocol?    Answer:   Yes    Order Specific Question:   Preferred imaging location?    Answer:   [facility] (table limit - 500 lbs)    Treatment today.   Lab MD Alimta Micheal Donovan. 3 weeks  All questions were answered. The patient knows to call the clinic with any problems, questions or concerns.  Micheal Server, MD, [name] [facility] Hematology Oncology 11/11/2021      CHIEF COMPLAINTS/REASON FOR VISIT:  non-small cell lung cancer  HISTORY OF PRESENTING ILLNESS:   Micheal Donovan is a  71 y.o.  male presents for follow up of Non-small cell lung cancer.  Oncology History Overview Note  Diagnosis: Stage IIB T2b N1 M0 adenocarcinoma of the LUL, poorly differentiated     Primary non-small cell carcinoma of upper lobe of left lung ([place])  06/13/2021 Initial Diagnosis   Primary non-small cell carcinoma of upper lobe of left lung [name]) -06/20/2021 - 06/27/2021, patient presented to [facility] due to progressive headache/dizziness/gait changes. CT head showed acute to subacute intraparenchymal hemorrhage involving the left cerebellum.  Surrounding low-density vasogenic edema.  Patient was transferred to [facility]. This was further evaluated by CT angiogram of the neck which showed bulky  calcified plaques/stenosis of carotid artery, Followed by MRI brain. 06/12/2021, MRI of the brain showed no significant interval change in size of the left cerebellar intraparenchymal hematoma with unchanged regional mass effect and partial effacement of fourth ventricle but no upstream hydrocephalus. There is no discernible enhancement to suggest underlying mass lesion, though acute blood products could mask enhancement.   06/12/2021 a chest x-ray showed a 4.4 cm  left middle lobe. 06/13/2021, CT chest with contrast showed a 4.3 x 3.6 x 3.3 cm lobular spiculated mass in the posterior left upper lobe with T3 to the lateral pleura and major fissure.  Metastatic left hilar lymphadenopathy.  Peripheral micronodularity posterior right costophrenic sulcus.  Aortic atherosclerosis. 06/14/2021, CT abdomen pelvis showed right lower lobe pulmonary artery embolus.  No evidence of right heart strain.  No acute intra-abdominal or pelvic pathology.  Aortic atherosclerosis.  06/13/2021, patient underwent bronchoscopy with EBUS by Dr. Tamala Julian.  Biopsy from the fine-needle aspiration of station 11 mL showed malignant cells, consistent with poorly differentiated non-small cell carcinoma, consistent with adenocarcinoma.  Malignant cells are TTF-1 positive and negative for p40.  Negative for neuroendocrine markers.  Blood test and tissue sample were tested for Gardant 360  -PD-L1 TPS 97%, no actionable mutation on the blood testing. 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   07/17/2021 Cancer Staging   Staging form: Lung, AJCC 8th Edition - Clinical: Stage IV (cT2b, cN1, cM1) - Signed by Micheal Server, MD on 08/06/2021   08/06/2021 Imaging   I saw patient's PET scan after his visit with me on 08/06/21. PET scan was ordered by his previous oncologist group and did not come to my in basket.. Patient received cycle 1 carboplatin Taxol today.  He has started on radiation.  5/26/3 PET scan showed 3.8 cm hypermetabolic left upper lobe mass with hypermetabolic left hilar and infrahilar adenopathy.  There is approximately 8 scattered metastatic lesions in the skeleton. Mixed density photopenic lesion in the left cerebellum. characterized as hemorrhage on recent prior imaging workups.    08/06/2021 - 08/06/2021 Chemotherapy   Patient is on Treatment Plan : LUNG Carboplatin / Paclitaxel + XRT q7d     08/16/2021 -  Chemotherapy   Switched to systemic chemotherapy with  Carboplatin (4.5) +  Pemetrexed (400) + Pembrolizumab (200) D1 q21d Induction x 4 cycles      08/16/2021 -  Chemotherapy   Patient is on Treatment Plan : LUNG Carboplatin (5) + Pemetrexed (500) + Pembrolizumab (200) D1 q21d Induction x 4 cycles / Maintenance Pemetrexed (500) + Pembrolizumab (200) D1 q21d     08/22/2021 Imaging   MRI brain w wo contrast  Decrease in size of left cerebellar hematoma with resolution of edema. No evidence of underlying lesion. Smaller, more recent hemorrhage in the left cerebellar vermis with minimal edema. There is minimal enhancement without definite evidence of underlying lesion.  New punctate focus of chronic blood products and enhancement in the left frontal lobe. Additional new foci of chronic blood products in the posterior right putamen, right parietal subcortical white matter, and posterior right cerebellum. Unclear at this time if these represent foci of bland hemorrhage or early metastases.   Increase in size of right parietal osseous metastasis with minor extraosseous extension.    10/22/2021 - 10/22/2021 Chemotherapy   Patient is on Treatment Plan : LUNG Carboplatin (5) + Pemetrexed (500) + Pembrolizumab (200) D1 q21d Induction x 4 cycles / Maintenance Pemetrexed (500) + Pembrolizumab (200) D1 q21d  Imaging   PET scan showed 1. Interval response to therapy as evidenced by a small residual left upper lobe nodule with decreased hypermetabolism, no residual hypermetabolic adenopathy and decreased hypermetabolism associatedwith osseous metastases. 2. 6 mm posterior left upper lobe nodule, likely stable. Recommend attention on follow-up. 3. Aortic atherosclerosis (ICD10-I70.0). Coronary artery calcification.   # Patient has a history of melanoma on his neck, treated in 2009. # History of hemorrhagic infarct left cerebellum  INTERVAL HISTORY MUADH CREASY is a 71 y.o. male who has above history reviewed by me today presents for follow up visit for management of  Stage IV  lung adenocarcianoma Patient has gained weight.  Overall tolerates chemotherapy. # Pulmonary embolism, SOB is stable, not worse.  He tolerates chemotherapy. + fatigue  after last treatment, no nausea vomiting diarrhea.  + back pain + frequent urination, small amount of urine. No dysuria, flank pain.   Review of Systems  Constitutional:  Positive for fatigue. Negative for appetite change, chills, fever and unexpected weight change.  HENT:   Negative for hearing loss and voice change.   Eyes:  Negative for eye problems and icterus.  Respiratory:  Positive for shortness of breath. Negative for chest tightness and cough.   Cardiovascular:  Negative for chest pain and leg swelling.  Gastrointestinal:  Negative for abdominal distention, abdominal pain and blood in stool.  Endocrine: Negative for hot flashes.  Genitourinary:  Positive for frequency. Negative for difficulty urinating and dysuria.   Musculoskeletal:  Negative for arthralgias.  Skin:  Negative for itching and rash.  Neurological:  Negative for extremity weakness, headaches, light-headedness and numbness.  Hematological:  Negative for adenopathy. Does not bruise/bleed easily.  Psychiatric/Behavioral:  Negative for confusion.     MEDICAL HISTORY:  Past Medical History:  Diagnosis Date   Allergy    BPH (benign prostatic hypertrophy)    Cancer (HCC)    Melanoma on Neck    2008   Carotid artery occlusion    Diabetes mellitus    type 2   ED (erectile dysfunction)    GERD (gastroesophageal reflux disease)    Hemorrhagic stroke ([place]) 06/2021   Hyperlipidemia    Hypertension    Lung cancer ([place])    Retinopathy due to secondary DM ([place])     SURGICAL HISTORY: Past Surgical History:  Procedure Laterality Date   BRONCHIAL NEEDLE ASPIRATION BIOPSY  06/17/2021   Procedure: BRONCHIAL NEEDLE ASPIRATION BIOPSIES;  Surgeon: Candee Furbish, MD;  Location: [facility] ENDOSCOPY;  Service: Pulmonary;;   IR IMAGING GUIDED PORT INSERTION   08/05/2021   MELANOMA EXCISION  2008   Left side of neck   RADIOLOGY WITH ANESTHESIA N/A 04/05/2020   Procedure: MRI SPINE WITOUT CONTRAST;  Surgeon: Radiologist, Medication, MD;  Location: [place];  Service: Radiology;  Laterality: N/A;   RADIOLOGY WITH ANESTHESIA N/A 08/22/2021   Procedure: MRI BRAIN WITH AND WITHOUT CONTRAST  WITH ANESTHESIA;  Surgeon: Radiologist, Medication, MD;  Location: [place];  Service: Radiology;  Laterality: N/A;   TONSILLECTOMY     VIDEO BRONCHOSCOPY WITH ENDOBRONCHIAL ULTRASOUND N/A 06/17/2021   Procedure: VIDEO BRONCHOSCOPY WITH ENDOBRONCHIAL ULTRASOUND;  Surgeon: Candee Furbish, MD;  Location: [facility] ENDOSCOPY;  Service: Pulmonary;  Laterality: N/A;    SOCIAL HISTORY: Social History   Socioeconomic History   Marital status: Married    Spouse name: Not on file   Number of children: Not on file   Years of education: Not on file   Highest education level: Not on  file  Occupational History   Not on file  Tobacco Use   Smoking status: Former    Types: Cigarettes    Quit date: 07/21/1990    Years since quitting: 31.3   Smokeless tobacco: Never  Vaping Use   Vaping Use: Never used  Substance and Sexual Activity   Alcohol use: No   Drug use: No   Sexual activity: Not on file  Other Topics Concern   Not on file  Social History Narrative   Not on file   Social Determinants of Health   Financial Resource Strain: Low Risk  (07/31/2021)   Overall Financial Resource Strain (CARDIA)    Difficulty of Paying Living Expenses: Not very hard  Food Insecurity: No Food Insecurity (07/31/2021)   Hunger Vital Sign    Worried About Running Out of Food in the Last Year: Never true    Ran Out of Food in the Last Year: Never true  Transportation Needs: No Transportation Needs (07/31/2021)   PRAPARE - Hydrologist (Medical): No    Lack of Transportation (Non-Medical): No  Physical Activity: Inactive (07/31/2021)   Exercise Vital Sign    Days of  Exercise per Week: 0 days    Minutes of Exercise per Session: 0 min  Stress: Unknown (07/31/2021)   [place]    Feeling of Stress : Patient refused  Social Connections: Unknown (07/31/2021)   Social Connection and Isolation Panel [NHANES]    Frequency of Communication with Friends and Family: Three times a week    Frequency of Social Gatherings with Friends and Family: Three times a week    Attends Religious Services: Patient refused    Active Member of Clubs or Organizations: Patient refused    Attends Archivist Meetings: Patient refused    Marital Status: Married  Human resources officer Violence: Not At Risk (07/31/2021)   Humiliation, Afraid, Rape, and Kick questionnaire    Fear of Current or Ex-Partner: No    Emotionally Abused: No    Physically Abused: No    Sexually Abused: No    FAMILY HISTORY: Family History  Problem Relation Age of Onset   COPD Mother    Heart disease Father    Heart disease Brother        MI at age 34   Stroke Neg Hx     ALLERGIES:  is allergic to niaspan [niacin].  MEDICATIONS:  Current Outpatient Medications  Medication Sig Dispense Refill   amLODipine (NORVASC) 10 MG tablet TAKE 1 TABLET(10 MG) BY MOUTH DAILY 90 tablet 0   apixaban (ELIQUIS) 2.5 MG TABS tablet Take 1 tablet (2.5 mg total) by mouth 2 (two) times daily. 60 tablet 3   atorvastatin (LIPITOR) 40 MG tablet TAKE 1 TABLET(40 MG) BY MOUTH DAILY 90 tablet 2   calcium-vitamin D (OSCAL WITH D) 500-5 MG-MCG tablet Take 2 tablets by mouth daily. 60 tablet 3   collagenase (SANTYL) 250 UNIT/GM ointment Apply 1 Application topically daily. 15 g 0   dexamethasone (DECADRON) 4 MG tablet Take 1 tab every 12 hours the day before pemetrexed chemo, then take 2 tabs once a day for 3 days starting the day after carboplatin. 30 tablet 1   folic acid (FOLVITE) 1 MG tablet Take 1 tablet (1 mg total) by mouth daily. Start 7 days before  pemetrexed chemotherapy. Continue until 21 days after pemetrexed completed. 100 tablet 3   insulin glargine (LANTUS) 100 UNIT/ML  Solostar Pen Inject 25 Units into the skin daily. (Patient taking differently: Inject 15-30 Units into the skin daily as needed (high blood sugar).) 15 mL 11   losartan (COZAAR) 50 MG tablet TAKE 1 TABLET(50 MG) BY MOUTH DAILY 90 tablet 0   metoprolol succinate (TOPROL-XL) 25 MG 24 hr tablet Take 0.5 tablets (12.5 mg total) by mouth daily. 30 tablet 0   NOVOLOG FLEXPEN 100 UNIT/ML FlexPen Inject into the skin.     omeprazole (PRILOSEC OTC) 20 MG tablet Take 20 mg by mouth daily.     ONETOUCH ULTRA test strip      oxyCODONE-acetaminophen (PERCOCET) 7.5-325 MG tablet Take 1 tablet by mouth every 4 (four) hours as needed for severe pain. 60 tablet 0   pantoprazole (PROTONIX) 40 MG tablet Take 1 tablet (40 mg total) by mouth daily at 6 (six) AM. 90 tablet 0   potassium chloride (KLOR-CON M) 10 MEQ tablet Take 1 tablet (10 mEq total) by mouth daily. 30 tablet 0   tamsulosin (FLOMAX) 0.4 MG CAPS capsule Take 1 capsule (0.4 mg total) by mouth daily. 30 capsule 0   topiramate (TOPAMAX) 100 MG tablet TAKE 1 TABLET(100 MG) BY MOUTH AT BEDTIME 30 tablet 0   traZODone (DESYREL) 50 MG tablet Take 1 tablet (50 mg total) by mouth at bedtime as needed for sleep. 30 tablet 1   UNABLE TO FIND PLEASE CHECK FASTING BLOOD GLUCOSE EVERY MORNING 1 each 0   LORazepam (ATIVAN) 0.5 MG tablet Take 1 tablet (0.5 mg total) by mouth See admin instructions. Take 1 tablet 30 minutes prior to imaging procedure, may take another tablet if needed.Avoid driving after taking medication. 2 tablet 0   No current facility-administered medications for this visit.   Facility-Administered Medications Ordered in Other Visits  Medication Dose Route Frequency Provider Last Rate Last Admin   heparin lock flush 100 unit/mL  500 Units Intracatheter Once PRN Micheal Server, MD       pembrolizumab [facility]) 200 mg in sodium  chloride 0.9 % 50 mL chemo infusion  200 mg Intravenous Once Micheal Server, MD       PEMEtrexed (ALIMTA) 700 mg in sodium chloride 0.9 % 100 mL chemo infusion  400 mg/m2 (Order-Specific) Intravenous Once Micheal Server, MD         PHYSICAL EXAMINATION:  Vitals:   11/11/21 0903  BP: (!) 143/69  Pulse: 76  Resp: 18  Temp: (!) 96 F (35.6 C)  SpO2: 100%   Filed Weights   11/11/21 0903  Weight: 149 lb (67.6 kg)    Physical Exam Constitutional:      General: He is not in acute distress.    Comments: Patient sits in the wheelchair  HENT:     Head: Normocephalic and atraumatic.  Eyes:     General: No scleral icterus. Cardiovascular:     Rate and Rhythm: Normal rate and regular rhythm.     Heart sounds: Normal heart sounds.  Pulmonary:     Effort: Pulmonary effort is normal. No respiratory distress.     Breath sounds: No wheezing.     Comments: Decreased breath sound bilaterally. Abdominal:     General: Bowel sounds are normal. There is no distension.     Palpations: Abdomen is soft.  Musculoskeletal:        General: No deformity. Normal range of motion.     Cervical back: Normal range of motion and neck supple.  Skin:    General: Skin is warm and dry.  Findings: No erythema or rash.  Neurological:     Mental Status: He is alert and oriented to person, place, and time. Mental status is at baseline.     Cranial Nerves: No cranial nerve deficit.     Coordination: Coordination normal.  Psychiatric:        Mood and Affect: Mood normal.     LABORATORY DATA:  I have reviewed the data as listed Lab Results  Component Value Date   WBC 5.4 11/11/2021   HGB 8.2 (L) 11/11/2021   HCT 25.6 (L) 11/11/2021   MCV 95.9 11/11/2021   PLT 501 (H) 11/11/2021   Recent Labs    11/01/21 1315 11/05/21 1335 11/11/21 0840  NA 129* 131* 138  K 3.4* 3.5 3.0*  CL 96* 95* 104  CO2 $Re'24 25 26  'ZYP$ GLUCOSE 206* 165* 148*  BUN 12 9 7*  CREATININE 0.74 0.70 0.65  CALCIUM 8.1* 8.3* 8.4*  GFRNONAA  >60 >60 >60  PROT 6.8 6.8 6.7  ALBUMIN 3.1* 2.9* 2.8*  AST $Re'21 26 20  'eYM$ ALT $R'19 29 13  'lv$ ALKPHOS 91 90 79  BILITOT 0.2* 0.2* 0.3    Iron/TIBC/Ferritin/ %Sat    Component Value Date/Time   IRON 50 09/19/2021 0810   TIBC 305 09/19/2021 0810   FERRITIN 249 09/19/2021 0810   IRONPCTSAT 16 (L) 09/19/2021 0810      RADIOGRAPHIC STUDIES: I have personally reviewed the radiological images as listed and agreed with the findings in the report. NM PET Image Restag (PS) Skull Base To Thigh  Result Date: 10/30/2021 CLINICAL DATA:  Subsequent treatment strategy for lung cancer. EXAM: NUCLEAR MEDICINE PET SKULL BASE TO THIGH TECHNIQUE: 8.6 mCi F-18 FDG was injected intravenously. Full-ring PET imaging was performed from the skull base to thigh after the radiotracer. CT data was obtained and used for attenuation correction and anatomic localization. Fasting blood glucose: 147 mg/dl COMPARISON:  07/24/2021. FINDINGS: Mediastinal blood pool activity: SUV max 1.9 Liver activity: SUV max NA NECK: No abnormal hypermetabolism. Incidental CT findings: Retention cysts or polyps in the maxillary sinuses. CHEST: Left upper lobe mass has decreased in size and hypermetabolism, now measuring approximately 1.8 x 2.2 cm, SUV max 2.2, compared to 3.8 x 3.9 cm, SUV max 19.6. No hypermetabolic mediastinal, hilar or axillary lymph nodes. Incidental CT findings: Right IJ Port-A-Cath terminates in the right atrium. Atherosclerotic calcification of the aorta and coronary arteries. Heart is at the upper limits of normal in size. No pericardial effusion. 6 mm posterior left upper lobe nodule (2/68), likely unchanged and too small for PET resolution. ABDOMEN/PELVIS: No abnormal hypermetabolism in the liver, adrenal glands, spleen or pancreas. No hypermetabolic lymph nodes. Incidental CT findings: Liver, gallbladder adrenal glands are unremarkable. Subcentimeter low-attenuation lesion in the interpolar right kidney, too small to  characterize. No specific follow-up necessary. Kidneys, spleen, pancreas, stomach and bowel are otherwise grossly unremarkable. Atherosclerotic calcification of the aorta. SKELETON: Interval decrease in hypermetabolism associated with bone metastases. Index lytic lesion in the right iliac wing has decreased to SUV max 2.1, from 15.9. Incidental CT findings: Degenerative changes in the spine. Healing pathologic fracture of the lateral left seventh rib. IMPRESSION: 1. Interval response to therapy as evidenced by a small residual left upper lobe nodule with decreased hypermetabolism, no residual hypermetabolic adenopathy and decreased hypermetabolism associated with osseous metastases. 2. 6 mm posterior left upper lobe nodule, likely stable. Recommend attention on follow-up. 3. Aortic atherosclerosis (ICD10-I70.0). Coronary artery calcification. Electronically Signed   By: Lorin Picket  M.D.   On: 10/30/2021 08:41

## 2021-11-11 NOTE — Assessment & Plan Note (Addendum)
Back pain, will see Dr.Chrystal today for evaluation of RT  PRN percocet

## 2021-11-11 NOTE — Assessment & Plan Note (Signed)
Recommend Zometa every 4- 6 weeks. Recommend calcium supplementation.

## 2021-11-11 NOTE — Assessment & Plan Note (Signed)
Possible BPH symptoms.  UA urine culture negative for infection.  Recommend trial of Flowmax, if no improvement, will refer to urology

## 2021-11-11 NOTE — Assessment & Plan Note (Signed)
continue  B12 injection monthly

## 2021-11-12 ENCOUNTER — Other Ambulatory Visit: Payer: Self-pay

## 2021-11-12 MED ORDER — TAMSULOSIN HCL 0.4 MG PO CAPS: 0.4000 mg | ORAL_CAPSULE | Freq: Every day | ORAL | 0 refills | Status: DC

## 2021-11-13 ENCOUNTER — Other Ambulatory Visit: Payer: Self-pay | Admitting: *Deleted

## 2021-11-13 MED ORDER — OXYCODONE-ACETAMINOPHEN 7.5-325 MG PO TABS: 1.0000 | ORAL_TABLET | ORAL | 0 refills | Status: DC | PRN

## 2021-11-17 ENCOUNTER — Other Ambulatory Visit: Payer: Self-pay | Admitting: Physical Medicine and Rehabilitation

## 2021-11-17 ENCOUNTER — Other Ambulatory Visit: Payer: Self-pay | Admitting: Oncology

## 2021-11-18 ENCOUNTER — Other Ambulatory Visit: Payer: Self-pay

## 2021-11-18 ENCOUNTER — Telehealth: Payer: Self-pay | Admitting: *Deleted

## 2021-11-18 ENCOUNTER — Emergency Department: Payer: PPO

## 2021-11-18 ENCOUNTER — Ambulatory Visit: Admission: RE | Admit: 2021-11-18 | Payer: PPO | Source: Ambulatory Visit

## 2021-11-18 ENCOUNTER — Encounter: Payer: Self-pay | Admitting: *Deleted

## 2021-11-18 ENCOUNTER — Encounter: Payer: Self-pay | Admitting: Oncology

## 2021-11-18 ENCOUNTER — Inpatient Hospital Stay
Admission: EM | Admit: 2021-11-18 | Discharge: 2021-11-20 | DRG: 391 | Disposition: A | Discharge status: Home / Self Care | Payer: PPO | Attending: Internal Medicine | Admitting: Internal Medicine

## 2021-11-18 ENCOUNTER — Other Ambulatory Visit: Payer: PPO

## 2021-11-18 DIAGNOSIS — D709 Neutropenia, unspecified: Secondary | ICD-10-CM | POA: Diagnosis not present

## 2021-11-18 DIAGNOSIS — C7931 Secondary malignant neoplasm of brain: Secondary | ICD-10-CM | POA: Diagnosis not present

## 2021-11-18 DIAGNOSIS — K219 Gastro-esophageal reflux disease without esophagitis: Secondary | ICD-10-CM | POA: Diagnosis present

## 2021-11-18 DIAGNOSIS — I4510 Unspecified right bundle-branch block: Secondary | ICD-10-CM | POA: Diagnosis present

## 2021-11-18 DIAGNOSIS — Z888 Allergy status to other drugs, medicaments and biological substances status: Secondary | ICD-10-CM

## 2021-11-18 DIAGNOSIS — B348 Other viral infections of unspecified site: Secondary | ICD-10-CM | POA: Diagnosis not present

## 2021-11-18 DIAGNOSIS — I2699 Other pulmonary embolism without acute cor pulmonale: Secondary | ICD-10-CM | POA: Diagnosis not present

## 2021-11-18 DIAGNOSIS — T451X5A Adverse effect of antineoplastic and immunosuppressive drugs, initial encounter: Secondary | ICD-10-CM | POA: Diagnosis not present

## 2021-11-18 DIAGNOSIS — Z20822 Contact with and (suspected) exposure to covid-19: Secondary | ICD-10-CM | POA: Diagnosis not present

## 2021-11-18 DIAGNOSIS — Z87891 Personal history of nicotine dependence: Secondary | ICD-10-CM | POA: Diagnosis not present

## 2021-11-18 DIAGNOSIS — R531 Weakness: Secondary | ICD-10-CM | POA: Diagnosis not present

## 2021-11-18 DIAGNOSIS — C7951 Secondary malignant neoplasm of bone: Secondary | ICD-10-CM | POA: Diagnosis not present

## 2021-11-18 DIAGNOSIS — Z8249 Family history of ischemic heart disease and other diseases of the circulatory system: Secondary | ICD-10-CM | POA: Diagnosis not present

## 2021-11-18 DIAGNOSIS — E11319 Type 2 diabetes mellitus with unspecified diabetic retinopathy without macular edema: Secondary | ICD-10-CM | POA: Diagnosis present

## 2021-11-18 DIAGNOSIS — I10 Essential (primary) hypertension: Secondary | ICD-10-CM

## 2021-11-18 DIAGNOSIS — K529 Noninfective gastroenteritis and colitis, unspecified: Principal | ICD-10-CM

## 2021-11-18 DIAGNOSIS — R5081 Fever presenting with conditions classified elsewhere: Secondary | ICD-10-CM | POA: Diagnosis not present

## 2021-11-18 DIAGNOSIS — R1030 Lower abdominal pain, unspecified: Secondary | ICD-10-CM | POA: Diagnosis not present

## 2021-11-18 DIAGNOSIS — D649 Anemia, unspecified: Secondary | ICD-10-CM | POA: Diagnosis not present

## 2021-11-18 DIAGNOSIS — Z794 Long term (current) use of insulin: Secondary | ICD-10-CM

## 2021-11-18 DIAGNOSIS — E119 Type 2 diabetes mellitus without complications: Secondary | ICD-10-CM | POA: Diagnosis not present

## 2021-11-18 DIAGNOSIS — R109 Unspecified abdominal pain: Secondary | ICD-10-CM | POA: Diagnosis not present

## 2021-11-18 DIAGNOSIS — N401 Enlarged prostate with lower urinary tract symptoms: Secondary | ICD-10-CM | POA: Diagnosis not present

## 2021-11-18 DIAGNOSIS — C3412 Malignant neoplasm of upper lobe, left bronchus or lung: Secondary | ICD-10-CM | POA: Diagnosis not present

## 2021-11-18 DIAGNOSIS — B9789 Other viral agents as the cause of diseases classified elsewhere: Secondary | ICD-10-CM | POA: Diagnosis present

## 2021-11-18 DIAGNOSIS — Z7901 Long term (current) use of anticoagulants: Secondary | ICD-10-CM

## 2021-11-18 DIAGNOSIS — D6481 Anemia due to antineoplastic chemotherapy: Secondary | ICD-10-CM | POA: Diagnosis present

## 2021-11-18 DIAGNOSIS — E876 Hypokalemia: Secondary | ICD-10-CM | POA: Diagnosis not present

## 2021-11-18 DIAGNOSIS — Z79899 Other long term (current) drug therapy: Secondary | ICD-10-CM

## 2021-11-18 DIAGNOSIS — E785 Hyperlipidemia, unspecified: Secondary | ICD-10-CM

## 2021-11-18 DIAGNOSIS — Z95828 Presence of other vascular implants and grafts: Secondary | ICD-10-CM

## 2021-11-18 DIAGNOSIS — Z825 Family history of asthma and other chronic lower respiratory diseases: Secondary | ICD-10-CM

## 2021-11-18 DIAGNOSIS — D638 Anemia in other chronic diseases classified elsewhere: Secondary | ICD-10-CM

## 2021-11-18 LAB — CBC WITH DIFFERENTIAL/PLATELET
Basophils Absolute: 0 10*3/uL (ref 0.0–0.1)
Basophils Relative: 0 %
Eosinophils Absolute: 0 10*3/uL (ref 0.0–0.5)
Eosinophils Relative: 0 %
HCT: 27.9 % — ABNORMAL LOW (ref 39.0–52.0)
Hemoglobin: 8.8 g/dL — ABNORMAL LOW (ref 13.0–17.0)
Lymphocytes Relative: 15 %
Lymphs Abs: 0.3 10*3/uL — ABNORMAL LOW (ref 0.7–4.0)
MCH: 30 pg (ref 26.0–34.0)
MCHC: 31.5 g/dL (ref 30.0–36.0)
MCV: 95.2 fL (ref 80.0–100.0)
Monocytes Absolute: 0.2 10*3/uL (ref 0.1–1.0)
Monocytes Relative: 14 %
Neutro Abs: 0.9 10*3/uL — ABNORMAL LOW (ref 1.7–7.7)
Neutrophils Relative %: 51 %
Platelets: 298 10*3/uL (ref 150–400)
RBC: 2.93 MIL/uL — ABNORMAL LOW (ref 4.22–5.81)
RDW: 15.4 % (ref 11.5–15.5)
WBC: 1.7 10*3/uL — ABNORMAL LOW (ref 4.0–10.5)
nRBC: 0 % (ref 0.0–0.2)
nRBC: 0 /100 WBC

## 2021-11-18 LAB — BASIC METABOLIC PANEL
Anion gap: 11 (ref 5–15)
BUN: 10 mg/dL (ref 8–23)
CO2: 23 mmol/L (ref 22–32)
Calcium: 8.4 mg/dL — ABNORMAL LOW (ref 8.9–10.3)
Chloride: 99 mmol/L (ref 98–111)
Creatinine, Ser: 0.67 mg/dL (ref 0.61–1.24)
GFR, Estimated: >60 mL/min (ref >60)
Glucose, Bld: 165 mg/dL — ABNORMAL HIGH (ref 70–99)
Potassium: 3.7 mmol/L (ref 3.5–5.1)
Sodium: 133 mmol/L — ABNORMAL LOW (ref 135–145)

## 2021-11-18 LAB — TROPONIN I (HIGH SENSITIVITY)
Troponin I (High Sensitivity): 33 ng/L — ABNORMAL HIGH (ref <18)
Troponin I (High Sensitivity): 28 ng/L — ABNORMAL HIGH (ref <18)

## 2021-11-18 LAB — CBC
HCT: 27.5 % — ABNORMAL LOW (ref 39.0–52.0)
Hemoglobin: 8.7 g/dL — ABNORMAL LOW (ref 13.0–17.0)
MCH: 30.1 pg (ref 26.0–34.0)
MCHC: 31.6 g/dL (ref 30.0–36.0)
MCV: 95.2 fL (ref 80.0–100.0)
Platelets: 303 10*3/uL (ref 150–400)
RBC: 2.89 MIL/uL — ABNORMAL LOW (ref 4.22–5.81)
RDW: 15.3 % (ref 11.5–15.5)
WBC: 1.7 10*3/uL — ABNORMAL LOW (ref 4.0–10.5)
nRBC: 0 % (ref 0.0–0.2)

## 2021-11-18 LAB — SARS CORONAVIRUS 2 BY RT PCR: SARS Coronavirus 2 by RT PCR: NEGATIVE

## 2021-11-18 LAB — LACTIC ACID, PLASMA
Lactic Acid, Venous: 1 mmol/L (ref 0.5–1.9)
Lactic Acid, Venous: 0.7 mmol/L (ref 0.5–1.9)

## 2021-11-18 MED ORDER — APIXABAN 2.5 MG PO TABS
2.5000 mg | ORAL_TABLET | Freq: Two times a day (BID) | ORAL | Status: DC
  Administered 2021-11-19 – 2021-11-20 (×3): 2.5 mg via ORAL
  Filled 2021-11-18 (×4): qty 1

## 2021-11-18 MED ORDER — POTASSIUM CHLORIDE CRYS ER 10 MEQ PO TBCR: 10.0000 meq | EXTENDED_RELEASE_TABLET | Freq: Every day | ORAL | Status: DC

## 2021-11-18 MED ORDER — OXYCODONE-ACETAMINOPHEN 7.5-325 MG PO TABS
1.0000 | ORAL_TABLET | ORAL | Status: DC | PRN
  Administered 2021-11-18 – 2021-11-19 (×2): 1 via ORAL
  Filled 2021-11-18 (×2): qty 1

## 2021-11-18 MED ORDER — SODIUM CHLORIDE 0.9 % IV SOLN: 2.0000 g | Freq: Once | INTRAVENOUS | Status: DC

## 2021-11-18 MED ORDER — OYSTER SHELL CALCIUM/D3 500-5 MG-MCG PO TABS
2.0000 | ORAL_TABLET | Freq: Every day | ORAL | Status: DC
  Administered 2021-11-19 – 2021-11-20 (×2): 2 via ORAL
  Filled 2021-11-18 (×2): qty 2

## 2021-11-18 MED ORDER — SODIUM CHLORIDE 0.9 % IV SOLN: 2.0000 g | Freq: Three times a day (TID) | INTRAVENOUS | Status: DC

## 2021-11-18 MED ORDER — OXYCODONE-ACETAMINOPHEN 7.5-325 MG PO TABS: 1.0000 | ORAL_TABLET | ORAL | Status: DC | PRN

## 2021-11-18 MED ORDER — ONDANSETRON HCL 4 MG/2ML IJ SOLN: 4.0000 mg | Freq: Four times a day (QID) | INTRAMUSCULAR | Status: DC | PRN

## 2021-11-18 MED ORDER — SODIUM CHLORIDE 0.9 % IV SOLN
2.0000 g | Freq: Three times a day (TID) | INTRAVENOUS | Status: DC
  Administered 2021-11-18: 2 g via INTRAVENOUS
  Filled 2021-11-18: qty 12.5

## 2021-11-18 MED ORDER — SODIUM CHLORIDE 0.9 % IV SOLN: INTRAVENOUS | Status: DC

## 2021-11-18 MED ORDER — INSULIN GLARGINE-YFGN 100 UNIT/ML ~~LOC~~ SOLN
25.0000 [IU] | Freq: Every day | SUBCUTANEOUS | Status: DC
  Filled 2021-11-18 (×2): qty 0.25

## 2021-11-18 MED ORDER — METOPROLOL SUCCINATE ER 25 MG PO TB24: 12.5000 mg | ORAL_TABLET | Freq: Every day | ORAL | Status: DC

## 2021-11-18 MED ORDER — LORAZEPAM 0.5 MG PO TABS: 0.5000 mg | ORAL_TABLET | ORAL | Status: DC

## 2021-11-18 MED ORDER — TRAZODONE HCL 50 MG PO TABS: 25.0000 mg | ORAL_TABLET | Freq: Every evening | ORAL | Status: DC | PRN

## 2021-11-18 MED ORDER — ATORVASTATIN CALCIUM 20 MG PO TABS
40.0000 mg | ORAL_TABLET | Freq: Every day | ORAL | Status: DC
  Filled 2021-11-18: qty 2

## 2021-11-18 MED ORDER — IOHEXOL 300 MG/ML  SOLN
100.0000 mL | Freq: Once | INTRAMUSCULAR | Status: AC | PRN
  Administered 2021-11-18: 100 mL via INTRAVENOUS

## 2021-11-18 MED ORDER — TAMSULOSIN HCL 0.4 MG PO CAPS
0.4000 mg | ORAL_CAPSULE | Freq: Every day | ORAL | Status: DC
  Administered 2021-11-19 – 2021-11-20 (×2): 0.4 mg via ORAL
  Filled 2021-11-18 (×2): qty 1

## 2021-11-18 MED ORDER — AMLODIPINE BESYLATE 10 MG PO TABS
10.0000 mg | ORAL_TABLET | Freq: Every day | ORAL | Status: DC
  Filled 2021-11-18: qty 1

## 2021-11-18 MED ORDER — VANCOMYCIN HCL 750 MG/150ML IV SOLN
750.0000 mg | Freq: Two times a day (BID) | INTRAVENOUS | Status: DC
  Administered 2021-11-19 – 2021-11-20 (×2): 750 mg via INTRAVENOUS
  Filled 2021-11-18 (×3): qty 150

## 2021-11-18 MED ORDER — VANCOMYCIN HCL 1500 MG/300ML IV SOLN
1500.0000 mg | Freq: Once | INTRAVENOUS | Status: AC
  Administered 2021-11-19: 1500 mg via INTRAVENOUS
  Filled 2021-11-18 (×2): qty 300

## 2021-11-18 MED ORDER — PANTOPRAZOLE SODIUM 40 MG PO TBEC
40.0000 mg | DELAYED_RELEASE_TABLET | Freq: Every day | ORAL | Status: DC
  Administered 2021-11-19: 40 mg via ORAL
  Filled 2021-11-18: qty 1

## 2021-11-18 MED ORDER — SODIUM CHLORIDE 0.9 % IV BOLUS
500.0000 mL | Freq: Once | INTRAVENOUS | Status: AC
  Administered 2021-11-18: 500 mL via INTRAVENOUS

## 2021-11-18 MED ORDER — ACETAMINOPHEN 650 MG RE SUPP: 650.0000 mg | Freq: Four times a day (QID) | RECTAL | Status: DC | PRN

## 2021-11-18 MED ORDER — TRAZODONE HCL 50 MG PO TABS: 50.0000 mg | ORAL_TABLET | Freq: Every evening | ORAL | Status: DC | PRN

## 2021-11-18 MED ORDER — VANCOMYCIN HCL IN DEXTROSE 1-5 GM/200ML-% IV SOLN: 1000.0000 mg | Freq: Once | INTRAVENOUS | Status: DC

## 2021-11-18 MED ORDER — TOPIRAMATE 100 MG PO TABS
100.0000 mg | ORAL_TABLET | Freq: Every day | ORAL | Status: DC
  Filled 2021-11-18 (×2): qty 1

## 2021-11-18 MED ORDER — MAGNESIUM HYDROXIDE 400 MG/5ML PO SUSP: 30.0000 mL | Freq: Every day | ORAL | Status: DC | PRN

## 2021-11-18 MED ORDER — ONDANSETRON HCL 4 MG PO TABS: 4.0000 mg | ORAL_TABLET | Freq: Four times a day (QID) | ORAL | Status: DC | PRN

## 2021-11-18 MED ORDER — ACETAMINOPHEN 325 MG PO TABS: 650.0000 mg | ORAL_TABLET | Freq: Four times a day (QID) | ORAL | Status: DC | PRN

## 2021-11-18 MED ORDER — LOSARTAN POTASSIUM 50 MG PO TABS: 50.0000 mg | ORAL_TABLET | Freq: Every day | ORAL | Status: DC

## 2021-11-18 MED ORDER — SODIUM CHLORIDE 0.9 % IV SOLN
2.0000 g | Freq: Three times a day (TID) | INTRAVENOUS | Status: DC
  Administered 2021-11-19 – 2021-11-20 (×4): 2 g via INTRAVENOUS
  Filled 2021-11-18 (×3): qty 12.5
  Filled 2021-11-18: qty 2
  Filled 2021-11-18: qty 12.5

## 2021-11-18 MED ORDER — FOLIC ACID 1 MG PO TABS
1.0000 mg | ORAL_TABLET | Freq: Every day | ORAL | Status: DC
  Filled 2021-11-18: qty 1

## 2021-11-18 MED ORDER — METRONIDAZOLE 500 MG/100ML IV SOLN
500.0000 mg | Freq: Two times a day (BID) | INTRAVENOUS | Status: DC
  Administered 2021-11-18: 500 mg via INTRAVENOUS
  Filled 2021-11-18: qty 100

## 2021-11-18 NOTE — Assessment & Plan Note (Signed)
-   She will be placed on supplement coverage with NovoLog and will continue his basal coverage.

## 2021-11-18 NOTE — Telephone Encounter (Signed)
Call from [place], daughter stating that she is getting second hand info from patient wife that he is running a temp of 100.8, not eating or getting out of bed. He has abdominal cramping, she does not know if he is drinking liquids or not. She states that over the weekend, he had diarrhea, but not today.Does not think he has any N/V She said that if we can see him today. It would take an hour to get him to office as she will have to call her husband to go get him and bring him in. Please advise

## 2021-11-18 NOTE — Assessment & Plan Note (Signed)
-   The patient will be continued on his antihypertensives.

## 2021-11-18 NOTE — Telephone Encounter (Signed)
I called patient per Dr Tasia Catchings request who acknowledged that all his daughter said is true and then I informed him that per APP's and Dr Tasia Catchings that he should go to ER for evaluation an dnot wait for an appointment tomorrow. I let him know that [name] had said her husband can come an dget him to where he needs to go and he asked that I call [name] and have her arrange her husband to e him to ER. [name] informed and saifd "we will make that happen"

## 2021-11-18 NOTE — Assessment & Plan Note (Signed)
-  He is undergoing chemotherapy. - Oncology consult will be obtained.

## 2021-11-18 NOTE — ED Notes (Signed)
Pt took 2 extra strength tylenol at 1400 per family

## 2021-11-18 NOTE — Assessment & Plan Note (Signed)
-   The patient be admitted to a medical telemetry bed. - Will be placed on reverse isolation. - We will follow blood cultures. - We will continue antibiotic therapy with IV vancomycin, Flagyl and cefepime. - We will obtain an oncology consult. - I notified Dr. Janese Banks about the patient.

## 2021-11-18 NOTE — Assessment & Plan Note (Signed)
-   We will continue statin therapy. 

## 2021-11-18 NOTE — ED Triage Notes (Signed)
Pt reports he feels weak and has recent fevers.  No n/v  no chest pain or sob.    Hx bone and lung cancer.  Last chemo was last week.  Pt alert, speech clear.  Pt has a port.

## 2021-11-18 NOTE — ED Notes (Signed)
1st set of blood cx drawn in triage.

## 2021-11-18 NOTE — Assessment & Plan Note (Signed)
-   Given his neutropenic fever though he has no significant tenderness, I will still obtain abdominal and pelvic CT scan for further assessment.

## 2021-11-18 NOTE — ED Provider Notes (Signed)
[facility] Provider Note    Event Date/Time   First MD Initiated Contact with Patient 11/18/21 1732     (approximate)   History   Weakness and Fever   HPI  Micheal Donovan is a 71 y.o. male who presents to the ER for evaluation of fever generalized malaise and right lower quadrant pain.  Patient is currently undergoing chemotherapy for lung and bone cancer.  Was feeling unwell and generalized malaise earlier today was found to have temperature at home of 100.8.  Called cancer center was told to come to the ER due to concern for neutropenic fever.     Physical Exam   Triage Vital Signs: ED Triage Vitals [11/18/21 1614]  Enc Vitals Group     BP 116/69     Pulse Rate 95     Resp 20     Temp 98.6 F (37 C)     Temp Source Oral     SpO2 96 %     Weight 140 lb (63.5 kg)     Height 5\' 7"  (1.702 m)     Head Circumference      Peak Flow      Pain Score 6     Pain Loc      Pain Edu?      Excl. in [place]?     Most recent vital signs: Vitals:   11/18/21 1930 11/18/21 2035  BP:  132/71  Pulse: 80 87  Resp:  20  Temp:    SpO2: 100% 100%     Constitutional: Alert  Eyes: Conjunctivae are normal.  Head: Atraumatic. Nose: No congestion/rhinnorhea. Mouth/Throat: Mucous membranes are moist.   Neck: Painless ROM.  Cardiovascular:   Good peripheral circulation. Respiratory: Normal respiratory effort.  No retractions.  Gastrointestinal: Soft with mild ttp in rlq Musculoskeletal:  no deformity Neurologic:  MAE spontaneously. No gross focal neurologic deficits are appreciated.  Skin:  Skin is warm, dry and intact. No rash noted. Psychiatric: Mood and affect are normal. Speech and behavior are normal.    ED Results / Procedures / Treatments   Labs (all labs ordered are listed, but only abnormal results are displayed) Labs Reviewed  BASIC METABOLIC PANEL - Abnormal; Notable for the following components:      Result Value   Sodium 133 (*)     Glucose, Bld 165 (*)    Calcium 8.4 (*)    All other components within normal limits  CBC - Abnormal; Notable for the following components:   WBC 1.7 (*)    RBC 2.89 (*)    Hemoglobin 8.7 (*)    HCT 27.5 (*)    All other components within normal limits  CBC WITH DIFFERENTIAL/PLATELET - Abnormal; Notable for the following components:   WBC 1.7 (*)    RBC 2.93 (*)    Hemoglobin 8.8 (*)    HCT 27.9 (*)    Neutro Abs 0.9 (*)    Lymphs Abs 0.3 (*)    All other components within normal limits  TROPONIN I (HIGH SENSITIVITY) - Abnormal; Notable for the following components:   Troponin I (High Sensitivity) 33 (*)    All other components within normal limits  TROPONIN I (HIGH SENSITIVITY) - Abnormal; Notable for the following components:   Troponin I (High Sensitivity) 28 (*)    All other components within normal limits  SARS CORONAVIRUS 2 BY RT PCR  CULTURE, BLOOD (ROUTINE X 2)  CULTURE, BLOOD (ROUTINE X 2)  LACTIC ACID, PLASMA  LACTIC ACID, PLASMA  URINALYSIS, ROUTINE W REFLEX MICROSCOPIC     EKG  ED ECG REPORT I, Merlyn Lot, the attending physician, personally viewed and interpreted this ECG.   Date: 11/18/2021  EKG Time: 16:19  Rate: 95  Rhythm: sinus  Axis: normal  Intervals: rbbb  ST&T Change: no stemi, nonspecific st abn    RADIOLOGY Please see ED Course for my review and interpretation.  I personally reviewed all radiographic images ordered to evaluate for the above acute complaints and reviewed radiology reports and findings.  These findings were personally discussed with the patient.  Please see medical record for radiology report.    PROCEDURES:  Critical Care performed: Yes, see critical care procedure note(s)  .Critical Care  Performed by: Merlyn Lot, MD Authorized by: Merlyn Lot, MD   Critical care provider statement:    Critical care time (minutes):  20   Critical care was necessary to treat or prevent imminent or  life-threatening deterioration of the following conditions: neutropenic fever.   Critical care was time spent personally by me on the following activities:  Ordering and performing treatments and interventions, ordering and review of laboratory studies, ordering and review of radiographic studies, pulse oximetry, re-evaluation of patient's condition, review of old charts, obtaining history from patient or surrogate, examination of patient, evaluation of patient's response to treatment, discussions with primary provider, discussions with consultants and development of treatment plan with patient or surrogate    MEDICATIONS ORDERED IN ED: Medications  ceFEPIme (MAXIPIME) 2 g in sodium chloride 0.9 % 100 mL IVPB (0 g Intravenous Stopped 11/18/21 1901)  oxyCODONE-acetaminophen (PERCOCET) 7.5-325 MG per tablet 1 tablet (has no administration in time range)  sodium chloride 0.9 % bolus 500 mL (500 mLs Intravenous New Bag/Given 11/18/21 1850)  iohexol (OMNIPAQUE) 300 MG/ML solution 100 mL (100 mLs Intravenous Contrast Given 11/18/21 2016)     IMPRESSION / MDM / [place] / ED COURSE  I reviewed the triage vital signs and the nursing notes.                              Differential diagnosis includes, but is not limited to, sepsis, neutropenic fever, appendicitis, UTI, pyelonephritis, electrolyte abnormality, anemia  Patient presented to the ER for evaluation of symptoms as described above.  This presenting complaint could reflect a potentially life-threatening illness therefore the patient will be placed on continuous pulse oximetry and telemetry for monitoring.  Laboratory evaluation will be sent to evaluate for the above complaints.  CT imaging x-ray will be ordered for by differential.  Given presentation I am suspicious for neutropenic fever.  He is not meeting SIRS criteria or septic at this time.  He is neutropenic.  CT imaging on my review and interpretation does not show any evidence of  appendicitis.  Per radiology report is consistent with enteritis.  Patient received dose of IV cefepime as well as IV fluids.  I have consulted hospitalist for admission.      FINAL CLINICAL IMPRESSION(S) / ED DIAGNOSES   Final diagnoses:  Neutropenic fever ([place])     Rx / DC Orders   ED Discharge Orders     None        Note:  This document was prepared using Dragon voice recognition software and may include unintentional dictation errors.    Merlyn Lot, MD 11/18/21 2110

## 2021-11-18 NOTE — Assessment & Plan Note (Signed)
-   We will continue his Eliquis.

## 2021-11-18 NOTE — Progress Notes (Signed)
Pharmacy Antibiotic Note  Micheal Donovan is a 71 y.o. male admitted on 11/18/2021 with infection of unknown source.  Pharmacy has been consulted for Cefepime & Vancomycin dosing for 7 days.  Plan: Cefepime 2 gm q8hr per indication & renal fxn.  Pt given Vancomycin 1500 mg once. Vancomycin 750 mg IV Q 12 hrs. Goal AUC 400-550. Expected AUC: 485.8 SCr used: 0.8 (9/18 = 0.67),TBW 63.5 kg < IBW 66.1 kg  Pharmacy will continue to follow and will adjust abx dosing whenever warranted.  Height: 5\' 7"  (170.2 cm) Weight: 63.5 kg (140 lb) IBW/kg (Calculated) : 66.1  Temp (24hrs), Avg:99 F (37.2 C), Min:98.6 F (37 C), Max:99.3 F (37.4 C)   Recent Labs  Lab 11/18/21 1619 11/18/21 1757  WBC 1.7*  1.7*  --   CREATININE 0.67  --   LATICACIDVEN 1.0 0.7    Estimated Creatinine Clearance: 76.1 mL/min (by C-G formula based on SCr of 0.67 mg/dL).    Allergies  Allergen Reactions   Niaspan [Niacin]     FLUSHING    Antimicrobials this admission: 9/18 Cefepime >> x 7 days 9/19 Vancomycin >> x 7 days 9/19 Flagyl >> x 7 days  Microbiology results: 9/18 BCx: Pending  Thank you for allowing pharmacy to be a part of this patient's care.  Renda Rolls, PharmD, [facility] 11/18/2021 9:46 PM

## 2021-11-18 NOTE — H&P (Addendum)
[place]   PATIENT NAME: Micheal Donovan    MR#:  [id number]  DATE OF BIRTH:  23-Jun-1950  DATE OF ADMISSION:  11/18/2021  PRIMARY CARE PHYSICIAN: [name], MD   Patient is coming from: Home  REQUESTING/REFERRING PHYSICIAN: Merlyn Lot, MD  CHIEF COMPLAINT:   Chief Complaint  Patient presents with   Weakness   Fever    HISTORY OF PRESENT ILLNESS:  Micheal Donovan is a 71 y.o. Caucasian male with medical history significant for primary non-small cell lung cancer of the left upper lobe, PE, type 2 diabetes mellitus, essential hypertension, lumbar radiculopathy, history of melanoma, BPH, dyslipidemia and diabetic retinopathy, presented to the ER with acute onset of generalized weakness and fever at home with a temperature of 100.8 for which he was advised to come to the ER by his cancer center staff was concerned about neutropenic fever.  The patient has been undergoing chemotherapy for lung cancer with bone metastasis.  He was having malaise and was feeling unwell with this temperature.  He admits to lower abdominal pain since yesterday that has been worse today.  He has been having urinary urgency frequency or dysuria or hematuria or flank pain.  No chest pain or palpitations.  No nausea or vomiting.  No cough or wheezing or dyspnea.  No rhinorrhea or nasal congestion or sore throat or earache.  ED Course: When he came to the ER, vital signs were within normal.  Labs revealed sodium 133 and blood glucose of 165 with calcium 8.4.  High sensitive troponin I was 33 and later 28 and lactic acid was 1 L was 0.7.  CBC showed leukopenia of 1.7 and ANC of 0.9 and platelets of 298 with anemia close to baseline.  Blood cultures were drawn. EKG as reviewed by me :   EKG showed normal sinus rhythm with a rate of 95 with left axis deviation and right bundle branch block with Q waves inferiorly Imaging: Two-view chest x-ray showed left midlung mass noted on prior exam currently smaller  status post right IJ Port-A-Cath placement.  The patient was given 1 p.o. Percocet, 500 mill IV normal saline and 2 g of IV cefepime.  He will be admitted to a medical telemetry bed for further evaluation and management. PAST MEDICAL HISTORY:   Past Medical History:  Diagnosis Date   Allergy    BPH (benign prostatic hypertrophy)    Cancer (HCC)    Melanoma on Neck    2008   Carotid artery occlusion    Diabetes mellitus    type 2   ED (erectile dysfunction)    GERD (gastroesophageal reflux disease)    Hemorrhagic stroke ([place]) 06/2021   Hyperlipidemia    Hypertension    Lung cancer ([place])    Retinopathy due to secondary DM ([place])     PAST SURGICAL HISTORY:   Past Surgical History:  Procedure Laterality Date   BRONCHIAL NEEDLE ASPIRATION BIOPSY  06/17/2021   Procedure: BRONCHIAL NEEDLE ASPIRATION BIOPSIES;  Surgeon: Candee Furbish, MD;  Location: [facility] ENDOSCOPY;  Service: Pulmonary;;   IR IMAGING GUIDED PORT INSERTION  08/05/2021   MELANOMA EXCISION  2008   Left side of neck   RADIOLOGY WITH ANESTHESIA N/A 04/05/2020   Procedure: MRI SPINE WITOUT CONTRAST;  Surgeon: Radiologist, Medication, MD;  Location: [place];  Service: Radiology;  Laterality: N/A;   RADIOLOGY WITH ANESTHESIA N/A 08/22/2021   Procedure: MRI BRAIN WITH AND WITHOUT CONTRAST  WITH ANESTHESIA;  Surgeon: Radiologist, Medication, MD;  Location: [place];  Service: Radiology;  Laterality: N/A;   TONSILLECTOMY     VIDEO BRONCHOSCOPY WITH ENDOBRONCHIAL ULTRASOUND N/A 06/17/2021   Procedure: VIDEO BRONCHOSCOPY WITH ENDOBRONCHIAL ULTRASOUND;  Surgeon: Candee Furbish, MD;  Location: [facility] ENDOSCOPY;  Service: Pulmonary;  Laterality: N/A;    SOCIAL HISTORY:   Social History   Tobacco Use   Smoking status: Former    Types: Cigarettes    Quit date: 07/21/1990    Years since quitting: 31.3   Smokeless tobacco: Never  Substance Use Topics   Alcohol use: No    FAMILY HISTORY:   Family History  Problem Relation Age of Onset    COPD Mother    Heart disease Father    Heart disease Brother        MI at age 7   Stroke Neg Hx     DRUG ALLERGIES:   Allergies  Allergen Reactions   Niaspan [Niacin]     FLUSHING    REVIEW OF SYSTEMS:   ROS As per history of present illness. All pertinent systems were reviewed above. Constitutional, HEENT, cardiovascular, respiratory, GI, GU, musculoskeletal, neuro, psychiatric, endocrine, integumentary and hematologic systems were reviewed and are otherwise negative/unremarkable except for positive findings mentioned above in the HPI.   MEDICATIONS AT HOME:   Prior to Admission medications   Medication Sig Start Date End Date Taking? Authorizing Provider  amLODipine (NORVASC) 10 MG tablet TAKE 1 TABLET(10 MG) BY MOUTH DAILY 09/12/21   [name], MD  apixaban (ELIQUIS) 2.5 MG TABS tablet Take 1 tablet (2.5 mg total) by mouth 2 (two) times daily. 09/06/21   [name], MD  atorvastatin (LIPITOR) 40 MG tablet TAKE 1 TABLET(40 MG) BY MOUTH DAILY 09/12/21   [name], MD  calcium-vitamin D (OSCAL WITH D) 500-5 MG-MCG tablet Take 2 tablets by mouth daily. 10/21/21   [name], MD  collagenase (SANTYL) 250 UNIT/GM ointment Apply 1 Application topically daily. 08/27/21   [name], MD  dexamethasone (DECADRON) 4 MG tablet Take 1 tab every 12 hours the day before pemetrexed chemo, then take 2 tabs once a day for 3 days starting the day after carboplatin. 08/06/21   [name], MD  folic acid (FOLVITE) 1 MG tablet Take 1 tablet (1 mg total) by mouth daily. Start 7 days before pemetrexed chemotherapy. Continue until 21 days after pemetrexed completed. 08/06/21   [name], MD  insulin glargine (LANTUS) 100 UNIT/ML Solostar Pen Inject 25 Units into the skin daily. Patient taking differently: Inject 15-30 Units into the skin daily as needed (high blood sugar). 07/12/21   [name], MD  LORazepam (ATIVAN) 0.5 MG tablet Take 1 tablet (0.5 mg total) by mouth See admin  instructions. Take 1 tablet 30 minutes prior to imaging procedure, may take another tablet if needed.Avoid driving after taking medication. 11/11/21   [name], MD  losartan (COZAAR) 50 MG tablet TAKE 1 TABLET(50 MG) BY MOUTH DAILY 09/12/21   [name], MD  metoprolol succinate (TOPROL-XL) 25 MG 24 hr tablet Take 0.5 tablets (12.5 mg total) by mouth daily. 08/23/21   Raulkar, Clide Deutscher, MD  NOVOLOG FLEXPEN 100 UNIT/ML FlexPen Inject into the skin. 08/27/21   [provider]  omeprazole (PRILOSEC OTC) 20 MG tablet Take 20 mg by mouth daily.    [provider]  [facility] ULTRA test strip  09/09/21   [provider]  oxyCODONE-acetaminophen (PERCOCET) 7.5-325 MG tablet Take 1  tablet by mouth every 4 (four) hours as needed for severe pain. 11/13/21   [name], MD  pantoprazole (PROTONIX) 40 MG tablet Take 1 tablet (40 mg total) by mouth daily at 6 (six) AM. 07/26/21   [name], MD  potassium chloride (KLOR-CON M) 10 MEQ tablet TAKE 1 TABLET(10 MEQ) BY MOUTH DAILY 11/18/21   [name], MD  tamsulosin (FLOMAX) 0.4 MG CAPS capsule Take 1 capsule (0.4 mg total) by mouth daily. 11/12/21   [name], MD  topiramate (TOPAMAX) 100 MG tablet TAKE 1 TABLET(100 MG) BY MOUTH AT BEDTIME 08/20/21   [name], MD  traZODone (DESYREL) 50 MG tablet Take 1 tablet (50 mg total) by mouth at bedtime as needed for sleep. 08/26/21   [name], MD  UNABLE TO FIND PLEASE CHECK FASTING BLOOD GLUCOSE EVERY MORNING 07/12/21   [name], MD  prochlorperazine (COMPAZINE) 10 MG tablet Take 1 tablet (10 mg total) by mouth every 6 (six) hours as needed (Nausea or vomiting). 07/23/21 08/06/21  [name], MD      VITAL SIGNS:  Blood pressure (!) 140/72, pulse 83, temperature 99.3 F (37.4 C), temperature source Oral, resp. rate 20, height 5\' 7"  (1.702 m), weight 63.5 kg, SpO2 100 %.  PHYSICAL EXAMINATION:  Physical Exam  GENERAL:  71 y.o.-year-old Caucasian male patient lying in the bed with no acute  distress.  EYES: Pupils equal, round, reactive to light and accommodation. No scleral icterus. Extraocular muscles intact.  HEENT: Head atraumatic, normocephalic. Oropharynx and nasopharynx clear.  NECK:  Supple, no jugular venous distention. No thyroid enlargement, no tenderness.  LUNGS: Normal breath sounds bilaterally, no wheezing, rales,rhonchi or crepitation. No use of accessory muscles of respiration.  CARDIOVASCULAR: Regular rate and rhythm, S1, S2 normal. No murmurs, rubs, or gallops.  ABDOMEN: Soft, nondistended, nontender. Bowel sounds present. No organomegaly or mass.  EXTREMITIES: No pedal edema, cyanosis, or clubbing.  NEUROLOGIC: Cranial nerves II through XII are intact. Muscle strength 5/5 in all extremities. Sensation intact. Gait not checked.  PSYCHIATRIC: The patient is alert and oriented x 3.  Normal affect and good eye contact. SKIN: No obvious rash, lesion, or ulcer.   LABORATORY PANEL:   CBC Recent Labs  Lab 11/18/21 1619  WBC 1.7*  1.7*  HGB 8.8*  8.7*  HCT 27.9*  27.5*  PLT 298  303   ------------------------------------------------------------------------------------------------------------------  Chemistries  Recent Labs  Lab 11/18/21 1619  NA 133*  K 3.7  CL 99  CO2 23  GLUCOSE 165*  BUN 10  CREATININE 0.67  CALCIUM 8.4*   ------------------------------------------------------------------------------------------------------------------  Cardiac Enzymes No results for input(s): "TROPONINI" in the last 168 hours. ------------------------------------------------------------------------------------------------------------------  RADIOLOGY:  CT ABDOMEN PELVIS W CONTRAST  Result Date: 11/18/2021 CLINICAL DATA:  Acute abdominal pain. Weakness. Fevers. Patient with history of lung cancer and osseous metastasis. EXAM: CT ABDOMEN AND PELVIS WITH CONTRAST TECHNIQUE: Multidetector CT imaging of the abdomen and pelvis was performed using the standard  protocol following bolus administration of intravenous contrast. RADIATION DOSE REDUCTION: This exam was performed according to the departmental dose-optimization program which includes automated exposure control, adjustment of the mA and/or kV according to patient size and/or use of iterative reconstruction technique. CONTRAST:  127mL OMNIPAQUE IOHEXOL 300 MG/ML  SOLN COMPARISON:  PET CT 10/29/2021, abdominopelvic CT 06/18/2021 FINDINGS: Lower chest: No basilar consolidation or pleural effusion. The previous basilar pulmonary embolus is not seen on the current exam. Hepatobiliary: Focal fatty infiltration adjacent to the falciform ligament. No suspicious  liver lesion Gallbladder physiologically distended, no calcified stone. No biliary dilatation. Pancreas: No ductal dilatation or inflammation.  Mild fatty atrophy. Spleen: Normal in size without focal abnormality. Adrenals/Urinary Tract: Normal adrenal glands. No hydronephrosis or perinephric edema. Homogeneous renal enhancement with symmetric excretion on delayed phase imaging. Cortical scarring in the lateral mid left kidney. Left renal cyst is well as bilateral low-density renal lesions are too small to accurately characterize. No specific imaging follow-up is recommended. Urinary bladder is physiologically distended without wall thickening. Stomach/Bowel: Wall thickening with perienteric edema and enhancement involving a moderate length segment of distal ileum, terminating just before the terminal ileum. Just proximal to this inflamed small bowel segments is fecalization of small bowel contents. More proximal small bowel mildly prominent and fluid-filled. There is no bowel pneumatosis. There is no associated colonic inflammation. The appendix tentatively visualized and uninflamed. Unremarkable appearance of the stomach. No colonic inflammation. Vascular/Lymphatic: Moderate to advanced aortic and branch atherosclerosis. No aortic aneurysm. The portal and  superior mesenteric veins are patent. There is perivascular edema about the small bowel mesenteric vessels but no embolic disease. No bulky adenopathy. Reproductive: Prostate is unremarkable. Other: Inflammatory change in the right lower quadrant related to small bowel inflammation. Fat stranding and a small amount of non organized free fluid. No focal fluid collection or free air. Minimal fat in the inguinal canals. Musculoskeletal: Mild L2 superior endplate compression deformity appears similar to recent PET, hypermetabolic lesion on 26/71/2458 PET. There is a large Schmorl's node involving the superior endplate of L5, likely pathologic given prior hypermetabolic lesion on 09/98/3382 PET. Patient's known osseous metastatic disease is better appreciated on recent PET. Cortical thinning with ill-defined lytic lesion involving the right anterior iliac bone, as seen on prior PET. Again seen prior left lateral lower rib fracture. IMPRESSION: 1. Moderate length segment of inflamed distal ileum, terminating just before the terminal ileum, consistent with enteritis. This may be infectious or inflammatory. No perforation or abscess. 2. Fecalization of small bowel contents just proximal to the inflamed small bowel segment likely related to delayed transit time. No obstruction. 3. Patient's known osseous metastatic disease is better appreciated on recent PET. Aortic Atherosclerosis (ICD10-I70.0). Electronically Signed   By: Keith Rake M.D.   On: 11/18/2021 20:44   DG Chest 2 View  Result Date: 11/18/2021 CLINICAL DATA:  Weakness.  History of lung cancer. EXAM: CHEST - 2 VIEW COMPARISON:  June 12, 2021. FINDINGS: The heart size and mediastinal contours are within normal limits. Right internal jugular Port-A-Cath is noted with distal tip in expected position of SVC. Right lung is clear. Irregular nodular density is noted in left midlung which is significantly smaller compared to prior exam consistent with lung mass  noted on prior exam. The visualized skeletal structures are unremarkable. IMPRESSION: Left midlung mass noted on prior exam is smaller currently. Status post right internal jugular Port-A-Cath placement. No other abnormality is noted. Electronically Signed   By: Marijo Conception M.D.   On: 11/18/2021 16:47      IMPRESSION AND PLAN:  Assessment and Plan: * Neutropenic fever ([place]) - The patient be admitted to a medical telemetry bed. - Will be placed on reverse isolation. - We will follow blood cultures. - We will continue antibiotic therapy with IV vancomycin, Flagyl and cefepime. - We will obtain an oncology consult. - I notified Dr. Janese Banks about the patient.  Lower abdominal pain - Given his neutropenic fever though he has no significant tenderness, I will still obtain abdominal and  pelvic CT scan for further assessment.  Primary non-small cell carcinoma of upper lobe of left lung ([place]) -He is undergoing chemotherapy. - Oncology consult will be obtained.  Dyslipidemia - We will continue statin therapy.  Essential hypertension - The patient will be continued on his antihypertensives.  Pulmonary embolus (HCC) - We will continue his Eliquis.  Type 2 diabetes mellitus without complications ([place]) - She will be placed on supplement coverage with NovoLog and will continue his basal coverage.       DVT prophylaxis: Eliquis. Advanced Care Planning:  Code Status: full code. Family Communication:  The plan of care was discussed in details with the patient (and family). I answered all questions. The patient agreed to proceed with the above mentioned plan. Further management will depend upon hospital course. Disposition Plan: Back to previous home environment Consults called: Oncology. All the records are reviewed and case discussed with ED provider.  Status is: Inpatient    At the time of the admission, it appears that the appropriate admission status for this patient is inpatient.   This is judged to be reasonable and necessary in order to provide the required intensity of service to ensure the patient's safety given the presenting symptoms, physical exam findings and initial radiographic and laboratory data in the context of comorbid conditions.  The patient requires inpatient status due to high intensity of service, high risk of further deterioration and high frequency of surveillance required.  I certify that at the time of admission, it is my clinical judgment that the patient will require inpatient hospital care extending more than 2 midnights.                            Dispo: The patient is from: Home              Anticipated d/c is to: Home              Patient currently is not medically stable to d/c.              Difficult to place patient: No  Christel Mormon M.D on 11/18/2021 at 10:48 PM  [facility]   From 7 PM-7 AM, contact night-coverage [email or web address]  CC: Primary care physician; [name], MD

## 2021-11-19 DIAGNOSIS — R5081 Fever presenting with conditions classified elsewhere: Secondary | ICD-10-CM | POA: Diagnosis not present

## 2021-11-19 DIAGNOSIS — C3412 Malignant neoplasm of upper lobe, left bronchus or lung: Secondary | ICD-10-CM | POA: Diagnosis not present

## 2021-11-19 DIAGNOSIS — D649 Anemia, unspecified: Secondary | ICD-10-CM

## 2021-11-19 DIAGNOSIS — K529 Noninfective gastroenteritis and colitis, unspecified: Secondary | ICD-10-CM

## 2021-11-19 DIAGNOSIS — D709 Neutropenia, unspecified: Secondary | ICD-10-CM | POA: Diagnosis not present

## 2021-11-19 DIAGNOSIS — D638 Anemia in other chronic diseases classified elsewhere: Secondary | ICD-10-CM

## 2021-11-19 LAB — CBC WITH DIFFERENTIAL/PLATELET
Abs Immature Granulocytes: 0.08 10*3/uL — ABNORMAL HIGH (ref 0.00–0.07)
Basophils Absolute: 0 10*3/uL (ref 0.0–0.1)
Basophils Relative: 1 %
Eosinophils Absolute: 0 10*3/uL (ref 0.0–0.5)
Eosinophils Relative: 1 %
HCT: 23 % — ABNORMAL LOW (ref 39.0–52.0)
Hemoglobin: 7.3 g/dL — ABNORMAL LOW (ref 13.0–17.0)
Immature Granulocytes: 4 %
Lymphocytes Relative: 15 %
Lymphs Abs: 0.3 10*3/uL — ABNORMAL LOW (ref 0.7–4.0)
MCH: 30.5 pg (ref 26.0–34.0)
MCHC: 31.7 g/dL (ref 30.0–36.0)
MCV: 96.2 fL (ref 80.0–100.0)
Monocytes Absolute: 0.4 10*3/uL (ref 0.1–1.0)
Monocytes Relative: 18 %
Neutro Abs: 1.2 10*3/uL — ABNORMAL LOW (ref 1.7–7.7)
Neutrophils Relative %: 61 %
Platelets: 221 10*3/uL (ref 150–400)
RBC: 2.39 MIL/uL — ABNORMAL LOW (ref 4.22–5.81)
RDW: 15.7 % — ABNORMAL HIGH (ref 11.5–15.5)
WBC: 1.9 10*3/uL — ABNORMAL LOW (ref 4.0–10.5)
nRBC: 0 % (ref 0.0–0.2)

## 2021-11-19 LAB — HEMOGLOBIN AND HEMATOCRIT, BLOOD
HCT: 25.8 % — ABNORMAL LOW (ref 39.0–52.0)
Hemoglobin: 8.5 g/dL — ABNORMAL LOW (ref 13.0–17.0)

## 2021-11-19 LAB — RESPIRATORY PANEL BY PCR

## 2021-11-19 LAB — URINALYSIS, COMPLETE (UACMP) WITH MICROSCOPIC
Bacteria, UA: NONE SEEN
Bilirubin Urine: NEGATIVE
Glucose, UA: NEGATIVE mg/dL
Hgb urine dipstick: NEGATIVE
Ketones, ur: NEGATIVE mg/dL
Leukocytes,Ua: NEGATIVE
Nitrite: NEGATIVE
Protein, ur: NEGATIVE mg/dL
Specific Gravity, Urine: 1.013 (ref 1.005–1.030)
Squamous Epithelial / HPF: NONE SEEN (ref 0–5)
pH: 6 (ref 5.0–8.0)

## 2021-11-19 LAB — PROCALCITONIN: Procalcitonin: 0.13 ng/mL

## 2021-11-19 LAB — CBC
HCT: 21 % — ABNORMAL LOW (ref 39.0–52.0)
Hemoglobin: 6.7 g/dL — ABNORMAL LOW (ref 13.0–17.0)
MCH: 30 pg (ref 26.0–34.0)
MCHC: 31.9 g/dL (ref 30.0–36.0)
MCV: 94.2 fL (ref 80.0–100.0)
Platelets: 216 10*3/uL (ref 150–400)
RBC: 2.23 MIL/uL — ABNORMAL LOW (ref 4.22–5.81)
RDW: 15.2 % (ref 11.5–15.5)
WBC: 2.1 10*3/uL — ABNORMAL LOW (ref 4.0–10.5)
nRBC: 0 % (ref 0.0–0.2)

## 2021-11-19 LAB — PROTIME-INR
INR: 1.2 (ref 0.8–1.2)
Prothrombin Time: 15.1 seconds (ref 11.4–15.2)

## 2021-11-19 LAB — VITAMIN B12: Vitamin B-12: 590 pg/mL (ref 180–914)

## 2021-11-19 LAB — BASIC METABOLIC PANEL
Anion gap: 6 (ref 5–15)
BUN: 9 mg/dL (ref 8–23)
CO2: 26 mmol/L (ref 22–32)
Calcium: 7.6 mg/dL — ABNORMAL LOW (ref 8.9–10.3)
Chloride: 105 mmol/L (ref 98–111)
Creatinine, Ser: 0.72 mg/dL (ref 0.61–1.24)
GFR, Estimated: >60 mL/min (ref >60)
Glucose, Bld: 123 mg/dL — ABNORMAL HIGH (ref 70–99)
Potassium: 3.4 mmol/L — ABNORMAL LOW (ref 3.5–5.1)
Sodium: 137 mmol/L (ref 135–145)

## 2021-11-19 LAB — IRON AND TIBC
Iron: 21 ug/dL — ABNORMAL LOW (ref 45–182)
Saturation Ratios: 9 % — ABNORMAL LOW (ref 17.9–39.5)
TIBC: 246 ug/dL — ABNORMAL LOW (ref 250–450)
UIBC: 225 ug/dL

## 2021-11-19 LAB — CORTISOL-AM, BLOOD: Cortisol - AM: 15.6 ug/dL (ref 6.7–22.6)

## 2021-11-19 LAB — ABO/RH: ABO/RH(D): O POS

## 2021-11-19 LAB — TSH: TSH: 2.344 u[IU]/mL (ref 0.350–4.500)

## 2021-11-19 LAB — PREPARE RBC (CROSSMATCH)

## 2021-11-19 LAB — FERRITIN: Ferritin: 895 ng/mL — ABNORMAL HIGH (ref 24–336)

## 2021-11-19 MED ORDER — GUAIFENESIN 100 MG/5ML PO LIQD: 5.0000 mL | ORAL | Status: DC | PRN

## 2021-11-19 MED ORDER — POTASSIUM CHLORIDE CRYS ER 20 MEQ PO TBCR
40.0000 meq | EXTENDED_RELEASE_TABLET | Freq: Once | ORAL | Status: AC
  Administered 2021-11-19: 40 meq via ORAL
  Filled 2021-11-19: qty 2

## 2021-11-19 MED ORDER — METOPROLOL TARTRATE 5 MG/5ML IV SOLN: 5.0000 mg | INTRAVENOUS | Status: DC | PRN

## 2021-11-19 MED ORDER — METRONIDAZOLE 500 MG/100ML IV SOLN
500.0000 mg | Freq: Two times a day (BID) | INTRAVENOUS | Status: DC
  Administered 2021-11-19 – 2021-11-20 (×4): 500 mg via INTRAVENOUS
  Filled 2021-11-19 (×4): qty 100

## 2021-11-19 MED ORDER — SODIUM CHLORIDE 0.9% IV SOLUTION: Freq: Once | INTRAVENOUS | Status: AC

## 2021-11-19 MED ORDER — HYDRALAZINE HCL 20 MG/ML IJ SOLN: 10.0000 mg | INTRAMUSCULAR | Status: DC | PRN

## 2021-11-19 MED ORDER — IPRATROPIUM-ALBUTEROL 0.5-2.5 (3) MG/3ML IN SOLN: 3.0000 mL | RESPIRATORY_TRACT | Status: DC | PRN

## 2021-11-19 MED ORDER — SENNOSIDES-DOCUSATE SODIUM 8.6-50 MG PO TABS: 1.0000 | ORAL_TABLET | Freq: Every evening | ORAL | Status: DC | PRN

## 2021-11-19 NOTE — Progress Notes (Addendum)
PROGRESS NOTE    Micheal Donovan  YTK:[id number] DOB: [birth year] DOA: 11/18/2021 PCP: [name], MD   Brief Narrative:   71 y.o. Caucasian male with medical history significant for primary non-small cell lung cancer of the left upper lobe, PE, type 2 diabetes mellitus, essential hypertension, lumbar radiculopathy, history of melanoma, BPH, dyslipidemia and diabetic retinopathy, presented to the ER with acute onset of generalized weakness and fever at home with a temperature of 100.8 for which he was advised to come to the ER by his cancer center staff was concerned about neutropenic fever.  Cultures were obtained and he was started on broad-spectrum antibiotics.  Patient was also found to be anemic.  Currently he is adamantly refusing any transfusion or further management until seen by his oncologist.    Assessment & Plan:  Principal Problem:   Neutropenic fever ([place]) Active Problems:   Primary non-small cell carcinoma of upper lobe of left lung ([place])   Lower abdominal pain   Type 2 diabetes mellitus without complications ([place])   Pulmonary embolus (H[facility])   Essential hypertension   Dyslipidemia     Assessment and Plan: * Neutropenic fever ([place]) Acute on Chronic ANemia - Unclear etiology, follow-up culture data.  Does have a port in place therefore monitor for any new signs of infection.  Currently on vancomycin/cefepime and Flagyl.  Dr. Janese Banks from oncology consulted. ProCal 0.13 Transfuse 1U PRBC. Check Iron Studies, B12, TSH and Folate.  UA is pending. Resp Panel ordered.   Lower abdominal pain - CT Abd/Pelvis shows mild enteritis. Will check GI Panel and C diff  Hypokalemia Replete prn, check Mg and Phos  Primary non-small cell carcinoma of upper lobe of left lung ([place]), stage IV. April 2023 Chronic pain - Follows with outpatient Dr. Talbert Cage.  Has brain and bone metastases.  On outpatient chemotherapy.  Intraparenchymal hemorrhage of the brain - Eliquis reduced to 2.5 mg  twice daily.  Repeat MRI June showed improvement.   Dyslipidemia - We will continue statin therapy.  Essential hypertension - Norvasc, losartan, Toprol-XL  ADDENDUM AT 230PM: Patient Norvasc, Losartan, Toprol XL and PPI has been discontinued by Onc.   Pulmonary embolus (HCC) - We will continue his Eliquis.  Type 2 diabetes mellitus without complications (HCC) - Sliding scale and Accu-Cheks.  Semglee 25 units daily.  BPH - Flomax  GERD PPI  Patient is currently refusing any further care at this time including blood transfusion.  He wants to leave [place] today.  I explained him risks and benefits of leaving and current medical condition.  I also spoke with the patient's daughter regarding him.  DVT prophylaxis: Eliquis Code Status: Full Code Family Communication: Spoke with the patient's daughter  Status is: Inpatient On evaluation for neutropenic fever and requires 1 unit PRBC transfusion  Subjective: Seen and examined at bedside.  Patient is adamant about leaving [place].  I explained him why he needs blood transfusion and further evaluation for any infection especially because he is on chemotherapy    Examination:  Constitutional: Not in acute distress Respiratory: Clear to auscultation bilaterally Cardiovascular: Normal sinus rhythm, no rubs Abdomen: Nontender nondistended good bowel sounds Musculoskeletal: No edema noted Skin: No rashes seen Neurologic: CN 2-12 grossly intact.  And nonfocal Psychiatric: Normal judgment and insight. Alert and oriented x 3. Normal mood. Objective: Vitals:   11/18/21 2100 11/18/21 2350 11/19/21 0020 11/19/21 0510  BP: (!) 140/72 122/63 114/64 (!) 117/59  Pulse: 83 82 81 78  Resp:  16 18 16   Temp: 99.3 F (37.4 C) 98.7 F (37.1 C) 98.3 F (36.8 C) 98.6 F (37 C)  TempSrc: Oral Oral    SpO2: 100% 98% 98% 100%  Weight:      Height:        Intake/Output Summary (Last 24 hours) at 11/19/2021  0742 Last data filed at 11/19/2021 0400 Gross per 24 hour  Intake 1049.57 ml  Output --  Net 1049.57 ml   Filed Weights   11/18/21 1614  Weight: 63.5 kg     Data Reviewed:   CBC: Recent Labs  Lab 11/18/21 1619 11/19/21 0527  WBC 1.7*  1.7* 2.1*  NEUTROABS 0.9*  --   HGB 8.8*  8.7* 6.7*  HCT 27.9*  27.5* 21.0*  MCV 95.2  95.2 94.2  PLT 298  303 829   Basic Metabolic Panel: Recent Labs  Lab 11/18/21 1619 11/19/21 0527  NA 133* 137  K 3.7 3.4*  CL 99 105  CO2 23 26  GLUCOSE 165* 123*  BUN 10 9  CREATININE 0.67 0.72  CALCIUM 8.4* 7.6*   GFR: Estimated Creatinine Clearance: 76.1 mL/min (by C-G formula based on SCr of 0.72 mg/dL). Liver Function Tests: No results for input(s): "AST", "ALT", "ALKPHOS", "BILITOT", "PROT", "ALBUMIN" in the last 168 hours. No results for input(s): "LIPASE", "AMYLASE" in the last 168 hours. No results for input(s): "AMMONIA" in the last 168 hours. Coagulation Profile: Recent Labs  Lab 11/19/21 0527  INR 1.2   Cardiac Enzymes: No results for input(s): "CKTOTAL", "CKMB", "CKMBINDEX", "TROPONINI" in the last 168 hours. BNP (last 3 results) No results for input(s): "PROBNP" in the last 8760 hours. HbA1C: No results for input(s): "HGBA1C" in the last 72 hours. CBG: No results for input(s): "GLUCAP" in the last 168 hours. Lipid Profile: No results for input(s): "CHOL", "HDL", "LDLCALC", "TRIG", "CHOLHDL", "LDLDIRECT" in the last 72 hours. Thyroid Function Tests: No results for input(s): "TSH", "T4TOTAL", "FREET4", "T3FREE", "THYROIDAB" in the last 72 hours. Anemia Panel: No results for input(s): "VITAMINB12", "FOLATE", "FERRITIN", "TIBC", "IRON", "RETICCTPCT" in the last 72 hours. Sepsis Labs: Recent Labs  Lab 11/18/21 1619 11/18/21 1757 11/19/21 0527  PROCALCITON  --   --  0.13  LATICACIDVEN 1.0 0.7  --     Recent Results (from the past 240 hour(s))  Culture, blood (Routine x 2)     Status: None (Preliminary result)    Collection Time: 11/18/21  4:19 PM   Specimen: BLOOD  Result Value Ref Range Status   Specimen Description BLOOD RIGHT ANTECUBITAL  Final   Special Requests   Final    BOTTLES DRAWN AEROBIC AND ANAEROBIC Blood Culture adequate volume   Culture   Final    NO GROWTH < 24 HOURS Performed at [facility], [address], [place], [place] [address]    Report Status PENDING  Incomplete  Culture, blood (Routine x 2)     Status: None (Preliminary result)   Collection Time: 11/18/21  5:57 PM   Specimen: BLOOD  Result Value Ref Range Status   Specimen Description BLOOD PORTA CATH  Final   Special Requests   Final    BOTTLES DRAWN AEROBIC AND ANAEROBIC Blood Culture adequate volume   Culture   Final    NO GROWTH < 12 HOURS Performed at [facility], [address] [place], [place] [address]    Report Status PENDING  Incomplete  SARS Coronavirus 2 by RT PCR (hospital order, performed in [facility] hospital lab) *cepheid single result test* Anterior Nasal Swab     Status: None   Collection Time: 11/18/21  7:07 PM   Specimen: Anterior Nasal Swab  Result Value Ref Range Status   SARS Coronavirus 2 by RT PCR NEGATIVE NEGATIVE Final    Comment: (NOTE) SARS-CoV-2 target nucleic acids are NOT DETECTED.  The SARS-CoV-2 RNA is generally detectable in upper and lower respiratory specimens during the acute phase of infection. The lowest concentration of SARS-CoV-2 viral copies this assay can detect is 250 copies / mL. A negative result does not preclude SARS-CoV-2 infection and should not be used as the sole basis for treatment or other patient management decisions.  A negative result may occur with improper specimen collection / handling, submission of specimen other than nasopharyngeal swab, presence of viral mutation(s) within the areas targeted by this assay, and inadequate number of viral copies (<250 copies / mL). A negative result must be combined with  clinical observations, patient history, and epidemiological information.  Fact Sheet for Patients:   https://www.patel.info/  Fact Sheet for Healthcare Providers: https://hall.com/  This test is not yet approved or  cleared by the [place] FDA and has been authorized for detection and/or diagnosis of SARS-CoV-2 by FDA under an Emergency Use Authorization (EUA).  This EUA will remain in effect (meaning this test can be used) for the duration of the COVID-19 declaration under Section 564(b)(1) of the Act, 21 U.S.C. section 360bbb-3(b)(1), unless the authorization is terminated or revoked sooner.  Performed at [facility], [address], [place], [place] [address]          Radiology Studies: CT ABDOMEN PELVIS W CONTRAST  Result Date: 11/18/2021 CLINICAL DATA:  Acute abdominal pain. Weakness. Fevers. Patient with history of lung cancer and osseous metastasis. EXAM: CT ABDOMEN AND PELVIS WITH CONTRAST TECHNIQUE: Multidetector CT imaging of the abdomen and pelvis was performed using the standard protocol following bolus administration of intravenous contrast. RADIATION DOSE REDUCTION: This exam was performed according to the departmental dose-optimization program which includes automated exposure control, adjustment of the mA and/or kV according to patient size and/or use of iterative reconstruction technique. CONTRAST:  133mL OMNIPAQUE IOHEXOL 300 MG/ML  SOLN COMPARISON:  PET CT 10/29/2021, abdominopelvic CT 06/18/2021 FINDINGS: Lower chest: No basilar consolidation or pleural effusion. The previous basilar pulmonary embolus is not seen on the current exam. Hepatobiliary: Focal fatty infiltration adjacent to the falciform ligament. No suspicious liver lesion Gallbladder physiologically distended, no calcified stone. No biliary dilatation. Pancreas: No ductal dilatation or inflammation.  Mild fatty atrophy. Spleen: Normal in size  without focal abnormality. Adrenals/Urinary Tract: Normal adrenal glands. No hydronephrosis or perinephric edema. Homogeneous renal enhancement with symmetric excretion on delayed phase imaging. Cortical scarring in the lateral mid left kidney. Left renal cyst is well as bilateral low-density renal lesions are too small to accurately characterize. No specific imaging follow-up is recommended. Urinary bladder is physiologically distended without wall thickening. Stomach/Bowel: Wall thickening with perienteric edema and enhancement involving a moderate length segment of distal ileum, terminating just before the terminal ileum. Just proximal to this inflamed small bowel segments is fecalization of small bowel contents. More proximal small bowel mildly prominent and fluid-filled. There is no bowel pneumatosis. There is no associated colonic inflammation. The appendix tentatively visualized and uninflamed. Unremarkable appearance of the stomach. No colonic inflammation. Vascular/Lymphatic: Moderate to advanced aortic and branch atherosclerosis. No aortic aneurysm. The portal and superior mesenteric veins are patent. There is perivascular edema about  the small bowel mesenteric vessels but no embolic disease. No bulky adenopathy. Reproductive: Prostate is unremarkable. Other: Inflammatory change in the right lower quadrant related to small bowel inflammation. Fat stranding and a small amount of non organized free fluid. No focal fluid collection or free air. Minimal fat in the inguinal canals. Musculoskeletal: Mild L2 superior endplate compression deformity appears similar to recent PET, hypermetabolic lesion on 40/98/1191 PET. There is a large Schmorl's node involving the superior endplate of L5, likely pathologic given prior hypermetabolic lesion on 47/82/9562 PET. Patient's known osseous metastatic disease is better appreciated on recent PET. Cortical thinning with ill-defined lytic lesion involving the right anterior  iliac bone, as seen on prior PET. Again seen prior left lateral lower rib fracture. IMPRESSION: 1. Moderate length segment of inflamed distal ileum, terminating just before the terminal ileum, consistent with enteritis. This may be infectious or inflammatory. No perforation or abscess. 2. Fecalization of small bowel contents just proximal to the inflamed small bowel segment likely related to delayed transit time. No obstruction. 3. Patient's known osseous metastatic disease is better appreciated on recent PET. Aortic Atherosclerosis (ICD10-I70.0). Electronically Signed   By: Keith Rake M.D.   On: 11/18/2021 20:44   DG Chest 2 View  Result Date: 11/18/2021 CLINICAL DATA:  Weakness.  History of lung cancer. EXAM: CHEST - 2 VIEW COMPARISON:  June 12, 2021. FINDINGS: The heart size and mediastinal contours are within normal limits. Right internal jugular Port-A-Cath is noted with distal tip in expected position of SVC. Right lung is clear. Irregular nodular density is noted in left midlung which is significantly smaller compared to prior exam consistent with lung mass noted on prior exam. The visualized skeletal structures are unremarkable. IMPRESSION: Left midlung mass noted on prior exam is smaller currently. Status post right internal jugular Port-A-Cath placement. No other abnormality is noted. Electronically Signed   By: Marijo Conception M.D.   On: 11/18/2021 16:47        Scheduled Meds:  amLODipine  10 mg Oral Daily   apixaban  2.5 mg Oral BID   atorvastatin  40 mg Oral Daily   calcium-vitamin D  2 tablet Oral Q breakfast   folic acid  1 mg Oral Daily   insulin glargine-yfgn  25 Units Subcutaneous Daily   losartan  50 mg Oral Daily   metoprolol succinate  12.5 mg Oral Daily   pantoprazole  40 mg Oral Q breakfast   potassium chloride  10 mEq Oral Daily   tamsulosin  0.4 mg Oral Daily   topiramate  100 mg Oral Daily   Continuous Infusions:  sodium chloride 100 mL/hr at 11/19/21 0041    ceFEPime (MAXIPIME) IV 2 g (11/19/21 0546)   metronidazole 500 mg (11/19/21 0042)   vancomycin       LOS: 1 day   Time spent= 35 mins    Ladavia Lindenbaum Arsenio Loader, MD [facility]  If 7PM-7AM, please contact night-coverage  11/19/2021, 7:42 AM

## 2021-11-19 NOTE — Consult Note (Addendum)
Hematology/Oncology Consult note [phone] [phone]      Patient Care Team: [name], MD as PCP - General (Family Medicine) [name], RN as Oncology Nurse Navigator   Name of the patient: Micheal Donovan  [id number]  03-04-1950   REASON FOR COSULTATION:  Neutropenic fever, consult requested by Dr.Mansy  History of presenting illness-  71 y.o. male with PMH listed at below who presents to ER for evaluation of malaise, abdominal cramps, diarrhea x 1 day, and low grade fever.  Patient is known to me for metastatic lung cancer, he is currently on Alimta/keytruda, last chemo was 1 week ago.  He had non bloody diarrhea x 1 day 5-6 days after chemo, associated with low abdomen cramps.  He ate [employer] sandwiches prior to onset of symptoms. Low grade fever. No dysuria, cough, nasal congestion, sore throat.   In ER, vital signs were within normal   CBC showed leukopenia of 1.7 and ANC of 0.9. anemia with hemoglobin 9.9 close to baseline. Patient was started on supportive care with IVF, empiric antibiotics,   11/18/21 CT abdomen pelvis w contrast showed  1. Moderate length segment of inflamed distal ileum, terminating just before the terminal ileum, consistent with enteritis. This may be infectious or inflammatory. No perforation or abscess. 2. Fecalization of small bowel contents just proximal to the inflamed small bowel segment likely related to delayed transit time. No obstruction.3. Patient's known osseous metastatic disease   Oncology was consulted for further evaluation. He reports feeling better today. No diarrhea, still have lower abdomen cramps.  Hb dropped to 6.7. he declined PRBC transfusion.     Allergies  Allergen Reactions   Niaspan [Niacin]     FLUSHING    Patient Active Problem List   Diagnosis Date Noted   Primary non-small cell carcinoma of upper lobe of left lung ([place]) 07/16/2021    Priority: High   B12 deficiency anemia  09/27/2021    Priority: Medium    Neoplasm related pain 08/16/2021    Priority: Medium    Anemia due to antineoplastic chemotherapy 08/16/2021    Priority: Medium    Metastasis to bone ([place]) 08/06/2021    Priority: Medium    Pulmonary embolus ([place]) 07/23/2021    Priority: Medium    Encounter for antineoplastic chemotherapy 07/17/2021    Priority: Medium    Intraparenchymal hemorrhage of brain ([place]) 06/11/2021    Priority: Medium    Thrombocytosis 06/11/2021    Priority: Medium    Frequent urination 11/11/2021    Priority: Low   Goals of care, counseling/discussion 07/23/2021    Priority: Low   Neutropenic fever ([place]) 11/18/2021   Essential hypertension 11/18/2021   Dyslipidemia 11/18/2021   Lower abdominal pain 11/18/2021   Hemoptysis 07/16/2021   Diabetes mellitus ([place]) 07/02/2021   Pressure injury of skin 06/21/2021   Headache 06/13/2021   Hyperlipidemia 06/12/2021   CAP (community acquired pneumonia) 06/12/2021   Cerebellar bleed ([place])    Type 2 diabetes mellitus with hyperglycemia ([place]) 06/11/2021   Pseudohyponatremia 06/11/2021   Dehydration 06/11/2021   Leukocytosis 06/11/2021   Hypercalcemia 06/11/2021   Lumbar radiculopathy 03/15/2021   Lumbar pain 01/10/2020   Carotid stenosis, bilateral 11/07/2011   Type 2 diabetes mellitus without complications ([place]) 27/25/3664   HYPERCHOLESTEROLEMIA 02/07/2010   Benign hypertension 02/07/2010   CAROTID BRUIT, LEFT 02/07/2010   CHEST PAIN 02/07/2010     Past Medical History:  Diagnosis Date   Allergy    BPH (benign prostatic hypertrophy)  Cancer [facility])    Melanoma on Neck    2008   Carotid artery occlusion    Diabetes mellitus    type 2   ED (erectile dysfunction)    GERD (gastroesophageal reflux disease)    Hemorrhagic stroke ([place]) 06/2021   Hyperlipidemia    Hypertension    Lung cancer ([place])    Retinopathy due to secondary DM [facility])      Past Surgical History:  Procedure Laterality Date   BRONCHIAL  NEEDLE ASPIRATION BIOPSY  06/17/2021   Procedure: BRONCHIAL NEEDLE ASPIRATION BIOPSIES;  Surgeon: Candee Furbish, MD;  Location: [facility] ENDOSCOPY;  Service: Pulmonary;;   IR IMAGING GUIDED PORT INSERTION  08/05/2021   MELANOMA EXCISION  2008   Left side of neck   RADIOLOGY WITH ANESTHESIA N/A 04/05/2020   Procedure: MRI SPINE WITOUT CONTRAST;  Surgeon: Radiologist, Medication, MD;  Location: [place];  Service: Radiology;  Laterality: N/A;   RADIOLOGY WITH ANESTHESIA N/A 08/22/2021   Procedure: MRI BRAIN WITH AND WITHOUT CONTRAST  WITH ANESTHESIA;  Surgeon: Radiologist, Medication, MD;  Location: [place];  Service: Radiology;  Laterality: N/A;   TONSILLECTOMY     VIDEO BRONCHOSCOPY WITH ENDOBRONCHIAL ULTRASOUND N/A 06/17/2021   Procedure: VIDEO BRONCHOSCOPY WITH ENDOBRONCHIAL ULTRASOUND;  Surgeon: Candee Furbish, MD;  Location: [facility] ENDOSCOPY;  Service: Pulmonary;  Laterality: N/A;    Social History   Socioeconomic History   Marital status: Married    Spouse name: Not on file   Number of children: Not on file   Years of education: Not on file   Highest education level: Not on file  Occupational History   Not on file  Tobacco Use   Smoking status: Former    Types: Cigarettes    Quit date: 07/21/1990    Years since quitting: 31.3   Smokeless tobacco: Never  Vaping Use   Vaping Use: Never used  Substance and Sexual Activity   Alcohol use: No   Drug use: No   Sexual activity: Not on file  Other Topics Concern   Not on file  Social History Narrative   Not on file   Social Determinants of Health   Financial Resource Strain: Low Risk  (07/31/2021)   Overall Financial Resource Strain (CARDIA)    Difficulty of Paying Living Expenses: Not very hard  Food Insecurity: No Food Insecurity (11/19/2021)   Hunger Vital Sign    Worried About Running Out of Food in the Last Year: Never true    Ran Out of Food in the Last Year: Never true  Transportation Needs: No Transportation Needs (07/31/2021)    PRAPARE - Hydrologist (Medical): No    Lack of Transportation (Non-Medical): No  Physical Activity: Inactive (07/31/2021)   Exercise Vital Sign    Days of Exercise per Week: 0 days    Minutes of Exercise per Session: 0 min  Stress: Unknown (07/31/2021)   [place]    Feeling of Stress : Patient refused  Social Connections: Unknown (07/31/2021)   Social Connection and Isolation Panel [NHANES]    Frequency of Communication with Friends and Family: Three times a week    Frequency of Social Gatherings with Friends and Family: Three times a week    Attends Religious Services: Patient refused    Active Member of Clubs or Organizations: Patient refused    Attends Archivist Meetings: Patient refused    Marital Status: Married  Intimate  Partner Violence: Not At Risk (07/31/2021)   Humiliation, Afraid, Rape, and Kick questionnaire    Fear of Current or Ex-Partner: No    Emotionally Abused: No    Physically Abused: No    Sexually Abused: No     Family History  Problem Relation Age of Onset   COPD Mother    Heart disease Father    Heart disease Brother        MI at age 41   Stroke Neg Hx      Current Facility-Administered Medications:    0.9 %  sodium chloride infusion (Manually program via Guardrails IV Fluids), , Intravenous, Once, Amin, Ankit Chirag, MD   0.9 %  sodium chloride infusion, , Intravenous, Continuous, Mansy, Jan A, MD, Last Rate: 100 mL/hr at 11/19/21 0041, New Bag at 11/19/21 0041   acetaminophen (TYLENOL) tablet 650 mg, 650 mg, Oral, Q6H PRN **OR** acetaminophen (TYLENOL) suppository 650 mg, 650 mg, Rectal, Q6H PRN, Mansy, Jan A, MD   amLODipine (NORVASC) tablet 10 mg, 10 mg, Oral, Daily, Mansy, Jan A, MD   apixaban Arne [place]) tablet 2.5 mg, 2.5 mg, Oral, BID, Mansy, Jan A, MD, 2.5 mg at 11/19/21 2671   atorvastatin (LIPITOR) tablet 40 mg, 40 mg, Oral, Daily, Mansy,  Jan A, MD   calcium-vitamin D (OSCAL WITH D) 500-5 MG-MCG per tablet 2 tablet, 2 tablet, Oral, Q breakfast, Mansy, Jan A, MD, 2 tablet at 11/19/21 0811   ceFEPIme (MAXIPIME) 2 g in sodium chloride 0.9 % 100 mL IVPB, 2 g, Intravenous, Q8H, Belue, Alver Sorrow, RPH, Last Rate: 200 mL/hr at 11/19/21 0546, 2 g at 24/58/09 9833   folic acid (FOLVITE) tablet 1 mg, 1 mg, Oral, Daily, Mansy, Jan A, MD   guaiFENesin (ROBITUSSIN) 100 MG/5ML liquid 5 mL, 5 mL, Oral, Q4H PRN, Amin, Ankit Chirag, MD   hydrALAZINE (APRESOLINE) injection 10 mg, 10 mg, Intravenous, Q4H PRN, Amin, Ankit Chirag, MD   insulin glargine-yfgn (SEMGLEE) injection 25 Units, 25 Units, Subcutaneous, Daily, Mansy, Jan A, MD   ipratropium-albuterol (DUONEB) 0.5-2.5 (3) MG/3ML nebulizer solution 3 mL, 3 mL, Nebulization, Q4H PRN, Amin, Ankit Chirag, MD   losartan (COZAAR) tablet 50 mg, 50 mg, Oral, Daily, Mansy, Jan A, MD   magnesium hydroxide (MILK OF MAGNESIA) suspension 30 mL, 30 mL, Oral, Daily PRN, Mansy, Jan A, MD   metoprolol succinate (TOPROL-XL) 24 hr tablet 12.5 mg, 12.5 mg, Oral, Daily, Mansy, Jan A, MD   metoprolol tartrate (LOPRESSOR) injection 5 mg, 5 mg, Intravenous, Q4H PRN, Amin, Ankit Chirag, MD   metroNIDAZOLE (FLAGYL) IVPB 500 mg, 500 mg, Intravenous, Q12H, Belue, Alver Sorrow, RPH, Last Rate: 100 mL/hr at 11/19/21 0042, 500 mg at 11/19/21 0042   ondansetron (ZOFRAN) tablet 4 mg, 4 mg, Oral, Q6H PRN **OR** ondansetron (ZOFRAN) injection 4 mg, 4 mg, Intravenous, Q6H PRN, Mansy, Jan A, MD   oxyCODONE-acetaminophen (PERCOCET) 7.5-325 MG per tablet 1 tablet, 1 tablet, Oral, Q4H PRN, Merlyn Lot, MD, 1 tablet at 11/18/21 2139   pantoprazole (PROTONIX) EC tablet 40 mg, 40 mg, Oral, Q breakfast, Mansy, Jan A, MD, 40 mg at 11/19/21 8250   senna-docusate (Senokot-S) tablet 1 tablet, 1 tablet, Oral, QHS PRN, Amin, Ankit Chirag, MD   tamsulosin (FLOMAX) capsule 0.4 mg, 0.4 mg, Oral, Daily, Mansy, Jan A, MD, 0.4 mg at 11/19/21 1025    topiramate (TOPAMAX) tablet 100 mg, 100 mg, Oral, Daily, Mansy, Jan A, MD   traZODone (DESYREL) tablet 50 mg, 50 mg, Oral, QHS PRN, Mansy,  Jan A, MD   vancomycin (VANCOREADY) IVPB 750 mg/150 mL, 750 mg, Intravenous, Q12H, Belue, Alver Sorrow, RPH  Review of Systems  Constitutional:  Positive for appetite change and fatigue.  HENT:   Positive for sore throat.   Eyes:  Negative for icterus.  Respiratory:  Negative for cough and shortness of breath.   Gastrointestinal:  Positive for abdominal pain and diarrhea.  Genitourinary:  Negative for dysuria.   Musculoskeletal:  Negative for arthralgias.  Skin:  Negative for rash.  Neurological:  Negative for light-headedness.  Hematological:  Negative for adenopathy.  Psychiatric/Behavioral:  Negative for confusion.     PHYSICAL EXAM Vitals:   11/19/21 0020 11/19/21 0510 11/19/21 0746 11/19/21 1104  BP: 114/64 (!) 117/59 111/64 110/63  Pulse: 81 78 77 73  Resp: 18 16 17 16   Temp: 98.3 F (36.8 C) 98.6 F (37 C) 98.2 F (36.8 C) 98.2 F (36.8 C)  TempSrc:   Oral   SpO2: 98% 100% 99% 100%  Weight:      Height:       Physical Exam Constitutional:      General: He is not in acute distress.    Appearance: He is not diaphoretic.  HENT:     Head: Normocephalic and atraumatic.     Nose: Nose normal.     Mouth/Throat:     Pharynx: No oropharyngeal exudate.  Eyes:     General: No scleral icterus.    Pupils: Pupils are equal, round, and reactive to light.  Cardiovascular:     Rate and Rhythm: Normal rate and regular rhythm.     Heart sounds: No murmur heard. Pulmonary:     Effort: Pulmonary effort is normal. No respiratory distress.  Abdominal:     General: There is no distension.     Palpations: Abdomen is soft.  Musculoskeletal:        General: Normal range of motion.     Cervical back: Normal range of motion and neck supple.  Skin:    General: Skin is warm and dry.     Coloration: Skin is pale.  Neurological:     Mental Status:  He is alert and oriented to person, place, and time. Mental status is at baseline.     Cranial Nerves: No cranial nerve deficit.     Motor: No abnormal muscle tone.  Psychiatric:        Mood and Affect: Mood and affect normal.       LABORATORY STUDIES    Latest Ref Rng & Units 11/19/2021    5:27 AM 11/18/2021    4:19 PM 11/11/2021    8:40 AM  CBC  WBC 4.0 - 10.5 K/uL 2.1  1.7    1.7  5.4   Hemoglobin 13.0 - 17.0 g/dL 6.7  8.7    8.8  8.2   Hematocrit 39.0 - 52.0 % 21.0  27.5    27.9  25.6   Platelets 150 - 400 K/uL 216  303    298  501       Latest Ref Rng & Units 11/19/2021    5:27 AM 11/18/2021    4:19 PM 11/11/2021    8:40 AM  CMP  Glucose 70 - 99 mg/dL 123  165  148   BUN 8 - 23 mg/dL 9  10  7    Creatinine 0.61 - 1.24 mg/dL 0.72  0.67  0.65   Sodium 135 - 145 mmol/L 137  133  138   Potassium 3.5 -  5.1 mmol/L 3.4  3.7  3.0   Chloride 98 - 111 mmol/L 105  99  104   CO2 22 - 32 mmol/L 26  23  26    Calcium 8.9 - 10.3 mg/dL 7.6  8.4  8.4   Total Protein 6.5 - 8.1 g/dL   6.7   Total Bilirubin 0.3 - 1.2 mg/dL   0.3   Alkaline Phos 38 - 126 U/L   79   AST 15 - 41 U/L   20   ALT 0 - 44 U/L   13      RADIOGRAPHIC STUDIES: I have personally reviewed the radiological images as listed and agreed with the findings in the report. CT ABDOMEN PELVIS W CONTRAST  Result Date: 11/18/2021 CLINICAL DATA:  Acute abdominal pain. Weakness. Fevers. Patient with history of lung cancer and osseous metastasis. EXAM: CT ABDOMEN AND PELVIS WITH CONTRAST TECHNIQUE: Multidetector CT imaging of the abdomen and pelvis was performed using the standard protocol following bolus administration of intravenous contrast. RADIATION DOSE REDUCTION: This exam was performed according to the departmental dose-optimization program which includes automated exposure control, adjustment of the mA and/or kV according to patient size and/or use of iterative reconstruction technique. CONTRAST:  170mL OMNIPAQUE IOHEXOL  300 MG/ML  SOLN COMPARISON:  PET CT 10/29/2021, abdominopelvic CT 06/18/2021 FINDINGS: Lower chest: No basilar consolidation or pleural effusion. The previous basilar pulmonary embolus is not seen on the current exam. Hepatobiliary: Focal fatty infiltration adjacent to the falciform ligament. No suspicious liver lesion Gallbladder physiologically distended, no calcified stone. No biliary dilatation. Pancreas: No ductal dilatation or inflammation.  Mild fatty atrophy. Spleen: Normal in size without focal abnormality. Adrenals/Urinary Tract: Normal adrenal glands. No hydronephrosis or perinephric edema. Homogeneous renal enhancement with symmetric excretion on delayed phase imaging. Cortical scarring in the lateral mid left kidney. Left renal cyst is well as bilateral low-density renal lesions are too small to accurately characterize. No specific imaging follow-up is recommended. Urinary bladder is physiologically distended without wall thickening. Stomach/Bowel: Wall thickening with perienteric edema and enhancement involving a moderate length segment of distal ileum, terminating just before the terminal ileum. Just proximal to this inflamed small bowel segments is fecalization of small bowel contents. More proximal small bowel mildly prominent and fluid-filled. There is no bowel pneumatosis. There is no associated colonic inflammation. The appendix tentatively visualized and uninflamed. Unremarkable appearance of the stomach. No colonic inflammation. Vascular/Lymphatic: Moderate to advanced aortic and branch atherosclerosis. No aortic aneurysm. The portal and superior mesenteric veins are patent. There is perivascular edema about the small bowel mesenteric vessels but no embolic disease. No bulky adenopathy. Reproductive: Prostate is unremarkable. Other: Inflammatory change in the right lower quadrant related to small bowel inflammation. Fat stranding and a small amount of non organized free fluid. No focal fluid  collection or free air. Minimal fat in the inguinal canals. Musculoskeletal: Mild L2 superior endplate compression deformity appears similar to recent PET, hypermetabolic lesion on 35/45/6256 PET. There is a large Schmorl's node involving the superior endplate of L5, likely pathologic given prior hypermetabolic lesion on 38/93/7342 PET. Patient's known osseous metastatic disease is better appreciated on recent PET. Cortical thinning with ill-defined lytic lesion involving the right anterior iliac bone, as seen on prior PET. Again seen prior left lateral lower rib fracture. IMPRESSION: 1. Moderate length segment of inflamed distal ileum, terminating just before the terminal ileum, consistent with enteritis. This may be infectious or inflammatory. No perforation or abscess. 2. Fecalization of small bowel contents just  proximal to the inflamed small bowel segment likely related to delayed transit time. No obstruction. 3. Patient's known osseous metastatic disease is better appreciated on recent PET. Aortic Atherosclerosis (ICD10-I70.0). Electronically Signed   By: Keith Rake M.D.   On: 11/18/2021 20:44   DG Chest 2 View  Result Date: 11/18/2021 CLINICAL DATA:  Weakness.  History of lung cancer. EXAM: CHEST - 2 VIEW COMPARISON:  June 12, 2021. FINDINGS: The heart size and mediastinal contours are within normal limits. Right internal jugular Port-A-Cath is noted with distal tip in expected position of SVC. Right lung is clear. Irregular nodular density is noted in left midlung which is significantly smaller compared to prior exam consistent with lung mass noted on prior exam. The visualized skeletal structures are unremarkable. IMPRESSION: Left midlung mass noted on prior exam is smaller currently. Status post right internal jugular Port-A-Cath placement. No other abnormality is noted. Electronically Signed   By: Marijo Conception M.D.   On: 11/18/2021 16:47   NM PET Image Restag (PS) Skull Base To  Thigh  Result Date: 10/30/2021 CLINICAL DATA:  Subsequent treatment strategy for lung cancer. EXAM: NUCLEAR MEDICINE PET SKULL BASE TO THIGH TECHNIQUE: 8.6 mCi F-18 FDG was injected intravenously. Full-ring PET imaging was performed from the skull base to thigh after the radiotracer. CT data was obtained and used for attenuation correction and anatomic localization. Fasting blood glucose: 147 mg/dl COMPARISON:  07/24/2021. FINDINGS: Mediastinal blood pool activity: SUV max 1.9 Liver activity: SUV max NA NECK: No abnormal hypermetabolism. Incidental CT findings: Retention cysts or polyps in the maxillary sinuses. CHEST: Left upper lobe mass has decreased in size and hypermetabolism, now measuring approximately 1.8 x 2.2 cm, SUV max 2.2, compared to 3.8 x 3.9 cm, SUV max 19.6. No hypermetabolic mediastinal, hilar or axillary lymph nodes. Incidental CT findings: Right IJ Port-A-Cath terminates in the right atrium. Atherosclerotic calcification of the aorta and coronary arteries. Heart is at the upper limits of normal in size. No pericardial effusion. 6 mm posterior left upper lobe nodule (2/68), likely unchanged and too small for PET resolution. ABDOMEN/PELVIS: No abnormal hypermetabolism in the liver, adrenal glands, spleen or pancreas. No hypermetabolic lymph nodes. Incidental CT findings: Liver, gallbladder adrenal glands are unremarkable. Subcentimeter low-attenuation lesion in the interpolar right kidney, too small to characterize. No specific follow-up necessary. Kidneys, spleen, pancreas, stomach and bowel are otherwise grossly unremarkable. Atherosclerotic calcification of the aorta. SKELETON: Interval decrease in hypermetabolism associated with bone metastases. Index lytic lesion in the right iliac wing has decreased to SUV max 2.1, from 15.9. Incidental CT findings: Degenerative changes in the spine. Healing pathologic fracture of the lateral left seventh rib. IMPRESSION: 1. Interval response to therapy as  evidenced by a small residual left upper lobe nodule with decreased hypermetabolism, no residual hypermetabolic adenopathy and decreased hypermetabolism associated with osseous metastases. 2. 6 mm posterior left upper lobe nodule, likely stable. Recommend attention on follow-up. 3. Aortic atherosclerosis (ICD10-I70.0). Coronary artery calcification. Electronically Signed   By: Lorin Picket M.D.   On: 10/30/2021 08:41   MR Brain W Wo Contrast  Result Date: 08/22/2021 CLINICAL DATA:  Brain/CNS neoplasm, monitor 3T SRS Protocol; lung cancer EXAM: MRI HEAD WITHOUT AND WITH CONTRAST TECHNIQUE: Multiplanar, multiecho pulse sequences of the brain and surrounding structures were obtained without and with intravenous contrast. CONTRAST:  27mL GADAVIST GADOBUTROL 1 MMOL/ML IV SOLN COMPARISON:  06/12/2021 FINDINGS: Brain: Decrease in size of left cerebellar hematoma with late subacute and chronic blood products present. Surrounding  edema has resolved. Mass effect has nearly resolved. No evidence of underlying lesion. More superiorly within the left cerebellar vermis, there is an additional smaller area of hemorrhage that appears more recent. There is minimal curvilinear enhancement and minimal edema. New focus of susceptibility with enhancement in the inferior left frontal gyrus (series 1200, image 200). New foci of susceptibility in the posterior right putamen, right parietal subcortical white matter, and posterior right cerebellum. There may be minimal associated enhancement in the right cerebellum. No acute infarction. Ventricles and sulci are prominent reflecting mild volume loss. Patchy foci of T2 hyperintensity in the supratentorial white matter nonspecific but may reflect mild chronic microvascular ischemic changes. Vascular: Diminished left vertebral artery flow void as before. Otherwise major vessel flow voids are preserved. Skull and upper cervical spine: Enhancing lesion of the posterior right parietal  calvarium with minor extraosseous extension including some epidural involvement. This was present in retrospect and has increased in size. Sinuses/Orbits: Paranasal sinus mucosal thickening. Orbits are unremarkable. Other: Sella is unremarkable.  Mastoid air cells are clear. IMPRESSION: Decrease in size of left cerebellar hematoma with resolution of edema. No evidence of underlying lesion. Smaller, more recent hemorrhage in the left cerebellar vermis with minimal edema. There is minimal enhancement without definite evidence of underlying lesion. New punctate focus of chronic blood products and enhancement in the left frontal lobe. Additional new foci of chronic blood products in the posterior right putamen, right parietal subcortical white matter, and posterior right cerebellum. Unclear at this time if these represent foci of bland hemorrhage or early metastases. Increase in size of right parietal osseous metastasis with minor extraosseous extension. Electronically Signed   By: Macy Mis M.D.   On: 08/22/2021 12:17     Assessment and plan-   # neutropenic fever Afebrile since admission.  Likely due to marrow suppression from chemo and superimposed enteritis.  Cbc showed improved total wbc, add differential  # Acute on chronic anemia,  Hb 6.7, likely due to chemotherapy induced marrow suppression, dilution effect.  Rational and potential side effects of blood transfusion were discussed with patient and also with daughter over the phone.  They agree with the plan.  Recommend 1 unit of PRBC transfusion.  Repeat cbc in AM  #Enterocolitis, likely infectious.   Clinically patient is doing better.   Check infectious etiology work-up. If [place] is above 1, okay to de-escalate antibiotics to oral Cipro and Flagyl.  #Stage IV non-small cell lung cancer, last chemotherapy 1 week ago. Follow-up outpatient at the cancer center. No intervention during current admission. Thank you for allowing me to  participate in the care of this patient.   [name], MD, [name] Hematology Oncology 11/19/2021

## 2021-11-20 DIAGNOSIS — K529 Noninfective gastroenteritis and colitis, unspecified: Secondary | ICD-10-CM | POA: Diagnosis not present

## 2021-11-20 DIAGNOSIS — B348 Other viral infections of unspecified site: Secondary | ICD-10-CM

## 2021-11-20 DIAGNOSIS — C3412 Malignant neoplasm of upper lobe, left bronchus or lung: Secondary | ICD-10-CM | POA: Diagnosis not present

## 2021-11-20 DIAGNOSIS — D709 Neutropenia, unspecified: Secondary | ICD-10-CM | POA: Diagnosis not present

## 2021-11-20 LAB — CBC WITH DIFFERENTIAL/PLATELET
Abs Immature Granulocytes: 0.09 10*3/uL — ABNORMAL HIGH (ref 0.00–0.07)
Basophils Absolute: 0 10*3/uL (ref 0.0–0.1)
Basophils Relative: 1 %
Eosinophils Absolute: 0 10*3/uL (ref 0.0–0.5)
Eosinophils Relative: 1 %
HCT: 23.6 % — ABNORMAL LOW (ref 39.0–52.0)
Hemoglobin: 7.7 g/dL — ABNORMAL LOW (ref 13.0–17.0)
Immature Granulocytes: 4 %
Lymphocytes Relative: 18 %
Lymphs Abs: 0.4 10*3/uL — ABNORMAL LOW (ref 0.7–4.0)
MCH: 30 pg (ref 26.0–34.0)
MCHC: 32.6 g/dL (ref 30.0–36.0)
MCV: 91.8 fL (ref 80.0–100.0)
Monocytes Absolute: 0.5 10*3/uL (ref 0.1–1.0)
Monocytes Relative: 26 %
Neutro Abs: 1.1 10*3/uL — ABNORMAL LOW (ref 1.7–7.7)
Neutrophils Relative %: 50 %
Platelets: 173 10*3/uL (ref 150–400)
RBC: 2.57 MIL/uL — ABNORMAL LOW (ref 4.22–5.81)
RDW: 17.5 % — ABNORMAL HIGH (ref 11.5–15.5)
WBC: 2.1 10*3/uL — ABNORMAL LOW (ref 4.0–10.5)
nRBC: 0 % (ref 0.0–0.2)

## 2021-11-20 LAB — PHOSPHORUS: Phosphorus: 1.7 mg/dL — ABNORMAL LOW (ref 2.5–4.6)

## 2021-11-20 LAB — COMPREHENSIVE METABOLIC PANEL
ALT: 14 U/L (ref 0–44)
AST: 15 U/L (ref 15–41)
Albumin: 2.3 g/dL — ABNORMAL LOW (ref 3.5–5.0)
Alkaline Phosphatase: 59 U/L (ref 38–126)
Anion gap: 5 (ref 5–15)
BUN: 9 mg/dL (ref 8–23)
CO2: 23 mmol/L (ref 22–32)
Calcium: 8 mg/dL — ABNORMAL LOW (ref 8.9–10.3)
Chloride: 109 mmol/L (ref 98–111)
Creatinine, Ser: 0.7 mg/dL (ref 0.61–1.24)
GFR, Estimated: >60 mL/min (ref >60)
Glucose, Bld: 130 mg/dL — ABNORMAL HIGH (ref 70–99)
Potassium: 3.7 mmol/L (ref 3.5–5.1)
Sodium: 137 mmol/L (ref 135–145)
Total Bilirubin: 0.3 mg/dL (ref 0.3–1.2)
Total Protein: 5.6 g/dL — ABNORMAL LOW (ref 6.5–8.1)

## 2021-11-20 LAB — FOLATE: Folate: 10.1 ng/mL (ref >5.9)

## 2021-11-20 LAB — MAGNESIUM: Magnesium: 1.8 mg/dL (ref 1.7–2.4)

## 2021-11-20 LAB — PREPARE RBC (CROSSMATCH)

## 2021-11-20 MED ORDER — CHLORHEXIDINE GLUCONATE CLOTH 2 % EX PADS: 6.0000 | MEDICATED_PAD | Freq: Every day | CUTANEOUS | Status: DC

## 2021-11-20 MED ORDER — METRONIDAZOLE 500 MG PO TABS: 500.0000 mg | ORAL_TABLET | Freq: Three times a day (TID) | ORAL | 0 refills | Status: AC

## 2021-11-20 MED ORDER — SODIUM CHLORIDE 0.9% IV SOLUTION: Freq: Once | INTRAVENOUS | Status: AC

## 2021-11-20 MED ORDER — CIPROFLOXACIN HCL 500 MG PO TABS: 500.0000 mg | ORAL_TABLET | Freq: Two times a day (BID) | ORAL | 0 refills | Status: AC

## 2021-11-20 MED ORDER — SODIUM PHOSPHATES 45 MMOLE/15ML IV SOLN
45.0000 mmol | Freq: Once | INTRAVENOUS | Status: AC
  Administered 2021-11-20: 45 mmol via INTRAVENOUS
  Filled 2021-11-20: qty 15

## 2021-11-20 MED ORDER — POTASSIUM PHOSPHATES 15 MMOLE/5ML IV SOLN: 15.0000 mmol | Freq: Once | INTRAVENOUS | Status: DC

## 2021-11-20 MED ORDER — HEPARIN SOD (PORK) LOCK FLUSH 100 UNIT/ML IV SOLN
500.0000 [IU] | Freq: Once | INTRAVENOUS | Status: AC
  Administered 2021-11-20: 500 [IU] via INTRAVENOUS
  Filled 2021-11-20: qty 5

## 2021-11-20 NOTE — Discharge Summary (Signed)
Physician Discharge Summary   Patient: Micheal Donovan MRN: [id number] DOB: 15-Oct-1950  Admit date:     11/18/2021  Discharge date: 11/20/21  Discharge Physician: Sharen Hones   PCP: [name], MD   Recommendations at discharge:   Follow-up with PCP in 1 week. Follow-up with oncology as scheduled.  Discharge Diagnoses: Principal Problem:   Neutropenic fever ([place]) Active Problems:   Primary non-small cell carcinoma of upper lobe of left lung (HCC)   Lower abdominal pain   Type 2 diabetes mellitus without complications ([place])   Pulmonary embolus (HCC)   Essential hypertension   Dyslipidemia   Symptomatic anemia   Enteritis   Rhinovirus infection   Hypophosphatemia  Resolved Problems:   * No resolved hospital problems. [facility]:  71 y.o. Caucasian male with medical history significant for primary non-small cell lung cancer of the left upper lobe, PE, type 2 diabetes mellitus, essential hypertension, lumbar radiculopathy, history of melanoma, BPH, dyslipidemia and diabetic retinopathy, presented to the ER with acute onset of generalized weakness and fever at home with a temperature of 100.8. Patient is diagnosed with neutropenic fever, was placed on broad-spectrum antibiotics including vancomycin, cefepime and Flagyl. Condition so far had improved, neutrophil had increased to more than 1000, antibiotics changed to oral Flagyl and Cipro.   Assessment and Plan:  * Neutropenic fever ([place]) Primary non-small cell carcinoma of upper lobe of left lung (HCC) Enteritis. Rhinovirus infection. Patient fever is caused by enteritis and a rhinovirus infection.  Condition so far had improved, no additional fever.  Diarrhea has resolved since yesterday.  He was positive for rhinovirus, but no respite symptoms.  No pneumonia. Currently he is medically stable to be discharged.  Hypokalemia. Hypophosphatemia. Potassium has normalized, will give 45 mmol of sodium phosphate  before discharge.  Anemia secondary to chemotherapy. Received 1 unit of PRBC, hemoglobin more stable, follow-up with oncology as outpatient.   Dyslipidemia - We will continue statin therapy.  Essential hypertension Resume Norvasc.  Pulmonary embolus (HCC) Continue Eliquis.  Type 2 diabetes mellitus without complications ([place]) Follow-up with PCP as outpatient.       Consultants: Oncology Procedures performed: None  Disposition: Home Diet recommendation:  Discharge Diet Orders (From admission, onward)     Start     Ordered   11/20/21 0000  Diet - low sodium heart healthy        11/20/21 0957           Cardiac diet DISCHARGE MEDICATION: Allergies as of 11/20/2021       Reactions   Niaspan [niacin]    FLUSHING        Medication List     STOP taking these medications    amLODipine 10 MG tablet Commonly known as: NORVASC   metoprolol succinate 25 MG 24 hr tablet Commonly known as: TOPROL-XL   omeprazole 20 MG tablet Commonly known as: PRILOSEC OTC   topiramate 100 MG tablet Commonly known as: TOPAMAX   traZODone 50 MG tablet Commonly known as: DESYREL       TAKE these medications    apixaban 2.5 MG Tabs tablet Commonly known as: Eliquis Take 1 tablet (2.5 mg total) by mouth 2 (two) times daily.   atorvastatin 40 MG tablet Commonly known as: LIPITOR TAKE 1 TABLET(40 MG) BY MOUTH DAILY   calcium-vitamin D 500-5 MG-MCG tablet Commonly known as: OSCAL WITH D Take 2 tablets by mouth daily.   ciprofloxacin 500 MG tablet Commonly known as: Cipro Take  1 tablet (500 mg total) by mouth 2 (two) times daily for 3 days.   dexamethasone 4 MG tablet Commonly known as: DECADRON Take 1 tab every 12 hours the day before pemetrexed chemo, then take 2 tabs once a day for 3 days starting the day after carboplatin.   folic acid 1 MG tablet Commonly known as: FOLVITE Take 1 tablet (1 mg total) by mouth daily. Start 7 days before pemetrexed  chemotherapy. Continue until 21 days after pemetrexed completed.   insulin glargine 100 UNIT/ML Solostar Pen Commonly known as: LANTUS Inject 25 Units into the skin daily. What changed:  how much to take when to take this reasons to take this   LORazepam 0.5 MG tablet Commonly known as: ATIVAN Take 1 tablet (0.5 mg total) by mouth See admin instructions. Take 1 tablet 30 minutes prior to imaging procedure, may take another tablet if needed.Avoid driving after taking medication.   losartan 50 MG tablet Commonly known as: COZAAR TAKE 1 TABLET(50 MG) BY MOUTH DAILY   metroNIDAZOLE 500 MG tablet Commonly known as: Flagyl Take 1 tablet (500 mg total) by mouth 3 (three) times daily for 3 days.   NovoLOG FlexPen 100 UNIT/ML FlexPen Generic drug: insulin aspart Inject into the skin.   oxyCODONE-acetaminophen 7.5-325 MG tablet Commonly known as: Percocet Take 1 tablet by mouth every 4 (four) hours as needed for severe pain.   pantoprazole 40 MG tablet Commonly known as: PROTONIX Take 1 tablet (40 mg total) by mouth daily at 6 (six) AM.   potassium chloride 10 MEQ tablet Commonly known as: KLOR-CON M TAKE 1 TABLET(10 MEQ) BY MOUTH DAILY   Santyl 250 UNIT/GM ointment Generic drug: collagenase Apply 1 Application topically daily.   tamsulosin 0.4 MG Caps capsule Commonly known as: Flomax Take 1 capsule (0.4 mg total) by mouth daily.   UNABLE TO FIND PLEASE CHECK FASTING BLOOD GLUCOSE EVERY MORNING        Follow-up Information     [name], MD Follow up in 1 week(s).   Specialty: Family Medicine Contact information: [address] [place] [address] [phone]                Discharge Exam: Danley Danker Weights   11/18/21 1614  Weight: 63.5 kg   General exam: Appears calm and comfortable  Respiratory system: Clear to auscultation. Respiratory effort normal. Cardiovascular system: S1 & S2 heard, RRR. No JVD, murmurs, rubs, gallops or  clicks. No pedal edema. Gastrointestinal system: Abdomen is nondistended, soft and nontender. No organomegaly or masses felt. Normal bowel sounds heard. Central nervous system: Alert and oriented. No focal neurological deficits. Extremities: Symmetric 5 x 5 power. Skin: No rashes, lesions or ulcers Psychiatry: Judgement and insight appear normal. Mood & affect appropriate.    Condition at discharge: good  The results of significant diagnostics from this hospitalization (including imaging, microbiology, ancillary and laboratory) are listed below for reference.   Imaging Studies: CT ABDOMEN PELVIS W CONTRAST  Result Date: 11/18/2021 CLINICAL DATA:  Acute abdominal pain. Weakness. Fevers. Patient with history of lung cancer and osseous metastasis. EXAM: CT ABDOMEN AND PELVIS WITH CONTRAST TECHNIQUE: Multidetector CT imaging of the abdomen and pelvis was performed using the standard protocol following bolus administration of intravenous contrast. RADIATION DOSE REDUCTION: This exam was performed according to the departmental dose-optimization program which includes automated exposure control, adjustment of the mA and/or kV according to patient size and/or use of iterative reconstruction technique. CONTRAST:  114mL OMNIPAQUE  IOHEXOL 300 MG/ML  SOLN COMPARISON:  PET CT 10/29/2021, abdominopelvic CT 06/18/2021 FINDINGS: Lower chest: No basilar consolidation or pleural effusion. The previous basilar pulmonary embolus is not seen on the current exam. Hepatobiliary: Focal fatty infiltration adjacent to the falciform ligament. No suspicious liver lesion Gallbladder physiologically distended, no calcified stone. No biliary dilatation. Pancreas: No ductal dilatation or inflammation.  Mild fatty atrophy. Spleen: Normal in size without focal abnormality. Adrenals/Urinary Tract: Normal adrenal glands. No hydronephrosis or perinephric edema. Homogeneous renal enhancement with symmetric excretion on delayed phase  imaging. Cortical scarring in the lateral mid left kidney. Left renal cyst is well as bilateral low-density renal lesions are too small to accurately characterize. No specific imaging follow-up is recommended. Urinary bladder is physiologically distended without wall thickening. Stomach/Bowel: Wall thickening with perienteric edema and enhancement involving a moderate length segment of distal ileum, terminating just before the terminal ileum. Just proximal to this inflamed small bowel segments is fecalization of small bowel contents. More proximal small bowel mildly prominent and fluid-filled. There is no bowel pneumatosis. There is no associated colonic inflammation. The appendix tentatively visualized and uninflamed. Unremarkable appearance of the stomach. No colonic inflammation. Vascular/Lymphatic: Moderate to advanced aortic and branch atherosclerosis. No aortic aneurysm. The portal and superior mesenteric veins are patent. There is perivascular edema about the small bowel mesenteric vessels but no embolic disease. No bulky adenopathy. Reproductive: Prostate is unremarkable. Other: Inflammatory change in the right lower quadrant related to small bowel inflammation. Fat stranding and a small amount of non organized free fluid. No focal fluid collection or free air. Minimal fat in the inguinal canals. Musculoskeletal: Mild L2 superior endplate compression deformity appears similar to recent PET, hypermetabolic lesion on 68/02/7516 PET. There is a large Schmorl's node involving the superior endplate of L5, likely pathologic given prior hypermetabolic lesion on 00/17/4944 PET. Patient's known osseous metastatic disease is better appreciated on recent PET. Cortical thinning with ill-defined lytic lesion involving the right anterior iliac bone, as seen on prior PET. Again seen prior left lateral lower rib fracture. IMPRESSION: 1. Moderate length segment of inflamed distal ileum, terminating just before the terminal  ileum, consistent with enteritis. This may be infectious or inflammatory. No perforation or abscess. 2. Fecalization of small bowel contents just proximal to the inflamed small bowel segment likely related to delayed transit time. No obstruction. 3. Patient's known osseous metastatic disease is better appreciated on recent PET. Aortic Atherosclerosis (ICD10-I70.0). Electronically Signed   By: Keith Rake M.D.   On: 11/18/2021 20:44   DG Chest 2 View  Result Date: 11/18/2021 CLINICAL DATA:  Weakness.  History of lung cancer. EXAM: CHEST - 2 VIEW COMPARISON:  June 12, 2021. FINDINGS: The heart size and mediastinal contours are within normal limits. Right internal jugular Port-A-Cath is noted with distal tip in expected position of SVC. Right lung is clear. Irregular nodular density is noted in left midlung which is significantly smaller compared to prior exam consistent with lung mass noted on prior exam. The visualized skeletal structures are unremarkable. IMPRESSION: Left midlung mass noted on prior exam is smaller currently. Status post right internal jugular Port-A-Cath placement. No other abnormality is noted. Electronically Signed   By: Marijo Conception M.D.   On: 11/18/2021 16:47   NM PET Image Restag (PS) Skull Base To Thigh  Result Date: 10/30/2021 CLINICAL DATA:  Subsequent treatment strategy for lung cancer. EXAM: NUCLEAR MEDICINE PET SKULL BASE TO THIGH TECHNIQUE: 8.6 mCi F-18 FDG was injected intravenously. Full-ring PET  imaging was performed from the skull base to thigh after the radiotracer. CT data was obtained and used for attenuation correction and anatomic localization. Fasting blood glucose: 147 mg/dl COMPARISON:  07/24/2021. FINDINGS: Mediastinal blood pool activity: SUV max 1.9 Liver activity: SUV max NA NECK: No abnormal hypermetabolism. Incidental CT findings: Retention cysts or polyps in the maxillary sinuses. CHEST: Left upper lobe mass has decreased in size and hypermetabolism,  now measuring approximately 1.8 x 2.2 cm, SUV max 2.2, compared to 3.8 x 3.9 cm, SUV max 19.6. No hypermetabolic mediastinal, hilar or axillary lymph nodes. Incidental CT findings: Right IJ Port-A-Cath terminates in the right atrium. Atherosclerotic calcification of the aorta and coronary arteries. Heart is at the upper limits of normal in size. No pericardial effusion. 6 mm posterior left upper lobe nodule (2/68), likely unchanged and too small for PET resolution. ABDOMEN/PELVIS: No abnormal hypermetabolism in the liver, adrenal glands, spleen or pancreas. No hypermetabolic lymph nodes. Incidental CT findings: Liver, gallbladder adrenal glands are unremarkable. Subcentimeter low-attenuation lesion in the interpolar right kidney, too small to characterize. No specific follow-up necessary. Kidneys, spleen, pancreas, stomach and bowel are otherwise grossly unremarkable. Atherosclerotic calcification of the aorta. SKELETON: Interval decrease in hypermetabolism associated with bone metastases. Index lytic lesion in the right iliac wing has decreased to SUV max 2.1, from 15.9. Incidental CT findings: Degenerative changes in the spine. Healing pathologic fracture of the lateral left seventh rib. IMPRESSION: 1. Interval response to therapy as evidenced by a small residual left upper lobe nodule with decreased hypermetabolism, no residual hypermetabolic adenopathy and decreased hypermetabolism associated with osseous metastases. 2. 6 mm posterior left upper lobe nodule, likely stable. Recommend attention on follow-up. 3. Aortic atherosclerosis (ICD10-I70.0). Coronary artery calcification. Electronically Signed   By: Lorin Picket M.D.   On: 10/30/2021 08:41    Microbiology: Results for orders placed or performed during the hospital encounter of 11/18/21  Culture, blood (Routine x 2)     Status: None (Preliminary result)   Collection Time: 11/18/21  4:19 PM   Specimen: BLOOD  Result Value Ref Range Status    Specimen Description BLOOD RIGHT ANTECUBITAL  Final   Special Requests   Final    BOTTLES DRAWN AEROBIC AND ANAEROBIC Blood Culture adequate volume   Culture   Final    NO GROWTH < 24 HOURS Performed at [facility], [address], [place], [place] [address]    Report Status PENDING  Incomplete  Culture, blood (Routine x 2)     Status: None (Preliminary result)   Collection Time: 11/18/21  5:57 PM   Specimen: BLOOD  Result Value Ref Range Status   Specimen Description BLOOD PORTA CATH  Final   Special Requests   Final    BOTTLES DRAWN AEROBIC AND ANAEROBIC Blood Culture adequate volume   Culture   Final    NO GROWTH < 12 HOURS Performed at [facility], [address] [place], [place] [address]    Report Status PENDING  Incomplete  SARS Coronavirus 2 by RT PCR (hospital order, performed in [facility] hospital lab) *cepheid single result test* Anterior Nasal Swab     Status: None   Collection Time: 11/18/21  7:07 PM   Specimen: Anterior Nasal Swab  Result Value Ref Range Status   SARS Coronavirus 2 by RT PCR NEGATIVE NEGATIVE Final    Comment: (NOTE) SARS-CoV-2 target nucleic acids are NOT DETECTED.  The SARS-CoV-2 RNA is generally detectable in upper and lower respiratory specimens during  the acute phase of infection. The lowest concentration of SARS-CoV-2 viral copies this assay can detect is 250 copies / mL. A negative result does not preclude SARS-CoV-2 infection and should not be used as the sole basis for treatment or other patient management decisions.  A negative result may occur with improper specimen collection / handling, submission of specimen other than nasopharyngeal swab, presence of viral mutation(s) within the areas targeted by this assay, and inadequate number of viral copies (<250 copies / mL). A negative result must be combined with clinical observations, patient history, and epidemiological information.  Fact Sheet for Patients:    https://www.patel.info/  Fact Sheet for Healthcare Providers: https://hall.com/  This test is not yet approved or  cleared by the [place] FDA and has been authorized for detection and/or diagnosis of SARS-CoV-2 by FDA under an Emergency Use Authorization (EUA).  This EUA will remain in effect (meaning this test can be used) for the duration of the COVID-19 declaration under Section 564(b)(1) of the Act, 21 U.S.C. section 360bbb-3(b)(1), unless the authorization is terminated or revoked sooner.  Performed at [facility], [place], [place] [address]   Respiratory (~20 pathogens) panel by PCR     Status: Abnormal   Collection Time: 11/19/21 11:06 AM   Specimen: Nasopharyngeal Swab; Respiratory  Result Value Ref Range Status   Adenovirus NOT DETECTED NOT DETECTED Final   Coronavirus 229E NOT DETECTED NOT DETECTED Final    Comment: (NOTE) The Coronavirus on the Respiratory Panel, DOES NOT test for the novel  Coronavirus (2019 nCoV)    Coronavirus HKU1 NOT DETECTED NOT DETECTED Final   Coronavirus NL63 NOT DETECTED NOT DETECTED Final   Coronavirus OC43 NOT DETECTED NOT DETECTED Final   Metapneumovirus NOT DETECTED NOT DETECTED Final   Rhinovirus / Enterovirus DETECTED (A) NOT DETECTED Final   Influenza A NOT DETECTED NOT DETECTED Final   Influenza B NOT DETECTED NOT DETECTED Final   Parainfluenza Virus 1 NOT DETECTED NOT DETECTED Final   Parainfluenza Virus 2 NOT DETECTED NOT DETECTED Final   Parainfluenza Virus 3 NOT DETECTED NOT DETECTED Final   Parainfluenza Virus 4 NOT DETECTED NOT DETECTED Final   Respiratory Syncytial Virus NOT DETECTED NOT DETECTED Final   Bordetella pertussis NOT DETECTED NOT DETECTED Final   Bordetella Parapertussis NOT DETECTED NOT DETECTED Final   Chlamydophila pneumoniae NOT DETECTED NOT DETECTED Final   Mycoplasma pneumoniae NOT DETECTED NOT DETECTED Final    Comment:  Performed at [facility], [address], [place], [place] [address]    Labs: CBC: Recent Labs  Lab 11/18/21 1619 11/19/21 0527 11/19/21 0825 11/19/21 1742 11/20/21 0430  WBC 1.7*  1.7* 2.1* 1.9*  --  2.1*  NEUTROABS 0.9*  --  1.2*  --  1.1*  HGB 8.8*  8.7* 6.7* 7.3* 8.5* 7.7*  HCT 27.9*  27.5* 21.0* 23.0* 25.8* 23.6*  MCV 95.2  95.2 94.2 96.2  --  91.8  PLT 298  303 216 221  --  315   Basic Metabolic Panel: Recent Labs  Lab 11/18/21 1619 11/19/21 0527 11/20/21 0430  NA 133* 137 137  K 3.7 3.4* 3.7  CL 99 105 109  CO2 23 26 23   GLUCOSE 165* 123* 130*  BUN 10 9 9   CREATININE 0.67 0.72 0.70  CALCIUM 8.4* 7.6* 8.0*  MG  --   --  1.8  PHOS  --   --  1.7*   Liver Function Tests: Recent Labs  Lab 11/20/21 0430  AST 15  ALT 14  ALKPHOS 59  BILITOT 0.3  PROT 5.6*  ALBUMIN 2.3*   CBG: No results for input(s): "GLUCAP" in the last 168 hours.  Discharge time spent: greater than 30 minutes.  Signed: Sharen Hones, MD [facility] 11/20/2021

## 2021-11-20 NOTE — Consult Note (Signed)
[facility] CM Inpatient Consult   11/20/2021  FATEH KINDLE Mar 31, 1950 [id number]  .[facility] [ACO] Patient: [employer]  Primary Care Provider:  [name], MD, [facility] Liaison coverage for [facility]  Patient screened for hospitalization with noted extreme high risk score for unplanned readmission risk and  to assess for potential [facility] service needs for post hospital transition.   Spoke with the patient via hospital operator assisted call to the bedside phone.  Spoke with the patient, HIPPA verified. Patient states that he felt that he had all of his needs met with his oncology team but follows up with his PCP as needed.  Plan:  No needs assessed.  Patient's PCP is listed to provide the transition of care follow up..    For questions contact:   Natividad Brood, RN BSN [facility] Liaison  [phone] business mobile phone Toll free office [phone]  Fax number: [phone] [email or web address]

## 2021-11-20 NOTE — TOC Initial Note (Signed)
Transition of Care [facility]) - Initial/Assessment Note    Patient Details  Name: Micheal Donovan MRN: [id number] Date of Birth: May 21, 1950  Transition of [facility]) CM/SW Contact:    Laurena Slimmer, RN Phone Number: 11/20/2021, 10:20 AM  Clinical Narrative:        Spoke with patient regarding d/c plan           Admitted for: Neutropenic fever Admitted from:Home with spouse PCP:Dr. Jenna Luo Pharmacy:[facility].  Current home health/prior home health/DME:[facility] Transportation: Drives himself, Son-in-law will transport home           Patient Goals and CMS Choice        Expected Discharge Plan and Services           Expected Discharge Date: 11/20/21                                    Prior Living Arrangements/Services                       Activities of Daily Living Home Assistive Devices/Equipment: Eyeglasses, Wheelchair (reading glasses/ wheelchair for long distances) ADL Screening (condition at time of admission) Patient's cognitive ability adequate to safely complete daily activities?: Yes Is the patient deaf or have difficulty hearing?: No Does the patient have difficulty seeing, even when wearing glasses/contacts?: No Does the patient have difficulty concentrating, remembering, or making decisions?: No Patient able to express need for assistance with ADLs?: Yes Does the patient have difficulty dressing or bathing?: No Independently performs ADLs?: Yes (appropriate for developmental age) Does the patient have difficulty walking or climbing stairs?: No Weakness of Legs: None Weakness of Arms/Hands: None  Permission Sought/Granted                  Emotional Assessment              Admission diagnosis:  Neutropenic fever ([place]) [D70.9, R50.81] Patient Active Problem List   Diagnosis Date Noted   Rhinovirus infection 11/20/2021   Hypophosphatemia 11/20/2021   Symptomatic anemia    Enteritis    Neutropenic fever  ([place]) 11/18/2021   Essential hypertension 11/18/2021   Dyslipidemia 11/18/2021   Lower abdominal pain 11/18/2021   Frequent urination 11/11/2021   B12 deficiency anemia 09/27/2021   Neoplasm related pain 08/16/2021   Anemia due to antineoplastic chemotherapy 08/16/2021   Metastasis to bone ([place]) 08/06/2021   Goals of care, counseling/discussion 07/23/2021   Pulmonary embolus ([place]) 07/23/2021   Encounter for antineoplastic chemotherapy 07/17/2021   Primary non-small cell carcinoma of upper lobe of left lung ([place]) 07/16/2021   Hemoptysis 07/16/2021   Diabetes mellitus ([place]) 07/02/2021   Pressure injury of skin 06/21/2021   Headache 06/13/2021   Hyperlipidemia 06/12/2021   CAP (community acquired pneumonia) 06/12/2021   Cerebellar bleed ([place])    Intraparenchymal hemorrhage of brain ([place]) 06/11/2021   Type 2 diabetes mellitus with hyperglycemia ([place]) 06/11/2021   Pseudohyponatremia 06/11/2021   Dehydration 06/11/2021   Leukocytosis 06/11/2021   Thrombocytosis 06/11/2021   Hypercalcemia 06/11/2021   Lumbar radiculopathy 03/15/2021   Lumbar pain 01/10/2020   Carotid stenosis, bilateral 11/07/2011   Type 2 diabetes mellitus without complications ([place]) 93/57/0177   HYPERCHOLESTEROLEMIA 02/07/2010   Benign hypertension 02/07/2010   CAROTID BRUIT, LEFT 02/07/2010   CHEST PAIN 02/07/2010   PCP:  [name], MD Pharmacy:   [facility] [phone] -  [place], [place] [address] Phone: [phone] Fax: [phone]     Social Determinants of Health (SDOH) Interventions    Readmission Risk Interventions    11/20/2021   10:17 AM  Readmission Risk Prevention Plan  Transportation Screening Complete  PCP or Specialist Appt within 3-5 Days Complete  Palliative Care Screening Not Applicable  Medication Review (RN Care Manager) Complete

## 2021-11-21 ENCOUNTER — Inpatient Hospital Stay: Payer: PPO

## 2021-11-21 ENCOUNTER — Telehealth: Payer: Self-pay | Admitting: *Deleted

## 2021-11-21 ENCOUNTER — Encounter: Payer: Self-pay | Admitting: *Deleted

## 2021-11-21 LAB — BPAM RBC
Blood Product Expiration Date: 202309272359
Blood Product Expiration Date: 202309302359
ISSUE DATE / TIME: 202309191324
ISSUE DATE / TIME: 202309201421
Unit Type and Rh: 5100
Unit Type and Rh: 5100

## 2021-11-21 LAB — TYPE AND SCREEN
ABO/RH(D): O POS
Antibody Screen: NEGATIVE
Unit division: 0
Unit division: 0

## 2021-11-21 NOTE — Patient Outreach (Signed)
  Care Coordination [facility] Note Transition Care Management Follow-up Telephone Call Date of discharge and from where: 11/20/21 from [facility] How have you been since you were released from the hospital? "I'm doing ok" Any questions or concerns? No  Items Reviewed: Did the pt receive and understand the discharge instructions provided? Yes  Medications obtained and verified? Yes  Other? No  Any new allergies since your discharge? No  Dietary orders reviewed? Yes Do you have support at home? Yes   Home Care and Equipment/Supplies: Were home health services ordered? no If so, what is the name of the agency? N.a  Has the agency set up a time to come to the patient's home? not applicable Were any new equipment or medical supplies ordered?  No What is the name of the medical supply agency? N/a Were you able to get the supplies/equipment? not applicable Do you have any questions related to the use of the equipment or supplies? No  Functional Questionnaire: (I = Independent and D = Dependent) ADLs: I  Bathing/Dressing- I  Meal Prep- I  Eating- I  Maintaining continence- I  Transferring/Ambulation- I  Managing Meds- I  Follow up appointments reviewed:  PCP Hospital f/u appt confirmed? Yes  Scheduled to see Dr Dennard Schaumann on 11/26/21 @ 2:00. [facility] f/u appt confirmed? Yes  Scheduled to see Noreene Filbert, MD on 12/02/21 @ 1:00. Are transportation arrangements needed? No  If their condition worsens, is the pt aware to call PCP or go to the Emergency Dept.? Yes Was the patient provided with contact information for the PCP's office or ED? Yes Was to pt encouraged to call back with questions or concerns? Yes  SDOH assessments and interventions completed:   Yes  Care Coordination Interventions Activated:  No   Care Coordination Interventions:  No Care Coordination interventions needed at this time.   Encounter Outcome:  Pt. Visit Completed    Chong Sicilian, BSN,  RN-BC RN Care Coordinator [place] Direct Dial: [phone] Main #: [phone]

## 2021-11-22 ENCOUNTER — Other Ambulatory Visit: Payer: Self-pay

## 2021-11-22 DIAGNOSIS — C3412 Malignant neoplasm of upper lobe, left bronchus or lung: Secondary | ICD-10-CM

## 2021-11-23 LAB — CULTURE, BLOOD (ROUTINE X 2)
Culture: NO GROWTH
Culture: NO GROWTH
Special Requests: ADEQUATE
Special Requests: ADEQUATE

## 2021-11-26 ENCOUNTER — Inpatient Hospital Stay: Payer: PPO | Admitting: Family Medicine

## 2021-11-26 ENCOUNTER — Inpatient Hospital Stay: Payer: PPO

## 2021-11-26 NOTE — Progress Notes (Deleted)
Subjective:    Patient ID: Micheal Donovan, male    DOB: 06-03-1950, 71 y.o.   MRN: [id number]  HPI Admit date:     11/18/2021  Discharge date: 11/20/21  Discharge Physician: Sharen Hones    PCP: [name], MD    Recommendations at discharge:    Follow-up with PCP in 1 week. Follow-up with oncology as scheduled.   Discharge Diagnoses: Principal Problem:   Neutropenic fever ([place]) Active Problems:   Primary non-small cell carcinoma of upper lobe of left lung (HCC)   Lower abdominal pain   Type 2 diabetes mellitus without complications (HCC)   Pulmonary embolus (HCC)   Essential hypertension   Dyslipidemia   Symptomatic anemia   Enteritis   Rhinovirus infection   Hypophosphatemia   Hospital Course:  71 y.o. Caucasian male with medical history significant for primary non-small cell lung cancer of the left upper lobe, PE, type 2 diabetes mellitus, essential hypertension, lumbar radiculopathy, history of melanoma, BPH, dyslipidemia and diabetic retinopathy, presented to the ER with acute onset of generalized weakness and fever at home with a temperature of 100.8. Patient is diagnosed with neutropenic fever, was placed on broad-spectrum antibiotics including vancomycin, cefepime and Flagyl. Condition so far had improved, neutrophil had increased to more than 1000, antibiotics changed to oral Flagyl and Cipro.     Assessment and Plan:   * Neutropenic fever ([place]) Primary non-small cell carcinoma of upper lobe of left lung (HCC) Enteritis. Rhinovirus infection. Patient fever is caused by enteritis and a rhinovirus infection.  Condition so far had improved, no additional fever.  Diarrhea has resolved since yesterday.  He was positive for rhinovirus, but no respite symptoms.  No pneumonia. Currently he is medically stable to be discharged.   Hypokalemia. Hypophosphatemia. Potassium has normalized, will give 45 mmol of sodium phosphate before discharge.   Anemia secondary  to chemotherapy. Received 1 unit of PRBC, hemoglobin more stable, follow-up with oncology as outpatient.     Dyslipidemia - We will continue statin therapy.   Essential hypertension Resume Norvasc.   Pulmonary embolus (HCC) Continue Eliquis.   Type 2 diabetes mellitus without complications ([place]) Follow-up with PCP as outpatient.      11/26/21 I last saw the patient in May and he has not followed up with me since to address his diabetes.   Past Medical History:  Diagnosis Date   Allergy    BPH (benign prostatic hypertrophy)    Cancer (HCC)    Melanoma on Neck    2008   Carotid artery occlusion    Diabetes mellitus    type 2   ED (erectile dysfunction)    GERD (gastroesophageal reflux disease)    Hemorrhagic stroke ([place]) 06/2021   Hyperlipidemia    Hypertension    Lung cancer ([place])    Retinopathy due to secondary DM [facility])    Past Surgical History:  Procedure Laterality Date   BRONCHIAL NEEDLE ASPIRATION BIOPSY  06/17/2021   Procedure: BRONCHIAL NEEDLE ASPIRATION BIOPSIES;  Surgeon: Candee Furbish, MD;  Location: [facility] ENDOSCOPY;  Service: Pulmonary;;   IR IMAGING GUIDED PORT INSERTION  08/05/2021   MELANOMA EXCISION  2008   Left side of neck   RADIOLOGY WITH ANESTHESIA N/A 04/05/2020   Procedure: MRI SPINE WITOUT CONTRAST;  Surgeon: Radiologist, Medication, MD;  Location: [place];  Service: Radiology;  Laterality: N/A;   RADIOLOGY WITH ANESTHESIA N/A 08/22/2021   Procedure: MRI BRAIN WITH AND WITHOUT CONTRAST  WITH ANESTHESIA;  Surgeon:  Radiologist, Medication, MD;  Location: [place];  Service: Radiology;  Laterality: N/A;   TONSILLECTOMY     VIDEO BRONCHOSCOPY WITH ENDOBRONCHIAL ULTRASOUND N/A 06/17/2021   Procedure: VIDEO BRONCHOSCOPY WITH ENDOBRONCHIAL ULTRASOUND;  Surgeon: Candee Furbish, MD;  Location[facility] ENDOSCOPY;  Service: Pulmonary;  Laterality: N/A;   Current Outpatient Medications on File Prior to Visit  Medication Sig Dispense Refill   apixaban (ELIQUIS) 2.5 MG TABS  tablet Take 1 tablet (2.5 mg total) by mouth 2 (two) times daily. 60 tablet 3   atorvastatin (LIPITOR) 40 MG tablet TAKE 1 TABLET(40 MG) BY MOUTH DAILY 90 tablet 2   calcium-vitamin D (OSCAL WITH D) 500-5 MG-MCG tablet Take 2 tablets by mouth daily. 60 tablet 3   collagenase (SANTYL) 250 UNIT/GM ointment Apply 1 Application topically daily. 15 g 0   dexamethasone (DECADRON) 4 MG tablet Take 1 tab every 12 hours the day before pemetrexed chemo, then take 2 tabs once a day for 3 days starting the day after carboplatin. 30 tablet 1   folic acid (FOLVITE) 1 MG tablet Take 1 tablet (1 mg total) by mouth daily. Start 7 days before pemetrexed chemotherapy. Continue until 21 days after pemetrexed completed. 100 tablet 3   insulin glargine (LANTUS) 100 UNIT/ML Solostar Pen Inject 25 Units into the skin daily. (Patient taking differently: Inject 15-30 Units into the skin daily as needed (high blood sugar).) 15 mL 11   LORazepam (ATIVAN) 0.5 MG tablet Take 1 tablet (0.5 mg total) by mouth See admin instructions. Take 1 tablet 30 minutes prior to imaging procedure, may take another tablet if needed.Avoid driving after taking medication. 2 tablet 0   losartan (COZAAR) 50 MG tablet TAKE 1 TABLET(50 MG) BY MOUTH DAILY (Patient not taking: Reported on 11/19/2021) 90 tablet 0   NOVOLOG FLEXPEN 100 UNIT/ML FlexPen Inject into the skin. (Patient not taking: Reported on 11/19/2021)     oxyCODONE-acetaminophen (PERCOCET) 7.5-325 MG tablet Take 1 tablet by mouth every 4 (four) hours as needed for severe pain. 60 tablet 0   pantoprazole (PROTONIX) 40 MG tablet Take 1 tablet (40 mg total) by mouth daily at 6 (six) AM. 90 tablet 0   potassium chloride (KLOR-CON M) 10 MEQ tablet TAKE 1 TABLET(10 MEQ) BY MOUTH DAILY 90 tablet 0   tamsulosin (FLOMAX) 0.4 MG CAPS capsule Take 1 capsule (0.4 mg total) by mouth daily. 90 capsule 0   UNABLE TO FIND PLEASE CHECK FASTING BLOOD GLUCOSE EVERY MORNING 1 each 0   [DISCONTINUED]  prochlorperazine (COMPAZINE) 10 MG tablet Take 1 tablet (10 mg total) by mouth every 6 (six) hours as needed (Nausea or vomiting). 30 tablet 1   No current facility-administered medications on file prior to visit.     Allergies  Allergen Reactions   Niaspan [Niacin]     FLUSHING   Social History   Socioeconomic History   Marital status: Married    Spouse name: Not on file   Number of children: Not on file   Years of education: Not on file   Highest education level: Not on file  Occupational History   Not on file  Tobacco Use   Smoking status: Former    Types: Cigarettes    Quit date: 07/21/1990    Years since quitting: 31.3   Smokeless tobacco: Never  Vaping Use   Vaping Use: Never used  Substance and Sexual Activity   Alcohol use: No   Drug use: No   Sexual activity: Not on file  Other Topics Concern   Not on file  Social History Narrative   Not on file   Social Determinants of Health   Financial Resource Strain: Low Risk  (11/21/2021)   Overall Financial Resource Strain (CARDIA)    Difficulty of Paying Living Expenses: Not hard at all  Food Insecurity: No Food Insecurity (11/19/2021)   Hunger Vital Sign    Worried About Running Out of Food in the Last Year: Never true    Ran Out of Food in the Last Year: Never true  Transportation Needs: No Transportation Needs (11/21/2021)   PRAPARE - Hydrologist (Medical): No    Lack of Transportation (Non-Medical): No  Physical Activity: Inactive (07/31/2021)   Exercise Vital Sign    Days of Exercise per Week: 0 days    Minutes of Exercise per Session: 0 min  Stress: Unknown (07/31/2021)   [place]    Feeling of Stress : Patient refused  Social Connections: Unknown (07/31/2021)   Social Connection and Isolation Panel [NHANES]    Frequency of Communication with Friends and Family: Three times a week    Frequency of Social  Gatherings with Friends and Family: Three times a week    Attends Religious Services: Patient refused    Active Member of Clubs or Organizations: Patient refused    Attends Archivist Meetings: Patient refused    Marital Status: Married  Human resources officer Violence: Not At Risk (07/31/2021)   Humiliation, Afraid, Rape, and Kick questionnaire    Fear of Current or Ex-Partner: No    Emotionally Abused: No    Physically Abused: No    Sexually Abused: No    Past Medical History:  Diagnosis Date   Allergy    BPH (benign prostatic hypertrophy)    Cancer ([place])    Melanoma on Neck    2008   Carotid artery occlusion    Diabetes mellitus    type 2   ED (erectile dysfunction)    GERD (gastroesophageal reflux disease)    Hemorrhagic stroke ([place]) 06/2021   Hyperlipidemia    Hypertension    Lung cancer ([place])    Retinopathy due to secondary DM [facility])    Past Surgical History:  Procedure Laterality Date   BRONCHIAL NEEDLE ASPIRATION BIOPSY  06/17/2021   Procedure: BRONCHIAL NEEDLE ASPIRATION BIOPSIES;  Surgeon: Candee Furbish, MD;  Location: [place];  Service: Pulmonary;;   IR IMAGING GUIDED PORT INSERTION  08/05/2021   MELANOMA EXCISION  2008   Left side of neck   RADIOLOGY WITH ANESTHESIA N/A 04/05/2020   Procedure: MRI SPINE WITOUT CONTRAST;  Surgeon: Radiologist, Medication, MD;  Location: [place];  Service: Radiology;  Laterality: N/A;   RADIOLOGY WITH ANESTHESIA N/A 08/22/2021   Procedure: MRI BRAIN WITH AND WITHOUT CONTRAST  WITH ANESTHESIA;  Surgeon: Radiologist, Medication, MD;  Location: [place];  Service: Radiology;  Laterality: N/A;   TONSILLECTOMY     VIDEO BRONCHOSCOPY WITH ENDOBRONCHIAL ULTRASOUND N/A 06/17/2021   Procedure: VIDEO BRONCHOSCOPY WITH ENDOBRONCHIAL ULTRASOUND;  Surgeon: Candee Furbish, MD;  Location: [facility] ENDOSCOPY;  Service: Pulmonary;  Laterality: N/A;   Current Outpatient Medications on File Prior to Visit  Medication Sig Dispense Refill   apixaban  (ELIQUIS) 2.5 MG TABS tablet Take 1 tablet (2.5 mg total) by mouth 2 (two) times daily. 60 tablet 3   atorvastatin (LIPITOR) 40 MG tablet TAKE 1 TABLET(40 MG) BY MOUTH DAILY 90 tablet 2  calcium-vitamin D (OSCAL WITH D) 500-5 MG-MCG tablet Take 2 tablets by mouth daily. 60 tablet 3   collagenase (SANTYL) 250 UNIT/GM ointment Apply 1 Application topically daily. 15 g 0   dexamethasone (DECADRON) 4 MG tablet Take 1 tab every 12 hours the day before pemetrexed chemo, then take 2 tabs once a day for 3 days starting the day after carboplatin. 30 tablet 1   folic acid (FOLVITE) 1 MG tablet Take 1 tablet (1 mg total) by mouth daily. Start 7 days before pemetrexed chemotherapy. Continue until 21 days after pemetrexed completed. 100 tablet 3   insulin glargine (LANTUS) 100 UNIT/ML Solostar Pen Inject 25 Units into the skin daily. (Patient taking differently: Inject 15-30 Units into the skin daily as needed (high blood sugar).) 15 mL 11   LORazepam (ATIVAN) 0.5 MG tablet Take 1 tablet (0.5 mg total) by mouth See admin instructions. Take 1 tablet 30 minutes prior to imaging procedure, may take another tablet if needed.Avoid driving after taking medication. 2 tablet 0   losartan (COZAAR) 50 MG tablet TAKE 1 TABLET(50 MG) BY MOUTH DAILY (Patient not taking: Reported on 11/19/2021) 90 tablet 0   NOVOLOG FLEXPEN 100 UNIT/ML FlexPen Inject into the skin. (Patient not taking: Reported on 11/19/2021)     oxyCODONE-acetaminophen (PERCOCET) 7.5-325 MG tablet Take 1 tablet by mouth every 4 (four) hours as needed for severe pain. 60 tablet 0   pantoprazole (PROTONIX) 40 MG tablet Take 1 tablet (40 mg total) by mouth daily at 6 (six) AM. 90 tablet 0   potassium chloride (KLOR-CON M) 10 MEQ tablet TAKE 1 TABLET(10 MEQ) BY MOUTH DAILY 90 tablet 0   tamsulosin (FLOMAX) 0.4 MG CAPS capsule Take 1 capsule (0.4 mg total) by mouth daily. 90 capsule 0   UNABLE TO FIND PLEASE CHECK FASTING BLOOD GLUCOSE EVERY MORNING 1 each 0    [DISCONTINUED] prochlorperazine (COMPAZINE) 10 MG tablet Take 1 tablet (10 mg total) by mouth every 6 (six) hours as needed (Nausea or vomiting). 30 tablet 1   No current facility-administered medications on file prior to visit.   Allergies  Allergen Reactions   Niaspan [Niacin]     FLUSHING   Social History   Socioeconomic History   Marital status: Married    Spouse name: Not on file   Number of children: Not on file   Years of education: Not on file   Highest education level: Not on file  Occupational History   Not on file  Tobacco Use   Smoking status: Former    Types: Cigarettes    Quit date: 07/21/1990    Years since quitting: 31.3   Smokeless tobacco: Never  Vaping Use   Vaping Use: Never used  Substance and Sexual Activity   Alcohol use: No   Drug use: No   Sexual activity: Not on file  Other Topics Concern   Not on file  Social History Narrative   Not on file   Social Determinants of Health   Financial Resource Strain: Low Risk  (11/21/2021)   Overall Financial Resource Strain (CARDIA)    Difficulty of Paying Living Expenses: Not hard at all  Food Insecurity: No Food Insecurity (11/19/2021)   Hunger Vital Sign    Worried About Running Out of Food in the Last Year: Never true    [place] in the Last Year: Never true  Transportation Needs: No Transportation Needs (11/21/2021)   PRAPARE - Hydrologist (Medical): No  Lack of Transportation (Non-Medical): No  Physical Activity: Inactive (07/31/2021)   Exercise Vital Sign    Days of Exercise per Week: 0 days    Minutes of Exercise per Session: 0 min  Stress: Unknown (07/31/2021)   [place]    Feeling of Stress : Patient refused  Social Connections: Unknown (07/31/2021)   Social Connection and Isolation Panel [NHANES]    Frequency of Communication with Friends and Family: Three times a week    Frequency of  Social Gatherings with Friends and Family: Three times a week    Attends Religious Services: Patient refused    Active Member of Clubs or Organizations: Patient refused    Attends Archivist Meetings: Patient refused    Marital Status: Married  Human resources officer Violence: Not At Risk (07/31/2021)   Humiliation, Afraid, Rape, and Kick questionnaire    Fear of Current or Ex-Partner: No    Emotionally Abused: No    Physically Abused: No    Sexually Abused: No      Review of Systems  All other systems reviewed and are negative.      Objective:   Physical Exam Vitals reviewed.  Constitutional:      General: He is not in acute distress.    Appearance: He is ill-appearing.  HENT:     Mouth/Throat:     Mouth: Mucous membranes are dry.  Cardiovascular:     Rate and Rhythm: Normal rate and regular rhythm.     Heart sounds: Normal heart sounds. No murmur heard.    No friction rub. No gallop.  Pulmonary:     Effort: Pulmonary effort is normal. No respiratory distress.     Breath sounds: Normal breath sounds. No stridor. No wheezing, rhonchi or rales.  Neurological:     General: No focal deficit present.     Mental Status: He is alert and oriented to person, place, and time.     Cranial Nerves: No cranial nerve deficit.     Sensory: No sensory deficit.     Motor: Weakness present.     Coordination: Coordination abnormal.     Gait: Gait abnormal.     Deep Tendon Reflexes: Reflexes normal.           Assessment & Plan:  No diagnosis found. We discussed whether the patient needed to go back to the hospital.  At the present time there is no evidence of any new acute illness.  There is no evidence of any pneumonia or sepsis.  He is not dehydrated.  He is able to maintain his nutritional status orally without requiring IV.  Therefore he does not require hospitalization.  However he has severe ataxia and balance issues.  He needs significant assistance simply to go to the  bathroom and to ambulate.  There are no other family members available at home with the patient because his wife is hospitalized and his daughter is unable to provide the requisite care and work.  Therefore I recommended short-term nursing home placement.  We discussed facilities that the daughter can call and check for bed availability.  If they have bed availability I will be glad to fill out [place] 2 form.  I believe that this will help in caring for the patient recently gets his strength back in his balance back.  I do not feel that he has worsening of his intracerebral hemorrhage.  I believe the headache has been dull and constant ever since  his hospitalization.  Therefore we will treat the patient symptomatically with Percocet 7/325 1 p.o. every 6 hours as needed pain.  I recommended that they consistently give the patient 25 units of Lantus a day.  I would discontinue NovoLog until the patient is eating more.  Patient became nauseated and demanded to leave the office before we can address other issues.  He will need to continue his Eliquis for the 3 to 6 months.  He has a 75% stenosis in the left internal carotid artery I would like to set the patient up to see a vascular surgeon however I believe we need to monitor his treatment for his malignancy in his left upper lobe first and see the prognosis for this prior to pursuing any type of vascular surgery.  His blood pressure today is acceptable at 118/78.  I reviewed his sugars and his sugars have been between 120 and 240.  I believe that he is consistent with his insulin we can keep this between 102 100.  I want him to focus on maintaining his hydration and trying to gradually increase his diet.  I believe that he would benefit from short-term rehab placement until his strength improves.

## 2021-12-02 ENCOUNTER — Ambulatory Visit: Payer: PPO | Admitting: Radiation Oncology

## 2021-12-02 ENCOUNTER — Encounter: Payer: Self-pay | Admitting: Oncology

## 2021-12-02 ENCOUNTER — Inpatient Hospital Stay: Payer: PPO

## 2021-12-02 ENCOUNTER — Inpatient Hospital Stay: Payer: PPO | Attending: Radiation Oncology | Admitting: Oncology

## 2021-12-02 VITALS — BP 134/74 | HR 86 | Temp 97.0°F | Resp 16 | Ht 67.0 in | Wt 147.0 lb

## 2021-12-02 DIAGNOSIS — E876 Hypokalemia: Secondary | ICD-10-CM | POA: Diagnosis not present

## 2021-12-02 DIAGNOSIS — I700 Atherosclerosis of aorta: Secondary | ICD-10-CM | POA: Insufficient documentation

## 2021-12-02 DIAGNOSIS — Z7901 Long term (current) use of anticoagulants: Secondary | ICD-10-CM | POA: Diagnosis not present

## 2021-12-02 DIAGNOSIS — K219 Gastro-esophageal reflux disease without esophagitis: Secondary | ICD-10-CM | POA: Insufficient documentation

## 2021-12-02 DIAGNOSIS — I2699 Other pulmonary embolism without acute cor pulmonale: Secondary | ICD-10-CM

## 2021-12-02 DIAGNOSIS — Z87891 Personal history of nicotine dependence: Secondary | ICD-10-CM | POA: Insufficient documentation

## 2021-12-02 DIAGNOSIS — G893 Neoplasm related pain (acute) (chronic): Secondary | ICD-10-CM

## 2021-12-02 DIAGNOSIS — M549 Dorsalgia, unspecified: Secondary | ICD-10-CM | POA: Insufficient documentation

## 2021-12-02 DIAGNOSIS — E11319 Type 2 diabetes mellitus with unspecified diabetic retinopathy without macular edema: Secondary | ICD-10-CM | POA: Insufficient documentation

## 2021-12-02 DIAGNOSIS — N401 Enlarged prostate with lower urinary tract symptoms: Secondary | ICD-10-CM | POA: Insufficient documentation

## 2021-12-02 DIAGNOSIS — E785 Hyperlipidemia, unspecified: Secondary | ICD-10-CM | POA: Diagnosis not present

## 2021-12-02 DIAGNOSIS — C7931 Secondary malignant neoplasm of brain: Secondary | ICD-10-CM | POA: Insufficient documentation

## 2021-12-02 DIAGNOSIS — C7951 Secondary malignant neoplasm of bone: Secondary | ICD-10-CM

## 2021-12-02 DIAGNOSIS — D6481 Anemia due to antineoplastic chemotherapy: Secondary | ICD-10-CM | POA: Diagnosis not present

## 2021-12-02 DIAGNOSIS — C3412 Malignant neoplasm of upper lobe, left bronchus or lung: Secondary | ICD-10-CM

## 2021-12-02 DIAGNOSIS — Z85820 Personal history of malignant melanoma of skin: Secondary | ICD-10-CM | POA: Insufficient documentation

## 2021-12-02 DIAGNOSIS — D519 Vitamin B12 deficiency anemia, unspecified: Secondary | ICD-10-CM | POA: Insufficient documentation

## 2021-12-02 DIAGNOSIS — Z79899 Other long term (current) drug therapy: Secondary | ICD-10-CM | POA: Insufficient documentation

## 2021-12-02 DIAGNOSIS — Z7952 Long term (current) use of systemic steroids: Secondary | ICD-10-CM | POA: Diagnosis not present

## 2021-12-02 DIAGNOSIS — I619 Nontraumatic intracerebral hemorrhage, unspecified: Secondary | ICD-10-CM | POA: Diagnosis not present

## 2021-12-02 DIAGNOSIS — Z794 Long term (current) use of insulin: Secondary | ICD-10-CM | POA: Diagnosis not present

## 2021-12-02 DIAGNOSIS — D518 Other vitamin B12 deficiency anemias: Secondary | ICD-10-CM | POA: Diagnosis not present

## 2021-12-02 DIAGNOSIS — Z5112 Encounter for antineoplastic immunotherapy: Secondary | ICD-10-CM | POA: Diagnosis not present

## 2021-12-02 DIAGNOSIS — Z5111 Encounter for antineoplastic chemotherapy: Secondary | ICD-10-CM

## 2021-12-02 LAB — COMPREHENSIVE METABOLIC PANEL
ALT: 15 U/L (ref 0–44)
AST: 21 U/L (ref 15–41)
Albumin: 3.1 g/dL — ABNORMAL LOW (ref 3.5–5.0)
Alkaline Phosphatase: 90 U/L (ref 38–126)
Anion gap: 5 (ref 5–15)
BUN: 10 mg/dL (ref 8–23)
CO2: 27 mmol/L (ref 22–32)
Calcium: 8.6 mg/dL — ABNORMAL LOW (ref 8.9–10.3)
Chloride: 102 mmol/L (ref 98–111)
Creatinine, Ser: 0.76 mg/dL (ref 0.61–1.24)
GFR, Estimated: >60 mL/min (ref >60)
Glucose, Bld: 159 mg/dL — ABNORMAL HIGH (ref 70–99)
Potassium: 3.8 mmol/L (ref 3.5–5.1)
Sodium: 134 mmol/L — ABNORMAL LOW (ref 135–145)
Total Bilirubin: 0.4 mg/dL (ref 0.3–1.2)
Total Protein: 6.9 g/dL (ref 6.5–8.1)

## 2021-12-02 LAB — CBC WITH DIFFERENTIAL/PLATELET
Abs Immature Granulocytes: 0.04 10*3/uL (ref 0.00–0.07)
Basophils Absolute: 0.1 10*3/uL (ref 0.0–0.1)
Basophils Relative: 1 %
Eosinophils Absolute: 0.2 10*3/uL (ref 0.0–0.5)
Eosinophils Relative: 3 %
HCT: 32.1 % — ABNORMAL LOW (ref 39.0–52.0)
Hemoglobin: 10.4 g/dL — ABNORMAL LOW (ref 13.0–17.0)
Immature Granulocytes: 1 %
Lymphocytes Relative: 10 %
Lymphs Abs: 0.8 10*3/uL (ref 0.7–4.0)
MCH: 29.9 pg (ref 26.0–34.0)
MCHC: 32.4 g/dL (ref 30.0–36.0)
MCV: 92.2 fL (ref 80.0–100.0)
Monocytes Absolute: 0.8 10*3/uL (ref 0.1–1.0)
Monocytes Relative: 11 %
Neutro Abs: 5.6 10*3/uL (ref 1.7–7.7)
Neutrophils Relative %: 74 %
Platelets: 428 10*3/uL — ABNORMAL HIGH (ref 150–400)
RBC: 3.48 MIL/uL — ABNORMAL LOW (ref 4.22–5.81)
RDW: 17.4 % — ABNORMAL HIGH (ref 11.5–15.5)
WBC: 7.5 10*3/uL (ref 4.0–10.5)
nRBC: 0 % (ref 0.0–0.2)

## 2021-12-02 LAB — TSH: TSH: 1.783 u[IU]/mL (ref 0.350–4.500)

## 2021-12-02 MED ORDER — SODIUM CHLORIDE 0.9 % IV SOLN
Freq: Once | INTRAVENOUS | Status: AC
  Filled 2021-12-02: qty 250

## 2021-12-02 MED ORDER — SODIUM CHLORIDE 0.9 % IV SOLN
350.0000 mg/m2 | Freq: Once | INTRAVENOUS | Status: AC
  Administered 2021-12-02: 600 mg via INTRAVENOUS
  Filled 2021-12-02: qty 4

## 2021-12-02 MED ORDER — PROCHLORPERAZINE MALEATE 10 MG PO TABS
10.0000 mg | ORAL_TABLET | Freq: Once | ORAL | Status: AC
  Administered 2021-12-02: 10 mg via ORAL
  Filled 2021-12-02: qty 1

## 2021-12-02 MED ORDER — SODIUM CHLORIDE 0.9 % IV SOLN
200.0000 mg | Freq: Once | INTRAVENOUS | Status: AC
  Administered 2021-12-02: 200 mg via INTRAVENOUS
  Filled 2021-12-02: qty 200

## 2021-12-02 MED ORDER — CYANOCOBALAMIN 1000 MCG/ML IJ SOLN
1000.0000 ug | Freq: Once | INTRAMUSCULAR | Status: AC
  Administered 2021-12-02: 1000 ug via INTRAMUSCULAR
  Filled 2021-12-02: qty 1

## 2021-12-02 MED ORDER — HEPARIN SOD (PORK) LOCK FLUSH 100 UNIT/ML IV SOLN
500.0000 [IU] | Freq: Once | INTRAVENOUS | Status: AC | PRN
  Administered 2021-12-02: 500 [IU]
  Filled 2021-12-02: qty 5

## 2021-12-02 NOTE — Patient Instructions (Signed)
[facility]  Discharge Instructions: Thank you for choosing [facility] to provide your oncology and hematology care.  If you have a lab appointment with the Cancer Center, please go directly to the Cancer Center and check in at the registration area.  Wear comfortable clothing and clothing appropriate for easy access to any Portacath or PICC line.   We strive to give you quality time with your provider. You may need to reschedule your appointment if you arrive late (15 or more minutes).  Arriving late affects you and other patients whose appointments are after yours.  Also, if you miss three or more appointments without notifying the office, you may be dismissed from the clinic at the provider's discretion.      For prescription refill requests, have your pharmacy contact our office and allow 72 hours for refills to be completed.       To help prevent nausea and vomiting after your treatment, we encourage you to take your nausea medication as directed.  BELOW ARE SYMPTOMS THAT SHOULD BE REPORTED IMMEDIATELY: *FEVER GREATER THAN 100.4 F (38 C) OR HIGHER *CHILLS OR SWEATING *NAUSEA AND VOMITING THAT IS NOT CONTROLLED WITH YOUR NAUSEA MEDICATION *UNUSUAL SHORTNESS OF BREATH *UNUSUAL BRUISING OR BLEEDING *URINARY PROBLEMS (pain or burning when urinating, or frequent urination) *BOWEL PROBLEMS (unusual diarrhea, constipation, pain near the anus) TENDERNESS IN MOUTH AND THROAT WITH OR WITHOUT PRESENCE OF ULCERS (sore throat, sores in mouth, or a toothache) UNUSUAL RASH, SWELLING OR PAIN  UNUSUAL VAGINAL DISCHARGE OR ITCHING   Items with * indicate a potential emergency and should be followed up as soon as possible or go to the Emergency Department if any problems should occur.  Please show the CHEMOTHERAPY ALERT CARD or IMMUNOTHERAPY ALERT CARD at check-in to the Emergency Department and triage nurse.  Should you have questions after your  visit or need to cancel or reschedule your appointment, please contact [facility]  [phone] and follow the prompts.  Office hours are 8:00 a.m. to 4:30 p.m. Monday - Friday. Please note that voicemails left after 4:00 p.m. may not be returned until the following business day.  We are closed weekends and major holidays. You have access to a nurse at all times for urgent questions. Please call the main number to the clinic [phone] and follow the prompts.  For any non-urgent questions, you may also contact your provider using MyChart. We now offer e-Visits for anyone 18 and older to request care online for non-urgent symptoms. For details visit [email or web address].   Also download the MyChart app! Go to the app store, search "MyChart", open the app, select [facility], and log in with your MyChart username and password.  Masks are optional in the cancer centers. If you would like for your care team to wear a mask while they are taking care of you, please let them know. For doctor visits, patients may have with them one support person who is at least 71 years old. At this time, visitors are not allowed in the infusion area.   

## 2021-12-02 NOTE — Assessment & Plan Note (Signed)
Chemotherapy plan as listed above 

## 2021-12-02 NOTE — Progress Notes (Signed)
Low Back pain 3/10 today with episodes of worse pain depending on activity.    Feeling more weak since hospital discharge.

## 2021-12-02 NOTE — Assessment & Plan Note (Addendum)
Back pain, MRI lumbar Continue PRN percocet Recommend patient to avoid heavy liftly.

## 2021-12-02 NOTE — Assessment & Plan Note (Signed)
Eliquis is decreased to 2.5mg  BID.  Repeat MRI brain

## 2021-12-02 NOTE — Progress Notes (Signed)
Hematology/Oncology Progress note [phone] [phone]            Patient Care Team: [name], MD as PCP - General (Family Medicine) [name], RN as Oncology Nurse Navigator   ASSESSMENT & PLAN:   Cancer Staging  Primary non-small cell carcinoma of upper lobe of left lung [facility]) Staging form: Lung, AJCC 8th Edition - Clinical: Stage IV (cT2b, cN1, cM1) - Signed by [name], MD on 08/06/2021   Primary non-small cell carcinoma of upper lobe of left lung ([place]) Stage IV lung adenocarcinoma with brain and bone metastasis.  PET scan images were reviewed with patient.- good partial response.  Labs are reviewed and discussed with patient. Proceed with maintenance Alimta/Keytruda today. -dose reduce Alimta 328m/m2  #Hypokalemia, recommend potassium supplementation 149m daily.  Encounter for antineoplastic chemotherapy Chemotherapy plan as listed above  Metastasis to bone ([place]Recommend Zometa every 4- 6 weeks. Recommend calcium supplementation.   Pulmonary embolus (HCC) Continue Eliquis 2.17m63mID.   B12 deficiency anemia continue  B12 injection monthly  Neoplasm related pain Back pain, MRI lumbar Continue PRN percocet Recommend patient to avoid heavy liftly.   Intraparenchymal hemorrhage of brain (HCC) Eliquis is decreased to 2.17mg81mD.  Repeat MRI brain   Orders Placed This Encounter  Procedures   CBC with Differential    Standing Status:   Future    Standing Expiration Date:   02/04/2023   Comprehensive metabolic panel    Standing Status:   Future    Standing Expiration Date:   02/04/2023   T4    Standing Status:   Future    Standing Expiration Date:   02/04/2023   TSH    Standing Status:   Future    Standing Expiration Date:   02/04/2023   Follow up Lab MD Alimta keytBeryle Flockweeks  All questions were answered. The patient knows to call the clinic with any problems, questions or concerns.  ZhouEarlie Server, [name] [facility]  Hematology Oncology 12/02/2021      CHIEF COMPLAINTS/REASON FOR VISIT:  non-small cell lung cancer  HISTORY OF PRESENTING ILLNESS:   Micheal Donovan  71 y78.  male presents for follow up of Non-small cell lung cancer.  Oncology History Overview Note  Diagnosis: Stage IIB T2b N1 M0 adenocarcinoma of the LUL, poorly differentiated     Primary non-small cell carcinoma of upper lobe of left lung (HCC)[place]/13/2023 Initial Diagnosis   [facility]-small [facility] - [facility]2021, [facility] progressive headache/dizziness/gait changes. CT head showed acute to subacute intraparenchymal hemorrhage involving the left cerebellum.  Surrounding low-density vasogenic edema.  [facility] evaluated by CT angiogram of the neck which showed bulky calcified plaques/stenosis of carotid artery, Followed by MRI brain. 06/12/2021, MRI of the brain showed no significant interval change in size of the left cerebellar intraparenchymal hematoma with unchanged regional mass effect and partial effacement of fourth ventricle but no upstream hydrocephalus. There is no discernible enhancement to suggest underlying mass lesion, though acute blood products could mask enhancement.   06/12/2021 a chest x-ray showed a 4.4 cm left middle lobe. 06/13/2021, CT chest with contrast showed a 4.3 x 3.6 x 3.3 cm lobular spiculated mass in the posterior left upper lobe with T3 to the lateral pleura and major fissure.  Metastatic left hilar lymphadenopathy.  Peripheral micronodularity posterior right costophrenic  sulcus.  Aortic atherosclerosis. 06/14/2021, CT abdomen pelvis showed right lower lobe pulmonary artery embolus.  No evidence of right heart strain.  No acute intra-abdominal or pelvic pathology.  Aortic atherosclerosis.  06/13/2021, patient underwent bronchoscopy with EBUS by Dr. Tamala Julian.  Biopsy from the  fine-needle aspiration of station 11 mL showed malignant cells, consistent with poorly differentiated non-small cell carcinoma, consistent with adenocarcinoma.  Malignant cells are TTF-1 positive and negative for p40.  Negative for neuroendocrine markers.  Blood test and tissue sample were tested for Gardant 360  -PD-L1 TPS 97%, no actionable mutation on the blood testing. 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   07/17/2021 Cancer Staging   Staging form: Lung, AJCC 8th Edition - Clinical: Stage IV (cT2b, cN1, cM1) - Signed by [name], MD on 08/06/2021   08/06/2021 Imaging   I saw patient's PET scan after his visit with me on 08/06/21. PET scan was ordered by his previous oncologist group and did not come to my in basket.. Patient received cycle 1 carboplatin Taxol today.  He has started on radiation.  5/26/3 PET scan showed 3.8 cm hypermetabolic left upper lobe mass with hypermetabolic left hilar and infrahilar adenopathy.  There is approximately 8 scattered metastatic lesions in the skeleton. Mixed density photopenic lesion in the left cerebellum. characterized as hemorrhage on recent prior imaging workups.    08/06/2021 - 08/06/2021 Chemotherapy   Patient is on Treatment Plan : LUNG Carboplatin / Paclitaxel + XRT q7d     08/16/2021 -  Chemotherapy   Switched to systemic chemotherapy with  Carboplatin (4.5) + Pemetrexed (400) + Pembrolizumab (200) D1 q21d Induction x 4 cycles      08/16/2021 -  Chemotherapy   Patient is on Treatment Plan : LUNG Carboplatin (5) + Pemetrexed (500) + Pembrolizumab (200) D1 q21d Induction x 4 cycles / Maintenance Pemetrexed (500) + Pembrolizumab (200) D1 q21d     08/22/2021 Imaging   MRI brain w wo contrast  Decrease in size of left cerebellar hematoma with resolution of edema. No evidence of underlying lesion. Smaller, more recent hemorrhage in the left cerebellar vermis with minimal edema. There is minimal enhancement without definite evidence of  underlying lesion.  New punctate focus of chronic blood products and enhancement in the left frontal lobe. Additional new foci of chronic blood products in the posterior right putamen, right parietal subcortical white matter, and posterior right cerebellum. Unclear at this time if these represent foci of bland hemorrhage or early metastases.   Increase in size of right parietal osseous metastasis with minor extraosseous extension.    10/22/2021 - 10/22/2021 Chemotherapy   Patient is on Treatment Plan : LUNG Carboplatin (5) + Pemetrexed (500) + Pembrolizumab (200) D1 q21d Induction x 4 cycles / Maintenance Pemetrexed (500) + Pembrolizumab (200) D1 q21d      Imaging   PET scan showed 1. Interval response to therapy as evidenced by a small residual left upper lobe nodule with decreased hypermetabolism, no residual hypermetabolic adenopathy and decreased hypermetabolism associatedwith osseous metastases. 2. 6 mm posterior left upper lobe nodule, likely stable. Recommend attention on follow-up. 3. Aortic atherosclerosis (ICD10-I70.0). Coronary artery calcification.   # Patient has a history of melanoma on his neck, treated in 2009. # History of hemorrhagic infarct left cerebellum  INTERVAL HISTORY Micheal Donovan is a 71 y.o. male who has above history reviewed by me today presents for follow up visit for management of  Stage IV lung adenocarcianoma Patient has gained weight.  Overall tolerates chemotherapy. # Pulmonary embolism, SOB is stable, not worse.  + increased fatigue, he reports not able to move a carton of bottled water.  + back pain   Review of Systems  Constitutional:  Positive for fatigue. Negative for appetite change, chills, fever and unexpected weight change.  HENT:   Negative for hearing loss and voice change.   Eyes:  Negative for eye problems and icterus.  Respiratory:  Positive for shortness of breath. Negative for chest tightness and cough.   Cardiovascular:  Negative for  chest pain and leg swelling.  Gastrointestinal:  Negative for abdominal distention, abdominal pain and blood in stool.  Endocrine: Negative for hot flashes.  Genitourinary:  Negative for difficulty urinating, dysuria and frequency.   Musculoskeletal:  Negative for arthralgias.  Skin:  Negative for itching and rash.  Neurological:  Negative for extremity weakness, headaches, light-headedness and numbness.  Hematological:  Negative for adenopathy. Does not bruise/bleed easily.  Psychiatric/Behavioral:  Negative for confusion.     MEDICAL HISTORY:  Past Medical History:  Diagnosis Date   Allergy    BPH (benign prostatic hypertrophy)    Cancer (HCC)    Melanoma on Neck    2008   Carotid artery occlusion    Diabetes mellitus    type 2   ED (erectile dysfunction)    GERD (gastroesophageal reflux disease)    Hemorrhagic stroke ([place]) 06/2021   Hyperlipidemia    Hypertension    Lung cancer ([place])    Retinopathy due to secondary DM ([place])     SURGICAL HISTORY: Past Surgical History:  Procedure Laterality Date   BRONCHIAL NEEDLE ASPIRATION BIOPSY  06/17/2021   Procedure: BRONCHIAL NEEDLE ASPIRATION BIOPSIES;  Surgeon: Candee Furbish, MD;  Location: [facility] ENDOSCOPY;  Service: Pulmonary;;   IR IMAGING GUIDED PORT INSERTION  08/05/2021   MELANOMA EXCISION  2008   Left side of neck   RADIOLOGY WITH ANESTHESIA N/A 04/05/2020   Procedure: MRI SPINE WITOUT CONTRAST;  Surgeon: Radiologist, Medication, MD;  Location: [place];  Service: Radiology;  Laterality: N/A;   RADIOLOGY WITH ANESTHESIA N/A 08/22/2021   Procedure: MRI BRAIN WITH AND WITHOUT CONTRAST  WITH ANESTHESIA;  Surgeon: Radiologist, Medication, MD;  Location: [place];  Service: Radiology;  Laterality: N/A;   TONSILLECTOMY     VIDEO BRONCHOSCOPY WITH ENDOBRONCHIAL ULTRASOUND N/A 06/17/2021   Procedure: VIDEO BRONCHOSCOPY WITH ENDOBRONCHIAL ULTRASOUND;  Surgeon: Candee Furbish, MD;  Location: [facility] ENDOSCOPY;  Service: Pulmonary;  Laterality: N/A;     SOCIAL HISTORY: Social History   Socioeconomic History   Marital status: Married    Spouse name: Not on file   Number of children: Not on file   Years of education: Not on file   Highest education level: Not on file  Occupational History   Not on file  Tobacco Use   Smoking status: Former    Types: Cigarettes    Quit date: 07/21/1990    Years since quitting: 31.3   Smokeless tobacco: Never  Vaping Use   Vaping Use: Never used  Substance and Sexual Activity   Alcohol use: No   Drug use: No   Sexual activity: Not on file  Other Topics Concern   Not on file  Social History Narrative   Not on file   Social Determinants of Health   Financial Resource Strain: Low Risk  (11/21/2021)   Overall Financial Resource Strain (CARDIA)    Difficulty of Paying Living Expenses: Not hard at all  Food Insecurity:  No Food Insecurity (11/19/2021)   Hunger Vital Sign    Worried About Running Out of Food in the Last Year: Never true    Ran Out of Food in the Last Year: Never true  Transportation Needs: No Transportation Needs (11/21/2021)   PRAPARE - Hydrologist (Medical): No    Lack of Transportation (Non-Medical): No  Physical Activity: Inactive (07/31/2021)   Exercise Vital Sign    Days of Exercise per Week: 0 days    Minutes of Exercise per Session: 0 min  Stress: Unknown (07/31/2021)   [place]    Feeling of Stress : Patient refused  Social Connections: Unknown (07/31/2021)   Social Connection and Isolation Panel [NHANES]    Frequency of Communication with Friends and Family: Three times a week    Frequency of Social Gatherings with Friends and Family: Three times a week    Attends Religious Services: Patient refused    Active Member of Clubs or Organizations: Patient refused    Attends Archivist Meetings: Patient refused    Marital Status: Married  Human resources officer  Violence: Not At Risk (07/31/2021)   Humiliation, Afraid, Rape, and Kick questionnaire    Fear of Current or Ex-Partner: No    Emotionally Abused: No    Physically Abused: No    Sexually Abused: No    FAMILY HISTORY: Family History  Problem Relation Age of Onset   COPD Mother    Heart disease Father    Heart disease Brother        MI at age 27   Stroke Neg Hx     ALLERGIES:  is allergic to niaspan [niacin].  MEDICATIONS:  Current Outpatient Medications  Medication Sig Dispense Refill   apixaban (ELIQUIS) 2.5 MG TABS tablet Take 1 tablet (2.5 mg total) by mouth 2 (two) times daily. 60 tablet 3   atorvastatin (LIPITOR) 40 MG tablet TAKE 1 TABLET(40 MG) BY MOUTH DAILY 90 tablet 2   calcium-vitamin D (OSCAL WITH D) 500-5 MG-MCG tablet Take 2 tablets by mouth daily. 60 tablet 3   dexamethasone (DECADRON) 4 MG tablet Take 1 tab every 12 hours the day before pemetrexed chemo, then take 2 tabs once a day for 3 days starting the day after carboplatin. 30 tablet 1   folic acid (FOLVITE) 1 MG tablet Take 1 tablet (1 mg total) by mouth daily. Start 7 days before pemetrexed chemotherapy. Continue until 21 days after pemetrexed completed. 100 tablet 3   insulin glargine (LANTUS) 100 UNIT/ML Solostar Pen Inject 25 Units into the skin daily. (Patient taking differently: Inject 15-30 Units into the skin daily as needed (high blood sugar).) 15 mL 11   omeprazole (PRILOSEC) 20 MG capsule Take 20 mg by mouth daily.     oxyCODONE-acetaminophen (PERCOCET) 7.5-325 MG tablet Take 1 tablet by mouth every 4 (four) hours as needed for severe pain. 60 tablet 0   potassium chloride (KLOR-CON M) 10 MEQ tablet TAKE 1 TABLET(10 MEQ) BY MOUTH DAILY 90 tablet 0   tamsulosin (FLOMAX) 0.4 MG CAPS capsule Take 1 capsule (0.4 mg total) by mouth daily. 90 capsule 0   UNABLE TO FIND PLEASE CHECK FASTING BLOOD GLUCOSE EVERY MORNING 1 each 0   collagenase (SANTYL) 250 UNIT/GM ointment Apply 1 Application topically daily.  (Patient not taking: Reported on 12/02/2021) 15 g 0   LORazepam (ATIVAN) 0.5 MG tablet Take 1 tablet (0.5 mg total) by mouth See  admin instructions. Take 1 tablet 30 minutes prior to imaging procedure, may take another tablet if needed.Avoid driving after taking medication. (Patient not taking: Reported on 12/02/2021) 2 tablet 0   losartan (COZAAR) 50 MG tablet TAKE 1 TABLET(50 MG) BY MOUTH DAILY (Patient not taking: Reported on 11/19/2021) 90 tablet 0   NOVOLOG FLEXPEN 100 UNIT/ML FlexPen Inject into the skin. (Patient not taking: Reported on 11/19/2021)     pantoprazole (PROTONIX) 40 MG tablet Take 1 tablet (40 mg total) by mouth daily at 6 (six) AM. (Patient not taking: Reported on 12/02/2021) 90 tablet 0   No current facility-administered medications for this visit.     PHYSICAL EXAMINATION:  Vitals:   12/02/21 1300  BP: 134/74  Pulse: 86  Resp: 16  Temp: (!) 97 F (36.1 C)   Filed Weights   12/02/21 1300  Weight: 147 lb (66.7 kg)    Physical Exam Constitutional:      General: He is not in acute distress.    Comments: Patient sits in the wheelchair  HENT:     Head: Normocephalic and atraumatic.  Eyes:     General: No scleral icterus. Cardiovascular:     Rate and Rhythm: Normal rate and regular rhythm.     Heart sounds: Normal heart sounds.  Pulmonary:     Effort: Pulmonary effort is normal. No respiratory distress.     Breath sounds: No wheezing.     Comments: Decreased breath sound bilaterally. Abdominal:     General: Bowel sounds are normal. There is no distension.     Palpations: Abdomen is soft.  Musculoskeletal:        General: No deformity. Normal range of motion.     Cervical back: Normal range of motion and neck supple.  Skin:    General: Skin is warm and dry.     Findings: No erythema or rash.  Neurological:     Mental Status: He is alert and oriented to person, place, and time. Mental status is at baseline.     Cranial Nerves: No cranial nerve deficit.      Coordination: Coordination normal.  Psychiatric:        Mood and Affect: Mood normal.     LABORATORY DATA:  I have reviewed the data as listed    Latest Ref Rng & Units 12/02/2021    1:05 PM 11/20/2021    4:30 AM 11/19/2021    5:42 PM  CBC  WBC 4.0 - 10.5 K/uL 7.5  2.1    Hemoglobin 13.0 - 17.0 g/dL 10.4  7.7  8.5   Hematocrit 39.0 - 52.0 % 32.1  23.6  25.8   Platelets 150 - 400 K/uL 428  173        Latest Ref Rng & Units 12/02/2021    1:05 PM 11/20/2021    4:30 AM 11/19/2021    5:27 AM  CMP  Glucose 70 - 99 mg/dL 159  130  123   BUN 8 - 23 mg/dL _0 Creatinine 0.61 - 1.24 mg/dL 0.76  0.70  0.72   Sodium 135 - 145 mmol/L 134  137  137   Potassium 3.5 - 5.1 mmol/L 3.8  3.7  3.4   Chloride 98 - 111 mmol/L 102  109  105   CO2 22 - 32 mmol/L _1 Calcium 8.9 - 10.3 mg/dL 8.6  8.0  7.6   Total Protein 6.5 - 8.1 g/dL 6.9  5.6    Total Bilirubin 0.3 - 1.2 mg/dL 0.4  0.3    Alkaline Phos 38 - 126 U/L 90  59    AST 15 - 41 U/L 21  15    ALT 0 - 44 U/L 15  14       Iron/TIBC/Ferritin/ %Sat    Component Value Date/Time   IRON 21 (L) 11/19/2021 0822   TIBC 246 (L) 11/19/2021 0822   FERRITIN 895 (H) 11/19/2021 0822   IRONPCTSAT 9 (L) 11/19/2021 8088      RADIOGRAPHIC STUDIES: I have personally reviewed the radiological images as listed and agreed with the findings in the report. CT ABDOMEN PELVIS W CONTRAST  Result Date: 11/18/2021 CLINICAL DATA:  Acute abdominal pain. Weakness. Fevers. Patient with history of lung cancer and osseous metastasis. EXAM: CT ABDOMEN AND PELVIS WITH CONTRAST TECHNIQUE: Multidetector CT imaging of the abdomen and pelvis was performed using the standard protocol following bolus administration of intravenous contrast. RADIATION DOSE REDUCTION: This exam was performed according to the departmental dose-optimization program which includes automated exposure control, adjustment of the mA and/or kV according to patient size and/or use of  iterative reconstruction technique. CONTRAST:  150m OMNIPAQUE IOHEXOL 300 MG/ML  SOLN COMPARISON:  PET CT 10/29/2021, abdominopelvic CT 06/18/2021 FINDINGS: Lower chest: No basilar consolidation or pleural effusion. The previous basilar pulmonary embolus is not seen on the current exam. Hepatobiliary: Focal fatty infiltration adjacent to the falciform ligament. No suspicious liver lesion Gallbladder physiologically distended, no calcified stone. No biliary dilatation. Pancreas: No ductal dilatation or inflammation.  Mild fatty atrophy. Spleen: Normal in size without focal abnormality. Adrenals/Urinary Tract: Normal adrenal glands. No hydronephrosis or perinephric edema. Homogeneous renal enhancement with symmetric excretion on delayed phase imaging. Cortical scarring in the lateral mid left kidney. Left renal cyst is well as bilateral low-density renal lesions are too small to accurately characterize. No specific imaging follow-up is recommended. Urinary bladder is physiologically distended without wall thickening. Stomach/Bowel: Wall thickening with perienteric edema and enhancement involving a moderate length segment of distal ileum, terminating just before the terminal ileum. Just proximal to this inflamed small bowel segments is fecalization of small bowel contents. More proximal small bowel mildly prominent and fluid-filled. There is no bowel pneumatosis. There is no associated colonic inflammation. The appendix tentatively visualized and uninflamed. Unremarkable appearance of the stomach. No colonic inflammation. Vascular/Lymphatic: Moderate to advanced aortic and branch atherosclerosis. No aortic aneurysm. The portal and superior mesenteric veins are patent. There is perivascular edema about the small bowel mesenteric vessels but no embolic disease. No bulky adenopathy. Reproductive: Prostate is unremarkable. Other: Inflammatory change in the right lower quadrant related to small bowel inflammation. Fat  stranding and a small amount of non organized free fluid. No focal fluid collection or free air. Minimal fat in the inguinal canals. Musculoskeletal: Mild L2 superior endplate compression deformity appears similar to recent PET, hypermetabolic lesion on 011/03/1594PET. There is a large Schmorl's node involving the superior endplate of L5, likely pathologic given prior hypermetabolic lesion on 058/59/2924PET. Patient's known osseous metastatic disease is better appreciated on recent PET. Cortical thinning with ill-defined lytic lesion involving the right anterior iliac bone, as seen on prior PET. Again seen prior left lateral lower rib fracture. IMPRESSION: 1. Moderate length segment of inflamed distal ileum, terminating just before the terminal ileum, consistent with enteritis. This may be infectious or inflammatory. No perforation or abscess. 2. Fecalization of small bowel contents just proximal to the inflamed small bowel segment  likely related to delayed transit time. No obstruction. 3. Patient's known osseous metastatic disease is better appreciated on recent PET. Aortic Atherosclerosis (ICD10-I70.0). Electronically Signed   By: Keith Rake M.D.   On: 11/18/2021 20:44   DG Chest 2 View  Result Date: 11/18/2021 CLINICAL DATA:  Weakness.  History of lung cancer. EXAM: CHEST - 2 VIEW COMPARISON:  June 12, 2021. FINDINGS: The heart size and mediastinal contours are within normal limits. Right internal jugular Port-A-Cath is noted with distal tip in expected position of SVC. Right lung is clear. Irregular nodular density is noted in left midlung which is significantly smaller compared to prior exam consistent with lung mass noted on prior exam. The visualized skeletal structures are unremarkable. IMPRESSION: Left midlung mass noted on prior exam is smaller currently. Status post right internal jugular Port-A-Cath placement. No other abnormality is noted. Electronically Signed   By: Marijo Conception M.D.    On: 11/18/2021 16:47

## 2021-12-02 NOTE — Assessment & Plan Note (Signed)
Recommend Zometa every 4- 6 weeks. Recommend calcium supplementation.

## 2021-12-02 NOTE — Assessment & Plan Note (Addendum)
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  PET scan images were reviewed with patient.- good partial response.  Labs are reviewed and discussed with patient. Proceed with maintenance Alimta/Keytruda today. -dose reduce Alimta 350mg /m2  #Hypokalemia, recommend potassium supplementation 41meq daily.

## 2021-12-02 NOTE — Assessment & Plan Note (Signed)
Continue Eliquis 2.5mg  BID.

## 2021-12-02 NOTE — Assessment & Plan Note (Signed)
continue  B12 injection monthly

## 2021-12-03 ENCOUNTER — Encounter: Payer: Self-pay | Admitting: Oncology

## 2021-12-03 LAB — T4: T4, Total: 8.1 ug/dL (ref 4.5–12.0)

## 2021-12-03 NOTE — Progress Notes (Signed)
After patient left, RN realized that she missed the Zometa that was supposed to have been given. RN called the patient and explained the situation and apologized for the mistake. Patient was offered to return to clinic this week for the Zometa. Patient stated that he was very busy and didn't know what he was doing from day to day, so he would have to call back if he is in the area to schedule appointment.

## 2021-12-05 ENCOUNTER — Encounter: Payer: Self-pay | Admitting: *Deleted

## 2021-12-05 ENCOUNTER — Ambulatory Visit: Payer: PPO | Admitting: Adult Health

## 2021-12-09 ENCOUNTER — Other Ambulatory Visit: Payer: Self-pay | Admitting: Oncology

## 2021-12-10 ENCOUNTER — Other Ambulatory Visit: Payer: Self-pay | Admitting: Family Medicine

## 2021-12-10 ENCOUNTER — Ambulatory Visit (INDEPENDENT_AMBULATORY_CARE_PROVIDER_SITE_OTHER): Payer: PPO | Admitting: Family Medicine

## 2021-12-10 VITALS — BP 122/70 | HR 99 | Wt 142.0 lb

## 2021-12-10 DIAGNOSIS — I619 Nontraumatic intracerebral hemorrhage, unspecified: Secondary | ICD-10-CM | POA: Diagnosis not present

## 2021-12-10 DIAGNOSIS — E11649 Type 2 diabetes mellitus with hypoglycemia without coma: Secondary | ICD-10-CM

## 2021-12-10 DIAGNOSIS — C3412 Malignant neoplasm of upper lobe, left bronchus or lung: Secondary | ICD-10-CM

## 2021-12-10 MED ORDER — LORAZEPAM 1 MG PO TABS: 1.0000 mg | ORAL_TABLET | Freq: Every evening | ORAL | 0 refills | Status: DC | PRN

## 2021-12-10 NOTE — Telephone Encounter (Signed)
Requested Prescriptions  Pending Prescriptions Disp Refills  . amLODipine (NORVASC) 10 MG tablet [Pharmacy Med Name: AMLODIPINE BESYLATE 10MG  TABLETS] 90 tablet 0    Sig: TAKE 1 TABLET(10 MG) BY MOUTH DAILY     Cardiovascular: Calcium Channel Blockers 2 Passed - 12/10/2021  3:42 AM      Passed - Last BP in normal range    BP Readings from Last 1 Encounters:  12/10/21 122/70         Passed - Last Heart Rate in normal range    Pulse Readings from Last 1 Encounters:  12/10/21 99         Passed - Valid encounter within last 6 months    Recent Outpatient Visits          5 months ago Uncontrolled type 2 diabetes mellitus with hypoglycemia, unspecified hypoglycemia coma status ([place])   [place] Pickard, Cammie Mcgee, MD   6 months ago Uncontrolled type 2 diabetes mellitus with hypoglycemia, unspecified hypoglycemia coma status ([place])   [place] Pickard, Cammie Mcgee, MD   3 years ago Pruritus   [place] Schaumann, Cammie Mcgee, MD   4 years ago Encounter for hepatitis C screening test for low risk patient   [place] [name], MD   4 years ago Type 2 diabetes mellitus without complication, with long-term current use of insulin ([place])   [place], Modena Nunnery, MD             . losartan (COZAAR) 50 MG tablet [Pharmacy Med Name: LOSARTAN 50MG  TABLETS] 90 tablet 0    Sig: TAKE 1 TABLET(50 MG) BY MOUTH DAILY     Cardiovascular:  Angiotensin Receptor Blockers Passed - 12/10/2021  3:42 AM      Passed - Cr in normal range and within 180 days    Creatinine  Date Value Ref Range Status  07/17/2021 0.76 0.61 - 1.24 mg/dL Final   Creat  Date Value Ref Range Status  06/10/2021 0.89 0.70 - 1.28 mg/dL Final   Creatinine, Ser  Date Value Ref Range Status  12/02/2021 0.76 0.61 - 1.24 mg/dL Final   Creatinine, Urine  Date Value Ref Range Status  03/06/2016 162 20 - 370 mg/dL Final          Passed - K in normal range and within 180 days    Potassium  Date Value Ref Range Status  12/02/2021 3.8 3.5 - 5.1 mmol/L Final         Passed - Patient is not pregnant      Passed - Last BP in normal range    BP Readings from Last 1 Encounters:  12/10/21 122/70         Passed - Valid encounter within last 6 months    Recent Outpatient Visits          5 months ago Uncontrolled type 2 diabetes mellitus with hypoglycemia, unspecified hypoglycemia coma status ([place])   [place] [name], MD   6 months ago Uncontrolled type 2 diabetes mellitus with hypoglycemia, unspecified hypoglycemia coma status ([place])   [place] Pickard, Cammie Mcgee, MD   3 years ago Pruritus   [place] Denn[place] Schaumann, Cammie Mcgee, MD   4 years ago Encounter for hepatitis C screening test for low risk patient   [facility] [name], MD   4 years ago Type 2 diabetes  mellitus without complication, with long-term current use of insulin ([place])   [facility] [place], Modena Nunnery, MD

## 2021-12-10 NOTE — Telephone Encounter (Signed)
Requested Prescriptions  Pending Prescriptions Disp Refills  . amLODipine (NORVASC) 10 MG tablet [Pharmacy Med Name: AMLODIPINE BESYLATE 10MG  TABLETS] 90 tablet 0    Sig: TAKE 1 TABLET(10 MG) BY MOUTH DAILY     Cardiovascular: Calcium Channel Blockers 2 Passed - 12/10/2021  3:42 AM      Passed - Last BP in normal range    BP Readings from Last 1 Encounters:  12/10/21 122/70         Passed - Last Heart Rate in normal range    Pulse Readings from Last 1 Encounters:  12/10/21 99         Passed - Valid encounter within last 6 months    Recent Outpatient Visits          5 months ago Uncontrolled type 2 diabetes mellitus with hypoglycemia, unspecified hypoglycemia coma status ([place])   [place] Pickard, Cammie Mcgee, MD   6 months ago Uncontrolled type 2 diabetes mellitus with hypoglycemia, unspecified hypoglycemia coma status ([place])   [place] Pickard, Cammie Mcgee, MD   3 years ago Pruritus   [place]enn[place] Schaumann, Cammie Mcgee, MD   4 years ago Encounter for hepatitis C screening test for low risk patient   [place] [name], MD   4 years ago Type 2 diabetes mellitus without complication, with long-term current use of insulin ([place])   [place], Modena Nunnery, MD             Signed Prescriptions Disp Refills   losartan (COZAAR) 50 MG tablet 30 tablet 0    Sig: TAKE 1 TABLET(50 MG) BY MOUTH DAILY     Cardiovascular:  Angiotensin Receptor Blockers Passed - 12/10/2021  3:42 AM      Passed - Cr in normal range and within 180 days    Creatinine  Date Value Ref Range Status  07/17/2021 0.76 0.61 - 1.24 mg/dL Final   Creat  Date Value Ref Range Status  06/10/2021 0.89 0.70 - 1.28 mg/dL Final   Creatinine, Ser  Date Value Ref Range Status  12/02/2021 0.76 0.61 - 1.24 mg/dL Final   Creatinine, Urine  Date Value Ref Range Status  03/06/2016 162 20 - 370 mg/dL Final         Passed -  K in normal range and within 180 days    Potassium  Date Value Ref Range Status  12/02/2021 3.8 3.5 - 5.1 mmol/L Final         Passed - Patient is not pregnant      Passed - Last BP in normal range    BP Readings from Last 1 Encounters:  12/10/21 122/70         Passed - Valid encounter within last 6 months    Recent Outpatient Visits          5 months ago Uncontrolled type 2 diabetes mellitus with hypoglycemia, unspecified hypoglycemia coma status ([place])   [place] [name], MD   6 months ago Uncontrolled type 2 diabetes mellitus with hypoglycemia, unspecified hypoglycemia coma status ([place])   [place] Pickard, Cammie Mcgee, MD   3 years ago Pruritus   [place] Dennard Schaumann, Cammie Mcgee, MD   4 years ago Encounter for hepatitis C screening test for low risk patient   [facility] [name], MD   4 years ago Type 2 diabetes mellitus  without complication, with long-term current use of insulin ([place])   [facility] [place], Mod[place] Nunnery, MD

## 2021-12-10 NOTE — Progress Notes (Signed)
Subjective:    Patient ID: Micheal Donovan, male    DOB: 10-Jun-1950, 71 y.o.   MRN: [id number]  HPI Admit date:     11/18/2021  Discharge date: 11/20/21  Discharge Physician: Sharen Hones    PCP: [name], MD    Recommendations at discharge:    Follow-up with PCP in 1 week. Follow-up with oncology as scheduled.   Discharge Diagnoses: Principal Problem:   Neutropenic fever ([place]) Active Problems:   Primary non-small cell carcinoma of upper lobe of left lung (HCC)   Lower abdominal pain   Type 2 diabetes mellitus without complications (HCC)   Pulmonary embolus (HCC)   Essential hypertension   Dyslipidemia   Symptomatic anemia   Enteritis   Rhinovirus infection   Hypophosphatemia   Hospital Course:  71 y.o. Caucasian male with medical history significant for primary non-small cell lung cancer of the left upper lobe, PE, type 2 diabetes mellitus, essential hypertension, lumbar radiculopathy, history of melanoma, BPH, dyslipidemia and diabetic retinopathy, presented to the ER with acute onset of generalized weakness and fever at home with a temperature of 100.8. Patient is diagnosed with neutropenic fever, was placed on broad-spectrum antibiotics including vancomycin, cefepime and Flagyl. Condition so far had improved, neutrophil had increased to more than 1000, antibiotics changed to oral Flagyl and Cipro.     Assessment and Plan:  Primary non-small cell carcinoma of upper lobe of left lung (HCC) Stage IV lung adenocarcinoma with brain and bone metastasis.  PET scan images were reviewed with patient.- good partial response to chemotherapy per onc note 10/2  * Neutropenic fever ([place]) Primary non-small cell carcinoma of upper lobe of left lung (HCC) Enteritis. Rhinovirus infection. Patient fever is caused by enteritis and a rhinovirus infection.  Condition so far had improved, no additional fever.  Diarrhea has resolved since yesterday.  He was positive for rhinovirus,  but no respite symptoms.  No pneumonia. Currently he is medically stable to be discharged.   Hypokalemia. Hypophosphatemia. Potassium has normalized, will give 45 mmol of sodium phosphate before discharge.   Anemia secondary to chemotherapy. Received 1 unit of PRBC, hemoglobin more stable, follow-up with oncology as outpatient.     Dyslipidemia - We will continue statin therapy.   Essential hypertension Resume Norvasc.   Pulmonary embolus (HCC) Continue Eliquis. On MRI from 6/22: IMPRESSION: Decrease in size of left cerebellar hematoma with resolution of edema. No evidence of underlying lesion.   Smaller, more recent hemorrhage in the left cerebellar vermis with minimal edema. There is minimal enhancement without definite evidence of underlying lesion.   New punctate focus of chronic blood products and enhancement in the left frontal lobe. Additional new foci of chronic blood products in the posterior right putamen, right parietal subcortical white matter, and posterior right cerebellum. Unclear at this time if these represent foci of bland hemorrhage or early metastases.   Increase in size of right parietal osseous metastasis with minor extraosseous extension., Due to intraparenchymal hemorrhage eliquis dosed 2.5 mg pobid.    Type 2 diabetes mellitus without complications ([place]) Follow-up with PCP as outpatient.      11/26/21 I last saw the patient in May and he has not followed up with me since to address his diabetes.  He is taking oxycodone 7.5 mg twice daily for the pain in his back.  The pain in his lower back can be severe at times but he does not want to take pain medication more frequently due to his  desire to minimize the number of "chemicals" in his body.  He also states that he is not sleeping well.  He states that he wakes up 3-4 times every night to urinate.  His oncologist started him on Flomax which helped with urinary retention but has not reduced any of the  urinary frequency.  He may also have an element of overactive bladder and we discussed starting a medication to help with overactive bladder at night so that he can sleep better but he is hesitant to take this at this time.  He would like something to try to help him sleep as he is only able to get an hour or so of sleep intermittently.  He states that his blood sugars are typically 90-120.  He denies any blood sugars greater than 200.  He is only taking insulin 20 units on days when his blood sugar is over 120 in the morning.  He states that is typically 3-4 times a week.  On days when it is less than 100 he does not take any insulin.  Although not ideal, this strategy seems to be working for him based on his reported sugars.  His low back pain is getting worse and they have an MRI scheduled for next week to evaluate Past Medical History:  Diagnosis Date   Allergy    BPH (benign prostatic hypertrophy)    Cancer (HCC)    Melanoma on Neck    2008   Carotid artery occlusion    Diabetes mellitus    type 2   ED (erectile dysfunction)    GERD (gastroesophageal reflux disease)    Hemorrhagic stroke ([place]) 06/2021   Hyperlipidemia    Hypertension    Lung cancer ([place])    Retinopathy due to secondary DM [facility])    Past Surgical History:  Procedure Laterality Date   BRONCHIAL NEEDLE ASPIRATION BIOPSY  06/17/2021   Procedure: BRONCHIAL NEEDLE ASPIRATION BIOPSIES;  Surgeon: Candee Furbish, MD;  Location: [facility] ENDOSCOPY;  Service: Pulmonary;;   IR IMAGING GUIDED PORT INSERTION  08/05/2021   MELANOMA EXCISION  2008   Left side of neck   RADIOLOGY WITH ANESTHESIA N/A 04/05/2020   Procedure: MRI SPINE WITOUT CONTRAST;  Surgeon: Radiologist, Medication, MD;  Location: [place];  Service: Radiology;  Laterality: N/A;   RADIOLOGY WITH ANESTHESIA N/A 08/22/2021   Procedure: MRI BRAIN WITH AND WITHOUT CONTRAST  WITH ANESTHESIA;  Surgeon: Radiologist, Medication, MD;  Location: [place];  Service: Radiology;  Laterality: N/A;    TONSILLECTOMY     VIDEO BRONCHOSCOPY WITH ENDOBRONCHIAL ULTRASOUND N/A 06/17/2021   Procedure: VIDEO BRONCHOSCOPY WITH ENDOBRONCHIAL ULTRASOUND;  Surgeon: Candee Furbish, MD;  Location: [facility] ENDOSCOPY;  Service: Pulmonary;  Laterality: N/A;   Current Outpatient Medications on File Prior to Visit  Medication Sig Dispense Refill   apixaban (ELIQUIS) 2.5 MG TABS tablet Take 1 tablet (2.5 mg total) by mouth 2 (two) times daily. 60 tablet 3   atorvastatin (LIPITOR) 40 MG tablet TAKE 1 TABLET(40 MG) BY MOUTH DAILY 90 tablet 2   calcium-vitamin D (OSCAL WITH D) 500-5 MG-MCG tablet Take 2 tablets by mouth daily. 60 tablet 3   collagenase (SANTYL) 250 UNIT/GM ointment Apply 1 Application topically daily. (Patient not taking: Reported on 12/02/2021) 15 g 0   dexamethasone (DECADRON) 4 MG tablet Take 1 tab every 12 hours the day before pemetrexed chemo, then take 2 tabs once a day for 3 days starting the day after carboplatin. 30 tablet 1  folic acid (FOLVITE) 1 MG tablet Take 1 tablet (1 mg total) by mouth daily. Start 7 days before pemetrexed chemotherapy. Continue until 21 days after pemetrexed completed. 100 tablet 3   insulin glargine (LANTUS) 100 UNIT/ML Solostar Pen Inject 25 Units into the skin daily. (Patient taking differently: Inject 15-30 Units into the skin daily as needed (high blood sugar).) 15 mL 11   LORazepam (ATIVAN) 0.5 MG tablet Take 1 tablet (0.5 mg total) by mouth See admin instructions. Take 1 tablet 30 minutes prior to imaging procedure, may take another tablet if needed.Avoid driving after taking medication. (Patient not taking: Reported on 12/02/2021) 2 tablet 0   losartan (COZAAR) 50 MG tablet TAKE 1 TABLET(50 MG) BY MOUTH DAILY (Patient not taking: Reported on 11/19/2021) 90 tablet 0   NOVOLOG FLEXPEN 100 UNIT/ML FlexPen Inject into the skin. (Patient not taking: Reported on 11/19/2021)     omeprazole (PRILOSEC) 20 MG capsule Take 20 mg by mouth daily.     oxyCODONE-acetaminophen  (PERCOCET) 7.5-325 MG tablet Take 1 tablet by mouth every 4 (four) hours as needed for severe pain. 60 tablet 0   pantoprazole (PROTONIX) 40 MG tablet Take 1 tablet (40 mg total) by mouth daily at 6 (six) AM. (Patient not taking: Reported on 12/02/2021) 90 tablet 0   potassium chloride (KLOR-CON M) 10 MEQ tablet TAKE 1 TABLET(10 MEQ) BY MOUTH DAILY 90 tablet 0   tamsulosin (FLOMAX) 0.4 MG CAPS capsule TAKE 1 CAPSULE(0.4 MG) BY MOUTH DAILY 90 capsule 0   UNABLE TO FIND PLEASE CHECK FASTING BLOOD GLUCOSE EVERY MORNING 1 each 0   [DISCONTINUED] prochlorperazine (COMPAZINE) 10 MG tablet Take 1 tablet (10 mg total) by mouth every 6 (six) hours as needed (Nausea or vomiting). 30 tablet 1   No current facility-administered medications on file prior to visit.     Allergies  Allergen Reactions   Niaspan [Niacin]     FLUSHING   Social History   Socioeconomic History   Marital status: Married    Spouse name: Not on file   Number of children: Not on file   Years of education: Not on file   Highest education level: Not on file  Occupational History   Not on file  Tobacco Use   Smoking status: Former    Types: Cigarettes    Quit date: 07/21/1990    Years since quitting: 31.4   Smokeless tobacco: Never  Vaping Use   Vaping Use: Never used  Substance and Sexual Activity   Alcohol use: No   Drug use: No   Sexual activity: Not on file  Other Topics Concern   Not on file  Social History Narrative   Not on file   Social Determinants of Health   Financial Resource Strain: Low Risk  (11/21/2021)   Overall Financial Resource Strain (CARDIA)    Difficulty of Paying Living Expenses: Not hard at all  Food Insecurity: No Food Insecurity (11/19/2021)   Hunger Vital Sign    Worried About Running Out of Food in the Last Year: Never true    [place] in the Last Year: Never true  Transportation Needs: No Transportation Needs (11/21/2021)   PRAPARE - Hydrologist  (Medical): No    Lack of Transportation (Non-Medical): No  Physical Activity: Inactive (07/31/2021)   Exercise Vital Sign    Days of Exercise per Week: 0 days    Minutes of Exercise per Session: 0 min  Stress: Unknown (07/31/2021)  [employer] - Occupational Stress Questionnaire    Feeling of Stress : Patient refused  Social Connections: Unknown (07/31/2021)   Social Connection and Isolation Panel [NHANES]    Frequency of Communication with Friends and Family: Three times a week    Frequency of Social Gatherings with Friends and Family: Three times a week    Attends Religious Services: Patient refused    Active Member of Clubs or Organizations: Patient refused    Attends Archivist Meetings: Patient refused    Marital Status: Married  Human resources officer Violence: Not At Risk (07/31/2021)   Humiliation, Afraid, Rape, and Kick questionnaire    Fear of Current or Ex-Partner: No    Emotionally Abused: No    Physically Abused: No    Sexually Abused: No    Past Medical History:  Diagnosis Date   Allergy    BPH (benign prostatic hypertrophy)    Cancer ([place])    Melanoma on Neck    2008   Carotid artery occlusion    Diabetes mellitus    type 2   ED (erectile dysfunction)    GERD (gastroesophageal reflux disease)    Hemorrhagic stroke ([place]) 06/2021   Hyperlipidemia    Hypertension    Lung cancer ([place])    Retinopathy due to secondary DM [facility])    Past Surgical History:  Procedure Laterality Date   BRONCHIAL NEEDLE ASPIRATION BIOPSY  06/17/2021   Procedure: BRONCHIAL NEEDLE ASPIRATION BIOPSIES;  Surgeon: Candee Furbish, MD;  Location: [place];  Service: Pulmonary;;   IR IMAGING GUIDED PORT INSERTION  08/05/2021   MELANOMA EXCISION  2008   Left side of neck   RADIOLOGY WITH ANESTHESIA N/A 04/05/2020   Procedure: MRI SPINE WITOUT CONTRAST;  Surgeon: Radiologist, Medication, MD;  Location: [place];  Service: Radiology;  Laterality: N/A;   RADIOLOGY  WITH ANESTHESIA N/A 08/22/2021   Procedure: MRI BRAIN WITH AND WITHOUT CONTRAST  WITH ANESTHESIA;  Surgeon: Radiologist, Medication, MD;  Location: [place];  Service: Radiology;  Laterality: N/A;   TONSILLECTOMY     VIDEO BRONCHOSCOPY WITH ENDOBRONCHIAL ULTRASOUND N/A 06/17/2021   Procedure: VIDEO BRONCHOSCOPY WITH ENDOBRONCHIAL ULTRASOUND;  Surgeon: Candee Furbish, MD;  Location: [facility] ENDOSCOPY;  Service: Pulmonary;  Laterality: N/A;   Current Outpatient Medications on File Prior to Visit  Medication Sig Dispense Refill   apixaban (ELIQUIS) 2.5 MG TABS tablet Take 1 tablet (2.5 mg total) by mouth 2 (two) times daily. 60 tablet 3   atorvastatin (LIPITOR) 40 MG tablet TAKE 1 TABLET(40 MG) BY MOUTH DAILY 90 tablet 2   calcium-vitamin D (OSCAL WITH D) 500-5 MG-MCG tablet Take 2 tablets by mouth daily. 60 tablet 3   collagenase (SANTYL) 250 UNIT/GM ointment Apply 1 Application topically daily. (Patient not taking: Reported on 12/02/2021) 15 g 0   dexamethasone (DECADRON) 4 MG tablet Take 1 tab every 12 hours the day before pemetrexed chemo, then take 2 tabs once a day for 3 days starting the day after carboplatin. 30 tablet 1   folic acid (FOLVITE) 1 MG tablet Take 1 tablet (1 mg total) by mouth daily. Start 7 days before pemetrexed chemotherapy. Continue until 21 days after pemetrexed completed. 100 tablet 3   insulin glargine (LANTUS) 100 UNIT/ML Solostar Pen Inject 25 Units into the skin daily. (Patient taking differently: Inject 15-30 Units into the skin daily as needed (high blood sugar).) 15 mL 11   LORazepam (ATIVAN) 0.5 MG tablet Take 1 tablet (0.5 mg total)  by mouth See admin instructions. Take 1 tablet 30 minutes prior to imaging procedure, may take another tablet if needed.Avoid driving after taking medication. (Patient not taking: Reported on 12/02/2021) 2 tablet 0   losartan (COZAAR) 50 MG tablet TAKE 1 TABLET(50 MG) BY MOUTH DAILY (Patient not taking: Reported on 11/19/2021) 90 tablet 0   NOVOLOG  FLEXPEN 100 UNIT/ML FlexPen Inject into the skin. (Patient not taking: Reported on 11/19/2021)     omeprazole (PRILOSEC) 20 MG capsule Take 20 mg by mouth daily.     oxyCODONE-acetaminophen (PERCOCET) 7.5-325 MG tablet Take 1 tablet by mouth every 4 (four) hours as needed for severe pain. 60 tablet 0   pantoprazole (PROTONIX) 40 MG tablet Take 1 tablet (40 mg total) by mouth daily at 6 (six) AM. (Patient not taking: Reported on 12/02/2021) 90 tablet 0   potassium chloride (KLOR-CON M) 10 MEQ tablet TAKE 1 TABLET(10 MEQ) BY MOUTH DAILY 90 tablet 0   tamsulosin (FLOMAX) 0.4 MG CAPS capsule TAKE 1 CAPSULE(0.4 MG) BY MOUTH DAILY 90 capsule 0   UNABLE TO FIND PLEASE CHECK FASTING BLOOD GLUCOSE EVERY MORNING 1 each 0   [DISCONTINUED] prochlorperazine (COMPAZINE) 10 MG tablet Take 1 tablet (10 mg total) by mouth every 6 (six) hours as needed (Nausea or vomiting). 30 tablet 1   No current facility-administered medications on file prior to visit.   Allergies  Allergen Reactions   Niaspan [Niacin]     FLUSHING   Social History   Socioeconomic History   Marital status: Married    Spouse name: Not on file   Number of children: Not on file   Years of education: Not on file   Highest education level: Not on file  Occupational History   Not on file  Tobacco Use   Smoking status: Former    Types: Cigarettes    Quit date: 07/21/1990    Years since quitting: 31.4   Smokeless tobacco: Never  Vaping Use   Vaping Use: Never used  Substance and Sexual Activity   Alcohol use: No   Drug use: No   Sexual activity: Not on file  Other Topics Concern   Not on file  Social History Narrative   Not on file   Social Determinants of Health   Financial Resource Strain: Low Risk  (11/21/2021)   Overall Financial Resource Strain (CARDIA)    Difficulty of Paying Living Expenses: Not hard at all  Food Insecurity: No Food Insecurity (11/19/2021)   Hunger Vital Sign    Worried About Running Out of Food in the  Last Year: Never true    [place] in the Last Year: Never true  Transportation Needs: No Transportation Needs (11/21/2021)   PRAPARE - Hydrologist (Medical): No    Lack of Transportation (Non-Medical): No  Physical Activity: Inactive (07/31/2021)   Exercise Vital Sign    Days of Exercise per Week: 0 days    Minutes of Exercise per Session: 0 min  Stress: Unknown (07/31/2021)   [place]    Feeling of Stress : Patient refused  Social Connections: Unknown (07/31/2021)   Social Connection and Isolation Panel [NHANES]    Frequency of Communication with Friends and Family: Three times a week    Frequency of Social Gatherings with Friends and Family: Three times a week    Attends Religious Services: Patient refused    Active Member of Clubs or Organizations: Patient  refused    Attends Archivist Meetings: Patient refused    Marital Status: Married  Human resources officer Violence: Not At Risk (07/31/2021)   Humiliation, Afraid, Rape, and Kick questionnaire    Fear of Current or Ex-Partner: No    Emotionally Abused: No    Physically Abused: No    Sexually Abused: No      Review of Systems  All other systems reviewed and are negative.      Objective:   Physical Exam Vitals reviewed.  Constitutional:      General: He is not in acute distress.    Appearance: He is ill-appearing.  HENT:     Mouth/Throat:     Mouth: Mucous membranes are dry.  Cardiovascular:     Rate and Rhythm: Normal rate and regular rhythm.     Heart sounds: Normal heart sounds. No murmur heard.    No friction rub. No gallop.  Pulmonary:     Effort: Pulmonary effort is normal. No respiratory distress.     Breath sounds: Normal breath sounds. No stridor. No wheezing, rhonchi or rales.  Neurological:     General: No focal deficit present.     Mental Status: He is alert and oriented to person, place, and  time.     Cranial Nerves: No cranial nerve deficit.     Sensory: No sensory deficit.     Deep Tendon Reflexes: Reflexes normal.           Assessment & Plan:  Uncontrolled type 2 diabetes mellitus with hypoglycemia, unspecified hypoglycemia coma status (HCC) - Plan: Hemoglobin A1c, CBC with Differential/Platelet, BASIC METABOLIC PANEL WITH GFR  Intraparenchymal hemorrhage of brain (HCC)  Malignant neoplasm of upper lobe of left lung [facility])  Patient has metastatic lung cancer stage IV.  Therefore my goals in his diabetic management is just to keep his sugars under relatively good control to avoid complication.  Therefore I would be very happy with his blood sugars between 102 100.  His current strategy of taking Lantus 20 units on days when his fasting blood sugar is greater than 120 seems to be working and keeping his sugars under control.  I will for him to take 15 units daily try to be more consistent that at the present time if his hemoglobin A1c is less than 8 I will make no changes.  I will give the patient lorazepam 1 mg p.o. nightly to try to help with his insomnia.  Consider also trying something like Vesicare to help with overactive bladder to maybe reduce some of the nocturnal frequency.  I will be glad to refill his pain medication when necessary.  Await results of his MRI to determine the cause of his low back pain.  Offered the patient a flu shot today and recommended the COVID-vaccine however he would like to discuss this with his oncologist first which I certainly respect

## 2021-12-11 ENCOUNTER — Encounter: Payer: Self-pay | Admitting: Oncology

## 2021-12-11 LAB — CBC WITH DIFFERENTIAL/PLATELET
Absolute Monocytes: 304 cells/uL (ref 200–950)
Basophils Absolute: 20 cells/uL (ref 0–200)
Basophils Relative: 0.9 %
Eosinophils Absolute: 59 cells/uL (ref 15–500)
Eosinophils Relative: 2.7 %
HCT: 31.3 % — ABNORMAL LOW (ref 38.5–50.0)
Hemoglobin: 10.2 g/dL — ABNORMAL LOW (ref 13.2–17.1)
Lymphs Abs: 521 cells/uL — ABNORMAL LOW (ref 850–3900)
MCH: 30.1 pg (ref 27.0–33.0)
MCHC: 32.6 g/dL (ref 32.0–36.0)
MCV: 92.3 fL (ref 80.0–100.0)
MPV: 9.8 fL (ref 7.5–12.5)
Monocytes Relative: 13.8 %
Neutro Abs: 1296 cells/uL — ABNORMAL LOW (ref 1500–7800)
Neutrophils Relative %: 58.9 %
Platelets: 182 10*3/uL (ref 140–400)
RBC: 3.39 10*6/uL — ABNORMAL LOW (ref 4.20–5.80)
RDW: 15.8 % — ABNORMAL HIGH (ref 11.0–15.0)
Total Lymphocyte: 23.7 %
WBC: 2.2 10*3/uL — ABNORMAL LOW (ref 3.8–10.8)

## 2021-12-11 LAB — HEMOGLOBIN A1C
Hgb A1c MFr Bld: 7.7 % of total Hgb — ABNORMAL HIGH (ref <5.7)
Mean Plasma Glucose: 174 mg/dL
eAG (mmol/L): 9.7 mmol/L

## 2021-12-11 LAB — BASIC METABOLIC PANEL WITH GFR
BUN: 12 mg/dL (ref 7–25)
CO2: 26 mmol/L (ref 20–32)
Calcium: 8.7 mg/dL (ref 8.6–10.3)
Chloride: 99 mmol/L (ref 98–110)
Creat: 0.89 mg/dL (ref 0.70–1.28)
Glucose, Bld: 225 mg/dL — ABNORMAL HIGH (ref 65–99)
Potassium: 5 mmol/L (ref 3.5–5.3)
Sodium: 135 mmol/L (ref 135–146)
eGFR: 92 mL/min/{1.73_m2} (ref >=60)

## 2021-12-11 NOTE — Telephone Encounter (Signed)
Requested medication (s) are due for refill today: request 90 day  Requested medication (s) are on the active medication list: yes  Last refill:  12/10/21  Future visit scheduled no  Notes to clinic:  Unable to refill per protocol, last refill by provider 12/10/21 for 30, 0 rf. Pharmacy request 90 day. Routing for review.     Requested Prescriptions  Pending Prescriptions Disp Refills   losartan (COZAAR) 50 MG tablet [Pharmacy Med Name: LOSARTAN 50MG  TABLETS] 90 tablet     Sig: TAKE 1 TABLET(50 MG) BY MOUTH DAILY     Cardiovascular:  Angiotensin Receptor Blockers Passed - 12/10/2021  4:11 PM      Passed - Cr in normal range and within 180 days    Creat  Date Value Ref Range Status  12/10/2021 0.89 0.70 - 1.28 mg/dL Final   Creatinine, Urine  Date Value Ref Range Status  03/06/2016 162 20 - 370 mg/dL Final         Passed - K in normal range and within 180 days    Potassium  Date Value Ref Range Status  12/10/2021 5.0 3.5 - 5.3 mmol/L Final         Passed - Patient is not pregnant      Passed - Last BP in normal range    BP Readings from Last 1 Encounters:  12/10/21 122/70         Passed - Valid encounter within last 6 months    Recent Outpatient Visits           5 months ago Uncontrolled type 2 diabetes mellitus with hypoglycemia, unspecified hypoglycemia coma status ([place])   [place] [name], MD   6 months ago Uncontrolled type 2 diabetes mellitus with hypoglycemia, unspecified hypoglycemia coma status ([place])   [place] Pickard, Cammie Mcgee, MD   3 years ago Pr[place]   [place] Dennard Schaumann, Cammie Mcgee, MD   4 years ago Encounter for hepatitis C screening test for low risk patient   [place] [name], MD   4 years ago Type 2 diabetes mellitus without complication, with long-term current use of insulin ([place])   [facility] [place], [place] Nunnery, MD

## 2021-12-16 NOTE — Progress Notes (Signed)
PCP - [name], MD Cardiologist - [name], MD  Chest x-ray - 11/18/21 EKG - 11/21/21 ECHO - 06/19/21  Fasting Blood Sugar - 120-150 Checks Blood Sugar 1/day  Blood Thinner Instructions: Continue  ERAS Protcol - Clears until 0515  Anesthesia review: Y  Patient verbally denies any shortness of breath, fever, cough and chest pain during phone call   -------------  SDW INSTRUCTIONS given:  Your procedure is scheduled on 12/17/21.  Report to [facility] Main Entrance "A" at 0545 A.M., and check in at the Admitting office.  Call this number if you have problems the morning of surgery:  [phone]   Remember:  Do not eat after midnight the night before your surgery  You may drink clear liquids until 0515 the morning of your surgery.   Clear liquids allowed are: Water, Non-Citrus Juices (without pulp), Carbonated Beverages, Clear Tea, Black Coffee Only, and Gatorade    Take these medicines the morning of surgery with A SIP OF WATER  apixaban (ELIQUIS)  omeprazole (PRILOSEC)  oxyCODONE-acetaminophen (PERCOCET)-if needed  insulin glargine (LANTUS) take 12.5 units the morning of surgery  ONLY take 1/2 of the normal dose of Novolog the morning of surgery IF your blood sugar is over 220  .** PLEASE check your blood sugar the morning of your surgery when you wake up and every 2 hours until you get to the Short Stay unit.  If your blood sugar is less than 70 mg/dL, you will need to treat for low blood sugar: Do not take insulin. Treat a low blood sugar (less than 70 mg/dL) with  cup of clear juice (cranberry or apple), 4 glucose tablets, OR glucose gel. Recheck blood sugar in 15 minutes after treatment (to make sure it is greater than 70 mg/dL). If your blood sugar is not greater than 70 mg/dL on recheck, call [phone] for further instructions.   As of today, STOP all herbal medications, fish oil, and all vitamins.                      Do not wear jewelry, make up,  or nail polish            Do not wear lotions, powders, perfumes/colognes, or deodorant.            Do not shave 48 hours prior to surgery.  Men may shave face and neck.            Do not bring valuables to the hospital.            [facility] is not responsible for any belongings or valuables.  Do NOT Smoke (Tobacco/Vaping) 24 hours prior to your procedure If you use a CPAP at night, you may bring all equipment for your overnight stay.   Contacts, glasses, dentures or bridgework may not be worn into surgery.      For patients admitted to the hospital, discharge time will be determined by your treatment team.   Patients discharged the day of surgery will not be allowed to drive home, and someone needs to stay with them for 24 hours.    Questions were answered. Patient verbalized understanding of instructions.

## 2021-12-17 ENCOUNTER — Ambulatory Visit (HOSPITAL_COMMUNITY): Payer: PPO | Admitting: Physician Assistant

## 2021-12-17 ENCOUNTER — Ambulatory Visit (HOSPITAL_BASED_OUTPATIENT_CLINIC_OR_DEPARTMENT_OTHER): Payer: PPO | Admitting: Physician Assistant

## 2021-12-17 ENCOUNTER — Other Ambulatory Visit: Payer: Self-pay

## 2021-12-17 ENCOUNTER — Ambulatory Visit (HOSPITAL_COMMUNITY)
Admission: AD | Admit: 2021-12-17 | Discharge: 2021-12-17 | Disposition: A | Discharge status: Home / Self Care | Payer: PPO | Source: Ambulatory Visit | Attending: Radiation Oncology | Admitting: Radiation Oncology

## 2021-12-17 ENCOUNTER — Encounter (HOSPITAL_COMMUNITY): Admission: AD | Disposition: A | Discharge status: Home / Self Care | Payer: Self-pay | Source: Ambulatory Visit

## 2021-12-17 ENCOUNTER — Encounter (HOSPITAL_COMMUNITY): Payer: Self-pay

## 2021-12-17 ENCOUNTER — Ambulatory Visit (HOSPITAL_COMMUNITY)
Admission: RE | Admit: 2021-12-17 | Discharge: 2021-12-17 | Disposition: A | Discharge status: Home / Self Care | Payer: PPO | Source: Ambulatory Visit | Attending: Radiation Oncology | Admitting: Radiation Oncology

## 2021-12-17 ENCOUNTER — Ambulatory Visit (HOSPITAL_COMMUNITY)
Admission: RE | Admit: 2021-12-17 | Discharge: 2021-12-17 | Disposition: A | Discharge status: Home / Self Care | Payer: PPO | Source: Ambulatory Visit | Attending: Oncology | Admitting: Oncology

## 2021-12-17 DIAGNOSIS — C7931 Secondary malignant neoplasm of brain: Secondary | ICD-10-CM | POA: Diagnosis not present

## 2021-12-17 DIAGNOSIS — C3412 Malignant neoplasm of upper lobe, left bronchus or lung: Secondary | ICD-10-CM

## 2021-12-17 DIAGNOSIS — K529 Noninfective gastroenteritis and colitis, unspecified: Secondary | ICD-10-CM | POA: Insufficient documentation

## 2021-12-17 DIAGNOSIS — E119 Type 2 diabetes mellitus without complications: Secondary | ICD-10-CM | POA: Diagnosis not present

## 2021-12-17 DIAGNOSIS — Z7901 Long term (current) use of anticoagulants: Secondary | ICD-10-CM | POA: Insufficient documentation

## 2021-12-17 DIAGNOSIS — Z86718 Personal history of other venous thrombosis and embolism: Secondary | ICD-10-CM | POA: Insufficient documentation

## 2021-12-17 DIAGNOSIS — M5126 Other intervertebral disc displacement, lumbar region: Secondary | ICD-10-CM | POA: Insufficient documentation

## 2021-12-17 DIAGNOSIS — K219 Gastro-esophageal reflux disease without esophagitis: Secondary | ICD-10-CM | POA: Diagnosis not present

## 2021-12-17 DIAGNOSIS — C3490 Malignant neoplasm of unspecified part of unspecified bronchus or lung: Secondary | ICD-10-CM | POA: Diagnosis not present

## 2021-12-17 DIAGNOSIS — Z87891 Personal history of nicotine dependence: Secondary | ICD-10-CM | POA: Diagnosis not present

## 2021-12-17 DIAGNOSIS — M48061 Spinal stenosis, lumbar region without neurogenic claudication: Secondary | ICD-10-CM | POA: Insufficient documentation

## 2021-12-17 DIAGNOSIS — Z8673 Personal history of transient ischemic attack (TIA), and cerebral infarction without residual deficits: Secondary | ICD-10-CM | POA: Insufficient documentation

## 2021-12-17 DIAGNOSIS — C7951 Secondary malignant neoplasm of bone: Secondary | ICD-10-CM | POA: Insufficient documentation

## 2021-12-17 DIAGNOSIS — I10 Essential (primary) hypertension: Secondary | ICD-10-CM

## 2021-12-17 DIAGNOSIS — G9389 Other specified disorders of brain: Secondary | ICD-10-CM | POA: Diagnosis not present

## 2021-12-17 DIAGNOSIS — D630 Anemia in neoplastic disease: Secondary | ICD-10-CM | POA: Diagnosis not present

## 2021-12-17 DIAGNOSIS — M5127 Other intervertebral disc displacement, lumbosacral region: Secondary | ICD-10-CM | POA: Diagnosis not present

## 2021-12-17 DIAGNOSIS — S32020A Wedge compression fracture of second lumbar vertebra, initial encounter for closed fracture: Secondary | ICD-10-CM | POA: Diagnosis not present

## 2021-12-17 HISTORY — PX: RADIOLOGY WITH ANESTHESIA: SHX6223

## 2021-12-17 LAB — GLUCOSE, CAPILLARY
Glucose-Capillary: 121 mg/dL — ABNORMAL HIGH (ref 70–99)
Glucose-Capillary: 124 mg/dL — ABNORMAL HIGH (ref 70–99)

## 2021-12-17 SURGERY — MRI WITH ANESTHESIA
Anesthesia: General

## 2021-12-17 MED ORDER — ROCURONIUM BROMIDE 10 MG/ML (PF) SYRINGE
PREFILLED_SYRINGE | INTRAVENOUS | Status: DC | PRN
  Administered 2021-12-17: 30 mg via INTRAVENOUS

## 2021-12-17 MED ORDER — LACTATED RINGERS IV SOLN: INTRAVENOUS | Status: DC

## 2021-12-17 MED ORDER — ONDANSETRON HCL 4 MG/2ML IJ SOLN
INTRAMUSCULAR | Status: DC | PRN
  Administered 2021-12-17: 4 mg via INTRAVENOUS

## 2021-12-17 MED ORDER — DEXAMETHASONE SODIUM PHOSPHATE 10 MG/ML IJ SOLN
INTRAMUSCULAR | Status: DC | PRN
  Administered 2021-12-17: 10 mg via INTRAVENOUS

## 2021-12-17 MED ORDER — SUGAMMADEX SODIUM 200 MG/2ML IV SOLN
INTRAVENOUS | Status: DC | PRN
  Administered 2021-12-17: 200 mg via INTRAVENOUS

## 2021-12-17 MED ORDER — PHENYLEPHRINE HCL-NACL 20-0.9 MG/250ML-% IV SOLN
INTRAVENOUS | Status: DC | PRN
  Administered 2021-12-17: 25 ug/min via INTRAVENOUS

## 2021-12-17 MED ORDER — PROPOFOL 10 MG/ML IV BOLUS
INTRAVENOUS | Status: DC | PRN
  Administered 2021-12-17: 150 mg via INTRAVENOUS

## 2021-12-17 MED ORDER — PHENYLEPHRINE 80 MCG/ML (10ML) SYRINGE FOR IV PUSH (FOR BLOOD PRESSURE SUPPORT)
PREFILLED_SYRINGE | INTRAVENOUS | Status: DC | PRN
  Administered 2021-12-17: 160 ug via INTRAVENOUS

## 2021-12-17 MED ORDER — CHLORHEXIDINE GLUCONATE 0.12 % MT SOLN
OROMUCOSAL | Status: AC
  Filled 2021-12-17: qty 15

## 2021-12-17 MED ORDER — FENTANYL CITRATE (PF) 100 MCG/2ML IJ SOLN
INTRAMUSCULAR | Status: DC | PRN
  Administered 2021-12-17: 50 ug via INTRAVENOUS

## 2021-12-17 MED ORDER — GADOBUTROL 1 MMOL/ML IV SOLN
6.0000 mL | Freq: Once | INTRAVENOUS | Status: AC | PRN
  Administered 2021-12-17: 6 mL via INTRAVENOUS

## 2021-12-17 MED ORDER — LIDOCAINE 2% (20 MG/ML) 5 ML SYRINGE
INTRAMUSCULAR | Status: DC | PRN
  Administered 2021-12-17: 40 mg via INTRAVENOUS

## 2021-12-17 MED ORDER — ORAL CARE MOUTH RINSE: 15.0000 mL | Freq: Once | OROMUCOSAL | Status: AC

## 2021-12-17 MED ORDER — CHLORHEXIDINE GLUCONATE 0.12 % MT SOLN
15.0000 mL | Freq: Once | OROMUCOSAL | Status: AC
  Administered 2021-12-17: 15 mL via OROMUCOSAL

## 2021-12-17 MED ORDER — INSULIN ASPART 100 UNIT/ML IJ SOLN: 0.0000 [IU] | INTRAMUSCULAR | Status: DC | PRN

## 2021-12-17 NOTE — Anesthesia Postprocedure Evaluation (Signed)
Anesthesia Post Note  Patient: HERMANN DOTTAVIO  Procedure(s) Performed: MRI BRAIN WITH AND WITHOUT CONTRAST WITH ANESTHESIA; MRI LUMBER WITH AND WITHOUT CONTRAST     Patient location during evaluation: PACU Anesthesia Type: General Level of consciousness: awake and alert Pain management: pain level controlled Vital Signs Assessment: post-procedure vital signs reviewed and stable Respiratory status: spontaneous breathing, nonlabored ventilation, respiratory function stable and patient connected to nasal cannula oxygen Cardiovascular status: blood pressure returned to baseline and stable Postop Assessment: no apparent nausea or vomiting Anesthetic complications: no   No notable events documented.  Last Vitals:  Vitals:   12/17/21 1015 12/17/21 1025  BP: 125/65 122/68  Pulse: 78 79  Resp: 15 15  Temp:  36.6 C  SpO2: 98% 98%    Last Pain:  Vitals:   12/17/21 1025  TempSrc:   PainSc: 0-No pain                 Tiajuana Amass

## 2021-12-17 NOTE — Anesthesia Procedure Notes (Signed)
Procedure Name: Intubation Date/Time: 12/17/2021 8:25 AM  Performed by: Colin Benton, CRNAPre-anesthesia Checklist: Patient identified, Emergency Drugs available, Suction available and Patient being monitored Patient Re-evaluated:Patient Re-evaluated prior to induction Oxygen Delivery Method: Circle system utilized Preoxygenation: Pre-oxygenation with 100% oxygen Induction Type: IV induction Ventilation: Mask ventilation without difficulty Laryngoscope Size: Miller and 2 Grade View: Grade I Tube type: Oral Tube size: 7.5 mm Number of attempts: 1 Airway Equipment and Method: Stylet Placement Confirmation: ETT inserted through vocal cords under direct vision, positive ETCO2 and breath sounds checked- equal and bilateral Tube secured with: Tape Dental Injury: Teeth and Oropharynx as per pre-operative assessment

## 2021-12-17 NOTE — Anesthesia Preprocedure Evaluation (Addendum)
Anesthesia Evaluation  Patient identified by MRN, date of birth, ID band Patient awake    Reviewed: Allergy & Precautions, NPO status , Patient's Chart, lab work & pertinent test results  Airway Mallampati: II  TM Distance: >3 FB Neck ROM: Full    Dental   Pulmonary former smoker,  LUL non small cell Lung CA   breath sounds clear to auscultation       Cardiovascular hypertension, Pt. on medications + DVT   Rhythm:Regular Rate:Normal     Neuro/Psych  Headaches,  Neuromuscular disease CVA    GI/Hepatic Neg liver ROS, GERD  ,  Endo/Other  diabetes, Type 2  Renal/GU negative Renal ROS     Musculoskeletal   Abdominal   Peds  Hematology  (+) Blood dyscrasia, anemia ,   Anesthesia Other Findings   Reproductive/Obstetrics                             Anesthesia Physical Anesthesia Plan  ASA: 4  Anesthesia Plan: General   Post-op Pain Management:    Induction: Intravenous  PONV Risk Score and Plan: 2 and Dexamethasone, Ondansetron and Treatment may vary due to age or medical condition  Airway Management Planned: Oral ETT  Additional Equipment: None  Intra-op Plan:   Post-operative Plan: Extubation in OR  Informed Consent: I have reviewed the patients History and Physical, chart, labs and discussed the procedure including the risks, benefits and alternatives for the proposed anesthesia with the patient or authorized representative who has indicated his/her understanding and acceptance.     Dental advisory given  Plan Discussed with: CRNA  Anesthesia Plan Comments:        Anesthesia Quick Evaluation

## 2021-12-17 NOTE — Transfer of Care (Addendum)
Immediate Anesthesia Transfer of Care Note  Patient: Micheal Donovan  Procedure(s) Performed: MRI BRAIN WITH AND WITHOUT CONTRAST WITH ANESTHESIA; MRI LUMBER WITH AND WITHOUT CONTRAST  Patient Location: PACU  Anesthesia Type:General  Level of Consciousness: drowsy  Airway & Oxygen Therapy: Patient Spontanous Breathing and Patient connected to nasal cannula oxygen  Post-op Assessment: Report given to RN and Post -op Vital signs reviewed and stable  Post vital signs: Reviewed and stable  Last Vitals:  Vitals Value Taken Time  BP 144/83 12/17/21 1001  Temp    Pulse 78 12/17/21 1000  Resp 15 12/17/21 1000  SpO2 100 % 12/17/21 1000  Vitals shown include unvalidated device data.  Last Pain:  Vitals:   12/17/21 0648  TempSrc:   PainSc: 0-No pain         Complications: No notable events documented.

## 2021-12-18 ENCOUNTER — Encounter (HOSPITAL_COMMUNITY): Payer: Self-pay | Admitting: Radiology

## 2021-12-23 ENCOUNTER — Encounter: Payer: Self-pay | Admitting: Radiation Oncology

## 2021-12-23 ENCOUNTER — Encounter: Payer: Self-pay | Admitting: Oncology

## 2021-12-23 ENCOUNTER — Inpatient Hospital Stay: Payer: PPO

## 2021-12-23 ENCOUNTER — Inpatient Hospital Stay (HOSPITAL_BASED_OUTPATIENT_CLINIC_OR_DEPARTMENT_OTHER): Payer: PPO | Admitting: Oncology

## 2021-12-23 ENCOUNTER — Ambulatory Visit
Admission: RE | Admit: 2021-12-23 | Discharge: 2021-12-23 | Disposition: A | Discharge status: Home / Self Care | Payer: PPO | Source: Ambulatory Visit | Attending: Radiation Oncology | Admitting: Radiation Oncology

## 2021-12-23 VITALS — BP 143/80 | HR 83 | Temp 98.0°F | Resp 18 | Ht 67.0 in | Wt 147.0 lb

## 2021-12-23 VITALS — BP 147/74 | HR 74 | Temp 96.5°F | Wt 148.5 lb

## 2021-12-23 DIAGNOSIS — Z5111 Encounter for antineoplastic chemotherapy: Secondary | ICD-10-CM | POA: Diagnosis not present

## 2021-12-23 DIAGNOSIS — D6481 Anemia due to antineoplastic chemotherapy: Secondary | ICD-10-CM

## 2021-12-23 DIAGNOSIS — T451X5A Adverse effect of antineoplastic and immunosuppressive drugs, initial encounter: Secondary | ICD-10-CM | POA: Diagnosis not present

## 2021-12-23 DIAGNOSIS — C3412 Malignant neoplasm of upper lobe, left bronchus or lung: Secondary | ICD-10-CM

## 2021-12-23 DIAGNOSIS — D518 Other vitamin B12 deficiency anemias: Secondary | ICD-10-CM | POA: Diagnosis not present

## 2021-12-23 DIAGNOSIS — C7951 Secondary malignant neoplasm of bone: Secondary | ICD-10-CM | POA: Diagnosis not present

## 2021-12-23 DIAGNOSIS — I2699 Other pulmonary embolism without acute cor pulmonale: Secondary | ICD-10-CM | POA: Diagnosis not present

## 2021-12-23 LAB — COMPREHENSIVE METABOLIC PANEL
ALT: 19 U/L (ref 0–44)
AST: 31 U/L (ref 15–41)
Albumin: 3.4 g/dL — ABNORMAL LOW (ref 3.5–5.0)
Alkaline Phosphatase: 100 U/L (ref 38–126)
Anion gap: 10 (ref 5–15)
BUN: 12 mg/dL (ref 8–23)
CO2: 23 mmol/L (ref 22–32)
Calcium: 8.7 mg/dL — ABNORMAL LOW (ref 8.9–10.3)
Chloride: 97 mmol/L — ABNORMAL LOW (ref 98–111)
Creatinine, Ser: 0.96 mg/dL (ref 0.61–1.24)
GFR, Estimated: >60 mL/min (ref >60)
Glucose, Bld: 290 mg/dL — ABNORMAL HIGH (ref 70–99)
Potassium: 3.9 mmol/L (ref 3.5–5.1)
Sodium: 130 mmol/L — ABNORMAL LOW (ref 135–145)
Total Bilirubin: 0.2 mg/dL — ABNORMAL LOW (ref 0.3–1.2)
Total Protein: 7.2 g/dL (ref 6.5–8.1)

## 2021-12-23 LAB — CBC WITH DIFFERENTIAL/PLATELET
Abs Immature Granulocytes: 0.1 10*3/uL — ABNORMAL HIGH (ref 0.00–0.07)
Basophils Absolute: 0 10*3/uL (ref 0.0–0.1)
Basophils Relative: 0 %
Eosinophils Absolute: 0 10*3/uL (ref 0.0–0.5)
Eosinophils Relative: 0 %
HCT: 30.3 % — ABNORMAL LOW (ref 39.0–52.0)
Hemoglobin: 9.8 g/dL — ABNORMAL LOW (ref 13.0–17.0)
Immature Granulocytes: 1 %
Lymphocytes Relative: 7 %
Lymphs Abs: 0.7 10*3/uL (ref 0.7–4.0)
MCH: 30.8 pg (ref 26.0–34.0)
MCHC: 32.3 g/dL (ref 30.0–36.0)
MCV: 95.3 fL (ref 80.0–100.0)
Monocytes Absolute: 0.9 10*3/uL (ref 0.1–1.0)
Monocytes Relative: 9 %
Neutro Abs: 8.5 10*3/uL — ABNORMAL HIGH (ref 1.7–7.7)
Neutrophils Relative %: 83 %
Platelets: 368 10*3/uL (ref 150–400)
RBC: 3.18 MIL/uL — ABNORMAL LOW (ref 4.22–5.81)
RDW: 18.3 % — ABNORMAL HIGH (ref 11.5–15.5)
WBC: 10.2 10*3/uL (ref 4.0–10.5)
nRBC: 0 % (ref 0.0–0.2)

## 2021-12-23 MED ORDER — PROCHLORPERAZINE MALEATE 10 MG PO TABS
10.0000 mg | ORAL_TABLET | Freq: Once | ORAL | Status: AC
  Administered 2021-12-23: 10 mg via ORAL
  Filled 2021-12-23: qty 1

## 2021-12-23 MED ORDER — ZOLEDRONIC ACID 4 MG/100ML IV SOLN
4.0000 mg | Freq: Once | INTRAVENOUS | Status: AC
  Administered 2021-12-23: 4 mg via INTRAVENOUS
  Filled 2021-12-23: qty 100

## 2021-12-23 MED ORDER — HEPARIN SOD (PORK) LOCK FLUSH 100 UNIT/ML IV SOLN
500.0000 [IU] | Freq: Once | INTRAVENOUS | Status: AC | PRN
  Administered 2021-12-23: 500 [IU]
  Filled 2021-12-23: qty 5

## 2021-12-23 MED ORDER — SODIUM CHLORIDE 0.9 % IV SOLN
Freq: Once | INTRAVENOUS | Status: AC
  Filled 2021-12-23: qty 250

## 2021-12-23 MED ORDER — SODIUM CHLORIDE 0.9 % IV SOLN
350.0000 mg/m2 | Freq: Once | INTRAVENOUS | Status: AC
  Administered 2021-12-23: 600 mg via INTRAVENOUS
  Filled 2021-12-23: qty 4

## 2021-12-23 MED ORDER — HEPARIN SOD (PORK) LOCK FLUSH 100 UNIT/ML IV SOLN
INTRAVENOUS | Status: AC
  Filled 2021-12-23: qty 5

## 2021-12-23 NOTE — Assessment & Plan Note (Signed)
Hemoglobin is  Decreased.due to chemotherapy.  Monitor closely

## 2021-12-23 NOTE — Assessment & Plan Note (Addendum)
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  PET scan images were reviewed with patient.- good partial response.  Labs are reviewed and discussed with patient. Proceed with maintenance Alimta/Keytruda today. -dose reduce Alimta 350mg /m2 I am holding Keytruda due to persistent radiographic evidence of enteritis. Will resume after repeat CT scan in the future, if enteritis resolves.   #Hypokalemia, recommend potassium supplementation 90meq daily.

## 2021-12-23 NOTE — Progress Notes (Addendum)
Hematology/Oncology Progress note [phone] [phone]            Patient Care Team: [name], MD as PCP - General (Family Medicine) [name], RN as Oncology Nurse Navigator   ASSESSMENT & PLAN:   Cancer Staging  Primary non-small cell carcinoma of upper lobe of left lung [facility]) Staging form: Lung, AJCC 8th Edition - Clinical: Stage IV (cT2b, cN1, cM1) - Signed by [name], MD on 08/06/2021   Primary non-small cell carcinoma of upper lobe of left lung ([name]) Stage IV lung adenocarcinoma with brain and bone metastasis.  PET scan images were reviewed with patient.- good partial response.  Labs are reviewed and discussed with patient. Proceed with maintenance Alimta/Keytruda today. -dose reduce Alimta 356m/m2 I am holding Keytruda due to persistent radiographic evidence of enteritis. Will resume after repeat CT scan in the future, if enteritis resolves.   #Hypokalemia, recommend potassium supplementation 160m daily.  Encounter for antineoplastic chemotherapy Chemotherapy plan as listed above  Pulmonary embolus ([facility] brain shows persistent microbleeds.   Stop  Eliquis 2.60m39mID.   Anemia due to antineoplastic chemotherapy Hemoglobin is  Decreased.due to chemotherapy.  Monitor closely  B12 deficiency anemia continue  B12 injection every 6 weeks.   Metastasis to bone (HCC) Recommend Zometa every 4- 6 weeks. Recommend calcium supplementation.   Above plan was updated to daughter Micheal Donovan Placed This Encounter  Procedures   CBC with Differential    Standing Status:   Future    Standing Expiration Date:   02/26/2023   Comprehensive metabolic panel    Standing Status:   Future    Standing Expiration Date:   02/26/2023   Follow up Lab MD Alimta keyBeryle Flock weeks  All questions were answered. The patient knows to call the clinic with any problems, questions or concerns.  [name], [name] [facility] H[facility]  Oncology 12/23/2021      CHIEF COMPLAINTS/REASON FOR VISIT:  non-small cell lung cancer  HISTORY OF PRESENTING ILLNESS:   Micheal Donovan a  71 39o.  male presents for follow up of Non-small cell lung cancer.  Oncology History Overview Note  Diagnosis: Stage IIB T2b N1 M0 adenocarcinoma of the LUL, poorly differentiated     Primary non-small cell carcinoma of upper lobe of left lung ([facility]/2023 Initial Diagnosis   [facility]-small [facility], [facility] to progressive headache/dizziness/gait changes. CT head showed acute to subacute intraparenchymal hemorrhage involving the left cerebellum.  Surrounding low-density vasogenic edema.  [facility] CT angiogram of the neck which showed bulky calcified plaques/stenosis of carotid artery, Followed by MRI brain. 06/12/2021, MRI of the brain showed no significant interval change in size of the left cerebellar intraparenchymal hematoma with unchanged regional mass effect and partial effacement of fourth ventricle but no upstream hydrocephalus. There is no discernible enhancement to suggest underlying mass lesion, though acute blood products could mask enhancement.   06/12/2021 a chest x-ray showed a 4.4 cm left middle lobe. 06/13/2021, CT chest with contrast showed a 4.3 x 3.6 x 3.3 cm lobular spiculated mass in the posterior left upper lobe with T3 to the lateral pleura and major fissure.  Metastatic left hilar lymphadenopathy.  Peripheral micronodularity posterior right costophrenic sulcus.  Aortic atherosclerosis. 06/14/2021, CT abdomen pelvis showed right lower lobe pulmonary artery embolus.  No  evidence of right heart strain.  No acute intra-abdominal or pelvic pathology.  Aortic atherosclerosis.  06/13/2021, patient underwent bronchoscopy with EBUS by Dr. Tamala Julian.  Biopsy from the  fine-needle aspiration of station 11 mL showed malignant cells, consistent with poorly differentiated non-small cell carcinoma, consistent with adenocarcinoma.  Malignant cells are TTF-1 positive and negative for p40.  Negative for neuroendocrine markers.  Blood test and tissue sample were tested for Gardant 360  -PD-L1 TPS 97%, no actionable mutation on the blood testing. 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   07/17/2021 Cancer Staging   Staging form: Lung, AJCC 8th Edition - Clinical: Stage IV (cT2b, cN1, cM1) - Signed by [name], MD on 08/06/2021   08/06/2021 Imaging   I saw patient's PET scan after his visit with me on 08/06/21. PET scan was ordered by his previous oncologist group and did not come to my in basket.. Patient received cycle 1 carboplatin Taxol today.  He has started on radiation.  5/26/3 PET scan showed 3.8 cm hypermetabolic left upper lobe mass with hypermetabolic left hilar and infrahilar adenopathy.  There is approximately 8 scattered metastatic lesions in the skeleton. Mixed density photopenic lesion in the left cerebellum. characterized as hemorrhage on recent prior imaging workups.    08/06/2021 - 08/06/2021 Chemotherapy   Patient is on Treatment Plan : LUNG Carboplatin / Paclitaxel + XRT q7d     08/16/2021 -  Chemotherapy   Switched to systemic chemotherapy with  Carboplatin (4.5) + Pemetrexed (400) + Pembrolizumab (200) D1 q21d Induction x 4 cycles      08/16/2021 -  Chemotherapy   Patient is on Treatment Plan : LUNG Carboplatin (5) + Pemetrexed + Pembrolizumab (200) D1 q21d Induction x 4 cycles / Maintenance Pemetrexed + Pembrolizumab (200) D1 q21d     08/22/2021 Imaging   MRI brain w wo contrast  Decrease in size of left cerebellar hematoma with resolution of edema. No evidence of underlying lesion. Smaller, more recent hemorrhage in the left cerebellar vermis with minimal edema. There is minimal enhancement without definite evidence of underlying  lesion.  New punctate focus of chronic blood products and enhancement in the left frontal lobe. Additional new foci of chronic blood products in the posterior right putamen, right parietal subcortical white matter, and posterior right cerebellum. Unclear at this time if these represent foci of bland hemorrhage or early metastases.   Increase in size of right parietal osseous metastasis with minor extraosseous extension.    10/22/2021 - 10/22/2021 Chemotherapy   Patient is on Treatment Plan : LUNG Carboplatin (5) + Pemetrexed (500) + Pembrolizumab (200) D1 q21d Induction x 4 cycles / Maintenance Pemetrexed (500) + Pembrolizumab (200) D1 q21d      Imaging   PET scan showed 1. Interval response to therapy as evidenced by a small residual left upper lobe nodule with decreased hypermetabolism, no residual hypermetabolic adenopathy and decreased hypermetabolism associatedwith osseous metastases. 2. 6 mm posterior left upper lobe nodule, likely stable. Recommend attention on follow-up. 3. Aortic atherosclerosis (ICD10-I70.0). Coronary artery calcification.   12/17/2021 Imaging   MRI lumbar spine w wo contrast  1. No evidence of regional metastatic disease. 2. Late subacute superior endplate fracture at L2 and large superior endplate Schmorl's node at L5, unchanged since the PET scan of 10/29/2021. 3. L3-4: Shallow disc protrusion. Facet and ligamentous hypertrophy. Stenosis of both lateral recesses. Findings slightly worsened since 2022. 4. L4-5: Shallow disc protrusion. Facet and ligamentous hypertrophy. Small synovial cyst arising from the facet  joint on the right. Stenosis of the lateral recesses right worse than left. Foraminal narrowing right worse than left. Findings have worsened since 2022.  5. L5-S1: Endplate osteophytes and shallow protrusion of the disc. Facet and ligamentous hypertrophy. Stenosis of the subarticular lateral recesses and neural foramina, right worse than left. Similar  appearance to the study of 2022. 6. Continued evidence of enteritis of at least 1 loop of small bowel. Distended bladder present   12/18/2021 Imaging   Brian MRI w wo  1. Interval decrease in size of the left cerebellar and vermian hematomas. No definite evidence of underlying metastatic lesion. 2. There is are two possible contrast enhancing lesions in the posterior left frontal lobe and left occipital lobe, which do not have intrinsic T1 signal abnormality, but have a somewhat linear appearance and may be vascular in nature. Recommend attention on follow up. 3. Multifocal sites of susceptibility artifact are redemonstrated with interval development of a few new foci, as described above. All of these sites demonstrate intrinsic T1 hyperintense signal abnormality and susceptibility artifact and are compatible with sites of microhemorrhages. Recommend continued attention on follow up. 4. Interval decrease in size of right parietal calvarial metastatic lesion. No new contrast enhancing lesions visualized. 5. Diffusely heterogeneous marrow signal throughout the cervical spine, which is nonspecific but can be seen in the setting of anemia, smoking, obesity, or a marrow replacement process.     # Patient has a history of melanoma on his neck, treated in 2009. # History of hemorrhagic infarct left cerebellum  INTERVAL HISTORY TRYCE SURRATT is a 71 y.o. male who has above history reviewed by me today presents for follow up visit for management of  Stage IV lung adenocarcianoma Patient has gained weight.  Overall tolerates chemotherapy with mild to moderate difficulties.  # Pulmonary embolism, SOB is stable, not worse.  +chronic fatigue.  + Insomnia and nocturia. On Ativan QHS.    Review of Systems  Constitutional:  Positive for fatigue. Negative for appetite change, chills, fever and unexpected weight change.  HENT:   Negative for hearing loss and voice change.   Eyes:  Negative for eye problems  and icterus.  Respiratory:  Positive for shortness of breath. Negative for chest tightness and cough.   Cardiovascular:  Negative for chest pain and leg swelling.  Gastrointestinal:  Negative for abdominal distention, abdominal pain and blood in stool.  Endocrine: Negative for hot flashes.  Genitourinary:  Negative for difficulty urinating, dysuria and frequency.   Musculoskeletal:  Negative for arthralgias.  Skin:  Negative for itching and rash.  Neurological:  Negative for extremity weakness, headaches, light-headedness and numbness.  Hematological:  Negative for adenopathy. Does not bruise/bleed easily.  Psychiatric/Behavioral:  Positive for sleep disturbance. Negative for confusion.     MEDICAL HISTORY:  Past Medical History:  Diagnosis Date   Allergy    BPH (benign prostatic hypertrophy)    Cancer (HCC)    Melanoma on Neck    2008   Carotid artery occlusion    Diabetes mellitus    type 2   ED (erectile dysfunction)    GERD (gastroesophageal reflux disease)    Hemorrhagic stroke ([place]) 06/2021   Hyperlipidemia    Hypertension    Lung cancer ([place])    Retinopathy due to secondary DM [facility])     SURGICAL HISTORY: Past Surgical History:  Procedure Laterality Date   BRONCHIAL NEEDLE ASPIRATION BIOPSY  06/17/2021   Procedure: BRONCHIAL NEEDLE ASPIRATION BIOPSIES;  Surgeon: Candee Furbish,  MD;  Location: [place];  Service: Pulmonary;;   IR IMAGING GUIDED PORT INSERTION  08/05/2021   MELANOMA EXCISION  2008   Left side of neck   RADIOLOGY WITH ANESTHESIA N/A 04/05/2020   Procedure: MRI SPINE WITOUT CONTRAST;  Surgeon: Radiologist, Medication, MD;  Location: [place];  Service: Radiology;  Laterality: N/A;   RADIOLOGY WITH ANESTHESIA N/A 08/22/2021   Procedure: MRI BRAIN WITH AND WITHOUT CONTRAST  WITH ANESTHESIA;  Surgeon: Radiologist, Medication, MD;  Location: [place];  Service: Radiology;  Laterality: N/A;   RADIOLOGY WITH ANESTHESIA N/A 12/17/2021   Procedure: MRI BRAIN WITH AND  WITHOUT CONTRAST WITH ANESTHESIA; MRI LUMBER WITH AND WITHOUT CONTRAST;  Surgeon: Radiologist, Medication, MD;  Location: [place];  Service: Radiology;  Laterality: N/A;   TONSILLECTOMY     VIDEO BRONCHOSCOPY WITH ENDOBRONCHIAL ULTRASOUND N/A 06/17/2021   Procedure: VIDEO BRONCHOSCOPY WITH ENDOBRONCHIAL ULTRASOUND;  Surgeon: Candee Furbish, MD;  Location[facility] ENDOSCOPY;  Service: Pulmonary;  Laterality: N/A;    SOCIAL HISTORY: Social History   Socioeconomic History   Marital status: Married    Spouse name: Not on file   Number of children: Not on file   Years of education: Not on file   Highest education level: Not on file  Occupational History   Not on file  Tobacco Use   Smoking status: Former    Types: Cigarettes    Quit date: 07/21/1990    Years since quitting: 31.4   Smokeless tobacco: Never  Vaping Use   Vaping Use: Never used  Substance and Sexual Activity   Alcohol use: No   Drug use: No   Sexual activity: Not on file  Other Topics Concern   Not on file  Social History Narrative   Not on file   Social Determinants of Health   Financial Resource Strain: Low Risk  (11/21/2021)   Overall Financial Resource Strain (CARDIA)    Difficulty of Paying Living Expenses: Not hard at all  Food Insecurity: No Food Insecurity (11/19/2021)   Hunger Vital Sign    Worried About Running Out of Food in the Last Year: Never true    [place] in the Last Year: Never true  Transportation Needs: No Transportation Needs (11/21/2021)   PRAPARE - Hydrologist (Medical): No    Lack of Transportation (Non-Medical): No  Physical Activity: Inactive (07/31/2021)   Exercise Vital Sign    Days of Exercise per Week: 0 days    Minutes of Exercise per Session: 0 min  Stress: Unknown (07/31/2021)   [place]    Feeling of Stress : Patient refused  Social Connections: Unknown (07/31/2021)   Social  Connection and Isolation Panel [NHANES]    Frequency of Communication with Friends and Family: Three times a week    Frequency of Social Gatherings with Friends and Family: Three times a week    Attends Religious Services: Patient refused    Active Member of Clubs or Organizations: Patient refused    Attends Archivist Meetings: Patient refused    Marital Status: Married  Human resources officer Violence: Not At Risk (07/31/2021)   Humiliation, Afraid, Rape, and Kick questionnaire    Fear of Current or Ex-Partner: No    Emotionally Abused: No    Physically Abused: No    Sexually Abused: No    FAMILY HISTORY: Family History  Problem Relation Age of Onset   COPD Mother  Heart disease Father    Heart disease Brother        MI at age 93   Stroke Neg Hx     ALLERGIES:  is allergic to niaspan [niacin].  MEDICATIONS:  Current Outpatient Medications  Medication Sig Dispense Refill   dexamethasone (DECADRON) 4 MG tablet Take 1 tab every 12 hours the day before pemetrexed chemo, then take 2 tabs once a day for 3 days starting the day after carboplatin. 30 tablet 1   LORazepam (ATIVAN) 1 MG tablet Take 1 tablet (1 mg total) by mouth at bedtime as needed for sleep. 30 tablet 0   NOVOLOG FLEXPEN 100 UNIT/ML FlexPen Inject 0-10 Units into the skin daily as needed for high blood sugar (above 200). Sliding scale     omeprazole (PRILOSEC) 20 MG capsule Take 20 mg by mouth daily.     oxyCODONE-acetaminophen (PERCOCET) 7.5-325 MG tablet Take 1 tablet by mouth every 4 (four) hours as needed for severe pain. 60 tablet 0   potassium chloride (KLOR-CON M) 10 MEQ tablet TAKE 1 TABLET(10 MEQ) BY MOUTH DAILY (Patient taking differently: Take 10 mEq by mouth 2 (two) times a week.) 90 tablet 0   tamsulosin (FLOMAX) 0.4 MG CAPS capsule TAKE 1 CAPSULE(0.4 MG) BY MOUTH DAILY 90 capsule 0   UNABLE TO FIND PLEASE CHECK FASTING BLOOD GLUCOSE EVERY MORNING 1 each 0   atorvastatin (LIPITOR) 40 MG tablet TAKE 1  TABLET(40 MG) BY MOUTH DAILY (Patient not taking: Reported on 12/10/2021) 90 tablet 2   calcium-vitamin D (OSCAL WITH D) 500-5 MG-MCG tablet Take 2 tablets by mouth daily. (Patient not taking: Reported on 12/10/2021) 60 tablet 3   collagenase (SANTYL) 250 UNIT/GM ointment Apply 1 Application topically daily. (Patient not taking: Reported on 68/01/5725) 15 g 0   folic acid (FOLVITE) 1 MG tablet Take 1 tablet (1 mg total) by mouth daily. Start 7 days before pemetrexed chemotherapy. Continue until 21 days after pemetrexed completed. (Patient not taking: Reported on 12/10/2021) 100 tablet 3   insulin glargine (LANTUS) 100 UNIT/ML Solostar Pen Inject 25 Units into the skin daily. (Patient not taking: Reported on 12/23/2021) 15 mL 11   losartan (COZAAR) 50 MG tablet TAKE 1 TABLET(50 MG) BY MOUTH DAILY (Patient not taking: Reported on 12/13/2021) 30 tablet 0   pantoprazole (PROTONIX) 40 MG tablet Take 1 tablet (40 mg total) by mouth daily at 6 (six) AM. (Patient not taking: Reported on 12/13/2021) 90 tablet 0   No current facility-administered medications for this visit.   Facility-Administered Medications Ordered in Other Visits  Medication Dose Route Frequency Provider Last Rate Last Admin   heparin lock flush 100 UNIT/ML injection              PHYSICAL EXAMINATION:  Vitals:   12/23/21 1038  BP: (!) 147/74  Pulse: 74  Temp: (!) 96.5 F (35.8 C)  SpO2: 100%   Filed Weights   12/23/21 1038  Weight: 148 lb 8 oz (67.4 kg)    Physical Exam Constitutional:      General: He is not in acute distress.    Comments: Patient sits in the wheelchair  HENT:     Head: Normocephalic and atraumatic.  Eyes:     General: No scleral icterus. Cardiovascular:     Rate and Rhythm: Normal rate and regular rhythm.     Heart sounds: Normal heart sounds.  Pulmonary:     Effort: Pulmonary effort is normal. No respiratory distress.     Breath  sounds: No wheezing.     Comments: Decreased breath sound  bilaterally. Abdominal:     General: Bowel sounds are normal. There is no distension.     Palpations: Abdomen is soft.  Musculoskeletal:        General: No deformity. Normal range of motion.     Cervical back: Normal range of motion and neck supple.  Skin:    General: Skin is warm and dry.     Findings: No erythema or rash.  Neurological:     Mental Status: He is alert and oriented to person, place, and time. Mental status is at baseline.     Cranial Nerves: No cranial nerve deficit.     Coordination: Coordination normal.  Psychiatric:        Mood and Affect: Mood normal.     LABORATORY DATA:  I have reviewed the data as listed    Latest Ref Rng & Units 12/23/2021   10:22 AM 12/10/2021    2:20 PM 12/02/2021    1:05 PM  CBC  WBC 4.0 - 10.5 K/uL 10.2  2.2  7.5   Hemoglobin 13.0 - 17.0 g/dL 9.8  10.2  10.4   Hematocrit 39.0 - 52.0 % 30.3  31.3  32.1   Platelets 150 - 400 K/uL 368  182  428       Latest Ref Rng & Units 12/23/2021   10:22 AM 12/10/2021    2:20 PM 12/02/2021    1:05 PM  CMP  Glucose 70 - 99 mg/dL 290  225  159   BUN 8 - 23 mg/dL _0 Creatinine 0.61 - 1.24 mg/dL 0.96  0.89  0.76   Sodium 135 - 145 mmol/L 130  135  134   Potassium 3.5 - 5.1 mmol/L 3.9  5.0  3.8   Chloride 98 - 111 mmol/L 97  99  102   CO2 22 - 32 mmol/L _1 Calcium 8.9 - 10.3 mg/dL 8.7  8.7  8.6   Total Protein 6.5 - 8.1 g/dL 7.2   6.9   Total Bilirubin 0.3 - 1.2 mg/dL 0.2   0.4   Alkaline Phos 38 - 126 U/L 100   90   AST 15 - 41 U/L 31   21   ALT 0 - 44 U/L 19   15      Iron/TIBC/Ferritin/ %Sat    Component Value Date/Time   IRON 21 (L) 11/19/2021 0822   TIBC 246 (L) 11/19/2021 0822   FERRITIN 895 (H) 11/19/2021 0822   IRONPCTSAT 9 (L) 11/19/2021 5035      RADIOGRAPHIC STUDIES: I have personally reviewed the radiological images as listed and agreed with the findings in the report. MR Brain W Wo Contrast  Result Date: 12/18/2021 CLINICAL DATA:  Non-small  cell lung cancer follow up EXAM: MRI HEAD WITHOUT AND WITH CONTRAST TECHNIQUE: Multiplanar, multiecho pulse sequences of the brain and surrounding structures were obtained without and with intravenous contrast. CONTRAST:  24m GADAVIST GADOBUTROL 1 MMOL/ML IV SOLN COMPARISON:  08/22/21 FINDINGS: Brain: No acute hydrocephalus, extra-axial collection. Subcortical and periventricular T2/FLAIR hyperintense lesions are favored to represent sequela of chronic microvascular ischemic change. Redemonstrated are multifocal lobar distribution foci of susceptibility artifact with a new focus in the left parietal lobe (series 5, image 64) and an additional new focus in the peripheral left frontal lobe (series 5, image 58) and an additional in the peripheral right temporal lobe (series  5, image 40). Redemonstrated is a parenchymal hematoma in the left cerebellar hemisphere with interval decrease in blood products. The intrinsic T1 signal abnormality associated with this lesion now measures up to 1.1 x 1.1 cm, previously up to 2.9 x 2.5 cm. - previously seen intrinsically T1 hyperintenense lesion in the cerebellar vermis no longer demonstrates T1 signal abnormality and is without associated contrast enhanacement. - there is a persistent contrast-enhancing lesion in the posterior left frontal lobe (series 2000, image 199), unchanged from prior measuring up to 2 mm. This lesion does not demonstrate intrinsic T1 hyperintense signal abnormality or susceptibility artiafct. There may be an additional contrast enhancing lesion in the left occipital lobe (series 2000, image 182). These lesions have a somewhat linear appearance and are positioned along margins of small vessels. - there is a persistent intrinsically T1 hyperintense lesion in the left frontal lobe measuring 4 mm, unchanged from prior (series 2000, image 207 / series 600, image 207) with associated susceptibility artifact. -there is new 2 mm intrinsically T1 hyperintense lesion  in the peripheral left frontal lobe, which correlates with a site of susceptibility artifact (series 2000, 204). -there is a new 3 mm intrinsically T1 hyperintense lesion in the peripheral right temporal lobe (series 2000, image 169), which correlates with a site of susceptibility artifact Vascular: Normal flow voids. Skull and upper cervical spine: There is diffusely heterogeneous marrow signal throughout the cervical spine. There is a soft tissue lipoma along left frontal scalp. Interval decrease in size of the right parietal calvarial metastatic lesion (series 2000, image 219). Sinuses/Orbits: Polypoid mucosal thickening bilateral maxillary sinuses. Other: On IMPRESSION: 1. Interval decrease in size of the left cerebellar and vermian hematomas. No definite evidence of underlying metastatic lesion. 2. There is are two possible contrast enhancing lesions in the posterior left frontal lobe and left occipital lobe, which do not have intrinsic T1 signal abnormality, but have a somewhat linear appearance and may be vascular in nature. Recommend attention on follow up. 3. Multifocal sites of susceptibility artifact are redemonstrated with interval development of a few new foci, as described above. All of these sites demonstrate intrinsic T1 hyperintense signal abnormality and susceptibility artifact and are compatible with sites of microhemorrhages. Recommend continued attention on follow up. 4. Interval decrease in size of right parietal calvarial metastatic lesion. No new contrast enhancing lesions visualized. 5. Diffusely heterogeneous marrow signal throughout the cervical spine, which is nonspecific but can be seen in the setting of anemia, smoking, obesity, or a marrow replacement process. Electronically Signed   By: Marin Roberts M.D.   On: 12/18/2021 16:38   MR Lumbar Spine W Wo Contrast  Result Date: 12/18/2021 CLINICAL DATA:  Non-small cell lung cancer. EXAM: MRI LUMBAR SPINE WITHOUT AND WITH CONTRAST  TECHNIQUE: Multiplanar and multiecho pulse sequences of the lumbar spine were obtained without and with intravenous contrast. CONTRAST:  48m GADAVIST GADOBUTROL 1 MMOL/ML IV SOLN COMPARISON:  PET scan 10/29/2021.  Lumbar MRI 04/05/2020. FINDINGS: Segmentation:  5 lumbar type vertebral bodies. Alignment:  Straightening of the normal lumbar lordosis. Vertebrae: Late subacute superior endplate fracture at L2, not changed from PET scan of 10/29/2021. Large superior endplate Schmorl's node at L5, not changed since the prior PET scan. These look like benign entities. There is no evidence of regional metastatic disease. Conus medullaris and cauda equina: Conus extends to the L1 level. Conus and cauda equina appear normal. Paraspinal and other soft tissues: Continued evidence of enteritis of at least 1 loop of small bowel  seen on the sagittal imaging. Distended bladder. Disc levels: T12-L1: Normal L1-2: Mild bulging of the disc.  No compressive stenosis. L2-3: Mild bulging of the disc.  No compressive stenosis. L3-4: Shallow disc protrusion. Mild facet and ligamentous hypertrophy. Stenosis of both lateral recesses. Findings are slightly worsened compared to the study of February 2022. L4-5: Shallow protrusion of the disc with annular fissures. Facet and ligamentous hypertrophy. Small synovial cyst arising from the facet joint on the right. Stenosis of the lateral recesses that could be symptomatic, worse on the right than the left. Foraminal narrowing right worse than left. Findings have worsened since the study of 2022. L5-S1: Endplate osteophytes and shallow protrusion of the disc. Facet and ligamentous hypertrophy. Stenosis of the subarticular lateral recesses and neural foramina, right worse than left. Similar appearance to the study of 2022. IMPRESSION: 1. No evidence of regional metastatic disease. 2. Late subacute superior endplate fracture at L2 and large superior endplate Schmorl's node at L5, unchanged since the  PET scan of 10/29/2021. 3. L3-4: Shallow disc protrusion. Facet and ligamentous hypertrophy. Stenosis of both lateral recesses. Findings slightly worsened since 2022. 4. L4-5: Shallow disc protrusion. Facet and ligamentous hypertrophy. Small synovial cyst arising from the facet joint on the right. Stenosis of the lateral recesses right worse than left. Foraminal narrowing right worse than left. Findings have worsened since 2022. 5. L5-S1: Endplate osteophytes and shallow protrusion of the disc. Facet and ligamentous hypertrophy. Stenosis of the subarticular lateral recesses and neural foramina, right worse than left. Similar appearance to the study of 2022. 6. Continued evidence of enteritis of at least 1 loop of small bowel. Distended bladder present. Electronically Signed   By: Nelson Chimes M.D.   On: 12/18/2021 16:09

## 2021-12-23 NOTE — Progress Notes (Signed)
Pt states about every 2 hours he has to go to restroom even with taking the ATIVAN--at times able to go back to sleep but most of the time isn't able to go back. Pt will like to discuss when he is able to see a back doctor.

## 2021-12-23 NOTE — Assessment & Plan Note (Signed)
Chemotherapy plan as listed above 

## 2021-12-23 NOTE — Assessment & Plan Note (Addendum)
MRI brain shows persistent microbleeds.   Stop  Eliquis 2.5mg  BID.

## 2021-12-23 NOTE — Progress Notes (Signed)
Radiation Oncology Follow up Note  Name: Micheal Donovan   Date:   12/23/2021 MRN:  [id number] DOB: 10/30/1950    This 71 y.o. male presents to the clinic today for possible further palliative radiation therapy to his lumbar spine and patient previously treated to his whole brain.  Patient has had palliative radiation therapy to his L-spine SI joints.  Patient has history of stage IV adenocarcinoma the left lung previously treated with radiation and concurrent chemotherapy  REFERRING PROVIDER: [name], MD  HPI: Patient is a 71 year old male with stage IV.  Adenocarcinoma the lung.  Has been treated with concurrent chemoradiation to his left lung prior as well as palliative radiation therapy to his L-spine and SI joints.  He underwent MRI of his brain recently showing interval decrease in size of left-sided cerebellar and vermian hematomas.  No evidence of underlying metastatic disease is noted.  MRI scan is also was recently performed showing no evidence of regional metastatic disease he does have endplate fracture of L2 with disc protrusion at L3-4.  He continues to have back pain although this is unrelated to metastatic disease.  COMPLICATIONS OF TREATMENT: none  FOLLOW UP COMPLIANCE: keeps appointments   PHYSICAL EXAM:  BP (!) 143/80 (BP Location: Left Arm, Patient Position: Sitting, Cuff Size: Normal)   Pulse 83   Temp 98 F (36.7 C) (Tympanic)   Resp 18   Ht 5\' 7"  (1.702 m)   Wt 147 lb (66.7 kg)   BMI 23.02 kg/m  Well-developed well-nourished patient in NAD. HEENT reveals PERLA, EOMI, discs not visualized.  Oral cavity is clear. No oral mucosal lesions are identified. Neck is clear without evidence of cervical or supraclavicular adenopathy. Lungs are clear to A&P. Cardiac examination is essentially unremarkable with regular rate and rhythm without murmur rub or thrill. Abdomen is benign with no organomegaly or masses noted. Motor sensory and DTR levels are equal and symmetric  in the upper and lower extremities. Cranial nerves II through XII are grossly intact. Proprioception is intact. No peripheral adenopathy or edema is identified. No motor or sensory levels are noted. Crude visual fields are within normal range.  RADIOLOGY RESULTS: MRI scans of brain and lumbar spine reviewed compatible with above-stated findings  PLAN: At this time I see no benefit to any more palliative radiation therapy at this time.  He does not have metastatic disease in his lumbar spine.  I have asked to see him back in 6 months for follow-up.  Should he need arise for further palliative treatment be happy to reevaluate him again.  I would like to take this opportunity to thank you for allowing me to participate in the care of your patient.Noreene Filbert, MD

## 2021-12-23 NOTE — Assessment & Plan Note (Signed)
Recommend Zometa every 4- 6 weeks. Recommend calcium supplementation.

## 2021-12-23 NOTE — Assessment & Plan Note (Signed)
continue  B12 injection every 6 weeks.

## 2021-12-31 ENCOUNTER — Other Ambulatory Visit: Payer: Self-pay | Admitting: Oncology

## 2021-12-31 MED ORDER — OXYCODONE-ACETAMINOPHEN 7.5-325 MG PO TABS: 1.0000 | ORAL_TABLET | ORAL | 0 refills | Status: DC | PRN

## 2022-01-01 ENCOUNTER — Ambulatory Visit (INDEPENDENT_AMBULATORY_CARE_PROVIDER_SITE_OTHER): Payer: PPO | Admitting: Family Medicine

## 2022-01-01 VITALS — BP 120/72 | HR 92 | Ht 67.0 in | Wt 142.6 lb

## 2022-01-01 DIAGNOSIS — Z23 Encounter for immunization: Secondary | ICD-10-CM

## 2022-01-01 DIAGNOSIS — M47816 Spondylosis without myelopathy or radiculopathy, lumbar region: Secondary | ICD-10-CM

## 2022-01-01 DIAGNOSIS — S32029A Unspecified fracture of second lumbar vertebra, initial encounter for closed fracture: Secondary | ICD-10-CM

## 2022-01-01 DIAGNOSIS — G8929 Other chronic pain: Secondary | ICD-10-CM | POA: Diagnosis not present

## 2022-01-01 DIAGNOSIS — M545 Low back pain, unspecified: Secondary | ICD-10-CM | POA: Diagnosis not present

## 2022-01-01 MED ORDER — PREDNISONE 20 MG PO TABS: ORAL_TABLET | ORAL | 0 refills | Status: DC

## 2022-01-01 NOTE — Progress Notes (Signed)
Subjective:    Patient ID: Zada Finders, male    DOB: [birth year], 71 y.o.   MRN: [id number]  Back Pain   Patient is a 71 year old Caucasian gentleman with a history of metastatic lung cancer who recently had an MRI on his lower back: IMPRESSION: 1. No evidence of regional metastatic disease. 2. Late subacute superior endplate fracture at L2 and large superior endplate Schmorl's node at L5, unchanged since the PET scan of 10/29/2021. 3. L3-4: Shallow disc protrusion. Facet and ligamentous hypertrophy. Stenosis of both lateral recesses. Findings slightly worsened since 2022. 4. L4-5: Shallow disc protrusion. Facet and ligamentous hypertrophy. Small synovial cyst arising from the facet joint on the right. Stenosis of the lateral recesses right worse than left. Foraminal narrowing right worse than left. Findings have worsened since 2022. 5. L5-S1: Endplate osteophytes and shallow protrusion of the disc. Facet and ligamentous hypertrophy. Stenosis of the subarticular lateral recesses and neural foramina, right worse than left. Similar appearance to the study of 2022. 6. Continued evidence of enteritis of at least 1 loop of small bowel. Distended bladder present.  He reports severe pain in his back.  When I ask him to point to the area where he feels the most pain, he places his hand in the center around the level of L5.  He denies any numbness or tingling or pain radiating down his legs.  He states that he had similar pain last year and received a cortisone injection in his back under the care of Dr. Tonita Cong.  The pain improved.  He is no longer experiencing lumbar radiculopathy but he is having constant severe pain in the center of his back.  On his MRI he has 2 areas that could be the biggest source of pain.  He has a subacute fracture at L2 and he also has an osteophyte at L5 both of which could potentially be causing his pain.  He denies any bowel or bladder incontinence.  He denies any  saddle anesthesia.  He is requesting a referral to a specialist to treat his back pain Past Medical History:  Diagnosis Date   Allergy    BPH (benign prostatic hypertrophy)    Cancer ([place])    Melanoma on Neck    2008   Carotid artery occlusion    Diabetes mellitus    type 2   ED (erectile dysfunction)    GERD (gastroesophageal reflux disease)    Hemorrhagic stroke ([place]) 06/2021   Hyperlipidemia    Hypertension    Lung cancer ([place])    Retinopathy due to secondary DM [facility])    Past Surgical History:  Procedure Laterality Date   BRONCHIAL NEEDLE ASPIRATION BIOPSY  06/17/2021   Procedure: BRONCHIAL NEEDLE ASPIRATION BIOPSIES;  Surgeon: Candee Furbish, MD;  Location: [facility] ENDOSCOPY;  Service: Pulmonary;;   IR IMAGING GUIDED PORT INSERTION  08/05/2021   MELANOMA EXCISION  2008   Left side of neck   RADIOLOGY WITH ANESTHESIA N/A 04/05/2020   Procedure: MRI SPINE WITOUT CONTRAST;  Surgeon: Radiologist, Medication, MD;  Location: [place];  Service: Radiology;  Laterality: N/A;   RADIOLOGY WITH ANESTHESIA N/A 08/22/2021   Procedure: MRI BRAIN WITH AND WITHOUT CONTRAST  WITH ANESTHESIA;  Surgeon: Radiologist, Medication, MD;  Location: [place];  Service: Radiology;  Laterality: N/A;   RADIOLOGY WITH ANESTHESIA N/A 12/17/2021   Procedure: MRI BRAIN WITH AND WITHOUT CONTRAST WITH ANESTHESIA; MRI LUMBER WITH AND WITHOUT CONTRAST;  Surgeon: Radiologist, Medication, MD;  Location: [place];  Service: Radiology;  Laterality: N/A;   TONSILLECTOMY     VIDEO BRONCHOSCOPY WITH ENDOBRONCHIAL ULTRASOUND N/A 06/17/2021   Procedure: VIDEO BRONCHOSCOPY WITH ENDOBRONCHIAL ULTRASOUND;  Surgeon: Candee Furbish, MD;  Location[facility] ENDOSCOPY;  Service: Pulmonary;  Laterality: N/A;   Current Outpatient Medications on File Prior to Visit  Medication Sig Dispense Refill   dexamethasone (DECADRON) 4 MG tablet Take 1 tab every 12 hours the day before pemetrexed chemo, then take 2 tabs once a day for 3 days starting the day after  carboplatin. 30 tablet 1   LORazepam (ATIVAN) 1 MG tablet Take 1 tablet (1 mg total) by mouth at bedtime as needed for sleep. 30 tablet 0   NOVOLOG FLEXPEN 100 UNIT/ML FlexPen Inject 0-10 Units into the skin daily as needed for high blood sugar (above 200). Sliding scale     omeprazole (PRILOSEC) 20 MG capsule Take 20 mg by mouth daily.     oxyCODONE-acetaminophen (PERCOCET) 7.5-325 MG tablet Take 1 tablet by mouth every 4 (four) hours as needed for severe pain. 60 tablet 0   tamsulosin (FLOMAX) 0.4 MG CAPS capsule TAKE 1 CAPSULE(0.4 MG) BY MOUTH DAILY 90 capsule 0   UNABLE TO FIND PLEASE CHECK FASTING BLOOD GLUCOSE EVERY MORNING 1 each 0   [DISCONTINUED] prochlorperazine (COMPAZINE) 10 MG tablet Take 1 tablet (10 mg total) by mouth every 6 (six) hours as needed (Nausea or vomiting). 30 tablet 1   Current Facility-Administered Medications on File Prior to Visit  Medication Dose Route Frequency Provider Last Rate Last Admin   heparin lock flush 100 UNIT/ML injection              Allergies  Allergen Reactions   Niaspan [Niacin]     FLUSHING   Social History   Socioeconomic History   Marital status: Married    Spouse name: Not on file   Number of children: Not on file   Years of education: Not on file   Highest education level: Not on file  Occupational History   Not on file  Tobacco Use   Smoking status: Former    Types: Cigarettes    Quit date: 07/21/1990    Years since quitting: 31.4   Smokeless tobacco: Never  Vaping Use   Vaping Use: Never used  Substance and Sexual Activity   Alcohol use: No   Drug use: No   Sexual activity: Not on file  Other Topics Concern   Not on file  Social History Narrative   Not on file   Social Determinants of Health   Financial Resource Strain: Low Risk  (11/21/2021)   Overall Financial Resource Strain (CARDIA)    Difficulty of Paying Living Expenses: Not hard at all  Food Insecurity: No Food Insecurity (11/19/2021)   Hunger Vital Sign     Worried About Running Out of Food in the Last Year: Never true    [place] in the Last Year: Never true  Transportation Needs: No Transportation Needs (11/21/2021)   PRAPARE - Hydrologist (Medical): No    Lack of Transportation (Non-Medical): No  Physical Activity: Inactive (07/31/2021)   Exercise Vital Sign    Days of Exercise per Week: 0 days    Minutes of Exercise per Session: 0 min  Stress: Unknown (07/31/2021)   [place]    Feeling of Stress : Patient refused  Social Connections: Unknown (07/31/2021)   Social Connection and Isolation  Panel [NHANES]    Frequency of Communication with Friends and Family: Three times a week    Frequency of Social Gatherings with Friends and Family: Three times a week    Attends Religious Services: Patient refused    Active Member of Clubs or Organizations: Patient refused    Attends Archivist Meetings: Patient refused    Marital Status: Married  Human resources officer Violence: Not At Risk (07/31/2021)   Humiliation, Afraid, Rape, and Kick questionnaire    Fear of Current or Ex-Partner: No    Emotionally Abused: No    Physically Abused: No    Sexually Abused: No    Past Medical History:  Diagnosis Date   Allergy    BPH (benign prostatic hypertrophy)    Cancer ([place])    Melanoma on Neck    2008   Carotid artery occlusion    Diabetes mellitus    type 2   ED (erectile dysfunction)    GERD (gastroesophageal reflux disease)    Hemorrhagic stroke ([place]) 06/2021   Hyperlipidemia    Hypertension    Lung cancer ([place])    Retinopathy due to secondary DM [facility])    Past Surgical History:  Procedure Laterality Date   BRONCHIAL NEEDLE ASPIRATION BIOPSY  06/17/2021   Procedure: BRONCHIAL NEEDLE ASPIRATION BIOPSIES;  Surgeon: Candee Furbish, MD;  Location: [place];  Service: Pulmonary;;   IR IMAGING GUIDED PORT INSERTION  08/05/2021    MELANOMA EXCISION  2008   Left side of neck   RADIOLOGY WITH ANESTHESIA N/A 04/05/2020   Procedure: MRI SPINE WITOUT CONTRAST;  Surgeon: Radiologist, Medication, MD;  Location: [place];  Service: Radiology;  Laterality: N/A;   RADIOLOGY WITH ANESTHESIA N/A 08/22/2021   Procedure: MRI BRAIN WITH AND WITHOUT CONTRAST  WITH ANESTHESIA;  Surgeon: Radiologist, Medication, MD;  Location: [place];  Service: Radiology;  Laterality: N/A;   RADIOLOGY WITH ANESTHESIA N/A 12/17/2021   Procedure: MRI BRAIN WITH AND WITHOUT CONTRAST WITH ANESTHESIA; MRI LUMBER WITH AND WITHOUT CONTRAST;  Surgeon: Radiologist, Medication, MD;  Location: [place];  Service: Radiology;  Laterality: N/A;   TONSILLECTOMY     VIDEO BRONCHOSCOPY WITH ENDOBRONCHIAL ULTRASOUND N/A 06/17/2021   Procedure: VIDEO BRONCHOSCOPY WITH ENDOBRONCHIAL ULTRASOUND;  Surgeon: Candee Furbish, MD;  Location: [facility] ENDOSCOPY;  Service: Pulmonary;  Laterality: N/A;   Current Outpatient Medications on File Prior to Visit  Medication Sig Dispense Refill   dexamethasone (DECADRON) 4 MG tablet Take 1 tab every 12 hours the day before pemetrexed chemo, then take 2 tabs once a day for 3 days starting the day after carboplatin. 30 tablet 1   LORazepam (ATIVAN) 1 MG tablet Take 1 tablet (1 mg total) by mouth at bedtime as needed for sleep. 30 tablet 0   NOVOLOG FLEXPEN 100 UNIT/ML FlexPen Inject 0-10 Units into the skin daily as needed for high blood sugar (above 200). Sliding scale     omeprazole (PRILOSEC) 20 MG capsule Take 20 mg by mouth daily.     oxyCODONE-acetaminophen (PERCOCET) 7.5-325 MG tablet Take 1 tablet by mouth every 4 (four) hours as needed for severe pain. 60 tablet 0   tamsulosin (FLOMAX) 0.4 MG CAPS capsule TAKE 1 CAPSULE(0.4 MG) BY MOUTH DAILY 90 capsule 0   UNABLE TO FIND PLEASE CHECK FASTING BLOOD GLUCOSE EVERY MORNING 1 each 0   [DISCONTINUED] prochlorperazine (COMPAZINE) 10 MG tablet Take 1 tablet (10 mg total) by mouth every 6 (six) hours as  needed (Nausea or vomiting). 30 tablet  1   Current Facility-Administered Medications on File Prior to Visit  Medication Dose Route Frequency Provider Last Rate Last Admin   heparin lock flush 100 UNIT/ML injection            Allergies  Allergen Reactions   Niaspan [Niacin]     FLUSHING   Social History   Socioeconomic History   Marital status: Married    Spouse name: Not on file   Number of children: Not on file   Years of education: Not on file   Highest education level: Not on file  Occupational History   Not on file  Tobacco Use   Smoking status: Former    Types: Cigarettes    Quit date: 07/21/1990    Years since quitting: 31.4   Smokeless tobacco: Never  Vaping Use   Vaping Use: Never used  Substance and Sexual Activity   Alcohol use: No   Drug use: No   Sexual activity: Not on file  Other Topics Concern   Not on file  Social History Narrative   Not on file   Social Determinants of Health   Financial Resource Strain: Low Risk  (11/21/2021)   Overall Financial Resource Strain (CARDIA)    Difficulty of Paying Living Expenses: Not hard at all  Food Insecurity: No Food Insecurity (11/19/2021)   Hunger Vital Sign    Worried About Running Out of Food in the Last Year: Never true    [place] in the Last Year: Never true  Transportation Needs: No Transportation Needs (11/21/2021)   PRAPARE - Hydrologist (Medical): No    Lack of Transportation (Non-Medical): No  Physical Activity: Inactive (07/31/2021)   Exercise Vital Sign    Days of Exercise per Week: 0 days    Minutes of Exercise per Session: 0 min  Stress: Unknown (07/31/2021)   [place]    Feeling of Stress : Patient refused  Social Connections: Unknown (07/31/2021)   Social Connection and Isolation Panel [NHANES]    Frequency of Communication with Friends and Family: Three times a week    Frequency of Social  Gatherings with Friends and Family: Three times a week    Attends Religious Services: Patient refused    Active Member of Clubs or Organizations: Patient refused    Attends Archivist Meetings: Patient refused    Marital Status: Married  Human resources officer Violence: Not At Risk (07/31/2021)   Humiliation, Afraid, Rape, and Kick questionnaire    Fear of Current or Ex-Partner: No    Emotionally Abused: No    Physically Abused: No    Sexually Abused: No      Review of Systems  Musculoskeletal:  Positive for back pain.  All other systems reviewed and are negative.      Objective:   Physical Exam Vitals reviewed.  Constitutional:      General: He is not in acute distress.    Appearance: He is ill-appearing.  HENT:     Mouth/Throat:     Mouth: Mucous membranes are dry.  Cardiovascular:     Rate and Rhythm: Normal rate and regular rhythm.     Heart sounds: Normal heart sounds. No murmur heard.    No friction rub. No gallop.  Pulmonary:     Effort: Pulmonary effort is normal. No respiratory distress.     Breath sounds: Normal breath sounds. No stridor. No wheezing, rhonchi or rales.  Neurological:     General: No focal deficit present.     Mental Status: He is alert and oriented to person, place, and time.     Cranial Nerves: No cranial nerve deficit.     Sensory: No sensory deficit.     Deep Tendon Reflexes: Reflexes normal.           Assessment & Plan:  Flu vaccine need  Chronic midline low back pain, unspecified whether sciatica present  Closed fracture of second lumbar vertebra, unspecified fracture morphology, initial encounter ([place])  Spondylosis of lumbar spine Based on his MRI, I am not certain of whether a cortisone injection at L5 would benefit the patient nor whether he would benefit more from kyphoplasty of L2.  I given the patient a prednisone taper pack and affect to mimic a cortisone injection in his lower back.  If he sees significant benefit  from the prednisone taper pack, I will refer the patient to interventional radiology for an epidural steroid injection at L5.  If he does not see significant benefit, I would recommend seeing neurosurgery to discuss vertebroplasty at L2

## 2022-01-03 ENCOUNTER — Other Ambulatory Visit: Payer: Self-pay | Admitting: Family Medicine

## 2022-01-06 ENCOUNTER — Other Ambulatory Visit: Payer: Self-pay | Admitting: Family Medicine

## 2022-01-06 NOTE — Telephone Encounter (Signed)
Unable to refill per protocol, Rx expired.Medication was discontinued 01/01/22, will refuse.  Requested Prescriptions  Pending Prescriptions Disp Refills   losartan (COZAAR) 50 MG tablet [Pharmacy Med Name: LOSARTAN 50MG  TABLETS] 90 tablet     Sig: TAKE 1 TABLET(50 MG) BY MOUTH DAILY     Cardiovascular:  Angiotensin Receptor Blockers Failed - 01/06/2022  3:42 AM      Failed - Valid encounter within last 6 months    Recent Outpatient Visits           6 months ago Uncontrolled type 2 diabetes mellitus with hypoglycemia, unspecified hypoglycemia coma status ([place])   [place] Pickard, Cammie Mcgee, MD   7 months ago Uncontrolled type 2 diabetes mellitus with hypoglycemia, unspecified hypoglycemia coma status ([place])   [place] Pickard, Cammie Mcgee, MD   3 years ago Pruritus   [place] D[place] Schaumann, Cammie Mcgee, MD   4 years ago Encounter for hepatitis C screening test for low risk patient   [place] [name], MD   4 years ago Type 2 diabetes mellitus without complication, with long-term current use of insulin ([place])   [place], Mod[place] Nunnery, MD              Passed - Cr in normal range and within 180 days    Creat  Date Value Ref Range Status  12/10/2021 0.89 0.70 - 1.28 mg/dL Final   Creatinine, Ser  Date Value Ref Range Status  12/23/2021 0.96 0.61 - 1.24 mg/dL Final   Creatinine, Urine  Date Value Ref Range Status  03/06/2016 162 20 - 370 mg/dL Final         Passed - K in normal range and within 180 days    Potassium  Date Value Ref Range Status  12/23/2021 3.9 3.5 - 5.1 mmol/L Final         Passed - Patient is not pregnant      Passed - Last BP in normal range    BP Readings from Last 1 Encounters:  01/01/22 120/72

## 2022-01-06 NOTE — Telephone Encounter (Signed)
Requested medication (s) are due for refill today:   Provider to review  Requested medication (s) are on the active medication list:   Yes  Future visit scheduled:   No   Last ordered: 12/10/2021 #30, 0 refills  Returned because it's a non delegated refill   Requested Prescriptions  Pending Prescriptions Disp Refills   LORazepam (ATIVAN) 1 MG tablet [Pharmacy Med Name: LORAZEPAM 1MG  TABLETS] 30 tablet     Sig: TAKE 1 TABLET(1 MG) BY MOUTH AT BEDTIME AS NEEDED FOR SLEEP     Not Delegated - Psychiatry: Anxiolytics/Hypnotics 2 Failed - 01/03/2022  7:35 PM      Failed - This refill cannot be delegated      Failed - Valid encounter within last 6 months    Recent Outpatient Visits           6 months ago Uncontrolled type 2 diabetes mellitus with hypoglycemia, unspecified hypoglycemia coma status ([place])   [place] Pickard, Cammie Mcgee, MD   7 months ago Uncontrolled type 2 diabetes mellitus with hypoglycemia, unspecified hypoglycemia coma status ([place])   [place] Pickard, Cammie Mcgee, MD   3 years ago Pruritus   [place] Dennard Schaumann, Cammie Mcgee, MD   4 years ago Encounter for hepatitis C screening test for low risk patient   [place] [name], MD   4 years ago Type 2 diabetes mellitus without complication, with long-term current use of insulin ([place])   [place], [place] Nunnery, MD              Passed - Urine Drug Screen completed in last 360 days      Passed - Patient is not pregnant

## 2022-01-09 DIAGNOSIS — X32XXXD Exposure to sunlight, subsequent encounter: Secondary | ICD-10-CM | POA: Diagnosis not present

## 2022-01-09 DIAGNOSIS — L570 Actinic keratosis: Secondary | ICD-10-CM | POA: Diagnosis not present

## 2022-01-09 DIAGNOSIS — L820 Inflamed seborrheic keratosis: Secondary | ICD-10-CM | POA: Diagnosis not present

## 2022-01-09 DIAGNOSIS — D225 Melanocytic nevi of trunk: Secondary | ICD-10-CM | POA: Diagnosis not present

## 2022-01-09 DIAGNOSIS — R208 Other disturbances of skin sensation: Secondary | ICD-10-CM | POA: Diagnosis not present

## 2022-01-09 DIAGNOSIS — D485 Neoplasm of uncertain behavior of skin: Secondary | ICD-10-CM | POA: Diagnosis not present

## 2022-01-13 ENCOUNTER — Inpatient Hospital Stay (HOSPITAL_BASED_OUTPATIENT_CLINIC_OR_DEPARTMENT_OTHER): Payer: PPO | Admitting: Oncology

## 2022-01-13 ENCOUNTER — Inpatient Hospital Stay: Payer: PPO

## 2022-01-13 ENCOUNTER — Encounter: Payer: Self-pay | Admitting: Oncology

## 2022-01-13 ENCOUNTER — Inpatient Hospital Stay: Payer: PPO | Attending: Radiation Oncology

## 2022-01-13 VITALS — BP 163/80 | HR 66 | Temp 97.2°F | Resp 18 | Wt 147.3 lb

## 2022-01-13 DIAGNOSIS — E876 Hypokalemia: Secondary | ICD-10-CM | POA: Insufficient documentation

## 2022-01-13 DIAGNOSIS — G893 Neoplasm related pain (acute) (chronic): Secondary | ICD-10-CM | POA: Diagnosis not present

## 2022-01-13 DIAGNOSIS — Z5111 Encounter for antineoplastic chemotherapy: Secondary | ICD-10-CM | POA: Insufficient documentation

## 2022-01-13 DIAGNOSIS — I2699 Other pulmonary embolism without acute cor pulmonale: Secondary | ICD-10-CM

## 2022-01-13 DIAGNOSIS — C3412 Malignant neoplasm of upper lobe, left bronchus or lung: Secondary | ICD-10-CM | POA: Diagnosis not present

## 2022-01-13 DIAGNOSIS — D518 Other vitamin B12 deficiency anemias: Secondary | ICD-10-CM

## 2022-01-13 DIAGNOSIS — D6481 Anemia due to antineoplastic chemotherapy: Secondary | ICD-10-CM

## 2022-01-13 DIAGNOSIS — C7951 Secondary malignant neoplasm of bone: Secondary | ICD-10-CM | POA: Diagnosis not present

## 2022-01-13 DIAGNOSIS — T451X5A Adverse effect of antineoplastic and immunosuppressive drugs, initial encounter: Secondary | ICD-10-CM

## 2022-01-13 DIAGNOSIS — D519 Vitamin B12 deficiency anemia, unspecified: Secondary | ICD-10-CM | POA: Insufficient documentation

## 2022-01-13 DIAGNOSIS — C7931 Secondary malignant neoplasm of brain: Secondary | ICD-10-CM | POA: Insufficient documentation

## 2022-01-13 LAB — COMPREHENSIVE METABOLIC PANEL
ALT: 38 U/L (ref 0–44)
AST: 45 U/L — ABNORMAL HIGH (ref 15–41)
Albumin: 3.3 g/dL — ABNORMAL LOW (ref 3.5–5.0)
Alkaline Phosphatase: 106 U/L (ref 38–126)
Anion gap: 10 (ref 5–15)
BUN: 19 mg/dL (ref 8–23)
CO2: 24 mmol/L (ref 22–32)
Calcium: 8.7 mg/dL — ABNORMAL LOW (ref 8.9–10.3)
Chloride: 98 mmol/L (ref 98–111)
Creatinine, Ser: 1 mg/dL (ref 0.61–1.24)
GFR, Estimated: >60 mL/min (ref >60)
Glucose, Bld: 128 mg/dL — ABNORMAL HIGH (ref 70–99)
Potassium: 3.9 mmol/L (ref 3.5–5.1)
Sodium: 132 mmol/L — ABNORMAL LOW (ref 135–145)
Total Bilirubin: 0.3 mg/dL (ref 0.3–1.2)
Total Protein: 6.8 g/dL (ref 6.5–8.1)

## 2022-01-13 LAB — CBC WITH DIFFERENTIAL/PLATELET
Abs Immature Granulocytes: 0.32 10*3/uL — ABNORMAL HIGH (ref 0.00–0.07)
Basophils Absolute: 0 10*3/uL (ref 0.0–0.1)
Basophils Relative: 0 %
Eosinophils Absolute: 0 10*3/uL (ref 0.0–0.5)
Eosinophils Relative: 0 %
HCT: 29.7 % — ABNORMAL LOW (ref 39.0–52.0)
Hemoglobin: 9.4 g/dL — ABNORMAL LOW (ref 13.0–17.0)
Immature Granulocytes: 4 %
Lymphocytes Relative: 7 %
Lymphs Abs: 0.5 10*3/uL — ABNORMAL LOW (ref 0.7–4.0)
MCH: 30.9 pg (ref 26.0–34.0)
MCHC: 31.6 g/dL (ref 30.0–36.0)
MCV: 97.7 fL (ref 80.0–100.0)
Monocytes Absolute: 0.9 10*3/uL (ref 0.1–1.0)
Monocytes Relative: 11 %
Neutro Abs: 5.9 10*3/uL (ref 1.7–7.7)
Neutrophils Relative %: 78 %
Platelets: 382 10*3/uL (ref 150–400)
RBC: 3.04 MIL/uL — ABNORMAL LOW (ref 4.22–5.81)
RDW: 18.8 % — ABNORMAL HIGH (ref 11.5–15.5)
WBC: 7.6 10*3/uL (ref 4.0–10.5)
nRBC: 0 % (ref 0.0–0.2)

## 2022-01-13 LAB — VITAMIN B12: Vitamin B-12: 290 pg/mL (ref 180–914)

## 2022-01-13 MED ORDER — SODIUM CHLORIDE 0.9 % IV SOLN
350.0000 mg/m2 | Freq: Once | INTRAVENOUS | Status: AC
  Administered 2022-01-13: 600 mg via INTRAVENOUS
  Filled 2022-01-13: qty 20

## 2022-01-13 MED ORDER — HEPARIN SOD (PORK) LOCK FLUSH 100 UNIT/ML IV SOLN
INTRAVENOUS | Status: AC
  Administered 2022-01-13: 500 [IU]
  Filled 2022-01-13: qty 5

## 2022-01-13 MED ORDER — PROCHLORPERAZINE MALEATE 10 MG PO TABS
10.0000 mg | ORAL_TABLET | Freq: Once | ORAL | Status: AC
  Administered 2022-01-13: 10 mg via ORAL
  Filled 2022-01-13: qty 1

## 2022-01-13 MED ORDER — HEPARIN SOD (PORK) LOCK FLUSH 100 UNIT/ML IV SOLN
500.0000 [IU] | Freq: Once | INTRAVENOUS | Status: AC | PRN
  Filled 2022-01-13: qty 5

## 2022-01-13 MED ORDER — SODIUM CHLORIDE 0.9 % IV SOLN
Freq: Once | INTRAVENOUS | Status: AC
  Filled 2022-01-13: qty 250

## 2022-01-13 MED ORDER — CYANOCOBALAMIN 1000 MCG/ML IJ SOLN
1000.0000 ug | Freq: Once | INTRAMUSCULAR | Status: AC
  Administered 2022-01-13: 1000 ug via INTRAMUSCULAR
  Filled 2022-01-13: qty 1

## 2022-01-13 NOTE — Assessment & Plan Note (Signed)
MRI brain shows persistent microbleeds.   Stop  Eliquis 2.5mg  BID.

## 2022-01-13 NOTE — Assessment & Plan Note (Signed)
Back pain, MRI lumbar showed compression fracture Continue PRN percocet Recommend patient to avoid heavy lifting

## 2022-01-13 NOTE — Assessment & Plan Note (Addendum)
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  PET scan images were reviewed with patient.- good partial response.  Labs are reviewed and discussed with patient. Proceed with maintenance Alimta/Keytruda today. -dose reduce Alimta 350mg /m2 I am holding Keytruda due to persistent radiographic evidence of enteritis. Will resume after repeat CT scan in the future, if enteritis resolves.  Repeat CT chest abdomen pelvis w contrast   #Hypokalemia, recommend potassium supplementation 69meq daily.

## 2022-01-13 NOTE — Assessment & Plan Note (Signed)
Chemotherapy plan as listed above 

## 2022-01-13 NOTE — Assessment & Plan Note (Signed)
Recommend Zometa every 4- 6 weeks. Recommend calcium supplementation.

## 2022-01-13 NOTE — Progress Notes (Signed)
Pt here for follow up. No new concerns voiced.   

## 2022-01-13 NOTE — Progress Notes (Signed)
Hematology/Oncology Progress note [phone] [phone]            Patient Care Team: Micheal Frizzle, MD as PCP - General (Family Medicine) Micheal Nab, RN as Oncology Nurse Navigator   ASSESSMENT & PLAN:   Cancer Staging  Primary non-small cell carcinoma of upper lobe of left lung [facility]) Staging form: Lung, AJCC 8th Edition - Clinical: Stage IV (cT2b, cN1, cM1) - Signed by Micheal Server, MD on 08/06/2021   Primary non-small cell carcinoma of upper lobe of left lung ([place]) Stage IV lung adenocarcinoma with brain and bone metastasis.  PET scan images were reviewed with patient.- good partial response.  Labs are reviewed and discussed with patient. Proceed with maintenance Alimta/Keytruda today. -dose reduce Alimta 3952m/m2 I am holding Keytruda due to persistent radiographic evidence of enteritis. Will resume after repeat CT scan in the future, if enteritis resolves.  Repeat CT chest abdomen pelvis w contrast   #Hypokalemia, recommend potassium supplementation 172m daily.  Encounter for antineoplastic chemotherapy Chemotherapy plan as listed above  [place].   Stop  Eliquis 2.52m33mID.   Metastasis to bone (HCC) Recommend Zometa every 4- 6 weeks. Recommend calcium supplementation.   Neoplasm related pain Back pain, MRI lumbar showed compression fracture Continue PRN percocet Recommend patient to avoid heavy lifting  Anemia due to antineoplastic chemotherapy Hemoglobin is  Decreased.due to chemotherapy.  Monitor closely  B12 deficiency anemia continue  B12 injection every 6 weeks.     Orders Placed This Encounter  Procedures   CT CHEST ABDOMEN PELVIS W CONTRAST    To be done at the end of NOV 2023    Standing Status:   Future    Standing Expiration Date:   01/14/2023    Order Specific Question:   Preferred imaging location?    Answer:   [facility]    Order Specific Question:    Is Oral Contrast requested for this exam?    Answer:   Yes, Per Radiology protocol   Follow up Lab MD Alimta keyBeryle Flock weeks  All questions were answered. The patient knows to call the clinic with any problems, questions or concerns.  Micheal ServerD, [name] [facility] 01/13/2022      CHIEF COMPLAINTS/REASON FOR VISIT:  non-small cell lung cancer  HISTORY OF PRESENTING ILLNESS:   Micheal Donovan a  [age]o.  male presents for follow up of Non-small cell lung cancer.  Oncology History Overview Note  Diagnosis: Stage IIB T2b N1 M0 adenocarcinoma of the LUL, poorly differentiated     Primary non-small cell carcinoma of upper lobe of left lung ([place]4/13/2023 Initial Diagnosis   Primary non-small cell carcinoma of [facility] lobe of left [facility]21[facility]dizziness/gait changes. CT head showed acute to subacute intraparenchymal hemorrhage involving the left cerebellum.  Surrounding low-density vasogenic edema.  [facility] angiogram of the neck which showed bulky calcified plaques/stenosis of carotid artery, Followed by MRI brain. 06/12/2021, MRI of the brain showed no significant interval change in size of the left cerebellar intraparenchymal hematoma with unchanged regional mass effect and partial effacement of fourth ventricle but no upstream hydrocephalus. There is no discernible enhancement to suggest underlying mass lesion, though acute blood products could mask enhancement.   06/12/2021 a chest x-ray showed a 4.4 cm left middle lobe.  06/13/2021, CT chest with contrast showed a 4.3 x 3.6 x 3.3 cm lobular spiculated mass in the posterior left upper lobe with T3 to the lateral pleura and major fissure.  Metastatic left hilar lymphadenopathy.  Peripheral micronodularity posterior right costophrenic sulcus.  Aortic  atherosclerosis. 06/14/2021, CT abdomen pelvis showed right lower lobe pulmonary artery embolus.  No evidence of right heart strain.  No acute intra-abdominal or pelvic pathology.  Aortic atherosclerosis.  06/13/2021, patient underwent bronchoscopy with EBUS by Dr. Tamala Julian.  Biopsy from the fine-needle aspiration of station 11 mL showed malignant cells, consistent with poorly differentiated non-small cell carcinoma, consistent with adenocarcinoma.  Malignant cells are TTF-1 positive and negative for p40.  Negative for neuroendocrine markers.  Blood test and tissue sample were tested for Gardant 360  -PD-L1 TPS 97%, no actionable mutation on the blood testing. 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   07/17/2021 Cancer Staging   Staging form: Lung, AJCC 8th Edition - Clinical: Stage IV (cT2b, cN1, cM1) - Signed by Micheal Server, MD on 08/06/2021   08/06/2021 Imaging   I saw patient's PET scan after his visit with me on 08/06/21. PET scan was ordered by his previous oncologist group and did not come to my in basket.. Patient received cycle 1 carboplatin Taxol today.  He has started on radiation.  5/26/3 PET scan showed 3.8 cm hypermetabolic left upper lobe mass with hypermetabolic left hilar and infrahilar adenopathy.  There is approximately 8 scattered metastatic lesions in the skeleton. Mixed density photopenic lesion in the left cerebellum. characterized as hemorrhage on recent prior imaging workups.    08/06/2021 - 08/06/2021 Chemotherapy   Patient is on Treatment Plan : LUNG Carboplatin / Paclitaxel + XRT q7d     08/16/2021 -  Chemotherapy   Switched to systemic chemotherapy with  Carboplatin (4.5) + Pemetrexed (400) + Pembrolizumab (200) D1 q21d Induction x 4 cycles      08/16/2021 -  Chemotherapy   Patient is on Treatment Plan : LUNG Carboplatin (5) + Pemetrexed + Pembrolizumab (200) D1 q21d Induction x 4 cycles / Maintenance Pemetrexed + Pembrolizumab (200) D1 q21d     08/22/2021 Imaging    MRI brain w wo contrast  Decrease in size of left cerebellar hematoma with resolution of edema. No evidence of underlying lesion. Smaller, more recent hemorrhage in the left cerebellar vermis with minimal edema. There is minimal enhancement without definite evidence of underlying lesion.  New punctate focus of chronic blood products and enhancement in the left frontal lobe. Additional new foci of chronic blood products in the posterior right putamen, right parietal subcortical white matter, and posterior right cerebellum. Unclear at this time if these represent foci of bland hemorrhage or early metastases.   Increase in size of right parietal osseous metastasis with minor extraosseous extension.    10/22/2021 - 10/22/2021 Chemotherapy   Patient is on Treatment Plan : LUNG Carboplatin (5) + Pemetrexed (500) + Pembrolizumab (200) D1 q21d Induction x 4 cycles / Maintenance Pemetrexed (500) + Pembrolizumab (200) D1 q21d      Imaging   PET scan showed 1. Interval response to therapy as evidenced by a small residual left upper lobe nodule with decreased hypermetabolism, no residual hypermetabolic adenopathy and decreased hypermetabolism associatedwith osseous metastases. 2. 6 mm posterior left upper lobe nodule, likely stable. Recommend attention on follow-up. 3. Aortic atherosclerosis (ICD10-I70.0). Coronary artery calcification.   12/17/2021 Imaging   MRI lumbar spine w wo contrast  1. No evidence of regional metastatic  disease. 2. Late subacute superior endplate fracture at L2 and large superior endplate Schmorl's node at L5, unchanged since the PET scan of 10/29/2021. 3. L3-4: Shallow disc protrusion. Facet and ligamentous hypertrophy. Stenosis of both lateral recesses. Findings slightly worsened since 2022. 4. L4-5: Shallow disc protrusion. Facet and ligamentous hypertrophy. Small synovial cyst arising from the facet joint on the right. Stenosis of the lateral recesses right worse than left.  Foraminal narrowing right worse than left. Findings have worsened since 2022.  5. L5-S1: Endplate osteophytes and shallow protrusion of the disc. Facet and ligamentous hypertrophy. Stenosis of the subarticular lateral recesses and neural foramina, right worse than left. Similar appearance to the study of 2022. 6. Continued evidence of enteritis of at least 1 loop of small bowel. Distended bladder present   12/18/2021 Imaging   Brian MRI w wo  1. Interval decrease in size of the left cerebellar and vermian hematomas. No definite evidence of underlying metastatic lesion. 2. There is are two possible contrast enhancing lesions in the posterior left frontal lobe and left occipital lobe, which do not have intrinsic T1 signal abnormality, but have a somewhat linear appearance and may be vascular in nature. Recommend attention on follow up. 3. Multifocal sites of susceptibility artifact are redemonstrated with interval development of a few new foci, as described above. All of these sites demonstrate intrinsic T1 hyperintense signal abnormality and susceptibility artifact and are compatible with sites of microhemorrhages. Recommend continued attention on follow up. 4. Interval decrease in size of right parietal calvarial metastatic lesion. No new contrast enhancing lesions visualized. 5. Diffusely heterogeneous marrow signal throughout the cervical spine, which is nonspecific but can be seen in the setting of anemia, smoking, obesity, or a marrow replacement process.     12/18/2021 Imaging   MRI lumbar spine w wo contrast  1. No evidence of regional metastatic disease. 2. Late subacute superior endplate fracture at L2 and large superior endplate Schmorl's node at L5, unchanged since the PET scan of 10/29/2021 3. L3-4: Shallow disc protrusion. Facet and ligamentous hypertrophy.Stenosis of both lateral recesses. Findings slightly worsened since 2022. 4. L4-5: Shallow disc protrusion. Facet and ligamentous  hypertrophy. Small synovial cyst arising from the facet joint on the right. Stenosis of the lateral recesses right worse than left. Foraminal narrowing right worse than left. Findings have worsened since 2022. 5. L5-S1: Endplate osteophytes and shallow protrusion of the disc. Facet and ligamentous hypertrophy. Stenosis of the subarticular lateral recesses and neural foramina, right worse than left. Similar appearance to the study of 2022. 6. Continued evidence of enteritis of at least 1 loop of small bowel. Distended bladder present.       # Patient has a history of melanoma on his neck, treated in 2009. # History of hemorrhagic infarct left cerebellum  INTERVAL HISTORY Micheal Donovan is a 71 y.o. male who has above history reviewed by me today presents for follow up visit for management of  Stage IV lung adenocarcianoma Patient has gained weight.  Overall tolerates chemotherapy with mild to moderate difficulties.  # Pulmonary embolism, SOB is stable, not worse.  +chronic fatigue.  + Insomnia and nocturia. On Ativan QHS.    Review of Systems  Constitutional:  Positive for fatigue. Negative for appetite change, chills, fever and unexpected weight change.  HENT:   Negative for hearing loss and voice change.   Eyes:  Negative for eye problems and icterus.  Respiratory:  Positive for shortness of breath. Negative for chest tightness and  cough.   Cardiovascular:  Negative for chest pain and leg swelling.  Gastrointestinal:  Negative for abdominal distention, abdominal pain and blood in stool.  Endocrine: Negative for hot flashes.  Genitourinary:  Negative for difficulty urinating, dysuria and frequency.   Musculoskeletal:  Negative for arthralgias.  Skin:  Negative for itching and rash.  Neurological:  Negative for extremity weakness, headaches, light-headedness and numbness.  Hematological:  Negative for adenopathy. Does not bruise/bleed easily.  Psychiatric/Behavioral:  Positive for  sleep disturbance. Negative for confusion.     MEDICAL HISTORY:  Past Medical History:  Diagnosis Date   Allergy    BPH (benign prostatic hypertrophy)    Cancer (HCC)    Melanoma on Neck    2008   Carotid artery occlusion    Diabetes mellitus    type 2   ED (erectile dysfunction)    GERD (gastroesophageal reflux disease)    Hemorrhagic stroke ([place]) 06/2021   Hyperlipidemia    Hypertension    Lung cancer ([place])    Retinopathy due to secondary DM ([place])     SURGICAL HISTORY: Past Surgical History:  Procedure Laterality Date   BRONCHIAL NEEDLE ASPIRATION BIOPSY  06/17/2021   Procedure: BRONCHIAL NEEDLE ASPIRATION BIOPSIES;  Surgeon: Candee Furbish, MD;  Location: [facility] ENDOSCOPY;  Service: Pulmonary;;   IR IMAGING GUIDED PORT INSERTION  08/05/2021   MELANOMA EXCISION  2008   Left side of neck   RADIOLOGY WITH ANESTHESIA N/A 04/05/2020   Procedure: MRI SPINE WITOUT CONTRAST;  Surgeon: Radiologist, Medication, MD;  Location: [place];  Service: Radiology;  Laterality: N/A;   RADIOLOGY WITH ANESTHESIA N/A 08/22/2021   Procedure: MRI BRAIN WITH AND WITHOUT CONTRAST  WITH ANESTHESIA;  Surgeon: Radiologist, Medication, MD;  Location: [place];  Service: Radiology;  Laterality: N/A;   RADIOLOGY WITH ANESTHESIA N/A 12/17/2021   Procedure: MRI BRAIN WITH AND WITHOUT CONTRAST WITH ANESTHESIA; MRI LUMBER WITH AND WITHOUT CONTRAST;  Surgeon: Radiologist, Medication, MD;  Location: [place];  Service: Radiology;  Laterality: N/A;   TONSILLECTOMY     VIDEO BRONCHOSCOPY WITH ENDOBRONCHIAL ULTRASOUND N/A 06/17/2021   Procedure: VIDEO BRONCHOSCOPY WITH ENDOBRONCHIAL ULTRASOUND;  Surgeon: Candee Furbish, MD;  Location[facility] ENDOSCOPY;  Service: Pulmonary;  Laterality: N/A;    SOCIAL HISTORY: Social History   Socioeconomic History   Marital status: Married    Spouse name: Not on file   Number of children: Not on file   Years of education: Not on file   Highest education level: Not on file  Occupational  History   Not on file  Tobacco Use   Smoking status: Former    Types: Cigarettes    Quit date: 07/21/1990    Years since quitting: 31.5   Smokeless tobacco: Never  Vaping Use   Vaping Use: Never used  Substance and Sexual Activity   Alcohol use: No   Drug use: No   Sexual activity: Not on file  Other Topics Concern   Not on file  Social History Narrative   Not on file   Social Determinants of Health   Financial Resource Strain: Low Risk  (11/21/2021)   Overall Financial Resource Strain (CARDIA)    Difficulty of Paying Living Expenses: Not hard at all  Food Insecurity: No Food Insecurity (11/19/2021)   Hunger Vital Sign    Worried About Running Out of Food in the Last Year: Never true    Ran Out of Food in the Last Year: Never true  Transportation Needs: No Transportation  Needs (11/21/2021)   PRAPARE - Hydrologist (Medical): No    Lack of Transportation (Non-Medical): No  Physical Activity: Inactive (07/31/2021)   Exercise Vital Sign    Days of Exercise per Week: 0 days    Minutes of Exercise per Session: 0 min  Stress: Unknown (07/31/2021)   [place]    Feeling of Stress : Patient refused  Social Connections: Unknown (07/31/2021)   Social Connection and Isolation Panel [NHANES]    Frequency of Communication with Friends and Family: Three times a week    Frequency of Social Gatherings with Friends and Family: Three times a week    Attends Religious Services: Patient refused    Active Member of Clubs or Organizations: Patient refused    Attends Archivist Meetings: Patient refused    Marital Status: Married  Human resources officer Violence: Not At Risk (07/31/2021)   Humiliation, Afraid, Rape, and Kick questionnaire    Fear of Current or Ex-Partner: No    Emotionally Abused: No    Physically Abused: No    Sexually Abused: No    FAMILY HISTORY: Family History  Problem  Relation Age of Onset   COPD Mother    Heart disease Father    Heart disease Brother        MI at age 73   Stroke Neg Hx     ALLERGIES:  is allergic to niaspan [niacin].  MEDICATIONS:  Current Outpatient Medications  Medication Sig Dispense Refill   dexamethasone (DECADRON) 4 MG tablet Take 1 tab every 12 hours the day before pemetrexed chemo, then take 2 tabs once a day for 3 days starting the day after carboplatin. 30 tablet 1   LORazepam (ATIVAN) 1 MG tablet Take 1 tablet (1 mg total) by mouth at bedtime. 30 tablet 2   NOVOLOG FLEXPEN 100 UNIT/ML FlexPen Inject 0-10 Units into the skin daily as needed for high blood sugar (above 200). Sliding scale     omeprazole (PRILOSEC) 20 MG capsule Take 20 mg by mouth daily.     oxyCODONE-acetaminophen (PERCOCET) 7.5-325 MG tablet Take 1 tablet by mouth every 4 (four) hours as needed for severe pain. 60 tablet 0   predniSONE (DELTASONE) 20 MG tablet 3 tabs poqday 1-2, 2 tabs poqday 3-4, 1 tab poqday 5-6 12 tablet 0   tamsulosin (FLOMAX) 0.4 MG CAPS capsule TAKE 1 CAPSULE(0.4 MG) BY MOUTH DAILY 90 capsule 0   UNABLE TO FIND PLEASE CHECK FASTING BLOOD GLUCOSE EVERY MORNING 1 each 0   No current facility-administered medications for this visit.   Facility-Administered Medications Ordered in Other Visits  Medication Dose Route Frequency Provider Last Rate Last Admin   heparin lock flush 100 UNIT/ML injection              PHYSICAL EXAMINATION:  Vitals:   01/13/22 0916  BP: (!) 163/80  Pulse: 66  Resp: 18  Temp: (!) 97.2 F (36.2 C)   Filed Weights   01/13/22 0916  Weight: 147 lb 4.8 oz (66.8 kg)    Physical Exam Constitutional:      General: He is not in acute distress.    Comments: Patient sits in the wheelchair  HENT:     Head: Normocephalic and atraumatic.  Eyes:     General: No scleral icterus. Cardiovascular:     Rate and Rhythm: Normal rate and regular rhythm.     Heart sounds: Normal heart sounds.  Pulmonary:      Effort: Pulmonary effort is normal. No respiratory distress.     Breath sounds: No wheezing.     Comments: Decreased breath sound bilaterally. Abdominal:     General: Bowel sounds are normal. There is no distension.     Palpations: Abdomen is soft.  Musculoskeletal:        General: No deformity. Normal range of motion.     Cervical back: Normal range of motion and neck supple.  Skin:    General: Skin is warm and dry.     Findings: No erythema or rash.  Neurological:     Mental Status: He is alert and oriented to person, place, and time. Mental status is at baseline.     Cranial Nerves: No cranial nerve deficit.     Coordination: Coordination normal.  Psychiatric:        Mood and Affect: Mood normal.     LABORATORY DATA:  I have reviewed the data as listed    Latest Ref Rng & Units 01/13/2022    9:02 AM 12/23/2021   10:22 AM 12/10/2021    2:20 PM  CBC  WBC 4.0 - 10.5 K/uL 7.6  10.2  2.2   Hemoglobin 13.0 - 17.0 g/dL 9.4  9.8  10.2   Hematocrit 39.0 - 52.0 % 29.7  30.3  31.3   Platelets 150 - 400 K/uL 382  368  182       Latest Ref Rng & Units 12/23/2021   10:22 AM 12/10/2021    2:20 PM 12/02/2021    1:05 PM  CMP  Glucose 70 - 99 mg/dL 290  225  159   BUN 8 - 23 mg/dL _0 Creatinine 0.61 - 1.24 mg/dL 0.96  0.89  0.76   Sodium 135 - 145 mmol/L 130  135  134   Potassium 3.5 - 5.1 mmol/L 3.9  5.0  3.8   Chloride 98 - 111 mmol/L 97  99  102   CO2 22 - 32 mmol/L _1 Calcium 8.9 - 10.3 mg/dL 8.7  8.7  8.6   Total Protein 6.5 - 8.1 g/dL 7.2   6.9   Total Bilirubin 0.3 - 1.2 mg/dL 0.2   0.4   Alkaline Phos 38 - 126 U/L 100   90   AST 15 - 41 U/L 31   21   ALT 0 - 44 U/L 19   15      Iron/TIBC/Ferritin/ %Sat    Component Value Date/Time   IRON 21 (L) 11/19/2021 0822   TIBC 246 (L) 11/19/2021 0822   FERRITIN 895 (H) 11/19/2021 0822   IRONPCTSAT 9 (L) 11/19/2021 7017      RADIOGRAPHIC STUDIES: I have personally reviewed the radiological images as  listed and agreed with the findings in the report. MR Brain W Wo Contrast  Result Date: 12/18/2021 CLINICAL DATA:  Non-small cell lung cancer follow up EXAM: MRI HEAD WITHOUT AND WITH CONTRAST TECHNIQUE: Multiplanar, multiecho pulse sequences of the brain and surrounding structures were obtained without and with intravenous contrast. CONTRAST:  56m GADAVIST GADOBUTROL 1 MMOL/ML IV SOLN COMPARISON:  08/22/21 FINDINGS: Brain: No acute hydrocephalus, extra-axial collection. Subcortical and periventricular T2/FLAIR hyperintense lesions are favored to represent sequela of chronic microvascular ischemic change. Redemonstrated are multifocal lobar distribution foci of susceptibility artifact with a new focus in the left parietal lobe (series 5, image 64) and an additional new focus in the  peripheral left frontal lobe (series 5, image 58) and an additional in the peripheral right temporal lobe (series 5, image 40). Redemonstrated is a parenchymal hematoma in the left cerebellar hemisphere with interval decrease in blood products. The intrinsic T1 signal abnormality associated with this lesion now measures up to 1.1 x 1.1 cm, previously up to 2.9 x 2.5 cm. - previously seen intrinsically T1 hyperintenense lesion in the cerebellar vermis no longer demonstrates T1 signal abnormality and is without associated contrast enhanacement. - there is a persistent contrast-enhancing lesion in the posterior left frontal lobe (series 2000, image 199), unchanged from prior measuring up to 2 mm. This lesion does not demonstrate intrinsic T1 hyperintense signal abnormality or susceptibility artiafct. There may be an additional contrast enhancing lesion in the left occipital lobe (series 2000, image 182). These lesions have a somewhat linear appearance and are positioned along margins of small vessels. - there is a persistent intrinsically T1 hyperintense lesion in the left frontal lobe measuring 4 mm, unchanged from prior (series 2000,  image 207 / series 600, image 207) with associated susceptibility artifact. -there is new 2 mm intrinsically T1 hyperintense lesion in the peripheral left frontal lobe, which correlates with a site of susceptibility artifact (series 2000, 204). -there is a new 3 mm intrinsically T1 hyperintense lesion in the peripheral right temporal lobe (series 2000, image 169), which correlates with a site of susceptibility artifact Vascular: Normal flow voids. Skull and upper cervical spine: There is diffusely heterogeneous marrow signal throughout the cervical spine. There is a soft tissue lipoma along left frontal scalp. Interval decrease in size of the right parietal calvarial metastatic lesion (series 2000, image 219). Sinuses/Orbits: Polypoid mucosal thickening bilateral maxillary sinuses. Other: On IMPRESSION: 1. Interval decrease in size of the left cerebellar and vermian hematomas. No definite evidence of underlying metastatic lesion. 2. There is are two possible contrast enhancing lesions in the posterior left frontal lobe and left occipital lobe, which do not have intrinsic T1 signal abnormality, but have a somewhat linear appearance and may be vascular in nature. Recommend attention on follow up. 3. Multifocal sites of susceptibility artifact are redemonstrated with interval development of a few new foci, as described above. All of these sites demonstrate intrinsic T1 hyperintense signal abnormality and susceptibility artifact and are compatible with sites of microhemorrhages. Recommend continued attention on follow up. 4. Interval decrease in size of right parietal calvarial metastatic lesion. No new contrast enhancing lesions visualized. 5. Diffusely heterogeneous marrow signal throughout the cervical spine, which is nonspecific but can be seen in the setting of anemia, smoking, obesity, or a marrow replacement process. Electronically Signed   By: Marin Roberts M.D.   On: 12/18/2021 16:38   MR Lumbar Spine W Wo  Contrast  Result Date: 12/18/2021 CLINICAL DATA:  Non-small cell lung cancer. EXAM: MRI LUMBAR SPINE WITHOUT AND WITH CONTRAST TECHNIQUE: Multiplanar and multiecho pulse sequences of the lumbar spine were obtained without and with intravenous contrast. CONTRAST:  46m GADAVIST GADOBUTROL 1 MMOL/ML IV SOLN COMPARISON:  PET scan 10/29/2021.  Lumbar MRI 04/05/2020. FINDINGS: Segmentation:  5 lumbar type vertebral bodies. Alignment:  Straightening of the normal lumbar lordosis. Vertebrae: Late subacute superior endplate fracture at L2, not changed from PET scan of 10/29/2021. Large superior endplate Schmorl's node at L5, not changed since the prior PET scan. These look like benign entities. There is no evidence of regional metastatic disease. Conus medullaris and cauda equina: Conus extends to the L1 level. Conus and cauda equina appear  normal. Paraspinal and other soft tissues: Continued evidence of enteritis of at least 1 loop of small bowel seen on the sagittal imaging. Distended bladder. Disc levels: T12-L1: Normal L1-2: Mild bulging of the disc.  No compressive stenosis. L2-3: Mild bulging of the disc.  No compressive stenosis. L3-4: Shallow disc protrusion. Mild facet and ligamentous hypertrophy. Stenosis of both lateral recesses. Findings are slightly worsened compared to the study of February 2022. L4-5: Shallow protrusion of the disc with annular fissures. Facet and ligamentous hypertrophy. Small synovial cyst arising from the facet joint on the right. Stenosis of the lateral recesses that could be symptomatic, worse on the right than the left. Foraminal narrowing right worse than left. Findings have worsened since the study of 2022. L5-S1: Endplate osteophytes and shallow protrusion of the disc. Facet and ligamentous hypertrophy. Stenosis of the subarticular lateral recesses and neural foramina, right worse than left. Similar appearance to the study of 2022. IMPRESSION: 1. No evidence of regional metastatic  disease. 2. Late subacute superior endplate fracture at L2 and large superior endplate Schmorl's node at L5, unchanged since the PET scan of 10/29/2021. 3. L3-4: Shallow disc protrusion. Facet and ligamentous hypertrophy. Stenosis of both lateral recesses. Findings slightly worsened since 2022. 4. L4-5: Shallow disc protrusion. Facet and ligamentous hypertrophy. Small synovial cyst arising from the facet joint on the right. Stenosis of the lateral recesses right worse than left. Foraminal narrowing right worse than left. Findings have worsened since 2022. 5. L5-S1: Endplate osteophytes and shallow protrusion of the disc. Facet and ligamentous hypertrophy. Stenosis of the subarticular lateral recesses and neural foramina, right worse than left. Similar appearance to the study of 2022. 6. Continued evidence of enteritis of at least 1 loop of small bowel. Distended bladder present. Electronically Signed   By: Nelson Chimes M.D.   On: 12/18/2021 16:09

## 2022-01-13 NOTE — Assessment & Plan Note (Signed)
Hemoglobin is  Decreased.due to chemotherapy.  Monitor closely

## 2022-01-13 NOTE — Patient Instructions (Signed)
[facility]  Discharge Instructions: Thank you for choosing [facility] to provide your oncology and hematology care.  If you have a lab appointment with the Cancer Center, please go directly to the Cancer Center and check in at the registration area.  Wear comfortable clothing and clothing appropriate for easy access to any Portacath or PICC line.   We strive to give you quality time with your provider. You may need to reschedule your appointment if you arrive late (15 or more minutes).  Arriving late affects you and other patients whose appointments are after yours.  Also, if you miss three or more appointments without notifying the office, you may be dismissed from the clinic at the provider's discretion.      For prescription refill requests, have your pharmacy contact our office and allow 72 hours for refills to be completed.    Today you received the following chemotherapy and/or immunotherapy agents: Alimta      To help prevent nausea and vomiting after your treatment, we encourage you to take your nausea medication as directed.  BELOW ARE SYMPTOMS THAT SHOULD BE REPORTED IMMEDIATELY: *FEVER GREATER THAN 100.4 F (38 C) OR HIGHER *CHILLS OR SWEATING *NAUSEA AND VOMITING THAT IS NOT CONTROLLED WITH YOUR NAUSEA MEDICATION *UNUSUAL SHORTNESS OF BREATH *UNUSUAL BRUISING OR BLEEDING *URINARY PROBLEMS (pain or burning when urinating, or frequent urination) *BOWEL PROBLEMS (unusual diarrhea, constipation, pain near the anus) TENDERNESS IN MOUTH AND THROAT WITH OR WITHOUT PRESENCE OF ULCERS (sore throat, sores in mouth, or a toothache) UNUSUAL RASH, SWELLING OR PAIN  UNUSUAL VAGINAL DISCHARGE OR ITCHING   Items with * indicate a potential emergency and should be followed up as soon as possible or go to the Emergency Department if any problems should occur.  Please show the CHEMOTHERAPY ALERT CARD or IMMUNOTHERAPY ALERT CARD at check-in to the  Emergency Department and triage nurse.  Should you have questions after your visit or need to cancel or reschedule your appointment, please contact [facility]  [phone] and follow the prompts.  Office hours are 8:00 a.m. to 4:30 p.m. Monday - Friday. Please note that voicemails left after 4:00 p.m. may not be returned until the following business day.  We are closed weekends and major holidays. You have access to a nurse at all times for urgent questions. Please call the main number to the clinic [phone] and follow the prompts.  For any non-urgent questions, you may also contact your provider using MyChart. We now offer e-Visits for anyone 18 and older to request care online for non-urgent symptoms. For details visit [email or web address].   Also download the MyChart app! Go to the app store, search "MyChart", open the app, select [facility], and log in with your MyChart username and password.  Masks are optional in the cancer centers. If you would like for your care team to wear a mask while they are taking care of you, please let them know. For doctor visits, patients may have with them one support person who is at least 71 years old. At this time, visitors are not allowed in the infusion area.   

## 2022-01-13 NOTE — Assessment & Plan Note (Signed)
continue  B12 injection every 6 weeks.

## 2022-01-27 ENCOUNTER — Ambulatory Visit
Admission: RE | Admit: 2022-01-27 | Discharge: 2022-01-27 | Disposition: A | Discharge status: Home / Self Care | Payer: PPO | Source: Ambulatory Visit | Attending: Oncology | Admitting: Oncology

## 2022-01-27 DIAGNOSIS — C3412 Malignant neoplasm of upper lobe, left bronchus or lung: Secondary | ICD-10-CM | POA: Diagnosis not present

## 2022-01-27 DIAGNOSIS — C3490 Malignant neoplasm of unspecified part of unspecified bronchus or lung: Secondary | ICD-10-CM | POA: Diagnosis not present

## 2022-01-27 DIAGNOSIS — I700 Atherosclerosis of aorta: Secondary | ICD-10-CM | POA: Diagnosis not present

## 2022-01-27 DIAGNOSIS — K760 Fatty (change of) liver, not elsewhere classified: Secondary | ICD-10-CM | POA: Diagnosis not present

## 2022-01-27 DIAGNOSIS — I2510 Atherosclerotic heart disease of native coronary artery without angina pectoris: Secondary | ICD-10-CM | POA: Diagnosis not present

## 2022-01-27 DIAGNOSIS — C7951 Secondary malignant neoplasm of bone: Secondary | ICD-10-CM | POA: Diagnosis not present

## 2022-01-27 MED ORDER — IOHEXOL 300 MG/ML  SOLN
100.0000 mL | Freq: Once | INTRAMUSCULAR | Status: AC | PRN
  Administered 2022-01-27: 100 mL via INTRAVENOUS

## 2022-01-29 ENCOUNTER — Ambulatory Visit (INDEPENDENT_AMBULATORY_CARE_PROVIDER_SITE_OTHER): Payer: PPO | Admitting: Family Medicine

## 2022-01-29 ENCOUNTER — Encounter: Payer: Self-pay | Admitting: Family Medicine

## 2022-01-29 VITALS — BP 142/76 | HR 96 | Ht 67.0 in | Wt 148.6 lb

## 2022-01-29 DIAGNOSIS — G8929 Other chronic pain: Secondary | ICD-10-CM | POA: Diagnosis not present

## 2022-01-29 DIAGNOSIS — Z23 Encounter for immunization: Secondary | ICD-10-CM | POA: Diagnosis not present

## 2022-01-29 DIAGNOSIS — M47816 Spondylosis without myelopathy or radiculopathy, lumbar region: Secondary | ICD-10-CM | POA: Diagnosis not present

## 2022-01-29 DIAGNOSIS — M545 Low back pain, unspecified: Secondary | ICD-10-CM | POA: Diagnosis not present

## 2022-01-29 MED ORDER — PREDNISONE 20 MG PO TABS: ORAL_TABLET | ORAL | 0 refills | Status: DC

## 2022-01-29 NOTE — Progress Notes (Signed)
Subjective:    Patient ID: Micheal Donovan, male    DOB: 07-May-1950, 71 y.o.   MRN: [id number]  Back Pain  01/01/22 Patient is a 71 year old Caucasian gentleman with a history of metastatic lung cancer who recently had an MRI on his lower back: IMPRESSION: 1. No evidence of regional metastatic disease. 2. Late subacute superior endplate fracture at L2 and large superior endplate Schmorl's node at L5, unchanged since the PET scan of 10/29/2021. 3. L3-4: Shallow disc protrusion. Facet and ligamentous hypertrophy. Stenosis of both lateral recesses. Findings slightly worsened since 2022. 4. L4-5: Shallow disc protrusion. Facet and ligamentous hypertrophy. Small synovial cyst arising from the facet joint on the right. Stenosis of the lateral recesses right worse than left. Foraminal narrowing right worse than left. Findings have worsened since 2022. 5. L5-S1: Endplate osteophytes and shallow protrusion of the disc. Facet and ligamentous hypertrophy. Stenosis of the subarticular lateral recesses and neural foramina, right worse than left. Similar appearance to the study of 2022. 6. Continued evidence of enteritis of at least 1 loop of small bowel. Distended bladder present.  He reports severe pain in his back.  When I ask him to point to the area where he feels the most pain, he places his hand in the center around the level of L5.  He denies any numbness or tingling or pain radiating down his legs.  He states that he had similar pain last year and received a cortisone injection in his back under the care of Dr. Tonita Cong.  The pain improved.  He is no longer experiencing lumbar radiculopathy but he is having constant severe pain in the center of his back.  On his MRI he has 2 areas that could be the biggest source of pain.  He has a subacute fracture at L2 and he also has an osteophyte at L5 both of which could potentially be causing his pain.  He denies any bowel or bladder incontinence.  He denies  any saddle anesthesia.  He is requesting a referral to a specialist to treat his back pain.  At that time, my plan was: Based on his MRI, I am not certain of whether a cortisone injection at L5 would benefit the patient or whether he would benefit more from kyphoplasty of L2.  I gave the patient a prednisone taper pack in effect to mimic a cortisone injection in his lower back.  If he sees significant benefit from the prednisone taper pack, I will refer the patient to interventional radiology for an epidural steroid injection at L5.  If he does not see significant benefit, I would recommend seeing neurosurgery to discuss vertebroplasty at L2  01/29/22  Patient states that he saw significant benefit from the prednisone after he took it.  The pain in his lower back is doing much better however unfortunately he recently fell reaggravating his back.  Patient states that he fell and landed directly on his tailbone..  Ever since that time he is again experiencing pain roughly around the level of L5 in the center of his back.  Is also radiating into his left posterior hip.  He denies any saddle anesthesia.  He denies any bowel or bladder incontinence.  He denies any paresthesias in his legs.  Previously he had a cortisone injection performed by Dr. Nelva Bush and he saw significant benefit.  He would like to see Dr. Nelva Bush again to see if he could perform the injection. Past Medical History:  Diagnosis Date   Allergy  BPH (benign prostatic hypertrophy)    Cancer (HCC)    Melanoma on Neck    2008   Carotid artery occlusion    Diabetes mellitus    type 2   ED (erectile dysfunction)    GERD (gastroesophageal reflux disease)    Hemorrhagic stroke ([place]) 06/2021   Hyperlipidemia    Hypertension    Lung cancer ([place])    Retinopathy due to secondary DM [facility])    Past Surgical History:  Procedure Laterality Date   BRONCHIAL NEEDLE ASPIRATION BIOPSY  06/17/2021   Procedure: BRONCHIAL NEEDLE ASPIRATION BIOPSIES;   Surgeon: Candee Furbish, MD;  Location: [facility] ENDOSCOPY;  Service: Pulmonary;;   IR IMAGING GUIDED PORT INSERTION  08/05/2021   MELANOMA EXCISION  2008   Left side of neck   RADIOLOGY WITH ANESTHESIA N/A 04/05/2020   Procedure: MRI SPINE WITOUT CONTRAST;  Surgeon: Radiologist, Medication, MD;  Location: [place];  Service: Radiology;  Laterality: N/A;   RADIOLOGY WITH ANESTHESIA N/A 08/22/2021   Procedure: MRI BRAIN WITH AND WITHOUT CONTRAST  WITH ANESTHESIA;  Surgeon: Radiologist, Medication, MD;  Location: [place];  Service: Radiology;  Laterality: N/A;   RADIOLOGY WITH ANESTHESIA N/A 12/17/2021   Procedure: MRI BRAIN WITH AND WITHOUT CONTRAST WITH ANESTHESIA; MRI LUMBER WITH AND WITHOUT CONTRAST;  Surgeon: Radiologist, Medication, MD;  Location: [place];  Service: Radiology;  Laterality: N/A;   TONSILLECTOMY     VIDEO BRONCHOSCOPY WITH ENDOBRONCHIAL ULTRASOUND N/A 06/17/2021   Procedure: VIDEO BRONCHOSCOPY WITH ENDOBRONCHIAL ULTRASOUND;  Surgeon: Candee Furbish, MD;  Location: [facility] ENDOSCOPY;  Service: Pulmonary;  Laterality: N/A;   Current Outpatient Medications on File Prior to Visit  Medication Sig Dispense Refill   dexamethasone (DECADRON) 4 MG tablet Take 1 tab every 12 hours the day before pemetrexed chemo, then take 2 tabs once a day for 3 days starting the day after carboplatin. 30 tablet 1   LORazepam (ATIVAN) 1 MG tablet Take 1 tablet (1 mg total) by mouth at bedtime. 30 tablet 2   NOVOLOG FLEXPEN 100 UNIT/ML FlexPen Inject 0-10 Units into the skin daily as needed for high blood sugar (above 200). Sliding scale     omeprazole (PRILOSEC) 20 MG capsule Take 20 mg by mouth daily.     oxyCODONE-acetaminophen (PERCOCET) 7.5-325 MG tablet Take 1 tablet by mouth every 4 (four) hours as needed for severe pain. 60 tablet 0   predniSONE (DELTASONE) 20 MG tablet 3 tabs poqday 1-2, 2 tabs poqday 3-4, 1 tab poqday 5-6 12 tablet 0   tamsulosin (FLOMAX) 0.4 MG CAPS capsule TAKE 1 CAPSULE(0.4 MG) BY MOUTH DAILY  90 capsule 0   UNABLE TO FIND PLEASE CHECK FASTING BLOOD GLUCOSE EVERY MORNING 1 each 0   [DISCONTINUED] prochlorperazine (COMPAZINE) 10 MG tablet Take 1 tablet (10 mg total) by mouth every 6 (six) hours as needed (Nausea or vomiting). 30 tablet 1   Current Facility-Administered Medications on File Prior to Visit  Medication Dose Route Frequency Provider Last Rate Last Admin   heparin lock flush 100 UNIT/ML injection              Allergies  Allergen Reactions   Niaspan [Niacin]     FLUSHING   Social History   Socioeconomic History   Marital status: Married    Spouse name: Not on file   Number of children: Not on file   Years of education: Not on file   Highest education level: Not on file  Occupational History   Not  on file  Tobacco Use   Smoking status: Former    Types: Cigarettes    Quit date: 07/21/1990    Years since quitting: 31.5   Smokeless tobacco: Never  Vaping Use   Vaping Use: Never used  Substance and Sexual Activity   Alcohol use: No   Drug use: No   Sexual activity: Not on file  Other Topics Concern   Not on file  Social History Narrative   Not on file   Social Determinants of Health   Financial Resource Strain: Low Risk  (11/21/2021)   Overall Financial Resource Strain (CARDIA)    Difficulty of Paying Living Expenses: Not hard at all  Food Insecurity: No Food Insecurity (11/19/2021)   Hunger Vital Sign    Worried About Running Out of Food in the Last Year: Never true    Ran Out of Food in the Last Year: Never true  Transportation Needs: No Transportation Needs (11/21/2021)   PRAPARE - Hydrologist (Medical): No    Lack of Transportation (Non-Medical): No  Physical Activity: Inactive (07/31/2021)   Exercise Vital Sign    Days of Exercise per Week: 0 days    Minutes of Exercise per Session: 0 min  Stress: Unknown (07/31/2021)   [place]    Feeling of  Stress : Patient refused  Social Connections: Unknown (07/31/2021)   Social Connection and Isolation Panel [NHANES]    Frequency of Communication with Friends and Family: Three times a week    Frequency of Social Gatherings with Friends and Family: Three times a week    Attends Religious Services: Patient refused    Active Member of Clubs or Organizations: Patient refused    Attends Archivist Meetings: Patient refused    Marital Status: Married  Human resources officer Violence: Not At Risk (07/31/2021)   Humiliation, Afraid, Rape, and Kick questionnaire    Fear of Current or Ex-Partner: No    Emotionally Abused: No    Physically Abused: No    Sexually Abused: No    Past Medical History:  Diagnosis Date   Allergy    BPH (benign prostatic hypertrophy)    Cancer ([place])    Melanoma on Neck    2008   Carotid artery occlusion    Diabetes mellitus    type 2   ED (erectile dysfunction)    GERD (gastroesophageal reflux disease)    Hemorrhagic stroke ([place]) 06/2021   Hyperlipidemia    Hypertension    Lung cancer ([place])    Retinopathy due to secondary DM [facility])    Past Surgical History:  Procedure Laterality Date   BRONCHIAL NEEDLE ASPIRATION BIOPSY  06/17/2021   Procedure: BRONCHIAL NEEDLE ASPIRATION BIOPSIES;  Surgeon: Candee Furbish, MD;  Location: [place];  Service: Pulmonary;;   IR IMAGING GUIDED PORT INSERTION  08/05/2021   MELANOMA EXCISION  2008   Left side of neck   RADIOLOGY WITH ANESTHESIA N/A 04/05/2020   Procedure: MRI SPINE WITOUT CONTRAST;  Surgeon: Radiologist, Medication, MD;  Location: [place];  Service: Radiology;  Laterality: N/A;   RADIOLOGY WITH ANESTHESIA N/A 08/22/2021   Procedure: MRI BRAIN WITH AND WITHOUT CONTRAST  WITH ANESTHESIA;  Surgeon: Radiologist, Medication, MD;  Location: [place];  Service: Radiology;  Laterality: N/A;   RADIOLOGY WITH ANESTHESIA N/A 12/17/2021   Procedure: MRI BRAIN WITH AND WITHOUT CONTRAST WITH ANESTHESIA; MRI LUMBER WITH AND  WITHOUT CONTRAST;  Surgeon: Radiologist, Medication,  MD;  Location: [place];  Service: Radiology;  Laterality: N/A;   TONSILLECTOMY     VIDEO BRONCHOSCOPY WITH ENDOBRONCHIAL ULTRASOUND N/A 06/17/2021   Procedure: VIDEO BRONCHOSCOPY WITH ENDOBRONCHIAL ULTRASOUND;  Surgeon: Candee Furbish, MD;  Location: [facility] ENDOSCOPY;  Service: Pulmonary;  Laterality: N/A;   Current Outpatient Medications on File Prior to Visit  Medication Sig Dispense Refill   dexamethasone (DECADRON) 4 MG tablet Take 1 tab every 12 hours the day before pemetrexed chemo, then take 2 tabs once a day for 3 days starting the day after carboplatin. 30 tablet 1   LORazepam (ATIVAN) 1 MG tablet Take 1 tablet (1 mg total) by mouth at bedtime. 30 tablet 2   NOVOLOG FLEXPEN 100 UNIT/ML FlexPen Inject 0-10 Units into the skin daily as needed for high blood sugar (above 200). Sliding scale     omeprazole (PRILOSEC) 20 MG capsule Take 20 mg by mouth daily.     oxyCODONE-acetaminophen (PERCOCET) 7.5-325 MG tablet Take 1 tablet by mouth every 4 (four) hours as needed for severe pain. 60 tablet 0   predniSONE (DELTASONE) 20 MG tablet 3 tabs poqday 1-2, 2 tabs poqday 3-4, 1 tab poqday 5-6 12 tablet 0   tamsulosin (FLOMAX) 0.4 MG CAPS capsule TAKE 1 CAPSULE(0.4 MG) BY MOUTH DAILY 90 capsule 0   UNABLE TO FIND PLEASE CHECK FASTING BLOOD GLUCOSE EVERY MORNING 1 each 0   [DISCONTINUED] prochlorperazine (COMPAZINE) 10 MG tablet Take 1 tablet (10 mg total) by mouth every 6 (six) hours as needed (Nausea or vomiting). 30 tablet 1   Current Facility-Administered Medications on File Prior to Visit  Medication Dose Route Frequency Provider Last Rate Last Admin   heparin lock flush 100 UNIT/ML injection            Allergies  Allergen Reactions   Niaspan [Niacin]     FLUSHING   Social History   Socioeconomic History   Marital status: Married    Spouse name: Not on file   Number of children: Not on file   Years of education: Not on file   Highest  education level: Not on file  Occupational History   Not on file  Tobacco Use   Smoking status: Former    Types: Cigarettes    Quit date: 07/21/1990    Years since quitting: 31.5   Smokeless tobacco: Never  Vaping Use   Vaping Use: Never used  Substance and Sexual Activity   Alcohol use: No   Drug use: No   Sexual activity: Not on file  Other Topics Concern   Not on file  Social History Narrative   Not on file   Social Determinants of Health   Financial Resource Strain: Low Risk  (11/21/2021)   Overall Financial Resource Strain (CARDIA)    Difficulty of Paying Living Expenses: Not hard at all  Food Insecurity: No Food Insecurity (11/19/2021)   Hunger Vital Sign    Worried About Running Out of Food in the Last Year: Never true    [place] in the Last Year: Never true  Transportation Needs: No Transportation Needs (11/21/2021)   PRAPARE - Hydrologist (Medical): No    Lack of Transportation (Non-Medical): No  Physical Activity: Inactive (07/31/2021)   Exercise Vital Sign    Days of Exercise per Week: 0 days    Minutes of Exercise per Session: 0 min  Stress: Unknown (07/31/2021)   [place]  Questionnaire    Feeling of Stress : Patient refused  Social Connections: Unknown (07/31/2021)   Social Connection and Isolation Panel [NHANES]    Frequency of Communication with Friends and Family: Three times a week    Frequency of Social Gatherings with Friends and Family: Three times a week    Attends Religious Services: Patient refused    Active Member of Clubs or Organizations: Patient refused    Attends Archivist Meetings: Patient refused    Marital Status: Married  Human resources officer Violence: Not At Risk (07/31/2021)   Humiliation, Afraid, Rape, and Kick questionnaire    Fear of Current or Ex-Partner: No    Emotionally Abused: No    Physically Abused: No    Sexually Abused: No       Review of Systems  Musculoskeletal:  Positive for back pain.  All other systems reviewed and are negative.      Objective:   Physical Exam Vitals reviewed.  Constitutional:      General: He is not in acute distress.    Appearance: He is not ill-appearing.  HENT:     Mouth/Throat:     Mouth: Mucous membranes are moist.  Cardiovascular:     Rate and Rhythm: Normal rate and regular rhythm.     Heart sounds: Normal heart sounds. No murmur heard.    No friction rub. No gallop.  Pulmonary:     Effort: Pulmonary effort is normal. No respiratory distress.     Breath sounds: Normal breath sounds. No stridor. No wheezing, rhonchi or rales.  Musculoskeletal:       Back:  Neurological:     General: No focal deficit present.     Mental Status: He is alert and oriented to person, place, and time.     Cranial Nerves: No cranial nerve deficit.     Sensory: No sensory deficit.     Deep Tendon Reflexes: Reflexes normal.           Assessment & Plan:  Chronic midline low back pain, unspecified whether sciatica present - Plan: Ambulatory referral to Orthopedic Surgery  Spondylosis of lumbar spine - Plan: Ambulatory referral to Orthopedic Surgery Despite having the vertebral fracture at L2, majority of his pain seems to be centered at L5 and related to the spondylosis at that level.  Therefore I will refer the patient back to his orthopedic surgeon to see if he can receive cortisone injection.  He would like to try a prednisone taper pack again due to the severity of the pain in hopes that he can receive some pain relief until the orthopedist can see him.  I gave the patient a sliding scale for rapid acting insulin as I anticipate that the prednisone will likely exacerbate his glycemic control.  Sliding scale starts at 150 and prescribes 2 units of insulin for every 50 points greater than 150.

## 2022-01-29 NOTE — Addendum Note (Signed)
Addended by: Randal Buba K on: 01/29/2022 11:37 AM   Modules accepted: Orders

## 2022-02-03 ENCOUNTER — Inpatient Hospital Stay: Payer: PPO

## 2022-02-03 ENCOUNTER — Inpatient Hospital Stay (HOSPITAL_BASED_OUTPATIENT_CLINIC_OR_DEPARTMENT_OTHER): Payer: PPO | Admitting: Oncology

## 2022-02-03 ENCOUNTER — Inpatient Hospital Stay: Payer: PPO | Attending: Radiation Oncology

## 2022-02-03 ENCOUNTER — Encounter: Payer: Self-pay | Admitting: Oncology

## 2022-02-03 VITALS — BP 146/73 | HR 70 | Temp 96.6°F | Resp 18 | Wt 153.8 lb

## 2022-02-03 DIAGNOSIS — E11319 Type 2 diabetes mellitus with unspecified diabetic retinopathy without macular edema: Secondary | ICD-10-CM | POA: Insufficient documentation

## 2022-02-03 DIAGNOSIS — N401 Enlarged prostate with lower urinary tract symptoms: Secondary | ICD-10-CM | POA: Insufficient documentation

## 2022-02-03 DIAGNOSIS — D6481 Anemia due to antineoplastic chemotherapy: Secondary | ICD-10-CM | POA: Diagnosis not present

## 2022-02-03 DIAGNOSIS — Z5111 Encounter for antineoplastic chemotherapy: Secondary | ICD-10-CM | POA: Diagnosis not present

## 2022-02-03 DIAGNOSIS — K219 Gastro-esophageal reflux disease without esophagitis: Secondary | ICD-10-CM | POA: Insufficient documentation

## 2022-02-03 DIAGNOSIS — Z7952 Long term (current) use of systemic steroids: Secondary | ICD-10-CM | POA: Insufficient documentation

## 2022-02-03 DIAGNOSIS — I619 Nontraumatic intracerebral hemorrhage, unspecified: Secondary | ICD-10-CM | POA: Diagnosis not present

## 2022-02-03 DIAGNOSIS — I700 Atherosclerosis of aorta: Secondary | ICD-10-CM | POA: Insufficient documentation

## 2022-02-03 DIAGNOSIS — D518 Other vitamin B12 deficiency anemias: Secondary | ICD-10-CM

## 2022-02-03 DIAGNOSIS — R351 Nocturia: Secondary | ICD-10-CM | POA: Diagnosis not present

## 2022-02-03 DIAGNOSIS — Z79899 Other long term (current) drug therapy: Secondary | ICD-10-CM | POA: Insufficient documentation

## 2022-02-03 DIAGNOSIS — G4700 Insomnia, unspecified: Secondary | ICD-10-CM | POA: Insufficient documentation

## 2022-02-03 DIAGNOSIS — Z85820 Personal history of malignant melanoma of skin: Secondary | ICD-10-CM | POA: Diagnosis not present

## 2022-02-03 DIAGNOSIS — D519 Vitamin B12 deficiency anemia, unspecified: Secondary | ICD-10-CM | POA: Diagnosis not present

## 2022-02-03 DIAGNOSIS — C7931 Secondary malignant neoplasm of brain: Secondary | ICD-10-CM | POA: Insufficient documentation

## 2022-02-03 DIAGNOSIS — R9389 Abnormal findings on diagnostic imaging of other specified body structures: Secondary | ICD-10-CM | POA: Diagnosis not present

## 2022-02-03 DIAGNOSIS — G893 Neoplasm related pain (acute) (chronic): Secondary | ICD-10-CM | POA: Diagnosis not present

## 2022-02-03 DIAGNOSIS — M549 Dorsalgia, unspecified: Secondary | ICD-10-CM | POA: Diagnosis not present

## 2022-02-03 DIAGNOSIS — D75839 Thrombocytosis, unspecified: Secondary | ICD-10-CM | POA: Diagnosis not present

## 2022-02-03 DIAGNOSIS — R600 Localized edema: Secondary | ICD-10-CM | POA: Diagnosis not present

## 2022-02-03 DIAGNOSIS — C3412 Malignant neoplasm of upper lobe, left bronchus or lung: Secondary | ICD-10-CM | POA: Diagnosis not present

## 2022-02-03 DIAGNOSIS — C7951 Secondary malignant neoplasm of bone: Secondary | ICD-10-CM | POA: Insufficient documentation

## 2022-02-03 DIAGNOSIS — I2699 Other pulmonary embolism without acute cor pulmonale: Secondary | ICD-10-CM | POA: Insufficient documentation

## 2022-02-03 DIAGNOSIS — Z5112 Encounter for antineoplastic immunotherapy: Secondary | ICD-10-CM | POA: Insufficient documentation

## 2022-02-03 DIAGNOSIS — E785 Hyperlipidemia, unspecified: Secondary | ICD-10-CM | POA: Insufficient documentation

## 2022-02-03 DIAGNOSIS — Z794 Long term (current) use of insulin: Secondary | ICD-10-CM | POA: Insufficient documentation

## 2022-02-03 DIAGNOSIS — Z87891 Personal history of nicotine dependence: Secondary | ICD-10-CM | POA: Diagnosis not present

## 2022-02-03 DIAGNOSIS — T451X5A Adverse effect of antineoplastic and immunosuppressive drugs, initial encounter: Secondary | ICD-10-CM

## 2022-02-03 LAB — CBC WITH DIFFERENTIAL/PLATELET
Abs Immature Granulocytes: 0.88 10*3/uL — ABNORMAL HIGH (ref 0.00–0.07)
Basophils Absolute: 0.1 10*3/uL (ref 0.0–0.1)
Basophils Relative: 1 %
Eosinophils Absolute: 0 10*3/uL (ref 0.0–0.5)
Eosinophils Relative: 0 %
HCT: 29.7 % — ABNORMAL LOW (ref 39.0–52.0)
Hemoglobin: 9.4 g/dL — ABNORMAL LOW (ref 13.0–17.0)
Immature Granulocytes: 8 %
Lymphocytes Relative: 8 %
Lymphs Abs: 0.8 10*3/uL (ref 0.7–4.0)
MCH: 32.2 pg (ref 26.0–34.0)
MCHC: 31.6 g/dL (ref 30.0–36.0)
MCV: 101.7 fL — ABNORMAL HIGH (ref 80.0–100.0)
Monocytes Absolute: 1.1 10*3/uL — ABNORMAL HIGH (ref 0.1–1.0)
Monocytes Relative: 11 %
Neutro Abs: 7.6 10*3/uL (ref 1.7–7.7)
Neutrophils Relative %: 72 %
Platelets: 520 10*3/uL — ABNORMAL HIGH (ref 150–400)
RBC: 2.92 MIL/uL — ABNORMAL LOW (ref 4.22–5.81)
RDW: 20.9 % — ABNORMAL HIGH (ref 11.5–15.5)
Smear Review: NORMAL
WBC: 10.5 10*3/uL (ref 4.0–10.5)
nRBC: 0.7 % — ABNORMAL HIGH (ref 0.0–0.2)

## 2022-02-03 LAB — COMPREHENSIVE METABOLIC PANEL
ALT: 29 U/L (ref 0–44)
AST: 33 U/L (ref 15–41)
Albumin: 3.2 g/dL — ABNORMAL LOW (ref 3.5–5.0)
Alkaline Phosphatase: 89 U/L (ref 38–126)
Anion gap: 10 (ref 5–15)
BUN: 20 mg/dL (ref 8–23)
CO2: 24 mmol/L (ref 22–32)
Calcium: 8.8 mg/dL — ABNORMAL LOW (ref 8.9–10.3)
Chloride: 98 mmol/L (ref 98–111)
Creatinine, Ser: 1.15 mg/dL (ref 0.61–1.24)
GFR, Estimated: >60 mL/min (ref >60)
Glucose, Bld: 250 mg/dL — ABNORMAL HIGH (ref 70–99)
Potassium: 4.2 mmol/L (ref 3.5–5.1)
Sodium: 132 mmol/L — ABNORMAL LOW (ref 135–145)
Total Bilirubin: 0.4 mg/dL (ref 0.3–1.2)
Total Protein: 6.5 g/dL (ref 6.5–8.1)

## 2022-02-03 LAB — RETIC PANEL
Immature Retic Fract: 30.4 % — ABNORMAL HIGH (ref 2.3–15.9)
RBC.: 2.88 MIL/uL — ABNORMAL LOW (ref 4.22–5.81)
Retic Count, Absolute: 154.4 10*3/uL (ref 19.0–186.0)
Retic Ct Pct: 5.4 % — ABNORMAL HIGH (ref 0.4–3.1)
Reticulocyte Hemoglobin: 39.1 pg (ref >27.9)

## 2022-02-03 LAB — IRON AND TIBC
Iron: 60 ug/dL (ref 45–182)
Saturation Ratios: 15 % — ABNORMAL LOW (ref 17.9–39.5)
TIBC: 391 ug/dL (ref 250–450)
UIBC: 331 ug/dL

## 2022-02-03 LAB — TSH: TSH: 1.438 u[IU]/mL (ref 0.350–4.500)

## 2022-02-03 LAB — FERRITIN: Ferritin: 704 ng/mL — ABNORMAL HIGH (ref 24–336)

## 2022-02-03 MED ORDER — PROCHLORPERAZINE MALEATE 10 MG PO TABS
10.0000 mg | ORAL_TABLET | Freq: Once | ORAL | Status: AC
  Administered 2022-02-03: 10 mg via ORAL
  Filled 2022-02-03: qty 1

## 2022-02-03 MED ORDER — SODIUM CHLORIDE 0.9 % IV SOLN
350.0000 mg/m2 | Freq: Once | INTRAVENOUS | Status: AC
  Administered 2022-02-03: 600 mg via INTRAVENOUS
  Filled 2022-02-03: qty 20

## 2022-02-03 MED ORDER — SODIUM CHLORIDE 0.9 % IV SOLN
Freq: Once | INTRAVENOUS | Status: AC
  Filled 2022-02-03: qty 250

## 2022-02-03 MED ORDER — CYANOCOBALAMIN 1000 MCG/ML IJ SOLN
1000.0000 ug | Freq: Once | INTRAMUSCULAR | Status: AC
  Administered 2022-02-03: 1000 ug via INTRAMUSCULAR
  Filled 2022-02-03: qty 1

## 2022-02-03 MED ORDER — HEPARIN SOD (PORK) LOCK FLUSH 100 UNIT/ML IV SOLN
500.0000 [IU] | Freq: Once | INTRAVENOUS | Status: AC | PRN
  Filled 2022-02-03: qty 5

## 2022-02-03 MED ORDER — ZOLEDRONIC ACID 4 MG/100ML IV SOLN
4.0000 mg | Freq: Once | INTRAVENOUS | Status: AC
  Administered 2022-02-03: 4 mg via INTRAVENOUS
  Filled 2022-02-03: qty 100

## 2022-02-03 MED ORDER — SODIUM CHLORIDE 0.9 % IV SOLN
200.0000 mg | Freq: Once | INTRAVENOUS | Status: AC
  Administered 2022-02-03: 200 mg via INTRAVENOUS
  Filled 2022-02-03: qty 8

## 2022-02-03 MED ORDER — HEPARIN SOD (PORK) LOCK FLUSH 100 UNIT/ML IV SOLN
INTRAVENOUS | Status: AC
  Administered 2022-02-03: 500 [IU]
  Filled 2022-02-03: qty 5

## 2022-02-03 NOTE — Assessment & Plan Note (Signed)
MRI brain shows persistent microbleeds.   Stop  Eliquis 2.5mg  BID.

## 2022-02-03 NOTE — Assessment & Plan Note (Signed)
continue  B12 injection every 6 weeks.

## 2022-02-03 NOTE — Assessment & Plan Note (Signed)
Chemotherapy plan as listed above 

## 2022-02-03 NOTE — Progress Notes (Signed)
Hematology/Oncology Progress note [phone] [phone]            Patient Care Team: Micheal Frizzle, MD as PCP - General (Family Medicine) Micheal Nab, RN as Oncology Nurse Navigator   ASSESSMENT & PLAN:   Cancer Staging  Primary non-small cell carcinoma of upper lobe of left lung [facility]) Staging form: Lung, AJCC 8th Edition - Clinical: Stage IV (cT2b, cN1, cM1) - Signed by Micheal Server, MD on 08/06/2021   Primary non-small cell carcinoma of upper lobe of left lung ([place]) Stage IV lung adenocarcinoma with brain and bone metastasis.  PET scan images were reviewed with patient.- good partial response.  Labs are reviewed and discussed with patient. Proceed with maintenance Alimta/Keytruda today. -dose reduce Alimta 310m/m2 Repeat CT chest abdomen pelvis w contrast was reviewed with patient. Stable disease. No enteritis Proceed with Alimta/Keytruda today  Intraparenchymal hemorrhage of brain (HCC) Hold Eliquis. Refer to neurology  Thrombocytosis Likely reactive. Monitor.   Encounter for antineoplastic chemotherapy Chemotherapy plan as listed above  [place] persistent [place]eds.   Stop  Eliquis 2.550mBID.   Metastasis to bone (HCC) Recommend Zometa every 4- 6 weeks. Recommend calcium supplementation.   Neoplasm related pain Back pain, MRI lumbar showed compression fracture Continue PRN percocet Recommend patient to avoid heavy lifting  Anemia due to antineoplastic chemotherapy Hemoglobin is  Decreased.due to chemotherapy.  Monitor closely  B12 deficiency anemia continue  B12 injection every 6 weeks.     Orders Placed This Encounter  Procedures   TSH    Standing Status:   Future    Standing Expiration Date:   04/09/2023   CBC with Differential    Standing Status:   Future    Standing Expiration Date:   04/09/2023   Comprehensive metabolic panel    Standing Status:   Future    Standing Expiration Date:    04/09/2023   T4    Standing Status:   Future    Standing Expiration Date:   04/09/2023   Amb Referral to Neuro Oncology    Referral Priority:   Routine    Referral Type:   Consultation    Referral Reason:   Specialty Services Required    Number of Visits Requested:   1   Follow up Lab MD Alimta keBeryle Flock3 weeks  All questions were answered. The patient knows to call the clinic with any problems, questions or concerns.  Micheal ServerMD, [name] [facility] 02/03/2022      CHIEF COMPLAINTS/REASON FOR VISIT:  non-small cell lung cancer  HISTORY OF PRESENTING ILLNESS:   Micheal Donovan a  [age].o.  male presents for follow up of Non-small cell lung cancer.  Oncology History Overview Note  Diagnosis: Stage IIB T2b N1 M0 adenocarcinoma of the LUL, poorly differentiated     [place] Initial Diagnosis   Primary non-small cell carcinoma of upper lobe of [facility]dizz[facility]gait changes. CT head showed acute to subacute intraparenchymal hemorrhage involving the left cerebellum.  Surrounding low-density vasogenic edema.  [facility] angiogram of the neck which showed bulky calcified plaques/stenosis of carotid artery, Followed by MRI brain. 06/12/2021, MRI of the brain showed no significant interval change in size of the left cerebellar intraparenchymal hematoma with unchanged  regional mass effect and partial effacement of fourth ventricle but no upstream hydrocephalus. There is no discernible enhancement to suggest underlying mass lesion, though acute blood products could mask enhancement.   06/12/2021 a chest x-ray showed a 4.4 cm left middle lobe. 06/13/2021, CT chest with contrast showed a 4.3 x 3.6 x 3.3 cm lobular spiculated mass in the posterior  left upper lobe with T3 to the lateral pleura and major fissure.  Metastatic left hilar lymphadenopathy.  Peripheral micronodularity posterior right costophrenic sulcus.  Aortic atherosclerosis. 06/14/2021, CT abdomen pelvis showed right lower lobe pulmonary artery embolus.  No evidence of right heart strain.  No acute intra-abdominal or pelvic pathology.  Aortic atherosclerosis.  06/13/2021, patient underwent bronchoscopy with EBUS by Dr. Tamala Julian.  Biopsy from the fine-needle aspiration of station 11 mL showed malignant cells, consistent with poorly differentiated non-small cell carcinoma, consistent with adenocarcinoma.  Malignant cells are TTF-1 positive and negative for p40.  Negative for neuroendocrine markers.  Blood test and tissue sample were tested for Gardant 360  -PD-L1 TPS 97%, no actionable mutation on the blood testing. 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   07/17/2021 Cancer Staging   Staging form: Lung, AJCC 8th Edition - Clinical: Stage IV (cT2b, cN1, cM1) - Signed by Micheal Server, MD on 08/06/2021   08/06/2021 Imaging   I saw patient's PET scan after his visit with me on 08/06/21. PET scan was ordered by his previous oncologist group and did not come to my in basket.. Patient received cycle 1 carboplatin Taxol today.  He has started on radiation.  5/26/3 PET scan showed 3.8 cm hypermetabolic left upper lobe mass with hypermetabolic left hilar and infrahilar adenopathy.  There is approximately 8 scattered metastatic lesions in the skeleton. Mixed density photopenic lesion in the left cerebellum. characterized as hemorrhage on recent prior imaging workups.    08/06/2021 - 08/06/2021 Chemotherapy   Patient is on Treatment Plan : LUNG Carboplatin / Paclitaxel + XRT q7d     08/16/2021 -  Chemotherapy   Switched to systemic chemotherapy with  Carboplatin (4.5) + Pemetrexed (400) + Pembrolizumab (200) D1 q21d Induction x 4 cycles      08/16/2021 -  Chemotherapy   Patient is on  Treatment Plan : LUNG Carboplatin (5) + Pemetrexed + Pembrolizumab (200) D1 q21d Induction x 4 cycles / Maintenance Pemetrexed + Pembrolizumab (200) D1 q21d     08/22/2021 Imaging   MRI brain w wo contrast  Decrease in size of left cerebellar hematoma with resolution of edema. No evidence of underlying lesion. Smaller, more recent hemorrhage in the left cerebellar vermis with minimal edema. There is minimal enhancement without definite evidence of underlying lesion.  New punctate focus of chronic blood products and enhancement in the left frontal lobe. Additional new foci of chronic blood products in the posterior right putamen, right parietal subcortical white matter, and posterior right cerebellum. Unclear at this time if these represent foci of bland hemorrhage or early metastases.   Increase in size of right parietal osseous metastasis with minor extraosseous extension.    10/22/2021 - 10/22/2021 Chemotherapy   Patient is on Treatment Plan : LUNG Carboplatin (5) + Pemetrexed (500) + Pembrolizumab (200) D1 q21d Induction x 4 cycles / Maintenance Pemetrexed (500) + Pembrolizumab (200) D1 q21d      Imaging   PET scan showed 1. Interval response to therapy as evidenced by a small residual left upper lobe nodule with decreased hypermetabolism, no residual hypermetabolic adenopathy and decreased hypermetabolism  associatedwith osseous metastases. 2. 6 mm posterior left upper lobe nodule, likely stable. Recommend attention on follow-up. 3. Aortic atherosclerosis (ICD10-I70.0). Coronary artery calcification.   12/17/2021 Imaging   MRI lumbar spine w wo contrast  1. No evidence of regional metastatic disease. 2. Late subacute superior endplate fracture at L2 and large superior endplate Schmorl's node at L5, unchanged since the PET scan of 10/29/2021. 3. L3-4: Shallow disc protrusion. Facet and ligamentous hypertrophy. Stenosis of both lateral recesses. Findings slightly worsened since 2022. 4.  L4-5: Shallow disc protrusion. Facet and ligamentous hypertrophy. Small synovial cyst arising from the facet joint on the right. Stenosis of the lateral recesses right worse than left. Foraminal narrowing right worse than left. Findings have worsened since 2022.  5. L5-S1: Endplate osteophytes and shallow protrusion of the disc. Facet and ligamentous hypertrophy. Stenosis of the subarticular lateral recesses and neural foramina, right worse than left. Similar appearance to the study of 2022. 6. Continued evidence of enteritis of at least 1 loop of small bowel. Distended bladder present   12/18/2021 Imaging   [facility] MRI w wo  1. Interval decrease in size of the left cerebellar and vermian hematomas. No definite evidence of underlying metastatic lesion. 2. There is are two possible contrast enhancing lesions in the posterior left frontal lobe and left occipital lobe, which do not have intrinsic T1 signal abnormality, but have a somewhat linear appearance and may be vascular in nature. Recommend attention on follow up. 3. Multifocal sites of susceptibility artifact are redemonstrated with interval development of a few new foci, as described above. All of these sites demonstrate intrinsic T1 hyperintense signal abnormality and susceptibility artifact and are compatible with sites of microhemorrhages. Recommend continued attention on follow up. 4. Interval decrease in size of right parietal calvarial metastatic lesion. No new contrast enhancing lesions visualized. 5. Diffusely heterogeneous marrow signal throughout the cervical spine, which is nonspecific but can be seen in the setting of anemia, smoking, obesity, or a marrow replacement process.     12/18/2021 Imaging   MRI lumbar spine w wo contrast  1. No evidence of regional metastatic disease. 2. Late subacute superior endplate fracture at L2 and large superior endplate Schmorl's node at L5, unchanged since the PET scan of 10/29/2021 3. L3-4:  Shallow disc protrusion. Facet and ligamentous hypertrophy.Stenosis of both lateral recesses. Findings slightly worsened since 2022. 4. L4-5: Shallow disc protrusion. Facet and ligamentous hypertrophy. Small synovial cyst arising from the facet joint on the right. Stenosis of the lateral recesses right worse than left. Foraminal narrowing right worse than left. Findings have worsened since 2022. 5. L5-S1: Endplate osteophytes and shallow protrusion of the disc. Facet and ligamentous hypertrophy. Stenosis of the subarticular lateral recesses and neural foramina, right worse than left. Similar appearance to the study of 2022. 6. Continued evidence of enteritis of at least 1 loop of small bowel. Distended bladder present.       01/28/2022 Imaging   CT chest abdomen pelvis w contrast 1. Treated left upper lobe mass appears grossly stable to the prior examination when measured in a similar fashion on the prior study. No definite signs of extra skeletal metastatic disease noted in the chest, abdomen or pelvis. 2. Widespread skeletal metastases redemonstrated, as above. 3. Hepatic steatosis. 4. Aortic atherosclerosis, in addition to left main and three-vessel coronary artery disease. Assessment for potential risk factor modification, dietary therapy or pharmacologic therapy may be warranted, if clinically indicated. 5. Additional incidental findings,    # Patient has  a history of melanoma on his neck, treated in 2009. # History of hemorrhagic infarct left cerebellum  INTERVAL HISTORY Micheal Donovan is a 71 y.o. male who has above history reviewed by me today presents for follow up visit for management of  Stage IV lung adenocarcianoma Patient has gained weight.  Overall tolerates chemotherapy with mild to moderate difficulties.  Back pain, no benefit of RT per Radonc.  # Pulmonary embolism, SOB is stable, not worse. Off Eliquis due to MRI findings.  +chronic fatigue.  + Insomnia and nocturia.  On Ativan QHS.    Review of Systems  Constitutional:  Positive for fatigue. Negative for appetite change, chills, fever and unexpected weight change.  HENT:   Negative for hearing loss and voice change.   Eyes:  Negative for eye problems and icterus.  Respiratory:  Positive for shortness of breath. Negative for chest tightness and cough.   Cardiovascular:  Negative for chest pain and leg swelling.  Gastrointestinal:  Negative for abdominal distention, abdominal pain and blood in stool.  Endocrine: Negative for hot flashes.  Genitourinary:  Negative for difficulty urinating, dysuria and frequency.   Musculoskeletal:  Negative for arthralgias.  Skin:  Negative for itching and rash.  Neurological:  Negative for extremity weakness, headaches, light-headedness and numbness.  Hematological:  Negative for adenopathy. Does not bruise/bleed easily.  Psychiatric/Behavioral:  Positive for sleep disturbance. Negative for confusion.     MEDICAL HISTORY:  Past Medical History:  Diagnosis Date   Allergy    BPH (benign prostatic hypertrophy)    Cancer (HCC)    Melanoma on Neck    2008   Carotid artery occlusion    Diabetes mellitus    type 2   Micheal (erectile dysfunction)    GERD (gastroesophageal reflux disease)    Hemorrhagic stroke ([place]) 06/2021   Hyperlipidemia    Hypertension    Lung cancer ([place])    Retinopathy due to secondary DM ([place])     SURGICAL HISTORY: Past Surgical History:  Procedure Laterality Date   BRONCHIAL NEEDLE ASPIRATION BIOPSY  06/17/2021   Procedure: BRONCHIAL NEEDLE ASPIRATION BIOPSIES;  Surgeon: Candee Furbish, MD;  Location: [facility] ENDOSCOPY;  Service: Pulmonary;;   IR IMAGING GUIDED PORT INSERTION  08/05/2021   MELANOMA EXCISION  2008   Left side of neck   RADIOLOGY WITH ANESTHESIA N/A 04/05/2020   Procedure: MRI SPINE WITOUT CONTRAST;  Surgeon: Radiologist, Medication, MD;  Location: [place];  Service: Radiology;  Laterality: N/A;   RADIOLOGY WITH ANESTHESIA N/A  08/22/2021   Procedure: MRI BRAIN WITH AND WITHOUT CONTRAST  WITH ANESTHESIA;  Surgeon: Radiologist, Medication, MD;  Location: [place];  Service: Radiology;  Laterality: N/A;   RADIOLOGY WITH ANESTHESIA N/A 12/17/2021   Procedure: MRI BRAIN WITH AND WITHOUT CONTRAST WITH ANESTHESIA; MRI LUMBER WITH AND WITHOUT CONTRAST;  Surgeon: Radiologist, Medication, MD;  Location: [place];  Service: Radiology;  Laterality: N/A;   TONSILLECTOMY     VIDEO BRONCHOSCOPY WITH ENDOBRONCHIAL ULTRASOUND N/A 06/17/2021   Procedure: VIDEO BRONCHOSCOPY WITH ENDOBRONCHIAL ULTRASOUND;  Surgeon: Candee Furbish, MD;  Location: [facility] ENDOSCOPY;  Service: Pulmonary;  Laterality: N/A;    SOCIAL HISTORY: Social History   Socioeconomic History   Marital status: Married    Spouse name: Not on file   Number of children: Not on file   Years of education: Not on file   Highest education level: Not on file  Occupational History   Not on file  Tobacco Use   Smoking  status: Former    Types: Cigarettes    Quit date: 07/21/1990    Years since quitting: 31.5   Smokeless tobacco: Never  Vaping Use   Vaping Use: Never used  Substance and Sexual Activity   Alcohol use: No   Drug use: No   Sexual activity: Not on file  Other Topics Concern   Not on file  Social History Narrative   Not on file   Social Determinants of Health   Financial Resource Strain: Low Risk  (11/21/2021)   Overall Financial Resource Strain (CARDIA)    Difficulty of Paying Living Expenses: Not hard at all  Food Insecurity: No Food Insecurity (11/19/2021)   Hunger Vital Sign    Worried About Running Out of Food in the Last Year: Never true    Ran Out of Food in the Last Year: Never true  Transportation Needs: No Transportation Needs (11/21/2021)   PRAPARE - Hydrologist (Medical): No    Lack of Transportation (Non-Medical): No  Physical Activity: Inactive (07/31/2021)   Exercise Vital Sign    Days of Exercise per Week: 0 days     Minutes of Exercise per Session: 0 min  Stress: Unknown (07/31/2021)   [place]    Feeling of Stress : Patient refused  Social Connections: Unknown (07/31/2021)   Social Connection and Isolation Panel [NHANES]    Frequency of Communication with Friends and Family: Three times a week    Frequency of Social Gatherings with Friends and Family: Three times a week    Attends Religious Services: Patient refused    Active Member of Clubs or Organizations: Patient refused    Attends Archivist Meetings: Patient refused    Marital Status: Married  Human resources officer Violence: Not At Risk (07/31/2021)   Humiliation, Afraid, Rape, and Kick questionnaire    Fear of Current or Ex-Partner: No    Emotionally Abused: No    Physically Abused: No    Sexually Abused: No    FAMILY HISTORY: Family History  Problem Relation Age of Onset   COPD Mother    Heart disease Father    Heart disease Brother        MI at age 22   Stroke Neg Hx     ALLERGIES:  is allergic to niaspan [niacin].  MEDICATIONS:  Current Outpatient Medications  Medication Sig Dispense Refill   Ca Phosphate-Cholecalciferol (CALCIUM WITH D3 PO) Take 1 capsule by mouth daily.     dexamethasone (DECADRON) 4 MG tablet Take 1 tab every 12 hours the day before pemetrexed chemo, then take 2 tabs once a day for 3 days starting the day after carboplatin. 30 tablet 1   LORazepam (ATIVAN) 1 MG tablet Take 1 tablet (1 mg total) by mouth at bedtime. 30 tablet 2   NOVOLOG FLEXPEN 100 UNIT/ML FlexPen Inject 0-10 Units into the skin daily as needed for high blood sugar (above 200). Sliding scale     omeprazole (PRILOSEC) 20 MG capsule Take 20 mg by mouth daily.     oxyCODONE-acetaminophen (PERCOCET) 7.5-325 MG tablet Take 1 tablet by mouth every 4 (four) hours as needed for severe pain. 60 tablet 0   predniSONE (DELTASONE) 20 MG tablet 3 tabs poqday 1-2, 2 tabs poqday  3-4, 1 tab poqday 5-6 12 tablet 0   tamsulosin (FLOMAX) 0.4 MG CAPS capsule TAKE 1 CAPSULE(0.4 MG) BY MOUTH DAILY 90 capsule 0   UNABLE TO  FIND PLEASE CHECK FASTING BLOOD GLUCOSE EVERY MORNING 1 each 0   No current facility-administered medications for this visit.   Facility-Administered Medications Ordered in Other Visits  Medication Dose Route Frequency Provider Last Rate Last Admin   heparin lock flush 100 UNIT/ML injection              PHYSICAL EXAMINATION:  Vitals:   02/03/22 1020  BP: (!) 146/73  Pulse: 70  Resp: 18  Temp: (!) 96.6 F (35.9 C)   Filed Weights   02/03/22 1020  Weight: 153 lb 12.8 oz (69.8 kg)    Physical Exam Constitutional:      General: He is not in acute distress.    Comments: Patient sits in the wheelchair  HENT:     Head: Normocephalic and atraumatic.  Eyes:     General: No scleral icterus. Cardiovascular:     Rate and Rhythm: Normal rate and regular rhythm.     Heart sounds: Normal heart sounds.  Pulmonary:     Effort: Pulmonary effort is normal. No respiratory distress.     Breath sounds: No wheezing.     Comments: Decreased breath sound bilaterally. Abdominal:     General: Bowel sounds are normal. There is no distension.     Palpations: Abdomen is soft.  Musculoskeletal:        General: No deformity. Normal range of motion.     Cervical back: Normal range of motion and neck supple.  Skin:    General: Skin is warm and dry.     Findings: No erythema or rash.  Neurological:     Mental Status: He is alert and oriented to person, place, and time. Mental status is at baseline.     Cranial Nerves: No cranial nerve deficit.     Coordination: Coordination normal.  Psychiatric:        Mood and Affect: Mood normal.     LABORATORY DATA:  I have reviewed the data as listed    Latest Ref Rng & Units 02/03/2022    9:58 AM 01/13/2022    9:02 AM 12/23/2021   10:22 AM  CBC  WBC 4.0 - 10.5 K/uL 10.5  7.6  10.2   Hemoglobin 13.0 - 17.0  g/dL 9.4  9.4  9.8   Hematocrit 39.0 - 52.0 % 29.7  29.7  30.3   Platelets 150 - 400 K/uL 520  382  368       Latest Ref Rng & Units 02/03/2022    9:58 AM 01/13/2022    9:02 AM 12/23/2021   10:22 AM  CMP  Glucose 70 - 99 mg/dL 250  128  290   BUN 8 - 23 mg/dL _0 Creatinine 0.61 - 1.24 mg/dL 1.15  1.00  0.96   Sodium 135 - 145 mmol/L 132  132  130   Potassium 3.5 - 5.1 mmol/L 4.2  3.9  3.9   Chloride 98 - 111 mmol/L 98  98  97   CO2 22 - 32 mmol/L _1 Calcium 8.9 - 10.3 mg/dL 8.8  8.7  8.7   Total Protein 6.5 - 8.1 g/dL 6.5  6.8  7.2   Total Bilirubin 0.3 - 1.2 mg/dL 0.4  0.3  0.2   Alkaline Phos 38 - 126 U/L 89  106  100   AST 15 - 41 U/L 33  45  31   ALT 0 - 44 U/L 29  38  19      Iron/TIBC/Ferritin/ %Sat    Component Value Date/Time   IRON 60 02/03/2022 0958   TIBC 391 02/03/2022 0958   FERRITIN 704 (H) 02/03/2022 0958   IRONPCTSAT 15 (L) 02/03/2022 9678      RADIOGRAPHIC STUDIES: I have personally reviewed the radiological images as listed and agreed with the findings in the report. CT CHEST ABDOMEN PELVIS W CONTRAST  Result Date: 01/28/2022 CLINICAL DATA:  71 year old male with history of non-small cell lung cancer. Restaging examination. * Tracking Code: BO * EXAM: CT CHEST, ABDOMEN, AND PELVIS WITH CONTRAST TECHNIQUE: Multidetector CT imaging of the chest, abdomen and pelvis was performed following the standard protocol during bolus administration of intravenous contrast. RADIATION DOSE REDUCTION: This exam was performed according to the departmental dose-optimization program which includes automated exposure control, adjustment of the mA and/or kV according to patient size and/or use of iterative reconstruction technique. CONTRAST:  133m OMNIPAQUE IOHEXOL 300 MG/ML  SOLN COMPARISON:  CT of the abdomen and pelvis 11/18/2021. PET-CT 10/29/2021. Chest CT 06/13/2021. FINDINGS: CT CHEST FINDINGS Cardiovascular: Heart size is normal. There is no  significant pericardial fluid, thickening or pericardial calcification. There is aortic atherosclerosis, as well as atherosclerosis of the great vessels of the mediastinum and the coronary arteries, including calcified atherosclerotic plaque in the left main, left anterior descending, left circumflex and right coronary arteries. Right internal jugular single-lumen Port-A-Cath with tip terminating at the superior cavoatrial junction Mediastinum/Nodes: No pathologically enlarged mediastinal or hilar lymph nodes. Prominent but nonenlarged left hilar lymph node measuring 7 mm (axial image 25 of series 2) noted. Esophagus is unremarkable in appearance. No axillary lymphadenopathy. Lungs/Pleura: Treated mass in the left upper lobe currently measures 3.9 x 2.2 cm and has spiculated margins with spiculations extending to the pleura both laterally and posteriorly, as well as a small amount of surrounding ground-glass attenuation and regional architectural distortion. No other definite new suspicious appearing pulmonary nodules or masses are noted. No acute consolidative airspace disease. No pleural effusions. Musculoskeletal: Sclerotic lesion in the left lateral aspect of the superior endplate of TL38(axial image 54 of series 2) measuring 2.1 x 1.1 cm, likely a healed metastasis. CT ABDOMEN PELVIS FINDINGS Hepatobiliary: Diffuse low attenuation throughout the hepatic parenchyma, indicative of a background of hepatic steatosis. No suspicious cystic or solid hepatic lesions. No intra or extrahepatic biliary ductal dilatation. Gallbladder is unremarkable in appearance. Pancreas: No pancreatic mass. No pancreatic ductal dilatation. No pancreatic or peripancreatic fluid collections or inflammatory changes. Spleen: Unremarkable. Adrenals/Urinary Tract: 1.8 cm low-attenuation lesion in the anterior aspect of the interpolar region of the left kidney (axial image 68 of series 2), compatible with a simple (Bosniak class 1) cyst. Other  subcentimeter low-attenuation lesions in both kidneys, too small to definitively characterize, but statistically likely to represent cysts; no imaging follow-up for any of these lesions is recommended at this time. No aggressive appearing renal lesions. Bilateral adrenal glands are normal in appearance. No hydroureteronephrosis. Urinary bladder is normal in appearance. Stomach/Bowel: The appearance of the stomach is normal. There is no pathologic dilatation of small bowel or colon. Normal appendix. Vascular/Lymphatic: Aortic atherosclerosis, without evidence of aneurysm or dissection in the abdominal or pelvic vasculature. No lymphadenopathy noted in the abdomen or pelvis. Reproductive: Prostate gland and seminal vesicles are unremarkable in appearance. Other: No significant volume of ascites.  No pneumoperitoneum. Musculoskeletal: Numerous predominantly lytic lesions are noted in the visualized skeleton, most evident in the superior endplate of L5 where there is a  large lytic lesion measuring 2.4 cm in diameter. Small lytic lesion also noted in the superior endplate of L2 where there appears to be pathologic compression resulting in 25% loss of anterior vertebral body height. Mixed lucency and sclerosis noted in the bony pelvis, most evident in the right iliac wing. IMPRESSION: 1. Treated left upper lobe mass appears grossly stable to the prior examination when measured in a similar fashion on the prior study. No definite signs of extra skeletal metastatic disease noted in the chest, abdomen or pelvis. 2. Widespread skeletal metastases redemonstrated, as above. 3. Hepatic steatosis. 4. Aortic atherosclerosis, in addition to left main and three-vessel coronary artery disease. Assessment for potential risk factor modification, dietary therapy or pharmacologic therapy may be warranted, if clinically indicated. 5. Additional incidental findings, as above. Electronically Signed   By: Vinnie Langton M.D.   On:  01/28/2022 07:40

## 2022-02-03 NOTE — Assessment & Plan Note (Signed)
Back pain, MRI lumbar showed compression fracture Continue PRN percocet Recommend patient to avoid heavy lifting

## 2022-02-03 NOTE — Assessment & Plan Note (Signed)
Likely reactive. Monitor.

## 2022-02-03 NOTE — Patient Instructions (Signed)
[facility] CANCER CTR AT [place]  Discharge Instructions: Thank you for choosing [facility] to provide your oncology and hematology care.  If you have a lab appointment with the [facility], please go directly to the [facility] and check in at the registration area.  Wear comfortable clothing and clothing appropriate for easy access to any Portacath or PICC line.   We strive to give you quality time with your provider. You may need to reschedule your appointment if you arrive late (15 or more minutes).  Arriving late affects you and other patients whose appointments are after yours.  Also, if you miss three or more appointments without notifying the office, you may be dismissed from the clinic at the provider's discretion.      For prescription refill requests, have your pharmacy contact our office and allow 72 hours for refills to be completed.    Today you received the following chemotherapy and/or immunotherapy agents Alimta ans Keytruda       To help prevent nausea and vomiting after your treatment, we encourage you to take your nausea medication as directed.  BELOW ARE SYMPTOMS THAT SHOULD BE REPORTED IMMEDIATELY: *FEVER GREATER THAN 100.4 F (38 C) OR HIGHER *CHILLS OR SWEATING *NAUSEA AND VOMITING THAT IS NOT CONTROLLED WITH YOUR NAUSEA MEDICATION *UNUSUAL SHORTNESS OF BREATH *UNUSUAL BRUISING OR BLEEDING *URINARY PROBLEMS (pain or burning when urinating, or frequent urination) *BOWEL PROBLEMS (unusual diarrhea, constipation, pain near the anus) TENDERNESS IN MOUTH AND THROAT WITH OR WITHOUT PRESENCE OF ULCERS (sore throat, sores in mouth, or a toothache) UNUSUAL RASH, SWELLING OR PAIN  UNUSUAL VAGINAL DISCHARGE OR ITCHING   Items with * indicate a potential emergency and should be followed up as soon as possible or go to the Emergency Department if any problems should occur.  Please show the CHEMOTHERAPY ALERT CARD or IMMUNOTHERAPY ALERT CARD at  check-in to the Emergency Department and triage nurse.  Should you have questions after your visit or need to cancel or reschedule your appointment, please contact [facility] CANCER [place] AT [place]  [phone] and follow the prompts.  Office hours are 8:00 a.m. to 4:30 p.m. Monday - Friday. Please note that voicemails left after 4:00 p.m. may not be returned until the following business day.  We are closed weekends and major holidays. You have access to a nurse at all times for urgent questions. Please call the main number to the clinic [phone] and follow the prompts.  For any non-urgent questions, you may also contact your provider using MyChart. We now offer e-Visits for anyone 62 and older to request care online for non-urgent symptoms. For details visit [email or web address].   Also download the MyChart app! Go to the app store, search "MyChart", open the app, select [facility], and log in with your MyChart username and password.  Masks are optional in the cancer centers. If you would like for your care team to wear a mask while they are taking care of you, please let them know. For doctor visits, patients may have with them one support person who is at least 71 years old. At this time, visitors are not allowed in the infusion area.

## 2022-02-03 NOTE — Progress Notes (Signed)
Pt here for follow up. No new concerns voiced.   

## 2022-02-03 NOTE — Assessment & Plan Note (Addendum)
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  PET scan images were reviewed with patient.- good partial response.  Labs are reviewed and discussed with patient. Proceed with maintenance Alimta/Keytruda today. -dose reduce Alimta 350mg /m2 Repeat CT chest abdomen pelvis w contrast was reviewed with patient. Stable disease. No enteritis Proceed with Alimta/Keytruda today

## 2022-02-03 NOTE — Assessment & Plan Note (Signed)
Hold Eliquis. Refer to neurology

## 2022-02-03 NOTE — Assessment & Plan Note (Signed)
Recommend Zometa every 4- 6 weeks. Recommend calcium supplementation.

## 2022-02-03 NOTE — Assessment & Plan Note (Signed)
Hemoglobin is  Decreased.due to chemotherapy.  Monitor closely

## 2022-02-04 DIAGNOSIS — H0288B Meibomian gland dysfunction left eye, upper and lower eyelids: Secondary | ICD-10-CM | POA: Diagnosis not present

## 2022-02-04 DIAGNOSIS — H524 Presbyopia: Secondary | ICD-10-CM | POA: Diagnosis not present

## 2022-02-04 DIAGNOSIS — H25013 Cortical age-related cataract, bilateral: Secondary | ICD-10-CM | POA: Diagnosis not present

## 2022-02-04 DIAGNOSIS — H0288A Meibomian gland dysfunction right eye, upper and lower eyelids: Secondary | ICD-10-CM | POA: Diagnosis not present

## 2022-02-04 DIAGNOSIS — H2513 Age-related nuclear cataract, bilateral: Secondary | ICD-10-CM | POA: Diagnosis not present

## 2022-02-04 DIAGNOSIS — E113293 Type 2 diabetes mellitus with mild nonproliferative diabetic retinopathy without macular edema, bilateral: Secondary | ICD-10-CM | POA: Diagnosis not present

## 2022-02-04 LAB — T4: T4, Total: 6 ug/dL (ref 4.5–12.0)

## 2022-02-10 DIAGNOSIS — M5416 Radiculopathy, lumbar region: Secondary | ICD-10-CM | POA: Diagnosis not present

## 2022-02-11 ENCOUNTER — Ambulatory Visit (INDEPENDENT_AMBULATORY_CARE_PROVIDER_SITE_OTHER): Payer: PPO | Admitting: Family Medicine

## 2022-02-11 VITALS — BP 124/82 | HR 92 | Ht 67.0 in | Wt 144.8 lb

## 2022-02-11 DIAGNOSIS — I2699 Other pulmonary embolism without acute cor pulmonale: Secondary | ICD-10-CM

## 2022-02-11 DIAGNOSIS — D692 Other nonthrombocytopenic purpura: Secondary | ICD-10-CM

## 2022-02-11 MED ORDER — HYDROCODONE BIT-HOMATROP MBR 5-1.5 MG/5ML PO SOLN: 5.0000 mL | Freq: Three times a day (TID) | ORAL | 0 refills | Status: DC | PRN

## 2022-02-11 NOTE — Progress Notes (Signed)
Subjective:    Patient ID: Micheal Donovan, male    DOB: April 08, 1950, 71 y.o.   MRN: [id number]  HPI   Patient is a 71 year old gentleman lung cancer.  Recently is up to problems lower legs.  Please see the photograph included above.  He denies any pain in his legs.  He denies any itching although he does have some burning.  With the photograph above shows his lower left leg just above the ankle.  His right leg is similar.  He does have some purpura on the dorsum of his right forearm.  He denies any hemoptysis or epistaxis or melena or hematochezia or hemoptysis Past Medical History:  Diagnosis Date   Allergy    BPH (benign prostatic hypertrophy)    Cancer (HCC)    Melanoma on Neck    2008   Carotid artery occlusion    Diabetes mellitus    type 2   ED (erectile dysfunction)    GERD (gastroesophageal reflux disease)    Hemorrhagic stroke ([place]) 06/2021   Hyperlipidemia    Hypertension    Lung cancer ([place])    Retinopathy due to secondary DM [facility])    Past Surgical History:  Procedure Laterality Date   BRONCHIAL NEEDLE ASPIRATION BIOPSY  06/17/2021   Procedure: BRONCHIAL NEEDLE ASPIRATION BIOPSIES;  Surgeon: Candee Furbish, MD;  Location: [facility] ENDOSCOPY;  Service: Pulmonary;;   IR IMAGING GUIDED PORT INSERTION  08/05/2021   MELANOMA EXCISION  2008   Left side of neck   RADIOLOGY WITH ANESTHESIA N/A 04/05/2020   Procedure: MRI SPINE WITOUT CONTRAST;  Surgeon: Radiologist, Medication, MD;  Location: [facility];  Service: Radiology;  Laterality: N/A;   RADIOLOGY WITH ANESTHESIA N/A 08/22/2021   Procedure: MRI BRAIN WITH AND WITHOUT CONTRAST  WITH ANESTHESIA;  Surgeon: Radiologist, Medication, MD;  Location: [facility];  Service: Radiology;  Laterality: N/A;   RADIOLOGY WITH ANESTHESIA N/A 12/17/2021   Procedure: MRI BRAIN WITH AND WITHOUT CONTRAST WITH ANESTHESIA; MRI LUMBER WITH AND WITHOUT CONTRAST;  Surgeon: Radiologist, Medication, MD;  Location: [facility];  Service: Radiology;  Laterality: N/A;    TONSILLECTOMY     VIDEO BRONCHOSCOPY WITH ENDOBRONCHIAL ULTRASOUND N/A 06/17/2021   Procedure: VIDEO BRONCHOSCOPY WITH ENDOBRONCHIAL ULTRASOUND;  Surgeon: Candee Furbish, MD;  Location: [facility] ENDOSCOPY;  Service: Pulmonary;  Laterality: N/A;   Current Outpatient Medications on File Prior to Visit  Medication Sig Dispense Refill   Ca Phosphate-Cholecalciferol (CALCIUM WITH D3 PO) Take 1 capsule by mouth daily.     dexamethasone (DECADRON) 4 MG tablet Take 1 tab every 12 hours the day before pemetrexed chemo, then take 2 tabs once a day for 3 days starting the day after carboplatin. 30 tablet 1   LORazepam (ATIVAN) 1 MG tablet Take 1 tablet (1 mg total) by mouth at bedtime. 30 tablet 2   NOVOLOG FLEXPEN 100 UNIT/ML FlexPen Inject 0-10 Units into the skin daily as needed for high blood sugar (above 200). Sliding scale     omeprazole (PRILOSEC) 20 MG capsule Take 20 mg by mouth daily.     oxyCODONE-acetaminophen (PERCOCET) 7.5-325 MG tablet Take 1 tablet by mouth every 4 (four) hours as needed for severe pain. 60 tablet 0   predniSONE (DELTASONE) 20 MG tablet 3 tabs poqday 1-2, 2 tabs poqday 3-4, 1 tab poqday 5-6 12 tablet 0   tamsulosin (FLOMAX) 0.4 MG CAPS capsule TAKE 1 CAPSULE(0.4 MG) BY MOUTH DAILY 90 capsule 0   UNABLE TO FIND PLEASE CHECK FASTING  BLOOD GLUCOSE EVERY MORNING 1 each 0   [DISCONTINUED] prochlorperazine (COMPAZINE) 10 MG tablet Take 1 tablet (10 mg total) by mouth every 6 (six) hours as needed (Nausea or vomiting). 30 tablet 1   Current Facility-Administered Medications on File Prior to Visit  Medication Dose Route Frequency Provider Last Rate Last Admin   heparin lock flush 100 UNIT/ML injection            Allergies  Allergen Reactions   Niaspan [Niacin]     FLUSHING   Social History   Socioeconomic History   Marital status: Married    Spouse name: Not on file   Number of children: Not on file   Years of education: Not on file   Highest education level: Not on file   Occupational History   Not on file  Tobacco Use   Smoking status: Former    Types: Cigarettes    Quit date: 07/21/1990    Years since quitting: 31.5   Smokeless tobacco: Never  Vaping Use   Vaping Use: Never used  Substance and Sexual Activity   Alcohol use: No   Drug use: No   Sexual activity: Not on file  Other Topics Concern   Not on file  Social History Narrative   Not on file   Social Determinants of Health   Financial Resource Strain: Low Risk  (11/21/2021)   Overall Financial Resource Strain (CARDIA)    Difficulty of Paying Living Expenses: Not hard at all  Food Insecurity: No Food Insecurity (11/19/2021)   Hunger Vital Sign    Worried About Running Out of Food in the Last Year: Never true    [place] in the Last Year: Never true  Transportation Needs: No Transportation Needs (11/21/2021)   PRAPARE - Hydrologist (Medical): No    Lack of Transportation (Non-Medical): No  Physical Activity: Inactive (07/31/2021)   Exercise Vital Sign    Days of Exercise per Week: 0 days    Minutes of Exercise per Session: 0 min  Stress: Unknown (07/31/2021)   [place]    Feeling of Stress : Patient refused  Social Connections: Unknown (07/31/2021)   Social Connection and Isolation Panel [NHANES]    Frequency of Communication with Friends and Family: Three times a week    Frequency of Social Gatherings with Friends and Family: Three times a week    Attends Religious Services: Patient refused    Active Member of Clubs or Organizations: Patient refused    Attends Archivist Meetings: Patient refused    Marital Status: Married  Human resources officer Violence: Not At Risk (07/31/2021)   Humiliation, Afraid, Rape, and Kick questionnaire    Fear of Current or Ex-Partner: No    Emotionally Abused: No    Physically Abused: No    Sexually Abused: No      Review of Systems  All  other systems reviewed and are negative.      Objective:   Physical Exam Vitals reviewed.  Cardiovascular:     Rate and Rhythm: Normal rate and regular rhythm.     Heart sounds: Normal heart sounds.  Pulmonary:     Effort: Pulmonary effort is normal.     Breath sounds: Normal breath sounds. No wheezing or rales.  Skin:    Findings: Petechiae and rash present.                Assessment &  Plan:  Purpura ([place]) - Plan: CBC with Differential/Platelet, COMPLETE METABOLIC PANEL WITH GFR, PT with INR/Fingerstick, APTT Patient has petechia and purpura on his lower legs bilaterally.  I will check a CBC to look for any thrombocytopenia.  I will check a CMP to monitor his liver function test and his albumin.  I will check a PT and PTT to evaluate for any deficiencies in his clotting factors.  If his bone marrow test and clotting factors are stable, I reassured the patient that these are benign lesions.  If there is a significant drop in his platelet count, may discuss with his oncologist his treatment for his lung cancer.

## 2022-02-11 NOTE — Addendum Note (Signed)
Addended by: Randal Buba K on: 02/11/2022 02:52 PM   Modules accepted: Orders

## 2022-02-12 ENCOUNTER — Encounter: Payer: Self-pay | Admitting: Oncology

## 2022-02-12 LAB — COMPLETE METABOLIC PANEL WITHOUT GFR
AG Ratio: 1.3 (calc) (ref 1.0–2.5)
ALT: 57 U/L — ABNORMAL HIGH (ref 9–46)
AST: 73 U/L — ABNORMAL HIGH (ref 10–35)
Albumin: 3.5 g/dL — ABNORMAL LOW (ref 3.6–5.1)
Alkaline phosphatase (APISO): 109 U/L (ref 35–144)
BUN: 15 mg/dL (ref 7–25)
CO2: 24 mmol/L (ref 20–32)
Calcium: 8.5 mg/dL — ABNORMAL LOW (ref 8.6–10.3)
Chloride: 102 mmol/L (ref 98–110)
Creat: 1.07 mg/dL (ref 0.70–1.28)
Globulin: 2.7 g/dL (ref 1.9–3.7)
Glucose, Bld: 225 mg/dL — ABNORMAL HIGH (ref 65–99)
Potassium: 4.8 mmol/L (ref 3.5–5.3)
Sodium: 138 mmol/L (ref 135–146)
Total Bilirubin: 0.5 mg/dL (ref 0.2–1.2)
Total Protein: 6.2 g/dL (ref 6.1–8.1)
eGFR: 74 mL/min/{1.73_m2}

## 2022-02-12 LAB — CBC WITH DIFFERENTIAL/PLATELET
Absolute Monocytes: 96 cells/uL — ABNORMAL LOW (ref 200–950)
Basophils Absolute: 10 cells/uL (ref 0–200)
Basophils Relative: 1.2 %
Eosinophils Absolute: 10 cells/uL — ABNORMAL LOW (ref 15–500)
Eosinophils Relative: 1.2 %
HCT: 27.3 % — ABNORMAL LOW (ref 38.5–50.0)
Hemoglobin: 9.1 g/dL — ABNORMAL LOW (ref 13.2–17.1)
Lymphs Abs: 222 cells/uL — ABNORMAL LOW (ref 850–3900)
MCH: 33.3 pg — ABNORMAL HIGH (ref 27.0–33.0)
MCHC: 33.3 g/dL (ref 32.0–36.0)
MCV: 100 fL (ref 80.0–100.0)
MPV: 10.7 fL (ref 7.5–12.5)
Monocytes Relative: 12 %
Neutro Abs: 463 cells/uL — CL (ref 1500–7800)
Neutrophils Relative %: 57.9 %
Platelets: 89 10*3/uL — ABNORMAL LOW (ref 140–400)
RBC: 2.73 10*6/uL — ABNORMAL LOW (ref 4.20–5.80)
RDW: 18.1 % — ABNORMAL HIGH (ref 11.0–15.0)
Total Lymphocyte: 27.7 %
WBC: 0.8 10*3/uL — ABNORMAL LOW (ref 3.8–10.8)

## 2022-02-12 LAB — PROTIME-INR
INR: 1
Prothrombin Time: 10.2 s (ref 9.0–11.5)

## 2022-02-12 LAB — APTT: aPTT: 30 s (ref 23–32)

## 2022-02-13 ENCOUNTER — Telehealth: Payer: Self-pay

## 2022-02-13 ENCOUNTER — Other Ambulatory Visit: Payer: Self-pay | Admitting: Oncology

## 2022-02-13 ENCOUNTER — Ambulatory Visit
Admission: RE | Admit: 2022-02-13 | Discharge: 2022-02-13 | Disposition: A | Discharge status: Home / Self Care | Payer: PPO | Attending: Oncology | Admitting: Oncology

## 2022-02-13 ENCOUNTER — Inpatient Hospital Stay: Payer: PPO

## 2022-02-13 ENCOUNTER — Other Ambulatory Visit: Payer: Self-pay

## 2022-02-13 ENCOUNTER — Ambulatory Visit
Admission: RE | Admit: 2022-02-13 | Discharge: 2022-02-13 | Disposition: A | Discharge status: Home / Self Care | Payer: PPO | Source: Ambulatory Visit | Attending: Oncology | Admitting: Oncology

## 2022-02-13 ENCOUNTER — Ambulatory Visit: Payer: PPO | Admitting: Family Medicine

## 2022-02-13 ENCOUNTER — Inpatient Hospital Stay (HOSPITAL_BASED_OUTPATIENT_CLINIC_OR_DEPARTMENT_OTHER): Payer: PPO | Admitting: Oncology

## 2022-02-13 ENCOUNTER — Encounter: Payer: Self-pay | Admitting: Oncology

## 2022-02-13 VITALS — BP 137/76 | HR 98 | Temp 99.1°F | Resp 18 | Wt 147.7 lb

## 2022-02-13 DIAGNOSIS — C3412 Malignant neoplasm of upper lobe, left bronchus or lung: Secondary | ICD-10-CM

## 2022-02-13 DIAGNOSIS — T451X5A Adverse effect of antineoplastic and immunosuppressive drugs, initial encounter: Secondary | ICD-10-CM

## 2022-02-13 DIAGNOSIS — M25473 Effusion, unspecified ankle: Secondary | ICD-10-CM

## 2022-02-13 DIAGNOSIS — D75839 Thrombocytosis, unspecified: Secondary | ICD-10-CM | POA: Insufficient documentation

## 2022-02-13 DIAGNOSIS — R051 Acute cough: Secondary | ICD-10-CM | POA: Insufficient documentation

## 2022-02-13 DIAGNOSIS — D701 Agranulocytosis secondary to cancer chemotherapy: Secondary | ICD-10-CM

## 2022-02-13 DIAGNOSIS — D696 Thrombocytopenia, unspecified: Secondary | ICD-10-CM

## 2022-02-13 DIAGNOSIS — M7989 Other specified soft tissue disorders: Secondary | ICD-10-CM

## 2022-02-13 DIAGNOSIS — I2699 Other pulmonary embolism without acute cor pulmonale: Secondary | ICD-10-CM

## 2022-02-13 DIAGNOSIS — Z5111 Encounter for antineoplastic chemotherapy: Secondary | ICD-10-CM | POA: Diagnosis not present

## 2022-02-13 DIAGNOSIS — R059 Cough, unspecified: Secondary | ICD-10-CM | POA: Diagnosis not present

## 2022-02-13 LAB — CBC WITH DIFFERENTIAL/PLATELET
Abs Immature Granulocytes: 0.02 10*3/uL (ref 0.00–0.07)
Basophils Absolute: 0 10*3/uL (ref 0.0–0.1)
Basophils Relative: 0 %
Eosinophils Absolute: 0 10*3/uL (ref 0.0–0.5)
Eosinophils Relative: 1 %
HCT: 24.5 % — ABNORMAL LOW (ref 39.0–52.0)
Hemoglobin: 8.2 g/dL — ABNORMAL LOW (ref 13.0–17.0)
Immature Granulocytes: 2 %
Lymphocytes Relative: 32 %
Lymphs Abs: 0.4 10*3/uL — ABNORMAL LOW (ref 0.7–4.0)
MCH: 33.3 pg (ref 26.0–34.0)
MCHC: 33.5 g/dL (ref 30.0–36.0)
MCV: 99.6 fL (ref 80.0–100.0)
Monocytes Absolute: 0.2 10*3/uL (ref 0.1–1.0)
Monocytes Relative: 21 %
Neutro Abs: 0.5 10*3/uL — ABNORMAL LOW (ref 1.7–7.7)
Neutrophils Relative %: 44 %
Platelets: 62 10*3/uL — ABNORMAL LOW (ref 150–400)
RBC: 2.46 MIL/uL — ABNORMAL LOW (ref 4.22–5.81)
RDW: 17.4 % — ABNORMAL HIGH (ref 11.5–15.5)
WBC: 1.1 10*3/uL — CL (ref 4.0–10.5)
nRBC: 0 % (ref 0.0–0.2)

## 2022-02-13 LAB — BRAIN NATRIURETIC PEPTIDE: B Natriuretic Peptide: 153.8 pg/mL — ABNORMAL HIGH (ref 0.0–100.0)

## 2022-02-13 MED ORDER — FILGRASTIM-SNDZ 300 MCG/0.5ML IJ SOSY
300.0000 ug | PREFILLED_SYRINGE | Freq: Once | INTRAMUSCULAR | Status: AC
  Administered 2022-02-13: 300 ug via SUBCUTANEOUS
  Filled 2022-02-13: qty 0.5

## 2022-02-13 MED ORDER — SODIUM CHLORIDE 0.9% FLUSH
3.0000 mL | Freq: Once | INTRAVENOUS | Status: DC | PRN
  Filled 2022-02-13: qty 3

## 2022-02-13 MED ORDER — FILGRASTIM-SNDZ 480 MCG/0.8ML IJ SOSY: 480.0000 ug | PREFILLED_SYRINGE | Freq: Once | INTRAMUSCULAR | Status: DC

## 2022-02-13 MED ORDER — ALTEPLASE 2 MG IJ SOLR
2.0000 mg | Freq: Once | INTRAMUSCULAR | Status: DC | PRN
  Filled 2022-02-13: qty 2

## 2022-02-13 MED ORDER — HEPARIN SOD (PORK) LOCK FLUSH 100 UNIT/ML IV SOLN
250.0000 [IU] | Freq: Once | INTRAVENOUS | Status: DC | PRN
  Filled 2022-02-13: qty 5

## 2022-02-13 MED ORDER — SODIUM CHLORIDE 0.9% FLUSH
10.0000 mL | Freq: Once | INTRAVENOUS | Status: DC | PRN
  Filled 2022-02-13: qty 10

## 2022-02-13 MED ORDER — SODIUM CHLORIDE 0.9 % IV SOLN
Freq: Once | INTRAVENOUS | Status: DC
  Filled 2022-02-13: qty 250

## 2022-02-13 MED ORDER — HEPARIN SOD (PORK) LOCK FLUSH 100 UNIT/ML IV SOLN
500.0000 [IU] | Freq: Once | INTRAVENOUS | Status: DC | PRN
  Filled 2022-02-13: qty 5

## 2022-02-13 NOTE — Telephone Encounter (Signed)
Per message from Dr. Tasia Catchings: "his PCP reached out, he has ANC 0.8, please arrange him to repeat lab today or tomorrow, +/- Granix. once his count is up, we will need to get long actiing GCSF to be given with his next chemo."   Contacted pt to inform him of this and he states that he is having a cough, cold and his legs are swollen. Pt added to today's schedule for lab/MD and poss zarxio (auth pending). Pt advised to get chest xray done today prior to coming to appt.

## 2022-02-13 NOTE — Assessment & Plan Note (Signed)
ANC 0.5 today, Afebrile.  Recommend daily Zarxio 342mcg x2 Repeat cbc +/- Zarxio next week.  Rationale and potential risk were reviewed and discussed with patient. He agrees.  I recommend him to take Claritin 10mg  daily x 4 days.

## 2022-02-13 NOTE — Assessment & Plan Note (Addendum)
History of mild decrease of LVEF 45 to 50%  BNP is elevated.  I will discuss with his PCP about cardiology evaluation.

## 2022-02-13 NOTE — Progress Notes (Signed)
Pt here for follow up. No new concerns voiced.   

## 2022-02-13 NOTE — Assessment & Plan Note (Signed)
Chemotherapy induced.  Platelet count is >50,000, close monitor

## 2022-02-13 NOTE — Assessment & Plan Note (Signed)
MRI brain shows persistent microbleeds.   Stop  Eliquis 2.5mg  BID.

## 2022-02-13 NOTE — Progress Notes (Signed)
Hematology/Oncology Progress note [phone] [phone]            Patient Care Team: [name], MD as PCP - General (Family Medicine) [name], RN as Oncology Nurse Navigator   ASSESSMENT & PLAN:   Cancer Staging  Primary non-small cell carcinoma of upper lobe of left lung [facility]) Staging form: Lung, AJCC 8th Edition - Clinical: Stage IV (cT2b, cN1, cM1) - Signed by [name], MD on 08/06/2021   Chemotherapy induced neutropenia ([place]) ANC 0.5 today, Afebrile.  Recommend daily Zarxio 3754mg x2 Repeat cbc +/- Zarxio next week.  Rationale and potential risk were reviewed and discussed with patient. He agrees.  I recommend him to take Claritin 166mdaily x 4 days.   Pulmonary embolus (HCC) MRI brain shows persistent microbleeds.   Stop  Eliquis 2.54m110mID.   Thrombocytopenia ([facility]CC[place]hemotherapy induced.  Platelet count is >50,000, close monitor  Ankle swelling History of mild decrease of LVEF 45 to 50%  BNP is elevated.  I will discuss with his PCP about cardiology evaluation.     Orders Placed This Encounter  Procedures   CBC with Differential/Platelet    Standing Status:   Future    Standing Expiration Date:   02/14/2023   Follow up Per LOS  All questions were answered. The patient knows to call the clinic with any problems, questions or concerns.  [name], [name] [facility] 02/13/2022      CHIEF COMPLAINTS/REASON FOR VISIT:  non-small cell lung cancer  HISTORY OF PRESENTING ILLNESS:   Micheal Donovan a  71 28o.  male presents for follow up of Non-small cell lung cancer.  Oncology History Overview Note  Diagnosis: Stage IIB T2b N1 M0 adenocarcinoma of the LUL, poorly differentiated     Primary non-small cell carcinoma of upper lobe of left lung ([place] Diagnosis   [facility]dizziness/gait changes. CT head showed acute to subacute intraparenchymal hemorrhage involving the left cerebellum.  Surrounding low-density vasogenic edema.  [facility] of [facility] neck which showed bulky calcified plaques/stenosis of carotid artery, Followed by MRI brain. 06/12/2021, MRI of the brain showed no significant interval change in size of the left cerebellar intraparenchymal hematoma with unchanged regional mass effect and partial effacement of fourth ventricle but no upstream hydrocephalus. There is no discernible enhancement to suggest underlying mass lesion, though acute blood products could mask enhancement.   06/12/2021 a chest x-ray showed a 4.4 cm left middle lobe. 06/13/2021, CT chest with contrast showed a 4.3 x 3.6 x 3.3 cm lobular spiculated mass in the posterior left upper lobe with T3 to the lateral pleura and major fissure.  Metastatic left hilar lymphadenopathy.  Peripheral micronodularity posterior right costophrenic sulcus.  Aortic atherosclerosis. 06/14/2021, CT abdomen pelvis showed right lower lobe pulmonary artery embolus.  No evidence of right heart strain.  No acute intra-abdominal or pelvic pathology.  Aortic atherosclerosis.  06/13/2021, patient underwent bronchoscopy with EBUS by Dr. SmiTamala JulianBiopsy from the fine-needle aspiration of station 11 mL showed malignant cells, consistent with poorly differentiated non-small cell carcinoma, consistent with adenocarcinoma.  Malignant cells are TTF-1 positive and negative for p40.  Negative for neuroendocrine markers.  Blood test and tissue sample were tested for Gardant 360  -PD-L1 TPS 97%,  no actionable mutation on the blood testing. 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   07/17/2021 Cancer Staging   Staging form: Lung, AJCC 8th Edition - Clinical: Stage IV (cT2b, cN1, cM1) - Signed by [name],  MD on 08/06/2021   08/06/2021 Imaging   I saw patient's PET scan after his visit with me on 08/06/21. PET scan was ordered by his previous oncologist group and did not come to my in basket.. Patient received cycle 1 carboplatin Taxol today.  He has started on radiation.  5/26/3 PET scan showed 3.8 cm hypermetabolic left upper lobe mass with hypermetabolic left hilar and infrahilar adenopathy.  There is approximately 8 scattered metastatic lesions in the skeleton. Mixed density photopenic lesion in the left cerebellum. characterized as hemorrhage on recent prior imaging workups.    08/06/2021 - 08/06/2021 Chemotherapy   Patient is on Treatment Plan : LUNG Carboplatin / Paclitaxel + XRT q7d     08/16/2021 -  Chemotherapy   Switched to systemic chemotherapy with  Carboplatin (4.5) + Pemetrexed (400) + Pembrolizumab (200) D1 q21d Induction x 4 cycles      08/16/2021 -  Chemotherapy   Patient is on Treatment Plan : LUNG Carboplatin (5) + Pemetrexed + Pembrolizumab (200) D1 q21d Induction x 4 cycles / Maintenance Pemetrexed + Pembrolizumab (200) D1 q21d     08/22/2021 Imaging   MRI brain w wo contrast  Decrease in size of left cerebellar hematoma with resolution of edema. No evidence of underlying lesion. Smaller, more recent hemorrhage in the left cerebellar vermis with minimal edema. There is minimal enhancement without definite evidence of underlying lesion.  New punctate focus of chronic blood products and enhancement in the left frontal lobe. Additional new foci of chronic blood products in the posterior right putamen, right parietal subcortical white matter, and posterior right cerebellum. Unclear at this time if these represent foci of bland hemorrhage or early metastases.   Increase in size of right parietal osseous metastasis with minor extraosseous extension.    10/22/2021 - 10/22/2021 Chemotherapy   Patient is on Treatment Plan : LUNG Carboplatin (5) + Pemetrexed (500) + Pembrolizumab (200) D1 q21d  Induction x 4 cycles / Maintenance Pemetrexed (500) + Pembrolizumab (200) D1 q21d      Imaging   PET scan showed 1. Interval response to therapy as evidenced by a small residual left upper lobe nodule with decreased hypermetabolism, no residual hypermetabolic adenopathy and decreased hypermetabolism associatedwith osseous metastases. 2. 6 mm posterior left upper lobe nodule, likely stable. Recommend attention on follow-up. 3. Aortic atherosclerosis (ICD10-I70.0). Coronary artery calcification.   12/17/2021 Imaging   MRI lumbar spine w wo contrast  1. No evidence of regional metastatic disease. 2. Late subacute superior endplate fracture at L2 and large superior endplate Schmorl's node at L5, unchanged since the PET scan of 10/29/2021. 3. L3-4: Shallow disc protrusion. Facet and ligamentous hypertrophy. Stenosis of both lateral recesses. Findings slightly worsened since 2022. 4. L4-5: Shallow disc protrusion. Facet and ligamentous hypertrophy. Small synovial cyst arising from the facet joint on the right. Stenosis of the lateral recesses right worse than left. Foraminal narrowing right worse than left. Findings have worsened since 2022.  5. L5-S1: Endplate osteophytes and shallow protrusion of the disc. Facet and ligamentous hypertrophy. Stenosis of the subarticular lateral recesses and neural foramina, right worse than left. Similar appearance to the study of 2022. 6. Continued evidence of enteritis of at least 1 loop of small bowel. Distended bladder present   12/18/2021  Imaging   Brian MRI w wo  1. Interval decrease in size of the left cerebellar and vermian hematomas. No definite evidence of underlying metastatic lesion. 2. There is are two possible contrast enhancing lesions in the posterior left frontal lobe and left occipital lobe, which do not have intrinsic T1 signal abnormality, but have a somewhat linear appearance and may be vascular in nature. Recommend attention on follow up. 3.  Multifocal sites of susceptibility artifact are redemonstrated with interval development of a few new foci, as described above. All of these sites demonstrate intrinsic T1 hyperintense signal abnormality and susceptibility artifact and are compatible with sites of microhemorrhages. Recommend continued attention on follow up. 4. Interval decrease in size of right parietal calvarial metastatic lesion. No new contrast enhancing lesions visualized. 5. Diffusely heterogeneous marrow signal throughout the cervical spine, which is nonspecific but can be seen in the setting of anemia, smoking, obesity, or a marrow replacement process.     12/18/2021 Imaging   MRI lumbar spine w wo contrast  1. No evidence of regional metastatic disease. 2. Late subacute superior endplate fracture at L2 and large superior endplate Schmorl's node at L5, unchanged since the PET scan of 10/29/2021 3. L3-4: Shallow disc protrusion. Facet and ligamentous hypertrophy.Stenosis of both lateral recesses. Findings slightly worsened since 2022. 4. L4-5: Shallow disc protrusion. Facet and ligamentous hypertrophy. Small synovial cyst arising from the facet joint on the right. Stenosis of the lateral recesses right worse than left. Foraminal narrowing right worse than left. Findings have worsened since 2022. 5. L5-S1: Endplate osteophytes and shallow protrusion of the disc. Facet and ligamentous hypertrophy. Stenosis of the subarticular lateral recesses and neural foramina, right worse than left. Similar appearance to the study of 2022. 6. Continued evidence of enteritis of at least 1 loop of small bowel. Distended bladder present.       01/28/2022 Imaging   CT chest abdomen pelvis w contrast 1. Treated left upper lobe mass appears grossly stable to the prior examination when measured in a similar fashion on the prior study. No definite signs of extra skeletal metastatic disease noted in the chest, abdomen or pelvis. 2. Widespread  skeletal metastases redemonstrated, as above. 3. Hepatic steatosis. 4. Aortic atherosclerosis, in addition to left main and three-vessel coronary artery disease. Assessment for potential risk factor modification, dietary therapy or pharmacologic therapy may be warranted, if clinically indicated. 5. Additional incidental findings,    # Patient has a history of melanoma on his neck, treated in 2009. # History of hemorrhagic infarct left cerebellum  INTERVAL HISTORY Micheal Donovan is a 71 y.o. male who has above history reviewed by me today presents for follow up visit for management of  Stage IV lung adenocarcianoma He was found to have low ANC and thrombocytopenia by PCP.  He also reports having "cold symptoms" no fever or chills. Increased ankle swelling.  Acute visit for further evaluation.   # Pulmonary embolism, SOB is stable, not worse. Off Eliquis due to MRI findings.  + Insomnia and nocturia. On Ativan QHS.    Review of Systems  Constitutional:  Positive for fatigue. Negative for appetite change, chills, fever and unexpected weight change.  HENT:   Negative for hearing loss and voice change.   Eyes:  Negative for eye problems and icterus.  Respiratory:  Positive for shortness of breath. Negative for chest tightness and cough.   Cardiovascular:  Positive for leg swelling. Negative for chest pain.  Gastrointestinal:  Negative for abdominal distention,  abdominal pain and blood in stool.  Endocrine: Negative for hot flashes.  Genitourinary:  Negative for difficulty urinating, dysuria and frequency.   Musculoskeletal:  Negative for arthralgias.  Skin:  Negative for itching and rash.  Neurological:  Negative for extremity weakness, headaches, light-headedness and numbness.  Hematological:  Negative for adenopathy. Does not bruise/bleed easily.  Psychiatric/Behavioral:  Positive for sleep disturbance. Negative for confusion.     MEDICAL HISTORY:  Past Medical History:  Diagnosis  Date   Allergy    BPH (benign prostatic hypertrophy)    Cancer (HCC)    Melanoma on Neck    2008   Carotid artery occlusion    Diabetes mellitus    type 2   ED (erectile dysfunction)    GERD (gastroesophageal reflux disease)    Hemorrhagic stroke ([place]) 06/2021   Hyperlipidemia    Hypertension    Lung cancer ([place])    Retinopathy due to secondary DM ([place])     SURGICAL HISTORY: Past Surgical History:  Procedure Laterality Date   BRONCHIAL NEEDLE ASPIRATION BIOPSY  06/17/2021   Procedure: BRONCHIAL NEEDLE ASPIRATION BIOPSIES;  Surgeon: Candee Furbish, MD;  Location: [facility] ENDOSCOPY;  Service: Pulmonary;;   IR IMAGING GUIDED PORT INSERTION  08/05/2021   MELANOMA EXCISION  2008   Left side of neck   RADIOLOGY WITH ANESTHESIA N/A 04/05/2020   Procedure: MRI SPINE WITOUT CONTRAST;  Surgeon: Radiologist, Medication, MD;  Location: [place];  Service: Radiology;  Laterality: N/A;   RADIOLOGY WITH ANESTHESIA N/A 08/22/2021   Procedure: MRI BRAIN WITH AND WITHOUT CONTRAST  WITH ANESTHESIA;  Surgeon: Radiologist, Medication, MD;  Location: [place];  Service: Radiology;  Laterality: N/A;   RADIOLOGY WITH ANESTHESIA N/A 12/17/2021   Procedure: MRI BRAIN WITH AND WITHOUT CONTRAST WITH ANESTHESIA; MRI LUMBER WITH AND WITHOUT CONTRAST;  Surgeon: Radiologist, Medication, MD;  Location: [place];  Service: Radiology;  Laterality: N/A;   TONSILLECTOMY     VIDEO BRONCHOSCOPY WITH ENDOBRONCHIAL ULTRASOUND N/A 06/17/2021   Procedure: VIDEO BRONCHOSCOPY WITH ENDOBRONCHIAL ULTRASOUND;  Surgeon: Candee Furbish, MD;  Location: [facility] ENDOSCOPY;  Service: Pulmonary;  Laterality: N/A;    SOCIAL HISTORY: Social History   Socioeconomic History   Marital status: Married    Spouse name: Not on file   Number of children: Not on file   Years of education: Not on file   Highest education level: Not on file  Occupational History   Not on file  Tobacco Use   Smoking status: Former    Types: Cigarettes    Quit date: 07/21/1990     Years since quitting: 31.5   Smokeless tobacco: Never  Vaping Use   Vaping Use: Never used  Substance and Sexual Activity   Alcohol use: No   Drug use: No   Sexual activity: Not on file  Other Topics Concern   Not on file  Social History Narrative   Not on file   Social Determinants of Health   Financial Resource Strain: Low Risk  (11/21/2021)   Overall Financial Resource Strain (CARDIA)    Difficulty of Paying Living Expenses: Not hard at all  Food Insecurity: No Food Insecurity (11/19/2021)   Hunger Vital Sign    Worried About Running Out of Food in the Last Year: Never true    [place] in the Last Year: Never true  Transportation Needs: No Transportation Needs (11/21/2021)   PRAPARE - Transportation    Lack of Transportation (Medical): No    Lack  of Transportation (Non-Medical): No  Physical Activity: Inactive (07/31/2021)   Exercise Vital Sign    Days of Exercise per Week: 0 days    Minutes of Exercise per Session: 0 min  Stress: Unknown (07/31/2021)   [place]    Feeling of Stress : Patient refused  Social Connections: Unknown (07/31/2021)   Social Connection and Isolation Panel [NHANES]    Frequency of Communication with Friends and Family: Three times a week    Frequency of Social Gatherings with Friends and Family: Three times a week    Attends Religious Services: Patient refused    Active Member of Clubs or Organizations: Patient refused    Attends Archivist Meetings: Patient refused    Marital Status: Married  Human resources officer Violence: Not At Risk (07/31/2021)   Humiliation, Afraid, Rape, and Kick questionnaire    Fear of Current or Ex-Partner: No    Emotionally Abused: No    Physically Abused: No    Sexually Abused: No    FAMILY HISTORY: Family History  Problem Relation Age of Onset   COPD Mother    Heart disease Father    Heart disease Brother        MI at age 75    Stroke Neg Hx     ALLERGIES:  is allergic to niaspan [niacin].  MEDICATIONS:  Current Outpatient Medications  Medication Sig Dispense Refill   Ca Phosphate-Cholecalciferol (CALCIUM WITH D3 PO) Take 1 capsule by mouth daily.     dexamethasone (DECADRON) 4 MG tablet Take 1 tab every 12 hours the day before pemetrexed chemo, then take 2 tabs once a day for 3 days starting the day after carboplatin. 30 tablet 1   HYDROcodone bit-homatropine (HYCODAN) 5-1.5 MG/5ML syrup Take 5 mLs by mouth every 8 (eight) hours as needed for cough. 120 mL 0   LORazepam (ATIVAN) 1 MG tablet Take 1 tablet (1 mg total) by mouth at bedtime. 30 tablet 2   NOVOLOG FLEXPEN 100 UNIT/ML FlexPen Inject 0-10 Units into the skin daily as needed for high blood sugar (above 200). Sliding scale     omeprazole (PRILOSEC) 20 MG capsule Take 20 mg by mouth daily.     oxyCODONE-acetaminophen (PERCOCET) 7.5-325 MG tablet Take 1 tablet by mouth every 4 (four) hours as needed for severe pain. 60 tablet 0   predniSONE (DELTASONE) 20 MG tablet 3 tabs poqday 1-2, 2 tabs poqday 3-4, 1 tab poqday 5-6 12 tablet 0   tamsulosin (FLOMAX) 0.4 MG CAPS capsule TAKE 1 CAPSULE(0.4 MG) BY MOUTH DAILY 90 capsule 0   UNABLE TO FIND PLEASE CHECK FASTING BLOOD GLUCOSE EVERY MORNING 1 each 0   No current facility-administered medications for this visit.   Facility-Administered Medications Ordered in Other Visits  Medication Dose Route Frequency Provider Last Rate Last Admin   heparin lock flush 100 UNIT/ML injection              PHYSICAL EXAMINATION:  Vitals:   02/13/22 1430  BP: 137/76  Pulse: 98  Resp: 18  Temp: 99.1 F (37.3 C)  SpO2: 100%   Filed Weights   02/13/22 1430  Weight: 147 lb 11.2 oz (67 kg)    Physical Exam Constitutional:      General: He is not in acute distress. HENT:     Head: Normocephalic and atraumatic.  Eyes:     General: No scleral icterus. Cardiovascular:     Rate and Rhythm: Normal rate and  regular  rhythm.     Heart sounds: Normal heart sounds.  Pulmonary:     Effort: Pulmonary effort is normal. No respiratory distress.     Breath sounds: No wheezing.     Comments: Decreased breath sound bilaterally. Abdominal:     General: Bowel sounds are normal. There is no distension.     Palpations: Abdomen is soft.  Musculoskeletal:        General: No deformity. Normal range of motion.     Cervical back: Normal range of motion and neck supple.     Right lower leg: Edema present.     Left lower leg: Edema present.  Skin:    General: Skin is warm and dry.     Findings: No erythema or rash.  Neurological:     Mental Status: He is alert and oriented to person, place, and time. Mental status is at baseline.     Cranial Nerves: No cranial nerve deficit.     Coordination: Coordination normal.  Psychiatric:        Mood and Affect: Mood normal.     LABORATORY DATA:  I have reviewed the data as listed    Latest Ref Rng & Units 02/13/2022    2:11 PM 02/11/2022    2:57 PM 02/03/2022    9:58 AM  CBC  WBC 4.0 - 10.5 K/uL 1.1  0.8  10.5   Hemoglobin 13.0 - 17.0 g/dL 8.2  9.1  9.4   Hematocrit 39.0 - 52.0 % 24.5  27.3  29.7   Platelets 150 - 400 K/uL 62  89  520       Latest Ref Rng & Units 02/11/2022    2:57 PM 02/03/2022    9:58 AM 01/13/2022    9:02 AM  CMP  Glucose 65 - 99 mg/dL 225  250  128   BUN 7 - 25 mg/dL _0 Creatinine 0.70 - 1.28 mg/dL 1.07  1.15  1.00   Sodium 135 - 146 mmol/L 138  132  132   Potassium 3.5 - 5.3 mmol/L 4.8  4.2  3.9   Chloride 98 - 110 mmol/L 102  98  98   CO2 20 - 32 mmol/L _1 Calcium 8.6 - 10.3 mg/dL 8.5  8.8  8.7   Total Protein 6.1 - 8.1 g/dL 6.2  6.5  6.8   Total Bilirubin 0.2 - 1.2 mg/dL 0.5  0.4  0.3   Alkaline Phos 38 - 126 U/L  89  106   AST 10 - 35 U/L 73  33  45   ALT 9 - 46 U/L 57  29  38      Iron/TIBC/Ferritin/ %Sat    Component Value Date/Time   IRON 60 02/03/2022 0958   TIBC 391 02/03/2022 0958   FERRITIN 704  (H) 02/03/2022 0958   IRONPCTSAT 15 (L) 02/03/2022 2130      RADIOGRAPHIC STUDIES: I have personally reviewed the radiological images as listed and agreed with the findings in the report. CT CHEST ABDOMEN PELVIS W CONTRAST  Result Date: 01/28/2022 CLINICAL DATA:  71 year old male with history of non-small cell lung cancer. Restaging examination. * Tracking Code: BO * EXAM: CT CHEST, ABDOMEN, AND PELVIS WITH CONTRAST TECHNIQUE: Multidetector CT imaging of the chest, abdomen and pelvis was performed following the standard protocol during bolus administration of intravenous contrast. RADIATION DOSE REDUCTION: This exam was performed according to the departmental dose-optimization program which  includes automated exposure control, adjustment of the mA and/or kV according to patient size and/or use of iterative reconstruction technique. CONTRAST:  1102m OMNIPAQUE IOHEXOL 300 MG/ML  SOLN COMPARISON:  CT of the abdomen and pelvis 11/18/2021. PET-CT 10/29/2021. Chest CT 06/13/2021. FINDINGS: CT CHEST FINDINGS Cardiovascular: Heart size is normal. There is no significant pericardial fluid, thickening or pericardial calcification. There is aortic atherosclerosis, as well as atherosclerosis of the great vessels of the mediastinum and the coronary arteries, including calcified atherosclerotic plaque in the left main, left anterior descending, left circumflex and right coronary arteries. Right internal jugular single-lumen Port-A-Cath with tip terminating at the superior cavoatrial junction Mediastinum/Nodes: No pathologically enlarged mediastinal or hilar lymph nodes. Prominent but nonenlarged left hilar lymph node measuring 7 mm (axial image 25 of series 2) noted. Esophagus is unremarkable in appearance. No axillary lymphadenopathy. Lungs/Pleura: Treated mass in the left upper lobe currently measures 3.9 x 2.2 cm and has spiculated margins with spiculations extending to the pleura both laterally and posteriorly, as  well as a small amount of surrounding ground-glass attenuation and regional architectural distortion. No other definite new suspicious appearing pulmonary nodules or masses are noted. No acute consolidative airspace disease. No pleural effusions. Musculoskeletal: Sclerotic lesion in the left lateral aspect of the superior endplate of TG28(axial image 54 of series 2) measuring 2.1 x 1.1 cm, likely a healed metastasis. CT ABDOMEN PELVIS FINDINGS Hepatobiliary: Diffuse low attenuation throughout the hepatic parenchyma, indicative of a background of hepatic steatosis. No suspicious cystic or solid hepatic lesions. No intra or extrahepatic biliary ductal dilatation. Gallbladder is unremarkable in appearance. Pancreas: No pancreatic mass. No pancreatic ductal dilatation. No pancreatic or peripancreatic fluid collections or inflammatory changes. Spleen: Unremarkable. Adrenals/Urinary Tract: 1.8 cm low-attenuation lesion in the anterior aspect of the interpolar region of the left kidney (axial image 68 of series 2), compatible with a simple (Bosniak class 1) cyst. Other subcentimeter low-attenuation lesions in both kidneys, too small to definitively characterize, but statistically likely to represent cysts; no imaging follow-up for any of these lesions is recommended at this time. No aggressive appearing renal lesions. Bilateral adrenal glands are normal in appearance. No hydroureteronephrosis. Urinary bladder is normal in appearance. Stomach/Bowel: The appearance of the stomach is normal. There is no pathologic dilatation of small bowel or colon. Normal appendix. Vascular/Lymphatic: Aortic atherosclerosis, without evidence of aneurysm or dissection in the abdominal or pelvic vasculature. No lymphadenopathy noted in the abdomen or pelvis. Reproductive: Prostate gland and seminal vesicles are unremarkable in appearance. Other: No significant volume of ascites.  No pneumoperitoneum. Musculoskeletal: Numerous predominantly  lytic lesions are noted in the visualized skeleton, most evident in the superior endplate of L5 where there is a large lytic lesion measuring 2.4 cm in diameter. Small lytic lesion also noted in the superior endplate of L2 where there appears to be pathologic compression resulting in 25% loss of anterior vertebral body height. Mixed lucency and sclerosis noted in the bony pelvis, most evident in the right iliac wing. IMPRESSION: 1. Treated left upper lobe mass appears grossly stable to the prior examination when measured in a similar fashion on the prior study. No definite signs of extra skeletal metastatic disease noted in the chest, abdomen or pelvis. 2. Widespread skeletal metastases redemonstrated, as above. 3. Hepatic steatosis. 4. Aortic atherosclerosis, in addition to left main and three-vessel coronary artery disease. Assessment for potential risk factor modification, dietary therapy or pharmacologic therapy may be warranted, if clinically indicated. 5. Additional incidental findings, as above. Electronically  Signed   By: Vinnie Langton M.D.   On: 01/28/2022 07:40

## 2022-02-14 ENCOUNTER — Inpatient Hospital Stay: Payer: PPO

## 2022-02-14 ENCOUNTER — Encounter: Payer: Self-pay | Admitting: Family Medicine

## 2022-02-14 ENCOUNTER — Ambulatory Visit (INDEPENDENT_AMBULATORY_CARE_PROVIDER_SITE_OTHER): Payer: PPO | Admitting: Family Medicine

## 2022-02-14 VITALS — BP 132/76 | HR 103 | Ht 67.0 in | Wt 147.8 lb

## 2022-02-14 DIAGNOSIS — Z5111 Encounter for antineoplastic chemotherapy: Secondary | ICD-10-CM | POA: Diagnosis not present

## 2022-02-14 DIAGNOSIS — L98499 Non-pressure chronic ulcer of skin of other sites with unspecified severity: Secondary | ICD-10-CM | POA: Diagnosis not present

## 2022-02-14 DIAGNOSIS — I872 Venous insufficiency (chronic) (peripheral): Secondary | ICD-10-CM | POA: Diagnosis not present

## 2022-02-14 DIAGNOSIS — I5021 Acute systolic (congestive) heart failure: Secondary | ICD-10-CM

## 2022-02-14 DIAGNOSIS — D485 Neoplasm of uncertain behavior of skin: Secondary | ICD-10-CM | POA: Diagnosis not present

## 2022-02-14 DIAGNOSIS — C3412 Malignant neoplasm of upper lobe, left bronchus or lung: Secondary | ICD-10-CM

## 2022-02-14 MED ORDER — FILGRASTIM-SNDZ 300 MCG/0.5ML IJ SOSY
300.0000 ug | PREFILLED_SYRINGE | Freq: Once | INTRAMUSCULAR | Status: AC
  Administered 2022-02-14: 300 ug via SUBCUTANEOUS
  Filled 2022-02-14: qty 0.5

## 2022-02-14 MED ORDER — FUROSEMIDE 20 MG PO TABS: 20.0000 mg | ORAL_TABLET | Freq: Every day | ORAL | 3 refills | Status: DC | PRN

## 2022-02-14 NOTE — Progress Notes (Signed)
Subjective:    Patient ID: Micheal Donovan, male    DOB: 19-Dec-1950, 71 y.o.   MRN: [id number]  HPI Patient was recently seen for petechia and purpura on his legs and was found to have thrombocytopenia and bone marrow suppression related to his chemotherapy agent.  His oncologist started him on treatment for this and is monitoring his thrombocytopenia.  However the patient is here today because he has been developing swelling in his legs and there was concern about possible congestive heart failure.  Of note, the patient had an echocardiogram in April of this year which did show a mildly suppressed ejection fraction of 45 to 50% as well as some diastolic dysfunction.  IMPRESSIONS     1. Abnormal septal motion . Left ventricular ejection fraction, by  estimation, is 45 to 50%. The left ventricle has mildly decreased  function. The left ventricle demonstrates global hypokinesis. There is  mild left ventricular hypertrophy. Left  ventricular diastolic parameters are consistent with Grade I diastolic  dysfunction (impaired relaxation).   2. Right ventricular systolic function is normal. The right ventricular  size is normal.   3. The mitral valve is normal in structure. No evidence of mitral valve  regurgitation. No evidence of mitral stenosis.   4. The aortic valve is tricuspid. There is mild calcification of the  aortic valve. There is mild thickening of the aortic valve. Aortic valve  regurgitation is not visualized. Aortic valve sclerosis is present, with  no evidence of aortic valve stenosis.   5. The inferior vena cava is normal in size with greater than 50%  respiratory variability, suggesting right atrial pressure of 3 mmHg.   Patient denies any chest pain.  He denies any shortness of breath or dyspnea on exertion although he is sedentary.  He does have +1 pitting edema in both ankles up to his mid shin and this is likely leading to the petechia due to venous pressure and capillary  rupture.  Otherwise he is asymptomatic Past Medical History:  Diagnosis Date   Allergy    BPH (benign prostatic hypertrophy)    Cancer ([place])    Melanoma on Neck    2008   Carotid artery occlusion    Diabetes mellitus    type 2   ED (erectile dysfunction)    GERD (gastroesophageal reflux disease)    Hemorrhagic stroke ([place]) 06/2021   Hyperlipidemia    Hypertension    Lung cancer ([place])    Retinopathy due to secondary DM [facility])    Past Surgical History:  Procedure Laterality Date   BRONCHIAL NEEDLE ASPIRATION BIOPSY  06/17/2021   Procedure: BRONCHIAL NEEDLE ASPIRATION BIOPSIES;  Surgeon: Candee Furbish, MD;  Location: [facility] ENDOSCOPY;  Service: Pulmonary;;   IR IMAGING GUIDED PORT INSERTION  08/05/2021   MELANOMA EXCISION  2008   Left side of neck   RADIOLOGY WITH ANESTHESIA N/A 04/05/2020   Procedure: MRI SPINE WITOUT CONTRAST;  Surgeon: Radiologist, Medication, MD;  Location: [place];  Service: Radiology;  Laterality: N/A;   RADIOLOGY WITH ANESTHESIA N/A 08/22/2021   Procedure: MRI BRAIN WITH AND WITHOUT CONTRAST  WITH ANESTHESIA;  Surgeon: Radiologist, Medication, MD;  Location: [place];  Service: Radiology;  Laterality: N/A;   RADIOLOGY WITH ANESTHESIA N/A 12/17/2021   Procedure: MRI BRAIN WITH AND WITHOUT CONTRAST WITH ANESTHESIA; MRI LUMBER WITH AND WITHOUT CONTRAST;  Surgeon: Radiologist, Medication, MD;  Location: [place];  Service: Radiology;  Laterality: N/A;   TONSILLECTOMY     VIDEO  BRONCHOSCOPY WITH ENDOBRONCHIAL ULTRASOUND N/A 06/17/2021   Procedure: VIDEO BRONCHOSCOPY WITH ENDOBRONCHIAL ULTRASOUND;  Surgeon: Candee Furbish, MD;  Location: [facility] ENDOSCOPY;  Service: Pulmonary;  Laterality: N/A;   Current Outpatient Medications on File Prior to Visit  Medication Sig Dispense Refill   Ca Phosphate-Cholecalciferol (CALCIUM WITH D3 PO) Take 1 capsule by mouth daily.     dexamethasone (DECADRON) 4 MG tablet Take 1 tab every 12 hours the day before pemetrexed chemo, then take 2 tabs once a  day for 3 days starting the day after carboplatin. 30 tablet 1   HYDROcodone bit-homatropine (HYCODAN) 5-1.5 MG/5ML syrup Take 5 mLs by mouth every 8 (eight) hours as needed for cough. 120 mL 0   LORazepam (ATIVAN) 1 MG tablet Take 1 tablet (1 mg total) by mouth at bedtime. 30 tablet 2   NOVOLOG FLEXPEN 100 UNIT/ML FlexPen Inject 0-10 Units into the skin daily as needed for high blood sugar (above 200). Sliding scale     omeprazole (PRILOSEC) 20 MG capsule Take 20 mg by mouth daily.     oxyCODONE-acetaminophen (PERCOCET) 7.5-325 MG tablet Take 1 tablet by mouth every 4 (four) hours as needed for severe pain. 60 tablet 0   predniSONE (DELTASONE) 20 MG tablet 3 tabs poqday 1-2, 2 tabs poqday 3-4, 1 tab poqday 5-6 12 tablet 0   tamsulosin (FLOMAX) 0.4 MG CAPS capsule TAKE 1 CAPSULE(0.4 MG) BY MOUTH DAILY 90 capsule 0   UNABLE TO FIND PLEASE CHECK FASTING BLOOD GLUCOSE EVERY MORNING 1 each 0   [DISCONTINUED] prochlorperazine (COMPAZINE) 10 MG tablet Take 1 tablet (10 mg total) by mouth every 6 (six) hours as needed (Nausea or vomiting). 30 tablet 1   Current Facility-Administered Medications on File Prior to Visit  Medication Dose Route Frequency Provider Last Rate Last Admin   heparin lock flush 100 UNIT/ML injection            Allergies  Allergen Reactions   Niaspan [Niacin]     FLUSHING   Social History   Socioeconomic History   Marital status: Married    Spouse name: Not on file   Number of children: Not on file   Years of education: Not on file   Highest education level: Not on file  Occupational History   Not on file  Tobacco Use   Smoking status: Former    Types: Cigarettes    Quit date: 07/21/1990    Years since quitting: 31.5   Smokeless tobacco: Never  Vaping Use   Vaping Use: Never used  Substance and Sexual Activity   Alcohol use: No   Drug use: No   Sexual activity: Not on file  Other Topics Concern   Not on file  Social History Narrative   Not on file   Social  Determinants of Health   Financial Resource Strain: Low Risk  (11/21/2021)   Overall Financial Resource Strain (CARDIA)    Difficulty of Paying Living Expenses: Not hard at all  Food Insecurity: No Food Insecurity (11/19/2021)   Hunger Vital Sign    Worried About Running Out of Food in the Last Year: Never true    [place] in the Last Year: Never true  Transportation Needs: No Transportation Needs (11/21/2021)   PRAPARE - Hydrologist (Medical): No    Lack of Transportation (Non-Medical): No  Physical Activity: Inactive (07/31/2021)   Exercise Vital Sign    Days of Exercise per Week: 0 days  Minutes of Exercise per Session: 0 min  Stress: Unknown (07/31/2021)   [place]    Feeling of Stress : Patient refused  Social Connections: Unknown (07/31/2021)   Social Connection and Isolation Panel [NHANES]    Frequency of Communication with Friends and Family: Three times a week    Frequency of Social Gatherings with Friends and Family: Three times a week    Attends Religious Services: Patient refused    Active Member of Clubs or Organizations: Patient refused    Attends Archivist Meetings: Patient refused    Marital Status: Married  Human resources officer Violence: Not At Risk (07/31/2021)   Humiliation, Afraid, Rape, and Kick questionnaire    Fear of Current or Ex-Partner: No    Emotionally Abused: No    Physically Abused: No    Sexually Abused: No      Review of Systems  All other systems reviewed and are negative.      Objective:   Physical Exam Vitals reviewed.  Cardiovascular:     Rate and Rhythm: Normal rate and regular rhythm.     Heart sounds: Normal heart sounds.  Pulmonary:     Effort: Pulmonary effort is normal.     Breath sounds: Normal breath sounds. No wheezing or rales.  Musculoskeletal:     Right lower leg: Edema present.     Left lower leg: Edema  present.  Skin:    Findings: Petechiae and rash present.                Assessment & Plan:  Acute systolic congestive heart failure ([place]) - Plan: Ambulatory referral to Cardiology We discussed the echocardiogram from April.  At that time the patient had suffered a hemorrhagic stroke, had been diagnosed with lung cancer, and we were not certain what his long-term prognosis would be.  Therefore aggressive treatment was deferred.  Now the patient is doing better.  He is developing peripheral edema and this is likely related to his reduced ejection fraction.  I gave the patient Lasix today 20 mg daily as needed for swelling.  I recommended that he take his weight every day and if he gains more than 2 pounds in 24 hours he use Lasix.  We also discussed treatment for congestive heart failure including beta-blockers, ARB, Entresto, spironolactone, and SGLT2 inhibitors.  I suggested that we start the patient on Entresto.  However the patient would like to discuss the situation with his oncologist and see a cardiologist for a second opinion.  Therefore I will arrange cardiology consultation.

## 2022-02-15 ENCOUNTER — Other Ambulatory Visit: Payer: Self-pay | Admitting: Oncology

## 2022-02-17 ENCOUNTER — Inpatient Hospital Stay: Payer: PPO

## 2022-02-17 ENCOUNTER — Telehealth: Payer: Self-pay | Admitting: *Deleted

## 2022-02-17 ENCOUNTER — Encounter: Payer: Self-pay | Admitting: *Deleted

## 2022-02-17 ENCOUNTER — Other Ambulatory Visit: Payer: Self-pay

## 2022-02-17 ENCOUNTER — Inpatient Hospital Stay (HOSPITAL_BASED_OUTPATIENT_CLINIC_OR_DEPARTMENT_OTHER): Payer: PPO | Admitting: Medical Oncology

## 2022-02-17 VITALS — BP 149/85 | HR 91 | Temp 98.1°F | Resp 18 | Ht 67.0 in | Wt 146.0 lb

## 2022-02-17 DIAGNOSIS — L03119 Cellulitis of unspecified part of limb: Secondary | ICD-10-CM | POA: Diagnosis not present

## 2022-02-17 DIAGNOSIS — C3412 Malignant neoplasm of upper lobe, left bronchus or lung: Secondary | ICD-10-CM | POA: Diagnosis not present

## 2022-02-17 DIAGNOSIS — M7989 Other specified soft tissue disorders: Secondary | ICD-10-CM | POA: Diagnosis not present

## 2022-02-17 DIAGNOSIS — Z5111 Encounter for antineoplastic chemotherapy: Secondary | ICD-10-CM | POA: Diagnosis not present

## 2022-02-17 LAB — CBC WITH DIFFERENTIAL/PLATELET
Abs Immature Granulocytes: 0.22 10*3/uL — ABNORMAL HIGH (ref 0.00–0.07)
Basophils Absolute: 0 10*3/uL (ref 0.0–0.1)
Basophils Relative: 0 %
Eosinophils Absolute: 0 10*3/uL (ref 0.0–0.5)
Eosinophils Relative: 0 %
HCT: 25.4 % — ABNORMAL LOW (ref 39.0–52.0)
Hemoglobin: 8.5 g/dL — ABNORMAL LOW (ref 13.0–17.0)
Immature Granulocytes: 3 %
Lymphocytes Relative: 6 %
Lymphs Abs: 0.5 10*3/uL — ABNORMAL LOW (ref 0.7–4.0)
MCH: 33.9 pg (ref 26.0–34.0)
MCHC: 33.5 g/dL (ref 30.0–36.0)
MCV: 101.2 fL — ABNORMAL HIGH (ref 80.0–100.0)
Monocytes Absolute: 1 10*3/uL (ref 0.1–1.0)
Monocytes Relative: 12 %
Neutro Abs: 6.6 10*3/uL (ref 1.7–7.7)
Neutrophils Relative %: 79 %
Platelets: 109 10*3/uL — ABNORMAL LOW (ref 150–400)
RBC: 2.51 MIL/uL — ABNORMAL LOW (ref 4.22–5.81)
RDW: 17.3 % — ABNORMAL HIGH (ref 11.5–15.5)
WBC: 8.4 10*3/uL (ref 4.0–10.5)
nRBC: 0.4 % — ABNORMAL HIGH (ref 0.0–0.2)

## 2022-02-17 MED ORDER — DOXYCYCLINE HYCLATE 100 MG PO TABS: 100.0000 mg | ORAL_TABLET | Freq: Two times a day (BID) | ORAL | 0 refills | Status: DC

## 2022-02-17 MED ORDER — DOXYCYCLINE HYCLATE 100 MG PO TABS: 100.0000 mg | ORAL_TABLET | Freq: Two times a day (BID) | ORAL | 0 refills | Status: AC

## 2022-02-17 NOTE — Progress Notes (Signed)
Symptom Management [place] at [facility] [phone] [phone]  Patient Care Team: [name], MD as PCP - General (Family Medicine) [name], RN as Oncology Nurse Navigator   Name of the patient: Micheal Donovan  [id number]  Oct 14, 1950   Date of visit: 02/17/22  Reason for Consult: Micheal Donovan is a 71 y.o. male with stage IV non-small cell carcinoma of the upper lobe of left lung currently on Keytruda  who presents today for:  Leg swelling: He has a history of acute systolic congestive heart failure who was last seen by his cardiologist on 02/14/2022 at which time he was started on lasix 20 mg for some peripheral edema. He reports that the legs have been red and painful for the past 2-3 days. Has seen some small blisters on his legs that have not erupted. He has taken his lasix which may have helped the swelling a bit but not significantly. No fevers, cold symptoms or weeping from the legs. He does not have a rash anywhere else on his body.   Denies any neurologic complaints. Denies recent fevers or illnesses. Denies any easy bleeding or bruising. Reports good appetite and denies weight loss. Denies chest pain. Denies any nausea, vomiting, constipation, or diarrhea. Denies urinary complaints. Patient offers no further specific complaints today.  Wt Readings from Last 3 Encounters:  02/17/22 146 lb (66.2 kg)  02/14/22 147 lb 12.8 oz (67 kg)  02/13/22 147 lb 11.2 oz (67 kg)     PAST MEDICAL HISTORY: Past Medical History:  Diagnosis Date   Allergy    BPH (benign prostatic hypertrophy)    Cancer (HCC)    Melanoma on Neck    2008   Carotid artery occlusion    Diabetes mellitus    type 2   ED (erectile dysfunction)    GERD (gastroesophageal reflux disease)    Hemorrhagic stroke ([place]) 06/2021   Hyperlipidemia    Hypertension    Lung cancer ([place])    Retinopathy due to secondary DM ([place])     PAST SURGICAL  HISTORY:  Past Surgical History:  Procedure Laterality Date   BRONCHIAL NEEDLE ASPIRATION BIOPSY  06/17/2021   Procedure: BRONCHIAL NEEDLE ASPIRATION BIOPSIES;  Surgeon: Candee Furbish, MD;  Location: [facility] ENDOSCOPY;  Service: Pulmonary;;   IR IMAGING GUIDED PORT INSERTION  08/05/2021   MELANOMA EXCISION  2008   Left side of neck   RADIOLOGY WITH ANESTHESIA N/A 04/05/2020   Procedure: MRI SPINE WITOUT CONTRAST;  Surgeon: Radiologist, Medication, MD;  Location: [place];  Service: Radiology;  Laterality: N/A;   RADIOLOGY WITH ANESTHESIA N/A 08/22/2021   Procedure: MRI BRAIN WITH AND WITHOUT CONTRAST  WITH ANESTHESIA;  Surgeon: Radiologist, Medication, MD;  Location: [place];  Service: Radiology;  Laterality: N/A;   RADIOLOGY WITH ANESTHESIA N/A 12/17/2021   Procedure: MRI BRAIN WITH AND WITHOUT CONTRAST WITH ANESTHESIA; MRI LUMBER WITH AND WITHOUT CONTRAST;  Surgeon: Radiologist, Medication, MD;  Location: [place];  Service: Radiology;  Laterality: N/A;   TONSILLECTOMY     VIDEO BRONCHOSCOPY WITH ENDOBRONCHIAL ULTRASOUND N/A 06/17/2021   Procedure: VIDEO BRONCHOSCOPY WITH ENDOBRONCHIAL ULTRASOUND;  Surgeon: Candee Furbish, MD;  Location: [facility] ENDOSCOPY;  Service: Pulmonary;  Laterality: N/A;    HEMATOLOGY/ONCOLOGY HISTORY:  Oncology History Overview Note  Diagnosis: Stage IIB T2b N1 M0 adenocarcinoma of the LUL, poorly differentiated     Primary non-small cell carcinoma of upper lobe of left lung ([place])  06/13/2021 Initial Diagnosis  Primary non-small cell carcinoma of upper lobe of left lung [facility]) -06/20/2021 - 06/27/2021, patient presented to [facility] due to progressive headache/dizziness/gait changes. CT head showed acute to subacute intraparenchymal hemorrhage involving the left cerebellum.  Surrounding low-density vasogenic edema.  Patient was transferred to [facility]. This was further evaluated by CT angiogram of the neck which showed bulky calcified plaques/stenosis of carotid artery,  Followed by MRI brain. 06/12/2021, MRI of the brain showed no significant interval change in size of the left cerebellar intraparenchymal hematoma with unchanged regional mass effect and partial effacement of fourth ventricle but no upstream hydrocephalus. There is no discernible enhancement to suggest underlying mass lesion, though acute blood products could mask enhancement.   06/12/2021 a chest x-ray showed a 4.4 cm left middle lobe. 06/13/2021, CT chest with contrast showed a 4.3 x 3.6 x 3.3 cm lobular spiculated mass in the posterior left upper lobe with T3 to the lateral pleura and major fissure.  Metastatic left hilar lymphadenopathy.  Peripheral micronodularity posterior right costophrenic sulcus.  Aortic atherosclerosis. 06/14/2021, CT abdomen pelvis showed right lower lobe pulmonary artery embolus.  No evidence of right heart strain.  No acute intra-abdominal or pelvic pathology.  Aortic atherosclerosis.  06/13/2021, patient underwent bronchoscopy with EBUS by Dr. Tamala Julian.  Biopsy from the fine-needle aspiration of station 11 mL showed malignant cells, consistent with poorly differentiated non-small cell carcinoma, consistent with adenocarcinoma.  Malignant cells are TTF-1 positive and negative for p40.  Negative for neuroendocrine markers.  Blood test and tissue sample were tested for Gardant 360  -PD-L1 TPS 97%, no actionable mutation on the blood testing. 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   07/17/2021 Cancer Staging   Staging form: Lung, AJCC 8th Edition - Clinical: Stage IV (cT2b, cN1, cM1) - Signed by [name], MD on 08/06/2021   08/06/2021 Imaging   I saw patient's PET scan after his visit with me on 08/06/21. PET scan was ordered by his previous oncologist group and did not come to my in basket.. Patient received cycle 1 carboplatin Taxol today.  He has started on radiation.  5/26/3 PET scan showed 3.8 cm hypermetabolic left upper lobe mass with hypermetabolic left hilar  and infrahilar adenopathy.  There is approximately 8 scattered metastatic lesions in the skeleton. Mixed density photopenic lesion in the left cerebellum. characterized as hemorrhage on recent prior imaging workups.    08/06/2021 - 08/06/2021 Chemotherapy   Patient is on Treatment Plan : LUNG Carboplatin / Paclitaxel + XRT q7d     08/16/2021 -  Chemotherapy   Switched to systemic chemotherapy with  Carboplatin (4.5) + Pemetrexed (400) + Pembrolizumab (200) D1 q21d Induction x 4 cycles      08/16/2021 -  Chemotherapy   Patient is on Treatment Plan : LUNG Carboplatin (5) + Pemetrexed + Pembrolizumab (200) D1 q21d Induction x 4 cycles / Maintenance Pemetrexed + Pembrolizumab (200) D1 q21d     08/22/2021 Imaging   MRI brain w wo contrast  Decrease in size of left cerebellar hematoma with resolution of edema. No evidence of underlying lesion. Smaller, more recent hemorrhage in the left cerebellar vermis with minimal edema. There is minimal enhancement without definite evidence of underlying lesion.  New punctate focus of chronic blood products and enhancement in the left frontal lobe. Additional new foci of chronic blood products in the posterior right putamen, right parietal subcortical white matter, and posterior right cerebellum. Unclear at this time if these represent foci of bland hemorrhage or  early metastases.   Increase in size of right parietal osseous metastasis with minor extraosseous extension.    10/22/2021 - 10/22/2021 Chemotherapy   Patient is on Treatment Plan : LUNG Carboplatin (5) + Pemetrexed (500) + Pembrolizumab (200) D1 q21d Induction x 4 cycles / Maintenance Pemetrexed (500) + Pembrolizumab (200) D1 q21d      Imaging   PET scan showed 1. Interval response to therapy as evidenced by a small residual left upper lobe nodule with decreased hypermetabolism, no residual hypermetabolic adenopathy and decreased hypermetabolism associatedwith osseous metastases. 2. 6 mm posterior left  upper lobe nodule, likely stable. Recommend attention on follow-up. 3. Aortic atherosclerosis (ICD10-I70.0). Coronary artery calcification.   12/17/2021 Imaging   MRI lumbar spine w wo contrast  1. No evidence of regional metastatic disease. 2. Late subacute superior endplate fracture at L2 and large superior endplate Schmorl's node at L5, unchanged since the PET scan of 10/29/2021. 3. L3-4: Shallow disc protrusion. Facet and ligamentous hypertrophy. Stenosis of both lateral recesses. Findings slightly worsened since 2022. 4. L4-5: Shallow disc protrusion. Facet and ligamentous hypertrophy. Small synovial cyst arising from the facet joint on the right. Stenosis of the lateral recesses right worse than left. Foraminal narrowing right worse than left. Findings have worsened since 2022.  5. L5-S1: Endplate osteophytes and shallow protrusion of the disc. Facet and ligamentous hypertrophy. Stenosis of the subarticular lateral recesses and neural foramina, right worse than left. Similar appearance to the study of 2022. 6. Continued evidence of enteritis of at least 1 loop of small bowel. Distended bladder present   12/18/2021 Imaging   Brian MRI w wo  1. Interval decrease in size of the left cerebellar and vermian hematomas. No definite evidence of underlying metastatic lesion. 2. There is are two possible contrast enhancing lesions in the posterior left frontal lobe and left occipital lobe, which do not have intrinsic T1 signal abnormality, but have a somewhat linear appearance and may be vascular in nature. Recommend attention on follow up. 3. Multifocal sites of susceptibility artifact are redemonstrated with interval development of a few new foci, as described above. All of these sites demonstrate intrinsic T1 hyperintense signal abnormality and susceptibility artifact and are compatible with sites of microhemorrhages. Recommend continued attention on follow up. 4. Interval decrease in size of right  parietal calvarial metastatic lesion. No new contrast enhancing lesions visualized. 5. Diffusely heterogeneous marrow signal throughout the cervical spine, which is nonspecific but can be seen in the setting of anemia, smoking, obesity, or a marrow replacement process.     12/18/2021 Imaging   MRI lumbar spine w wo contrast  1. No evidence of regional metastatic disease. 2. Late subacute superior endplate fracture at L2 and large superior endplate Schmorl's node at L5, unchanged since the PET scan of 10/29/2021 3. L3-4: Shallow disc protrusion. Facet and ligamentous hypertrophy.Stenosis of both lateral recesses. Findings slightly worsened since 2022. 4. L4-5: Shallow disc protrusion. Facet and ligamentous hypertrophy. Small synovial cyst arising from the facet joint on the right. Stenosis of the lateral recesses right worse than left. Foraminal narrowing right worse than left. Findings have worsened since 2022. 5. L5-S1: Endplate osteophytes and shallow protrusion of the disc. Facet and ligamentous hypertrophy. Stenosis of the subarticular lateral recesses and neural foramina, right worse than left. Similar appearance to the study of 2022. 6. Continued evidence of enteritis of at least 1 loop of small bowel. Distended bladder present.       01/28/2022 Imaging   CT chest abdomen  pelvis w contrast 1. Treated left upper lobe mass appears grossly stable to the prior examination when measured in a similar fashion on the prior study. No definite signs of extra skeletal metastatic disease noted in the chest, abdomen or pelvis. 2. Widespread skeletal metastases redemonstrated, as above. 3. Hepatic steatosis. 4. Aortic atherosclerosis, in addition to left main and three-vessel coronary artery disease. Assessment for potential risk factor modification, dietary therapy or pharmacologic therapy may be warranted, if clinically indicated. 5. Additional incidental findings,      ALLERGIES:  is  allergic to niaspan [niacin].  MEDICATIONS:  Current Outpatient Medications  Medication Sig Dispense Refill   Ca Phosphate-Cholecalciferol (CALCIUM WITH D3 PO) Take 1 capsule by mouth daily.     LORazepam (ATIVAN) 1 MG tablet Take 1 tablet (1 mg total) by mouth at bedtime. 30 tablet 2   NOVOLOG FLEXPEN 100 UNIT/ML FlexPen Inject 0-10 Units into the skin daily as needed for high blood sugar (above 200). Sliding scale     omeprazole (PRILOSEC) 20 MG capsule Take 20 mg by mouth daily.     tamsulosin (FLOMAX) 0.4 MG CAPS capsule TAKE 1 CAPSULE(0.4 MG) BY MOUTH DAILY 90 capsule 0   UNABLE TO FIND PLEASE CHECK FASTING BLOOD GLUCOSE EVERY MORNING 1 each 0   dexamethasone (DECADRON) 4 MG tablet Take 1 tab every 12 hours the day before pemetrexed chemo, then take 2 tabs once a day for 3 days starting the day after carboplatin. (Patient not taking: Reported on 02/17/2022) 30 tablet 1   doxycycline (VIBRA-TABS) 100 MG tablet Take 1 tablet (100 mg total) by mouth 2 (two) times daily for 10 days. 20 tablet 0   furosemide (LASIX) 20 MG tablet Take 1 tablet (20 mg total) by mouth daily as needed (leg swelling or sob). (Patient not taking: Reported on 02/17/2022) 30 tablet 3   HYDROcodone bit-homatropine (HYCODAN) 5-1.5 MG/5ML syrup Take 5 mLs by mouth every 8 (eight) hours as needed for cough. (Patient not taking: Reported on 02/17/2022) 120 mL 0   No current facility-administered medications for this visit.   Facility-Administered Medications Ordered in Other Visits  Medication Dose Route Frequency Provider Last Rate Last Admin   heparin lock flush 100 UNIT/ML injection             VITAL SIGNS: BP (!) 149/85   Pulse 91   Temp 98.1 F (36.7 C) (Tympanic)   Resp 18   Ht _0  (1.702 m)   Wt 146 lb (66.2 kg)   BMI 22.87 kg/m  Filed Weights   02/17/22 1515  Weight: 146 lb (66.2 kg)    Estimated body mass index is 22.87 kg/m as calculated from the following:   Height as of this encounter: _1   (1.702 m).   Weight as of this encounter: 146 lb (66.2 kg).  LABS: CBC:    Component Value Date/Time   WBC 8.4 02/17/2022 1449   HGB 8.5 (L) 02/17/2022 1449   HGB 13.4 07/17/2021 1345   HCT 25.4 (L) 02/17/2022 1449   PLT 109 (L) 02/17/2022 1449   PLT 373 07/17/2021 1345   MCV 101.2 (H) 02/17/2022 1449   NEUTROABS 6.6 02/17/2022 1449   LYMPHSABS 0.5 (L) 02/17/2022 1449   MONOABS 1.0 02/17/2022 1449   EOSABS 0.0 02/17/2022 1449   BASOSABS 0.0 02/17/2022 1449   Comprehensive Metabolic Panel:    Component Value Date/Time   NA 138 02/11/2022 1457   K 4.8 02/11/2022 1457   CL 102 02/11/2022 1457  CO2 24 02/11/2022 1457   BUN 15 02/11/2022 1457   CREATININE 1.07 02/11/2022 1457   GLUCOSE 225 (H) 02/11/2022 1457   CALCIUM 8.5 (L) 02/11/2022 1457   AST 73 (H) 02/11/2022 1457   AST 18 07/17/2021 1345   ALT 57 (H) 02/11/2022 1457   ALT 20 07/17/2021 1345   ALKPHOS 89 02/03/2022 0958   BILITOT 0.5 02/11/2022 1457   BILITOT 0.6 07/17/2021 1345   PROT 6.2 02/11/2022 1457   ALBUMIN 3.2 (L) 02/03/2022 0958    RADIOGRAPHIC STUDIES: DG Chest 2 View  Result Date: 02/15/2022 CLINICAL DATA:  Cough and congestion.  History of lung cancer. EXAM: CHEST - 2 VIEW COMPARISON:  11/18/2021 FINDINGS: Right jugular Port-A-Cath is stable with the tip near the superior cavoatrial junction. Stable patchy densities in the mid and lower left lung. No new airspace disease or consolidation. Heart size is normal. Atherosclerotic calcifications at the aortic arch. No pleural effusions. Stable compression deformity along the superior endplate of L2. IMPRESSION: 1. No acute cardiopulmonary disease. 2. Stable patchy densities in the mid and lower left lung. Electronically Signed   By: Markus Daft M.D.   On: 02/15/2022 09:17   CT CHEST ABDOMEN PELVIS W CONTRAST  Result Date: 01/28/2022 CLINICAL DATA:  71 year old male with history of non-small cell lung cancer. Restaging examination. * Tracking Code: BO *  EXAM: CT CHEST, ABDOMEN, AND PELVIS WITH CONTRAST TECHNIQUE: Multidetector CT imaging of the chest, abdomen and pelvis was performed following the standard protocol during bolus administration of intravenous contrast. RADIATION DOSE REDUCTION: This exam was performed according to the departmental dose-optimization program which includes automated exposure control, adjustment of the mA and/or kV according to patient size and/or use of iterative reconstruction technique. CONTRAST:  168m OMNIPAQUE IOHEXOL 300 MG/ML  SOLN COMPARISON:  CT of the abdomen and pelvis 11/18/2021. PET-CT 10/29/2021. Chest CT 06/13/2021. FINDINGS: CT CHEST FINDINGS Cardiovascular: Heart size is normal. There is no significant pericardial fluid, thickening or pericardial calcification. There is aortic atherosclerosis, as well as atherosclerosis of the great vessels of the mediastinum and the coronary arteries, including calcified atherosclerotic plaque in the left main, left anterior descending, left circumflex and right coronary arteries. Right internal jugular single-lumen Port-A-Cath with tip terminating at the superior cavoatrial junction Mediastinum/Nodes: No pathologically enlarged mediastinal or hilar lymph nodes. Prominent but nonenlarged left hilar lymph node measuring 7 mm (axial image 25 of series 2) noted. Esophagus is unremarkable in appearance. No axillary lymphadenopathy. Lungs/Pleura: Treated mass in the left upper lobe currently measures 3.9 x 2.2 cm and has spiculated margins with spiculations extending to the pleura both laterally and posteriorly, as well as a small amount of surrounding ground-glass attenuation and regional architectural distortion. No other definite new suspicious appearing pulmonary nodules or masses are noted. No acute consolidative airspace disease. No pleural effusions. Musculoskeletal: Sclerotic lesion in the left lateral aspect of the superior endplate of TG64(axial image 54 of series 2) measuring  2.1 x 1.1 cm, likely a healed metastasis. CT ABDOMEN PELVIS FINDINGS Hepatobiliary: Diffuse low attenuation throughout the hepatic parenchyma, indicative of a background of hepatic steatosis. No suspicious cystic or solid hepatic lesions. No intra or extrahepatic biliary ductal dilatation. Gallbladder is unremarkable in appearance. Pancreas: No pancreatic mass. No pancreatic ductal dilatation. No pancreatic or peripancreatic fluid collections or inflammatory changes. Spleen: Unremarkable. Adrenals/Urinary Tract: 1.8 cm low-attenuation lesion in the anterior aspect of the interpolar region of the left kidney (axial image 68 of series 2), compatible with a simple (  Bosniak class 1) cyst. Other subcentimeter low-attenuation lesions in both kidneys, too small to definitively characterize, but statistically likely to represent cysts; no imaging follow-up for any of these lesions is recommended at this time. No aggressive appearing renal lesions. Bilateral adrenal glands are normal in appearance. No hydroureteronephrosis. Urinary bladder is normal in appearance. Stomach/Bowel: The appearance of the stomach is normal. There is no pathologic dilatation of small bowel or colon. Normal appendix. Vascular/Lymphatic: Aortic atherosclerosis, without evidence of aneurysm or dissection in the abdominal or pelvic vasculature. No lymphadenopathy noted in the abdomen or pelvis. Reproductive: Prostate gland and seminal vesicles are unremarkable in appearance. Other: No significant volume of ascites.  No pneumoperitoneum. Musculoskeletal: Numerous predominantly lytic lesions are noted in the visualized skeleton, most evident in the superior endplate of L5 where there is a large lytic lesion measuring 2.4 cm in diameter. Small lytic lesion also noted in the superior endplate of L2 where there appears to be pathologic compression resulting in 25% loss of anterior vertebral body height. Mixed lucency and sclerosis noted in the bony pelvis,  most evident in the right iliac wing. IMPRESSION: 1. Treated left upper lobe mass appears grossly stable to the prior examination when measured in a similar fashion on the prior study. No definite signs of extra skeletal metastatic disease noted in the chest, abdomen or pelvis. 2. Widespread skeletal metastases redemonstrated, as above. 3. Hepatic steatosis. 4. Aortic atherosclerosis, in addition to left main and three-vessel coronary artery disease. Assessment for potential risk factor modification, dietary therapy or pharmacologic therapy may be warranted, if clinically indicated. 5. Additional incidental findings, as above. Electronically Signed   By: Vinnie Langton M.D.   On: 01/28/2022 07:40    PERFORMANCE STATUS (ECOG) : 1 - Symptomatic but completely ambulatory  Review of Systems Unless otherwise noted, a complete review of systems is negative.  Physical Exam General: NAD Cardiovascular: regular rate and rhythm Pulmonary: clear ant fields Abdomen: soft, nontender, + bowel sounds GU: no suprapubic tenderness Extremities: no edema, no joint deformities Skin: See below Neurological: Weakness but otherwise nonfocal       Assessment and Plan- Patient is a 71 y.o. male    Encounter Diagnoses  Name Primary?   Cellulitis of lower extremity, unspecified laterality Yes   Primary non-small cell carcinoma of upper lobe of left lung (HCC)    Leg swelling    Appears to be cellulitis secondary to recent leg swelling from his CFH. No other rash of body. Will watch closely to ensure he heals as expected. If not improved, worsening may be secondary to his [place] at which time he would need a steroid. For now doxycyline x 10 days. Discussed with patient and his daughter. Discussed red flag signs and symptoms. Mychart message or office call in 2-3 days to let us know how he is doing. Follow up PRN.     Patient expressed understanding and was in agreement with this plan. He also understands  that He can call clinic at any time with any questions, concerns, or complaints.   Thank you for allowing me to participate in the care of this very pleasant patient.   Time Total: 25  Visit consisted of counseling and education dealing with the complex and emotionally intense issues of symptom management in the setting of serious illness.Greater than 50%  of this time was spent counseling and coordinating care related to the above assessment and plan.  Signed by: Nelwyn Salisbury, PA-C

## 2022-02-17 NOTE — Telephone Encounter (Signed)
Pt has an appt this afternoon for labs/zarxio injection and is wondering if a provider can look at his legs. Pt's wife is stating that his legs have been hurting him lately and is causing him difficulty getting out of bed.   Please advise.

## 2022-02-17 NOTE — Progress Notes (Signed)
6.6 - anc. No zarxio needed today.

## 2022-02-19 ENCOUNTER — Telehealth: Payer: Self-pay | Admitting: *Deleted

## 2022-02-19 ENCOUNTER — Encounter: Payer: Self-pay | Admitting: *Deleted

## 2022-02-19 ENCOUNTER — Inpatient Hospital Stay (HOSPITAL_BASED_OUTPATIENT_CLINIC_OR_DEPARTMENT_OTHER): Payer: PPO | Admitting: Medical Oncology

## 2022-02-19 ENCOUNTER — Encounter: Payer: Self-pay | Admitting: Medical Oncology

## 2022-02-19 VITALS — BP 128/83 | HR 89 | Temp 97.9°F

## 2022-02-19 DIAGNOSIS — M7989 Other specified soft tissue disorders: Secondary | ICD-10-CM

## 2022-02-19 DIAGNOSIS — Z5111 Encounter for antineoplastic chemotherapy: Secondary | ICD-10-CM | POA: Diagnosis not present

## 2022-02-19 MED ORDER — TRIAMCINOLONE ACETONIDE 0.5 % EX OINT: 1.0000 | TOPICAL_OINTMENT | Freq: Two times a day (BID) | CUTANEOUS | 0 refills | Status: DC

## 2022-02-19 MED ORDER — OXYCODONE-ACETAMINOPHEN 7.5-325 MG PO TABS: 1.0000 | ORAL_TABLET | ORAL | 0 refills | Status: DC | PRN

## 2022-02-19 MED ORDER — PREDNISONE 20 MG PO TABS: ORAL_TABLET | ORAL | 0 refills | Status: AC

## 2022-02-19 NOTE — Telephone Encounter (Signed)
I called the pt. Pt states that his feet feel like they are getting ready to 'burst open.' Legs are very swollen and red. Pt willing to have an evaluation in the clinic today; however he is uncertain if his daughter can take him. His wife is unable to drive due to medical problems. He doesn't drive and his daughter is at work. Rn scheduled pt for 130 pm today. Pt to call back if he can not make this smc apt. Pt declined mychart visit with app.  Pt saw Judson Roch, [place] on 12/18. Dx w/cellulitis. I confirmed w/pt that he did pickup and start on the doxycyline that was sent to the pharmacy. Pt pt's words, "the legs look and feel worse."

## 2022-02-19 NOTE — Progress Notes (Signed)
Symptom Management [place] at [facility] [phone] [phone]  Patient Care Team: [name], MD as PCP - General (Family Medicine) [name], RN as Oncology Nurse Navigator   Name of the patient: Micheal Donovan  [id number]  1951/01/12   Date of visit: 02/21/22  Reason for Consult: ZAHEER Donovan is a 71 y.o. male with stage IV non-small cell carcinoma of the upper lobe of left lung currently on Keytruda  who presents today for:  Leg swelling: He has a history of acute systolic congestive heart failure who was last seen by his cardiologist on 02/14/2022 at which time he was started on lasix 20 mg for some peripheral edema. He reports that the legs have been red and painful for the past 2-3 days. Has seen some small blisters on his legs that have not erupted. He has taken his lasix which may have helped the swelling a bit but not significantly. No fevers, cold symptoms or weeping from the legs. He does not have a rash anywhere else on his body.   Denies any neurologic complaints. Denies recent fevers or illnesses. Denies any easy bleeding or bruising. Reports good appetite and denies weight loss. Denies chest pain. Denies any nausea, vomiting, constipation, or diarrhea. Denies urinary complaints. Patient offers no further specific complaints today.  Wt Readings from Last 3 Encounters:  02/21/22 145 lb 3.2 oz (65.9 kg)  02/17/22 146 lb (66.2 kg)  02/14/22 147 lb 12.8 oz (67 kg)     PAST MEDICAL HISTORY: Past Medical History:  Diagnosis Date   Allergy    BPH (benign prostatic hypertrophy)    Cancer (HCC)    Melanoma on Neck    2008   Carotid artery occlusion    Diabetes mellitus    type 2   ED (erectile dysfunction)    GERD (gastroesophageal reflux disease)    Hemorrhagic stroke ([place]) 06/2021   Hyperlipidemia    Hypertension    Lung cancer ([place])    Retinopathy due to secondary DM ([place])     PAST SURGICAL  HISTORY:  Past Surgical History:  Procedure Laterality Date   BRONCHIAL NEEDLE ASPIRATION BIOPSY  06/17/2021   Procedure: BRONCHIAL NEEDLE ASPIRATION BIOPSIES;  Surgeon: Candee Furbish, MD;  Location: [facility] ENDOSCOPY;  Service: Pulmonary;;   IR IMAGING GUIDED PORT INSERTION  08/05/2021   MELANOMA EXCISION  2008   Left side of neck   RADIOLOGY WITH ANESTHESIA N/A 04/05/2020   Procedure: MRI SPINE WITOUT CONTRAST;  Surgeon: Radiologist, Medication, MD;  Location: [place];  Service: Radiology;  Laterality: N/A;   RADIOLOGY WITH ANESTHESIA N/A 08/22/2021   Procedure: MRI BRAIN WITH AND WITHOUT CONTRAST  WITH ANESTHESIA;  Surgeon: Radiologist, Medication, MD;  Location: [place];  Service: Radiology;  Laterality: N/A;   RADIOLOGY WITH ANESTHESIA N/A 12/17/2021   Procedure: MRI BRAIN WITH AND WITHOUT CONTRAST WITH ANESTHESIA; MRI LUMBER WITH AND WITHOUT CONTRAST;  Surgeon: Radiologist, Medication, MD;  Location: [place];  Service: Radiology;  Laterality: N/A;   TONSILLECTOMY     VIDEO BRONCHOSCOPY WITH ENDOBRONCHIAL ULTRASOUND N/A 06/17/2021   Procedure: VIDEO BRONCHOSCOPY WITH ENDOBRONCHIAL ULTRASOUND;  Surgeon: Candee Furbish, MD;  Location: [facility] ENDOSCOPY;  Service: Pulmonary;  Laterality: N/A;    HEMATOLOGY/ONCOLOGY HISTORY:  Oncology History Overview Note  Diagnosis: Stage IIB T2b N1 M0 adenocarcinoma of the LUL, poorly differentiated     Primary non-small cell carcinoma of upper lobe of left lung ([place])  06/13/2021 Initial Diagnosis  Primary non-small cell carcinoma of upper lobe of left lung [facility]) -06/20/2021 - 06/27/2021, patient presented to [facility] due to progressive headache/dizziness/gait changes. CT head showed acute to subacute intraparenchymal hemorrhage involving the left cerebellum.  Surrounding low-density vasogenic edema.  Patient was transferred to [facility]. This was further evaluated by CT angiogram of the neck which showed bulky calcified plaques/stenosis of carotid artery,  Followed by MRI brain. 06/12/2021, MRI of the brain showed no significant interval change in size of the left cerebellar intraparenchymal hematoma with unchanged regional mass effect and partial effacement of fourth ventricle but no upstream hydrocephalus. There is no discernible enhancement to suggest underlying mass lesion, though acute blood products could mask enhancement.   06/12/2021 a chest x-ray showed a 4.4 cm left middle lobe. 06/13/2021, CT chest with contrast showed a 4.3 x 3.6 x 3.3 cm lobular spiculated mass in the posterior left upper lobe with T3 to the lateral pleura and major fissure.  Metastatic left hilar lymphadenopathy.  Peripheral micronodularity posterior right costophrenic sulcus.  Aortic atherosclerosis. 06/14/2021, CT abdomen pelvis showed right lower lobe pulmonary artery embolus.  No evidence of right heart strain.  No acute intra-abdominal or pelvic pathology.  Aortic atherosclerosis.  06/13/2021, patient underwent bronchoscopy with EBUS by Dr. Tamala Julian.  Biopsy from the fine-needle aspiration of station 11 mL showed malignant cells, consistent with poorly differentiated non-small cell carcinoma, consistent with adenocarcinoma.  Malignant cells are TTF-1 positive and negative for p40.  Negative for neuroendocrine markers.  Blood test and tissue sample were tested for Gardant 360  -PD-L1 TPS 97%, no actionable mutation on the blood testing. 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   07/17/2021 Cancer Staging   Staging form: Lung, AJCC 8th Edition - Clinical: Stage IV (cT2b, cN1, cM1) - Signed by [name], MD on 08/06/2021   08/06/2021 Imaging   I saw patient's PET scan after his visit with me on 08/06/21. PET scan was ordered by his previous oncologist group and did not come to my in basket.. Patient received cycle 1 carboplatin Taxol today.  He has started on radiation.  5/26/3 PET scan showed 3.8 cm hypermetabolic left upper lobe mass with hypermetabolic left hilar  and infrahilar adenopathy.  There is approximately 8 scattered metastatic lesions in the skeleton. Mixed density photopenic lesion in the left cerebellum. characterized as hemorrhage on recent prior imaging workups.    08/06/2021 - 08/06/2021 Chemotherapy   Patient is on Treatment Plan : LUNG Carboplatin / Paclitaxel + XRT q7d     08/16/2021 -  Chemotherapy   Switched to systemic chemotherapy with  Carboplatin (4.5) + Pemetrexed (400) + Pembrolizumab (200) D1 q21d Induction x 4 cycles      08/16/2021 -  Chemotherapy   Patient is on Treatment Plan : LUNG Carboplatin (5) + Pemetrexed + Pembrolizumab (200) D1 q21d Induction x 4 cycles / Maintenance Pemetrexed + Pembrolizumab (200) D1 q21d     08/22/2021 Imaging   MRI brain w wo contrast  Decrease in size of left cerebellar hematoma with resolution of edema. No evidence of underlying lesion. Smaller, more recent hemorrhage in the left cerebellar vermis with minimal edema. There is minimal enhancement without definite evidence of underlying lesion.  New punctate focus of chronic blood products and enhancement in the left frontal lobe. Additional new foci of chronic blood products in the posterior right putamen, right parietal subcortical white matter, and posterior right cerebellum. Unclear at this time if these represent foci of bland hemorrhage or  early metastases.   Increase in size of right parietal osseous metastasis with minor extraosseous extension.    10/22/2021 - 10/22/2021 Chemotherapy   Patient is on Treatment Plan : LUNG Carboplatin (5) + Pemetrexed (500) + Pembrolizumab (200) D1 q21d Induction x 4 cycles / Maintenance Pemetrexed (500) + Pembrolizumab (200) D1 q21d      Imaging   PET scan showed 1. Interval response to therapy as evidenced by a small residual left upper lobe nodule with decreased hypermetabolism, no residual hypermetabolic adenopathy and decreased hypermetabolism associatedwith osseous metastases. 2. 6 mm posterior left  upper lobe nodule, likely stable. Recommend attention on follow-up. 3. Aortic atherosclerosis (ICD10-I70.0). Coronary artery calcification.   12/17/2021 Imaging   MRI lumbar spine w wo contrast  1. No evidence of regional metastatic disease. 2. Late subacute superior endplate fracture at L2 and large superior endplate Schmorl's node at L5, unchanged since the PET scan of 10/29/2021. 3. L3-4: Shallow disc protrusion. Facet and ligamentous hypertrophy. Stenosis of both lateral recesses. Findings slightly worsened since 2022. 4. L4-5: Shallow disc protrusion. Facet and ligamentous hypertrophy. Small synovial cyst arising from the facet joint on the right. Stenosis of the lateral recesses right worse than left. Foraminal narrowing right worse than left. Findings have worsened since 2022.  5. L5-S1: Endplate osteophytes and shallow protrusion of the disc. Facet and ligamentous hypertrophy. Stenosis of the subarticular lateral recesses and neural foramina, right worse than left. Similar appearance to the study of 2022. 6. Continued evidence of enteritis of at least 1 loop of small bowel. Distended bladder present   12/18/2021 Imaging   Brian MRI w wo  1. Interval decrease in size of the left cerebellar and vermian hematomas. No definite evidence of underlying metastatic lesion. 2. There is are two possible contrast enhancing lesions in the posterior left frontal lobe and left occipital lobe, which do not have intrinsic T1 signal abnormality, but have a somewhat linear appearance and may be vascular in nature. Recommend attention on follow up. 3. Multifocal sites of susceptibility artifact are redemonstrated with interval development of a few new foci, as described above. All of these sites demonstrate intrinsic T1 hyperintense signal abnormality and susceptibility artifact and are compatible with sites of microhemorrhages. Recommend continued attention on follow up. 4. Interval decrease in size of right  parietal calvarial metastatic lesion. No new contrast enhancing lesions visualized. 5. Diffusely heterogeneous marrow signal throughout the cervical spine, which is nonspecific but can be seen in the setting of anemia, smoking, obesity, or a marrow replacement process.     12/18/2021 Imaging   MRI lumbar spine w wo contrast  1. No evidence of regional metastatic disease. 2. Late subacute superior endplate fracture at L2 and large superior endplate Schmorl's node at L5, unchanged since the PET scan of 10/29/2021 3. L3-4: Shallow disc protrusion. Facet and ligamentous hypertrophy.Stenosis of both lateral recesses. Findings slightly worsened since 2022. 4. L4-5: Shallow disc protrusion. Facet and ligamentous hypertrophy. Small synovial cyst arising from the facet joint on the right. Stenosis of the lateral recesses right worse than left. Foraminal narrowing right worse than left. Findings have worsened since 2022. 5. L5-S1: Endplate osteophytes and shallow protrusion of the disc. Facet and ligamentous hypertrophy. Stenosis of the subarticular lateral recesses and neural foramina, right worse than left. Similar appearance to the study of 2022. 6. Continued evidence of enteritis of at least 1 loop of small bowel. Distended bladder present.       01/28/2022 Imaging   CT chest abdomen  pelvis w contrast 1. Treated left upper lobe mass appears grossly stable to the prior examination when measured in a similar fashion on the prior study. No definite signs of extra skeletal metastatic disease noted in the chest, abdomen or pelvis. 2. Widespread skeletal metastases redemonstrated, as above. 3. Hepatic steatosis. 4. Aortic atherosclerosis, in addition to left main and three-vessel coronary artery disease. Assessment for potential risk factor modification, dietary therapy or pharmacologic therapy may be warranted, if clinically indicated. 5. Additional incidental findings,      ALLERGIES:  is  allergic to niaspan [niacin].  MEDICATIONS:  Current Outpatient Medications  Medication Sig Dispense Refill   Ca Phosphate-Cholecalciferol (CALCIUM WITH D3 PO) Take 1 capsule by mouth daily.     doxycycline (VIBRA-TABS) 100 MG tablet Take 1 tablet (100 mg total) by mouth 2 (two) times daily for 10 days. 20 tablet 0   LORazepam (ATIVAN) 1 MG tablet Take 1 tablet (1 mg total) by mouth at bedtime. 30 tablet 2   NOVOLOG FLEXPEN 100 UNIT/ML FlexPen Inject 0-10 Units into the skin daily as needed for high blood sugar (above 200). Sliding scale     omeprazole (PRILOSEC) 20 MG capsule Take 20 mg by mouth daily.     oxyCODONE-acetaminophen (PERCOCET) 7.5-325 MG tablet Take 1 tablet by mouth every 4 (four) hours as needed for severe pain. Do not take with cough medication or other pain medication 30 tablet 0   predniSONE (DELTASONE) 20 MG tablet Take 3 tablets (60 mg total) by mouth daily with breakfast for 5 days, THEN 2 tablets (40 mg total) daily with breakfast for 5 days, THEN 1 tablet (20 mg total) daily with breakfast for 5 days. (Patient not taking: Reported on 02/21/2022) 30 tablet 0   tamsulosin (FLOMAX) 0.4 MG CAPS capsule TAKE 1 CAPSULE(0.4 MG) BY MOUTH DAILY 90 capsule 0   triamcinolone ointment (KENALOG) 0.5 % Apply 1 Application topically 2 (two) times daily. 30 g 0   UNABLE TO FIND PLEASE CHECK FASTING BLOOD GLUCOSE EVERY MORNING 1 each 0   aspirin EC 81 MG tablet Take 1 tablet (81 mg total) by mouth daily. Swallow whole. 30 tablet 12   atorvastatin (LIPITOR) 40 MG tablet Take 1 tablet (40 mg total) by mouth daily. 60 tablet 2   dexamethasone (DECADRON) 4 MG tablet Take 1 tab every 12 hours the day before pemetrexed chemo, then take 2 tabs once a day for 3 days starting the day after carboplatin. (Patient not taking: Reported on 02/17/2022) 30 tablet 1   furosemide (LASIX) 20 MG tablet Take 1 tablet (20 mg total) by mouth daily as needed (leg swelling or sob). (Patient not taking: Reported on  02/17/2022) 30 tablet 3   HYDROcodone bit-homatropine (HYCODAN) 5-1.5 MG/5ML syrup Take 5 mLs by mouth every 8 (eight) hours as needed for cough. (Patient not taking: Reported on 02/17/2022) 120 mL 0   No current facility-administered medications for this visit.   Facility-Administered Medications Ordered in Other Visits  Medication Dose Route Frequency Provider Last Rate Last Admin   heparin lock flush 100 UNIT/ML injection             VITAL SIGNS: BP 128/83 (BP Location: Left Arm, Patient Position: Sitting)   Pulse 89   Temp 97.9 F (36.6 C) (Tympanic)  There were no vitals filed for this visit.   Estimated body mass index is 22.74 kg/m as calculated from the following:   Height as of 02/17/22: _0  (1.702 m).   Weight  as of 02/21/22: 145 lb 3.2 oz (65.9 kg).  LABS: CBC:    Component Value Date/Time   WBC 8.4 02/17/2022 1449   HGB 8.5 (L) 02/17/2022 1449   HGB 13.4 07/17/2021 1345   HCT 25.4 (L) 02/17/2022 1449   PLT 109 (L) 02/17/2022 1449   PLT 373 07/17/2021 1345   MCV 101.2 (H) 02/17/2022 1449   NEUTROABS 6.6 02/17/2022 1449   LYMPHSABS 0.5 (L) 02/17/2022 1449   MONOABS 1.0 02/17/2022 1449   EOSABS 0.0 02/17/2022 1449   BASOSABS 0.0 02/17/2022 1449   Comprehensive Metabolic Panel:    Component Value Date/Time   NA 138 02/11/2022 1457   K 4.8 02/11/2022 1457   CL 102 02/11/2022 1457   CO2 24 02/11/2022 1457   BUN 15 02/11/2022 1457   CREATININE 1.07 02/11/2022 1457   GLUCOSE 225 (H) 02/11/2022 1457   CALCIUM 8.5 (L) 02/11/2022 1457   AST 73 (H) 02/11/2022 1457   AST 18 07/17/2021 1345   ALT 57 (H) 02/11/2022 1457   ALT 20 07/17/2021 1345   ALKPHOS 89 02/03/2022 0958   BILITOT 0.5 02/11/2022 1457   BILITOT 0.6 07/17/2021 1345   PROT 6.2 02/11/2022 1457   ALBUMIN 3.2 (L) 02/03/2022 0958    RADIOGRAPHIC STUDIES: DG Chest 2 View  Result Date: 02/15/2022 CLINICAL DATA:  Cough and congestion.  History of lung cancer. EXAM: CHEST - 2 VIEW COMPARISON:   11/18/2021 FINDINGS: Right jugular Port-A-Cath is stable with the tip near the superior cavoatrial junction. Stable patchy densities in the mid and lower left lung. No new airspace disease or consolidation. Heart size is normal. Atherosclerotic calcifications at the aortic arch. No pleural effusions. Stable compression deformity along the superior endplate of L2. IMPRESSION: 1. No acute cardiopulmonary disease. 2. Stable patchy densities in the mid and lower left lung. Electronically Signed   By: Markus Daft M.D.   On: 02/15/2022 09:17   CT CHEST ABDOMEN PELVIS W CONTRAST  Result Date: 01/28/2022 CLINICAL DATA:  71 year old male with history of non-small cell lung cancer. Restaging examination. * Tracking Code: BO * EXAM: CT CHEST, ABDOMEN, AND PELVIS WITH CONTRAST TECHNIQUE: Multidetector CT imaging of the chest, abdomen and pelvis was performed following the standard protocol during bolus administration of intravenous contrast. RADIATION DOSE REDUCTION: This exam was performed according to the departmental dose-optimization program which includes automated exposure control, adjustment of the mA and/or kV according to patient size and/or use of iterative reconstruction technique. CONTRAST:  123m OMNIPAQUE IOHEXOL 300 MG/ML  SOLN COMPARISON:  CT of the abdomen and pelvis 11/18/2021. PET-CT 10/29/2021. Chest CT 06/13/2021. FINDINGS: CT CHEST FINDINGS Cardiovascular: Heart size is normal. There is no significant pericardial fluid, thickening or pericardial calcification. There is aortic atherosclerosis, as well as atherosclerosis of the great vessels of the mediastinum and the coronary arteries, including calcified atherosclerotic plaque in the left main, left anterior descending, left circumflex and right coronary arteries. Right internal jugular single-lumen Port-A-Cath with tip terminating at the superior cavoatrial junction Mediastinum/Nodes: No pathologically enlarged mediastinal or hilar lymph nodes.  Prominent but nonenlarged left hilar lymph node measuring 7 mm (axial image 25 of series 2) noted. Esophagus is unremarkable in appearance. No axillary lymphadenopathy. Lungs/Pleura: Treated mass in the left upper lobe currently measures 3.9 x 2.2 cm and has spiculated margins with spiculations extending to the pleura both laterally and posteriorly, as well as a small amount of surrounding ground-glass attenuation and regional architectural distortion. No other definite new suspicious appearing pulmonary nodules  or masses are noted. No acute consolidative airspace disease. No pleural effusions. Musculoskeletal: Sclerotic lesion in the left lateral aspect of the superior endplate of Q49 (axial image 54 of series 2) measuring 2.1 x 1.1 cm, likely a healed metastasis. CT ABDOMEN PELVIS FINDINGS Hepatobiliary: Diffuse low attenuation throughout the hepatic parenchyma, indicative of a background of hepatic steatosis. No suspicious cystic or solid hepatic lesions. No intra or extrahepatic biliary ductal dilatation. Gallbladder is unremarkable in appearance. Pancreas: No pancreatic mass. No pancreatic ductal dilatation. No pancreatic or peripancreatic fluid collections or inflammatory changes. Spleen: Unremarkable. Adrenals/Urinary Tract: 1.8 cm low-attenuation lesion in the anterior aspect of the interpolar region of the left kidney (axial image 68 of series 2), compatible with a simple (Bosniak class 1) cyst. Other subcentimeter low-attenuation lesions in both kidneys, too small to definitively characterize, but statistically likely to represent cysts; no imaging follow-up for any of these lesions is recommended at this time. No aggressive appearing renal lesions. Bilateral adrenal glands are normal in appearance. No hydroureteronephrosis. Urinary bladder is normal in appearance. Stomach/Bowel: The appearance of the stomach is normal. There is no pathologic dilatation of small bowel or colon. Normal appendix.  Vascular/Lymphatic: Aortic atherosclerosis, without evidence of aneurysm or dissection in the abdominal or pelvic vasculature. No lymphadenopathy noted in the abdomen or pelvis. Reproductive: Prostate gland and seminal vesicles are unremarkable in appearance. Other: No significant volume of ascites.  No pneumoperitoneum. Musculoskeletal: Numerous predominantly lytic lesions are noted in the visualized skeleton, most evident in the superior endplate of L5 where there is a large lytic lesion measuring 2.4 cm in diameter. Small lytic lesion also noted in the superior endplate of L2 where there appears to be pathologic compression resulting in 25% loss of anterior vertebral body height. Mixed lucency and sclerosis noted in the bony pelvis, most evident in the right iliac wing. IMPRESSION: 1. Treated left upper lobe mass appears grossly stable to the prior examination when measured in a similar fashion on the prior study. No definite signs of extra skeletal metastatic disease noted in the chest, abdomen or pelvis. 2. Widespread skeletal metastases redemonstrated, as above. 3. Hepatic steatosis. 4. Aortic atherosclerosis, in addition to left main and three-vessel coronary artery disease. Assessment for potential risk factor modification, dietary therapy or pharmacologic therapy may be warranted, if clinically indicated. 5. Additional incidental findings, as above. Electronically Signed   By: Vinnie Langton M.D.   On: 01/28/2022 07:40    PERFORMANCE STATUS (ECOG) : 1 - Symptomatic but completely ambulatory  Review of Systems Unless otherwise noted, a complete review of systems is negative.  Vitals:   02/19/22 1352  BP: 128/83  Pulse: 89  Temp: 97.9 F (36.6 C)    Physical Exam General: NAD Cardiovascular: regular rate and rhythm Pulmonary: clear ant fields Abdomen: soft, nontender, + bowel sounds GU: no suprapubic tenderness Extremities: no edema, no joint deformities Skin: See below Neurological:  Weakness but otherwise nonfocal       Assessment and Plan- Patient is a 71 y.o. male    Encounter Diagnosis  Name Primary?   Leg swelling Yes   Worsening per patient even on doxycyline (though has only been on this medication for less than 48 hours). May be secondary to his Keytruda. Starting on prednisone and topical steroids. Reviewed side effects and need to check glucose. Has tolerated prednisone well in the past. Dermatology consult appreciated to attempt to determine cause of erythema. Discussed red flags.    Patient expressed understanding and was in  agreement with this plan. He also understands that He can call clinic at any time with any questions, concerns, or complaints.   Thank you for allowing me to participate in the care of this very pleasant patient.   Time Total: 25  Visit consisted of counseling and education dealing with the complex and emotionally intense issues of symptom management in the setting of serious illness.Greater than 50%  of this time was spent counseling and coordinating care related to the above assessment and plan.  Signed by: Nelwyn Salisbury, PA-C

## 2022-02-19 NOTE — Telephone Encounter (Signed)
Pt's wife sent the following message- Micheal Donovan's feet & legs are not much better. While the left one had appeared to be more swollen yesterday, today it is the right one.  He has been in bed (other than dr appt) with feet elevated for over a week now. He says they feel like they are going to burst if he tries to walk on them. He has even been using the bedside urinal rather than trying to get up to go to bathroom.

## 2022-02-20 DIAGNOSIS — M5416 Radiculopathy, lumbar region: Secondary | ICD-10-CM | POA: Diagnosis not present

## 2022-02-21 ENCOUNTER — Encounter: Payer: Self-pay | Admitting: Medical Oncology

## 2022-02-21 ENCOUNTER — Inpatient Hospital Stay (HOSPITAL_BASED_OUTPATIENT_CLINIC_OR_DEPARTMENT_OTHER): Payer: PPO | Admitting: Internal Medicine

## 2022-02-21 ENCOUNTER — Inpatient Hospital Stay (HOSPITAL_BASED_OUTPATIENT_CLINIC_OR_DEPARTMENT_OTHER): Payer: PPO | Admitting: Medical Oncology

## 2022-02-21 ENCOUNTER — Encounter: Payer: Self-pay | Admitting: Internal Medicine

## 2022-02-21 ENCOUNTER — Encounter: Payer: Self-pay | Admitting: Oncology

## 2022-02-21 VITALS — BP 137/75 | HR 81 | Temp 96.7°F | Resp 16 | Wt 145.2 lb

## 2022-02-21 DIAGNOSIS — M7989 Other specified soft tissue disorders: Secondary | ICD-10-CM

## 2022-02-21 DIAGNOSIS — I619 Nontraumatic intracerebral hemorrhage, unspecified: Secondary | ICD-10-CM

## 2022-02-21 DIAGNOSIS — Z5111 Encounter for antineoplastic chemotherapy: Secondary | ICD-10-CM | POA: Diagnosis not present

## 2022-02-21 MED ORDER — ATORVASTATIN CALCIUM 40 MG PO TABS: 40.0000 mg | ORAL_TABLET | Freq: Every day | ORAL | 2 refills | Status: DC

## 2022-02-21 MED ORDER — ASPIRIN 81 MG PO TBEC: 81.0000 mg | DELAYED_RELEASE_TABLET | Freq: Every day | ORAL | 12 refills | Status: DC

## 2022-02-21 NOTE — Progress Notes (Signed)
[facility] [place], [place] [address] [phone]   New Patient Evaluation  Date of Service: 02/21/22 Patient Name: EUELL SCHIFF Patient MRN: [id number] Patient DOB: 11/15/1950 Provider: Ventura Sellers, MD  Identifying Statement:  SOMTOCHUKWU WOOLLARD is a 71 y.o. male with Intraparenchymal hemorrhage of brain [facility]) - Plan: [place] who presents for initial consultation and evaluation regarding cancer associated neurologic deficits.    Referring Provider: [name], MD [address] [facility] [place],  [place] [address]  Primary Cancer:  Oncologic History: Oncology History Overview Note  Diagnosis: Stage IIB T2b N1 M0 adenocarcinoma of the LUL, poorly differentiated     Primary non-small cell carcinoma of upper lobe of left lung ([place])  06/13/2021 Initial Diagnosis   Primary non-small cell carcinoma of upper lobe of left lung [facility]) -06/20/2021 - 06/27/2021, patient presented to [facility] due to progressive headache/dizziness/gait changes. CT head showed acute to subacute intraparenchymal hemorrhage involving the left cerebellum.  Surrounding low-density vasogenic edema.  Patient was transferred to [facility]. This was further evaluated by CT angiogram of the neck which showed bulky calcified plaques/stenosis of carotid artery, Followed by MRI brain. 06/12/2021, MRI of the brain showed no significant interval change in size of the left cerebellar intraparenchymal hematoma with unchanged regional mass effect and partial effacement of fourth ventricle but no upstream hydrocephalus. There is no discernible enhancement to suggest underlying mass lesion, though acute blood products could mask enhancement.   06/12/2021 a chest x-ray showed a 4.4 cm left middle lobe. 06/13/2021, CT chest with contrast showed a 4.3 x 3.6 x 3.3 cm lobular spiculated mass in the posterior left upper lobe with T3 to the lateral pleura and  major fissure.  Metastatic left hilar lymphadenopathy.  Peripheral micronodularity posterior right costophrenic sulcus.  Aortic atherosclerosis. 06/14/2021, CT abdomen pelvis showed right lower lobe pulmonary artery embolus.  No evidence of right heart strain.  No acute intra-abdominal or pelvic pathology.  Aortic atherosclerosis.  06/13/2021, patient underwent bronchoscopy with EBUS by Dr. Tamala Julian.  Biopsy from the fine-needle aspiration of station 11 mL showed malignant cells, consistent with poorly differentiated non-small cell carcinoma, consistent with adenocarcinoma.  Malignant cells are TTF-1 positive and negative for p40.  Negative for neuroendocrine markers.  Blood test and tissue sample were tested for Gardant 360  -PD-L1 TPS 97%, no actionable mutation on the blood testing. 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   07/17/2021 Cancer Staging   Staging form: Lung, AJCC 8th Edition - Clinical: Stage IV (cT2b, cN1, cM1) - Signed by [name], MD on 08/06/2021   08/06/2021 Imaging   I saw patient's PET scan after his visit with me on 08/06/21. PET scan was ordered by his previous oncologist group and did not come to my in basket.. Patient received cycle 1 carboplatin Taxol today.  He has started on radiation.  5/26/3 PET scan showed 3.8 cm hypermetabolic left upper lobe mass with hypermetabolic left hilar and infrahilar adenopathy.  There is approximately 8 scattered metastatic lesions in the skeleton. Mixed density photopenic lesion in the left cerebellum. characterized as hemorrhage on recent prior imaging workups.    08/06/2021 - 08/06/2021 Chemotherapy   Patient is on Treatment Plan : LUNG Carboplatin / Paclitaxel + XRT q7d     08/16/2021 -  Chemotherapy   Switched to systemic chemotherapy with  Carboplatin (4.5) + Pemetrexed (400) + Pembrolizumab (200) D1 q21d  Induction x 4 cycles      08/16/2021 -  Chemotherapy   Patient is on Treatment Plan : LUNG Carboplatin (5) + Pemetrexed +  Pembrolizumab (200) D1 q21d Induction x 4 cycles / Maintenance Pemetrexed + Pembrolizumab (200) D1 q21d     08/22/2021 Imaging   MRI brain w wo contrast  Decrease in size of left cerebellar hematoma with resolution of edema. No evidence of underlying lesion. Smaller, more recent hemorrhage in the left cerebellar vermis with minimal edema. There is minimal enhancement without definite evidence of underlying lesion.  New punctate focus of chronic blood products and enhancement in the left frontal lobe. Additional new foci of chronic blood products in the posterior right putamen, right parietal subcortical white matter, and posterior right cerebellum. Unclear at this time if these represent foci of bland hemorrhage or early metastases.   Increase in size of right parietal osseous metastasis with minor extraosseous extension.    10/22/2021 - 10/22/2021 Chemotherapy   Patient is on Treatment Plan : LUNG Carboplatin (5) + Pemetrexed (500) + Pembrolizumab (200) D1 q21d Induction x 4 cycles / Maintenance Pemetrexed (500) + Pembrolizumab (200) D1 q21d      Imaging   PET scan showed 1. Interval response to therapy as evidenced by a small residual left upper lobe nodule with decreased hypermetabolism, no residual hypermetabolic adenopathy and decreased hypermetabolism associatedwith osseous metastases. 2. 6 mm posterior left upper lobe nodule, likely stable. Recommend attention on follow-up. 3. Aortic atherosclerosis (ICD10-I70.0). Coronary artery calcification.   12/17/2021 Imaging   MRI lumbar spine w wo contrast  1. No evidence of regional metastatic disease. 2. Late subacute superior endplate fracture at L2 and large superior endplate Schmorl's node at L5, unchanged since the PET scan of 10/29/2021. 3. L3-4: Shallow disc protrusion. Facet and ligamentous hypertrophy. Stenosis of both lateral recesses. Findings slightly worsened since 2022. 4. L4-5: Shallow disc protrusion. Facet and ligamentous  hypertrophy. Small synovial cyst arising from the facet joint on the right. Stenosis of the lateral recesses right worse than left. Foraminal narrowing right worse than left. Findings have worsened since 2022.  5. L5-S1: Endplate osteophytes and shallow protrusion of the disc. Facet and ligamentous hypertrophy. Stenosis of the subarticular lateral recesses and neural foramina, right worse than left. Similar appearance to the study of 2022. 6. Continued evidence of enteritis of at least 1 loop of small bowel. Distended bladder present   12/18/2021 Imaging   [facility] MRI w wo  1. Interval decrease in size of the left cerebellar and vermian hematomas. No definite evidence of underlying metastatic lesion. 2. There is are two possible contrast enhancing lesions in the posterior left frontal lobe and left occipital lobe, which do not have intrinsic T1 signal abnormality, but have a somewhat linear appearance and may be vascular in nature. Recommend attention on follow up. 3. Multifocal sites of susceptibility artifact are redemonstrated with interval development of a few new foci, as described above. All of these sites demonstrate intrinsic T1 hyperintense signal abnormality and susceptibility artifact and are compatible with sites of microhemorrhages. Recommend continued attention on follow up. 4. Interval decrease in size of right parietal calvarial metastatic lesion. No new contrast enhancing lesions visualized. 5. Diffusely heterogeneous marrow signal throughout the cervical spine, which is nonspecific but can be seen in the setting of anemia, smoking, obesity, or a marrow replacement process.     12/18/2021 Imaging   MRI lumbar spine w wo contrast  1. No evidence of regional metastatic disease. 2.  Late subacute superior endplate fracture at L2 and large superior endplate Schmorl's node at L5, unchanged since the PET scan of 10/29/2021 3. L3-4: Shallow disc protrusion. Facet and ligamentous  hypertrophy.Stenosis of both lateral recesses. Findings slightly worsened since 2022. 4. L4-5: Shallow disc protrusion. Facet and ligamentous hypertrophy. Small synovial cyst arising from the facet joint on the right. Stenosis of the lateral recesses right worse than left. Foraminal narrowing right worse than left. Findings have worsened since 2022. 5. L5-S1: Endplate osteophytes and shallow protrusion of the disc. Facet and ligamentous hypertrophy. Stenosis of the subarticular lateral recesses and neural foramina, right worse than left. Similar appearance to the study of 2022. 6. Continued evidence of enteritis of at least 1 loop of small bowel. Distended bladder present.       01/28/2022 Imaging   CT chest abdomen pelvis w contrast 1. Treated left upper lobe mass appears grossly stable to the prior examination when measured in a similar fashion on the prior study. No definite signs of extra skeletal metastatic disease noted in the chest, abdomen or pelvis. 2. Widespread skeletal metastases redemonstrated, as above. 3. Hepatic steatosis. 4. Aortic atherosclerosis, in addition to left main and three-vessel coronary artery disease. Assessment for potential risk factor modification, dietary therapy or pharmacologic therapy may be warranted, if clinically indicated. 5. Additional incidental findings,      History of Present Illness: The patient's records from the referring physician were obtained and reviewed and the patient interviewed to confirm this HPI.  Zada Finders presents for evaluation today for stroke history.  He presented initially in April 2023 with episode of sudden onset dizziness, inability to walk.  Following workup, he returned gradually to baseline functional/gait status after 2 months of intense rehab.  Lung cancer was also diagnosed in this time period, in addition to pulmonary embolism.  He was discharged on low dose Eliquis, but his has been discontinued by Dr. Tasia Catchings given  ongoing CNS blood products.  Aside from recent issues with lower legs, swelling and rash, his gait and functional status have been independent.  He is not taking aspirin, statin, or blood pressure medications currently.  Medications: Current Outpatient Medications on File Prior to Visit  Medication Sig Dispense Refill   Ca Phosphate-Cholecalciferol (CALCIUM WITH D3 PO) Take 1 capsule by mouth daily.     doxycycline (VIBRA-TABS) 100 MG tablet Take 1 tablet (100 mg total) by mouth 2 (two) times daily for 10 days. 20 tablet 0   LORazepam (ATIVAN) 1 MG tablet Take 1 tablet (1 mg total) by mouth at bedtime. 30 tablet 2   NOVOLOG FLEXPEN 100 UNIT/ML FlexPen Inject 0-10 Units into the skin daily as needed for high blood sugar (above 200). Sliding scale     omeprazole (PRILOSEC) 20 MG capsule Take 20 mg by mouth daily.     oxyCODONE-acetaminophen (PERCOCET) 7.5-325 MG tablet Take 1 tablet by mouth every 4 (four) hours as needed for severe pain. Do not take with cough medication or other pain medication 30 tablet 0   predniSONE (DELTASONE) 20 MG tablet Take 3 tablets (60 mg total) by mouth daily with breakfast for 5 days, THEN 2 tablets (40 mg total) daily with breakfast for 5 days, THEN 1 tablet (20 mg total) daily with breakfast for 5 days. (Patient not taking: Reported on 02/21/2022) 30 tablet 0   tamsulosin (FLOMAX) 0.4 MG CAPS capsule TAKE 1 CAPSULE(0.4 MG) BY MOUTH DAILY 90 capsule 0   triamcinolone ointment (KENALOG) 0.5 % Apply 1  Application topically 2 (two) times daily. 30 g 0   UNABLE TO FIND PLEASE CHECK FASTING BLOOD GLUCOSE EVERY MORNING 1 each 0   dexamethasone (DECADRON) 4 MG tablet Take 1 tab every 12 hours the day before pemetrexed chemo, then take 2 tabs once a day for 3 days starting the day after carboplatin. (Patient not taking: Reported on 02/17/2022) 30 tablet 1   furosemide (LASIX) 20 MG tablet Take 1 tablet (20 mg total) by mouth daily as needed (leg swelling or sob). (Patient not  taking: Reported on 02/17/2022) 30 tablet 3   HYDROcodone bit-homatropine (HYCODAN) 5-1.5 MG/5ML syrup Take 5 mLs by mouth every 8 (eight) hours as needed for cough. (Patient not taking: Reported on 02/17/2022) 120 mL 0   [DISCONTINUED] prochlorperazine (COMPAZINE) 10 MG tablet Take 1 tablet (10 mg total) by mouth every 6 (six) hours as needed (Nausea or vomiting). 30 tablet 1   Current Facility-Administered Medications on File Prior to Visit  Medication Dose Route Frequency Provider Last Rate Last Admin   heparin lock flush 100 UNIT/ML injection             Allergies:  Allergies  Allergen Reactions   Niaspan [Niacin]     FLUSHING   Past Medical History:  Past Medical History:  Diagnosis Date   Allergy    BPH (benign prostatic hypertrophy)    Cancer (HCC)    Melanoma on Neck    2008   Carotid artery occlusion    Diabetes mellitus    type 2   ED (erectile dysfunction)    GERD (gastroesophageal reflux disease)    Hemorrhagic stroke ([place]) 06/2021   Hyperlipidemia    Hypertension    Lung cancer ([place])    Retinopathy due to secondary DM [facility])    Past Surgical History:  Past Surgical History:  Procedure Laterality Date   BRONCHIAL NEEDLE ASPIRATION BIOPSY  06/17/2021   Procedure: BRONCHIAL NEEDLE ASPIRATION BIOPSIES;  Surgeon: Candee Furbish, MD;  Location: [place];  Service: Pulmonary;;   IR IMAGING GUIDED PORT INSERTION  08/05/2021   MELANOMA EXCISION  2008   Left side of neck   RADIOLOGY WITH ANESTHESIA N/A 04/05/2020   Procedure: MRI SPINE WITOUT CONTRAST;  Surgeon: Radiologist, Medication, MD;  Location: [place];  Service: Radiology;  Laterality: N/A;   RADIOLOGY WITH ANESTHESIA N/A 08/22/2021   Procedure: MRI BRAIN WITH AND WITHOUT CONTRAST  WITH ANESTHESIA;  Surgeon: Radiologist, Medication, MD;  Location: [place];  Service: Radiology;  Laterality: N/A;   RADIOLOGY WITH ANESTHESIA N/A 12/17/2021   Procedure: MRI BRAIN WITH AND WITHOUT CONTRAST WITH ANESTHESIA; MRI LUMBER  WITH AND WITHOUT CONTRAST;  Surgeon: Radiologist, Medication, MD;  Location: [place];  Service: Radiology;  Laterality: N/A;   TONSILLECTOMY     VIDEO BRONCHOSCOPY WITH ENDOBRONCHIAL ULTRASOUND N/A 06/17/2021   Procedure: VIDEO BRONCHOSCOPY WITH ENDOBRONCHIAL ULTRASOUND;  Surgeon: Candee Furbish, MD;  Location: [facility] ENDOSCOPY;  Service: Pulmonary;  Laterality: N/A;   Social History:  Social History   Socioeconomic History   Marital status: Married    Spouse name: Not on file   Number of children: Not on file   Years of education: Not on file   Highest education level: Not on file  Occupational History   Not on file  Tobacco Use   Smoking status: Former    Types: Cigarettes    Quit date: 07/21/1990    Years since quitting: 31.6   Smokeless tobacco: Never  Vaping Use  Vaping Use: Never used  Substance and Sexual Activity   Alcohol use: No   Drug use: No   Sexual activity: Not on file  Other Topics Concern   Not on file  Social History Narrative   Not on file   Social Determinants of Health   Financial Resource Strain: Low Risk  (11/21/2021)   Overall Financial Resource Strain (CARDIA)    Difficulty of Paying Living Expenses: Not hard at all  Food Insecurity: No Food Insecurity (11/19/2021)   Hunger Vital Sign    Worried About Running Out of Food in the Last Year: Never true    Ran Out of Food in the Last Year: Never true  Transportation Needs: No Transportation Needs (11/21/2021)   PRAPARE - Hydrologist (Medical): No    Lack of Transportation (Non-Medical): No  Physical Activity: Inactive (07/31/2021)   Exercise Vital Sign    Days of Exercise per Week: 0 days    Minutes of Exercise per Session: 0 min  Stress: Unknown (07/31/2021)   [place]    Feeling of Stress : Patient refused  Social Connections: Unknown (07/31/2021)   Social Connection and Isolation Panel [NHANES]     Frequency of Communication with Friends and Family: Three times a week    Frequency of Social Gatherings with Friends and Family: Three times a week    Attends Religious Services: Patient refused    Active Member of Clubs or Organizations: Patient refused    Attends Archivist Meetings: Patient refused    Marital Status: Married  Human resources officer Violence: Not At Risk (07/31/2021)   Humiliation, Afraid, Rape, and Kick questionnaire    Fear of Current or Ex-Partner: No    Emotionally Abused: No    Physically Abused: No    Sexually Abused: No   Family History:  Family History  Problem Relation Age of Onset   COPD Mother    Heart disease Father    Heart disease Brother        MI at age 83   Stroke Neg Hx     Review of Systems: Constitutional: Doesn't report fevers, chills or abnormal weight loss Eyes: Doesn't report blurriness of vision Ears, nose, mouth, throat, and face: Doesn't report sore throat Respiratory: Doesn't report cough, dyspnea or wheezes Cardiovascular: Doesn't report palpitation, chest discomfort  Gastrointestinal:  Doesn't report nausea, constipation, diarrhea GU: Doesn't report incontinence Skin: Doesn't report skin rashes Neurological: Per HPI Musculoskeletal: Doesn't report joint pain Behavioral/Psych: Doesn't report anxiety  Physical Exam: Vitals:   02/21/22 1000  BP: 137/75  Pulse: 81  Resp: 16  Temp: (!) 96.7 F (35.9 C)  SpO2: 100%   KPS: 80. General: Alert, cooperative, pleasant, in no acute distress Head: Normal EENT: No conjunctival injection or scleral icterus.  Lungs: Resp effort normal Cardiac: Regular rate Abdomen: Non-distended abdomen Skin: Erythema lower legs Extremities: No clubbing or edema  Neurologic Exam: Mental Status: Awake, alert, attentive to examiner. Oriented to self and environment. Language is fluent with intact comprehension.  Cranial Nerves: Visual acuity is grossly normal. Visual fields are full.  Extra-ocular movements intact. No ptosis. Face is symmetric Motor: Tone and bulk are normal. Power is full in both arms and legs. Reflexes are symmetric, no pathologic reflexes present.  Sensory: Intact to light touch Gait: Normal.   Labs: I have reviewed the data as listed    Component Value Date/Time   NA 138 02/11/2022  1457   K 4.8 02/11/2022 1457   CL 102 02/11/2022 1457   CO2 24 02/11/2022 1457   GLUCOSE 225 (H) 02/11/2022 1457   BUN 15 02/11/2022 1457   CREATININE 1.07 02/11/2022 1457   CALCIUM 8.5 (L) 02/11/2022 1457   PROT 6.2 02/11/2022 1457   ALBUMIN 3.2 (L) 02/03/2022 0958   AST 73 (H) 02/11/2022 1457   AST 18 07/17/2021 1345   ALT 57 (H) 02/11/2022 1457   ALT 20 07/17/2021 1345   ALKPHOS 89 02/03/2022 0958   BILITOT 0.5 02/11/2022 1457   BILITOT 0.6 07/17/2021 1345   GFRNONAA >60 02/03/2022 0958   GFRNONAA >60 07/17/2021 1345   GFRNONAA 88 03/05/2016 0824   GFRAA >89 03/05/2016 0824   Lab Results  Component Value Date   WBC 8.4 02/17/2022   NEUTROABS 6.6 02/17/2022   HGB 8.5 (L) 02/17/2022   HCT 25.4 (L) 02/17/2022   MCV 101.2 (H) 02/17/2022   PLT 109 (L) 02/17/2022    Imaging:  DG Chest 2 View  Result Date: 02/15/2022 CLINICAL DATA:  Cough and congestion.  History of lung cancer. EXAM: CHEST - 2 VIEW COMPARISON:  11/18/2021 FINDINGS: Right jugular Port-A-Cath is stable with the tip near the superior cavoatrial junction. Stable patchy densities in the mid and lower left lung. No new airspace disease or consolidation. Heart size is normal. Atherosclerotic calcifications at the aortic arch. No pleural effusions. Stable compression deformity along the superior endplate of L2. IMPRESSION: 1. No acute cardiopulmonary disease. 2. Stable patchy densities in the mid and lower left lung. Electronically Signed   By: Markus Daft M.D.   On: 02/15/2022 09:17   CT CHEST ABDOMEN PELVIS W CONTRAST  Result Date: 01/28/2022 CLINICAL DATA:  71 year old male with history of  non-small cell lung cancer. Restaging examination. * Tracking Code: BO * EXAM: CT CHEST, ABDOMEN, AND PELVIS WITH CONTRAST TECHNIQUE: Multidetector CT imaging of the chest, abdomen and pelvis was performed following the standard protocol during bolus administration of intravenous contrast. RADIATION DOSE REDUCTION: This exam was performed according to the departmental dose-optimization program which includes automated exposure control, adjustment of the mA and/or kV according to patient size and/or use of iterative reconstruction technique. CONTRAST:  157m OMNIPAQUE IOHEXOL 300 MG/ML  SOLN COMPARISON:  CT of the abdomen and pelvis 11/18/2021. PET-CT 10/29/2021. Chest CT 06/13/2021. FINDINGS: CT CHEST FINDINGS Cardiovascular: Heart size is normal. There is no significant pericardial fluid, thickening or pericardial calcification. There is aortic atherosclerosis, as well as atherosclerosis of the great vessels of the mediastinum and the coronary arteries, including calcified atherosclerotic plaque in the left main, left anterior descending, left circumflex and right coronary arteries. Right internal jugular single-lumen Port-A-Cath with tip terminating at the superior cavoatrial junction Mediastinum/Nodes: No pathologically enlarged mediastinal or hilar lymph nodes. Prominent but nonenlarged left hilar lymph node measuring 7 mm (axial image 25 of series 2) noted. Esophagus is unremarkable in appearance. No axillary lymphadenopathy. Lungs/Pleura: Treated mass in the left upper lobe currently measures 3.9 x 2.2 cm and has spiculated margins with spiculations extending to the pleura both laterally and posteriorly, as well as a small amount of surrounding ground-glass attenuation and regional architectural distortion. No other definite new suspicious appearing pulmonary nodules or masses are noted. No acute consolidative airspace disease. No pleural effusions. Musculoskeletal: Sclerotic lesion in the left lateral aspect  of the superior endplate of TT97(axial image 54 of series 2) measuring 2.1 x 1.1 cm, likely a healed metastasis. CT ABDOMEN PELVIS FINDINGS  Hepatobiliary: Diffuse low attenuation throughout the hepatic parenchyma, indicative of a background of hepatic steatosis. No suspicious cystic or solid hepatic lesions. No intra or extrahepatic biliary ductal dilatation. Gallbladder is unremarkable in appearance. Pancreas: No pancreatic mass. No pancreatic ductal dilatation. No pancreatic or peripancreatic fluid collections or inflammatory changes. Spleen: Unremarkable. Adrenals/Urinary Tract: 1.8 cm low-attenuation lesion in the anterior aspect of the interpolar region of the left kidney (axial image 68 of series 2), compatible with a simple (Bosniak class 1) cyst. Other subcentimeter low-attenuation lesions in both kidneys, too small to definitively characterize, but statistically likely to represent cysts; no imaging follow-up for any of these lesions is recommended at this time. No aggressive appearing renal lesions. Bilateral adrenal glands are normal in appearance. No hydroureteronephrosis. Urinary bladder is normal in appearance. Stomach/Bowel: The appearance of the stomach is normal. There is no pathologic dilatation of small bowel or colon. Normal appendix. Vascular/Lymphatic: Aortic atherosclerosis, without evidence of aneurysm or dissection in the abdominal or pelvic vasculature. No lymphadenopathy noted in the abdomen or pelvis. Reproductive: Prostate gland and seminal vesicles are unremarkable in appearance. Other: No significant volume of ascites.  No pneumoperitoneum. Musculoskeletal: Numerous predominantly lytic lesions are noted in the visualized skeleton, most evident in the superior endplate of L5 where there is a large lytic lesion measuring 2.4 cm in diameter. Small lytic lesion also noted in the superior endplate of L2 where there appears to be pathologic compression resulting in 25% loss of anterior  vertebral body height. Mixed lucency and sclerosis noted in the bony pelvis, most evident in the right iliac wing. IMPRESSION: 1. Treated left upper lobe mass appears grossly stable to the prior examination when measured in a similar fashion on the prior study. No definite signs of extra skeletal metastatic disease noted in the chest, abdomen or pelvis. 2. Widespread skeletal metastases redemonstrated, as above. 3. Hepatic steatosis. 4. Aortic atherosclerosis, in addition to left main and three-vessel coronary artery disease. Assessment for potential risk factor modification, dietary therapy or pharmacologic therapy may be warranted, if clinically indicated. 5. Additional incidental findings, as above. Electronically Signed   By: Vinnie Langton M.D.   On: 01/28/2022 07:40     Assessment/Plan Intraparenchymal hemorrhage of brain [facility] [facility]) - Plan: MR BRAIN [place] presents with clinical and radiographic syndrome consistent with multifocal hemorrhagic infarcts.  The primary hemorrhage in posterior fossa has metabolized over time, without apparent underlying neoplasm/metastasis.  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    We reviewed his stroke workup from April.  He has several risk factors, including hyperlipidemia, diabetes, carotid stenosis, impaired cardiac ejection fraction.    Recommended initiating secondary prevention with Aspirin 61m daily and Atorvastatin 440mdaily.  He will con't managing diabetes with insulin, diet.  We do not recommend resuming anticoagulation given recurrent hemorrhagic foci.  Several tiny enhancing lesions were visible on the black blood sequence on most recent MRI.  We will recommend circling back and re-evaluating those with MRI surveillance to exclude NSCLC metastatic foci.   Will otherwise con't with chemotherapy and lung cancer management with Dr. YuTasia Catchings We  spent twenty additional minutes teaching regarding the natural history, biology, and historical experience in the treatment of neurologic complications of cancer.   We appreciate the opportunity to participate in the care of EdDELBERT DARLEY  We ask that EdJERRIK HOUSHOLDEReturn to clinic in 2 months following next brain MRI,  or sooner as needed.  All questions were answered. The patient knows to call the clinic with any problems, questions or concerns. No barriers to learning were detected.  The total time spent in the encounter was 40 minutes and more than 50% was on counseling and review of test results   Ventura Sellers, MD Medical Director of Neuro-Oncology [facility] 02/21/22 10:50 AM

## 2022-02-21 NOTE — Progress Notes (Signed)
Referred by Dr. Tasia Catchings for MRI brain shows persistent microbleeds   Received IM Rocephin 1gm on 02/20/22 at [facility], Dr. [place] Crane.

## 2022-02-21 NOTE — Progress Notes (Signed)
Received IM Rocephin 1gm on 02/20/22 at [facility], Dr. Nevada Crane.

## 2022-02-21 NOTE — Progress Notes (Signed)
Symptom Management [place] at [facility] [phone] [phone]  Patient Care Team: [name], MD as PCP - General (Family Medicine) [name], RN as Oncology Nurse Navigator   Name of the patient: Micheal Donovan  [id number]  Jul 26, 1950   Date of visit: 02/21/22  Reason for Consult: Micheal Donovan is a 71 y.o. male with stage IV non-small cell carcinoma of the upper lobe of left lung currently on Keytruda/Altima who presents today for:  Leg swelling: Originally started on doxycyline. Then prednisone as this was worsening. He was also referred to dermatology who administered Rocephin on 02/20/2022 and increased the strength of his topical steriod cream. He has not started the prednisone yet but plans to after our appointment. He reports that he is feeling better today after the Rocephin and topical steroid. No fevers, pain significantly less, less swelling of feet.   Denies any neurologic complaints. Denies recent fevers or illnesses. Denies any easy bleeding or bruising. Reports good appetite and denies weight loss. Denies chest pain. Denies any nausea, vomiting, constipation, or diarrhea. Denies urinary complaints. Patient offers no further specific complaints today.  Wt Readings from Last 3 Encounters:  02/21/22 145 lb 3.2 oz (65.9 kg)  02/17/22 146 lb (66.2 kg)  02/14/22 147 lb 12.8 oz (67 kg)     PAST MEDICAL HISTORY: Past Medical History:  Diagnosis Date   Allergy    BPH (benign prostatic hypertrophy)    Cancer (HCC)    Melanoma on Neck    2008   Carotid artery occlusion    Diabetes mellitus    type 2   ED (erectile dysfunction)    GERD (gastroesophageal reflux disease)    Hemorrhagic stroke ([place]) 06/2021   Hyperlipidemia    Hypertension    Lung cancer ([place])    Retinopathy due to secondary DM ([place])     PAST SURGICAL HISTORY:  Past Surgical History:  Procedure Laterality Date   BRONCHIAL NEEDLE  ASPIRATION BIOPSY  06/17/2021   Procedure: BRONCHIAL NEEDLE ASPIRATION BIOPSIES;  Surgeon: Candee Furbish, MD;  Location: [facility] ENDOSCOPY;  Service: Pulmonary;;   IR IMAGING GUIDED PORT INSERTION  08/05/2021   MELANOMA EXCISION  2008   Left side of neck   RADIOLOGY WITH ANESTHESIA N/A 04/05/2020   Procedure: MRI SPINE WITOUT CONTRAST;  Surgeon: Radiologist, Medication, MD;  Location: [place] [facility];  Service: Radiology;  Laterality: N/A;   RADIOLOGY WITH ANESTHESIA N/A 08/22/2021   Procedure: MRI BRAIN WITH AND WITHOUT CONTRAST  WITH ANESTHESIA;  Surgeon: Radiologist, Medication, MD;  Location: [place];  Service: Radiology;  Laterality: N/A;   RADIOLOGY WITH ANESTHESIA N/A 12/17/2021   Procedure: MRI BRAIN WITH AND WITHOUT CONTRAST WITH ANESTHESIA; MRI LUMBER WITH AND WITHOUT CONTRAST;  Surgeon: Radiologist, Medication, MD;  Location: [place];  Service: Radiology;  Laterality: N/A;   TONSILLECTOMY     VIDEO BRONCHOSCOPY WITH ENDOBRONCHIAL ULTRASOUND N/A 06/17/2021   Procedure: VIDEO BRONCHOSCOPY WITH ENDOBRONCHIAL ULTRASOUND;  Surgeon: Candee Furbish, MD;  Location: [facility] ENDOSCOPY;  Service: Pulmonary;  Laterality: N/A;    HEMATOLOGY/ONCOLOGY HISTORY:  Oncology History Overview Note  Diagnosis: Stage IIB T2b N1 M0 adenocarcinoma of the LUL, poorly differentiated     Primary non-small cell carcinoma of upper lobe of left lung ([place])  06/13/2021 Initial Diagnosis   Primary non-small cell carcinoma of upper lobe of left lung [facility]) -06/20/2021 - 06/27/2021, patient presented to [facility] due to progressive headache/dizziness/gait changes. CT head showed acute to  subacute intraparenchymal hemorrhage involving the left cerebellum.  Surrounding low-density vasogenic edema.  Patient was transferred to [facility]. This was further evaluated by CT angiogram of the neck which showed bulky calcified plaques/stenosis of carotid artery, Followed by MRI brain. 06/12/2021, MRI of the brain showed no significant interval  change in size of the left cerebellar intraparenchymal hematoma with unchanged regional mass effect and partial effacement of fourth ventricle but no upstream hydrocephalus. There is no discernible enhancement to suggest underlying mass lesion, though acute blood products could mask enhancement.   06/12/2021 a chest x-ray showed a 4.4 cm left middle lobe. 06/13/2021, CT chest with contrast showed a 4.3 x 3.6 x 3.3 cm lobular spiculated mass in the posterior left upper lobe with T3 to the lateral pleura and major fissure.  Metastatic left hilar lymphadenopathy.  Peripheral micronodularity posterior right costophrenic sulcus.  Aortic atherosclerosis. 06/14/2021, CT abdomen pelvis showed right lower lobe pulmonary artery embolus.  No evidence of right heart strain.  No acute intra-abdominal or pelvic pathology.  Aortic atherosclerosis.  06/13/2021, patient underwent bronchoscopy with EBUS by Dr. Tamala Julian.  Biopsy from the fine-needle aspiration of station 11 mL showed malignant cells, consistent with poorly differentiated non-small cell carcinoma, consistent with adenocarcinoma.  Malignant cells are TTF-1 positive and negative for p40.  Negative for neuroendocrine markers.  Blood test and tissue sample were tested for Gardant 360  -PD-L1 TPS 97%, no actionable mutation on the blood testing. 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   07/17/2021 Cancer Staging   Staging form: Lung, AJCC 8th Edition - Clinical: Stage IV (cT2b, cN1, cM1) - Signed by [name], MD on 08/06/2021   08/06/2021 Imaging   I saw patient's PET scan after his visit with me on 08/06/21. PET scan was ordered by his previous oncologist group and did not come to my in basket.. Patient received cycle 1 carboplatin Taxol today.  He has started on radiation.  5/26/3 PET scan showed 3.8 cm hypermetabolic left upper lobe mass with hypermetabolic left hilar and infrahilar adenopathy.  There is approximately 8 scattered metastatic lesions in  the skeleton. Mixed density photopenic lesion in the left cerebellum. characterized as hemorrhage on recent prior imaging workups.    08/06/2021 - 08/06/2021 Chemotherapy   Patient is on Treatment Plan : LUNG Carboplatin / Paclitaxel + XRT q7d     08/16/2021 -  Chemotherapy   Switched to systemic chemotherapy with  Carboplatin (4.5) + Pemetrexed (400) + Pembrolizumab (200) D1 q21d Induction x 4 cycles      08/16/2021 -  Chemotherapy   Patient is on Treatment Plan : LUNG Carboplatin (5) + Pemetrexed + Pembrolizumab (200) D1 q21d Induction x 4 cycles / Maintenance Pemetrexed + Pembrolizumab (200) D1 q21d     08/22/2021 Imaging   MRI brain w wo contrast  Decrease in size of left cerebellar hematoma with resolution of edema. No evidence of underlying lesion. Smaller, more recent hemorrhage in the left cerebellar vermis with minimal edema. There is minimal enhancement without definite evidence of underlying lesion.  New punctate focus of chronic blood products and enhancement in the left frontal lobe. Additional new foci of chronic blood products in the posterior right putamen, right parietal subcortical white matter, and posterior right cerebellum. Unclear at this time if these represent foci of bland hemorrhage or early metastases.   Increase in size of right parietal osseous metastasis with minor extraosseous extension.    10/22/2021 - 10/22/2021 Chemotherapy   Patient is on Treatment  Plan : LUNG Carboplatin (5) + Pemetrexed (500) + Pembrolizumab (200) D1 q21d Induction x 4 cycles / Maintenance Pemetrexed (500) + Pembrolizumab (200) D1 q21d      Imaging   PET scan showed 1. Interval response to therapy as evidenced by a small residual left upper lobe nodule with decreased hypermetabolism, no residual hypermetabolic adenopathy and decreased hypermetabolism associatedwith osseous metastases. 2. 6 mm posterior left upper lobe nodule, likely stable. Recommend attention on follow-up. 3. Aortic  atherosclerosis (ICD10-I70.0). Coronary artery calcification.   12/17/2021 Imaging   MRI lumbar spine w wo contrast  1. No evidence of regional metastatic disease. 2. Late subacute superior endplate fracture at L2 and large superior endplate Schmorl's node at L5, unchanged since the PET scan of 10/29/2021. 3. L3-4: Shallow disc protrusion. Facet and ligamentous hypertrophy. Stenosis of both lateral recesses. Findings slightly worsened since 2022. 4. L4-5: Shallow disc protrusion. Facet and ligamentous hypertrophy. Small synovial cyst arising from the facet joint on the right. Stenosis of the lateral recesses right worse than left. Foraminal narrowing right worse than left. Findings have worsened since 2022.  5. L5-S1: Endplate osteophytes and shallow protrusion of the disc. Facet and ligamentous hypertrophy. Stenosis of the subarticular lateral recesses and neural foramina, right worse than left. Similar appearance to the study of 2022. 6. Continued evidence of enteritis of at least 1 loop of small bowel. Distended bladder present   12/18/2021 Imaging   Brian MRI w wo  1. Interval decrease in size of the left cerebellar and vermian hematomas. No definite evidence of underlying metastatic lesion. 2. There is are two possible contrast enhancing lesions in the posterior left frontal lobe and left occipital lobe, which do not have intrinsic T1 signal abnormality, but have a somewhat linear appearance and may be vascular in nature. Recommend attention on follow up. 3. Multifocal sites of susceptibility artifact are redemonstrated with interval development of a few new foci, as described above. All of these sites demonstrate intrinsic T1 hyperintense signal abnormality and susceptibility artifact and are compatible with sites of microhemorrhages. Recommend continued attention on follow up. 4. Interval decrease in size of right parietal calvarial metastatic lesion. No new contrast enhancing lesions  visualized. 5. Diffusely heterogeneous marrow signal throughout the cervical spine, which is nonspecific but can be seen in the setting of anemia, smoking, obesity, or a marrow replacement process.     12/18/2021 Imaging   MRI lumbar spine w wo contrast  1. No evidence of regional metastatic disease. 2. Late subacute superior endplate fracture at L2 and large superior endplate Schmorl's node at L5, unchanged since the PET scan of 10/29/2021 3. L3-4: Shallow disc protrusion. Facet and ligamentous hypertrophy.Stenosis of both lateral recesses. Findings slightly worsened since 2022. 4. L4-5: Shallow disc protrusion. Facet and ligamentous hypertrophy. Small synovial cyst arising from the facet joint on the right. Stenosis of the lateral recesses right worse than left. Foraminal narrowing right worse than left. Findings have worsened since 2022. 5. L5-S1: Endplate osteophytes and shallow protrusion of the disc. Facet and ligamentous hypertrophy. Stenosis of the subarticular lateral recesses and neural foramina, right worse than left. Similar appearance to the study of 2022. 6. Continued evidence of enteritis of at least 1 loop of small bowel. Distended bladder present.       01/28/2022 Imaging   CT chest abdomen pelvis w contrast 1. Treated left upper lobe mass appears grossly stable to the prior examination when measured in a similar fashion on the prior study. No definite signs  of extra skeletal metastatic disease noted in the chest, abdomen or pelvis. 2. Widespread skeletal metastases redemonstrated, as above. 3. Hepatic steatosis. 4. Aortic atherosclerosis, in addition to left main and three-vessel coronary artery disease. Assessment for potential risk factor modification, dietary therapy or pharmacologic therapy may be warranted, if clinically indicated. 5. Additional incidental findings,      ALLERGIES:  is allergic to niaspan [niacin].  MEDICATIONS:  Current Outpatient Medications   Medication Sig Dispense Refill   Ca Phosphate-Cholecalciferol (CALCIUM WITH D3 PO) Take 1 capsule by mouth daily.     doxycycline (VIBRA-TABS) 100 MG tablet Take 1 tablet (100 mg total) by mouth 2 (two) times daily for 10 days. 20 tablet 0   LORazepam (ATIVAN) 1 MG tablet Take 1 tablet (1 mg total) by mouth at bedtime. 30 tablet 2   NOVOLOG FLEXPEN 100 UNIT/ML FlexPen Inject 0-10 Units into the skin daily as needed for high blood sugar (above 200). Sliding scale     omeprazole (PRILOSEC) 20 MG capsule Take 20 mg by mouth daily.     oxyCODONE-acetaminophen (PERCOCET) 7.5-325 MG tablet Take 1 tablet by mouth every 4 (four) hours as needed for severe pain. Do not take with cough medication or other pain medication 30 tablet 0   tamsulosin (FLOMAX) 0.4 MG CAPS capsule TAKE 1 CAPSULE(0.4 MG) BY MOUTH DAILY 90 capsule 0   triamcinolone ointment (KENALOG) 0.5 % Apply 1 Application topically 2 (two) times daily. 30 g 0   UNABLE TO FIND PLEASE CHECK FASTING BLOOD GLUCOSE EVERY MORNING 1 each 0   aspirin EC 81 MG tablet Take 1 tablet (81 mg total) by mouth daily. Swallow whole. 30 tablet 12   atorvastatin (LIPITOR) 40 MG tablet Take 1 tablet (40 mg total) by mouth daily. 60 tablet 2   dexamethasone (DECADRON) 4 MG tablet Take 1 tab every 12 hours the day before pemetrexed chemo, then take 2 tabs once a day for 3 days starting the day after carboplatin. (Patient not taking: Reported on 02/17/2022) 30 tablet 1   furosemide (LASIX) 20 MG tablet Take 1 tablet (20 mg total) by mouth daily as needed (leg swelling or sob). (Patient not taking: Reported on 02/17/2022) 30 tablet 3   HYDROcodone bit-homatropine (HYCODAN) 5-1.5 MG/5ML syrup Take 5 mLs by mouth every 8 (eight) hours as needed for cough. (Patient not taking: Reported on 02/17/2022) 120 mL 0   predniSONE (DELTASONE) 20 MG tablet Take 3 tablets (60 mg total) by mouth daily with breakfast for 5 days, THEN 2 tablets (40 mg total) daily with breakfast for 5  days, THEN 1 tablet (20 mg total) daily with breakfast for 5 days. (Patient not taking: Reported on 02/21/2022) 30 tablet 0   No current facility-administered medications for this visit.   Facility-Administered Medications Ordered in Other Visits  Medication Dose Route Frequency Provider Last Rate Last Admin   heparin lock flush 100 UNIT/ML injection             VITAL SIGNS: There were no vitals taken for this visit. There were no vitals filed for this visit.   Estimated body mass index is 22.74 kg/m as calculated from the following:   Height as of 02/17/22: _0  (1.702 m).   Weight as of an earlier encounter on 02/21/22: 145 lb 3.2 oz (65.9 kg).  LABS: CBC:    Component Value Date/Time   WBC 8.4 02/17/2022 1449   HGB 8.5 (L) 02/17/2022 1449   HGB 13.4 07/17/2021 1345  HCT 25.4 (L) 02/17/2022 1449   PLT 109 (L) 02/17/2022 1449   PLT 373 07/17/2021 1345   MCV 101.2 (H) 02/17/2022 1449   NEUTROABS 6.6 02/17/2022 1449   LYMPHSABS 0.5 (L) 02/17/2022 1449   MONOABS 1.0 02/17/2022 1449   EOSABS 0.0 02/17/2022 1449   BASOSABS 0.0 02/17/2022 1449   Comprehensive Metabolic Panel:    Component Value Date/Time   NA 138 02/11/2022 1457   K 4.8 02/11/2022 1457   CL 102 02/11/2022 1457   CO2 24 02/11/2022 1457   BUN 15 02/11/2022 1457   CREATININE 1.07 02/11/2022 1457   GLUCOSE 225 (H) 02/11/2022 1457   CALCIUM 8.5 (L) 02/11/2022 1457   AST 73 (H) 02/11/2022 1457   AST 18 07/17/2021 1345   ALT 57 (H) 02/11/2022 1457   ALT 20 07/17/2021 1345   ALKPHOS 89 02/03/2022 0958   BILITOT 0.5 02/11/2022 1457   BILITOT 0.6 07/17/2021 1345   PROT 6.2 02/11/2022 1457   ALBUMIN 3.2 (L) 02/03/2022 0958    RADIOGRAPHIC STUDIES: DG Chest 2 View  Result Date: 02/15/2022 CLINICAL DATA:  Cough and congestion.  History of lung cancer. EXAM: CHEST - 2 VIEW COMPARISON:  11/18/2021 FINDINGS: Right jugular Port-A-Cath is stable with the tip near the superior cavoatrial junction. Stable patchy  densities in the mid and lower left lung. No new airspace disease or consolidation. Heart size is normal. Atherosclerotic calcifications at the aortic arch. No pleural effusions. Stable compression deformity along the superior endplate of L2. IMPRESSION: 1. No acute cardiopulmonary disease. 2. Stable patchy densities in the mid and lower left lung. Electronically Signed   By: Markus Daft M.D.   On: 02/15/2022 09:17   CT CHEST ABDOMEN PELVIS W CONTRAST  Result Date: 01/28/2022 CLINICAL DATA:  71 year old male with history of non-small cell lung cancer. Restaging examination. * Tracking Code: BO * EXAM: CT CHEST, ABDOMEN, AND PELVIS WITH CONTRAST TECHNIQUE: Multidetector CT imaging of the chest, abdomen and pelvis was performed following the standard protocol during bolus administration of intravenous contrast. RADIATION DOSE REDUCTION: This exam was performed according to the departmental dose-optimization program which includes automated exposure control, adjustment of the mA and/or kV according to patient size and/or use of iterative reconstruction technique. CONTRAST:  133m OMNIPAQUE IOHEXOL 300 MG/ML  SOLN COMPARISON:  CT of the abdomen and pelvis 11/18/2021. PET-CT 10/29/2021. Chest CT 06/13/2021. FINDINGS: CT CHEST FINDINGS Cardiovascular: Heart size is normal. There is no significant pericardial fluid, thickening or pericardial calcification. There is aortic atherosclerosis, as well as atherosclerosis of the great vessels of the mediastinum and the coronary arteries, including calcified atherosclerotic plaque in the left main, left anterior descending, left circumflex and right coronary arteries. Right internal jugular single-lumen Port-A-Cath with tip terminating at the superior cavoatrial junction Mediastinum/Nodes: No pathologically enlarged mediastinal or hilar lymph nodes. Prominent but nonenlarged left hilar lymph node measuring 7 mm (axial image 25 of series 2) noted. Esophagus is unremarkable in  appearance. No axillary lymphadenopathy. Lungs/Pleura: Treated mass in the left upper lobe currently measures 3.9 x 2.2 cm and has spiculated margins with spiculations extending to the pleura both laterally and posteriorly, as well as a small amount of surrounding ground-glass attenuation and regional architectural distortion. No other definite new suspicious appearing pulmonary nodules or masses are noted. No acute consolidative airspace disease. No pleural effusions. Musculoskeletal: Sclerotic lesion in the left lateral aspect of the superior endplate of TH47(axial image 54 of series 2) measuring 2.1 x 1.1 cm, likely a  healed metastasis. CT ABDOMEN PELVIS FINDINGS Hepatobiliary: Diffuse low attenuation throughout the hepatic parenchyma, indicative of a background of hepatic steatosis. No suspicious cystic or solid hepatic lesions. No intra or extrahepatic biliary ductal dilatation. Gallbladder is unremarkable in appearance. Pancreas: No pancreatic mass. No pancreatic ductal dilatation. No pancreatic or peripancreatic fluid collections or inflammatory changes. Spleen: Unremarkable. Adrenals/Urinary Tract: 1.8 cm low-attenuation lesion in the anterior aspect of the interpolar region of the left kidney (axial image 68 of series 2), compatible with a simple (Bosniak class 1) cyst. Other subcentimeter low-attenuation lesions in both kidneys, too small to definitively characterize, but statistically likely to represent cysts; no imaging follow-up for any of these lesions is recommended at this time. No aggressive appearing renal lesions. Bilateral adrenal glands are normal in appearance. No hydroureteronephrosis. Urinary bladder is normal in appearance. Stomach/Bowel: The appearance of the stomach is normal. There is no pathologic dilatation of small bowel or colon. Normal appendix. Vascular/Lymphatic: Aortic atherosclerosis, without evidence of aneurysm or dissection in the abdominal or pelvic vasculature. No  lymphadenopathy noted in the abdomen or pelvis. Reproductive: Prostate gland and seminal vesicles are unremarkable in appearance. Other: No significant volume of ascites.  No pneumoperitoneum. Musculoskeletal: Numerous predominantly lytic lesions are noted in the visualized skeleton, most evident in the superior endplate of L5 where there is a large lytic lesion measuring 2.4 cm in diameter. Small lytic lesion also noted in the superior endplate of L2 where there appears to be pathologic compression resulting in 25% loss of anterior vertebral body height. Mixed lucency and sclerosis noted in the bony pelvis, most evident in the right iliac wing. IMPRESSION: 1. Treated left upper lobe mass appears grossly stable to the prior examination when measured in a similar fashion on the prior study. No definite signs of extra skeletal metastatic disease noted in the chest, abdomen or pelvis. 2. Widespread skeletal metastases redemonstrated, as above. 3. Hepatic steatosis. 4. Aortic atherosclerosis, in addition to left main and three-vessel coronary artery disease. Assessment for potential risk factor modification, dietary therapy or pharmacologic therapy may be warranted, if clinically indicated. 5. Additional incidental findings, as above. Electronically Signed   By: Vinnie Langton M.D.   On: 01/28/2022 07:40    PERFORMANCE STATUS (ECOG) : 1 - Symptomatic but completely ambulatory  Review of Systems Unless otherwise noted, a complete review of systems is negative.  Vitals: BP 137/75, Pulse 81, temp 96.47F, O2 100%, Weight 145 lbs   Physical Exam General: NAD Cardiovascular: regular rate and rhythm Pulmonary: clear ant fields Abdomen: soft, nontender, + bowel sounds GU: no suprapubic tenderness Extremities: no edema, no joint deformities Skin: See below Neurological: Weakness but otherwise nonfocal      02/21/2022 (below 3 images)       Assessment and Plan- Patient is a 71 y.o. male     Encounter Diagnosis  Name Primary?   Leg swelling Yes   Happy to hear he is feeling better. Has improved with both steriods and ABX so hard to definitively tell if this is related to the [facility] or cellulitis. For now he will continue on his doxycycline, start his prednisone and continue the strong topical steroid prescribed by his dermatologist. Reviewed red flags. He has follow up on Tuesday +- treatment. Likely will need to have Keytruda held but possibly would be healthy enough for the ALIMTA  Patient expressed understanding and was in agreement with this plan. He also understands that He can call clinic at any time with any questions, concerns, or  complaints.   Thank you for allowing me to participate in the care of this very pleasant patient.   Time Total: 25  Visit consisted of counseling and education dealing with the complex and emotionally intense issues of symptom management in the setting of serious illness.Greater than 50%  of this time was spent counseling and coordinating care related to the above assessment and plan.  Signed by: Nelwyn Salisbury, PA-C

## 2022-02-25 ENCOUNTER — Inpatient Hospital Stay: Payer: PPO

## 2022-02-25 ENCOUNTER — Inpatient Hospital Stay (HOSPITAL_BASED_OUTPATIENT_CLINIC_OR_DEPARTMENT_OTHER): Payer: PPO | Admitting: Oncology

## 2022-02-25 ENCOUNTER — Encounter: Payer: Self-pay | Admitting: Oncology

## 2022-02-25 VITALS — BP 141/83 | HR 87 | Temp 96.0°F | Wt 145.3 lb

## 2022-02-25 DIAGNOSIS — D518 Other vitamin B12 deficiency anemias: Secondary | ICD-10-CM

## 2022-02-25 DIAGNOSIS — D701 Agranulocytosis secondary to cancer chemotherapy: Secondary | ICD-10-CM

## 2022-02-25 DIAGNOSIS — M7989 Other specified soft tissue disorders: Secondary | ICD-10-CM

## 2022-02-25 DIAGNOSIS — T451X5A Adverse effect of antineoplastic and immunosuppressive drugs, initial encounter: Secondary | ICD-10-CM | POA: Diagnosis not present

## 2022-02-25 DIAGNOSIS — Z5111 Encounter for antineoplastic chemotherapy: Secondary | ICD-10-CM

## 2022-02-25 DIAGNOSIS — C3412 Malignant neoplasm of upper lobe, left bronchus or lung: Secondary | ICD-10-CM

## 2022-02-25 DIAGNOSIS — D6481 Anemia due to antineoplastic chemotherapy: Secondary | ICD-10-CM | POA: Diagnosis not present

## 2022-02-25 DIAGNOSIS — C7951 Secondary malignant neoplasm of bone: Secondary | ICD-10-CM | POA: Diagnosis not present

## 2022-02-25 DIAGNOSIS — I619 Nontraumatic intracerebral hemorrhage, unspecified: Secondary | ICD-10-CM | POA: Diagnosis not present

## 2022-02-25 LAB — CBC WITH DIFFERENTIAL/PLATELET
Abs Immature Granulocytes: 1.56 10*3/uL — ABNORMAL HIGH (ref 0.00–0.07)
Basophils Absolute: 0.1 10*3/uL (ref 0.0–0.1)
Basophils Relative: 0 %
Eosinophils Absolute: 0 10*3/uL (ref 0.0–0.5)
Eosinophils Relative: 0 %
HCT: 26.8 % — ABNORMAL LOW (ref 39.0–52.0)
Hemoglobin: 9.2 g/dL — ABNORMAL LOW (ref 13.0–17.0)
Immature Granulocytes: 6 %
Lymphocytes Relative: 5 %
Lymphs Abs: 1.3 10*3/uL (ref 0.7–4.0)
MCH: 34.6 pg — ABNORMAL HIGH (ref 26.0–34.0)
MCHC: 34.3 g/dL (ref 30.0–36.0)
MCV: 100.8 fL — ABNORMAL HIGH (ref 80.0–100.0)
Monocytes Absolute: 1.5 10*3/uL — ABNORMAL HIGH (ref 0.1–1.0)
Monocytes Relative: 5 %
Neutro Abs: 23.7 10*3/uL — ABNORMAL HIGH (ref 1.7–7.7)
Neutrophils Relative %: 84 %
Platelets: 585 10*3/uL — ABNORMAL HIGH (ref 150–400)
RBC: 2.66 MIL/uL — ABNORMAL LOW (ref 4.22–5.81)
RDW: 19.4 % — ABNORMAL HIGH (ref 11.5–15.5)
WBC: 28.1 10*3/uL — ABNORMAL HIGH (ref 4.0–10.5)
nRBC: 0.1 % (ref 0.0–0.2)

## 2022-02-25 LAB — COMPREHENSIVE METABOLIC PANEL
ALT: 20 U/L (ref 0–44)
AST: 27 U/L (ref 15–41)
Albumin: 3.4 g/dL — ABNORMAL LOW (ref 3.5–5.0)
Alkaline Phosphatase: 116 U/L (ref 38–126)
Anion gap: 8 (ref 5–15)
BUN: 38 mg/dL — ABNORMAL HIGH (ref 8–23)
CO2: 25 mmol/L (ref 22–32)
Calcium: 9.2 mg/dL (ref 8.9–10.3)
Chloride: 96 mmol/L — ABNORMAL LOW (ref 98–111)
Creatinine, Ser: 1.1 mg/dL (ref 0.61–1.24)
GFR, Estimated: >60 mL/min (ref >60)
Glucose, Bld: 227 mg/dL — ABNORMAL HIGH (ref 70–99)
Potassium: 3.7 mmol/L (ref 3.5–5.1)
Sodium: 129 mmol/L — ABNORMAL LOW (ref 135–145)
Total Bilirubin: 0.4 mg/dL (ref 0.3–1.2)
Total Protein: 6.8 g/dL (ref 6.5–8.1)

## 2022-02-25 MED ORDER — SODIUM CHLORIDE 0.9 % IV SOLN
200.0000 mg | Freq: Once | INTRAVENOUS | Status: AC
  Administered 2022-02-25: 200 mg via INTRAVENOUS
  Filled 2022-02-25: qty 200

## 2022-02-25 MED ORDER — PROCHLORPERAZINE MALEATE 10 MG PO TABS
10.0000 mg | ORAL_TABLET | Freq: Once | ORAL | Status: AC
  Administered 2022-02-25: 10 mg via ORAL
  Filled 2022-02-25: qty 1

## 2022-02-25 MED ORDER — HEPARIN SOD (PORK) LOCK FLUSH 100 UNIT/ML IV SOLN
INTRAVENOUS | Status: AC
  Administered 2022-02-25: 500 [IU]
  Filled 2022-02-25: qty 5

## 2022-02-25 MED ORDER — SODIUM CHLORIDE 0.9 % IV SOLN
Freq: Once | INTRAVENOUS | Status: AC
  Filled 2022-02-25: qty 250

## 2022-02-25 MED ORDER — HEPARIN SOD (PORK) LOCK FLUSH 100 UNIT/ML IV SOLN
500.0000 [IU] | Freq: Once | INTRAVENOUS | Status: AC | PRN
  Filled 2022-02-25: qty 5

## 2022-02-25 MED ORDER — ZOLEDRONIC ACID 4 MG/100ML IV SOLN
4.0000 mg | Freq: Once | INTRAVENOUS | Status: AC
  Administered 2022-02-25: 4 mg via INTRAVENOUS
  Filled 2022-02-25: qty 100

## 2022-02-25 MED ORDER — CYANOCOBALAMIN 1000 MCG/ML IJ SOLN
1000.0000 ug | Freq: Once | INTRAMUSCULAR | Status: AC
  Administered 2022-02-25: 1000 ug via INTRAMUSCULAR
  Filled 2022-02-25: qty 1

## 2022-02-25 MED ORDER — SODIUM CHLORIDE 0.9 % IV SOLN
350.0000 mg/m2 | Freq: Once | INTRAVENOUS | Status: AC
  Administered 2022-02-25: 600 mg via INTRAVENOUS
  Filled 2022-02-25: qty 20

## 2022-02-25 NOTE — Assessment & Plan Note (Signed)
Patient will get prophylactic G-CSF Udenyca on day 3. Comment Claritin 10mg  daily x 4 days.

## 2022-02-25 NOTE — Assessment & Plan Note (Signed)
continue  B12 injection every 6 weeks.

## 2022-02-25 NOTE — Assessment & Plan Note (Signed)
Hemoglobin is  Decreased.due to chemotherapy.  Monitor closely

## 2022-02-25 NOTE — Assessment & Plan Note (Signed)
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  PET scan images were reviewed with patient.- good partial response.  Labs are reviewed and discussed with patient. Proceed with maintenance Alimta/Keytruda today. -dose reduce Alimta 350mg /m2 Repeat CT chest abdomen pelvis w contrast was reviewed with patient. Stable disease. No enteritis Proceed with Alimta/Keytruda today

## 2022-02-25 NOTE — Assessment & Plan Note (Signed)
Chemotherapy plan as listed above 

## 2022-02-25 NOTE — Assessment & Plan Note (Addendum)
Swelling has been proved after started on Lasix.  Continue Check 2D echo He has also been treated with empiric antibiotics and steroids for possible diagnosis of cellulitis/immunotherapy induced dermatitis. I think is less likely immunotherapy induced dermatitis.  More likely vein insufficiency dermatitis changes.  Currently on 60 mg of prednisone.  Glucoses trending higher.  I recommend patient to taper down to 20 mg daily for 3 days followed by 10 mg for 3 days and then stop.

## 2022-02-25 NOTE — Assessment & Plan Note (Signed)
Hold Eliquis.  Follow-up with Dr. Mickeal Skinner

## 2022-02-25 NOTE — Progress Notes (Signed)
Hematology/Oncology Progress note [phone] [phone]            Patient Care Team: Micheal Frizzle, MD as PCP - General (Family Medicine) Micheal Nab, RN as Oncology Nurse Navigator   ASSESSMENT & PLAN:   Cancer Staging  Primary non-small cell carcinoma of upper lobe of left lung [facility]) Staging form: Lung, AJCC 8th Edition - Clinical: Stage IV (cT2b, cN1, cM1) - Signed by Micheal Server, MD on 08/06/2021   Primary non-small cell carcinoma of upper lobe of left lung ([place]) Stage IV lung adenocarcinoma with brain and bone metastasis.  PET scan images were reviewed with patient.- good partial response.  Labs are reviewed and discussed with patient. Proceed with maintenance Alimta/Keytruda today. -dose reduce Alimta 346m/m2 Repeat CT chest abdomen pelvis w contrast was reviewed with patient. Stable disease. No enteritis Proceed with Alimta/Keytruda today  Anemia due to antineoplastic chemotherapy Hemoglobin is  Decreased.due to chemotherapy.  Monitor closely  B12 deficiency anemia continue  B12 injection every 6 weeks.   Chemotherapy induced neutropenia ([facility] Patient will get prophylactic G-CSF Udenyca on day 3. Comment Claritin 132mdaily x 4 days.   Encounter for antineoplastic chemotherapy Chemotherapy plan as listed above  Intraparenchymal hemorrhage of brain (HCC) Hold Eliquis.  Follow-up with Dr. VaMickeal SkinnerMetastasis to bone ([facility] AuthorityRecommend Zometa every 4- 6 weeks. Recommend calcium supplementation.   Swelling of lower extremity Swelling has been proved after started on Lasix.  Continue Check 2D echo He has also been treated with empiric antibiotics and steroids for possible diagnosis of cellulitis/immunotherapy induced dermatitis. I think is less likely immunotherapy induced dermatitis.  More likely vein insufficiency dermatitis changes.  Currently on 60 mg of prednisone.  Glucoses trending higher.  I recommend patient to taper down to 20  mg daily for 3 days followed by 10 mg for 3 days and then stop.    Orders Placed This Encounter  Procedures   ECHOCARDIOGRAM COMPLETE    Standing Status:   Future    Standing Expiration Date:   02/26/2023    Order Specific Question:   Where should this test be performed    Answer:   [facility]    Order Specific Question:   Perflutren DEFINITY (image enhancing agent) should be administered unless hypersensitivity or allergy exist    Answer:   Administer Perflutren    Order Specific Question:   Is a special reader required? (athlete or structural heart)    Answer:   No    Order Specific Question:   Reason for exam-Echo    Answer:   Other-Full Diagnosis List    Order Specific Question:   Full ICD-10/Reason for Exam    Answer:   Leg swelling [[id number]]  Order Specific Question:   Other Comments    Answer:   Bilat leg swelling   Follow up Per LOS  All questions were answered. The patient knows to call the clinic with any problems, questions or concerns.  Micheal ServerMD, [name] CoB[facility] Hemat[facility] Oncology 02/25/2022      CHIEF COMPLAINTS/REASON FOR VISIT:  non-small cell lung cancer  HISTORY OF PRESENTING ILLNESS:   Micheal Donovan a  [age].o.  male presents for follow up of Non-small cell lung cancer.  Oncology History Overview Note  Diagnosis: Stage IIB T2b N1 M0 adenocarcinoma of the LUL, poorly differentiated     Primary non-small cell carcinoma of upper lobe of left lung ([facility] 06/13/2021 Initial Diagnosis  Primary non-small cell carcinoma of upper lobe of left lung [facility]) -06/20/2021 - 06/27/2021, patient presented to [facility] due to progressive headache/dizziness/gait changes. CT head showed acute to subacute intraparenchymal hemorrhage involving the left cerebellum.  Surrounding low-density vasogenic edema.  Patient was transferred to [facility]. This was further evaluated by CT angiogram of the neck which showed bulky calcified plaques/stenosis of  carotid artery, Followed by MRI brain. 06/12/2021, MRI of the brain showed no significant interval change in size of the left cerebellar intraparenchymal hematoma with unchanged regional mass effect and partial effacement of fourth ventricle but no upstream hydrocephalus. There is no discernible enhancement to suggest underlying mass lesion, though acute blood products could mask enhancement.   06/12/2021 a chest x-ray showed a 4.4 cm left middle lobe. 06/13/2021, CT chest with contrast showed a 4.3 x 3.6 x 3.3 cm lobular spiculated mass in the posterior left upper lobe with T3 to the lateral pleura and major fissure.  Metastatic left hilar lymphadenopathy.  Peripheral micronodularity posterior right costophrenic sulcus.  Aortic atherosclerosis. 06/14/2021, CT abdomen pelvis showed right lower lobe pulmonary artery embolus.  No evidence of right heart strain.  No acute intra-abdominal or pelvic pathology.  Aortic atherosclerosis.  06/13/2021, patient underwent bronchoscopy with EBUS by Dr. Tamala Julian.  Biopsy from the fine-needle aspiration of station 11 mL showed malignant cells, consistent with poorly differentiated non-small cell carcinoma, consistent with adenocarcinoma.  Malignant cells are TTF-1 positive and negative for p40.  Negative for neuroendocrine markers.  Blood test and tissue sample were tested for Gardant 360  -PD-L1 TPS 97%, no actionable mutation on the blood testing. 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   07/17/2021 Cancer Staging   Staging form: Lung, AJCC 8th Edition - Clinical: Stage IV (cT2b, cN1, cM1) - Signed by Micheal Server, MD on 08/06/2021   08/06/2021 Imaging   I saw patient's PET scan after his visit with me on 08/06/21. PET scan was ordered by his previous oncologist group and did not come to my in basket.. Patient received cycle 1 carboplatin Taxol today.  He has started on radiation.  5/26/3 PET scan showed 3.8 cm hypermetabolic left upper lobe mass with  hypermetabolic left hilar and infrahilar adenopathy.  There is approximately 8 scattered metastatic lesions in the skeleton. Mixed density photopenic lesion in the left cerebellum. characterized as hemorrhage on recent prior imaging workups.    08/06/2021 - 08/06/2021 Chemotherapy   Patient is on Treatment Plan : LUNG Carboplatin / Paclitaxel + XRT q7d     08/16/2021 -  Chemotherapy   Switched to systemic chemotherapy with  Carboplatin (4.5) + Pemetrexed (400) + Pembrolizumab (200) D1 q21d Induction x 4 cycles      08/16/2021 -  Chemotherapy   Patient is on Treatment Plan : LUNG Carboplatin (5) + Pemetrexed + Pembrolizumab (200) D1 q21d Induction x 4 cycles / Maintenance Pemetrexed + Pembrolizumab (200) D1 q21d     08/22/2021 Imaging   MRI brain w wo contrast  Decrease in size of left cerebellar hematoma with resolution of edema. No evidence of underlying lesion. Smaller, more recent hemorrhage in the left cerebellar vermis with minimal edema. There is minimal enhancement without definite evidence of underlying lesion.  New punctate focus of chronic blood products and enhancement in the left frontal lobe. Additional new foci of chronic blood products in the posterior right putamen, right parietal subcortical white matter, and posterior right cerebellum. Unclear at this time if these represent foci of bland hemorrhage or  early metastases.   Increase in size of right parietal osseous metastasis with minor extraosseous extension.    10/22/2021 - 10/22/2021 Chemotherapy   Patient is on Treatment Plan : LUNG Carboplatin (5) + Pemetrexed (500) + Pembrolizumab (200) D1 q21d Induction x 4 cycles / Maintenance Pemetrexed (500) + Pembrolizumab (200) D1 q21d      Imaging   PET scan showed 1. Interval response to therapy as evidenced by a small residual left upper lobe nodule with decreased hypermetabolism, no residual hypermetabolic adenopathy and decreased hypermetabolism associatedwith osseous  metastases. 2. 6 mm posterior left upper lobe nodule, likely stable. Recommend attention on follow-up. 3. Aortic atherosclerosis (ICD10-I70.0). Coronary artery calcification.   12/17/2021 Imaging   MRI lumbar spine w wo contrast  1. No evidence of regional metastatic disease. 2. Late subacute superior endplate fracture at L2 and large superior endplate Schmorl's node at L5, unchanged since the PET scan of 10/29/2021. 3. L3-4: Shallow disc protrusion. Facet and ligamentous hypertrophy. Stenosis of both lateral recesses. Findings slightly worsened since 2022. 4. L4-5: Shallow disc protrusion. Facet and ligamentous hypertrophy. Small synovial cyst arising from the facet joint on the right. Stenosis of the lateral recesses right worse than left. Foraminal narrowing right worse than left. Findings have worsened since 2022.  5. L5-S1: Endplate osteophytes and shallow protrusion of the disc. Facet and ligamentous hypertrophy. Stenosis of the subarticular lateral recesses and neural foramina, right worse than left. Similar appearance to the study of 2022. 6. Continued evidence of enteritis of at least 1 loop of small bowel. Distended bladder present   12/18/2021 Imaging   Brian MRI w wo  1. Interval decrease in size of the left cerebellar and vermian hematomas. No definite evidence of underlying metastatic lesion. 2. There is are two possible contrast enhancing lesions in the posterior left frontal lobe and left occipital lobe, which do not have intrinsic T1 signal abnormality, but have a somewhat linear appearance and may be vascular in nature. Recommend attention on follow up. 3. Multifocal sites of susceptibility artifact are redemonstrated with interval development of a few new foci, as described above. All of these sites demonstrate intrinsic T1 hyperintense signal abnormality and susceptibility artifact and are compatible with sites of microhemorrhages. Recommend continued attention on  follow up. 4. Interval decrease in size of right parietal calvarial metastatic lesion. No new contrast enhancing lesions visualized. 5. Diffusely heterogeneous marrow signal throughout the cervical spine, which is nonspecific but can be seen in the setting of anemia, smoking, obesity, or a marrow replacement process.     12/18/2021 Imaging   MRI lumbar spine w wo contrast  1. No evidence of regional metastatic disease. 2. Late subacute superior endplate fracture at L2 and large superior endplate Schmorl's node at L5, unchanged since the PET scan of 10/29/2021 3. L3-4: Shallow disc protrusion. Facet and ligamentous hypertrophy.Stenosis of both lateral recesses. Findings slightly worsened since 2022. 4. L4-5: Shallow disc protrusion. Facet and ligamentous hypertrophy. Small synovial cyst arising from the facet joint on the right. Stenosis of the lateral recesses right worse than left. Foraminal narrowing right worse than left. Findings have worsened since 2022. 5. L5-S1: Endplate osteophytes and shallow protrusion of the disc. Facet and ligamentous hypertrophy. Stenosis of the subarticular lateral recesses and neural foramina, right worse than left. Similar appearance to the study of 2022. 6. Continued evidence of enteritis of at least 1 loop of small bowel. Distended bladder present.       01/28/2022 Imaging   CT chest abdomen  pelvis w contrast 1. Treated left upper lobe mass appears grossly stable to the prior examination when measured in a similar fashion on the prior study. No definite signs of extra skeletal metastatic disease noted in the chest, abdomen or pelvis. 2. Widespread skeletal metastases redemonstrated, as above. 3. Hepatic steatosis. 4. Aortic atherosclerosis, in addition to left main and three-vessel coronary artery disease. Assessment for potential risk factor modification, dietary therapy or pharmacologic therapy may be warranted, if clinically indicated. 5. Additional  incidental findings,    # Patient has a history of melanoma on his neck, treated in 2009. # History of hemorrhagic infarct left cerebellum  INTERVAL HISTORY ABISAI DEER is a 71 y.o. male who has above history reviewed by me today presents for follow up visit for management of  Stage IV lung adenocarcianoma # Pulmonary embolism, SOB is stable, not worse. Off Eliquis due to MRI findings.  + Insomnia and nocturia. On Ativan QHS. \ + Lower extremity swelling, started on Lasix, swelling has improved.  Lower extremity " rash" Patient was seen by symptom management clinic nurse practitioner and was treated with empiric steroids and antibiotics.   Review of Systems  Constitutional:  Positive for fatigue. Negative for appetite change, chills, fever and unexpected weight change.  HENT:   Negative for hearing loss and voice change.   Eyes:  Negative for eye problems and icterus.  Respiratory:  Positive for shortness of breath. Negative for chest tightness and cough.   Cardiovascular:  Positive for leg swelling. Negative for chest pain.  Gastrointestinal:  Negative for abdominal distention, abdominal pain and blood in stool.  Endocrine: Negative for hot flashes.  Genitourinary:  Negative for difficulty urinating, dysuria and frequency.   Musculoskeletal:  Negative for arthralgias.  Skin:  Negative for itching and rash.  Neurological:  Negative for extremity weakness, headaches, light-headedness and numbness.  Hematological:  Negative for adenopathy. Does not bruise/bleed easily.  Psychiatric/Behavioral:  Positive for sleep disturbance. Negative for confusion.     MEDICAL HISTORY:  Past Medical History:  Diagnosis Date   Allergy    BPH (benign prostatic hypertrophy)    Cancer (HCC)    Melanoma on Neck    2008   Carotid artery occlusion    Diabetes mellitus    type 2   Micheal (erectile dysfunction)    GERD (gastroesophageal reflux disease)    Hemorrhagic stroke ([place]) 06/2021    Hyperlipidemia    Hypertension    Lung cancer ([place])    Retinopathy due to secondary DM ([place])     SURGICAL HISTORY: Past Surgical History:  Procedure Laterality Date   BRONCHIAL NEEDLE ASPIRATION BIOPSY  06/17/2021   Procedure: BRONCHIAL NEEDLE ASPIRATION BIOPSIES;  Surgeon: Candee Furbish, MD;  Location: [facility] ENDOSCOPY;  Service: Pulmonary;;   IR IMAGING GUIDED PORT INSERTION  08/05/2021   MELANOMA EXCISION  2008   Left side of neck   RADIOLOGY WITH ANESTHESIA N/A 04/05/2020   Procedure: MRI SPINE WITOUT CONTRAST;  Surgeon: Radiologist, Medication, MD;  Location: [place];  Service: Radiology;  Laterality: N/A;   RADIOLOGY WITH ANESTHESIA N/A 08/22/2021   Procedure: MRI BRAIN WITH AND WITHOUT CONTRAST  WITH ANESTHESIA;  Surgeon: Radiologist, Medication, MD;  Location: [place];  Service: Radiology;  Laterality: N/A;   RADIOLOGY WITH ANESTHESIA N/A 12/17/2021   Procedure: MRI BRAIN WITH AND WITHOUT CONTRAST WITH ANESTHESIA; MRI LUMBER WITH AND WITHOUT CONTRAST;  Surgeon: Radiologist, Medication, MD;  Location: [place];  Service: Radiology;  Laterality: N/A;  TONSILLECTOMY     VIDEO BRONCHOSCOPY WITH ENDOBRONCHIAL ULTRASOUND N/A 06/17/2021   Procedure: VIDEO BRONCHOSCOPY WITH ENDOBRONCHIAL ULTRASOUND;  Surgeon: Candee Furbish, MD;  Location: [facility] ENDOSCOPY;  Service: Pulmonary;  Laterality: N/A;    SOCIAL HISTORY: Social History   Socioeconomic History   Marital status: Married    Spouse name: Not on file   Number of children: Not on file   Years of education: Not on file   Highest education level: Not on file  Occupational History   Not on file  Tobacco Use   Smoking status: Former    Types: Cigarettes    Quit date: 07/21/1990    Years since quitting: 31.6   Smokeless tobacco: Never  Vaping Use   Vaping Use: Never used  Substance and Sexual Activity   Alcohol use: No   Drug use: No   Sexual activity: Not on file  Other Topics Concern   Not on file  Social History Narrative   Not on  file   Social Determinants of Health   Financial Resource Strain: Low Risk  (11/21/2021)   Overall Financial Resource Strain (CARDIA)    Difficulty of Paying Living Expenses: Not hard at all  Food Insecurity: No Food Insecurity (11/19/2021)   Hunger Vital Sign    Worried About Running Out of Food in the Last Year: Never true    [place] in the Last Year: Never true  Transportation Needs: No Transportation Needs (11/21/2021)   PRAPARE - Hydrologist (Medical): No    Lack of Transportation (Non-Medical): No  Physical Activity: Inactive (07/31/2021)   Exercise Vital Sign    Days of Exercise per Week: 0 days    Minutes of Exercise per Session: 0 min  Stress: Unknown (07/31/2021)   [place]    Feeling of Stress : Patient refused  Social Connections: Unknown (07/31/2021)   Social Connection and Isolation Panel [NHANES]    Frequency of Communication with Friends and Family: Three times a week    Frequency of Social Gatherings with Friends and Family: Three times a week    Attends Religious Services: Patient refused    Active Member of Clubs or Organizations: Patient refused    Attends Archivist Meetings: Patient refused    Marital Status: Married  Human resources officer Violence: Not At Risk (07/31/2021)   Humiliation, Afraid, Rape, and Kick questionnaire    Fear of Current or Ex-Partner: No    Emotionally Abused: No    Physically Abused: No    Sexually Abused: No    FAMILY HISTORY: Family History  Problem Relation Age of Onset   COPD Mother    Heart disease Father    Heart disease Brother        MI at age 69   Stroke Neg Hx     ALLERGIES:  is allergic to niaspan [niacin].  MEDICATIONS:  Current Outpatient Medications  Medication Sig Dispense Refill   aspirin EC 81 MG tablet Take 1 tablet (81 mg total) by mouth daily. Swallow whole. 30 tablet 12   atorvastatin (LIPITOR)  40 MG tablet Take 1 tablet (40 mg total) by mouth daily. 60 tablet 2   Ca Phosphate-Cholecalciferol (CALCIUM WITH D3 PO) Take 1 capsule by mouth daily.     dexamethasone (DECADRON) 4 MG tablet Take 1 tab every 12 hours the day before pemetrexed chemo, then take 2 tabs once a day for 3  days starting the day after carboplatin. 30 tablet 1   doxycycline (VIBRA-TABS) 100 MG tablet Take 1 tablet (100 mg total) by mouth 2 (two) times daily for 10 days. 20 tablet 0   furosemide (LASIX) 20 MG tablet Take 1 tablet (20 mg total) by mouth daily as needed (leg swelling or sob). 30 tablet 3   LORazepam (ATIVAN) 1 MG tablet Take 1 tablet (1 mg total) by mouth at bedtime. 30 tablet 2   NOVOLOG FLEXPEN 100 UNIT/ML FlexPen Inject 0-10 Units into the skin daily as needed for high blood sugar (above 200). Sliding scale     omeprazole (PRILOSEC) 20 MG capsule Take 20 mg by mouth daily.     predniSONE (DELTASONE) 20 MG tablet Take 3 tablets (60 mg total) by mouth daily with breakfast for 5 days, THEN 2 tablets (40 mg total) daily with breakfast for 5 days, THEN 1 tablet (20 mg total) daily with breakfast for 5 days. 30 tablet 0   tamsulosin (FLOMAX) 0.4 MG CAPS capsule TAKE 1 CAPSULE(0.4 MG) BY MOUTH DAILY 90 capsule 0   triamcinolone ointment (KENALOG) 0.5 % Apply 1 Application topically 2 (two) times daily. 30 g 0   HYDROcodone bit-homatropine (HYCODAN) 5-1.5 MG/5ML syrup Take 5 mLs by mouth every 8 (eight) hours as needed for cough. (Patient not taking: Reported on 02/25/2022) 120 mL 0   oxyCODONE-acetaminophen (PERCOCET) 7.5-325 MG tablet Take 1 tablet by mouth every 4 (four) hours as needed for severe pain. Do not take with cough medication or other pain medication 30 tablet 0   UNABLE TO FIND PLEASE CHECK FASTING BLOOD GLUCOSE EVERY MORNING 1 each 0   No current facility-administered medications for this visit.   Facility-Administered Medications Ordered in Other Visits  Medication Dose Route Frequency Provider  Last Rate Last Admin   heparin lock flush 100 UNIT/ML injection              PHYSICAL EXAMINATION:  Vitals:   02/25/22 0836  BP: (!) 141/83  Pulse: 87  Temp: (!) 96 F (35.6 C)  SpO2: 100%   Filed Weights   02/25/22 0836  Weight: 145 lb 4.8 oz (65.9 kg)    Physical Exam Constitutional:      General: He is not in acute distress. HENT:     Head: Normocephalic and atraumatic.  Eyes:     General: No scleral icterus. Cardiovascular:     Rate and Rhythm: Normal rate and regular rhythm.     Heart sounds: Normal heart sounds.  Pulmonary:     Effort: Pulmonary effort is normal. No respiratory distress.     Breath sounds: No wheezing.     Comments: Decreased breath sound bilaterally. Abdominal:     General: Bowel sounds are normal. There is no distension.     Palpations: Abdomen is soft.  Musculoskeletal:        General: No deformity. Normal range of motion.     Cervical back: Normal range of motion and neck supple.     Right lower leg: Edema present.     Left lower leg: Edema present.  Skin:    General: Skin is warm and dry.     Findings: No erythema or rash.  Neurological:     Mental Status: He is alert and oriented to person, place, and time. Mental status is at baseline.     Cranial Nerves: No cranial nerve deficit.     Coordination: Coordination normal.  Psychiatric:  Mood and Affect: Mood normal.     LABORATORY DATA:  I have reviewed the data as listed    Latest Ref Rng & Units 02/25/2022    8:00 AM 02/17/2022    2:49 PM 02/13/2022    2:11 PM  CBC  WBC 4.0 - 10.5 K/uL 28.1  8.4  1.1   Hemoglobin 13.0 - 17.0 g/dL 9.2  8.5  8.2   Hematocrit 39.0 - 52.0 % 26.8  25.4  24.5   Platelets 150 - 400 K/uL 585  109  62       Latest Ref Rng & Units 02/25/2022    8:00 AM 02/11/2022    2:57 PM 02/03/2022    9:58 AM  CMP  Glucose 70 - 99 mg/dL 227  225  250   BUN 8 - 23 mg/dL 38  15  20   Creatinine 0.61 - 1.24 mg/dL 1.10  1.07  1.15   Sodium 135 - 145  mmol/L 129  138  132   Potassium 3.5 - 5.1 mmol/L 3.7  4.8  4.2   Chloride 98 - 111 mmol/L 96  102  98   CO2 22 - 32 mmol/L _0 Calcium 8.9 - 10.3 mg/dL 9.2  8.5  8.8   Total Protein 6.5 - 8.1 g/dL 6.8  6.2  6.5   Total Bilirubin 0.3 - 1.2 mg/dL 0.4  0.5  0.4   Alkaline Phos 38 - 126 U/L 116   89   AST 15 - 41 U/L 27  73  33   ALT 0 - 44 U/L 20  57  29      Iron/TIBC/Ferritin/ %Sat    Component Value Date/Time   IRON 60 02/03/2022 0958   TIBC 391 02/03/2022 0958   FERRITIN 704 (H) 02/03/2022 0958   IRONPCTSAT 15 (L) 02/03/2022 4403      RADIOGRAPHIC STUDIES: I have personally reviewed the radiological images as listed and agreed with the findings in the report. DG Chest 2 View  Result Date: 02/15/2022 CLINICAL DATA:  Cough and congestion.  History of lung cancer. EXAM: CHEST - 2 VIEW COMPARISON:  11/18/2021 FINDINGS: Right jugular Port-A-Cath is stable with the tip near the superior cavoatrial junction. Stable patchy densities in the mid and lower left lung. No new airspace disease or consolidation. Heart size is normal. Atherosclerotic calcifications at the aortic arch. No pleural effusions. Stable compression deformity along the superior endplate of L2. IMPRESSION: 1. No acute cardiopulmonary disease. 2. Stable patchy densities in the mid and lower left lung. Electronically Signed   By: Markus Daft M.D.   On: 02/15/2022 09:17   CT CHEST ABDOMEN PELVIS W CONTRAST  Result Date: 01/28/2022 CLINICAL DATA:  71 year old male with history of non-small cell lung cancer. Restaging examination. * Tracking Code: BO * EXAM: CT CHEST, ABDOMEN, AND PELVIS WITH CONTRAST TECHNIQUE: Multidetector CT imaging of the chest, abdomen and pelvis was performed following the standard protocol during bolus administration of intravenous contrast. RADIATION DOSE REDUCTION: This exam was performed according to the departmental dose-optimization program which includes automated exposure control,  adjustment of the mA and/or kV according to patient size and/or use of iterative reconstruction technique. CONTRAST:  138m OMNIPAQUE IOHEXOL 300 MG/ML  SOLN COMPARISON:  CT of the abdomen and pelvis 11/18/2021. PET-CT 10/29/2021. Chest CT 06/13/2021. FINDINGS: CT CHEST FINDINGS Cardiovascular: Heart size is normal. There is no significant pericardial fluid, thickening or pericardial calcification. There is aortic atherosclerosis, as well  as atherosclerosis of the great vessels of the mediastinum and the coronary arteries, including calcified atherosclerotic plaque in the left main, left anterior descending, left circumflex and right coronary arteries. Right internal jugular single-lumen Port-A-Cath with tip terminating at the superior cavoatrial junction Mediastinum/Nodes: No pathologically enlarged mediastinal or hilar lymph nodes. Prominent but nonenlarged left hilar lymph node measuring 7 mm (axial image 25 of series 2) noted. Esophagus is unremarkable in appearance. No axillary lymphadenopathy. Lungs/Pleura: Treated mass in the left upper lobe currently measures 3.9 x 2.2 cm and has spiculated margins with spiculations extending to the pleura both laterally and posteriorly, as well as a small amount of surrounding ground-glass attenuation and regional architectural distortion. No other definite new suspicious appearing pulmonary nodules or masses are noted. No acute consolidative airspace disease. No pleural effusions. Musculoskeletal: Sclerotic lesion in the left lateral aspect of the superior endplate of W09 (axial image 54 of series 2) measuring 2.1 x 1.1 cm, likely a healed metastasis. CT ABDOMEN PELVIS FINDINGS Hepatobiliary: Diffuse low attenuation throughout the hepatic parenchyma, indicative of a background of hepatic steatosis. No suspicious cystic or solid hepatic lesions. No intra or extrahepatic biliary ductal dilatation. Gallbladder is unremarkable in appearance. Pancreas: No pancreatic mass. No  pancreatic ductal dilatation. No pancreatic or peripancreatic fluid collections or inflammatory changes. Spleen: Unremarkable. Adrenals/Urinary Tract: 1.8 cm low-attenuation lesion in the anterior aspect of the interpolar region of the left kidney (axial image 68 of series 2), compatible with a simple (Bosniak class 1) cyst. Other subcentimeter low-attenuation lesions in both kidneys, too small to definitively characterize, but statistically likely to represent cysts; no imaging follow-up for any of these lesions is recommended at this time. No aggressive appearing renal lesions. Bilateral adrenal glands are normal in appearance. No hydroureteronephrosis. Urinary bladder is normal in appearance. Stomach/Bowel: The appearance of the stomach is normal. There is no pathologic dilatation of small bowel or colon. Normal appendix. Vascular/Lymphatic: Aortic atherosclerosis, without evidence of aneurysm or dissection in the abdominal or pelvic vasculature. No lymphadenopathy noted in the abdomen or pelvis. Reproductive: Prostate gland and seminal vesicles are unremarkable in appearance. Other: No significant volume of ascites.  No pneumoperitoneum. Musculoskeletal: Numerous predominantly lytic lesions are noted in the visualized skeleton, most evident in the superior endplate of L5 where there is a large lytic lesion measuring 2.4 cm in diameter. Small lytic lesion also noted in the superior endplate of L2 where there appears to be pathologic compression resulting in 25% loss of anterior vertebral body height. Mixed lucency and sclerosis noted in the bony pelvis, most evident in the right iliac wing. IMPRESSION: 1. Treated left upper lobe mass appears grossly stable to the prior examination when measured in a similar fashion on the prior study. No definite signs of extra skeletal metastatic disease noted in the chest, abdomen or pelvis. 2. Widespread skeletal metastases redemonstrated, as above. 3. Hepatic steatosis. 4.  Aortic atherosclerosis, in addition to left main and three-vessel coronary artery disease. Assessment for potential risk factor modification, dietary therapy or pharmacologic therapy may be warranted, if clinically indicated. 5. Additional incidental findings, as above. Electronically Signed   By: Vinnie Langton M.D.   On: 01/28/2022 07:40

## 2022-02-25 NOTE — Assessment & Plan Note (Signed)
Recommend Zometa every 4- 6 weeks. Recommend calcium supplementation.

## 2022-02-27 ENCOUNTER — Inpatient Hospital Stay: Payer: PPO

## 2022-02-27 DIAGNOSIS — Z5111 Encounter for antineoplastic chemotherapy: Secondary | ICD-10-CM | POA: Diagnosis not present

## 2022-02-27 DIAGNOSIS — C3412 Malignant neoplasm of upper lobe, left bronchus or lung: Secondary | ICD-10-CM

## 2022-02-27 MED ORDER — FILGRASTIM-SNDZ 300 MCG/0.5ML IJ SOSY: 300.0000 ug | PREFILLED_SYRINGE | Freq: Once | INTRAMUSCULAR | Status: DC

## 2022-02-27 MED ORDER — PEGFILGRASTIM-CBQV 6 MG/0.6ML ~~LOC~~ SOSY
6.0000 mg | PREFILLED_SYRINGE | Freq: Once | SUBCUTANEOUS | Status: AC
  Administered 2022-02-27: 6 mg via SUBCUTANEOUS
  Filled 2022-02-27: qty 0.6

## 2022-02-28 ENCOUNTER — Encounter: Payer: Self-pay | Admitting: *Deleted

## 2022-03-04 ENCOUNTER — Ambulatory Visit: Payer: PPO

## 2022-03-06 ENCOUNTER — Ambulatory Visit: Payer: PPO | Admitting: Family Medicine

## 2022-03-07 ENCOUNTER — Ambulatory Visit (INDEPENDENT_AMBULATORY_CARE_PROVIDER_SITE_OTHER): Payer: PPO | Admitting: Family Medicine

## 2022-03-07 ENCOUNTER — Encounter: Payer: Self-pay | Admitting: Family Medicine

## 2022-03-07 VITALS — BP 124/72 | HR 98 | Ht 67.0 in | Wt 141.0 lb

## 2022-03-07 DIAGNOSIS — S32029A Unspecified fracture of second lumbar vertebra, initial encounter for closed fracture: Secondary | ICD-10-CM

## 2022-03-07 DIAGNOSIS — J029 Acute pharyngitis, unspecified: Secondary | ICD-10-CM | POA: Diagnosis not present

## 2022-03-07 DIAGNOSIS — I5021 Acute systolic (congestive) heart failure: Secondary | ICD-10-CM

## 2022-03-07 DIAGNOSIS — S32029D Unspecified fracture of second lumbar vertebra, subsequent encounter for fracture with routine healing: Secondary | ICD-10-CM

## 2022-03-07 DIAGNOSIS — M47816 Spondylosis without myelopathy or radiculopathy, lumbar region: Secondary | ICD-10-CM | POA: Diagnosis not present

## 2022-03-07 MED ORDER — FENTANYL 25 MCG/HR TD PT72: 1.0000 | MEDICATED_PATCH | TRANSDERMAL | 0 refills | Status: DC

## 2022-03-07 MED ORDER — VALSARTAN 80 MG PO TABS: 80.0000 mg | ORAL_TABLET | Freq: Every day | ORAL | 3 refills | Status: DC

## 2022-03-07 NOTE — Progress Notes (Signed)
Subjective:    Patient ID: Micheal Donovan, male    DOB: 05-16-1950, 72 y.o.   MRN: [id number]  Sore Throat   Back Pain  02/14/22 Patient was recently seen for petechia and purpura on his legs and was found to have thrombocytopenia and bone marrow suppression related to his chemotherapy agent.  His oncologist started him on treatment for this and is monitoring his thrombocytopenia.  However the patient is here today because he has been developing swelling in his legs and there was concern about possible congestive heart failure.  Of note, the patient had an echocardiogram in April of this year which did show a mildly suppressed ejection fraction of 45 to 50% as well as some diastolic dysfunction.  IMPRESSIONS     1. Abnormal septal motion . Left ventricular ejection fraction, by  estimation, is 45 to 50%. The left ventricle has mildly decreased  function. The left ventricle demonstrates global hypokinesis. There is  mild left ventricular hypertrophy. Left  ventricular diastolic parameters are consistent with Grade I diastolic  dysfunction (impaired relaxation).   2. Right ventricular systolic function is normal. The right ventricular  size is normal.   3. The mitral valve is normal in structure. No evidence of mitral valve  regurgitation. No evidence of mitral stenosis.   4. The aortic valve is tricuspid. There is mild calcification of the  aortic valve. There is mild thickening of the aortic valve. Aortic valve  regurgitation is not visualized. Aortic valve sclerosis is present, with  no evidence of aortic valve stenosis.   5. The inferior vena cava is normal in size with greater than 50%  respiratory variability, suggesting right atrial pressure of 3 mmHg.   Patient denies any chest pain.  He denies any shortness of breath or dyspnea on exertion although he is sedentary.  He does have +1 pitting edema in both ankles up to his mid shin and this is likely leading to the petechia due to  venous pressure and capillary rupture.  Otherwise he is asymptomatic.  At that time, my plan was: discussed the echocardiogram from April.  At that time the patient had suffered a hemorrhagic stroke, had been diagnosed with lung cancer, and we were not certain what his long-term prognosis would be.  Therefore aggressive treatment was deferred.  Now the patient is doing better.  He is developing peripheral edema and this is likely related to his reduced ejection fraction.  I gave the patient Lasix today 20 mg daily as needed for swelling.  I recommended that he take his weight every day and if he gains more than 2 pounds in 24 hours he use Lasix.  We also discussed treatment for congestive heart failure including beta-blockers, ARB, Entresto, spironolactone, and SGLT2 inhibitors.  I suggested that we start the patient on Entresto.  However the patient would like to discuss the situation with his oncologist and see a cardiologist for a second opinion.  Therefore I will arrange cardiology consultation.  03/07/22 Patient is in tremendous pain today.  He has already had 1 epidural steroid injection about 2 weeks ago and he saw no benefit from that.  He is taking oxycodone 3 times a day due to the severe pain in his back.  He grimaces every time he moves today during the office visit.  There is a tear forming in the corner of his eye although he is trying to be very stoic.  He is obviously in significant pain despite taking oxycodone.  He has been sick recently so he missed his appointment with a cardiologist.  He stopped furosemide because the swelling in his legs resolved.  He does have peeling skin which I believe is likely related to the recent edema that he had as well as chronic venous stasis dermatitis.  He denies any pain or burning in his legs.  He does have aphthous stomatitis in his mouth with several canker sores on his buccal mucosa do believe likely due to therapy.  We did strep today in office due to his  throat that was negative. Past Medical History:  Diagnosis Date   Allergy    BPH (benign prostatic hypertrophy)    Cancer (HCC)    Melanoma on Neck    2008   Carotid artery occlusion    Diabetes mellitus    type 2   ED (erectile dysfunction)    GERD (gastroesophageal reflux disease)    Hemorrhagic stroke ([facility]) 06/2021   Hyperlipidemia    Hypertension    Lung cancer ([place])    Retinopathy due to secondary DM [facility])    Past Surgical History:  Procedure Laterality Date   BRONCHIAL NEEDLE ASPIRATION BIOPSY  06/17/2021   Procedure: BRONCHIAL NEEDLE ASPIRATION BIOPSIES;  Surgeon: Candee Furbish, MD;  Location: [facility] ENDOSCOPY;  Service: Pulmonary;;   IR IMAGING GUIDED PORT INSERTION  08/05/2021   MELANOMA EXCISION  2008   Left side of neck   RADIOLOGY WITH ANESTHESIA N/A 04/05/2020   Procedure: MRI SPINE WITOUT CONTRAST;  Surgeon: Radiologist, Medication, MD;  Location: [place];  Service: Radiology;  Laterality: N/A;   RADIOLOGY WITH ANESTHESIA N/A 08/22/2021   Procedure: MRI BRAIN WITH AND WITHOUT CONTRAST  WITH ANESTHESIA;  Surgeon: Radiologist, Medication, MD;  Location: [place];  Service: Radiology;  Laterality: N/A;   RADIOLOGY WITH ANESTHESIA N/A 12/17/2021   Procedure: MRI BRAIN WITH AND WITHOUT CONTRAST WITH ANESTHESIA; MRI LUMBER WITH AND WITHOUT CONTRAST;  Surgeon: Radiologist, Medication, MD;  Location: [place];  Service: Radiology;  Laterality: N/A;   TONSILLECTOMY     VIDEO BRONCHOSCOPY WITH ENDOBRONCHIAL ULTRASOUND N/A 06/17/2021   Procedure: VIDEO BRONCHOSCOPY WITH ENDOBRONCHIAL ULTRASOUND;  Surgeon: Candee Furbish, MD;  Location[facility] ENDOSCOPY;  Service: Pulmonary;  Laterality: N/A;   Current Outpatient Medications on File Prior to Visit  Medication Sig Dispense Refill   aspirin EC 81 MG tablet Take 1 tablet (81 mg total) by mouth daily. Swallow whole. 30 tablet 12   atorvastatin (LIPITOR) 40 MG tablet Take 1 tablet (40 mg total) by mouth daily. 60 tablet 2   Ca  Phosphate-Cholecalciferol (CALCIUM WITH D3 PO) Take 1 capsule by mouth daily.     dexamethasone (DECADRON) 4 MG tablet Take 1 tab every 12 hours the day before pemetrexed chemo, then take 2 tabs once a day for 3 days starting the day after carboplatin. 30 tablet 1   furosemide (LASIX) 20 MG tablet Take 1 tablet (20 mg total) by mouth daily as needed (leg swelling or sob). 30 tablet 3   HYDROcodone bit-homatropine (HYCODAN) 5-1.5 MG/5ML syrup Take 5 mLs by mouth every 8 (eight) hours as needed for cough. (Patient not taking: Reported on 02/25/2022) 120 mL 0   LORazepam (ATIVAN) 1 MG tablet Take 1 tablet (1 mg total) by mouth at bedtime. 30 tablet 2   NOVOLOG FLEXPEN 100 UNIT/ML FlexPen Inject 0-10 Units into the skin daily as needed for high blood sugar (above 200). Sliding scale     omeprazole (PRILOSEC) 20 MG capsule  Take 20 mg by mouth daily.     oxyCODONE-acetaminophen (PERCOCET) 7.5-325 MG tablet Take 1 tablet by mouth every 4 (four) hours as needed for severe pain. Do not take with cough medication or other pain medication 30 tablet 0   tamsulosin (FLOMAX) 0.4 MG CAPS capsule TAKE 1 CAPSULE(0.4 MG) BY MOUTH DAILY 90 capsule 0   triamcinolone ointment (KENALOG) 0.5 % Apply 1 Application topically 2 (two) times daily. 30 g 0   UNABLE TO FIND PLEASE CHECK FASTING BLOOD GLUCOSE EVERY MORNING 1 each 0   [DISCONTINUED] prochlorperazine (COMPAZINE) 10 MG tablet Take 1 tablet (10 mg total) by mouth every 6 (six) hours as needed (Nausea or vomiting). 30 tablet 1   Current Facility-Administered Medications on File Prior to Visit  Medication Dose Route Frequency Provider Last Rate Last Admin   heparin lock flush 100 UNIT/ML injection            Allergies  Allergen Reactions   Niaspan [Niacin]     FLUSHING   Social History   Socioeconomic History   Marital status: Married    Spouse name: Not on file   Number of children: Not on file   Years of education: Not on file   Highest education level:  Not on file  Occupational History   Not on file  Tobacco Use   Smoking status: Former    Types: Cigarettes    Quit date: 07/21/1990    Years since quitting: 31.6   Smokeless tobacco: Never  Vaping Use   Vaping Use: Never used  Substance and Sexual Activity   Alcohol use: No   Drug use: No   Sexual activity: Not on file  Other Topics Concern   Not on file  Social History Narrative   Not on file   Social Determinants of Health   Financial Resource Strain: Low Risk  (11/21/2021)   Overall Financial Resource Strain (CARDIA)    Difficulty of Paying Living Expenses: Not hard at all  Food Insecurity: No Food Insecurity (11/19/2021)   Hunger Vital Sign    Worried About Running Out of Food in the Last Year: Never true    [place] in the Last Year: Never true  Transportation Needs: No Transportation Needs (11/21/2021)   PRAPARE - Hydrologist (Medical): No    Lack of Transportation (Non-Medical): No  Physical Activity: Inactive (07/31/2021)   Exercise Vital Sign    Days of Exercise per Week: 0 days    Minutes of Exercise per Session: 0 min  Stress: Unknown (07/31/2021)   [place]    Feeling of Stress : Patient refused  Social Connections: Unknown (07/31/2021)   Social Connection and Isolation Panel [NHANES]    Frequency of Communication with Friends and Family: Three times a week    Frequency of Social Gatherings with Friends and Family: Three times a week    Attends Religious Services: Patient refused    Active Member of Clubs or Organizations: Patient refused    Attends Archivist Meetings: Patient refused    Marital Status: Married  Human resources officer Violence: Not At Risk (07/31/2021)   Humiliation, Afraid, Rape, and Kick questionnaire    Fear of Current or Ex-Partner: No    Emotionally Abused: No    Physically Abused: No    Sexually Abused: No      Review of  Systems  Musculoskeletal:  Positive for back pain.  All other systems reviewed and are negative.      Objective:   Physical Exam Vitals reviewed.  Constitutional:      Appearance: He is ill-appearing. He is not diaphoretic.  HENT:     Mouth/Throat:      Comments: Canker sores on bilateral buccal mucosa Cardiovascular:     Rate and Rhythm: Normal rate and regular rhythm.     Heart sounds: Normal heart sounds.  Pulmonary:     Effort: Pulmonary effort is normal.     Breath sounds: Normal breath sounds. No wheezing or rales.  Musculoskeletal:     Lumbar back: Tenderness and bony tenderness present. Decreased range of motion.     Right lower leg: No edema.     Left lower leg: No edema.  Skin:    Findings: Erythema present.                Assessment & Plan:  Sore throat - Plan: STREP GROUP A AG, W/REFLEX TO CULT  Acute systolic congestive heart failure (HCC)  Spondylosis of lumbar spine  Closed fracture of second lumbar vertebra, unspecified fracture morphology, initial encounter ([place]) 1) The patient's sore throat is due to the aphthous stomatitis in his mouth likely from chemotherapy.  I offered the patient triamcinolone dental paste for symptomatic treatment but he deferred.  He states that he can tolerate this. 2) patient has congestive heart failure.  He no longer is requiring Lasix as the swelling in his legs has improved.  However he does have untreated systolic heart failure.  I will replace the order for him to see cardiology so they can schedule a follow-up appointment and I will start valsartan 80 mg a day for treatment of heart failure.  I will defer adding Jardiance to his follow-up appointment with his cardiologist. 3) patient is in severe back pain.  We discussed starting fentanyl versus Suboxone.  He is in severe pain with very poor quality of life.  The patient is trying to conceal the pain for me today but there is a tear forming in his eye while we are  discussing his pain.  He is very slow to move due to the pain in his back.  Therefore we will start him on fentanyl 25 mcg transdermal every 72 hours to try to get better control of his pain.  He can use Percocet for breakthrough pain.

## 2022-03-09 LAB — CULTURE, GROUP A STREP
MICRO NUMBER:: 14394166
SPECIMEN QUALITY:: ADEQUATE

## 2022-03-09 LAB — STREP GROUP A AG, W/REFLEX TO CULT: Streptococcus Group A AG: NOT DETECTED

## 2022-03-10 ENCOUNTER — Ambulatory Visit: Payer: PPO | Admitting: Family Medicine

## 2022-03-12 ENCOUNTER — Other Ambulatory Visit: Payer: Self-pay

## 2022-03-12 DIAGNOSIS — C3412 Malignant neoplasm of upper lobe, left bronchus or lung: Secondary | ICD-10-CM

## 2022-03-13 MED ORDER — DEXAMETHASONE 4 MG PO TABS: ORAL_TABLET | ORAL | 1 refills | Status: DC

## 2022-03-13 NOTE — Telephone Encounter (Signed)
His PCP just filled pain medication (long acting) He needs to have [place] or them filling his pain medications; not both.

## 2022-03-14 ENCOUNTER — Other Ambulatory Visit: Payer: Self-pay | Admitting: Family Medicine

## 2022-03-14 ENCOUNTER — Telehealth: Payer: Self-pay | Admitting: Internal Medicine

## 2022-03-14 ENCOUNTER — Telehealth: Payer: Self-pay

## 2022-03-14 MED ORDER — OXYCODONE-ACETAMINOPHEN 7.5-325 MG PO TABS: 1.0000 | ORAL_TABLET | ORAL | 0 refills | Status: DC | PRN

## 2022-03-14 MED ORDER — FENTANYL 50 MCG/HR TD PT72: 1.0000 | MEDICATED_PATCH | TRANSDERMAL | 0 refills | Status: DC

## 2022-03-14 NOTE — Telephone Encounter (Signed)
On call page received-   Patient requesting percocet refill. Will refill for the weekend.   On PMP aware- patient also recently started on fentanyl patch 25 mcg by primary.   Cc Dr. Cathie Hoops for longer refill.

## 2022-03-14 NOTE — Telephone Encounter (Signed)
Pt's daughter, French Ana, called and states that her Dad advised the Fentenal patches are not helping his back pain. Is there anything else he can use? Thank you.

## 2022-03-14 NOTE — Progress Notes (Signed)
fenta

## 2022-03-17 ENCOUNTER — Other Ambulatory Visit: Payer: Self-pay | Admitting: Oncology

## 2022-03-17 MED ORDER — OXYCODONE-ACETAMINOPHEN 7.5-325 MG PO TABS: 1.0000 | ORAL_TABLET | ORAL | 0 refills | Status: DC | PRN

## 2022-03-17 NOTE — Telephone Encounter (Signed)
Please see if Micheal Donovan (palliative) has room to see him tomorrow while he is here in infusion. If not, then please schedule pt to see [name] another day.

## 2022-03-18 ENCOUNTER — Other Ambulatory Visit: Payer: Self-pay

## 2022-03-18 ENCOUNTER — Telehealth: Payer: Self-pay | Admitting: *Deleted

## 2022-03-18 ENCOUNTER — Inpatient Hospital Stay: Payer: PPO | Attending: Radiation Oncology

## 2022-03-18 ENCOUNTER — Inpatient Hospital Stay (HOSPITAL_BASED_OUTPATIENT_CLINIC_OR_DEPARTMENT_OTHER): Payer: PPO | Admitting: Oncology

## 2022-03-18 ENCOUNTER — Other Ambulatory Visit: Payer: Self-pay | Admitting: Oncology

## 2022-03-18 ENCOUNTER — Inpatient Hospital Stay: Payer: PPO

## 2022-03-18 ENCOUNTER — Inpatient Hospital Stay (HOSPITAL_BASED_OUTPATIENT_CLINIC_OR_DEPARTMENT_OTHER): Payer: PPO | Admitting: Hospice and Palliative Medicine

## 2022-03-18 ENCOUNTER — Encounter: Payer: Self-pay | Admitting: Oncology

## 2022-03-18 VITALS — BP 105/69 | HR 94 | Temp 96.1°F | Resp 18 | Wt 146.2 lb

## 2022-03-18 DIAGNOSIS — T451X5A Adverse effect of antineoplastic and immunosuppressive drugs, initial encounter: Secondary | ICD-10-CM | POA: Insufficient documentation

## 2022-03-18 DIAGNOSIS — G4700 Insomnia, unspecified: Secondary | ICD-10-CM | POA: Insufficient documentation

## 2022-03-18 DIAGNOSIS — C3412 Malignant neoplasm of upper lobe, left bronchus or lung: Secondary | ICD-10-CM

## 2022-03-18 DIAGNOSIS — I2699 Other pulmonary embolism without acute cor pulmonale: Secondary | ICD-10-CM | POA: Diagnosis not present

## 2022-03-18 DIAGNOSIS — C7931 Secondary malignant neoplasm of brain: Secondary | ICD-10-CM | POA: Insufficient documentation

## 2022-03-18 DIAGNOSIS — Z7982 Long term (current) use of aspirin: Secondary | ICD-10-CM | POA: Diagnosis not present

## 2022-03-18 DIAGNOSIS — D649 Anemia, unspecified: Secondary | ICD-10-CM

## 2022-03-18 DIAGNOSIS — R7989 Other specified abnormal findings of blood chemistry: Secondary | ICD-10-CM

## 2022-03-18 DIAGNOSIS — Z87891 Personal history of nicotine dependence: Secondary | ICD-10-CM | POA: Diagnosis not present

## 2022-03-18 DIAGNOSIS — M545 Low back pain, unspecified: Secondary | ICD-10-CM | POA: Insufficient documentation

## 2022-03-18 DIAGNOSIS — Z515 Encounter for palliative care: Secondary | ICD-10-CM | POA: Diagnosis not present

## 2022-03-18 DIAGNOSIS — Z923 Personal history of irradiation: Secondary | ICD-10-CM | POA: Insufficient documentation

## 2022-03-18 DIAGNOSIS — C7951 Secondary malignant neoplasm of bone: Secondary | ICD-10-CM | POA: Diagnosis not present

## 2022-03-18 DIAGNOSIS — D6481 Anemia due to antineoplastic chemotherapy: Secondary | ICD-10-CM | POA: Diagnosis not present

## 2022-03-18 DIAGNOSIS — G893 Neoplasm related pain (acute) (chronic): Secondary | ICD-10-CM

## 2022-03-18 DIAGNOSIS — I10 Essential (primary) hypertension: Secondary | ICD-10-CM | POA: Insufficient documentation

## 2022-03-18 DIAGNOSIS — Z79899 Other long term (current) drug therapy: Secondary | ICD-10-CM | POA: Diagnosis not present

## 2022-03-18 DIAGNOSIS — Z794 Long term (current) use of insulin: Secondary | ICD-10-CM | POA: Insufficient documentation

## 2022-03-18 DIAGNOSIS — E119 Type 2 diabetes mellitus without complications: Secondary | ICD-10-CM | POA: Diagnosis not present

## 2022-03-18 DIAGNOSIS — Z86711 Personal history of pulmonary embolism: Secondary | ICD-10-CM | POA: Diagnosis not present

## 2022-03-18 DIAGNOSIS — D75839 Thrombocytosis, unspecified: Secondary | ICD-10-CM | POA: Diagnosis not present

## 2022-03-18 DIAGNOSIS — Z7952 Long term (current) use of systemic steroids: Secondary | ICD-10-CM | POA: Insufficient documentation

## 2022-03-18 DIAGNOSIS — Z95828 Presence of other vascular implants and grafts: Secondary | ICD-10-CM

## 2022-03-18 LAB — COMPREHENSIVE METABOLIC PANEL
ALT: 36 U/L (ref 0–44)
AST: 50 U/L — ABNORMAL HIGH (ref 15–41)
Albumin: 2.9 g/dL — ABNORMAL LOW (ref 3.5–5.0)
Alkaline Phosphatase: 166 U/L — ABNORMAL HIGH (ref 38–126)
Anion gap: 14 (ref 5–15)
BUN: 18 mg/dL (ref 8–23)
CO2: 22 mmol/L (ref 22–32)
Calcium: 8 mg/dL — ABNORMAL LOW (ref 8.9–10.3)
Chloride: 93 mmol/L — ABNORMAL LOW (ref 98–111)
Creatinine, Ser: 1.85 mg/dL — ABNORMAL HIGH (ref 0.61–1.24)
GFR, Estimated: 38 mL/min — ABNORMAL LOW (ref >60)
Glucose, Bld: 285 mg/dL — ABNORMAL HIGH (ref 70–99)
Potassium: 3.3 mmol/L — ABNORMAL LOW (ref 3.5–5.1)
Sodium: 129 mmol/L — ABNORMAL LOW (ref 135–145)
Total Bilirubin: 0.2 mg/dL — ABNORMAL LOW (ref 0.3–1.2)
Total Protein: 6.4 g/dL — ABNORMAL LOW (ref 6.5–8.1)

## 2022-03-18 LAB — CBC WITH DIFFERENTIAL/PLATELET
Abs Immature Granulocytes: 1.85 10*3/uL — ABNORMAL HIGH (ref 0.00–0.07)
Basophils Absolute: 0.2 10*3/uL — ABNORMAL HIGH (ref 0.0–0.1)
Basophils Relative: 0 %
Eosinophils Absolute: 0 10*3/uL (ref 0.0–0.5)
Eosinophils Relative: 0 %
HCT: 23.2 % — ABNORMAL LOW (ref 39.0–52.0)
Hemoglobin: 7.7 g/dL — ABNORMAL LOW (ref 13.0–17.0)
Immature Granulocytes: 4 %
Lymphocytes Relative: 2 %
Lymphs Abs: 0.9 10*3/uL (ref 0.7–4.0)
MCH: 35.6 pg — ABNORMAL HIGH (ref 26.0–34.0)
MCHC: 33.2 g/dL (ref 30.0–36.0)
MCV: 107.4 fL — ABNORMAL HIGH (ref 80.0–100.0)
Monocytes Absolute: 1.1 10*3/uL — ABNORMAL HIGH (ref 0.1–1.0)
Monocytes Relative: 2 %
Neutro Abs: 40.7 10*3/uL — ABNORMAL HIGH (ref 1.7–7.7)
Neutrophils Relative %: 92 %
Platelets: 444 10*3/uL — ABNORMAL HIGH (ref 150–400)
RBC: 2.16 MIL/uL — ABNORMAL LOW (ref 4.22–5.81)
RDW: 19.1 % — ABNORMAL HIGH (ref 11.5–15.5)
WBC: 44.7 10*3/uL — ABNORMAL HIGH (ref 4.0–10.5)
nRBC: 0 % (ref 0.0–0.2)

## 2022-03-18 LAB — PREPARE RBC (CROSSMATCH)

## 2022-03-18 MED ORDER — HEPARIN SOD (PORK) LOCK FLUSH 100 UNIT/ML IV SOLN
500.0000 [IU] | Freq: Once | INTRAVENOUS | Status: AC
  Administered 2022-03-18: 500 [IU] via INTRAVENOUS
  Filled 2022-03-18: qty 5

## 2022-03-18 MED ORDER — SODIUM CHLORIDE 0.9% FLUSH
10.0000 mL | INTRAVENOUS | Status: DC | PRN
  Administered 2022-03-18: 10 mL via INTRAVENOUS
  Filled 2022-03-18: qty 10

## 2022-03-18 MED ORDER — CYCLOBENZAPRINE HCL 5 MG PO TABS: 5.0000 mg | ORAL_TABLET | Freq: Two times a day (BID) | ORAL | 1 refills | Status: DC | PRN

## 2022-03-18 NOTE — Assessment & Plan Note (Addendum)
Recommend Zometa every 4- 6 weeks.-Hold today Recommend calcium supplementation.  Repeat MRI lumbar with and without contrast for evaluation.  Possible need of IR consultation/kyphoplasty

## 2022-03-18 NOTE — Assessment & Plan Note (Addendum)
Likely reactive

## 2022-03-18 NOTE — Patient Instructions (Signed)

## 2022-03-18 NOTE — Progress Notes (Signed)
Hematology/Oncology Progress note [phone] [phone]            Patient Care Team: [name], MD as PCP - General (Family Medicine) [name], RN as Oncology Nurse Navigator   ASSESSMENT & PLAN:   Cancer Staging  Primary non-small cell carcinoma of upper lobe of left lung [facility]) Staging form: Lung, AJCC 8th Edition - Clinical: Stage IV (cT2b, cN1, cM1) - Signed by [name], MD on 08/06/2021   Primary non-small cell carcinoma of upper lobe of left lung ([place]) Stage IV lung adenocarcinoma with brain and bone metastasis.  PET scan images were reviewed with patient.- good partial response.  Labs are reviewed and discussed with patient. Hold  maintenance Alimta/Keytruda today. -dose reduce Alimta 350mg /m2 Kidney function is worse today-likely due to overdiuresis.  I will hold off repeating CT with contrast right away until hopefully his kidney function improved  Metastasis to bone (HCC) Recommend Zometa every 4- 6 weeks.-Hold today Recommend calcium supplementation.  Repeat MRI lumbar with and without contrast for evaluation.  Possible need of IR consultation/kyphoplasty  Neoplasm related pain Back pain, MRI lumbar showed compression fracture Continue PRN percocet, recently added fentanyl patch recommend Flexeril  I discussed with palliative care service [name] who will see him today as. Well. I agree with repeating MRI lumbar, possible IR eval for kyphoplasty   Anemia due to antineoplastic chemotherapy Symptomatic anemia, recommend 1 unit of PRBC transfusion  Pulmonary embolus (HCC) MRI brain shows persistent microbleeds.   Off Eliquis 2.5mg  BID.   Thrombocytosis Likely reactive    Orders Placed This Encounter  Procedures   Vitamin B12    Standing Status:   Future    Standing Expiration Date:   04/01/2023   CBC with Differential    Standing Status:   Future    Standing Expiration Date:   04/02/2023   Comprehensive  metabolic panel    Standing Status:   Future    Standing Expiration Date:   04/02/2023   Type and screen    Standing Status:   Future    Number of Occurrences:   1    Standing Expiration Date:   03/18/2023   All questions were answered. The patient knows to call the clinic with any problems, questions or concerns.  [name], MD, [name] [facility] Hematology Oncology 03/18/2022      CHIEF COMPLAINTS/REASON FOR VISIT:  non-small cell lung cancer  HISTORY OF PRESENTING ILLNESS:   Micheal Donovan is a  72 y.o.  male presents for follow up of Non-small cell lung cancer.  Oncology History Overview Note  Diagnosis: Stage IIB T2b N1 M0 adenocarcinoma of the LUL, poorly differentiated     Primary non-small cell carcinoma of upper lobe of left lung ([place])  06/13/2021 Initial Diagnosis   Primary non-small cell carcinoma of upper lobe of left lung [facility]) -06/20/2021 - 06/27/2021, patient presented to [facility] due to progressive headache/dizziness/gait changes. CT head showed acute to subacute intraparenchymal hemorrhage involving the left cerebellum.  Surrounding low-density vasogenic edema.  Patient was transferred to [facility]. This was further evaluated by CT angiogram of the neck which showed bulky calcified plaques/stenosis of carotid artery, Followed by MRI brain. 06/12/2021, MRI of the brain showed no significant interval change in size of the left cerebellar intraparenchymal hematoma with unchanged regional mass effect and partial effacement of fourth ventricle but no upstream hydrocephalus. There is no discernible enhancement to suggest underlying mass lesion, though acute blood products  could mask enhancement.   06/12/2021 a chest x-ray showed a 4.4 cm left middle lobe. 06/13/2021, CT chest with contrast showed a 4.3 x 3.6 x 3.3 cm lobular spiculated mass in the posterior left upper lobe with T3 to the lateral pleura and major fissure.  Metastatic left hilar lymphadenopathy.   Peripheral micronodularity posterior right costophrenic sulcus.  Aortic atherosclerosis. 06/14/2021, CT abdomen pelvis showed right lower lobe pulmonary artery embolus.  No evidence of right heart strain.  No acute intra-abdominal or pelvic pathology.  Aortic atherosclerosis.  06/13/2021, patient underwent bronchoscopy with EBUS by Dr. Katrinka Blazing.  Biopsy from the fine-needle aspiration of station 11 mL showed malignant cells, consistent with poorly differentiated non-small cell carcinoma, consistent with adenocarcinoma.  Malignant cells are TTF-1 positive and negative for p40.  Negative for neuroendocrine markers.  Blood test and tissue sample were tested for Gardant 360  -PD-L1 TPS 97%, no actionable mutation on the blood testing. 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   07/17/2021 Cancer Staging   Staging form: Lung, AJCC 8th Edition - Clinical: Stage IV (cT2b, cN1, cM1) - Signed by [name], MD on 08/06/2021   08/06/2021 Imaging   I saw patient's PET scan after his visit with me on 08/06/21. PET scan was ordered by his previous oncologist group and did not come to my in basket.. Patient received cycle 1 carboplatin Taxol today.  He has started on radiation.  5/26/3 PET scan showed 3.8 cm hypermetabolic left upper lobe mass with hypermetabolic left hilar and infrahilar adenopathy.  There is approximately 8 scattered metastatic lesions in the skeleton. Mixed density photopenic lesion in the left cerebellum. characterized as hemorrhage on recent prior imaging workups.    08/06/2021 - 08/06/2021 Chemotherapy   Patient is on Treatment Plan : LUNG Carboplatin / Paclitaxel + XRT q7d     08/16/2021 -  Chemotherapy   Switched to systemic chemotherapy with  Carboplatin (4.5) + Pemetrexed (400) + Pembrolizumab (200) D1 q21d Induction x 4 cycles      08/16/2021 -  Chemotherapy   Patient is on Treatment Plan : LUNG Carboplatin (5) + Pemetrexed + Pembrolizumab (200) D1 q21d Induction x 4 cycles /  Maintenance Pemetrexed + Pembrolizumab (200) D1 q21d     08/22/2021 Imaging   MRI brain w wo contrast  Decrease in size of left cerebellar hematoma with resolution of edema. No evidence of underlying lesion. Smaller, more recent hemorrhage in the left cerebellar vermis with minimal edema. There is minimal enhancement without definite evidence of underlying lesion.  New punctate focus of chronic blood products and enhancement in the left frontal lobe. Additional new foci of chronic blood products in the posterior right putamen, right parietal subcortical white matter, and posterior right cerebellum. Unclear at this time if these represent foci of bland hemorrhage or early metastases.   Increase in size of right parietal osseous metastasis with minor extraosseous extension.    10/22/2021 - 10/22/2021 Chemotherapy   Patient is on Treatment Plan : LUNG Carboplatin (5) + Pemetrexed (500) + Pembrolizumab (200) D1 q21d Induction x 4 cycles / Maintenance Pemetrexed (500) + Pembrolizumab (200) D1 q21d      Imaging   PET scan showed 1. Interval response to therapy as evidenced by a small residual left upper lobe nodule with decreased hypermetabolism, no residual hypermetabolic adenopathy and decreased hypermetabolism associatedwith osseous metastases. 2. 6 mm posterior left upper lobe nodule, likely stable. Recommend attention on follow-up. 3. Aortic atherosclerosis (ICD10-I70.0). Coronary artery calcification.   12/17/2021  Imaging   MRI lumbar spine w wo contrast  1. No evidence of regional metastatic disease. 2. Late subacute superior endplate fracture at L2 and large superior endplate Schmorl's node at L5, unchanged since the PET scan of 10/29/2021. 3. L3-4: Shallow disc protrusion. Facet and ligamentous hypertrophy. Stenosis of both lateral recesses. Findings slightly worsened since 2022. 4. L4-5: Shallow disc protrusion. Facet and ligamentous hypertrophy. Small synovial cyst arising from the facet  joint on the right. Stenosis of the lateral recesses right worse than left. Foraminal narrowing right worse than left. Findings have worsened since 2022.  5. L5-S1: Endplate osteophytes and shallow protrusion of the disc. Facet and ligamentous hypertrophy. Stenosis of the subarticular lateral recesses and neural foramina, right worse than left. Similar appearance to the study of 2022. 6. Continued evidence of enteritis of at least 1 loop of small bowel. Distended bladder present   12/18/2021 Imaging   Brian MRI w wo  1. Interval decrease in size of the left cerebellar and vermian hematomas. No definite evidence of underlying metastatic lesion. 2. There is are two possible contrast enhancing lesions in the posterior left frontal lobe and left occipital lobe, which do not have intrinsic T1 signal abnormality, but have a somewhat linear appearance and may be vascular in nature. Recommend attention on follow up. 3. Multifocal sites of susceptibility artifact are redemonstrated with interval development of a few new foci, as described above. All of these sites demonstrate intrinsic T1 hyperintense signal abnormality and susceptibility artifact and are compatible with sites of microhemorrhages. Recommend continued attention on follow up. 4. Interval decrease in size of right parietal calvarial metastatic lesion. No new contrast enhancing lesions visualized. 5. Diffusely heterogeneous marrow signal throughout the cervical spine, which is nonspecific but can be seen in the setting of anemia, smoking, obesity, or a marrow replacement process.     12/18/2021 Imaging   MRI lumbar spine w wo contrast  1. No evidence of regional metastatic disease. 2. Late subacute superior endplate fracture at L2 and large superior endplate Schmorl's node at L5, unchanged since the PET scan of 10/29/2021 3. L3-4: Shallow disc protrusion. Facet and ligamentous hypertrophy.Stenosis of both lateral recesses. Findings slightly  worsened since 2022. 4. L4-5: Shallow disc protrusion. Facet and ligamentous hypertrophy. Small synovial cyst arising from the facet joint on the right. Stenosis of the lateral recesses right worse than left. Foraminal narrowing right worse than left. Findings have worsened since 2022. 5. L5-S1: Endplate osteophytes and shallow protrusion of the disc. Facet and ligamentous hypertrophy. Stenosis of the subarticular lateral recesses and neural foramina, right worse than left. Similar appearance to the study of 2022. 6. Continued evidence of enteritis of at least 1 loop of small bowel. Distended bladder present.       01/28/2022 Imaging   CT chest abdomen pelvis w contrast 1. Treated left upper lobe mass appears grossly stable to the prior examination when measured in a similar fashion on the prior study. No definite signs of extra skeletal metastatic disease noted in the chest, abdomen or pelvis. 2. Widespread skeletal metastases redemonstrated, as above. 3. Hepatic steatosis. 4. Aortic atherosclerosis, in addition to left main and three-vessel coronary artery disease. Assessment for potential risk factor modification, dietary therapy or pharmacologic therapy may be warranted, if clinically indicated. 5. Additional incidental findings,    # Patient has a history of melanoma on his neck, treated in 2009. # History of hemorrhagic infarct left cerebellum  INTERVAL HISTORY Micheal Donovan is a 72 y.o.  male who has above history reviewed by me today presents for follow up visit for management of  Stage IV lung adenocarcianoma # Pulmonary embolism, SOB is stable, not worse. Off Eliquis due to MRI findings.  + Insomnia and nocturia. On Ativan QHS, not helping + Lower extremity swelling, started on Lasix, swelling has improved in calf, still some swelling in his feet.  + Feeling weak.  Loss of appetite. Wife reports that patient is in bed all day long.  + back pain, on fentanyl patch and percocet,  pain is relieved at rest, but still have pain when he moves around or bends down  Accompanied by wife.    Review of Systems  Constitutional:  Positive for fatigue. Negative for appetite change, chills, fever and unexpected weight change.  HENT:   Negative for hearing loss and voice change.   Eyes:  Negative for eye problems and icterus.  Respiratory:  Positive for shortness of breath. Negative for chest tightness and cough.   Cardiovascular:  Positive for leg swelling. Negative for chest pain.  Gastrointestinal:  Negative for abdominal distention, abdominal pain and blood in stool.  Endocrine: Negative for hot flashes.  Genitourinary:  Negative for difficulty urinating, dysuria and frequency.   Musculoskeletal:  Positive for back pain. Negative for arthralgias.  Skin:  Negative for itching and rash.  Neurological:  Negative for extremity weakness, headaches, light-headedness and numbness.  Hematological:  Negative for adenopathy. Does not bruise/bleed easily.  Psychiatric/Behavioral:  Positive for sleep disturbance. Negative for confusion.     MEDICAL HISTORY:  Past Medical History:  Diagnosis Date   Allergy    BPH (benign prostatic hypertrophy)    Cancer (HCC)    Melanoma on Neck    2008   Carotid artery occlusion    Diabetes mellitus    type 2   ED (erectile dysfunction)    GERD (gastroesophageal reflux disease)    Hemorrhagic stroke (HCC) 06/2021   Hyperlipidemia    Hypertension    Lung cancer (HCC)    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)     SURGICAL HISTORY: Past Surgical History:  Procedure Laterality Date   BRONCHIAL NEEDLE ASPIRATION BIOPSY  06/17/2021   Procedure: BRONCHIAL NEEDLE ASPIRATION BIOPSIES;  Surgeon: Lorin Glass, MD;  Location[facility] ENDOSCOPY;  Service: Pulmonary;;   IR IMAGING GUIDED PORT INSERTION  08/05/2021   MELANOMA EXCISION  2008   Left side of neck   RADIOLOGY WITH ANESTHESIA N/A 04/05/2020   Procedure: MRI SPINE WITOUT CONTRAST;  Surgeon:  Radiologist, Medication, MD;  Location: [facility] OR;  Service: Radiology;  Laterality: N/A;   RADIOLOGY WITH ANESTHESIA N/A 08/22/2021   Procedure: MRI BRAIN WITH AND WITHOUT CONTRAST  WITH ANESTHESIA;  Surgeon: Radiologist, Medication, MD;  Location: [facility] OR;  Service: Radiology;  Laterality: N/A;   RADIOLOGY WITH ANESTHESIA N/A 12/17/2021   Procedure: MRI BRAIN WITH AND WITHOUT CONTRAST WITH ANESTHESIA; MRI LUMBER WITH AND WITHOUT CONTRAST;  Surgeon: Radiologist, Medication, MD;  Location: [facility] OR;  Service: Radiology;  Laterality: N/A;   TONSILLECTOMY     VIDEO BRONCHOSCOPY WITH ENDOBRONCHIAL ULTRASOUND N/A 06/17/2021   Procedure: VIDEO BRONCHOSCOPY WITH ENDOBRONCHIAL ULTRASOUND;  Surgeon: Lorin Glass, MD;  Location[facility] ENDOSCOPY;  Service: Pulmonary;  Laterality: N/A;    SOCIAL HISTORY: Social History   Socioeconomic History   Marital status: Married    Spouse name: Not on file   Number of children: Not on file   Years of education: Not on file   Highest  education level: Not on file  Occupational History   Not on file  Tobacco Use   Smoking status: Former    Types: Cigarettes    Quit date: 07/21/1990    Years since quitting: 31.6   Smokeless tobacco: Never  Vaping Use   Vaping Use: Never used  Substance and Sexual Activity   Alcohol use: No   Drug use: No   Sexual activity: Not on file  Other Topics Concern   Not on file  Social History Narrative   Not on file   Social Determinants of Health   Financial Resource Strain: Low Risk  (11/21/2021)   Overall Financial Resource Strain (CARDIA)    Difficulty of Paying Living Expenses: Not hard at all  Food Insecurity: No Food Insecurity (11/19/2021)   Hunger Vital Sign    Worried About Running Out of Food in the Last Year: Never true    Ran Out of Food in the Last Year: Never true  Transportation Needs: No Transportation Needs (11/21/2021)   PRAPARE - Administrator, Civil Service (Medical): No    Lack of Transportation  (Non-Medical): No  Physical Activity: Inactive (07/31/2021)   Exercise Vital Sign    Days of Exercise per Week: 0 days    Minutes of Exercise per Session: 0 min  Stress: Unknown (07/31/2021)   [employer] of Occupational Health - Occupational Stress Questionnaire    Feeling of Stress : Patient refused  Social Connections: Unknown (07/31/2021)   Social Connection and Isolation Panel [NHANES]    Frequency of Communication with Friends and Family: Three times a week    Frequency of Social Gatherings with Friends and Family: Three times a week    Attends Religious Services: Patient refused    Active Member of Clubs or Organizations: Patient refused    Attends Banker Meetings: Patient refused    Marital Status: Married  Catering manager Violence: Not At Risk (07/31/2021)   Humiliation, Afraid, Rape, and Kick questionnaire    Fear of Current or Ex-Partner: No    Emotionally Abused: No    Physically Abused: No    Sexually Abused: No    FAMILY HISTORY: Family History  Problem Relation Age of Onset   COPD Mother    Heart disease Father    Heart disease Brother        MI at age 55   Stroke Neg Hx     ALLERGIES:  is allergic to niaspan [niacin].  MEDICATIONS:  Current Outpatient Medications  Medication Sig Dispense Refill   aspirin EC 81 MG tablet Take 1 tablet (81 mg total) by mouth daily. Swallow whole. 30 tablet 12   atorvastatin (LIPITOR) 40 MG tablet Take 1 tablet (40 mg total) by mouth daily. 60 tablet 2   Ca Phosphate-Cholecalciferol (CALCIUM WITH D3 PO) Take 1 capsule by mouth daily.     cyclobenzaprine (FLEXERIL) 5 MG tablet Take 1 tablet (5 mg total) by mouth 2 (two) times daily as needed for muscle spasms. 60 tablet 1   dexamethasone (DECADRON) 4 MG tablet Take 1 tab every 12 hours the day before pemetrexed chemo, then take 2 tabs once a day for 3 days starting the day after carboplatin. 30 tablet 1   fentaNYL (DURAGESIC) 50 MCG/HR Place 1 patch onto the  skin every 3 (three) days. 10 patch 0   furosemide (LASIX) 20 MG tablet Take 1 tablet (20 mg total) by mouth daily as needed (leg swelling or sob). 30 tablet  3   LORazepam (ATIVAN) 1 MG tablet Take 1 tablet (1 mg total) by mouth at bedtime. 30 tablet 2   NOVOLOG FLEXPEN 100 UNIT/ML FlexPen Inject 0-10 Units into the skin daily as needed for high blood sugar (above 200). Sliding scale     omeprazole (PRILOSEC) 20 MG capsule Take 20 mg by mouth daily.     tamsulosin (FLOMAX) 0.4 MG CAPS capsule TAKE 1 CAPSULE(0.4 MG) BY MOUTH DAILY 90 capsule 0   triamcinolone ointment (KENALOG) 0.5 % Apply 1 Application topically 2 (two) times daily. 30 g 0   UNABLE TO FIND PLEASE CHECK FASTING BLOOD GLUCOSE EVERY MORNING 1 each 0   valsartan (DIOVAN) 80 MG tablet Take 1 tablet (80 mg total) by mouth daily. 90 tablet 3   HYDROcodone bit-homatropine (HYCODAN) 5-1.5 MG/5ML syrup Take 5 mLs by mouth every 8 (eight) hours as needed for cough. (Patient not taking: Reported on 02/25/2022) 120 mL 0   oxyCODONE-acetaminophen (PERCOCET) 7.5-325 MG tablet Take 1 tablet by mouth every 4 (four) hours as needed for severe pain. Do not take with cough medication or other pain medication (Patient not taking: Reported on 03/18/2022) 60 tablet 0   No current facility-administered medications for this visit.   Facility-Administered Medications Ordered in Other Visits  Medication Dose Route Frequency Provider Last Rate Last Admin   heparin lock flush 100 UNIT/ML injection              PHYSICAL EXAMINATION:  Vitals:   03/18/22 1031  BP: 105/69  Pulse: 94  Resp: 18  Temp: (!) 96.1 F (35.6 C)  SpO2: 100%   Filed Weights   03/18/22 1031  Weight: 146 lb 3.2 oz (66.3 kg)    Physical Exam Constitutional:      General: He is not in acute distress.    Appearance: He is ill-appearing.  HENT:     Head: Normocephalic and atraumatic.  Eyes:     General: No scleral icterus. Cardiovascular:     Rate and Rhythm: Normal rate  and regular rhythm.     Heart sounds: Normal heart sounds.  Pulmonary:     Effort: Pulmonary effort is normal. No respiratory distress.     Breath sounds: No wheezing.     Comments: Decreased breath sound bilaterally. Abdominal:     General: Bowel sounds are normal. There is no distension.     Palpations: Abdomen is soft.  Musculoskeletal:        General: No deformity. Normal range of motion.     Cervical back: Normal range of motion and neck supple.     Comments: Bilateral ankle swelling, right >left.   Skin:    General: Skin is warm and dry.     Findings: No erythema or rash.  Neurological:     Mental Status: He is alert and oriented to person, place, and time. Mental status is at baseline.     Cranial Nerves: No cranial nerve deficit.     Coordination: Coordination normal.  Psychiatric:        Mood and Affect: Mood normal.     LABORATORY DATA:  I have reviewed the data as listed    Latest Ref Rng & Units 03/18/2022   10:08 AM 02/25/2022    8:00 AM 02/17/2022    2:49 PM  CBC  WBC 4.0 - 10.5 K/uL 44.7  28.1  8.4   Hemoglobin 13.0 - 17.0 g/dL 7.7  9.2  8.5   Hematocrit 39.0 - 52.0 % 23.2  26.8  25.4   Platelets 150 - 400 K/uL 444  585  109       Latest Ref Rng & Units 03/18/2022   10:08 AM 02/25/2022    8:00 AM 02/11/2022    2:57 PM  CMP  Glucose 70 - 99 mg/dL 895  871  628   BUN 8 - 23 mg/dL 18  38  15   Creatinine 0.61 - 1.24 mg/dL 4.45  5.76  9.38   Sodium 135 - 145 mmol/L 129  129  138   Potassium 3.5 - 5.1 mmol/L 3.3  3.7  4.8   Chloride 98 - 111 mmol/L 93  96  102   CO2 22 - 32 mmol/L 22  25  24    Calcium 8.9 - 10.3 mg/dL 8.0  9.2  8.5   Total Protein 6.5 - 8.1 g/dL 6.4  6.8  6.2   Total Bilirubin 0.3 - 1.2 mg/dL 0.2  0.4  0.5   Alkaline Phos 38 - 126 U/L 166  116    AST 15 - 41 U/L 50  27  73   ALT 0 - 44 U/L 36  20  57      Iron/TIBC/Ferritin/ %Sat    Component Value Date/Time   IRON 60 02/03/2022 0958   TIBC 391 02/03/2022 0958   FERRITIN 704  (H) 02/03/2022 0958   IRONPCTSAT 15 (L) 02/03/2022 14/06/2021      RADIOGRAPHIC STUDIES: I have personally reviewed the radiological images as listed and agreed with the findings in the report. No results found.

## 2022-03-18 NOTE — Telephone Encounter (Signed)
I reached out to the patient regarding his MRI and need for sedation. Per pt and pt's wife, pt requires complete IV sedation before his MRI. He has used ativan and other medications in the past and he was not able to tolerate the MRI without having IV anesthesia. Pt and wife aware that we will get back with them on the apt times.  Josh- you will need to reenter order for MRI- requiring IV sedation.

## 2022-03-18 NOTE — Assessment & Plan Note (Signed)
MRI brain shows persistent microbleeds.   Off Eliquis 2.5mg  BID.

## 2022-03-18 NOTE — Progress Notes (Addendum)
Palliative Medicine [facility] at [facility] [phone] [phone]   Name: Micheal Donovan Date: 03/18/2022 MRN: [id number]  DOB: 1950/08/10  Patient Care Team: Donita Brooks, MD as PCP - General (Family Medicine) Glory Buff, RN as Oncology Nurse Navigator    REASON FOR CONSULTATION: [name] is a 72 y.o. male with multiple medical problems including non-small cell lung cancer, history of subacute intraparenchymal hemorrhage, PE.  Patient is status post XRT.  He is on systemic chemo.  He is referred to palliative care to address goals and manage ongoing symptoms.  SOCIAL HISTORY:     reports that he quit smoking about 31 years ago. His smoking use included cigarettes. He has never used smokeless tobacco. He reports that he does not drink alcohol and does not use drugs.  Patient is married.  He has a daughter who is his primary caregiver.  Patient previously worked as an Lexicographer  ADVANCE DIRECTIVES:  On file  CODE STATUS:   PAST MEDICAL HISTORY: Past Medical History:  Diagnosis Date   Allergy    BPH (benign prostatic hypertrophy)    Cancer (HCC)    Melanoma on Neck    2008   Carotid artery occlusion    Diabetes mellitus    type 2   ED (erectile dysfunction)    GERD (gastroesophageal reflux disease)    Hemorrhagic stroke (HCC) 06/2021   Hyperlipidemia    Hypertension    Lung cancer (HCC)    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)     PAST SURGICAL HISTORY:  Past Surgical History:  Procedure Laterality Date   BRONCHIAL NEEDLE ASPIRATION BIOPSY  06/17/2021   Procedure: BRONCHIAL NEEDLE ASPIRATION BIOPSIES;  Surgeon: Lorin Glass, MD;  Location: [facility] ENDOSCOPY;  Service: Pulmonary;;   IR IMAGING GUIDED PORT INSERTION  08/05/2021   MELANOMA EXCISION  2008   Left side of neck   RADIOLOGY WITH ANESTHESIA N/A 04/05/2020   Procedure: MRI SPINE WITOUT CONTRAST;  Surgeon: Radiologist, Medication, MD;   Location: [facility] OR;  Service: Radiology;  Laterality: N/A;   RADIOLOGY WITH ANESTHESIA N/A 08/22/2021   Procedure: MRI BRAIN WITH AND WITHOUT CONTRAST  WITH ANESTHESIA;  Surgeon: Radiologist, Medication, MD;  Location: [facility] OR;  Service: Radiology;  Laterality: N/A;   RADIOLOGY WITH ANESTHESIA N/A 12/17/2021   Procedure: MRI BRAIN WITH AND WITHOUT CONTRAST WITH ANESTHESIA; MRI LUMBER WITH AND WITHOUT CONTRAST;  Surgeon: Radiologist, Medication, MD;  Location: [facility] OR;  Service: Radiology;  Laterality: N/A;   TONSILLECTOMY     VIDEO BRONCHOSCOPY WITH ENDOBRONCHIAL ULTRASOUND N/A 06/17/2021   Procedure: VIDEO BRONCHOSCOPY WITH ENDOBRONCHIAL ULTRASOUND;  Surgeon: Lorin Glass, MD;  Location: [facility] ENDOSCOPY;  Service: Pulmonary;  Laterality: N/A;    HEMATOLOGY/ONCOLOGY HISTORY:  Oncology History Overview Note  Diagnosis: Stage IIB T2b N1 M0 adenocarcinoma of the LUL, poorly differentiated     Primary non-small cell carcinoma of upper lobe of left lung (HCC)  06/13/2021 Initial Diagnosis   Primary non-small cell carcinoma of upper lobe of left lung [facility]) -06/20/2021 - 06/27/2021, patient presented to [facility] due to progressive headache/dizziness/gait changes. CT head showed acute to subacute intraparenchymal hemorrhage involving the left cerebellum.  Surrounding low-density vasogenic edema.  Patient was transferred to [facility]. This was further evaluated by CT angiogram of the neck which showed bulky calcified plaques/stenosis of carotid artery, Followed by MRI brain. 06/12/2021, MRI of the brain showed no significant interval change in size  of the left cerebellar intraparenchymal hematoma with unchanged regional mass effect and partial effacement of fourth ventricle but no upstream hydrocephalus. There is no discernible enhancement to suggest underlying mass lesion, though acute blood products could mask enhancement.   06/12/2021 a chest x-ray showed a 4.4 cm left middle lobe. 06/13/2021, CT  chest with contrast showed a 4.3 x 3.6 x 3.3 cm lobular spiculated mass in the posterior left upper lobe with T3 to the lateral pleura and major fissure.  Metastatic left hilar lymphadenopathy.  Peripheral micronodularity posterior right costophrenic sulcus.  Aortic atherosclerosis. 06/14/2021, CT abdomen pelvis showed right lower lobe pulmonary artery embolus.  No evidence of right heart strain.  No acute intra-abdominal or pelvic pathology.  Aortic atherosclerosis.  06/13/2021, patient underwent bronchoscopy with EBUS by Dr. Katrinka Blazing.  Biopsy from the fine-needle aspiration of station 11 mL showed malignant cells, consistent with poorly differentiated non-small cell carcinoma, consistent with adenocarcinoma.  Malignant cells are TTF-1 positive and negative for p40.  Negative for neuroendocrine markers.  Blood test and tissue sample were tested for Gardant 360  -PD-L1 TPS 97%, no actionable mutation on the blood testing. 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   07/17/2021 Cancer Staging   Staging form: Lung, AJCC 8th Edition - Clinical: Stage IV (cT2b, cN1, cM1) - Signed by [name], MD on 08/06/2021   08/06/2021 Imaging   I saw patient's PET scan after his visit with me on 08/06/21. PET scan was ordered by his previous oncologist group and did not come to my in basket.. Patient received cycle 1 carboplatin Taxol today.  He has started on radiation.  5/26/3 PET scan showed 3.8 cm hypermetabolic left upper lobe mass with hypermetabolic left hilar and infrahilar adenopathy.  There is approximately 8 scattered metastatic lesions in the skeleton. Mixed density photopenic lesion in the left cerebellum. characterized as hemorrhage on recent prior imaging workups.    08/06/2021 - 08/06/2021 Chemotherapy   Patient is on Treatment Plan : LUNG Carboplatin / Paclitaxel + XRT q7d     08/16/2021 -  Chemotherapy   Switched to systemic chemotherapy with  Carboplatin (4.5) + Pemetrexed (400) + Pembrolizumab  (200) D1 q21d Induction x 4 cycles      08/16/2021 -  Chemotherapy   Patient is on Treatment Plan : LUNG Carboplatin (5) + Pemetrexed + Pembrolizumab (200) D1 q21d Induction x 4 cycles / Maintenance Pemetrexed + Pembrolizumab (200) D1 q21d     08/22/2021 Imaging   MRI brain w wo contrast  Decrease in size of left cerebellar hematoma with resolution of edema. No evidence of underlying lesion. Smaller, more recent hemorrhage in the left cerebellar vermis with minimal edema. There is minimal enhancement without definite evidence of underlying lesion.  New punctate focus of chronic blood products and enhancement in the left frontal lobe. Additional new foci of chronic blood products in the posterior right putamen, right parietal subcortical white matter, and posterior right cerebellum. Unclear at this time if these represent foci of bland hemorrhage or early metastases.   Increase in size of right parietal osseous metastasis with minor extraosseous extension.    10/22/2021 - 10/22/2021 Chemotherapy   Patient is on Treatment Plan : LUNG Carboplatin (5) + Pemetrexed (500) + Pembrolizumab (200) D1 q21d Induction x 4 cycles / Maintenance Pemetrexed (500) + Pembrolizumab (200) D1 q21d      Imaging   PET scan showed 1. Interval response to therapy as evidenced by a small residual left upper lobe nodule with decreased  hypermetabolism, no residual hypermetabolic adenopathy and decreased hypermetabolism associatedwith osseous metastases. 2. 6 mm posterior left upper lobe nodule, likely stable. Recommend attention on follow-up. 3. Aortic atherosclerosis (ICD10-I70.0). Coronary artery calcification.   12/17/2021 Imaging   MRI lumbar spine w wo contrast  1. No evidence of regional metastatic disease. 2. Late subacute superior endplate fracture at L2 and large superior endplate Schmorl's node at L5, unchanged since the PET scan of 10/29/2021. 3. L3-4: Shallow disc protrusion. Facet and ligamentous  hypertrophy. Stenosis of both lateral recesses. Findings slightly worsened since 2022. 4. L4-5: Shallow disc protrusion. Facet and ligamentous hypertrophy. Small synovial cyst arising from the facet joint on the right. Stenosis of the lateral recesses right worse than left. Foraminal narrowing right worse than left. Findings have worsened since 2022.  5. L5-S1: Endplate osteophytes and shallow protrusion of the disc. Facet and ligamentous hypertrophy. Stenosis of the subarticular lateral recesses and neural foramina, right worse than left. Similar appearance to the study of 2022. 6. Continued evidence of enteritis of at least 1 loop of small bowel. Distended bladder present   12/18/2021 Imaging   Brian MRI w wo  1. Interval decrease in size of the left cerebellar and vermian hematomas. No definite evidence of underlying metastatic lesion. 2. There is are two possible contrast enhancing lesions in the posterior left frontal lobe and left occipital lobe, which do not have intrinsic T1 signal abnormality, but have a somewhat linear appearance and may be vascular in nature. Recommend attention on follow up. 3. Multifocal sites of susceptibility artifact are redemonstrated with interval development of a few new foci, as described above. All of these sites demonstrate intrinsic T1 hyperintense signal abnormality and susceptibility artifact and are compatible with sites of microhemorrhages. Recommend continued attention on follow up. 4. Interval decrease in size of right parietal calvarial metastatic lesion. No new contrast enhancing lesions visualized. 5. Diffusely heterogeneous marrow signal throughout the cervical spine, which is nonspecific but can be seen in the setting of anemia, smoking, obesity, or a marrow replacement process.     12/18/2021 Imaging   MRI lumbar spine w wo contrast  1. No evidence of regional metastatic disease. 2. Late subacute superior endplate fracture at L2 and large superior  endplate Schmorl's node at L5, unchanged since the PET scan of 10/29/2021 3. L3-4: Shallow disc protrusion. Facet and ligamentous hypertrophy.Stenosis of both lateral recesses. Findings slightly worsened since 2022. 4. L4-5: Shallow disc protrusion. Facet and ligamentous hypertrophy. Small synovial cyst arising from the facet joint on the right. Stenosis of the lateral recesses right worse than left. Foraminal narrowing right worse than left. Findings have worsened since 2022. 5. L5-S1: Endplate osteophytes and shallow protrusion of the disc. Facet and ligamentous hypertrophy. Stenosis of the subarticular lateral recesses and neural foramina, right worse than left. Similar appearance to the study of 2022. 6. Continued evidence of enteritis of at least 1 loop of small bowel. Distended bladder present.       01/28/2022 Imaging   CT chest abdomen pelvis w contrast 1. Treated left upper lobe mass appears grossly stable to the prior examination when measured in a similar fashion on the prior study. No definite signs of extra skeletal metastatic disease noted in the chest, abdomen or pelvis. 2. Widespread skeletal metastases redemonstrated, as above. 3. Hepatic steatosis. 4. Aortic atherosclerosis, in addition to left main and three-vessel coronary artery disease. Assessment for potential risk factor modification, dietary therapy or pharmacologic therapy may be warranted, if clinically indicated. 5. Additional  incidental findings,      ALLERGIES:  is allergic to niaspan [niacin].  MEDICATIONS:  Current Outpatient Medications  Medication Sig Dispense Refill   aspirin EC 81 MG tablet Take 1 tablet (81 mg total) by mouth daily. Swallow whole. 30 tablet 12   atorvastatin (LIPITOR) 40 MG tablet Take 1 tablet (40 mg total) by mouth daily. 60 tablet 2   Ca Phosphate-Cholecalciferol (CALCIUM WITH D3 PO) Take 1 capsule by mouth daily.     cyclobenzaprine (FLEXERIL) 5 MG tablet Take 1 tablet (5 mg  total) by mouth 2 (two) times daily as needed for muscle spasms. 60 tablet 1   dexamethasone (DECADRON) 4 MG tablet Take 1 tab every 12 hours the day before pemetrexed chemo, then take 2 tabs once a day for 3 days starting the day after carboplatin. 30 tablet 1   fentaNYL (DURAGESIC) 50 MCG/HR Place 1 patch onto the skin every 3 (three) days. 10 patch 0   furosemide (LASIX) 20 MG tablet Take 1 tablet (20 mg total) by mouth daily as needed (leg swelling or sob). 30 tablet 3   HYDROcodone bit-homatropine (HYCODAN) 5-1.5 MG/5ML syrup Take 5 mLs by mouth every 8 (eight) hours as needed for cough. (Patient not taking: Reported on 02/25/2022) 120 mL 0   LORazepam (ATIVAN) 1 MG tablet Take 1 tablet (1 mg total) by mouth at bedtime. 30 tablet 2   NOVOLOG FLEXPEN 100 UNIT/ML FlexPen Inject 0-10 Units into the skin daily as needed for high blood sugar (above 200). Sliding scale     omeprazole (PRILOSEC) 20 MG capsule Take 20 mg by mouth daily.     oxyCODONE-acetaminophen (PERCOCET) 7.5-325 MG tablet Take 1 tablet by mouth every 4 (four) hours as needed for severe pain. Do not take with cough medication or other pain medication (Patient not taking: Reported on 03/18/2022) 60 tablet 0   tamsulosin (FLOMAX) 0.4 MG CAPS capsule TAKE 1 CAPSULE(0.4 MG) BY MOUTH DAILY 90 capsule 0   triamcinolone ointment (KENALOG) 0.5 % Apply 1 Application topically 2 (two) times daily. 30 g 0   UNABLE TO FIND PLEASE CHECK FASTING BLOOD GLUCOSE EVERY MORNING 1 each 0   valsartan (DIOVAN) 80 MG tablet Take 1 tablet (80 mg total) by mouth daily. 90 tablet 3   No current facility-administered medications for this visit.   Facility-Administered Medications Ordered in Other Visits  Medication Dose Route Frequency Provider Last Rate Last Admin   heparin lock flush 100 UNIT/ML injection             VITAL SIGNS: There were no vitals taken for this visit. There were no vitals filed for this visit.   Estimated body mass index is 22.9  kg/m as calculated from the following:   Height as of 03/07/22: 5\' 7"  (1.702 m).   Weight as of an earlier encounter on 03/18/22: 146 lb 3.2 oz (66.3 kg).  LABS: CBC:    Component Value Date/Time   WBC 44.7 (H) 03/18/2022 1008   HGB 7.7 (L) 03/18/2022 1008   HGB 13.4 07/17/2021 1345   HCT 23.2 (L) 03/18/2022 1008   PLT 444 (H) 03/18/2022 1008   PLT 373 07/17/2021 1345   MCV 107.4 (H) 03/18/2022 1008   NEUTROABS 40.7 (H) 03/18/2022 1008   LYMPHSABS 0.9 03/18/2022 1008   MONOABS 1.1 (H) 03/18/2022 1008   EOSABS 0.0 03/18/2022 1008   BASOSABS 0.2 (H) 03/18/2022 1008   Comprehensive Metabolic Panel:    Component Value Date/Time   NA 129 (  L) 03/18/2022 1008   K 3.3 (L) 03/18/2022 1008   CL 93 (L) 03/18/2022 1008   CO2 22 03/18/2022 1008   BUN 18 03/18/2022 1008   CREATININE 1.85 (H) 03/18/2022 1008   CREATININE 1.07 02/11/2022 1457   GLUCOSE 285 (H) 03/18/2022 1008   CALCIUM 8.0 (L) 03/18/2022 1008   AST 50 (H) 03/18/2022 1008   AST 18 07/17/2021 1345   ALT 36 03/18/2022 1008   ALT 20 07/17/2021 1345   ALKPHOS 166 (H) 03/18/2022 1008   BILITOT 0.2 (L) 03/18/2022 1008   BILITOT 0.6 07/17/2021 1345   PROT 6.4 (L) 03/18/2022 1008   ALBUMIN 2.9 (L) 03/18/2022 1008    RADIOGRAPHIC STUDIES: No results found.  PERFORMANCE STATUS (ECOG) : 2 - Symptomatic, <50% confined to bed  Review of Systems Unless otherwise noted, a complete review of systems is negative.  Physical Exam General: NAD Pulmonary: Unlabored Extremities: no edema, no joint deformities Skin: no rashes Neurological: Weakness but otherwise nonfocal  IMPRESSION: Patient was an add-on to my clinic schedule today at Dr. Bethanne Ginger request.  Patient has had worsening low back pain at site of known L2 compression fracture.  He has some weakness but denies radiculopathy, numbness or tingling.  No recent falls or trauma.  PCP has been dose escalating pain meds.  Patient was recently started on transdermal fentanyl 25  mcg  and this was increased to 50 mcg every 72 hours 3 days ago.  Additionally, patient has Percocet, which he takes 1-2 times daily.    Discussed pain regimen in detail with patient.  As patient was recently dose escalated on fentanyl, I would hesitate to make any additional changes to his long-acting regimen at this time.  I did encourage patient to liberalize dosing of Percocet as needed.  Will obtain MRI lumbar spine for further evaluation of pain/compression fracture.  Consider referral to IR for kyphoplasty and radiofrequency ablation.  Additionally, patient endorses insomnia.  His PCP recently started him on lorazepam but patient has not found that to be effective.  Discussed importance of sleep hygiene and limiting daytime napping.  Will start patient on trazodone.  PLAN: -Continue current scope of treatment -MRI lumbar spine -Continue fentanyl/Percocet -Start PRN trazodone -Consider referral to IR for kyphoplasty/RFA -Follow-up telephone visit 1 to 2 weeks  Addendum: Patient requires anesthesia for MRI.  First available would be March.  Discussed with IR and will obtain plain radiographs and noncontrasted CT of the lumbar spine in interim to evaluate worsening back pain.  Patient expressed understanding and was in agreement with this plan. He also understands that He can call the clinic at any time with any questions, concerns, or complaints.   Time Total: 25 minutes  Visit consisted of counseling and education dealing with the complex and emotionally intense issues of symptom management and palliative care in the setting of serious and potentially life-threatening illness.Greater than 50%  of this time was spent counseling and coordinating care related to the above assessment and plan.  Signed by: Laurette Schimke, [name], NP-C

## 2022-03-18 NOTE — Assessment & Plan Note (Addendum)
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  PET scan images were reviewed with patient.- good partial response.  Labs are reviewed and discussed with patient. Hold  maintenance Alimta/Keytruda today. -dose reduce Alimta 350mg /m2 Kidney function is worse today-likely due to overdiuresis.  I will hold off repeating CT with contrast right away until hopefully his kidney function improved

## 2022-03-18 NOTE — Assessment & Plan Note (Signed)
Symptomatic anemia, recommend 1 unit of PRBC transfusion

## 2022-03-18 NOTE — Assessment & Plan Note (Addendum)
Back pain, MRI lumbar showed compression fracture Continue PRN percocet, recently added fentanyl patch recommend Flexeril  I discussed with palliative care service Micheal Donovan who will see him today as. Well. I agree with repeating MRI lumbar, possible IR eval for kyphoplasty

## 2022-03-18 NOTE — Progress Notes (Signed)
Patient here for follow up. Reports that he had a fall this morning. Pt was started on Fentany l patches by Dr. Tanya Nones

## 2022-03-19 ENCOUNTER — Encounter: Payer: Self-pay | Admitting: Oncology

## 2022-03-19 ENCOUNTER — Other Ambulatory Visit: Payer: Self-pay

## 2022-03-19 ENCOUNTER — Inpatient Hospital Stay: Payer: PPO | Admitting: Hospice and Palliative Medicine

## 2022-03-19 DIAGNOSIS — D649 Anemia, unspecified: Secondary | ICD-10-CM

## 2022-03-19 LAB — VITAMIN B12: Vitamin B-12: 3412 pg/mL — ABNORMAL HIGH (ref 180–914)

## 2022-03-19 MED ORDER — TRAZODONE HCL 50 MG PO TABS: 50.0000 mg | ORAL_TABLET | Freq: Every evening | ORAL | 0 refills | Status: DC | PRN

## 2022-03-19 NOTE — Addendum Note (Signed)
Addended by: [name] on: 03/19/2022 08:54 AM   Modules accepted: Orders

## 2022-03-19 NOTE — Telephone Encounter (Signed)
K, will you please check availability of MRI lumbar w sedation?

## 2022-03-19 NOTE — Addendum Note (Signed)
Addended by: Laurette Schimke R on: 03/19/2022 10:26 AM   Modules accepted: Orders

## 2022-03-20 ENCOUNTER — Inpatient Hospital Stay: Payer: PPO

## 2022-03-20 ENCOUNTER — Ambulatory Visit
Admission: RE | Admit: 2022-03-20 | Discharge: 2022-03-20 | Disposition: A | Discharge status: Home / Self Care | Payer: PPO | Source: Home / Self Care | Attending: *Deleted | Admitting: *Deleted

## 2022-03-20 ENCOUNTER — Encounter: Payer: Self-pay | Admitting: Oncology

## 2022-03-20 ENCOUNTER — Ambulatory Visit
Admission: RE | Admit: 2022-03-20 | Discharge: 2022-03-20 | Disposition: A | Discharge status: Home / Self Care | Payer: PPO | Source: Ambulatory Visit | Attending: Hospice and Palliative Medicine | Admitting: Hospice and Palliative Medicine

## 2022-03-20 DIAGNOSIS — C3412 Malignant neoplasm of upper lobe, left bronchus or lung: Secondary | ICD-10-CM

## 2022-03-20 DIAGNOSIS — D649 Anemia, unspecified: Secondary | ICD-10-CM

## 2022-03-20 DIAGNOSIS — M545 Low back pain, unspecified: Secondary | ICD-10-CM | POA: Diagnosis not present

## 2022-03-20 MED ORDER — SODIUM CHLORIDE 0.9% FLUSH
10.0000 mL | INTRAVENOUS | Status: AC | PRN
  Administered 2022-03-20: 10 mL
  Filled 2022-03-20: qty 10

## 2022-03-20 MED ORDER — ACETAMINOPHEN 325 MG PO TABS
650.0000 mg | ORAL_TABLET | Freq: Once | ORAL | Status: AC
  Administered 2022-03-20: 650 mg via ORAL
  Filled 2022-03-20: qty 2

## 2022-03-20 MED ORDER — HEPARIN SOD (PORK) LOCK FLUSH 100 UNIT/ML IV SOLN
250.0000 [IU] | INTRAVENOUS | Status: AC | PRN
  Administered 2022-03-20: 250 [IU]
  Filled 2022-03-20: qty 5

## 2022-03-20 MED ORDER — DIPHENHYDRAMINE HCL 25 MG PO CAPS
25.0000 mg | ORAL_CAPSULE | Freq: Once | ORAL | Status: AC
  Administered 2022-03-20: 25 mg via ORAL
  Filled 2022-03-20: qty 1

## 2022-03-20 MED ORDER — SODIUM CHLORIDE 0.9% IV SOLUTION
250.0000 mL | Freq: Once | INTRAVENOUS | Status: AC
  Administered 2022-03-20: 250 mL via INTRAVENOUS
  Filled 2022-03-20: qty 250

## 2022-03-20 NOTE — Patient Instructions (Signed)

## 2022-03-20 NOTE — Addendum Note (Signed)
Addended by: [name] on: 03/20/2022 01:13 PM   Modules accepted: Orders

## 2022-03-20 NOTE — Telephone Encounter (Signed)
Per [place] from [facility]: : "Initially they could not accommodate the MRI until March so I spoke with IR who recommend XR and noncontrast CT first and then could get the MRI later if needed. They have since moved the MRI to Feb"

## 2022-03-21 LAB — TYPE AND SCREEN
ABO/RH(D): O POS
Antibody Screen: NEGATIVE
Unit division: 0

## 2022-03-21 LAB — BPAM RBC
Blood Product Expiration Date: 202402072359
ISSUE DATE / TIME: 202401181329
Unit Type and Rh: 5100

## 2022-03-25 ENCOUNTER — Ambulatory Visit: Payer: PPO | Attending: Internal Medicine | Admitting: Internal Medicine

## 2022-03-25 ENCOUNTER — Inpatient Hospital Stay (HOSPITAL_BASED_OUTPATIENT_CLINIC_OR_DEPARTMENT_OTHER): Payer: PPO | Admitting: Nurse Practitioner

## 2022-03-25 ENCOUNTER — Inpatient Hospital Stay: Payer: PPO | Admitting: Nurse Practitioner

## 2022-03-25 ENCOUNTER — Inpatient Hospital Stay: Payer: PPO

## 2022-03-25 ENCOUNTER — Encounter: Payer: Self-pay | Admitting: Internal Medicine

## 2022-03-25 ENCOUNTER — Encounter: Payer: Self-pay | Admitting: Nurse Practitioner

## 2022-03-25 VITALS — BP 75/54 | HR 96 | Temp 97.6°F | Wt 147.0 lb

## 2022-03-25 VITALS — BP 100/68 | HR 91 | Ht 67.0 in | Wt 147.4 lb

## 2022-03-25 DIAGNOSIS — C3412 Malignant neoplasm of upper lobe, left bronchus or lung: Secondary | ICD-10-CM | POA: Diagnosis not present

## 2022-03-25 DIAGNOSIS — G893 Neoplasm related pain (acute) (chronic): Secondary | ICD-10-CM

## 2022-03-25 DIAGNOSIS — D649 Anemia, unspecified: Secondary | ICD-10-CM

## 2022-03-25 DIAGNOSIS — Z09 Encounter for follow-up examination after completed treatment for conditions other than malignant neoplasm: Secondary | ICD-10-CM | POA: Diagnosis not present

## 2022-03-25 DIAGNOSIS — I2510 Atherosclerotic heart disease of native coronary artery without angina pectoris: Secondary | ICD-10-CM | POA: Diagnosis not present

## 2022-03-25 DIAGNOSIS — R7989 Other specified abnormal findings of blood chemistry: Secondary | ICD-10-CM

## 2022-03-25 DIAGNOSIS — Z95828 Presence of other vascular implants and grafts: Secondary | ICD-10-CM

## 2022-03-25 DIAGNOSIS — M7989 Other specified soft tissue disorders: Secondary | ICD-10-CM

## 2022-03-25 DIAGNOSIS — C7951 Secondary malignant neoplasm of bone: Secondary | ICD-10-CM | POA: Diagnosis not present

## 2022-03-25 LAB — CBC WITH DIFFERENTIAL/PLATELET
Abs Immature Granulocytes: 0.4 10*3/uL — ABNORMAL HIGH (ref 0.00–0.07)
Basophils Absolute: 0.1 10*3/uL (ref 0.0–0.1)
Basophils Relative: 0 %
Eosinophils Absolute: 0 10*3/uL (ref 0.0–0.5)
Eosinophils Relative: 0 %
HCT: 28.1 % — ABNORMAL LOW (ref 39.0–52.0)
Hemoglobin: 9.6 g/dL — ABNORMAL LOW (ref 13.0–17.0)
Immature Granulocytes: 2 %
Lymphocytes Relative: 4 %
Lymphs Abs: 0.9 10*3/uL (ref 0.7–4.0)
MCH: 35.3 pg — ABNORMAL HIGH (ref 26.0–34.0)
MCHC: 34.2 g/dL (ref 30.0–36.0)
MCV: 103.3 fL — ABNORMAL HIGH (ref 80.0–100.0)
Monocytes Absolute: 1.3 10*3/uL — ABNORMAL HIGH (ref 0.1–1.0)
Monocytes Relative: 6 %
Neutro Abs: 18.8 10*3/uL — ABNORMAL HIGH (ref 1.7–7.7)
Neutrophils Relative %: 88 %
Platelets: 360 10*3/uL (ref 150–400)
RBC: 2.72 MIL/uL — ABNORMAL LOW (ref 4.22–5.81)
RDW: 19.7 % — ABNORMAL HIGH (ref 11.5–15.5)
WBC: 21.5 10*3/uL — ABNORMAL HIGH (ref 4.0–10.5)
nRBC: 0 % (ref 0.0–0.2)

## 2022-03-25 LAB — COMPREHENSIVE METABOLIC PANEL
ALT: 28 U/L (ref 0–44)
AST: 42 U/L — ABNORMAL HIGH (ref 15–41)
Albumin: 3 g/dL — ABNORMAL LOW (ref 3.5–5.0)
Alkaline Phosphatase: 135 U/L — ABNORMAL HIGH (ref 38–126)
Anion gap: 12 (ref 5–15)
BUN: 14 mg/dL (ref 8–23)
CO2: 23 mmol/L (ref 22–32)
Calcium: 9 mg/dL (ref 8.9–10.3)
Chloride: 96 mmol/L — ABNORMAL LOW (ref 98–111)
Creatinine, Ser: 1.47 mg/dL — ABNORMAL HIGH (ref 0.61–1.24)
GFR, Estimated: 51 mL/min — ABNORMAL LOW (ref >60)
Glucose, Bld: 167 mg/dL — ABNORMAL HIGH (ref 70–99)
Potassium: 3.3 mmol/L — ABNORMAL LOW (ref 3.5–5.1)
Sodium: 131 mmol/L — ABNORMAL LOW (ref 135–145)
Total Bilirubin: 0.3 mg/dL (ref 0.3–1.2)
Total Protein: 6.4 g/dL — ABNORMAL LOW (ref 6.5–8.1)

## 2022-03-25 MED ORDER — HEPARIN SOD (PORK) LOCK FLUSH 100 UNIT/ML IV SOLN
500.0000 [IU] | Freq: Once | INTRAVENOUS | Status: DC
  Filled 2022-03-25: qty 5

## 2022-03-25 MED ORDER — SODIUM CHLORIDE 0.9% FLUSH
10.0000 mL | Freq: Once | INTRAVENOUS | Status: DC
  Filled 2022-03-25: qty 10

## 2022-03-25 NOTE — Progress Notes (Signed)
Cardiology Office Note:    Date:  03/25/2022   ID:  Micheal Donovan, DOB 25-Mar-1950, MRN [id number]  PCP:  Donita Brooks, MD   [facility] Providers Cardiologist:  None     Referring MD: Donita Brooks, MD   No chief complaint on file. Cardiotoxic chemo  History of Present Illness:    Micheal Donovan is a 72 y.o. 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 He is being diuresed and his crt bumped. 1.1 02/25/2022 to 1.85 03/18/2022. Recent PET scan shows good partial response with therapy, pending CT; holding off on contrast with AKI. He has hx of PE but brain MRI showed small bleed, so eliquis is held. He is now off the lasix. He is still having some swelling in his feet. They have improved overall. He denies angina, dyspnea on exertion, lower extremity edema, PND or orthopnea. BP 100/68 mmhg. He is taking asa 81 mg daily. No prior heart disease. His Alimta/Keytruda has been held.   Cardiology Studies: 06/12/2021- TTE: EF 55-60%, nl RV,no significant valve dx 06/19/2021-TTE EF 45-50% mildly decreased EF, nl RV fxn, no sig valve dx   Past Medical History:  Diagnosis Date   Allergy    BPH (benign prostatic hypertrophy)    Cancer (HCC)    Melanoma on Neck    2008   Carotid artery occlusion    Diabetes mellitus    type 2   ED (erectile dysfunction)    GERD (gastroesophageal reflux disease)    Hemorrhagic stroke (HCC) 06/2021   Hyperlipidemia    Hypertension    Lung cancer (HCC)    Retinopathy due to secondary DM [facility])     Past Surgical History:  Procedure Laterality Date   BRONCHIAL NEEDLE ASPIRATION BIOPSY  06/17/2021   Procedure: BRONCHIAL NEEDLE ASPIRATION BIOPSIES;  Surgeon: Lorin Glass, MD;  Location: [facility] ENDOSCOPY;  Service: Pulmonary;;   IR IMAGING GUIDED PORT INSERTION  08/05/2021   MELANOMA EXCISION  2008   Left side  of neck   RADIOLOGY WITH ANESTHESIA N/A 04/05/2020   Procedure: MRI SPINE WITOUT CONTRAST;  Surgeon: Radiologist, Medication, MD;  Location: [facility] OR;  Service: Radiology;  Laterality: N/A;   RADIOLOGY WITH ANESTHESIA N/A 08/22/2021   Procedure: MRI BRAIN WITH AND WITHOUT CONTRAST  WITH ANESTHESIA;  Surgeon: Radiologist, Medication, MD;  Location: [facility] OR;  Service: Radiology;  Laterality: N/A;   RADIOLOGY WITH ANESTHESIA N/A 12/17/2021   Procedure: MRI BRAIN WITH AND WITHOUT CONTRAST WITH ANESTHESIA; MRI LUMBER WITH AND WITHOUT CONTRAST;  Surgeon: Radiologist, Medication, MD;  Location: [facility] OR;  Service: Radiology;  Laterality: N/A;   TONSILLECTOMY     VIDEO BRONCHOSCOPY WITH ENDOBRONCHIAL ULTRASOUND N/A 06/17/2021   Procedure: VIDEO BRONCHOSCOPY WITH ENDOBRONCHIAL ULTRASOUND;  Surgeon: Lorin Glass, MD;  Location: [facility] ENDOSCOPY;  Service: Pulmonary;  Laterality: N/A;    Current Medications: No outpatient medications have been marked as taking for the 03/25/22 encounter (Appointment) with Maisie Fus, MD.     Allergies:   Niaspan [niacin]   Social History   Socioeconomic History   Marital status: Married    Spouse name: Not on file   Number of children: Not on file   Years of education: Not on file   Highest education level: Not on file  Occupational History   Not  on file  Tobacco Use   Smoking status: Former    Types: Cigarettes    Quit date: 07/21/1990    Years since quitting: 31.6   Smokeless tobacco: Never  Vaping Use   Vaping Use: Never used  Substance and Sexual Activity   Alcohol use: No   Drug use: No   Sexual activity: Not on file  Other Topics Concern   Not on file  Social History Narrative   Not on file   Social Determinants of Health   Financial Resource Strain: Low Risk  (11/21/2021)   Overall Financial Resource Strain (CARDIA)    Difficulty of Paying Living Expenses: Not hard at all  Food Insecurity: No Food Insecurity (11/19/2021)   Hunger Vital Sign     Worried About Running Out of Food in the Last Year: Never true    Ran Out of Food in the Last Year: Never true  Transportation Needs: No Transportation Needs (11/21/2021)   PRAPARE - Administrator, Civil Service (Medical): No    Lack of Transportation (Non-Medical): No  Physical Activity: Inactive (07/31/2021)   Exercise Vital Sign    Days of Exercise per Week: 0 days    Minutes of Exercise per Session: 0 min  Stress: Unknown (07/31/2021)   [employer] of Occupational Health - Occupational Stress Questionnaire    Feeling of Stress : Patient refused  Social Connections: Unknown (07/31/2021)   Social Connection and Isolation Panel [NHANES]    Frequency of Communication with Friends and Family: Three times a week    Frequency of Social Gatherings with Friends and Family: Three times a week    Attends Religious Services: Patient refused    Active Member of Clubs or Organizations: Patient refused    Attends Banker Meetings: Patient refused    Marital Status: Married     Family History: The patient's family history includes COPD in his mother; Heart disease in his brother and father. There is no history of Stroke.  ROS:   Please see the history of present illness.     All other systems reviewed and are negative.  EKGs/Labs/Other Studies Reviewed:    The following studies were reviewed today:   EKG:  EKG is  ordered today.  The ekg ordered today demonstrates   03/25/2022- NSR, 1st degree AV block, RBBB, LAD  Recent Labs: 11/20/2021: Magnesium 1.8 02/03/2022: TSH 1.438 02/13/2022: B Natriuretic Peptide 153.8 03/18/2022: ALT 36; BUN 18; Creatinine, Ser 1.85; Hemoglobin 7.7; Platelets 444; Potassium 3.3; Sodium 129  Recent Lipid Panel    Component Value Date/Time   CHOL 266 (H) 06/12/2021 0432   TRIG 144 06/12/2021 0432   HDL 24 (L) 06/12/2021 0432   CHOLHDL 11.1 06/12/2021 0432   VLDL 29 06/12/2021 0432   LDLCALC 213 (H) 06/12/2021 0432   LDLCALC 81  04/29/2017 0822     Risk Assessment/Calculations:     Physical Exam:    VS:  Vitals:   03/25/22 1155  BP: 100/68  Pulse: 91  SpO2: 99%     Wt Readings from Last 3 Encounters:  03/18/22 146 lb 3.2 oz (66.3 kg)  03/07/22 141 lb (64 kg)  02/25/22 145 lb 4.8 oz (65.9 kg)     GEN:  Well nourished, well developed in no acute distress HEENT: Normal NECK: No JVD; No carotid bruits LYMPHATICS: No lymphadenopathy CARDIAC: RRR, no murmurs, rubs, gallops RESPIRATORY:  Clear to auscultation without rales, wheezing or rhonchi  ABDOMEN: Soft, non-tender, non-distended MUSCULOSKELETAL:  No edema; No deformity  SKIN: Warm and dry NEUROLOGIC:  Alert and oriented x 3 PSYCHIATRIC:  Normal affect   ASSESSMENT:     Cardiac Dysfunction: He is on an ICI; which is being held. Myocarditis is on the differential for declined EF; however he has 3V CAC. He has declined EF seen in April. The caveat is, if he has ischemic disease; DAPT would be recommended if he required a PCI. He has small brain bleed which would be a contraindication. Continue asa 81 mg daily. Will to repeat his echo.  - euvolemic today - wt 147; was in the 170s - can continue off lasix - BNP today - wont plan to be aggressive with lipids on with metastatic dx and frailty. Likely won't experience the long-term benefit PLAN:    In order of problems listed above:  TTE; help reschedule, can plan GDMT if very low. Otherwise LHC is high risk BNP Follow up 3 months           Medication Adjustments/Labs and Tests Ordered: Current medicines are reviewed at length with the patient today.  Concerns regarding medicines are outlined above.  No orders of the defined types were placed in this encounter.  No orders of the defined types were placed in this encounter.   There are no Patient Instructions on file for this visit.   Signed, Maisie Fus, MD  03/25/2022 11:32 AM    [facility]

## 2022-03-25 NOTE — Progress Notes (Deleted)
Cardiology Office Note:    Date:  03/25/2022   ID:  MADS BORGMEYER, DOB [birth year], MRN [id number]  PCP:  Donita Brooks, MD   [facility] Providers Cardiologist:  None { Click to update primary MD,subspecialty MD or APP then REFRESH:1}    Referring MD: Donita Brooks, MD   No chief complaint on file. ***  History of Present Illness:    Micheal Donovan is a 72 y.o. male with a hx of melanoma 2008, GERD, lung CA ***    Cardiology Studies: 06/12/2021- TTE: EF 55-60%, nl RV,no significant valve dx   Past Medical History:  Diagnosis Date   Allergy    BPH (benign prostatic hypertrophy)    Cancer (HCC)    Melanoma on Neck    2008   Carotid artery occlusion    Diabetes mellitus    type 2   ED (erectile dysfunction)    GERD (gastroesophageal reflux disease)    Hemorrhagic stroke (HCC) 06/2021   Hyperlipidemia    Hypertension    Lung cancer (HCC)    Retinopathy due to secondary DM [facility])     Past Surgical History:  Procedure Laterality Date   BRONCHIAL NEEDLE ASPIRATION BIOPSY  06/17/2021   Procedure: BRONCHIAL NEEDLE ASPIRATION BIOPSIES;  Surgeon: Lorin Glass, MD;  Location: [facility] ENDOSCOPY;  Service: Pulmonary;;   IR IMAGING GUIDED PORT INSERTION  08/05/2021   MELANOMA EXCISION  2008   Left side of neck   RADIOLOGY WITH ANESTHESIA N/A 04/05/2020   Procedure: MRI SPINE WITOUT CONTRAST;  Surgeon: Radiologist, Medication, MD;  Location: [facility] OR;  Service: Radiology;  Laterality: N/A;   RADIOLOGY WITH ANESTHESIA N/A 08/22/2021   Procedure: MRI BRAIN WITH AND WITHOUT CONTRAST  WITH ANESTHESIA;  Surgeon: Radiologist, Medication, MD;  Location: [facility] OR;  Service: Radiology;  Laterality: N/A;   RADIOLOGY WITH ANESTHESIA N/A 12/17/2021   Procedure: MRI BRAIN WITH AND WITHOUT CONTRAST WITH ANESTHESIA; MRI LUMBER WITH AND WITHOUT CONTRAST;  Surgeon: Radiologist, Medication, MD;  Location: [facility] OR;  Service: Radiology;  Laterality: N/A;   TONSILLECTOMY     VIDEO BRONCHOSCOPY  WITH ENDOBRONCHIAL ULTRASOUND N/A 06/17/2021   Procedure: VIDEO BRONCHOSCOPY WITH ENDOBRONCHIAL ULTRASOUND;  Surgeon: Lorin Glass, MD;  Location: [facility] ENDOSCOPY;  Service: Pulmonary;  Laterality: N/A;    Current Medications: No outpatient medications have been marked as taking for the 03/25/22 encounter (Appointment) with Maisie Fus, MD.     Allergies:   Niaspan [niacin]   Social History   Socioeconomic History   Marital status: Married    Spouse name: Not on file   Number of children: Not on file   Years of education: Not on file   Highest education level: Not on file  Occupational History   Not on file  Tobacco Use   Smoking status: Former    Types: Cigarettes    Quit date: 07/21/1990    Years since quitting: 31.6   Smokeless tobacco: Never  Vaping Use   Vaping Use: Never used  Substance and Sexual Activity   Alcohol use: No   Drug use: No   Sexual activity: Not on file  Other Topics Concern   Not on file  Social History Narrative   Not on file   Social Determinants of Health   Financial Resource Strain: Low Risk  (11/21/2021)   Overall Financial Resource Strain (CARDIA)    Difficulty of Paying Living Expenses: Not hard at all  Food Insecurity: No Food Insecurity (11/19/2021)  Hunger Vital Sign    Worried About Running Out of Food in the Last Year: Never true    Ran Out of Food in the Last Year: Never true  Transportation Needs: No Transportation Needs (11/21/2021)   PRAPARE - Administrator, Civil Service (Medical): No    Lack of Transportation (Non-Medical): No  Physical Activity: Inactive (07/31/2021)   Exercise Vital Sign    Days of Exercise per Week: 0 days    Minutes of Exercise per Session: 0 min  Stress: Unknown (07/31/2021)   [employer] of Occupational Health - Occupational Stress Questionnaire    Feeling of Stress : Patient refused  Social Connections: Unknown (07/31/2021)   Social Connection and Isolation Panel [NHANES]     Frequency of Communication with Friends and Family: Three times a week    Frequency of Social Gatherings with Friends and Family: Three times a week    Attends Religious Services: Patient refused    Active Member of Clubs or Organizations: Patient refused    Attends Banker Meetings: Patient refused    Marital Status: Married     Family History: The patient's ***family history includes COPD in his mother; Heart disease in his brother and father. There is no history of Stroke.  ROS:   Please see the history of present illness.    *** All other systems reviewed and are negative.  EKGs/Labs/Other Studies Reviewed:    The following studies were reviewed today: ***  EKG:  EKG is *** ordered today.  The ekg ordered today demonstrates ***  Recent Labs: 11/20/2021: Magnesium 1.8 02/03/2022: TSH 1.438 02/13/2022: B Natriuretic Peptide 153.8 03/18/2022: ALT 36; BUN 18; Creatinine, Ser 1.85; Hemoglobin 7.7; Platelets 444; Potassium 3.3; Sodium 129  Recent Lipid Panel    Component Value Date/Time   CHOL 266 (H) 06/12/2021 0432   TRIG 144 06/12/2021 0432   HDL 24 (L) 06/12/2021 0432   CHOLHDL 11.1 06/12/2021 0432   VLDL 29 06/12/2021 0432   LDLCALC 213 (H) 06/12/2021 0432   LDLCALC 81 04/29/2017 0822     Risk Assessment/Calculations:   {Does this patient have ATRIAL FIBRILLATION?:[phone]}  No BP recorded.  {Refresh Note OR Click here to enter BP  :1}***         Physical Exam:    VS:  There were no vitals taken for this visit.    Wt Readings from Last 3 Encounters:  03/18/22 146 lb 3.2 oz (66.3 kg)  03/07/22 141 lb (64 kg)  02/25/22 145 lb 4.8 oz (65.9 kg)     GEN: *** Well nourished, well developed in no acute distress HEENT: Normal NECK: No JVD; No carotid bruits LYMPHATICS: No lymphadenopathy CARDIAC: ***RRR, no murmurs, rubs, gallops RESPIRATORY:  Clear to auscultation without rales, wheezing or rhonchi  ABDOMEN: Soft, non-tender,  non-distended MUSCULOSKELETAL:  No edema; No deformity  SKIN: Warm and dry NEUROLOGIC:  Alert and oriented x 3 PSYCHIATRIC:  Normal affect   ASSESSMENT:    No diagnosis found. PLAN:    In order of problems listed above:  Lexiscan SPECT BNP      {Are you ordering a CV Procedure (e.g. stress test, cath, DCCV, TEE, etc)?   Press F2        :[id number]}    Medication Adjustments/Labs and Tests Ordered: Current medicines are reviewed at length with the patient today.  Concerns regarding medicines are outlined above.  No orders of the defined types were placed in this encounter.  No orders  of the defined types were placed in this encounter.   There are no Patient Instructions on file for this visit.   Signed, Maisie Fus, MD  03/25/2022 11:32 AM    [place] [facility]

## 2022-03-25 NOTE — Patient Instructions (Signed)
Medication Instructions:  Your physician recommends that you continue on your current medications as directed. Please refer to the Current Medication list given to you today.  *If you need a refill on your cardiac medications before your next appointment, please call your pharmacy*   Lab Work: Your physician recommends that you have the following lab drawn today: BNP  If you have labs (blood work) drawn today and your tests are completely normal, you will receive your results only by: MyChart Message (if you have MyChart) OR A paper copy in the mail If you have any lab test that is abnormal or we need to change your treatment, we will call you to review the results.   Testing/Procedures: NONE   Follow-Up: At [facility], you and your health needs are our priority.  As part of our continuing mission to provide you with exceptional heart care, we have created designated Provider Care Teams.  These Care Teams include your primary Cardiologist (physician) and Advanced Practice Providers (APPs -  Physician Assistants and Nurse Practitioners) who all work together to provide you with the care you need, when you need it.  We recommend signing up for the patient portal called "MyChart".  Sign up information is provided on this After Visit Summary.  MyChart is used to connect with patients for Virtual Visits (Telemedicine).  Patients are able to view lab/test results, encounter notes, upcoming appointments, etc.  Non-urgent messages can be sent to your provider as well.   To learn more about what you can do with MyChart, go to [email or web address].    Your next appointment:   3 month(s)  Provider:   Maisie Fus, MD     Other Instructions Please get this patient rescheduled for his echocardiogram, Preferably in the afternoon.

## 2022-03-25 NOTE — Progress Notes (Signed)
Hematology/Oncology Progress Note [phone] [phone]   Patient Care Team: Donita Brooks, MD as PCP - General (Family Medicine) Wyline Mood Alben Spittle, MD as PCP - Cardiology (Cardiology) Glory Buff, RN as Oncology Nurse Navigator  CHIEF COMPLAINTS/REASON FOR VISIT:  non-small cell lung cancer  HISTORY OF PRESENTING ILLNESS:   Micheal Donovan is a  72 y.o.  male presents for follow up of Non-small cell lung cancer.  Oncology History Overview Note  Diagnosis: Stage IIB T2b N1 M0 adenocarcinoma of the LUL, poorly differentiated     Primary non-small cell carcinoma of upper lobe of left lung (HCC)  06/13/2021 Initial Diagnosis   Primary non-small cell carcinoma of upper lobe of left lung [facility]) -06/20/2021 - 06/27/2021, patient presented to [facility] due to progressive headache/dizziness/gait changes. CT head showed acute to subacute intraparenchymal hemorrhage involving the left cerebellum.  Surrounding low-density vasogenic edema.  Patient was transferred to [facility]. This was further evaluated by CT angiogram of the neck which showed bulky calcified plaques/stenosis of carotid artery, Followed by MRI brain. 06/12/2021, MRI of the brain showed no significant interval change in size of the left cerebellar intraparenchymal hematoma with unchanged regional mass effect and partial effacement of fourth ventricle but no upstream hydrocephalus. There is no discernible enhancement to suggest underlying mass lesion, though acute blood products could mask enhancement.   06/12/2021 a chest x-ray showed a 4.4 cm left middle lobe. 06/13/2021, CT chest with contrast showed a 4.3 x 3.6 x 3.3 cm lobular spiculated mass in the posterior left upper lobe with T3 to the lateral pleura and major fissure.  Metastatic left hilar lymphadenopathy.  Peripheral micronodularity posterior right costophrenic sulcus.  Aortic atherosclerosis. 06/14/2021, CT abdomen pelvis showed right lower  lobe pulmonary artery embolus.  No evidence of right heart strain.  No acute intra-abdominal or pelvic pathology.  Aortic atherosclerosis.  06/13/2021, patient underwent bronchoscopy with EBUS by Dr. Katrinka Blazing.  Biopsy from the fine-needle aspiration of station 11 mL showed malignant cells, consistent with poorly differentiated non-small cell carcinoma, consistent with adenocarcinoma.  Malignant cells are TTF-1 positive and negative for p40.  Negative for neuroendocrine markers.  Blood test and tissue sample were tested for Gardant 360  -PD-L1 TPS 97%, no actionable mutation on the blood testing. 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   07/17/2021 Cancer Staging   Staging form: Lung, AJCC 8th Edition - Clinical: Stage IV (cT2b, cN1, cM1) - Signed by [name], MD on 08/06/2021   08/06/2021 Imaging   I saw patient's PET scan after his visit with me on 08/06/21. PET scan was ordered by his previous oncologist group and did not come to my in basket.. Patient received cycle 1 carboplatin Taxol today.  He has started on radiation.  5/26/3 PET scan showed 3.8 cm hypermetabolic left upper lobe mass with hypermetabolic left hilar and infrahilar adenopathy.  There is approximately 8 scattered metastatic lesions in the skeleton. Mixed density photopenic lesion in the left cerebellum. characterized as hemorrhage on recent prior imaging workups.    08/06/2021 - 08/06/2021 Chemotherapy   Patient is on Treatment Plan : LUNG Carboplatin / Paclitaxel + XRT q7d     08/16/2021 -  Chemotherapy   Switched to systemic chemotherapy with  Carboplatin (4.5) + Pemetrexed (400) + Pembrolizumab (200) D1 q21d Induction x 4 cycles      08/16/2021 -  Chemotherapy   Patient is on Treatment Plan : LUNG Carboplatin (5) + Pemetrexed + Pembrolizumab (200) D1 q21d  Induction x 4 cycles / Maintenance Pemetrexed + Pembrolizumab (200) D1 q21d     08/22/2021 Imaging   MRI brain w wo contrast  Decrease in size of left cerebellar  hematoma with resolution of edema. No evidence of underlying lesion. Smaller, more recent hemorrhage in the left cerebellar vermis with minimal edema. There is minimal enhancement without definite evidence of underlying lesion.  New punctate focus of chronic blood products and enhancement in the left frontal lobe. Additional new foci of chronic blood products in the posterior right putamen, right parietal subcortical white matter, and posterior right cerebellum. Unclear at this time if these represent foci of bland hemorrhage or early metastases.   Increase in size of right parietal osseous metastasis with minor extraosseous extension.    10/22/2021 - 10/22/2021 Chemotherapy   Patient is on Treatment Plan : LUNG Carboplatin (5) + Pemetrexed (500) + Pembrolizumab (200) D1 q21d Induction x 4 cycles / Maintenance Pemetrexed (500) + Pembrolizumab (200) D1 q21d      Imaging   PET scan showed 1. Interval response to therapy as evidenced by a small residual left upper lobe nodule with decreased hypermetabolism, no residual hypermetabolic adenopathy and decreased hypermetabolism associatedwith osseous metastases. 2. 6 mm posterior left upper lobe nodule, likely stable. Recommend attention on follow-up. 3. Aortic atherosclerosis (ICD10-I70.0). Coronary artery calcification.   12/17/2021 Imaging   MRI lumbar spine w wo contrast  1. No evidence of regional metastatic disease. 2. Late subacute superior endplate fracture at L2 and large superior endplate Schmorl's node at L5, unchanged since the PET scan of 10/29/2021. 3. L3-4: Shallow disc protrusion. Facet and ligamentous hypertrophy. Stenosis of both lateral recesses. Findings slightly worsened since 2022. 4. L4-5: Shallow disc protrusion. Facet and ligamentous hypertrophy. Small synovial cyst arising from the facet joint on the right. Stenosis of the lateral recesses right worse than left. Foraminal narrowing right worse than left. Findings have worsened  since 2022.  5. L5-S1: Endplate osteophytes and shallow protrusion of the disc. Facet and ligamentous hypertrophy. Stenosis of the subarticular lateral recesses and neural foramina, right worse than left. Similar appearance to the study of 2022. 6. Continued evidence of enteritis of at least 1 loop of small bowel. Distended bladder present   12/18/2021 Imaging   Brian MRI w wo  1. Interval decrease in size of the left cerebellar and vermian hematomas. No definite evidence of underlying metastatic lesion. 2. There is are two possible contrast enhancing lesions in the posterior left frontal lobe and left occipital lobe, which do not have intrinsic T1 signal abnormality, but have a somewhat linear appearance and may be vascular in nature. Recommend attention on follow up. 3. Multifocal sites of susceptibility artifact are redemonstrated with interval development of a few new foci, as described above. All of these sites demonstrate intrinsic T1 hyperintense signal abnormality and susceptibility artifact and are compatible with sites of microhemorrhages. Recommend continued attention on follow up. 4. Interval decrease in size of right parietal calvarial metastatic lesion. No new contrast enhancing lesions visualized. 5. Diffusely heterogeneous marrow signal throughout the cervical spine, which is nonspecific but can be seen in the setting of anemia, smoking, obesity, or a marrow replacement process.     12/18/2021 Imaging   MRI lumbar spine w wo contrast  1. No evidence of regional metastatic disease. 2. Late subacute superior endplate fracture at L2 and large superior endplate Schmorl's node at L5, unchanged since the PET scan of 10/29/2021 3. L3-4: Shallow disc protrusion. Facet and ligamentous hypertrophy.Stenosis  of both lateral recesses. Findings slightly worsened since 2022. 4. L4-5: Shallow disc protrusion. Facet and ligamentous hypertrophy. Small synovial cyst arising from the facet joint on  the right. Stenosis of the lateral recesses right worse than left. Foraminal narrowing right worse than left. Findings have worsened since 2022. 5. L5-S1: Endplate osteophytes and shallow protrusion of the disc. Facet and ligamentous hypertrophy. Stenosis of the subarticular lateral recesses and neural foramina, right worse than left. Similar appearance to the study of 2022. 6. Continued evidence of enteritis of at least 1 loop of small bowel. Distended bladder present.       01/28/2022 Imaging   CT chest abdomen pelvis w contrast 1. Treated left upper lobe mass appears grossly stable to the prior examination when measured in a similar fashion on the prior study. No definite signs of extra skeletal metastatic disease noted in the chest, abdomen or pelvis. 2. Widespread skeletal metastases redemonstrated, as above. 3. Hepatic steatosis. 4. Aortic atherosclerosis, in addition to left main and three-vessel coronary artery disease. Assessment for potential risk factor modification, dietary therapy or pharmacologic therapy may be warranted, if clinically indicated. 5. Additional incidental findings,    # Patient has a history of melanoma on his neck, treated in 2009. # History of hemorrhagic infarct left cerebellum  INTERVAL HISTORY Micheal Donovan is a 72 y.o. male with above history of stage IV adenocarcinoma of the lung, who has above history reviewed by me today presents for follow up visit for management of  Stage IV lung adenocarcianoma, currently on pembro-alimta, zometa, last treatment 02/25/22. He feels weak. His back pain is most bothersome and prevents him from getting out of bed or ambulating very much. Unable to enjoy his barn, garage, and family d/t pain. Has also noticed crusting of his eyelids over past few days. His exertional dyspnea has improved. Leg swelling has improved. He saw his cardiologist this morning. He prefers to minimize oral pain medication. Had some imaging done  recently and is waiting to see palliative care for follow up and treatment planning. Doesn't sleep well d/t pain.   Review of Systems  Constitutional:  Positive for fatigue. Negative for appetite change, chills, fever and unexpected weight change.  HENT:   Negative for hearing loss and voice change.   Eyes:  Negative for eye problems and icterus.  Respiratory:  Positive for shortness of breath. Negative for chest tightness and cough.   Cardiovascular:  Positive for leg swelling. Negative for chest pain.  Gastrointestinal:  Negative for abdominal distention, abdominal pain and blood in stool.  Endocrine: Negative for hot flashes.  Genitourinary:  Negative for difficulty urinating, dysuria and frequency.   Musculoskeletal:  Positive for back pain. Negative for arthralgias.  Skin:  Negative for itching and rash.  Neurological:  Negative for extremity weakness, headaches, light-headedness and numbness.  Hematological:  Negative for adenopathy. Does not bruise/bleed easily.  Psychiatric/Behavioral:  Positive for sleep disturbance. Negative for confusion.     MEDICAL HISTORY:  Past Medical History:  Diagnosis Date   Allergy    BPH (benign prostatic hypertrophy)    Cancer (HCC)    Melanoma on Neck    2008   Carotid artery occlusion    Diabetes mellitus    type 2   ED (erectile dysfunction)    GERD (gastroesophageal reflux disease)    Hemorrhagic stroke (HCC) 06/2021   Hyperlipidemia    Hypertension    Lung cancer (HCC)    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)  SURGICAL HISTORY: Past Surgical History:  Procedure Laterality Date   BRONCHIAL NEEDLE ASPIRATION BIOPSY  06/17/2021   Procedure: BRONCHIAL NEEDLE ASPIRATION BIOPSIES;  Surgeon: Lorin Glass, MD;  Location: [facility] ENDOSCOPY;  Service: Pulmonary;;   IR IMAGING GUIDED PORT INSERTION  08/05/2021   MELANOMA EXCISION  2008   Left side of neck   RADIOLOGY WITH ANESTHESIA N/A 04/05/2020   Procedure: MRI SPINE WITOUT CONTRAST;   Surgeon: Radiologist, Medication, MD;  Location: [facility] OR;  Service: Radiology;  Laterality: N/A;   RADIOLOGY WITH ANESTHESIA N/A 08/22/2021   Procedure: MRI BRAIN WITH AND WITHOUT CONTRAST  WITH ANESTHESIA;  Surgeon: Radiologist, Medication, MD;  Location: [facility] OR;  Service: Radiology;  Laterality: N/A;   RADIOLOGY WITH ANESTHESIA N/A 12/17/2021   Procedure: MRI BRAIN WITH AND WITHOUT CONTRAST WITH ANESTHESIA; MRI LUMBER WITH AND WITHOUT CONTRAST;  Surgeon: Radiologist, Medication, MD;  Location: [facility] OR;  Service: Radiology;  Laterality: N/A;   TONSILLECTOMY     VIDEO BRONCHOSCOPY WITH ENDOBRONCHIAL ULTRASOUND N/A 06/17/2021   Procedure: VIDEO BRONCHOSCOPY WITH ENDOBRONCHIAL ULTRASOUND;  Surgeon: Lorin Glass, MD;  Location: [facility] ENDOSCOPY;  Service: Pulmonary;  Laterality: N/A;    SOCIAL HISTORY: Social History   Socioeconomic History   Marital status: Married    Spouse name: Not on file   Number of children: Not on file   Years of education: Not on file   Highest education level: Not on file  Occupational History   Not on file  Tobacco Use   Smoking status: Former    Types: Cigarettes    Quit date: 07/21/1990    Years since quitting: 31.6   Smokeless tobacco: Never  Vaping Use   Vaping Use: Never used  Substance and Sexual Activity   Alcohol use: No   Drug use: No   Sexual activity: Not on file  Other Topics Concern   Not on file  Social History Narrative   Not on file   Social Determinants of Health   Financial Resource Strain: Low Risk  (11/21/2021)   Overall Financial Resource Strain (CARDIA)    Difficulty of Paying Living Expenses: Not hard at all  Food Insecurity: No Food Insecurity (11/19/2021)   Hunger Vital Sign    Worried About Running Out of Food in the Last Year: Never true    Ran Out of Food in the Last Year: Never true  Transportation Needs: No Transportation Needs (11/21/2021)   PRAPARE - Administrator, Civil Service (Medical): No    Lack of  Transportation (Non-Medical): No  Physical Activity: Inactive (07/31/2021)   Exercise Vital Sign    Days of Exercise per Week: 0 days    Minutes of Exercise per Session: 0 min  Stress: Unknown (07/31/2021)   [employer] of Occupational Health - Occupational Stress Questionnaire    Feeling of Stress : Patient refused  Social Connections: Unknown (07/31/2021)   Social Connection and Isolation Panel [NHANES]    Frequency of Communication with Friends and Family: Three times a week    Frequency of Social Gatherings with Friends and Family: Three times a week    Attends Religious Services: Patient refused    Active Member of Clubs or Organizations: Patient refused    Attends Banker Meetings: Patient refused    Marital Status: Married  Catering manager Violence: Not At Risk (07/31/2021)   Humiliation, Afraid, Rape, and Kick questionnaire    Fear of Current or Ex-Partner: No    Emotionally Abused: No  Physically Abused: No    Sexually Abused: No    FAMILY HISTORY: Family History  Problem Relation Age of Onset   COPD Mother    Heart disease Father    Heart disease Brother        MI at age 30   Stroke Neg Hx     ALLERGIES:  is allergic to niaspan [niacin].  MEDICATIONS:  Current Outpatient Medications  Medication Sig Dispense Refill   aspirin EC 81 MG tablet Take 1 tablet (81 mg total) by mouth daily. Swallow whole. 30 tablet 12   atorvastatin (LIPITOR) 40 MG tablet Take 1 tablet (40 mg total) by mouth daily. 60 tablet 2   Ca Phosphate-Cholecalciferol (CALCIUM WITH D3 PO) Take 1 capsule by mouth daily.     cyclobenzaprine (FLEXERIL) 5 MG tablet Take 1 tablet (5 mg total) by mouth 2 (two) times daily as needed for muscle spasms. 60 tablet 1   dexamethasone (DECADRON) 4 MG tablet Take 1 tab every 12 hours the day before pemetrexed chemo, then take 2 tabs once a day for 3 days starting the day after carboplatin. 30 tablet 1   fentaNYL (DURAGESIC) 50 MCG/HR Place 1  patch onto the skin every 3 (three) days. 10 patch 0   furosemide (LASIX) 20 MG tablet Take 1 tablet (20 mg total) by mouth daily as needed (leg swelling or sob). 30 tablet 3   HYDROcodone bit-homatropine (HYCODAN) 5-1.5 MG/5ML syrup Take 5 mLs by mouth every 8 (eight) hours as needed for cough. (Patient not taking: Reported on 02/25/2022) 120 mL 0   LORazepam (ATIVAN) 1 MG tablet Take 1 tablet (1 mg total) by mouth at bedtime. (Patient not taking: Reported on 03/25/2022) 30 tablet 2   NOVOLOG FLEXPEN 100 UNIT/ML FlexPen Inject 0-10 Units into the skin daily as needed for high blood sugar (above 200). Sliding scale     omeprazole (PRILOSEC) 20 MG capsule Take 20 mg by mouth daily.     oxyCODONE-acetaminophen (PERCOCET) 7.5-325 MG tablet Take 1 tablet by mouth every 4 (four) hours as needed for severe pain. Do not take with cough medication or other pain medication (Patient not taking: Reported on 03/18/2022) 60 tablet 0   tamsulosin (FLOMAX) 0.4 MG CAPS capsule TAKE 1 CAPSULE(0.4 MG) BY MOUTH DAILY 90 capsule 0   traZODone (DESYREL) 50 MG tablet Take 1 tablet (50 mg total) by mouth at bedtime as needed for sleep. 30 tablet 0   triamcinolone ointment (KENALOG) 0.5 % Apply 1 Application topically 2 (two) times daily. (Patient not taking: Reported on 03/25/2022) 30 g 0   UNABLE TO FIND PLEASE CHECK FASTING BLOOD GLUCOSE EVERY MORNING 1 each 0   valsartan (DIOVAN) 80 MG tablet Take 1 tablet (80 mg total) by mouth daily. 90 tablet 3   No current facility-administered medications for this visit.   Facility-Administered Medications Ordered in Other Visits  Medication Dose Route Frequency Provider Last Rate Last Admin   heparin lock flush 100 UNIT/ML injection            heparin lock flush 100 unit/mL  500 Units Intravenous Once [name], MD       sodium chloride flush (NS) 0.9 % injection 10 mL  10 mL Intravenous Once [name], MD         PHYSICAL EXAMINATION: Vitals:   03/25/22 1513  BP: (!) 75/54   Pulse: 96  Temp: 97.6 F (36.4 C)  SpO2: 99%   Filed Weights   03/25/22 1513  Weight: 147 lb (66.7 kg)    Physical Exam Constitutional:      General: He is not in acute distress.    Appearance: He is not ill-appearing.     Comments: In recliner. Accompanied by wife sue.   HENT:     Head: Normocephalic and atraumatic.  Cardiovascular:     Rate and Rhythm: Normal rate and regular rhythm.  Pulmonary:     Effort: Pulmonary effort is normal. No respiratory distress.     Breath sounds: No wheezing.     Comments: Decreased breath sound bilaterally. Abdominal:     General: There is no distension.     Palpations: Abdomen is soft.     Tenderness: There is no abdominal tenderness.  Musculoskeletal:     Right lower leg: Edema present.     Left lower leg: Edema present.     Comments: Guarding his low back. Slowed position changes  Skin:    General: Skin is warm and dry.     Findings: No erythema or rash.  Neurological:     Mental Status: He is alert and oriented to person, place, and time. Mental status is at baseline.     Cranial Nerves: No cranial nerve deficit.     Coordination: Coordination normal.  Psychiatric:        Mood and Affect: Mood normal.        Behavior: Behavior normal.   LABORATORY DATA:  I have reviewed the data as listed    Latest Ref Rng & Units 03/25/2022    3:00 PM 03/18/2022   10:08 AM 02/25/2022    8:00 AM  CBC  WBC 4.0 - 10.5 K/uL 21.5  44.7  28.1   Hemoglobin 13.0 - 17.0 g/dL 9.6  7.7  9.2   Hematocrit 39.0 - 52.0 % 28.1  23.2  26.8   Platelets 150 - 400 K/uL 360  444  585       Latest Ref Rng & Units 03/25/2022    3:00 PM 03/18/2022   10:08 AM 02/25/2022    8:00 AM  CMP  Glucose 70 - 99 mg/dL 162  446  950   BUN 8 - 23 mg/dL 14  18  38   Creatinine 0.61 - 1.24 mg/dL 7.22  5.75  0.51   Sodium 135 - 145 mmol/L 131  129  129   Potassium 3.5 - 5.1 mmol/L 3.3  3.3  3.7   Chloride 98 - 111 mmol/L 96  93  96   CO2 22 - 32 mmol/L 23  22  25     Calcium 8.9 - 10.3 mg/dL 9.0  8.0  9.2   Total Protein 6.5 - 8.1 g/dL 6.4  6.4  6.8   Total Bilirubin 0.3 - 1.2 mg/dL 0.3  0.2  0.4   Alkaline Phos 38 - 126 U/L 135  166  116   AST 15 - 41 U/L 42  50  27   ALT 0 - 44 U/L 28  36  20    Iron/TIBC/Ferritin/ %Sat    Component Value Date/Time   IRON 60 02/03/2022 0958   TIBC 391 02/03/2022 0958   FERRITIN 704 (H) 02/03/2022 0958   IRONPCTSAT 15 (L) 02/03/2022 14/06/2021      RADIOGRAPHIC STUDIES: I have personally reviewed the radiological images as listed and agreed with the findings in the report. DG Lumbar Spine Complete  Result Date: 03/22/2022 CLINICAL DATA:  Back pain, history of non-small cell carcinoma LEFT upper lobe, no  injury, pain across lower back off and on for 10 years EXAM: LUMBAR SPINE - COMPLETE 4+ VIEW COMPARISON:  CT abdomen and pelvis 01/27/2022 FINDINGS: Osseous demineralization. Five non-rib-bearing lumbar vertebra. Multilevel disc space narrowing and endplate spur formation. Superior endplate compression deformity of L2 vertebral body with mild anterior height loss, unchanged since 01/27/2022. No additional fracture, subluxation, or bone destruction. Atherosclerotic calcifications aorta. Increased stool throughout proximal half of colon. IMPRESSION: Chronic superior endplate compression deformity of L2 vertebral body. Osseous demineralization with multilevel degenerative disc disease changes. Increased stool in proximal colon. Aortic Atherosclerosis (ICD10-I70.0). Electronically Signed   By: Ulyses Southward M.D.   On: 03/22/2022 21:44   CT Lumbar Spine Wo Contrast  Result Date: 03/20/2022 CLINICAL DATA:  Metastatic disease to lower back. Back pain. Evaluate compression fracture for possible kyphoplasty/RFA. History of lung cancer and melanoma. EXAM: CT LUMBAR SPINE WITHOUT CONTRAST TECHNIQUE: Multidetector CT imaging of the lumbar spine was performed without intravenous contrast administration. Multiplanar CT image reconstructions  were also generated. RADIATION DOSE REDUCTION: This exam was performed according to the departmental dose-optimization program which includes automated exposure control, adjustment of the mA and/or kV according to patient size and/or use of iterative reconstruction technique. COMPARISON:  MRI lumbar spine 12/17/2021 and 04/05/2020. Abdominopelvic CT 11/18/2021 and 01/27/2022. PET-CT 10/29/2021 and 07/24/2021. FINDINGS: Segmentation: There are 5 lumbar type vertebral bodies. Alignment: Chronic straightening without focal angulation or significant anterolisthesis. There is a stable chronic retrolisthesis at L5-S1. Vertebrae: Chronic pathologic fractures involving the superior endplates of L2 and L5 are unchanged from the CT of 11/18/2021 and lumbar MRI 12/17/2021. No progressive loss of height at either level. No new fracture or progressive metastatic disease demonstrated. Grossly stable node metastatic disease in the visualized upper pelvis without pathologic fracture. Paraspinal and other soft tissues: No acute paraspinal findings. There is aortic and branch vessel atherosclerosis. Diffuse subcutaneous edema noted in the back without focal fluid collection. Disc levels: L1-2: Disc bulging with vacuum disc phenomenon. No significant spinal stenosis. L2-3: Mild disc bulging with endplate osteophytes. No significant spinal stenosis. L3-4: Loss of disc height with disc bulging and endplate osteophytes asymmetric to the left. Mild facet and ligamentous hypertrophy. Stable mild asymmetric left lateral recess and left foraminal narrowing. L4-5: Chronic pathologic fracture/Schmorl's node in the superior endplate of L5. Disc desiccation with annular bulging, facet and ligamentous hypertrophy. Resulting mild multifactorial spinal stenosis with mild lateral recess and foraminal narrowing bilaterally, unchanged from most recent MRI. L5-S1: Chronic degenerative disc disease with loss of disc height, disc bulging, endplate  osteophytes and vacuum phenomenon. Chronic mild to moderate foraminal narrowing bilaterally appears unchanged. IMPRESSION: 1. Stable appearance of the lumbar spine compared with most recent MRI of 12/17/2021 and CT of 11/18/2021. 2. Chronic pathologic fractures in the superior endplates of L2 and L5 are unchanged. No progressive loss of height. No new fracture or progressive metastatic disease demonstrated. 3. Multilevel spondylosis as described, similar to most recent MRI. 4.  Aortic Atherosclerosis (ICD10-I70.0). Electronically Signed   By: Carey Bullocks M.D.   On: 03/20/2022 16:32    ASSESSMENT & PLAN:   NSCLC of upper lobe of left lung- stage IV with metastases to brain and bone. Alimta/keytruda held. PET showed good partial response.  Symptomatic anemia- s/p 1 unit pRBCs on 03/18/22. Hemoglobin improved to 9.6. Off eliquis. No transfusion today.  Poor sleep- reviewed sleep hygiene. Encouraged pain control.  Edema- likely related to imapired mobility d/ tneuropathy. Suspect if he can become more mobile (d/t pain), this  will improve.  Eye crusting- possible allergic vs infection. Gentle wash with baby shampoo twice a day. Consider topical antibiotics if worsening.  Back pain- unrelieved. Prefers to avoid narcotics. Limiting mobility and QOL. He has had imaging and has appt with palliative care tomorrow for management. Unlikely to be surgical option. May consider comfort measures such as LSO. I discussed with Elouise Munroe, NP.  Elevated creatinine- suspect related to overdiuresis in setting of heart failure. Gentle rehydration.  Bone metastases- zometa held  Disposition:  RTC as scheduled or if symptoms don't improve in interim.

## 2022-03-26 ENCOUNTER — Inpatient Hospital Stay (HOSPITAL_BASED_OUTPATIENT_CLINIC_OR_DEPARTMENT_OTHER): Payer: PPO | Admitting: Hospice and Palliative Medicine

## 2022-03-26 DIAGNOSIS — G893 Neoplasm related pain (acute) (chronic): Secondary | ICD-10-CM | POA: Diagnosis not present

## 2022-03-26 DIAGNOSIS — Z515 Encounter for palliative care: Secondary | ICD-10-CM

## 2022-03-26 LAB — BRAIN NATRIURETIC PEPTIDE: BNP: 48 pg/mL (ref 0.0–100.0)

## 2022-03-26 MED ORDER — LIDOCAINE 5 % EX PTCH: 1.0000 | MEDICATED_PATCH | CUTANEOUS | 0 refills | Status: DC

## 2022-03-26 MED ORDER — GENTAMICIN SULFATE 0.3 % OP SOLN: 1.0000 [drp] | Freq: Three times a day (TID) | OPHTHALMIC | 0 refills | Status: DC

## 2022-03-26 NOTE — Progress Notes (Signed)
Virtual Visit via Telephone Note  I connected with Micheal Donovan on 03/26/22 at  3:40 PM EST by telephone and verified that I am speaking with the correct person using two identifiers.  Location: Patient: Home Provider: Clinic   I discussed the limitations, risks, security and privacy concerns of performing an evaluation and management service by telephone and the availability of in person appointments. I also discussed with the patient that there may be a patient responsible charge related to this service. The patient expressed understanding and agreed to proceed.   History of Present Illness: Micheal Donovan is a 72 y.o. male with multiple medical problems including non-small cell lung cancer, history of subacute intraparenchymal hemorrhage, PE.  Patient is status post XRT.  He is on systemic chemo.  He is referred to palliative care to address goals and manage ongoing symptoms.    Observations/Objective: I called and spoke with patient and wife.  Patient continues to endorse severe persistent low back pain, primarily with movement and ambulation.  He is subsequently mostly sedentary.  Patient says that he does not have significant pain as long as he does not move.  He is trying not to take the Percocet as he does not like medication.  He is utilizing the fentanyl as directed.  I reviewed results of x-ray and lumbar CT with patient.  I have previously discussed the case with IR and unfortunately imaging at this point does not suggest interventional options.  Patient is pending MRI for further evaluation.  Discussed options with patient regarding pain management.  Will order home health PT and pursue LSO brace.  Will also trial Lidoderm patch.  Assessment and Plan: Neoplasm related pain -patient pending MRI for further evaluation.  Referral to home health PT.  Consider LSO brace.  Start Lidoderm patch.  Continue fentanyl/Percocet  Conjunctivitis -start gentamicin ophthalmic  solution  Follow Up Instructions: Follow-up next week   I discussed the assessment and treatment plan with the patient. The patient was provided an opportunity to ask questions and all were answered. The patient agreed with the plan and demonstrated an understanding of the instructions.   The patient was advised to call back or seek an in-person evaluation if the symptoms worsen or if the condition fails to improve as anticipated.  I provided 15 minutes of non-face-to-face time during this encounter.   Irean Hong, NP

## 2022-03-27 ENCOUNTER — Encounter: Payer: Self-pay | Admitting: Oncology

## 2022-03-27 ENCOUNTER — Encounter: Payer: Self-pay | Admitting: *Deleted

## 2022-03-27 ENCOUNTER — Ambulatory Visit (INDEPENDENT_AMBULATORY_CARE_PROVIDER_SITE_OTHER): Payer: PPO

## 2022-03-27 VITALS — BP 92/60 | Ht 67.0 in | Wt 150.0 lb

## 2022-03-27 DIAGNOSIS — Z0000 Encounter for general adult medical examination without abnormal findings: Secondary | ICD-10-CM | POA: Diagnosis not present

## 2022-03-27 NOTE — Patient Instructions (Signed)
[name] , Thank you for taking time to come for your Medicare Wellness Visit. I appreciate your ongoing commitment to your health goals. Please review the following plan we discussed and let me know if I can assist you in the future.   These are the goals we discussed:  Goals   None     This is a list of the screening recommended for you and due dates:  Health Maintenance  Topic Date Due   Yearly kidney health urinalysis for diabetes  04/02/2022*   Complete foot exam   04/02/2022*   Eye exam for diabetics  04/02/2022*   COVID-19 Vaccine (1) 04/02/2022*   Zoster (Shingles) Vaccine (1 of 2) 05/13/2022*   Pneumonia Vaccine (3 - PPSV23 or PCV20) 02/12/2023*   Colon Cancer Screening  02/12/2023*   Hemoglobin A1C  06/11/2022   Yearly kidney function blood test for diabetes  03/26/2023   DTaP/Tdap/Td vaccine (2 - Td or Tdap) 12/01/2024   Flu Shot  Completed   Hepatitis C Screening: USPSTF Recommendation to screen - Ages 18-79 yo.  Completed   HPV Vaccine  Aged Out  *Topic was postponed. The date shown is not the original due date.    Advanced directives: We have a copy of your advanced directives available in your record should your provider ever need to access them.   Conditions/risks identified: Aim for 30 minutes of exercise or brisk walking, 6-8 glasses of water, and 5 servings of fruits and vegetables each day.   Next appointment: Follow up in one year for your annual wellness visit.   Preventive Care 34 Years and Older, Male  Preventive care refers to lifestyle choices and visits with your health care provider that can promote health and wellness. What does preventive care include? A yearly physical exam. This is also called an annual well check. Dental exams once or twice a year. Routine eye exams. Ask your health care provider how often you should have your eyes checked. Personal lifestyle choices, including: Daily care of your teeth and gums. Regular physical  activity. Eating a healthy diet. Avoiding tobacco and drug use. Limiting alcohol use. Practicing safe sex. Taking low doses of aspirin every day. Taking vitamin and mineral supplements as recommended by your health care provider. What happens during an annual well check? The services and screenings done by your health care provider during your annual well check will depend on your age, overall health, lifestyle risk factors, and family history of disease. Counseling  Your health care provider may ask you questions about your: Alcohol use. Tobacco use. Drug use. Emotional well-being. Home and relationship well-being. Sexual activity. Eating habits. History of falls. Memory and ability to understand (cognition). Work and work Statistician. Screening  You may have the following tests or measurements: Height, weight, and BMI. Blood pressure. Lipid and cholesterol levels. These may be checked every 5 years, or more frequently if you are over 56 years old. Skin check. Lung cancer screening. You may have this screening every year starting at age 72 if you have a 30-pack-year history of smoking and currently smoke or have quit within the past 15 years. Fecal occult blood test (FOBT) of the stool. You may have this test every year starting at age 72 Flexible sigmoidoscopy or colonoscopy. You may have a sigmoidoscopy every 5 years or a colonoscopy every 10 years starting at age 72. Prostate cancer screening. Recommendations will vary depending on your family history and other risks. Hepatitis C blood test. Hepatitis B  blood test. Sexually transmitted disease (STD) testing. Diabetes screening. This is done by checking your blood sugar (glucose) after you have not eaten for a while (fasting). You may have this done every 1-3 years. Abdominal aortic aneurysm (AAA) screening. You may need this if you are a current or former smoker. Osteoporosis. You may be screened starting at age 72 if you are  at high risk. Talk with your health care provider about your test results, treatment options, and if necessary, the need for more tests. Vaccines  Your health care provider may recommend certain vaccines, such as: Influenza vaccine. This is recommended every year. Tetanus, diphtheria, and acellular pertussis (Tdap, Td) vaccine. You may need a Td booster every 10 years. Zoster vaccine. You may need this after age 72. Pneumococcal 13-valent conjugate (PCV13) vaccine. One dose is recommended after age 72. Pneumococcal polysaccharide (PPSV23) vaccine. One dose is recommended after age 72. Talk to your health care provider about which screenings and vaccines you need and how often you need them. This information is not intended to replace advice given to you by your health care provider. Make sure you discuss any questions you have with your health care provider. Document Released: 03/16/2015 Document Revised: 11/07/2015 Document Reviewed: 12/19/2014 Elsevier Interactive Patient Education  2017 [place] Prevention in the Home Falls can cause injuries. They can happen to people of all ages. There are many things you can do to make your home safe and to help prevent falls. What can I do on the outside of my home? Regularly fix the edges of walkways and driveways and fix any cracks. Remove anything that might make you trip as you walk through a door, such as a raised step or threshold. Trim any bushes or trees on the path to your home. Use bright outdoor lighting. Clear any walking paths of anything that might make someone trip, such as rocks or tools. Regularly check to see if handrails are loose or broken. Make sure that both sides of any steps have handrails. Any raised decks and porches should have guardrails on the edges. Have any leaves, snow, or ice cleared regularly. Use sand or salt on walking paths during winter. Clean up any spills in your garage right away. This includes oil  or grease spills. What can I do in the bathroom? Use night lights. Install grab bars by the toilet and in the tub and shower. Do not use towel bars as grab bars. Use non-skid mats or decals in the tub or shower. If you need to sit down in the shower, use a plastic, non-slip stool. Keep the floor dry. Clean up any water that spills on the floor as soon as it happens. Remove soap buildup in the tub or shower regularly. Attach bath mats securely with double-sided non-slip rug tape. Do not have throw rugs and other things on the floor that can make you trip. What can I do in the bedroom? Use night lights. Make sure that you have a light by your bed that is easy to reach. Do not use any sheets or blankets that are too big for your bed. They should not hang down onto the floor. Have a firm chair that has side arms. You can use this for support while you get dressed. Do not have throw rugs and other things on the floor that can make you trip. What can I do in the kitchen? Clean up any spills right away. Avoid walking on wet floors. Keep items  that you use a lot in easy-to-reach places. If you need to reach something above you, use a strong step stool that has a grab bar. Keep electrical cords out of the way. Do not use floor polish or wax that makes floors slippery. If you must use wax, use non-skid floor wax. Do not have throw rugs and other things on the floor that can make you trip. What can I do with my stairs? Do not leave any items on the stairs. Make sure that there are handrails on both sides of the stairs and use them. Fix handrails that are broken or loose. Make sure that handrails are as long as the stairways. Check any carpeting to make sure that it is firmly attached to the stairs. Fix any carpet that is loose or worn. Avoid having throw rugs at the top or bottom of the stairs. If you do have throw rugs, attach them to the floor with carpet tape. Make sure that you have a light  switch at the top of the stairs and the bottom of the stairs. If you do not have them, ask someone to add them for you. What else can I do to help prevent falls? Wear shoes that: Do not have high heels. Have rubber bottoms. Are comfortable and fit you well. Are closed at the toe. Do not wear sandals. If you use a stepladder: Make sure that it is fully opened. Do not climb a closed stepladder. Make sure that both sides of the stepladder are locked into place. Ask someone to hold it for you, if possible. Clearly mark and make sure that you can see: Any grab bars or handrails. First and last steps. Where the edge of each step is. Use tools that help you move around (mobility aids) if they are needed. These include: Canes. Walkers. Scooters. Crutches. Turn on the lights when you go into a dark area. Replace any light bulbs as soon as they burn out. Set up your furniture so you have a clear path. Avoid moving your furniture around. If any of your floors are uneven, fix them. If there are any pets around you, be aware of where they are. Review your medicines with your doctor. Some medicines can make you feel dizzy. This can increase your chance of falling. Ask your doctor what other things that you can do to help prevent falls. This information is not intended to replace advice given to you by your health care provider. Make sure you discuss any questions you have with your health care provider. Document Released: 12/14/2008 Document Revised: 07/26/2015 Document Reviewed: 03/24/2014 Elsevier Interactive Patient Education  2017 [employer].

## 2022-03-27 NOTE — Telephone Encounter (Signed)
Patient requires a lumbosacral LSO brace- order faxed to [facility].

## 2022-03-27 NOTE — Progress Notes (Signed)
Subjective:   Micheal Donovan is a 72 y.o. male who presents for Medicare Annual/Subsequent preventive examination.  I discussed the limitations of evaluation and management by telemedicine. The patient expressed understanding and agreed to proceed.  Review of Systems     Cardiac Risk Factors include: advanced age (>51men, >70 women);male gender     Objective:    Today's Vitals   03/27/22 1513 03/27/22 1530  BP: 92/60   Weight: 150 lb (68 kg)   Height: 5\' 7"  (1.702 m)   PainSc:  4    Body mass index is 23.49 kg/m.     03/27/2022    3:29 PM 03/25/2022    3:10 PM 03/20/2022    1:00 PM 03/18/2022   10:23 AM 02/21/2022   10:12 AM 02/21/2022    9:52 AM 02/19/2022    1:50 PM  Advanced Directives  Does Patient Have a Medical Advance Directive? Yes Yes Yes Yes Yes Yes Yes  Type of Advance Directive Living will;Healthcare Power of Attorney   Living will;Healthcare Power of Attorney Living will;Healthcare Power of Attorney Living will;Healthcare Power of Attorney   Does patient want to make changes to medical advance directive? No - Patient declined  No - Patient declined      Copy of [place] in Chart? Yes - validated most recent copy scanned in chart (See row information)   No - copy requested     Would patient like information on creating a medical advance directive?    No - Patient declined       Current Medications (verified) Outpatient Encounter Medications as of 03/27/2022  Medication Sig   aspirin EC 81 MG tablet Take 1 tablet (81 mg total) by mouth daily. Swallow whole.   atorvastatin (LIPITOR) 40 MG tablet Take 1 tablet (40 mg total) by mouth daily.   Ca Phosphate-Cholecalciferol (CALCIUM WITH D3 PO) Take 1 capsule by mouth daily.   cyclobenzaprine (FLEXERIL) 5 MG tablet Take 1 tablet (5 mg total) by mouth 2 (two) times daily as needed for muscle spasms.   dexamethasone (DECADRON) 4 MG tablet Take 1 tab every 12 hours the day before pemetrexed chemo,  then take 2 tabs once a day for 3 days starting the day after carboplatin.   fentaNYL (DURAGESIC) 50 MCG/HR Place 1 patch onto the skin every 3 (three) days.   furosemide (LASIX) 20 MG tablet Take 1 tablet (20 mg total) by mouth daily as needed (leg swelling or sob).   gentamicin (GARAMYCIN) 0.3 % ophthalmic solution Place 1 drop into both eyes 3 (three) times daily.   NOVOLOG FLEXPEN 100 UNIT/ML FlexPen Inject 0-10 Units into the skin daily as needed for high blood sugar (above 200). Sliding scale   omeprazole (PRILOSEC) 20 MG capsule Take 20 mg by mouth daily.   tamsulosin (FLOMAX) 0.4 MG CAPS capsule TAKE 1 CAPSULE(0.4 MG) BY MOUTH DAILY   traZODone (DESYREL) 50 MG tablet Take 1 tablet (50 mg total) by mouth at bedtime as needed for sleep.   UNABLE TO FIND PLEASE CHECK FASTING BLOOD GLUCOSE EVERY MORNING   valsartan (DIOVAN) 80 MG tablet Take 1 tablet (80 mg total) by mouth daily.   HYDROcodone bit-homatropine (HYCODAN) 5-1.5 MG/5ML syrup Take 5 mLs by mouth every 8 (eight) hours as needed for cough. (Patient not taking: Reported on 02/25/2022)   lidocaine (LIDODERM) 5 % Place 1 patch onto the skin daily. Remove & Discard patch within 12 hours or as directed by MD (Patient not  taking: Reported on 03/27/2022)   LORazepam (ATIVAN) 1 MG tablet Take 1 tablet (1 mg total) by mouth at bedtime. (Patient not taking: Reported on 03/25/2022)   oxyCODONE-acetaminophen (PERCOCET) 7.5-325 MG tablet Take 1 tablet by mouth every 4 (four) hours as needed for severe pain. Do not take with cough medication or other pain medication (Patient not taking: Reported on 03/18/2022)   triamcinolone ointment (KENALOG) 0.5 % Apply 1 Application topically 2 (two) times daily. (Patient not taking: Reported on 03/25/2022)   [DISCONTINUED] prochlorperazine (COMPAZINE) 10 MG tablet Take 1 tablet (10 mg total) by mouth every 6 (six) hours as needed (Nausea or vomiting).   Facility-Administered Encounter Medications as of 03/27/2022   Medication   heparin lock flush 100 UNIT/ML injection    Allergies (verified) Niaspan [niacin]   History: Past Medical History:  Diagnosis Date   Allergy    BPH (benign prostatic hypertrophy)    Cancer (HCC)    Melanoma on Neck    2008   Carotid artery occlusion    Diabetes mellitus    type 2   ED (erectile dysfunction)    GERD (gastroesophageal reflux disease)    Hemorrhagic stroke ([place]) 06/2021   Hyperlipidemia    Hypertension    Lung cancer ([place])    Neoplasm related pain    Retinopathy due to secondary DM [facility])    Past Surgical History:  Procedure Laterality Date   BRONCHIAL NEEDLE ASPIRATION BIOPSY  06/17/2021   Procedure: BRONCHIAL NEEDLE ASPIRATION BIOPSIES;  Surgeon: Candee Furbish, MD;  Location: [place];  Service: Pulmonary;;   IR IMAGING GUIDED PORT INSERTION  08/05/2021   MELANOMA EXCISION  2008   Left side of neck   RADIOLOGY WITH ANESTHESIA N/A 04/05/2020   Procedure: MRI SPINE WITOUT CONTRAST;  Surgeon: Radiologist, Medication, MD;  Location: [place];  Service: Radiology;  Laterality: N/A;   RADIOLOGY WITH ANESTHESIA N/A 08/22/2021   Procedure: MRI BRAIN WITH AND WITHOUT CONTRAST  WITH ANESTHESIA;  Surgeon: Radiologist, Medication, MD;  Location: [place];  Service: Radiology;  Laterality: N/A;   RADIOLOGY WITH ANESTHESIA N/A 12/17/2021   Procedure: MRI BRAIN WITH AND WITHOUT CONTRAST WITH ANESTHESIA; MRI LUMBER WITH AND WITHOUT CONTRAST;  Surgeon: Radiologist, Medication, MD;  Location: [place];  Service: Radiology;  Laterality: N/A;   TONSILLECTOMY     VIDEO BRONCHOSCOPY WITH ENDOBRONCHIAL ULTRASOUND N/A 06/17/2021   Procedure: VIDEO BRONCHOSCOPY WITH ENDOBRONCHIAL ULTRASOUND;  Surgeon: Candee Furbish, MD;  Location: [facility] ENDOSCOPY;  Service: Pulmonary;  Laterality: N/A;   Family History  Problem Relation Age of Onset   COPD Mother    Heart disease Father    Heart disease Brother        MI at age 42   Stroke Neg Hx    Social History   Socioeconomic  History   Marital status: Married    Spouse name: Not on file   Number of children: Not on file   Years of education: Not on file   Highest education level: Not on file  Occupational History   Not on file  Tobacco Use   Smoking status: Former    Types: Cigarettes    Quit date: 07/21/1990    Years since quitting: 31.7   Smokeless tobacco: Never  Vaping Use   Vaping Use: Never used  Substance and Sexual Activity   Alcohol use: No   Drug use: No   Sexual activity: Not on file  Other Topics Concern   Not on file  Social History Narrative   Not on file   Social Determinants of Health   Financial Resource Strain: Low Risk  (03/27/2022)   Overall Financial Resource Strain (CARDIA)    Difficulty of Paying Living Expenses: Not hard at all  Food Insecurity: No Food Insecurity (03/27/2022)   Hunger Vital Sign    Worried About Running Out of Food in the Last Year: Never true    Ran Out of Food in the Last Year: Never true  Transportation Needs: No Transportation Needs (03/27/2022)   PRAPARE - Hydrologist (Medical): No    Lack of Transportation (Non-Medical): No  Physical Activity: Inactive (03/27/2022)   Exercise Vital Sign    Days of Exercise per Week: 0 days    Minutes of Exercise per Session: 0 min  Stress: Stress Concern Present (03/27/2022)   [place]    Feeling of Stress : To some extent  Social Connections: Moderately Isolated (03/27/2022)   Social Connection and Isolation Panel [NHANES]    Frequency of Communication with Friends and Family: More than three times a week    Frequency of Social Gatherings with Friends and Family: Three times a week    Attends Religious Services: Never    Active Member of Clubs or Organizations: No    Attends Music therapist: Never    Marital Status: Married    Tobacco Counseling Counseling given: Not Answered   Clinical  Intake:  Pre-visit preparation completed: Yes  Pain : 0-10 Pain Score: 4  Pain Location: Back  Diabetes: No  How often do you need to have someone help you when you read instructions, pamphlets, or other written materials from your doctor or pharmacy?: 1 - Never  Diabetic?Yes Nutrition Risk Assessment:  Has the patient had any N/V/D within the last 2 months?  No  Does the patient have any non-healing wounds?  No  Has the patient had any unintentional weight loss or weight gain?  No   Diabetes:  Is the patient diabetic?  Yes  If diabetic, was a CBG obtained today?  No  Did the patient bring in their glucometer from home?  No  How often do you monitor your CBG's? As needed .   Financial Strains and Diabetes Management:  Are you having any financial strains with the device, your supplies or your medication? No .  Does the patient want to be seen by Chronic Care Management for management of their diabetes?  No  Would the patient like to be referred to a Nutritionist or for Diabetic Management?  No   Diabetic Exams:  Diabetic Eye Exam: Completed with [facility]; will request notes  Diabetic Foot Exam: Completed at next office visit    Interpreter Needed?: No  Information entered by :: Denman George LPN   Activities of Daily Living    03/27/2022    3:30 PM 12/17/2021    6:48 AM  In your present state of health, do you have any difficulty performing the following activities:  Hearing? 0 0  Vision? 0 0  Difficulty concentrating or making decisions? 0 0  Walking or climbing stairs? 1 0  Dressing or bathing? 0 0  Doing errands, shopping? 1   Preparing Food and eating ? N   Using the Toilet? N   In the past six months, have you accidently leaked urine? N   Do you have problems with loss of bowel control? N  Managing your Medications? N   Managing your Finances? N   Housekeeping or managing your Housekeeping? N     Patient Care Team: [name],  MD as PCP - General (Family Medicine) Janina Mayo, MD as PCP - Cardiology (Cardiology) [name], RN as Oncology Nurse Navigator [name], MD as Consulting Physician (Oncology) Janina Mayo, MD as Consulting Physician (Cardiology) Thelma Comp, OD (Optometry)  Indicate any recent Medical Services you may have received from other than [facility] providers in the past year (date may be approximate).     Assessment:   This is a routine wellness examination for [place].  Hearing/Vision screen Hearing Screening - Comments:: Denies hearing difficulties  Vision Screening - Comments::  up to date with routine eye exams with [facility]    Dietary issues and exercise activities discussed: Current Exercise Habits: The patient does not participate in regular exercise at present   Goals Addressed   None    Depression Screen    03/27/2022    3:29 PM 02/11/2022    2:50 PM 01/29/2022    9:00 AM 12/10/2021    2:02 PM 07/31/2021    2:10 PM 05/04/2017    9:25 AM 01/26/2017    8:15 AM  PHQ 2/9 Scores  PHQ - 2 Score 0 0 0 2 0 0 0  PHQ- 9 Score  0 0 11   0    Fall Risk    03/27/2022    3:28 PM 02/11/2022    2:50 PM 01/29/2022    9:00 AM 01/01/2022   11:10 AM 09/27/2019    1:01 PM  Halfway in the past year? 0 1 1 1  0  Comment     Emmi Telephone Survey: data to providers prior to load  Number falls in past yr: 0 1 0 1   Injury with Fall? 0 1 0 1   Risk for fall due to : Impaired balance/gait;Impaired mobility History of fall(s);Impaired balance/gait;Orthopedic patient History of fall(s);Impaired balance/gait History of fall(s);Impaired balance/gait;Orthopedic patient   Follow up Falls prevention discussed;Education provided;Falls evaluation completed Education provided;Falls prevention discussed Falls prevention discussed Education provided;Falls prevention discussed     FALL RISK PREVENTION PERTAINING TO THE HOME:  Any stairs in or around the home? No  If so,  are there any without handrails? No  Home free of loose throw rugs in walkways, pet beds, electrical cords, etc? Yes  Adequate lighting in your home to reduce risk of falls? Yes   ASSISTIVE DEVICES UTILIZED TO PREVENT FALLS:  Life alert? No  Use of a cane, walker or w/c? Yes  Grab bars in the bathroom? Yes  Shower chair or bench in shower? Yes  Elevated toilet seat or a handicapped toilet? Yes   TIMED UP AND GO:  Was the test performed? Yes   Cognitive Function:        03/27/2022    3:30 PM  6CIT Screen  What Year? 0 points  What month? 0 points  What time? 0 points  Count back from 20 0 points  Months in reverse 0 points  Repeat phrase 0 points  Total Score 0 points    Immunizations Immunization History  Administered Date(s) Administered   Fluad Quad(high Dose 65+) 01/29/2022   Pneumococcal Conjugate-13 07/16/2015   Pneumococcal Polysaccharide-23 07/20/2012   Tdap 12/02/2014    TDAP status: Up to date  Flu Vaccine status: Up to date  Pneumococcal vaccine status: Due, Education has been  provided regarding the importance of this vaccine. Advised may receive this vaccine at local pharmacy or Health Dept. Aware to provide a copy of the vaccination record if obtained from local pharmacy or Health Dept. Verbalized acceptance and understanding.  Covid-19 vaccine status: Information provided on how to obtain vaccines.   Qualifies for Shingles Vaccine? Yes   Zostavax completed No   Shingrix Completed?: No.    Education has been provided regarding the importance of this vaccine. Patient has been advised to call insurance company to determine out of pocket expense if they have not yet received this vaccine. Advised may also receive vaccine at local pharmacy or Health Dept. Verbalized acceptance and understanding.  Screening Tests Health Maintenance  Topic Date Due   Diabetic kidney evaluation - Urine ACR  04/02/2022 (Originally 03/06/2017)   FOOT EXAM  04/02/2022  (Originally 05/05/2018)   OPHTHALMOLOGY EXAM  04/02/2022 (Originally 06/02/2017)   COVID-19 Vaccine (1) 04/02/2022 (Originally 09/24/1955)   Zoster Vaccines- Shingrix (1 of 2) 05/13/2022 (Originally [birth year]/1969)   Pneumonia Vaccine 67+ Years old (3 - PPSV23 or PCV20) 02/12/2023 (Originally 07/15/2020)   COLONOSCOPY (Pts 45-64yrs Insurance coverage will need to be confirmed)  02/12/2023 (Originally 09/24/1995)   HEMOGLOBIN A1C  06/11/2022   Diabetic kidney evaluation - eGFR measurement  03/26/2023   DTaP/Tdap/Td (2 - Td or Tdap) 12/01/2024   INFLUENZA VACCINE  Completed   Hepatitis C Screening  Completed   HPV VACCINES  Aged Out    Health Maintenance  There are no preventive care reminders to display for this patient.  Colorectal cancer screening: No longer required.   Lung Cancer Screening: (Low Dose CT Chest recommended if Age 39-80 years, 30 pack-year currently smoking OR have quit w/in 15years.) does not qualify.   Lung Cancer Screening Referral: n/a   Additional Screening:  Hepatitis C Screening: does qualify; Completed 04/29/17  Vision Screening: Recommended annual ophthalmology exams for early detection of glaucoma and other disorders of the eye. Is the patient up to date with their annual eye exam?  Yes  Who is the provider or what is the name of the office in which the patient attends annual eye exams? [facility]  If pt is not established with a provider, would they like to be referred to a provider to establish care? No .   Dental Screening: Recommended annual dental exams for proper oral hygiene  Community Resource Referral / Chronic Care Management: CRR required this visit?  No   CCM required this visit?  No      Plan:     I have personally reviewed and noted the following in the patient's chart:   Medical and social history Use of alcohol, tobacco or illicit drugs  Current medications and supplements including opioid prescriptions. Patient is currently  taking opioid prescriptions. Information provided to patient regarding non-opioid alternatives. Patient advised to discuss non-opioid treatment plan with their provider. Functional ability and status Nutritional status Physical activity Advanced directives List of other physicians Hospitalizations, surgeries, and ER visits in previous 12 months Vitals Screenings to include cognitive, depression, and falls Referrals and appointments  In addition, I have reviewed and discussed with patient certain preventive protocols, quality metrics, and best practice recommendations. A written personalized care plan for preventive services as well as general preventive health recommendations were provided to patient.     Denman George [place], [place]   [id number]   Nurse Notes: No concerns

## 2022-04-01 ENCOUNTER — Other Ambulatory Visit: Payer: PPO

## 2022-04-01 ENCOUNTER — Ambulatory Visit: Payer: PPO

## 2022-04-01 ENCOUNTER — Ambulatory Visit: Payer: PPO | Admitting: Oncology

## 2022-04-02 ENCOUNTER — Other Ambulatory Visit: Payer: Self-pay

## 2022-04-02 DIAGNOSIS — I10 Essential (primary) hypertension: Secondary | ICD-10-CM

## 2022-04-02 DIAGNOSIS — C3412 Malignant neoplasm of upper lobe, left bronchus or lung: Secondary | ICD-10-CM

## 2022-04-02 DIAGNOSIS — I6523 Occlusion and stenosis of bilateral carotid arteries: Secondary | ICD-10-CM

## 2022-04-02 DIAGNOSIS — C7951 Secondary malignant neoplasm of bone: Secondary | ICD-10-CM

## 2022-04-02 DIAGNOSIS — E119 Type 2 diabetes mellitus without complications: Secondary | ICD-10-CM

## 2022-04-03 ENCOUNTER — Telehealth: Payer: Self-pay | Admitting: Pharmacist

## 2022-04-03 ENCOUNTER — Ambulatory Visit: Payer: PPO

## 2022-04-03 NOTE — Progress Notes (Signed)
Care Management & Coordination Services Pharmacy Team  Reason for Encounter: Appointment Reminder  Contacted patient to confirm telephone appointment with Leata Mouse, PharmD on 04/07/2022 at 11 am. Unsuccessful outreach. Left voicemail for patient to return call.  Do you have any problems getting your medications? ** If yes what types of problems are you experiencing? **  What is your top health concern you would like to discuss at your upcoming visit?   Have you seen any other providers since your last visit with PCP? **   Chart review:  Recent office visits:  03/07/2022 OV (PCP) [name], MD; will start valsartan 80 mg a day for treatment of heart failure. I will defer adding Jardiance to his follow-up appointment with his cardiologist.   start him on fentanyl 25 mcg transdermal every 72 hours to try to get better control of his pain. He can use Percocet for breakthrough pain.   02/14/2022 OV (PCP) [name], MD; I gave the patient Lasix today 20 mg daily as needed for swelling. I recommended that he take his weight every day and if he gains more than 2 pounds in 24 hours he use Lasix. We also discussed treatment for congestive heart failure including beta-blockers, ARB, Entresto, spironolactone, and SGLT2 inhibitors. I suggested that we start the patient on Entresto. However the patient would like to discuss the situation with his oncologist and see a cardiologist for a second opinion. Therefore I will arrange cardiology consultation.   02/11/2022 OV (PCP) [name], MD; no medication changes indicated.  01/29/2022 OV (PCP) [name], MD; I gave the patient a sliding scale for rapid acting insulin as I anticipate that the prednisone will likely exacerbate his glycemic control. Sliding scale starts at 150 and prescribes 2 units of insulin for every 50 points greater than 150.   01/01/2022 OV (PCP) [name], MD; I given the patient a prednisone  taper pack and affect to mimic a cortisone injection in his lower back.   12/10/2021 OV (PCP) [name], MD; I will give the patient lorazepam 1 mg p.o. nightly to try to help with his insomnia. Consider also trying something like Vesicare to help with overactive bladder to maybe reduce some of the nocturnal frequency   Recent consult visits:  03/26/2022 OV (Oncology) Borders, Kirt Boys, NP; Conjunctivitis -start gentamicin ophthalmic solution.  03/25/2022 OV (Oncology) Verlon Au, NP; no office note available.  03/25/2022 OV (Cardiology) Janina Mayo, MD; no medication changes indicated.  03/18/2022 OV (Oncology) Borders, Kirt Boys, NP; Start PRN trazodone   03/18/2022 OV (Oncology) [name], MD; no medication changes noted.  02/25/2022 OV (Oncology) [name], MD; Currently on 60 mg of prednisone. Glucoses trending higher. I recommend patient to taper down to 20 mg daily for 3 days followed by 10 mg for 3 days and then stop.   02/21/2022 OV (Oncology) Hughie Closs, PA-C; . For now he will continue on his doxycycline, start his prednisone and continue the strong topical steroid prescribed by his dermatologist   02/21/2022 OV (Oncology) Ventura Sellers, MD; no medication changes noted.  02/19/2022 OV (Oncology) Hughie Closs, PA-C; Worsening per patient even on doxycyline (though has only been on this medication for less than 48 hours). May be secondary to his Keytruda. Starting on prednisone and topical steroids.   02/17/2022 OV (Oncology) Hughie Closs, PA-C; For now doxycyline x 10 days.  02/13/2022 OV (Oncology) [name], MD;  Recommend daily  Zarxio 332mcg x2 , I recommend him to take Claritin 10mg  daily x 4 days.  Stop Eliquis 2.5mg  BID.   02/03/2022 OV (Oncology) [name], MD; Proceed with Alimta/Keytruda today , Hold Eliquis. Refer to neurology.  01/13/2022 OV (Oncology) [name], MD; Stop Eliquis 2.5mg  BID. Recommend Zometa every 4- 6 weeks. Recommend  calcium supplementation.  12/23/2021 OV (Oncology) [name], MD; Stop Eliquis 2.5mg  BID. Recommend Zometa every 4- 6 weeks. Recommend calcium supplementation.  12/02/2021 OV (Oncology) [name], MD; Recommend Zometa every 4- 6 weeks. Recommend calcium supplementation.  11/11/2021 OV (Oncology) [name], MD; Hypokalemia, recommend potassium supplementation 25meq daily Recommend Zometa every 4- 6 weeks. Recommend calcium supplementation. Recommend trial of Flowmax, if no improvement, will refer to urology.  11/01/2021 OV (Oncology) Borders, Kirt Boys, NP;  recommended Dulcolax daily.  10/21/2021 OV (Oncology) [name], MD;  Eliquis is decreased to 2.5mg  BID   Hospital visits:  12/17/2021 MRI BRAIN WITH AND WITHOUT CONTRAST WITH ANESTHESIA  11/18/2021 ED to Hospital Admission due to Neutropenic fever -No medication changes indicated.  Star Rating Drugs:  Atorvastatin 40 mg last filled 02/21/2022 90 DS Lantus last filled 03/27/2022 44 DS Valsartan 80 mg last filled 03/07/2022 90 DS  Care Gaps: Annual wellness visit in last year? No  If Diabetic: Last eye exam / retinopathy screening: Last diabetic foot exam:  Future Appointments  Date Time Provider [place]  04/04/2022 10:00 AM [facility] None  04/04/2022 10:15 AM [name], MD [facility] None  04/04/2022  2:30 PM [name], MD [facility]  04/07/2022 11:00 AM Edythe Clarity, RPH [facility] None  04/08/2022 10:00 AM [facility]-MR 2 [facility]MRI [facility]  04/23/2022  1:30 PM Rosalyn Gess, OT [facility] None  05/07/2022  4:00 PM MC-CV [facility] [place] 1 [facility] None  06/25/2022 10:00 AM Noreene Filbert, MD [facility] None  06/30/2022  2:00 PM Janina Mayo, MD [facility] None  04/02/2023  3:00 PM BSFM-NURSE HEALTH ADVISOR [facility] [place], [place] Pharmacist Assistant [phone]

## 2022-04-04 ENCOUNTER — Other Ambulatory Visit: Payer: Self-pay

## 2022-04-04 ENCOUNTER — Encounter: Payer: Self-pay | Admitting: Oncology

## 2022-04-04 ENCOUNTER — Ambulatory Visit: Payer: PPO | Admitting: Family Medicine

## 2022-04-04 ENCOUNTER — Inpatient Hospital Stay: Payer: PPO | Attending: Radiation Oncology

## 2022-04-04 ENCOUNTER — Inpatient Hospital Stay (HOSPITAL_BASED_OUTPATIENT_CLINIC_OR_DEPARTMENT_OTHER): Payer: PPO | Admitting: Oncology

## 2022-04-04 ENCOUNTER — Telehealth: Payer: Self-pay

## 2022-04-04 VITALS — BP 95/61 | HR 91 | Temp 97.6°F | Resp 18 | Wt 153.5 lb

## 2022-04-04 DIAGNOSIS — N401 Enlarged prostate with lower urinary tract symptoms: Secondary | ICD-10-CM | POA: Diagnosis not present

## 2022-04-04 DIAGNOSIS — C7951 Secondary malignant neoplasm of bone: Secondary | ICD-10-CM

## 2022-04-04 DIAGNOSIS — E861 Hypovolemia: Secondary | ICD-10-CM

## 2022-04-04 DIAGNOSIS — Z87891 Personal history of nicotine dependence: Secondary | ICD-10-CM | POA: Insufficient documentation

## 2022-04-04 DIAGNOSIS — I619 Nontraumatic intracerebral hemorrhage, unspecified: Secondary | ICD-10-CM

## 2022-04-04 DIAGNOSIS — I700 Atherosclerosis of aorta: Secondary | ICD-10-CM | POA: Insufficient documentation

## 2022-04-04 DIAGNOSIS — M7989 Other specified soft tissue disorders: Secondary | ICD-10-CM | POA: Diagnosis not present

## 2022-04-04 DIAGNOSIS — G893 Neoplasm related pain (acute) (chronic): Secondary | ICD-10-CM

## 2022-04-04 DIAGNOSIS — Z794 Long term (current) use of insulin: Secondary | ICD-10-CM | POA: Diagnosis not present

## 2022-04-04 DIAGNOSIS — I9589 Other hypotension: Secondary | ICD-10-CM

## 2022-04-04 DIAGNOSIS — Z95828 Presence of other vascular implants and grafts: Secondary | ICD-10-CM

## 2022-04-04 DIAGNOSIS — R630 Anorexia: Secondary | ICD-10-CM | POA: Diagnosis not present

## 2022-04-04 DIAGNOSIS — C3412 Malignant neoplasm of upper lobe, left bronchus or lung: Secondary | ICD-10-CM

## 2022-04-04 DIAGNOSIS — Z79899 Other long term (current) drug therapy: Secondary | ICD-10-CM | POA: Diagnosis not present

## 2022-04-04 DIAGNOSIS — I959 Hypotension, unspecified: Secondary | ICD-10-CM | POA: Diagnosis not present

## 2022-04-04 DIAGNOSIS — Z7982 Long term (current) use of aspirin: Secondary | ICD-10-CM | POA: Diagnosis not present

## 2022-04-04 DIAGNOSIS — D539 Nutritional anemia, unspecified: Secondary | ICD-10-CM | POA: Diagnosis not present

## 2022-04-04 DIAGNOSIS — E785 Hyperlipidemia, unspecified: Secondary | ICD-10-CM | POA: Insufficient documentation

## 2022-04-04 DIAGNOSIS — D518 Other vitamin B12 deficiency anemias: Secondary | ICD-10-CM

## 2022-04-04 DIAGNOSIS — C7931 Secondary malignant neoplasm of brain: Secondary | ICD-10-CM | POA: Insufficient documentation

## 2022-04-04 DIAGNOSIS — E538 Deficiency of other specified B group vitamins: Secondary | ICD-10-CM | POA: Diagnosis not present

## 2022-04-04 DIAGNOSIS — E11319 Type 2 diabetes mellitus with unspecified diabetic retinopathy without macular edema: Secondary | ICD-10-CM | POA: Insufficient documentation

## 2022-04-04 DIAGNOSIS — K219 Gastro-esophageal reflux disease without esophagitis: Secondary | ICD-10-CM | POA: Insufficient documentation

## 2022-04-04 DIAGNOSIS — I2699 Other pulmonary embolism without acute cor pulmonale: Secondary | ICD-10-CM

## 2022-04-04 DIAGNOSIS — I10 Essential (primary) hypertension: Secondary | ICD-10-CM | POA: Insufficient documentation

## 2022-04-04 LAB — COMPREHENSIVE METABOLIC PANEL
ALT: 14 U/L (ref 0–44)
AST: 30 U/L (ref 15–41)
Albumin: 2.8 g/dL — ABNORMAL LOW (ref 3.5–5.0)
Alkaline Phosphatase: 112 U/L (ref 38–126)
Anion gap: 10 (ref 5–15)
BUN: 16 mg/dL (ref 8–23)
CO2: 24 mmol/L (ref 22–32)
Calcium: 8.8 mg/dL — ABNORMAL LOW (ref 8.9–10.3)
Chloride: 101 mmol/L (ref 98–111)
Creatinine, Ser: 1.4 mg/dL — ABNORMAL HIGH (ref 0.61–1.24)
GFR, Estimated: 54 mL/min — ABNORMAL LOW (ref >60)
Glucose, Bld: 176 mg/dL — ABNORMAL HIGH (ref 70–99)
Potassium: 3.6 mmol/L (ref 3.5–5.1)
Sodium: 135 mmol/L (ref 135–145)
Total Bilirubin: 0.3 mg/dL (ref 0.3–1.2)
Total Protein: 6.1 g/dL — ABNORMAL LOW (ref 6.5–8.1)

## 2022-04-04 LAB — CBC WITH DIFFERENTIAL/PLATELET
Abs Immature Granulocytes: 0.13 10*3/uL — ABNORMAL HIGH (ref 0.00–0.07)
Basophils Absolute: 0 10*3/uL (ref 0.0–0.1)
Basophils Relative: 0 %
Eosinophils Absolute: 0.1 10*3/uL (ref 0.0–0.5)
Eosinophils Relative: 1 %
HCT: 25.5 % — ABNORMAL LOW (ref 39.0–52.0)
Hemoglobin: 8.6 g/dL — ABNORMAL LOW (ref 13.0–17.0)
Immature Granulocytes: 1 %
Lymphocytes Relative: 6 %
Lymphs Abs: 0.8 10*3/uL (ref 0.7–4.0)
MCH: 35 pg — ABNORMAL HIGH (ref 26.0–34.0)
MCHC: 33.7 g/dL (ref 30.0–36.0)
MCV: 103.7 fL — ABNORMAL HIGH (ref 80.0–100.0)
Monocytes Absolute: 1 10*3/uL (ref 0.1–1.0)
Monocytes Relative: 8 %
Neutro Abs: 11.2 10*3/uL — ABNORMAL HIGH (ref 1.7–7.7)
Neutrophils Relative %: 84 %
Platelets: 355 10*3/uL (ref 150–400)
RBC: 2.46 MIL/uL — ABNORMAL LOW (ref 4.22–5.81)
RDW: 18.1 % — ABNORMAL HIGH (ref 11.5–15.5)
WBC: 13.2 10*3/uL — ABNORMAL HIGH (ref 4.0–10.5)
nRBC: 0 % (ref 0.0–0.2)

## 2022-04-04 LAB — CORTISOL: Cortisol, Plasma: 15.9 ug/dL

## 2022-04-04 LAB — FOLATE: Folate: 4.3 ng/mL — ABNORMAL LOW (ref >5.9)

## 2022-04-04 LAB — SAMPLE TO BLOOD BANK

## 2022-04-04 MED ORDER — FENTANYL 50 MCG/HR TD PT72: 1.0000 | MEDICATED_PATCH | TRANSDERMAL | 0 refills | Status: DC

## 2022-04-04 MED ORDER — SODIUM CHLORIDE 0.9% FLUSH
10.0000 mL | Freq: Once | INTRAVENOUS | Status: AC
  Administered 2022-04-04: 10 mL via INTRAVENOUS
  Filled 2022-04-04: qty 10

## 2022-04-04 MED ORDER — DRONABINOL 5 MG PO CAPS: 5.0000 mg | ORAL_CAPSULE | Freq: Two times a day (BID) | ORAL | 0 refills | Status: DC

## 2022-04-04 MED ORDER — GABAPENTIN 100 MG PO CAPS: 100.0000 mg | ORAL_CAPSULE | Freq: Three times a day (TID) | ORAL | 0 refills | Status: DC

## 2022-04-04 MED ORDER — HEPARIN SOD (PORK) LOCK FLUSH 100 UNIT/ML IV SOLN
500.0000 [IU] | Freq: Once | INTRAVENOUS | Status: DC
  Filled 2022-04-04: qty 5

## 2022-04-04 MED ORDER — HEPARIN SOD (PORK) LOCK FLUSH 100 UNIT/ML IV SOLN
500.0000 [IU] | Freq: Once | INTRAVENOUS | Status: AC
  Administered 2022-04-04: 500 [IU] via INTRAVENOUS
  Filled 2022-04-04: qty 5

## 2022-04-04 MED ORDER — IRON-VITAMIN C 65-125 MG PO TABS: 1.0000 | ORAL_TABLET | Freq: Every day | ORAL | 1 refills | Status: DC

## 2022-04-04 NOTE — Progress Notes (Unsigned)
Pt here for follow up. Pt reports that he is still having trouble sleeping and trazodone and/or Ativan not helping. He has poor appetite but declined nutrition referral. Has neuropathy to ankles and feet, however it has improved. Pt also reports that eyes have been watery and he was given eyedrops but they are not helping.

## 2022-04-04 NOTE — Assessment & Plan Note (Signed)
Recommend Zometa every 4- 6 weeks.-Hold today Recommend calcium supplementation.  Repeat MRI lumbar with and without contrast for evaluation.  Possible need of IR consultation/kyphoplasty

## 2022-04-04 NOTE — Telephone Encounter (Signed)
PA for dronabinol submitted via covermymeds portal   Key: [id number] PA Case ID: # [id number] Rx # [id number]

## 2022-04-04 NOTE — Telephone Encounter (Signed)
-----   Message from Secundino Ginger sent at 04/04/2022 11:22 AM EST ----- Regarding: O'Donnell sent to his chart for Dronabinol.

## 2022-04-04 NOTE — Assessment & Plan Note (Signed)
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  PET scan images were reviewed with patient.- good partial response.  Labs are reviewed and discussed with patient. Hold  maintenance Alimta/Keytruda  -dose reduce Alimta 350mg /m2 Kidney function is worse today-likely due to overdiuresis.  I will hold off repeating CT with contrast right away until hopefully his kidney function improved

## 2022-04-05 ENCOUNTER — Encounter: Payer: Self-pay | Admitting: Oncology

## 2022-04-05 DIAGNOSIS — I951 Orthostatic hypotension: Secondary | ICD-10-CM | POA: Insufficient documentation

## 2022-04-05 DIAGNOSIS — I959 Hypotension, unspecified: Secondary | ICD-10-CM | POA: Insufficient documentation

## 2022-04-05 LAB — ACTH: C206 ACTH: 35.4 pg/mL (ref 7.2–63.3)

## 2022-04-05 MED ORDER — FOLIC ACID 800 MCG PO TABS: 800.0000 ug | ORAL_TABLET | Freq: Every day | ORAL | 1 refills | Status: DC

## 2022-04-05 NOTE — Assessment & Plan Note (Signed)
Hold Eliquis.  Follow-up with Dr. Mickeal Skinner

## 2022-04-05 NOTE — Assessment & Plan Note (Signed)
Check cortisol and ACTH  Probably due to poor oral intake.  Recommend to hold off Lasix and losartan.

## 2022-04-05 NOTE — Assessment & Plan Note (Addendum)
S/p 1 unit of PRBC transfusion Hemoglobin has  improved, still low. Last Alimta is more than 1 month ago. Adequate B12, folate is low Recommend folic acid supplementation.  Kidney function is worse, possible anemia due to CKD.  Recommend oral iron supplementation.  Close monitor.

## 2022-04-05 NOTE — Assessment & Plan Note (Signed)
MRI brain shows persistent microbleeds.   Off Eliquis 2.5mg  BID.

## 2022-04-05 NOTE — Progress Notes (Signed)
Hematology/Oncology Progress note [phone] [phone]) [phone]       CHIEF COMPLAINTS/REASON FOR VISIT:  Metastatic non-small cell lung cancer  ASSESSMENT & PLAN:   Cancer Staging  Primary non-small cell carcinoma of upper lobe of left lung (HCC) Staging form: Lung, AJCC 8th Edition - Clinical: Stage IV (cT2b, cN1, cM1) - Signed by [name], MD on 08/06/2021   Primary non-small cell carcinoma of upper lobe of left lung ([place]) Stage IV lung adenocarcinoma with brain and bone metastasis.  PET scan images were reviewed with patient.- good partial response.  Labs are reviewed and discussed with patient. Hold  maintenance Alimta/Keytruda  -dose reduce Alimta 350mg /m2 Due to decrease PS, continue hold Alimta, consider resuming Keytruda at next visit.  Repeat CT chest abdomen pelvis wo contrast   Metastasis to bone (HCC) Recommend Zometa every 4- 6 weeks.-Hold today Recommend calcium supplementation.  Repeat MRI lumbar with and without contrast for evaluation.    Macrocytic anemia S/p 1 unit of PRBC transfusion Hemoglobin has  improved, still low. Last Alimta is more than 1 month ago. Adequate B12, folate is low Recommend folic acid supplementation.  Kidney function is worse, possible anemia due to CKD.  Recommend oral iron supplementation.  Close monitor.   Intraparenchymal hemorrhage of brain (HCC) Hold Eliquis.  Follow-up with Dr. Mickeal Skinner  Neoplasm related pain Back pain, MRI lumbar showed compression fracture Continue PRN percocet, recently added fentanyl patch recommend Flexeril  I discussed with palliative care service [name] who will see him today as. Well. I agree with repeating MRI lumbar, possible IR eval for kyphoplasty   Pulmonary embolus ([place]) MRI brain shows persistent microbleeds.   Off Eliquis 2.5mg  BID.   B12 deficiency anemia B12 level has improved.   Hypotension Check cortisol and ACTH  Probably due to poor oral intake.   Recommend to hold off Lasix and losartan.     Orders Placed This Encounter  Procedures   CT CHEST ABDOMEN PELVIS WO CONTRAST    Standing Status:   Future    Standing Expiration Date:   04/05/2023    Order Specific Question:   Preferred imaging location?    Answer:   [facility]    Order Specific Question:   Is Oral Contrast requested for this exam?    Answer:   Yes, Per Radiology protocol    Order Specific Question:   Does the patient have a contrast media/X-ray dye allergy?    Answer:   No   CBC with Differential/Platelet    Standing Status:   Future    Number of Occurrences:   1    Standing Expiration Date:   04/04/2023   Comprehensive metabolic panel    Standing Status:   Future    Number of Occurrences:   1    Standing Expiration Date:   04/04/2023   CBC with Differential    Standing Status:   Future    Standing Expiration Date:   04/15/2023   Comprehensive metabolic panel    Standing Status:   Future    Standing Expiration Date:   04/15/2023   CBC with Differential    Standing Status:   Future    Standing Expiration Date:   05/06/2023   Comprehensive metabolic panel    Standing Status:   Future    Standing Expiration Date:   05/06/2023   Retic Panel    Standing Status:   Future    Standing Expiration Date:   04/15/2023   Hold Tube-  Blood Bank    Standing Status:   Future    Number of Occurrences:   1    Standing Expiration Date:   04/05/2023   Follow up 1 week  All questions were answered. The patient knows to call the clinic with any problems, questions or concerns.  [name], MD, [name] [facility] Hematology Oncology 04/04/2022      HISTORY OF PRESENTING ILLNESS:   [facility] is a  72 y.o.  male presents for follow up of Non-small cell lung cancer.  Oncology History Overview Note  Diagnosis: Stage IIB T2b N1 M0 adenocarcinoma of the LUL, poorly differentiated     Primary non-small cell carcinoma of upper lobe of left lung ([place])  06/13/2021 Initial Diagnosis    Primary non-small cell carcinoma of upper lobe of left lung [facility]) -06/20/2021 - 06/27/2021, patient presented to [facility] due to progressive headache/dizziness/gait changes. CT head showed acute to subacute intraparenchymal hemorrhage involving the left cerebellum.  Surrounding low-density vasogenic edema.  Patient was transferred to [facility]. This was further evaluated by CT angiogram of the neck which showed bulky calcified plaques/stenosis of carotid artery, Followed by MRI brain. 06/12/2021, MRI of the brain showed no significant interval change in size of the left cerebellar intraparenchymal hematoma with unchanged regional mass effect and partial effacement of fourth ventricle but no upstream hydrocephalus. There is no discernible enhancement to suggest underlying mass lesion, though acute blood products could mask enhancement.   06/12/2021 a chest x-ray showed a 4.4 cm left middle lobe. 06/13/2021, CT chest with contrast showed a 4.3 x 3.6 x 3.3 cm lobular spiculated mass in the posterior left upper lobe with T3 to the lateral pleura and major fissure.  Metastatic left hilar lymphadenopathy.  Peripheral micronodularity posterior right costophrenic sulcus.  Aortic atherosclerosis. 06/14/2021, CT abdomen pelvis showed right lower lobe pulmonary artery embolus.  No evidence of right heart strain.  No acute intra-abdominal or pelvic pathology.  Aortic atherosclerosis.  06/13/2021, patient underwent bronchoscopy with EBUS by [facility].  Biopsy from the fine-needle aspiration of station 11 mL showed malignant cells, consistent with poorly differentiated non-small cell carcinoma, consistent with adenocarcinoma.  Malignant cells are TTF-1 positive and negative for p40.  Negative for neuroendocrine markers.  Blood test and tissue sample were tested for Gardant 360  -PD-L1 TPS 97%, no actionable mutation on the blood testing. 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   07/17/2021  Cancer Staging   Staging form: Lung, AJCC 8th Edition - Clinical: Stage IV (cT2b, cN1, cM1) - Signed by [name], MD on 08/06/2021   08/06/2021 Imaging   I saw patient's PET scan after his visit with me on 08/06/21. PET scan was ordered by his previous oncologist group and did not come to my in basket.. Patient received cycle 1 carboplatin Taxol today.  He has started on radiation.  5/26/3 PET scan showed 3.8 cm hypermetabolic left upper lobe mass with hypermetabolic left hilar and infrahilar adenopathy.  There is approximately 8 scattered metastatic lesions in the skeleton. Mixed density photopenic lesion in the left cerebellum. characterized as hemorrhage on recent prior imaging workups.    08/16/2021 -  Chemotherapy   Patient is on Treatment Plan : LUNG Carboplatin (5) + Pemetrexed + Pembrolizumab (200) D1 q21d Induction x 4 cycles / Maintenance Pemetrexed + Pembrolizumab (200) D1 q21d      08/22/2021 Imaging   MRI brain w wo contrast  Decrease in size of left cerebellar hematoma with  resolution of edema. No evidence of underlying lesion. Smaller, more recent hemorrhage in the left cerebellar vermis with minimal edema. There is minimal enhancement without definite evidence of underlying lesion.  New punctate focus of chronic blood products and enhancement in the left frontal lobe. Additional new foci of chronic blood products in the posterior right putamen, right parietal subcortical white matter, and posterior right cerebellum. Unclear at this time if these represent foci of bland hemorrhage or early metastases.   Increase in size of right parietal osseous metastasis with minor extraosseous extension.     Imaging   PET scan showed 1. Interval response to therapy as evidenced by a small residual left upper lobe nodule with decreased hypermetabolism, no residual hypermetabolic adenopathy and decreased hypermetabolism associatedwith osseous metastases. 2. 6 mm posterior left upper lobe nodule,  likely stable. Recommend attention on follow-up. 3. Aortic atherosclerosis (ICD10-I70.0). Coronary artery calcification.   12/17/2021 Imaging   MRI lumbar spine w wo contrast  1. No evidence of regional metastatic disease. 2. Late subacute superior endplate fracture at L2 and large superior endplate Schmorl's node at L5, unchanged since the PET scan of 10/29/2021. 3. L3-4: Shallow disc protrusion. Facet and ligamentous hypertrophy. Stenosis of both lateral recesses. Findings slightly worsened since 2022. 4. L4-5: Shallow disc protrusion. Facet and ligamentous hypertrophy. Small synovial cyst arising from the facet joint on the right. Stenosis of the lateral recesses right worse than left. Foraminal narrowing right worse than left. Findings have worsened since 2022.  5. L5-S1: Endplate osteophytes and shallow protrusion of the disc. Facet and ligamentous hypertrophy. Stenosis of the subarticular lateral recesses and neural foramina, right worse than left. Similar appearance to the study of 2022. 6. Continued evidence of enteritis of at least 1 loop of small bowel. Distended bladder present   12/18/2021 Imaging   [facility] MRI w wo  1. Interval decrease in size of the left cerebellar and vermian hematomas. No definite evidence of underlying metastatic lesion. 2. There is are two possible contrast enhancing lesions in the posterior left frontal lobe and left occipital lobe, which do not have intrinsic T1 signal abnormality, but have a somewhat linear appearance and may be vascular in nature. Recommend attention on follow up. 3. Multifocal sites of susceptibility artifact are redemonstrated with interval development of a few new foci, as described above. All of these sites demonstrate intrinsic T1 hyperintense signal abnormality and susceptibility artifact and are compatible with sites of microhemorrhages. Recommend continued attention on follow up. 4. Interval decrease in size of right parietal calvarial  metastatic lesion. No new contrast enhancing lesions visualized. 5. Diffusely heterogeneous marrow signal throughout the cervical spine, which is nonspecific but can be seen in the setting of anemia, smoking, obesity, or a marrow replacement process.     12/18/2021 Imaging   MRI lumbar spine w wo contrast  1. No evidence of regional metastatic disease. 2. Late subacute superior endplate fracture at L2 and large superior endplate Schmorl's node at L5, unchanged since the PET scan of 10/29/2021 3. L3-4: Shallow disc protrusion. Facet and ligamentous hypertrophy.Stenosis of both lateral recesses. Findings slightly worsened since 2022. 4. L4-5: Shallow disc protrusion. Facet and ligamentous hypertrophy. Small synovial cyst arising from the facet joint on the right. Stenosis of the lateral recesses right worse than left. Foraminal narrowing right worse than left. Findings have worsened since 2022. 5. L5-S1: Endplate osteophytes and shallow protrusion of the disc. Facet and ligamentous hypertrophy. Stenosis of the subarticular lateral recesses and neural foramina, right worse  than left. Similar appearance to the study of 2022. 6. Continued evidence of enteritis of at least 1 loop of small bowel. Distended bladder present.       01/28/2022 Imaging   CT chest abdomen pelvis w contrast 1. Treated left upper lobe mass appears grossly stable to the prior examination when measured in a similar fashion on the prior study. No definite signs of extra skeletal metastatic disease noted in the chest, abdomen or pelvis. 2. Widespread skeletal metastases redemonstrated, as above. 3. Hepatic steatosis. 4. Aortic atherosclerosis, in addition to left main and three-vessel coronary artery disease. Assessment for potential risk factor modification, dietary therapy or pharmacologic therapy may be warranted, if clinically indicated. 5. Additional incidental findings,    # Patient has a history of melanoma on his  neck, treated in 2009. # History of hemorrhagic infarct left cerebellum  INTERVAL HISTORY CORINTHIAN MIZRAHI is a 72 y.o. male who has above history reviewed by me today presents for follow up visit for management of  Stage IV lung adenocarcianoma # Pulmonary embolism, SOB is stable, not worse. Off Eliquis due to MRI findings.  + Insomnia and nocturia. On Ativan QHS, not helping - he takes frequent naps during the day + Lower extremity swelling, started on Lasix, swelling has improved in calf, still some swelling in his feet.  + Feeling weak.  Loss of appetite. Wife reports that patient is in bed all day long.  + back pain, on fentanyl patch and percocet, pain is relieved at rest, but still have pain when he moves around or bends down  Accompanied by wife.    Review of Systems  Constitutional:  Positive for fatigue. Negative for appetite change, chills, fever and unexpected weight change.  HENT:   Negative for hearing loss and voice change.   Eyes:  Negative for eye problems and icterus.  Respiratory:  Positive for shortness of breath. Negative for chest tightness and cough.   Cardiovascular:  Positive for leg swelling. Negative for chest pain.  Gastrointestinal:  Negative for abdominal distention, abdominal pain and blood in stool.  Endocrine: Negative for hot flashes.  Genitourinary:  Negative for difficulty urinating, dysuria and frequency.   Musculoskeletal:  Positive for back pain. Negative for arthralgias.  Skin:  Negative for itching and rash.  Neurological:  Negative for extremity weakness, headaches, light-headedness and numbness.  Hematological:  Negative for adenopathy. Does not bruise/bleed easily.  Psychiatric/Behavioral:  Positive for sleep disturbance. Negative for confusion.     MEDICAL HISTORY:  Past Medical History:  Diagnosis Date   Allergy    BPH (benign prostatic hypertrophy)    Cancer (HCC)    Melanoma on Neck    2008   Carotid artery occlusion    Diabetes  mellitus    type 2   ED (erectile dysfunction)    GERD (gastroesophageal reflux disease)    Hemorrhagic stroke ([place]) 06/2021   Hyperlipidemia    Hypertension    Lung cancer ([place])    Neoplasm related pain    Retinopathy due to secondary DM ([place])     SURGICAL HISTORY: Past Surgical History:  Procedure Laterality Date   BRONCHIAL NEEDLE ASPIRATION BIOPSY  06/17/2021   Procedure: BRONCHIAL NEEDLE ASPIRATION BIOPSIES;  Surgeon: Candee Furbish, MD;  Location: [facility];  Service: Pulmonary;;   IR IMAGING GUIDED PORT INSERTION  08/05/2021   MELANOMA EXCISION  2008   Left side of neck   RADIOLOGY WITH ANESTHESIA N/A 04/05/2020   Procedure: MRI SPINE WITOUT  CONTRAST;  Surgeon: Radiologist, Medication, MD;  Location: [facility];  Service: Radiology;  Laterality: N/A;   RADIOLOGY WITH ANESTHESIA N/A 08/22/2021   Procedure: MRI BRAIN WITH AND WITHOUT CONTRAST  WITH ANESTHESIA;  Surgeon: Radiologist, Medication, MD;  Location: [facility];  Service: Radiology;  Laterality: N/A;   RADIOLOGY WITH ANESTHESIA N/A 12/17/2021   Procedure: MRI BRAIN WITH AND WITHOUT CONTRAST WITH ANESTHESIA; MRI LUMBER WITH AND WITHOUT CONTRAST;  Surgeon: Radiologist, Medication, MD;  Location: [facility];  Service: Radiology;  Laterality: N/A;   TONSILLECTOMY     VIDEO BRONCHOSCOPY WITH ENDOBRONCHIAL ULTRASOUND N/A 06/17/2021   Procedure: VIDEO BRONCHOSCOPY WITH ENDOBRONCHIAL ULTRASOUND;  Surgeon: Candee Furbish, MD;  Location: [facility] ENDOSCOPY;  Service: Pulmonary;  Laterality: N/A;    SOCIAL HISTORY: Social History   Socioeconomic History   Marital status: Married    Spouse name: Not on file   Number of children: Not on file   Years of education: Not on file   Highest education level: Not on file  Occupational History   Not on file  Tobacco Use   Smoking status: Former    Types: Cigarettes    Quit date: 07/21/1990    Years since quitting: 31.7   Smokeless tobacco: Never  Vaping Use   Vaping Use: Never used  Substance and  Sexual Activity   Alcohol use: No   Drug use: No   Sexual activity: Not on file  Other Topics Concern   Not on file  Social History Narrative   Not on file   Social Determinants of Health   Financial Resource Strain: Low Risk  (03/27/2022)   Overall Financial Resource Strain (CARDIA)    Difficulty of Paying Living Expenses: Not hard at all  Food Insecurity: No Food Insecurity (03/27/2022)   Hunger Vital Sign    Worried About Running Out of Food in the Last Year: Never true    [place] in the Last Year: Never true  Transportation Needs: No Transportation Needs (03/27/2022)   PRAPARE - Hydrologist (Medical): No    Lack of Transportation (Non-Medical): No  Physical Activity: Inactive (03/27/2022)   Exercise Vital Sign    Days of Exercise per Week: 0 days    Minutes of Exercise per Session: 0 min  Stress: Stress Concern Present (03/27/2022)   [place]    Feeling of Stress : To some extent  Social Connections: Moderately Isolated (03/27/2022)   Social Connection and Isolation Panel [NHANES]    Frequency of Communication with Friends and Family: More than three times a week    Frequency of Social Gatherings with Friends and Family: Three times a week    Attends Religious Services: Never    Active Member of Clubs or Organizations: No    Attends Archivist Meetings: Never    Marital Status: Married  Human resources officer Violence: Not At Risk (03/27/2022)   Humiliation, Afraid, Rape, and Kick questionnaire    Fear of Current or Ex-Partner: No    Emotionally Abused: No    Physically Abused: No    Sexually Abused: No    FAMILY HISTORY: Family History  Problem Relation Age of Onset   COPD Mother    Heart disease Father    Heart disease Brother        MI at age 35   Stroke Neg Hx     ALLERGIES:  is allergic to niaspan [niacin].  MEDICATIONS:  Current Outpatient  Medications  Medication Sig Dispense Refill   aspirin EC 81 MG tablet Take 1 tablet (81 mg total) by mouth daily. Swallow whole. 30 tablet 12   atorvastatin (LIPITOR) 40 MG tablet Take 1 tablet (40 mg total) by mouth daily. 60 tablet 2   Ca Phosphate-Cholecalciferol (CALCIUM WITH D3 PO) Take 1 capsule by mouth daily.     cyclobenzaprine (FLEXERIL) 5 MG tablet Take 1 tablet (5 mg total) by mouth 2 (two) times daily as needed for muscle spasms. 60 tablet 1   dronabinol (MARINOL) 5 MG capsule Take 1 capsule (5 mg total) by mouth in the morning and at bedtime. 60 capsule 0   folic acid (FOLVITE) 626 MCG tablet Take 1 tablet (800 mcg total) by mouth daily. 60 tablet 1   gabapentin (NEURONTIN) 100 MG capsule Take 1 capsule (100 mg total) by mouth 3 (three) times daily. 90 capsule 0   gentamicin (GARAMYCIN) 0.3 % ophthalmic solution Place 1 drop into both eyes 3 (three) times daily. 5 mL 0   Iron-Vitamin C 65-125 MG TABS Take 1 tablet by mouth daily. 30 tablet 1   NOVOLOG FLEXPEN 100 UNIT/ML FlexPen Inject 0-10 Units into the skin daily as needed for high blood sugar (above 200). Sliding scale     omeprazole (PRILOSEC) 20 MG capsule Take 20 mg by mouth daily.     oxyCODONE-acetaminophen (PERCOCET) 7.5-325 MG tablet Take 1 tablet by mouth every 4 (four) hours as needed for severe pain. Do not take with cough medication or other pain medication 60 tablet 0   tamsulosin (FLOMAX) 0.4 MG CAPS capsule TAKE 1 CAPSULE(0.4 MG) BY MOUTH DAILY 90 capsule 0   traZODone (DESYREL) 50 MG tablet Take 1 tablet (50 mg total) by mouth at bedtime as needed for sleep. 30 tablet 0   UNABLE TO FIND PLEASE CHECK FASTING BLOOD GLUCOSE EVERY MORNING 1 each 0   valsartan (DIOVAN) 80 MG tablet Take 1 tablet (80 mg total) by mouth daily. 90 tablet 3   fentaNYL (DURAGESIC) 50 MCG/HR Place 1 patch onto the skin every 3 (three) days. 10 patch 0   furosemide (LASIX) 20 MG tablet Take 1 tablet (20 mg total) by mouth daily as needed (leg  swelling or sob). (Patient not taking: Reported on 04/04/2022) 30 tablet 3   HYDROcodone bit-homatropine (HYCODAN) 5-1.5 MG/5ML syrup Take 5 mLs by mouth every 8 (eight) hours as needed for cough. (Patient not taking: Reported on 02/25/2022) 120 mL 0   lidocaine (LIDODERM) 5 % Place 1 patch onto the skin daily. Remove & Discard patch within 12 hours or as directed by MD (Patient not taking: Reported on 03/27/2022) 30 patch 0   LORazepam (ATIVAN) 1 MG tablet Take 1 tablet (1 mg total) by mouth at bedtime. (Patient not taking: Reported on 03/25/2022) 30 tablet 2   triamcinolone ointment (KENALOG) 0.5 % Apply 1 Application topically 2 (two) times daily. (Patient not taking: Reported on 03/25/2022) 30 g 0   No current facility-administered medications for this visit.   Facility-Administered Medications Ordered in Other Visits  Medication Dose Route Frequency Provider Last Rate Last Admin   heparin lock flush 100 UNIT/ML injection              PHYSICAL EXAMINATION:  Vitals:   04/04/22 1037  BP: 95/61  Pulse: 91  Resp: 18  Temp: 97.6 F (36.4 C)   Filed Weights   04/04/22 1037  Weight: 153 lb 8 oz (69.6 kg)  Physical Exam Constitutional:      General: He is not in acute distress.    Appearance: He is ill-appearing.  HENT:     Head: Normocephalic and atraumatic.  Eyes:     General: No scleral icterus. Cardiovascular:     Rate and Rhythm: Normal rate and regular rhythm.     Heart sounds: Normal heart sounds.  Pulmonary:     Effort: Pulmonary effort is normal. No respiratory distress.     Breath sounds: No wheezing.     Comments: Decreased breath sound bilaterally. Abdominal:     General: Bowel sounds are normal. There is no distension.     Palpations: Abdomen is soft.  Musculoskeletal:        General: No deformity. Normal range of motion.     Cervical back: Normal range of motion and neck supple.     Comments: Bilateral ankle swelling, right >left.   Skin:    General: Skin is  warm and dry.     Findings: No erythema or rash.  Neurological:     Mental Status: He is alert and oriented to person, place, and time. Mental status is at baseline.     Cranial Nerves: No cranial nerve deficit.     Coordination: Coordination normal.  Psychiatric:        Mood and Affect: Mood normal.     LABORATORY DATA:  I have reviewed the data as listed    Latest Ref Rng & Units 04/04/2022   10:07 AM 03/25/2022    3:00 PM 03/18/2022   10:08 AM  CBC  WBC 4.0 - 10.5 K/uL 13.2  21.5  44.7   Hemoglobin 13.0 - 17.0 g/dL 8.6  9.6  7.7   Hematocrit 39.0 - 52.0 % 25.5  28.1  23.2   Platelets 150 - 400 K/uL 355  360  444       Latest Ref Rng & Units 04/04/2022   10:07 AM 03/25/2022    3:00 PM 03/18/2022   10:08 AM  CMP  Glucose 70 - 99 mg/dL 176  167  285   BUN 8 - 23 mg/dL 16  14  18    Creatinine 0.61 - 1.24 mg/dL 1.40  1.47  1.85   Sodium 135 - 145 mmol/L 135  131  129   Potassium 3.5 - 5.1 mmol/L 3.6  3.3  3.3   Chloride 98 - 111 mmol/L 101  96  93   CO2 22 - 32 mmol/L 24  23  22    Calcium 8.9 - 10.3 mg/dL 8.8  9.0  8.0   Total Protein 6.5 - 8.1 g/dL 6.1  6.4  6.4   Total Bilirubin 0.3 - 1.2 mg/dL 0.3  0.3  0.2   Alkaline Phos 38 - 126 U/L 112  135  166   AST 15 - 41 U/L 30  42  50   ALT 0 - 44 U/L 14  28  36      Iron/TIBC/Ferritin/ %Sat    Component Value Date/Time   IRON 60 02/03/2022 0958   TIBC 391 02/03/2022 0958   FERRITIN 704 (H) 02/03/2022 0958   IRONPCTSAT 15 (L) 02/03/2022 0272      RADIOGRAPHIC STUDIES: I have personally reviewed the radiological images as listed and agreed with the findings in the report. DG Lumbar Spine Complete  Result Date: 03/22/2022 CLINICAL DATA:  Back pain, history of non-small cell carcinoma LEFT upper lobe, no injury, pain across lower back off and on for  10 years EXAM: LUMBAR SPINE - COMPLETE 4+ VIEW COMPARISON:  CT abdomen and pelvis 01/27/2022 FINDINGS: Osseous demineralization. Five non-rib-bearing lumbar vertebra. Multilevel  disc space narrowing and endplate spur formation. Superior endplate compression deformity of L2 vertebral body with mild anterior height loss, unchanged since 01/27/2022. No additional fracture, subluxation, or bone destruction. Atherosclerotic calcifications aorta. Increased stool throughout proximal half of colon. IMPRESSION: Chronic superior endplate compression deformity of L2 vertebral body. Osseous demineralization with multilevel degenerative disc disease changes. Increased stool in proximal colon. Aortic Atherosclerosis (ICD10-I70.0). Electronically Signed   By: Lavonia Dana M.D.   On: 03/22/2022 21:44   CT Lumbar Spine Wo Contrast  Result Date: 03/20/2022 CLINICAL DATA:  Metastatic disease to lower back. Back pain. Evaluate compression fracture for possible kyphoplasty/RFA. History of lung cancer and melanoma. EXAM: CT LUMBAR SPINE WITHOUT CONTRAST TECHNIQUE: Multidetector CT imaging of the lumbar spine was performed without intravenous contrast administration. Multiplanar CT image reconstructions were also generated. RADIATION DOSE REDUCTION: This exam was performed according to the departmental dose-optimization program which includes automated exposure control, adjustment of the mA and/or kV according to patient size and/or use of iterative reconstruction technique. COMPARISON:  MRI lumbar spine 12/17/2021 and 04/05/2020. Abdominopelvic CT 11/18/2021 and 01/27/2022. PET-CT 10/29/2021 and 07/24/2021. FINDINGS: Segmentation: There are 5 lumbar type vertebral bodies. Alignment: Chronic straightening without focal angulation or significant anterolisthesis. There is a stable chronic retrolisthesis at L5-S1. Vertebrae: Chronic pathologic fractures involving the superior endplates of L2 and L5 are unchanged from the CT of 11/18/2021 and lumbar MRI 12/17/2021. No progressive loss of height at either level. No new fracture or progressive metastatic disease demonstrated. Grossly stable node metastatic disease  in the visualized upper pelvis without pathologic fracture. Paraspinal and other soft tissues: No acute paraspinal findings. There is aortic and branch vessel atherosclerosis. Diffuse subcutaneous edema noted in the back without focal fluid collection. Disc levels: L1-2: Disc bulging with vacuum disc phenomenon. No significant spinal stenosis. L2-3: Mild disc bulging with endplate osteophytes. No significant spinal stenosis. L3-4: Loss of disc height with disc bulging and endplate osteophytes asymmetric to the left. Mild facet and ligamentous hypertrophy. Stable mild asymmetric left lateral recess and left foraminal narrowing. L4-5: Chronic pathologic fracture/Schmorl's node in the superior endplate of L5. Disc desiccation with annular bulging, facet and ligamentous hypertrophy. Resulting mild multifactorial spinal stenosis with mild lateral recess and foraminal narrowing bilaterally, unchanged from most recent MRI. L5-S1: Chronic degenerative disc disease with loss of disc height, disc bulging, endplate osteophytes and vacuum phenomenon. Chronic mild to moderate foraminal narrowing bilaterally appears unchanged. IMPRESSION: 1. Stable appearance of the lumbar spine compared with most recent MRI of 12/17/2021 and CT of 11/18/2021. 2. Chronic pathologic fractures in the superior endplates of L2 and L5 are unchanged. No progressive loss of height. No new fracture or progressive metastatic disease demonstrated. 3. Multilevel spondylosis as described, similar to most recent MRI. 4.  Aortic Atherosclerosis (ICD10-I70.0). Electronically Signed   By: Richardean Sale M.D.   On: 03/20/2022 16:32

## 2022-04-05 NOTE — Assessment & Plan Note (Signed)
B12 level has improved.

## 2022-04-05 NOTE — Assessment & Plan Note (Signed)
Back pain, MRI lumbar showed compression fracture Continue PRN percocet, recently added fentanyl patch recommend Flexeril  I discussed with palliative care service [name] who will see him today as. Well. I agree with repeating MRI lumbar, possible IR eval for kyphoplasty

## 2022-04-07 ENCOUNTER — Encounter: Payer: PPO | Admitting: Pharmacist

## 2022-04-07 ENCOUNTER — Encounter (HOSPITAL_COMMUNITY): Payer: Self-pay | Admitting: *Deleted

## 2022-04-07 ENCOUNTER — Encounter: Payer: Self-pay | Admitting: Oncology

## 2022-04-07 ENCOUNTER — Telehealth: Payer: Self-pay

## 2022-04-07 DIAGNOSIS — H0288B Meibomian gland dysfunction left eye, upper and lower eyelids: Secondary | ICD-10-CM | POA: Diagnosis not present

## 2022-04-07 DIAGNOSIS — H16143 Punctate keratitis, bilateral: Secondary | ICD-10-CM | POA: Diagnosis not present

## 2022-04-07 DIAGNOSIS — H04123 Dry eye syndrome of bilateral lacrimal glands: Secondary | ICD-10-CM | POA: Diagnosis not present

## 2022-04-07 DIAGNOSIS — H16223 Keratoconjunctivitis sicca, not specified as Sjogren's, bilateral: Secondary | ICD-10-CM | POA: Diagnosis not present

## 2022-04-07 DIAGNOSIS — H0288A Meibomian gland dysfunction right eye, upper and lower eyelids: Secondary | ICD-10-CM | POA: Diagnosis not present

## 2022-04-07 NOTE — Progress Notes (Incomplete)
Care Management & Coordination Services Pharmacy Note  04/07/2022 Name:  Micheal Donovan MRN:  [id number] DOB:  Oct 03, 1950  Summary: ***  Recommendations/Changes made from today's visit: ***  Follow up plan: ***   Subjective: Micheal Donovan is an 72 y.o. year old male who is a primary patient of Pickard, Cammie Mcgee, MD.  The care coordination team was consulted for assistance with disease management and care coordination needs.    Engaged with patient by telephone for initial visit.  Recent office visits:  03/07/2022 OV (PCP) [name], MD; will start valsartan 80 mg a day for treatment of heart failure. I will defer adding Jardiance to his follow-up appointment with his cardiologist.   start him on fentanyl 25 mcg transdermal every 72 hours to try to get better control of his pain. He can use Percocet for breakthrough pain.    02/14/2022 OV (PCP) [name], MD; I gave the patient Lasix today 20 mg daily as needed for swelling. I recommended that he take his weight every day and if he gains more than 2 pounds in 24 hours he use Lasix. We also discussed treatment for congestive heart failure including beta-blockers, ARB, Entresto, spironolactone, and SGLT2 inhibitors. I suggested that we start the patient on Entresto. However the patient would like to discuss the situation with his oncologist and see a cardiologist for a second opinion. Therefore I will arrange cardiology consultation.    02/11/2022 OV (PCP) [name], MD; no medication changes indicated.   01/29/2022 OV (PCP) [name], MD; I gave the patient a sliding scale for rapid acting insulin as I anticipate that the prednisone will likely exacerbate his glycemic control. Sliding scale starts at 150 and prescribes 2 units of insulin for every 50 points greater than 150.    01/01/2022 OV (PCP) [name], MD; I given the patient a prednisone taper pack and affect to mimic a cortisone injection in his  lower back.    12/10/2021 OV (PCP) [name], MD; I will give the patient lorazepam 1 mg p.o. nightly to try to help with his insomnia. Consider also trying something like Vesicare to help with overactive bladder to maybe reduce some of the nocturnal frequency    Recent consult visits:  03/26/2022 OV (Oncology) Borders, Kirt Boys, NP; Conjunctivitis -start gentamicin ophthalmic solution.   03/25/2022 OV (Oncology) Verlon Au, NP; no office note available.   03/25/2022 OV (Cardiology) Janina Mayo, MD; no medication changes indicated.   03/18/2022 OV (Oncology) Borders, Kirt Boys, NP; Start PRN trazodone    03/18/2022 OV (Oncology) [name], MD; no medication changes noted.   02/25/2022 OV (Oncology) [name], MD; Currently on 60 mg of prednisone. Glucoses trending higher. I recommend patient to taper down to 20 mg daily for 3 days followed by 10 mg for 3 days and then stop.    02/21/2022 OV (Oncology) Hughie Closs, PA-C; . For now he will continue on his doxycycline, start his prednisone and continue the strong topical steroid prescribed by his dermatologist    02/21/2022 OV (Oncology) Ventura Sellers, MD; no medication changes noted.   02/19/2022 OV (Oncology) Hughie Closs, PA-C; Worsening per patient even on doxycyline (though has only been on this medication for less than 48 hours). May be secondary to his Keytruda. Starting on prednisone and topical steroids.    02/17/2022 OV (Oncology) Hughie Closs, PA-C; For now doxycyline x 10 days.   02/13/2022  OV (Oncology) [name], MD;  Recommend daily Zarxio 343mcg x2 , I recommend him to take Claritin 10mg  daily x 4 days.  Stop Eliquis 2.5mg  BID.    02/03/2022 OV (Oncology) [name], MD; Proceed with Alimta/Keytruda today , Hold Eliquis. Refer to neurology.   01/13/2022 OV (Oncology) [name], MD; Stop Eliquis 2.5mg  BID. Recommend Zometa every 4- 6 weeks. Recommend calcium supplementation.   12/23/2021 OV  (Oncology) [name], MD; Stop Eliquis 2.5mg  BID. Recommend Zometa every 4- 6 weeks. Recommend calcium supplementation.   12/02/2021 OV (Oncology) [name], MD; Recommend Zometa every 4- 6 weeks. Recommend calcium supplementation.   11/11/2021 OV (Oncology) [name], MD; Hypokalemia, recommend potassium supplementation 28meq daily Recommend Zometa every 4- 6 weeks. Recommend calcium supplementation. Recommend trial of Flowmax, if no improvement, will refer to urology.   11/01/2021 OV (Oncology) Borders, Kirt Boys, NP;  recommended Dulcolax daily.   10/21/2021 OV (Oncology) [name], MD;  Eliquis is decreased to 2.5mg  BID    Hospital visits:  12/17/2021 MRI BRAIN WITH AND WITHOUT CONTRAST WITH ANESTHESIA   11/18/2021 ED to Hospital Admission due to Neutropenic fever -No medication changes indicated.    Objective:  Lab Results  Component Value Date   CREATININE 1.40 (H) 04/04/2022   BUN 16 04/04/2022   EGFR 74 02/11/2022   GFRNONAA 54 (L) 04/04/2022   GFRAA >89 03/05/2016   NA 135 04/04/2022   K 3.6 04/04/2022   CALCIUM 8.8 (L) 04/04/2022   CO2 24 04/04/2022   GLUCOSE 176 (H) 04/04/2022    Lab Results  Component Value Date/Time   HGBA1C 7.7 (H) 12/10/2021 02:20 PM   HGBA1C 13.6 (H) 06/10/2021 12:10 PM   MICROALBUR 0.9 03/06/2016 10:10 AM    Last diabetic Eye exam:  Lab Results  Component Value Date/Time   HMDIABEYEEXA Retinopathy (A) 07/20/2015 11:31 AM    Last diabetic Foot exam: No results found for: "HMDIABFOOTEX"   Lab Results  Component Value Date   CHOL 266 (H) 06/12/2021   HDL 24 (L) 06/12/2021   LDLCALC 213 (H) 06/12/2021   TRIG 144 06/12/2021   CHOLHDL 11.1 06/12/2021       Latest Ref Rng & Units 04/04/2022   10:07 AM 03/25/2022    3:00 PM 03/18/2022   10:08 AM  Hepatic Function  Total Protein 6.5 - 8.1 g/dL 6.1  6.4  6.4   Albumin 3.5 - 5.0 g/dL 2.8  3.0  2.9   AST 15 - 41 U/L 30  42  50   ALT 0 - 44 U/L 14  28  36   Alk Phosphatase 38 - 126 U/L 112  135   166   Total Bilirubin 0.3 - 1.2 mg/dL 0.3  0.3  0.2     Lab Results  Component Value Date/Time   TSH 1.438 02/03/2022 09:58 AM   TSH 1.783 12/02/2021 01:05 PM   TSH 1.02 07/10/2015 08:08 AM   TSH 1.131 06/19/2014 08:08 AM   FREET4 0.62 10/21/2021 08:16 AM   FREET4 0.64 09/27/2021 08:16 AM       Latest Ref Rng & Units 04/04/2022   10:07 AM 03/25/2022    3:00 PM 03/18/2022   10:08 AM  CBC  WBC 4.0 - 10.5 K/uL 13.2  21.5  44.7   Hemoglobin 13.0 - 17.0 g/dL 8.6  9.6  7.7   Hematocrit 39.0 - 52.0 % 25.5  28.1  23.2   Platelets 150 - 400 K/uL 355  360  444  Lab Results  Component Value Date/Time   VD25OH 35.27 06/21/2021 05:05 AM   VD25OH 58 07/13/2012 08:25 AM   VITAMINB12 3,412 (H) 03/18/2022 10:08 AM   VITAMINB12 290 01/13/2022 09:02 AM    Clinical ASCVD: {YES/NO:21197} The 10-year ASCVD risk score (Arnett DK, et al., 2019) is: 36.6%   Values used to calculate the score:     Age: 50 years     Sex: Male     Is Non-Hispanic African American: No     Diabetic: Yes     Tobacco smoker: No     Systolic Blood Pressure: 95 mmHg     Is BP treated: Yes     HDL Cholesterol: 24 mg/dL     Total Cholesterol: 266 mg/dL    ***Other: (CHADS2VASc if Afib, MMRC or CAT for COPD, ACT, DEXA)     03/27/2022    3:29 PM 02/11/2022    2:50 PM 01/29/2022    9:00 AM  Depression screen PHQ 2/9  Decreased Interest 0 0 0  Down, Depressed, Hopeless 0 0 0  PHQ - 2 Score 0 0 0  Altered sleeping  0 0  Tired, decreased energy  0 0  Change in appetite  0 0  Feeling bad or failure about yourself   0 0  Trouble concentrating  0 0  Moving slowly or fidgety/restless  0 0  Suicidal thoughts  0 0  PHQ-9 Score  0 0  Difficult doing work/chores  Not difficult at all Not difficult at all     Social History   Tobacco Use  Smoking Status Former  . Types: Cigarettes  . Quit date: 07/21/1990  . Years since quitting: 31.7  Smokeless Tobacco Never   BP Readings from Last 3 Encounters:  04/04/22  95/61  03/27/22 92/60  03/25/22 (!) 75/54   Pulse Readings from Last 3 Encounters:  04/04/22 91  03/25/22 96  03/25/22 91   Wt Readings from Last 3 Encounters:  04/04/22 153 lb 8 oz (69.6 kg)  03/27/22 150 lb (68 kg)  03/25/22 147 lb (66.7 kg)   BMI Readings from Last 3 Encounters:  04/04/22 24.04 kg/m  03/27/22 23.49 kg/m  03/25/22 23.02 kg/m    Allergies  Allergen Reactions  . Niaspan [Niacin]     FLUSHING    Medications Reviewed Today     Reviewed by Berneta Sages, RN (Registered Nurse) on 04/07/22 at [facility] List Status: F/U phone call - Mon   Medication Order Taking? Sig Documenting Provider Last Dose Status Informant  aspirin EC 81 MG tablet [id number]  Take 1 tablet (81 mg total) by mouth daily. Swallow whole. Ventura Sellers, MD  Active   atorvastatin (LIPITOR) 40 MG tablet [id number]  Take 1 tablet (40 mg total) by mouth daily. Ventura Sellers, MD  Active   Ca Phosphate-Cholecalciferol (CALCIUM WITH D3 PO) [id number]  Take 1 capsule by mouth daily. [provider]  Active   cyclobenzaprine (FLEXERIL) 5 MG tablet [id number]  Take 1 tablet (5 mg total) by mouth 2 (two) times daily as needed for muscle spasms. [name], MD  Active            Med Note Valere Dross, Cyndee Brightly Apr 07, 2022  9:24 AM) Patient is not currently taking this medication.  dronabinol (MARINOL) 5 MG capsule [id number]  Take 1 capsule (5 mg total) by mouth in the morning and at bedtime. [name], MD  Active  Med Note Guadlupe Spanish Apr 07, 2022  9:25 AM) Patient is not currently taking this medication.  fentaNYL (DURAGESIC) 50 MCG/HR [id number]  Place 1 patch onto the skin every 3 (three) days. [name], MD  Active   folic acid (FOLVITE) 801 MCG tablet [id number]  Take 1 tablet (800 mcg total) by mouth daily. [name], MD  Consider Medication Status and Discontinue (Patient Preference)   furosemide (LASIX) 20 MG tablet [id number]  Take 1 tablet (20 mg total) by mouth  daily as needed (leg swelling or sob).  Patient not taking: Reported on 04/04/2022   [name], MD  Active   gabapentin (NEURONTIN) 100 MG capsule [id number]  Take 1 capsule (100 mg total) by mouth 3 (three) times daily. [name], MD  Active   gentamicin (GARAMYCIN) 0.3 % ophthalmic solution [id number]  Place 1 drop into both eyes 3 (three) times daily. Borders, Kirt Boys, NP  Active   HYDROcodone bit-homatropine (HYCODAN) 5-1.5 MG/5ML syrup [id number]  Take 5 mLs by mouth every 8 (eight) hours as needed for cough.  Patient not taking: Reported on 02/25/2022   [name], MD  Active   Iron-Vitamin C 65-125 MG TABS [id number]  Take 1 tablet by mouth daily. [name], MD  Active            Med Note Valere Dross, Cyndee Brightly Apr 07, 2022  9:27 AM) Patient has not filled the rx as of today, waiting on insurance   lidocaine (LIDODERM) 5 % [id number]  Place 1 patch onto the skin daily. Remove & Discard patch within 12 hours or as directed by MD  Patient not taking: Reported on 03/27/2022   Borders, Kirt Boys, NP  Active            Med Note Valere Dross, Cyndee Brightly Apr 07, 2022  9:28 AM) Patient has not filled the rx as of today, waiting on insurance   LORazepam (ATIVAN) 1 MG tablet [id number]  Take 1 tablet (1 mg total) by mouth at bedtime.  Patient not taking: Reported on 03/25/2022   [name], MD  Active   NOVOLOG FLEXPEN 100 UNIT/ML FlexPen [id number]  Inject 0-10 Units into the skin daily as needed for high blood sugar (above 200). Sliding scale [provider]  Active Self  omeprazole (PRILOSEC) 20 MG capsule [id number]  Take 20 mg by mouth daily. [provider]  Active Self  oxyCODONE-acetaminophen (PERCOCET) 7.5-325 MG tablet [id number]  Take 1 tablet by mouth every 4 (four) hours as needed for severe pain. Do not take with cough medication or other pain medication [name], MD  Active     Discontinued 08/06/21 1639   tamsulosin (FLOMAX) 0.4 MG CAPS capsule [id number]  TAKE  1 CAPSULE(0.4 MG) BY MOUTH DAILY [name], MD  Active Self  traZODone (DESYREL) 50 MG tablet [id number]  Take 1 tablet (50 mg total) by mouth at bedtime as needed for sleep. Borders, Kirt Boys, NP  Active   triamcinolone ointment (KENALOG) 0.5 % [id number]  Apply 1 Application topically 2 (two) times daily.  Patient not taking: Reported on 03/25/2022   Vallery Sa  Active   UNABLE TO FIND [id number]  PLEASE CHECK FASTING BLOOD GLUCOSE EVERY MORNING [name], MD  Active Self  valsartan (DIOVAN) 80 MG tablet [id number]  Take 1 tablet (80 mg total) by mouth daily. [name], MD  Active  SDOH:  (Social Determinants of Health) assessments and interventions performed: {yes/no:20286} SDOH Interventions    Flowsheet Row Clinical Support from 03/27/2022 in [facility] Telephone from 11/21/2021 in [facility] from 07/31/2021 in [facility] at [place] Interventions Intervention Not Indicated -- Intervention Not Indicated  Housing Interventions Intervention Not Indicated -- Intervention Not Indicated  Transportation Interventions Intervention Not Indicated Intervention Not Indicated CCAR Lucianne Lei ([place]. Only), Patient Resources (Friends/Family)  Utilities Interventions Intervention Not Indicated -- --  Alcohol Usage Interventions Intervention Not Indicated (Score <7) -- --  Financial Strain Interventions Intervention Not Indicated Intervention Not Indicated Intervention Not Indicated  Physical Activity Interventions Intervention Not Indicated -- Intervention Not Indicated  Stress Interventions Intervention Not Indicated -- --  Social Connections Interventions Intervention Not Indicated -- Intervention Not Indicated       Medication Assistance: {MEDASSISTANCEINFO:25044}  Medication Access: Within the past  30 days, how often has patient missed a dose of medication? *** Is a pillbox or other method used to improve adherence? {YES/NO:21197} Factors that may affect medication adherence? {CHL DESC; BARRIERS:21522} Are meds synced by current pharmacy? {YES/NO:21197} Are meds delivered by current pharmacy? {YES/NO:21197} Does patient experience delays in picking up medications due to transportation concerns? {YES/NO:21197}  [facility] Reviewed: Is patient disadvantaged to use [facility]?: {YES/NO:21197} Current Rx insurance plan: *** Name and location of Current pharmacy:  [facility] [place] [address] Phone: [phone] Fax: [phone]  [facility] services reviewed with patient today?: {YES/NO:21197} Patient requests to transfer care to [facility]?: {YES/NO:21197} Reason patient declined to change pharmacies: {US patient preference:27474}  Star Rating Drugs:  Atorvastatin 40 mg last filled 02/21/2022 90 DS Lantus last filled 03/27/2022 44 DS Valsartan 80 mg last filled 03/07/2022 90 DS   Care Gaps: Annual wellness visit in last year? No   Assessment/Plan   Hypertension (BP goal {CHL HP [facility] Pharmacist BP [phone]}) -{US controlled/uncontrolled:25276} -Current treatment: *** -Medications previously tried: ***  -Current home readings: *** -Current dietary habits: *** -Current exercise habits: *** -{ACTIONS;DENIES/REPORTS:21021675::"Denies"} hypotensive/hypertensive symptoms -Educated on {CCM BP Counseling:25124} -Counseled to monitor BP at home ***, document, and provide log at future appointments -{CCMPHARMDINTERVENTION:25122}  Hyperlipidemia: (LDL goal < ***) -{US controlled/uncontrolled:25276} -Current treatment: *** -Medications previously tried: ***  -Current dietary patterns: *** -Current exercise habits:  *** -Educated on {CCM HLD Counseling:25126} -{CCMPHARMDINTERVENTION:25122}  Diabetes (A1c goal {A1c goals:23924}) -{US controlled/uncontrolled:25276} -Current medications: *** -Medications previously tried: ***  -Current home glucose readings fasting glucose: *** post prandial glucose: *** -{ACTIONS;DENIES/REPORTS:21021675::"Denies"} hypoglycemic/hyperglycemic symptoms -Current meal patterns:  breakfast: ***  lunch: ***  dinner: *** snacks: *** drinks: *** -Current exercise: *** -Educated on {CCM DM COUNSELING:25123} -Counseled to check feet daily and get yearly eye exams -{CCMPHARMDINTERVENTION:25122}  Lung Cancer (Goal: ***) -{US controlled/uncontrolled:25276} -Current treatment  *** -Medications previously tried: ***  -{CCMPHARMDINTERVENTION:25122}  ***

## 2022-04-07 NOTE — Telephone Encounter (Signed)
medication approved from 04/04/2022 through 04/08/23

## 2022-04-07 NOTE — Telephone Encounter (Signed)
Detailed VM left with ph number for any questions. Mychart message also sent.

## 2022-04-07 NOTE — Progress Notes (Signed)
PCP - Dr Jenna Luo Cardiologist - Dr Phineas Inches Oncology - Dr [name]  Chest x-ray - 02/13/22 EKG - 03/25/22 Stress Test - n/a ECHO - 06/19/21 Cardiac Cath - n/a  ICD Pacemaker/Loop - n/a  Sleep Study -  n/a CPAP - none  Diabetes Type 2 Do not take Novolog Insulin on the morning of surgery unless your CBG is >220mg /dL.  If CBG is >220mg /dL, you may take 1/2 of your sliding scale (correction) dose of insulin.  Take 1/2 Lantus dose on DOS PRN.  If your blood sugar is less than 70 mg/dL, you will need to treat for low blood sugar: Treat a low blood sugar (less than 70 mg/dL) with  cup of clear juice (cranberry or apple), 4 glucose tablets, OR glucose gel. Recheck blood sugar in 15 minutes after treatment (to make sure it is greater than 70 mg/dL). If your blood sugar is not greater than 70 mg/dL on recheck, call [phone] for further instructions.  Anesthesia review: Yes  STOP now taking any Aspirin (unless otherwise instructed by your surgeon), Aleve, Naproxen, Ibuprofen, Motrin, Advil, Goody's, BC's, all herbal medications, fish oil, and all vitamins.   Coronavirus Screening Do you have any of the following symptoms:  Cough yes/no: No Fever (>100.20F)  yes/no: No Runny nose yes/no: No Sore throat yes/no: No Difficulty breathing/shortness of breath  yes/no: No  Have you traveled in the last 14 days and where? yes/no: No  Patient verbalized understanding of instructions that were given via phone.

## 2022-04-07 NOTE — Telephone Encounter (Signed)
-----   Message from [name], MD sent at 04/05/2022  3:30 PM EST ----- Please let him know that folate  level is low I recommend folic acid supplementation, 81mcg daily. Rx sent.  If his BP is still low at home, recommend hold off lasix, and his BP meds.

## 2022-04-08 ENCOUNTER — Other Ambulatory Visit: Payer: PPO

## 2022-04-08 ENCOUNTER — Ambulatory Visit: Payer: PPO | Admitting: Oncology

## 2022-04-08 ENCOUNTER — Ambulatory Visit (HOSPITAL_COMMUNITY): Payer: PPO | Admitting: Physician Assistant

## 2022-04-08 ENCOUNTER — Other Ambulatory Visit: Payer: Self-pay

## 2022-04-08 ENCOUNTER — Ambulatory Visit (HOSPITAL_BASED_OUTPATIENT_CLINIC_OR_DEPARTMENT_OTHER): Payer: PPO | Admitting: Physician Assistant

## 2022-04-08 ENCOUNTER — Ambulatory Visit (HOSPITAL_COMMUNITY)
Admission: RE | Admit: 2022-04-08 | Discharge: 2022-04-08 | Disposition: A | Discharge status: Home / Self Care | Payer: PPO | Source: Ambulatory Visit | Attending: Hospice and Palliative Medicine | Admitting: Hospice and Palliative Medicine

## 2022-04-08 ENCOUNTER — Encounter (HOSPITAL_COMMUNITY): Admission: RE | Disposition: A | Discharge status: Home / Self Care | Payer: Self-pay | Source: Home / Self Care

## 2022-04-08 ENCOUNTER — Encounter (HOSPITAL_COMMUNITY): Payer: Self-pay

## 2022-04-08 ENCOUNTER — Ambulatory Visit: Payer: PPO

## 2022-04-08 ENCOUNTER — Ambulatory Visit (HOSPITAL_COMMUNITY)
Admission: RE | Admit: 2022-04-08 | Discharge: 2022-04-08 | Disposition: A | Discharge status: Home / Self Care | Payer: PPO | Attending: Hospice and Palliative Medicine | Admitting: Hospice and Palliative Medicine

## 2022-04-08 DIAGNOSIS — E1151 Type 2 diabetes mellitus with diabetic peripheral angiopathy without gangrene: Secondary | ICD-10-CM | POA: Insufficient documentation

## 2022-04-08 DIAGNOSIS — I959 Hypotension, unspecified: Secondary | ICD-10-CM | POA: Insufficient documentation

## 2022-04-08 DIAGNOSIS — Z87891 Personal history of nicotine dependence: Secondary | ICD-10-CM

## 2022-04-08 DIAGNOSIS — M47816 Spondylosis without myelopathy or radiculopathy, lumbar region: Secondary | ICD-10-CM | POA: Insufficient documentation

## 2022-04-08 DIAGNOSIS — C7951 Secondary malignant neoplasm of bone: Secondary | ICD-10-CM | POA: Diagnosis not present

## 2022-04-08 DIAGNOSIS — I10 Essential (primary) hypertension: Secondary | ICD-10-CM | POA: Insufficient documentation

## 2022-04-08 DIAGNOSIS — D630 Anemia in neoplastic disease: Secondary | ICD-10-CM | POA: Diagnosis not present

## 2022-04-08 DIAGNOSIS — C7931 Secondary malignant neoplasm of brain: Secondary | ICD-10-CM | POA: Diagnosis not present

## 2022-04-08 DIAGNOSIS — C3412 Malignant neoplasm of upper lobe, left bronchus or lung: Secondary | ICD-10-CM

## 2022-04-08 DIAGNOSIS — D539 Nutritional anemia, unspecified: Secondary | ICD-10-CM | POA: Insufficient documentation

## 2022-04-08 DIAGNOSIS — M549 Dorsalgia, unspecified: Secondary | ICD-10-CM | POA: Insufficient documentation

## 2022-04-08 DIAGNOSIS — M7138 Other bursal cyst, other site: Secondary | ICD-10-CM | POA: Insufficient documentation

## 2022-04-08 HISTORY — DX: Heart failure, unspecified: I50.9

## 2022-04-08 HISTORY — PX: RADIOLOGY WITH ANESTHESIA: SHX6223

## 2022-04-08 LAB — GLUCOSE, CAPILLARY
Glucose-Capillary: 139 mg/dL — ABNORMAL HIGH (ref 70–99)
Glucose-Capillary: 146 mg/dL — ABNORMAL HIGH (ref 70–99)

## 2022-04-08 SURGERY — MRI WITH ANESTHESIA
Anesthesia: General

## 2022-04-08 MED ORDER — PHENYLEPHRINE 80 MCG/ML (10ML) SYRINGE FOR IV PUSH (FOR BLOOD PRESSURE SUPPORT)
PREFILLED_SYRINGE | INTRAVENOUS | Status: DC | PRN
  Administered 2022-04-08: 160 ug via INTRAVENOUS
  Administered 2022-04-08: 240 ug via INTRAVENOUS
  Administered 2022-04-08: 80 ug via INTRAVENOUS

## 2022-04-08 MED ORDER — CHLORHEXIDINE GLUCONATE 0.12 % MT SOLN
15.0000 mL | Freq: Once | OROMUCOSAL | Status: AC
  Administered 2022-04-08: 15 mL via OROMUCOSAL
  Filled 2022-04-08: qty 15

## 2022-04-08 MED ORDER — PHENYLEPHRINE HCL-NACL 20-0.9 MG/250ML-% IV SOLN
INTRAVENOUS | Status: DC | PRN
  Administered 2022-04-08: 25 ug/min via INTRAVENOUS

## 2022-04-08 MED ORDER — SUGAMMADEX SODIUM 200 MG/2ML IV SOLN
INTRAVENOUS | Status: DC | PRN
  Administered 2022-04-08: 200 mg via INTRAVENOUS

## 2022-04-08 MED ORDER — GADOBUTROL 1 MMOL/ML IV SOLN
7.0000 mL | Freq: Once | INTRAVENOUS | Status: AC | PRN
  Administered 2022-04-08: 7 mL via INTRAVENOUS

## 2022-04-08 MED ORDER — ROCURONIUM BROMIDE 10 MG/ML (PF) SYRINGE
PREFILLED_SYRINGE | INTRAVENOUS | Status: DC | PRN
  Administered 2022-04-08: 60 mg via INTRAVENOUS

## 2022-04-08 MED ORDER — MIDAZOLAM HCL 2 MG/2ML IJ SOLN
INTRAMUSCULAR | Status: DC | PRN
  Administered 2022-04-08: 2 mg via INTRAVENOUS

## 2022-04-08 MED ORDER — EPHEDRINE SULFATE-NACL 50-0.9 MG/10ML-% IV SOSY
PREFILLED_SYRINGE | INTRAVENOUS | Status: DC | PRN
  Administered 2022-04-08: 5 mg via INTRAVENOUS

## 2022-04-08 MED ORDER — ONDANSETRON HCL 4 MG/2ML IJ SOLN
INTRAMUSCULAR | Status: DC | PRN
  Administered 2022-04-08: 4 mg via INTRAVENOUS

## 2022-04-08 MED ORDER — DEXAMETHASONE SODIUM PHOSPHATE 10 MG/ML IJ SOLN
INTRAMUSCULAR | Status: DC | PRN
  Administered 2022-04-08: 10 mg via INTRAVENOUS

## 2022-04-08 MED ORDER — ONDANSETRON HCL 4 MG/2ML IJ SOLN: 4.0000 mg | Freq: Once | INTRAMUSCULAR | Status: DC | PRN

## 2022-04-08 MED ORDER — PROPOFOL 10 MG/ML IV BOLUS
INTRAVENOUS | Status: DC | PRN
  Administered 2022-04-08: 150 mg via INTRAVENOUS

## 2022-04-08 MED ORDER — ORAL CARE MOUTH RINSE: 15.0000 mL | Freq: Once | OROMUCOSAL | Status: AC

## 2022-04-08 MED ORDER — LACTATED RINGERS IV SOLN: INTRAVENOUS | Status: DC

## 2022-04-08 MED ORDER — LIDOCAINE 2% (20 MG/ML) 5 ML SYRINGE
INTRAMUSCULAR | Status: DC | PRN
  Administered 2022-04-08: 100 mg via INTRAVENOUS

## 2022-04-08 MED ORDER — FENTANYL CITRATE (PF) 250 MCG/5ML IJ SOLN
INTRAMUSCULAR | Status: DC | PRN
  Administered 2022-04-08: 50 ug via INTRAVENOUS

## 2022-04-08 MED ORDER — FENTANYL CITRATE (PF) 100 MCG/2ML IJ SOLN: 25.0000 ug | INTRAMUSCULAR | Status: DC | PRN

## 2022-04-08 NOTE — Anesthesia Procedure Notes (Signed)
Procedure Name: Intubation Date/Time: 04/08/2022 9:57 AM  Performed by: Carolan Clines, CRNAPre-anesthesia Checklist: Patient identified, Emergency Drugs available, Suction available and Patient being monitored Patient Re-evaluated:Patient Re-evaluated prior to induction Oxygen Delivery Method: Circle System Utilized Preoxygenation: Pre-oxygenation with 100% oxygen Induction Type: IV induction Ventilation: Mask ventilation without difficulty Laryngoscope Size: Mac and 4 Grade View: Grade I Tube type: Oral Tube size: 7.5 mm Number of attempts: 1 Airway Equipment and Method: Stylet Placement Confirmation: ETT inserted through vocal cords under direct vision, positive ETCO2 and breath sounds checked- equal and bilateral Secured at: 22 cm Tube secured with: Tape Dental Injury: Teeth and Oropharynx as per pre-operative assessment

## 2022-04-08 NOTE — Anesthesia Postprocedure Evaluation (Signed)
Anesthesia Post Note  Patient: Micheal Donovan  Procedure(s) Performed: MRI LUMBER SPINE WITH AND WITHOUT CONTRAST     Patient location during evaluation: PACU Anesthesia Type: General Level of consciousness: awake and alert Pain management: pain level controlled Vital Signs Assessment: post-procedure vital signs reviewed and stable Respiratory status: spontaneous breathing, nonlabored ventilation, respiratory function stable and patient connected to nasal cannula oxygen Cardiovascular status: blood pressure returned to baseline and stable Postop Assessment: no apparent nausea or vomiting Anesthetic complications: no  No notable events documented.  Last Vitals:  Vitals:   04/08/22 1055 04/08/22 1100  BP: 128/67 (!) 118/56  Pulse: 94 89  Resp: 15 16  Temp: 36.8 C   SpO2: 91% 96%    Last Pain:  Vitals:   04/08/22 1055  TempSrc:   PainSc: Asleep                 Elin Seats S

## 2022-04-08 NOTE — Transfer of Care (Signed)
Immediate Anesthesia Transfer of Care Note  Patient: Micheal Donovan  Procedure(s) Performed: MRI LUMBER SPINE WITH AND WITHOUT CONTRAST  Patient Location: PACU  Anesthesia Type:General  Level of Consciousness: awake, alert , and oriented  Airway & Oxygen Therapy: Patient Spontanous Breathing  Post-op Assessment: Report given to RN and Post -op Vital signs reviewed and stable  Post vital signs: Reviewed and stable  Last Vitals:  Vitals Value Taken Time  BP 128/67 04/08/22 1054  Temp    Pulse 92 04/08/22 1055  Resp 0 04/08/22 1055  SpO2 90 % 04/08/22 1055  Vitals shown include unvalidated device data.  Last Pain:  Vitals:   04/08/22 0825  TempSrc:   PainSc: 4          Complications: No notable events documented.

## 2022-04-08 NOTE — Anesthesia Preprocedure Evaluation (Signed)
Anesthesia Evaluation  Patient identified by MRN, date of birth, ID band Patient awake    Reviewed: Allergy & Precautions, H&P , NPO status , Patient's Chart, lab work & pertinent test results  Airway Mallampati: II  TM Distance: >3 FB Neck ROM: Full    Dental no notable dental hx.    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   Pulmonary exam normal breath sounds clear to auscultation       Cardiovascular hypertension, + Peripheral Vascular Disease  Normal cardiovascular exam Rhythm:Regular Rate:Normal     Neuro/Psych negative neurological ROS  negative psych ROS   GI/Hepatic negative GI ROS, Neg liver ROS,,,  Endo/Other  diabetes    Renal/GU negative Renal ROS  negative genitourinary   Musculoskeletal negative musculoskeletal ROS (+)    Abdominal   Peds negative pediatric ROS (+)  Hematology  (+) Blood dyscrasia, anemia   Anesthesia Other Findings   Reproductive/Obstetrics negative OB ROS                             Anesthesia Physical Anesthesia Plan  ASA: 4  Anesthesia Plan: General   Post-op Pain Management: Minimal or no pain anticipated   Induction: Intravenous  PONV Risk Score and Plan: 2 and Ondansetron, Dexamethasone and Treatment may vary due to age or medical condition  Airway Management Planned: Oral ETT  Additional Equipment:   Intra-op Plan:   Post-operative Plan: Extubation in OR  Informed Consent: I have reviewed the patients History and Physical, chart, labs and discussed the procedure including the risks, benefits and alternatives for the proposed anesthesia with the patient or authorized representative who has indicated his/her understanding and acceptance.     Dental advisory given  Plan Discussed with: CRNA and Surgeon  Anesthesia Plan Comments:        Anesthesia Quick Evaluation

## 2022-04-09 ENCOUNTER — Encounter: Payer: Self-pay | Admitting: Oncology

## 2022-04-09 NOTE — Telephone Encounter (Signed)
Follow up call made to pt. He states tat he is currently holding lasix and BP medication.

## 2022-04-10 ENCOUNTER — Other Ambulatory Visit: Payer: Self-pay

## 2022-04-10 ENCOUNTER — Ambulatory Visit: Payer: PPO

## 2022-04-10 ENCOUNTER — Encounter (HOSPITAL_COMMUNITY): Payer: Self-pay | Admitting: Radiology

## 2022-04-10 DIAGNOSIS — Z79899 Other long term (current) drug therapy: Secondary | ICD-10-CM

## 2022-04-11 ENCOUNTER — Other Ambulatory Visit: Payer: PPO

## 2022-04-11 ENCOUNTER — Ambulatory Visit: Payer: PPO | Admitting: Oncology

## 2022-04-11 ENCOUNTER — Ambulatory Visit: Payer: PPO

## 2022-04-14 ENCOUNTER — Inpatient Hospital Stay (HOSPITAL_BASED_OUTPATIENT_CLINIC_OR_DEPARTMENT_OTHER): Payer: PPO | Admitting: Oncology

## 2022-04-14 ENCOUNTER — Other Ambulatory Visit: Payer: Self-pay

## 2022-04-14 ENCOUNTER — Inpatient Hospital Stay: Payer: PPO

## 2022-04-14 ENCOUNTER — Encounter: Payer: Self-pay | Admitting: Oncology

## 2022-04-14 VITALS — BP 93/59 | HR 78 | Temp 97.6°F | Resp 18 | Wt 154.0 lb

## 2022-04-14 DIAGNOSIS — Z5111 Encounter for antineoplastic chemotherapy: Secondary | ICD-10-CM | POA: Diagnosis not present

## 2022-04-14 DIAGNOSIS — G893 Neoplasm related pain (acute) (chronic): Secondary | ICD-10-CM | POA: Diagnosis not present

## 2022-04-14 DIAGNOSIS — C3412 Malignant neoplasm of upper lobe, left bronchus or lung: Secondary | ICD-10-CM

## 2022-04-14 DIAGNOSIS — I2699 Other pulmonary embolism without acute cor pulmonale: Secondary | ICD-10-CM

## 2022-04-14 DIAGNOSIS — I9589 Other hypotension: Secondary | ICD-10-CM | POA: Diagnosis not present

## 2022-04-14 DIAGNOSIS — D520 Dietary folate deficiency anemia: Secondary | ICD-10-CM | POA: Diagnosis not present

## 2022-04-14 DIAGNOSIS — I619 Nontraumatic intracerebral hemorrhage, unspecified: Secondary | ICD-10-CM

## 2022-04-14 DIAGNOSIS — C7951 Secondary malignant neoplasm of bone: Secondary | ICD-10-CM | POA: Diagnosis not present

## 2022-04-14 DIAGNOSIS — E861 Hypovolemia: Secondary | ICD-10-CM

## 2022-04-14 LAB — COMPREHENSIVE METABOLIC PANEL
ALT: 12 U/L (ref 0–44)
AST: 25 U/L (ref 15–41)
Albumin: 2.9 g/dL — ABNORMAL LOW (ref 3.5–5.0)
Alkaline Phosphatase: 101 U/L (ref 38–126)
Anion gap: 11 (ref 5–15)
BUN: 18 mg/dL (ref 8–23)
CO2: 22 mmol/L (ref 22–32)
Calcium: 8.3 mg/dL — ABNORMAL LOW (ref 8.9–10.3)
Chloride: 99 mmol/L (ref 98–111)
Creatinine, Ser: 1.22 mg/dL (ref 0.61–1.24)
GFR, Estimated: >60 mL/min (ref >60)
Glucose, Bld: 169 mg/dL — ABNORMAL HIGH (ref 70–99)
Potassium: 4.2 mmol/L (ref 3.5–5.1)
Sodium: 132 mmol/L — ABNORMAL LOW (ref 135–145)
Total Bilirubin: 0.2 mg/dL — ABNORMAL LOW (ref 0.3–1.2)
Total Protein: 6.2 g/dL — ABNORMAL LOW (ref 6.5–8.1)

## 2022-04-14 LAB — CBC WITH DIFFERENTIAL/PLATELET
Abs Immature Granulocytes: 0.29 10*3/uL — ABNORMAL HIGH (ref 0.00–0.07)
Basophils Absolute: 0.1 10*3/uL (ref 0.0–0.1)
Basophils Relative: 0 %
Eosinophils Absolute: 0.2 10*3/uL (ref 0.0–0.5)
Eosinophils Relative: 2 %
HCT: 27 % — ABNORMAL LOW (ref 39.0–52.0)
Hemoglobin: 8.7 g/dL — ABNORMAL LOW (ref 13.0–17.0)
Immature Granulocytes: 2 %
Lymphocytes Relative: 6 %
Lymphs Abs: 0.7 10*3/uL (ref 0.7–4.0)
MCH: 34.3 pg — ABNORMAL HIGH (ref 26.0–34.0)
MCHC: 32.2 g/dL (ref 30.0–36.0)
MCV: 106.3 fL — ABNORMAL HIGH (ref 80.0–100.0)
Monocytes Absolute: 0.6 10*3/uL (ref 0.1–1.0)
Monocytes Relative: 5 %
Neutro Abs: 10.7 10*3/uL — ABNORMAL HIGH (ref 1.7–7.7)
Neutrophils Relative %: 85 %
Platelets: 375 10*3/uL (ref 150–400)
RBC: 2.54 MIL/uL — ABNORMAL LOW (ref 4.22–5.81)
RDW: 16.6 % — ABNORMAL HIGH (ref 11.5–15.5)
WBC: 12.6 10*3/uL — ABNORMAL HIGH (ref 4.0–10.5)
nRBC: 0 % (ref 0.0–0.2)

## 2022-04-14 LAB — RETIC PANEL
Immature Retic Fract: 8 % (ref 2.3–15.9)
RBC.: 2.47 MIL/uL — ABNORMAL LOW (ref 4.22–5.81)
Retic Count, Absolute: 63.5 10*3/uL (ref 19.0–186.0)
Retic Ct Pct: 2.6 % (ref 0.4–3.1)
Reticulocyte Hemoglobin: 32 pg (ref >27.9)

## 2022-04-14 MED ORDER — SODIUM CHLORIDE 0.9 % IV SOLN
Freq: Once | INTRAVENOUS | Status: AC
  Filled 2022-04-14: qty 250

## 2022-04-14 MED ORDER — SODIUM CHLORIDE 0.9 % IV SOLN
200.0000 mg | Freq: Once | INTRAVENOUS | Status: AC
  Administered 2022-04-14: 200 mg via INTRAVENOUS
  Filled 2022-04-14: qty 8

## 2022-04-14 MED ORDER — HEPARIN SOD (PORK) LOCK FLUSH 100 UNIT/ML IV SOLN
500.0000 [IU] | Freq: Once | INTRAVENOUS | Status: AC | PRN
  Administered 2022-04-14: 500 [IU]
  Filled 2022-04-14: qty 5

## 2022-04-14 NOTE — Progress Notes (Signed)
Hematology/Oncology Progress note [phone] [phone]) [phone]       CHIEF COMPLAINTS/REASON FOR VISIT:  Metastatic non-small cell lung cancer  ASSESSMENT & PLAN:   Cancer Staging  Primary non-small cell carcinoma of upper lobe of left lung (HCC) Staging form: Lung, AJCC 8th Edition - Clinical: Stage IV (cT2b, cN1, cM1) - Signed by [name], MD on 08/06/2021   Primary non-small cell carcinoma of upper lobe of left lung ([place]) Stage IV lung adenocarcinoma with brain and bone metastasis.  PET scan images were reviewed with patient.- good partial response.  Hold  maintenance Alimta/Keytruda  -dose reduce Alimta 350mg /m2 Due to decrease PS, discontinue Alimta.  Labs are reviewed and discussed with patient. Proceed with Keytruda Repeat CT chest abdomen pelvis wo contrast   Encounter for antineoplastic chemotherapy Chemotherapy plan as listed above  Hypotension normal cortisol and ACTH  Probably due to poor oral intake.  Recommend to hold off Lasix and losartan.   Intraparenchymal hemorrhage of brain (HCC) Hold Eliquis.  Follow-up with Dr. Mickeal Skinner  Folate deficiency anemia folate is low Recommend folic acid supplementation.    Metastasis to bone (HCC) Recommend Zometa every 4- 6 weeks.-Hold today Recommend calcium supplementation.  MRI lumbar with and without contrast showed  Stable remote superior endplate fracture of L2 and large Schmorl to noted involving L5. No worrisome bone lesions to suggest metastatic disease.  Neoplasm related pain Back pain, MRI lumbar showed compression fracture Continue PRN percocet, recently added fentanyl patch recommend Flexeril  MRI lumbar showed DJD, L4-L5 synovial cyst, no new bone mets.  He may benefit from epidural steroid injections.    Pulmonary embolus (HCC) MRI brain shows persistent microbleeds.   Off Eliquis 2.5mg  BID.      Follow up 1 week  All questions were answered. The patient knows to call the clinic  with any problems, questions or concerns.  [name], MD, [name] [facility] Hematology Oncology 04/14/2022      HISTORY OF PRESENTING ILLNESS:   Micheal Donovan is a  72 y.o.  male presents for follow up of Non-small cell lung cancer.  Oncology History Overview Note  Diagnosis: Stage IIB T2b N1 M0 adenocarcinoma of the LUL, poorly differentiated     Primary non-small cell carcinoma of upper lobe of left lung ([place])  06/13/2021 Initial Diagnosis   Primary non-small cell carcinoma of upper lobe of left lung [facility]) -06/20/2021 - 06/27/2021, patient presented to [facility] due to progressive headache/dizziness/gait changes. CT head showed acute to subacute intraparenchymal hemorrhage involving the left cerebellum.  Surrounding low-density vasogenic edema.  Patient was transferred to [facility]. This was further evaluated by CT angiogram of the neck which showed bulky calcified plaques/stenosis of carotid artery, Followed by MRI brain. 06/12/2021, MRI of the brain showed no significant interval change in size of the left cerebellar intraparenchymal hematoma with unchanged regional mass effect and partial effacement of fourth ventricle but no upstream hydrocephalus. There is no discernible enhancement to suggest underlying mass lesion, though acute blood products could mask enhancement.   06/12/2021 a chest x-ray showed a 4.4 cm left middle lobe. 06/13/2021, CT chest with contrast showed a 4.3 x 3.6 x 3.3 cm lobular spiculated mass in the posterior left upper lobe with T3 to the lateral pleura and major fissure.  Metastatic left hilar lymphadenopathy.  Peripheral micronodularity posterior right costophrenic sulcus.  Aortic atherosclerosis. 06/14/2021, CT abdomen pelvis showed right lower lobe pulmonary artery embolus.  No evidence of right heart strain.  No  acute intra-abdominal or pelvic pathology.  Aortic atherosclerosis.  06/13/2021, patient underwent bronchoscopy with EBUS by Dr. Tamala Julian.  Biopsy  from the fine-needle aspiration of station 11 mL showed malignant cells, consistent with poorly differentiated non-small cell carcinoma, consistent with adenocarcinoma.  Malignant cells are TTF-1 positive and negative for p40.  Negative for neuroendocrine markers.  Blood test and tissue sample were tested for Gardant 360  -PD-L1 TPS 97%, no actionable mutation on the blood testing. 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   07/17/2021 Cancer Staging   Staging form: Lung, AJCC 8th Edition - Clinical: Stage IV (cT2b, cN1, cM1) - Signed by [name], MD on 08/06/2021   08/06/2021 Imaging   I saw patient's PET scan after his visit with me on 08/06/21. PET scan was ordered by his previous oncologist group and did not come to my in basket.. Patient received cycle 1 carboplatin Taxol today.  He has started on radiation.  5/26/3 PET scan showed 3.8 cm hypermetabolic left upper lobe mass with hypermetabolic left hilar and infrahilar adenopathy.  There is approximately 8 scattered metastatic lesions in the skeleton. Mixed density photopenic lesion in the left cerebellum. characterized as hemorrhage on recent prior imaging workups.    08/16/2021 -  Chemotherapy   Patient is on Treatment Plan : LUNG Carboplatin (5) + Pemetrexed + Pembrolizumab (200) D1 q21d Induction x 4 cycles / Maintenance Pemetrexed + Pembrolizumab (200) D1 q21d      08/22/2021 Imaging   MRI brain w wo contrast  Decrease in size of left cerebellar hematoma with resolution of edema. No evidence of underlying lesion. Smaller, more recent hemorrhage in the left cerebellar vermis with minimal edema. There is minimal enhancement without definite evidence of underlying lesion.  New punctate focus of chronic blood products and enhancement in the left frontal lobe. Additional new foci of chronic blood products in the posterior right putamen, right parietal subcortical white matter, and posterior right cerebellum. Unclear at this time if  these represent foci of bland hemorrhage or early metastases.   Increase in size of right parietal osseous metastasis with minor extraosseous extension.     Imaging   PET scan showed 1. Interval response to therapy as evidenced by a small residual left upper lobe nodule with decreased hypermetabolism, no residual hypermetabolic adenopathy and decreased hypermetabolism associatedwith osseous metastases. 2. 6 mm posterior left upper lobe nodule, likely stable. Recommend attention on follow-up. 3. Aortic atherosclerosis (ICD10-I70.0). Coronary artery calcification.   12/17/2021 Imaging   MRI lumbar spine w wo contrast  1. No evidence of regional metastatic disease. 2. Late subacute superior endplate fracture at L2 and large superior endplate Schmorl's node at L5, unchanged since the PET scan of 10/29/2021. 3. L3-4: Shallow disc protrusion. Facet and ligamentous hypertrophy. Stenosis of both lateral recesses. Findings slightly worsened since 2022. 4. L4-5: Shallow disc protrusion. Facet and ligamentous hypertrophy. Small synovial cyst arising from the facet joint on the right. Stenosis of the lateral recesses right worse than left. Foraminal narrowing right worse than left. Findings have worsened since 2022.  5. L5-S1: Endplate osteophytes and shallow protrusion of the disc. Facet and ligamentous hypertrophy. Stenosis of the subarticular lateral recesses and neural foramina, right worse than left. Similar appearance to the study of 2022. 6. Continued evidence of enteritis of at least 1 loop of small bowel. Distended bladder present   12/18/2021 Imaging   Brian MRI w wo  1. Interval decrease in size of the left cerebellar and vermian hematomas. No definite  evidence of underlying metastatic lesion. 2. There is are two possible contrast enhancing lesions in the posterior left frontal lobe and left occipital lobe, which do not have intrinsic T1 signal abnormality, but have a somewhat linear  appearance and may be vascular in nature. Recommend attention on follow up. 3. Multifocal sites of susceptibility artifact are redemonstrated with interval development of a few new foci, as described above. All of these sites demonstrate intrinsic T1 hyperintense signal abnormality and susceptibility artifact and are compatible with sites of microhemorrhages. Recommend continued attention on follow up. 4. Interval decrease in size of right parietal calvarial metastatic lesion. No new contrast enhancing lesions visualized. 5. Diffusely heterogeneous marrow signal throughout the cervical spine, which is nonspecific but can be seen in the setting of anemia, smoking, obesity, or a marrow replacement process.     12/18/2021 Imaging   MRI lumbar spine w wo contrast  1. No evidence of regional metastatic disease. 2. Late subacute superior endplate fracture at L2 and large superior endplate Schmorl's node at L5, unchanged since the PET scan of 10/29/2021 3. L3-4: Shallow disc protrusion. Facet and ligamentous hypertrophy.Stenosis of both lateral recesses. Findings slightly worsened since 2022. 4. L4-5: Shallow disc protrusion. Facet and ligamentous hypertrophy. Small synovial cyst arising from the facet joint on the right. Stenosis of the lateral recesses right worse than left. Foraminal narrowing right worse than left. Findings have worsened since 2022. 5. L5-S1: Endplate osteophytes and shallow protrusion of the disc. Facet and ligamentous hypertrophy. Stenosis of the subarticular lateral recesses and neural foramina, right worse than left. Similar appearance to the study of 2022. 6. Continued evidence of enteritis of at least 1 loop of small bowel. Distended bladder present.       01/28/2022 Imaging   CT chest abdomen pelvis w contrast 1. Treated left upper lobe mass appears grossly stable to the prior examination when measured in a similar fashion on the prior study. No definite signs of extra  skeletal metastatic disease noted in the chest, abdomen or pelvis. 2. Widespread skeletal metastases redemonstrated, as above. 3. Hepatic steatosis. 4. Aortic atherosclerosis, in addition to left main and three-vessel coronary artery disease. Assessment for potential risk factor modification, dietary therapy or pharmacologic therapy may be warranted, if clinically indicated. 5. Additional incidental findings,    # Patient has a history of melanoma on his neck, treated in 2009. # History of hemorrhagic infarct left cerebellum  INTERVAL HISTORY Micheal Donovan is a 72 y.o. male who has above history reviewed by me today presents for follow up visit for management of  Stage IV lung adenocarcianoma # Pulmonary embolism, SOB is stable, not worse. Off Eliquis due to MRI findings.  + Insomnia and nocturia. On Ativan QHS, not helping - he takes frequent naps during the day + Lower extremity swelling, started on Lasix, swelling has improved in calf, still some swelling in his feet.  + Feeling weak.  Loss of appetite. Wife reports that patient is in bed all day long.  + back pain, on fentanyl patch and percocet, pain is relieved at rest, but still have pain when he moves around or bends down  Accompanied by wife.    Review of Systems  Constitutional:  Positive for fatigue. Negative for appetite change, chills, fever and unexpected weight change.  HENT:   Negative for hearing loss and voice change.   Eyes:  Negative for eye problems and icterus.  Respiratory:  Positive for shortness of breath. Negative for chest tightness and  cough.   Cardiovascular:  Positive for leg swelling. Negative for chest pain.  Gastrointestinal:  Negative for abdominal distention, abdominal pain and blood in stool.  Endocrine: Negative for hot flashes.  Genitourinary:  Negative for difficulty urinating, dysuria and frequency.   Musculoskeletal:  Positive for back pain. Negative for arthralgias.  Skin:  Negative for itching  and rash.  Neurological:  Negative for extremity weakness, headaches, light-headedness and numbness.  Hematological:  Negative for adenopathy. Does not bruise/bleed easily.  Psychiatric/Behavioral:  Positive for sleep disturbance. Negative for confusion.     MEDICAL HISTORY:  Past Medical History:  Diagnosis Date   Allergy    BPH (benign prostatic hypertrophy)    Cancer (HCC)    Melanoma on Neck    2008   Carotid artery occlusion    CHF (congestive heart failure) (HCC)    Diabetes mellitus    type 2   ED (erectile dysfunction)    GERD (gastroesophageal reflux disease)    Hemorrhagic stroke ([place]) 06/2021   Hyperlipidemia    Hypertension    Lung cancer ([place])    Neoplasm related pain    Retinopathy due to secondary DM ([place])     SURGICAL HISTORY: Past Surgical History:  Procedure Laterality Date   BRONCHIAL NEEDLE ASPIRATION BIOPSY  06/17/2021   Procedure: BRONCHIAL NEEDLE ASPIRATION BIOPSIES;  Surgeon: Candee Furbish, MD;  Location: [place];  Service: Pulmonary;;   IR IMAGING GUIDED PORT INSERTION  08/05/2021   MELANOMA EXCISION  2008   Left side of neck   RADIOLOGY WITH ANESTHESIA N/A 04/05/2020   Procedure: MRI SPINE WITOUT CONTRAST;  Surgeon: Radiologist, Medication, MD;  Location: [place];  Service: Radiology;  Laterality: N/A;   RADIOLOGY WITH ANESTHESIA N/A 08/22/2021   Procedure: MRI BRAIN WITH AND WITHOUT CONTRAST  WITH ANESTHESIA;  Surgeon: Radiologist, Medication, MD;  Location: [place];  Service: Radiology;  Laterality: N/A;   RADIOLOGY WITH ANESTHESIA N/A 12/17/2021   Procedure: MRI BRAIN WITH AND WITHOUT CONTRAST WITH ANESTHESIA; MRI LUMBER WITH AND WITHOUT CONTRAST;  Surgeon: Radiologist, Medication, MD;  Location: [place];  Service: Radiology;  Laterality: N/A;   RADIOLOGY WITH ANESTHESIA N/A 04/08/2022   Procedure: MRI LUMBER SPINE WITH AND WITHOUT CONTRAST;  Surgeon: Radiologist, Medication, MD;  Location: [place];  Service: Radiology;  Laterality: N/A;   TONSILLECTOMY      VIDEO BRONCHOSCOPY WITH ENDOBRONCHIAL ULTRASOUND N/A 06/17/2021   Procedure: VIDEO BRONCHOSCOPY WITH ENDOBRONCHIAL ULTRASOUND;  Surgeon: Candee Furbish, MD;  Location: [facility] ENDOSCOPY;  Service: Pulmonary;  Laterality: N/A;    SOCIAL HISTORY: Social History   Socioeconomic History   Marital status: Married    Spouse name: Not on file   Number of children: Not on file   Years of education: Not on file   Highest education level: Not on file  Occupational History   Not on file  Tobacco Use   Smoking status: Former    Types: Cigarettes    Quit date: 07/21/1990    Years since quitting: 31.7   Smokeless tobacco: Never  Vaping Use   Vaping Use: Never used  Substance and Sexual Activity   Alcohol use: No   Drug use: No   Sexual activity: Not Currently  Other Topics Concern   Not on file  Social History Narrative   Not on file   Social Determinants of Health   Financial Resource Strain: Low Risk  (03/27/2022)   Overall Financial Resource Strain (CARDIA)    Difficulty of Paying Living  Expenses: Not hard at all  Food Insecurity: No Food Insecurity (03/27/2022)   Hunger Vital Sign    Worried About Running Out of Food in the Last Year: Never true    Ran Out of Food in the Last Year: Never true  Transportation Needs: No Transportation Needs (03/27/2022)   PRAPARE - Hydrologist (Medical): No    Lack of Transportation (Non-Medical): No  Physical Activity: Inactive (03/27/2022)   Exercise Vital Sign    Days of Exercise per Week: 0 days    Minutes of Exercise per Session: 0 min  Stress: Stress Concern Present (03/27/2022)   [place]    Feeling of Stress : To some extent  Social Connections: Moderately Isolated (03/27/2022)   Social Connection and Isolation Panel [NHANES]    Frequency of Communication with Friends and Family: More than three times a week    Frequency of Social Gatherings  with Friends and Family: Three times a week    Attends Religious Services: Never    Active Member of Clubs or Organizations: No    Attends Archivist Meetings: Never    Marital Status: Married  Human resources officer Violence: Not At Risk (03/27/2022)   Humiliation, Afraid, Rape, and Kick questionnaire    Fear of Current or Ex-Partner: No    Emotionally Abused: No    Physically Abused: No    Sexually Abused: No    FAMILY HISTORY: Family History  Problem Relation Age of Onset   COPD Mother    Heart disease Father    Heart disease Brother        MI at age 52   Stroke Neg Hx     ALLERGIES:  is allergic to niaspan [niacin].  MEDICATIONS:  Current Outpatient Medications  Medication Sig Dispense Refill   aspirin EC 81 MG tablet Take 1 tablet (81 mg total) by mouth daily. Swallow whole. 30 tablet 12   atorvastatin (LIPITOR) 40 MG tablet Take 1 tablet (40 mg total) by mouth daily. 60 tablet 2   Ca Phosphate-Cholecalciferol (CALCIUM WITH D3 PO) Take 1 capsule by mouth daily.     cyclobenzaprine (FLEXERIL) 5 MG tablet Take 1 tablet (5 mg total) by mouth 2 (two) times daily as needed for muscle spasms. 60 tablet 1   fentaNYL (DURAGESIC) 50 MCG/HR Place 1 patch onto the skin every 3 (three) days. 10 patch 0   folic acid (FOLVITE) 536 MCG tablet Take 1 tablet (800 mcg total) by mouth daily. 60 tablet 1   gabapentin (NEURONTIN) 100 MG capsule Take 1 capsule (100 mg total) by mouth 3 (three) times daily. 90 capsule 0   gentamicin (GARAMYCIN) 0.3 % ophthalmic solution Place 1 drop into both eyes 3 (three) times daily. 5 mL 0   insulin glargine (LANTUS) 100 UNIT/ML injection Inject 30 Units into the skin daily as needed. Only takes Lantus when sugar is above 150.     Iron-Vitamin C 65-125 MG TABS Take 1 tablet by mouth daily. 30 tablet 1   lidocaine (LIDODERM) 5 % Place 1 patch onto the skin daily. Remove & Discard patch within 12 hours or as directed by MD 30 patch 0   NOVOLOG FLEXPEN 100  UNIT/ML FlexPen Inject 0-10 Units into the skin daily as needed for high blood sugar (above 200). Sliding scale     omeprazole (PRILOSEC) 20 MG capsule Take 20 mg by mouth daily.     oxyCODONE-acetaminophen (PERCOCET)  7.5-325 MG tablet Take 1 tablet by mouth every 4 (four) hours as needed for severe pain. Do not take with cough medication or other pain medication 60 tablet 0   Probiotic Product (PROBIOTIC DAILY PO) Take by mouth. Physician's Choice Probiotic     tamsulosin (FLOMAX) 0.4 MG CAPS capsule TAKE 1 CAPSULE(0.4 MG) BY MOUTH DAILY 90 capsule 0   traZODone (DESYREL) 50 MG tablet Take 1 tablet (50 mg total) by mouth at bedtime as needed for sleep. 30 tablet 0   UNABLE TO FIND PLEASE CHECK FASTING BLOOD GLUCOSE EVERY MORNING 1 each 0   dronabinol (MARINOL) 5 MG capsule Take 1 capsule (5 mg total) by mouth in the morning and at bedtime. (Patient not taking: Reported on 04/14/2022) 60 capsule 0   furosemide (LASIX) 20 MG tablet Take 1 tablet (20 mg total) by mouth daily as needed (leg swelling or sob). (Patient not taking: Reported on 04/04/2022) 30 tablet 3   HYDROcodone bit-homatropine (HYCODAN) 5-1.5 MG/5ML syrup Take 5 mLs by mouth every 8 (eight) hours as needed for cough. (Patient not taking: Reported on 02/25/2022) 120 mL 0   LORazepam (ATIVAN) 1 MG tablet Take 1 tablet (1 mg total) by mouth at bedtime. (Patient not taking: Reported on 04/14/2022) 30 tablet 2   triamcinolone ointment (KENALOG) 0.5 % Apply 1 Application topically 2 (two) times daily. (Patient not taking: Reported on 03/25/2022) 30 g 0   valsartan (DIOVAN) 80 MG tablet Take 1 tablet (80 mg total) by mouth daily. (Patient not taking: Reported on 04/08/2022) 90 tablet 3   No current facility-administered medications for this visit.   Facility-Administered Medications Ordered in Other Visits  Medication Dose Route Frequency Provider Last Rate Last Admin   heparin lock flush 100 UNIT/ML injection              PHYSICAL  EXAMINATION:  Vitals:   04/14/22 1032  BP: (!) 93/59  Pulse: 78  Resp: 18  Temp: 97.6 F (36.4 C)  SpO2: 99%   Filed Weights   04/14/22 1032  Weight: 154 lb (69.9 kg)    Physical Exam Constitutional:      General: He is not in acute distress.    Appearance: He is ill-appearing.  HENT:     Head: Normocephalic and atraumatic.  Eyes:     General: No scleral icterus. Cardiovascular:     Rate and Rhythm: Normal rate and regular rhythm.     Heart sounds: Normal heart sounds.  Pulmonary:     Effort: Pulmonary effort is normal. No respiratory distress.     Breath sounds: No wheezing.     Comments: Decreased breath sound bilaterally. Abdominal:     General: Bowel sounds are normal. There is no distension.     Palpations: Abdomen is soft.  Musculoskeletal:        General: No deformity. Normal range of motion.     Cervical back: Normal range of motion and neck supple.     Comments: Bilateral ankle swelling, right >left.   Skin:    General: Skin is warm and dry.     Findings: No erythema or rash.  Neurological:     Mental Status: He is alert and oriented to person, place, and time. Mental status is at baseline.     Cranial Nerves: No cranial nerve deficit.     Coordination: Coordination normal.  Psychiatric:        Mood and Affect: Mood normal.     LABORATORY DATA:  I  have reviewed the data as listed    Latest Ref Rng & Units 04/14/2022   10:14 AM 04/04/2022   10:07 AM 03/25/2022    3:00 PM  CBC  WBC 4.0 - 10.5 K/uL 12.6  13.2  21.5   Hemoglobin 13.0 - 17.0 g/dL 8.7  8.6  9.6   Hematocrit 39.0 - 52.0 % 27.0  25.5  28.1   Platelets 150 - 400 K/uL 375  355  360       Latest Ref Rng & Units 04/14/2022   10:14 AM 04/04/2022   10:07 AM 03/25/2022    3:00 PM  CMP  Glucose 70 - 99 mg/dL 169  176  167   BUN 8 - 23 mg/dL 18  16  14    Creatinine 0.61 - 1.24 mg/dL 1.22  1.40  1.47   Sodium 135 - 145 mmol/L 132  135  131   Potassium 3.5 - 5.1 mmol/L 4.2  3.6  3.3   Chloride  98 - 111 mmol/L 99  101  96   CO2 22 - 32 mmol/L 22  24  23    Calcium 8.9 - 10.3 mg/dL 8.3  8.8  9.0   Total Protein 6.5 - 8.1 g/dL 6.2  6.1  6.4   Total Bilirubin 0.3 - 1.2 mg/dL 0.2  0.3  0.3   Alkaline Phos 38 - 126 U/L 101  112  135   AST 15 - 41 U/L 25  30  42   ALT 0 - 44 U/L 12  14  28       Iron/TIBC/Ferritin/ %Sat    Component Value Date/Time   IRON 60 02/03/2022 0958   TIBC 391 02/03/2022 0958   FERRITIN 704 (H) 02/03/2022 0958   IRONPCTSAT 15 (L) 02/03/2022 6378      RADIOGRAPHIC STUDIES: I have personally reviewed the radiological images as listed and agreed with the findings in the report. MR Lumbar Spine W Wo Contrast  Result Date: 04/08/2022 CLINICAL DATA:  Persistent severe back pain. History of lung cancer. EXAM: MRI LUMBAR SPINE WITHOUT AND WITH CONTRAST TECHNIQUE: Multiplanar and multiecho pulse sequences of the lumbar spine were obtained without and with intravenous contrast. CONTRAST:  47mL GADAVIST GADOBUTROL 1 MMOL/ML IV SOLN COMPARISON:  MRI 12/17/2021 FINDINGS: Segmentation: There are five lumbar type vertebral bodies. The last full intervertebral disc space is labeled L5-S1. This correlates with the prior study. Alignment: Normal overall alignment. Stable degenerative retrolisthesis of L5. Vertebrae: Remote superior endplate fracture of L2 appears stable. There is also a very large Schmorl's node in the L5 vertebral body which is unchanged. No findings suspicious for osseous metastatic disease. No areas of worrisome contrast enhancement. Conus medullaris and cauda equina: Conus extends to the L1 level. Conus and cauda equina appear normal. Paraspinal and other soft tissues: Medial fatty atrophy and patchy edema in the paraspinal musculature. No significant retroperitoneal findings. Disc levels: L1-2: Stable mild bulging annulus and moderate facet disease but no spinal or foraminal stenosis. L2-3: Shallow right paracentral disc protrusion with minimal impression on the  ventral thecal sac. Mild right lateral recess encroachment. No foraminal stenosis. No change since prior. L3-4: Shallow broad-based disc protrusion and moderate facet disease contributing to mild right and moderate left lateral recess stenosis. There is also stable mild left foraminal encroachment. L4-5: Bulging degenerated annulus, shallow left paracentral disc protrusion and advanced facet disease appears stable. There is an enlarging right-sided synovial cyst now measuring 6 mm. There is slightly progressive mass effect on  the lateral aspect of the thecal sac which could be symptomatic. This in combination with the above findings contributes to mild spinal and moderate bilateral lateral recess stenosis. No significant foraminal stenosis. L5-S1: Bulging degenerated annulus, osteophytic ridging and facet disease. Mild bilateral lateral recess stenosis but no significant spinal stenosis and no significant change since prior study. IMPRESSION: 1. Stable remote superior endplate fracture of L2 and large Schmorl to noted involving L5. No worrisome bone lesions to suggest metastatic disease. 2. Degenerative lumbar spondylosis with multilevel disc disease and facet disease which appears relatively stable as detailed above. 3. Enlarging right-sided synovial cyst at L4-5 with progressive mass effect on the right side of the thecal sac. This could potentially be symptomatic. Electronically Signed   By: Marijo Sanes M.D.   On: 04/08/2022 11:16   DG Lumbar Spine Complete  Result Date: 03/22/2022 CLINICAL DATA:  Back pain, history of non-small cell carcinoma LEFT upper lobe, no injury, pain across lower back off and on for 10 years EXAM: LUMBAR SPINE - COMPLETE 4+ VIEW COMPARISON:  CT abdomen and pelvis 01/27/2022 FINDINGS: Osseous demineralization. Five non-rib-bearing lumbar vertebra. Multilevel disc space narrowing and endplate spur formation. Superior endplate compression deformity of L2 vertebral body with mild  anterior height loss, unchanged since 01/27/2022. No additional fracture, subluxation, or bone destruction. Atherosclerotic calcifications aorta. Increased stool throughout proximal half of colon. IMPRESSION: Chronic superior endplate compression deformity of L2 vertebral body. Osseous demineralization with multilevel degenerative disc disease changes. Increased stool in proximal colon. Aortic Atherosclerosis (ICD10-I70.0). Electronically Signed   By: Lavonia Dana M.D.   On: 03/22/2022 21:44   CT Lumbar Spine Wo Contrast  Result Date: 03/20/2022 CLINICAL DATA:  Metastatic disease to lower back. Back pain. Evaluate compression fracture for possible kyphoplasty/RFA. History of lung cancer and melanoma. EXAM: CT LUMBAR SPINE WITHOUT CONTRAST TECHNIQUE: Multidetector CT imaging of the lumbar spine was performed without intravenous contrast administration. Multiplanar CT image reconstructions were also generated. RADIATION DOSE REDUCTION: This exam was performed according to the departmental dose-optimization program which includes automated exposure control, adjustment of the mA and/or kV according to patient size and/or use of iterative reconstruction technique. COMPARISON:  MRI lumbar spine 12/17/2021 and 04/05/2020. Abdominopelvic CT 11/18/2021 and 01/27/2022. PET-CT 10/29/2021 and 07/24/2021. FINDINGS: Segmentation: There are 5 lumbar type vertebral bodies. Alignment: Chronic straightening without focal angulation or significant anterolisthesis. There is a stable chronic retrolisthesis at L5-S1. Vertebrae: Chronic pathologic fractures involving the superior endplates of L2 and L5 are unchanged from the CT of 11/18/2021 and lumbar MRI 12/17/2021. No progressive loss of height at either level. No new fracture or progressive metastatic disease demonstrated. Grossly stable node metastatic disease in the visualized upper pelvis without pathologic fracture. Paraspinal and other soft tissues: No acute paraspinal  findings. There is aortic and branch vessel atherosclerosis. Diffuse subcutaneous edema noted in the back without focal fluid collection. Disc levels: L1-2: Disc bulging with vacuum disc phenomenon. No significant spinal stenosis. L2-3: Mild disc bulging with endplate osteophytes. No significant spinal stenosis. L3-4: Loss of disc height with disc bulging and endplate osteophytes asymmetric to the left. Mild facet and ligamentous hypertrophy. Stable mild asymmetric left lateral recess and left foraminal narrowing. L4-5: Chronic pathologic fracture/Schmorl's node in the superior endplate of L5. Disc desiccation with annular bulging, facet and ligamentous hypertrophy. Resulting mild multifactorial spinal stenosis with mild lateral recess and foraminal narrowing bilaterally, unchanged from most recent MRI. L5-S1: Chronic degenerative disc disease with loss of disc height, disc bulging, endplate osteophytes and  vacuum phenomenon. Chronic mild to moderate foraminal narrowing bilaterally appears unchanged. IMPRESSION: 1. Stable appearance of the lumbar spine compared with most recent MRI of 12/17/2021 and CT of 11/18/2021. 2. Chronic pathologic fractures in the superior endplates of L2 and L5 are unchanged. No progressive loss of height. No new fracture or progressive metastatic disease demonstrated. 3. Multilevel spondylosis as described, similar to most recent MRI. 4.  Aortic Atherosclerosis (ICD10-I70.0). Electronically Signed   By: Richardean Sale M.D.   On: 03/20/2022 16:32

## 2022-04-14 NOTE — Progress Notes (Signed)
Pt reports that he got nauseated a few times this past week. He has been holding lasix and valsartan (when BP is low). He will need  a refill on Fentanyl patches.

## 2022-04-14 NOTE — Assessment & Plan Note (Signed)
Chemotherapy plan as listed above 

## 2022-04-14 NOTE — Assessment & Plan Note (Signed)
Recommend Zometa every 4- 6 weeks.-Hold today Recommend calcium supplementation.  MRI lumbar with and without contrast showed  Stable remote superior endplate fracture of L2 and large Schmorl to noted involving L5. No worrisome bone lesions to suggest metastatic disease.

## 2022-04-14 NOTE — Assessment & Plan Note (Signed)
normal cortisol and ACTH  Probably due to poor oral intake.  Recommend to hold off Lasix and losartan.

## 2022-04-14 NOTE — Patient Instructions (Signed)
[facility]  Discharge Instructions: Thank you for choosing [facility] to provide your oncology and hematology care.  If you have a lab appointment with the [facility], please go directly to the [facility] and check in at the registration area.  Wear comfortable clothing and clothing appropriate for easy access to any Portacath or PICC line.   We strive to give you quality time with your provider. You may need to reschedule your appointment if you arrive late (15 or more minutes).  Arriving late affects you and other patients whose appointments are after yours.  Also, if you miss three or more appointments without notifying the office, you may be dismissed from the clinic at the provider's discretion.      For prescription refill requests, have your pharmacy contact our office and allow 72 hours for refills to be completed.    Today you received the following chemotherapy and/or immunotherapy agents Keytruda       To help prevent nausea and vomiting after your treatment, we encourage you to take your nausea medication as directed.  BELOW ARE SYMPTOMS THAT SHOULD BE REPORTED IMMEDIATELY: *FEVER GREATER THAN 100.4 F (38 C) OR HIGHER *CHILLS OR SWEATING *NAUSEA AND VOMITING THAT IS NOT CONTROLLED WITH YOUR NAUSEA MEDICATION *UNUSUAL SHORTNESS OF BREATH *UNUSUAL BRUISING OR BLEEDING *URINARY PROBLEMS (pain or burning when urinating, or frequent urination) *BOWEL PROBLEMS (unusual diarrhea, constipation, pain near the anus) TENDERNESS IN MOUTH AND THROAT WITH OR WITHOUT PRESENCE OF ULCERS (sore throat, sores in mouth, or a toothache) UNUSUAL RASH, SWELLING OR PAIN  UNUSUAL VAGINAL DISCHARGE OR ITCHING   Items with * indicate a potential emergency and should be followed up as soon as possible or go to the Emergency Department if any problems should occur.  Please show the CHEMOTHERAPY ALERT CARD or IMMUNOTHERAPY ALERT CARD at check-in to  the Emergency Department and triage nurse.  Should you have questions after your visit or need to cancel or reschedule your appointment, please contact [facility]  [phone] and follow the prompts.  Office hours are 8:00 a.m. to 4:30 p.m. Monday - Friday. Please note that voicemails left after 4:00 p.m. may not be returned until the following business day.  We are closed weekends and major holidays. You have access to a nurse at all times for urgent questions. Please call the main number to the clinic [phone] and follow the prompts.  For any non-urgent questions, you may also contact your provider using MyChart. We now offer e-Visits for anyone 48 and older to request care online for non-urgent symptoms. For details visit [email or web address].   Also download the MyChart app! Go to the app store, search "MyChart", open the app, select [facility], and log in with your MyChart username and password.

## 2022-04-14 NOTE — Assessment & Plan Note (Signed)
MRI brain shows persistent microbleeds.   Off Eliquis 2.5mg  BID.

## 2022-04-14 NOTE — Assessment & Plan Note (Signed)
Hold Eliquis.  Follow-up with Dr. Mickeal Skinner

## 2022-04-14 NOTE — Assessment & Plan Note (Addendum)
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  PET scan images were reviewed with patient.- good partial response.  Hold  maintenance Alimta/Keytruda  -dose reduce Alimta 350mg /m2 Due to decrease PS, discontinue Alimta.  Labs are reviewed and discussed with patient. Proceed with Keytruda Repeat CT chest abdomen pelvis wo contrast

## 2022-04-14 NOTE — Assessment & Plan Note (Signed)
Back pain, MRI lumbar showed compression fracture Continue PRN percocet, recently added fentanyl patch recommend Flexeril  MRI lumbar showed DJD, L4-L5 synovial cyst, no new bone mets.  He may benefit from epidural steroid injections.

## 2022-04-14 NOTE — Assessment & Plan Note (Addendum)
folate is low Recommend folic acid supplementation.

## 2022-04-15 ENCOUNTER — Telehealth: Payer: Self-pay | Admitting: Pharmacist

## 2022-04-15 ENCOUNTER — Other Ambulatory Visit: Payer: Self-pay | Admitting: *Deleted

## 2022-04-15 ENCOUNTER — Inpatient Hospital Stay (HOSPITAL_BASED_OUTPATIENT_CLINIC_OR_DEPARTMENT_OTHER): Payer: PPO | Admitting: Hospice and Palliative Medicine

## 2022-04-15 DIAGNOSIS — G893 Neoplasm related pain (acute) (chronic): Secondary | ICD-10-CM

## 2022-04-15 DIAGNOSIS — C3412 Malignant neoplasm of upper lobe, left bronchus or lung: Secondary | ICD-10-CM

## 2022-04-15 DIAGNOSIS — Z515 Encounter for palliative care: Secondary | ICD-10-CM

## 2022-04-15 MED ORDER — TRAZODONE HCL 50 MG PO TABS: 50.0000 mg | ORAL_TABLET | Freq: Every evening | ORAL | 3 refills | Status: DC | PRN

## 2022-04-15 NOTE — Progress Notes (Signed)
Care Management & Coordination Services Pharmacy Team  Reason for Encounter: Appointment Reminder  Contacted patient to confirm in office appointment with Leata Mouse, PharmD on 04/16/22 at 10:00AM. Unsuccessful outreach. Left voicemail for patient to return call.  Future Appointments  Date Time Provider [place]  04/16/2022 10:00 AM Edythe Clarity, [place] [facility] None  04/21/2022 10:15 AM [name], MD [facility]  04/22/2022 10:30 AM Borders, Kirt Boys, NP [facility] None  04/23/2022  1:30 PM Rosalyn Gess, OT [facility] None  04/28/2022 11:30 AM [facility]  05/05/2022 12:45 PM [facility] LAB [facility] None  05/05/2022  1:00 PM [name], MD [facility] None  05/05/2022  1:15 PM [facility] INFUSION CHAIR 7 [facility] None  05/07/2022  4:00 PM [facility] [place] [facility] None  06/25/2022 10:00 AM Noreene Filbert, MD [facility] None  06/30/2022  2:00 PM Janina Mayo, MD [facility] None  04/02/2023  3:00 PM [facility]-NURSE HEALTH ADVISOR [facility] [address] [place]

## 2022-04-15 NOTE — Progress Notes (Signed)
Virtual Visit via Telephone Note  I connected with Micheal Donovan on 04/15/22 at  2:30 PM EST by telephone and verified that I am speaking with the correct person using two identifiers.  Location: Patient: Home Provider: Clinic   I discussed the limitations, risks, security and privacy concerns of performing an evaluation and management service by telephone and the availability of in person appointments. I also discussed with the patient that there may be a patient responsible charge related to this service. The patient expressed understanding and agreed to proceed.   History of Present Illness: Micheal Donovan is a 72 y.o. male with multiple medical problems including non-small cell lung cancer, history of subacute intraparenchymal hemorrhage, PE.  Patient is status post XRT.  He is on systemic chemo.  He is referred to palliative care to address goals and manage ongoing symptoms.    Observations/Objective: I spoke with patient and wife by phone.  Patient saw Dr. Tasia Catchings yesterday.  MRI lumbar spine shows stable remote compression fracture of L2 with large L5 Schmorl's node.  He has degenerative lumbar spondylosis and multilevel degenerative disc disease.  He has enlarging right-sided synovial cyst at L4-L5 with mass effect.  I spoke with neurosurgeon - Dr. Lacinda Axon but patient is a poor surgical candidate.  Recommendation was for trial of epidural steroid injection.  Patient has previously been seen by Ortho - Dr. Nelva Bush who has performed steroid injections as of last year.  Will refer back to Dr. Nelva Bush per patient's request.  Patient has appointment with DME supplier for LSO brace.  He continues to utilize fentanyl/Percocet with some improvement in pain.  Assessment and Plan: Neoplasm related pain -referral to Ortho.  Recommend LSO brace. Continue fentanyl/Percocet  Follow Up Instructions: Follow-up telephone visit 1 month   I discussed the assessment and treatment plan with the patient. The  patient was provided an opportunity to ask questions and all were answered. The patient agreed with the plan and demonstrated an understanding of the instructions.   The patient was advised to call back or seek an in-person evaluation if the symptoms worsen or if the condition fails to improve as anticipated.  I provided 10 minutes of non-face-to-face time during this encounter.   Irean Hong, NP

## 2022-04-16 ENCOUNTER — Ambulatory Visit: Payer: PPO | Admitting: Occupational Therapy

## 2022-04-16 ENCOUNTER — Ambulatory Visit: Payer: PPO | Admitting: Pharmacist

## 2022-04-16 NOTE — Progress Notes (Signed)
Care Management & Coordination Services Pharmacy Note  04/17/2022 Name:  Micheal Donovan MRN:  [id number] DOB:  1950-08-10  Summary: Initial visit with PharmD.  All medications reviewed.  Main concern was burning pain in feet/legs.  Discussed possibility of increasing gabapentin dose.  Counseled on avoidance of hypoglycemia and controlling BP.  Recommendations/Changes made from today's visit: Consult Yu for gabapentin increase  Follow up plan: FU 6 months   Subjective: Micheal Donovan is an 72 y.o. year old male who is a primary patient of Pickard, Cammie Mcgee, MD.  The care coordination team was consulted for assistance with disease management and care coordination needs.    Engaged with patient by telephone for initial visit.  Recent office visits:  03/07/2022 OV (PCP) [name], MD; will start valsartan 80 mg a day for treatment of heart failure. I will defer adding Jardiance to his follow-up appointment with his cardiologist.   start him on fentanyl 25 mcg transdermal every 72 hours to try to get better control of his pain. He can use Percocet for breakthrough pain.    02/14/2022 OV (PCP) [name], MD; I gave the patient Lasix today 20 mg daily as needed for swelling. I recommended that he take his weight every day and if he gains more than 2 pounds in 24 hours he use Lasix. We also discussed treatment for congestive heart failure including beta-blockers, ARB, Entresto, spironolactone, and SGLT2 inhibitors. I suggested that we start the patient on Entresto. However the patient would like to discuss the situation with his oncologist and see a cardiologist for a second opinion. Therefore I will arrange cardiology consultation.    02/11/2022 OV (PCP) [name], MD; no medication changes indicated.   01/29/2022 OV (PCP) [name], MD; I gave the patient a sliding scale for rapid acting insulin as I anticipate that the prednisone will likely exacerbate his glycemic  control. Sliding scale starts at 150 and prescribes 2 units of insulin for every 50 points greater than 150.    01/01/2022 OV (PCP) [name], MD; I given the patient a prednisone taper pack and affect to mimic a cortisone injection in his lower back.    12/10/2021 OV (PCP) [name], MD; I will give the patient lorazepam 1 mg p.o. nightly to try to help with his insomnia. Consider also trying something like Vesicare to help with overactive bladder to maybe reduce some of the nocturnal frequency    Recent consult visits:  03/26/2022 OV (Oncology) Borders, Kirt Boys, NP; Conjunctivitis -start gentamicin ophthalmic solution.   03/25/2022 OV (Oncology) Verlon Au, NP; no office note available.   03/25/2022 OV (Cardiology) Janina Mayo, MD; no medication changes indicated.   03/18/2022 OV (Oncology) Borders, Kirt Boys, NP; Start PRN trazodone    03/18/2022 OV (Oncology) [name], MD; no medication changes noted.   02/25/2022 OV (Oncology) [name], MD; Currently on 60 mg of prednisone. Glucoses trending higher. I recommend patient to taper down to 20 mg daily for 3 days followed by 10 mg for 3 days and then stop.    02/21/2022 OV (Oncology) Hughie Closs, PA-C; . For now he will continue on his doxycycline, start his prednisone and continue the strong topical steroid prescribed by his dermatologist    02/21/2022 OV (Oncology) Ventura Sellers, MD; no medication changes noted.   02/19/2022 OV (Oncology) Hughie Closs, PA-C; Worsening per patient even on doxycyline (though has only been on this  medication for less than 48 hours). May be secondary to his Keytruda. Starting on prednisone and topical steroids.    02/17/2022 OV (Oncology) Hughie Closs, PA-C; For now doxycyline x 10 days.   02/13/2022 OV (Oncology) [name], MD;  Recommend daily Zarxio 350mcg x2 , I recommend him to take Claritin 10mg  daily x 4 days.  Stop Eliquis 2.5mg  BID.    02/03/2022 OV  (Oncology) [name], MD; Proceed with Alimta/Keytruda today , Hold Eliquis. Refer to neurology.   01/13/2022 OV (Oncology) [name], MD; Stop Eliquis 2.5mg  BID. Recommend Zometa every 4- 6 weeks. Recommend calcium supplementation.   12/23/2021 OV (Oncology) [name], MD; Stop Eliquis 2.5mg  BID. Recommend Zometa every 4- 6 weeks. Recommend calcium supplementation.   12/02/2021 OV (Oncology) [name], MD; Recommend Zometa every 4- 6 weeks. Recommend calcium supplementation.   11/11/2021 OV (Oncology) [name], MD; Hypokalemia, recommend potassium supplementation 16meq daily Recommend Zometa every 4- 6 weeks. Recommend calcium supplementation. Recommend trial of Flowmax, if no improvement, will refer to urology.   11/01/2021 OV (Oncology) Borders, Kirt Boys, NP;  recommended Dulcolax daily.   10/21/2021 OV (Oncology) [name], MD;  Eliquis is decreased to 2.5mg  BID    Hospital visits:  12/17/2021 MRI BRAIN WITH AND WITHOUT CONTRAST WITH ANESTHESIA   11/18/2021 ED to Hospital Admission due to Neutropenic fever -No medication changes indicated.   Objective:  Lab Results  Component Value Date   CREATININE 1.22 04/14/2022   BUN 18 04/14/2022   EGFR 74 02/11/2022   GFRNONAA >60 04/14/2022   GFRAA >89 03/05/2016   NA 132 (L) 04/14/2022   K 4.2 04/14/2022   CALCIUM 8.3 (L) 04/14/2022   CO2 22 04/14/2022   GLUCOSE 169 (H) 04/14/2022    Lab Results  Component Value Date/Time   HGBA1C 7.7 (H) 12/10/2021 02:20 PM   HGBA1C 13.6 (H) 06/10/2021 12:10 PM   MICROALBUR 0.9 03/06/2016 10:10 AM    Last diabetic Eye exam:  Lab Results  Component Value Date/Time   HMDIABEYEEXA Retinopathy (A) 07/20/2015 11:31 AM    Last diabetic Foot exam: No results found for: "HMDIABFOOTEX"   Lab Results  Component Value Date   CHOL 266 (H) 06/12/2021   HDL 24 (L) 06/12/2021   LDLCALC 213 (H) 06/12/2021   TRIG 144 06/12/2021   CHOLHDL 11.1 06/12/2021       Latest Ref Rng & Units 04/14/2022   10:14 AM  04/04/2022   10:07 AM 03/25/2022    3:00 PM  Hepatic Function  Total Protein 6.5 - 8.1 g/dL 6.2  6.1  6.4   Albumin 3.5 - 5.0 g/dL 2.9  2.8  3.0   AST 15 - 41 U/L 25  30  42   ALT 0 - 44 U/L 12  14  28    Alk Phosphatase 38 - 126 U/L 101  112  135   Total Bilirubin 0.3 - 1.2 mg/dL 0.2  0.3  0.3     Lab Results  Component Value Date/Time   TSH 1.438 02/03/2022 09:58 AM   TSH 1.783 12/02/2021 01:05 PM   TSH 1.02 07/10/2015 08:08 AM   TSH 1.131 06/19/2014 08:08 AM   FREET4 0.62 10/21/2021 08:16 AM   FREET4 0.64 09/27/2021 08:16 AM       Latest Ref Rng & Units 04/14/2022   10:14 AM 04/04/2022   10:07 AM 03/25/2022    3:00 PM  CBC  WBC 4.0 - 10.5 K/uL 12.6  13.2  21.5  Hemoglobin 13.0 - 17.0 g/dL 8.7  8.6  9.6   Hematocrit 39.0 - 52.0 % 27.0  25.5  28.1   Platelets 150 - 400 K/uL 375  355  360     Lab Results  Component Value Date/Time   VD25OH 35.27 06/21/2021 05:05 AM   VD25OH 58 07/13/2012 08:25 AM   VITAMINB12 3,412 (H) 03/18/2022 10:08 AM   VITAMINB12 290 01/13/2022 09:02 AM    Clinical ASCVD: Yes  The 10-year ASCVD risk score (Arnett DK, et al., 2019) is: 35.5%   Values used to calculate the score:     Age: 83 years     Sex: Male     Is Non-Hispanic African American: No     Diabetic: Yes     Tobacco smoker: No     Systolic Blood Pressure: 93 mmHg     Is BP treated: Yes     HDL Cholesterol: 24 mg/dL     Total Cholesterol: 266 mg/dL        03/27/2022    3:29 PM 02/11/2022    2:50 PM 01/29/2022    9:00 AM  Depression screen PHQ 2/9  Decreased Interest 0 0 0  Down, Depressed, Hopeless 0 0 0  PHQ - 2 Score 0 0 0  Altered sleeping  0 0  Tired, decreased energy  0 0  Change in appetite  0 0  Feeling bad or failure about yourself   0 0  Trouble concentrating  0 0  Moving slowly or fidgety/restless  0 0  Suicidal thoughts  0 0  PHQ-9 Score  0 0  Difficult doing work/chores  Not difficult at all Not difficult at all     Social History   Tobacco Use   Smoking Status Former   Types: Cigarettes   Quit date: 07/21/1990   Years since quitting: 31.7  Smokeless Tobacco Never   BP Readings from Last 3 Encounters:  04/14/22 (!) 93/59  04/08/22 124/69  04/04/22 95/61   Pulse Readings from Last 3 Encounters:  04/14/22 78  04/08/22 86  04/04/22 91   Wt Readings from Last 3 Encounters:  04/14/22 154 lb (69.9 kg)  04/08/22 150 lb (68 kg)  04/04/22 153 lb 8 oz (69.6 kg)   BMI Readings from Last 3 Encounters:  04/14/22 24.12 kg/m  04/08/22 23.49 kg/m  04/04/22 24.04 kg/m    Allergies  Allergen Reactions   Niaspan [Niacin]     FLUSHING    Medications Reviewed Today     Reviewed by Edythe Clarity, RPH (Pharmacist) on 04/17/22 at 52  Med List Status: <None>   Medication Order Taking? Sig Documenting Provider Last Dose Status Informant  aspirin EC 81 MG tablet [id number] Yes Take 1 tablet (81 mg total) by mouth daily. Swallow whole. Ventura Sellers, MD Taking Active   atorvastatin (LIPITOR) 40 MG tablet [id number] Yes Take 1 tablet (40 mg total) by mouth daily. Ventura Sellers, MD Taking Active   Ca Phosphate-Cholecalciferol (CALCIUM WITH D3 PO) [id number]  Take 1 capsule by mouth daily. [provider]  Active   cyclobenzaprine (FLEXERIL) 5 MG tablet [id number] No Take 1 tablet (5 mg total) by mouth 2 (two) times daily as needed for muscle spasms.  Patient not taking: Reported on 04/16/2022   [name], MD Not Taking Active            Med Note Guadlupe Spanish Apr 07, 2022  9:24 AM) Patient is not currently  taking this medication.  dronabinol (MARINOL) 5 MG capsule [id number] No Take 1 capsule (5 mg total) by mouth in the morning and at bedtime.  Patient not taking: Reported on 04/14/2022   [name], MD Not Taking Active            Med Note Guadlupe Spanish Apr 07, 2022  9:25 AM) Patient is not currently taking this medication.  fentaNYL (DURAGESIC) 50 MCG/HR [id number] Yes Place 1 patch onto the skin every  3 (three) days. [name], MD Taking Active   folic acid (FOLVITE) 425 MCG tablet [id number] Yes Take 1 tablet (800 mcg total) by mouth daily. [name], MD Taking Active   furosemide (LASIX) 20 MG tablet [id number] Yes Take 1 tablet (20 mg total) by mouth daily as needed (leg swelling or sob). [name], MD Taking Active   gabapentin (NEURONTIN) 100 MG capsule [id number] Yes Take 1 capsule (100 mg total) by mouth 3 (three) times daily. [name], MD Taking Active   HYDROcodone bit-homatropine [facility]) 5-1.5 MG/5ML syrup [id number]  Take 5 mLs by mouth every 8 (eight) hours as needed for cough.  Patient not taking: Reported on 02/25/2022   [name], MD  Active   insulin glargine (LANTUS) 100 UNIT/ML injection [id number] Yes Inject 30 Units into the skin daily as needed. Only takes Lantus when sugar is above 150. [provider] Taking Active   Iron-Vitamin C 65-125 MG TABS [id number]  Take 1 tablet by mouth daily. [name], MD  Active            Med Note Valere Dross, Cyndee Brightly Apr 07, 2022  9:27 AM) Patient has not filled the rx as of today, waiting on insurance   lidocaine (LIDODERM) 5 % [id number] No Place 1 patch onto the skin daily. Remove & Discard patch within 12 hours or as directed by MD  Patient not taking: Reported on 04/16/2022   Borders, Kirt Boys, NP Not Taking Active            Med Note Guadlupe Spanish Apr 07, 2022  9:28 AM) Patient has not filled the rx as of today, waiting on insurance   LORazepam (ATIVAN) 1 MG tablet [id number] No Take 1 tablet (1 mg total) by mouth at bedtime.  Patient not taking: Reported on 04/14/2022   [name], MD Not Taking Active   NOVOLOG FLEXPEN 100 UNIT/ML FlexPen [id number] Yes Inject 0-10 Units into the skin daily as needed for high blood sugar (above 200). Sliding scale [provider] Taking Active Self  omeprazole (PRILOSEC) 20 MG capsule [id number] Yes Take 20 mg by mouth daily. [provider] Taking  Active Self  oxyCODONE-acetaminophen (PERCOCET) 7.5-325 MG tablet [id number] Yes Take 1 tablet by mouth every 4 (four) hours as needed for severe pain. Do not take with cough medication or other pain medication [name], MD Taking Active   Probiotic Product (PROBIOTIC DAILY PO) [id number] Yes Take by mouth. Physician's Choice Probiotic [provider] Taking Active     Discontinued 08/06/21 1639   tamsulosin (FLOMAX) 0.4 MG CAPS capsule [id number] Yes TAKE 1 CAPSULE(0.4 MG) BY MOUTH DAILY [name], MD Taking Active Self  traZODone (DESYREL) 50 MG tablet [id number] Yes Take 1 tablet (50 mg total) by mouth at bedtime as needed for sleep. Borders, Kirt Boys, NP Taking Active   triamcinolone ointment (KENALOG) 0.5 % [id number]  Apply 1 Application topically 2 (two)  times daily.  Patient not taking: Reported on 03/25/2022   Vallery Sa  Active   UNABLE TO FIND [id number]  PLEASE CHECK FASTING BLOOD GLUCOSE EVERY MORNING [name], MD  Active Self  valsartan (DIOVAN) 80 MG tablet [id number] Yes Take 1 tablet (80 mg total) by mouth daily. [name], MD Taking Active             SDOH:  (Social Determinants of Health) assessments and interventions performed: Yes Financial Resource Strain: Low Risk  (04/17/2022)   Overall Financial Resource Strain (CARDIA)    Difficulty of Paying Living Expenses: Not hard at all   Food Insecurity: No Food Insecurity (04/17/2022)   Hunger Vital Sign    Worried About Running Out of Food in the Last Year: Never true    Ran Out of Food in the Last Year: Never true    SDOH Interventions    Flowsheet Row Clinical Support from 03/27/2022 in [place] Telephone from 11/21/2021 in [place] from 07/31/2021 in [place] at [facility] Interventions Intervention Not Indicated -- Intervention Not  Indicated  Housing Interventions Intervention Not Indicated -- Intervention Not Indicated  Transportation Interventions Intervention Not Indicated Intervention Not Indicated CCAR Lucianne Lei ([place]. Only), Patient Resources (Friends/Family)  Utilities Interventions Intervention Not Indicated -- --  Alcohol Usage Interventions Intervention Not Indicated (Score <7) -- --  Financial Strain Interventions Intervention Not Indicated Intervention Not Indicated Intervention Not Indicated  Physical Activity Interventions Intervention Not Indicated -- Intervention Not Indicated  Stress Interventions Intervention Not Indicated -- --  Social Connections Interventions Intervention Not Indicated -- Intervention Not Indicated       Medication Assistance: None required.  Patient affirms current coverage meets needs.  Medication Access: Within the past 30 days, how often has patient missed a dose of medication? 0 Is a pillbox or other method used to improve adherence? Yes  Factors that may affect medication adherence? no barriers identified Are meds synced by current pharmacy? No  Are meds delivered by current pharmacy? No  Does patient experience delays in picking up medications due to transportation concerns? No   [facility] Services Reviewed: Is patient disadvantaged to use [facility]?: No  Current Rx insurance plan: HTA Name and location of Current pharmacy:  [facility] [address], [address] [place] [address] Phone: [phone] Fax: [phone]  [facility] services reviewed with patient today?: Yes  Patient requests to transfer care to [facility]?: No  Reason patient declined to change pharmacies: Loyalty to other pharmacy/Patient preference  Compliance/Adherence/Medication fill history: Star Rating Drugs:  Atorvastatin 40 mg last filled 02/21/2022 90 DS Lantus last filled  03/27/2022 44 DS Valsartan 80 mg last filled 03/07/2022 90 DS   Care Gaps: Annual wellness visit in last year? No   If Diabetic: Last eye exam / retinopathy screening: Last diabetic foot exam:   Assessment/Plan   Hypertension (BP goal <130/80) -Controlled -Current treatment: Valsartan 80mg  Appropriate, Effective, Safe, Accessible -Medications previously tried: none noted  -Current home readings: 135/80, 110/80,  -Denies hypotensive/hypertensive symptoms -Educated on BP goals and benefits of medications for prevention of heart attack, stroke and kidney damage; Importance of home blood pressure monitoring; -Counseled to monitor BP at home as current, document, and provide  log at future appointments -Recommended to continue current medication He declined to see cardiology at this time.  PCP had previously recommended Entresto but looks like he was started on valsartan instead.  Hyperlipidemia: (LDL goal < 70) -Uncontrolled -Current treatment: Atorvastatin 40mg  Appropriate, Query effective,  -Medications previously tried: none noted   -Educated on Cholesterol goals;  Benefits of statin for ASCVD risk reduction; Has not had lipid panel since starting statin, recommend recheck lipids -Recommended to continue current medication ASCVD risk is high at 35.5%.  Recheck lipids and assess statin at this time.  Diabetes (A1c goal <8%) -Controlled -Current medications: Novolog 10 units tid prn Appropriate, Effective, Safe, Accessible Lantus 30 units daily prn Appropriate, Effective, Safe, Accessible -Medications previously tried: none noted  -Current home glucose readings fasting glucose: no logs discussed post prandial glucose: no logs discussed -Denies hypoglycemic/hyperglycemic symptoms  -Educated on A1c and blood sugar goals; Prevention and management of hypoglycemic episodes; Benefits of routine self-monitoring of blood sugar; -Counseled to check feet daily and get yearly eye  exams -Recommended to continue current medication Cautioned to avoid hypoglycemia.  Mentions burning pain, currently on 100mg  of gabaoentin tid.  Recommended they contact MD who prescribes to discuss increase to 300mg  tid.  Non-Small Cell Carcinoma Left lung (Goal: Minimize symptoms) -Controlled -Current treatment  Fentanyl 51mcg Appropriate, Effective, Safe, Accessible Oxycodone/APAP 7.5-325 Appropriate, Effective, Safe, Accessible -Medications previously tried: none noted -Using Percocet as breakthrough.  Says Fentanyl has helped the pain.  -Recommended to continue current medication  [name] Milch, PharmD, CPP Clinical Pharmacist Practitioner [place] [phone]

## 2022-04-17 ENCOUNTER — Encounter: Payer: Self-pay | Admitting: *Deleted

## 2022-04-17 ENCOUNTER — Telehealth: Payer: Self-pay | Admitting: *Deleted

## 2022-04-17 NOTE — Telephone Encounter (Signed)
[place] for pt to increase gabapentin to 300 mg TID ?

## 2022-04-17 NOTE — Telephone Encounter (Signed)
Referral to [facility]- Dr. Nelva Bush faxed. Pt being referred for chronic back pain and consideration of spinal injection. Fax confirmation rcvd for fax

## 2022-04-20 ENCOUNTER — Encounter: Payer: Self-pay | Admitting: Hospice and Palliative Medicine

## 2022-04-21 ENCOUNTER — Ambulatory Visit (INDEPENDENT_AMBULATORY_CARE_PROVIDER_SITE_OTHER): Payer: PPO | Admitting: Family Medicine

## 2022-04-21 ENCOUNTER — Encounter: Payer: Self-pay | Admitting: Family Medicine

## 2022-04-21 VITALS — BP 102/56 | HR 72 | Temp 98.7°F | Ht 67.0 in | Wt 157.0 lb

## 2022-04-21 DIAGNOSIS — S32029D Unspecified fracture of second lumbar vertebra, subsequent encounter for fracture with routine healing: Secondary | ICD-10-CM | POA: Diagnosis not present

## 2022-04-21 DIAGNOSIS — M47816 Spondylosis without myelopathy or radiculopathy, lumbar region: Secondary | ICD-10-CM | POA: Diagnosis not present

## 2022-04-21 DIAGNOSIS — S32029A Unspecified fracture of second lumbar vertebra, initial encounter for closed fracture: Secondary | ICD-10-CM

## 2022-04-21 MED ORDER — FENTANYL 75 MCG/HR TD PT72: 1.0000 | MEDICATED_PATCH | TRANSDERMAL | 0 refills | Status: DC

## 2022-04-21 NOTE — Progress Notes (Signed)
Subjective:    Patient ID: Micheal Donovan, male    DOB: 1950-05-27, 72 y.o.   MRN: [id number]  Back Pain   Patient is a 72 year old Caucasian gentleman with a history of metastatic lung cancer who recently had an MRI on his lower back: IMPRESSION: 1. No evidence of regional metastatic disease. 2. Late subacute superior endplate fracture at L2 and large superior endplate Schmorl's node at L5, unchanged since the PET scan of 10/29/2021. 3. L3-4: Shallow disc protrusion. Facet and ligamentous hypertrophy. Stenosis of both lateral recesses. Findings slightly worsened since 2022. 4. L4-5: Shallow disc protrusion. Facet and ligamentous hypertrophy. Small synovial cyst arising from the facet joint on the right. Stenosis of the lateral recesses right worse than left. Foraminal narrowing right worse than left. Findings have worsened since 2022. 5. L5-S1: Endplate osteophytes and shallow protrusion of the disc. Facet and ligamentous hypertrophy. Stenosis of the subarticular lateral recesses and neural foramina, right worse than left. Similar appearance to the study of 2022. 6. Continued evidence of enteritis of at least 1 loop of small bowel. Distended bladder present.  He reports severe pain in his back.  Start the patient on fentanyl and we gradually uptitrated to 50 mcg every 72 hours.  This has helped him substantially although he continues to have severe pain.  This keeps him from being able to enjoy doing activities around his home.  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  If he has to bend over or move around, the pain becomes severe.  He refuses to take oxycodone for breakthrough.  He is certainly not overmedicated.  He is awake and alert and answering questions appropriately.  His wife states that he is not overmedicated at home and that he is suffering more from back pain and he is indicating today. Past Medical History:   Diagnosis Date   Allergy    BPH (benign prostatic hypertrophy)    Cancer (HCC)    Melanoma on Neck    2008   Carotid artery occlusion    CHF (congestive heart failure) (HCC)    Diabetes mellitus    type 2   ED (erectile dysfunction)    GERD (gastroesophageal reflux disease)    Hemorrhagic stroke ([place]) 06/2021   Hyperlipidemia    Hypertension    Lung cancer ([place])    Neoplasm related pain    Retinopathy due to secondary DM [facility])    Past Surgical History:  Procedure Laterality Date   BRONCHIAL NEEDLE ASPIRATION BIOPSY  06/17/2021   Procedure: BRONCHIAL NEEDLE ASPIRATION BIOPSIES;  Surgeon: Candee Furbish, MD;  Location: [place];  Service: Pulmonary;;   IR IMAGING GUIDED PORT INSERTION  08/05/2021   MELANOMA EXCISION  2008   Left side of neck   RADIOLOGY WITH ANESTHESIA N/A 04/05/2020   Procedure: MRI SPINE WITOUT CONTRAST;  Surgeon: Radiologist, Medication, MD;  Location: [place];  Service: Radiology;  Laterality: N/A;   RADIOLOGY WITH ANESTHESIA N/A 08/22/2021   Procedure: MRI BRAIN WITH AND WITHOUT CONTRAST  WITH ANESTHESIA;  Surgeon: Radiologist, Medication, MD;  Location: [place];  Service: Radiology;  Laterality: N/A;   RADIOLOGY WITH ANESTHESIA N/A 12/17/2021   Procedure: MRI BRAIN WITH AND WITHOUT CONTRAST WITH ANESTHESIA; MRI LUMBER WITH AND WITHOUT CONTRAST;  Surgeon: Radiologist, Medication, MD;  Location: [place];  Service: Radiology;  Laterality: N/A;   RADIOLOGY WITH ANESTHESIA N/A 04/08/2022   Procedure: MRI  LUMBER SPINE WITH AND WITHOUT CONTRAST;  Surgeon: Radiologist, Medication, MD;  Location: [place];  Service: Radiology;  Laterality: N/A;   TONSILLECTOMY     VIDEO BRONCHOSCOPY WITH ENDOBRONCHIAL ULTRASOUND N/A 06/17/2021   Procedure: VIDEO BRONCHOSCOPY WITH ENDOBRONCHIAL ULTRASOUND;  Surgeon: Candee Furbish, MD;  Location: [facility] ENDOSCOPY;  Service: Pulmonary;  Laterality: N/A;   Current Outpatient Medications on File Prior to Visit  Medication Sig Dispense Refill    aspirin EC 81 MG tablet Take 1 tablet (81 mg total) by mouth daily. Swallow whole. 30 tablet 12   atorvastatin (LIPITOR) 40 MG tablet Take 1 tablet (40 mg total) by mouth daily. 60 tablet 2   Ca Phosphate-Cholecalciferol (CALCIUM WITH D3 PO) Take 1 capsule by mouth daily.     folic acid (FOLVITE) 893 MCG tablet Take 1 tablet (800 mcg total) by mouth daily. 60 tablet 1   furosemide (LASIX) 20 MG tablet Take 1 tablet (20 mg total) by mouth daily as needed (leg swelling or sob). 30 tablet 3   gabapentin (NEURONTIN) 100 MG capsule Take 1 capsule (100 mg total) by mouth 3 (three) times daily. 90 capsule 0   insulin glargine (LANTUS) 100 UNIT/ML injection Inject 30 Units into the skin daily as needed. Only takes Lantus when sugar is above 150.     neomycin-polymyxin b-dexamethasone (MAXITROL) 3.5-10000-0.1 SUSP 1 drop 3 (three) times daily.     NOVOLOG FLEXPEN 100 UNIT/ML FlexPen Inject 0-10 Units into the skin daily as needed for high blood sugar (above 200). Sliding scale     omeprazole (PRILOSEC) 20 MG capsule Take 20 mg by mouth daily.     oxyCODONE-acetaminophen (PERCOCET) 7.5-325 MG tablet Take 1 tablet by mouth every 4 (four) hours as needed for severe pain. Do not take with cough medication or other pain medication 60 tablet 0   Probiotic Product (PROBIOTIC DAILY PO) Take by mouth. Physician's Choice Probiotic     tamsulosin (FLOMAX) 0.4 MG CAPS capsule TAKE 1 CAPSULE(0.4 MG) BY MOUTH DAILY 90 capsule 0   traZODone (DESYREL) 50 MG tablet Take 1 tablet (50 mg total) by mouth at bedtime as needed for sleep. 30 tablet 3   UNABLE TO FIND PLEASE CHECK FASTING BLOOD GLUCOSE EVERY MORNING 1 each 0   valsartan (DIOVAN) 80 MG tablet Take 1 tablet (80 mg total) by mouth daily. 90 tablet 3   [DISCONTINUED] prochlorperazine (COMPAZINE) 10 MG tablet Take 1 tablet (10 mg total) by mouth every 6 (six) hours as needed (Nausea or vomiting). 30 tablet 1   Current Facility-Administered Medications on File Prior to  Visit  Medication Dose Route Frequency Provider Last Rate Last Admin   heparin lock flush 100 UNIT/ML injection              Allergies  Allergen Reactions   Niaspan [Niacin]     FLUSHING   Social History   Socioeconomic History   Marital status: Married    Spouse name: Not on file   Number of children: Not on file   Years of education: Not on file   Highest education level: Not on file  Occupational History   Not on file  Tobacco Use   Smoking status: Former    Types: Cigarettes    Quit date: 07/21/1990    Years since quitting: 31.7   Smokeless tobacco: Never  Vaping Use   Vaping Use: Never used  Substance and Sexual Activity   Alcohol use: No   Drug use: No   Sexual  activity: Not Currently  Other Topics Concern   Not on file  Social History Narrative   Not on file   Social Determinants of Health   Financial Resource Strain: Low Risk  (04/17/2022)   Overall Financial Resource Strain (CARDIA)    Difficulty of Paying Living Expenses: Not hard at all  Food Insecurity: No Food Insecurity (04/17/2022)   Hunger Vital Sign    Worried About Running Out of Food in the Last Year: Never true    Ran Out of Food in the Last Year: Never true  Transportation Needs: No Transportation Needs (03/27/2022)   PRAPARE - Hydrologist (Medical): No    Lack of Transportation (Non-Medical): No  Physical Activity: Inactive (03/27/2022)   Exercise Vital Sign    Days of Exercise per Week: 0 days    Minutes of Exercise per Session: 0 min  Stress: Stress Concern Present (03/27/2022)   [place]    Feeling of Stress : To some extent  Social Connections: Moderately Isolated (03/27/2022)   Social Connection and Isolation Panel [NHANES]    Frequency of Communication with Friends and Family: More than three times a week    Frequency of Social Gatherings with Friends and Family: Three times a week     Attends Religious Services: Never    Active Member of Clubs or Organizations: No    Attends Archivist Meetings: Never    Marital Status: Married  Human resources officer Violence: Not At Risk (03/27/2022)   Humiliation, Afraid, Rape, and Kick questionnaire    Fear of Current or Ex-Partner: No    Emotionally Abused: No    Physically Abused: No    Sexually Abused: No    Past Medical History:  Diagnosis Date   Allergy    BPH (benign prostatic hypertrophy)    Cancer ([place])    Melanoma on Neck    2008   Carotid artery occlusion    CHF (congestive heart failure) ([place])    Diabetes mellitus    type 2   ED (erectile dysfunction)    GERD (gastroesophageal reflux disease)    Hemorrhagic stroke ([place]) 06/2021   Hyperlipidemia    Hypertension    Lung cancer ([place])    Neoplasm related pain    Retinopathy due to secondary DM [facility])    Past Surgical History:  Procedure Laterality Date   BRONCHIAL NEEDLE ASPIRATION BIOPSY  06/17/2021   Procedure: BRONCHIAL NEEDLE ASPIRATION BIOPSIES;  Surgeon: Candee Furbish, MD;  Location: [place];  Service: Pulmonary;;   IR IMAGING GUIDED PORT INSERTION  08/05/2021   MELANOMA EXCISION  2008   Left side of neck   RADIOLOGY WITH ANESTHESIA N/A 04/05/2020   Procedure: MRI SPINE WITOUT CONTRAST;  Surgeon: Radiologist, Medication, MD;  Location: [place];  Service: Radiology;  Laterality: N/A;   RADIOLOGY WITH ANESTHESIA N/A 08/22/2021   Procedure: MRI BRAIN WITH AND WITHOUT CONTRAST  WITH ANESTHESIA;  Surgeon: Radiologist, Medication, MD;  Location: [place];  Service: Radiology;  Laterality: N/A;   RADIOLOGY WITH ANESTHESIA N/A 12/17/2021   Procedure: MRI BRAIN WITH AND WITHOUT CONTRAST WITH ANESTHESIA; MRI LUMBER WITH AND WITHOUT CONTRAST;  Surgeon: Radiologist, Medication, MD;  Location: [place];  Service: Radiology;  Laterality: N/A;   RADIOLOGY WITH ANESTHESIA N/A 04/08/2022   Procedure: MRI LUMBER SPINE WITH AND WITHOUT CONTRAST;  Surgeon: Radiologist,  Medication, MD;  Location: [place];  Service: Radiology;  Laterality: N/A;  TONSILLECTOMY     VIDEO BRONCHOSCOPY WITH ENDOBRONCHIAL ULTRASOUND N/A 06/17/2021   Procedure: VIDEO BRONCHOSCOPY WITH ENDOBRONCHIAL ULTRASOUND;  Surgeon: Candee Furbish, MD;  Location: [facility] ENDOSCOPY;  Service: Pulmonary;  Laterality: N/A;   Current Outpatient Medications on File Prior to Visit  Medication Sig Dispense Refill   aspirin EC 81 MG tablet Take 1 tablet (81 mg total) by mouth daily. Swallow whole. 30 tablet 12   atorvastatin (LIPITOR) 40 MG tablet Take 1 tablet (40 mg total) by mouth daily. 60 tablet 2   Ca Phosphate-Cholecalciferol (CALCIUM WITH D3 PO) Take 1 capsule by mouth daily.     folic acid (FOLVITE) 332 MCG tablet Take 1 tablet (800 mcg total) by mouth daily. 60 tablet 1   furosemide (LASIX) 20 MG tablet Take 1 tablet (20 mg total) by mouth daily as needed (leg swelling or sob). 30 tablet 3   gabapentin (NEURONTIN) 100 MG capsule Take 1 capsule (100 mg total) by mouth 3 (three) times daily. 90 capsule 0   insulin glargine (LANTUS) 100 UNIT/ML injection Inject 30 Units into the skin daily as needed. Only takes Lantus when sugar is above 150.     neomycin-polymyxin b-dexamethasone (MAXITROL) 3.5-10000-0.1 SUSP 1 drop 3 (three) times daily.     NOVOLOG FLEXPEN 100 UNIT/ML FlexPen Inject 0-10 Units into the skin daily as needed for high blood sugar (above 200). Sliding scale     omeprazole (PRILOSEC) 20 MG capsule Take 20 mg by mouth daily.     oxyCODONE-acetaminophen (PERCOCET) 7.5-325 MG tablet Take 1 tablet by mouth every 4 (four) hours as needed for severe pain. Do not take with cough medication or other pain medication 60 tablet 0   Probiotic Product (PROBIOTIC DAILY PO) Take by mouth. Physician's Choice Probiotic     tamsulosin (FLOMAX) 0.4 MG CAPS capsule TAKE 1 CAPSULE(0.4 MG) BY MOUTH DAILY 90 capsule 0   traZODone (DESYREL) 50 MG tablet Take 1 tablet (50 mg total) by mouth at bedtime as needed for  sleep. 30 tablet 3   UNABLE TO FIND PLEASE CHECK FASTING BLOOD GLUCOSE EVERY MORNING 1 each 0   valsartan (DIOVAN) 80 MG tablet Take 1 tablet (80 mg total) by mouth daily. 90 tablet 3   [DISCONTINUED] prochlorperazine (COMPAZINE) 10 MG tablet Take 1 tablet (10 mg total) by mouth every 6 (six) hours as needed (Nausea or vomiting). 30 tablet 1   Current Facility-Administered Medications on File Prior to Visit  Medication Dose Route Frequency Provider Last Rate Last Admin   heparin lock flush 100 UNIT/ML injection            Allergies  Allergen Reactions   Niaspan [Niacin]     FLUSHING   Social History   Socioeconomic History   Marital status: Married    Spouse name: Not on file   Number of children: Not on file   Years of education: Not on file   Highest education level: Not on file  Occupational History   Not on file  Tobacco Use   Smoking status: Former    Types: Cigarettes    Quit date: 07/21/1990    Years since quitting: 31.7   Smokeless tobacco: Never  Vaping Use   Vaping Use: Never used  Substance and Sexual Activity   Alcohol use: No   Drug use: No   Sexual activity: Not Currently  Other Topics Concern   Not on file  Social History Narrative   Not on file   Social Determinants of  Health   Financial Resource Strain: Low Risk  (04/17/2022)   Overall Financial Resource Strain (CARDIA)    Difficulty of Paying Living Expenses: Not hard at all  Food Insecurity: No Food Insecurity (04/17/2022)   Hunger Vital Sign    Worried About Running Out of Food in the Last Year: Never true    Ran Out of Food in the Last Year: Never true  Transportation Needs: No Transportation Needs (03/27/2022)   PRAPARE - Hydrologist (Medical): No    Lack of Transportation (Non-Medical): No  Physical Activity: Inactive (03/27/2022)   Exercise Vital Sign    Days of Exercise per Week: 0 days    Minutes of Exercise per Session: 0 min  Stress: Stress Concern Present  (03/27/2022)   [place]    Feeling of Stress : To some extent  Social Connections: Moderately Isolated (03/27/2022)   Social Connection and Isolation Panel [NHANES]    Frequency of Communication with Friends and Family: More than three times a week    Frequency of Social Gatherings with Friends and Family: Three times a week    Attends Religious Services: Never    Active Member of Clubs or Organizations: No    Attends Archivist Meetings: Never    Marital Status: Married  Human resources officer Violence: Not At Risk (03/27/2022)   Humiliation, Afraid, Rape, and Kick questionnaire    Fear of Current or Ex-Partner: No    Emotionally Abused: No    Physically Abused: No    Sexually Abused: No      Review of Systems  Musculoskeletal:  Positive for back pain.  All other systems reviewed and are negative.      Objective:   Physical Exam Vitals reviewed.  Constitutional:      General: He is not in acute distress.    Appearance: He is not ill-appearing.  HENT:     Mouth/Throat:     Mouth: Mucous membranes are moist.  Cardiovascular:     Rate and Rhythm: Normal rate and regular rhythm.     Heart sounds: Normal heart sounds. No murmur heard.    No friction rub. No gallop.  Pulmonary:     Effort: Pulmonary effort is normal. No respiratory distress.     Breath sounds: Normal breath sounds. No stridor. No wheezing, rhonchi or rales.  Musculoskeletal:       Back:  Neurological:     General: No focal deficit present.     Mental Status: He is alert and oriented to person, place, and time.     Cranial Nerves: No cranial nerve deficit.     Sensory: No sensory deficit.     Deep Tendon Reflexes: Reflexes normal.           Assessment & Plan:  Closed fracture of second lumbar vertebra, unspecified fracture morphology, initial encounter ([place]) - Plan: Ambulatory referral to Orthopedic Surgery  Spondylosis of  lumbar spine - Plan: Ambulatory referral to Orthopedic Surgery I will refer the patient back to Dr. Nelva Bush for possible epidural steroid injection.  Meanwhile I have recommended increasing his fentanyl to 75 mcg daily as the patient refuses to use the oxycodone as needed.  I explained to the patient that my goal is not to remove all of his pain as that would be impossible.  However I want to help alleviate as much of his pain as possible so that he can be more active  around his home and enjoy his life more as he battles stage IV lung cancer.  I truly want to improve his quality of life

## 2022-04-22 ENCOUNTER — Ambulatory Visit: Payer: PPO | Admitting: Oncology

## 2022-04-22 ENCOUNTER — Other Ambulatory Visit: Payer: PPO

## 2022-04-22 ENCOUNTER — Ambulatory Visit: Payer: PPO

## 2022-04-22 ENCOUNTER — Ambulatory Visit: Payer: PPO | Admitting: Hospice and Palliative Medicine

## 2022-04-23 ENCOUNTER — Inpatient Hospital Stay: Payer: PPO | Admitting: Occupational Therapy

## 2022-04-23 DIAGNOSIS — C7951 Secondary malignant neoplasm of bone: Secondary | ICD-10-CM

## 2022-04-23 NOTE — Therapy (Signed)
[place] at [facility] [address] [place], [place], [address] Phone: [phone]   Fax:  [phone]  Occupational Therapy Screen:  Patient Details  Name: Micheal Donovan MRN: [id number] Date of Birth: June 04, 1950 No data recorded  Encounter Date: 04/23/2022   OT End of Session - 04/23/22 1458     Visit Number 0             Past Medical History:  Diagnosis Date   Allergy    BPH (benign prostatic hypertrophy)    Cancer ([place])    Melanoma on Neck    2008   Carotid artery occlusion    CHF (congestive heart failure) ([place])    Diabetes mellitus    type 2   ED (erectile dysfunction)    GERD (gastroesophageal reflux disease)    Hemorrhagic stroke ([place]) 06/2021   Hyperlipidemia    Hypertension    Lung cancer ([place])    Neoplasm related pain    Retinopathy due to secondary DM [facility])     Past Surgical History:  Procedure Laterality Date   BRONCHIAL NEEDLE ASPIRATION BIOPSY  06/17/2021   Procedure: BRONCHIAL NEEDLE ASPIRATION BIOPSIES;  Surgeon: Candee Furbish, MD;  Location: [place];  Service: Pulmonary;;   IR IMAGING GUIDED PORT INSERTION  08/05/2021   MELANOMA EXCISION  2008   Left side of neck   RADIOLOGY WITH ANESTHESIA N/A 04/05/2020   Procedure: MRI SPINE WITOUT CONTRAST;  Surgeon: Radiologist, Medication, MD;  Location: [place];  Service: Radiology;  Laterality: N/A;   RADIOLOGY WITH ANESTHESIA N/A 08/22/2021   Procedure: MRI BRAIN WITH AND WITHOUT CONTRAST  WITH ANESTHESIA;  Surgeon: Radiologist, Medication, MD;  Location: [place];  Service: Radiology;  Laterality: N/A;   RADIOLOGY WITH ANESTHESIA N/A 12/17/2021   Procedure: MRI BRAIN WITH AND WITHOUT CONTRAST WITH ANESTHESIA; MRI LUMBER WITH AND WITHOUT CONTRAST;  Surgeon: Radiologist, Medication, MD;  Location: [place];  Service: Radiology;  Laterality: N/A;   RADIOLOGY WITH ANESTHESIA N/A 04/08/2022   Procedure: MRI LUMBER SPINE WITH AND WITHOUT CONTRAST;  Surgeon:  Radiologist, Medication, MD;  Location: [place];  Service: Radiology;  Laterality: N/A;   TONSILLECTOMY     VIDEO BRONCHOSCOPY WITH ENDOBRONCHIAL ULTRASOUND N/A 06/17/2021   Procedure: VIDEO BRONCHOSCOPY WITH ENDOBRONCHIAL ULTRASOUND;  Surgeon: Candee Furbish, MD;  Location: [facility] ENDOSCOPY;  Service: Pulmonary;  Laterality: N/A;    There were no vitals filed for this visit.   Subjective Assessment - 04/23/22 1457     Subjective  My appt with DR Nelva Bush in about 2 wks- in the past it helped for few wks to few months- pain at the moment sitting here about 3/10 - worse when moving around , bending , twisting or doing things    Currently in Pain? Yes    Pain Score 3     Pain Location Back    Pain Orientation Lower    Pain Descriptors / Indicators Aching                  [name]  NP-04/14/22 visit  History of Present Illness: Micheal Donovan is a 72 y.o. male with multiple medical problems including non-small cell lung cancer, history of subacute intraparenchymal hemorrhage, PE.  Patient is status post XRT.  He is on systemic chemo.  He is referred to palliative care to address goals and manage ongoing symptoms.    Observations/Objective: I spoke with patient and wife by phone.  Patient saw Dr. Tasia Catchings yesterday.   MRI lumbar spine shows stable remote compression fracture of L2 with large L5 Schmorl's node.  He has degenerative lumbar spondylosis and multilevel degenerative disc disease.  He has enlarging right-sided synovial cyst at L4-L5 with mass effect.   I spoke with neurosurgeon - Dr. Lacinda Axon but patient is a poor surgical candidate.  Recommendation was for trial of epidural steroid injection.   Patient has previously been seen by Ortho - Dr. Nelva Bush who has performed steroid injections as of last year.  Will refer back to Dr. Nelva Bush per patient's request.   Patient has appointment with DME supplier for LSO brace.   He continues to utilize fentanyl/Percocet with some improvement in  pain.   Assessment and Plan: Neoplasm related pain -referral to Ortho.  Recommend LSO brace. Continue fentanyl/Percocet     OT SCREEN 04/23/22: Patient arrive ambulating with wife slow but steady.  Back pain 3/10 when sitting increases with walking or doing activity.  Patient report he has some rolling walker that he use sometimes. Patient scheduled with Dr. Herma Mering for a shot in 2 weeks. Patient had 1 fall in the last 6 months.  2 weeks ago.  At the front steps of his house.  4 steps with a rail to enter the front and 1 step at the side of the house-garage.  Using mostly that 1 now. Patient has a walk-in shower with a chair and hand-held shower but not using chair. Toilet hide okay per patient. 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 Likes to fish. Discussed with patient back protection principles.-Even after discharge if pain is better needs to be compliant with back protection to let the shot work longer. -No twisting. -Keep objects close to body. -Pain to the knees when picking something up. -Keep objects between height of shoulder and knees. 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 -Patient to use adaptive equipment for dressing and bathing.  Can get a hip kit or a knee kit on [employer] that will include reacher and sock aid for dressing.  As well as shoehorn.  And long-handle sponge for bathing. -Discussed with patient also if pain increases and linger for 2 hours or pain increases  the date - look 12-24 hrs  prior what he did different and modify those activities.  Can follow up as needed -Handout will be emailed for patient.                          Visit Diagnosis: Metastasis to bone [facility])    Problem List Patient Active Problem List   Diagnosis Date Noted   Hypotension 04/05/2022   Elevated serum creatinine 03/18/2022   Chemotherapy induced neutropenia  (HCC) 02/13/2022   Thrombocytosis 02/13/2022   Swelling of lower extremity 02/13/2022   Rhinovirus infection 11/20/2021   Hypophosphatemia 11/20/2021   Symptomatic anemia    Enteritis    Neutropenic fever ([place]) 11/18/2021   Essential hypertension 11/18/2021   Dyslipidemia 11/18/2021   Lower abdominal pain 11/18/2021   Frequent urination 11/11/2021   B12 deficiency anemia 09/27/2021   Neoplasm related pain 08/16/2021   Folate deficiency anemia 08/16/2021   Metastasis to bone ([place]) 08/06/2021   Goals of care, counseling/discussion 07/23/2021   Pulmonary embolus ([place]) 07/23/2021   Encounter for antineoplastic chemotherapy 07/17/2021   Primary non-small  cell carcinoma of upper lobe of left lung ([place]) 07/16/2021   Hemoptysis 07/16/2021   Diabetes mellitus ([place]) 07/02/2021   Pressure injury of skin 06/21/2021   Headache 06/13/2021   Hyperlipidemia 06/12/2021   CAP (community acquired pneumonia) 06/12/2021   Cerebellar bleed ([place])    Intraparenchymal hemorrhage of brain ([place]) 06/11/2021   Type 2 diabetes mellitus with hyperglycemia ([place]) 06/11/2021   Pseudohyponatremia 06/11/2021   Dehydration 06/11/2021   Leukocytosis 06/11/2021   Hypercalcemia 06/11/2021   Lumbar radiculopathy 03/15/2021   Lumbar pain 01/10/2020   Carotid stenosis, bilateral 11/07/2011   Type 2 diabetes mellitus without complications ([place]) 15/95/3967   HYPERCHOLESTEROLEMIA 02/07/2010   Benign hypertension 02/07/2010   CAROTID BRUIT, LEFT 02/07/2010   CHEST PAIN 02/07/2010    Rosalyn Gess, OTR/L,CLT 04/23/2022, 2:59 PM  [place] at [facility] [address] [place], [place], [address] Phone: [phone]   Fax:  [phone]  Name: Micheal Donovan MRN: [id number] Date of Birth: 02-Sep-1950

## 2022-04-24 ENCOUNTER — Ambulatory Visit: Payer: PPO

## 2022-04-27 ENCOUNTER — Encounter: Payer: Self-pay | Admitting: Family Medicine

## 2022-04-28 ENCOUNTER — Other Ambulatory Visit (HOSPITAL_COMMUNITY): Payer: PPO

## 2022-04-28 ENCOUNTER — Ambulatory Visit
Admission: RE | Admit: 2022-04-28 | Discharge: 2022-04-28 | Disposition: A | Discharge status: Home / Self Care | Payer: PPO | Source: Ambulatory Visit | Attending: Oncology | Admitting: Oncology

## 2022-04-28 DIAGNOSIS — I700 Atherosclerosis of aorta: Secondary | ICD-10-CM | POA: Diagnosis not present

## 2022-04-28 DIAGNOSIS — C3412 Malignant neoplasm of upper lobe, left bronchus or lung: Secondary | ICD-10-CM | POA: Insufficient documentation

## 2022-04-28 DIAGNOSIS — J90 Pleural effusion, not elsewhere classified: Secondary | ICD-10-CM | POA: Diagnosis not present

## 2022-04-28 DIAGNOSIS — C7951 Secondary malignant neoplasm of bone: Secondary | ICD-10-CM | POA: Diagnosis not present

## 2022-04-29 ENCOUNTER — Other Ambulatory Visit: Payer: Self-pay

## 2022-04-29 MED ORDER — GABAPENTIN 300 MG PO CAPS: 300.0000 mg | ORAL_CAPSULE | Freq: Three times a day (TID) | ORAL | 1 refills | Status: DC

## 2022-05-02 ENCOUNTER — Ambulatory Visit: Payer: PPO | Admitting: Oncology

## 2022-05-02 ENCOUNTER — Other Ambulatory Visit: Payer: PPO

## 2022-05-02 ENCOUNTER — Ambulatory Visit: Payer: PPO

## 2022-05-05 ENCOUNTER — Encounter: Payer: Self-pay | Admitting: Oncology

## 2022-05-05 ENCOUNTER — Inpatient Hospital Stay: Payer: PPO

## 2022-05-05 ENCOUNTER — Inpatient Hospital Stay (HOSPITAL_BASED_OUTPATIENT_CLINIC_OR_DEPARTMENT_OTHER): Payer: PPO | Admitting: Oncology

## 2022-05-05 ENCOUNTER — Inpatient Hospital Stay: Payer: PPO | Attending: Radiation Oncology

## 2022-05-05 VITALS — BP 92/58 | HR 90 | Temp 97.2°F | Resp 18 | Wt 149.6 lb

## 2022-05-05 DIAGNOSIS — M7138 Other bursal cyst, other site: Secondary | ICD-10-CM | POA: Diagnosis not present

## 2022-05-05 DIAGNOSIS — E861 Hypovolemia: Secondary | ICD-10-CM | POA: Diagnosis not present

## 2022-05-05 DIAGNOSIS — E785 Hyperlipidemia, unspecified: Secondary | ICD-10-CM | POA: Diagnosis not present

## 2022-05-05 DIAGNOSIS — G893 Neoplasm related pain (acute) (chronic): Secondary | ICD-10-CM | POA: Insufficient documentation

## 2022-05-05 DIAGNOSIS — M48061 Spinal stenosis, lumbar region without neurogenic claudication: Secondary | ICD-10-CM | POA: Diagnosis not present

## 2022-05-05 DIAGNOSIS — I619 Nontraumatic intracerebral hemorrhage, unspecified: Secondary | ICD-10-CM | POA: Diagnosis not present

## 2022-05-05 DIAGNOSIS — I2699 Other pulmonary embolism without acute cor pulmonale: Secondary | ICD-10-CM

## 2022-05-05 DIAGNOSIS — I959 Hypotension, unspecified: Secondary | ICD-10-CM | POA: Diagnosis not present

## 2022-05-05 DIAGNOSIS — Z79899 Other long term (current) drug therapy: Secondary | ICD-10-CM | POA: Insufficient documentation

## 2022-05-05 DIAGNOSIS — Z794 Long term (current) use of insulin: Secondary | ICD-10-CM | POA: Diagnosis not present

## 2022-05-05 DIAGNOSIS — Z5112 Encounter for antineoplastic immunotherapy: Secondary | ICD-10-CM | POA: Insufficient documentation

## 2022-05-05 DIAGNOSIS — I2510 Atherosclerotic heart disease of native coronary artery without angina pectoris: Secondary | ICD-10-CM | POA: Insufficient documentation

## 2022-05-05 DIAGNOSIS — I700 Atherosclerosis of aorta: Secondary | ICD-10-CM | POA: Insufficient documentation

## 2022-05-05 DIAGNOSIS — C7951 Secondary malignant neoplasm of bone: Secondary | ICD-10-CM | POA: Diagnosis not present

## 2022-05-05 DIAGNOSIS — M47816 Spondylosis without myelopathy or radiculopathy, lumbar region: Secondary | ICD-10-CM | POA: Insufficient documentation

## 2022-05-05 DIAGNOSIS — G4700 Insomnia, unspecified: Secondary | ICD-10-CM | POA: Insufficient documentation

## 2022-05-05 DIAGNOSIS — C3412 Malignant neoplasm of upper lobe, left bronchus or lung: Secondary | ICD-10-CM | POA: Insufficient documentation

## 2022-05-05 DIAGNOSIS — D520 Dietary folate deficiency anemia: Secondary | ICD-10-CM

## 2022-05-05 DIAGNOSIS — I509 Heart failure, unspecified: Secondary | ICD-10-CM | POA: Insufficient documentation

## 2022-05-05 DIAGNOSIS — I110 Hypertensive heart disease with heart failure: Secondary | ICD-10-CM | POA: Diagnosis not present

## 2022-05-05 DIAGNOSIS — C7931 Secondary malignant neoplasm of brain: Secondary | ICD-10-CM | POA: Diagnosis not present

## 2022-05-05 DIAGNOSIS — Z87891 Personal history of nicotine dependence: Secondary | ICD-10-CM | POA: Diagnosis not present

## 2022-05-05 DIAGNOSIS — K219 Gastro-esophageal reflux disease without esophagitis: Secondary | ICD-10-CM | POA: Diagnosis not present

## 2022-05-05 DIAGNOSIS — E11319 Type 2 diabetes mellitus with unspecified diabetic retinopathy without macular edema: Secondary | ICD-10-CM | POA: Diagnosis not present

## 2022-05-05 DIAGNOSIS — I9589 Other hypotension: Secondary | ICD-10-CM

## 2022-05-05 DIAGNOSIS — Z7982 Long term (current) use of aspirin: Secondary | ICD-10-CM | POA: Insufficient documentation

## 2022-05-05 DIAGNOSIS — N401 Enlarged prostate with lower urinary tract symptoms: Secondary | ICD-10-CM | POA: Diagnosis not present

## 2022-05-05 LAB — COMPREHENSIVE METABOLIC PANEL
ALT: 10 U/L (ref 0–44)
AST: 20 U/L (ref 15–41)
Albumin: 3.3 g/dL — ABNORMAL LOW (ref 3.5–5.0)
Alkaline Phosphatase: 85 U/L (ref 38–126)
Anion gap: 9 (ref 5–15)
BUN: 26 mg/dL — ABNORMAL HIGH (ref 8–23)
CO2: 26 mmol/L (ref 22–32)
Calcium: 8.6 mg/dL — ABNORMAL LOW (ref 8.9–10.3)
Chloride: 98 mmol/L (ref 98–111)
Creatinine, Ser: 1.34 mg/dL — ABNORMAL HIGH (ref 0.61–1.24)
GFR, Estimated: 57 mL/min — ABNORMAL LOW (ref >60)
Glucose, Bld: 109 mg/dL — ABNORMAL HIGH (ref 70–99)
Potassium: 3.6 mmol/L (ref 3.5–5.1)
Sodium: 133 mmol/L — ABNORMAL LOW (ref 135–145)
Total Bilirubin: 0.3 mg/dL (ref 0.3–1.2)
Total Protein: 6.5 g/dL (ref 6.5–8.1)

## 2022-05-05 LAB — CBC WITH DIFFERENTIAL/PLATELET
Abs Immature Granulocytes: 0.02 10*3/uL (ref 0.00–0.07)
Basophils Absolute: 0 10*3/uL (ref 0.0–0.1)
Basophils Relative: 1 %
Eosinophils Absolute: 0.3 10*3/uL (ref 0.0–0.5)
Eosinophils Relative: 4 %
HCT: 30.5 % — ABNORMAL LOW (ref 39.0–52.0)
Hemoglobin: 9.7 g/dL — ABNORMAL LOW (ref 13.0–17.0)
Immature Granulocytes: 0 %
Lymphocytes Relative: 13 %
Lymphs Abs: 0.8 10*3/uL (ref 0.7–4.0)
MCH: 32.7 pg (ref 26.0–34.0)
MCHC: 31.8 g/dL (ref 30.0–36.0)
MCV: 102.7 fL — ABNORMAL HIGH (ref 80.0–100.0)
Monocytes Absolute: 0.4 10*3/uL (ref 0.1–1.0)
Monocytes Relative: 7 %
Neutro Abs: 4.5 10*3/uL (ref 1.7–7.7)
Neutrophils Relative %: 75 %
Platelets: 317 10*3/uL (ref 150–400)
RBC: 2.97 MIL/uL — ABNORMAL LOW (ref 4.22–5.81)
RDW: 13.9 % (ref 11.5–15.5)
WBC: 6 10*3/uL (ref 4.0–10.5)
nRBC: 0 % (ref 0.0–0.2)

## 2022-05-05 MED ORDER — ZOLEDRONIC ACID 4 MG/100ML IV SOLN
4.0000 mg | Freq: Once | INTRAVENOUS | Status: AC
  Administered 2022-05-05: 4 mg via INTRAVENOUS
  Filled 2022-05-05: qty 100

## 2022-05-05 MED ORDER — SODIUM CHLORIDE 0.9 % IV SOLN
200.0000 mg | Freq: Once | INTRAVENOUS | Status: AC
  Administered 2022-05-05: 200 mg via INTRAVENOUS
  Filled 2022-05-05: qty 8

## 2022-05-05 MED ORDER — SODIUM CHLORIDE 0.9 % IV SOLN
Freq: Once | INTRAVENOUS | Status: AC
  Filled 2022-05-05: qty 250

## 2022-05-05 MED ORDER — HEPARIN SOD (PORK) LOCK FLUSH 100 UNIT/ML IV SOLN
500.0000 [IU] | Freq: Once | INTRAVENOUS | Status: DC | PRN
  Filled 2022-05-05: qty 5

## 2022-05-05 NOTE — Progress Notes (Addendum)
Hematology/Oncology Progress note [phone] [phone]       CHIEF COMPLAINTS/REASON FOR VISIT:  Metastatic non-small cell lung cancer  ASSESSMENT & PLAN:   Cancer Staging  Primary non-small cell carcinoma of upper lobe of left lung ([place]) Staging form: Lung, AJCC 8th Edition - Clinical: Stage IV (cT2b, cN1, cM1) - Signed by [name], MD on 08/06/2021   Metastasis to bone (HCC) Recommend Zometa every 4- 6 weeks.-proceed today.  Continue calcium supplementation.  MRI lumbar with and without contrast showed  Stable remote superior endplate fracture of L2 and large Schmorl to noted involving L5. 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  Primary non-small cell carcinoma of upper lobe of left lung ([place]) Stage IV lung adenocarcinoma with brain and bone metastasis.  PET scan images were reviewed with patient.- good partial response.  Hold  maintenance Alimta/Keytruda  -dose reduce Alimta '350mg'$ /m2 Due to decrease PS, discontinue Alimta.  Labs are reviewed and discussed with patient. Proceed with Keytruda CT chest abdomen pelvis wo contrast- stable disease  Encounter for antineoplastic immunotherapy Immunotherapy plan as listed above  Folate deficiency anemia Continue folic acid supplementation.    Hypotension normal cortisol and ACTH  Probably due to poor oral intake.  Recommend to hold off Lasix and losartan.   Intraparenchymal hemorrhage of brain (HCC) Hold Eliquis.  Follow-up with Dr. Mickeal Skinner Repeat MRI brain  Neoplasm related pain Back pain, MRI lumbar showed compression fracture Continue PRN percocet, recently added fentanyl patch recommend Flexeril  MRI lumbar showed DJD, L4-L5 synovial cyst, no new bone mets.  He may benefit from epidural steroid injections.    Pulmonary embolus (HCC) MRI brain shows persistent microbleeds.   Off Eliquis 2.'5mg'$  BID.      Follow up 3  weeks  All questions were answered. The patient knows to call the clinic with any problems, questions or concerns.  [name], MD, [name] [facility] Hematology Oncology 05/05/2022      HISTORY OF PRESENTING ILLNESS:   Micheal Donovan is a  72 y.o.  male presents for follow up of Non-small cell lung cancer.  Oncology History Overview Note  Diagnosis: Stage IIB T2b N1 M0 adenocarcinoma of the LUL, poorly differentiated     Primary non-small cell carcinoma of upper lobe of left lung ([place])  06/13/2021 Initial Diagnosis   Primary non-small cell carcinoma of upper lobe of left lung [facility]) -06/20/2021 - 06/27/2021, patient presented to [facility] due to progressive headache/dizziness/gait changes. CT head showed acute to subacute intraparenchymal hemorrhage involving the left cerebellum.  Surrounding low-density vasogenic edema.  Patient was [facility]. This was further evaluated by CT angiogram of the neck which showed bulky calcified plaques/stenosis of carotid artery, Followed by MRI brain. 06/12/2021, MRI of the brain showed no significant interval change in size of the left cerebellar intraparenchymal hematoma with unchanged regional mass effect and partial effacement of fourth ventricle but no upstream hydrocephalus. There is no discernible enhancement to suggest underlying mass lesion, though acute blood products could mask enhancement.   06/12/2021 a chest x-ray showed a 4.4 cm left middle lobe. 06/13/2021, CT chest with contrast showed a 4.3 x 3.6 x 3.3 cm lobular spiculated mass in the posterior left upper lobe with T3 to the lateral pleura and major fissure.  Metastatic left hilar lymphadenopathy.  Peripheral micronodularity posterior right costophrenic sulcus.  Aortic atherosclerosis. 06/14/2021, CT abdomen pelvis showed right lower lobe pulmonary artery embolus.  No evidence of right heart strain.  No acute intra-abdominal or pelvic pathology.  Aortic  atherosclerosis.  06/13/2021, patient underwent bronchoscopy with EBUS by Dr. Tamala Julian.  Biopsy from the fine-needle aspiration of station 11 mL showed malignant cells, consistent with poorly differentiated non-small cell carcinoma, consistent with adenocarcinoma.  Malignant cells are TTF-1 positive and negative for p40.  Negative for neuroendocrine markers.  Blood test and tissue sample were tested for Gardant 360  -PD-L1 TPS 97%, no actionable mutation on the blood testing. 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   07/17/2021 Cancer Staging   Staging form: Lung, AJCC 8th Edition - Clinical: Stage IV (cT2b, cN1, cM1) - Signed by [name], MD on 08/06/2021   08/06/2021 Imaging   I saw patient's PET scan after his visit with me on 08/06/21. PET scan was ordered by his previous oncologist group and did not come to my in basket.. Patient received cycle 1 carboplatin Taxol today.  He has started on radiation.  5/26/3 PET scan showed 3.8 cm hypermetabolic left upper lobe mass with hypermetabolic left hilar and infrahilar adenopathy.  There is approximately 8 scattered metastatic lesions in the skeleton. Mixed density photopenic lesion in the left cerebellum. characterized as hemorrhage on recent prior imaging workups.    08/16/2021 -  Chemotherapy   Patient is on Treatment Plan : LUNG Carboplatin (5) + Pemetrexed + Pembrolizumab (200) D1 q21d Induction x 4 cycles / Maintenance Pemetrexed + Pembrolizumab (200) D1 q21d      08/22/2021 Imaging   MRI brain w wo contrast  Decrease in size of left cerebellar hematoma with resolution of edema. No evidence of underlying lesion. Smaller, more recent hemorrhage in the left cerebellar vermis with minimal edema. There is minimal enhancement without definite evidence of underlying lesion.  New punctate focus of chronic blood products and enhancement in the left frontal lobe. Additional new foci of chronic blood products in the posterior right putamen,  right parietal subcortical white matter, and posterior right cerebellum. Unclear at this time if these represent foci of bland hemorrhage or early metastases.   Increase in size of right parietal osseous metastasis with minor extraosseous extension.     Imaging   PET scan showed 1. Interval response to therapy as evidenced by a small residual left upper lobe nodule with decreased hypermetabolism, no residual hypermetabolic adenopathy and decreased hypermetabolism associatedwith osseous metastases. 2. 6 mm posterior left upper lobe nodule, likely stable. Recommend attention on follow-up. 3. Aortic atherosclerosis (ICD10-I70.0). Coronary artery calcification.   12/17/2021 Imaging   MRI lumbar spine w wo contrast  1. No evidence of regional metastatic disease. 2. Late subacute superior endplate fracture at L2 and large superior endplate Schmorl's node at L5, unchanged since the PET scan of 10/29/2021. 3. L3-4: Shallow disc protrusion. Facet and ligamentous hypertrophy. Stenosis of both lateral recesses. Findings slightly worsened since 2022. 4. L4-5: Shallow disc protrusion. Facet and ligamentous hypertrophy. Small synovial cyst arising from the facet joint on the right. Stenosis of the lateral recesses right worse than left. Foraminal narrowing right worse than left. Findings have worsened since 2022.  5. L5-S1: Endplate osteophytes and shallow protrusion of the disc. Facet and ligamentous hypertrophy. Stenosis of the subarticular lateral recesses and neural foramina, right worse than left. Similar appearance to the study of 2022. 6. Continued evidence of enteritis of at least 1 loop of small bowel. Distended bladder present   12/18/2021 Imaging   Brian MRI w wo  1. Interval decrease in size of  the left cerebellar and vermian hematomas. No definite evidence of underlying metastatic lesion. 2. There is are two possible contrast enhancing lesions in the posterior left frontal lobe and left  occipital lobe, which do not have intrinsic T1 signal abnormality, but have a somewhat linear appearance and may be vascular in nature. Recommend attention on follow up. 3. Multifocal sites of susceptibility artifact are redemonstrated with interval development of a few new foci, as described above. All of these sites demonstrate intrinsic T1 hyperintense signal abnormality and susceptibility artifact and are compatible with sites of microhemorrhages. Recommend continued attention on follow up. 4. Interval decrease in size of right parietal calvarial metastatic lesion. No new contrast enhancing lesions visualized. 5. Diffusely heterogeneous marrow signal throughout the cervical spine, which is nonspecific but can be seen in the setting of anemia, smoking, obesity, or a marrow replacement process.     12/18/2021 Imaging   MRI lumbar spine w wo contrast  1. No evidence of regional metastatic disease. 2. Late subacute superior endplate fracture at L2 and large superior endplate Schmorl's node at L5, unchanged since the PET scan of 10/29/2021 3. L3-4: Shallow disc protrusion. Facet and ligamentous hypertrophy.Stenosis of both lateral recesses. Findings slightly worsened since 2022. 4. L4-5: Shallow disc protrusion. Facet and ligamentous hypertrophy. Small synovial cyst arising from the facet joint on the right. Stenosis of the lateral recesses right worse than left. Foraminal narrowing right worse than left. Findings have worsened since 2022. 5. L5-S1: Endplate osteophytes and shallow protrusion of the disc. Facet and ligamentous hypertrophy. Stenosis of the subarticular lateral recesses and neural foramina, right worse than left. Similar appearance to the study of 2022. 6. Continued evidence of enteritis of at least 1 loop of small bowel. Distended bladder present.       01/28/2022 Imaging   CT chest abdomen pelvis w contrast 1. Treated left upper lobe mass appears grossly stable to the prior  examination when measured in a similar fashion on the prior study. No definite signs of extra skeletal metastatic disease noted in the chest, abdomen or pelvis. 2. Widespread skeletal metastases redemonstrated, as above. 3. Hepatic steatosis. 4. Aortic atherosclerosis, in addition to left main and three-vessel coronary artery disease. Assessment for potential risk factor modification, dietary therapy or pharmacologic therapy may be warranted, if clinically indicated. 5. Additional incidental findings,    04/08/2022 Imaging   MRI lumbar spine w wo contrast  1. Stable remote superior endplate fracture of L2 and large Schmorl to noted involving L5. No worrisome bone lesions to suggest metastatic disease. 2. Degenerative lumbar spondylosis with multilevel disc disease and facet disease which appears relatively stable as detailed above. 3. Enlarging right-sided synovial cyst at L4-5 with progressive mass effect on the right side of the thecal sac. This could potentially be symptomatic   04/28/2022 Imaging   CT chest abdomen pelvis wo contrast  1. Similar versus slightly decreased size of treated left upper lobe primary bronchogenic carcinoma. 2. Similar osseous metastasis. 3. No evidence of extraosseous metastatic disease. 4. New small right pleural effusion. 5. Coronary artery atherosclerosis. Aortic Atherosclerosis   # Patient has a history of melanoma on his neck, treated in 2009. # History of hemorrhagic infarct left cerebellum  INTERVAL HISTORY Micheal Donovan is a 72 y.o. male who has above history reviewed by me today presents for follow up visit for management of  Stage IV lung adenocarcianoma # Pulmonary embolism, SOB is stable, not worse. Off Eliquis due to MRI findings.  + Insomnia  and nocturia. On Ativan QHS, not helping - he takes frequent naps during the day + Lower extremity swelling, started on Lasix, swelling has improved in calf, still some swelling in his feet.  + feels better,   lost weight since last visit.  + back pain, on fentanyl patch and percocet, he sees orthopedic surgeon for epidural injections.  Accompanied by wife.    Review of Systems  Constitutional:  Positive for fatigue. Negative for appetite change, chills, fever and unexpected weight change.  HENT:   Negative for hearing loss and voice change.   Eyes:  Negative for eye problems and icterus.  Respiratory:  Positive for shortness of breath. Negative for chest tightness and cough.   Cardiovascular:  Positive for leg swelling. Negative for chest pain.  Gastrointestinal:  Negative for abdominal distention, abdominal pain and blood in stool.  Endocrine: Negative for hot flashes.  Genitourinary:  Negative for difficulty urinating, dysuria and frequency.   Musculoskeletal:  Positive for back pain. Negative for arthralgias.  Skin:  Negative for itching and rash.  Neurological:  Negative for extremity weakness, headaches, light-headedness and numbness.  Hematological:  Negative for adenopathy. Does not bruise/bleed easily.  Psychiatric/Behavioral:  Positive for sleep disturbance. Negative for confusion.     MEDICAL HISTORY:  Past Medical History:  Diagnosis Date   Allergy    BPH (benign prostatic hypertrophy)    Cancer (HCC)    Melanoma on Neck    2008   Carotid artery occlusion    CHF (congestive heart failure) (HCC)    Diabetes mellitus    type 2   ED (erectile dysfunction)    GERD (gastroesophageal reflux disease)    Hemorrhagic stroke ([place]) 06/2021   Hyperlipidemia    Hypertension    Lung cancer ([place])    Neoplasm related pain    Retinopathy due to secondary DM ([place])     SURGICAL HISTORY: Past Surgical History:  Procedure Laterality Date   BRONCHIAL NEEDLE ASPIRATION BIOPSY  06/17/2021   Procedure: BRONCHIAL NEEDLE ASPIRATION BIOPSIES;  Surgeon: Candee Furbish, MD;  Location: [place];  Service: Pulmonary;;   IR IMAGING GUIDED PORT INSERTION  08/05/2021   MELANOMA EXCISION  2008    Left side of neck   RADIOLOGY WITH ANESTHESIA N/A 04/05/2020   Procedure: MRI SPINE WITOUT CONTRAST;  Surgeon: Radiologist, Medication, MD;  Location: [place];  Service: Radiology;  Laterality: N/A;   RADIOLOGY WITH ANESTHESIA N/A 08/22/2021   Procedure: MRI BRAIN WITH AND WITHOUT CONTRAST  WITH ANESTHESIA;  Surgeon: Radiologist, Medication, MD;  Location: [place];  Service: Radiology;  Laterality: N/A;   RADIOLOGY WITH ANESTHESIA N/A 12/17/2021   Procedure: MRI BRAIN WITH AND WITHOUT CONTRAST WITH ANESTHESIA; MRI LUMBER WITH AND WITHOUT CONTRAST;  Surgeon: Radiologist, Medication, MD;  Location: [place];  Service: Radiology;  Laterality: N/A;   RADIOLOGY WITH ANESTHESIA N/A 04/08/2022   Procedure: MRI LUMBER SPINE WITH AND WITHOUT CONTRAST;  Surgeon: Radiologist, Medication, MD;  Location: [place];  Service: Radiology;  Laterality: N/A;   TONSILLECTOMY     VIDEO BRONCHOSCOPY WITH ENDOBRONCHIAL ULTRASOUND N/A 06/17/2021   Procedure: VIDEO BRONCHOSCOPY WITH ENDOBRONCHIAL ULTRASOUND;  Surgeon: Candee Furbish, MD;  Location: [facility];  Service: Pulmonary;  Laterality: N/A;    SOCIAL HISTORY: Social History   Socioeconomic History   Marital status: Married    Spouse name: Not on file   Number of children: Not on file   Years of education: Not on file   Highest education level:  Not on file  Occupational History   Not on file  Tobacco Use   Smoking status: Former    Types: Cigarettes    Quit date: 07/21/1990    Years since quitting: 31.8   Smokeless tobacco: Never  Vaping Use   Vaping Use: Never used  Substance and Sexual Activity   Alcohol use: No   Drug use: No   Sexual activity: Not Currently  Other Topics Concern   Not on file  Social History Narrative   Not on file   Social Determinants of Health   Financial Resource Strain: Low Risk  (04/17/2022)   Overall Financial Resource Strain (CARDIA)    Difficulty of Paying Living Expenses: Not hard at all  Food Insecurity: No Food  Insecurity (04/17/2022)   Hunger Vital Sign    Worried About Running Out of Food in the Last Year: Never true    Ran Out of Food in the Last Year: Never true  Transportation Needs: No Transportation Needs (03/27/2022)   PRAPARE - Hydrologist (Medical): No    Lack of Transportation (Non-Medical): No  Physical Activity: Inactive (03/27/2022)   Exercise Vital Sign    Days of Exercise per Week: 0 days    Minutes of Exercise per Session: 0 min  Stress: Stress Concern Present (03/27/2022)   [place]    Feeling of Stress : To some extent  Social Connections: Moderately Isolated (03/27/2022)   Social Connection and Isolation Panel [NHANES]    Frequency of Communication with Friends and Family: More than three times a week    Frequency of Social Gatherings with Friends and Family: Three times a week    Attends Religious Services: Never    Active Member of Clubs or Organizations: No    Attends Archivist Meetings: Never    Marital Status: Married  Human resources officer Violence: Not At Risk (03/27/2022)   Humiliation, Afraid, Rape, and Kick questionnaire    Fear of Current or Ex-Partner: No    Emotionally Abused: No    Physically Abused: No    Sexually Abused: No    FAMILY HISTORY: Family History  Problem Relation Age of Onset   COPD Mother    Heart disease Father    Heart disease Brother        MI at age 17   Stroke Neg Hx     ALLERGIES:  is allergic to niaspan [niacin].  MEDICATIONS:  Current Outpatient Medications  Medication Sig Dispense Refill   aspirin EC 81 MG tablet Take 1 tablet (81 mg total) by mouth daily. Swallow whole. 30 tablet 12   atorvastatin (LIPITOR) 40 MG tablet Take 1 tablet (40 mg total) by mouth daily. 60 tablet 2   Ca Phosphate-Cholecalciferol (CALCIUM WITH D3 PO) Take 1 capsule by mouth daily.     fentaNYL (DURAGESIC) 75 MCG/HR Place 1 patch onto the skin  every 3 (three) days. 10 patch 0   folic acid (FOLVITE) Q000111Q MCG tablet Take 1 tablet (800 mcg total) by mouth daily. 60 tablet 1   furosemide (LASIX) 20 MG tablet Take 1 tablet (20 mg total) by mouth daily as needed (leg swelling or sob). 30 tablet 3   gabapentin (NEURONTIN) 300 MG capsule Take 1 capsule (300 mg total) by mouth 3 (three) times daily. 90 capsule 1   insulin glargine (LANTUS) 100 UNIT/ML injection Inject 30 Units into the skin daily as needed. Only takes Lantus  when sugar is above 150.     neomycin-polymyxin b-dexamethasone (MAXITROL) 3.5-10000-0.1 SUSP 1 drop 3 (three) times daily.     NOVOLOG FLEXPEN 100 UNIT/ML FlexPen Inject 0-10 Units into the skin daily as needed for high blood sugar (above 200). Sliding scale     omeprazole (PRILOSEC) 20 MG capsule Take 20 mg by mouth daily.     oxyCODONE-acetaminophen (PERCOCET) 7.5-325 MG tablet Take 1 tablet by mouth every 4 (four) hours as needed for severe pain. Do not take with cough medication or other pain medication 60 tablet 0   Probiotic Product (PROBIOTIC DAILY PO) Take by mouth. Physician's Choice Probiotic     tamsulosin (FLOMAX) 0.4 MG CAPS capsule TAKE 1 CAPSULE(0.4 MG) BY MOUTH DAILY 90 capsule 0   traZODone (DESYREL) 50 MG tablet Take 1 tablet (50 mg total) by mouth at bedtime as needed for sleep. 30 tablet 3   UNABLE TO FIND PLEASE CHECK FASTING BLOOD GLUCOSE EVERY MORNING 1 each 0   valsartan (DIOVAN) 80 MG tablet Take 1 tablet (80 mg total) by mouth daily. 90 tablet 3   No current facility-administered medications for this visit.   Facility-Administered Medications Ordered in Other Visits  Medication Dose Route Frequency Provider Last Rate Last Admin   heparin lock flush 100 UNIT/ML injection              PHYSICAL EXAMINATION:  Vitals:   05/05/22 1308  BP: (!) 92/58  Pulse: 90  Resp: 18  Temp: (!) 97.2 F (36.2 C)  SpO2: 96%   Filed Weights   05/05/22 1308  Weight: 149 lb 9.6 oz (67.9 kg)    Physical  Exam Constitutional:      General: He is not in acute distress.    Appearance: He is ill-appearing.  HENT:     Head: Normocephalic and atraumatic.  Eyes:     General: No scleral icterus. Cardiovascular:     Rate and Rhythm: Normal rate and regular rhythm.     Heart sounds: Normal heart sounds.  Pulmonary:     Effort: Pulmonary effort is normal. No respiratory distress.     Breath sounds: No wheezing.     Comments: Decreased breath sound bilaterally. Abdominal:     General: Bowel sounds are normal. There is no distension.     Palpations: Abdomen is soft.  Musculoskeletal:        General: No deformity. Normal range of motion.     Cervical back: Normal range of motion and neck supple.     Comments: Bilateral ankle swelling, right >left.   Skin:    General: Skin is warm and dry.     Findings: No erythema or rash.  Neurological:     Mental Status: He is alert and oriented to person, place, and time. Mental status is at baseline.     Cranial Nerves: No cranial nerve deficit.     Coordination: Coordination normal.  Psychiatric:        Mood and Affect: Mood normal.     LABORATORY DATA:  I have reviewed the data as listed    Latest Ref Rng & Units 05/05/2022   12:52 PM 04/14/2022   10:14 AM 04/04/2022   10:07 AM  CBC  WBC 4.0 - 10.5 K/uL 6.0  12.6  13.2   Hemoglobin 13.0 - 17.0 g/dL 9.7  8.7  8.6   Hematocrit 39.0 - 52.0 % 30.5  27.0  25.5   Platelets 150 - 400 K/uL 317  375  355       Latest Ref Rng & Units 05/05/2022   12:52 PM 04/14/2022   10:14 AM 04/04/2022   10:07 AM  CMP  Glucose 70 - 99 mg/dL 109  169  176   BUN 8 - 23 mg/dL '26  18  16   '$ Creatinine 0.61 - 1.24 mg/dL 1.34  1.22  1.40   Sodium 135 - 145 mmol/L 133  132  135   Potassium 3.5 - 5.1 mmol/L 3.6  4.2  3.6   Chloride 98 - 111 mmol/L 98  99  101   CO2 22 - 32 mmol/L '26  22  24   '$ Calcium 8.9 - 10.3 mg/dL 8.6  8.3  8.8   Total Protein 6.5 - 8.1 g/dL 6.5  6.2  6.1   Total Bilirubin 0.3 - 1.2 mg/dL 0.3  0.2  0.3    Alkaline Phos 38 - 126 U/L 85  101  112   AST 15 - 41 U/L '20  25  30   '$ ALT 0 - 44 U/L '10  12  14      '$ Iron/TIBC/Ferritin/ %Sat    Component Value Date/Time   IRON 60 02/03/2022 0958   TIBC 391 02/03/2022 0958   FERRITIN 704 (H) 02/03/2022 0958   IRONPCTSAT 15 (L) 02/03/2022 [id number]      RADIOGRAPHIC STUDIES: I have personally reviewed the radiological images as listed and agreed with the findings in the report. CT CHEST ABDOMEN PELVIS WO CONTRAST  Result Date: 04/28/2022 CLINICAL DATA:  Non-small-cell carcinoma of left upper lobe. Known osseous metastasis. Adenocarcinoma with brain and bone metastasis. * Tracking Code: BO * EXAM: CT CHEST, ABDOMEN AND PELVIS WITHOUT CONTRAST TECHNIQUE: Multidetector CT imaging of the chest, abdomen and pelvis was performed following the standard protocol without IV contrast. RADIATION DOSE REDUCTION: This exam was performed according to the departmental dose-optimization program which includes automated exposure control, adjustment of the mA and/or kV according to patient size and/or use of iterative reconstruction technique. COMPARISON:  01/27/2022 FINDINGS: CT CHEST FINDINGS Cardiovascular: Right Port-A-Cath tip high right atrium. Aortic atherosclerosis. Normal heart size with trace, physiologic pericardial fluid. Three vessel coronary artery calcification. Mediastinum/Nodes: No mediastinal or hilar adenopathy, given limitations of unenhanced CT. Lungs/Pleura: Small right pleural effusion, new. Posterior left upper lobe treated primary measures 1.9 x 3.9 cm in 68/4 versus 2.2 x 3.9 cm on the prior exam when measured in a similar fashion. 1.7 cm craniocaudal on sagittal image 106 today versus 1.8 cm on the prior exam when remeasured in a similar fashion. Left lower lobe calcified granuloma of 3-4 mm. Musculoskeletal: Remote seventh posterolateral left rib fracture.Similar appearance of superior and left-sided T12 sclerotic lesion on 66/5. CT ABDOMEN PELVIS  FINDINGS Hepatobiliary: Normal noncontrast appearance of the liver, gallbladder, biliary tract. Pancreas: Fatty replacement involving the pancreatic head and uncinate process. No duct dilatation or acute inflammation. Spleen: Normal in size, without focal abnormality. Adrenals/Urinary Tract: Normal adrenal glands. Bilateral subcentimeter hypoattenuating renal lesions are likely cysts . In the absence of clinically indicated signs/symptoms require(s) no independent follow-up. No renal calculi or hydronephrosis. No hydroureter or ureteric calculi. No bladder calculi. Stomach/Bowel: Normal stomach, without wall thickening. Normal terminal ileum and appendix. Normal small bowel. Vascular/Lymphatic: Aortic atherosclerosis. No abdominopelvic adenopathy. Reproductive: Normal prostate. Other: No significant free fluid. No free intraperitoneal air. Anasarca. Musculoskeletal: Similar widespread sclerotic osseous metastasis. Example left-sided L5 vertebral body at 2.7 cm on 89/2. mild L2 superior endplate compression deformity is chronic. IMPRESSION: 1.  Similar versus slightly decreased size of treated left upper lobe primary bronchogenic carcinoma. 2. Similar osseous metastasis. 3. No evidence of extraosseous metastatic disease. 4. New small right pleural effusion. 5. Coronary artery atherosclerosis. Aortic Atherosclerosis (ICD10-I70.0). Electronically Signed   By: Abigail Miyamoto M.D.   On: 04/28/2022 15:04   MR Lumbar Spine W Wo Contrast  Result Date: 04/08/2022 CLINICAL DATA:  Persistent severe back pain. History of lung cancer. EXAM: MRI LUMBAR SPINE WITHOUT AND WITH CONTRAST TECHNIQUE: Multiplanar and multiecho pulse sequences of the lumbar spine were obtained without and with intravenous contrast. CONTRAST:  51m GADAVIST GADOBUTROL 1 MMOL/ML IV SOLN COMPARISON:  MRI 12/17/2021 FINDINGS: Segmentation: There are five lumbar type vertebral bodies. The last full intervertebral disc space is labeled L5-S1. This correlates  with the prior study. Alignment: Normal overall alignment. Stable degenerative retrolisthesis of L5. Vertebrae: Remote superior endplate fracture of L2 appears stable. There is also a very large Schmorl's node in the L5 vertebral body which is unchanged. No findings suspicious for osseous metastatic disease. No areas of worrisome contrast enhancement. Conus medullaris and cauda equina: Conus extends to the L1 level. Conus and cauda equina appear normal. Paraspinal and other soft tissues: Medial fatty atrophy and patchy edema in the paraspinal musculature. No significant retroperitoneal findings. Disc levels: L1-2: Stable mild bulging annulus and moderate facet disease but no spinal or foraminal stenosis. L2-3: Shallow right paracentral disc protrusion with minimal impression on the ventral thecal sac. Mild right lateral recess encroachment. No foraminal stenosis. No change since prior. L3-4: Shallow broad-based disc protrusion and moderate facet disease contributing to mild right and moderate left lateral recess stenosis. There is also stable mild left foraminal encroachment. L4-5: Bulging degenerated annulus, shallow left paracentral disc protrusion and advanced facet disease appears stable. There is an enlarging right-sided synovial cyst now measuring 6 mm. There is slightly progressive mass effect on the lateral aspect of the thecal sac which could be symptomatic. This in combination with the above findings contributes to mild spinal and moderate bilateral lateral recess stenosis. No significant foraminal stenosis. L5-S1: Bulging degenerated annulus, osteophytic ridging and facet disease. Mild bilateral lateral recess stenosis but no significant spinal stenosis and no significant change since prior study. IMPRESSION: 1. Stable remote superior endplate fracture of L2 and large Schmorl to noted involving L5. No worrisome bone lesions to suggest metastatic disease. 2. Degenerative lumbar spondylosis with multilevel  disc disease and facet disease which appears relatively stable as detailed above. 3. Enlarging right-sided synovial cyst at L4-5 with progressive mass effect on the right side of the thecal sac. This could potentially be symptomatic. Electronically Signed   By: PMarijo SanesM.D.   On: 04/08/2022 11:16

## 2022-05-05 NOTE — Assessment & Plan Note (Signed)
Immunotherapy plan as listed above

## 2022-05-05 NOTE — Assessment & Plan Note (Signed)
normal cortisol and ACTH  Probably due to poor oral intake.  Recommend to hold off Lasix and losartan.

## 2022-05-05 NOTE — Assessment & Plan Note (Signed)
Hold Eliquis.  Follow-up with Dr. Mickeal Skinner Repeat MRI brain

## 2022-05-05 NOTE — Assessment & Plan Note (Addendum)
Recommend Zometa every 4- 6 weeks.-proceed today.  Continue calcium supplementation.  MRI lumbar with and without contrast showed  Stable remote superior endplate fracture of L2 and large Schmorl to noted involving L5. 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

## 2022-05-05 NOTE — Assessment & Plan Note (Signed)
Back pain, MRI lumbar showed compression fracture Continue PRN percocet, recently added fentanyl patch recommend Flexeril  MRI lumbar showed DJD, L4-L5 synovial cyst, no new bone mets.  He may benefit from epidural steroid injections.

## 2022-05-05 NOTE — Assessment & Plan Note (Signed)
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  PET scan images were reviewed with patient.- good partial response.  Hold  maintenance Alimta/Keytruda  -dose reduce Alimta '350mg'$ /m2 Due to decrease PS, discontinue Alimta.  Labs are reviewed and discussed with patient. Proceed with Keytruda CT chest abdomen pelvis wo contrast- stable disease

## 2022-05-05 NOTE — Assessment & Plan Note (Signed)
Continue folic acid supplementation.

## 2022-05-05 NOTE — Patient Instructions (Signed)
[facility]  Discharge Instructions: Thank you for choosing [facility] to provide your oncology and hematology care.  If you have a lab appointment with the [facility], please go directly to the [facility] and check in at the registration area.  Wear comfortable clothing and clothing appropriate for easy access to any Portacath or PICC line.   We strive to give you quality time with your provider. You may need to reschedule your appointment if you arrive late (15 or more minutes).  Arriving late affects you and other patients whose appointments are after yours.  Also, if you miss three or more appointments without notifying the office, you may be dismissed from the clinic at the provider's discretion.      For prescription refill requests, have your pharmacy contact our office and allow 72 hours for refills to be completed.    Today you received the following chemotherapy and/or immunotherapy agents Zometa & Keytruda      To help prevent nausea and vomiting after your treatment, we encourage you to take your nausea medication as directed.  BELOW ARE SYMPTOMS THAT SHOULD BE REPORTED IMMEDIATELY: *FEVER GREATER THAN 100.4 F (38 C) OR HIGHER *CHILLS OR SWEATING *NAUSEA AND VOMITING THAT IS NOT CONTROLLED WITH YOUR NAUSEA MEDICATION *UNUSUAL SHORTNESS OF BREATH *UNUSUAL BRUISING OR BLEEDING *URINARY PROBLEMS (pain or burning when urinating, or frequent urination) *BOWEL PROBLEMS (unusual diarrhea, constipation, pain near the anus) TENDERNESS IN MOUTH AND THROAT WITH OR WITHOUT PRESENCE OF ULCERS (sore throat, sores in mouth, or a toothache) UNUSUAL RASH, SWELLING OR PAIN  UNUSUAL VAGINAL DISCHARGE OR ITCHING   Items with * indicate a potential emergency and should be followed up as soon as possible or go to the Emergency Department if any problems should occur.  Please show the CHEMOTHERAPY ALERT CARD or IMMUNOTHERAPY ALERT CARD at  check-in to the Emergency Department and triage nurse.  Should you have questions after your visit or need to cancel or reschedule your appointment, please contact [facility]  [phone] and follow the prompts.  Office hours are 8:00 a.m. to 4:30 p.m. Monday - Friday. Please note that voicemails left after 4:00 p.m. may not be returned until the following business day.  We are closed weekends and major holidays. You have access to a nurse at all times for urgent questions. Please call the main number to the clinic [phone] and follow the prompts.  For any non-urgent questions, you may also contact your provider using MyChart. We now offer e-Visits for anyone 47 and older to request care online for non-urgent symptoms. For details visit [email or web address].   Also download the MyChart app! Go to the app store, search "MyChart", open the app, select [place] [facility], and log in with your MyChart username and password.

## 2022-05-05 NOTE — Assessment & Plan Note (Signed)
MRI brain shows persistent microbleeds.   Off Eliquis 2.5mg  BID.

## 2022-05-06 ENCOUNTER — Ambulatory Visit (HOSPITAL_COMMUNITY): Payer: PPO

## 2022-05-07 ENCOUNTER — Ambulatory Visit: Payer: PPO

## 2022-05-08 ENCOUNTER — Telehealth: Payer: Self-pay | Admitting: Pharmacist

## 2022-05-08 DIAGNOSIS — M47816 Spondylosis without myelopathy or radiculopathy, lumbar region: Secondary | ICD-10-CM | POA: Diagnosis not present

## 2022-05-08 NOTE — Progress Notes (Signed)
Care Management & Coordination Services Pharmacy Team  Reason for Encounter: General adherence update   Contacted patient for general health update and medication adherence call.  Spoke with patient on 05/16/2022    What concerns do you have about your medications? None  The patient denies side effects with their medications.   How often do you forget or accidentally miss a dose? Never  Do you use a pillbox? Yes  Are you having any problems getting your medications from your pharmacy? No  Has the cost of your medications been a concern? No If yes, what medication and is patient assistance available or has it been applied for?  Since last visit with PharmD, no interventions have been made.   The patient has not had an ED visit since last contact.   The patient reports the following problems with their health. Patient is having some pain and weakness in his hip that has caused him to fall.  Patient denies concerns or questions for Leata Mouse, PharmD at this time.   Counseled patient on: Access to carecoordination team for any cost, medication or pharmacy concerns.  Patient states increasing Gabapentin has helped with his tingling pain. He has not yet increase fentanyl to 75 mg as he got this medication filled today.  Chart Updates:  Recent office visits:  04/21/2022 OV (PCP) [name], MD; I have recommended increasing his fentanyl to 75 mcg daily as the patient refuses to use the oxycodone as needed   Recent consult visits:  05/05/2022 OV (Oncology) [name], MD; Recommend to hold off Lasix and losartan.   Hospital visits:  None since last PharmD visit  Medications: Outpatient Encounter Medications as of 05/08/2022  Medication Sig Note   aspirin EC 81 MG tablet Take 1 tablet (81 mg total) by mouth daily. Swallow whole.    atorvastatin (LIPITOR) 40 MG tablet Take 1 tablet (40 mg total) by mouth daily.    Ca Phosphate-Cholecalciferol (CALCIUM WITH D3 PO) Take 1  capsule by mouth daily.    fentaNYL (DURAGESIC) 75 MCG/HR Place 1 patch onto the skin every 3 (three) days. 05/05/2022: Pt. Is still using 77OEU   folic acid (FOLVITE) 235 MCG tablet Take 1 tablet (800 mcg total) by mouth daily.    furosemide (LASIX) 20 MG tablet Take 1 tablet (20 mg total) by mouth daily as needed (leg swelling or sob). 05/05/2022: Patient has been taking 2 tabs a day per PCP.    gabapentin (NEURONTIN) 300 MG capsule Take 1 capsule (300 mg total) by mouth 3 (three) times daily.    insulin glargine (LANTUS) 100 UNIT/ML injection Inject 30 Units into the skin daily as needed. Only takes Lantus when sugar is above 150.    neomycin-polymyxin b-dexamethasone (MAXITROL) 3.5-10000-0.1 SUSP 1 drop 3 (three) times daily.    NOVOLOG FLEXPEN 100 UNIT/ML FlexPen Inject 0-10 Units into the skin daily as needed for high blood sugar (above 200). Sliding scale    omeprazole (PRILOSEC) 20 MG capsule Take 20 mg by mouth daily.    oxyCODONE-acetaminophen (PERCOCET) 7.5-325 MG tablet Take 1 tablet by mouth every 4 (four) hours as needed for severe pain. Do not take with cough medication or other pain medication    Probiotic Product (PROBIOTIC DAILY PO) Take by mouth. Physician's Choice Probiotic    tamsulosin (FLOMAX) 0.4 MG CAPS capsule TAKE 1 CAPSULE(0.4 MG) BY MOUTH DAILY    traZODone (DESYREL) 50 MG tablet Take 1 tablet (50 mg total) by mouth at bedtime as  needed for sleep.    UNABLE TO FIND PLEASE CHECK FASTING BLOOD GLUCOSE EVERY MORNING    valsartan (DIOVAN) 80 MG tablet Take 1 tablet (80 mg total) by mouth daily.    [DISCONTINUED] prochlorperazine (COMPAZINE) 10 MG tablet Take 1 tablet (10 mg total) by mouth every 6 (six) hours as needed (Nausea or vomiting).    Facility-Administered Encounter Medications as of 05/08/2022  Medication   heparin lock flush 100 UNIT/ML injection    Recent vitals BP Readings from Last 3 Encounters:  05/05/22 (!) 92/58  04/21/22 (!) 102/56  04/14/22 (!) 93/59    Pulse Readings from Last 3 Encounters:  05/05/22 90  04/21/22 72  04/14/22 78   Wt Readings from Last 3 Encounters:  05/05/22 149 lb 9.6 oz (67.9 kg)  04/21/22 157 lb (71.2 kg)  04/14/22 154 lb (69.9 kg)   BMI Readings from Last 3 Encounters:  05/05/22 23.43 kg/m  04/21/22 24.59 kg/m  04/14/22 24.12 kg/m    Recent lab results    Component Value Date/Time   NA 133 (L) 05/05/2022 1252   K 3.6 05/05/2022 1252   CL 98 05/05/2022 1252   CO2 26 05/05/2022 1252   GLUCOSE 109 (H) 05/05/2022 1252   BUN 26 (H) 05/05/2022 1252   CREATININE 1.34 (H) 05/05/2022 1252   CREATININE 1.07 02/11/2022 1457   CALCIUM 8.6 (L) 05/05/2022 1252    Lab Results  Component Value Date   CREATININE 1.34 (H) 05/05/2022   EGFR 74 02/11/2022   GFRNONAA 57 (L) 05/05/2022   GFRAA >89 03/05/2016   Lab Results  Component Value Date/Time   HGBA1C 7.7 (H) 12/10/2021 02:20 PM   HGBA1C 13.6 (H) 06/10/2021 12:10 PM   MICROALBUR 0.9 03/06/2016 10:10 AM    Lab Results  Component Value Date   CHOL 266 (H) 06/12/2021   HDL 24 (L) 06/12/2021   LDLCALC 213 (H) 06/12/2021   TRIG 144 06/12/2021   CHOLHDL 11.1 06/12/2021    Care Gaps: Annual wellness visit in last year? No  If Diabetic: Last eye exam / retinopathy screening: 06/02/2016 Last diabetic foot exam: 05/04/2017 Last UACR: 03/06/2016  Star Rating Drugs:  Atorvastatin 40 mg last filled 02/21/2022 90 DS Lantus solostar Pen last filled 03/27/2022 44 DS Valsartan 80 mg last filled 03/07/2022 90 DS   Future Appointments  Date Time Provider [place]  05/14/2022  1:00 PM [facility] [place]  05/15/2022  1:30 PM Borders, Kirt Boys, NP [facility] None  05/26/2022 12:45 PM [facility] LAB [facility] None  05/26/2022  1:00 PM [name], MD [facility] None  05/26/2022  1:15 PM [facility]- MO INFUSION CHAIR 17 [facility] None  06/16/2022  8:15 AM [facility]-PORT FLUSH [facility] None  06/16/2022  8:30 AM [name], MD [facility] None  06/16/2022  8:45 AM [facility] INFUSION  CHAIR 6 [facility] None  06/18/2022  4:00 PM [facility] [place] 1 [facility] None  06/25/2022 10:00 AM Noreene Filbert, MD [facility] None  06/30/2022  2:00 PM Janina Mayo, MD [facility] None  10/17/2022  2:45 PM Edythe Clarity, [facility] None  04/02/2023  3:00 PM [facility]-NURSE HEALTH ADVISOR [facility]   April D Calhoun, [facility] Pharmacist Assistant [phone]

## 2022-05-13 ENCOUNTER — Telehealth: Payer: Self-pay | Admitting: *Deleted

## 2022-05-13 ENCOUNTER — Other Ambulatory Visit: Payer: Self-pay | Admitting: Oncology

## 2022-05-13 MED ORDER — ALPRAZOLAM 0.5 MG PO TABS: 0.5000 mg | ORAL_TABLET | ORAL | 0 refills | Status: DC

## 2022-05-13 NOTE — Telephone Encounter (Signed)
Patient called stating that he is to have an MRI tomorrow and he is claustrophobic and needs medicine to be able to have it done

## 2022-05-14 ENCOUNTER — Ambulatory Visit
Admission: RE | Admit: 2022-05-14 | Discharge: 2022-05-14 | Disposition: A | Discharge status: Home / Self Care | Payer: PPO | Source: Ambulatory Visit | Attending: Oncology | Admitting: Oncology

## 2022-05-14 ENCOUNTER — Encounter: Payer: Self-pay | Admitting: Oncology

## 2022-05-14 DIAGNOSIS — G9389 Other specified disorders of brain: Secondary | ICD-10-CM | POA: Diagnosis not present

## 2022-05-14 DIAGNOSIS — C3412 Malignant neoplasm of upper lobe, left bronchus or lung: Secondary | ICD-10-CM | POA: Diagnosis not present

## 2022-05-14 DIAGNOSIS — C3490 Malignant neoplasm of unspecified part of unspecified bronchus or lung: Secondary | ICD-10-CM | POA: Diagnosis not present

## 2022-05-14 MED ORDER — GADOBUTROL 1 MMOL/ML IV SOLN
6.0000 mL | Freq: Once | INTRAVENOUS | Status: AC | PRN
  Administered 2022-05-14: 6 mL via INTRAVENOUS

## 2022-05-14 NOTE — Telephone Encounter (Signed)
Call returned to patient to let him know that Prescription has been sent to pharmacy for him

## 2022-05-15 ENCOUNTER — Inpatient Hospital Stay (HOSPITAL_BASED_OUTPATIENT_CLINIC_OR_DEPARTMENT_OTHER): Payer: PPO | Admitting: Hospice and Palliative Medicine

## 2022-05-15 DIAGNOSIS — C3412 Malignant neoplasm of upper lobe, left bronchus or lung: Secondary | ICD-10-CM

## 2022-05-15 NOTE — Progress Notes (Signed)
Voicemail left.  Will reschedule. °

## 2022-05-17 DIAGNOSIS — M47816 Spondylosis without myelopathy or radiculopathy, lumbar region: Secondary | ICD-10-CM | POA: Diagnosis not present

## 2022-05-23 DIAGNOSIS — H18413 Arcus senilis, bilateral: Secondary | ICD-10-CM | POA: Diagnosis not present

## 2022-05-23 DIAGNOSIS — H2512 Age-related nuclear cataract, left eye: Secondary | ICD-10-CM | POA: Diagnosis not present

## 2022-05-23 DIAGNOSIS — H2513 Age-related nuclear cataract, bilateral: Secondary | ICD-10-CM | POA: Diagnosis not present

## 2022-05-23 DIAGNOSIS — E113293 Type 2 diabetes mellitus with mild nonproliferative diabetic retinopathy without macular edema, bilateral: Secondary | ICD-10-CM | POA: Diagnosis not present

## 2022-05-23 DIAGNOSIS — H25043 Posterior subcapsular polar age-related cataract, bilateral: Secondary | ICD-10-CM | POA: Diagnosis not present

## 2022-05-26 ENCOUNTER — Other Ambulatory Visit: Payer: Self-pay | Admitting: Oncology

## 2022-05-26 ENCOUNTER — Encounter: Payer: Self-pay | Admitting: Oncology

## 2022-05-26 ENCOUNTER — Inpatient Hospital Stay: Payer: PPO

## 2022-05-26 ENCOUNTER — Inpatient Hospital Stay (HOSPITAL_BASED_OUTPATIENT_CLINIC_OR_DEPARTMENT_OTHER): Payer: PPO | Admitting: Oncology

## 2022-05-26 VITALS — BP 96/50 | HR 71 | Temp 96.0°F | Resp 18 | Wt 149.4 lb

## 2022-05-26 DIAGNOSIS — D520 Dietary folate deficiency anemia: Secondary | ICD-10-CM

## 2022-05-26 DIAGNOSIS — E861 Hypovolemia: Secondary | ICD-10-CM

## 2022-05-26 DIAGNOSIS — M7989 Other specified soft tissue disorders: Secondary | ICD-10-CM | POA: Diagnosis not present

## 2022-05-26 DIAGNOSIS — C3412 Malignant neoplasm of upper lobe, left bronchus or lung: Secondary | ICD-10-CM

## 2022-05-26 DIAGNOSIS — I9589 Other hypotension: Secondary | ICD-10-CM

## 2022-05-26 DIAGNOSIS — I2699 Other pulmonary embolism without acute cor pulmonale: Secondary | ICD-10-CM | POA: Diagnosis not present

## 2022-05-26 DIAGNOSIS — C7951 Secondary malignant neoplasm of bone: Secondary | ICD-10-CM

## 2022-05-26 DIAGNOSIS — I619 Nontraumatic intracerebral hemorrhage, unspecified: Secondary | ICD-10-CM | POA: Diagnosis not present

## 2022-05-26 DIAGNOSIS — G893 Neoplasm related pain (acute) (chronic): Secondary | ICD-10-CM

## 2022-05-26 DIAGNOSIS — R29898 Other symptoms and signs involving the musculoskeletal system: Secondary | ICD-10-CM

## 2022-05-26 DIAGNOSIS — Z5112 Encounter for antineoplastic immunotherapy: Secondary | ICD-10-CM

## 2022-05-26 LAB — COMPREHENSIVE METABOLIC PANEL
ALT: 10 U/L (ref 0–44)
AST: 20 U/L (ref 15–41)
Albumin: 3.3 g/dL — ABNORMAL LOW (ref 3.5–5.0)
Alkaline Phosphatase: 85 U/L (ref 38–126)
Anion gap: 6 (ref 5–15)
BUN: 25 mg/dL — ABNORMAL HIGH (ref 8–23)
CO2: 25 mmol/L (ref 22–32)
Calcium: 9.3 mg/dL (ref 8.9–10.3)
Chloride: 101 mmol/L (ref 98–111)
Creatinine, Ser: 1.64 mg/dL — ABNORMAL HIGH (ref 0.61–1.24)
GFR, Estimated: 44 mL/min — ABNORMAL LOW (ref >60)
Glucose, Bld: 111 mg/dL — ABNORMAL HIGH (ref 70–99)
Potassium: 3.9 mmol/L (ref 3.5–5.1)
Sodium: 132 mmol/L — ABNORMAL LOW (ref 135–145)
Total Bilirubin: 0.3 mg/dL (ref 0.3–1.2)
Total Protein: 7.2 g/dL (ref 6.5–8.1)

## 2022-05-26 LAB — CBC WITH DIFFERENTIAL/PLATELET
Abs Immature Granulocytes: 0.03 10*3/uL (ref 0.00–0.07)
Basophils Absolute: 0 10*3/uL (ref 0.0–0.1)
Basophils Relative: 0 %
Eosinophils Absolute: 0.2 10*3/uL (ref 0.0–0.5)
Eosinophils Relative: 3 %
HCT: 29.4 % — ABNORMAL LOW (ref 39.0–52.0)
Hemoglobin: 9.4 g/dL — ABNORMAL LOW (ref 13.0–17.0)
Immature Granulocytes: 1 %
Lymphocytes Relative: 14 %
Lymphs Abs: 0.9 10*3/uL (ref 0.7–4.0)
MCH: 31.9 pg (ref 26.0–34.0)
MCHC: 32 g/dL (ref 30.0–36.0)
MCV: 99.7 fL (ref 80.0–100.0)
Monocytes Absolute: 0.5 10*3/uL (ref 0.1–1.0)
Monocytes Relative: 9 %
Neutro Abs: 4.6 10*3/uL (ref 1.7–7.7)
Neutrophils Relative %: 73 %
Platelets: 293 10*3/uL (ref 150–400)
RBC: 2.95 MIL/uL — ABNORMAL LOW (ref 4.22–5.81)
RDW: 13.4 % (ref 11.5–15.5)
WBC: 6.2 10*3/uL (ref 4.0–10.5)
nRBC: 0 % (ref 0.0–0.2)

## 2022-05-26 LAB — TSH: TSH: 2.443 u[IU]/mL (ref 0.350–4.500)

## 2022-05-26 MED ORDER — SODIUM CHLORIDE 0.9 % IV SOLN
Freq: Once | INTRAVENOUS | Status: AC
  Filled 2022-05-26: qty 250

## 2022-05-26 MED ORDER — SODIUM CHLORIDE 0.9 % IV SOLN
200.0000 mg | Freq: Once | INTRAVENOUS | Status: AC
  Administered 2022-05-26: 200 mg via INTRAVENOUS
  Filled 2022-05-26: qty 8

## 2022-05-26 NOTE — Assessment & Plan Note (Addendum)
normal cortisol and ACTH  Continue to monitor.  Recommend patient to continue hold off BP meds.

## 2022-05-26 NOTE — Assessment & Plan Note (Addendum)
Back pain, MRI lumbar showed compression fracture Continue PRN percocet, recently added fentanyl patch recommend Flexeril  MRI lumbar showed DJD, L4-L5 synovial cyst, no new bone mets.  Patient gets epidural steroid injections.

## 2022-05-26 NOTE — Patient Instructions (Signed)
[facility]  Discharge Instructions: Thank you for choosing [facility] to provide your oncology and hematology care.  If you have a lab appointment with the Cancer Center, please go directly to the Cancer Center and check in at the registration area.  Wear comfortable clothing and clothing appropriate for easy access to any Portacath or PICC line.   We strive to give you quality time with your provider. You may need to reschedule your appointment if you arrive late (15 or more minutes).  Arriving late affects you and other patients whose appointments are after yours.  Also, if you miss three or more appointments without notifying the office, you may be dismissed from the clinic at the provider's discretion.      For prescription refill requests, have your pharmacy contact our office and allow 72 hours for refills to be completed.    Today you received the following chemotherapy and/or immunotherapy agents KEYTRUDA      To help prevent nausea and vomiting after your treatment, we encourage you to take your nausea medication as directed.  BELOW ARE SYMPTOMS THAT SHOULD BE REPORTED IMMEDIATELY: *FEVER GREATER THAN 100.4 F (38 C) OR HIGHER *CHILLS OR SWEATING *NAUSEA AND VOMITING THAT IS NOT CONTROLLED WITH YOUR NAUSEA MEDICATION *UNUSUAL SHORTNESS OF BREATH *UNUSUAL BRUISING OR BLEEDING *URINARY PROBLEMS (pain or burning when urinating, or frequent urination) *BOWEL PROBLEMS (unusual diarrhea, constipation, pain near the anus) TENDERNESS IN MOUTH AND THROAT WITH OR WITHOUT PRESENCE OF ULCERS (sore throat, sores in mouth, or a toothache) UNUSUAL RASH, SWELLING OR PAIN  UNUSUAL VAGINAL DISCHARGE OR ITCHING   Items with * indicate a potential emergency and should be followed up as soon as possible or go to the Emergency Department if any problems should occur.  Please show the CHEMOTHERAPY ALERT CARD or IMMUNOTHERAPY ALERT CARD at check-in to  the Emergency Department and triage nurse.  Should you have questions after your visit or need to cancel or reschedule your appointment, please contact [facility]  [phone] and follow the prompts.  Office hours are 8:00 a.m. to 4:30 p.m. Monday - Friday. Please note that voicemails left after 4:00 p.m. may not be returned until the following business day.  We are closed weekends and major holidays. You have access to a nurse at all times for urgent questions. Please call the main number to the clinic [phone] and follow the prompts.  For any non-urgent questions, you may also contact your provider using MyChart. We now offer e-Visits for anyone 18 and older to request care online for non-urgent symptoms. For details visit [email or web address].   Also download the MyChart app! Go to the app store, search "MyChart", open the app, select [facility], and log in with your MyChart username and password. . Pembrolizumab Injection What is this medication? PEMBROLIZUMAB (PEM broe LIZ ue mab) treats some types of cancer. It works by helping your immune system slow or stop the spread of cancer cells. It is a monoclonal antibody. This medicine may be used for other purposes; ask your health care provider or pharmacist if you have questions. COMMON BRAND NAME(S): Keytruda What should I tell my care team before I take this medication? They need to know if you have any of these conditions: Allogeneic stem cell transplant (uses someone else's stem cells) Autoimmune diseases, such as Crohn disease, ulcerative colitis, lupus History of chest radiation Nervous system problems, such as Guillain-Barre syndrome, myasthenia gravis   Organ transplant An unusual or allergic reaction to pembrolizumab, other medications, foods, dyes, or preservatives Pregnant or trying to get pregnant Breast-feeding How should I use this medication? This medication is injected into a vein.  It is given by your care team in a hospital or clinic setting. A special MedGuide will be given to you before each treatment. Be sure to read this information carefully each time. Talk to your care team about the use of this medication in children. While it may be prescribed for children as young as 6 months for selected conditions, precautions do apply. Overdosage: If you think you have taken too much of this medicine contact a poison control center or emergency room at once. NOTE: This medicine is only for you. Do not share this medicine with others. What if I miss a dose? Keep appointments for follow-up doses. It is important not to miss your dose. Call your care team if you are unable to keep an appointment. What may interact with this medication? Interactions have not been studied. This list may not describe all possible interactions. Give your health care provider a list of all the medicines, herbs, non-prescription drugs, or dietary supplements you use. Also tell them if you smoke, drink alcohol, or use illegal drugs. Some items may interact with your medicine. What should I watch for while using this medication? Your condition will be monitored carefully while you are receiving this medication. You may need blood work while taking this medication. This medication may cause serious skin reactions. They can happen weeks to months after starting the medication. Contact your care team right away if you notice fevers or flu-like symptoms with a rash. The rash may be red or purple and then turn into blisters or peeling of the skin. You may also notice a red rash with swelling of the face, lips, or lymph nodes in your neck or under your arms. Tell your care team right away if you have any change in your eyesight. Talk to your care team if you may be pregnant. Serious birth defects can occur if you take this medication during pregnancy and for 4 months after the last dose. You will need a negative  pregnancy test before starting this medication. Contraception is recommended while taking this medication and for 4 months after the last dose. Your care team can help you find the option that works for you. Do not breastfeed while taking this medication and for 4 months after the last dose. What side effects may I notice from receiving this medication? Side effects that you should report to your care team as soon as possible: Allergic reactions--skin rash, itching, hives, swelling of the face, lips, tongue, or throat Dry cough, shortness of breath or trouble breathing Eye pain, redness, irritation, or discharge with blurry or decreased vision Heart muscle inflammation--unusual weakness or fatigue, shortness of breath, chest pain, fast or irregular heartbeat, dizziness, swelling of the ankles, feet, or hands Hormone gland problems--headache, sensitivity to light, unusual weakness or fatigue, dizziness, fast or irregular heartbeat, increased sensitivity to cold or heat, excessive sweating, constipation, hair loss, increased thirst or amount of urine, tremors or shaking, irritability Infusion reactions--chest pain, shortness of breath or trouble breathing, feeling faint or lightheaded Kidney injury (glomerulonephritis)--decrease in the amount of urine, red or dark brown urine, foamy or bubbly urine, swelling of the ankles, hands, or feet Liver injury--right upper belly pain, loss of appetite, nausea, light-colored stool, dark yellow or brown urine, yellowing skin or eyes, unusual weakness   or fatigue Pain, tingling, or numbness in the hands or feet, muscle weakness, change in vision, confusion or trouble speaking, loss of balance or coordination, trouble walking, seizures Rash, fever, and swollen lymph nodes Redness, blistering, peeling, or loosening of the skin, including inside the mouth Sudden or severe stomach pain, bloody diarrhea, fever, nausea, vomiting Side effects that usually do not require  medical attention (report to your care team if they continue or are bothersome): Bone, joint, or muscle pain Diarrhea Fatigue Loss of appetite Nausea Skin rash This list may not describe all possible side effects. Call your doctor for medical advice about side effects. You may report side effects to FDA at 1-800-FDA-1088. Where should I keep my medication? This medication is given in a hospital or clinic. It will not be stored at home. NOTE: This sheet is a summary. It may not cover all possible information. If you have questions about this medicine, talk to your doctor, pharmacist, or health care provider.  2023 Elsevier/Gold Standard (2021-07-02 00:00:00)    

## 2022-05-26 NOTE — Assessment & Plan Note (Signed)
Continue folic acid supplementation.   

## 2022-05-26 NOTE — Assessment & Plan Note (Signed)
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  PET scan images were reviewed with patient.- good partial response.  Hold  maintenance Alimta/Keytruda  -dose reduce Alimta 350mg/m2 Due to decrease PS, discontinue Alimta.  Labs are reviewed and discussed with patient. Proceed with Keytruda CT chest abdomen pelvis wo contrast- stable disease 

## 2022-05-26 NOTE — Progress Notes (Signed)
Hematology/Oncology Progress note [phone] [phone]       CHIEF COMPLAINTS/REASON FOR VISIT:  Metastatic non-small cell lung cancer  ASSESSMENT & PLAN:   Cancer Staging  Primary non-small cell carcinoma of upper lobe of left lung (HCC) Staging form: Lung, AJCC 8th Edition - Clinical: Stage IV (cT2b, cN1, cM1) - Signed by [name], MD on 08/06/2021   Primary non-small cell carcinoma of upper lobe of left lung ([place]) Stage IV lung adenocarcinoma with brain and bone metastasis.  PET scan images were reviewed with patient.- good partial response.  Hold  maintenance Alimta/Keytruda  -dose reduce Alimta 350mg /m2 Due to decrease PS, discontinue Alimta.  Labs are reviewed and discussed with patient. Proceed with Keytruda CT chest abdomen pelvis wo contrast- stable disease  Encounter for antineoplastic immunotherapy Immunotherapy plan as listed above  Folate deficiency anemia Continue folic acid supplementation.    Intraparenchymal hemorrhage of brain (HCC) Hold Eliquis.  Follow-up with Dr. Mickeal Skinner Repeat MRI brain stable.   Neoplasm related pain Back pain, MRI lumbar showed compression fracture Continue PRN percocet, recently added fentanyl patch recommend Flexeril  MRI lumbar showed DJD, L4-L5 synovial cyst, no new bone mets.  Patient gets epidural steroid injections.    Pulmonary embolus (HCC) MRI brain shows persistent microbleeds.   Off Eliquis 2.5mg  BID.   Hypotension normal cortisol and ACTH  Continue to monitor.  Recommend patient to continue hold off BP meds.  Metastasis to bone (HCC) Recommend Zometa every 4- 6 weeks.-proceed today.  Continue calcium supplementation.  MRI lumbar with and without contrast showed  Stable remote superior endplate fracture of L2 and large Schmorl to noted involving L5. 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  Swelling of lower  extremity Currently swelling is minimal. BP is low, recommend patient to hold off Lasix.  Weakness of left lower extremity Recommend patient to follow-up with neurology.     Follow up 3 weeks lab MD Keytruda. All questions were answered. The patient knows to call the clinic with any problems, questions or concerns.  [name], MD, [name] [facility] Hematology Oncology 05/26/2022      HISTORY OF PRESENTING ILLNESS:   Micheal Donovan is a  72 y.o.  male presents for follow up of Non-small cell lung cancer.  Oncology History Overview Note  Diagnosis: Stage IIB T2b N1 M0 adenocarcinoma of the LUL, poorly differentiated     Primary non-small cell carcinoma of upper lobe of left lung ([place])  06/13/2021 Initial Diagnosis   Primary non-small cell carcinoma of upper lobe of left lung [facility]) -06/20/2021 - 06/27/2021, patient presented to [facility] due to progressive headache/dizziness/gait changes. CT head showed acute to subacute intraparenchymal hemorrhage involving the left cerebellum.  Surrounding low-density vasogenic edema.  Patient was transferred to [facility]. This was further evaluated by CT angiogram of the neck which showed bulky calcified plaques/stenosis of carotid artery, Followed by MRI brain. 06/12/2021, MRI of the brain showed no significant interval change in size of the left cerebellar intraparenchymal hematoma with unchanged regional mass effect and partial effacement of fourth ventricle but no upstream hydrocephalus. There is no discernible enhancement to suggest underlying mass lesion, though acute blood products could mask enhancement.   06/12/2021 a chest x-ray showed a 4.4 cm left middle lobe. 06/13/2021, CT chest with contrast showed a 4.3 x 3.6 x 3.3 cm lobular spiculated mass in the posterior left upper lobe with T3 to the lateral pleura  and major fissure.  Metastatic left hilar lymphadenopathy.  Peripheral micronodularity posterior right costophrenic sulcus.   Aortic atherosclerosis. 06/14/2021, CT abdomen pelvis showed right lower lobe pulmonary artery embolus.  No evidence of right heart strain.  No acute intra-abdominal or pelvic pathology.  Aortic atherosclerosis.  06/13/2021, patient underwent bronchoscopy with EBUS by Dr. Tamala Julian.  Biopsy from the fine-needle aspiration of station 11 mL showed malignant cells, consistent with poorly differentiated non-small cell carcinoma, consistent with adenocarcinoma.  Malignant cells are TTF-1 positive and negative for p40.  Negative for neuroendocrine markers.  Blood test and tissue sample were tested for Gardant 360  -PD-L1 TPS 97%, no actionable mutation on the blood testing. 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   07/17/2021 Cancer Staging   Staging form: Lung, AJCC 8th Edition - Clinical: Stage IV (cT2b, cN1, cM1) - Signed by [name], MD on 08/06/2021   08/06/2021 Imaging   I saw patient's PET scan after his visit with me on 08/06/21. PET scan was ordered by his previous oncologist group and did not come to my in basket.. Patient received cycle 1 carboplatin Taxol today.  He has started on radiation.  5/26/3 PET scan showed 3.8 cm hypermetabolic left upper lobe mass with hypermetabolic left hilar and infrahilar adenopathy.  There is approximately 8 scattered metastatic lesions in the skeleton. Mixed density photopenic lesion in the left cerebellum. characterized as hemorrhage on recent prior imaging workups.    08/16/2021 -  Chemotherapy   Patient is on Treatment Plan : LUNG Carboplatin (5) + Pemetrexed + Pembrolizumab (200) D1 q21d Induction x 4 cycles / Maintenance Pemetrexed + Pembrolizumab (200) D1 q21d      08/22/2021 Imaging   MRI brain w wo contrast  Decrease in size of left cerebellar hematoma with resolution of edema. No evidence of underlying lesion. Smaller, more recent hemorrhage in the left cerebellar vermis with minimal edema. There is minimal enhancement without definite  evidence of underlying lesion.  New punctate focus of chronic blood products and enhancement in the left frontal lobe. Additional new foci of chronic blood products in the posterior right putamen, right parietal subcortical white matter, and posterior right cerebellum. Unclear at this time if these represent foci of bland hemorrhage or early metastases.   Increase in size of right parietal osseous metastasis with minor extraosseous extension.     Imaging   PET scan showed 1. Interval response to therapy as evidenced by a small residual left upper lobe nodule with decreased hypermetabolism, no residual hypermetabolic adenopathy and decreased hypermetabolism associatedwith osseous metastases. 2. 6 mm posterior left upper lobe nodule, likely stable. Recommend attention on follow-up. 3. Aortic atherosclerosis (ICD10-I70.0). Coronary artery calcification.   12/17/2021 Imaging   MRI lumbar spine w wo contrast  1. No evidence of regional metastatic disease. 2. Late subacute superior endplate fracture at L2 and large superior endplate Schmorl's node at L5, unchanged since the PET scan of 10/29/2021. 3. L3-4: Shallow disc protrusion. Facet and ligamentous hypertrophy. Stenosis of both lateral recesses. Findings slightly worsened since 2022. 4. L4-5: Shallow disc protrusion. Facet and ligamentous hypertrophy. Small synovial cyst arising from the facet joint on the right. Stenosis of the lateral recesses right worse than left. Foraminal narrowing right worse than left. Findings have worsened since 2022.  5. L5-S1: Endplate osteophytes and shallow protrusion of the disc. Facet and ligamentous hypertrophy. Stenosis of the subarticular lateral recesses and neural foramina, right worse than left. Similar appearance to the study of 2022. 6. Continued evidence  of enteritis of at least 1 loop of small bowel. Distended bladder present   12/18/2021 Imaging   Brian MRI w wo  1. Interval decrease in size of the  left cerebellar and vermian hematomas. No definite evidence of underlying metastatic lesion. 2. There is are two possible contrast enhancing lesions in the posterior left frontal lobe and left occipital lobe, which do not have intrinsic T1 signal abnormality, but have a somewhat linear appearance and may be vascular in nature. Recommend attention on follow up. 3. Multifocal sites of susceptibility artifact are redemonstrated with interval development of a few new foci, as described above. All of these sites demonstrate intrinsic T1 hyperintense signal abnormality and susceptibility artifact and are compatible with sites of microhemorrhages. Recommend continued attention on follow up. 4. Interval decrease in size of right parietal calvarial metastatic lesion. No new contrast enhancing lesions visualized. 5. Diffusely heterogeneous marrow signal throughout the cervical spine, which is nonspecific but can be seen in the setting of anemia, smoking, obesity, or a marrow replacement process.     12/18/2021 Imaging   MRI lumbar spine w wo contrast  1. No evidence of regional metastatic disease. 2. Late subacute superior endplate fracture at L2 and large superior endplate Schmorl's node at L5, unchanged since the PET scan of 10/29/2021 3. L3-4: Shallow disc protrusion. Facet and ligamentous hypertrophy.Stenosis of both lateral recesses. Findings slightly worsened since 2022. 4. L4-5: Shallow disc protrusion. Facet and ligamentous hypertrophy. Small synovial cyst arising from the facet joint on the right. Stenosis of the lateral recesses right worse than left. Foraminal narrowing right worse than left. Findings have worsened since 2022. 5. L5-S1: Endplate osteophytes and shallow protrusion of the disc. Facet and ligamentous hypertrophy. Stenosis of the subarticular lateral recesses and neural foramina, right worse than left. Similar appearance to the study of 2022. 6. Continued evidence of enteritis of at  least 1 loop of small bowel. Distended bladder present.       01/28/2022 Imaging   CT chest abdomen pelvis w contrast 1. Treated left upper lobe mass appears grossly stable to the prior examination when measured in a similar fashion on the prior study. No definite signs of extra skeletal metastatic disease noted in the chest, abdomen or pelvis. 2. Widespread skeletal metastases redemonstrated, as above. 3. Hepatic steatosis. 4. Aortic atherosclerosis, in addition to left main and three-vessel coronary artery disease. Assessment for potential risk factor modification, dietary therapy or pharmacologic therapy may be warranted, if clinically indicated. 5. Additional incidental findings,    04/08/2022 Imaging   MRI lumbar spine w wo contrast  1. Stable remote superior endplate fracture of L2 and large Schmorl to noted involving L5. No worrisome bone lesions to suggest metastatic disease. 2. Degenerative lumbar spondylosis with multilevel disc disease and facet disease which appears relatively stable as detailed above. 3. Enlarging right-sided synovial cyst at L4-5 with progressive mass effect on the right side of the thecal sac. This could potentially be symptomatic   04/28/2022 Imaging   CT chest abdomen pelvis wo contrast  1. Similar versus slightly decreased size of treated left upper lobe primary bronchogenic carcinoma. 2. Similar osseous metastasis. 3. No evidence of extraosseous metastatic disease. 4. New small right pleural effusion. 5. Coronary artery atherosclerosis. Aortic Atherosclerosis   # Patient has a history of melanoma on his neck, treated in 2009. # History of hemorrhagic infarct left cerebellum  INTERVAL HISTORY ZACKERY CAO is a 72 y.o. male who has above history reviewed by me today  presents for follow up visit for management of  Stage IV lung adenocarcianoma # Pulmonary embolism, SOB is stable, not worse. Off Eliquis due to MRI findings.  + Insomnia and nocturia. On  Ativan QHS, not helping - he takes frequent naps during the day + Lower extremity swelling, he continues to feel that left lower extremity is swollen.  He takes Lasix daily. Stable weight. + back pain, on fentanyl patch and percocet, he sees orthopedic surgeon for epidural injections.  He has noticed a left lower extremity weakness for the past few weeks. Accompanied by wife.    Review of Systems  Constitutional:  Positive for fatigue. Negative for appetite change, chills, fever and unexpected weight change.  HENT:   Negative for hearing loss and voice change.   Eyes:  Negative for eye problems and icterus.  Respiratory:  Positive for shortness of breath. Negative for chest tightness and cough.   Cardiovascular:  Positive for leg swelling. Negative for chest pain.  Gastrointestinal:  Negative for abdominal distention, abdominal pain and blood in stool.  Endocrine: Negative for hot flashes.  Genitourinary:  Negative for difficulty urinating, dysuria and frequency.   Musculoskeletal:  Positive for back pain. Negative for arthralgias.  Skin:  Negative for itching and rash.  Neurological:  Negative for extremity weakness, headaches, light-headedness and numbness.  Hematological:  Negative for adenopathy. Does not bruise/bleed easily.  Psychiatric/Behavioral:  Positive for sleep disturbance. Negative for confusion.     MEDICAL HISTORY:  Past Medical History:  Diagnosis Date   Allergy    BPH (benign prostatic hypertrophy)    Cancer (HCC)    Melanoma on Neck    2008   Carotid artery occlusion    CHF (congestive heart failure) (HCC)    Diabetes mellitus    type 2   ED (erectile dysfunction)    GERD (gastroesophageal reflux disease)    Hemorrhagic stroke ([place]) 06/2021   Hyperlipidemia    Hypertension    Lung cancer ([place])    Neoplasm related pain    Retinopathy due to secondary DM ([place])     SURGICAL HISTORY: Past Surgical History:  Procedure Laterality Date   BRONCHIAL NEEDLE  ASPIRATION BIOPSY  06/17/2021   Procedure: BRONCHIAL NEEDLE ASPIRATION BIOPSIES;  Surgeon: Candee Furbish, MD;  Location: [place];  Service: Pulmonary;;   IR IMAGING GUIDED PORT INSERTION  08/05/2021   MELANOMA EXCISION  2008   Left side of neck   RADIOLOGY WITH ANESTHESIA N/A 04/05/2020   Procedure: MRI SPINE WITOUT CONTRAST;  Surgeon: Radiologist, Medication, MD;  Location: [place];  Service: Radiology;  Laterality: N/A;   RADIOLOGY WITH ANESTHESIA N/A 08/22/2021   Procedure: MRI BRAIN WITH AND WITHOUT CONTRAST  WITH ANESTHESIA;  Surgeon: Radiologist, Medication, MD;  Location: [place];  Service: Radiology;  Laterality: N/A;   RADIOLOGY WITH ANESTHESIA N/A 12/17/2021   Procedure: MRI BRAIN WITH AND WITHOUT CONTRAST WITH ANESTHESIA; MRI LUMBER WITH AND WITHOUT CONTRAST;  Surgeon: Radiologist, Medication, MD;  Location: [place];  Service: Radiology;  Laterality: N/A;   RADIOLOGY WITH ANESTHESIA N/A 04/08/2022   Procedure: MRI LUMBER SPINE WITH AND WITHOUT CONTRAST;  Surgeon: Radiologist, Medication, MD;  Location: [place];  Service: Radiology;  Laterality: N/A;   TONSILLECTOMY     VIDEO BRONCHOSCOPY WITH ENDOBRONCHIAL ULTRASOUND N/A 06/17/2021   Procedure: VIDEO BRONCHOSCOPY WITH ENDOBRONCHIAL ULTRASOUND;  Surgeon: Candee Furbish, MD;  Location: [facility] ENDOSCOPY;  Service: Pulmonary;  Laterality: N/A;    SOCIAL HISTORY: Social History   Socioeconomic  History   Marital status: Married    Spouse name: Not on file   Number of children: Not on file   Years of education: Not on file   Highest education level: Not on file  Occupational History   Not on file  Tobacco Use   Smoking status: Former    Types: Cigarettes    Quit date: 07/21/1990    Years since quitting: 31.8   Smokeless tobacco: Never  Vaping Use   Vaping Use: Never used  Substance and Sexual Activity   Alcohol use: No   Drug use: No   Sexual activity: Not Currently  Other Topics Concern   Not on file  Social History Narrative   Not  on file   Social Determinants of Health   Financial Resource Strain: Low Risk  (04/17/2022)   Overall Financial Resource Strain (CARDIA)    Difficulty of Paying Living Expenses: Not hard at all  Food Insecurity: No Food Insecurity (04/17/2022)   Hunger Vital Sign    Worried About Running Out of Food in the Last Year: Never true    [place] in the Last Year: Never true  Transportation Needs: No Transportation Needs (03/27/2022)   PRAPARE - Hydrologist (Medical): No    Lack of Transportation (Non-Medical): No  Physical Activity: Inactive (03/27/2022)   Exercise Vital Sign    Days of Exercise per Week: 0 days    Minutes of Exercise per Session: 0 min  Stress: Stress Concern Present (03/27/2022)   [place]    Feeling of Stress : To some extent  Social Connections: Moderately Isolated (03/27/2022)   Social Connection and Isolation Panel [NHANES]    Frequency of Communication with Friends and Family: More than three times a week    Frequency of Social Gatherings with Friends and Family: Three times a week    Attends Religious Services: Never    Active Member of Clubs or Organizations: No    Attends Archivist Meetings: Never    Marital Status: Married  Human resources officer Violence: Not At Risk (03/27/2022)   Humiliation, Afraid, Rape, and Kick questionnaire    Fear of Current or Ex-Partner: No    Emotionally Abused: No    Physically Abused: No    Sexually Abused: No    FAMILY HISTORY: Family History  Problem Relation Age of Onset   COPD Mother    Heart disease Father    Heart disease Brother        MI at age 84   Stroke Neg Hx     ALLERGIES:  is allergic to niaspan [niacin].  MEDICATIONS:  Current Outpatient Medications  Medication Sig Dispense Refill   ALPRAZolam (XANAX) 0.5 MG tablet Take 1 tablet (0.5 mg total) by mouth See admin instructions. Take 1 tablet 30  minutes prior to imaging procedure, may take another tablet if needed. 2 tablet 0   aspirin EC 81 MG tablet Take 1 tablet (81 mg total) by mouth daily. Swallow whole. 30 tablet 12   atorvastatin (LIPITOR) 40 MG tablet Take 1 tablet (40 mg total) by mouth daily. 60 tablet 2   Ca Phosphate-Cholecalciferol (CALCIUM WITH D3 PO) Take 1 capsule by mouth daily.     fentaNYL (DURAGESIC) 75 MCG/HR Place 1 patch onto the skin every 3 (three) days. 10 patch 0   folic acid (FOLVITE) Q000111Q MCG tablet Take 1 tablet (800 mcg total) by mouth  daily. 60 tablet 1   furosemide (LASIX) 20 MG tablet Take 1 tablet (20 mg total) by mouth daily as needed (leg swelling or sob). 30 tablet 3   gabapentin (NEURONTIN) 300 MG capsule Take 1 capsule (300 mg total) by mouth 3 (three) times daily. 90 capsule 1   insulin glargine (LANTUS) 100 UNIT/ML injection Inject 30 Units into the skin daily as needed. Only takes Lantus when sugar is above 150.     neomycin-polymyxin b-dexamethasone (MAXITROL) 3.5-10000-0.1 SUSP 1 drop 3 (three) times daily.     NOVOLOG FLEXPEN 100 UNIT/ML FlexPen Inject 0-10 Units into the skin daily as needed for high blood sugar (above 200). Sliding scale     omeprazole (PRILOSEC) 20 MG capsule Take 20 mg by mouth daily.     oxyCODONE-acetaminophen (PERCOCET) 7.5-325 MG tablet Take 1 tablet by mouth every 4 (four) hours as needed for severe pain. Do not take with cough medication or other pain medication 60 tablet 0   Probiotic Product (PROBIOTIC DAILY PO) Take by mouth. Physician's Choice Probiotic     tamsulosin (FLOMAX) 0.4 MG CAPS capsule TAKE 1 CAPSULE(0.4 MG) BY MOUTH DAILY 90 capsule 0   traZODone (DESYREL) 50 MG tablet Take 1 tablet (50 mg total) by mouth at bedtime as needed for sleep. 30 tablet 3   UNABLE TO FIND PLEASE CHECK FASTING BLOOD GLUCOSE EVERY MORNING 1 each 0   valsartan (DIOVAN) 80 MG tablet Take 1 tablet (80 mg total) by mouth daily. 90 tablet 3   No current facility-administered  medications for this visit.   Facility-Administered Medications Ordered in Other Visits  Medication Dose Route Frequency Provider Last Rate Last Admin   heparin lock flush 100 UNIT/ML injection              PHYSICAL EXAMINATION:  Vitals:   05/26/22 1309  BP: (!) 96/50  Pulse: 71  Resp: 18  Temp: (!) 96 F (35.6 C)  SpO2: 100%   Filed Weights   05/26/22 1309  Weight: 149 lb 6.4 oz (67.8 kg)    Physical Exam Constitutional:      General: He is not in acute distress.    Appearance: He is ill-appearing.  HENT:     Head: Normocephalic and atraumatic.  Eyes:     General: No scleral icterus. Cardiovascular:     Rate and Rhythm: Normal rate and regular rhythm.     Heart sounds: Normal heart sounds.  Pulmonary:     Effort: Pulmonary effort is normal. No respiratory distress.     Breath sounds: No wheezing.     Comments: Decreased breath sound bilaterally. Abdominal:     General: Bowel sounds are normal. There is no distension.     Palpations: Abdomen is soft.  Musculoskeletal:        General: No deformity. Normal range of motion.     Cervical back: Normal range of motion and neck supple.     Comments: Bilateral ankle trace edema.  Skin:    General: Skin is warm and dry.     Findings: No rash.  Neurological:     Mental Status: He is alert. Mental status is at baseline.     Cranial Nerves: No cranial nerve deficit.     Coordination: Coordination normal.  Psychiatric:        Mood and Affect: Mood normal.     LABORATORY DATA:  I have reviewed the data as listed    Latest Ref Rng & Units 05/26/2022  12:52 PM 05/05/2022   12:52 PM 04/14/2022   10:14 AM  CBC  WBC 4.0 - 10.5 K/uL 6.2  6.0  12.6   Hemoglobin 13.0 - 17.0 g/dL 9.4  9.7  8.7   Hematocrit 39.0 - 52.0 % 29.4  30.5  27.0   Platelets 150 - 400 K/uL 293  317  375       Latest Ref Rng & Units 05/05/2022   12:52 PM 04/14/2022   10:14 AM 04/04/2022   10:07 AM  CMP  Glucose 70 - 99 mg/dL 109  169  176   BUN 8  - 23 mg/dL 26  18  16    Creatinine 0.61 - 1.24 mg/dL 1.34  1.22  1.40   Sodium 135 - 145 mmol/L 133  132  135   Potassium 3.5 - 5.1 mmol/L 3.6  4.2  3.6   Chloride 98 - 111 mmol/L 98  99  101   CO2 22 - 32 mmol/L 26  22  24    Calcium 8.9 - 10.3 mg/dL 8.6  8.3  8.8   Total Protein 6.5 - 8.1 g/dL 6.5  6.2  6.1   Total Bilirubin 0.3 - 1.2 mg/dL 0.3  0.2  0.3   Alkaline Phos 38 - 126 U/L 85  101  112   AST 15 - 41 U/L 20  25  30    ALT 0 - 44 U/L 10  12  14       Iron/TIBC/Ferritin/ %Sat    Component Value Date/Time   IRON 60 02/03/2022 0958   TIBC 391 02/03/2022 0958   FERRITIN 704 (H) 02/03/2022 0958   IRONPCTSAT 15 (L) 02/03/2022 [id number]      RADIOGRAPHIC STUDIES: I have personally reviewed the radiological images as listed and agreed with the findings in the report. MR Brain W Wo Contrast  Result Date: 05/18/2022 CLINICAL DATA:  Lung carcinoma follow-up EXAM: MRI HEAD WITHOUT AND WITH CONTRAST TECHNIQUE: Multiplanar, multiecho pulse sequences of the brain and surrounding structures were obtained without and with intravenous contrast. CONTRAST:  52mL GADAVIST GADOBUTROL 1 MMOL/ML IV SOLN COMPARISON:  12/17/2021 FINDINGS: Brain: No acute infarct, mass effect or extra-axial collection. Approximately 10 scattered chronic microhemorrhages in a peripheral distribution, unchanged. There is multifocal hyperintense T2-weighted signal within the white matter. Generalized volume loss. Chronic left cerebellar lesion with siderosis. The midline structures are normal. There are no contrast-enhancing lesions within the brain. Vascular: Normal flow voids. Skull and upper cervical spine: Normal marrow signal. Unchanged appearance of right parietal calvarial lesion. Sinuses/Orbits: Bilateral maxillary sinus mucosal disease. Other: None IMPRESSION: 1. No intraparenchymal metastatic disease. 2. Unchanged appearance of right parietal calvarial lesion. 3. Unchanged appearance of multiple sites of prior hemorrhage,  the largest being in the left cerebellar hemisphere Electronically Signed   By: Ulyses Jarred M.D.   On: 05/18/2022 01:21   CT CHEST ABDOMEN PELVIS WO CONTRAST  Result Date: 04/28/2022 CLINICAL DATA:  Non-small-cell carcinoma of left upper lobe. Known osseous metastasis. Adenocarcinoma with brain and bone metastasis. * Tracking Code: BO * EXAM: CT CHEST, ABDOMEN AND PELVIS WITHOUT CONTRAST TECHNIQUE: Multidetector CT imaging of the chest, abdomen and pelvis was performed following the standard protocol without IV contrast. RADIATION DOSE REDUCTION: This exam was performed according to the departmental dose-optimization program which includes automated exposure control, adjustment of the mA and/or kV according to patient size and/or use of iterative reconstruction technique. COMPARISON:  01/27/2022 FINDINGS: CT CHEST FINDINGS Cardiovascular: Right Port-A-Cath tip high right atrium. Aortic atherosclerosis.  Normal heart size with trace, physiologic pericardial fluid. Three vessel coronary artery calcification. Mediastinum/Nodes: No mediastinal or hilar adenopathy, given limitations of unenhanced CT. Lungs/Pleura: Small right pleural effusion, new. Posterior left upper lobe treated primary measures 1.9 x 3.9 cm in 68/4 versus 2.2 x 3.9 cm on the prior exam when measured in a similar fashion. 1.7 cm craniocaudal on sagittal image 106 today versus 1.8 cm on the prior exam when remeasured in a similar fashion. Left lower lobe calcified granuloma of 3-4 mm. Musculoskeletal: Remote seventh posterolateral left rib fracture.Similar appearance of superior and left-sided T12 sclerotic lesion on 66/5. CT ABDOMEN PELVIS FINDINGS Hepatobiliary: Normal noncontrast appearance of the liver, gallbladder, biliary tract. Pancreas: Fatty replacement involving the pancreatic head and uncinate process. No duct dilatation or acute inflammation. Spleen: Normal in size, without focal abnormality. Adrenals/Urinary Tract: Normal adrenal  glands. Bilateral subcentimeter hypoattenuating renal lesions are likely cysts . In the absence of clinically indicated signs/symptoms require(s) no independent follow-up. No renal calculi or hydronephrosis. No hydroureter or ureteric calculi. No bladder calculi. Stomach/Bowel: Normal stomach, without wall thickening. Normal terminal ileum and appendix. Normal small bowel. Vascular/Lymphatic: Aortic atherosclerosis. No abdominopelvic adenopathy. Reproductive: Normal prostate. Other: No significant free fluid. No free intraperitoneal air. Anasarca. Musculoskeletal: Similar widespread sclerotic osseous metastasis. Example left-sided L5 vertebral body at 2.7 cm on 89/2. mild L2 superior endplate compression deformity is chronic. IMPRESSION: 1. Similar versus slightly decreased size of treated left upper lobe primary bronchogenic carcinoma. 2. Similar osseous metastasis. 3. No evidence of extraosseous metastatic disease. 4. New small right pleural effusion. 5. Coronary artery atherosclerosis. Aortic Atherosclerosis (ICD10-I70.0). Electronically Signed   By: Abigail Miyamoto M.D.   On: 04/28/2022 15:04

## 2022-05-26 NOTE — Assessment & Plan Note (Signed)
Recommend patient to follow-up with neurology.

## 2022-05-26 NOTE — Assessment & Plan Note (Signed)
Recommend Zometa every 4- 6 weeks.-proceed today.  Continue calcium supplementation.  MRI lumbar with and without contrast showed  Stable remote superior endplate fracture of L2 and large Schmorl to noted involving L5. 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 

## 2022-05-26 NOTE — Assessment & Plan Note (Signed)
Immunotherapy plan as listed above 

## 2022-05-26 NOTE — Assessment & Plan Note (Signed)
Hold Eliquis.  Follow-up with Dr. Mickeal Skinner Repeat MRI brain stable.

## 2022-05-26 NOTE — Assessment & Plan Note (Signed)
MRI brain shows persistent microbleeds.   Off Eliquis 2.5mg BID.  

## 2022-05-26 NOTE — Assessment & Plan Note (Signed)
Currently swelling is minimal. BP is low, recommend patient to hold off Lasix.

## 2022-05-27 ENCOUNTER — Telehealth: Payer: Self-pay

## 2022-05-27 LAB — T4: T4, Total: 7.8 ug/dL (ref 4.5–12.0)

## 2022-05-27 NOTE — Telephone Encounter (Signed)
Referral to Dr. Leonie Man has been cancelled. Pt is established with neurology (Dr. Mickeal Skinner) at [facility].   Please schedule and inform pt of appt.   See Dr. Mickeal Skinner

## 2022-05-27 NOTE — Addendum Note (Signed)
Addended by: Evelina Dun on: 05/27/2022 09:40 AM   Modules accepted: Orders

## 2022-05-27 NOTE — Telephone Encounter (Signed)
-----   Message from [name], MD sent at 05/26/2022  8:01 PM EDT ----- Please cancel referral to neurology Dr. Leonie Man.  Patient has been seen by Dr. Mickeal Skinner.  He just needs a follow-up appointment with Dr. Mickeal Skinner.[Apparently he does not remember he has been seen by Dr. Mickeal Skinner before.] Sorry for the inconvenience. Thank you

## 2022-05-28 ENCOUNTER — Encounter: Payer: Self-pay | Admitting: Oncology

## 2022-05-29 ENCOUNTER — Encounter: Payer: Self-pay | Admitting: Hospice and Palliative Medicine

## 2022-05-29 DIAGNOSIS — M47816 Spondylosis without myelopathy or radiculopathy, lumbar region: Secondary | ICD-10-CM | POA: Diagnosis not present

## 2022-05-30 ENCOUNTER — Ambulatory Visit
Admission: RE | Admit: 2022-05-30 | Discharge: 2022-05-30 | Disposition: A | Discharge status: Home / Self Care | Payer: PPO | Source: Ambulatory Visit | Attending: Hospice and Palliative Medicine | Admitting: Hospice and Palliative Medicine

## 2022-05-30 ENCOUNTER — Other Ambulatory Visit: Payer: Self-pay | Admitting: Hospice and Palliative Medicine

## 2022-05-30 ENCOUNTER — Ambulatory Visit
Admission: RE | Admit: 2022-05-30 | Discharge: 2022-05-30 | Disposition: A | Discharge status: Home / Self Care | Payer: PPO | Attending: Hospice and Palliative Medicine | Admitting: Hospice and Palliative Medicine

## 2022-05-30 ENCOUNTER — Inpatient Hospital Stay (HOSPITAL_BASED_OUTPATIENT_CLINIC_OR_DEPARTMENT_OTHER): Payer: PPO | Admitting: Hospice and Palliative Medicine

## 2022-05-30 DIAGNOSIS — C3412 Malignant neoplasm of upper lobe, left bronchus or lung: Secondary | ICD-10-CM | POA: Insufficient documentation

## 2022-05-30 DIAGNOSIS — G893 Neoplasm related pain (acute) (chronic): Secondary | ICD-10-CM

## 2022-05-30 DIAGNOSIS — M25552 Pain in left hip: Secondary | ICD-10-CM | POA: Diagnosis not present

## 2022-05-30 NOTE — Progress Notes (Signed)
Virtual Visit via Telephone Note  I connected with Micheal Donovan on 05/30/22 at  2:00 PM EDT by telephone and verified that I am speaking with the correct person using two identifiers.  Location: Patient: Home Provider: Clinic   I discussed the limitations, risks, security and privacy concerns of performing an evaluation and management service by telephone and the availability of in person appointments. I also discussed with the patient that there may be a patient responsible charge related to this service. The patient expressed understanding and agreed to proceed.   History of Present Illness: Micheal Donovan is a 72 y.o. male with multiple medical problems including non-small cell lung cancer, history of subacute intraparenchymal hemorrhage, PE.  Patient is status post XRT.  He is on systemic chemo.  He is referred to palliative care to address goals and manage ongoing symptoms.    Observations/Objective: I spoke with patient and wife by phone.   Patient with known skeletal metastases with previous XRT to the right hip with improvement to his pain.  Patient states that he is now having similar pain in the left hip.  He reports several falls tell me that his legs "give out".  His wife says that he often refuses to use his walker.  Patient denies having fallen directly on the hip.  However, will send for x-ray to rule out fracture.  Discussed case with IR and patient does have PET + lesion to the left ilium, which could be the primary culprit for patient's described pain.  Recent CT correlate documented on 04/28/2022.  Patient could be a candidate for RFA but he is interested in speaking with Dr. Baruch Gouty to discuss XRT.  In interim, we will continue current pain regimen.   Discussed fall prevention strategies.  Recommended consistent utilization of his walker.  Will refer to [facility] for rehab screening.   Assessment and Plan: Neoplasm related pain -continue fentanyl/Percocet.  Will send for  x-ray to rule out pathologic fracture. Referral to radiation oncology.   Follow Up Instructions: Follow-up telephone visit 1 month   I discussed the assessment and treatment plan with the patient. The patient was provided an opportunity to ask questions and all were answered. The patient agreed with the plan and demonstrated an understanding of the instructions.   The patient was advised to call back or seek an in-person evaluation if the symptoms worsen or if the condition fails to improve as anticipated.  I provided 10 minutes of non-face-to-face time during this encounter.   Irean Hong, NP

## 2022-06-02 ENCOUNTER — Other Ambulatory Visit: Payer: Self-pay | Admitting: *Deleted

## 2022-06-02 DIAGNOSIS — G893 Neoplasm related pain (acute) (chronic): Secondary | ICD-10-CM

## 2022-06-02 DIAGNOSIS — H2512 Age-related nuclear cataract, left eye: Secondary | ICD-10-CM | POA: Diagnosis not present

## 2022-06-02 DIAGNOSIS — Z961 Presence of intraocular lens: Secondary | ICD-10-CM | POA: Diagnosis not present

## 2022-06-02 DIAGNOSIS — C3412 Malignant neoplasm of upper lobe, left bronchus or lung: Secondary | ICD-10-CM

## 2022-06-03 DIAGNOSIS — H2511 Age-related nuclear cataract, right eye: Secondary | ICD-10-CM | POA: Diagnosis not present

## 2022-06-09 ENCOUNTER — Other Ambulatory Visit: Payer: Self-pay

## 2022-06-09 MED ORDER — TAMSULOSIN HCL 0.4 MG PO CAPS: ORAL_CAPSULE | ORAL | 0 refills | Status: DC

## 2022-06-10 ENCOUNTER — Other Ambulatory Visit: Payer: Self-pay | Admitting: *Deleted

## 2022-06-10 ENCOUNTER — Encounter: Payer: Self-pay | Admitting: Radiation Oncology

## 2022-06-10 ENCOUNTER — Ambulatory Visit
Admission: RE | Admit: 2022-06-10 | Discharge: 2022-06-10 | Disposition: A | Discharge status: Home / Self Care | Payer: PPO | Source: Ambulatory Visit | Attending: Radiation Oncology | Admitting: Radiation Oncology

## 2022-06-10 VITALS — BP 91/61 | HR 70 | Temp 97.2°F | Resp 16 | Wt 145.0 lb

## 2022-06-10 DIAGNOSIS — C3412 Malignant neoplasm of upper lobe, left bronchus or lung: Secondary | ICD-10-CM | POA: Diagnosis not present

## 2022-06-10 DIAGNOSIS — C7951 Secondary malignant neoplasm of bone: Secondary | ICD-10-CM

## 2022-06-10 DIAGNOSIS — Z923 Personal history of irradiation: Secondary | ICD-10-CM | POA: Diagnosis not present

## 2022-06-10 NOTE — Progress Notes (Signed)
Radiation Oncology Follow up Note  Name: Micheal Donovan   Date:   06/10/2022 MRN:  [id number] DOB: [birth year]    This 72 y.o. male presents to the clinic today for evaluation.  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  He has a history of stage IV adenocarcinoma the left lung previously treated to his L-spine and SI joints.  He has been under the care of of symptom management and is seen today for consideration of further palliative treatment..  Recent plain films of his left hip shows no acute displaced fracture.  MRI of his lumbar spine back in February shows superior endplate fracture of L2 and enlarging synovial cyst at L4-5.  Patient excellent pain relief from his prior radiation therapy to his lumbar spine SI joints  COMPLICATIONS OF TREATMENT: none  FOLLOW UP COMPLIANCE: keeps appointments   PHYSICAL EXAM:  BP 91/61   Pulse 70   Temp (!) 97.2 F (36.2 C) (Tympanic)   Resp 16   Wt 145 lb (65.8 kg)   BMI 22.71 kg/m  Range of motion of his lower extremities does not elicit pain.  Motor and sensory levels are equal and symmetric in the lower extremities.  Deep palpation of his lumbar spine does not elicit pain.  Well-developed well-nourished patient in NAD. HEENT reveals PERLA, EOMI, discs not visualized.  Oral cavity is clear. No oral mucosal lesions are identified. Neck is clear without evidence of cervical or supraclavicular adenopathy. Lungs are clear to A&P. Cardiac examination is essentially unremarkable with regular rate and rhythm without murmur rub or thrill. Abdomen is benign with no organomegaly or masses noted. Motor sensory  and DTR levels are equal and symmetric in the upper and lower extremities. Cranial nerves II through XII are grossly intact. Proprioception is intact. No peripheral adenopathy or edema is identified. No motor or sensory levels are noted. Crude visual fields are within normal range.  RADIOLOGY RESULTS: MRI scans plain films reviewed PET CT scan ordered  PLAN: At this time of ordered a PET CT scan to better delineate possible metastatic disease causing his left hip pain.  Based on those findings may offer further palliative treatment at that time.  I have set up the PET scan with the follow-ups right after that for discussion.  Patient comprehends my recommendations well.  I would like to take this opportunity to thank you for allowing me to participate in the care of your patient.[name], MD

## 2022-06-13 ENCOUNTER — Ambulatory Visit: Payer: PPO | Admitting: Internal Medicine

## 2022-06-16 ENCOUNTER — Encounter: Payer: Self-pay | Admitting: Oncology

## 2022-06-16 ENCOUNTER — Other Ambulatory Visit: Payer: PPO

## 2022-06-16 ENCOUNTER — Inpatient Hospital Stay: Payer: PPO | Attending: Radiation Oncology

## 2022-06-16 ENCOUNTER — Ambulatory Visit: Payer: PPO

## 2022-06-16 ENCOUNTER — Inpatient Hospital Stay (HOSPITAL_BASED_OUTPATIENT_CLINIC_OR_DEPARTMENT_OTHER): Payer: PPO | Admitting: Hospice and Palliative Medicine

## 2022-06-16 ENCOUNTER — Inpatient Hospital Stay: Payer: PPO

## 2022-06-16 ENCOUNTER — Inpatient Hospital Stay (HOSPITAL_BASED_OUTPATIENT_CLINIC_OR_DEPARTMENT_OTHER): Payer: PPO | Admitting: Oncology

## 2022-06-16 ENCOUNTER — Ambulatory Visit: Payer: PPO | Admitting: Oncology

## 2022-06-16 VITALS — BP 97/51 | HR 77 | Temp 96.7°F | Resp 18 | Wt 143.2 lb

## 2022-06-16 DIAGNOSIS — Z85820 Personal history of malignant melanoma of skin: Secondary | ICD-10-CM | POA: Insufficient documentation

## 2022-06-16 DIAGNOSIS — C7931 Secondary malignant neoplasm of brain: Secondary | ICD-10-CM | POA: Insufficient documentation

## 2022-06-16 DIAGNOSIS — E119 Type 2 diabetes mellitus without complications: Secondary | ICD-10-CM | POA: Diagnosis not present

## 2022-06-16 DIAGNOSIS — C3412 Malignant neoplasm of upper lobe, left bronchus or lung: Secondary | ICD-10-CM

## 2022-06-16 DIAGNOSIS — I3139 Other pericardial effusion (noninflammatory): Secondary | ICD-10-CM | POA: Diagnosis not present

## 2022-06-16 DIAGNOSIS — M7989 Other specified soft tissue disorders: Secondary | ICD-10-CM | POA: Diagnosis not present

## 2022-06-16 DIAGNOSIS — I619 Nontraumatic intracerebral hemorrhage, unspecified: Secondary | ICD-10-CM

## 2022-06-16 DIAGNOSIS — C7951 Secondary malignant neoplasm of bone: Secondary | ICD-10-CM | POA: Insufficient documentation

## 2022-06-16 DIAGNOSIS — E785 Hyperlipidemia, unspecified: Secondary | ICD-10-CM | POA: Insufficient documentation

## 2022-06-16 DIAGNOSIS — Z5112 Encounter for antineoplastic immunotherapy: Secondary | ICD-10-CM | POA: Diagnosis not present

## 2022-06-16 DIAGNOSIS — I2699 Other pulmonary embolism without acute cor pulmonale: Secondary | ICD-10-CM

## 2022-06-16 DIAGNOSIS — G893 Neoplasm related pain (acute) (chronic): Secondary | ICD-10-CM | POA: Diagnosis not present

## 2022-06-16 DIAGNOSIS — J910 Malignant pleural effusion: Secondary | ICD-10-CM | POA: Diagnosis not present

## 2022-06-16 DIAGNOSIS — I509 Heart failure, unspecified: Secondary | ICD-10-CM | POA: Insufficient documentation

## 2022-06-16 DIAGNOSIS — E861 Hypovolemia: Secondary | ICD-10-CM | POA: Diagnosis not present

## 2022-06-16 DIAGNOSIS — K219 Gastro-esophageal reflux disease without esophagitis: Secondary | ICD-10-CM | POA: Diagnosis not present

## 2022-06-16 DIAGNOSIS — N400 Enlarged prostate without lower urinary tract symptoms: Secondary | ICD-10-CM | POA: Insufficient documentation

## 2022-06-16 DIAGNOSIS — I110 Hypertensive heart disease with heart failure: Secondary | ICD-10-CM | POA: Insufficient documentation

## 2022-06-16 DIAGNOSIS — M79605 Pain in left leg: Secondary | ICD-10-CM | POA: Insufficient documentation

## 2022-06-16 DIAGNOSIS — I700 Atherosclerosis of aorta: Secondary | ICD-10-CM | POA: Diagnosis not present

## 2022-06-16 LAB — COMPREHENSIVE METABOLIC PANEL
ALT: 10 U/L (ref 0–44)
AST: 20 U/L (ref 15–41)
Albumin: 3.5 g/dL (ref 3.5–5.0)
Alkaline Phosphatase: 64 U/L (ref 38–126)
Anion gap: 8 (ref 5–15)
BUN: 19 mg/dL (ref 8–23)
CO2: 25 mmol/L (ref 22–32)
Calcium: 8.9 mg/dL (ref 8.9–10.3)
Chloride: 100 mmol/L (ref 98–111)
Creatinine, Ser: 1.42 mg/dL — ABNORMAL HIGH (ref 0.61–1.24)
GFR, Estimated: 53 mL/min — ABNORMAL LOW (ref >60)
Glucose, Bld: 117 mg/dL — ABNORMAL HIGH (ref 70–99)
Potassium: 3.6 mmol/L (ref 3.5–5.1)
Sodium: 133 mmol/L — ABNORMAL LOW (ref 135–145)
Total Bilirubin: 0.3 mg/dL (ref 0.3–1.2)
Total Protein: 6.7 g/dL (ref 6.5–8.1)

## 2022-06-16 LAB — CBC WITH DIFFERENTIAL/PLATELET
Abs Immature Granulocytes: 0.01 10*3/uL (ref 0.00–0.07)
Basophils Absolute: 0 10*3/uL (ref 0.0–0.1)
Basophils Relative: 1 %
Eosinophils Absolute: 0.4 10*3/uL (ref 0.0–0.5)
Eosinophils Relative: 6 %
HCT: 31.6 % — ABNORMAL LOW (ref 39.0–52.0)
Hemoglobin: 10.2 g/dL — ABNORMAL LOW (ref 13.0–17.0)
Immature Granulocytes: 0 %
Lymphocytes Relative: 15 %
Lymphs Abs: 0.9 10*3/uL (ref 0.7–4.0)
MCH: 30.9 pg (ref 26.0–34.0)
MCHC: 32.3 g/dL (ref 30.0–36.0)
MCV: 95.8 fL (ref 80.0–100.0)
Monocytes Absolute: 0.4 10*3/uL (ref 0.1–1.0)
Monocytes Relative: 8 %
Neutro Abs: 4.1 10*3/uL (ref 1.7–7.7)
Neutrophils Relative %: 70 %
Platelets: 241 10*3/uL (ref 150–400)
RBC: 3.3 MIL/uL — ABNORMAL LOW (ref 4.22–5.81)
RDW: 12.5 % (ref 11.5–15.5)
WBC: 5.8 10*3/uL (ref 4.0–10.5)
nRBC: 0 % (ref 0.0–0.2)

## 2022-06-16 MED ORDER — HEPARIN SOD (PORK) LOCK FLUSH 100 UNIT/ML IV SOLN
INTRAVENOUS | Status: AC
  Administered 2022-06-16: 500 [IU]
  Filled 2022-06-16: qty 5

## 2022-06-16 MED ORDER — CYANOCOBALAMIN 1000 MCG/ML IJ SOLN: 1000.0000 ug | Freq: Once | INTRAMUSCULAR | Status: DC

## 2022-06-16 MED ORDER — ALPRAZOLAM 0.5 MG PO TABS: 0.5000 mg | ORAL_TABLET | ORAL | 0 refills | Status: DC

## 2022-06-16 MED ORDER — ZOLEDRONIC ACID 4 MG/100ML IV SOLN
4.0000 mg | Freq: Once | INTRAVENOUS | Status: AC
  Administered 2022-06-16: 4 mg via INTRAVENOUS
  Filled 2022-06-16: qty 100

## 2022-06-16 MED ORDER — FENTANYL 75 MCG/HR TD PT72: 1.0000 | MEDICATED_PATCH | TRANSDERMAL | 0 refills | Status: DC

## 2022-06-16 MED ORDER — OXYCODONE-ACETAMINOPHEN 7.5-325 MG PO TABS: 1.0000 | ORAL_TABLET | ORAL | 0 refills | Status: DC | PRN

## 2022-06-16 MED ORDER — SODIUM CHLORIDE 0.9 % IV SOLN
Freq: Once | INTRAVENOUS | Status: AC
  Filled 2022-06-16: qty 250

## 2022-06-16 MED ORDER — ZOLEDRONIC ACID 4 MG/100ML IV SOLN: 4.0000 mg | Freq: Once | INTRAVENOUS | Status: DC

## 2022-06-16 MED ORDER — SODIUM CHLORIDE 0.9 % IV SOLN
200.0000 mg | Freq: Once | INTRAVENOUS | Status: AC
  Administered 2022-06-16: 200 mg via INTRAVENOUS
  Filled 2022-06-16: qty 8

## 2022-06-16 MED ORDER — HEPARIN SOD (PORK) LOCK FLUSH 100 UNIT/ML IV SOLN
500.0000 [IU] | Freq: Once | INTRAVENOUS | Status: AC | PRN
  Filled 2022-06-16: qty 5

## 2022-06-16 NOTE — Progress Notes (Signed)
Palliative Medicine [facility] [phone] [phone]   Name: Micheal Donovan Date: 06/16/2022 MRN: [id number]  DOB: 08-16-1950  Patient Care Team: Donita Brooks, MD as PCP - General (Family Medicine) Wyline Mood Alben Spittle, MD as PCP - Cardiology (Cardiology) Glory Buff, RN as Oncology Nurse Navigator [name], MD as Consulting Physician (Oncology) Maisie Fus, MD as Consulting Physician (Cardiology) Blair Promise, OD (Optometry) Sheran Luz, MD as Consulting Physician (Physical Medicine and Rehabilitation)    REASON FOR CONSULTATION: Micheal Donovan is a 72 y.o. male with multiple medical problems including non-small cell lung cancer, history of subacute intraparenchymal hemorrhage, PE.  Patient is status post XRT.  He is on systemic chemo.  He is referred to palliative care to address goals and manage ongoing symptoms.  SOCIAL HISTORY:     reports that he quit smoking about 31 years ago. His smoking use included cigarettes. He has never used smokeless tobacco. He reports that he does not drink alcohol and does not use drugs.  Patient is married.  He has a daughter who is his primary caregiver.  Patient previously worked as an Lexicographer  ADVANCE DIRECTIVES:  On file  CODE STATUS:   PAST MEDICAL HISTORY: Past Medical History:  Diagnosis Date   Allergy    BPH (benign prostatic hypertrophy)    Cancer    Melanoma on Neck    2008   Carotid artery occlusion    CHF (congestive heart failure)    Diabetes mellitus    type 2   ED (erectile dysfunction)    GERD (gastroesophageal reflux disease)    Hemorrhagic stroke 06/2021   Hyperlipidemia    Hypertension    Lung cancer    Neoplasm related pain    Retinopathy due to secondary DM     PAST SURGICAL HISTORY:  Past Surgical History:  Procedure Laterality Date   BRONCHIAL NEEDLE ASPIRATION BIOPSY  06/17/2021   Procedure: BRONCHIAL NEEDLE  ASPIRATION BIOPSIES;  Surgeon: Lorin Glass, MD;  Location: [facility] ENDOSCOPY;  Service: Pulmonary;;   IR IMAGING GUIDED PORT INSERTION  08/05/2021   MELANOMA EXCISION  2008   Left side of neck   RADIOLOGY WITH ANESTHESIA N/A 04/05/2020   Procedure: MRI SPINE WITOUT CONTRAST;  Surgeon: Radiologist, Medication, MD;  Location: [facility] OR;  Service: Radiology;  Laterality: N/A;   RADIOLOGY WITH ANESTHESIA N/A 08/22/2021   Procedure: MRI BRAIN WITH AND WITHOUT CONTRAST  WITH ANESTHESIA;  Surgeon: Radiologist, Medication, MD;  Location: [facility] OR;  Service: Radiology;  Laterality: N/A;   RADIOLOGY WITH ANESTHESIA N/A 12/17/2021   Procedure: MRI BRAIN WITH AND WITHOUT CONTRAST WITH ANESTHESIA; MRI LUMBER WITH AND WITHOUT CONTRAST;  Surgeon: Radiologist, Medication, MD;  Location: [facility] OR;  Service: Radiology;  Laterality: N/A;   RADIOLOGY WITH ANESTHESIA N/A 04/08/2022   Procedure: MRI LUMBER SPINE WITH AND WITHOUT CONTRAST;  Surgeon: Radiologist, Medication, MD;  Location: [facility] OR;  Service: Radiology;  Laterality: N/A;   TONSILLECTOMY     VIDEO BRONCHOSCOPY WITH ENDOBRONCHIAL ULTRASOUND N/A 06/17/2021   Procedure: VIDEO BRONCHOSCOPY WITH ENDOBRONCHIAL ULTRASOUND;  Surgeon: Lorin Glass, MD;  Location: [facility] ENDOSCOPY;  Service: Pulmonary;  Laterality: N/A;    HEMATOLOGY/ONCOLOGY HISTORY:  Oncology History Overview Note  Diagnosis: Stage IIB T2b N1 M0 adenocarcinoma of the LUL, poorly differentiated     Primary non-small cell carcinoma of upper lobe of left lung  06/13/2021 Initial Diagnosis   Primary non-small cell carcinoma of  upper lobe of left lung [facility]) -06/20/2021 - 06/27/2021, patient presented to [facility] due to progressive headache/dizziness/gait changes. CT head showed acute to subacute intraparenchymal hemorrhage involving the left cerebellum.  Surrounding low-density vasogenic edema.  Patient was transferred to [facility]. This was further evaluated by CT angiogram of the neck which showed bulky  calcified plaques/stenosis of carotid artery, Followed by MRI brain. 06/12/2021, MRI of the brain showed no significant interval change in size of the left cerebellar intraparenchymal hematoma with unchanged regional mass effect and partial effacement of fourth ventricle but no upstream hydrocephalus. There is no discernible enhancement to suggest underlying mass lesion, though acute blood products could mask enhancement.   06/12/2021 a chest x-ray showed a 4.4 cm left middle lobe. 06/13/2021, CT chest with contrast showed a 4.3 x 3.6 x 3.3 cm lobular spiculated mass in the posterior left upper lobe with T3 to the lateral pleura and major fissure.  Metastatic left hilar lymphadenopathy.  Peripheral micronodularity posterior right costophrenic sulcus.  Aortic atherosclerosis. 06/14/2021, CT abdomen pelvis showed right lower lobe pulmonary artery embolus.  No evidence of right heart strain.  No acute intra-abdominal or pelvic pathology.  Aortic atherosclerosis.  06/13/2021, patient underwent bronchoscopy with EBUS by Dr. Katrinka Blazing.  Biopsy from the fine-needle aspiration of station 11 mL showed malignant cells, consistent with poorly differentiated non-small cell carcinoma, consistent with adenocarcinoma.  Malignant cells are TTF-1 positive and negative for p40.  Negative for neuroendocrine markers.  Blood test and tissue sample were tested for Gardant 360  -PD-L1 TPS 97%, no actionable mutation on the blood testing. 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   07/17/2021 Cancer Staging   Staging form: Lung, AJCC 8th Edition - Clinical: Stage IV (cT2b, cN1, cM1) - Signed by [name], MD on 08/06/2021   08/06/2021 Imaging   I saw patient's PET scan after his visit with me on 08/06/21. PET scan was ordered by his previous oncologist group and did not come to my in basket.. Patient received cycle 1 carboplatin Taxol today.  He has started on radiation.  5/26/3 PET scan showed 3.8 cm hypermetabolic left  upper lobe mass with hypermetabolic left hilar and infrahilar adenopathy.  There is approximately 8 scattered metastatic lesions in the skeleton. Mixed density photopenic lesion in the left cerebellum. characterized as hemorrhage on recent prior imaging workups.    08/16/2021 -  Chemotherapy   Patient is on Treatment Plan : LUNG Carboplatin (5) + Pemetrexed + Pembrolizumab (200) D1 q21d Induction x 4 cycles / Maintenance Pemetrexed + Pembrolizumab (200) D1 q21d      08/22/2021 Imaging   MRI brain w wo contrast  Decrease in size of left cerebellar hematoma with resolution of edema. No evidence of underlying lesion. Smaller, more recent hemorrhage in the left cerebellar vermis with minimal edema. There is minimal enhancement without definite evidence of underlying lesion.  New punctate focus of chronic blood products and enhancement in the left frontal lobe. Additional new foci of chronic blood products in the posterior right putamen, right parietal subcortical white matter, and posterior right cerebellum. Unclear at this time if these represent foci of bland hemorrhage or early metastases.   Increase in size of right parietal osseous metastasis with minor extraosseous extension.     Imaging   PET scan showed 1. Interval response to therapy as evidenced by a small residual left upper lobe nodule with decreased hypermetabolism, no residual hypermetabolic adenopathy and decreased hypermetabolism associatedwith osseous metastases. 2. 6 mm posterior  left upper lobe nodule, likely stable. Recommend attention on follow-up. 3. Aortic atherosclerosis (ICD10-I70.0). Coronary artery calcification.   12/17/2021 Imaging   MRI lumbar spine w wo contrast  1. No evidence of regional metastatic disease. 2. Late subacute superior endplate fracture at L2 and large superior endplate Schmorl's node at L5, unchanged since the PET scan of 10/29/2021. 3. L3-4: Shallow disc protrusion. Facet and ligamentous  hypertrophy. Stenosis of both lateral recesses. Findings slightly worsened since 2022. 4. L4-5: Shallow disc protrusion. Facet and ligamentous hypertrophy. Small synovial cyst arising from the facet joint on the right. Stenosis of the lateral recesses right worse than left. Foraminal narrowing right worse than left. Findings have worsened since 2022.  5. L5-S1: Endplate osteophytes and shallow protrusion of the disc. Facet and ligamentous hypertrophy. Stenosis of the subarticular lateral recesses and neural foramina, right worse than left. Similar appearance to the study of 2022. 6. Continued evidence of enteritis of at least 1 loop of small bowel. Distended bladder present   12/18/2021 Imaging   Brian MRI w wo  1. Interval decrease in size of the left cerebellar and vermian hematomas. No definite evidence of underlying metastatic lesion. 2. There is are two possible contrast enhancing lesions in the posterior left frontal lobe and left occipital lobe, which do not have intrinsic T1 signal abnormality, but have a somewhat linear appearance and may be vascular in nature. Recommend attention on follow up. 3. Multifocal sites of susceptibility artifact are redemonstrated with interval development of a few new foci, as described above. All of these sites demonstrate intrinsic T1 hyperintense signal abnormality and susceptibility artifact and are compatible with sites of microhemorrhages. Recommend continued attention on follow up. 4. Interval decrease in size of right parietal calvarial metastatic lesion. No new contrast enhancing lesions visualized. 5. Diffusely heterogeneous marrow signal throughout the cervical spine, which is nonspecific but can be seen in the setting of anemia, smoking, obesity, or a marrow replacement process.     12/18/2021 Imaging   MRI lumbar spine w wo contrast  1. No evidence of regional metastatic disease. 2. Late subacute superior endplate fracture at L2 and large superior  endplate Schmorl's node at L5, unchanged since the PET scan of 10/29/2021 3. L3-4: Shallow disc protrusion. Facet and ligamentous hypertrophy.Stenosis of both lateral recesses. Findings slightly worsened since 2022. 4. L4-5: Shallow disc protrusion. Facet and ligamentous hypertrophy. Small synovial cyst arising from the facet joint on the right. Stenosis of the lateral recesses right worse than left. Foraminal narrowing right worse than left. Findings have worsened since 2022. 5. L5-S1: Endplate osteophytes and shallow protrusion of the disc. Facet and ligamentous hypertrophy. Stenosis of the subarticular lateral recesses and neural foramina, right worse than left. Similar appearance to the study of 2022. 6. Continued evidence of enteritis of at least 1 loop of small bowel. Distended bladder present.       01/28/2022 Imaging   CT chest abdomen pelvis w contrast 1. Treated left upper lobe mass appears grossly stable to the prior examination when measured in a similar fashion on the prior study. No definite signs of extra skeletal metastatic disease noted in the chest, abdomen or pelvis. 2. Widespread skeletal metastases redemonstrated, as above. 3. Hepatic steatosis. 4. Aortic atherosclerosis, in addition to left main and three-vessel coronary artery disease. Assessment for potential risk factor modification, dietary therapy or pharmacologic therapy may be warranted, if clinically indicated. 5. Additional incidental findings,    04/08/2022 Imaging   MRI lumbar spine w wo contrast  1. Stable remote superior endplate fracture of L2 and large Schmorl to noted involving L5. No worrisome bone lesions to suggest metastatic disease. 2. Degenerative lumbar spondylosis with multilevel disc disease and facet disease which appears relatively stable as detailed above. 3. Enlarging right-sided synovial cyst at L4-5 with progressive mass effect on the right side of the thecal sac. This could potentially be  symptomatic   04/28/2022 Imaging   CT chest abdomen pelvis wo contrast  1. Similar versus slightly decreased size of treated left upper lobe primary bronchogenic carcinoma. 2. Similar osseous metastasis. 3. No evidence of extraosseous metastatic disease. 4. New small right pleural effusion. 5. Coronary artery atherosclerosis. Aortic Atherosclerosis     ALLERGIES:  is allergic to niaspan [niacin].  MEDICATIONS:  Current Outpatient Medications  Medication Sig Dispense Refill   ALPRAZolam (XANAX) 0.5 MG tablet Take 1 tablet (0.5 mg total) by mouth See admin instructions. Take 1 tablet 30 minutes prior to imaging procedure, may take another tablet if needed. 2 tablet 0   aspirin EC 81 MG tablet Take 1 tablet (81 mg total) by mouth daily. Swallow whole. 30 tablet 12   atorvastatin (LIPITOR) 40 MG tablet Take 1 tablet (40 mg total) by mouth daily. 60 tablet 2   Ca Phosphate-Cholecalciferol (CALCIUM WITH D3 PO) Take 1 capsule by mouth daily.     fentaNYL (DURAGESIC) 75 MCG/HR Place 1 patch onto the skin every 3 (three) days. 10 patch 0   folic acid (FOLVITE) 800 MCG tablet Take 1 tablet (800 mcg total) by mouth daily. 60 tablet 1   furosemide (LASIX) 20 MG tablet Take 1 tablet (20 mg total) by mouth daily as needed (leg swelling or sob). 30 tablet 3   gabapentin (NEURONTIN) 300 MG capsule Take 1 capsule (300 mg total) by mouth 3 (three) times daily. 90 capsule 1   insulin glargine (LANTUS) 100 UNIT/ML injection Inject 30 Units into the skin daily as needed. Only takes Lantus when sugar is above 150.     neomycin-polymyxin b-dexamethasone (MAXITROL) 3.5-10000-0.1 SUSP 1 drop 3 (three) times daily.     NOVOLOG FLEXPEN 100 UNIT/ML FlexPen Inject 0-10 Units into the skin daily as needed for high blood sugar (above 200). Sliding scale     omeprazole (PRILOSEC) 20 MG capsule Take 20 mg by mouth daily.     oxyCODONE-acetaminophen (PERCOCET) 7.5-325 MG tablet Take 1 tablet by mouth every 4 (four) hours  as needed for severe pain. Do not take with cough medication or other pain medication 60 tablet 0   Probiotic Product (PROBIOTIC DAILY PO) Take by mouth. Physician's Choice Probiotic     tamsulosin (FLOMAX) 0.4 MG CAPS capsule TAKE 1 CAPSULE(0.4 MG) BY MOUTH DAILY 30 capsule 0   traZODone (DESYREL) 50 MG tablet Take 1 tablet (50 mg total) by mouth at bedtime as needed for sleep. 30 tablet 3   UNABLE TO FIND PLEASE CHECK FASTING BLOOD GLUCOSE EVERY MORNING 1 each 0   valsartan (DIOVAN) 80 MG tablet Take 1 tablet (80 mg total) by mouth daily. 90 tablet 3   No current facility-administered medications for this visit.   Facility-Administered Medications Ordered in Other Visits  Medication Dose Route Frequency Provider Last Rate Last Admin   cyanocobalamin (VITAMIN B12) injection 1,000 mcg  1,000 mcg Intramuscular Once [name], MD       heparin lock flush 100 UNIT/ML injection            heparin lock flush 100 unit/mL  500 Units Intracatheter  Once PRN [name], MD       pembrolizumab [facility]) 200 mg in sodium chloride 0.9 % 50 mL chemo infusion  200 mg Intravenous Once [name], MD 116 mL/hr at 06/16/22 1413 200 mg at 06/16/22 1413   Zoledronic Acid (ZOMETA) IVPB 4 mg  4 mg Intravenous Once [name], MD        VITAL SIGNS: There were no vitals taken for this visit. There were no vitals filed for this visit.   Estimated body mass index is 22.43 kg/m as calculated from the following:   Height as of 04/21/22: 5\' 7"  (1.702 m).   Weight as of an earlier encounter on 06/16/22: 143 lb 3.2 oz (65 kg).  LABS: CBC:    Component Value Date/Time   WBC 5.8 06/16/2022 1255   HGB 10.2 (L) 06/16/2022 1255   HGB 13.4 07/17/2021 1345   HCT 31.6 (L) 06/16/2022 1255   PLT 241 06/16/2022 1255   PLT 373 07/17/2021 1345   MCV 95.8 06/16/2022 1255   NEUTROABS 4.1 06/16/2022 1255   LYMPHSABS 0.9 06/16/2022 1255   MONOABS 0.4 06/16/2022 1255   EOSABS 0.4 06/16/2022 1255   BASOSABS 0.0 06/16/2022 1255    Comprehensive Metabolic Panel:    Component Value Date/Time   NA 133 (L) 06/16/2022 1255   K 3.6 06/16/2022 1255   CL 100 06/16/2022 1255   CO2 25 06/16/2022 1255   BUN 19 06/16/2022 1255   CREATININE 1.42 (H) 06/16/2022 1255   CREATININE 1.07 02/11/2022 1457   GLUCOSE 117 (H) 06/16/2022 1255   CALCIUM 8.9 06/16/2022 1255   AST 20 06/16/2022 1255   AST 18 07/17/2021 1345   ALT 10 06/16/2022 1255   ALT 20 07/17/2021 1345   ALKPHOS 64 06/16/2022 1255   BILITOT 0.3 06/16/2022 1255   BILITOT 0.6 07/17/2021 1345   PROT 6.7 06/16/2022 1255   ALBUMIN 3.5 06/16/2022 1255    RADIOGRAPHIC STUDIES: DG HIP UNILAT WITH PELVIS 2-3 VIEWS LEFT  Result Date: 05/30/2022 CLINICAL DATA:  Left hip pain, recent fall, history of non-small cell left upper lobe lung cancer EXAM: DG HIP (WITH OR WITHOUT PELVIS) 2-3V LEFT COMPARISON:  PET scan 10/29/2021 FINDINGS: Frontal view of the pelvis as well as frontal and frogleg lateral views of the left hip are obtained. There are no acute displaced fractures. Joint spaces are well preserved. The hypermetabolic lytic metastases seen within the bilateral iliac bones and lower lumbar spine on prior PET CT are difficult to appreciate on radiograph. Slight asymmetric sclerosis in the right iliac bone near the anterior superior iliac spine could reflect reparative changes at the site of known metastasis. IMPRESSION: 1. No acute displaced fractures. Specifically, no abnormality of the left hip is identified. 2. The known lytic bony metastases seen on prior PET scans are not readily apparent by x-ray. Electronically Signed   By: Sharlet Salina M.D.   On: 05/30/2022 16:50    PERFORMANCE STATUS (ECOG) : 2 - Symptomatic, <50% confined to bed  Review of Systems Unless otherwise noted, a complete review of systems is negative.  Physical Exam General: NAD Pulmonary: Unlabored Extremities: no edema, no joint deformities Skin: no rashes Neurological: Weakness but otherwise  nonfocal  IMPRESSION: Patient seen in infusion.  Patient endorses ongoing left hip pain with weakness.  He was evaluated by Dr. Rushie Chestnut with plan for repeat PET/CT imaging.  Additionally, patient is being followed by physiatry with previous discussion for possible therapeutic nerve block.  I have also  previously discussed his case with IR given previous PET positive lesion of the left ilium.  RFA might also be a consideration but will await results of PET/CT for further recommendations.  Continue current opioid regimen with fentanyl/Percocet.  PLAN: -Continue current scope of treatment -Continue fentanyl/Percocet -Patient pending PET -Follow-up telephone visit 3 to 4 weeks  Patient expressed understanding and was in agreement with this plan. He also understands that He can call the clinic at any time with any questions, concerns, or complaints.   Time Total: 15 minutes  Visit consisted of counseling and education dealing with the complex and emotionally intense issues of symptom management and palliative care in the setting of serious and potentially life-threatening illness.Greater than 50%  of this time was spent counseling and coordinating care related to the above assessment and plan.  Signed by: Laurette Schimke, [name], NP-C

## 2022-06-16 NOTE — Assessment & Plan Note (Signed)
normal cortisol and ACTH  Continue to monitor.  Recommend patient to continue hold off BP meds. 

## 2022-06-16 NOTE — Assessment & Plan Note (Signed)
Immunotherapy plan as listed above 

## 2022-06-16 NOTE — Assessment & Plan Note (Addendum)
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  PET scan images were reviewed with patient.- good partial response.  Hold maintenance Alimta/Keytruda  -dose reduce Alimta 350mg /m2, Alimta was discontinued due to poor PS Labs are reviewed and discussed with patient. Proceed with Keytruda CT chest abdomen pelvis wo contrast- stable disease PET 06/26/22

## 2022-06-16 NOTE — Progress Notes (Addendum)
Hematology/Oncology Progress note [phone] Fax:([phone]) [phone]       CHIEF COMPLAINTS/REASON FOR VISIT:  Metastatic non-small cell lung cancer  ASSESSMENT & PLAN:   Cancer Staging  Primary non-small cell carcinoma of upper lobe of left lung Staging form: Lung, AJCC 8th Edition - Clinical: Stage IV (cT2b, cN1, cM1) - Signed by [name], MD on 08/06/2021   Primary non-small cell carcinoma of upper lobe of left lung (HCC) Stage IV lung adenocarcinoma with brain and bone metastasis.  PET scan images were reviewed with patient.- good partial response.  Hold maintenance Alimta/Keytruda  -dose reduce Alimta 350mg /m2, Alimta was discontinued due to poor PS Labs are reviewed and discussed with patient. Proceed with Keytruda CT chest abdomen pelvis wo contrast- stable disease PET 06/26/22  Intraparenchymal hemorrhage of brain (HCC) Hold Eliquis.  Follow-up with Dr. Barbaraann Cao Repeat MRI brain-stable findings  Pulmonary embolus [facility]) Previous MRI brain shows persistent microbleeds.   Off Eliquis 2.5mg  BID.   Metastasis to bone (HCC) Recommend Zometa every 4- 6 weeks.-proceed today.  Continue calcium supplementation.  MRI lumbar with and without contrast showed  Stable remote superior endplate fracture of L2 and large Schmorl to noted involving L5. 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 Pending PET evaluation to see if any area could be further treated with palliative RT  Neoplasm related pain Back pain, MRI lumbar showed compression fracture Continue PRN percocet 7.5mg /325mg , fentanyl patch Q72 hours MRI lumbar showed DJD, L4-L5 synovial cyst, no new bone mets.  Patient gets epidural steroid injections.    Swelling of lower extremity Currently swelling is minimal. BP is low, continue  hold off Lasix.  Hypotension normal cortisol and ACTH  Continue to monitor.  Recommend patient to  continue hold off BP meds.  Encounter for antineoplastic immunotherapy Immunotherapy plan as listed above    Follow up 3 weeks lab MD Keytruda. All questions were answered. The patient knows to call the clinic with any problems, questions or concerns.  [name], MD, [name] [facility] Hematology Oncology 06/16/2022      HISTORY OF PRESENTING ILLNESS:   Micheal Donovan is a  72 y.o.  male presents for follow up of Non-small cell lung cancer.  Oncology History Overview Note  Diagnosis: Stage IIB T2b N1 M0 adenocarcinoma of the LUL, poorly differentiated     Primary non-small cell carcinoma of upper lobe of left lung  06/13/2021 Initial Diagnosis   Primary non-small cell carcinoma of upper lobe of left lung [facility]) -06/20/2021 - 06/27/2021, patient presented to [facility] due to progressive headache/dizziness/gait changes. CT head showed acute to subacute intraparenchymal hemorrhage involving the left cerebellum.  Surrounding low-density vasogenic edema.  Patient was transferred to [facility]. This was further evaluated by CT angiogram of the neck which showed bulky calcified plaques/stenosis of carotid artery, Followed by MRI brain. 06/12/2021, MRI of the brain showed no significant interval change in size of the left cerebellar intraparenchymal hematoma with unchanged regional mass effect and partial effacement of fourth ventricle but no upstream hydrocephalus. There is no discernible enhancement to suggest underlying mass lesion, though acute blood products could mask enhancement.   06/12/2021 a chest x-ray showed a 4.4 cm left middle lobe. 06/13/2021, CT chest with contrast showed a 4.3 x 3.6 x 3.3 cm lobular spiculated mass in the posterior left upper lobe with T3 to the lateral pleura and major fissure.  Metastatic left hilar lymphadenopathy.  Peripheral micronodularity  posterior right costophrenic sulcus.  Aortic atherosclerosis. 06/14/2021, CT abdomen pelvis showed right lower lobe  pulmonary artery embolus.  No evidence of right heart strain.  No acute intra-abdominal or pelvic pathology.  Aortic atherosclerosis.  06/13/2021, patient underwent bronchoscopy with EBUS by Dr. Katrinka Blazing.  Biopsy from the fine-needle aspiration of station 11 mL showed malignant cells, consistent with poorly differentiated non-small cell carcinoma, consistent with adenocarcinoma.  Malignant cells are TTF-1 positive and negative for p40.  Negative for neuroendocrine markers.  Blood test and tissue sample were tested for Gardant 360  -PD-L1 TPS 97%, no actionable mutation on the blood testing. 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   07/17/2021 Cancer Staging   Staging form: Lung, AJCC 8th Edition - Clinical: Stage IV (cT2b, cN1, cM1) - Signed by [name], MD on 08/06/2021   08/06/2021 Imaging   I saw patient's PET scan after his visit with me on 08/06/21. PET scan was ordered by his previous oncologist group and did not come to my in basket.. Patient received cycle 1 carboplatin Taxol today.  He has started on radiation.  5/26/3 PET scan showed 3.8 cm hypermetabolic left upper lobe mass with hypermetabolic left hilar and infrahilar adenopathy.  There is approximately 8 scattered metastatic lesions in the skeleton. Mixed density photopenic lesion in the left cerebellum. characterized as hemorrhage on recent prior imaging workups.    08/16/2021 -  Chemotherapy   Patient is on Treatment Plan : LUNG Carboplatin (5) + Pemetrexed + Pembrolizumab (200) D1 q21d Induction x 4 cycles / Maintenance Pemetrexed + Pembrolizumab (200) D1 q21d      08/22/2021 Imaging   MRI brain w wo contrast  Decrease in size of left cerebellar hematoma with resolution of edema. No evidence of underlying lesion. Smaller, more recent hemorrhage in the left cerebellar vermis with minimal edema. There is minimal enhancement without definite evidence of underlying lesion.  New punctate focus of chronic blood products and  enhancement in the left frontal lobe. Additional new foci of chronic blood products in the posterior right putamen, right parietal subcortical white matter, and posterior right cerebellum. Unclear at this time if these represent foci of bland hemorrhage or early metastases.   Increase in size of right parietal osseous metastasis with minor extraosseous extension.     Imaging   PET scan showed 1. Interval response to therapy as evidenced by a small residual left upper lobe nodule with decreased hypermetabolism, no residual hypermetabolic adenopathy and decreased hypermetabolism associatedwith osseous metastases. 2. 6 mm posterior left upper lobe nodule, likely stable. Recommend attention on follow-up. 3. Aortic atherosclerosis (ICD10-I70.0). Coronary artery calcification.   12/17/2021 Imaging   MRI lumbar spine w wo contrast  1. No evidence of regional metastatic disease. 2. Late subacute superior endplate fracture at L2 and large superior endplate Schmorl's node at L5, unchanged since the PET scan of 10/29/2021. 3. L3-4: Shallow disc protrusion. Facet and ligamentous hypertrophy. Stenosis of both lateral recesses. Findings slightly worsened since 2022. 4. L4-5: Shallow disc protrusion. Facet and ligamentous hypertrophy. Small synovial cyst arising from the facet joint on the right. Stenosis of the lateral recesses right worse than left. Foraminal narrowing right worse than left. Findings have worsened since 2022.  5. L5-S1: Endplate osteophytes and shallow protrusion of the disc. Facet and ligamentous hypertrophy. Stenosis of the subarticular lateral recesses and neural foramina, right worse than left. Similar appearance to the study of 2022. 6. Continued evidence of enteritis of at least 1 loop of small bowel. Distended  bladder present   12/18/2021 Imaging   Brian MRI w wo  1. Interval decrease in size of the left cerebellar and vermian hematomas. No definite evidence of underlying metastatic  lesion. 2. There is are two possible contrast enhancing lesions in the posterior left frontal lobe and left occipital lobe, which do not have intrinsic T1 signal abnormality, but have a somewhat linear appearance and may be vascular in nature. Recommend attention on follow up. 3. Multifocal sites of susceptibility artifact are redemonstrated with interval development of a few new foci, as described above. All of these sites demonstrate intrinsic T1 hyperintense signal abnormality and susceptibility artifact and are compatible with sites of microhemorrhages. Recommend continued attention on follow up. 4. Interval decrease in size of right parietal calvarial metastatic lesion. No new contrast enhancing lesions visualized. 5. Diffusely heterogeneous marrow signal throughout the cervical spine, which is nonspecific but can be seen in the setting of anemia, smoking, obesity, or a marrow replacement process.     12/18/2021 Imaging   MRI lumbar spine w wo contrast  1. No evidence of regional metastatic disease. 2. Late subacute superior endplate fracture at L2 and large superior endplate Schmorl's node at L5, unchanged since the PET scan of 10/29/2021 3. L3-4: Shallow disc protrusion. Facet and ligamentous hypertrophy.Stenosis of both lateral recesses. Findings slightly worsened since 2022. 4. L4-5: Shallow disc protrusion. Facet and ligamentous hypertrophy. Small synovial cyst arising from the facet joint on the right. Stenosis of the lateral recesses right worse than left. Foraminal narrowing right worse than left. Findings have worsened since 2022. 5. L5-S1: Endplate osteophytes and shallow protrusion of the disc. Facet and ligamentous hypertrophy. Stenosis of the subarticular lateral recesses and neural foramina, right worse than left. Similar appearance to the study of 2022. 6. Continued evidence of enteritis of at least 1 loop of small bowel. Distended bladder present.       01/28/2022 Imaging    CT chest abdomen pelvis w contrast 1. Treated left upper lobe mass appears grossly stable to the prior examination when measured in a similar fashion on the prior study. No definite signs of extra skeletal metastatic disease noted in the chest, abdomen or pelvis. 2. Widespread skeletal metastases redemonstrated, as above. 3. Hepatic steatosis. 4. Aortic atherosclerosis, in addition to left main and three-vessel coronary artery disease. Assessment for potential risk factor modification, dietary therapy or pharmacologic therapy may be warranted, if clinically indicated. 5. Additional incidental findings,    04/08/2022 Imaging   MRI lumbar spine w wo contrast  1. Stable remote superior endplate fracture of L2 and large Schmorl to noted involving L5. No worrisome bone lesions to suggest metastatic disease. 2. Degenerative lumbar spondylosis with multilevel disc disease and facet disease which appears relatively stable as detailed above. 3. Enlarging right-sided synovial cyst at L4-5 with progressive mass effect on the right side of the thecal sac. This could potentially be symptomatic   04/28/2022 Imaging   CT chest abdomen pelvis wo contrast  1. Similar versus slightly decreased size of treated left upper lobe primary bronchogenic carcinoma. 2. Similar osseous metastasis. 3. No evidence of extraosseous metastatic disease. 4. New small right pleural effusion. 5. Coronary artery atherosclerosis. Aortic Atherosclerosis   # Patient has a history of melanoma on his neck, treated in 2009. # History of hemorrhagic infarct left cerebellum  INTERVAL HISTORY DARLENE BARTELT is a 72 y.o. male who has above history reviewed by me today presents for follow up visit for management of  Stage IV  lung adenocarcianoma # Pulmonary embolism, SOB is stable, not worse. Off Eliquis due to MRI findings.  + back pain, and left lower extremity weakness due to pain, on fentanyl patch Q72 hours and percocet, he  sees orthopedic surgeon for epidural injections.  Accompanied by wife.    Review of Systems  Constitutional:  Positive for fatigue. Negative for appetite change, chills, fever and unexpected weight change.  HENT:   Negative for hearing loss and voice change.   Eyes:  Negative for eye problems and icterus.  Respiratory:  Positive for shortness of breath. Negative for chest tightness and cough.   Cardiovascular:  Positive for leg swelling. Negative for chest pain.  Gastrointestinal:  Negative for abdominal distention, abdominal pain and blood in stool.  Endocrine: Negative for hot flashes.  Genitourinary:  Negative for difficulty urinating, dysuria and frequency.   Musculoskeletal:  Positive for back pain. Negative for arthralgias.  Skin:  Negative for itching and rash.  Neurological:  Negative for extremity weakness, headaches, light-headedness and numbness.  Hematological:  Negative for adenopathy. Does not bruise/bleed easily.  Psychiatric/Behavioral:  Positive for sleep disturbance. Negative for confusion.     MEDICAL HISTORY:  Past Medical History:  Diagnosis Date   Allergy    BPH (benign prostatic hypertrophy)    Cancer    Melanoma on Neck    2008   Carotid artery occlusion    CHF (congestive heart failure)    Diabetes mellitus    type 2   ED (erectile dysfunction)    GERD (gastroesophageal reflux disease)    Hemorrhagic stroke 06/2021   Hyperlipidemia    Hypertension    Lung cancer    Neoplasm related pain    Retinopathy due to secondary DM     SURGICAL HISTORY: Past Surgical History:  Procedure Laterality Date   BRONCHIAL NEEDLE ASPIRATION BIOPSY  06/17/2021   Procedure: BRONCHIAL NEEDLE ASPIRATION BIOPSIES;  Surgeon: Lorin Glass, MD;  Location: [facility] ENDOSCOPY;  Service: Pulmonary;;   IR IMAGING GUIDED PORT INSERTION  08/05/2021   MELANOMA EXCISION  2008   Left side of neck   RADIOLOGY WITH ANESTHESIA N/A 04/05/2020   Procedure: MRI SPINE WITOUT CONTRAST;   Surgeon: Radiologist, Medication, MD;  Location: [facility] OR;  Service: Radiology;  Laterality: N/A;   RADIOLOGY WITH ANESTHESIA N/A 08/22/2021   Procedure: MRI BRAIN WITH AND WITHOUT CONTRAST  WITH ANESTHESIA;  Surgeon: Radiologist, Medication, MD;  Location: [facility] OR;  Service: Radiology;  Laterality: N/A;   RADIOLOGY WITH ANESTHESIA N/A 12/17/2021   Procedure: MRI BRAIN WITH AND WITHOUT CONTRAST WITH ANESTHESIA; MRI LUMBER WITH AND WITHOUT CONTRAST;  Surgeon: Radiologist, Medication, MD;  Location: [facility] OR;  Service: Radiology;  Laterality: N/A;   RADIOLOGY WITH ANESTHESIA N/A 04/08/2022   Procedure: MRI LUMBER SPINE WITH AND WITHOUT CONTRAST;  Surgeon: Radiologist, Medication, MD;  Location: [facility] OR;  Service: Radiology;  Laterality: N/A;   TONSILLECTOMY     VIDEO BRONCHOSCOPY WITH ENDOBRONCHIAL ULTRASOUND N/A 06/17/2021   Procedure: VIDEO BRONCHOSCOPY WITH ENDOBRONCHIAL ULTRASOUND;  Surgeon: Lorin Glass, MD;  Location[facility] ENDOSCOPY;  Service: Pulmonary;  Laterality: N/A;    SOCIAL HISTORY: Social History   Socioeconomic History   Marital status: Married    Spouse name: Not on file   Number of children: Not on file   Years of education: Not on file   Highest education level: Not on file  Occupational History   Not on file  Tobacco Use   Smoking status: Former  Types: Cigarettes    Quit date: 07/21/1990    Years since quitting: 31.9   Smokeless tobacco: Never  Vaping Use   Vaping Use: Never used  Substance and Sexual Activity   Alcohol use: No   Drug use: No   Sexual activity: Not Currently  Other Topics Concern   Not on file  Social History Narrative   Not on file   Social Determinants of Health   Financial Resource Strain: Low Risk  (04/17/2022)   Overall Financial Resource Strain (CARDIA)    Difficulty of Paying Living Expenses: Not hard at all  Food Insecurity: No Food Insecurity (04/17/2022)   Hunger Vital Sign    Worried About Running Out of Food in the Last Year: Never true     Ran Out of Food in the Last Year: Never true  Transportation Needs: No Transportation Needs (03/27/2022)   PRAPARE - Administrator, Civil Service (Medical): No    Lack of Transportation (Non-Medical): No  Physical Activity: Inactive (03/27/2022)   Exercise Vital Sign    Days of Exercise per Week: 0 days    Minutes of Exercise per Session: 0 min  Stress: Stress Concern Present (03/27/2022)   [employer] of Occupational Health - Occupational Stress Questionnaire    Feeling of Stress : To some extent  Social Connections: Moderately Isolated (03/27/2022)   Social Connection and Isolation Panel [NHANES]    Frequency of Communication with Friends and Family: More than three times a week    Frequency of Social Gatherings with Friends and Family: Three times a week    Attends Religious Services: Never    Active Member of Clubs or Organizations: No    Attends Banker Meetings: Never    Marital Status: Married  Catering manager Violence: Not At Risk (03/27/2022)   Humiliation, Afraid, Rape, and Kick questionnaire    Fear of Current or Ex-Partner: No    Emotionally Abused: No    Physically Abused: No    Sexually Abused: No    FAMILY HISTORY: Family History  Problem Relation Age of Onset   COPD Mother    Heart disease Father    Heart disease Brother        MI at age 44   Stroke Neg Hx     ALLERGIES:  is allergic to niaspan [niacin].  MEDICATIONS:  Current Outpatient Medications  Medication Sig Dispense Refill   aspirin EC 81 MG tablet Take 1 tablet (81 mg total) by mouth daily. Swallow whole. 30 tablet 12   atorvastatin (LIPITOR) 40 MG tablet Take 1 tablet (40 mg total) by mouth daily. 60 tablet 2   Ca Phosphate-Cholecalciferol (CALCIUM WITH D3 PO) Take 1 capsule by mouth daily.     folic acid (FOLVITE) 800 MCG tablet Take 1 tablet (800 mcg total) by mouth daily. 60 tablet 1   furosemide (LASIX) 20 MG tablet Take 1 tablet (20 mg total) by mouth daily  as needed (leg swelling or sob). 30 tablet 3   gabapentin (NEURONTIN) 300 MG capsule Take 1 capsule (300 mg total) by mouth 3 (three) times daily. 90 capsule 1   insulin glargine (LANTUS) 100 UNIT/ML injection Inject 30 Units into the skin daily as needed. Only takes Lantus when sugar is above 150.     neomycin-polymyxin b-dexamethasone (MAXITROL) 3.5-10000-0.1 SUSP 1 drop 3 (three) times daily.     NOVOLOG FLEXPEN 100 UNIT/ML FlexPen Inject 0-10 Units into the skin daily as needed for high blood  sugar (above 200). Sliding scale     omeprazole (PRILOSEC) 20 MG capsule Take 20 mg by mouth daily.     Probiotic Product (PROBIOTIC DAILY PO) Take by mouth. Physician's Choice Probiotic     tamsulosin (FLOMAX) 0.4 MG CAPS capsule TAKE 1 CAPSULE(0.4 MG) BY MOUTH DAILY 30 capsule 0   traZODone (DESYREL) 50 MG tablet Take 1 tablet (50 mg total) by mouth at bedtime as needed for sleep. 30 tablet 3   UNABLE TO FIND PLEASE CHECK FASTING BLOOD GLUCOSE EVERY MORNING 1 each 0   valsartan (DIOVAN) 80 MG tablet Take 1 tablet (80 mg total) by mouth daily. 90 tablet 3   ALPRAZolam (XANAX) 0.5 MG tablet Take 1 tablet (0.5 mg total) by mouth See admin instructions. Take 1 tablet 30 minutes prior to imaging procedure, may take another tablet if needed. 2 tablet 0   fentaNYL (DURAGESIC) 75 MCG/HR Place 1 patch onto the skin every 3 (three) days. 10 patch 0   oxyCODONE-acetaminophen (PERCOCET) 7.5-325 MG tablet Take 1 tablet by mouth every 4 (four) hours as needed for severe pain. Do not take with cough medication or other pain medication 60 tablet 0   No current facility-administered medications for this visit.   Facility-Administered Medications Ordered in Other Visits  Medication Dose Route Frequency Provider Last Rate Last Admin   cyanocobalamin (VITAMIN B12) injection 1,000 mcg  1,000 mcg Intramuscular Once [name], MD       heparin lock flush 100 UNIT/ML injection              PHYSICAL EXAMINATION:  Vitals:    06/16/22 1306  BP: (!) 97/51  Pulse: 77  Resp: 18  Temp: (!) 96.7 F (35.9 C)  SpO2: 97%   Filed Weights   06/16/22 1306  Weight: 143 lb 3.2 oz (65 kg)    Physical Exam Constitutional:      General: He is not in acute distress.    Appearance: He is ill-appearing.  HENT:     Head: Normocephalic and atraumatic.  Eyes:     General: No scleral icterus. Cardiovascular:     Rate and Rhythm: Normal rate and regular rhythm.     Heart sounds: Normal heart sounds.  Pulmonary:     Effort: Pulmonary effort is normal. No respiratory distress.     Breath sounds: No wheezing.     Comments: Decreased breath sound bilaterally. Abdominal:     General: Bowel sounds are normal. There is no distension.     Palpations: Abdomen is soft.  Musculoskeletal:        General: No deformity. Normal range of motion.     Cervical back: Normal range of motion and neck supple.     Comments: Bilateral ankle trace edema.  Skin:    General: Skin is warm and dry.     Findings: No rash.  Neurological:     Mental Status: He is alert. Mental status is at baseline.     Cranial Nerves: No cranial nerve deficit.     Coordination: Coordination normal.  Psychiatric:        Mood and Affect: Mood normal.     LABORATORY DATA:  I have reviewed the data as listed    Latest Ref Rng & Units 06/16/2022   12:55 PM 05/26/2022   12:52 PM 05/05/2022   12:52 PM  CBC  WBC 4.0 - 10.5 K/uL 5.8  6.2  6.0   Hemoglobin 13.0 - 17.0 g/dL 77.8  9.4  9.7  Hematocrit 39.0 - 52.0 % 31.6  29.4  30.5   Platelets 150 - 400 K/uL 241  293  317       Latest Ref Rng & Units 06/16/2022   12:55 PM 05/26/2022   12:52 PM 05/05/2022   12:52 PM  CMP  Glucose 70 - 99 mg/dL 161  096  045   BUN 8 - 23 mg/dL 19  25  26    Creatinine 0.61 - 1.24 mg/dL 4.09  8.11  9.14   Sodium 135 - 145 mmol/L 133  132  133   Potassium 3.5 - 5.1 mmol/L 3.6  3.9  3.6   Chloride 98 - 111 mmol/L 100  101  98   CO2 22 - 32 mmol/L 25  25  26    Calcium 8.9 -  10.3 mg/dL 8.9  9.3  8.6   Total Protein 6.5 - 8.1 g/dL 6.7  7.2  6.5   Total Bilirubin 0.3 - 1.2 mg/dL 0.3  0.3  0.3   Alkaline Phos 38 - 126 U/L 64  85  85   AST 15 - 41 U/L 20  20  20    ALT 0 - 44 U/L 10  10  10       Iron/TIBC/Ferritin/ %Sat    Component Value Date/Time   IRON 60 02/03/2022 0958   TIBC 391 02/03/2022 0958   FERRITIN 704 (H) 02/03/2022 0958   IRONPCTSAT 15 (L) 02/03/2022 7829      RADIOGRAPHIC STUDIES: I have personally reviewed the radiological images as listed and agreed with the findings in the report. DG HIP UNILAT WITH PELVIS 2-3 VIEWS LEFT  Result Date: 05/30/2022 CLINICAL DATA:  Left hip pain, recent fall, history of non-small cell left upper lobe lung cancer EXAM: DG HIP (WITH OR WITHOUT PELVIS) 2-3V LEFT COMPARISON:  PET scan 10/29/2021 FINDINGS: Frontal view of the pelvis as well as frontal and frogleg lateral views of the left hip are obtained. There are no acute displaced fractures. Joint spaces are well preserved. The hypermetabolic lytic metastases seen within the bilateral iliac bones and lower lumbar spine on prior PET CT are difficult to appreciate on radiograph. Slight asymmetric sclerosis in the right iliac bone near the anterior superior iliac spine could reflect reparative changes at the site of known metastasis. IMPRESSION: 1. No acute displaced fractures. Specifically, no abnormality of the left hip is identified. 2. The known lytic bony metastases seen on prior PET scans are not readily apparent by x-ray. Electronically Signed   By: Sharlet Salina M.D.   On: 05/30/2022 16:50

## 2022-06-16 NOTE — Assessment & Plan Note (Addendum)
Back pain, MRI lumbar showed compression fracture Continue PRN percocet 7.5mg /325mg , fentanyl patch Q72 hours MRI lumbar showed DJD, L4-L5 synovial cyst, no new bone mets.  Patient gets epidural steroid injections.

## 2022-06-16 NOTE — Assessment & Plan Note (Addendum)
Previous MRI brain shows persistent microbleeds.   Off Eliquis 2.5mg  BID.

## 2022-06-16 NOTE — Assessment & Plan Note (Signed)
Currently swelling is minimal. BP is low, continue  hold off Lasix.

## 2022-06-16 NOTE — Assessment & Plan Note (Addendum)
Recommend Zometa every 4- 6 weeks.-proceed today.  Continue calcium supplementation.  MRI lumbar with and without contrast showed  Stable remote superior endplate fracture of L2 and large Schmorl to noted involving L5. 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 Pending PET evaluation to see if any area could be further treated with palliative RT

## 2022-06-16 NOTE — Assessment & Plan Note (Addendum)
Hold Eliquis.  Follow-up with Dr. Barbaraann Cao Repeat MRI brain-stable findings

## 2022-06-18 ENCOUNTER — Ambulatory Visit: Payer: PPO | Attending: Oncology

## 2022-06-18 DIAGNOSIS — R600 Localized edema: Secondary | ICD-10-CM | POA: Diagnosis not present

## 2022-06-18 DIAGNOSIS — M7989 Other specified soft tissue disorders: Secondary | ICD-10-CM

## 2022-06-18 LAB — ECHOCARDIOGRAM COMPLETE
AR max vel: 2.74 cm2
AV Area VTI: 2.75 cm2
AV Area mean vel: 2.71 cm2
AV Mean grad: 2 mmHg
AV Peak grad: 3.8 mmHg
Ao pk vel: 0.98 m/s
Area-P 1/2: 3.03 cm2
Calc EF: 47.7 %
S' Lateral: 3.6 cm
Single Plane A2C EF: 47.3 %
Single Plane A4C EF: 48.9 %

## 2022-06-23 ENCOUNTER — Telehealth: Payer: PPO | Admitting: Hospice and Palliative Medicine

## 2022-06-23 ENCOUNTER — Ambulatory Visit: Payer: PPO

## 2022-06-23 DIAGNOSIS — H2511 Age-related nuclear cataract, right eye: Secondary | ICD-10-CM | POA: Diagnosis not present

## 2022-06-23 DIAGNOSIS — Z961 Presence of intraocular lens: Secondary | ICD-10-CM | POA: Diagnosis not present

## 2022-06-23 DIAGNOSIS — H2512 Age-related nuclear cataract, left eye: Secondary | ICD-10-CM | POA: Diagnosis not present

## 2022-06-25 ENCOUNTER — Ambulatory Visit: Payer: PPO | Admitting: Radiation Oncology

## 2022-06-26 ENCOUNTER — Ambulatory Visit
Admission: RE | Admit: 2022-06-26 | Discharge: 2022-06-26 | Disposition: A | Discharge status: Home / Self Care | Payer: PPO | Source: Ambulatory Visit | Attending: Radiation Oncology | Admitting: Radiation Oncology

## 2022-06-26 DIAGNOSIS — C7951 Secondary malignant neoplasm of bone: Secondary | ICD-10-CM

## 2022-06-30 ENCOUNTER — Encounter: Payer: Self-pay | Admitting: Internal Medicine

## 2022-06-30 ENCOUNTER — Ambulatory Visit: Payer: PPO | Attending: Internal Medicine | Admitting: Internal Medicine

## 2022-06-30 VITALS — BP 126/78 | HR 79 | Ht 67.0 in | Wt 139.8 lb

## 2022-06-30 DIAGNOSIS — Z5181 Encounter for therapeutic drug level monitoring: Secondary | ICD-10-CM

## 2022-06-30 DIAGNOSIS — Z79899 Other long term (current) drug therapy: Secondary | ICD-10-CM | POA: Diagnosis not present

## 2022-06-30 NOTE — Progress Notes (Signed)
Cardiology Office Note:    Date:  06/30/2022   ID:  Micheal Donovan, DOB 02-Aug-1950, MRN [id number]  PCP:  Donita Brooks, MD   [facility] Providers Cardiologist:  Maisie Fus, MD     Referring MD: Donita Brooks, MD   No chief complaint on file. Cardiotoxic chemo  History of Present Illness:    Micheal Donovan is a 71 y.o. 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 He is being diuresed and his crt bumped. 1.1 02/25/2022 to 1.85 03/18/2022. Recent PET scan shows good partial response with therapy, pending CT; holding off on contrast with AKI. He has hx of PE but brain MRI showed small bleed, so eliquis is held. He is now off the lasix. He is still having some swelling in his feet. They have improved overall. He denies angina, dyspnea on exertion, lower extremity edema, PND or orthopnea. BP 100/68 mmhg. He is taking asa 81 mg daily. No prior heart disease. His Alimta/Keytruda has been held.  Interim hx 06/30/2022 He continues on ICI therapy. No bleeding issues on aspirin. He has back and hip pain. No CP or SOB with walking. He notes some feet swelling off and on.   Past Medical History:  Diagnosis Date   Allergy    BPH (benign prostatic hypertrophy)    Cancer (HCC)    Melanoma on Neck    2008   Carotid artery occlusion    CHF (congestive heart failure) (HCC)    Diabetes mellitus    type 2   ED (erectile dysfunction)    GERD (gastroesophageal reflux disease)    Hemorrhagic stroke (HCC) 06/2021   Hyperlipidemia    Hypertension    Lung cancer (HCC)    Neoplasm related pain    Retinopathy due to secondary DM [facility])     Past Surgical History:  Procedure Laterality Date   BRONCHIAL NEEDLE ASPIRATION BIOPSY  06/17/2021   Procedure: BRONCHIAL NEEDLE ASPIRATION BIOPSIES;  Surgeon: Lorin Glass, MD;  Location: [facility] ENDOSCOPY;   Service: Pulmonary;;   IR IMAGING GUIDED PORT INSERTION  08/05/2021   MELANOMA EXCISION  2008   Left side of neck   RADIOLOGY WITH ANESTHESIA N/A 04/05/2020   Procedure: MRI SPINE WITOUT CONTRAST;  Surgeon: Radiologist, Medication, MD;  Location: [facility] OR;  Service: Radiology;  Laterality: N/A;   RADIOLOGY WITH ANESTHESIA N/A 08/22/2021   Procedure: MRI BRAIN WITH AND WITHOUT CONTRAST  WITH ANESTHESIA;  Surgeon: Radiologist, Medication, MD;  Location: [facility] OR;  Service: Radiology;  Laterality: N/A;   RADIOLOGY WITH ANESTHESIA N/A 12/17/2021   Procedure: MRI BRAIN WITH AND WITHOUT CONTRAST WITH ANESTHESIA; MRI LUMBER WITH AND WITHOUT CONTRAST;  Surgeon: Radiologist, Medication, MD;  Location: [facility] OR;  Service: Radiology;  Laterality: N/A;   RADIOLOGY WITH ANESTHESIA N/A 04/08/2022   Procedure: MRI LUMBER SPINE WITH AND WITHOUT CONTRAST;  Surgeon: Radiologist, Medication, MD;  Location: [facility] OR;  Service: Radiology;  Laterality: N/A;   TONSILLECTOMY     VIDEO BRONCHOSCOPY WITH ENDOBRONCHIAL ULTRASOUND N/A 06/17/2021   Procedure: VIDEO BRONCHOSCOPY WITH ENDOBRONCHIAL ULTRASOUND;  Surgeon: Lorin Glass, MD;  Location: [facility] ENDOSCOPY;  Service: Pulmonary;  Laterality: N/A;    Current Medications: No outpatient medications have been marked as taking for the 06/30/22 encounter (Appointment) with Maisie Fus, MD.  Allergies:   Niaspan [niacin]   Social History   Socioeconomic History   Marital status: Married    Spouse name: Not on file   Number of children: Not on file   Years of education: Not on file   Highest education level: Not on file  Occupational History   Not on file  Tobacco Use   Smoking status: Former    Types: Cigarettes    Quit date: 07/21/1990    Years since quitting: 31.9   Smokeless tobacco: Never  Vaping Use   Vaping Use: Never used  Substance and Sexual Activity   Alcohol use: No   Drug use: No   Sexual activity: Not Currently  Other Topics Concern   Not on file   Social History Narrative   Not on file   Social Determinants of Health   Financial Resource Strain: Low Risk  (04/17/2022)   Overall Financial Resource Strain (CARDIA)    Difficulty of Paying Living Expenses: Not hard at all  Food Insecurity: No Food Insecurity (04/17/2022)   Hunger Vital Sign    Worried About Running Out of Food in the Last Year: Never true    Ran Out of Food in the Last Year: Never true  Transportation Needs: No Transportation Needs (03/27/2022)   PRAPARE - Administrator, Civil Service (Medical): No    Lack of Transportation (Non-Medical): No  Physical Activity: Inactive (03/27/2022)   Exercise Vital Sign    Days of Exercise per Week: 0 days    Minutes of Exercise per Session: 0 min  Stress: Stress Concern Present (03/27/2022)   [employer] of Occupational Health - Occupational Stress Questionnaire    Feeling of Stress : To some extent  Social Connections: Moderately Isolated (03/27/2022)   Social Connection and Isolation Panel [NHANES]    Frequency of Communication with Friends and Family: More than three times a week    Frequency of Social Gatherings with Friends and Family: Three times a week    Attends Religious Services: Never    Active Member of Clubs or Organizations: No    Attends Banker Meetings: Never    Marital Status: Married     Family History: The patient's family history includes COPD in his mother; Heart disease in his brother and father. There is no history of Stroke.  ROS:   Please see the history of present illness.     All other systems reviewed and are negative.  EKGs/Labs/Other Studies Reviewed:    The following studies were reviewed today:   EKG:  EKG is  ordered today.  The ekg ordered today demonstrates   03/25/2022- NSR, 1st degree AV block, RBBB, LAD  06/12/2021- TTE: EF 55-60%, nl RV,no significant valve dx 06/19/2021-TTE EF 45-50% mildly decreased EF, nl RV fxn, no sig valve dx  06/17/2021- TTE  EF 55-60%  Recent Labs: 11/20/2021: Magnesium 1.8 03/25/2022: BNP 48.0 05/26/2022: TSH 2.443 06/16/2022: ALT 10; BUN 19; Creatinine, Ser 1.42; Hemoglobin 10.2; Platelets 241; Potassium 3.6; Sodium 133    Recent Lipid Panel    Component Value Date/Time   CHOL 266 (H) 06/12/2021 0432   TRIG 144 06/12/2021 0432   HDL 24 (L) 06/12/2021 0432   CHOLHDL 11.1 06/12/2021 0432   VLDL 29 06/12/2021 0432   LDLCALC 213 (H) 06/12/2021 0432   LDLCALC 81 04/29/2017 0822     Risk Assessment/Calculations:     Physical Exam:    VS:  Vitals:   06/30/22 1407  BP:  126/78  Pulse: 79  SpO2: 95%     Wt Readings from Last 3 Encounters:  06/16/22 143 lb 3.2 oz (65 kg)  06/10/22 145 lb (65.8 kg)  05/26/22 149 lb 6.4 oz (67.8 kg)     GEN:  Well nourished, well developed in no acute distress HEENT: Normal NECK: No JVD; No carotid bruits CARDIAC: RRR, no murmurs, rubs, gallops RESPIRATORY:  Clear to auscultation without rales, wheezing or rhonchi  ABDOMEN: Soft, non-tender, non-distended MUSCULOSKELETAL:  No edema; No deformity  SKIN: Warm and dry NEUROLOGIC:  Alert and oriented x 3 PSYCHIATRIC:  Normal affect   ASSESSMENT:    Cardiac Dysfunction: He is on an ICI; which was held. For declined EF; however he has 3V CAC. He had no angina at that time. Myocarditis unlikely. EF has improved. No signs of heart failure - EF improved, agree with continued ICI - can continue off lasix - bsl BNP , 48  3V CAD - Continue asa 81 mg daily.  -The caveat is, if he has ischemic disease; DAPT would be recommended if he required a PCI. He has small brain bleed which would be a contraindication - wont plan to be aggressive with lipids on with metastatic dx and frailty. Likely won't experience the long-term benefit  Hx of PE: off eliquis 2/2 brain microbleeds  HTN: valsartan 80 mg will stop with low Bps. Likely normalized with weight loss PLAN:    In order of problems listed above:   Stop  valsartan Follow up PRN           Medication Adjustments/Labs and Tests Ordered: Current medicines are reviewed at length with the patient today.  Concerns regarding medicines are outlined above.  No orders of the defined types were placed in this encounter.  No orders of the defined types were placed in this encounter.   There are no Patient Instructions on file for this visit.   Signed, Maisie Fus, MD  06/30/2022 2:06 PM    [facility]

## 2022-06-30 NOTE — Patient Instructions (Addendum)
Medication Instructions:  Your physician has recommended you make the following change in your medication:  STOP: Valsartan *If you need a refill on your cardiac medications before your next appointment, please call your pharmacy*   Lab Work: None   Testing/Procedures: None   Follow-Up: At [facility], you and your health needs are our priority.  As part of our continuing mission to provide you with exceptional heart care, we have created designated Provider Care Teams.  These Care Teams include your primary Cardiologist (physician) and Advanced Practice Providers (APPs -  Physician Assistants and Nurse Practitioners) who all work together to provide you with the care you need, when you need it.    Your next appointment:    As needed  Provider:   Maisie Fus, MD

## 2022-07-02 ENCOUNTER — Ambulatory Visit: Payer: PPO | Admitting: Radiation Oncology

## 2022-07-03 ENCOUNTER — Ambulatory Visit
Admission: RE | Admit: 2022-07-03 | Discharge: 2022-07-03 | Disposition: A | Discharge status: Home / Self Care | Payer: PPO | Source: Ambulatory Visit | Attending: Radiation Oncology | Admitting: Radiation Oncology

## 2022-07-03 DIAGNOSIS — I700 Atherosclerosis of aorta: Secondary | ICD-10-CM | POA: Insufficient documentation

## 2022-07-03 DIAGNOSIS — C3412 Malignant neoplasm of upper lobe, left bronchus or lung: Secondary | ICD-10-CM | POA: Diagnosis not present

## 2022-07-03 DIAGNOSIS — J984 Other disorders of lung: Secondary | ICD-10-CM | POA: Insufficient documentation

## 2022-07-03 LAB — GLUCOSE, CAPILLARY: Glucose-Capillary: 120 mg/dL — ABNORMAL HIGH (ref 70–99)

## 2022-07-03 MED ORDER — FLUDEOXYGLUCOSE F - 18 (FDG) INJECTION
7.2000 | Freq: Once | INTRAVENOUS | Status: AC | PRN
  Administered 2022-07-03: 7.77 via INTRAVENOUS

## 2022-07-07 ENCOUNTER — Inpatient Hospital Stay (HOSPITAL_BASED_OUTPATIENT_CLINIC_OR_DEPARTMENT_OTHER): Payer: PPO | Admitting: Oncology

## 2022-07-07 ENCOUNTER — Inpatient Hospital Stay: Payer: PPO

## 2022-07-07 ENCOUNTER — Ambulatory Visit: Payer: PPO | Admitting: Oncology

## 2022-07-07 ENCOUNTER — Inpatient Hospital Stay: Payer: PPO | Attending: Radiation Oncology

## 2022-07-07 ENCOUNTER — Other Ambulatory Visit: Payer: PPO

## 2022-07-07 ENCOUNTER — Ambulatory Visit: Payer: PPO

## 2022-07-07 ENCOUNTER — Encounter: Payer: Self-pay | Admitting: Oncology

## 2022-07-07 VITALS — BP 109/63 | HR 79 | Temp 97.8°F | Resp 20 | Wt 136.2 lb

## 2022-07-07 DIAGNOSIS — E785 Hyperlipidemia, unspecified: Secondary | ICD-10-CM | POA: Diagnosis not present

## 2022-07-07 DIAGNOSIS — Z87891 Personal history of nicotine dependence: Secondary | ICD-10-CM | POA: Insufficient documentation

## 2022-07-07 DIAGNOSIS — Z510 Encounter for antineoplastic radiation therapy: Secondary | ICD-10-CM | POA: Insufficient documentation

## 2022-07-07 DIAGNOSIS — I619 Nontraumatic intracerebral hemorrhage, unspecified: Secondary | ICD-10-CM | POA: Diagnosis not present

## 2022-07-07 DIAGNOSIS — C7951 Secondary malignant neoplasm of bone: Secondary | ICD-10-CM

## 2022-07-07 DIAGNOSIS — Z5112 Encounter for antineoplastic immunotherapy: Secondary | ICD-10-CM | POA: Insufficient documentation

## 2022-07-07 DIAGNOSIS — C3412 Malignant neoplasm of upper lobe, left bronchus or lung: Secondary | ICD-10-CM | POA: Diagnosis not present

## 2022-07-07 DIAGNOSIS — G893 Neoplasm related pain (acute) (chronic): Secondary | ICD-10-CM | POA: Diagnosis not present

## 2022-07-07 DIAGNOSIS — I10 Essential (primary) hypertension: Secondary | ICD-10-CM | POA: Insufficient documentation

## 2022-07-07 DIAGNOSIS — N400 Enlarged prostate without lower urinary tract symptoms: Secondary | ICD-10-CM | POA: Diagnosis not present

## 2022-07-07 DIAGNOSIS — E119 Type 2 diabetes mellitus without complications: Secondary | ICD-10-CM | POA: Diagnosis not present

## 2022-07-07 DIAGNOSIS — I509 Heart failure, unspecified: Secondary | ICD-10-CM | POA: Insufficient documentation

## 2022-07-07 DIAGNOSIS — R29898 Other symptoms and signs involving the musculoskeletal system: Secondary | ICD-10-CM

## 2022-07-07 DIAGNOSIS — E861 Hypovolemia: Secondary | ICD-10-CM

## 2022-07-07 DIAGNOSIS — R634 Abnormal weight loss: Secondary | ICD-10-CM | POA: Insufficient documentation

## 2022-07-07 DIAGNOSIS — I2699 Other pulmonary embolism without acute cor pulmonale: Secondary | ICD-10-CM

## 2022-07-07 DIAGNOSIS — Z85820 Personal history of malignant melanoma of skin: Secondary | ICD-10-CM | POA: Insufficient documentation

## 2022-07-07 DIAGNOSIS — C7931 Secondary malignant neoplasm of brain: Secondary | ICD-10-CM | POA: Insufficient documentation

## 2022-07-07 LAB — CBC WITH DIFFERENTIAL/PLATELET
Abs Immature Granulocytes: 0.02 10*3/uL (ref 0.00–0.07)
Basophils Absolute: 0 10*3/uL (ref 0.0–0.1)
Basophils Relative: 1 %
Eosinophils Absolute: 0.5 10*3/uL (ref 0.0–0.5)
Eosinophils Relative: 7 %
HCT: 35.1 % — ABNORMAL LOW (ref 39.0–52.0)
Hemoglobin: 11.5 g/dL — ABNORMAL LOW (ref 13.0–17.0)
Immature Granulocytes: 0 %
Lymphocytes Relative: 14 %
Lymphs Abs: 1.1 10*3/uL (ref 0.7–4.0)
MCH: 30.5 pg (ref 26.0–34.0)
MCHC: 32.8 g/dL (ref 30.0–36.0)
MCV: 93.1 fL (ref 80.0–100.0)
Monocytes Absolute: 0.5 10*3/uL (ref 0.1–1.0)
Monocytes Relative: 7 %
Neutro Abs: 5.7 10*3/uL (ref 1.7–7.7)
Neutrophils Relative %: 71 %
Platelets: 274 10*3/uL (ref 150–400)
RBC: 3.77 MIL/uL — ABNORMAL LOW (ref 4.22–5.81)
RDW: 12.2 % (ref 11.5–15.5)
WBC: 7.9 10*3/uL (ref 4.0–10.5)
nRBC: 0 % (ref 0.0–0.2)

## 2022-07-07 LAB — COMPREHENSIVE METABOLIC PANEL
ALT: 9 U/L (ref 0–44)
AST: 20 U/L (ref 15–41)
Albumin: 3.7 g/dL (ref 3.5–5.0)
Alkaline Phosphatase: 75 U/L (ref 38–126)
Anion gap: 10 (ref 5–15)
BUN: 19 mg/dL (ref 8–23)
CO2: 26 mmol/L (ref 22–32)
Calcium: 9.2 mg/dL (ref 8.9–10.3)
Chloride: 100 mmol/L (ref 98–111)
Creatinine, Ser: 1.57 mg/dL — ABNORMAL HIGH (ref 0.61–1.24)
GFR, Estimated: 47 mL/min — ABNORMAL LOW (ref >60)
Glucose, Bld: 165 mg/dL — ABNORMAL HIGH (ref 70–99)
Potassium: 3.3 mmol/L — ABNORMAL LOW (ref 3.5–5.1)
Sodium: 136 mmol/L (ref 135–145)
Total Bilirubin: 0.3 mg/dL (ref 0.3–1.2)
Total Protein: 7.1 g/dL (ref 6.5–8.1)

## 2022-07-07 MED ORDER — SODIUM CHLORIDE 0.9 % IV SOLN
200.0000 mg | Freq: Once | INTRAVENOUS | Status: AC
  Administered 2022-07-07: 200 mg via INTRAVENOUS
  Filled 2022-07-07: qty 8

## 2022-07-07 MED ORDER — SODIUM CHLORIDE 0.9 % IV SOLN
Freq: Once | INTRAVENOUS | Status: AC
  Filled 2022-07-07: qty 250

## 2022-07-07 MED ORDER — MEGESTROL ACETATE 40 MG PO TABS: 40.0000 mg | ORAL_TABLET | Freq: Every day | ORAL | 0 refills | Status: DC

## 2022-07-07 MED ORDER — HEPARIN SOD (PORK) LOCK FLUSH 100 UNIT/ML IV SOLN
500.0000 [IU] | Freq: Once | INTRAVENOUS | Status: AC | PRN
  Administered 2022-07-07: 500 [IU]
  Filled 2022-07-07: qty 5

## 2022-07-07 NOTE — Assessment & Plan Note (Signed)
normal cortisol and ACTH  Continue to monitor.  off BP meds. Improved BP

## 2022-07-07 NOTE — Assessment & Plan Note (Addendum)
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  PET scan images were reviewed with patient.- good partial response.  Hold maintenance Alimta/Keytruda  -dose reduce Alimta 350mg /m2, Alimta was discontinued due to poor PS Labs are reviewed and discussed with patient. Proceed with Keytruda  PET scan results were available after his visit today. Interval development of small focus of hypermetabolic within the left upper lobe scar as well as small 5 mm perifissural nodule, both has hypermetabolic activities, new since last PET scan.  Suspicious for local recurrence.  No evidence of distant metastasis.  Similar experience of bone lesions.  Focal hypermetabolic 7 associated with the fracture of the right L4 transverse process.  Most likely degenerative disease. I also discussed with Dr. Rushie Chestnut to see if he is eligible for additional radiation

## 2022-07-07 NOTE — Assessment & Plan Note (Signed)
Previous MRI brain shows persistent microbleeds.   Off Eliquis 2.5mg BID.  

## 2022-07-07 NOTE — Assessment & Plan Note (Signed)
Recommend patient to follow-up with neurology. 

## 2022-07-07 NOTE — Assessment & Plan Note (Addendum)
Recommend Zometa every 4- 6 weeks.-.  Continue calcium supplementation.  MRI lumbar with and without contrast showed  Stable remote superior endplate fracture of L2 and large Schmorl to noted involving L5. 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 PET scan shows stable bone lesions.  L4 fracture uptake likely benign

## 2022-07-07 NOTE — Assessment & Plan Note (Signed)
Recommend nutritional supplementation.  Add Megace 40mg  daily. Rationale and side effects were reviewed with patient.

## 2022-07-07 NOTE — Progress Notes (Signed)
Hematology/Oncology Progress note [phone] [phone]       CHIEF COMPLAINTS/REASON FOR VISIT:  Metastatic non-small cell lung cancer  ASSESSMENT & PLAN:   Cancer Staging  Primary non-small cell carcinoma of upper lobe of left lung (HCC) Staging form: Lung, AJCC 8th Edition - Clinical: Stage IV (cT2b, cN1, cM1) - Signed by [name], MD on 08/06/2021   Primary non-small cell carcinoma of upper lobe of left lung (HCC) Stage IV lung adenocarcinoma with brain and bone metastasis.  PET scan images were reviewed with patient.- good partial response.  Hold maintenance Alimta/Keytruda  -dose reduce Alimta 350mg /m2, Alimta was discontinued due to poor PS Labs are reviewed and discussed with patient. Proceed with Keytruda  PET scan results were available after his visit today. Interval development of small focus of hypermetabolic within the left upper lobe scar as well as small 5 mm perifissural nodule, both has hypermetabolic activities, new since last PET scan.  Suspicious for local recurrence.  No evidence of distant metastasis.  Similar experience of bone lesions.  Focal hypermetabolic 7 associated with the fracture of the right L4 transverse process.  Most likely degenerative disease. I also discussed with Dr. Rushie Chestnut to see if he is eligible for additional radiation  Pulmonary embolus [facility]) Previous MRI brain shows persistent microbleeds.   Off Eliquis 2.5mg  BID.   Neoplasm related pain Back pain, MRI lumbar showed compression fracture Continue PRN percocet 7.5mg /325mg , fentanyl patch Q72 hours MRI lumbar showed DJD, L4-L5 synovial cyst, no new bone mets.  Patient gets epidural steroid injections.    Hypotension normal cortisol and ACTH  Continue to monitor.  off BP meds. Improved BP  Encounter for antineoplastic immunotherapy Immunotherapy plan as listed above  Weakness of left lower extremity Recommend patient to follow-up with  neurology.  Metastasis to bone (HCC) Recommend Zometa every 4- 6 weeks.-.  Continue calcium supplementation.  MRI lumbar with and without contrast showed  Stable remote superior endplate fracture of L2 and large Schmorl to noted involving L5. 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 PET scan shows stable bone lesions.  L4 fracture uptake likely benign  Intraparenchymal hemorrhage of brain (HCC) Hold Eliquis.  Follow-up with Dr. Barbaraann Cao Repeat MRI brain-stable findings  Weight loss Recommend nutritional supplementation.  Add Megace 40mg  daily. Rationale and side effects were reviewed with patient.      Follow up 3 weeks lab MD Keytruda. All questions were answered. The patient knows to call the clinic with any problems, questions or concerns.  [name], MD, [name] [facility] Hematology Oncology 07/07/2022      HISTORY OF PRESENTING ILLNESS:   Micheal Donovan is a  72 y.o.  male presents for follow up of Non-small cell lung cancer.  Oncology History Overview Note  Diagnosis: Stage IIB T2b N1 M0 adenocarcinoma of the LUL, poorly differentiated     Primary non-small cell carcinoma of upper lobe of left lung (HCC)  06/13/2021 Initial Diagnosis   Primary non-small cell carcinoma of upper lobe of left lung [facility]) -06/20/2021 - 06/27/2021, patient presented to [facility] due to progressive headache/dizziness/gait changes. CT head showed acute to subacute intraparenchymal hemorrhage involving the left cerebellum.  Surrounding low-density vasogenic edema.  Patient was transferred to [facility]. This was further evaluated by CT angiogram of the neck which showed bulky calcified plaques/stenosis of carotid artery, Followed by MRI brain. 06/12/2021, MRI of the brain showed no significant interval change  in size of the left cerebellar intraparenchymal hematoma with unchanged regional mass effect and partial effacement  of fourth ventricle but no upstream hydrocephalus. There is no discernible enhancement to suggest underlying mass lesion, though acute blood products could mask enhancement.   06/12/2021 a chest x-ray showed a 4.4 cm left middle lobe. 06/13/2021, CT chest with contrast showed a 4.3 x 3.6 x 3.3 cm lobular spiculated mass in the posterior left upper lobe with T3 to the lateral pleura and major fissure.  Metastatic left hilar lymphadenopathy.  Peripheral micronodularity posterior right costophrenic sulcus.  Aortic atherosclerosis. 06/14/2021, CT abdomen pelvis showed right lower lobe pulmonary artery embolus.  No evidence of right heart strain.  No acute intra-abdominal or pelvic pathology.  Aortic atherosclerosis.  06/13/2021, patient underwent bronchoscopy with EBUS by Dr. Katrinka Blazing.  Biopsy from the fine-needle aspiration of station 11 mL showed malignant cells, consistent with poorly differentiated non-small cell carcinoma, consistent with adenocarcinoma.  Malignant cells are TTF-1 positive and negative for p40.  Negative for neuroendocrine markers.  Blood test and tissue sample were tested for Gardant 360  -PD-L1 TPS 97%, no actionable mutation on the blood testing. 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   07/17/2021 Cancer Staging   Staging form: Lung, AJCC 8th Edition - Clinical: Stage IV (cT2b, cN1, cM1) - Signed by [name], MD on 08/06/2021   08/06/2021 Imaging   I saw patient's PET scan after his visit with me on 08/06/21. PET scan was ordered by his previous oncologist group and did not come to my in basket.. Patient received cycle 1 carboplatin Taxol today.  He has started on radiation.  5/26/3 PET scan showed 3.8 cm hypermetabolic left upper lobe mass with hypermetabolic left hilar and infrahilar adenopathy.  There is approximately 8 scattered metastatic lesions in the skeleton. Mixed density photopenic lesion in the left cerebellum. characterized as hemorrhage on recent prior imaging  workups.    08/16/2021 -  Chemotherapy   Patient is on Treatment Plan : LUNG Carboplatin (5) + Pemetrexed + Pembrolizumab (200) D1 q21d Induction x 4 cycles / Maintenance Pemetrexed + Pembrolizumab (200) D1 q21d      08/22/2021 Imaging   MRI brain w wo contrast  Decrease in size of left cerebellar hematoma with resolution of edema. No evidence of underlying lesion. Smaller, more recent hemorrhage in the left cerebellar vermis with minimal edema. There is minimal enhancement without definite evidence of underlying lesion.  New punctate focus of chronic blood products and enhancement in the left frontal lobe. Additional new foci of chronic blood products in the posterior right putamen, right parietal subcortical white matter, and posterior right cerebellum. Unclear at this time if these represent foci of bland hemorrhage or early metastases.   Increase in size of right parietal osseous metastasis with minor extraosseous extension.     Imaging   PET scan showed 1. Interval response to therapy as evidenced by a small residual left upper lobe nodule with decreased hypermetabolism, no residual hypermetabolic adenopathy and decreased hypermetabolism associatedwith osseous metastases. 2. 6 mm posterior left upper lobe nodule, likely stable. Recommend attention on follow-up. 3. Aortic atherosclerosis (ICD10-I70.0). Coronary artery calcification.   12/17/2021 Imaging   MRI lumbar spine w wo contrast  1. No evidence of regional metastatic disease. 2. Late subacute superior endplate fracture at L2 and large superior endplate Schmorl's node at L5, unchanged since the PET scan of 10/29/2021. 3. L3-4: Shallow disc protrusion. Facet and ligamentous hypertrophy. Stenosis of both lateral recesses. Findings slightly  worsened since 2022. 4. L4-5: Shallow disc protrusion. Facet and ligamentous hypertrophy. Small synovial cyst arising from the facet joint on the right. Stenosis of the lateral recesses right  worse than left. Foraminal narrowing right worse than left. Findings have worsened since 2022.  5. L5-S1: Endplate osteophytes and shallow protrusion of the disc. Facet and ligamentous hypertrophy. Stenosis of the subarticular lateral recesses and neural foramina, right worse than left. Similar appearance to the study of 2022. 6. Continued evidence of enteritis of at least 1 loop of small bowel. Distended bladder present   12/18/2021 Imaging   Brian MRI w wo  1. Interval decrease in size of the left cerebellar and vermian hematomas. No definite evidence of underlying metastatic lesion. 2. There is are two possible contrast enhancing lesions in the posterior left frontal lobe and left occipital lobe, which do not have intrinsic T1 signal abnormality, but have a somewhat linear appearance and may be vascular in nature. Recommend attention on follow up. 3. Multifocal sites of susceptibility artifact are redemonstrated with interval development of a few new foci, as described above. All of these sites demonstrate intrinsic T1 hyperintense signal abnormality and susceptibility artifact and are compatible with sites of microhemorrhages. Recommend continued attention on follow up. 4. Interval decrease in size of right parietal calvarial metastatic lesion. No new contrast enhancing lesions visualized. 5. Diffusely heterogeneous marrow signal throughout the cervical spine, which is nonspecific but can be seen in the setting of anemia, smoking, obesity, or a marrow replacement process.     12/18/2021 Imaging   MRI lumbar spine w wo contrast  1. No evidence of regional metastatic disease. 2. Late subacute superior endplate fracture at L2 and large superior endplate Schmorl's node at L5, unchanged since the PET scan of 10/29/2021 3. L3-4: Shallow disc protrusion. Facet and ligamentous hypertrophy.Stenosis of both lateral recesses. Findings slightly worsened since 2022. 4. L4-5: Shallow disc protrusion. Facet  and ligamentous hypertrophy. Small synovial cyst arising from the facet joint on the right. Stenosis of the lateral recesses right worse than left. Foraminal narrowing right worse than left. Findings have worsened since 2022. 5. L5-S1: Endplate osteophytes and shallow protrusion of the disc. Facet and ligamentous hypertrophy. Stenosis of the subarticular lateral recesses and neural foramina, right worse than left. Similar appearance to the study of 2022. 6. Continued evidence of enteritis of at least 1 loop of small bowel. Distended bladder present.       01/28/2022 Imaging   CT chest abdomen pelvis w contrast 1. Treated left upper lobe mass appears grossly stable to the prior examination when measured in a similar fashion on the prior study. No definite signs of extra skeletal metastatic disease noted in the chest, abdomen or pelvis. 2. Widespread skeletal metastases redemonstrated, as above. 3. Hepatic steatosis. 4. Aortic atherosclerosis, in addition to left main and three-vessel coronary artery disease. Assessment for potential risk factor modification, dietary therapy or pharmacologic therapy may be warranted, if clinically indicated. 5. Additional incidental findings,    04/08/2022 Imaging   MRI lumbar spine w wo contrast  1. Stable remote superior endplate fracture of L2 and large Schmorl to noted involving L5. No worrisome bone lesions to suggest metastatic disease. 2. Degenerative lumbar spondylosis with multilevel disc disease and facet disease which appears relatively stable as detailed above. 3. Enlarging right-sided synovial cyst at L4-5 with progressive mass effect on the right side of the thecal sac. This could potentially be symptomatic   04/28/2022 Imaging   CT chest abdomen pelvis  wo contrast  1. Similar versus slightly decreased size of treated left upper lobe primary bronchogenic carcinoma. 2. Similar osseous metastasis. 3. No evidence of extraosseous metastatic  disease. 4. New small right pleural effusion. 5. Coronary artery atherosclerosis. Aortic Atherosclerosis   07/03/2022 Imaging   PET scan showed 1. Interval development of a small focus of intense hypermetabolism within the left upper lobe scar. This is associated with a 5 mm perifissural nodule in the left lung which is hypermetabolic. Hypermetabolism at both of these locations is new since prior PET-CT and highly suspicious for recurrent disease. 2. No evidence for hypermetabolic soft tissue metastatic disease in the neck, abdomen, or pelvis. 3. Similar appearance of sclerotic and lytic bone lesions without hypermetabolism on PET imaging. 4. Focal hypermetabolism associated with a fracture of the right L4 transverse process. The fracture was visible on the previous CT of 04/28/2022. No associated soft tissue lesion to suggest that this is pathologic in nature. Tracer accumulation on today's study is in keeping with healing fracture. There is also some minimal uptake in a prominent spur associated with the L4 superior endplate, most likely degenerative. 5.  Aortic Atherosclerosis    # Patient has a history of melanoma on his neck, treated in 2009. # History of hemorrhagic infarct left cerebellum  INTERVAL HISTORY Micheal Donovan is a 72 y.o. male who has above history reviewed by me today presents for follow up visit for management of  Stage IV lung adenocarcianoma # Pulmonary embolism, SOB is stable, not worse. Off Eliquis due to MRI findings.  + back pain, and left lower extremity weakness due to pain, on fentanyl patch Q72 hours and percocet, he sees orthopedic surgeon for epidural injections.  Accompanied by wife.  Lost weight no appetite    Review of Systems  Constitutional:  Positive for appetite change, fatigue and unexpected weight change. Negative for chills and fever.  HENT:   Negative for hearing loss and voice change.   Eyes:  Negative for eye problems and icterus.   Respiratory:  Positive for shortness of breath. Negative for chest tightness and cough.   Cardiovascular:  Positive for leg swelling. Negative for chest pain.  Gastrointestinal:  Negative for abdominal distention, abdominal pain and blood in stool.  Endocrine: Negative for hot flashes.  Genitourinary:  Negative for difficulty urinating, dysuria and frequency.   Musculoskeletal:  Positive for back pain. Negative for arthralgias.  Skin:  Negative for itching and rash.  Neurological:  Negative for extremity weakness, headaches, light-headedness and numbness.  Hematological:  Negative for adenopathy. Does not bruise/bleed easily.  Psychiatric/Behavioral:  Positive for sleep disturbance. Negative for confusion.     MEDICAL HISTORY:  Past Medical History:  Diagnosis Date   Allergy    BPH (benign prostatic hypertrophy)    Cancer (HCC)    Melanoma on Neck    2008   Carotid artery occlusion    CHF (congestive heart failure) (HCC)    Diabetes mellitus    type 2   ED (erectile dysfunction)    GERD (gastroesophageal reflux disease)    Hemorrhagic stroke (HCC) 06/2021   Hyperlipidemia    Hypertension    Lung cancer (HCC)    Neoplasm related pain    Retinopathy due to secondary DM [facility])     SURGICAL HISTORY: Past Surgical History:  Procedure Laterality Date   BRONCHIAL NEEDLE ASPIRATION BIOPSY  06/17/2021   Procedure: BRONCHIAL NEEDLE ASPIRATION BIOPSIES;  Surgeon: Lorin Glass, MD;  Location: [facility] ENDOSCOPY;  Service: Pulmonary;;   IR IMAGING GUIDED PORT INSERTION  08/05/2021   MELANOMA EXCISION  2008   Left side of neck   RADIOLOGY WITH ANESTHESIA N/A 04/05/2020   Procedure: MRI SPINE WITOUT CONTRAST;  Surgeon: Radiologist, Medication, MD;  Location: [facility] OR;  Service: Radiology;  Laterality: N/A;   RADIOLOGY WITH ANESTHESIA N/A 08/22/2021   Procedure: MRI BRAIN WITH AND WITHOUT CONTRAST  WITH ANESTHESIA;  Surgeon: Radiologist, Medication, MD;  Location: [facility] OR;  Service: Radiology;   Laterality: N/A;   RADIOLOGY WITH ANESTHESIA N/A 12/17/2021   Procedure: MRI BRAIN WITH AND WITHOUT CONTRAST WITH ANESTHESIA; MRI LUMBER WITH AND WITHOUT CONTRAST;  Surgeon: Radiologist, Medication, MD;  Location: [facility] OR;  Service: Radiology;  Laterality: N/A;   RADIOLOGY WITH ANESTHESIA N/A 04/08/2022   Procedure: MRI LUMBER SPINE WITH AND WITHOUT CONTRAST;  Surgeon: Radiologist, Medication, MD;  Location: [facility] OR;  Service: Radiology;  Laterality: N/A;   TONSILLECTOMY     VIDEO BRONCHOSCOPY WITH ENDOBRONCHIAL ULTRASOUND N/A 06/17/2021   Procedure: VIDEO BRONCHOSCOPY WITH ENDOBRONCHIAL ULTRASOUND;  Surgeon: Lorin Glass, MD;  Location: [facility] ENDOSCOPY;  Service: Pulmonary;  Laterality: N/A;    SOCIAL HISTORY: Social History   Socioeconomic History   Marital status: Married    Spouse name: Not on file   Number of children: Not on file   Years of education: Not on file   Highest education level: Not on file  Occupational History   Not on file  Tobacco Use   Smoking status: Former    Types: Cigarettes    Quit date: 07/21/1990    Years since quitting: 31.9   Smokeless tobacco: Never  Vaping Use   Vaping Use: Never used  Substance and Sexual Activity   Alcohol use: No   Drug use: No   Sexual activity: Not Currently  Other Topics Concern   Not on file  Social History Narrative   Not on file   Social Determinants of Health   Financial Resource Strain: Low Risk  (04/17/2022)   Overall Financial Resource Strain (CARDIA)    Difficulty of Paying Living Expenses: Not hard at all  Food Insecurity: No Food Insecurity (04/17/2022)   Hunger Vital Sign    Worried About Running Out of Food in the Last Year: Never true    Ran Out of Food in the Last Year: Never true  Transportation Needs: No Transportation Needs (03/27/2022)   PRAPARE - Administrator, Civil Service (Medical): No    Lack of Transportation (Non-Medical): No  Physical Activity: Inactive (03/27/2022)   Exercise Vital  Sign    Days of Exercise per Week: 0 days    Minutes of Exercise per Session: 0 min  Stress: Stress Concern Present (03/27/2022)   [employer] of Occupational Health - Occupational Stress Questionnaire    Feeling of Stress : To some extent  Social Connections: Moderately Isolated (03/27/2022)   Social Connection and Isolation Panel [NHANES]    Frequency of Communication with Friends and Family: More than three times a week    Frequency of Social Gatherings with Friends and Family: Three times a week    Attends Religious Services: Never    Active Member of Clubs or Organizations: No    Attends Banker Meetings: Never    Marital Status: Married  Catering manager Violence: Not At Risk (03/27/2022)   Humiliation, Afraid, Rape, and Kick questionnaire    Fear of Current or Ex-Partner: No    Emotionally Abused: No  Physically Abused: No    Sexually Abused: No    FAMILY HISTORY: Family History  Problem Relation Age of Onset   COPD Mother    Heart disease Father    Heart disease Brother        MI at age 54   Stroke Neg Hx     ALLERGIES:  is allergic to niaspan [niacin].  MEDICATIONS:  Current Outpatient Medications  Medication Sig Dispense Refill   ALPRAZolam (XANAX) 0.5 MG tablet Take 1 tablet (0.5 mg total) by mouth See admin instructions. Take 1 tablet 30 minutes prior to imaging procedure, may take another tablet if needed. 2 tablet 0   aspirin EC 81 MG tablet Take 1 tablet (81 mg total) by mouth daily. Swallow whole. 30 tablet 12   atorvastatin (LIPITOR) 40 MG tablet Take 1 tablet (40 mg total) by mouth daily. 60 tablet 2   Ca Phosphate-Cholecalciferol (CALCIUM WITH D3 PO) Take 1 capsule by mouth daily.     fentaNYL (DURAGESIC) 75 MCG/HR Place 1 patch onto the skin every 3 (three) days. 10 patch 0   folic acid (FOLVITE) 800 MCG tablet Take 1 tablet (800 mcg total) by mouth daily. 60 tablet 1   furosemide (LASIX) 20 MG tablet Take 1 tablet (20 mg total) by  mouth daily as needed (leg swelling or sob). 30 tablet 3   gabapentin (NEURONTIN) 300 MG capsule Take 1 capsule (300 mg total) by mouth 3 (three) times daily. 90 capsule 1   gatifloxacin (ZYMAXID) 0.5 % SOLN Place 1 drop into the left eye 4 (four) times daily.     insulin glargine (LANTUS) 100 UNIT/ML injection Inject 30 Units into the skin daily as needed. Only takes Lantus when sugar is above 150.     ketorolac (ACULAR) 0.5 % ophthalmic solution Place 1 drop into the left eye 4 (four) times daily.     megestrol (MEGACE) 40 MG tablet Take 1 tablet (40 mg total) by mouth daily. 30 tablet 0   NOVOLOG FLEXPEN 100 UNIT/ML FlexPen Inject 0-10 Units into the skin daily as needed for high blood sugar (above 200). Sliding scale     omeprazole (PRILOSEC) 20 MG capsule Take 20 mg by mouth daily.     oxyCODONE-acetaminophen (PERCOCET) 7.5-325 MG tablet Take 1 tablet by mouth every 4 (four) hours as needed for severe pain. Do not take with cough medication or other pain medication 60 tablet 0   prednisoLONE acetate (PRED FORTE) 1 % ophthalmic suspension Place 1 drop into the left eye 4 (four) times daily.     Probiotic Product (PROBIOTIC DAILY PO) Take by mouth. Physician's Choice Probiotic     tamsulosin (FLOMAX) 0.4 MG CAPS capsule TAKE 1 CAPSULE(0.4 MG) BY MOUTH DAILY 30 capsule 0   traZODone (DESYREL) 50 MG tablet Take 1 tablet (50 mg total) by mouth at bedtime as needed for sleep. 30 tablet 3   UNABLE TO FIND PLEASE CHECK FASTING BLOOD GLUCOSE EVERY MORNING 1 each 0   No current facility-administered medications for this visit.   Facility-Administered Medications Ordered in Other Visits  Medication Dose Route Frequency Provider Last Rate Last Admin   heparin lock flush 100 UNIT/ML injection              PHYSICAL EXAMINATION:  Vitals:   07/07/22 1310  BP: 109/63  Pulse: 79  Resp: 20  Temp: 97.8 F (36.6 C)  SpO2: 100%   Filed Weights   07/07/22 1310  Weight: 136 lb 3.2 oz (  61.8 kg)     Physical Exam Constitutional:      General: He is not in acute distress.    Appearance: He is ill-appearing.  HENT:     Head: Normocephalic and atraumatic.  Eyes:     General: No scleral icterus. Cardiovascular:     Rate and Rhythm: Normal rate and regular rhythm.     Heart sounds: Normal heart sounds.  Pulmonary:     Effort: Pulmonary effort is normal. No respiratory distress.     Breath sounds: No wheezing.     Comments: Decreased breath sound bilaterally. Abdominal:     General: Bowel sounds are normal. There is no distension.     Palpations: Abdomen is soft.  Musculoskeletal:        General: No deformity. Normal range of motion.     Cervical back: Normal range of motion and neck supple.     Comments: Bilateral ankle trace edema.  Skin:    General: Skin is warm and dry.     Findings: No rash.  Neurological:     Mental Status: He is alert. Mental status is at baseline.     Cranial Nerves: No cranial nerve deficit.     Coordination: Coordination normal.  Psychiatric:        Mood and Affect: Mood normal.     LABORATORY DATA:  I have reviewed the data as listed    Latest Ref Rng & Units 07/07/2022    1:00 PM 06/16/2022   12:55 PM 05/26/2022   12:52 PM  CBC  WBC 4.0 - 10.5 K/uL 7.9  5.8  6.2   Hemoglobin 13.0 - 17.0 g/dL 54.0  98.1  9.4   Hematocrit 39.0 - 52.0 % 35.1  31.6  29.4   Platelets 150 - 400 K/uL 274  241  293       Latest Ref Rng & Units 07/07/2022    1:00 PM 06/16/2022   12:55 PM 05/26/2022   12:52 PM  CMP  Glucose 70 - 99 mg/dL 191  478  295   BUN 8 - 23 mg/dL 19  19  25    Creatinine 0.61 - 1.24 mg/dL 6.21  3.08  6.57   Sodium 135 - 145 mmol/L 136  133  132   Potassium 3.5 - 5.1 mmol/L 3.3  3.6  3.9   Chloride 98 - 111 mmol/L 100  100  101   CO2 22 - 32 mmol/L 26  25  25    Calcium 8.9 - 10.3 mg/dL 9.2  8.9  9.3   Total Protein 6.5 - 8.1 g/dL 7.1  6.7  7.2   Total Bilirubin 0.3 - 1.2 mg/dL 0.3  0.3  0.3   Alkaline Phos 38 - 126 U/L 75  64  85    AST 15 - 41 U/L 20  20  20    ALT 0 - 44 U/L 9  10  10       Iron/TIBC/Ferritin/ %Sat    Component Value Date/Time   IRON 60 02/03/2022 0958   TIBC 391 02/03/2022 0958   FERRITIN 704 (H) 02/03/2022 0958   IRONPCTSAT 15 (L) 02/03/2022 8469      RADIOGRAPHIC STUDIES: I have personally reviewed the radiological images as listed and agreed with the findings in the report. NM PET Image Restag (PS) Skull Base To Thigh  Result Date: 07/07/2022 CLINICAL DATA:  Subsequent treatment strategy for non-small cell carcinoma of the left upper lobe. No known brain and bone metastases. EXAM: NUCLEAR  MEDICINE PET SKULL BASE TO THIGH TECHNIQUE: 7.8 mCi F-18 FDG was injected intravenously. Full-ring PET imaging was performed from the skull base to thigh after the radiotracer. CT data was obtained and used for attenuation correction and anatomic localization. Fasting blood glucose: 120 mg/dl COMPARISON:  Chest abdomen pelvis CT 04/28/2022.  PET-CT 10/29/2021. FINDINGS: Mediastinal blood pool activity: SUV max  1.8 Liver activity: SUV max NA NECK: No hypermetabolic lymph nodes in the neck. Photopenia identified in the left cerebellar hemisphere, corresponding to the site of patient's known prior hemorrhage. Incidental CT findings: None. CHEST: There is a small focus of intense hypermetabolism within the patient's left upper lobe scar ( SUV max = 7.4. A 5 mm perifissural nodule visible in the left lung on image 51/4 also shows hypermetabolism with SUV max = 4.2. Both of these findings are new since prior PET-CT. No hypermetabolic left hilar or mediastinal lymphadenopathy. No other hypermetabolic lymphadenopathy in the chest. Incidental CT findings: Tiny pericardial effusion. Coronary artery calcification is evident. Mild atherosclerotic calcification is noted in the wall of the thoracic aorta. Right Port-A-Cath tip is positioned in the upper right atrium dependent atelectasis noted in the lung bases. ABDOMEN/PELVIS: No  abnormal hypermetabolic activity within the liver, pancreas, adrenal glands, or spleen. No hypermetabolic lymph nodes in the abdomen or pelvis. Incidental CT findings: Small cyst noted interpolar left kidney, stable. There is advanced atherosclerotic calcification of the abdominal aorta without aneurysm. SKELETON: Scattered sclerotic and lytic bone lesions again noted without hypermetabolism on PET imaging. There is focal hypermetabolism in the right L4 transverse process associated with a fracture (see image 108/4). The fracture line was visible on the previous CT of 04/28/2022. No associated soft tissue lesion to suggest that this is pathologic in nature. There is also some minimal uptake associated with a prominent left-sided L4 superior endplate spur. Incidental CT findings: Interval healing of the lateral left seventh rib fracture seen previously. Stable left-sided sclerotic lesions at T12 and L1. L2 compression deformity again noted. IMPRESSION: 1. Interval development of a small focus of intense hypermetabolism within the left upper lobe scar. This is associated with a 5 mm perifissural nodule in the left lung which is hypermetabolic. Hypermetabolism at both of these locations is new since prior PET-CT and highly suspicious for recurrent disease. 2. No evidence for hypermetabolic soft tissue metastatic disease in the neck, abdomen, or pelvis. 3. Similar appearance of sclerotic and lytic bone lesions without hypermetabolism on PET imaging. 4. Focal hypermetabolism associated with a fracture of the right L4 transverse process. The fracture was visible on the previous CT of 04/28/2022. No associated soft tissue lesion to suggest that this is pathologic in nature. Tracer accumulation on today's study is in keeping with healing fracture. There is also some minimal uptake in a prominent spur associated with the L4 superior endplate, most likely degenerative. 5.  Aortic Atherosclerosis (ICD10-I70.0). Electronically  Signed   By: Kennith Center M.D.   On: 07/07/2022 13:58   ECHOCARDIOGRAM COMPLETE  Result Date: 06/18/2022    ECHOCARDIOGRAM REPORT   Patient Name:   Micheal Donovan Date of Exam: 06/18/2022 Medical Rec #:  [id number]     Height:       67.0 in Accession #:    [id number]    Weight:       143.2 lb Date of Birth:  11-Feb-1951     BSA:          1.755 m Patient Age:    54 years  BP:           97/51 mmHg Patient Gender: M             HR:           62 bpm. Exam Location:  [facility] Procedure: 2D Echo, 3D Echo, Cardiac Doppler, Color Doppler and Strain Analysis Indications:    R60.0 Lower extremity edema  History:        Patient has prior history of Echocardiogram examinations, most                 recent 06/19/2021. H/o pulmonary embolus, Signs/Symptoms:Edema;                 Risk Factors:Hypertension, Diabetes, Dyslipidemia and                 Chemotherapy for lung cancer. Most recent chemotherapy was                 06/16/22.  Sonographer:    Quentin Ore RDMS, RVT, RDCS Referring Phys: [id number] Takumi Din IMPRESSIONS  1. Left ventricular ejection fraction, by estimation, is 55 to 60%. The left ventricle has normal function. The left ventricle has no regional wall motion abnormalities. Left ventricular diastolic parameters are consistent with Grade I diastolic dysfunction (impaired relaxation).  2. Right ventricular systolic function is normal. The right ventricular size is normal.  3. There is no evidence of cardiac tamponade.  4. The mitral valve is normal in structure. No evidence of mitral valve regurgitation.  5. The aortic valve is tricuspid. Aortic valve regurgitation is not visualized. Aortic valve sclerosis is present, with no evidence of aortic valve stenosis.  6. The inferior vena cava is normal in size with greater than 50% respiratory variability, suggesting right atrial pressure of 3 mmHg. FINDINGS  Left Ventricle: Left ventricular ejection fraction, by estimation, is 55 to 60%. The left ventricle has normal  function. The left ventricle has no regional wall motion abnormalities. Global longitudinal strain performed but not reported based on interpreter judgement due to suboptimal tracking. The left ventricular internal cavity size was normal in size. There is no left ventricular hypertrophy. Left ventricular diastolic parameters are consistent with Grade I diastolic dysfunction (impaired relaxation). Right Ventricle: The right ventricular size is normal. No increase in right ventricular wall thickness. Right ventricular systolic function is normal. Left Atrium: Left atrial size was normal in size. Right Atrium: Right atrial size was normal in size. Pericardium: Trivial pericardial effusion is present. There is no evidence of cardiac tamponade. Mitral Valve: The mitral valve is normal in structure. No evidence of mitral valve regurgitation. Tricuspid Valve: The tricuspid valve is normal in structure. Tricuspid valve regurgitation is not demonstrated. Aortic Valve: The aortic valve is tricuspid. Aortic valve regurgitation is not visualized. Aortic valve sclerosis is present, with no evidence of aortic valve stenosis. Aortic valve mean gradient measures 2.0 mmHg. Aortic valve peak gradient measures 3.8  mmHg. Aortic valve area, by VTI measures 2.75 cm. Pulmonic Valve: The pulmonic valve was not well visualized. Pulmonic valve regurgitation is not visualized. Aorta: The aortic root is normal in size and structure. Venous: The inferior vena cava is normal in size with greater than 50% respiratory variability, suggesting right atrial pressure of 3 mmHg. IAS/Shunts: No atrial level shunt detected by color flow Doppler.  LEFT VENTRICLE PLAX 2D LVIDd:         3.05 cm      Diastology LVIDs:  3.60 cm      LV e' medial:    4.79 cm/s LV PW:         3.10 cm      LV E/e' medial:  12.3 LV IVS:        1.00 cm      LV e' lateral:   6.09 cm/s LVOT diam:     2.10 cm      LV E/e' lateral: 9.6 LV SV:         59 LV SV Index:   33 LVOT  Area:     3.46 cm                              3D Volume EF: LV Volumes (MOD)            3D EF:        48 % LV vol d, MOD A2C: 105.0 ml LV EDV:       108 ml LV vol d, MOD A4C: 105.0 ml LV ESV:       56 ml LV vol s, MOD A2C: 55.3 ml  LV SV:        52 ml LV vol s, MOD A4C: 53.7 ml LV SV MOD A2C:     49.7 ml LV SV MOD A4C:     105.0 ml LV SV MOD BP:      50.0 ml RIGHT VENTRICLE             IVC RV Basal diam:  3.10 cm     IVC diam: 1.20 cm RV S prime:     10.70 cm/s TAPSE (M-mode): 1.9 cm LEFT ATRIUM             Index        RIGHT ATRIUM           Index LA diam:        4.10 cm 2.34 cm/m   RA Area:     12.90 cm LA Vol (A2C):   39.3 ml 22.40 ml/m  RA Volume:   28.80 ml  16.41 ml/m LA Vol (A4C):   47.2 ml 26.90 ml/m LA Biplane Vol: 43.0 ml 24.51 ml/m  AORTIC VALVE                    PULMONIC VALVE AV Area (Vmax):    2.74 cm     PV Vmax:       0.74 m/s AV Area (Vmean):   2.71 cm     PV Peak grad:  2.2 mmHg AV Area (VTI):     2.75 cm AV Vmax:           97.70 cm/s AV Vmean:          64.900 cm/s AV VTI:            0.213 m AV Peak Grad:      3.8 mmHg AV Mean Grad:      2.0 mmHg LVOT Vmax:         77.20 cm/s LVOT Vmean:        50.700 cm/s LVOT VTI:          0.169 m LVOT/AV VTI ratio: 0.79  AORTA Ao Root diam: 3.20 cm Ao Arch diam: 2.4 cm MITRAL VALVE MV Area (PHT): 3.03 cm    SHUNTS MV Decel Time: 250 msec    Systemic VTI:  0.17 m MV E velocity: 58.70  cm/s  Systemic Diam: 2.10 cm MV A velocity: 94.90 cm/s MV E/A ratio:  0.62 Debbe Odea MD Electronically signed by Debbe Odea MD Signature Date/Time: 06/18/2022/5:31:18 PM    Final

## 2022-07-07 NOTE — Assessment & Plan Note (Signed)
Hold Eliquis.  Follow-up with Dr. Vaslow Repeat MRI brain-stable findings 

## 2022-07-07 NOTE — Assessment & Plan Note (Signed)
Immunotherapy plan as listed above 

## 2022-07-07 NOTE — Assessment & Plan Note (Signed)
Back pain, MRI lumbar showed compression fracture Continue PRN percocet 7.5mg/325mg, fentanyl patch 75mcg Q72 hours MRI lumbar showed DJD, L4-L5 synovial cyst, no new bone mets.  Patient gets epidural steroid injections.   

## 2022-07-07 NOTE — Patient Instructions (Signed)
[facility]  Discharge Instructions: Thank you for choosing [facility] to provide your oncology and hematology care.  If you have a lab appointment with the Cancer Center, please go directly to the Cancer Center and check in at the registration area.  Wear comfortable clothing and clothing appropriate for easy access to any Portacath or PICC line.   We strive to give you quality time with your provider. You may need to reschedule your appointment if you arrive late (15 or more minutes).  Arriving late affects you and other patients whose appointments are after yours.  Also, if you miss three or more appointments without notifying the office, you may be dismissed from the clinic at the provider's discretion.      For prescription refill requests, have your pharmacy contact our office and allow 72 hours for refills to be completed.    Today you received the following chemotherapy and/or immunotherapy agents- Keytruda      To help prevent nausea and vomiting after your treatment, we encourage you to take your nausea medication as directed.  BELOW ARE SYMPTOMS THAT SHOULD BE REPORTED IMMEDIATELY: *FEVER GREATER THAN 100.4 F (38 C) OR HIGHER *CHILLS OR SWEATING *NAUSEA AND VOMITING THAT IS NOT CONTROLLED WITH YOUR NAUSEA MEDICATION *UNUSUAL SHORTNESS OF BREATH *UNUSUAL BRUISING OR BLEEDING *URINARY PROBLEMS (pain or burning when urinating, or frequent urination) *BOWEL PROBLEMS (unusual diarrhea, constipation, pain near the anus) TENDERNESS IN MOUTH AND THROAT WITH OR WITHOUT PRESENCE OF ULCERS (sore throat, sores in mouth, or a toothache) UNUSUAL RASH, SWELLING OR PAIN  UNUSUAL VAGINAL DISCHARGE OR ITCHING   Items with * indicate a potential emergency and should be followed up as soon as possible or go to the Emergency Department if any problems should occur.  Please show the CHEMOTHERAPY ALERT CARD or IMMUNOTHERAPY ALERT CARD at check-in to  the Emergency Department and triage nurse.  Should you have questions after your visit or need to cancel or reschedule your appointment, please contact [facility] [place] [facility]  [phone] and follow the prompts.  Office hours are 8:00 a.m. to 4:30 p.m. Monday - Friday. Please note that voicemails left after 4:00 p.m. may not be returned until the following business day.  We are closed weekends and major holidays. You have access to a nurse at all times for urgent questions. Please call the main number to the clinic [phone] and follow the prompts.  For any non-urgent questions, you may also contact your provider using MyChart. We now offer e-Visits for anyone 18 and older to request care online for non-urgent symptoms. For details visit [email or web address].   Also download the MyChart app! Go to the app store, search "MyChart", open the app, select [facility], and log in with your MyChart username and password.   

## 2022-07-08 ENCOUNTER — Other Ambulatory Visit: Payer: Self-pay | Admitting: Family Medicine

## 2022-07-11 ENCOUNTER — Encounter: Payer: Self-pay | Admitting: Internal Medicine

## 2022-07-11 ENCOUNTER — Inpatient Hospital Stay (HOSPITAL_BASED_OUTPATIENT_CLINIC_OR_DEPARTMENT_OTHER): Payer: PPO | Admitting: Internal Medicine

## 2022-07-11 VITALS — BP 112/72 | HR 68 | Temp 97.0°F | Resp 16 | Wt 136.0 lb

## 2022-07-11 DIAGNOSIS — I619 Nontraumatic intracerebral hemorrhage, unspecified: Secondary | ICD-10-CM

## 2022-07-11 DIAGNOSIS — Z5112 Encounter for antineoplastic immunotherapy: Secondary | ICD-10-CM | POA: Diagnosis not present

## 2022-07-11 NOTE — Progress Notes (Signed)
Pt in for follow up, reports continues to have neuropathy pain in legs and feet as well as lower back pain.

## 2022-07-11 NOTE — Progress Notes (Signed)
[facility] [address]  [place], [place] [address] [phone]   Interval Evaluation  Date of Service: 07/11/22 Patient Name: Micheal Donovan Patient MRN: [id number] Patient DOB: 1950-04-03 Provider: Henreitta Leber, MD  Identifying Statement:  Micheal Donovan is a 72 y.o. male with Intraparenchymal hemorrhage of brain [facility])   Primary Cancer:  Oncologic History: Oncology History Overview Note  Diagnosis: Stage IIB T2b N1 M0 adenocarcinoma of the LUL, poorly differentiated     Primary non-small cell carcinoma of upper lobe of left lung (HCC)  06/13/2021 Initial Diagnosis   Primary non-small cell carcinoma of upper lobe of left lung [facility]) -06/20/2021 - 06/27/2021, patient presented to [facility] due to progressive headache/dizziness/gait changes. CT head showed acute to subacute intraparenchymal hemorrhage involving the left cerebellum.  Surrounding low-density vasogenic edema.  Patient was transferred to [facility]. This was further evaluated by CT angiogram of the neck which showed bulky calcified plaques/stenosis of carotid artery, Followed by MRI brain. 06/12/2021, MRI of the brain showed no significant interval change in size of the left cerebellar intraparenchymal hematoma with unchanged regional mass effect and partial effacement of fourth ventricle but no upstream hydrocephalus. There is no discernible enhancement to suggest underlying mass lesion, though acute blood products could mask enhancement.   06/12/2021 a chest x-ray showed a 4.4 cm left middle lobe. 06/13/2021, CT chest with contrast showed a 4.3 x 3.6 x 3.3 cm lobular spiculated mass in the posterior left upper lobe with T3 to the lateral pleura and major fissure.  Metastatic left hilar lymphadenopathy.  Peripheral micronodularity posterior right costophrenic sulcus.  Aortic atherosclerosis. 06/14/2021, CT abdomen pelvis showed right lower lobe pulmonary artery embolus.  No  evidence of right heart strain.  No acute intra-abdominal or pelvic pathology.  Aortic atherosclerosis.  06/13/2021, patient underwent bronchoscopy with EBUS by Dr. Katrinka Blazing.  Biopsy from the fine-needle aspiration of station 11 mL showed malignant cells, consistent with poorly differentiated non-small cell carcinoma, consistent with adenocarcinoma.  Malignant cells are TTF-1 positive and negative for p40.  Negative for neuroendocrine markers.  Blood test and tissue sample were tested for Gardant 360  -PD-L1 TPS 97%, no actionable mutation on the blood testing. 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   07/17/2021 Cancer Staging   Staging form: Lung, AJCC 8th Edition - Clinical: Stage IV (cT2b, cN1, cM1) - Signed by [name], MD on 08/06/2021   08/06/2021 Imaging   I saw patient's PET scan after his visit with me on 08/06/21. PET scan was ordered by his previous oncologist group and did not come to my in basket.. Patient received cycle 1 carboplatin Taxol today.  He has started on radiation.  5/26/3 PET scan showed 3.8 cm hypermetabolic left upper lobe mass with hypermetabolic left hilar and infrahilar adenopathy.  There is approximately 8 scattered metastatic lesions in the skeleton. Mixed density photopenic lesion in the left cerebellum. characterized as hemorrhage on recent prior imaging workups.    08/16/2021 -  Chemotherapy   Patient is on Treatment Plan : LUNG Carboplatin (5) + Pemetrexed + Pembrolizumab (200) D1 q21d Induction x 4 cycles / Maintenance Pemetrexed + Pembrolizumab (200) D1 q21d      08/22/2021 Imaging   MRI brain w wo contrast  Decrease in size of left cerebellar hematoma with resolution of edema. No evidence of underlying lesion. Smaller, more recent hemorrhage in the left cerebellar vermis with minimal edema. There is minimal enhancement without definite  evidence of underlying lesion.  New punctate focus of chronic blood products and enhancement in the left frontal  lobe. Additional new foci of chronic blood products in the posterior right putamen, right parietal subcortical white matter, and posterior right cerebellum. Unclear at this time if these represent foci of bland hemorrhage or early metastases.   Increase in size of right parietal osseous metastasis with minor extraosseous extension.     Imaging   PET scan showed 1. Interval response to therapy as evidenced by a small residual left upper lobe nodule with decreased hypermetabolism, no residual hypermetabolic adenopathy and decreased hypermetabolism associatedwith osseous metastases. 2. 6 mm posterior left upper lobe nodule, likely stable. Recommend attention on follow-up. 3. Aortic atherosclerosis (ICD10-I70.0). Coronary artery calcification.   12/17/2021 Imaging   MRI lumbar spine w wo contrast  1. No evidence of regional metastatic disease. 2. Late subacute superior endplate fracture at L2 and large superior endplate Schmorl's node at L5, unchanged since the PET scan of 10/29/2021. 3. L3-4: Shallow disc protrusion. Facet and ligamentous hypertrophy. Stenosis of both lateral recesses. Findings slightly worsened since 2022. 4. L4-5: Shallow disc protrusion. Facet and ligamentous hypertrophy. Small synovial cyst arising from the facet joint on the right. Stenosis of the lateral recesses right worse than left. Foraminal narrowing right worse than left. Findings have worsened since 2022.  5. L5-S1: Endplate osteophytes and shallow protrusion of the disc. Facet and ligamentous hypertrophy. Stenosis of the subarticular lateral recesses and neural foramina, right worse than left. Similar appearance to the study of 2022. 6. Continued evidence of enteritis of at least 1 loop of small bowel. Distended bladder present   12/18/2021 Imaging   Brian MRI w wo  1. Interval decrease in size of the left cerebellar and vermian hematomas. No definite evidence of underlying metastatic lesion. 2. There is are two  possible contrast enhancing lesions in the posterior left frontal lobe and left occipital lobe, which do not have intrinsic T1 signal abnormality, but have a somewhat linear appearance and may be vascular in nature. Recommend attention on follow up. 3. Multifocal sites of susceptibility artifact are redemonstrated with interval development of a few new foci, as described above. All of these sites demonstrate intrinsic T1 hyperintense signal abnormality and susceptibility artifact and are compatible with sites of microhemorrhages. Recommend continued attention on follow up. 4. Interval decrease in size of right parietal calvarial metastatic lesion. No new contrast enhancing lesions visualized. 5. Diffusely heterogeneous marrow signal throughout the cervical spine, which is nonspecific but can be seen in the setting of anemia, smoking, obesity, or a marrow replacement process.     12/18/2021 Imaging   MRI lumbar spine w wo contrast  1. No evidence of regional metastatic disease. 2. Late subacute superior endplate fracture at L2 and large superior endplate Schmorl's node at L5, unchanged since the PET scan of 10/29/2021 3. L3-4: Shallow disc protrusion. Facet and ligamentous hypertrophy.Stenosis of both lateral recesses. Findings slightly worsened since 2022. 4. L4-5: Shallow disc protrusion. Facet and ligamentous hypertrophy. Small synovial cyst arising from the facet joint on the right. Stenosis of the lateral recesses right worse than left. Foraminal narrowing right worse than left. Findings have worsened since 2022. 5. L5-S1: Endplate osteophytes and shallow protrusion of the disc. Facet and ligamentous hypertrophy. Stenosis of the subarticular lateral recesses and neural foramina, right worse than left. Similar appearance to the study of 2022. 6. Continued evidence of enteritis of at least 1 loop of small bowel. Distended bladder present.  01/28/2022 Imaging   CT chest abdomen pelvis w  contrast 1. Treated left upper lobe mass appears grossly stable to the prior examination when measured in a similar fashion on the prior study. No definite signs of extra skeletal metastatic disease noted in the chest, abdomen or pelvis. 2. Widespread skeletal metastases redemonstrated, as above. 3. Hepatic steatosis. 4. Aortic atherosclerosis, in addition to left main and three-vessel coronary artery disease. Assessment for potential risk factor modification, dietary therapy or pharmacologic therapy may be warranted, if clinically indicated. 5. Additional incidental findings,    04/08/2022 Imaging   MRI lumbar spine w wo contrast  1. Stable remote superior endplate fracture of L2 and large Schmorl to noted involving L5. No worrisome bone lesions to suggest metastatic disease. 2. Degenerative lumbar spondylosis with multilevel disc disease and facet disease which appears relatively stable as detailed above. 3. Enlarging right-sided synovial cyst at L4-5 with progressive mass effect on the right side of the thecal sac. This could potentially be symptomatic   04/28/2022 Imaging   CT chest abdomen pelvis wo contrast  1. Similar versus slightly decreased size of treated left upper lobe primary bronchogenic carcinoma. 2. Similar osseous metastasis. 3. No evidence of extraosseous metastatic disease. 4. New small right pleural effusion. 5. Coronary artery atherosclerosis. Aortic Atherosclerosis   07/03/2022 Imaging   PET scan showed 1. Interval development of a small focus of intense hypermetabolism within the left upper lobe scar. This is associated with a 5 mm perifissural nodule in the left lung which is hypermetabolic. Hypermetabolism at both of these locations is new since prior PET-CT and highly suspicious for recurrent disease. 2. No evidence for hypermetabolic soft tissue metastatic disease in the neck, abdomen, or pelvis. 3. Similar appearance of sclerotic and lytic bone lesions without  hypermetabolism on PET imaging. 4. Focal hypermetabolism associated with a fracture of the right L4 transverse process. The fracture was visible on the previous CT of 04/28/2022. No associated soft tissue lesion to suggest that this is pathologic in nature. Tracer accumulation on today's study is in keeping with healing fracture. There is also some minimal uptake in a prominent spur associated with the L4 superior endplate, most likely degenerative. 5.  Aortic Atherosclerosis      Interval History of Present Illness: Micheal Donovan presents today for follow up after recent MRI brain.  He describes no new or progressive neurologic deficits.  Remains with imbalance and poor coordination of left leg as prior, worsened lately by pain in his hip and lower back.  Walking mainly with a walker.  Memory issues persist.  Continues on Keytruda with Dr Cathie Hoops.  H+P (02/21/22) Patient presents for evaluation today for stroke history.  He presented initially in April 2023 with episode of sudden onset dizziness, inability to walk.  Following workup, he returned gradually to baseline functional/gait status after 2 months of intense rehab.  Lung cancer was also diagnosed in this time period, in addition to pulmonary embolism.  He was discharged on low dose Eliquis, but his has been discontinued by Dr. Cathie Hoops given ongoing CNS blood products.  Aside from recent issues with lower legs, swelling and rash, his gait and functional status have been independent.  He is not taking aspirin, statin, or blood pressure medications currently.  Medications: Current Outpatient Medications on File Prior to Visit  Medication Sig Dispense Refill   aspirin EC 81 MG tablet Take 1 tablet (81 mg total) by mouth daily. Swallow whole. 30 tablet 12   atorvastatin (LIPITOR)  40 MG tablet Take 1 tablet (40 mg total) by mouth daily. 60 tablet 2   Ca Phosphate-Cholecalciferol (CALCIUM WITH D3 PO) Take 1 capsule by mouth daily.     fentaNYL (DURAGESIC)  75 MCG/HR Place 1 patch onto the skin every 3 (three) days. 10 patch 0   folic acid (FOLVITE) 800 MCG tablet Take 1 tablet (800 mcg total) by mouth daily. 60 tablet 1   furosemide (LASIX) 20 MG tablet Take 1 tablet (20 mg total) by mouth daily as needed (leg swelling or sob). 30 tablet 3   gabapentin (NEURONTIN) 300 MG capsule Take 1 capsule (300 mg total) by mouth 3 (three) times daily. 90 capsule 1   gatifloxacin (ZYMAXID) 0.5 % SOLN Place 1 drop into the left eye 4 (four) times daily.     insulin glargine (LANTUS) 100 UNIT/ML injection Inject 30 Units into the skin daily as needed. Only takes Lantus when sugar is above 150.     ketorolac (ACULAR) 0.5 % ophthalmic solution Place 1 drop into the left eye 4 (four) times daily.     NOVOLOG FLEXPEN 100 UNIT/ML FlexPen Inject 0-10 Units into the skin daily as needed for high blood sugar (above 200). Sliding scale     omeprazole (PRILOSEC) 20 MG capsule Take 20 mg by mouth daily.     oxyCODONE-acetaminophen (PERCOCET) 7.5-325 MG tablet Take 1 tablet by mouth every 4 (four) hours as needed for severe pain. Do not take with cough medication or other pain medication 60 tablet 0   prednisoLONE acetate (PRED FORTE) 1 % ophthalmic suspension Place 1 drop into the left eye 4 (four) times daily.     Probiotic Product (PROBIOTIC DAILY PO) Take by mouth. Physician's Choice Probiotic     tamsulosin (FLOMAX) 0.4 MG CAPS capsule TAKE 1 CAPSULE(0.4 MG) BY MOUTH DAILY 30 capsule 0   traZODone (DESYREL) 50 MG tablet Take 1 tablet (50 mg total) by mouth at bedtime as needed for sleep. 30 tablet 3   UNABLE TO FIND PLEASE CHECK FASTING BLOOD GLUCOSE EVERY MORNING 1 each 0   megestrol (MEGACE) 40 MG tablet Take 1 tablet (40 mg total) by mouth daily. (Patient not taking: Reported on 07/11/2022) 30 tablet 0   [DISCONTINUED] prochlorperazine (COMPAZINE) 10 MG tablet Take 1 tablet (10 mg total) by mouth every 6 (six) hours as needed (Nausea or vomiting). 30 tablet 1   Current  Facility-Administered Medications on File Prior to Visit  Medication Dose Route Frequency Provider Last Rate Last Admin   heparin lock flush 100 UNIT/ML injection             Allergies:  Allergies  Allergen Reactions   Niaspan [Niacin]     FLUSHING   Past Medical History:  Past Medical History:  Diagnosis Date   Allergy    BPH (benign prostatic hypertrophy)    Cancer (HCC)    Melanoma on Neck    2008   Carotid artery occlusion    CHF (congestive heart failure) (HCC)    Diabetes mellitus    type 2   ED (erectile dysfunction)    GERD (gastroesophageal reflux disease)    Hemorrhagic stroke (HCC) 06/2021   Hyperlipidemia    Hypertension    Lung cancer (HCC)    Neoplasm related pain    Retinopathy due to secondary DM [facility])    Past Surgical History:  Past Surgical History:  Procedure Laterality Date   BRONCHIAL NEEDLE ASPIRATION BIOPSY  06/17/2021   Procedure: BRONCHIAL NEEDLE ASPIRATION  BIOPSIES;  Surgeon: Lorin Glass, MD;  Location: [facility] ENDOSCOPY;  Service: Pulmonary;;   IR IMAGING GUIDED PORT INSERTION  08/05/2021   MELANOMA EXCISION  2008   Left side of neck   RADIOLOGY WITH ANESTHESIA N/A 04/05/2020   Procedure: MRI SPINE WITOUT CONTRAST;  Surgeon: Radiologist, Medication, MD;  Location: [facility] OR;  Service: Radiology;  Laterality: N/A;   RADIOLOGY WITH ANESTHESIA N/A 08/22/2021   Procedure: MRI BRAIN WITH AND WITHOUT CONTRAST  WITH ANESTHESIA;  Surgeon: Radiologist, Medication, MD;  Location: [facility] OR;  Service: Radiology;  Laterality: N/A;   RADIOLOGY WITH ANESTHESIA N/A 12/17/2021   Procedure: MRI BRAIN WITH AND WITHOUT CONTRAST WITH ANESTHESIA; MRI LUMBER WITH AND WITHOUT CONTRAST;  Surgeon: Radiologist, Medication, MD;  Location: [facility] OR;  Service: Radiology;  Laterality: N/A;   RADIOLOGY WITH ANESTHESIA N/A 04/08/2022   Procedure: MRI LUMBER SPINE WITH AND WITHOUT CONTRAST;  Surgeon: Radiologist, Medication, MD;  Location: [facility] OR;  Service: Radiology;  Laterality: N/A;    TONSILLECTOMY     VIDEO BRONCHOSCOPY WITH ENDOBRONCHIAL ULTRASOUND N/A 06/17/2021   Procedure: VIDEO BRONCHOSCOPY WITH ENDOBRONCHIAL ULTRASOUND;  Surgeon: Lorin Glass, MD;  Location: [facility] ENDOSCOPY;  Service: Pulmonary;  Laterality: N/A;   Social History:  Social History   Socioeconomic History   Marital status: Married    Spouse name: Not on file   Number of children: Not on file   Years of education: Not on file   Highest education level: Not on file  Occupational History   Not on file  Tobacco Use   Smoking status: Former    Types: Cigarettes    Quit date: 07/21/1990    Years since quitting: 31.9   Smokeless tobacco: Never  Vaping Use   Vaping Use: Never used  Substance and Sexual Activity   Alcohol use: No   Drug use: No   Sexual activity: Not Currently  Other Topics Concern   Not on file  Social History Narrative   Not on file   Social Determinants of Health   Financial Resource Strain: Low Risk  (04/17/2022)   Overall Financial Resource Strain (CARDIA)    Difficulty of Paying Living Expenses: Not hard at all  Food Insecurity: No Food Insecurity (04/17/2022)   Hunger Vital Sign    Worried About Running Out of Food in the Last Year: Never true    Ran Out of Food in the Last Year: Never true  Transportation Needs: No Transportation Needs (03/27/2022)   PRAPARE - Administrator, Civil Service (Medical): No    Lack of Transportation (Non-Medical): No  Physical Activity: Inactive (03/27/2022)   Exercise Vital Sign    Days of Exercise per Week: 0 days    Minutes of Exercise per Session: 0 min  Stress: Stress Concern Present (03/27/2022)   [employer] of Occupational Health - Occupational Stress Questionnaire    Feeling of Stress : To some extent  Social Connections: Moderately Isolated (03/27/2022)   Social Connection and Isolation Panel [NHANES]    Frequency of Communication with Friends and Family: More than three times a week    Frequency of Social  Gatherings with Friends and Family: Three times a week    Attends Religious Services: Never    Active Member of Clubs or Organizations: No    Attends Banker Meetings: Never    Marital Status: Married  Catering manager Violence: Not At Risk (03/27/2022)   Humiliation, Afraid, Rape, and Kick questionnaire    Fear  of Current or Ex-Partner: No    Emotionally Abused: No    Physically Abused: No    Sexually Abused: No   Family History:  Family History  Problem Relation Age of Onset   COPD Mother    Heart disease Father    Heart disease Brother        MI at age 44   Stroke Neg Hx     Review of Systems: Constitutional: Doesn't report fevers, chills or abnormal weight loss Eyes: Doesn't report blurriness of vision Ears, nose, mouth, throat, and face: Doesn't report sore throat Respiratory: Doesn't report cough, dyspnea or wheezes Cardiovascular: Doesn't report palpitation, chest discomfort  Gastrointestinal:  Doesn't report nausea, constipation, diarrhea GU: Doesn't report incontinence Skin: Doesn't report skin rashes Neurological: Per HPI Musculoskeletal: Doesn't report joint pain Behavioral/Psych: Doesn't report anxiety  Physical Exam: Vitals:   07/11/22 1152  BP: 112/72  Pulse: 68  Resp: 16  Temp: (!) 97 F (36.1 C)    KPS: 80. General: Alert, cooperative, pleasant, in no acute distress Head: Normal EENT: No conjunctival injection or scleral icterus.  Lungs: Resp effort normal Cardiac: Regular rate Abdomen: Non-distended abdomen Skin: Erythema lower legs Extremities: No clubbing or edema  Neurologic Exam: Mental Status: Awake, alert, attentive to examiner. Oriented to self and environment. Language is fluent with intact comprehension.  Cranial Nerves: Visual acuity is grossly normal. Visual fields are full. Extra-ocular movements intact. No ptosis. Face is symmetric Motor: Tone and bulk are normal. Power is full in both arms and legs. Reflexes are  symmetric, no pathologic reflexes present.  Sensory: Intact to light touch Gait: Dystaxic   Labs: I have reviewed the data as listed    Component Value Date/Time   NA 136 07/07/2022 1300   K 3.3 (L) 07/07/2022 1300   CL 100 07/07/2022 1300   CO2 26 07/07/2022 1300   GLUCOSE 165 (H) 07/07/2022 1300   BUN 19 07/07/2022 1300   CREATININE 1.57 (H) 07/07/2022 1300   CREATININE 1.07 02/11/2022 1457   CALCIUM 9.2 07/07/2022 1300   PROT 7.1 07/07/2022 1300   ALBUMIN 3.7 07/07/2022 1300   AST 20 07/07/2022 1300   AST 18 07/17/2021 1345   ALT 9 07/07/2022 1300   ALT 20 07/17/2021 1345   ALKPHOS 75 07/07/2022 1300   BILITOT 0.3 07/07/2022 1300   BILITOT 0.6 07/17/2021 1345   GFRNONAA 47 (L) 07/07/2022 1300   GFRNONAA >60 07/17/2021 1345   GFRNONAA 88 03/05/2016 0824   GFRAA >89 03/05/2016 0824   Lab Results  Component Value Date   WBC 7.9 07/07/2022   NEUTROABS 5.7 07/07/2022   HGB 11.5 (L) 07/07/2022   HCT 35.1 (L) 07/07/2022   MCV 93.1 07/07/2022   PLT 274 07/07/2022    Imaging:  NM PET Image Restag (PS) Skull Base To Thigh  Result Date: 07/07/2022 CLINICAL DATA:  Subsequent treatment strategy for non-small cell carcinoma of the left upper lobe. No known brain and bone metastases. EXAM: NUCLEAR MEDICINE PET SKULL BASE TO THIGH TECHNIQUE: 7.8 mCi F-18 FDG was injected intravenously. Full-ring PET imaging was performed from the skull base to thigh after the radiotracer. CT data was obtained and used for attenuation correction and anatomic localization. Fasting blood glucose: 120 mg/dl COMPARISON:  Chest abdomen pelvis CT 04/28/2022.  PET-CT 10/29/2021. FINDINGS: Mediastinal blood pool activity: SUV max  1.8 Liver activity: SUV max NA NECK: No hypermetabolic lymph nodes in the neck. Photopenia identified in the left cerebellar hemisphere, corresponding to the site  of patient's known prior hemorrhage. Incidental CT findings: None. CHEST: There is a small focus of intense  hypermetabolism within the patient's left upper lobe scar ( SUV max = 7.4. A 5 mm perifissural nodule visible in the left lung on image 51/4 also shows hypermetabolism with SUV max = 4.2. Both of these findings are new since prior PET-CT. No hypermetabolic left hilar or mediastinal lymphadenopathy. No other hypermetabolic lymphadenopathy in the chest. Incidental CT findings: Tiny pericardial effusion. Coronary artery calcification is evident. Mild atherosclerotic calcification is noted in the wall of the thoracic aorta. Right Port-A-Cath tip is positioned in the upper right atrium dependent atelectasis noted in the lung bases. ABDOMEN/PELVIS: No abnormal hypermetabolic activity within the liver, pancreas, adrenal glands, or spleen. No hypermetabolic lymph nodes in the abdomen or pelvis. Incidental CT findings: Small cyst noted interpolar left kidney, stable. There is advanced atherosclerotic calcification of the abdominal aorta without aneurysm. SKELETON: Scattered sclerotic and lytic bone lesions again noted without hypermetabolism on PET imaging. There is focal hypermetabolism in the right L4 transverse process associated with a fracture (see image 108/4). The fracture line was visible on the previous CT of 04/28/2022. No associated soft tissue lesion to suggest that this is pathologic in nature. There is also some minimal uptake associated with a prominent left-sided L4 superior endplate spur. Incidental CT findings: Interval healing of the lateral left seventh rib fracture seen previously. Stable left-sided sclerotic lesions at T12 and L1. L2 compression deformity again noted. IMPRESSION: 1. Interval development of a small focus of intense hypermetabolism within the left upper lobe scar. This is associated with a 5 mm perifissural nodule in the left lung which is hypermetabolic. Hypermetabolism at both of these locations is new since prior PET-CT and highly suspicious for recurrent disease. 2. No evidence for  hypermetabolic soft tissue metastatic disease in the neck, abdomen, or pelvis. 3. Similar appearance of sclerotic and lytic bone lesions without hypermetabolism on PET imaging. 4. Focal hypermetabolism associated with a fracture of the right L4 transverse process. The fracture was visible on the previous CT of 04/28/2022. No associated soft tissue lesion to suggest that this is pathologic in nature. Tracer accumulation on today's study is in keeping with healing fracture. There is also some minimal uptake in a prominent spur associated with the L4 superior endplate, most likely degenerative. 5.  Aortic Atherosclerosis (ICD10-I70.0). Electronically Signed   By: Kennith Center M.D.   On: 07/07/2022 13:58   ECHOCARDIOGRAM COMPLETE  Result Date: 06/18/2022    ECHOCARDIOGRAM REPORT   Patient Name:   Micheal Donovan Date of Exam: 06/18/2022 Medical Rec #:  [id number]     Height:       67.0 in Accession #:    [id number]    Weight:       143.2 lb Date of Birth:  [birth year]     BSA:          1.755 m Patient Age:    71 years      BP:           97/51 mmHg Patient Gender: M             HR:           62 bpm. Exam Location:  [facility] Procedure: 2D Echo, 3D Echo, Cardiac Doppler, Color Doppler and Strain Analysis Indications:    R60.0 Lower extremity edema  History:        Patient has prior history of Echocardiogram examinations, most  recent 06/19/2021. H/o pulmonary embolus, Signs/Symptoms:Edema;                 Risk Factors:Hypertension, Diabetes, Dyslipidemia and                 Chemotherapy for lung cancer. Most recent chemotherapy was                 06/16/22.  Sonographer:    Quentin Ore RDMS, RVT, RDCS Referring Phys: [id number] ZHOU YU IMPRESSIONS  1. Left ventricular ejection fraction, by estimation, is 55 to 60%. The left ventricle has normal function. The left ventricle has no regional wall motion abnormalities. Left ventricular diastolic parameters are consistent with Grade I diastolic dysfunction (impaired  relaxation).  2. Right ventricular systolic function is normal. The right ventricular size is normal.  3. There is no evidence of cardiac tamponade.  4. The mitral valve is normal in structure. No evidence of mitral valve regurgitation.  5. The aortic valve is tricuspid. Aortic valve regurgitation is not visualized. Aortic valve sclerosis is present, with no evidence of aortic valve stenosis.  6. The inferior vena cava is normal in size with greater than 50% respiratory variability, suggesting right atrial pressure of 3 mmHg. FINDINGS  Left Ventricle: Left ventricular ejection fraction, by estimation, is 55 to 60%. The left ventricle has normal function. The left ventricle has no regional wall motion abnormalities. Global longitudinal strain performed but not reported based on interpreter judgement due to suboptimal tracking. The left ventricular internal cavity size was normal in size. There is no left ventricular hypertrophy. Left ventricular diastolic parameters are consistent with Grade I diastolic dysfunction (impaired relaxation). Right Ventricle: The right ventricular size is normal. No increase in right ventricular wall thickness. Right ventricular systolic function is normal. Left Atrium: Left atrial size was normal in size. Right Atrium: Right atrial size was normal in size. Pericardium: Trivial pericardial effusion is present. There is no evidence of cardiac tamponade. Mitral Valve: The mitral valve is normal in structure. No evidence of mitral valve regurgitation. Tricuspid Valve: The tricuspid valve is normal in structure. Tricuspid valve regurgitation is not demonstrated. Aortic Valve: The aortic valve is tricuspid. Aortic valve regurgitation is not visualized. Aortic valve sclerosis is present, with no evidence of aortic valve stenosis. Aortic valve mean gradient measures 2.0 mmHg. Aortic valve peak gradient measures 3.8  mmHg. Aortic valve area, by VTI measures 2.75 cm. Pulmonic Valve: The pulmonic  valve was not well visualized. Pulmonic valve regurgitation is not visualized. Aorta: The aortic root is normal in size and structure. Venous: The inferior vena cava is normal in size with greater than 50% respiratory variability, suggesting right atrial pressure of 3 mmHg. IAS/Shunts: No atrial level shunt detected by color flow Doppler.  LEFT VENTRICLE PLAX 2D LVIDd:         3.05 cm      Diastology LVIDs:         3.60 cm      LV e' medial:    4.79 cm/s LV PW:         3.10 cm      LV E/e' medial:  12.3 LV IVS:        1.00 cm      LV e' lateral:   6.09 cm/s LVOT diam:     2.10 cm      LV E/e' lateral: 9.6 LV SV:         59 LV SV Index:   33 LVOT Area:  3.46 cm                              3D Volume EF: LV Volumes (MOD)            3D EF:        48 % LV vol d, MOD A2C: 105.0 ml LV EDV:       108 ml LV vol d, MOD A4C: 105.0 ml LV ESV:       56 ml LV vol s, MOD A2C: 55.3 ml  LV SV:        52 ml LV vol s, MOD A4C: 53.7 ml LV SV MOD A2C:     49.7 ml LV SV MOD A4C:     105.0 ml LV SV MOD BP:      50.0 ml RIGHT VENTRICLE             IVC RV Basal diam:  3.10 cm     IVC diam: 1.20 cm RV S prime:     10.70 cm/s TAPSE (M-mode): 1.9 cm LEFT ATRIUM             Index        RIGHT ATRIUM           Index LA diam:        4.10 cm 2.34 cm/m   RA Area:     12.90 cm LA Vol (A2C):   39.3 ml 22.40 ml/m  RA Volume:   28.80 ml  16.41 ml/m LA Vol (A4C):   47.2 ml 26.90 ml/m LA Biplane Vol: 43.0 ml 24.51 ml/m  AORTIC VALVE                    PULMONIC VALVE AV Area (Vmax):    2.74 cm     PV Vmax:       0.74 m/s AV Area (Vmean):   2.71 cm     PV Peak grad:  2.2 mmHg AV Area (VTI):     2.75 cm AV Vmax:           97.70 cm/s AV Vmean:          64.900 cm/s AV VTI:            0.213 m AV Peak Grad:      3.8 mmHg AV Mean Grad:      2.0 mmHg LVOT Vmax:         77.20 cm/s LVOT Vmean:        50.700 cm/s LVOT VTI:          0.169 m LVOT/AV VTI ratio: 0.79  AORTA Ao Root diam: 3.20 cm Ao Arch diam: 2.4 cm MITRAL VALVE MV Area (PHT): 3.03 cm     SHUNTS MV Decel Time: 250 msec    Systemic VTI:  0.17 m MV E velocity: 58.70 cm/s  Systemic Diam: 2.10 cm MV A velocity: 94.90 cm/s MV E/A ratio:  0.62 Debbe Odea MD Electronically signed by Debbe Odea MD Signature Date/Time: 06/18/2022/5:31:18 PM    Final      Assessment/Plan No diagnosis found.  Micheal Donovan presents with clinical and radiographic syndrome consistent with multifocal hemorrhagic infarcts.  The primary hemorrhage in posterior fossa has metabolized over time, without apparent underlying neoplasm/metastasis.  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    We reviewed his stroke workup from April.  He has several risk factors, including hyperlipidemia, diabetes, carotid stenosis, impaired cardiac ejection fraction.    Recommended continuing Aspirin 81mg  daily and Atorvastatin 40mg  daily.  He will con't managing diabetes with insulin, diet.  We do not recommend resuming anticoagulation given recurrent hemorrhagic foci.  Several tiny enhancing lesions were visible on MRI from October; updated scan in March 2024 does not demonstrate any progression concerning for CNS metastasis.  No further imaging needed at this time.  Will otherwise con't with chemotherapy and lung cancer management with Dr. Cathie Hoops.  He will follow with orthopedics for lower back and hip problems.  We appreciate the opportunity to participate in the care of Micheal Donovan.   We ask that Micheal Donovan return to clinic as needed.  All questions were answered. The patient knows to call the clinic with any problems, questions or concerns. No barriers to learning were detected.  The total time spent in the encounter was 30 minutes and more than 50% was on counseling and review of test results   Henreitta Leber, MD Medical Director of Neuro-Oncology [facility] 07/11/22 11:50 AM

## 2022-07-14 ENCOUNTER — Telehealth: Payer: Self-pay | Admitting: *Deleted

## 2022-07-14 ENCOUNTER — Telehealth: Payer: Self-pay

## 2022-07-14 ENCOUNTER — Inpatient Hospital Stay (HOSPITAL_BASED_OUTPATIENT_CLINIC_OR_DEPARTMENT_OTHER): Payer: PPO | Admitting: Hospice and Palliative Medicine

## 2022-07-14 ENCOUNTER — Ambulatory Visit
Admission: RE | Admit: 2022-07-14 | Discharge: 2022-07-14 | Disposition: A | Discharge status: Home / Self Care | Payer: PPO | Source: Ambulatory Visit | Attending: Radiation Oncology | Admitting: Radiation Oncology

## 2022-07-14 VITALS — BP 119/81 | HR 69 | Temp 98.2°F | Resp 18 | Wt 135.0 lb

## 2022-07-14 DIAGNOSIS — I619 Nontraumatic intracerebral hemorrhage, unspecified: Secondary | ICD-10-CM

## 2022-07-14 DIAGNOSIS — C7951 Secondary malignant neoplasm of bone: Secondary | ICD-10-CM | POA: Insufficient documentation

## 2022-07-14 DIAGNOSIS — Z510 Encounter for antineoplastic radiation therapy: Secondary | ICD-10-CM | POA: Insufficient documentation

## 2022-07-14 DIAGNOSIS — Z5112 Encounter for antineoplastic immunotherapy: Secondary | ICD-10-CM | POA: Diagnosis not present

## 2022-07-14 DIAGNOSIS — C3412 Malignant neoplasm of upper lobe, left bronchus or lung: Secondary | ICD-10-CM | POA: Insufficient documentation

## 2022-07-14 NOTE — Progress Notes (Signed)
VM left. Will reschedule. 

## 2022-07-14 NOTE — Progress Notes (Signed)
Radiation Oncology Follow up Note  Name: Micheal Donovan   Date:   07/14/2022 MRN:  [id number] DOB: 1951-01-28    This 72 y.o. male presents to the clinic today for review of his PET CT scan and patient previously treated with palliative intent for stage IV adenocarcinoma of the lung.  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  Based on the stage IV nature of his disease he did not receive chest radiation.  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  He has been seen by neurosurgery and is planning to have further treatment for his back pain.  We ran a PET CT scan to rule out any events metastatic disease to his left hip and it did not show any areas of hypermetabolic activity in that region.  He did have a small focus of intense hypermetabolic activity in the left upper lobe scar and associated 5 mm perifissural nodule in the left lung which is also hypermetabolic.  Patient also has similar appearance of sclerotic and lytic bone lesions without hypermetabolic activity on PET scan.  There was focal hypermetabolic activity associated with a fracture of the right L4 transverse process.  This is most likely secondary to degenerative changes and not metastatic disease.  COMPLICATIONS OF TREATMENT: none  FOLLOW UP COMPLIANCE: keeps appointments   PHYSICAL EXAM:  BP 119/81 (Patient Position: Sitting)   Pulse 69   Temp 98.2 F (36.8 C) (Tympanic)   Resp 18   Wt 135 lb (61.2 kg)   SpO2 100%   BMI 21.14 kg/m  Well-developed well-nourished patient in NAD. HEENT reveals PERLA, EOMI, discs not visualized.  Oral  cavity is clear. No oral mucosal lesions are identified. Neck is clear without evidence of cervical or supraclavicular adenopathy. Lungs are clear to A&P. Cardiac examination is essentially unremarkable with regular rate and rhythm without murmur rub or thrill. Abdomen is benign with no organomegaly or masses noted. Motor sensory and DTR levels are equal and symmetric in the upper and lower extremities. Cranial nerves II through XII are grossly intact. Proprioception is intact. No peripheral adenopathy or edema is identified. No motor or sensory levels are noted. Crude visual fields are within normal range.  RADIOLOGY RESULTS: PET CT scan reviewed compatible with above-stated findings  PLAN: At this time I have recommended SBRT to his left lung.  Will incorporate both areas of hypermetabolic activity.  Would plan on 63 Gray in 5 fractions.  Very low side effect profile was explained to the patient.  I have personally 7 ordered CT simulation for later this week.  Patient comprehends my recommendations well.  He will continue follow-up with neurosurgery and symptom management for his left hip pain.  I would like to take this opportunity to thank you for allowing me to participate in the care of your patient.[name], MD

## 2022-07-14 NOTE — Telephone Encounter (Signed)
[id number] contacted [facility] to determine what kind of medical clearance pt needs for his upcoming procedure. Per [name], pt has not heard anything from Dr. Ethelene Hal' office re: the RSA radiofrequency neurotomy procedure.  Spoke with Dr. Ethelene Hal' nurse. Dr. Ethelene Hal just needs a letter written and faxed by from Dr. Cathie Hoops to state pt is medically cleared for procedure from an oncology standpoint. Please fax letter to [facility] and let pt know when letter is faxed.

## 2022-07-14 NOTE — Telephone Encounter (Signed)
PA request started for Micheal Donovan (Key: [id number]) [id number] for Megestrol

## 2022-07-14 NOTE — Telephone Encounter (Signed)
Clearance letter faxed to [facility], attn Dr. Ethelene Hal

## 2022-07-15 NOTE — Telephone Encounter (Signed)
Medication has been denied. Appeal form has been completed and faxed to [employer].

## 2022-07-16 ENCOUNTER — Ambulatory Visit: Admission: RE | Admit: 2022-07-16 | Payer: PPO | Source: Ambulatory Visit

## 2022-07-16 DIAGNOSIS — C3412 Malignant neoplasm of upper lobe, left bronchus or lung: Secondary | ICD-10-CM | POA: Diagnosis not present

## 2022-07-16 DIAGNOSIS — Z5112 Encounter for antineoplastic immunotherapy: Secondary | ICD-10-CM | POA: Diagnosis not present

## 2022-07-18 NOTE — Telephone Encounter (Signed)
Medication approved from 07/17/22 to 07/16/23. Notification sent to [facility].

## 2022-07-24 DIAGNOSIS — C3412 Malignant neoplasm of upper lobe, left bronchus or lung: Secondary | ICD-10-CM | POA: Diagnosis not present

## 2022-07-24 DIAGNOSIS — Z5112 Encounter for antineoplastic immunotherapy: Secondary | ICD-10-CM | POA: Diagnosis not present

## 2022-07-25 DIAGNOSIS — C3412 Malignant neoplasm of upper lobe, left bronchus or lung: Secondary | ICD-10-CM | POA: Diagnosis not present

## 2022-07-27 ENCOUNTER — Other Ambulatory Visit: Payer: Self-pay | Admitting: Oncology

## 2022-07-29 ENCOUNTER — Inpatient Hospital Stay: Payer: PPO

## 2022-07-29 ENCOUNTER — Inpatient Hospital Stay (HOSPITAL_BASED_OUTPATIENT_CLINIC_OR_DEPARTMENT_OTHER): Payer: PPO | Admitting: Oncology

## 2022-07-29 ENCOUNTER — Encounter: Payer: Self-pay | Admitting: Oncology

## 2022-07-29 VITALS — BP 109/70 | HR 81 | Temp 98.2°F | Resp 18 | Wt 137.1 lb

## 2022-07-29 DIAGNOSIS — C7951 Secondary malignant neoplasm of bone: Secondary | ICD-10-CM | POA: Diagnosis not present

## 2022-07-29 DIAGNOSIS — G893 Neoplasm related pain (acute) (chronic): Secondary | ICD-10-CM | POA: Diagnosis not present

## 2022-07-29 DIAGNOSIS — R634 Abnormal weight loss: Secondary | ICD-10-CM

## 2022-07-29 DIAGNOSIS — E876 Hypokalemia: Secondary | ICD-10-CM | POA: Diagnosis not present

## 2022-07-29 DIAGNOSIS — I2699 Other pulmonary embolism without acute cor pulmonale: Secondary | ICD-10-CM

## 2022-07-29 DIAGNOSIS — Z5112 Encounter for antineoplastic immunotherapy: Secondary | ICD-10-CM

## 2022-07-29 DIAGNOSIS — F419 Anxiety disorder, unspecified: Secondary | ICD-10-CM

## 2022-07-29 DIAGNOSIS — C3412 Malignant neoplasm of upper lobe, left bronchus or lung: Secondary | ICD-10-CM

## 2022-07-29 DIAGNOSIS — I619 Nontraumatic intracerebral hemorrhage, unspecified: Secondary | ICD-10-CM | POA: Diagnosis not present

## 2022-07-29 LAB — CBC WITH DIFFERENTIAL/PLATELET
Abs Immature Granulocytes: 0.01 10*3/uL (ref 0.00–0.07)
Basophils Absolute: 0 10*3/uL (ref 0.0–0.1)
Basophils Relative: 1 %
Eosinophils Absolute: 0.5 10*3/uL (ref 0.0–0.5)
Eosinophils Relative: 8 %
HCT: 36.1 % — ABNORMAL LOW (ref 39.0–52.0)
Hemoglobin: 11.9 g/dL — ABNORMAL LOW (ref 13.0–17.0)
Immature Granulocytes: 0 %
Lymphocytes Relative: 15 %
Lymphs Abs: 0.9 10*3/uL (ref 0.7–4.0)
MCH: 30 pg (ref 26.0–34.0)
MCHC: 33 g/dL (ref 30.0–36.0)
MCV: 90.9 fL (ref 80.0–100.0)
Monocytes Absolute: 0.5 10*3/uL (ref 0.1–1.0)
Monocytes Relative: 8 %
Neutro Abs: 4.4 10*3/uL (ref 1.7–7.7)
Neutrophils Relative %: 68 %
Platelets: 243 10*3/uL (ref 150–400)
RBC: 3.97 MIL/uL — ABNORMAL LOW (ref 4.22–5.81)
RDW: 12.7 % (ref 11.5–15.5)
WBC: 6.3 10*3/uL (ref 4.0–10.5)
nRBC: 0 % (ref 0.0–0.2)

## 2022-07-29 LAB — COMPREHENSIVE METABOLIC PANEL
ALT: 10 U/L (ref 0–44)
AST: 19 U/L (ref 15–41)
Albumin: 3.8 g/dL (ref 3.5–5.0)
Alkaline Phosphatase: 63 U/L (ref 38–126)
Anion gap: 8 (ref 5–15)
BUN: 21 mg/dL (ref 8–23)
CO2: 29 mmol/L (ref 22–32)
Calcium: 9.2 mg/dL (ref 8.9–10.3)
Chloride: 97 mmol/L — ABNORMAL LOW (ref 98–111)
Creatinine, Ser: 1.59 mg/dL — ABNORMAL HIGH (ref 0.61–1.24)
GFR, Estimated: 46 mL/min — ABNORMAL LOW (ref >60)
Glucose, Bld: 141 mg/dL — ABNORMAL HIGH (ref 70–99)
Potassium: 3.1 mmol/L — ABNORMAL LOW (ref 3.5–5.1)
Sodium: 134 mmol/L — ABNORMAL LOW (ref 135–145)
Total Bilirubin: 0.4 mg/dL (ref 0.3–1.2)
Total Protein: 7 g/dL (ref 6.5–8.1)

## 2022-07-29 LAB — TSH: TSH: 1.902 u[IU]/mL (ref 0.350–4.500)

## 2022-07-29 MED ORDER — SODIUM CHLORIDE 0.9 % IV SOLN
Freq: Once | INTRAVENOUS | Status: AC
  Filled 2022-07-29: qty 250

## 2022-07-29 MED ORDER — HEPARIN SOD (PORK) LOCK FLUSH 100 UNIT/ML IV SOLN
500.0000 [IU] | Freq: Once | INTRAVENOUS | Status: AC | PRN
  Administered 2022-07-29: 500 [IU]
  Filled 2022-07-29: qty 5

## 2022-07-29 MED ORDER — POTASSIUM CHLORIDE CRYS ER 20 MEQ PO TBCR: 20.0000 meq | EXTENDED_RELEASE_TABLET | Freq: Every day | ORAL | 0 refills | Status: DC

## 2022-07-29 MED ORDER — FENTANYL 75 MCG/HR TD PT72: 1.0000 | MEDICATED_PATCH | TRANSDERMAL | 0 refills | Status: DC

## 2022-07-29 MED ORDER — CYANOCOBALAMIN 1000 MCG/ML IJ SOLN
1000.0000 ug | Freq: Once | INTRAMUSCULAR | Status: DC
Start: 2022-07-29 — End: 2022-07-29

## 2022-07-29 MED ORDER — SODIUM CHLORIDE 0.9 % IV SOLN
200.0000 mg | Freq: Once | INTRAVENOUS | Status: AC
  Administered 2022-07-29: 200 mg via INTRAVENOUS
  Filled 2022-07-29: qty 200

## 2022-07-29 NOTE — Assessment & Plan Note (Addendum)
Recommend Zometa every 4- 6 weeks.-.  Continue calcium supplementation.  MRI lumbar with and without contrast showed  Stable remote superior endplate fracture of L2 and large Schmorl to noted involving L5. 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 PET scan shows stable bone lesions.  L4 fracture uptake likely benign- upcoming appt on 6/7 for nerve block

## 2022-07-29 NOTE — Assessment & Plan Note (Addendum)
Back pain, MRI lumbar showed compression fracture Continue PRN percocet 7.5mg /325mg , fentanyl patch Q72 hours MRI lumbar showed DJD, L4-L5 synovial cyst,PET scan showed L4 fracture

## 2022-07-29 NOTE — Assessment & Plan Note (Signed)
Previous MRI brain shows persistent microbleeds.   Off Eliquis 2.5mg BID.  

## 2022-07-29 NOTE — Assessment & Plan Note (Signed)
Due to lasix use.  Recommend potassium daily

## 2022-07-29 NOTE — Assessment & Plan Note (Signed)
Immunotherapy plan as listed above 

## 2022-07-29 NOTE — Patient Instructions (Signed)
[facility]  Discharge Instructions: Thank you for choosing [facility] to provide your oncology and hematology care.  If you have a lab appointment with the Cancer Center, please go directly to the Cancer Center and check in at the registration area.  Wear comfortable clothing and clothing appropriate for easy access to any Portacath or PICC line.   We strive to give you quality time with your provider. You may need to reschedule your appointment if you arrive late (15 or more minutes).  Arriving late affects you and other patients whose appointments are after yours.  Also, if you miss three or more appointments without notifying the office, you may be dismissed from the clinic at the provider's discretion.      For prescription refill requests, have your pharmacy contact our office and allow 72 hours for refills to be completed.    Today you received the following chemotherapy and/or immunotherapy agents- Keytruda      To help prevent nausea and vomiting after your treatment, we encourage you to take your nausea medication as directed.  BELOW ARE SYMPTOMS THAT SHOULD BE REPORTED IMMEDIATELY: *FEVER GREATER THAN 100.4 F (38 C) OR HIGHER *CHILLS OR SWEATING *NAUSEA AND VOMITING THAT IS NOT CONTROLLED WITH YOUR NAUSEA MEDICATION *UNUSUAL SHORTNESS OF BREATH *UNUSUAL BRUISING OR BLEEDING *URINARY PROBLEMS (pain or burning when urinating, or frequent urination) *BOWEL PROBLEMS (unusual diarrhea, constipation, pain near the anus) TENDERNESS IN MOUTH AND THROAT WITH OR WITHOUT PRESENCE OF ULCERS (sore throat, sores in mouth, or a toothache) UNUSUAL RASH, SWELLING OR PAIN  UNUSUAL VAGINAL DISCHARGE OR ITCHING   Items with * indicate a potential emergency and should be followed up as soon as possible or go to the Emergency Department if any problems should occur.  Please show the CHEMOTHERAPY ALERT CARD or IMMUNOTHERAPY ALERT CARD at check-in to  the Emergency Department and triage nurse.  Should you have questions after your visit or need to cancel or reschedule your appointment, please contact [facility] [place] [facility]  [phone] and follow the prompts.  Office hours are 8:00 a.m. to 4:30 p.m. Monday - Friday. Please note that voicemails left after 4:00 p.m. may not be returned until the following business day.  We are closed weekends and major holidays. You have access to a nurse at all times for urgent questions. Please call the main number to the clinic [phone] and follow the prompts.  For any non-urgent questions, you may also contact your provider using MyChart. We now offer e-Visits for anyone 18 and older to request care online for non-urgent symptoms. For details visit [email or web address].   Also download the MyChart app! Go to the app store, search "MyChart", open the app, select [facility], and log in with your MyChart username and password.   

## 2022-07-29 NOTE — Assessment & Plan Note (Signed)
Recommend nutritional supplementation.  Trials of Megace 40mg  daily. Rationale and side effects were reviewed with patient.

## 2022-07-29 NOTE — Progress Notes (Signed)
Pt here for follow up. Pt reports that port site is itchy.

## 2022-07-29 NOTE — Assessment & Plan Note (Signed)
Hold Eliquis.  Follow-up with Dr. Vaslow Repeat MRI brain-stable findings 

## 2022-07-29 NOTE — Assessment & Plan Note (Addendum)
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  PET scan images were reviewed with patient.- good partial response.  Labs are reviewed and discussed with patient. Proceed with Keytruda  PET scan results were available after his visit today. Interval development of small focus of hypermetabolic within the left upper lobe scar as well as small 5 mm perifissural nodule, both has hypermetabolic activities, new since last PET scan.  Suspicious for local recurrence.  No evidence of distant metastasis.  Similar experience of bone lesions.  Focal hypermetabolic 7 associated with the fracture of the right L4 transverse process.  Most likely degenerative disease. Follow up with radonc for radiation.   Refer to home health for PT.

## 2022-07-29 NOTE — Progress Notes (Addendum)
Hematology/Oncology Progress note [phone] [phone]       CHIEF COMPLAINTS/REASON FOR VISIT:  Metastatic non-small cell lung cancer  ASSESSMENT & PLAN:   Cancer Staging  Primary non-small cell carcinoma of upper lobe of left lung (HCC) Staging form: Lung, AJCC 8th Edition - Clinical: Stage IV (cT2b, cN1, cM1) - Signed by [name], MD on 08/06/2021   Primary non-small cell carcinoma of upper lobe of left lung (HCC) Stage IV lung adenocarcinoma with brain and bone metastasis.  PET scan images were reviewed with patient.- good partial response.  Labs are reviewed and discussed with patient. Proceed with Keytruda  PET scan results were available after his visit today. Interval development of small focus of hypermetabolic within the left upper lobe scar as well as small 5 mm perifissural nodule, both has hypermetabolic activities, new since last PET scan.  Suspicious for local recurrence.  No evidence of distant metastasis.  Similar experience of bone lesions.  Focal hypermetabolic 7 associated with the fracture of the right L4 transverse process.  Most likely degenerative disease. Follow up with radonc for radiation.   Refer to home health for PT.    Intraparenchymal hemorrhage of brain (HCC) Hold Eliquis.  Follow-up with Dr. Barbaraann Cao Repeat MRI brain-stable findings  Metastasis to bone (HCC) Recommend Zometa every 4- 6 weeks.-.  Continue calcium supplementation.  MRI lumbar with and without contrast showed  Stable remote superior endplate fracture of L2 and large Schmorl to noted involving L5. 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 PET scan shows stable bone lesions.  L4 fracture uptake likely benign- upcoming appt on 6/7 for nerve block  Neoplasm related pain Back pain, MRI lumbar showed compression fracture Continue PRN percocet 7.5mg /325mg , fentanyl patch Q72 hours MRI  lumbar showed DJD, L4-L5 synovial cyst,PET scan showed L4 fracture    Encounter for antineoplastic immunotherapy Immunotherapy plan as listed above  Weight loss Recommend nutritional supplementation.  Trials of Megace 40mg  daily. Rationale and side effects were reviewed with patient.    Pulmonary embolus (HCC) Previous MRI brain shows persistent microbleeds.   Off Eliquis 2.5mg  BID.   Hypokalemia Due to lasix use.  Recommend potassium daily     Follow up 3 weeks lab MD Keytruda. All questions were answered. The patient knows to call the clinic with any problems, questions or concerns.  [name], MD, [name] [facility] Hematology Oncology 07/29/2022      HISTORY OF PRESENTING ILLNESS:   Micheal Donovan is a  72 y.o.  male presents for follow up of Non-small cell lung cancer.  Oncology History Overview Note  Diagnosis: Stage IIB T2b N1 M0 adenocarcinoma of the LUL, poorly differentiated     Primary non-small cell carcinoma of upper lobe of left lung (HCC)  06/13/2021 Initial Diagnosis   Primary non-small cell carcinoma of upper lobe of left lung [facility]) -06/20/2021 - 06/27/2021, patient presented to [facility] due to progressive headache/dizziness/gait changes. CT head showed acute to subacute intraparenchymal hemorrhage involving the left cerebellum.  Surrounding low-density vasogenic edema.  Patient was transferred to [facility]. This was further evaluated by CT angiogram of the neck which showed bulky calcified plaques/stenosis of carotid artery, Followed by MRI brain. 06/12/2021, MRI of the brain showed no significant interval change in size of the left cerebellar intraparenchymal hematoma with unchanged regional mass effect and partial effacement of fourth ventricle but no upstream hydrocephalus. There is no discernible enhancement  to suggest underlying mass lesion, though acute blood products could mask enhancement.   06/12/2021 a chest x-ray showed a 4.4 cm left  middle lobe. 06/13/2021, CT chest with contrast showed a 4.3 x 3.6 x 3.3 cm lobular spiculated mass in the posterior left upper lobe with T3 to the lateral pleura and major fissure.  Metastatic left hilar lymphadenopathy.  Peripheral micronodularity posterior right costophrenic sulcus.  Aortic atherosclerosis. 06/14/2021, CT abdomen pelvis showed right lower lobe pulmonary artery embolus.  No evidence of right heart strain.  No acute intra-abdominal or pelvic pathology.  Aortic atherosclerosis.  06/13/2021, patient underwent bronchoscopy with EBUS by Dr. Katrinka Blazing.  Biopsy from the fine-needle aspiration of station 11 mL showed malignant cells, consistent with poorly differentiated non-small cell carcinoma, consistent with adenocarcinoma.  Malignant cells are TTF-1 positive and negative for p40.  Negative for neuroendocrine markers.  Blood test and tissue sample were tested for Gardant 360  -PD-L1 TPS 97%, no actionable mutation on the blood testing. 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   07/17/2021 Cancer Staging   Staging form: Lung, AJCC 8th Edition - Clinical: Stage IV (cT2b, cN1, cM1) - Signed by [name], MD on 08/06/2021   08/06/2021 Imaging   I saw patient's PET scan after his visit with me on 08/06/21. PET scan was ordered by his previous oncologist group and did not come to my in basket.. Patient received cycle 1 carboplatin Taxol today.  He has started on radiation.  5/26/3 PET scan showed 3.8 cm hypermetabolic left upper lobe mass with hypermetabolic left hilar and infrahilar adenopathy.  There is approximately 8 scattered metastatic lesions in the skeleton. Mixed density photopenic lesion in the left cerebellum. characterized as hemorrhage on recent prior imaging workups.    08/16/2021 -  Chemotherapy   Patient is on Treatment Plan : LUNG Carboplatin (5) + Pemetrexed + Pembrolizumab (200) D1 q21d Induction x 4 cycles / Maintenance Pemetrexed + Pembrolizumab (200) D1 q21d       08/22/2021 Imaging   MRI brain w wo contrast  Decrease in size of left cerebellar hematoma with resolution of edema. No evidence of underlying lesion. Smaller, more recent hemorrhage in the left cerebellar vermis with minimal edema. There is minimal enhancement without definite evidence of underlying lesion.  New punctate focus of chronic blood products and enhancement in the left frontal lobe. Additional new foci of chronic blood products in the posterior right putamen, right parietal subcortical white matter, and posterior right cerebellum. Unclear at this time if these represent foci of bland hemorrhage or early metastases.   Increase in size of right parietal osseous metastasis with minor extraosseous extension.     Imaging   PET scan showed 1. Interval response to therapy as evidenced by a small residual left upper lobe nodule with decreased hypermetabolism, no residual hypermetabolic adenopathy and decreased hypermetabolism associatedwith osseous metastases. 2. 6 mm posterior left upper lobe nodule, likely stable. Recommend attention on follow-up. 3. Aortic atherosclerosis (ICD10-I70.0). Coronary artery calcification.   12/17/2021 Imaging   MRI lumbar spine w wo contrast  1. No evidence of regional metastatic disease. 2. Late subacute superior endplate fracture at L2 and large superior endplate Schmorl's node at L5, unchanged since the PET scan of 10/29/2021. 3. L3-4: Shallow disc protrusion. Facet and ligamentous hypertrophy. Stenosis of both lateral recesses. Findings slightly worsened since 2022. 4. L4-5: Shallow disc protrusion. Facet and ligamentous hypertrophy. Small synovial cyst arising from the facet joint on the right. Stenosis of the lateral recesses  right worse than left. Foraminal narrowing right worse than left. Findings have worsened since 2022.  5. L5-S1: Endplate osteophytes and shallow protrusion of the disc. Facet and ligamentous hypertrophy. Stenosis of the  subarticular lateral recesses and neural foramina, right worse than left. Similar appearance to the study of 2022. 6. Continued evidence of enteritis of at least 1 loop of small bowel. Distended bladder present   12/18/2021 Imaging   Brian MRI w wo  1. Interval decrease in size of the left cerebellar and vermian hematomas. No definite evidence of underlying metastatic lesion. 2. There is are two possible contrast enhancing lesions in the posterior left frontal lobe and left occipital lobe, which do not have intrinsic T1 signal abnormality, but have a somewhat linear appearance and may be vascular in nature. Recommend attention on follow up. 3. Multifocal sites of susceptibility artifact are redemonstrated with interval development of a few new foci, as described above. All of these sites demonstrate intrinsic T1 hyperintense signal abnormality and susceptibility artifact and are compatible with sites of microhemorrhages. Recommend continued attention on follow up. 4. Interval decrease in size of right parietal calvarial metastatic lesion. No new contrast enhancing lesions visualized. 5. Diffusely heterogeneous marrow signal throughout the cervical spine, which is nonspecific but can be seen in the setting of anemia, smoking, obesity, or a marrow replacement process.     12/18/2021 Imaging   MRI lumbar spine w wo contrast  1. No evidence of regional metastatic disease. 2. Late subacute superior endplate fracture at L2 and large superior endplate Schmorl's node at L5, unchanged since the PET scan of 10/29/2021 3. L3-4: Shallow disc protrusion. Facet and ligamentous hypertrophy.Stenosis of both lateral recesses. Findings slightly worsened since 2022. 4. L4-5: Shallow disc protrusion. Facet and ligamentous hypertrophy. Small synovial cyst arising from the facet joint on the right. Stenosis of the lateral recesses right worse than left. Foraminal narrowing right worse than left. Findings have  worsened since 2022. 5. L5-S1: Endplate osteophytes and shallow protrusion of the disc. Facet and ligamentous hypertrophy. Stenosis of the subarticular lateral recesses and neural foramina, right worse than left. Similar appearance to the study of 2022. 6. Continued evidence of enteritis of at least 1 loop of small bowel. Distended bladder present.       01/28/2022 Imaging   CT chest abdomen pelvis w contrast 1. Treated left upper lobe mass appears grossly stable to the prior examination when measured in a similar fashion on the prior study. No definite signs of extra skeletal metastatic disease noted in the chest, abdomen or pelvis. 2. Widespread skeletal metastases redemonstrated, as above. 3. Hepatic steatosis. 4. Aortic atherosclerosis, in addition to left main and three-vessel coronary artery disease. Assessment for potential risk factor modification, dietary therapy or pharmacologic therapy may be warranted, if clinically indicated. 5. Additional incidental findings,    04/08/2022 Imaging   MRI lumbar spine w wo contrast  1. Stable remote superior endplate fracture of L2 and large Schmorl to noted involving L5. No worrisome bone lesions to suggest metastatic disease. 2. Degenerative lumbar spondylosis with multilevel disc disease and facet disease which appears relatively stable as detailed above. 3. Enlarging right-sided synovial cyst at L4-5 with progressive mass effect on the right side of the thecal sac. This could potentially be symptomatic   04/28/2022 Imaging   CT chest abdomen pelvis wo contrast  1. Similar versus slightly decreased size of treated left upper lobe primary bronchogenic carcinoma. 2. Similar osseous metastasis. 3. No evidence of extraosseous metastatic disease.  4. New small right pleural effusion. 5. Coronary artery atherosclerosis. Aortic Atherosclerosis   07/03/2022 Imaging   PET scan showed 1. Interval development of a small focus of intense  hypermetabolism within the left upper lobe scar. This is associated with a 5 mm perifissural nodule in the left lung which is hypermetabolic. Hypermetabolism at both of these locations is new since prior PET-CT and highly suspicious for recurrent disease. 2. No evidence for hypermetabolic soft tissue metastatic disease in the neck, abdomen, or pelvis. 3. Similar appearance of sclerotic and lytic bone lesions without hypermetabolism on PET imaging. 4. Focal hypermetabolism associated with a fracture of the right L4 transverse process. The fracture was visible on the previous CT of 04/28/2022. No associated soft tissue lesion to suggest that this is pathologic in nature. Tracer accumulation on today's study is in keeping with healing fracture. There is also some minimal uptake in a prominent spur associated with the L4 superior endplate, most likely degenerative. 5.  Aortic Atherosclerosis    # Patient has a history of melanoma on his neck, treated in 2009. # History of hemorrhagic infarct left cerebellum  INTERVAL HISTORY DENIRO QADIR is a 72 y.o. male who has above history reviewed by me today presents for follow up visit for management of  Stage IV lung adenocarcianoma # Pulmonary embolism, SOB is stable, not worse. Off Eliquis due to MRI findings.  + back pain, and left lower extremity weakness due to pain, on fentanyl patch Q72 hours and percocet, he sees orthopedic surgeon for epidural injections.  Accompanied by wife.  Medi port site itchy, no redness, pain, or discharge. No fever or chills.     Review of Systems  Constitutional:  Positive for appetite change, fatigue and unexpected weight change. Negative for chills and fever.  HENT:   Negative for hearing loss and voice change.   Eyes:  Negative for eye problems and icterus.  Respiratory:  Positive for shortness of breath. Negative for chest tightness and cough.   Cardiovascular:  Positive for leg swelling. Negative for chest  pain.  Gastrointestinal:  Negative for abdominal distention, abdominal pain and blood in stool.  Endocrine: Negative for hot flashes.  Genitourinary:  Negative for difficulty urinating, dysuria and frequency.   Musculoskeletal:  Positive for back pain. Negative for arthralgias.  Skin:  Negative for itching and rash.  Neurological:  Negative for extremity weakness, headaches, light-headedness and numbness.  Hematological:  Negative for adenopathy. Does not bruise/bleed easily.  Psychiatric/Behavioral:  Positive for sleep disturbance. Negative for confusion.     MEDICAL HISTORY:  Past Medical History:  Diagnosis Date   Allergy    BPH (benign prostatic hypertrophy)    Cancer (HCC)    Melanoma on Neck    2008   Carotid artery occlusion    CHF (congestive heart failure) (HCC)    Diabetes mellitus    type 2   ED (erectile dysfunction)    GERD (gastroesophageal reflux disease)    Hemorrhagic stroke (HCC) 06/2021   Hyperlipidemia    Hypertension    Lung cancer (HCC)    Neoplasm related pain    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)     SURGICAL HISTORY: Past Surgical History:  Procedure Laterality Date   BRONCHIAL NEEDLE ASPIRATION BIOPSY  06/17/2021   Procedure: BRONCHIAL NEEDLE ASPIRATION BIOPSIES;  Surgeon: Lorin Glass, MD;  Location: [facility] ENDOSCOPY;  Service: Pulmonary;;   IR IMAGING GUIDED PORT INSERTION  08/05/2021   MELANOMA EXCISION  2008  Left side of neck   RADIOLOGY WITH ANESTHESIA N/A 04/05/2020   Procedure: MRI SPINE WITOUT CONTRAST;  Surgeon: Radiologist, Medication, MD;  Location: [facility] OR;  Service: Radiology;  Laterality: N/A;   RADIOLOGY WITH ANESTHESIA N/A 08/22/2021   Procedure: MRI BRAIN WITH AND WITHOUT CONTRAST  WITH ANESTHESIA;  Surgeon: Radiologist, Medication, MD;  Location: [facility] OR;  Service: Radiology;  Laterality: N/A;   RADIOLOGY WITH ANESTHESIA N/A 12/17/2021   Procedure: MRI BRAIN WITH AND WITHOUT CONTRAST WITH ANESTHESIA; MRI LUMBER WITH AND WITHOUT  CONTRAST;  Surgeon: Radiologist, Medication, MD;  Location: [facility] OR;  Service: Radiology;  Laterality: N/A;   RADIOLOGY WITH ANESTHESIA N/A 04/08/2022   Procedure: MRI LUMBER SPINE WITH AND WITHOUT CONTRAST;  Surgeon: Radiologist, Medication, MD;  Location: [facility] OR;  Service: Radiology;  Laterality: N/A;   TONSILLECTOMY     VIDEO BRONCHOSCOPY WITH ENDOBRONCHIAL ULTRASOUND N/A 06/17/2021   Procedure: VIDEO BRONCHOSCOPY WITH ENDOBRONCHIAL ULTRASOUND;  Surgeon: Lorin Glass, MD;  Location: [facility] ENDOSCOPY;  Service: Pulmonary;  Laterality: N/A;    SOCIAL HISTORY: Social History   Socioeconomic History   Marital status: Married    Spouse name: Not on file   Number of children: Not on file   Years of education: Not on file   Highest education level: Not on file  Occupational History   Not on file  Tobacco Use   Smoking status: Former    Types: Cigarettes    Quit date: 07/21/1990    Years since quitting: 32.0   Smokeless tobacco: Never  Vaping Use   Vaping Use: Never used  Substance and Sexual Activity   Alcohol use: No   Drug use: No   Sexual activity: Not Currently  Other Topics Concern   Not on file  Social History Narrative   Not on file   Social Determinants of Health   Financial Resource Strain: Low Risk  (04/17/2022)   Overall Financial Resource Strain (CARDIA)    Difficulty of Paying Living Expenses: Not hard at all  Food Insecurity: No Food Insecurity (04/17/2022)   Hunger Vital Sign    Worried About Running Out of Food in the Last Year: Never true    Ran Out of Food in the Last Year: Never true  Transportation Needs: No Transportation Needs (03/27/2022)   PRAPARE - Administrator, Civil Service (Medical): No    Lack of Transportation (Non-Medical): No  Physical Activity: Inactive (03/27/2022)   Exercise Vital Sign    Days of Exercise per Week: 0 days    Minutes of Exercise per Session: 0 min  Stress: Stress Concern Present (03/27/2022)   [employer] of  Occupational Health - Occupational Stress Questionnaire    Feeling of Stress : To some extent  Social Connections: Moderately Isolated (03/27/2022)   Social Connection and Isolation Panel [NHANES]    Frequency of Communication with Friends and Family: More than three times a week    Frequency of Social Gatherings with Friends and Family: Three times a week    Attends Religious Services: Never    Active Member of Clubs or Organizations: No    Attends Banker Meetings: Never    Marital Status: Married  Catering manager Violence: Not At Risk (03/27/2022)   Humiliation, Afraid, Rape, and Kick questionnaire    Fear of Current or Ex-Partner: No    Emotionally Abused: No    Physically Abused: No    Sexually Abused: No    FAMILY HISTORY: Family History  Problem Relation Age of Onset   COPD Mother    Heart disease Father    Heart disease Brother        MI at age 53   Stroke Neg Hx     ALLERGIES:  is allergic to niaspan [niacin].  MEDICATIONS:  Current Outpatient Medications  Medication Sig Dispense Refill   aspirin EC 81 MG tablet Take 1 tablet (81 mg total) by mouth daily. Swallow whole. 30 tablet 12   atorvastatin (LIPITOR) 40 MG tablet Take 1 tablet (40 mg total) by mouth daily. 60 tablet 2   Ca Phosphate-Cholecalciferol (CALCIUM WITH D3 PO) Take 1 capsule by mouth daily.     fentaNYL (DURAGESIC) 75 MCG/HR Place 1 patch onto the skin every 3 (three) days. 10 patch 0   folic acid (FOLVITE) 800 MCG tablet Take 1 tablet (800 mcg total) by mouth daily. 60 tablet 1   furosemide (LASIX) 20 MG tablet Take 1 tablet (20 mg total) by mouth daily as needed (leg swelling or sob). 30 tablet 3   gabapentin (NEURONTIN) 300 MG capsule Take 1 capsule (300 mg total) by mouth 3 (three) times daily. 90 capsule 1   gatifloxacin (ZYMAXID) 0.5 % SOLN Place 1 drop into the left eye 4 (four) times daily.     insulin glargine (LANTUS) 100 UNIT/ML injection Inject 30 Units into the skin daily as  needed. Only takes Lantus when sugar is above 150.     NOVOLOG FLEXPEN 100 UNIT/ML FlexPen Inject 0-10 Units into the skin daily as needed for high blood sugar (above 200). Sliding scale     omeprazole (PRILOSEC) 20 MG capsule Take 20 mg by mouth daily.     oxyCODONE-acetaminophen (PERCOCET) 7.5-325 MG tablet Take 1 tablet by mouth every 4 (four) hours as needed for severe pain. Do not take with cough medication or other pain medication 60 tablet 0   potassium chloride SA (KLOR-CON M) 20 MEQ tablet Take 1 tablet (20 mEq total) by mouth daily. 30 tablet 0   Probiotic Product (PROBIOTIC DAILY PO) Take by mouth. Physician's Choice Probiotic     tamsulosin (FLOMAX) 0.4 MG CAPS capsule TAKE 1 CAPSULE(0.4 MG) BY MOUTH DAILY 30 capsule 0   traZODone (DESYREL) 50 MG tablet Take 1 tablet (50 mg total) by mouth at bedtime as needed for sleep. 30 tablet 3   UNABLE TO FIND PLEASE CHECK FASTING BLOOD GLUCOSE EVERY MORNING 1 each 0   megestrol (MEGACE) 40 MG tablet Take 1 tablet (40 mg total) by mouth daily. (Patient not taking: Reported on 07/29/2022) 30 tablet 0   No current facility-administered medications for this visit.   Facility-Administered Medications Ordered in Other Visits  Medication Dose Route Frequency Provider Last Rate Last Admin   heparin lock flush 100 UNIT/ML injection              PHYSICAL EXAMINATION:  Vitals:   07/29/22 1449  BP: 109/70  Pulse: 81  Resp: 18  Temp: 98.2 F (36.8 C)  SpO2: 99%   Filed Weights   07/29/22 1449  Weight: 137 lb 1.6 oz (62.2 kg)    Physical Exam Constitutional:      General: He is not in acute distress.    Appearance: He is ill-appearing.  HENT:     Head: Normocephalic and atraumatic.  Eyes:     General: No scleral icterus. Cardiovascular:     Rate and Rhythm: Normal rate and regular rhythm.     Heart sounds: Normal heart sounds.  Pulmonary:     Effort: Pulmonary effort is normal. No respiratory distress.     Breath sounds: No  wheezing.     Comments: Decreased breath sound bilaterally. Abdominal:     General: Bowel sounds are normal. There is no distension.     Palpations: Abdomen is soft.  Musculoskeletal:        General: No deformity. Normal range of motion.     Cervical back: Normal range of motion and neck supple.     Comments: Bilateral ankle trace edema.  Skin:    General: Skin is warm and dry.     Findings: No rash.  Neurological:     Mental Status: He is alert. Mental status is at baseline.     Cranial Nerves: No cranial nerve deficit.     Coordination: Coordination normal.  Psychiatric:        Mood and Affect: Mood normal.     LABORATORY DATA:  I have reviewed the data as listed    Latest Ref Rng & Units 07/29/2022    2:24 PM 07/07/2022    1:00 PM 06/16/2022   12:55 PM  CBC  WBC 4.0 - 10.5 K/uL 6.3  7.9  5.8   Hemoglobin 13.0 - 17.0 g/dL 29.5  62.1  30.8   Hematocrit 39.0 - 52.0 % 36.1  35.1  31.6   Platelets 150 - 400 K/uL 243  274  241       Latest Ref Rng & Units 07/29/2022    2:24 PM 07/07/2022    1:00 PM 06/16/2022   12:55 PM  CMP  Glucose 70 - 99 mg/dL 657  846  962   BUN 8 - 23 mg/dL 21  19  19    Creatinine 0.61 - 1.24 mg/dL 9.52  8.41  3.24   Sodium 135 - 145 mmol/L 134  136  133   Potassium 3.5 - 5.1 mmol/L 3.1  3.3  3.6   Chloride 98 - 111 mmol/L 97  100  100   CO2 22 - 32 mmol/L 29  26  25    Calcium 8.9 - 10.3 mg/dL 9.2  9.2  8.9   Total Protein 6.5 - 8.1 g/dL 7.0  7.1  6.7   Total Bilirubin 0.3 - 1.2 mg/dL 0.4  0.3  0.3   Alkaline Phos 38 - 126 U/L 63  75  64   AST 15 - 41 U/L 19  20  20    ALT 0 - 44 U/L 10  9  10       Iron/TIBC/Ferritin/ %Sat    Component Value Date/Time   IRON 60 02/03/2022 0958   TIBC 391 02/03/2022 0958   FERRITIN 704 (H) 02/03/2022 0958   IRONPCTSAT 15 (L) 02/03/2022 4010      RADIOGRAPHIC STUDIES: I have personally reviewed the radiological images as listed and agreed with the findings in the report. NM PET Image Restag (PS) Skull Base  To Thigh  Result Date: 07/07/2022 CLINICAL DATA:  Subsequent treatment strategy for non-small cell carcinoma of the left upper lobe. No known brain and bone metastases. EXAM: NUCLEAR MEDICINE PET SKULL BASE TO THIGH TECHNIQUE: 7.8 mCi F-18 FDG was injected intravenously. Full-ring PET imaging was performed from the skull base to thigh after the radiotracer. CT data was obtained and used for attenuation correction and anatomic localization. Fasting blood glucose: 120 mg/dl COMPARISON:  Chest abdomen pelvis CT 04/28/2022.  PET-CT 10/29/2021. FINDINGS: Mediastinal blood pool activity: SUV max  1.8 Liver activity: SUV max  NA NECK: No hypermetabolic lymph nodes in the neck. Photopenia identified in the left cerebellar hemisphere, corresponding to the site of patient's known prior hemorrhage. Incidental CT findings: None. CHEST: There is a small focus of intense hypermetabolism within the patient's left upper lobe scar ( SUV max = 7.4. A 5 mm perifissural nodule visible in the left lung on image 51/4 also shows hypermetabolism with SUV max = 4.2. Both of these findings are new since prior PET-CT. No hypermetabolic left hilar or mediastinal lymphadenopathy. No other hypermetabolic lymphadenopathy in the chest. Incidental CT findings: Tiny pericardial effusion. Coronary artery calcification is evident. Mild atherosclerotic calcification is noted in the wall of the thoracic aorta. Right Port-A-Cath tip is positioned in the upper right atrium dependent atelectasis noted in the lung bases. ABDOMEN/PELVIS: No abnormal hypermetabolic activity within the liver, pancreas, adrenal glands, or spleen. No hypermetabolic lymph nodes in the abdomen or pelvis. Incidental CT findings: Small cyst noted interpolar left kidney, stable. There is advanced atherosclerotic calcification of the abdominal aorta without aneurysm. SKELETON: Scattered sclerotic and lytic bone lesions again noted without hypermetabolism on PET imaging. There is focal  hypermetabolism in the right L4 transverse process associated with a fracture (see image 108/4). The fracture line was visible on the previous CT of 04/28/2022. No associated soft tissue lesion to suggest that this is pathologic in nature. There is also some minimal uptake associated with a prominent left-sided L4 superior endplate spur. Incidental CT findings: Interval healing of the lateral left seventh rib fracture seen previously. Stable left-sided sclerotic lesions at T12 and L1. L2 compression deformity again noted. IMPRESSION: 1. Interval development of a small focus of intense hypermetabolism within the left upper lobe scar. This is associated with a 5 mm perifissural nodule in the left lung which is hypermetabolic. Hypermetabolism at both of these locations is new since prior PET-CT and highly suspicious for recurrent disease. 2. No evidence for hypermetabolic soft tissue metastatic disease in the neck, abdomen, or pelvis. 3. Similar appearance of sclerotic and lytic bone lesions without hypermetabolism on PET imaging. 4. Focal hypermetabolism associated with a fracture of the right L4 transverse process. The fracture was visible on the previous CT of 04/28/2022. No associated soft tissue lesion to suggest that this is pathologic in nature. Tracer accumulation on today's study is in keeping with healing fracture. There is also some minimal uptake in a prominent spur associated with the L4 superior endplate, most likely degenerative. 5.  Aortic Atherosclerosis (ICD10-I70.0). Electronically Signed   By: Kennith Center M.D.   On: 07/07/2022 13:58

## 2022-07-30 ENCOUNTER — Ambulatory Visit: Payer: PPO

## 2022-07-30 ENCOUNTER — Ambulatory Visit: Admission: RE | Admit: 2022-07-30 | Payer: PPO | Source: Ambulatory Visit

## 2022-07-30 ENCOUNTER — Telehealth: Payer: Self-pay

## 2022-07-30 LAB — T4: T4, Total: 8.5 ug/dL (ref 4.5–12.0)

## 2022-07-30 MED ORDER — ALPRAZOLAM 0.5 MG PO TABS
0.5000 mg | ORAL_TABLET | Freq: Every day | ORAL | 0 refills | Status: DC | PRN
Start: 2022-07-30 — End: 2022-10-02

## 2022-07-30 NOTE — Telephone Encounter (Signed)
Pt's wife states that pt is cluastrophobic and will need something so he can get in radiation machine and have treatments. Dr. Cathie Hoops has sent in Xanax. Pt informed of this.

## 2022-07-30 NOTE — Addendum Note (Signed)
Addended by: [name] on: 07/30/2022 12:22 PM   Modules accepted: Orders

## 2022-08-01 ENCOUNTER — Ambulatory Visit
Admission: RE | Admit: 2022-08-01 | Discharge: 2022-08-01 | Disposition: A | Discharge status: Home / Self Care | Payer: PPO | Source: Ambulatory Visit | Attending: Radiation Oncology | Admitting: Radiation Oncology

## 2022-08-01 ENCOUNTER — Other Ambulatory Visit: Payer: Self-pay

## 2022-08-01 DIAGNOSIS — Z5112 Encounter for antineoplastic immunotherapy: Secondary | ICD-10-CM | POA: Diagnosis not present

## 2022-08-01 LAB — RAD ONC ARIA SESSION SUMMARY
Course Elapsed Days: 0
Plan Fractions Treated to Date: 1
Plan Prescribed Dose Per Fraction: 12 Gy
Plan Total Fractions Prescribed: 5
Plan Total Prescribed Dose: 60 Gy
Reference Point Dosage Given to Date: 12 Gy
Reference Point Session Dosage Given: 12 Gy
Session Number: 1

## 2022-08-04 ENCOUNTER — Ambulatory Visit: Payer: PPO

## 2022-08-05 ENCOUNTER — Other Ambulatory Visit: Payer: Self-pay

## 2022-08-05 ENCOUNTER — Ambulatory Visit
Admission: RE | Admit: 2022-08-05 | Discharge: 2022-08-05 | Disposition: A | Discharge status: Home / Self Care | Payer: PPO | Source: Ambulatory Visit | Attending: Radiation Oncology | Admitting: Radiation Oncology

## 2022-08-05 DIAGNOSIS — C3412 Malignant neoplasm of upper lobe, left bronchus or lung: Secondary | ICD-10-CM | POA: Insufficient documentation

## 2022-08-05 DIAGNOSIS — C7951 Secondary malignant neoplasm of bone: Secondary | ICD-10-CM | POA: Diagnosis not present

## 2022-08-05 DIAGNOSIS — Z510 Encounter for antineoplastic radiation therapy: Secondary | ICD-10-CM | POA: Insufficient documentation

## 2022-08-05 DIAGNOSIS — Z923 Personal history of irradiation: Secondary | ICD-10-CM | POA: Diagnosis not present

## 2022-08-05 LAB — RAD ONC ARIA SESSION SUMMARY
Course Elapsed Days: 4
Plan Fractions Treated to Date: 2
Plan Prescribed Dose Per Fraction: 12 Gy
Plan Total Fractions Prescribed: 5
Plan Total Prescribed Dose: 60 Gy
Reference Point Dosage Given to Date: 24 Gy
Reference Point Session Dosage Given: 12 Gy
Session Number: 2

## 2022-08-06 ENCOUNTER — Ambulatory Visit: Payer: PPO

## 2022-08-07 ENCOUNTER — Ambulatory Visit
Admission: RE | Admit: 2022-08-07 | Discharge: 2022-08-07 | Disposition: A | Discharge status: Home / Self Care | Payer: PPO | Source: Ambulatory Visit | Attending: Radiation Oncology | Admitting: Radiation Oncology

## 2022-08-07 ENCOUNTER — Other Ambulatory Visit: Payer: Self-pay | Admitting: Internal Medicine

## 2022-08-07 ENCOUNTER — Other Ambulatory Visit: Payer: Self-pay | Admitting: Family Medicine

## 2022-08-07 ENCOUNTER — Other Ambulatory Visit: Payer: Self-pay

## 2022-08-07 ENCOUNTER — Other Ambulatory Visit: Payer: Self-pay | Admitting: *Deleted

## 2022-08-07 DIAGNOSIS — Z510 Encounter for antineoplastic radiation therapy: Secondary | ICD-10-CM | POA: Diagnosis not present

## 2022-08-07 LAB — RAD ONC ARIA SESSION SUMMARY
Course Elapsed Days: 6
Plan Fractions Treated to Date: 3
Plan Prescribed Dose Per Fraction: 12 Gy
Plan Total Fractions Prescribed: 5
Plan Total Prescribed Dose: 60 Gy
Reference Point Dosage Given to Date: 36 Gy
Reference Point Session Dosage Given: 12 Gy
Session Number: 3

## 2022-08-07 MED ORDER — TRAZODONE HCL 50 MG PO TABS: 50.0000 mg | ORAL_TABLET | Freq: Every evening | ORAL | 3 refills | Status: DC | PRN

## 2022-08-07 NOTE — Telephone Encounter (Signed)
Requested Prescriptions  Pending Prescriptions Disp Refills   furosemide (LASIX) 20 MG tablet [Pharmacy Med Name: FUROSEMIDE 20MG  TABLETS] 90 tablet 0    Sig: TAKE 1 TABLET(20 MG) BY MOUTH DAILY AS NEEDED FOR LEG SWELLING OR SHORTNESS OF BREATH     Cardiovascular:  Diuretics - Loop Failed - 08/07/2022  6:20 AM      Failed - K in normal range and within 180 days    Potassium  Date Value Ref Range Status  07/29/2022 3.1 (L) 3.5 - 5.1 mmol/L Final         Failed - Na in normal range and within 180 days    Sodium  Date Value Ref Range Status  07/29/2022 134 (L) 135 - 145 mmol/L Final         Failed - Cr in normal range and within 180 days    Creat  Date Value Ref Range Status  02/11/2022 1.07 0.70 - 1.28 mg/dL Final   Creatinine, Ser  Date Value Ref Range Status  07/29/2022 1.59 (H) 0.61 - 1.24 mg/dL Final   Creatinine, Urine  Date Value Ref Range Status  03/06/2016 162 20 - 370 mg/dL Final         Failed - Cl in normal range and within 180 days    Chloride  Date Value Ref Range Status  07/29/2022 97 (L) 98 - 111 mmol/L Final         Failed - Mg Level in normal range and within 180 days    Magnesium  Date Value Ref Range Status  11/20/2021 1.8 1.7 - 2.4 mg/dL Final    Comment:    Performed at [facility], [address] [place], [place] [address]         Failed - Valid encounter within last 6 months    Recent Outpatient Visits           1 year ago Uncontrolled type 2 diabetes mellitus with hypoglycemia, unspecified hypoglycemia coma status (HCC)   [facility] Donita Brooks, MD   1 year ago Uncontrolled type 2 diabetes mellitus with hypoglycemia, unspecified hypoglycemia coma status (HCC)   [facility] Pickard, Priscille Heidelberg, MD   3 years ago Pruritus   [facility] Donita Brooks, MD   5 years ago Encounter for hepatitis C screening test for low risk patient   [facility] Donita Brooks, MD   5 years ago Type 2 diabetes mellitus without complication, with long-term current use of insulin (HCC)   [facility] [place], Velna Hatchet, MD              Passed - Ca in normal range and within 180 days    Calcium  Date Value Ref Range Status  07/29/2022 9.2 8.9 - 10.3 mg/dL Final         Passed - Last BP in normal range    BP Readings from Last 1 Encounters:  07/29/22 109/70

## 2022-08-08 DIAGNOSIS — M47816 Spondylosis without myelopathy or radiculopathy, lumbar region: Secondary | ICD-10-CM | POA: Diagnosis not present

## 2022-08-08 DIAGNOSIS — M1909 Primary osteoarthritis, other specified site: Secondary | ICD-10-CM | POA: Diagnosis not present

## 2022-08-08 DIAGNOSIS — M47896 Other spondylosis, lumbar region: Secondary | ICD-10-CM | POA: Diagnosis not present

## 2022-08-08 NOTE — Telephone Encounter (Signed)
duplicate

## 2022-08-11 ENCOUNTER — Ambulatory Visit: Payer: PPO

## 2022-08-12 ENCOUNTER — Ambulatory Visit
Admission: RE | Admit: 2022-08-12 | Discharge: 2022-08-12 | Disposition: A | Discharge status: Home / Self Care | Payer: PPO | Source: Ambulatory Visit | Attending: Radiation Oncology | Admitting: Radiation Oncology

## 2022-08-12 ENCOUNTER — Other Ambulatory Visit: Payer: Self-pay | Admitting: *Deleted

## 2022-08-12 ENCOUNTER — Other Ambulatory Visit: Payer: Self-pay

## 2022-08-12 DIAGNOSIS — C7951 Secondary malignant neoplasm of bone: Secondary | ICD-10-CM

## 2022-08-12 DIAGNOSIS — Z510 Encounter for antineoplastic radiation therapy: Secondary | ICD-10-CM | POA: Diagnosis not present

## 2022-08-12 LAB — RAD ONC ARIA SESSION SUMMARY
Course Elapsed Days: 11
Plan Fractions Treated to Date: 4
Plan Prescribed Dose Per Fraction: 12 Gy
Plan Total Fractions Prescribed: 5
Plan Total Prescribed Dose: 60 Gy
Reference Point Dosage Given to Date: 48 Gy
Reference Point Session Dosage Given: 12 Gy
Session Number: 4

## 2022-08-14 ENCOUNTER — Other Ambulatory Visit: Payer: Self-pay

## 2022-08-14 ENCOUNTER — Ambulatory Visit
Admission: RE | Admit: 2022-08-14 | Discharge: 2022-08-14 | Disposition: A | Discharge status: Home / Self Care | Payer: PPO | Source: Ambulatory Visit | Attending: Radiation Oncology | Admitting: Radiation Oncology

## 2022-08-14 DIAGNOSIS — Z510 Encounter for antineoplastic radiation therapy: Secondary | ICD-10-CM | POA: Diagnosis not present

## 2022-08-14 DIAGNOSIS — C3412 Malignant neoplasm of upper lobe, left bronchus or lung: Secondary | ICD-10-CM | POA: Diagnosis not present

## 2022-08-14 LAB — RAD ONC ARIA SESSION SUMMARY
Course Elapsed Days: 13
Plan Fractions Treated to Date: 5
Plan Prescribed Dose Per Fraction: 12 Gy
Plan Total Fractions Prescribed: 5
Plan Total Prescribed Dose: 60 Gy
Reference Point Dosage Given to Date: 60 Gy
Reference Point Session Dosage Given: 12 Gy
Session Number: 5

## 2022-08-15 ENCOUNTER — Telehealth: Payer: Self-pay | Admitting: *Deleted

## 2022-08-15 ENCOUNTER — Inpatient Hospital Stay: Payer: PPO | Attending: Radiation Oncology | Admitting: Hospice and Palliative Medicine

## 2022-08-15 DIAGNOSIS — Z5112 Encounter for antineoplastic immunotherapy: Secondary | ICD-10-CM | POA: Insufficient documentation

## 2022-08-15 DIAGNOSIS — G893 Neoplasm related pain (acute) (chronic): Secondary | ICD-10-CM | POA: Insufficient documentation

## 2022-08-15 DIAGNOSIS — I2699 Other pulmonary embolism without acute cor pulmonale: Secondary | ICD-10-CM | POA: Insufficient documentation

## 2022-08-15 DIAGNOSIS — Z515 Encounter for palliative care: Secondary | ICD-10-CM

## 2022-08-15 DIAGNOSIS — C7931 Secondary malignant neoplasm of brain: Secondary | ICD-10-CM | POA: Insufficient documentation

## 2022-08-15 DIAGNOSIS — C7951 Secondary malignant neoplasm of bone: Secondary | ICD-10-CM | POA: Insufficient documentation

## 2022-08-15 DIAGNOSIS — C3412 Malignant neoplasm of upper lobe, left bronchus or lung: Secondary | ICD-10-CM | POA: Diagnosis not present

## 2022-08-15 DIAGNOSIS — R531 Weakness: Secondary | ICD-10-CM

## 2022-08-15 DIAGNOSIS — E876 Hypokalemia: Secondary | ICD-10-CM | POA: Insufficient documentation

## 2022-08-15 NOTE — Progress Notes (Signed)
Virtual Visit via Telephone Note  I connected with [name] on 08/15/22 at  1:00 PM EDT by telephone and verified that I am speaking with the correct person using two identifiers.  Location: Patient: Home Provider: Clinic   I discussed the limitations, risks, security and privacy concerns of performing an evaluation and management service by telephone and the availability of in person appointments. I also discussed with the patient that there may be a patient responsible charge related to this service. The patient expressed understanding and agreed to proceed.   History of Present Illness: Micheal Donovan is a 72 y.o. male with multiple medical problems including non-small cell lung cancer, history of subacute intraparenchymal hemorrhage, PE.  Patient is status post XRT.  He is on systemic chemo.  He is referred to palliative care to address goals and manage ongoing symptoms.    Observations/Objective: I spoke with patient and wife by phone.   Patient is status post ablative procedure by Dr. Ethelene Hal without significant improvement in symptoms.  Patient continues to take fentanyl and Percocet.  He remains weak and says he is having to pick up his leg with his arms.  He would like to initiate home health PT.  Assessment and Plan: Neoplasm related pain -continue fentanyl/Percocet.    Weakness -referral to home health PT  Follow Up Instructions: Follow-up telephone visit 1 month   I discussed the assessment and treatment plan with the patient. The patient was provided an opportunity to ask questions and all were answered. The patient agreed with the plan and demonstrated an understanding of the instructions.   The patient was advised to call back or seek an in-person evaluation if the symptoms worsen or if the condition fails to improve as anticipated.  I provided 10 minutes of non-face-to-face time during this encounter.   [name], NP

## 2022-08-15 NOTE — Telephone Encounter (Signed)
Dr. Cathie Hoops. I have pended an order for [facility] PT. [name] is unable to order as face to face is needed. Would you make an addendum to your 07/29/22 note for [facility] PT and sign the attach pended order on behalf of Josh. Once order is signed, I will contact the [facility] agency. Thanks.

## 2022-08-18 ENCOUNTER — Inpatient Hospital Stay (HOSPITAL_BASED_OUTPATIENT_CLINIC_OR_DEPARTMENT_OTHER): Payer: PPO | Admitting: Oncology

## 2022-08-18 ENCOUNTER — Encounter: Payer: Self-pay | Admitting: Oncology

## 2022-08-18 ENCOUNTER — Inpatient Hospital Stay: Payer: PPO

## 2022-08-18 VITALS — BP 140/59 | HR 63 | Temp 98.8°F | Resp 17

## 2022-08-18 VITALS — BP 112/76 | HR 77 | Temp 99.0°F | Resp 16 | Ht 67.0 in | Wt 130.0 lb

## 2022-08-18 DIAGNOSIS — G893 Neoplasm related pain (acute) (chronic): Secondary | ICD-10-CM | POA: Diagnosis not present

## 2022-08-18 DIAGNOSIS — Z5112 Encounter for antineoplastic immunotherapy: Secondary | ICD-10-CM | POA: Diagnosis not present

## 2022-08-18 DIAGNOSIS — C3412 Malignant neoplasm of upper lobe, left bronchus or lung: Secondary | ICD-10-CM | POA: Diagnosis not present

## 2022-08-18 DIAGNOSIS — C7931 Secondary malignant neoplasm of brain: Secondary | ICD-10-CM | POA: Diagnosis not present

## 2022-08-18 DIAGNOSIS — E876 Hypokalemia: Secondary | ICD-10-CM

## 2022-08-18 DIAGNOSIS — I2699 Other pulmonary embolism without acute cor pulmonale: Secondary | ICD-10-CM | POA: Diagnosis not present

## 2022-08-18 DIAGNOSIS — R634 Abnormal weight loss: Secondary | ICD-10-CM

## 2022-08-18 DIAGNOSIS — C7951 Secondary malignant neoplasm of bone: Secondary | ICD-10-CM | POA: Diagnosis not present

## 2022-08-18 LAB — COMPREHENSIVE METABOLIC PANEL
ALT: 13 U/L (ref 0–44)
AST: 21 U/L (ref 15–41)
Albumin: 3.7 g/dL (ref 3.5–5.0)
Alkaline Phosphatase: 58 U/L (ref 38–126)
Anion gap: 7 (ref 5–15)
BUN: 14 mg/dL (ref 8–23)
CO2: 23 mmol/L (ref 22–32)
Calcium: 9 mg/dL (ref 8.9–10.3)
Chloride: 104 mmol/L (ref 98–111)
Creatinine, Ser: 1.26 mg/dL — ABNORMAL HIGH (ref 0.61–1.24)
GFR, Estimated: >60 mL/min (ref >60)
Glucose, Bld: 143 mg/dL — ABNORMAL HIGH (ref 70–99)
Potassium: 3.5 mmol/L (ref 3.5–5.1)
Sodium: 134 mmol/L — ABNORMAL LOW (ref 135–145)
Total Bilirubin: 0.4 mg/dL (ref 0.3–1.2)
Total Protein: 6.7 g/dL (ref 6.5–8.1)

## 2022-08-18 LAB — CBC WITH DIFFERENTIAL/PLATELET
Abs Immature Granulocytes: 0.02 10*3/uL (ref 0.00–0.07)
Basophils Absolute: 0 10*3/uL (ref 0.0–0.1)
Basophils Relative: 1 %
Eosinophils Absolute: 0.2 10*3/uL (ref 0.0–0.5)
Eosinophils Relative: 5 %
HCT: 34.9 % — ABNORMAL LOW (ref 39.0–52.0)
Hemoglobin: 11.8 g/dL — ABNORMAL LOW (ref 13.0–17.0)
Immature Granulocytes: 1 %
Lymphocytes Relative: 12 %
Lymphs Abs: 0.4 10*3/uL — ABNORMAL LOW (ref 0.7–4.0)
MCH: 30.1 pg (ref 26.0–34.0)
MCHC: 33.8 g/dL (ref 30.0–36.0)
MCV: 89 fL (ref 80.0–100.0)
Monocytes Absolute: 0.4 10*3/uL (ref 0.1–1.0)
Monocytes Relative: 11 %
Neutro Abs: 2.3 10*3/uL (ref 1.7–7.7)
Neutrophils Relative %: 70 %
Platelets: 218 10*3/uL (ref 150–400)
RBC: 3.92 MIL/uL — ABNORMAL LOW (ref 4.22–5.81)
RDW: 13.4 % (ref 11.5–15.5)
WBC: 3.2 10*3/uL — ABNORMAL LOW (ref 4.0–10.5)
nRBC: 0 % (ref 0.0–0.2)

## 2022-08-18 MED ORDER — HEPARIN SOD (PORK) LOCK FLUSH 100 UNIT/ML IV SOLN
500.0000 [IU] | Freq: Once | INTRAVENOUS | Status: AC | PRN
  Administered 2022-08-18: 500 [IU]
  Filled 2022-08-18: qty 5

## 2022-08-18 MED ORDER — SODIUM CHLORIDE 0.9 % IV SOLN
200.0000 mg | Freq: Once | INTRAVENOUS | Status: AC
  Administered 2022-08-18: 200 mg via INTRAVENOUS
  Filled 2022-08-18: qty 200

## 2022-08-18 MED ORDER — NYSTATIN 100000 UNIT/ML MT SUSP: 5.0000 mL | Freq: Four times a day (QID) | OROMUCOSAL | 2 refills | Status: DC

## 2022-08-18 MED ORDER — SODIUM CHLORIDE 0.9 % IV SOLN
Freq: Once | INTRAVENOUS | Status: AC
  Filled 2022-08-18: qty 250

## 2022-08-18 NOTE — Assessment & Plan Note (Addendum)
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  PET scan images were reviewed with patient.- good partial response.  Labs are reviewed and discussed with patient. Proceed with Micheal Donovan

## 2022-08-18 NOTE — Patient Instructions (Signed)
[facility]  Discharge Instructions: Thank you for choosing [facility] to provide your oncology and hematology care.  If you have a lab appointment with the Cancer Center, please go directly to the Cancer Center and check in at the registration area.  Wear comfortable clothing and clothing appropriate for easy access to any Portacath or PICC line.   We strive to give you quality time with your provider. You may need to reschedule your appointment if you arrive late (15 or more minutes).  Arriving late affects you and other patients whose appointments are after yours.  Also, if you miss three or more appointments without notifying the office, you may be dismissed from the clinic at the provider's discretion.      For prescription refill requests, have your pharmacy contact our office and allow 72 hours for refills to be completed.    Today you received the following chemotherapy and/or immunotherapy agents- Keytruda      To help prevent nausea and vomiting after your treatment, we encourage you to take your nausea medication as directed.  BELOW ARE SYMPTOMS THAT SHOULD BE REPORTED IMMEDIATELY: *FEVER GREATER THAN 100.4 F (38 C) OR HIGHER *CHILLS OR SWEATING *NAUSEA AND VOMITING THAT IS NOT CONTROLLED WITH YOUR NAUSEA MEDICATION *UNUSUAL SHORTNESS OF BREATH *UNUSUAL BRUISING OR BLEEDING *URINARY PROBLEMS (pain or burning when urinating, or frequent urination) *BOWEL PROBLEMS (unusual diarrhea, constipation, pain near the anus) TENDERNESS IN MOUTH AND THROAT WITH OR WITHOUT PRESENCE OF ULCERS (sore throat, sores in mouth, or a toothache) UNUSUAL RASH, SWELLING OR PAIN  UNUSUAL VAGINAL DISCHARGE OR ITCHING   Items with * indicate a potential emergency and should be followed up as soon as possible or go to the Emergency Department if any problems should occur.  Please show the CHEMOTHERAPY ALERT CARD or IMMUNOTHERAPY ALERT CARD at check-in to  the Emergency Department and triage nurse.  Should you have questions after your visit or need to cancel or reschedule your appointment, please contact [facility]  [phone] and follow the prompts.  Office hours are 8:00 a.m. to 4:30 p.m. Monday - Friday. Please note that voicemails left after 4:00 p.m. may not be returned until the following business day.  We are closed weekends and major holidays. You have access to a nurse at all times for urgent questions. Please call the main number to the clinic [phone] and follow the prompts.  For any non-urgent questions, you may also contact your provider using MyChart. We now offer e-Visits for anyone 18 and older to request care online for non-urgent symptoms. For details visit [email or web address].   Also download the MyChart app! Go to the app store, search "MyChart", open the app, select [facility], and log in with your MyChart username and password.   

## 2022-08-18 NOTE — Assessment & Plan Note (Signed)
Back pain, MRI lumbar showed compression fracture Continue PRN percocet 7.5mg/325mg, fentanyl patch 75mcg Q72 hours MRI lumbar showed DJD, L4-L5 synovial cyst,PET scan showed L4 fracture   

## 2022-08-18 NOTE — Assessment & Plan Note (Signed)
Recommend nutritional supplementation.  Trials of Megace 40mg  daily.

## 2022-08-18 NOTE — Assessment & Plan Note (Signed)
Due to lasix use.  Recommend potassium 20meq daily  

## 2022-08-18 NOTE — Progress Notes (Signed)
Hematology/Oncology Progress note [phone] [phone]       CHIEF COMPLAINTS/REASON FOR VISIT:  Metastatic non-small cell lung cancer  ASSESSMENT & PLAN:   Cancer Staging  Primary non-small cell carcinoma of upper lobe of left lung (HCC) Staging form: Lung, AJCC 8th Edition - Clinical: Stage IV (cT2b, cN1, cM1) - Signed by [name], MD on 08/06/2021   Primary non-small cell carcinoma of upper lobe of left lung (HCC) Stage IV lung adenocarcinoma with brain and bone metastasis.  PET scan images were reviewed with patient.- good partial response.  Labs are reviewed and discussed with patient. Proceed with Keytruda    Pulmonary embolus (HCC) Previous MRI brain shows persistent microbleeds.   Off Eliquis 2.5mg  BID.   Metastasis to bone (HCC) Recommend Zometa every 4- 6 weeks.- July 2024 Continue calcium supplementation.  MRI lumbar with and without contrast showed  Stable remote superior endplate fracture of L2 and large Schmorl to noted involving L5. 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 PET scan shows stable bone lesions.  L4 fracture uptake likely benign- s/p nerve block on 6/7   Neoplasm related pain Back pain, MRI lumbar showed compression fracture Continue PRN percocet 7.5mg /325mg , fentanyl patch Q72 hours MRI lumbar showed DJD, L4-L5 synovial cyst,PET scan showed L4 fracture    Encounter for antineoplastic immunotherapy Immunotherapy plan as listed above  Weight loss Recommend nutritional supplementation.  Trials of Megace 40mg  daily.   Hypokalemia Due to lasix use.  Recommend potassium daily     Follow up 3 weeks lab MD Keytruda. All questions were answered. The patient knows to call the clinic with any problems, questions or concerns.  [name], MD, [name] [facility] Hematology Oncology 08/18/2022      HISTORY OF PRESENTING ILLNESS:   Micheal Donovan is a  72 y.o.  male presents for follow up of Non-small cell lung cancer.  Oncology History Overview Note  Diagnosis: Stage IIB T2b N1 M0 adenocarcinoma of the LUL, poorly differentiated     Primary non-small cell carcinoma of upper lobe of left lung (HCC)  06/13/2021 Initial Diagnosis   Primary non-small cell carcinoma of upper lobe of left lung [facility]) -06/20/2021 - 06/27/2021, patient presented to [facility] due to progressive headache/dizziness/gait changes. CT head showed acute to subacute intraparenchymal hemorrhage involving the left cerebellum.  Surrounding low-density vasogenic edema.  Patient was transferred to [facility]. This was further evaluated by CT angiogram of the neck which showed bulky calcified plaques/stenosis of carotid artery, Followed by MRI brain. 06/12/2021, MRI of the brain showed no significant interval change in size of the left cerebellar intraparenchymal hematoma with unchanged regional mass effect and partial effacement of fourth ventricle but no upstream hydrocephalus. There is no discernible enhancement to suggest underlying mass lesion, though acute blood products could mask enhancement.   06/12/2021 a chest x-ray showed a 4.4 cm left middle lobe. 06/13/2021, CT chest with contrast showed a 4.3 x 3.6 x 3.3 cm lobular spiculated mass in the posterior left upper lobe with T3 to the lateral pleura and major fissure.  Metastatic left hilar lymphadenopathy.  Peripheral micronodularity posterior right costophrenic sulcus.  Aortic atherosclerosis. 06/14/2021, CT abdomen pelvis showed right lower lobe pulmonary artery embolus.  No evidence of right heart strain.  No acute intra-abdominal or pelvic pathology.  Aortic atherosclerosis.  06/13/2021, patient underwent bronchoscopy with EBUS by Dr. Katrinka Blazing.  Biopsy from the fine-needle aspiration of  station 11 mL showed malignant cells, consistent with poorly differentiated non-small cell carcinoma, consistent with  adenocarcinoma.  Malignant cells are TTF-1 positive and negative for p40.  Negative for neuroendocrine markers.  Blood test and tissue sample were tested for Gardant 360  -PD-L1 TPS 97%, no actionable mutation on the blood testing. 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   07/17/2021 Cancer Staging   Staging form: Lung, AJCC 8th Edition - Clinical: Stage IV (cT2b, cN1, cM1) - Signed by [name], MD on 08/06/2021   08/06/2021 Imaging   I saw patient's PET scan after his visit with me on 08/06/21. PET scan was ordered by his previous oncologist group and did not come to my in basket.. Patient received cycle 1 carboplatin Taxol today.  He has started on radiation.  5/26/3 PET scan showed 3.8 cm hypermetabolic left upper lobe mass with hypermetabolic left hilar and infrahilar adenopathy.  There is approximately 8 scattered metastatic lesions in the skeleton. Mixed density photopenic lesion in the left cerebellum. characterized as hemorrhage on recent prior imaging workups.    08/16/2021 -  Chemotherapy   Patient is on Treatment Plan : LUNG Carboplatin (5) + Pemetrexed + Pembrolizumab (200) D1 q21d Induction x 4 cycles / Maintenance Pemetrexed + Pembrolizumab (200) D1 q21d      08/22/2021 Imaging   MRI brain w wo contrast  Decrease in size of left cerebellar hematoma with resolution of edema. No evidence of underlying lesion. Smaller, more recent hemorrhage in the left cerebellar vermis with minimal edema. There is minimal enhancement without definite evidence of underlying lesion.  New punctate focus of chronic blood products and enhancement in the left frontal lobe. Additional new foci of chronic blood products in the posterior right putamen, right parietal subcortical white matter, and posterior right cerebellum. Unclear at this time if these represent foci of bland hemorrhage or early metastases.   Increase in size of right parietal osseous metastasis with minor extraosseous  extension.     Imaging   PET scan showed 1. Interval response to therapy as evidenced by a small residual left upper lobe nodule with decreased hypermetabolism, no residual hypermetabolic adenopathy and decreased hypermetabolism associatedwith osseous metastases. 2. 6 mm posterior left upper lobe nodule, likely stable. Recommend attention on follow-up. 3. Aortic atherosclerosis (ICD10-I70.0). Coronary artery calcification.   12/17/2021 Imaging   MRI lumbar spine w wo contrast  1. No evidence of regional metastatic disease. 2. Late subacute superior endplate fracture at L2 and large superior endplate Schmorl's node at L5, unchanged since the PET scan of 10/29/2021. 3. L3-4: Shallow disc protrusion. Facet and ligamentous hypertrophy. Stenosis of both lateral recesses. Findings slightly worsened since 2022. 4. L4-5: Shallow disc protrusion. Facet and ligamentous hypertrophy. Small synovial cyst arising from the facet joint on the right. Stenosis of the lateral recesses right worse than left. Foraminal narrowing right worse than left. Findings have worsened since 2022.  5. L5-S1: Endplate osteophytes and shallow protrusion of the disc. Facet and ligamentous hypertrophy. Stenosis of the subarticular lateral recesses and neural foramina, right worse than left. Similar appearance to the study of 2022. 6. Continued evidence of enteritis of at least 1 loop of small bowel. Distended bladder present   12/18/2021 Imaging   Brian MRI w wo  1. Interval decrease in size of the left cerebellar and vermian hematomas. No definite evidence of underlying metastatic lesion. 2. There is are two possible contrast enhancing lesions in the posterior left frontal lobe and left occipital lobe, which  do not have intrinsic T1 signal abnormality, but have a somewhat linear appearance and may be vascular in nature. Recommend attention on follow up. 3. Multifocal sites of susceptibility artifact are redemonstrated with  interval development of a few new foci, as described above. All of these sites demonstrate intrinsic T1 hyperintense signal abnormality and susceptibility artifact and are compatible with sites of microhemorrhages. Recommend continued attention on follow up. 4. Interval decrease in size of right parietal calvarial metastatic lesion. No new contrast enhancing lesions visualized. 5. Diffusely heterogeneous marrow signal throughout the cervical spine, which is nonspecific but can be seen in the setting of anemia, smoking, obesity, or a marrow replacement process.     12/18/2021 Imaging   MRI lumbar spine w wo contrast  1. No evidence of regional metastatic disease. 2. Late subacute superior endplate fracture at L2 and large superior endplate Schmorl's node at L5, unchanged since the PET scan of 10/29/2021 3. L3-4: Shallow disc protrusion. Facet and ligamentous hypertrophy.Stenosis of both lateral recesses. Findings slightly worsened since 2022. 4. L4-5: Shallow disc protrusion. Facet and ligamentous hypertrophy. Small synovial cyst arising from the facet joint on the right. Stenosis of the lateral recesses right worse than left. Foraminal narrowing right worse than left. Findings have worsened since 2022. 5. L5-S1: Endplate osteophytes and shallow protrusion of the disc. Facet and ligamentous hypertrophy. Stenosis of the subarticular lateral recesses and neural foramina, right worse than left. Similar appearance to the study of 2022. 6. Continued evidence of enteritis of at least 1 loop of small bowel. Distended bladder present.       01/28/2022 Imaging   CT chest abdomen pelvis w contrast 1. Treated left upper lobe mass appears grossly stable to the prior examination when measured in a similar fashion on the prior study. No definite signs of extra skeletal metastatic disease noted in the chest, abdomen or pelvis. 2. Widespread skeletal metastases redemonstrated, as above. 3. Hepatic  steatosis. 4. Aortic atherosclerosis, in addition to left main and three-vessel coronary artery disease. Assessment for potential risk factor modification, dietary therapy or pharmacologic therapy may be warranted, if clinically indicated. 5. Additional incidental findings,    04/08/2022 Imaging   MRI lumbar spine w wo contrast  1. Stable remote superior endplate fracture of L2 and large Schmorl to noted involving L5. No worrisome bone lesions to suggest metastatic disease. 2. Degenerative lumbar spondylosis with multilevel disc disease and facet disease which appears relatively stable as detailed above. 3. Enlarging right-sided synovial cyst at L4-5 with progressive mass effect on the right side of the thecal sac. This could potentially be symptomatic   04/28/2022 Imaging   CT chest abdomen pelvis wo contrast  1. Similar versus slightly decreased size of treated left upper lobe primary bronchogenic carcinoma. 2. Similar osseous metastasis. 3. No evidence of extraosseous metastatic disease. 4. New small right pleural effusion. 5. Coronary artery atherosclerosis. Aortic Atherosclerosis   07/03/2022 Imaging   PET scan showed 1. Interval development of a small focus of intense hypermetabolism within the left upper lobe scar. This is associated with a 5 mm perifissural nodule in the left lung which is hypermetabolic. Hypermetabolism at both of these locations is new since prior PET-CT and highly suspicious for recurrent disease. 2. No evidence for hypermetabolic soft tissue metastatic disease in the neck, abdomen, or pelvis. 3. Similar appearance of sclerotic and lytic bone lesions without hypermetabolism on PET imaging. 4. Focal hypermetabolism associated with a fracture of the right L4 transverse process. The fracture was visible  on the previous CT of 04/28/2022. No associated soft tissue lesion to suggest that this is pathologic in nature. Tracer accumulation on today's study is in keeping with  healing fracture. There is also some minimal uptake in a prominent spur associated with the L4 superior endplate, most likely degenerative. 5.  Aortic Atherosclerosis    # Patient has a history of melanoma on his neck, treated in 2009. # History of hemorrhagic infarct left cerebellum  INTERVAL HISTORY Micheal Donovan is a 72 y.o. male who has above history reviewed by me today presents for follow up visit for management of  Stage IV lung adenocarcianoma # Pulmonary embolism, SOB is stable, not worse. Off Eliquis due to MRI findings.  + back pain, and left lower extremity weakness due to pain, on fentanyl patch Q72 hours and percocet, he sees orthopedic surgeon for epidural injections.  Accompanied by wife.  No fever or chills.     Review of Systems  Constitutional:  Positive for appetite change, fatigue and unexpected weight change. Negative for chills and fever.  HENT:   Negative for hearing loss and voice change.   Eyes:  Negative for eye problems and icterus.  Respiratory:  Positive for shortness of breath. Negative for chest tightness and cough.   Cardiovascular:  Positive for leg swelling. Negative for chest pain.  Gastrointestinal:  Negative for abdominal distention, abdominal pain and blood in stool.  Endocrine: Negative for hot flashes.  Genitourinary:  Negative for difficulty urinating, dysuria and frequency.   Musculoskeletal:  Positive for back pain. Negative for arthralgias.  Skin:  Negative for itching and rash.  Neurological:  Negative for extremity weakness, headaches, light-headedness and numbness.  Hematological:  Negative for adenopathy. Does not bruise/bleed easily.  Psychiatric/Behavioral:  Positive for sleep disturbance. Negative for confusion.     MEDICAL HISTORY:  Past Medical History:  Diagnosis Date   Allergy    BPH (benign prostatic hypertrophy)    Cancer (HCC)    Melanoma on Neck    2008   Carotid artery occlusion    CHF (congestive heart  failure) (HCC)    Diabetes mellitus    type 2   ED (erectile dysfunction)    GERD (gastroesophageal reflux disease)    Hemorrhagic stroke (HCC) 06/2021   Hyperlipidemia    Hypertension    Lung cancer (HCC)    Neoplasm related pain    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)     SURGICAL HISTORY: Past Surgical History:  Procedure Laterality Date   BRONCHIAL NEEDLE ASPIRATION BIOPSY  06/17/2021   Procedure: BRONCHIAL NEEDLE ASPIRATION BIOPSIES;  Surgeon: Lorin Glass, MD;  Location: [facility] ENDOSCOPY;  Service: Pulmonary;;   IR IMAGING GUIDED PORT INSERTION  08/05/2021   MELANOMA EXCISION  2008   Left side of neck   RADIOLOGY WITH ANESTHESIA N/A 04/05/2020   Procedure: MRI SPINE WITOUT CONTRAST;  Surgeon: Radiologist, Medication, MD;  Location: [facility] OR;  Service: Radiology;  Laterality: N/A;   RADIOLOGY WITH ANESTHESIA N/A 08/22/2021   Procedure: MRI BRAIN WITH AND WITHOUT CONTRAST  WITH ANESTHESIA;  Surgeon: Radiologist, Medication, MD;  Location: [facility] OR;  Service: Radiology;  Laterality: N/A;   RADIOLOGY WITH ANESTHESIA N/A 12/17/2021   Procedure: MRI BRAIN WITH AND WITHOUT CONTRAST WITH ANESTHESIA; MRI LUMBER WITH AND WITHOUT CONTRAST;  Surgeon: Radiologist, Medication, MD;  Location: [facility] OR;  Service: Radiology;  Laterality: N/A;   RADIOLOGY WITH ANESTHESIA N/A 04/08/2022   Procedure: MRI LUMBER SPINE WITH AND WITHOUT CONTRAST;  Surgeon: Radiologist, Medication, MD;  Location: [facility] OR;  Service: Radiology;  Laterality: N/A;   TONSILLECTOMY     VIDEO BRONCHOSCOPY WITH ENDOBRONCHIAL ULTRASOUND N/A 06/17/2021   Procedure: VIDEO BRONCHOSCOPY WITH ENDOBRONCHIAL ULTRASOUND;  Surgeon: Lorin Glass, MD;  Location: [facility] ENDOSCOPY;  Service: Pulmonary;  Laterality: N/A;    SOCIAL HISTORY: Social History   Socioeconomic History   Marital status: Married    Spouse name: Not on file   Number of children: Not on file   Years of education: Not on file   Highest education level: Not on file  Occupational  History   Not on file  Tobacco Use   Smoking status: Former    Types: Cigarettes    Quit date: 07/21/1990    Years since quitting: 32.0   Smokeless tobacco: Never  Vaping Use   Vaping Use: Never used  Substance and Sexual Activity   Alcohol use: No   Drug use: No   Sexual activity: Not Currently  Other Topics Concern   Not on file  Social History Narrative   Not on file   Social Determinants of Health   Financial Resource Strain: Low Risk  (04/17/2022)   Overall Financial Resource Strain (CARDIA)    Difficulty of Paying Living Expenses: Not hard at all  Food Insecurity: No Food Insecurity (04/17/2022)   Hunger Vital Sign    Worried About Running Out of Food in the Last Year: Never true    Ran Out of Food in the Last Year: Never true  Transportation Needs: No Transportation Needs (03/27/2022)   PRAPARE - Administrator, Civil Service (Medical): No    Lack of Transportation (Non-Medical): No  Physical Activity: Inactive (03/27/2022)   Exercise Vital Sign    Days of Exercise per Week: 0 days    Minutes of Exercise per Session: 0 min  Stress: Stress Concern Present (03/27/2022)   [employer] of Occupational Health - Occupational Stress Questionnaire    Feeling of Stress : To some extent  Social Connections: Moderately Isolated (03/27/2022)   Social Connection and Isolation Panel [NHANES]    Frequency of Communication with Friends and Family: More than three times a week    Frequency of Social Gatherings with Friends and Family: Three times a week    Attends Religious Services: Never    Active Member of Clubs or Organizations: No    Attends Banker Meetings: Never    Marital Status: Married  Catering manager Violence: Not At Risk (03/27/2022)   Humiliation, Afraid, Rape, and Kick questionnaire    Fear of Current or Ex-Partner: No    Emotionally Abused: No    Physically Abused: No    Sexually Abused: No    FAMILY HISTORY: Family History   Problem Relation Age of Onset   COPD Mother    Heart disease Father    Heart disease Brother        MI at age 109   Stroke Neg Hx     ALLERGIES:  is allergic to niaspan [niacin].  MEDICATIONS:  Current Outpatient Medications  Medication Sig Dispense Refill   ALPRAZolam (XANAX) 0.5 MG tablet Take 1 tablet (0.5 mg total) by mouth daily as needed for anxiety (prior to radiation.). 30 tablet 0   aspirin EC 81 MG tablet Take 1 tablet (81 mg total) by mouth daily. Swallow whole. 30 tablet 12   atorvastatin (LIPITOR) 40 MG tablet TAKE 1 TABLET(40 MG) BY MOUTH DAILY 60 tablet 2  Ca Phosphate-Cholecalciferol (CALCIUM WITH D3 PO) Take 1 capsule by mouth daily.     fentaNYL (DURAGESIC) 75 MCG/HR Place 1 patch onto the skin every 3 (three) days. 10 patch 0   folic acid (FOLVITE) 800 MCG tablet Take 1 tablet (800 mcg total) by mouth daily. 60 tablet 1   furosemide (LASIX) 20 MG tablet TAKE 1 TABLET(20 MG) BY MOUTH DAILY AS NEEDED FOR LEG SWELLING OR SHORTNESS OF BREATH 90 tablet 0   gabapentin (NEURONTIN) 300 MG capsule Take 1 capsule (300 mg total) by mouth 3 (three) times daily. 90 capsule 1   gatifloxacin (ZYMAXID) 0.5 % SOLN Place 1 drop into the left eye 4 (four) times daily.     insulin glargine (LANTUS) 100 UNIT/ML injection Inject 30 Units into the skin daily as needed. Only takes Lantus when sugar is above 150.     megestrol (MEGACE) 40 MG tablet Take 1 tablet (40 mg total) by mouth daily. 30 tablet 0   NOVOLOG FLEXPEN 100 UNIT/ML FlexPen Inject 0-10 Units into the skin daily as needed for high blood sugar (above 200). Sliding scale     nystatin (MYCOSTATIN) 100000 UNIT/ML suspension Take 5 mLs (500,000 Units total) by mouth 4 (four) times daily. Swish and spit 473 mL 2   omeprazole (PRILOSEC) 20 MG capsule Take 20 mg by mouth daily.     oxyCODONE-acetaminophen (PERCOCET) 7.5-325 MG tablet Take 1 tablet by mouth every 4 (four) hours as needed for severe pain. Do not take with cough  medication or other pain medication 60 tablet 0   potassium chloride SA (KLOR-CON M) 20 MEQ tablet Take 1 tablet (20 mEq total) by mouth daily. 30 tablet 0   Probiotic Product (PROBIOTIC DAILY PO) Take by mouth. Physician's Choice Probiotic     tamsulosin (FLOMAX) 0.4 MG CAPS capsule TAKE 1 CAPSULE(0.4 MG) BY MOUTH DAILY 30 capsule 0   traZODone (DESYREL) 50 MG tablet Take 1 tablet (50 mg total) by mouth at bedtime as needed for sleep. 30 tablet 3   UNABLE TO FIND PLEASE CHECK FASTING BLOOD GLUCOSE EVERY MORNING 1 each 0   No current facility-administered medications for this visit.   Facility-Administered Medications Ordered in Other Visits  Medication Dose Route Frequency Provider Last Rate Last Admin   heparin lock flush 100 UNIT/ML injection              PHYSICAL EXAMINATION:  Vitals:   08/18/22 1425  BP: 112/76  Pulse: 77  Resp: 16  Temp: 99 F (37.2 C)  SpO2: 100%   Filed Weights   08/18/22 1425  Weight: 130 lb (59 kg)    Physical Exam Constitutional:      General: He is not in acute distress.    Appearance: He is ill-appearing.  HENT:     Head: Normocephalic and atraumatic.  Eyes:     General: No scleral icterus. Cardiovascular:     Rate and Rhythm: Normal rate and regular rhythm.     Heart sounds: Normal heart sounds.  Pulmonary:     Effort: Pulmonary effort is normal. No respiratory distress.     Breath sounds: No wheezing.     Comments: Decreased breath sound bilaterally. Abdominal:     General: Bowel sounds are normal. There is no distension.     Palpations: Abdomen is soft.  Musculoskeletal:        General: No deformity. Normal range of motion.     Cervical back: Normal range of motion and neck supple.  Comments: Bilateral ankle trace edema.  Skin:    General: Skin is warm and dry.     Findings: No rash.  Neurological:     Mental Status: He is alert. Mental status is at baseline.     Cranial Nerves: No cranial nerve deficit.     Coordination:  Coordination normal.  Psychiatric:        Mood and Affect: Mood normal.     LABORATORY DATA:  I have reviewed the data as listed    Latest Ref Rng & Units 08/18/2022    2:17 PM 07/29/2022    2:24 PM 07/07/2022    1:00 PM  CBC  WBC 4.0 - 10.5 K/uL 3.2  6.3  7.9   Hemoglobin 13.0 - 17.0 g/dL 40.9  81.1  91.4   Hematocrit 39.0 - 52.0 % 34.9  36.1  35.1   Platelets 150 - 400 K/uL 218  243  274       Latest Ref Rng & Units 08/18/2022    2:17 PM 07/29/2022    2:24 PM 07/07/2022    1:00 PM  CMP  Glucose 70 - 99 mg/dL 782  956  213   BUN 8 - 23 mg/dL 14  21  19    Creatinine 0.61 - 1.24 mg/dL 0.86  5.78  4.69   Sodium 135 - 145 mmol/L 134  134  136   Potassium 3.5 - 5.1 mmol/L 3.5  3.1  3.3   Chloride 98 - 111 mmol/L 104  97  100   CO2 22 - 32 mmol/L 23  29  26    Calcium 8.9 - 10.3 mg/dL 9.0  9.2  9.2   Total Protein 6.5 - 8.1 g/dL 6.7  7.0  7.1   Total Bilirubin 0.3 - 1.2 mg/dL 0.4  0.4  0.3   Alkaline Phos 38 - 126 U/L 58  63  75   AST 15 - 41 U/L 21  19  20    ALT 0 - 44 U/L 13  10  9       Iron/TIBC/Ferritin/ %Sat    Component Value Date/Time   IRON 60 02/03/2022 0958   TIBC 391 02/03/2022 0958   FERRITIN 704 (H) 02/03/2022 0958   IRONPCTSAT 15 (L) 02/03/2022 6295      RADIOGRAPHIC STUDIES: I have personally reviewed the radiological images as listed and agreed with the findings in the report. No results found.

## 2022-08-18 NOTE — Assessment & Plan Note (Signed)
Immunotherapy plan as listed above 

## 2022-08-18 NOTE — Assessment & Plan Note (Signed)
Previous MRI brain shows persistent microbleeds.   Off Eliquis 2.5mg BID.  

## 2022-08-18 NOTE — Assessment & Plan Note (Addendum)
Recommend Zometa every 4- 6 weeks.- July 2024 Continue calcium supplementation.  MRI lumbar with and without contrast showed  Stable remote superior endplate fracture of L2 and large Schmorl to noted involving L5. 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 PET scan shows stable bone lesions.  L4 fracture uptake likely benign- s/p nerve block on 6/7

## 2022-08-19 ENCOUNTER — Inpatient Hospital Stay: Payer: PPO

## 2022-08-19 ENCOUNTER — Ambulatory Visit: Payer: PPO | Admitting: Oncology

## 2022-08-19 ENCOUNTER — Other Ambulatory Visit: Payer: PPO

## 2022-08-19 ENCOUNTER — Ambulatory Visit: Payer: PPO

## 2022-08-20 ENCOUNTER — Telehealth: Payer: Self-pay | Admitting: *Deleted

## 2022-08-20 NOTE — Telephone Encounter (Addendum)
Cindy with [facility] asking for delay of care order to start services for Home Health 6/20 per patient request. I gave her the order

## 2022-08-22 DIAGNOSIS — I509 Heart failure, unspecified: Secondary | ICD-10-CM | POA: Diagnosis not present

## 2022-08-22 DIAGNOSIS — E11319 Type 2 diabetes mellitus with unspecified diabetic retinopathy without macular edema: Secondary | ICD-10-CM | POA: Diagnosis not present

## 2022-08-22 DIAGNOSIS — Z8673 Personal history of transient ischemic attack (TIA), and cerebral infarction without residual deficits: Secondary | ICD-10-CM | POA: Diagnosis not present

## 2022-08-22 DIAGNOSIS — I5030 Unspecified diastolic (congestive) heart failure: Secondary | ICD-10-CM | POA: Diagnosis not present

## 2022-08-22 DIAGNOSIS — E876 Hypokalemia: Secondary | ICD-10-CM | POA: Diagnosis not present

## 2022-08-22 DIAGNOSIS — Z9181 History of falling: Secondary | ICD-10-CM | POA: Diagnosis not present

## 2022-08-22 DIAGNOSIS — M8458XD Pathological fracture in neoplastic disease, other specified site, subsequent encounter for fracture with routine healing: Secondary | ICD-10-CM | POA: Diagnosis not present

## 2022-08-22 DIAGNOSIS — Z87891 Personal history of nicotine dependence: Secondary | ICD-10-CM | POA: Diagnosis not present

## 2022-08-22 DIAGNOSIS — G893 Neoplasm related pain (acute) (chronic): Secondary | ICD-10-CM | POA: Diagnosis not present

## 2022-08-22 DIAGNOSIS — C7951 Secondary malignant neoplasm of bone: Secondary | ICD-10-CM | POA: Diagnosis not present

## 2022-08-22 DIAGNOSIS — E1140 Type 2 diabetes mellitus with diabetic neuropathy, unspecified: Secondary | ICD-10-CM | POA: Diagnosis not present

## 2022-08-22 DIAGNOSIS — I2699 Other pulmonary embolism without acute cor pulmonale: Secondary | ICD-10-CM | POA: Diagnosis not present

## 2022-08-22 DIAGNOSIS — D519 Vitamin B12 deficiency anemia, unspecified: Secondary | ICD-10-CM | POA: Diagnosis not present

## 2022-08-22 DIAGNOSIS — D529 Folate deficiency anemia, unspecified: Secondary | ICD-10-CM | POA: Diagnosis not present

## 2022-08-22 DIAGNOSIS — E7800 Pure hypercholesterolemia, unspecified: Secondary | ICD-10-CM | POA: Diagnosis not present

## 2022-08-22 DIAGNOSIS — C3412 Malignant neoplasm of upper lobe, left bronchus or lung: Secondary | ICD-10-CM | POA: Diagnosis not present

## 2022-08-22 DIAGNOSIS — N400 Enlarged prostate without lower urinary tract symptoms: Secondary | ICD-10-CM | POA: Diagnosis not present

## 2022-08-22 DIAGNOSIS — I256 Silent myocardial ischemia: Secondary | ICD-10-CM | POA: Diagnosis not present

## 2022-08-22 DIAGNOSIS — R569 Unspecified convulsions: Secondary | ICD-10-CM | POA: Diagnosis not present

## 2022-08-22 DIAGNOSIS — E1169 Type 2 diabetes mellitus with other specified complication: Secondary | ICD-10-CM | POA: Diagnosis not present

## 2022-08-22 DIAGNOSIS — E1165 Type 2 diabetes mellitus with hyperglycemia: Secondary | ICD-10-CM | POA: Diagnosis not present

## 2022-08-22 DIAGNOSIS — E440 Moderate protein-calorie malnutrition: Secondary | ICD-10-CM | POA: Diagnosis not present

## 2022-08-22 DIAGNOSIS — F419 Anxiety disorder, unspecified: Secondary | ICD-10-CM | POA: Diagnosis not present

## 2022-08-22 DIAGNOSIS — I6523 Occlusion and stenosis of bilateral carotid arteries: Secondary | ICD-10-CM | POA: Diagnosis not present

## 2022-08-22 DIAGNOSIS — I110 Hypertensive heart disease with heart failure: Secondary | ICD-10-CM | POA: Diagnosis not present

## 2022-08-22 DIAGNOSIS — D701 Agranulocytosis secondary to cancer chemotherapy: Secondary | ICD-10-CM | POA: Diagnosis not present

## 2022-08-22 DIAGNOSIS — M5416 Radiculopathy, lumbar region: Secondary | ICD-10-CM | POA: Diagnosis not present

## 2022-08-22 DIAGNOSIS — Z7982 Long term (current) use of aspirin: Secondary | ICD-10-CM | POA: Diagnosis not present

## 2022-08-22 DIAGNOSIS — I951 Orthostatic hypotension: Secondary | ICD-10-CM | POA: Diagnosis not present

## 2022-08-27 DIAGNOSIS — M47819 Spondylosis without myelopathy or radiculopathy, site unspecified: Secondary | ICD-10-CM | POA: Diagnosis not present

## 2022-08-27 DIAGNOSIS — M47896 Other spondylosis, lumbar region: Secondary | ICD-10-CM | POA: Diagnosis not present

## 2022-08-27 DIAGNOSIS — M47816 Spondylosis without myelopathy or radiculopathy, lumbar region: Secondary | ICD-10-CM | POA: Diagnosis not present

## 2022-08-27 DIAGNOSIS — C7800 Secondary malignant neoplasm of unspecified lung: Secondary | ICD-10-CM | POA: Diagnosis not present

## 2022-08-29 ENCOUNTER — Other Ambulatory Visit: Payer: Self-pay | Admitting: Oncology

## 2022-09-01 ENCOUNTER — Encounter: Payer: Self-pay | Admitting: Oncology

## 2022-09-01 MED ORDER — FENTANYL 75 MCG/HR TD PT72: 1.0000 | MEDICATED_PATCH | TRANSDERMAL | 0 refills | Status: DC

## 2022-09-05 ENCOUNTER — Other Ambulatory Visit: Payer: Self-pay | Admitting: Oncology

## 2022-09-05 NOTE — Telephone Encounter (Signed)
Component Ref Range & Units 2 wk ago (08/18/22) 1 mo ago (07/29/22) 2 mo ago (07/07/22) 2 mo ago (06/16/22) 3 mo ago (05/26/22) 4 mo ago (05/05/22) 4 mo ago (04/14/22)  Potassium 3.5 - 5.1 mmol/L 3.5 3.1 Low  3.3 Low  3.6 3.9 3.6 4.2

## 2022-09-08 ENCOUNTER — Encounter: Payer: Self-pay | Admitting: Oncology

## 2022-09-08 ENCOUNTER — Inpatient Hospital Stay (HOSPITAL_BASED_OUTPATIENT_CLINIC_OR_DEPARTMENT_OTHER): Payer: PPO | Admitting: Oncology

## 2022-09-08 ENCOUNTER — Inpatient Hospital Stay: Payer: PPO | Attending: Radiation Oncology

## 2022-09-08 ENCOUNTER — Inpatient Hospital Stay: Payer: PPO

## 2022-09-08 VITALS — BP 127/62 | HR 75 | Temp 96.2°F | Wt 133.0 lb

## 2022-09-08 DIAGNOSIS — Z5112 Encounter for antineoplastic immunotherapy: Secondary | ICD-10-CM

## 2022-09-08 DIAGNOSIS — Z87891 Personal history of nicotine dependence: Secondary | ICD-10-CM | POA: Insufficient documentation

## 2022-09-08 DIAGNOSIS — I2699 Other pulmonary embolism without acute cor pulmonale: Secondary | ICD-10-CM | POA: Diagnosis not present

## 2022-09-08 DIAGNOSIS — I619 Nontraumatic intracerebral hemorrhage, unspecified: Secondary | ICD-10-CM | POA: Diagnosis not present

## 2022-09-08 DIAGNOSIS — C7931 Secondary malignant neoplasm of brain: Secondary | ICD-10-CM | POA: Diagnosis not present

## 2022-09-08 DIAGNOSIS — E876 Hypokalemia: Secondary | ICD-10-CM | POA: Insufficient documentation

## 2022-09-08 DIAGNOSIS — C3412 Malignant neoplasm of upper lobe, left bronchus or lung: Secondary | ICD-10-CM | POA: Insufficient documentation

## 2022-09-08 DIAGNOSIS — M549 Dorsalgia, unspecified: Secondary | ICD-10-CM | POA: Diagnosis not present

## 2022-09-08 DIAGNOSIS — G629 Polyneuropathy, unspecified: Secondary | ICD-10-CM | POA: Diagnosis not present

## 2022-09-08 DIAGNOSIS — Z79620 Long term (current) use of immunosuppressive biologic: Secondary | ICD-10-CM | POA: Insufficient documentation

## 2022-09-08 DIAGNOSIS — G8929 Other chronic pain: Secondary | ICD-10-CM

## 2022-09-08 DIAGNOSIS — C7951 Secondary malignant neoplasm of bone: Secondary | ICD-10-CM

## 2022-09-08 LAB — COMPREHENSIVE METABOLIC PANEL
ALT: 20 U/L (ref 0–44)
AST: 25 U/L (ref 15–41)
Albumin: 3.2 g/dL — ABNORMAL LOW (ref 3.5–5.0)
Alkaline Phosphatase: 67 U/L (ref 38–126)
Anion gap: 8 (ref 5–15)
BUN: 17 mg/dL (ref 8–23)
CO2: 22 mmol/L (ref 22–32)
Calcium: 8.8 mg/dL — ABNORMAL LOW (ref 8.9–10.3)
Chloride: 102 mmol/L (ref 98–111)
Creatinine, Ser: 1 mg/dL (ref 0.61–1.24)
GFR, Estimated: >60 mL/min (ref >60)
Glucose, Bld: 201 mg/dL — ABNORMAL HIGH (ref 70–99)
Potassium: 3 mmol/L — ABNORMAL LOW (ref 3.5–5.1)
Sodium: 132 mmol/L — ABNORMAL LOW (ref 135–145)
Total Bilirubin: 0.3 mg/dL (ref 0.3–1.2)
Total Protein: 7.2 g/dL (ref 6.5–8.1)

## 2022-09-08 LAB — CBC WITH DIFFERENTIAL/PLATELET
Abs Immature Granulocytes: 0.02 10*3/uL (ref 0.00–0.07)
Basophils Absolute: 0 10*3/uL (ref 0.0–0.1)
Basophils Relative: 1 %
Eosinophils Absolute: 0.2 10*3/uL (ref 0.0–0.5)
Eosinophils Relative: 3 %
HCT: 30 % — ABNORMAL LOW (ref 39.0–52.0)
Hemoglobin: 9.9 g/dL — ABNORMAL LOW (ref 13.0–17.0)
Immature Granulocytes: 1 %
Lymphocytes Relative: 12 %
Lymphs Abs: 0.6 10*3/uL — ABNORMAL LOW (ref 0.7–4.0)
MCH: 29.4 pg (ref 26.0–34.0)
MCHC: 33 g/dL (ref 30.0–36.0)
MCV: 89 fL (ref 80.0–100.0)
Monocytes Absolute: 0.4 10*3/uL (ref 0.1–1.0)
Monocytes Relative: 9 %
Neutro Abs: 3.3 10*3/uL (ref 1.7–7.7)
Neutrophils Relative %: 74 %
Platelets: 334 10*3/uL (ref 150–400)
RBC: 3.37 MIL/uL — ABNORMAL LOW (ref 4.22–5.81)
RDW: 12.8 % (ref 11.5–15.5)
WBC: 4.4 10*3/uL (ref 4.0–10.5)
nRBC: 0 % (ref 0.0–0.2)

## 2022-09-08 MED ORDER — HEPARIN SOD (PORK) LOCK FLUSH 100 UNIT/ML IV SOLN
500.0000 [IU] | Freq: Once | INTRAVENOUS | Status: DC | PRN
  Filled 2022-09-08: qty 5

## 2022-09-08 MED ORDER — SODIUM CHLORIDE 0.9 % IV SOLN
Freq: Once | INTRAVENOUS | Status: AC
  Filled 2022-09-08: qty 250

## 2022-09-08 MED ORDER — ALTEPLASE 2 MG IJ SOLR
2.0000 mg | Freq: Once | INTRAMUSCULAR | Status: AC | PRN
  Administered 2022-09-08: 2 mg
  Filled 2022-09-08: qty 2

## 2022-09-08 MED ORDER — SODIUM CHLORIDE 0.9 % IV SOLN
200.0000 mg | Freq: Once | INTRAVENOUS | Status: AC
  Administered 2022-09-08: 200 mg via INTRAVENOUS
  Filled 2022-09-08: qty 200

## 2022-09-08 NOTE — Assessment & Plan Note (Addendum)
Recommend Zometa every 4- 6 weeks.-  hold off today due to possible upcoming surgery Continue calcium supplementation.  MRI lumbar with and without contrast showed  Stable remote superior endplate fracture of L2 and large Schmorl to noted involving L5. 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 PET scan shows stable bone lesions.  L4 fracture uptake likely benign- s/p nerve block on 6/7

## 2022-09-08 NOTE — Progress Notes (Signed)
Hematology/Oncology Progress note [phone] [phone]       CHIEF COMPLAINTS/REASON FOR VISIT:  Metastatic non-small cell lung cancer  ASSESSMENT & PLAN:   Cancer Staging  Primary non-small cell carcinoma of upper lobe of left lung (HCC) Staging form: Lung, AJCC 8th Edition - Clinical: Stage IV (cT2b, cN1, cM1) - Signed by [name], MD on 08/06/2021   Neuropathy On Gabapentin 300mg  TID  Fingertip neuropathy intermittent Grade 1, feet neuropathy persistent, Grade 2.  I recommend to further increase gabapentin dosage. He would like to wait and further discuss with pcp  Primary non-small cell carcinoma of upper lobe of left lung (HCC) Stage IV lung adenocarcinoma with brain and bone metastasis.  Labs are reviewed and discussed with patient. Proceed with Keytruda S/p lung radiation to left lung.   Intraparenchymal hemorrhage of brain (HCC) Hold Eliquis.  Follow-up with Dr. Barbaraann Cao Repeat MRI brain-stable findings  Pulmonary embolus [facility]) Previous MRI brain shows persistent microbleeds.   Off Eliquis 2.5mg  BID.   Metastasis to bone (HCC) Recommend Zometa every 4- 6 weeks.-  hold off today due to possible upcoming surgery Continue calcium supplementation.  MRI lumbar with and without contrast showed  Stable remote superior endplate fracture of L2 and large Schmorl to noted involving L5. 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 PET scan shows stable bone lesions.  L4 fracture uptake likely benign- s/p nerve block on 6/7   Back pain Back pain, MRI lumbar showed compression fracture Continue PRN percocet 7.5mg /325mg , fentanyl patch Q72 hours MRI lumbar showed DJD, L4-L5 synovial cyst,PET scan showed L4 fracture    Encounter for antineoplastic immunotherapy Immunotherapy plan as listed above  Hypokalemia Due to lasix use.  Recommend potassium daily - he is not  compliant    Follow up 3 weeks lab MD Keytruda. All questions were answered. The patient knows to call the clinic with any problems, questions or concerns.  [name], MD, [name] [facility] Hematology Oncology 09/08/2022      HISTORY OF PRESENTING ILLNESS:   Micheal Donovan is a  72 y.o.  male presents for follow up of Non-small cell lung cancer.  Oncology History Overview Note  Diagnosis: Stage IIB T2b N1 M0 adenocarcinoma of the LUL, poorly differentiated     Primary non-small cell carcinoma of upper lobe of left lung (HCC)  06/13/2021 Initial Diagnosis   Primary non-small cell carcinoma of upper lobe of left lung [facility]) -06/20/2021 - 06/27/2021, patient presented to [facility] due to progressive headache/dizziness/gait changes. CT head showed acute to subacute intraparenchymal hemorrhage involving the left cerebellum.  Surrounding low-density vasogenic edema.  Patient was transferred to [facility]. This was further evaluated by CT angiogram of the neck which showed bulky calcified plaques/stenosis of carotid artery, Followed by MRI brain. 06/12/2021, MRI of the brain showed no significant interval change in size of the left cerebellar intraparenchymal hematoma with unchanged regional mass effect and partial effacement of fourth ventricle but no upstream hydrocephalus. There is no discernible enhancement to suggest underlying mass lesion, though acute blood products could mask enhancement.   06/12/2021 a chest x-ray showed a 4.4 cm left middle lobe. 06/13/2021, CT chest with contrast showed a 4.3 x 3.6 x 3.3 cm lobular spiculated mass in the posterior left upper lobe with T3 to the lateral pleura and major fissure.  Metastatic left hilar lymphadenopathy.  Peripheral micronodularity posterior right costophrenic sulcus.  Aortic atherosclerosis. 06/14/2021, CT  abdomen pelvis showed right lower lobe pulmonary artery embolus.  No evidence of right heart strain.  No acute intra-abdominal or  pelvic pathology.  Aortic atherosclerosis.  06/13/2021, patient underwent bronchoscopy with EBUS by Dr. Katrinka Blazing.  Biopsy from the fine-needle aspiration of station 11 mL showed malignant cells, consistent with poorly differentiated non-small cell carcinoma, consistent with adenocarcinoma.  Malignant cells are TTF-1 positive and negative for p40.  Negative for neuroendocrine markers.  Blood test and tissue sample were tested for Gardant 360  -PD-L1 TPS 97%, no actionable mutation on the blood testing. 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   07/17/2021 Cancer Staging   Staging form: Lung, AJCC 8th Edition - Clinical: Stage IV (cT2b, cN1, cM1) - Signed by [name], MD on 08/06/2021   08/06/2021 Imaging   I saw patient's PET scan after his visit with me on 08/06/21. PET scan was ordered by his previous oncologist group and did not come to my in basket.. Patient received cycle 1 carboplatin Taxol today.  He has started on radiation.  5/26/3 PET scan showed 3.8 cm hypermetabolic left upper lobe mass with hypermetabolic left hilar and infrahilar adenopathy.  There is approximately 8 scattered metastatic lesions in the skeleton. Mixed density photopenic lesion in the left cerebellum. characterized as hemorrhage on recent prior imaging workups.    08/16/2021 -  Chemotherapy   Patient is on Treatment Plan : LUNG Carboplatin (5) + Pemetrexed + Pembrolizumab (200) D1 q21d Induction x 4 cycles / Maintenance Pemetrexed + Pembrolizumab (200) D1 q21d      08/22/2021 Imaging   MRI brain w wo contrast  Decrease in size of left cerebellar hematoma with resolution of edema. No evidence of underlying lesion. Smaller, more recent hemorrhage in the left cerebellar vermis with minimal edema. There is minimal enhancement without definite evidence of underlying lesion.  New punctate focus of chronic blood products and enhancement in the left frontal lobe. Additional new foci of chronic blood products in the  posterior right putamen, right parietal subcortical white matter, and posterior right cerebellum. Unclear at this time if these represent foci of bland hemorrhage or early metastases.   Increase in size of right parietal osseous metastasis with minor extraosseous extension.     Imaging   PET scan showed 1. Interval response to therapy as evidenced by a small residual left upper lobe nodule with decreased hypermetabolism, no residual hypermetabolic adenopathy and decreased hypermetabolism associatedwith osseous metastases. 2. 6 mm posterior left upper lobe nodule, likely stable. Recommend attention on follow-up. 3. Aortic atherosclerosis (ICD10-I70.0). Coronary artery calcification.   12/17/2021 Imaging   MRI lumbar spine w wo contrast  1. No evidence of regional metastatic disease. 2. Late subacute superior endplate fracture at L2 and large superior endplate Schmorl's node at L5, unchanged since the PET scan of 10/29/2021. 3. L3-4: Shallow disc protrusion. Facet and ligamentous hypertrophy. Stenosis of both lateral recesses. Findings slightly worsened since 2022. 4. L4-5: Shallow disc protrusion. Facet and ligamentous hypertrophy. Small synovial cyst arising from the facet joint on the right. Stenosis of the lateral recesses right worse than left. Foraminal narrowing right worse than left. Findings have worsened since 2022.  5. L5-S1: Endplate osteophytes and shallow protrusion of the disc. Facet and ligamentous hypertrophy. Stenosis of the subarticular lateral recesses and neural foramina, right worse than left. Similar appearance to the study of 2022. 6. Continued evidence of enteritis of at least 1 loop of small bowel. Distended bladder present   12/18/2021 Imaging   Ar[facility]  MRI w wo  1. Interval decrease in size of the left cerebellar and vermian hematomas. No definite evidence of underlying metastatic lesion. 2. There is are two possible contrast enhancing lesions in the posterior left  frontal lobe and left occipital lobe, which do not have intrinsic T1 signal abnormality, but have a somewhat linear appearance and may be vascular in nature. Recommend attention on follow up. 3. Multifocal sites of susceptibility artifact are redemonstrated with interval development of a few new foci, as described above. All of these sites demonstrate intrinsic T1 hyperintense signal abnormality and susceptibility artifact and are compatible with sites of microhemorrhages. Recommend continued attention on follow up. 4. Interval decrease in size of right parietal calvarial metastatic lesion. No new contrast enhancing lesions visualized. 5. Diffusely heterogeneous marrow signal throughout the cervical spine, which is nonspecific but can be seen in the setting of anemia, smoking, obesity, or a marrow replacement process.     12/18/2021 Imaging   MRI lumbar spine w wo contrast  1. No evidence of regional metastatic disease. 2. Late subacute superior endplate fracture at L2 and large superior endplate Schmorl's node at L5, unchanged since the PET scan of 10/29/2021 3. L3-4: Shallow disc protrusion. Facet and ligamentous hypertrophy.Stenosis of both lateral recesses. Findings slightly worsened since 2022. 4. L4-5: Shallow disc protrusion. Facet and ligamentous hypertrophy. Small synovial cyst arising from the facet joint on the right. Stenosis of the lateral recesses right worse than left. Foraminal narrowing right worse than left. Findings have worsened since 2022. 5. L5-S1: Endplate osteophytes and shallow protrusion of the disc. Facet and ligamentous hypertrophy. Stenosis of the subarticular lateral recesses and neural foramina, right worse than left. Similar appearance to the study of 2022. 6. Continued evidence of enteritis of at least 1 loop of small bowel. Distended bladder present.       01/28/2022 Imaging   CT chest abdomen pelvis w contrast 1. Treated left upper lobe mass appears grossly  stable to the prior examination when measured in a similar fashion on the prior study. No definite signs of extra skeletal metastatic disease noted in the chest, abdomen or pelvis. 2. Widespread skeletal metastases redemonstrated, as above. 3. Hepatic steatosis. 4. Aortic atherosclerosis, in addition to left main and three-vessel coronary artery disease. Assessment for potential risk factor modification, dietary therapy or pharmacologic therapy may be warranted, if clinically indicated. 5. Additional incidental findings,    04/08/2022 Imaging   MRI lumbar spine w wo contrast  1. Stable remote superior endplate fracture of L2 and large Schmorl to noted involving L5. No worrisome bone lesions to suggest metastatic disease. 2. Degenerative lumbar spondylosis with multilevel disc disease and facet disease which appears relatively stable as detailed above. 3. Enlarging right-sided synovial cyst at L4-5 with progressive mass effect on the right side of the thecal sac. This could potentially be symptomatic   04/28/2022 Imaging   CT chest abdomen pelvis wo contrast  1. Similar versus slightly decreased size of treated left upper lobe primary bronchogenic carcinoma. 2. Similar osseous metastasis. 3. No evidence of extraosseous metastatic disease. 4. New small right pleural effusion. 5. Coronary artery atherosclerosis. Aortic Atherosclerosis   07/03/2022 Imaging   PET scan showed 1. Interval development of a small focus of intense hypermetabolism within the left upper lobe scar. This is associated with a 5 mm perifissural nodule in the left lung which is hypermetabolic. Hypermetabolism at both of these locations is new since prior PET-CT and highly suspicious for recurrent disease. 2. No evidence  for hypermetabolic soft tissue metastatic disease in the neck, abdomen, or pelvis. 3. Similar appearance of sclerotic and lytic bone lesions without hypermetabolism on PET imaging. 4. Focal hypermetabolism  associated with a fracture of the right L4 transverse process. The fracture was visible on the previous CT of 04/28/2022. No associated soft tissue lesion to suggest that this is pathologic in nature. Tracer accumulation on today's study is in keeping with healing fracture. There is also some minimal uptake in a prominent spur associated with the L4 superior endplate, most likely degenerative. 5.  Aortic Atherosclerosis    08/01/2022 - 08/14/2022 Radiation Therapy   Radiation to left lung.    # Patient has a history of melanoma on his neck, treated in 2009. # History of hemorrhagic infarct left cerebellum  INTERVAL HISTORY Micheal Donovan is a 72 y.o. male who has above history reviewed by me today presents for follow up visit for management of  Stage IV lung adenocarcianoma # Pulmonary embolism, SOB is stable, not worse. Off Eliquis due to MRI findings.  + back pain, and left lower extremity weakness due to pain, on fentanyl patch Q72 hours and percocet, he sees orthopedic surgeon for epidural injections, he is going to see neurosurgeon for further evaluation of possible surgery.  Accompanied by wife.  No fever or chills.     Review of Systems  Constitutional:  Positive for appetite change, fatigue and unexpected weight change. Negative for chills and fever.  HENT:   Negative for hearing loss and voice change.   Eyes:  Negative for eye problems and icterus.  Respiratory:  Positive for shortness of breath. Negative for chest tightness and cough.   Cardiovascular:  Positive for leg swelling. Negative for chest pain.  Gastrointestinal:  Negative for abdominal distention, abdominal pain and blood in stool.  Endocrine: Negative for hot flashes.  Genitourinary:  Negative for difficulty urinating, dysuria and frequency.   Musculoskeletal:  Positive for back pain. Negative for arthralgias.  Skin:  Negative for itching and rash.  Neurological:  Negative for extremity weakness, headaches,  light-headedness and numbness.  Hematological:  Negative for adenopathy. Does not bruise/bleed easily.  Psychiatric/Behavioral:  Positive for sleep disturbance. Negative for confusion.     MEDICAL HISTORY:  Past Medical History:  Diagnosis Date   Allergy    BPH (benign prostatic hypertrophy)    Cancer (HCC)    Melanoma on Neck    2008   Carotid artery occlusion    CHF (congestive heart failure) (HCC)    Diabetes mellitus    type 2   ED (erectile dysfunction)    GERD (gastroesophageal reflux disease)    Hemorrhagic stroke (HCC) 06/2021   Hyperlipidemia    Hypertension    Lung cancer (HCC)    Neoplasm related pain    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)     SURGICAL HISTORY: Past Surgical History:  Procedure Laterality Date   BRONCHIAL NEEDLE ASPIRATION BIOPSY  06/17/2021   Procedure: BRONCHIAL NEEDLE ASPIRATION BIOPSIES;  Surgeon: Lorin Glass, MD;  Location: [facility] ENDOSCOPY;  Service: Pulmonary;;   IR IMAGING GUIDED PORT INSERTION  08/05/2021   MELANOMA EXCISION  2008   Left side of neck   RADIOLOGY WITH ANESTHESIA N/A 04/05/2020   Procedure: MRI SPINE WITOUT CONTRAST;  Surgeon: Radiologist, Medication, MD;  Location: [facility] OR;  Service: Radiology;  Laterality: N/A;   RADIOLOGY WITH ANESTHESIA N/A 08/22/2021   Procedure: MRI BRAIN WITH AND WITHOUT CONTRAST  WITH ANESTHESIA;  Surgeon: Radiologist,  Medication, MD;  Location: [facility] OR;  Service: Radiology;  Laterality: N/A;   RADIOLOGY WITH ANESTHESIA N/A 12/17/2021   Procedure: MRI BRAIN WITH AND WITHOUT CONTRAST WITH ANESTHESIA; MRI LUMBER WITH AND WITHOUT CONTRAST;  Surgeon: Radiologist, Medication, MD;  Location: [facility] OR;  Service: Radiology;  Laterality: N/A;   RADIOLOGY WITH ANESTHESIA N/A 04/08/2022   Procedure: MRI LUMBER SPINE WITH AND WITHOUT CONTRAST;  Surgeon: Radiologist, Medication, MD;  Location: [facility] OR;  Service: Radiology;  Laterality: N/A;   TONSILLECTOMY     VIDEO BRONCHOSCOPY WITH ENDOBRONCHIAL ULTRASOUND N/A 06/17/2021    Procedure: VIDEO BRONCHOSCOPY WITH ENDOBRONCHIAL ULTRASOUND;  Surgeon: Lorin Glass, MD;  Location: [facility] ENDOSCOPY;  Service: Pulmonary;  Laterality: N/A;    SOCIAL HISTORY: Social History   Socioeconomic History   Marital status: Married    Spouse name: Not on file   Number of children: Not on file   Years of education: Not on file   Highest education level: Not on file  Occupational History   Not on file  Tobacco Use   Smoking status: Former    Types: Cigarettes    Quit date: 07/21/1990    Years since quitting: 32.1   Smokeless tobacco: Never  Vaping Use   Vaping Use: Never used  Substance and Sexual Activity   Alcohol use: No   Drug use: No   Sexual activity: Not Currently  Other Topics Concern   Not on file  Social History Narrative   Not on file   Social Determinants of Health   Financial Resource Strain: Low Risk  (04/17/2022)   Overall Financial Resource Strain (CARDIA)    Difficulty of Paying Living Expenses: Not hard at all  Food Insecurity: No Food Insecurity (04/17/2022)   Hunger Vital Sign    Worried About Running Out of Food in the Last Year: Never true    Ran Out of Food in the Last Year: Never true  Transportation Needs: No Transportation Needs (03/27/2022)   PRAPARE - Administrator, Civil Service (Medical): No    Lack of Transportation (Non-Medical): No  Physical Activity: Inactive (03/27/2022)   Exercise Vital Sign    Days of Exercise per Week: 0 days    Minutes of Exercise per Session: 0 min  Stress: Stress Concern Present (03/27/2022)   [employer] of Occupational Health - Occupational Stress Questionnaire    Feeling of Stress : To some extent  Social Connections: Moderately Isolated (03/27/2022)   Social Connection and Isolation Panel [NHANES]    Frequency of Communication with Friends and Family: More than three times a week    Frequency of Social Gatherings with Friends and Family: Three times a week    Attends Religious  Services: Never    Active Member of Clubs or Organizations: No    Attends Banker Meetings: Never    Marital Status: Married  Catering manager Violence: Not At Risk (03/27/2022)   Humiliation, Afraid, Rape, and Kick questionnaire    Fear of Current or Ex-Partner: No    Emotionally Abused: No    Physically Abused: No    Sexually Abused: No    FAMILY HISTORY: Family History  Problem Relation Age of Onset   COPD Mother    Heart disease Father    Heart disease Brother        MI at age 66   Stroke Neg Hx     ALLERGIES:  is allergic to niaspan [niacin].  MEDICATIONS:  Current Outpatient Medications  Medication Sig Dispense Refill   ALPRAZolam (XANAX) 0.5 MG tablet Take 1 tablet (0.5 mg total) by mouth daily as needed for anxiety (prior to radiation.). 30 tablet 0   aspirin EC 81 MG tablet Take 1 tablet (81 mg total) by mouth daily. Swallow whole. 30 tablet 12   atorvastatin (LIPITOR) 40 MG tablet TAKE 1 TABLET(40 MG) BY MOUTH DAILY 60 tablet 2   Ca Phosphate-Cholecalciferol (CALCIUM WITH D3 PO) Take 1 capsule by mouth daily.     fentaNYL (DURAGESIC) 75 MCG/HR Place 1 patch onto the skin every 3 (three) days. 10 patch 0   folic acid (FOLVITE) 800 MCG tablet Take 1 tablet (800 mcg total) by mouth daily. 60 tablet 1   furosemide (LASIX) 20 MG tablet TAKE 1 TABLET(20 MG) BY MOUTH DAILY AS NEEDED FOR LEG SWELLING OR SHORTNESS OF BREATH 90 tablet 0   gabapentin (NEURONTIN) 300 MG capsule Take 1 capsule (300 mg total) by mouth 3 (three) times daily. 90 capsule 1   gatifloxacin (ZYMAXID) 0.5 % SOLN Place 1 drop into the left eye 4 (four) times daily.     insulin glargine (LANTUS) 100 UNIT/ML injection Inject 30 Units into the skin daily as needed. Only takes Lantus when sugar is above 150.     megestrol (MEGACE) 40 MG tablet Take 1 tablet (40 mg total) by mouth daily. 30 tablet 0   NOVOLOG FLEXPEN 100 UNIT/ML FlexPen Inject 0-10 Units into the skin daily as needed for high blood  sugar (above 200). Sliding scale     nystatin (MYCOSTATIN) 100000 UNIT/ML suspension Take 5 mLs (500,000 Units total) by mouth 4 (four) times daily. Swish and spit 473 mL 2   omeprazole (PRILOSEC) 20 MG capsule Take 20 mg by mouth daily.     oxyCODONE-acetaminophen (PERCOCET) 7.5-325 MG tablet Take 1 tablet by mouth every 4 (four) hours as needed for severe pain. Do not take with cough medication or other pain medication 60 tablet 0   potassium chloride SA (KLOR-CON M) 20 MEQ tablet TAKE 1 TABLET(20 MEQ) BY MOUTH DAILY 90 tablet 0   Probiotic Product (PROBIOTIC DAILY PO) Take by mouth. Physician's Choice Probiotic     tamsulosin (FLOMAX) 0.4 MG CAPS capsule TAKE 1 CAPSULE(0.4 MG) BY MOUTH DAILY 30 capsule 0   traZODone (DESYREL) 50 MG tablet Take 1 tablet (50 mg total) by mouth at bedtime as needed for sleep. 30 tablet 3   UNABLE TO FIND PLEASE CHECK FASTING BLOOD GLUCOSE EVERY MORNING 1 each 0   No current facility-administered medications for this visit.   Facility-Administered Medications Ordered in Other Visits  Medication Dose Route Frequency Provider Last Rate Last Admin   heparin lock flush 100 UNIT/ML injection            heparin lock flush 100 unit/mL  500 Units Intracatheter Once PRN [name], MD         PHYSICAL EXAMINATION:  Vitals:   09/08/22 1450  BP: 127/62  Pulse: 75  Temp: (!) 96.2 F (35.7 C)  SpO2: 100%   Filed Weights   09/08/22 1450  Weight: 133 lb (60.3 kg)    Physical Exam Constitutional:      General: He is not in acute distress.    Appearance: He is ill-appearing.  HENT:     Head: Normocephalic and atraumatic.  Eyes:     General: No scleral icterus. Cardiovascular:     Rate and Rhythm: Normal rate and regular rhythm.     Heart sounds:  Normal heart sounds.  Pulmonary:     Effort: Pulmonary effort is normal. No respiratory distress.     Breath sounds: No wheezing.     Comments: Decreased breath sound bilaterally. Abdominal:     General: Bowel  sounds are normal. There is no distension.     Palpations: Abdomen is soft.  Musculoskeletal:        General: No deformity. Normal range of motion.     Cervical back: Normal range of motion and neck supple.     Comments: Bilateral ankle trace edema.  Skin:    General: Skin is warm and dry.     Findings: No rash.  Neurological:     Mental Status: He is alert. Mental status is at baseline.     Cranial Nerves: No cranial nerve deficit.     Coordination: Coordination normal.  Psychiatric:        Mood and Affect: Mood normal.     LABORATORY DATA:  I have reviewed the data as listed    Latest Ref Rng & Units 09/08/2022    2:06 PM 08/18/2022    2:17 PM 07/29/2022    2:24 PM  CBC  WBC 4.0 - 10.5 K/uL 4.4  3.2  6.3   Hemoglobin 13.0 - 17.0 g/dL 9.9  16.1  09.6   Hematocrit 39.0 - 52.0 % 30.0  34.9  36.1   Platelets 150 - 400 K/uL 334  218  243       Latest Ref Rng & Units 09/08/2022    2:06 PM 08/18/2022    2:17 PM 07/29/2022    2:24 PM  CMP  Glucose 70 - 99 mg/dL 045  409  811   BUN 8 - 23 mg/dL 17  14  21    Creatinine 0.61 - 1.24 mg/dL 9.14  7.82  9.56   Sodium 135 - 145 mmol/L 132  134  134   Potassium 3.5 - 5.1 mmol/L 3.0  3.5  3.1   Chloride 98 - 111 mmol/L 102  104  97   CO2 22 - 32 mmol/L 22  23  29    Calcium 8.9 - 10.3 mg/dL 8.8  9.0  9.2   Total Protein 6.5 - 8.1 g/dL 7.2  6.7  7.0   Total Bilirubin 0.3 - 1.2 mg/dL 0.3  0.4  0.4   Alkaline Phos 38 - 126 U/L 67  58  63   AST 15 - 41 U/L 25  21  19    ALT 0 - 44 U/L 20  13  10       Iron/TIBC/Ferritin/ %Sat    Component Value Date/Time   IRON 60 02/03/2022 0958   TIBC 391 02/03/2022 0958   FERRITIN 704 (H) 02/03/2022 0958   IRONPCTSAT 15 (L) 02/03/2022 2130      RADIOGRAPHIC STUDIES: I have personally reviewed the radiological images as listed and agreed with the findings in the report. No results found.

## 2022-09-08 NOTE — Assessment & Plan Note (Addendum)
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  Labs are reviewed and discussed with patient. Proceed with Keytruda S/p lung radiation to left lung.

## 2022-09-08 NOTE — Assessment & Plan Note (Signed)
Back pain, MRI lumbar showed compression fracture Continue PRN percocet 7.5mg/325mg, fentanyl patch 75mcg Q72 hours MRI lumbar showed DJD, L4-L5 synovial cyst,PET scan showed L4 fracture   

## 2022-09-08 NOTE — Assessment & Plan Note (Addendum)
On Gabapentin 300mg  TID  Fingertip neuropathy intermittent Grade 1, feet neuropathy persistent, Grade 2.  I recommend to further increase gabapentin dosage. He would like to wait and further discuss with pcp

## 2022-09-08 NOTE — Assessment & Plan Note (Signed)
Hold Eliquis.  Follow-up with Dr. Vaslow Repeat MRI brain-stable findings 

## 2022-09-08 NOTE — Assessment & Plan Note (Signed)
Immunotherapy plan as listed above 

## 2022-09-08 NOTE — Patient Instructions (Signed)
[facility] [place] [facility]  Discharge Instructions: Thank you for choosing [facility] to provide your oncology and hematology care.  If you have a lab appointment with the Cancer Center, please go directly to the Cancer Center and check in at the registration area.  Wear comfortable clothing and clothing appropriate for easy access to any Portacath or PICC line.   We strive to give you quality time with your provider. You may need to reschedule your appointment if you arrive late (15 or more minutes).  Arriving late affects you and other patients whose appointments are after yours.  Also, if you miss three or more appointments without notifying the office, you may be dismissed from the clinic at the provider's discretion.      For prescription refill requests, have your pharmacy contact our office and allow 72 hours for refills to be completed.    Today you received the following chemotherapy and/or immunotherapy agents- Keytruda      To help prevent nausea and vomiting after your treatment, we encourage you to take your nausea medication as directed.  BELOW ARE SYMPTOMS THAT SHOULD BE REPORTED IMMEDIATELY: *FEVER GREATER THAN 100.4 F (38 C) OR HIGHER *CHILLS OR SWEATING *NAUSEA AND VOMITING THAT IS NOT CONTROLLED WITH YOUR NAUSEA MEDICATION *UNUSUAL SHORTNESS OF BREATH *UNUSUAL BRUISING OR BLEEDING *URINARY PROBLEMS (pain or burning when urinating, or frequent urination) *BOWEL PROBLEMS (unusual diarrhea, constipation, pain near the anus) TENDERNESS IN MOUTH AND THROAT WITH OR WITHOUT PRESENCE OF ULCERS (sore throat, sores in mouth, or a toothache) UNUSUAL RASH, SWELLING OR PAIN  UNUSUAL VAGINAL DISCHARGE OR ITCHING   Items with * indicate a potential emergency and should be followed up as soon as possible or go to the Emergency Department if any problems should occur.  Please show the CHEMOTHERAPY ALERT CARD or IMMUNOTHERAPY ALERT CARD at check-in to  the Emergency Department and triage nurse.  Should you have questions after your visit or need to cancel or reschedule your appointment, please contact [facility] [place] [facility]  [phone] and follow the prompts.  Office hours are 8:00 a.m. to 4:30 p.m. Monday - Friday. Please note that voicemails left after 4:00 p.m. may not be returned until the following business day.  We are closed weekends and major holidays. You have access to a nurse at all times for urgent questions. Please call the main number to the clinic [phone] and follow the prompts.  For any non-urgent questions, you may also contact your provider using MyChart. We now offer e-Visits for anyone 18 and older to request care online for non-urgent symptoms. For details visit [email or web address].   Also download the MyChart app! Go to the app store, search "MyChart", open the app, select [facility], and log in with your MyChart username and password.   

## 2022-09-08 NOTE — Assessment & Plan Note (Signed)
Previous MRI brain shows persistent microbleeds.   Off Eliquis 2.5mg BID.  

## 2022-09-08 NOTE — Assessment & Plan Note (Addendum)
Due to lasix use.  Recommend potassium daily - he is not compliant

## 2022-09-09 ENCOUNTER — Ambulatory Visit: Payer: PPO

## 2022-09-09 ENCOUNTER — Other Ambulatory Visit: Payer: PPO

## 2022-09-09 ENCOUNTER — Encounter: Payer: Self-pay | Admitting: Family Medicine

## 2022-09-09 ENCOUNTER — Ambulatory Visit: Payer: PPO | Admitting: Oncology

## 2022-09-09 ENCOUNTER — Ambulatory Visit (INDEPENDENT_AMBULATORY_CARE_PROVIDER_SITE_OTHER): Payer: PPO | Admitting: Family Medicine

## 2022-09-09 VITALS — BP 128/68 | HR 88 | Temp 98.3°F | Ht 67.0 in | Wt 132.2 lb

## 2022-09-09 DIAGNOSIS — N3281 Overactive bladder: Secondary | ICD-10-CM | POA: Diagnosis not present

## 2022-09-09 DIAGNOSIS — G629 Polyneuropathy, unspecified: Secondary | ICD-10-CM | POA: Diagnosis not present

## 2022-09-09 MED ORDER — SOLIFENACIN SUCCINATE 10 MG PO TABS
10.0000 mg | ORAL_TABLET | Freq: Every day | ORAL | 11 refills | Status: DC
Start: 2022-09-09 — End: 2022-10-06

## 2022-09-09 MED ORDER — POTASSIUM CHLORIDE CRYS ER 10 MEQ PO TBCR: 20.0000 meq | EXTENDED_RELEASE_TABLET | Freq: Every day | ORAL | 11 refills | Status: DC

## 2022-09-09 NOTE — Progress Notes (Signed)
Subjective:    Patient ID: Micheal Donovan, male    DOB: 01-Mar-1951, 72 y.o.   MRN: [id number]  Back Pain  04/21/22 Patient is a 72 year old Caucasian gentleman with a history of metastatic lung cancer who recently had an MRI on his lower back: IMPRESSION: 1. No evidence of regional metastatic disease. 2. Late subacute superior endplate fracture at L2 and large superior endplate Schmorl's node at L5, unchanged since the PET scan of 10/29/2021. 3. L3-4: Shallow disc protrusion. Facet and ligamentous hypertrophy. Stenosis of both lateral recesses. Findings slightly worsened since 2022. 4. L4-5: Shallow disc protrusion. Facet and ligamentous hypertrophy. Small synovial cyst arising from the facet joint on the right. Stenosis of the lateral recesses right worse than left. Foraminal narrowing right worse than left. Findings have worsened since 2022. 5. L5-S1: Endplate osteophytes and shallow protrusion of the disc. Facet and ligamentous hypertrophy. Stenosis of the subarticular lateral recesses and neural foramina, right worse than left. Similar appearance to the study of 2022. 6. Continued evidence of enteritis of at least 1 loop of small bowel. Distended bladder present.  He reports severe pain in his back.  Start the patient on fentanyl and we gradually uptitrated to 50 mcg every 72 hours.  This has helped him substantially although he continues to have severe pain.  This keeps him from being able to enjoy doing activities around his home.  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  If he has to bend over or move around, the pain becomes severe.  He refuses to take oxycodone for breakthrough.  He is certainly not overmedicated.  He is awake and alert and answering questions appropriately.  His wife states that he is not overmedicated at home and that he is suffering more from back pain and he is indicating today.  At that  time, my plan  was: I will refer the patient back to Dr. Ethelene Hal for possible epidural steroid injection.  Meanwhile I have recommended increasing his fentanyl to 75 mcg daily as the patient refuses to use the oxycodone as needed.  I explained to the patient that my goal is not to remove all of his pain as that would be impossible.  However I want to help alleviate as much of his pain as possible so that he can be more active around his home and enjoy his life more as he battles stage IV lung cancer.  I truly want to improve his quality of life  09/09/22 Reviewed hem/onc note from yesterday:  Neuropathy On Gabapentin 300mg  TID  Fingertip neuropathy intermittent Grade 1, feet neuropathy persistent, Grade 2.  I recommend to further increase gabapentin dosage. He would like to wait and further discuss with pcp   Primary non-small cell carcinoma of upper lobe of left lung (HCC) Stage IV lung adenocarcinoma with brain and bone metastasis.  Labs are reviewed and discussed with patient. Proceed with Keytruda S/p lung radiation to left lung.    Intraparenchymal hemorrhage of brain (HCC) Hold Eliquis.  Follow-up with Dr. Barbaraann Cao Repeat MRI brain-stable findings   Pulmonary embolus [facility]) Previous MRI brain shows persistent microbleeds.   Off Eliquis 2.5mg  BID.    Metastasis to bone (HCC) Recommend Zometa every 4- 6 weeks.-  hold off today due to possible upcoming surgery Continue calcium supplementation.  MRI lumbar with and without contrast showed  Stable remote superior endplate fracture of L2 and large Schmorl  to noted involving L5. 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 PET scan shows stable bone lesions.  L4 fracture uptake likely benign- s/p nerve block on 6/7     Back pain Back pain, MRI lumbar showed compression fracture Continue PRN percocet 7.5mg /325mg , fentanyl patch Q72 hours MRI lumbar showed DJD, L4-L5 synovial  cyst,PET scan showed L4 fracture       Encounter for antineoplastic immunotherapy Immunotherapy plan as listed above   Hypokalemia Due to lasix use.  Recommend potassium daily - he is not compliant  09/09/22 Patient has not been taking his fluid pill.  He has not been taking potassium.  He has +1 swelling in both feet up to his mid shin.  He reports burning stinging pain that is severe in both feet.  He also reports burning and tingling pain in both hands especially in the fingertips.  He is only taking gabapentin once a day.  He also reports urinary urgency, urinary frequency and urge incontinence.  He states that he cannot make it to the bathroom in time. Past Medical History:  Diagnosis Date   Allergy    BPH (benign prostatic hypertrophy)    Cancer (HCC)    Melanoma on Neck    2008   Carotid artery occlusion    CHF (congestive heart failure) (HCC)    Diabetes mellitus    type 2   ED (erectile dysfunction)    GERD (gastroesophageal reflux disease)    Hemorrhagic stroke (HCC) 06/2021   Hyperlipidemia    Hypertension    Lung cancer (HCC)    Neoplasm related pain    Retinopathy due to secondary DM [facility])    Past Surgical History:  Procedure Laterality Date   BRONCHIAL NEEDLE ASPIRATION BIOPSY  06/17/2021   Procedure: BRONCHIAL NEEDLE ASPIRATION BIOPSIES;  Surgeon: Lorin Glass, MD;  Location: [facility] ENDOSCOPY;  Service: Pulmonary;;   IR IMAGING GUIDED PORT INSERTION  08/05/2021   MELANOMA EXCISION  2008   Left side of neck   RADIOLOGY WITH ANESTHESIA N/A 04/05/2020   Procedure: MRI SPINE WITOUT CONTRAST;  Surgeon: Radiologist, Medication, MD;  Location: [facility] OR;  Service: Radiology;  Laterality: N/A;   RADIOLOGY WITH ANESTHESIA N/A 08/22/2021   Procedure: MRI BRAIN WITH AND WITHOUT CONTRAST  WITH ANESTHESIA;  Surgeon: Radiologist, Medication, MD;  Location: [facility] OR;  Service: Radiology;  Laterality: N/A;   RADIOLOGY WITH ANESTHESIA N/A 12/17/2021   Procedure: MRI BRAIN WITH AND  WITHOUT CONTRAST WITH ANESTHESIA; MRI LUMBER WITH AND WITHOUT CONTRAST;  Surgeon: Radiologist, Medication, MD;  Location: [facility] OR;  Service: Radiology;  Laterality: N/A;   RADIOLOGY WITH ANESTHESIA N/A 04/08/2022   Procedure: MRI LUMBER SPINE WITH AND WITHOUT CONTRAST;  Surgeon: Radiologist, Medication, MD;  Location: [facility] OR;  Service: Radiology;  Laterality: N/A;   TONSILLECTOMY     VIDEO BRONCHOSCOPY WITH ENDOBRONCHIAL ULTRASOUND N/A 06/17/2021   Procedure: VIDEO BRONCHOSCOPY WITH ENDOBRONCHIAL ULTRASOUND;  Surgeon: Lorin Glass, MD;  Location[facility] ENDOSCOPY;  Service: Pulmonary;  Laterality: N/A;   Current Outpatient Medications on File Prior to Visit  Medication Sig Dispense Refill   ALPRAZolam (XANAX) 0.5 MG tablet Take 1 tablet (0.5 mg total) by mouth daily as needed for anxiety (prior to radiation.). 30 tablet 0   aspirin EC 81 MG tablet Take 1 tablet (81 mg total) by mouth daily. Swallow whole. 30 tablet 12   atorvastatin (LIPITOR) 40 MG tablet TAKE 1 TABLET(40 MG) BY  MOUTH DAILY 60 tablet 2   Ca Phosphate-Cholecalciferol (CALCIUM WITH D3 PO) Take 1 capsule by mouth daily.     fentaNYL (DURAGESIC) 75 MCG/HR Place 1 patch onto the skin every 3 (three) days. 10 patch 0   folic acid (FOLVITE) 800 MCG tablet Take 1 tablet (800 mcg total) by mouth daily. 60 tablet 1   furosemide (LASIX) 20 MG tablet TAKE 1 TABLET(20 MG) BY MOUTH DAILY AS NEEDED FOR LEG SWELLING OR SHORTNESS OF BREATH 90 tablet 0   gabapentin (NEURONTIN) 300 MG capsule Take 1 capsule (300 mg total) by mouth 3 (three) times daily. 90 capsule 1   gatifloxacin (ZYMAXID) 0.5 % SOLN Place 1 drop into the left eye 4 (four) times daily.     insulin glargine (LANTUS) 100 UNIT/ML injection Inject 30 Units into the skin daily as needed. Only takes Lantus when sugar is above 150.     megestrol (MEGACE) 40 MG tablet Take 1 tablet (40 mg total) by mouth daily. 30 tablet 0   NOVOLOG FLEXPEN 100 UNIT/ML FlexPen Inject 0-10 Units into the skin  daily as needed for high blood sugar (above 200). Sliding scale     nystatin (MYCOSTATIN) 100000 UNIT/ML suspension Take 5 mLs (500,000 Units total) by mouth 4 (four) times daily. Swish and spit 473 mL 2   omeprazole (PRILOSEC) 20 MG capsule Take 20 mg by mouth daily.     oxyCODONE-acetaminophen (PERCOCET) 7.5-325 MG tablet Take 1 tablet by mouth every 4 (four) hours as needed for severe pain. Do not take with cough medication or other pain medication 60 tablet 0   potassium chloride SA (KLOR-CON M) 20 MEQ tablet TAKE 1 TABLET(20 MEQ) BY MOUTH DAILY 90 tablet 0   Probiotic Product (PROBIOTIC DAILY PO) Take by mouth. Physician's Choice Probiotic     tamsulosin (FLOMAX) 0.4 MG CAPS capsule TAKE 1 CAPSULE(0.4 MG) BY MOUTH DAILY 30 capsule 0   traZODone (DESYREL) 50 MG tablet Take 1 tablet (50 mg total) by mouth at bedtime as needed for sleep. 30 tablet 3   UNABLE TO FIND PLEASE CHECK FASTING BLOOD GLUCOSE EVERY MORNING 1 each 0   [DISCONTINUED] prochlorperazine (COMPAZINE) 10 MG tablet Take 1 tablet (10 mg total) by mouth every 6 (six) hours as needed (Nausea or vomiting). 30 tablet 1   Current Facility-Administered Medications on File Prior to Visit  Medication Dose Route Frequency Provider Last Rate Last Admin   heparin lock flush 100 UNIT/ML injection              Allergies  Allergen Reactions   Niaspan [Niacin]     FLUSHING   Social History   Socioeconomic History   Marital status: Married    Spouse name: Not on file   Number of children: Not on file   Years of education: Not on file   Highest education level: Not on file  Occupational History   Not on file  Tobacco Use   Smoking status: Former    Types: Cigarettes    Quit date: 07/21/1990    Years since quitting: 32.1   Smokeless tobacco: Never  Vaping Use   Vaping Use: Never used  Substance and Sexual Activity   Alcohol use: No   Drug use: No   Sexual activity: Not Currently  Other Topics Concern   Not on file  Social  History Narrative   Not on file   Social Determinants of Health   Financial Resource Strain: Low Risk  (04/17/2022)   Overall  Financial Resource Strain (CARDIA)    Difficulty of Paying Living Expenses: Not hard at all  Food Insecurity: No Food Insecurity (04/17/2022)   Hunger Vital Sign    Worried About Running Out of Food in the Last Year: Never true    Ran Out of Food in the Last Year: Never true  Transportation Needs: No Transportation Needs (03/27/2022)   PRAPARE - Administrator, Civil Service (Medical): No    Lack of Transportation (Non-Medical): No  Physical Activity: Inactive (03/27/2022)   Exercise Vital Sign    Days of Exercise per Week: 0 days    Minutes of Exercise per Session: 0 min  Stress: Stress Concern Present (03/27/2022)   [employer] of Occupational Health - Occupational Stress Questionnaire    Feeling of Stress : To some extent  Social Connections: Moderately Isolated (03/27/2022)   Social Connection and Isolation Panel [NHANES]    Frequency of Communication with Friends and Family: More than three times a week    Frequency of Social Gatherings with Friends and Family: Three times a week    Attends Religious Services: Never    Active Member of Clubs or Organizations: No    Attends Banker Meetings: Never    Marital Status: Married  Catering manager Violence: Not At Risk (03/27/2022)   Humiliation, Afraid, Rape, and Kick questionnaire    Fear of Current or Ex-Partner: No    Emotionally Abused: No    Physically Abused: No    Sexually Abused: No    Past Medical History:  Diagnosis Date   Allergy    BPH (benign prostatic hypertrophy)    Cancer (HCC)    Melanoma on Neck    2008   Carotid artery occlusion    CHF (congestive heart failure) (HCC)    Diabetes mellitus    type 2   ED (erectile dysfunction)    GERD (gastroesophageal reflux disease)    Hemorrhagic stroke (HCC) 06/2021   Hyperlipidemia    Hypertension    Lung cancer  (HCC)    Neoplasm related pain    Retinopathy due to secondary DM [facility])    Past Surgical History:  Procedure Laterality Date   BRONCHIAL NEEDLE ASPIRATION BIOPSY  06/17/2021   Procedure: BRONCHIAL NEEDLE ASPIRATION BIOPSIES;  Surgeon: Lorin Glass, MD;  Location[facility] ENDOSCOPY;  Service: Pulmonary;;   IR IMAGING GUIDED PORT INSERTION  08/05/2021   MELANOMA EXCISION  2008   Left side of neck   RADIOLOGY WITH ANESTHESIA N/A 04/05/2020   Procedure: MRI SPINE WITOUT CONTRAST;  Surgeon: Radiologist, Medication, MD;  Location: [facility] OR;  Service: Radiology;  Laterality: N/A;   RADIOLOGY WITH ANESTHESIA N/A 08/22/2021   Procedure: MRI BRAIN WITH AND WITHOUT CONTRAST  WITH ANESTHESIA;  Surgeon: Radiologist, Medication, MD;  Location: [facility] OR;  Service: Radiology;  Laterality: N/A;   RADIOLOGY WITH ANESTHESIA N/A 12/17/2021   Procedure: MRI BRAIN WITH AND WITHOUT CONTRAST WITH ANESTHESIA; MRI LUMBER WITH AND WITHOUT CONTRAST;  Surgeon: Radiologist, Medication, MD;  Location: [facility] OR;  Service: Radiology;  Laterality: N/A;   RADIOLOGY WITH ANESTHESIA N/A 04/08/2022   Procedure: MRI LUMBER SPINE WITH AND WITHOUT CONTRAST;  Surgeon: Radiologist, Medication, MD;  Location: [facility] OR;  Service: Radiology;  Laterality: N/A;   TONSILLECTOMY     VIDEO BRONCHOSCOPY WITH ENDOBRONCHIAL ULTRASOUND N/A 06/17/2021   Procedure: VIDEO BRONCHOSCOPY WITH ENDOBRONCHIAL ULTRASOUND;  Surgeon: Lorin Glass, MD;  Location: [facility] ENDOSCOPY;  Service: Pulmonary;  Laterality: N/A;   Current  Outpatient Medications on File Prior to Visit  Medication Sig Dispense Refill   ALPRAZolam (XANAX) 0.5 MG tablet Take 1 tablet (0.5 mg total) by mouth daily as needed for anxiety (prior to radiation.). 30 tablet 0   aspirin EC 81 MG tablet Take 1 tablet (81 mg total) by mouth daily. Swallow whole. 30 tablet 12   atorvastatin (LIPITOR) 40 MG tablet TAKE 1 TABLET(40 MG) BY MOUTH DAILY 60 tablet 2   Ca Phosphate-Cholecalciferol (CALCIUM WITH D3 PO) Take 1  capsule by mouth daily.     fentaNYL (DURAGESIC) 75 MCG/HR Place 1 patch onto the skin every 3 (three) days. 10 patch 0   folic acid (FOLVITE) 800 MCG tablet Take 1 tablet (800 mcg total) by mouth daily. 60 tablet 1   furosemide (LASIX) 20 MG tablet TAKE 1 TABLET(20 MG) BY MOUTH DAILY AS NEEDED FOR LEG SWELLING OR SHORTNESS OF BREATH 90 tablet 0   gabapentin (NEURONTIN) 300 MG capsule Take 1 capsule (300 mg total) by mouth 3 (three) times daily. 90 capsule 1   gatifloxacin (ZYMAXID) 0.5 % SOLN Place 1 drop into the left eye 4 (four) times daily.     insulin glargine (LANTUS) 100 UNIT/ML injection Inject 30 Units into the skin daily as needed. Only takes Lantus when sugar is above 150.     megestrol (MEGACE) 40 MG tablet Take 1 tablet (40 mg total) by mouth daily. 30 tablet 0   NOVOLOG FLEXPEN 100 UNIT/ML FlexPen Inject 0-10 Units into the skin daily as needed for high blood sugar (above 200). Sliding scale     nystatin (MYCOSTATIN) 100000 UNIT/ML suspension Take 5 mLs (500,000 Units total) by mouth 4 (four) times daily. Swish and spit 473 mL 2   omeprazole (PRILOSEC) 20 MG capsule Take 20 mg by mouth daily.     oxyCODONE-acetaminophen (PERCOCET) 7.5-325 MG tablet Take 1 tablet by mouth every 4 (four) hours as needed for severe pain. Do not take with cough medication or other pain medication 60 tablet 0   potassium chloride SA (KLOR-CON M) 20 MEQ tablet TAKE 1 TABLET(20 MEQ) BY MOUTH DAILY 90 tablet 0   Probiotic Product (PROBIOTIC DAILY PO) Take by mouth. Physician's Choice Probiotic     tamsulosin (FLOMAX) 0.4 MG CAPS capsule TAKE 1 CAPSULE(0.4 MG) BY MOUTH DAILY 30 capsule 0   traZODone (DESYREL) 50 MG tablet Take 1 tablet (50 mg total) by mouth at bedtime as needed for sleep. 30 tablet 3   UNABLE TO FIND PLEASE CHECK FASTING BLOOD GLUCOSE EVERY MORNING 1 each 0   [DISCONTINUED] prochlorperazine (COMPAZINE) 10 MG tablet Take 1 tablet (10 mg total) by mouth every 6 (six) hours as needed (Nausea or  vomiting). 30 tablet 1   Current Facility-Administered Medications on File Prior to Visit  Medication Dose Route Frequency Provider Last Rate Last Admin   heparin lock flush 100 UNIT/ML injection            Allergies  Allergen Reactions   Niaspan [Niacin]     FLUSHING   Social History   Socioeconomic History   Marital status: Married    Spouse name: Not on file   Number of children: Not on file   Years of education: Not on file   Highest education level: Not on file  Occupational History   Not on file  Tobacco Use   Smoking status: Former    Types: Cigarettes    Quit date: 07/21/1990    Years since quitting: 32.1  Smokeless tobacco: Never  Vaping Use   Vaping Use: Never used  Substance and Sexual Activity   Alcohol use: No   Drug use: No   Sexual activity: Not Currently  Other Topics Concern   Not on file  Social History Narrative   Not on file   Social Determinants of Health   Financial Resource Strain: Low Risk  (04/17/2022)   Overall Financial Resource Strain (CARDIA)    Difficulty of Paying Living Expenses: Not hard at all  Food Insecurity: No Food Insecurity (04/17/2022)   Hunger Vital Sign    Worried About Running Out of Food in the Last Year: Never true    Ran Out of Food in the Last Year: Never true  Transportation Needs: No Transportation Needs (03/27/2022)   PRAPARE - Administrator, Civil Service (Medical): No    Lack of Transportation (Non-Medical): No  Physical Activity: Inactive (03/27/2022)   Exercise Vital Sign    Days of Exercise per Week: 0 days    Minutes of Exercise per Session: 0 min  Stress: Stress Concern Present (03/27/2022)   [employer] of Occupational Health - Occupational Stress Questionnaire    Feeling of Stress : To some extent  Social Connections: Moderately Isolated (03/27/2022)   Social Connection and Isolation Panel [NHANES]    Frequency of Communication with Friends and Family: More than three times a week     Frequency of Social Gatherings with Friends and Family: Three times a week    Attends Religious Services: Never    Active Member of Clubs or Organizations: No    Attends Banker Meetings: Never    Marital Status: Married  Catering manager Violence: Not At Risk (03/27/2022)   Humiliation, Afraid, Rape, and Kick questionnaire    Fear of Current or Ex-Partner: No    Emotionally Abused: No    Physically Abused: No    Sexually Abused: No      Review of Systems  Musculoskeletal:  Positive for back pain.  All other systems reviewed and are negative.      Objective:   Physical Exam Vitals reviewed.  Constitutional:      General: He is not in acute distress.    Appearance: He is not ill-appearing.  HENT:     Mouth/Throat:     Mouth: Mucous membranes are moist.  Cardiovascular:     Rate and Rhythm: Normal rate and regular rhythm.     Heart sounds: Normal heart sounds. No murmur heard.    No friction rub. No gallop.  Pulmonary:     Effort: Pulmonary effort is normal. No respiratory distress.     Breath sounds: Normal breath sounds. No stridor. No wheezing, rhonchi or rales.  Musculoskeletal:       Back:  Neurological:     General: No focal deficit present.     Mental Status: He is alert and oriented to person, place, and time.     Cranial Nerves: No cranial nerve deficit.     Sensory: No sensory deficit.     Deep Tendon Reflexes: Reflexes normal.           Assessment & Plan:  Neuropathy  OAB (overactive bladder) Increase gabapentin to 300 mg 3 times a day.  Recommended taking potassium chloride 10 mEq, 2 tablets daily for hypokalemia.  Start Vesicare 10 mg daily for overactive bladder.  Reassess in 1 week or sooner if worse

## 2022-09-10 ENCOUNTER — Other Ambulatory Visit: Payer: Self-pay | Admitting: Oncology

## 2022-09-11 ENCOUNTER — Encounter: Payer: Self-pay | Admitting: Oncology

## 2022-09-16 DIAGNOSIS — C3490 Malignant neoplasm of unspecified part of unspecified bronchus or lung: Secondary | ICD-10-CM | POA: Diagnosis not present

## 2022-09-16 DIAGNOSIS — M47816 Spondylosis without myelopathy or radiculopathy, lumbar region: Secondary | ICD-10-CM | POA: Diagnosis not present

## 2022-09-16 DIAGNOSIS — C7951 Secondary malignant neoplasm of bone: Secondary | ICD-10-CM | POA: Diagnosis not present

## 2022-09-18 ENCOUNTER — Inpatient Hospital Stay: Payer: PPO

## 2022-09-18 ENCOUNTER — Other Ambulatory Visit: Payer: Self-pay | Admitting: *Deleted

## 2022-09-18 ENCOUNTER — Ambulatory Visit
Admission: RE | Admit: 2022-09-18 | Discharge: 2022-09-18 | Disposition: A | Discharge status: Home / Self Care | Payer: PPO | Source: Ambulatory Visit | Attending: Radiation Oncology | Admitting: Radiation Oncology

## 2022-09-18 ENCOUNTER — Encounter: Payer: Self-pay | Admitting: Radiation Oncology

## 2022-09-18 VITALS — BP 102/57 | HR 76 | Temp 99.0°F | Resp 16 | Wt 133.7 lb

## 2022-09-18 DIAGNOSIS — C3412 Malignant neoplasm of upper lobe, left bronchus or lung: Secondary | ICD-10-CM

## 2022-09-18 NOTE — Progress Notes (Signed)
Nutrition Follow-up:  Referred back to RD  Patient with non small cell lung cancer, stage IV with brain and bone metastasis.  Patient receiving [place].   Met with patient and wife following radiation follow-up appointment.  Patient reports that appetite is a little bit better after starting megace.  Usually eats 1 meal a day.  Often eats a pack of nabs in the middle of the night or vanilla wafers then may not eat again until supper.  Sometimes eats lunch (Hamburger and few fries).  Supper meat and couple of sides.  Does not like oral nutrition supplement.  Eats a popscile/ice cream every night.  Sometimes food has salty taste.  Has a had time swallowing large pills.       Medications: megace, calcium and Vit D, lantus, folic acid, lasix, K CL  Labs: glucose 201, K 3.0, Na 132, calcium 8.8  Anthropometrics:   133 lb 11.2 oz today 150 lb 11.2 oz on 09/27/21   NUTRITION DIAGNOSIS: Inadequate oral intake improving    INTERVENTION:  Discussed ways to add calories and protein in diet.  Handout on High Calorie, High Protein diet provided to wife with snack list. Likes to snack on nuts Wife wanting to try protein powder.  Samples of unjury provided Contact information provided     MONITORING, EVALUATION, GOAL: weight trends, intake   NEXT VISIT: phone Thursday, August 8  Wyvonne Carda B. Freida Busman, RD, LDN Registered Dietitian [phone]

## 2022-09-18 NOTE — Progress Notes (Signed)
Radiation Oncology Follow up Note  Name: Micheal Donovan   Date:   09/18/2022 MRN:  [id number] DOB: Jun 18, 1950    This 72 y.o. male presents to the clinic today for 1 month follow-up status post SBRT to his left lung.  In patient with known stage IV adenocarcinoma lung  REFERRING PROVIDER: Donita Brooks, MD  HPI: Patient is a 72 year old male now out 1 month having completed SBRT to his left lung and patient with known stage IV adenocarcinoma of the lung.  Seen today in routine follow-up he is without complaint specifically denies any cough hemoptysis chest tightness or any change in his breathing pattern.  He does have back pain has been seen by neurosurgeon has been declined at this time for surgery..  COMPLICATIONS OF TREATMENT: none  FOLLOW UP COMPLIANCE: keeps appointments   PHYSICAL EXAM:  BP (!) 102/57   Pulse 76   Temp 99 F (37.2 C) (Tympanic)   Resp 16   Wt 133 lb 11.2 oz (60.6 kg)   BMI 20.94 kg/m  Well-developed well-nourished patient in NAD. HEENT reveals PERLA, EOMI, discs not visualized.  Oral cavity is clear. No oral mucosal lesions are identified. Neck is clear without evidence of cervical or supraclavicular adenopathy. Lungs are clear to A&P. Cardiac examination is essentially unremarkable with regular rate and rhythm without murmur rub or thrill. Abdomen is benign with no organomegaly or masses noted. Motor sensory and DTR levels are equal and symmetric in the upper and lower extremities. Cranial nerves II through XII are grossly intact. Proprioception is intact. No peripheral adenopathy or edema is identified. No motor or sensory levels are noted. Crude visual fields are within normal range.  RADIOLOGY RESULTS: CT scan of the chest ordered  PLAN: At this time of asked to see him back in 3 months for follow-up with a repeat CT scan of his chest.  Patient continues close monitoring and care by medical oncology.  Patient is to call with any concerns.  I would like to  take this opportunity to thank you for allowing me to participate in the care of your patient.[name], MD

## 2022-09-22 ENCOUNTER — Other Ambulatory Visit: Payer: Self-pay | Admitting: Oncology

## 2022-09-22 ENCOUNTER — Other Ambulatory Visit: Payer: Self-pay | Admitting: Family Medicine

## 2022-09-22 DIAGNOSIS — C7800 Secondary malignant neoplasm of unspecified lung: Secondary | ICD-10-CM | POA: Diagnosis not present

## 2022-09-22 DIAGNOSIS — M549 Dorsalgia, unspecified: Secondary | ICD-10-CM | POA: Diagnosis not present

## 2022-09-22 DIAGNOSIS — M5416 Radiculopathy, lumbar region: Secondary | ICD-10-CM | POA: Diagnosis not present

## 2022-09-22 MED ORDER — MEGESTROL ACETATE 40 MG PO TABS: 40.0000 mg | ORAL_TABLET | Freq: Every day | ORAL | 3 refills | Status: DC

## 2022-09-22 MED ORDER — OXYCODONE-ACETAMINOPHEN 7.5-325 MG PO TABS: 1.0000 | ORAL_TABLET | ORAL | 0 refills | Status: DC | PRN

## 2022-09-25 ENCOUNTER — Encounter: Payer: Self-pay | Admitting: Oncology

## 2022-09-25 DIAGNOSIS — M5416 Radiculopathy, lumbar region: Secondary | ICD-10-CM | POA: Diagnosis not present

## 2022-09-29 ENCOUNTER — Ambulatory Visit: Payer: PPO | Admitting: Oncology

## 2022-09-29 ENCOUNTER — Other Ambulatory Visit: Payer: PPO

## 2022-09-29 ENCOUNTER — Inpatient Hospital Stay: Payer: PPO

## 2022-09-29 ENCOUNTER — Ambulatory Visit: Payer: PPO

## 2022-09-29 ENCOUNTER — Inpatient Hospital Stay: Payer: PPO | Admitting: Oncology

## 2022-09-29 ENCOUNTER — Encounter: Payer: Self-pay | Admitting: Oncology

## 2022-09-29 VITALS — BP 134/61 | HR 66 | Temp 97.4°F | Resp 18 | Wt 132.5 lb

## 2022-09-29 DIAGNOSIS — E876 Hypokalemia: Secondary | ICD-10-CM

## 2022-09-29 DIAGNOSIS — C7951 Secondary malignant neoplasm of bone: Secondary | ICD-10-CM

## 2022-09-29 DIAGNOSIS — Z5112 Encounter for antineoplastic immunotherapy: Secondary | ICD-10-CM | POA: Diagnosis not present

## 2022-09-29 DIAGNOSIS — C3412 Malignant neoplasm of upper lobe, left bronchus or lung: Secondary | ICD-10-CM

## 2022-09-29 DIAGNOSIS — G8929 Other chronic pain: Secondary | ICD-10-CM

## 2022-09-29 DIAGNOSIS — M549 Dorsalgia, unspecified: Secondary | ICD-10-CM

## 2022-09-29 DIAGNOSIS — I2699 Other pulmonary embolism without acute cor pulmonale: Secondary | ICD-10-CM

## 2022-09-29 DIAGNOSIS — G629 Polyneuropathy, unspecified: Secondary | ICD-10-CM

## 2022-09-29 DIAGNOSIS — I619 Nontraumatic intracerebral hemorrhage, unspecified: Secondary | ICD-10-CM

## 2022-09-29 LAB — CBC WITH DIFFERENTIAL/PLATELET
Abs Immature Granulocytes: 0.04 10*3/uL (ref 0.00–0.07)
Basophils Absolute: 0.1 10*3/uL (ref 0.0–0.1)
Basophils Relative: 1 %
Eosinophils Absolute: 0.4 10*3/uL (ref 0.0–0.5)
Eosinophils Relative: 6 %
HCT: 28 % — ABNORMAL LOW (ref 39.0–52.0)
Hemoglobin: 9 g/dL — ABNORMAL LOW (ref 13.0–17.0)
Immature Granulocytes: 1 %
Lymphocytes Relative: 11 %
Lymphs Abs: 0.7 10*3/uL (ref 0.7–4.0)
MCH: 28.8 pg (ref 26.0–34.0)
MCHC: 32.1 g/dL (ref 30.0–36.0)
MCV: 89.5 fL (ref 80.0–100.0)
Monocytes Absolute: 0.5 10*3/uL (ref 0.1–1.0)
Monocytes Relative: 7 %
Neutro Abs: 5 10*3/uL (ref 1.7–7.7)
Neutrophils Relative %: 74 %
Platelets: 383 10*3/uL (ref 150–400)
RBC: 3.13 MIL/uL — ABNORMAL LOW (ref 4.22–5.81)
RDW: 13.8 % (ref 11.5–15.5)
WBC: 6.6 10*3/uL (ref 4.0–10.5)
nRBC: 0 % (ref 0.0–0.2)

## 2022-09-29 LAB — COMPREHENSIVE METABOLIC PANEL
ALT: 13 U/L (ref 0–44)
AST: 15 U/L (ref 15–41)
Albumin: 3.2 g/dL — ABNORMAL LOW (ref 3.5–5.0)
Alkaline Phosphatase: 69 U/L (ref 38–126)
Anion gap: 8 (ref 5–15)
BUN: 24 mg/dL — ABNORMAL HIGH (ref 8–23)
CO2: 21 mmol/L — ABNORMAL LOW (ref 22–32)
Calcium: 8.8 mg/dL — ABNORMAL LOW (ref 8.9–10.3)
Chloride: 108 mmol/L (ref 98–111)
Creatinine, Ser: 1.09 mg/dL (ref 0.61–1.24)
GFR, Estimated: >60 mL/min (ref >60)
Glucose, Bld: 117 mg/dL — ABNORMAL HIGH (ref 70–99)
Potassium: 4 mmol/L (ref 3.5–5.1)
Sodium: 137 mmol/L (ref 135–145)
Total Bilirubin: 0.3 mg/dL (ref 0.3–1.2)
Total Protein: 7.1 g/dL (ref 6.5–8.1)

## 2022-09-29 LAB — TSH: TSH: 1.955 u[IU]/mL (ref 0.350–4.500)

## 2022-09-29 MED ORDER — FENTANYL 75 MCG/HR TD PT72: 1.0000 | MEDICATED_PATCH | TRANSDERMAL | 0 refills | Status: DC

## 2022-09-29 MED ORDER — HEPARIN SOD (PORK) LOCK FLUSH 100 UNIT/ML IV SOLN
500.0000 [IU] | Freq: Once | INTRAVENOUS | Status: AC | PRN
  Administered 2022-09-29: 500 [IU]
  Filled 2022-09-29: qty 5

## 2022-09-29 MED ORDER — SODIUM CHLORIDE 0.9 % IV SOLN
200.0000 mg | Freq: Once | INTRAVENOUS | Status: AC
  Administered 2022-09-29: 200 mg via INTRAVENOUS
  Filled 2022-09-29: qty 200

## 2022-09-29 MED ORDER — ZOLEDRONIC ACID 4 MG/100ML IV SOLN
4.0000 mg | Freq: Once | INTRAVENOUS | Status: AC
  Administered 2022-09-29: 4 mg via INTRAVENOUS
  Filled 2022-09-29: qty 100

## 2022-09-29 MED ORDER — SODIUM CHLORIDE 0.9 % IV SOLN
Freq: Once | INTRAVENOUS | Status: AC
  Filled 2022-09-29: qty 250

## 2022-09-29 NOTE — Assessment & Plan Note (Signed)
Due to lasix use.  Improved. Recommend him to continue potassium daily -

## 2022-09-29 NOTE — Assessment & Plan Note (Signed)
Back pain, MRI lumbar showed compression fracture Continue PRN percocet 7.5mg /325mg , fentanyl patch Q72 hours - his pain is well controlled, he does not utilize percocet  until pain is really bad, I recommend him to stay on same fentanyl dose, but to utilize percocet Q4-6 hours as needed.  MRI lumbar showed DJD, L4-L5 synovial cyst,PET scan showed L4 fracture

## 2022-09-29 NOTE — Assessment & Plan Note (Signed)
Hold Eliquis.  Follow-up with Dr. Vaslow Repeat MRI brain-stable findings 

## 2022-09-29 NOTE — Assessment & Plan Note (Signed)
On Gabapentin 300mg  TID  Fingertip neuropathy intermittent Grade 1, feet neuropathy persistent, Grade 2.

## 2022-09-29 NOTE — Patient Instructions (Signed)
[facility]  Discharge Instructions: Thank you for choosing [facility] to provide your oncology and hematology care.  If you have a lab appointment with the Cancer Center, please go directly to the Cancer Center and check in at the registration area.  Wear comfortable clothing and clothing appropriate for easy access to any Portacath or PICC line.   We strive to give you quality time with your provider. You may need to reschedule your appointment if you arrive late (15 or more minutes).  Arriving late affects you and other patients whose appointments are after yours.  Also, if you miss three or more appointments without notifying the office, you may be dismissed from the clinic at the provider's discretion.      For prescription refill requests, have your pharmacy contact our office and allow 72 hours for refills to be completed.    Today you received the following chemotherapy and/or immunotherapy agents Keytruda and Zometa.      To help prevent nausea and vomiting after your treatment, we encourage you to take your nausea medication as directed.  BELOW ARE SYMPTOMS THAT SHOULD BE REPORTED IMMEDIATELY: *FEVER GREATER THAN 100.4 F (38 C) OR HIGHER *CHILLS OR SWEATING *NAUSEA AND VOMITING THAT IS NOT CONTROLLED WITH YOUR NAUSEA MEDICATION *UNUSUAL SHORTNESS OF BREATH *UNUSUAL BRUISING OR BLEEDING *URINARY PROBLEMS (pain or burning when urinating, or frequent urination) *BOWEL PROBLEMS (unusual diarrhea, constipation, pain near the anus) TENDERNESS IN MOUTH AND THROAT WITH OR WITHOUT PRESENCE OF ULCERS (sore throat, sores in mouth, or a toothache) UNUSUAL RASH, SWELLING OR PAIN  UNUSUAL VAGINAL DISCHARGE OR ITCHING   Items with * indicate a potential emergency and should be followed up as soon as possible or go to the Emergency Department if any problems should occur.  Please show the CHEMOTHERAPY ALERT CARD or IMMUNOTHERAPY ALERT CARD at  check-in to the Emergency Department and triage nurse.  Should you have questions after your visit or need to cancel or reschedule your appointment, please contact [facility]  [phone] and follow the prompts.  Office hours are 8:00 a.m. to 4:30 p.m. Monday - Friday. Please note that voicemails left after 4:00 p.m. may not be returned until the following business day.  We are closed weekends and major holidays. You have access to a nurse at all times for urgent questions. Please call the main number to the clinic [phone] and follow the prompts.  For any non-urgent questions, you may also contact your provider using MyChart. We now offer e-Visits for anyone 67 and older to request care online for non-urgent symptoms. For details visit [email or web address].   Also download the MyChart app! Go to the app store, search "MyChart", open the app, select [facility] [place], and log in with your MyChart username and password.

## 2022-09-29 NOTE — Assessment & Plan Note (Addendum)
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  Labs are reviewed and discussed with patient. Proceed with Keytruda S/p lung radiation to left lung.  Repeat CT scan

## 2022-09-29 NOTE — Progress Notes (Signed)
Pt here for follow up. Gabapentin was increased by PCP but he has not been taking like he should. Pt was also seen by "back doctor", who pt states suggested pain medication should be increased.

## 2022-09-29 NOTE — Assessment & Plan Note (Signed)
Previous MRI brain shows persistent microbleeds.   Off Eliquis 2.5mg BID.  

## 2022-09-29 NOTE — Assessment & Plan Note (Signed)
Immunotherapy plan as listed above 

## 2022-09-29 NOTE — Assessment & Plan Note (Signed)
Recommend Zometa every 4- 6 weeks.-  hold off today due to possible upcoming surgery Continue calcium supplementation.  MRI lumbar with and without contrast showed  Stable remote superior endplate fracture of L2 and large Schmorl to noted involving L5. 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 PET scan shows stable bone lesions.  L4 fracture uptake likely benign- s/p nerve block

## 2022-09-29 NOTE — Progress Notes (Signed)
Hematology/Oncology Progress note [phone] [phone]       CHIEF COMPLAINTS/REASON FOR VISIT:  Metastatic non-small cell lung cancer  ASSESSMENT & PLAN:   Cancer Staging  Primary non-small cell carcinoma of upper lobe of left lung (HCC) Staging form: Lung, AJCC 8th Edition - Clinical: Stage IV (cT2b, cN1, cM1) - Signed by [name], MD on 08/06/2021   Primary non-small cell carcinoma of upper lobe of left lung (HCC) Stage IV lung adenocarcinoma with brain and bone metastasis.  Labs are reviewed and discussed with patient. Proceed with Keytruda S/p lung radiation to left lung.  Repeat CT scan  Intraparenchymal hemorrhage of brain (HCC) Hold Eliquis.  Follow-up with Dr. Barbaraann Cao Repeat MRI brain-stable findings  Pulmonary embolus [facility]) Previous MRI brain shows persistent microbleeds.   Off Eliquis 2.5mg  BID.   Metastasis to bone (HCC) Recommend Zometa every 4- 6 weeks.-  hold off today due to possible upcoming surgery Continue calcium supplementation.  MRI lumbar with and without contrast showed  Stable remote superior endplate fracture of L2 and large Schmorl to noted involving L5. 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 PET scan shows stable bone lesions.  L4 fracture uptake likely benign- s/p nerve block    Back pain Back pain, MRI lumbar showed compression fracture Continue PRN percocet 7.5mg /325mg , fentanyl patch Q72 hours - his pain is well controlled, he does not utilize percocet  until pain is really bad, I recommend him to stay on same fentanyl dose, but to utilize percocet Q4-6 hours as needed.  MRI lumbar showed DJD, L4-L5 synovial cyst,PET scan showed L4 fracture    Encounter for antineoplastic immunotherapy Immunotherapy plan as listed above  Hypokalemia Due to lasix use.  Improved. Recommend him to continue potassium daily -  Neuropathy On  Gabapentin 300mg  TID  Fingertip neuropathy intermittent Grade 1, feet neuropathy persistent, Grade 2.    Follow up 3 weeks lab MD Keytruda. All questions were answered. The patient knows to call the clinic with any problems, questions or concerns.  [name], MD, [name] [facility] Hematology Oncology 09/29/2022      HISTORY OF PRESENTING ILLNESS:   Micheal Donovan is a  72 y.o.  male presents for follow up of Non-small cell lung cancer.  Oncology History Overview Note  Diagnosis: Stage IIB T2b N1 M0 adenocarcinoma of the LUL, poorly differentiated     Primary non-small cell carcinoma of upper lobe of left lung (HCC)  06/13/2021 Initial Diagnosis   Primary non-small cell carcinoma of upper lobe of left lung [facility]) -06/20/2021 - 06/27/2021, patient presented to [facility] due to progressive headache/dizziness/gait changes. CT head showed acute to subacute intraparenchymal hemorrhage involving the left cerebellum.  Surrounding low-density vasogenic edema.  Patient was transferred to [facility]. This was further evaluated by CT angiogram of the neck which showed bulky calcified plaques/stenosis of carotid artery, Followed by MRI brain. 06/12/2021, MRI of the brain showed no significant interval change in size of the left cerebellar intraparenchymal hematoma with unchanged regional mass effect and partial effacement of fourth ventricle but no upstream hydrocephalus. There is no discernible enhancement to suggest underlying mass lesion, though acute blood products could mask enhancement.   06/12/2021 a chest x-ray showed a 4.4 cm left middle lobe. 06/13/2021, CT chest with contrast showed a 4.3 x 3.6 x 3.3 cm lobular spiculated mass in the posterior left upper lobe with T3 to the lateral pleura and  major fissure.  Metastatic left hilar lymphadenopathy.  Peripheral micronodularity posterior right costophrenic sulcus.  Aortic atherosclerosis. 06/14/2021, CT abdomen pelvis showed right lower lobe  pulmonary artery embolus.  No evidence of right heart strain.  No acute intra-abdominal or pelvic pathology.  Aortic atherosclerosis.  06/13/2021, patient underwent bronchoscopy with EBUS by Dr. Katrinka Blazing.  Biopsy from the fine-needle aspiration of station 11 mL showed malignant cells, consistent with poorly differentiated non-small cell carcinoma, consistent with adenocarcinoma.  Malignant cells are TTF-1 positive and negative for p40.  Negative for neuroendocrine markers.  Blood test and tissue sample were tested for Gardant 360  -PD-L1 TPS 97%, no actionable mutation on the blood testing. 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   07/17/2021 Cancer Staging   Staging form: Lung, AJCC 8th Edition - Clinical: Stage IV (cT2b, cN1, cM1) - Signed by [name], MD on 08/06/2021   08/06/2021 Imaging   I saw patient's PET scan after his visit with me on 08/06/21. PET scan was ordered by his previous oncologist group and did not come to my in basket.. Patient received cycle 1 carboplatin Taxol today.  He has started on radiation.  5/26/3 PET scan showed 3.8 cm hypermetabolic left upper lobe mass with hypermetabolic left hilar and infrahilar adenopathy.  There is approximately 8 scattered metastatic lesions in the skeleton. Mixed density photopenic lesion in the left cerebellum. characterized as hemorrhage on recent prior imaging workups.    08/16/2021 -  Chemotherapy   Patient is on Treatment Plan : LUNG Carboplatin (5) + Pemetrexed + Pembrolizumab (200) D1 q21d Induction x 4 cycles / Maintenance Pemetrexed + Pembrolizumab (200) D1 q21d      08/22/2021 Imaging   MRI brain w wo contrast  Decrease in size of left cerebellar hematoma with resolution of edema. No evidence of underlying lesion. Smaller, more recent hemorrhage in the left cerebellar vermis with minimal edema. There is minimal enhancement without definite evidence of underlying lesion.  New punctate focus of chronic blood products and  enhancement in the left frontal lobe. Additional new foci of chronic blood products in the posterior right putamen, right parietal subcortical white matter, and posterior right cerebellum. Unclear at this time if these represent foci of bland hemorrhage or early metastases.   Increase in size of right parietal osseous metastasis with minor extraosseous extension.     Imaging   PET scan showed 1. Interval response to therapy as evidenced by a small residual left upper lobe nodule with decreased hypermetabolism, no residual hypermetabolic adenopathy and decreased hypermetabolism associatedwith osseous metastases. 2. 6 mm posterior left upper lobe nodule, likely stable. Recommend attention on follow-up. 3. Aortic atherosclerosis (ICD10-I70.0). Coronary artery calcification.   12/17/2021 Imaging   MRI lumbar spine w wo contrast  1. No evidence of regional metastatic disease. 2. Late subacute superior endplate fracture at L2 and large superior endplate Schmorl's node at L5, unchanged since the PET scan of 10/29/2021. 3. L3-4: Shallow disc protrusion. Facet and ligamentous hypertrophy. Stenosis of both lateral recesses. Findings slightly worsened since 2022. 4. L4-5: Shallow disc protrusion. Facet and ligamentous hypertrophy. Small synovial cyst arising from the facet joint on the right. Stenosis of the lateral recesses right worse than left. Foraminal narrowing right worse than left. Findings have worsened since 2022.  5. L5-S1: Endplate osteophytes and shallow protrusion of the disc. Facet and ligamentous hypertrophy. Stenosis of the subarticular lateral recesses and neural foramina, right worse than left. Similar appearance to the study of 2022. 6. Continued evidence of  enteritis of at least 1 loop of small bowel. Distended bladder present   12/18/2021 Imaging   Brian MRI w wo  1. Interval decrease in size of the left cerebellar and vermian hematomas. No definite evidence of underlying metastatic  lesion. 2. There is are two possible contrast enhancing lesions in the posterior left frontal lobe and left occipital lobe, which do not have intrinsic T1 signal abnormality, but have a somewhat linear appearance and may be vascular in nature. Recommend attention on follow up. 3. Multifocal sites of susceptibility artifact are redemonstrated with interval development of a few new foci, as described above. All of these sites demonstrate intrinsic T1 hyperintense signal abnormality and susceptibility artifact and are compatible with sites of microhemorrhages. Recommend continued attention on follow up. 4. Interval decrease in size of right parietal calvarial metastatic lesion. No new contrast enhancing lesions visualized. 5. Diffusely heterogeneous marrow signal throughout the cervical spine, which is nonspecific but can be seen in the setting of anemia, smoking, obesity, or a marrow replacement process.     12/18/2021 Imaging   MRI lumbar spine w wo contrast  1. No evidence of regional metastatic disease. 2. Late subacute superior endplate fracture at L2 and large superior endplate Schmorl's node at L5, unchanged since the PET scan of 10/29/2021 3. L3-4: Shallow disc protrusion. Facet and ligamentous hypertrophy.Stenosis of both lateral recesses. Findings slightly worsened since 2022. 4. L4-5: Shallow disc protrusion. Facet and ligamentous hypertrophy. Small synovial cyst arising from the facet joint on the right. Stenosis of the lateral recesses right worse than left. Foraminal narrowing right worse than left. Findings have worsened since 2022. 5. L5-S1: Endplate osteophytes and shallow protrusion of the disc. Facet and ligamentous hypertrophy. Stenosis of the subarticular lateral recesses and neural foramina, right worse than left. Similar appearance to the study of 2022. 6. Continued evidence of enteritis of at least 1 loop of small bowel. Distended bladder present.       01/28/2022 Imaging    CT chest abdomen pelvis w contrast 1. Treated left upper lobe mass appears grossly stable to the prior examination when measured in a similar fashion on the prior study. No definite signs of extra skeletal metastatic disease noted in the chest, abdomen or pelvis. 2. Widespread skeletal metastases redemonstrated, as above. 3. Hepatic steatosis. 4. Aortic atherosclerosis, in addition to left main and three-vessel coronary artery disease. Assessment for potential risk factor modification, dietary therapy or pharmacologic therapy may be warranted, if clinically indicated. 5. Additional incidental findings,    04/08/2022 Imaging   MRI lumbar spine w wo contrast  1. Stable remote superior endplate fracture of L2 and large Schmorl to noted involving L5. No worrisome bone lesions to suggest metastatic disease. 2. Degenerative lumbar spondylosis with multilevel disc disease and facet disease which appears relatively stable as detailed above. 3. Enlarging right-sided synovial cyst at L4-5 with progressive mass effect on the right side of the thecal sac. This could potentially be symptomatic   04/28/2022 Imaging   CT chest abdomen pelvis wo contrast  1. Similar versus slightly decreased size of treated left upper lobe primary bronchogenic carcinoma. 2. Similar osseous metastasis. 3. No evidence of extraosseous metastatic disease. 4. New small right pleural effusion. 5. Coronary artery atherosclerosis. Aortic Atherosclerosis   07/03/2022 Imaging   PET scan showed 1. Interval development of a small focus of intense hypermetabolism within the left upper lobe scar. This is associated with a 5 mm perifissural nodule in the left lung which is hypermetabolic. Hypermetabolism  at both of these locations is new since prior PET-CT and highly suspicious for recurrent disease. 2. No evidence for hypermetabolic soft tissue metastatic disease in the neck, abdomen, or pelvis. 3. Similar appearance of sclerotic and  lytic bone lesions without hypermetabolism on PET imaging. 4. Focal hypermetabolism associated with a fracture of the right L4 transverse process. The fracture was visible on the previous CT of 04/28/2022. No associated soft tissue lesion to suggest that this is pathologic in nature. Tracer accumulation on today's study is in keeping with healing fracture. There is also some minimal uptake in a prominent spur associated with the L4 superior endplate, most likely degenerative. 5.  Aortic Atherosclerosis    08/01/2022 - 08/14/2022 Radiation Therapy   Radiation to left lung.    # Patient has a history of melanoma on his neck, treated in 2009. # History of hemorrhagic infarct left cerebellum  INTERVAL HISTORY Micheal Donovan is a 72 y.o. male who has above history reviewed by me today presents for follow up visit for management of  Stage IV lung adenocarcianoma # Pulmonary embolism, SOB is stable, not worse. Off Eliquis due to MRI findings.  + back pain, and left lower extremity weakness due to pain, on fentanyl patch Q72 hours and percocet, he sees orthopedic surgeon for epidural injections, He reports pain is 3/10, he took a dose of percocet this morning. On Average he took percocet once a daily. He tries not to take pain medication until pain is really bad.  Accompanied by wife.  No fever or chills.     Review of Systems  Constitutional:  Positive for appetite change, fatigue and unexpected weight change. Negative for chills and fever.  HENT:   Negative for hearing loss and voice change.   Eyes:  Negative for eye problems and icterus.  Respiratory:  Positive for shortness of breath. Negative for chest tightness and cough.   Cardiovascular:  Positive for leg swelling. Negative for chest pain.  Gastrointestinal:  Negative for abdominal distention, abdominal pain and blood in stool.  Endocrine: Negative for hot flashes.  Genitourinary:  Negative for difficulty urinating, dysuria and  frequency.   Musculoskeletal:  Positive for back pain. Negative for arthralgias.  Skin:  Negative for itching and rash.  Neurological:  Negative for extremity weakness, headaches, light-headedness and numbness.  Hematological:  Negative for adenopathy. Does not bruise/bleed easily.  Psychiatric/Behavioral:  Positive for sleep disturbance. Negative for confusion.     MEDICAL HISTORY:  Past Medical History:  Diagnosis Date   Allergy    BPH (benign prostatic hypertrophy)    Cancer (HCC)    Melanoma on Neck    2008   Carotid artery occlusion    CHF (congestive heart failure) (HCC)    Diabetes mellitus    type 2   ED (erectile dysfunction)    GERD (gastroesophageal reflux disease)    Hemorrhagic stroke (HCC) 06/2021   Hyperlipidemia    Hypertension    Lung cancer (HCC)    Neoplasm related pain    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)     SURGICAL HISTORY: Past Surgical History:  Procedure Laterality Date   BRONCHIAL NEEDLE ASPIRATION BIOPSY  06/17/2021   Procedure: BRONCHIAL NEEDLE ASPIRATION BIOPSIES;  Surgeon: Lorin Glass, MD;  Location: [facility] ENDOSCOPY;  Service: Pulmonary;;   IR IMAGING GUIDED PORT INSERTION  08/05/2021   MELANOMA EXCISION  2008   Left side of neck   RADIOLOGY WITH ANESTHESIA N/A 04/05/2020   Procedure: MRI  SPINE WITOUT CONTRAST;  Surgeon: Radiologist, Medication, MD;  Location: [facility] OR;  Service: Radiology;  Laterality: N/A;   RADIOLOGY WITH ANESTHESIA N/A 08/22/2021   Procedure: MRI BRAIN WITH AND WITHOUT CONTRAST  WITH ANESTHESIA;  Surgeon: Radiologist, Medication, MD;  Location: [facility] OR;  Service: Radiology;  Laterality: N/A;   RADIOLOGY WITH ANESTHESIA N/A 12/17/2021   Procedure: MRI BRAIN WITH AND WITHOUT CONTRAST WITH ANESTHESIA; MRI LUMBER WITH AND WITHOUT CONTRAST;  Surgeon: Radiologist, Medication, MD;  Location: [facility] OR;  Service: Radiology;  Laterality: N/A;   RADIOLOGY WITH ANESTHESIA N/A 04/08/2022   Procedure: MRI LUMBER SPINE WITH AND WITHOUT CONTRAST;   Surgeon: Radiologist, Medication, MD;  Location: [facility] OR;  Service: Radiology;  Laterality: N/A;   TONSILLECTOMY     VIDEO BRONCHOSCOPY WITH ENDOBRONCHIAL ULTRASOUND N/A 06/17/2021   Procedure: VIDEO BRONCHOSCOPY WITH ENDOBRONCHIAL ULTRASOUND;  Surgeon: Lorin Glass, MD;  Location: [facility] ENDOSCOPY;  Service: Pulmonary;  Laterality: N/A;    SOCIAL HISTORY: Social History   Socioeconomic History   Marital status: Married    Spouse name: Not on file   Number of children: Not on file   Years of education: Not on file   Highest education level: Not on file  Occupational History   Not on file  Tobacco Use   Smoking status: Former    Current packs/day: 0.00    Types: Cigarettes    Quit date: 07/21/1990    Years since quitting: 32.2   Smokeless tobacco: Never  Vaping Use   Vaping status: Never Used  Substance and Sexual Activity   Alcohol use: No   Drug use: No   Sexual activity: Not Currently  Other Topics Concern   Not on file  Social History Narrative   Not on file   Social Determinants of Health   Financial Resource Strain: Low Risk  (04/17/2022)   Overall Financial Resource Strain (CARDIA)    Difficulty of Paying Living Expenses: Not hard at all  Food Insecurity: No Food Insecurity (04/17/2022)   Hunger Vital Sign    Worried About Running Out of Food in the Last Year: Never true    Ran Out of Food in the Last Year: Never true  Transportation Needs: No Transportation Needs (03/27/2022)   PRAPARE - Administrator, Civil Service (Medical): No    Lack of Transportation (Non-Medical): No  Physical Activity: Inactive (03/27/2022)   Exercise Vital Sign    Days of Exercise per Week: 0 days    Minutes of Exercise per Session: 0 min  Stress: Stress Concern Present (03/27/2022)   [employer] of Occupational Health - Occupational Stress Questionnaire    Feeling of Stress : To some extent  Social Connections: Moderately Isolated (03/27/2022)   Social Connection and  Isolation Panel [NHANES]    Frequency of Communication with Friends and Family: More than three times a week    Frequency of Social Gatherings with Friends and Family: Three times a week    Attends Religious Services: Never    Active Member of Clubs or Organizations: No    Attends Banker Meetings: Never    Marital Status: Married  Catering manager Violence: Not At Risk (03/27/2022)   Humiliation, Afraid, Rape, and Kick questionnaire    Fear of Current or Ex-Partner: No    Emotionally Abused: No    Physically Abused: No    Sexually Abused: No    FAMILY HISTORY: Family History  Problem Relation Age of Onset   COPD Mother  Heart disease Father    Heart disease Brother        MI at age 37   Stroke Neg Hx     ALLERGIES:  is allergic to niaspan [niacin].  MEDICATIONS:  Current Outpatient Medications  Medication Sig Dispense Refill   aspirin EC 81 MG tablet Take 1 tablet (81 mg total) by mouth daily. Swallow whole. 30 tablet 12   atorvastatin (LIPITOR) 40 MG tablet TAKE 1 TABLET(40 MG) BY MOUTH DAILY 60 tablet 2   Ca Phosphate-Cholecalciferol (CALCIUM WITH D3 PO) Take 1 capsule by mouth daily.     folic acid (FOLVITE) 800 MCG tablet Take 1 tablet (800 mcg total) by mouth daily. 60 tablet 1   furosemide (LASIX) 20 MG tablet TAKE 1 TABLET(20 MG) BY MOUTH DAILY AS NEEDED FOR LEG SWELLING OR SHORTNESS OF BREATH 90 tablet 0   gabapentin (NEURONTIN) 300 MG capsule TAKE 1 CAPSULE(300 MG) BY MOUTH THREE TIMES DAILY 90 capsule 1   insulin glargine (LANTUS) 100 UNIT/ML injection Inject 30 Units into the skin daily as needed. Only takes Lantus when sugar is above 150.     megestrol (MEGACE) 40 MG tablet Take 1 tablet (40 mg total) by mouth daily. 30 tablet 3   NOVOLOG FLEXPEN 100 UNIT/ML FlexPen Inject 0-10 Units into the skin daily as needed for high blood sugar (above 200). Sliding scale     nystatin (MYCOSTATIN) 100000 UNIT/ML suspension Take 5 mLs (500,000 Units total) by  mouth 4 (four) times daily. Swish and spit 473 mL 2   omeprazole (PRILOSEC) 20 MG capsule Take 20 mg by mouth daily.     oxyCODONE-acetaminophen (PERCOCET) 7.5-325 MG tablet Take 1 tablet by mouth every 4 (four) hours as needed for severe pain. Do not take with cough medication or other pain medication 60 tablet 0   potassium chloride (KLOR-CON M) 10 MEQ tablet Take 2 tablets (20 mEq total) by mouth daily. 60 tablet 11   Probiotic Product (PROBIOTIC DAILY PO) Take by mouth. Physician's Choice Probiotic     solifenacin (VESICARE) 10 MG tablet Take 1 tablet (10 mg total) by mouth daily. 30 tablet 11   tamsulosin (FLOMAX) 0.4 MG CAPS capsule TAKE 1 CAPSULE(0.4 MG) BY MOUTH DAILY 30 capsule 0   UNABLE TO FIND PLEASE CHECK FASTING BLOOD GLUCOSE EVERY MORNING 1 each 0   ALPRAZolam (XANAX) 0.5 MG tablet Take 1 tablet (0.5 mg total) by mouth daily as needed for anxiety (prior to radiation.). (Patient not taking: Reported on 09/29/2022) 30 tablet 0   fentaNYL (DURAGESIC) 75 MCG/HR Place 1 patch onto the skin every 3 (three) days. 10 patch 0   gatifloxacin (ZYMAXID) 0.5 % SOLN Place 1 drop into the left eye 4 (four) times daily. (Patient not taking: Reported on 09/29/2022)     traZODone (DESYREL) 50 MG tablet Take 1 tablet (50 mg total) by mouth at bedtime as needed for sleep. (Patient not taking: Reported on 09/29/2022) 30 tablet 3   No current facility-administered medications for this visit.   Facility-Administered Medications Ordered in Other Visits  Medication Dose Route Frequency Provider Last Rate Last Admin   heparin lock flush 100 UNIT/ML injection              PHYSICAL EXAMINATION:  Vitals:   09/29/22 1317  BP: 134/61  Pulse: 66  Resp: 18  Temp: (!) 97.4 F (36.3 C)   Filed Weights   09/29/22 1317  Weight: 132 lb 8 oz (60.1 kg)    Physical  Exam Constitutional:      General: He is not in acute distress.    Appearance: He is ill-appearing.  HENT:     Head: Normocephalic and  atraumatic.  Eyes:     General: No scleral icterus. Cardiovascular:     Rate and Rhythm: Normal rate and regular rhythm.     Heart sounds: Normal heart sounds.  Pulmonary:     Effort: Pulmonary effort is normal. No respiratory distress.     Breath sounds: No wheezing.     Comments: Decreased breath sound bilaterally. Abdominal:     General: Bowel sounds are normal. There is no distension.     Palpations: Abdomen is soft.  Musculoskeletal:        General: No deformity. Normal range of motion.     Cervical back: Normal range of motion and neck supple.     Comments: Bilateral ankle trace edema.  Skin:    General: Skin is warm and dry.     Findings: No rash.  Neurological:     Mental Status: He is alert. Mental status is at baseline.     Cranial Nerves: No cranial nerve deficit.     Coordination: Coordination normal.  Psychiatric:        Mood and Affect: Mood normal.     LABORATORY DATA:  I have reviewed the data as listed    Latest Ref Rng & Units 09/29/2022   12:59 PM 09/08/2022    2:06 PM 08/18/2022    2:17 PM  CBC  WBC 4.0 - 10.5 K/uL 6.6  4.4  3.2   Hemoglobin 13.0 - 17.0 g/dL 9.0  9.9  86.5   Hematocrit 39.0 - 52.0 % 28.0  30.0  34.9   Platelets 150 - 400 K/uL 383  334  218       Latest Ref Rng & Units 09/29/2022   12:59 PM 09/08/2022    2:06 PM 08/18/2022    2:17 PM  CMP  Glucose 70 - 99 mg/dL 784  696  295   BUN 8 - 23 mg/dL 24  17  14    Creatinine 0.61 - 1.24 mg/dL 2.84  1.32  4.40   Sodium 135 - 145 mmol/L 137  132  134   Potassium 3.5 - 5.1 mmol/L 4.0  3.0  3.5   Chloride 98 - 111 mmol/L 108  102  104   CO2 22 - 32 mmol/L 21  22  23    Calcium 8.9 - 10.3 mg/dL 8.8  8.8  9.0   Total Protein 6.5 - 8.1 g/dL 7.1  7.2  6.7   Total Bilirubin 0.3 - 1.2 mg/dL 0.3  0.3  0.4   Alkaline Phos 38 - 126 U/L 69  67  58   AST 15 - 41 U/L 15  25  21    ALT 0 - 44 U/L 13  20  13       Iron/TIBC/Ferritin/ %Sat    Component Value Date/Time   IRON 60 02/03/2022 0958   TIBC  391 02/03/2022 0958   FERRITIN 704 (H) 02/03/2022 0958   IRONPCTSAT 15 (L) 02/03/2022 1027      RADIOGRAPHIC STUDIES: I have personally reviewed the radiological images as listed and agreed with the findings in the report. No results found.

## 2022-10-01 ENCOUNTER — Inpatient Hospital Stay
Admission: EM | Admit: 2022-10-01 | Discharge: 2022-10-06 | DRG: 100 | Disposition: A | Discharge status: Home Health | Payer: PPO | Attending: Internal Medicine | Admitting: Internal Medicine

## 2022-10-01 ENCOUNTER — Emergency Department: Payer: PPO

## 2022-10-01 ENCOUNTER — Other Ambulatory Visit: Payer: Self-pay

## 2022-10-01 ENCOUNTER — Inpatient Hospital Stay: Payer: PPO

## 2022-10-01 ENCOUNTER — Telehealth: Payer: Self-pay | Admitting: *Deleted

## 2022-10-01 DIAGNOSIS — C7951 Secondary malignant neoplasm of bone: Secondary | ICD-10-CM | POA: Diagnosis present

## 2022-10-01 DIAGNOSIS — I10 Essential (primary) hypertension: Secondary | ICD-10-CM | POA: Diagnosis not present

## 2022-10-01 DIAGNOSIS — M48061 Spinal stenosis, lumbar region without neurogenic claudication: Secondary | ICD-10-CM | POA: Diagnosis not present

## 2022-10-01 DIAGNOSIS — J019 Acute sinusitis, unspecified: Secondary | ICD-10-CM | POA: Diagnosis not present

## 2022-10-01 DIAGNOSIS — I214 Non-ST elevation (NSTEMI) myocardial infarction: Secondary | ICD-10-CM

## 2022-10-01 DIAGNOSIS — I110 Hypertensive heart disease with heart failure: Secondary | ICD-10-CM | POA: Diagnosis not present

## 2022-10-01 DIAGNOSIS — I5032 Chronic diastolic (congestive) heart failure: Secondary | ICD-10-CM | POA: Diagnosis not present

## 2022-10-01 DIAGNOSIS — E1165 Type 2 diabetes mellitus with hyperglycemia: Secondary | ICD-10-CM | POA: Diagnosis present

## 2022-10-01 DIAGNOSIS — E871 Hypo-osmolality and hyponatremia: Secondary | ICD-10-CM | POA: Diagnosis present

## 2022-10-01 DIAGNOSIS — I2510 Atherosclerotic heart disease of native coronary artery without angina pectoris: Secondary | ICD-10-CM | POA: Diagnosis not present

## 2022-10-01 DIAGNOSIS — E7800 Pure hypercholesterolemia, unspecified: Secondary | ICD-10-CM | POA: Diagnosis not present

## 2022-10-01 DIAGNOSIS — M4726 Other spondylosis with radiculopathy, lumbar region: Secondary | ICD-10-CM | POA: Diagnosis not present

## 2022-10-01 DIAGNOSIS — E440 Moderate protein-calorie malnutrition: Secondary | ICD-10-CM | POA: Diagnosis not present

## 2022-10-01 DIAGNOSIS — Z85820 Personal history of malignant melanoma of skin: Secondary | ICD-10-CM

## 2022-10-01 DIAGNOSIS — G8929 Other chronic pain: Secondary | ICD-10-CM | POA: Diagnosis not present

## 2022-10-01 DIAGNOSIS — R918 Other nonspecific abnormal finding of lung field: Secondary | ICD-10-CM | POA: Diagnosis not present

## 2022-10-01 DIAGNOSIS — Z6820 Body mass index (BMI) 20.0-20.9, adult: Secondary | ICD-10-CM

## 2022-10-01 DIAGNOSIS — R569 Unspecified convulsions: Secondary | ICD-10-CM | POA: Diagnosis not present

## 2022-10-01 DIAGNOSIS — I69198 Other sequelae of nontraumatic intracerebral hemorrhage: Secondary | ICD-10-CM

## 2022-10-01 DIAGNOSIS — Z8249 Family history of ischemic heart disease and other diseases of the circulatory system: Secondary | ICD-10-CM

## 2022-10-01 DIAGNOSIS — G9389 Other specified disorders of brain: Secondary | ICD-10-CM | POA: Diagnosis not present

## 2022-10-01 DIAGNOSIS — I2699 Other pulmonary embolism without acute cor pulmonale: Secondary | ICD-10-CM | POA: Diagnosis present

## 2022-10-01 DIAGNOSIS — Z79891 Long term (current) use of opiate analgesic: Secondary | ICD-10-CM

## 2022-10-01 DIAGNOSIS — E11319 Type 2 diabetes mellitus with unspecified diabetic retinopathy without macular edema: Secondary | ICD-10-CM | POA: Diagnosis not present

## 2022-10-01 DIAGNOSIS — I959 Hypotension, unspecified: Secondary | ICD-10-CM | POA: Diagnosis present

## 2022-10-01 DIAGNOSIS — Z87891 Personal history of nicotine dependence: Secondary | ICD-10-CM

## 2022-10-01 DIAGNOSIS — Z8679 Personal history of other diseases of the circulatory system: Secondary | ICD-10-CM | POA: Diagnosis not present

## 2022-10-01 DIAGNOSIS — Z7982 Long term (current) use of aspirin: Secondary | ICD-10-CM

## 2022-10-01 DIAGNOSIS — I2489 Other forms of acute ischemic heart disease: Secondary | ICD-10-CM | POA: Diagnosis not present

## 2022-10-01 DIAGNOSIS — C3412 Malignant neoplasm of upper lobe, left bronchus or lung: Secondary | ICD-10-CM | POA: Diagnosis not present

## 2022-10-01 DIAGNOSIS — C3490 Malignant neoplasm of unspecified part of unspecified bronchus or lung: Secondary | ICD-10-CM

## 2022-10-01 DIAGNOSIS — R531 Weakness: Secondary | ICD-10-CM | POA: Diagnosis not present

## 2022-10-01 DIAGNOSIS — I639 Cerebral infarction, unspecified: Secondary | ICD-10-CM | POA: Diagnosis not present

## 2022-10-01 DIAGNOSIS — I69154 Hemiplegia and hemiparesis following nontraumatic intracerebral hemorrhage affecting left non-dominant side: Secondary | ICD-10-CM

## 2022-10-01 DIAGNOSIS — G939 Disorder of brain, unspecified: Secondary | ICD-10-CM | POA: Diagnosis not present

## 2022-10-01 DIAGNOSIS — I259 Chronic ischemic heart disease, unspecified: Secondary | ICD-10-CM | POA: Insufficient documentation

## 2022-10-01 DIAGNOSIS — Z86711 Personal history of pulmonary embolism: Secondary | ICD-10-CM

## 2022-10-01 DIAGNOSIS — R55 Syncope and collapse: Secondary | ICD-10-CM | POA: Diagnosis not present

## 2022-10-01 DIAGNOSIS — Z79899 Other long term (current) drug therapy: Secondary | ICD-10-CM

## 2022-10-01 DIAGNOSIS — Z9221 Personal history of antineoplastic chemotherapy: Secondary | ICD-10-CM

## 2022-10-01 DIAGNOSIS — E785 Hyperlipidemia, unspecified: Secondary | ICD-10-CM | POA: Diagnosis not present

## 2022-10-01 DIAGNOSIS — D638 Anemia in other chronic diseases classified elsewhere: Secondary | ICD-10-CM | POA: Diagnosis present

## 2022-10-01 DIAGNOSIS — I951 Orthostatic hypotension: Secondary | ICD-10-CM | POA: Diagnosis not present

## 2022-10-01 DIAGNOSIS — Z1152 Encounter for screening for COVID-19: Secondary | ICD-10-CM

## 2022-10-01 DIAGNOSIS — R4701 Aphasia: Secondary | ICD-10-CM | POA: Diagnosis present

## 2022-10-01 DIAGNOSIS — R911 Solitary pulmonary nodule: Secondary | ICD-10-CM | POA: Diagnosis not present

## 2022-10-01 DIAGNOSIS — I619 Nontraumatic intracerebral hemorrhage, unspecified: Secondary | ICD-10-CM

## 2022-10-01 DIAGNOSIS — G8314 Monoplegia of lower limb affecting left nondominant side: Secondary | ICD-10-CM | POA: Diagnosis not present

## 2022-10-01 DIAGNOSIS — I6523 Occlusion and stenosis of bilateral carotid arteries: Secondary | ICD-10-CM | POA: Diagnosis present

## 2022-10-01 DIAGNOSIS — E1169 Type 2 diabetes mellitus with other specified complication: Secondary | ICD-10-CM | POA: Diagnosis not present

## 2022-10-01 DIAGNOSIS — I614 Nontraumatic intracerebral hemorrhage in cerebellum: Secondary | ICD-10-CM | POA: Diagnosis present

## 2022-10-01 DIAGNOSIS — D630 Anemia in neoplastic disease: Secondary | ICD-10-CM | POA: Diagnosis present

## 2022-10-01 DIAGNOSIS — I6782 Cerebral ischemia: Secondary | ICD-10-CM | POA: Diagnosis not present

## 2022-10-01 DIAGNOSIS — I69120 Aphasia following nontraumatic intracerebral hemorrhage: Secondary | ICD-10-CM

## 2022-10-01 DIAGNOSIS — I4510 Unspecified right bundle-branch block: Secondary | ICD-10-CM | POA: Diagnosis not present

## 2022-10-01 DIAGNOSIS — I952 Hypotension due to drugs: Secondary | ICD-10-CM | POA: Diagnosis not present

## 2022-10-01 DIAGNOSIS — K219 Gastro-esophageal reflux disease without esophagitis: Secondary | ICD-10-CM | POA: Diagnosis present

## 2022-10-01 DIAGNOSIS — Z794 Long term (current) use of insulin: Secondary | ICD-10-CM

## 2022-10-01 DIAGNOSIS — C7931 Secondary malignant neoplasm of brain: Secondary | ICD-10-CM | POA: Diagnosis present

## 2022-10-01 DIAGNOSIS — I3139 Other pericardial effusion (noninflammatory): Secondary | ICD-10-CM | POA: Diagnosis not present

## 2022-10-01 DIAGNOSIS — I5030 Unspecified diastolic (congestive) heart failure: Secondary | ICD-10-CM | POA: Insufficient documentation

## 2022-10-01 DIAGNOSIS — N401 Enlarged prostate with lower urinary tract symptoms: Secondary | ICD-10-CM | POA: Diagnosis present

## 2022-10-01 DIAGNOSIS — M4807 Spinal stenosis, lumbosacral region: Secondary | ICD-10-CM | POA: Diagnosis not present

## 2022-10-01 DIAGNOSIS — J329 Chronic sinusitis, unspecified: Secondary | ICD-10-CM

## 2022-10-01 DIAGNOSIS — Z825 Family history of asthma and other chronic lower respiratory diseases: Secondary | ICD-10-CM

## 2022-10-01 LAB — COMPREHENSIVE METABOLIC PANEL
ALT: 17 U/L (ref 0–44)
AST: 29 U/L (ref 15–41)
Albumin: 3.2 g/dL — ABNORMAL LOW (ref 3.5–5.0)
Alkaline Phosphatase: 71 U/L (ref 38–126)
Anion gap: 7 (ref 5–15)
BUN: 24 mg/dL — ABNORMAL HIGH (ref 8–23)
CO2: 20 mmol/L — ABNORMAL LOW (ref 22–32)
Calcium: 8.7 mg/dL — ABNORMAL LOW (ref 8.9–10.3)
Chloride: 103 mmol/L (ref 98–111)
Creatinine, Ser: 1.23 mg/dL (ref 0.61–1.24)
GFR, Estimated: >60 mL/min (ref >60)
Glucose, Bld: 88 mg/dL (ref 70–99)
Potassium: 4.3 mmol/L (ref 3.5–5.1)
Sodium: 130 mmol/L — ABNORMAL LOW (ref 135–145)
Total Bilirubin: 1.2 mg/dL (ref 0.3–1.2)
Total Protein: 7.1 g/dL (ref 6.5–8.1)

## 2022-10-01 LAB — CBC
HCT: 32.2 % — ABNORMAL LOW (ref 39.0–52.0)
Hemoglobin: 10.2 g/dL — ABNORMAL LOW (ref 13.0–17.0)
MCH: 28.7 pg (ref 26.0–34.0)
MCHC: 31.7 g/dL (ref 30.0–36.0)
MCV: 90.4 fL (ref 80.0–100.0)
Platelets: 262 10*3/uL (ref 150–400)
RBC: 3.56 MIL/uL — ABNORMAL LOW (ref 4.22–5.81)
RDW: 14 % (ref 11.5–15.5)
WBC: 4.9 10*3/uL (ref 4.0–10.5)
nRBC: 0 % (ref 0.0–0.2)

## 2022-10-01 LAB — URINALYSIS, W/ REFLEX TO CULTURE (INFECTION SUSPECTED)
Bilirubin Urine: NEGATIVE
Glucose, UA: NEGATIVE mg/dL
Ketones, ur: NEGATIVE mg/dL
Leukocytes,Ua: NEGATIVE
Nitrite: NEGATIVE
Protein, ur: NEGATIVE mg/dL
Specific Gravity, Urine: 1.014 (ref 1.005–1.030)
Squamous Epithelial / HPF: NONE SEEN /HPF (ref 0–5)
pH: 5 (ref 5.0–8.0)

## 2022-10-01 LAB — TROPONIN I (HIGH SENSITIVITY)
Troponin I (High Sensitivity): 137 ng/L (ref <18)
Troponin I (High Sensitivity): 117 ng/L (ref <18)
Troponin I (High Sensitivity): 94 ng/L — ABNORMAL HIGH (ref <18)
Troponin I (High Sensitivity): 90 ng/L — ABNORMAL HIGH (ref <18)

## 2022-10-01 LAB — SARS CORONAVIRUS 2 BY RT PCR: SARS Coronavirus 2 by RT PCR: NEGATIVE

## 2022-10-01 LAB — GLUCOSE, CAPILLARY: Glucose-Capillary: 186 mg/dL — ABNORMAL HIGH (ref 70–99)

## 2022-10-01 MED ORDER — SODIUM CHLORIDE 0.9 % IV BOLUS
1000.0000 mL | Freq: Once | INTRAVENOUS | Status: AC
  Administered 2022-10-01: 1000 mL via INTRAVENOUS

## 2022-10-01 MED ORDER — ALPRAZOLAM 1 MG PO TABS
1.0000 mg | ORAL_TABLET | Freq: Once | ORAL | Status: AC | PRN
  Administered 2022-10-01: 1 mg via ORAL
  Filled 2022-10-01: qty 1

## 2022-10-01 MED ORDER — IOHEXOL 350 MG/ML SOLN
75.0000 mL | Freq: Once | INTRAVENOUS | Status: AC | PRN
  Administered 2022-10-01: 125 mL via INTRAVENOUS

## 2022-10-01 MED ORDER — INSULIN ASPART 100 UNIT/ML IJ SOLN
0.0000 [IU] | Freq: Three times a day (TID) | INTRAMUSCULAR | Status: DC
  Administered 2022-10-02 (×2): 1 [IU] via SUBCUTANEOUS
  Administered 2022-10-03: 2 [IU] via SUBCUTANEOUS
  Administered 2022-10-03: 1 [IU] via SUBCUTANEOUS
  Administered 2022-10-04 (×2): 2 [IU] via SUBCUTANEOUS
  Administered 2022-10-05: 1 [IU] via SUBCUTANEOUS
  Administered 2022-10-06 (×2): 2 [IU] via SUBCUTANEOUS
  Filled 2022-10-01 (×8): qty 1

## 2022-10-01 MED ORDER — GADOBUTROL 1 MMOL/ML IV SOLN
6.0000 mL | Freq: Once | INTRAVENOUS | Status: AC | PRN
  Administered 2022-10-02: 6 mL via INTRAVENOUS

## 2022-10-01 MED ORDER — STROKE: EARLY STAGES OF RECOVERY BOOK: Freq: Once | Status: AC

## 2022-10-01 MED ORDER — LORAZEPAM 0.5 MG PO TABS: 0.5000 mg | ORAL_TABLET | Freq: Once | ORAL | Status: DC | PRN

## 2022-10-01 MED ORDER — ALPRAZOLAM 0.25 MG PO TABS
0.2500 mg | ORAL_TABLET | Freq: Once | ORAL | Status: DC | PRN
  Filled 2022-10-01: qty 1

## 2022-10-01 MED ORDER — DOXYCYCLINE HYCLATE 100 MG PO TABS
100.0000 mg | ORAL_TABLET | Freq: Two times a day (BID) | ORAL | Status: DC
  Administered 2022-10-01 – 2022-10-06 (×10): 100 mg via ORAL
  Filled 2022-10-01 (×10): qty 1

## 2022-10-01 NOTE — ED Provider Notes (Signed)
Consulted with and admission accepted by Dr. Amada Kingfisher, MD 10/01/22 [id number]

## 2022-10-01 NOTE — Assessment & Plan Note (Signed)
Allow for permissive hypertension in the setting of CVA evaluation As needed IV labetalol for systolic pressure greater than 220 or diastolic pressure greater than 110

## 2022-10-01 NOTE — Telephone Encounter (Signed)
Wife called and stated pt is very weak since getting Keytruda treatment on Monday.  Reports pt is unable to walk on own. Wife and daughter taking patient to emergency room at [facility]. Nurse verbalized understanding and stated she would inform MD and team.

## 2022-10-01 NOTE — Assessment & Plan Note (Addendum)
Noted worsening baseline weakness of left lower extremity in addition to mild confusion and aphasia Baseline history of hemorrhagic CVA, lung cancer with metastatic disease Physical therapy recommending rehab.  Patient did better today with physical therapy and Occupational Therapy today.  Patient and wife interested in going home with home health.

## 2022-10-01 NOTE — Assessment & Plan Note (Signed)
Prior history of pulmonary embolism.  Not on anticoagulation secondary to brain bleed.

## 2022-10-01 NOTE — Assessment & Plan Note (Signed)
Noted progressive bilateral proximal cervical ICA stenoses, 75% or greater on the left and 60% on the right. Moderate to severe stenosis of the left vertebral artery origin and severe multifocal stenoses and/or segmental occlusions of the left V4 segment noted on imaging.  Case discussed w/ Dr. Selina Cooley  Follow up formal neuro recs in am.

## 2022-10-01 NOTE — Assessment & Plan Note (Addendum)
2D echo April 2024 with EF of 55 to 60% and grade 1 diastolic dysfunction Chronic in nature.  No signs of heart failure with hydration.

## 2022-10-01 NOTE — ED Provider Notes (Signed)
Discussed with Dr. Selina Cooley of neurology, advises findings on CT of the head appear to be expected evolution and she also discussed with radiologist advising no evidence of new hemorrhage  At this juncture given the patient's need for admission, recommends MRI with and without contrast moving forward during admission.  Hold antiplatelet agents until further advised by neurology, Dr. Selina Cooley will continue to follow and consult on the patient inpatient   Sharyn Creamer, MD 10/01/22 1600

## 2022-10-01 NOTE — ED Triage Notes (Signed)
Pt to ED for generalized weakness started yesterday. Chemo treatment on Monday.  Denies fever, n/v/d

## 2022-10-01 NOTE — H&P (Signed)
History and Physical    Patient: Micheal Donovan VZD:[id number] DOB: 1950/12/25 DOA: 10/01/2022 DOS: the patient was seen and examined on 10/01/2022 PCP: Donita Brooks, MD  Patient coming from: Home  Chief Complaint:  Chief Complaint  Patient presents with   Weakness   HPI: Micheal Donovan is a 72 y.o. male with medical history significant of multiple medical issues including stage IV lung cancer with mets to the bone, history of cerebral microhemorrhage, history of PE, type 2 diabetes, hypertension, lumbar radiculopathy, BPH, hyperlipidemia presenting with left lower extremity weakness and intermittent aphasia.  History from patient as well as his family.  Per report, patient with acute on chronic left lower extremity weakness.  Weakness has been more predominant over the past 2 weeks.  Patient had significant episode of difficulty with ambulation over the past 24 hours.  Also with intermittent episodes of patient staring off as well as aphasia.  Noted to be followed by Dr. Cathie Hoops outpatient with oncology.  Recently completed course of chemotherapy-Keytruda.  No chest pain or shortness of breath.  No nausea or vomiting.  Weakness appears to be chronic issue.  No reported headache, vision changes. Presented to the ER afebrile, hemodynamically stable.  White count 4.9, hemoglobin 10.2, creatinine 123, sodium 130.  Troponin 1 37-1 17.  CT imaging with evolving hemorrhage of left cerebral hemisphere.  Frothy secretions in the right sphenoid sinus concerning for sinusitis.  CT angio chest negative for PE. Noted Progressive bilateral proximal cervical ICA stenoses, 75% or greater on the left and 60% on the right, moderate to severe stenosis of the left vertebral artery origin and severe multifocal stenoses and/or segmental occlusions of the left V4 segment. EKG NSR w/ RBBB.  Review of Systems: As mentioned in the history of present illness. All other systems reviewed and are negative. Past Medical History:   Diagnosis Date   Allergy    BPH (benign prostatic hypertrophy)    Cancer (HCC)    Melanoma on Neck    2008   Carotid artery occlusion    CHF (congestive heart failure) (HCC)    Diabetes mellitus    type 2   ED (erectile dysfunction)    GERD (gastroesophageal reflux disease)    Hemorrhagic stroke (HCC) 06/2021   Hyperlipidemia    Hypertension    Lung cancer (HCC)    Neoplasm related pain    Retinopathy due to secondary DM [facility])    Past Surgical History:  Procedure Laterality Date   BRONCHIAL NEEDLE ASPIRATION BIOPSY  06/17/2021   Procedure: BRONCHIAL NEEDLE ASPIRATION BIOPSIES;  Surgeon: Lorin Glass, MD;  Location: [facility] ENDOSCOPY;  Service: Pulmonary;;   IR IMAGING GUIDED PORT INSERTION  08/05/2021   MELANOMA EXCISION  2008   Left side of neck   RADIOLOGY WITH ANESTHESIA N/A 04/05/2020   Procedure: MRI SPINE WITOUT CONTRAST;  Surgeon: Radiologist, Medication, MD;  Location: [facility] OR;  Service: Radiology;  Laterality: N/A;   RADIOLOGY WITH ANESTHESIA N/A 08/22/2021   Procedure: MRI BRAIN WITH AND WITHOUT CONTRAST  WITH ANESTHESIA;  Surgeon: Radiologist, Medication, MD;  Location: [facility] OR;  Service: Radiology;  Laterality: N/A;   RADIOLOGY WITH ANESTHESIA N/A 12/17/2021   Procedure: MRI BRAIN WITH AND WITHOUT CONTRAST WITH ANESTHESIA; MRI LUMBER WITH AND WITHOUT CONTRAST;  Surgeon: Radiologist, Medication, MD;  Location: [facility] OR;  Service: Radiology;  Laterality: N/A;   RADIOLOGY WITH ANESTHESIA N/A 04/08/2022   Procedure: MRI LUMBER SPINE WITH AND WITHOUT CONTRAST;  Surgeon: Radiologist, Medication, MD;  Location: [facility] OR;  Service: Radiology;  Laterality: N/A;   TONSILLECTOMY     VIDEO BRONCHOSCOPY WITH ENDOBRONCHIAL ULTRASOUND N/A 06/17/2021   Procedure: VIDEO BRONCHOSCOPY WITH ENDOBRONCHIAL ULTRASOUND;  Surgeon: Lorin Glass, MD;  Location: [facility] ENDOSCOPY;  Service: Pulmonary;  Laterality: N/A;   Social History:  reports that he quit smoking about 32 years ago. His smoking use included  cigarettes. He has never used smokeless tobacco. He reports that he does not drink alcohol and does not use drugs.  Allergies  Allergen Reactions   Niaspan [Niacin]     FLUSHING    Family History  Problem Relation Age of Onset   COPD Mother    Heart disease Father    Heart disease Brother        MI at age 33   Stroke Neg Hx     Prior to Admission medications   Medication Sig Start Date End Date Taking? Authorizing Provider  aspirin EC 81 MG tablet Take 1 tablet (81 mg total) by mouth daily. Swallow whole. 02/21/22  Yes Vaslow, Georgeanna Lea, MD  atorvastatin (LIPITOR) 40 MG tablet TAKE 1 TABLET(40 MG) BY MOUTH DAILY 08/07/22  Yes Vaslow, Georgeanna Lea, MD  docusate sodium (COLACE) 100 MG capsule Take 100 mg by mouth 2 (two) times daily as needed for mild constipation.   Yes [provider]  fentaNYL (DURAGESIC) 75 MCG/HR Place 1 patch onto the skin every 3 (three) days. 09/29/22  Yes [name], MD  furosemide (LASIX) 20 MG tablet TAKE 1 TABLET(20 MG) BY MOUTH DAILY AS NEEDED FOR LEG SWELLING OR SHORTNESS OF BREATH 08/07/22  Yes Donita Brooks, MD  gabapentin (NEURONTIN) 300 MG capsule TAKE 1 CAPSULE(300 MG) BY MOUTH THREE TIMES DAILY 09/11/22  Yes [name], MD  insulin glargine (LANTUS) 100 UNIT/ML injection Inject 30 Units into the skin daily as needed (BG > 150).   Yes [provider]  megestrol (MEGACE) 40 MG tablet Take 1 tablet (40 mg total) by mouth daily. 09/22/22  Yes Donita Brooks, MD  NOVOLOG FLEXPEN 100 UNIT/ML FlexPen Inject 0-10 Units into the skin daily as needed for high blood sugar (BG > 200).   Yes [provider]  Omeprazole 20 MG TBEC Take 20 mg by mouth daily.   Yes [provider]  oxyCODONE-acetaminophen (PERCOCET) 7.5-325 MG tablet Take 1 tablet by mouth every 4 (four) hours as needed for severe pain. Do not take with cough medication or other pain medication 09/22/22  Yes [name], MD  potassium chloride (KLOR-CON M) 10 MEQ tablet Take 2  tablets (20 mEq total) by mouth daily. Patient taking differently: Take 10 mEq by mouth daily. 09/09/22  Yes Donita Brooks, MD  solifenacin (VESICARE) 10 MG tablet Take 1 tablet (10 mg total) by mouth daily. 09/09/22  Yes Donita Brooks, MD  tamsulosin (FLOMAX) 0.4 MG CAPS capsule TAKE 1 CAPSULE(0.4 MG) BY MOUTH DAILY 06/09/22  Yes [name], MD  traZODone (DESYREL) 50 MG tablet Take 1 tablet (50 mg total) by mouth at bedtime as needed for sleep. 08/07/22  Yes Borders, Daryl Eastern, NP  valsartan (DIOVAN) 80 MG tablet Take 80 mg by mouth daily.   Yes [provider]  ALPRAZolam Prudy Feeler) 0.5 MG tablet Take 1 tablet (0.5 mg total) by mouth daily as needed for anxiety (prior to radiation.). Patient not taking: Reported on 09/29/2022 07/30/22   [name], MD  folic acid (FOLVITE) 800 MCG tablet Take 1 tablet (800 mcg total)  by mouth daily. Patient not taking: Reported on 10/01/2022 04/05/22   [name], MD  nystatin (MYCOSTATIN) 100000 UNIT/ML suspension Take 5 mLs (500,000 Units total) by mouth 4 (four) times daily. Swish and spit Patient not taking: Reported on 10/01/2022 08/18/22   [name], MD  UNABLE TO FIND PLEASE CHECK FASTING BLOOD GLUCOSE EVERY MORNING 07/12/21   Donita Brooks, MD  prochlorperazine (COMPAZINE) 10 MG tablet Take 1 tablet (10 mg total) by mouth every 6 (six) hours as needed (Nausea or vomiting). 07/23/21 08/06/21  [name], MD    Physical Exam: Vitals:   10/01/22 1259 10/01/22 1300 10/01/22 1340 10/01/22 1500  BP: 98/61  127/64 130/61  Pulse: 96  78 72  Resp: 20  18 18   Temp: 98.2 F (36.8 C)     SpO2: 100%  100% 100%  Weight:  59.9 kg    Height:  5\' 7"  (1.702 m)     Physical Exam Constitutional:      Comments: Underweight   HENT:     Head: Normocephalic and atraumatic.     Mouth/Throat:     Mouth: Mucous membranes are dry.  Eyes:     Pupils: Pupils are equal, round, and reactive to light.  Cardiovascular:     Rate and Rhythm: Normal rate and regular rhythm.   Pulmonary:     Effort: Pulmonary effort is normal.     Breath sounds: Normal breath sounds.  Abdominal:     General: Bowel sounds are normal.  Musculoskeletal:     Comments: Mild generalized weakness, predominantly in lower extremities bilaterally    Neurological:     General: No focal deficit present.     Data Reviewed:  There are no new results to review at this time. CT Angio Head Neck W WO CM CLINICAL DATA:  Neuro deficit, acute, stroke suspected. Generalized weakness. History of lung cancer and intracranial hemorrhage.  EXAM: CT ANGIOGRAPHY HEAD AND NECK WITH AND WITHOUT CONTRAST  TECHNIQUE: Multidetector CT imaging of the head and neck was performed using the standard protocol during bolus administration of intravenous contrast. Multiplanar CT image reconstructions and MIPs were obtained to evaluate the vascular anatomy. Carotid stenosis measurements (when applicable) are obtained utilizing NASCET criteria, using the distal internal carotid diameter as the denominator.  RADIATION DOSE REDUCTION: This exam was performed according to the departmental dose-optimization program which includes automated exposure control, adjustment of the mA and/or kV according to patient size and/or use of iterative reconstruction technique.  CONTRAST:  OMNIPAQUE IOHEXOL 350 MG/ML SOLN  COMPARISON:  CTA head and neck 06/12/2021  FINDINGS: CTA NECK FINDINGS  Aortic arch: Standard 3 vessel aortic arch with mixed calcified and soft plaque. No significant stenosis of the arch vessel origins.  Right carotid system: Patent with calcified and soft plaque about the carotid bifurcation resulting in 60% stenosis of the proximal ICA, progressed from prior.  Left carotid system: Patent with heavily calcified plaque about the carotid bifurcation resulting in 75% or greater stenosis, mildly progressed from prior.  Vertebral arteries: Patent with the right vertebral artery  being dominant. Moderate to severe stenosis of the left vertebral artery origin, likely mildly progressed. Progressive, moderate left P3 stenosis.  Skeleton: Interbody and left facet ankylosis at C4-5. Widespread advanced disc degeneration elsewhere in the cervical spine. Advanced facet arthrosis on the right at C2-3 and on the left at C3-4.  Other neck: No enlarged or suspicious lymph nodes in the neck.  Upper chest: Partially visualized left upper  lobe chronic consolidation/fibrosis in the left upper lobe, more fully evaluated on the 07/03/2022 PET-CT.  Review of the MIP images confirms the above findings  CTA HEAD FINDINGS  Anterior circulation: The internal carotid arteries are patent from skull base to carotid termini. Soft plaque in the proximal right cavernous ICA has progressed and now results in a severe stenosis, and there is also new mild stenosis of the proximal left cavernous ICA. ACAs and MCAs are patent with branch vessel irregularity but no evidence of a proximal branch occlusion or significant proximal stenosis. No aneurysm is identified.  Posterior circulation: The intracranial right vertebral artery is patent with moderate stenosis again noted. The left V4 segment is again noted to be heavily diseased with multiple severe stenoses and/or short segment occlusions. Fusiform aneurysmal dilatation to 4 mm diameter of the mid left V4 segment is unchanged. Patent PICA, AICA, and SCA origins are visualized bilaterally. The basilar artery is patent with atherosclerotic irregularity but no flow limiting stenosis. There are left larger than right posterior communicating arteries with hypoplasia of the left P1 segment. Both PCAs are patent without evidence of a significant proximal stenosis.  Venous sinuses: Not well evaluated due to arterial contrast timing.  Anatomic variants: Predominantly fetal type origin of the left PCA.  Review of the MIP images confirms the  above findings  IMPRESSION: 1. No emergent large vessel occlusion. 2. Progressive atherosclerosis in the head and neck including a new severe right cavernous ICA stenosis. 3. Progressive bilateral proximal cervical ICA stenoses, 75% or greater on the left and 60% on the right. 4. Moderate to severe stenosis of the left vertebral artery origin and severe multifocal stenoses and/or segmental occlusions of the left V4 segment. 5. 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 6.  Aortic Atherosclerosis (ICD10-I70.0).  Electronically Signed   By: Sebastian Ache M.D.   On: 10/01/2022 16:33 CT Angio Chest PE W/Cm &/Or Wo Cm CLINICAL DATA:  Weakness. History of non-small-cell lung cancer left upper lobe.  EXAM: CT ANGIOGRAPHY CHEST WITH CONTRAST  TECHNIQUE: Multidetector CT imaging of the chest was performed using the standard protocol during bolus administration of intravenous contrast. Multiplanar CT image reconstructions and MIPs were obtained to evaluate the vascular anatomy.  RADIATION DOSE REDUCTION: This exam was performed according to the departmental dose-optimization program which includes automated exposure control, adjustment of the mA and/or kV according to patient size and/or use of iterative reconstruction technique.  CONTRAST:  OMNIPAQUE IOHEXOL 350 MG/ML SOLN  COMPARISON:  PET-CT 07/03/2022.  Chest CT 04/28/2022  FINDINGS: Cardiovascular: Small pericardial effusion. Heart is nonenlarged. Coronary artery calcifications are seen. The thoracic aorta has a normal course and caliber with partially calcified plaque. No segmental or larger pulmonary embolism identified. Right upper chest port in place with the tip along the upper right atrium.  Mediastinum/Nodes: No specific abnormal lymph node enlargement identified in the axillary region, hilum or mediastinum. Mildly patulous thoracic esophagus. Small heterogeneous thyroid gland.  Lungs/Pleura:  There is some linear opacity seen in the right lung base likely scar or atelectasis. No right-sided consolidation, pneumothorax or effusion. Left upper lobe perihilar nodular opacity once again identified. Previously this measured 3.9 x 1.9 cm and today 3.6 by 1.8 cm on series 6, image 61. No left-sided new consolidation, pneumothorax or effusion. There is smaller area of nodular thickening along the interlobar fissure. On series 6, image 64 this measures 7 mm today and previously with smaller at 4 mm.  Upper Abdomen:  Along the upper abdomen the visualized portions of the adrenal glands are preserved. There is some contrast in the right renal collecting system from prior contrast administration.  Musculoskeletal: Mild degenerative changes seen along the spine. There is sclerotic portion seen to the left seventh rib, unchanged from previous.  Review of the MIP images confirms the above findings.  IMPRESSION: No pulmonary embolism identified.  Stable nodular opacity in the left upper lobe. Please correlate for history of treated lung cancer. There is an adjacent small nodule along the course of the interlobar fissure which is slightly larger today compared to the study of February 2024. please correlate with findings of the recent PET-CT of 07/03/2022.  Aortic Atherosclerosis (ICD10-I70.0).  Electronically Signed   By: Karen Kays M.D.   On: 10/01/2022 16:31 CT Head Wo Contrast CLINICAL DATA:  Syncope/presyncope, cerebrovascular cause suspected  EXAM: CT HEAD WITHOUT CONTRAST  TECHNIQUE: Contiguous axial images were obtained from the base of the skull through the vertex without intravenous contrast.  RADIATION DOSE REDUCTION: This exam was performed according to the departmental dose-optimization program which includes automated exposure control, adjustment of the mA and/or kV according to patient size and/or use of iterative reconstruction technique.  COMPARISON:  MRI  Head 05/18/22  FINDINGS: Brain: Evolving hemorrhage in the left cerebellar hemisphere. No CT evidence of an acute cortical infarct. No new sites of hemorrhage visualized. No extra-axial fluid collection. Sequela of mild chronic microvascular ischemic change. No hydrocephalus.  Vascular: No hyperdense vessel or unexpected calcification.  Skull: Normal. Negative for fracture or focal lesion.  Sinuses/Orbits: No middle ear or mastoid effusion. Paranasal sinuses are notable for frothy secretions in the right sphenoid sinus. Bilateral lens replacement. Orbits are otherwise unremarkable.  Other: None.  IMPRESSION: 1. Evolving hemorrhage in the left cerebellar hemisphere. No new sites of hemorrhage visualized. 2. Frothy secretions in the right sphenoid sinus, which can be seen in the setting of acute sinusitis.  Electronically Signed   By: Lorenza Cambridge M.D.   On: 10/01/2022 15:28  Lab Results  Component Value Date   WBC 4.9 10/01/2022   HGB 10.2 (L) 10/01/2022   HCT 32.2 (L) 10/01/2022   MCV 90.4 10/01/2022   PLT 262 10/01/2022   Last metabolic panel Lab Results  Component Value Date   GLUCOSE 88 10/01/2022   NA 130 (L) 10/01/2022   K 4.3 10/01/2022   CL 103 10/01/2022   CO2 20 (L) 10/01/2022   BUN 24 (H) 10/01/2022   CREATININE 1.23 10/01/2022   GFRNONAA >60 10/01/2022   CALCIUM 8.7 (L) 10/01/2022   PHOS 1.7 (L) 11/20/2021   PROT 7.1 10/01/2022   ALBUMIN 3.2 (L) 10/01/2022   BILITOT 1.2 10/01/2022   ALKPHOS 71 10/01/2022   AST 29 10/01/2022   ALT 17 10/01/2022   ANIONGAP 7 10/01/2022    Assessment and Plan: Weakness Noted worsening baseline weakness of left lower extremity in addition to mild confusion and aphasia Baseline history of hemorrhagic CVA, lung cancer with metastatic disease CT imaging of the head today with noted likely chronic hemorrhagic changes with calcification Case discussed with Dr. Selina Cooley with neurology Plan for formal CVA evaluation with  predominant imaging of MRI of the brain with and without contrast to rule out metastatic changes Defer antiplatelet/anticoagulation for now given hemorrhagic changes Risk stratification labs Follow-up formal neurology recommendations in the morning Noted prior MRI lumbar showing DJD, L4-L5 synovial cyst,PET scan showing L4 fracture may be confounding issue to LE weakness.  Consider repeat imaging  of L spine to better assess  Follow  NSTEMI (non-ST elevated myocardial infarction) (HCC) Troponin 110s to 130s in the setting of concern for CVA No active chest pain EKG normal sinus rhythm right bundle branch block Suspect secondary demand ischemia Deferring antiplatelet regimen out of concern for hemorrhagic changes on CT imaging of the brain Will trend troponin overnight Consider cardiology consult as appropriate   Primary non-small cell carcinoma of upper lobe of left lung (HCC) Followed by Dr. Cathie Hoops outpatient  Baseline Stage IV lung adenocarcinoma with brain and bone metastasis On chemotherapy  Also noted to be followed by Hospice and Palliative care    Sinusitis CT imaging is concerning for acute sinusitis Will place on doxycycline 100 mg twice daily for treatment Monitor  (HFpEF) heart failure with preserved ejection fraction (HCC) 2D echo April 2024 with EF of 55 to 60% and grade 1 diastolic dysfunction Appears mildly dry on exam Gentle hydration Monitor volume status  Essential hypertension Allow for permissive hypertension in the setting of CVA evaluation As needed IV labetalol for systolic pressure greater than 220 or diastolic pressure greater than 110  Pulmonary embolus (HCC) Not on anticoagulation in the setting of history of persistent microbleed's of the brain   Type 2 diabetes mellitus with hyperglycemia (HCC) SSI A1c  Carotid stenosis, bilateral Noted progressive bilateral proximal cervical ICA stenoses, 75% or greater on the left and 60% on the right.  Moderate to severe stenosis of the left vertebral artery origin and severe multifocal stenoses and/or segmental occlusions of the left V4 segment noted on imaging.  Case discussed w/ Dr. Selina Cooley  Follow up formal neuro recs in am.    HYPERCHOLESTEROLEMIA Continue statin      Advance Care Planning:   Code Status: Full Code   Consults: Neurology- Dr. Selina Cooley   Family Communication: Family at the bedside. Plan of care discussed.   Severity of Illness: The appropriate patient status for this patient is INPATIENT. Inpatient status is judged to be reasonable and necessary in order to provide the required intensity of service to ensure the patient's safety. The patient's presenting symptoms, physical exam findings, and initial radiographic and laboratory data in the context of their chronic comorbidities is felt to place them at high risk for further clinical deterioration. Furthermore, it is not anticipated that the patient will be medically stable for discharge from the hospital within 2 midnights of admission.   * I certify that at the point of admission it is my clinical judgment that the patient will require inpatient hospital care spanning beyond 2 midnights from the point of admission due to high intensity of service, high risk for further deterioration and high frequency of surveillance required.*  Author: Floydene Flock, MD 10/01/2022 6:07 PM  For on call review [email or web address].

## 2022-10-01 NOTE — Assessment & Plan Note (Addendum)
Placed on doxycycline by admitting physician.  Will discontinue upon discharge.

## 2022-10-01 NOTE — Assessment & Plan Note (Signed)
Continue statin. 

## 2022-10-01 NOTE — Assessment & Plan Note (Signed)
SSI A1c 

## 2022-10-01 NOTE — Assessment & Plan Note (Addendum)
Followed by Dr. Cathie Hoops outpatient.  Receiving Keytruda as outpatient. 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

## 2022-10-01 NOTE — Assessment & Plan Note (Signed)
Troponin 110s to 130s in the setting of concern for CVA No active chest pain EKG normal sinus rhythm right bundle branch block Suspect secondary demand ischemia Deferring antiplatelet regimen out of concern for hemorrhagic changes on CT imaging of the brain Will trend troponin overnight Consider cardiology consult as appropriate

## 2022-10-01 NOTE — ED Notes (Signed)
Pt urinated on himself, asked to get into gown. Pt refused @this  time, Pt is eating food. Will reassess

## 2022-10-01 NOTE — ED Provider Notes (Signed)
[facility] Provider Note    Event Date/Time   First MD Initiated Contact with Patient 10/01/22 1331     (approximate)   History   Weakness   HPI  Micheal Donovan is a 72 y.o. male past medical history significant for metastatic lung cancer status post chemotherapy on Keytruda, history of pulmonary embolism now off Eliquis secondary to intraparenchymal hemorrhage of the brain, who presents to the emergency department with weakness.  Patient started having weakness last night and has significantly worsened today.  Wife stated that he had a fall off of the commode and was difficult to get him off the floor and it took approximately 2 hours.  Today was unable to get him out of bed secondary to generalized weakness.  Has a history of a prior intraparenchymal hemorrhage and CVA and has had left-sided weakness in the past and has been seeing PT for it.  States that he normally ambulates with a walker.  Denies any chest pain or shortness of breath.  Believes that he is having increase of urinary frequency but denies any dysuria.  No neck pain or back pain.  No lower extremity numbness or tingling sensation.  No nausea, vomiting or blood in his stool.  No longer on anticoagulation from his prior intraparenchymal hemorrhage.  Patient has been on Keytruda for the past 1 year.  Denies any chest pain at this time.     Physical Exam   Triage Vital Signs: ED Triage Vitals  Encounter Vitals Group     BP 10/01/22 1259 98/61     Systolic BP Percentile --      Diastolic BP Percentile --      Pulse Rate 10/01/22 1259 96     Resp 10/01/22 1259 20     Temp 10/01/22 1259 98.2 F (36.8 C)     Temp src --      SpO2 10/01/22 1259 100 %     Weight 10/01/22 1300 132 lb (59.9 kg)     Height 10/01/22 1300 5\' 7"  (1.702 m)     Head Circumference --      Peak Flow --      Pain Score 10/01/22 1300 0     Pain Loc --      Pain Education --      Exclude from Growth Chart --     Most  recent vital signs: Vitals:   10/01/22 1259 10/01/22 1340  BP: 98/61 127/64  Pulse: 96 78  Resp: 20 18  Temp: 98.2 F (36.8 C)   SpO2: 100% 100%    Physical Exam Constitutional:      Appearance: He is well-developed.  HENT:     Head: Atraumatic.  Eyes:     Conjunctiva/sclera: Conjunctivae normal.  Cardiovascular:     Rate and Rhythm: Regular rhythm.  Pulmonary:     Effort: No respiratory distress.     Breath sounds: Rales present.  Musculoskeletal:     Cervical back: Normal range of motion.     Right lower leg: No edema.     Left lower leg: No edema.  Skin:    General: Skin is warm.  Neurological:     Mental Status: He is alert. Mental status is at baseline.     GCS: GCS eye subscore is 4. GCS verbal subscore is 5. GCS motor subscore is 6.     Cranial Nerves: Cranial nerves 2-12 are intact.     Sensory: Sensation is intact.  Comments: Able to hold the right lower extremity up to gravity.  Unable to hold left lower extremity up to gravity but family member states that this would be normal for him.     IMPRESSION / MDM / ASSESSMENT AND PLAN / ED COURSE  I reviewed the triage vital signs and the nursing notes.  Differential diagnosis including anemia, ACS, pneumonia, urinary tract infection, dehydration, medication side effect, pulmonary embolism.  High risk for PE given his history and no longer on anticoagulation with active known cancer.  EKG  I, Corena Herter, the attending physician, personally viewed and interpreted this ECG.   Rate: Normal  Rhythm: Normal sinus  Axis: Normal  Intervals: QTc 504.  Underlying right bundle branch block  ST&T Change: None No significant change when compared to prior EKG on 03/25/2022  No tachycardic or bradycardic dysrhythmias while on cardiac telemetry.  RADIOLOGY my interpretation of imaging: CT scan of the head without contrast -on my evaluation concern for acute bleed.  Read as progression of bleed.  LABS (all labs  ordered are listed, but only abnormal results are displayed) Labs interpreted as -    Labs Reviewed  CBC - Abnormal; Notable for the following components:      Result Value   RBC 3.56 (*)    Hemoglobin 10.2 (*)    HCT 32.2 (*)    All other components within normal limits  COMPREHENSIVE METABOLIC PANEL - Abnormal; Notable for the following components:   Sodium 130 (*)    CO2 20 (*)    BUN 24 (*)    Calcium 8.7 (*)    Albumin 3.2 (*)    All other components within normal limits  URINALYSIS, W/ REFLEX TO CULTURE (INFECTION SUSPECTED) - Abnormal; Notable for the following components:   Color, Urine YELLOW (*)    APPearance CLEAR (*)    Hgb urine dipstick SMALL (*)    Bacteria, UA RARE (*)    All other components within normal limits  TROPONIN I (HIGH SENSITIVITY) - Abnormal; Notable for the following components:   Troponin I (High Sensitivity) 137 (*)    All other components within normal limits  SARS CORONAVIRUS 2 BY RT PCR  TROPONIN I (HIGH SENSITIVITY)     MDM    Significantly elevated troponin 137.  No significant anemia.  Creatinine at baseline with no significant electrolyte abnormalities.  Mild hyponatremia 130 with a baseline of around 134.  No signs of urinary tract infection.  Added on CTA head and neck and CTA chest to evaluate for PE.  Consulted and discussed the patient's case with neurology Dr. Selina Cooley.     PROCEDURES:  Critical Care performed: yes  .Critical Care  Performed by: Corena Herter, MD Authorized by: Corena Herter, MD   Critical care provider statement:    Critical care time (minutes):  45   Critical care time was exclusive of:  Separately billable procedures and treating other patients   Critical care was necessary to treat or prevent imminent or life-threatening deterioration of the following conditions:  CNS failure or compromise   Critical care was time spent personally by me on the following activities:  Development of treatment plan  with patient or surrogate, discussions with consultants, evaluation of patient's response to treatment, examination of patient, ordering and review of laboratory studies, ordering and review of radiographic studies, ordering and performing treatments and interventions, pulse oximetry, re-evaluation of patient's condition and review of old charts   Patient's presentation is most consistent with  acute presentation with potential threat to life or bodily function.   MEDICATIONS ORDERED IN ED: Medications  sodium chloride 0.9 % bolus 1,000 mL (1,000 mLs Intravenous New Bag/Given 10/01/22 1422)  iohexol (OMNIPAQUE) 350 MG/ML injection 75 mL (125 mLs Intravenous Contrast Given 10/01/22 1539)    FINAL CLINICAL IMPRESSION(S) / ED DIAGNOSES   Final diagnoses:  Weakness     Rx / DC Orders   ED Discharge Orders     None        Note:  This document was prepared using Dragon voice recognition software and may include unintentional dictation errors.   Corena Herter, MD 10/01/22 1558

## 2022-10-02 ENCOUNTER — Inpatient Hospital Stay: Payer: PPO

## 2022-10-02 ENCOUNTER — Ambulatory Visit: Payer: PPO

## 2022-10-02 DIAGNOSIS — I5032 Chronic diastolic (congestive) heart failure: Secondary | ICD-10-CM

## 2022-10-02 DIAGNOSIS — I259 Chronic ischemic heart disease, unspecified: Secondary | ICD-10-CM

## 2022-10-02 DIAGNOSIS — E1169 Type 2 diabetes mellitus with other specified complication: Secondary | ICD-10-CM

## 2022-10-02 DIAGNOSIS — C3412 Malignant neoplasm of upper lobe, left bronchus or lung: Secondary | ICD-10-CM | POA: Diagnosis not present

## 2022-10-02 DIAGNOSIS — R569 Unspecified convulsions: Secondary | ICD-10-CM | POA: Diagnosis not present

## 2022-10-02 DIAGNOSIS — R531 Weakness: Secondary | ICD-10-CM | POA: Diagnosis not present

## 2022-10-02 DIAGNOSIS — I6523 Occlusion and stenosis of bilateral carotid arteries: Secondary | ICD-10-CM

## 2022-10-02 DIAGNOSIS — I952 Hypotension due to drugs: Secondary | ICD-10-CM

## 2022-10-02 LAB — GLUCOSE, CAPILLARY
Glucose-Capillary: 111 mg/dL — ABNORMAL HIGH (ref 70–99)
Glucose-Capillary: 144 mg/dL — ABNORMAL HIGH (ref 70–99)
Glucose-Capillary: 148 mg/dL — ABNORMAL HIGH (ref 70–99)
Glucose-Capillary: 116 mg/dL — ABNORMAL HIGH (ref 70–99)

## 2022-10-02 MED ORDER — PANTOPRAZOLE SODIUM 40 MG PO TBEC
40.0000 mg | DELAYED_RELEASE_TABLET | Freq: Every day | ORAL | Status: DC
  Administered 2022-10-02 – 2022-10-06 (×5): 40 mg via ORAL
  Filled 2022-10-02 (×5): qty 1

## 2022-10-02 MED ORDER — OXYCODONE-ACETAMINOPHEN 7.5-325 MG PO TABS
1.0000 | ORAL_TABLET | ORAL | Status: DC | PRN
  Administered 2022-10-03: 1 via ORAL
  Filled 2022-10-02: qty 1

## 2022-10-02 MED ORDER — IRBESARTAN 150 MG PO TABS
150.0000 mg | ORAL_TABLET | Freq: Every day | ORAL | Status: DC
  Administered 2022-10-02: 150 mg via ORAL
  Filled 2022-10-02: qty 1

## 2022-10-02 MED ORDER — POTASSIUM CHLORIDE CRYS ER 10 MEQ PO TBCR
10.0000 meq | EXTENDED_RELEASE_TABLET | Freq: Every day | ORAL | Status: DC
  Administered 2022-10-02 – 2022-10-06 (×5): 10 meq via ORAL
  Filled 2022-10-02 (×5): qty 1

## 2022-10-02 MED ORDER — ATORVASTATIN CALCIUM 20 MG PO TABS
40.0000 mg | ORAL_TABLET | Freq: Every day | ORAL | Status: DC
  Administered 2022-10-02: 40 mg via ORAL
  Filled 2022-10-02: qty 2

## 2022-10-02 MED ORDER — FENTANYL 75 MCG/HR TD PT72
1.0000 | MEDICATED_PATCH | TRANSDERMAL | Status: DC
  Administered 2022-10-02 – 2022-10-05 (×2): 1 via TRANSDERMAL
  Filled 2022-10-02 (×2): qty 1

## 2022-10-02 MED ORDER — FUROSEMIDE 20 MG PO TABS: 20.0000 mg | ORAL_TABLET | Freq: Every day | ORAL | Status: DC | PRN

## 2022-10-02 MED ORDER — LEVETIRACETAM IN NACL 500 MG/100ML IV SOLN
500.0000 mg | Freq: Two times a day (BID) | INTRAVENOUS | Status: DC
  Administered 2022-10-03: 500 mg via INTRAVENOUS
  Filled 2022-10-02 (×2): qty 100

## 2022-10-02 MED ORDER — SODIUM CHLORIDE 0.9 % IV BOLUS
500.0000 mL | Freq: Once | INTRAVENOUS | Status: AC
  Administered 2022-10-02: 500 mL via INTRAVENOUS

## 2022-10-02 MED ORDER — TRAZODONE HCL 50 MG PO TABS: 50.0000 mg | ORAL_TABLET | Freq: Every evening | ORAL | Status: DC | PRN

## 2022-10-02 MED ORDER — GABAPENTIN 300 MG PO CAPS
300.0000 mg | ORAL_CAPSULE | Freq: Three times a day (TID) | ORAL | Status: DC
  Administered 2022-10-02 – 2022-10-06 (×14): 300 mg via ORAL
  Filled 2022-10-02 (×14): qty 1

## 2022-10-02 MED ORDER — ASPIRIN 81 MG PO TBEC
81.0000 mg | DELAYED_RELEASE_TABLET | Freq: Every day | ORAL | Status: DC
  Administered 2022-10-02 – 2022-10-06 (×5): 81 mg via ORAL
  Filled 2022-10-02 (×5): qty 1

## 2022-10-02 MED ORDER — TAMSULOSIN HCL 0.4 MG PO CAPS
0.4000 mg | ORAL_CAPSULE | Freq: Every day | ORAL | Status: DC
  Administered 2022-10-02 – 2022-10-03 (×2): 0.4 mg via ORAL
  Filled 2022-10-02 (×2): qty 1

## 2022-10-02 MED ORDER — LEVETIRACETAM IN NACL 1500 MG/100ML IV SOLN
1500.0000 mg | Freq: Once | INTRAVENOUS | Status: AC
  Administered 2022-10-02: 1500 mg via INTRAVENOUS
  Filled 2022-10-02: qty 100

## 2022-10-02 NOTE — Assessment & Plan Note (Addendum)
Likely demand ischemia with stroke workup.  Recent echo showed a normal EF.  No complaints of chest pain.  NSTEMI ruled out.

## 2022-10-02 NOTE — Progress Notes (Signed)
Progress Note   Patient: Micheal Donovan XBJ:[id number] DOB: 1950-06-19 DOA: 10/01/2022     1 DOS: the patient was seen and examined on 10/02/2022   Brief hospital course: 72 y.o. male with medical history significant of multiple medical issues including stage IV lung cancer with mets to the bone, history of cerebral microhemorrhage, history of PE, type 2 diabetes, hypertension, lumbar radiculopathy, BPH, hyperlipidemia presenting with left lower extremity weakness and intermittent aphasia.  History from patient as well as his family.  Per report, patient with acute on chronic left lower extremity weakness.  Weakness has been more predominant over the past 2 weeks.  Patient had significant episode of difficulty with ambulation over the past 24 hours.  Also with intermittent episodes of patient staring off as well as aphasia.  Noted to be followed by Dr. Cathie Hoops outpatient with oncology.  Recently completed course of chemotherapy-Keytruda.  No chest pain or shortness of breath.  No nausea or vomiting.  Weakness appears to be chronic issue.  No reported headache, vision changes. Presented to the ER afebrile, hemodynamically stable.  White count 4.9, hemoglobin 10.2, creatinine 123, sodium 130.  Troponin 1 37-1 17.  CT imaging with evolving hemorrhage of left cerebral hemisphere.  Frothy secretions in the right sphenoid sinus concerning for sinusitis.  CT angio chest negative for PE. Noted Progressive bilateral proximal cervical ICA stenoses, 75% or greater on the left and 60% on the right, moderate to severe stenosis of the left vertebral artery origin and severe multifocal stenoses and/or segmental occlusions of the left V4 segment. EKG NSR w/ RBBB.   8/1.  Patient feels total body weakness worse with the left leg.  Neurology starting Keppra and ordering an EEG.  Physical therapy recommends rehab.  Assessment and Plan: * Weakness Noted worsening baseline weakness of left lower extremity in addition to mild  confusion and aphasia Baseline history of hemorrhagic CVA, lung cancer with metastatic disease Case discussed with Dr. Selina Cooley with neurology Physical therapy recommending rehab  Seizure [facility]) Neurology starting Keppra and ordering an EEG.  Primary non-small cell carcinoma of upper lobe of left lung (HCC) Followed by Dr. Cathie Hoops outpatient.  Receiving Keytruda as outpatient. 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   Sinusitis Placed on doxycycline by admitting physician  Myocardial ischemia Likely demand ischemia with stroke workup.  Recent echo showed a normal EF.  No complaints of chest pain.  NSTEMI ruled out.  (HFpEF) heart failure with preserved ejection fraction [facility]) 2D echo April 2024 with EF of 55 to 60% and grade 1 diastolic dysfunction Chronic in nature.  On Lasix as needed.  Hypotension After being given hypertensive medication.  Hold hypertensive medication and give a fluid bolus.  Pulmonary embolus (HCC) Prior history of pulmonary embolism.  Not on anticoagulation secondary to brain bleed.  Type 2 diabetes mellitus with hyperlipidemia (HCC) Hemoglobin A1c 6.6.  Sliding scale insulin.  Holding Lipitor.  Type 2 diabetes mellitus with hyperglycemia (HCC) Hemoglobin A1c 6.6.  On sliding scale insulin.  Carotid stenosis, bilateral Noted progressive bilateral proximal cervical ICA stenoses, 75% or greater on the left and 60% on the right. Moderate to severe stenosis of the left vertebral artery origin and severe multifocal stenoses and/or segmental occlusions of the left V4 segment noted on imaging.     HYPERCHOLESTEROLEMIA Hold Lipitor just in case contributing to weakness.  Subjective: Patient complains of left leg weakness.  Patient's wife states it has been total body weakness having trouble taking care of him at home since  he is unable to help and walk.  Neurology starting Keppra and ordering an EEG for potential seizure.  Physical Exam: Vitals:   10/02/22 0546 10/02/22 0801 10/02/22 1216 10/02/22 1351  BP: (!) 153/85 114/63 (!) 81/49 (!) 106/91  Pulse: 90 72 82 80  Resp: 18 16 18 16   Temp: 97.9 F (36.6 C) 97.7 F (36.5 C) 98.7 F (37.1 C) 97.6 F (36.4 C)  TempSrc: Oral Oral Oral Oral  SpO2: 100% 91% 100% 98%  Weight:      Height:       Physical Exam HENT:     Head: Normocephalic.     Mouth/Throat:     Pharynx: No oropharyngeal exudate.  Eyes:     General: Lids are normal.     Conjunctiva/sclera: Conjunctivae normal.  Cardiovascular:     Rate and Rhythm: Normal rate and regular rhythm.     Heart sounds: Normal heart sounds, S1 normal and S2 normal.  Pulmonary:     Breath sounds: No decreased breath sounds, wheezing, rhonchi or rales.  Abdominal:     Palpations: Abdomen is soft.     Tenderness: There is no abdominal tenderness.  Musculoskeletal:     Right lower leg: No swelling.     Left lower leg: No swelling.  Skin:    General: Skin is warm.     Findings: No rash.  Neurological:     Mental Status: He is alert.     Comments: Answers all questions appropriately.  Power 4+ out of 5 upper and lower extremities bilaterally.     Data Reviewed: MRI of the brain showed no acute intracranial abnormality, chronic left cerebellar hemorrhage with multiple additional chronic microhemorrhages throughout the brain MRI of the lumbar spine showed no evidence of acute infection or other abnormality within the lumbar spine.  Few subcentimeter T1 hypointense lesions visualized posterior iliac wings correspond to small sclerotic foci seen on the CT scan and not hypermetabolic on previous PET scan.  No evidence of metastatic disease within the lumbar spine.  Chronic L2 compression fracture stable.  Chronic multifactorial degenerative changes. Hemoglobin A1c 6.6, LDL 88, troponin 90, creatinine 1.23, sodium  130, hemoglobin 10.2  Family Communication:   Disposition: Status is: Inpatient Remains inpatient appropriate because: Neurology starting Keppra and getting an EEG for potential seizure  Planned Discharge Destination: Rehab    Time spent: 28 minutes Case discussed with neurology  Author: Alford Highland, MD 10/02/2022 3:20 PM  For on call review [email or web address].

## 2022-10-02 NOTE — Evaluation (Addendum)
Speech Language Pathology Evaluation Patient Details Name: Micheal Donovan MRN: [id number] DOB: 12/14/1950 Today's Date: 10/02/2022 Time: 4696-2952 SLP Time Calculation (min) (ACUTE ONLY): 20 min  Problem List:  Patient Active Problem List   Diagnosis Date Noted   CVA (cerebral vascular accident) (HCC) 10/01/2022   Weakness 10/01/2022   NSTEMI (non-ST elevated myocardial infarction) (HCC) 10/01/2022   (HFpEF) heart failure with preserved ejection fraction (HCC) 10/01/2022   Sinusitis 10/01/2022   Neuropathy 09/08/2022   Hypokalemia 07/29/2022   Weight loss 07/07/2022   Weakness of left lower extremity 05/26/2022   Encounter for antineoplastic immunotherapy 05/05/2022   Hypotension 04/05/2022   Elevated serum creatinine 03/18/2022   Chemotherapy induced neutropenia (HCC) 02/13/2022   Thrombocytosis 02/13/2022   Swelling of lower extremity 02/13/2022   Rhinovirus infection 11/20/2021   Hypophosphatemia 11/20/2021   Symptomatic anemia    Enteritis    Neutropenic fever (HCC) 11/18/2021   Essential hypertension 11/18/2021   Dyslipidemia 11/18/2021   Lower abdominal pain 11/18/2021   Frequent urination 11/11/2021   B12 deficiency anemia 09/27/2021   Back pain 08/16/2021   Folate deficiency anemia 08/16/2021   Metastasis to bone (HCC) 08/06/2021   Goals of care, counseling/discussion 07/23/2021   Pulmonary embolus (HCC) 07/23/2021   Encounter for antineoplastic chemotherapy 07/17/2021   Primary non-small cell carcinoma of upper lobe of left lung (HCC) 07/16/2021   Hemoptysis 07/16/2021   Diabetes mellitus (HCC) 07/02/2021   Pressure injury of skin 06/21/2021   Headache 06/13/2021   Hyperlipidemia 06/12/2021   CAP (community acquired pneumonia) 06/12/2021   Cerebellar bleed (HCC)    Intraparenchymal hemorrhage of brain (HCC) 06/11/2021   Type 2 diabetes mellitus with hyperglycemia (HCC) 06/11/2021   Pseudohyponatremia 06/11/2021   Dehydration 06/11/2021   Leukocytosis  06/11/2021   Hypercalcemia 06/11/2021   Lumbar radiculopathy 03/15/2021   Lumbar pain 01/10/2020   Carotid stenosis, bilateral 11/07/2011   Type 2 diabetes mellitus without complications (HCC) 02/07/2010   HYPERCHOLESTEROLEMIA 02/07/2010   Benign hypertension 02/07/2010   CAROTID BRUIT, LEFT 02/07/2010   CHEST PAIN 02/07/2010   Past Medical History:  Past Medical History:  Diagnosis Date   Allergy    BPH (benign prostatic hypertrophy)    Cancer (HCC)    Melanoma on Neck    2008   Carotid artery occlusion    CHF (congestive heart failure) (HCC)    Diabetes mellitus    type 2   ED (erectile dysfunction)    GERD (gastroesophageal reflux disease)    Hemorrhagic stroke (HCC) 06/2021   Hyperlipidemia    Hypertension    Lung cancer (HCC)    Neoplasm related pain    Retinopathy due to secondary DM [facility])    Past Surgical History:  Past Surgical History:  Procedure Laterality Date   BRONCHIAL NEEDLE ASPIRATION BIOPSY  06/17/2021   Procedure: BRONCHIAL NEEDLE ASPIRATION BIOPSIES;  Surgeon: Lorin Glass, MD;  Location: [facility] ENDOSCOPY;  Service: Pulmonary;;   IR IMAGING GUIDED PORT INSERTION  08/05/2021   MELANOMA EXCISION  2008   Left side of neck   RADIOLOGY WITH ANESTHESIA N/A 04/05/2020   Procedure: MRI SPINE WITOUT CONTRAST;  Surgeon: Radiologist, Medication, MD;  Location: [facility] OR;  Service: Radiology;  Laterality: N/A;   RADIOLOGY WITH ANESTHESIA N/A 08/22/2021   Procedure: MRI BRAIN WITH AND WITHOUT CONTRAST  WITH ANESTHESIA;  Surgeon: Radiologist, Medication, MD;  Location: [facility] OR;  Service: Radiology;  Laterality: N/A;   RADIOLOGY WITH ANESTHESIA N/A 12/17/2021   Procedure: MRI BRAIN WITH AND  WITHOUT CONTRAST WITH ANESTHESIA; MRI LUMBER WITH AND WITHOUT CONTRAST;  Surgeon: Radiologist, Medication, MD;  Location: [facility] OR;  Service: Radiology;  Laterality: N/A;   RADIOLOGY WITH ANESTHESIA N/A 04/08/2022   Procedure: MRI LUMBER SPINE WITH AND WITHOUT CONTRAST;  Surgeon: Radiologist,  Medication, MD;  Location: [facility] OR;  Service: Radiology;  Laterality: N/A;   TONSILLECTOMY     VIDEO BRONCHOSCOPY WITH ENDOBRONCHIAL ULTRASOUND N/A 06/17/2021   Procedure: VIDEO BRONCHOSCOPY WITH ENDOBRONCHIAL ULTRASOUND;  Surgeon: Lorin Glass, MD;  Location[facility] ENDOSCOPY;  Service: Pulmonary;  Laterality: N/A;   HPI:  Pt is  72 y.o. male with who presented [facility] ED on 7/31 with weakness, difficulty with ambulation, and intermittent episodes of staring off and aphadia. Stroke work up initiated. Pt with medical history significant of multiple medical issues including stage IV lung cancer with mets to the bone, history of cerebral microhemorrhage, history of PE, type 2 diabetes, hypertension, lumbar radiculopathy, BPH, hyperlipidemia presenting with left lower extremity weakness and intermittent aphasia. CT imaging with evolving hemorrhage of left cerebral hemisphere.  Frothy secretions in the right sphenoid sinus concerning for sinusitis.  CT angio chest negative for PE. Noted Progressive bilateral proximal cervical ICA stenoses, 75% or greater on the left and 60% on the right, moderate to severe stenosis of the left vertebral artery origin and severe multifocal stenoses and/or segmental occlusions of the left V4 segment. EKG NSR w/ RBBB.   Assessment / Plan / Recommendation Clinical Impression  Pt seen for speech/language evaluation. Pt lethargic, slow to respond. Flat affect. ?HOH. Wife present at end of evaluation. Assessment completed via informal means. Pt presents with speech/language deficits affecting complex auditory comprehension (2-step commands, complex yes/no) and verbal fluency (divergent naming). Speech is fluent and without s/sx anomia; however, latency appreciated. Suspect lethargy, medication (given Xanax for MRI - per pt), and hearing status affecting performance on today's assessment. Further dynamic and diagnostic tx warranted.    SLP Assessment  SLP Recommendation/Assessment:  Patient needs continued Speech Lanaguage Pathology Services SLP Visit Diagnosis: Cognitive communication deficit (R41.841)    Recommendations for follow up therapy are one component of a multi-disciplinary discharge planning process, led by the attending physician.  Recommendations may be updated based on patient status, additional functional criteria and insurance authorization.    Follow Up Recommendations   (in line with OT/PT recommendations)    Assistance Recommended at Discharge  Intermittent Supervision/Assistance  Functional Status Assessment Patient has had a recent decline in their functional status and demonstrates the ability to make significant improvements in function in a reasonable and predictable amount of time.  Frequency and Duration min 2x/week  1 week      SLP Evaluation Cognition  Overall Cognitive Status: Impaired/Different from baseline Arousal/Alertness: Lethargic Orientation Level: Oriented X4 Comments: slow to respond; further evaluation warranted       Comprehension  Auditory Comprehension Overall Auditory Comprehension: Impaired Yes/No Questions: Impaired Complex Questions: 50-74% accurate Commands: Impaired Complex Commands: 75-100% accurate Interfering Components: Hearing;Processing speed EffectiveTechniques: Extra processing time;Repetition;Increased volume Visual Recognition/Discrimination Discrimination: Within Function Limits Reading Comprehension Reading Status:  (oral reading WFL)    Expression Expression Primary Mode of Expression: Verbal Verbal Expression Overall Verbal Expression: Impaired Initiation: Impaired (slow to respond at times) Automatic Speech: Name;Social Response Level of Generative/Spontaneous Verbalization: Phrase;Sentence Repetition: No impairment Naming: Impairment Confrontation: Within functional limits Divergent:  (7 animals in 60s) Pragmatics: Impairment Impairments: Abnormal affect (flat affect) Written  Expression Dominant Hand: Right Written Expression: Not tested   Oral /  Motor  Oral Motor/Sensory Function Overall Oral Motor/Sensory Function: Within functional limits (appeared functional) Motor Speech Overall Motor Speech: Appears within functional limits for tasks assessed Respiration: Within functional limits Phonation: Normal Resonance: Within functional limits Articulation: Within functional limitis Intelligibility: Intelligible Motor Planning: Witnin functional limits Motor Speech Errors: Not applicable           Clyde Canterbury, M.S., CCC-SLP Speech-Language Pathologist [facility] [phone] Arnette Felts)  Woodroe Chen 10/02/2022, 9:19 AM

## 2022-10-02 NOTE — TOC Initial Note (Signed)
Transition of Care [facility]) - Initial/Assessment Note    Patient Details  Name: Micheal Donovan MRN: [id number] Date of Birth: 02-07-1951  Transition of Care [facility]) CM/SW Contact:    Allena Katz, LCSW Phone Number: 10/02/2022, 1:20 PM  Clinical Narrative:   CSW spoke with pt regarding SNF. Pt is agreeable and reports he would like referrals sent out near his house and will choose a bed offer once one comes in. Pt lives at home with wife and has a rollator and a wheelchair. Pt reports no home concerns or needs at this time and states he is active with primary care at Dr. Felisa Bonier office.                       Patient Goals and CMS Choice            Expected Discharge Plan and Services                                              Prior Living Arrangements/Services                       Activities of Daily Living Home Assistive Devices/Equipment: Dan Humphreys (specify type) ADL Screening (condition at time of admission) Patient's cognitive ability adequate to safely complete daily activities?: Yes Is the patient deaf or have difficulty hearing?: No Does the patient have difficulty seeing, even when wearing glasses/contacts?: No Does the patient have difficulty concentrating, remembering, or making decisions?: No Patient able to express need for assistance with ADLs?: Yes Does the patient have difficulty dressing or bathing?: Yes Independently performs ADLs?: No Communication: Independent Dressing (OT): Needs assistance Is this a change from baseline?: Change from baseline, expected to last <3days Grooming: Needs assistance Is this a change from baseline?: Change from baseline, expected to last <3 days Feeding: Independent Bathing: Needs assistance Is this a change from baseline?: Change from baseline, expected to last <3 days Toileting: Needs assistance Is this a change from baseline?: Change from baseline, expected to last <3 days In/Out Bed: Needs  assistance Is this a change from baseline?: Change from baseline, expected to last <3 days Walks in Home: Needs assistance Is this a change from baseline?: Pre-admission baseline Does the patient have difficulty walking or climbing stairs?: Yes Weakness of Legs: Left Weakness of Arms/Hands: None  Permission Sought/Granted                  Emotional Assessment              Admission diagnosis:  Weakness [R53.1] CVA (cerebral vascular accident) [facility]) [I63.9] Intraparenchymal hemorrhage of brain [facility]) [I61.9] Patient Active Problem List   Diagnosis Date Noted   CVA (cerebral vascular accident) (HCC) 10/01/2022   Weakness 10/01/2022   NSTEMI (non-ST elevated myocardial infarction) (HCC) 10/01/2022   (HFpEF) heart failure with preserved ejection fraction (HCC) 10/01/2022   Sinusitis 10/01/2022   Neuropathy 09/08/2022   Hypokalemia 07/29/2022   Weight loss 07/07/2022   Weakness of left lower extremity 05/26/2022   Encounter for antineoplastic immunotherapy 05/05/2022   Hypotension 04/05/2022   Elevated serum creatinine 03/18/2022   Chemotherapy induced neutropenia (HCC) 02/13/2022   Thrombocytosis 02/13/2022   Swelling of lower extremity 02/13/2022   Rhinovirus infection 11/20/2021   Hypophosphatemia 11/20/2021   Symptomatic anemia    Enteritis  Neutropenic fever (HCC) 11/18/2021   Essential hypertension 11/18/2021   Dyslipidemia 11/18/2021   Lower abdominal pain 11/18/2021   Frequent urination 11/11/2021   B12 deficiency anemia 09/27/2021   Back pain 08/16/2021   Folate deficiency anemia 08/16/2021   Metastasis to bone (HCC) 08/06/2021   Goals of care, counseling/discussion 07/23/2021   Pulmonary embolus (HCC) 07/23/2021   Encounter for antineoplastic chemotherapy 07/17/2021   Primary non-small cell carcinoma of upper lobe of left lung (HCC) 07/16/2021   Hemoptysis 07/16/2021   Diabetes mellitus (HCC) 07/02/2021   Pressure injury of skin 06/21/2021    Headache 06/13/2021   Hyperlipidemia 06/12/2021   CAP (community acquired pneumonia) 06/12/2021   Cerebellar bleed (HCC)    Intraparenchymal hemorrhage of brain (HCC) 06/11/2021   Type 2 diabetes mellitus with hyperglycemia (HCC) 06/11/2021   Pseudohyponatremia 06/11/2021   Dehydration 06/11/2021   Leukocytosis 06/11/2021   Hypercalcemia 06/11/2021   Lumbar radiculopathy 03/15/2021   Lumbar pain 01/10/2020   Carotid stenosis, bilateral 11/07/2011   Type 2 diabetes mellitus without complications (HCC) 02/07/2010   HYPERCHOLESTEROLEMIA 02/07/2010   Benign hypertension 02/07/2010   CAROTID BRUIT, LEFT 02/07/2010   CHEST PAIN 02/07/2010   PCP:  Donita Brooks, MD Pharmacy:   [facility] [phone] - [place], [place] - [address] [place] [address] Phone: [phone] Fax: [phone]     Social Determinants of Health (SDOH) Social History: SDOH Screenings   Food Insecurity: No Food Insecurity (10/01/2022)  Housing: Low Risk  (10/01/2022)  Transportation Needs: No Transportation Needs (10/01/2022)  Utilities: Not At Risk (10/01/2022)  Alcohol Screen: Low Risk  (03/27/2022)  Depression (PHQ2-9): High Risk (04/21/2022)  Financial Resource Strain: Low Risk  (04/17/2022)  Physical Activity: Inactive (03/27/2022)  Social Connections: Moderately Isolated (03/27/2022)  Stress: Stress Concern Present (03/27/2022)  Tobacco Use: Medium Risk (10/01/2022)   SDOH Interventions:     Readmission Risk Interventions    11/20/2021   10:17 AM  Readmission Risk Prevention Plan  Transportation Screening Complete  PCP or Specialist Appt within 3-5 Days Complete  HRI or Home Care Consult --  Social Work Consult for Recovery Care Planning/Counseling --  Palliative Care Screening Not Applicable  Medication Review [employer]) Complete

## 2022-10-02 NOTE — Assessment & Plan Note (Addendum)
Fluid bolus started today.  TED hose.  Started midodrine 10 mg 3 times daily.  Patient received a steroid injection last week so I cannot order a a.m. cortisol at this point.  Continue working with physical therapy.  IV fluid hydration.  Hold Flomax and irbesartan.

## 2022-10-02 NOTE — Evaluation (Addendum)
Physical Therapy Evaluation Patient Details Name: Micheal Donovan MRN: [id number] DOB: [birth year] Today's Date: 10/02/2022  History of Present Illness  Pt is a 72 year old male presenting with LLE weakness and aphasia; imaging showed likely chronic hemorrhagic changes with calcification. Pmh significant for stage IV lung cancer with mets to the bone, history of cerebral microhemorrhage, history of PE, type 2 diabetes, hypertension, lumbar radiculopathy, BPH, hyperlipidemia, Chronic L2 compression fracture, stable  Clinical Impression   Pt presents with his wife, A&Ox3, disoriented to situation. He currently lives with his wife in a single story house with a ramp to enter. Per his wife, over the last few days he was using a wheelchair to mobilize due to increased weakness and required more assistance than normal for dressing and bathing. Prior to this, he was ambulating with RW and his wife assisted with dressing and bathing.   Pt exhibits a flat affect and requires increased processing time throughout treatment. He was able to perform supine>sit with minA, requiring increased time and assistance to bring LE off EOB as well as task completion. Pt able to maintain upright sitting posture initially, but had a posterior lean after a few minutes that was corrected with cueing. LE alternate foot tapping exhibited decreased accuracy with increased speed. Pt able to perform sit<>stand and transfer to the recliner with minA and verbal cueing for sequencing. Pt verbalized movement today is more difficult than normal. Pt would benefit from continued skilled therapy to maximize functional abilities.         If plan is discharge home, recommend the following: A little help with walking and/or transfers;A little help with bathing/dressing/bathroom;Assistance with cooking/housework;Assist for transportation;Help with stairs or ramp for entrance;Direct supervision/assist for medications management;Direct  supervision/assist for financial management   Can travel by private vehicle   No    Equipment Recommendations None recommended by PT  Recommendations for Other Services       Functional Status Assessment Patient has had a recent decline in their functional status and demonstrates the ability to make significant improvements in function in a reasonable and predictable amount of time.     Precautions / Restrictions Precautions Precautions: Fall Restrictions Weight Bearing Restrictions: No      Mobility  Bed Mobility Overal bed mobility: Needs Assistance Bed Mobility: Supine to Sit     Supine to sit: Min assist     General bed mobility comments: Assist for completion of task, required increased time for rolling supine>sidelying and bringing LE off EOB    Transfers Overall transfer level: Needs assistance Equipment used: Rolling walker (2 wheels) Transfers: Sit to/from Stand, Bed to chair/wheelchair/BSC Sit to Stand: Min assist                Ambulation/Gait                  Stairs            Wheelchair Mobility     Tilt Bed    Modified Rankin (Stroke Patients Only)       Balance Overall balance assessment: Needs assistance Sitting-balance support: Feet supported, Bilateral upper extremity supported Sitting balance-Leahy Scale: Fair Sitting balance - Comments: Able to maintain upright positioning initially, but fatigued over a few minutes and began to lean back. Able to correct with cueing Postural control: Posterior lean Standing balance support: Bilateral upper extremity supported, During functional activity, Reliant on assistive device for balance Standing balance-Leahy Scale: Fair  Pertinent Vitals/Pain Pain Assessment Pain Assessment: No/denies pain    Home Living Family/patient expects to be discharged to:: Private residence Living Arrangements: Spouse/significant other Available Help  at Discharge: Family;Available 24 hours/day Type of Home: House Home Access: Ramped entrance       Home Layout: One level Home Equipment: Agricultural consultant (2 wheels);BSC/3in1;Shower seat - built in;Wheelchair - manual      Prior Function Prior Level of Function : Needs assist       Physical Assist : Mobility (physical);ADLs (physical) Mobility (physical): Transfers;Gait ADLs (physical): Bathing;Dressing Mobility Comments: Typically ambulates with RW but over the past few days has been using WC due to increased weakness. Pts wife has been assisting with transfers to the bathroom ADLs Comments: Over the last few days wife has been assisting with donning shoes/ socks and putting on pants. Per wife, if patient dressed independently it would take an hour. Wife has also been supervising for showers     Hand Dominance   Dominant Hand: Right    Extremity/Trunk Assessment   Upper Extremity Assessment Upper Extremity Assessment: Generalized weakness    Lower Extremity Assessment Lower Extremity Assessment: Generalized weakness;LLE deficits/detail LLE Deficits / Details: difficutly with against gravity movement while laying in bed, able to complete with increased time LLE Coordination: decreased gross motor (alternate toe tapping: unable to maintain accuracy with increased speed)    Cervical / Trunk Assessment Cervical / Trunk Assessment: Normal  Communication   Communication: No difficulties  Cognition Arousal/Alertness: Awake/alert Behavior During Therapy: Flat affect, WFL for tasks assessed/performed Overall Cognitive Status: Impaired/Different from baseline Area of Impairment: Orientation, Following commands, Problem solving                 Orientation Level: Disoriented to, Situation     Following Commands: Follows one step commands consistently, Follows one step commands with increased time     Problem Solving: Slow processing          General Comments  General comments (skin integrity, edema, etc.): verbal cueing throughout    Exercises     Assessment/Plan    PT Assessment Patient needs continued PT services  PT Problem List Decreased strength;Decreased range of motion;Decreased activity tolerance;Decreased balance;Decreased mobility;Decreased safety awareness;Decreased knowledge of use of DME;Decreased cognition       PT Treatment Interventions DME instruction;Therapeutic exercise;Gait training;Balance training;Stair training;Functional mobility training;Therapeutic activities;Patient/family education    PT Goals (Current goals can be found in the Care Plan section)  Acute Rehab PT Goals Patient Stated Goal: return home PT Goal Formulation: With patient Time For Goal Achievement: 10/16/22 Potential to Achieve Goals: Good    Frequency Min 1X/week     Co-evaluation               AM-PAC PT "6 Clicks" Mobility  Outcome Measure Help needed turning from your back to your side while in a flat bed without using bedrails?: A Little Help needed moving from lying on your back to sitting on the side of a flat bed without using bedrails?: A Little Help needed moving to and from a bed to a chair (including a wheelchair)?: A Little Help needed standing up from a chair using your arms (e.g., wheelchair or bedside chair)?: A Little Help needed to walk in hospital room?: A Little Help needed climbing 3-5 steps with a railing? : A Lot 6 Click Score: 17    End of Session Equipment Utilized During Treatment: Gait belt Activity Tolerance: Patient tolerated treatment well Patient left: in chair;with  family/visitor present;Other (comment) (OT in room)   PT Visit Diagnosis: Other abnormalities of gait and mobility (R26.89);Difficulty in walking, not elsewhere classified (R26.2);Muscle weakness (generalized) (M62.81)    Time: 4010-2725 PT Time Calculation (min) (ACUTE ONLY): 33 min   Charges:   PT Evaluation $PT Eval Low Complexity:  1 Low PT Treatments $Therapeutic Activity: 8-22 mins PT General Charges $$ ACUTE PT VISIT: 1 Visit       Zissel Biederman, PT, SPT 11:19 AM,10/02/22

## 2022-10-02 NOTE — Assessment & Plan Note (Signed)
Hemoglobin A1c 6.6.  Sliding scale insulin.  Holding Lipitor.

## 2022-10-02 NOTE — Plan of Care (Signed)

## 2022-10-02 NOTE — Plan of Care (Signed)
  Problem: Coping: Goal: Will verbalize positive feelings about self Outcome: Progressing Goal: Will identify appropriate support needs Outcome: Progressing   Problem: Nutrition: Goal: Risk of aspiration will decrease Outcome: Progressing Goal: Dietary intake will improve Outcome: Progressing   Problem: Fluid Volume: Goal: Ability to maintain a balanced intake and output will improve Outcome: Progressing   Problem: Nutritional: Goal: Maintenance of adequate nutrition will improve Outcome: Progressing Goal: Progress toward achieving an optimal weight will improve Outcome: Progressing   Problem: Skin Integrity: Goal: Risk for impaired skin integrity will decrease Outcome: Progressing

## 2022-10-02 NOTE — Evaluation (Signed)
Occupational Therapy Evaluation Patient Details Name: Micheal Donovan MRN: [id number] DOB: 1950/10/16 Today's Date: 10/02/2022   History of Present Illness Pt is a 72 year old male presenting with LLE weakness and aphasia; imaging showed likely chronic hemorrhagic changes with calcification. Pmh significant for stage IV lung cancer with mets to the bone, history of cerebral microhemorrhage, history of PE, type 2 diabetes, hypertension, lumbar radiculopathy, BPH, hyperlipidemia, Chronic L2 compression fracture, stable   Clinical Impression   Chart reviewed, pt greeted in chair, hand off from PT. Pt is alert and oriented x4, increased time required for processing throughout. PTA pt is generally MOD I in ADL, requires increased time, has required assist for all ADL, using a mwc this week which is below baseline per wife. Pt presents with deficits in strength, endurance, activity tolerance, balance, cognition all affecting safe and optimal ADL completion. Pt will benefit from acute and post acute OT to address functional deficits and to facilitate return to PLOF.      Recommendations for follow up therapy are one component of a multi-disciplinary discharge planning process, led by the attending physician.  Recommendations may be updated based on patient status, additional functional criteria and insurance authorization.   Assistance Recommended at Discharge Intermittent Supervision/Assistance  Patient can return home with the following A little help with walking and/or transfers;A little help with bathing/dressing/bathroom;Assistance with cooking/housework;Assist for transportation;Direct supervision/assist for medications management;Help with stairs or ramp for entrance;Direct supervision/assist for financial management    Functional Status Assessment  Patient has had a recent decline in their functional status and demonstrates the ability to make significant improvements in function in a reasonable and  predictable amount of time.  Equipment Recommendations  None recommended by OT (pt has recommended equipment at home)    Recommendations for Other Services       Precautions / Restrictions Precautions Precautions: Fall Restrictions Weight Bearing Restrictions: No      Mobility Bed Mobility               General bed mobility comments: NT in recliner, then on bsc at end of evaluation    Transfers Overall transfer level: Needs assistance Equipment used: Rolling walker (2 wheels) Transfers: Sit to/from Stand, Bed to chair/wheelchair/BSC Sit to Stand: Min guard     Step pivot transfers: Min guard            Balance Overall balance assessment: Needs assistance Sitting-balance support: Feet supported Sitting balance-Leahy Scale: Good     Standing balance support: Bilateral upper extremity supported, During functional activity, Reliant on assistive device for balance Standing balance-Leahy Scale: Fair                             ADL either performed or assessed with clinical judgement   ADL Overall ADL's : Needs assistance/impaired Eating/Feeding: Sitting;Set up               Upper Body Dressing : Minimal assistance Upper Body Dressing Details (indicate cue type and reason): anticipate Lower Body Dressing: Maximal assistance Lower Body Dressing Details (indicate cue type and reason): anticipate Toilet Transfer: Min Agricultural consultant Details (indicate cue type and reason): short amb transfer to bsc Toileting- Clothing Manipulation and Hygiene: Moderate assistance;Sitting/lateral lean               Vision Patient Visual Report: No change from baseline              Pertinent  Vitals/Pain Pain Assessment Pain Assessment: No/denies pain     Hand Dominance Right   Extremity/Trunk Assessment Upper Extremity Assessment Upper Extremity Assessment: Generalized weakness      Cervical / Trunk Assessment Cervical / Trunk  Assessment: Normal   Communication Communication Communication: No difficulties   Cognition Arousal/Alertness: Awake/alert Behavior During Therapy: WFL for tasks assessed/performed Overall Cognitive Status: Impaired/Different from baseline Area of Impairment: Awareness, Problem solving, Following commands                       Following Commands: Follows one step commands consistently   Awareness: Emergent Problem Solving: Slow processing, Requires verbal cues, Requires tactile cues       General Comments      Exercises Other Exercises Other Exercises: edu pt and wife re: role of OT, role of rehab, DME use at home(bed rails/hospital bed, bsc, shower seat, mwc)        Home Living Family/patient expects to be discharged to:: Private residence Living Arrangements: Spouse/significant other Available Help at Discharge: Family;Available 24 hours/day Type of Home: House Home Access: Ramped entrance     Home Layout: One level     Bathroom Shower/Tub: Producer, television/film/video: Handicapped height Bathroom Accessibility: Yes   Home Equipment: Agricultural consultant (2 wheels);BSC/3in1;Shower seat - built in;Wheelchair - manual      Lives With: Spouse    Prior Functioning/Environment Prior Level of Function : Needs assist       Physical Assist : Mobility (physical);ADLs (physical) Mobility (physical): Transfers;Gait ADLs (physical): Bathing;Dressing Mobility Comments: amb with RW, mwc the last week due to increased weakness ADLs Comments: generally MOD I for ADL with sig increased time, so wife will asssit with dressing, showering as needed; SET UP for feeding/grooming tasks as needed        OT Problem List: Decreased strength;Decreased activity tolerance;Decreased knowledge of use of DME or AE;Impaired balance (sitting and/or standing)      OT Treatment/Interventions: Self-care/ADL training;Balance training;Cognitive remediation/compensation;Therapeutic  exercise;DME and/or AE instruction;Therapeutic activities;Patient/family education    OT Goals(Current goals can be found in the care plan section) Acute Rehab OT Goals Patient Stated Goal: go home OT Goal Formulation: With patient/family Time For Goal Achievement: 10/16/22 Potential to Achieve Goals: Good ADL Goals Pt Will Perform Grooming: with modified independence Pt Will Perform Lower Body Dressing: with min assist Pt Will Transfer to Toilet: with min assist Pt Will Perform Toileting - Clothing Manipulation and hygiene: with min assist  OT Frequency: Min 1X/week       AM-PAC OT "6 Clicks" Daily Activity     Outcome Measure Help from another person eating meals?: None Help from another person taking care of personal grooming?: None Help from another person toileting, which includes using toliet, bedpan, or urinal?: A Little Help from another person bathing (including washing, rinsing, drying)?: A Little Help from another person to put on and taking off regular upper body clothing?: A Little Help from another person to put on and taking off regular lower body clothing?: A Lot 6 Click Score: 19   End of Session Equipment Utilized During Treatment: Rolling walker (2 wheels) Nurse Communication: Mobility status  Activity Tolerance: Patient tolerated treatment well Patient left: Other (comment) (on bsc with nurse present)  OT Visit Diagnosis: Other abnormalities of gait and mobility (R26.89);Unsteadiness on feet (R26.81);Muscle weakness (generalized) (M62.81)                Time: 1027-2536 OT Time Calculation (min):  20 min Charges:  OT General Charges $OT Visit: 1 Visit OT Evaluation $OT Eval Low Complexity: 1 Low  Oleta Mouse, OTD OTR/L  10/02/22, 11:57 AM

## 2022-10-02 NOTE — Progress Notes (Signed)
Eeg done 

## 2022-10-02 NOTE — Assessment & Plan Note (Addendum)
Neurology starting Keppra.  EEG negative but neurology still recommends continuing Keppra as outpatient.

## 2022-10-02 NOTE — [place] FL2 (Signed)
[place] MEDICAID FL2 LEVEL OF CARE FORM     IDENTIFICATION  Patient Name: Micheal Donovan Birthdate: Feb 13, 1951 Sex: male Admission Date (Current Location): 10/01/2022  [place] and [place] Number:  Chiropodist and Address:  [facility], [address] [place], [place] [address]      Provider Number: [id number]  Attending Physician Name and Address:  Alford Highland, MD  Relative Name and Phone Number:       Current Level of Care: Hospital Recommended Level of Care: Skilled Nursing Facility Prior Approval Number:    Date Approved/Denied:   PASRR Number: [id number]  Discharge Plan: SNF    Current Diagnoses: Patient Active Problem List   Diagnosis Date Noted   CVA (cerebral vascular accident) (HCC) 10/01/2022   Weakness 10/01/2022   NSTEMI (non-ST elevated myocardial infarction) (HCC) 10/01/2022   (HFpEF) heart failure with preserved ejection fraction (HCC) 10/01/2022   Sinusitis 10/01/2022   Neuropathy 09/08/2022   Hypokalemia 07/29/2022   Weight loss 07/07/2022   Weakness of left lower extremity 05/26/2022   Encounter for antineoplastic immunotherapy 05/05/2022   Hypotension 04/05/2022   Elevated serum creatinine 03/18/2022   Chemotherapy induced neutropenia (HCC) 02/13/2022   Thrombocytosis 02/13/2022   Swelling of lower extremity 02/13/2022   Rhinovirus infection 11/20/2021   Hypophosphatemia 11/20/2021   Symptomatic anemia    Enteritis    Neutropenic fever (HCC) 11/18/2021   Essential hypertension 11/18/2021   Dyslipidemia 11/18/2021   Lower abdominal pain 11/18/2021   Frequent urination 11/11/2021   B12 deficiency anemia 09/27/2021   Back pain 08/16/2021   Folate deficiency anemia 08/16/2021   Metastasis to bone (HCC) 08/06/2021   Goals of care, counseling/discussion 07/23/2021   Pulmonary embolus (HCC) 07/23/2021   Encounter for antineoplastic chemotherapy 07/17/2021   Primary non-small cell carcinoma of upper  lobe of left lung (HCC) 07/16/2021   Hemoptysis 07/16/2021   Diabetes mellitus (HCC) 07/02/2021   Pressure injury of skin 06/21/2021   Headache 06/13/2021   Hyperlipidemia 06/12/2021   CAP (community acquired pneumonia) 06/12/2021   Cerebellar bleed (HCC)    Intraparenchymal hemorrhage of brain (HCC) 06/11/2021   Type 2 diabetes mellitus with hyperglycemia (HCC) 06/11/2021   Pseudohyponatremia 06/11/2021   Dehydration 06/11/2021   Leukocytosis 06/11/2021   Hypercalcemia 06/11/2021   Lumbar radiculopathy 03/15/2021   Lumbar pain 01/10/2020   Carotid stenosis, bilateral 11/07/2011   Type 2 diabetes mellitus without complications (HCC) 02/07/2010   HYPERCHOLESTEROLEMIA 02/07/2010   Benign hypertension 02/07/2010   CAROTID BRUIT, LEFT 02/07/2010   CHEST PAIN 02/07/2010    Orientation RESPIRATION BLADDER Height & Weight     Self, Time, Situation, Place  Normal Continent Weight: 132 lb (59.9 kg) Height:  5\' 7"  (170.2 cm)  BEHAVIORAL SYMPTOMS/MOOD NEUROLOGICAL BOWEL NUTRITION STATUS           AMBULATORY STATUS COMMUNICATION OF NEEDS Skin   Extensive Assist   Normal                       Personal Care Assistance Level of Assistance  Bathing, Dressing, Feeding Bathing Assistance: Maximum assistance Feeding assistance: Limited assistance Dressing Assistance: Maximum assistance     Functional Limitations Info  Sight, Hearing, Speech Sight Info: Impaired Hearing Info: Adequate Speech Info: Adequate    SPECIAL CARE FACTORS FREQUENCY  PT (By licensed PT), OT (By licensed OT)     PT Frequency: 5 times a week OT Frequency: 5 times a week  Contractures Contractures Info: Not present    Additional Factors Info  Code Status, Allergies Code Status Info: FULL Allergies Info: niacin           Current Medications (10/02/2022):  This is the current hospital active medication list Current Facility-Administered Medications  Medication Dose Route Frequency  Provider Last Rate Last Admin   aspirin EC tablet 81 mg  81 mg Oral Daily Alford Highland, MD   81 mg at 10/02/22 0940   atorvastatin (LIPITOR) tablet 40 mg  40 mg Oral Daily Alford Highland, MD   40 mg at 10/02/22 0940   doxycycline (VIBRA-TABS) tablet 100 mg  100 mg Oral Q12H Floydene Flock, MD   100 mg at 10/02/22 0940   fentaNYL (DURAGESIC) 75 MCG/HR 1 patch  1 patch Transdermal Q72H Alford Highland, MD   1 patch at 10/02/22 1610   furosemide (LASIX) tablet 20 mg  20 mg Oral Daily PRN Alford Highland, MD       gabapentin (NEURONTIN) capsule 300 mg  300 mg Oral TID Alford Highland, MD   300 mg at 10/02/22 0940   insulin aspart (novoLOG) injection 0-9 Units  0-9 Units Subcutaneous TID WC Floydene Flock, MD   1 Units at 10/02/22 1226   oxyCODONE-acetaminophen (PERCOCET) 7.5-325 MG per tablet 1 tablet  1 tablet Oral Q4H PRN Alford Highland, MD       pantoprazole (PROTONIX) EC tablet 40 mg  40 mg Oral Daily Alford Highland, MD   40 mg at 10/02/22 0940   potassium chloride (KLOR-CON M) CR tablet 10 mEq  10 mEq Oral Daily Alford Highland, MD   10 mEq at 10/02/22 0940   tamsulosin (FLOMAX) capsule 0.4 mg  0.4 mg Oral QPC breakfast Alford Highland, MD   0.4 mg at 10/02/22 0940   traZODone (DESYREL) tablet 50 mg  50 mg Oral QHS PRN Alford Highland, MD       Facility-Administered Medications Ordered in Other Encounters  Medication Dose Route Frequency Provider Last Rate Last Admin   heparin lock flush 100 UNIT/ML injection              Discharge Medications: Please see discharge summary for a list of discharge medications.  Relevant Imaging Results:  Relevant Lab Results:   Additional Information SS# [id number]  Allena Katz, LCSW

## 2022-10-02 NOTE — Hospital Course (Addendum)
72 y.o. male with medical history significant of multiple medical issues including stage IV lung cancer with mets to the bone, history of cerebral microhemorrhage, history of PE, type 2 diabetes, hypertension, lumbar radiculopathy, BPH, hyperlipidemia presenting with left lower extremity weakness and intermittent aphasia.  History from patient as well as his family.  Per report, patient with acute on chronic left lower extremity weakness.  Weakness has been more predominant over the past 2 weeks.  Patient had significant episode of difficulty with ambulation over the past 24 hours.  Also with intermittent episodes of patient staring off as well as aphasia.  Noted to be followed by Dr. Cathie Hoops outpatient with oncology.  Recently completed course of chemotherapy-Keytruda.  No chest pain or shortness of breath.  No nausea or vomiting.  Weakness appears to be chronic issue.  No reported headache, vision changes. Presented to the ER afebrile, hemodynamically stable.  White count 4.9, hemoglobin 10.2, creatinine 123, sodium 130.  Troponin 1 37-1 17.  CT imaging with evolving hemorrhage of left cerebral hemisphere.  Frothy secretions in the right sphenoid sinus concerning for sinusitis.  CT angio chest negative for PE. Noted Progressive bilateral proximal cervical ICA stenoses, 75% or greater on the left and 60% on the right, moderate to severe stenosis of the left vertebral artery origin and severe multifocal stenoses and/or segmental occlusions of the left V4 segment. EKG NSR w/ RBBB.   8/1.  Patient feels total body weakness worse with the left leg.  Neurology starting Keppra and ordering an EEG.  Physical therapy recommends rehab. 8/2.  Patient orthostatic with physical therapy evaluation.  Patient still feels weak worse with the left leg. 8/3.  Patient did better with physical therapy today.  Still complains of weakness worse with his left leg.  Has chronic pain in his legs. 8/4.  Patient feeling better and  interested in going home. 8/5.  Walked better with physical therapy.  Did well with physical therapy.  Patient and wife okay going home this evening.  Will set up home health.

## 2022-10-03 DIAGNOSIS — C3412 Malignant neoplasm of upper lobe, left bronchus or lung: Secondary | ICD-10-CM | POA: Diagnosis not present

## 2022-10-03 DIAGNOSIS — E440 Moderate protein-calorie malnutrition: Secondary | ICD-10-CM | POA: Insufficient documentation

## 2022-10-03 DIAGNOSIS — I951 Orthostatic hypotension: Secondary | ICD-10-CM | POA: Diagnosis not present

## 2022-10-03 DIAGNOSIS — R569 Unspecified convulsions: Secondary | ICD-10-CM | POA: Diagnosis not present

## 2022-10-03 DIAGNOSIS — E1165 Type 2 diabetes mellitus with hyperglycemia: Secondary | ICD-10-CM

## 2022-10-03 DIAGNOSIS — R531 Weakness: Secondary | ICD-10-CM | POA: Diagnosis not present

## 2022-10-03 DIAGNOSIS — I619 Nontraumatic intracerebral hemorrhage, unspecified: Secondary | ICD-10-CM

## 2022-10-03 DIAGNOSIS — D638 Anemia in other chronic diseases classified elsewhere: Secondary | ICD-10-CM

## 2022-10-03 LAB — GLUCOSE, CAPILLARY
Glucose-Capillary: 111 mg/dL — ABNORMAL HIGH (ref 70–99)
Glucose-Capillary: 130 mg/dL — ABNORMAL HIGH (ref 70–99)
Glucose-Capillary: 152 mg/dL — ABNORMAL HIGH (ref 70–99)
Glucose-Capillary: 113 mg/dL — ABNORMAL HIGH (ref 70–99)

## 2022-10-03 MED ORDER — BOOST / RESOURCE BREEZE PO LIQD CUSTOM
1.0000 | Freq: Three times a day (TID) | ORAL | Status: DC
  Administered 2022-10-04 – 2022-10-05 (×4): 1 via ORAL

## 2022-10-03 MED ORDER — SODIUM CHLORIDE 0.9 % IV SOLN: INTRAVENOUS | Status: DC

## 2022-10-03 MED ORDER — ALPRAZOLAM 0.25 MG PO TABS: 0.2500 mg | ORAL_TABLET | Freq: Two times a day (BID) | ORAL | Status: DC | PRN

## 2022-10-03 MED ORDER — LEVETIRACETAM 500 MG PO TABS
500.0000 mg | ORAL_TABLET | Freq: Two times a day (BID) | ORAL | Status: DC
  Administered 2022-10-03 – 2022-10-06 (×6): 500 mg via ORAL
  Filled 2022-10-03 (×6): qty 1

## 2022-10-03 MED ORDER — MIDODRINE HCL 5 MG PO TABS
5.0000 mg | ORAL_TABLET | Freq: Three times a day (TID) | ORAL | Status: DC
  Administered 2022-10-03 (×2): 5 mg via ORAL
  Filled 2022-10-03 (×2): qty 1

## 2022-10-03 MED ORDER — MIDODRINE HCL 5 MG PO TABS
10.0000 mg | ORAL_TABLET | Freq: Three times a day (TID) | ORAL | Status: DC
  Administered 2022-10-04 – 2022-10-06 (×9): 10 mg via ORAL
  Filled 2022-10-03 (×9): qty 2

## 2022-10-03 MED ORDER — LEVETIRACETAM 500 MG PO TABS: 500.0000 mg | ORAL_TABLET | Freq: Two times a day (BID) | ORAL | Status: DC

## 2022-10-03 MED ORDER — ACETAMINOPHEN 325 MG PO TABS: 650.0000 mg | ORAL_TABLET | Freq: Four times a day (QID) | ORAL | Status: DC | PRN

## 2022-10-03 MED ORDER — FINASTERIDE 5 MG PO TABS
5.0000 mg | ORAL_TABLET | Freq: Every day | ORAL | Status: DC
  Administered 2022-10-03 – 2022-10-06 (×4): 5 mg via ORAL
  Filled 2022-10-03 (×4): qty 1

## 2022-10-03 MED ORDER — ADULT MULTIVITAMIN W/MINERALS CH
1.0000 | ORAL_TABLET | Freq: Every day | ORAL | Status: DC
  Administered 2022-10-03 – 2022-10-06 (×4): 1 via ORAL
  Filled 2022-10-03 (×4): qty 1

## 2022-10-03 MED ORDER — OXYCODONE HCL 5 MG PO TABS
15.0000 mg | ORAL_TABLET | ORAL | Status: DC | PRN
  Administered 2022-10-03 – 2022-10-06 (×5): 15 mg via ORAL
  Filled 2022-10-03 (×5): qty 3

## 2022-10-03 MED ORDER — SODIUM CHLORIDE 0.9 % IV BOLUS
500.0000 mL | Freq: Once | INTRAVENOUS | Status: AC
  Administered 2022-10-03: 500 mL via INTRAVENOUS

## 2022-10-03 NOTE — Consult Note (Signed)
PHARMACIST - PHYSICIAN COMMUNICATION  DR: Alford Highland, MD  CONCERNING: IV to Oral Route Change Policy  RECOMMENDATION: This patient is receiving Keppra 500 mg BID by the intravenous route.  Based on criteria approved by the Pharmacy and Therapeutics Committee, the intravenous medication(s) is/are being converted to the equivalent oral dose form(s).   DESCRIPTION: These criteria include: The patient is eating (either orally or via tube) and/or has been taking other orally administered medications for a least 24 hours The patient has no evidence of active gastrointestinal bleeding or impaired GI absorption (gastrectomy, short bowel, patient on TNA or NPO).  If you have questions about this conversion, please contact the Pharmacy Department  []   [phone] )  [facility] [x]   [phone] )  [facility] []   [phone] )  [facility] []   [phone] )  [facility] []   [phone] )  Ilene Qua   Celene Squibb, PharmD Clinical Pharmacist 10/03/2022 1:44 PM

## 2022-10-03 NOTE — Care Management Important Message (Signed)
Important Message  Patient Details  Name: Micheal Donovan MRN: [id number] Date of Birth: 04-02-1950   Medicare Important Message Given:  N/A - LOS <3 / Initial given by admissions     Olegario Messier A  10/03/2022, 7:23 AM

## 2022-10-03 NOTE — Progress Notes (Signed)
Initial Nutrition Assessment  DOCUMENTATION CODES:   Non-severe (moderate) malnutrition in context of chronic illness  INTERVENTION:   -Boost Breeze po TID, each supplement provides 250 kcal and 9 grams of protein  -Liberalize diet to regular for widest variety of meal selections -MVI with minerals daily  NUTRITION DIAGNOSIS:   Moderate Malnutrition related to chronic illness (stage IV lung cancer with mets to bone) as evidenced by energy intake < or equal to 75% for > or equal to 1 month, mild fat depletion, moderate fat depletion, mild muscle depletion, moderate muscle depletion.  GOAL:   Patient will meet greater than or equal to 90% of their needs  MONITOR:   PO intake, Supplement acceptance  REASON FOR ASSESSMENT:   Malnutrition Screening Tool    ASSESSMENT:   Pt with medical history significant of multiple medical issues including stage IV lung cancer with mets to the bone, history of cerebral microhemorrhage, history of PE, type 2 diabetes, hypertension, lumbar radiculopathy, BPH, hyperlipidemia presenting with left lower extremity weakness and intermittent aphasia.  Pt admitted with weakness, mild confusion, and aphasia.   8/1- s/p EEG- no abnormalities  Reviewed I/O's: +700 ml x 24 hours and +450 ml since admission  UOP: 850 ml x 24 hours  Pt somnolent at time of visit. Pt opened eyes and greeted RD when name was called, but quickly went back to sleep.   Pt wife provided history. She reports pt with decreased appetite over the past month, usually consuming bites of one meal per day and snacking a few times per day on crackers. Pt typically consumes a lot of water, soda, and tea. Pt with fair intake, consumed a half of a grilled cheese sandwich for wife report. Noted meal completions 75-100%.   Pt UBW around 180# per wife report. She estimates that he has lost about 50# over the past year progressively, which she attributes to side effects of chemotherapy  treatments (altered taste). Reviewed wt hx; pt has experienced a 3.1% wt loss over the past 3 months, which is not significant for time frame.   Pt has tried multiple supplements in the past (Ensure, Boost, and Premier Protein), however, pt does not like the taste. Reviewed formulary; pt amenable to try Boost Breeze. Also amenable to liberalizing diet for increased variety of meal selections.   Per TOC notes, plan for SNF placement.   Medications reviewed and include keppra and potassium chloride.   Lab Results  Component Value Date   HGBA1C 6.6 (H) 10/01/2022   PTA DM medications are 30 units insulin glargine daily.   Labs reviewed: CBGS: 111-152 (inpatient orders for glycemic control are 0-9 units insulin aspart TID with meals).    NUTRITION - FOCUSED PHYSICAL EXAM:  Flowsheet Row Most Recent Value  Orbital Region Moderate depletion  Upper Arm Region Mild depletion  Thoracic and Lumbar Region Moderate depletion  Buccal Region Mild depletion  Temple Region Moderate depletion  Clavicle Bone Region Moderate depletion  Clavicle and Acromion Bone Region Moderate depletion  Scapular Bone Region Moderate depletion  Dorsal Hand Mild depletion  Patellar Region Moderate depletion  Anterior Thigh Region Moderate depletion  Posterior Calf Region Moderate depletion  Edema (RD Assessment) None  Hair Reviewed  Eyes Reviewed  Mouth Reviewed  Skin Reviewed  Nails Reviewed       Diet Order:   Diet Order             Diet regular Fluid consistency: Thin  Diet effective now  EDUCATION NEEDS:   Education needs have been addressed  Skin:  Skin Assessment: Reviewed RN Assessment  Last BM:  10/02/22 (type 2)  Height:   Ht Readings from Last 1 Encounters:  10/01/22 5\' 7"  (1.702 m)    Weight:   Wt Readings from Last 1 Encounters:  10/01/22 59.9 kg    Ideal Body Weight:  61.4 kg  BMI:  Body mass index is 20.67 kg/m.  Estimated Nutritional Needs:    Kcal:  1800-2000  Protein:  90-105 grams  Fluid:  > 1.8 L    Levada Schilling, RD, LDN, CDCES Registered Dietitian II Certified Diabetes Care and Education Specialist Please refer to [facility] for RD and/or RD on-call/weekend/after hours pager

## 2022-10-03 NOTE — Procedures (Signed)
Routine EEG Report  Micheal Donovan is a 72 y.o. male with a history of seizure who is undergoing an EEG to evaluate for seizures.  Report: This EEG was acquired with electrodes placed according to the International 10-20 electrode system (including Fp1, Fp2, F3, F4, C3, C4, P3, P4, O1, O2, T3, T4, T5, T6, A1, A2, Fz, Cz, Pz). The following electrodes were missing or displaced: none.  The occipital dominant rhythm was 7-8 Hz. This activity is reactive to stimulation. Drowsiness was manifested by background fragmentation; deeper stages of sleep were identified by K complexes and sleep spindles. There was no focal slowing. There were no interictal epileptiform discharges. There were no electrographic seizures identified. There was no abnormal response to photic stimulation or hyperventilation.   Impression and clinical correlation: This EEG was obtained while awake and asleep and is abnormal due to mild diffuse slowing indicative of global cerebral dysfunction. Epileptiform abnormalities were not seen during this recording.  Bing Neighbors, MD [facility] [phone]  If 7pm- 7am, please page neurology on call as listed in AMION.

## 2022-10-03 NOTE — Progress Notes (Signed)
Mobility Specialist - Progress Note  Post-mobility: HR 70, BP 94/51     10/03/22 1618  Mobility  Activity Ambulated with assistance in room;Stood at bedside;Dangled on edge of bed  Level of Assistance Contact guard assist, steadying assist  Assistive Device Front wheel walker  Distance Ambulated (ft) 15 ft  Range of Motion/Exercises Active  Activity Response Tolerated well  Mobility Referral Yes  $Mobility charge 1 Mobility  Mobility Specialist Start Time (ACUTE ONLY) 1603  Mobility Specialist Stop Time (ACUTE ONLY) 1618  Mobility Specialist Time Calculation (min) (ACUTE ONLY) 15 min   Pt sleeping in bed and aroused to sound upon entry on RA. Pt STS and ambulates to hallway/door frame right outside room CGA for satefty with RW. Pt endorses no pain, lightheadedness or SOB during session. Pt given VC to stand tall and keep walker close.  Pt returned to recliner and BP taken 94/51 after sitting in recliner for 4-5 minutes. RN notified of results of mobility bout. Chair alarm activated.   Johnathan Hausen Mobility Specialist 10/03/22, 5:10 PM

## 2022-10-03 NOTE — TOC Progression Note (Signed)
Transition of Care [facility]) - Progression Note    Patient Details  Name: Micheal Donovan MRN: [id number] Date of Birth: 1950/08/21  [facility] CM/SW Contact  Allena Katz, LCSW Phone Number: 10/03/2022, 10:56 AM  Clinical Narrative:   CSW provided list of accepting facilities to patient and wife. They will review and let me know which they prefer.          Expected Discharge Plan and Services                                               Social Determinants of Health (SDOH) Interventions SDOH Screenings   Food Insecurity: No Food Insecurity (10/01/2022)  Housing: Low Risk  (10/01/2022)  Transportation Needs: No Transportation Needs (10/01/2022)  Utilities: Not At Risk (10/01/2022)  Alcohol Screen: Low Risk  (03/27/2022)  Depression (PHQ2-9): High Risk (04/21/2022)  Financial Resource Strain: Low Risk  (04/17/2022)  Physical Activity: Inactive (03/27/2022)  Social Connections: Moderately Isolated (03/27/2022)  Stress: Stress Concern Present (03/27/2022)  Tobacco Use: Medium Risk (10/01/2022)    Readmission Risk Interventions    11/20/2021   10:17 AM  Readmission Risk Prevention Plan  Transportation Screening Complete  PCP or Specialist Appt within 3-5 Days Complete  HRI or Home Care Consult --  Social Work Consult for Recovery Care Planning/Counseling --  Palliative Care Screening Not Applicable  Medication Review Oceanographer) Complete

## 2022-10-03 NOTE — Assessment & Plan Note (Signed)
Continue supplements

## 2022-10-03 NOTE — Consult Note (Addendum)
NEUROLOGY CONSULTATION NOTE   Date of service: October 03, 2022 Patient Name: Micheal Donovan MRN:  [id number] DOB:  [birth year] Reason for consult: staring spells Requesting physician: Dr. Alford Highland _ _ _   _ __   _ __ _ _  __ __   _ __   __ _  History of Present Illness   This is a 72 yo man with hx stage IV lung cancer with mets to bone, hx L cerebellar ICH, HTN, DM2, LLE weakness 2/2 lumbar radiculopathy who was admitted after multiple episodes of staring or aphasia. Initially LLE weakness was felt to have an acute component but wife clarifies it has been unchanged for months. MRI brain confirmed no acute infarct and no e/o mets. He has not had any staring spells or clear motor seizures since admission but did have multiple events at home. He is not on any seizure medications.   ROS   Per HPI: all other systems reviewed and are negative  Past History   I have reviewed the following:  Past Medical History:  Diagnosis Date   Allergy    BPH (benign prostatic hypertrophy)    Cancer (HCC)    Melanoma on Neck    2008   Carotid artery occlusion    CHF (congestive heart failure) (HCC)    Diabetes mellitus    type 2   ED (erectile dysfunction)    GERD (gastroesophageal reflux disease)    Hemorrhagic stroke (HCC) 06/2021   Hyperlipidemia    Hypertension    Lung cancer (HCC)    Neoplasm related pain    Retinopathy due to secondary [name])    Past Surgical History:  Procedure Laterality Date   BRONCHIAL NEEDLE ASPIRATION BIOPSY  06/17/2021   Procedure: BRONCHIAL NEEDLE ASPIRATION BIOPSIES;  Surgeon: Lorin Glass, MD;  Location: [facility] ENDOSCOPY;  Service: Pulmonary;;   IR IMAGING GUIDED PORT INSERTION  08/05/2021   MELANOMA EXCISION  2008   Left side of neck   RADIOLOGY WITH ANESTHESIA N/A 04/05/2020   Procedure: MRI SPINE WITOUT CONTRAST;  Surgeon: Radiologist, Medication, MD;  Location: [facility] OR;  Service: Radiology;  Laterality: N/A;   RADIOLOGY WITH ANESTHESIA N/A 08/22/2021    Procedure: MRI BRAIN WITH AND WITHOUT CONTRAST  WITH ANESTHESIA;  Surgeon: Radiologist, Medication, MD;  Location: [facility] OR;  Service: Radiology;  Laterality: N/A;   RADIOLOGY WITH ANESTHESIA N/A 12/17/2021   Procedure: MRI BRAIN WITH AND WITHOUT CONTRAST WITH ANESTHESIA; MRI LUMBER WITH AND WITHOUT CONTRAST;  Surgeon: Radiologist, Medication, MD;  Location: [facility] OR;  Service: Radiology;  Laterality: N/A;   RADIOLOGY WITH ANESTHESIA N/A 04/08/2022   Procedure: MRI LUMBER SPINE WITH AND WITHOUT CONTRAST;  Surgeon: Radiologist, Medication, MD;  Location: [facility] OR;  Service: Radiology;  Laterality: N/A;   TONSILLECTOMY     VIDEO BRONCHOSCOPY WITH ENDOBRONCHIAL ULTRASOUND N/A 06/17/2021   Procedure: VIDEO BRONCHOSCOPY WITH ENDOBRONCHIAL ULTRASOUND;  Surgeon: Lorin Glass, MD;  Location: [facility] ENDOSCOPY;  Service: Pulmonary;  Laterality: N/A;   Family History  Problem Relation Age of Onset   COPD Mother    Heart disease Father    Heart disease Brother        MI at age 7   Stroke Neg Hx    Social History   Socioeconomic History   Marital status: Married    Spouse name: Not on file   Number of children: Not on file   Years of education: Not on file   Highest education level: Not  on file  Occupational History   Not on file  Tobacco Use   Smoking status: Former    Current packs/day: 0.00    Types: Cigarettes    Quit date: 07/21/1990    Years since quitting: 32.2   Smokeless tobacco: Never  Vaping Use   Vaping status: Never Used  Substance and Sexual Activity   Alcohol use: No   Drug use: No   Sexual activity: Not Currently  Other Topics Concern   Not on file  Social History Narrative   Not on file   Social Determinants of Health   Financial Resource Strain: Low Risk  (04/17/2022)   Overall Financial Resource Strain (CARDIA)    Difficulty of Paying Living Expenses: Not hard at all  Food Insecurity: No Food Insecurity (10/01/2022)   Hunger Vital Sign    Worried About Running Out of Food  in the Last Year: Never true    Ran Out of Food in the Last Year: Never true  Transportation Needs: No Transportation Needs (10/01/2022)   PRAPARE - Administrator, Civil Service (Medical): No    Lack of Transportation (Non-Medical): No  Physical Activity: Inactive (03/27/2022)   Exercise Vital Sign    Days of Exercise per Week: 0 days    Minutes of Exercise per Session: 0 min  Stress: Stress Concern Present (03/27/2022)   [employer] of Occupational Health - Occupational Stress Questionnaire    Feeling of Stress : To some extent  Social Connections: Moderately Isolated (03/27/2022)   Social Connection and Isolation Panel [NHANES]    Frequency of Communication with Friends and Family: More than three times a week    Frequency of Social Gatherings with Friends and Family: Three times a week    Attends Religious Services: Never    Active Member of Clubs or Organizations: No    Attends Banker Meetings: Never    Marital Status: Married   Allergies  Allergen Reactions   Niaspan [Niacin]     FLUSHING    Medications   Medications Prior to Admission  Medication Sig Dispense Refill Last Dose   aspirin EC 81 MG tablet Take 1 tablet (81 mg total) by mouth daily. Swallow whole. 30 tablet 12 09/30/2022 at 0800   atorvastatin (LIPITOR) 40 MG tablet TAKE 1 TABLET(40 MG) BY MOUTH DAILY 60 tablet 2 Past Week at Unknown   docusate sodium (COLACE) 100 MG capsule Take 100 mg by mouth 2 (two) times daily as needed for mild constipation.   Unknown at PRN   fentaNYL (DURAGESIC) 75 MCG/HR Place 1 patch onto the skin every 3 (three) days. 10 patch 0 09/29/2022 at Unknown   furosemide (LASIX) 20 MG tablet TAKE 1 TABLET(20 MG) BY MOUTH DAILY AS NEEDED FOR LEG SWELLING OR SHORTNESS OF BREATH 90 tablet 0 Unknown at PRN   gabapentin (NEURONTIN) 300 MG capsule TAKE 1 CAPSULE(300 MG) BY MOUTH THREE TIMES DAILY 90 capsule 1 09/30/2022 at 2100   insulin glargine (LANTUS) 100 UNIT/ML  injection Inject 30 Units into the skin daily as needed (BG > 150).   Unknown at PRN   megestrol (MEGACE) 40 MG tablet Take 1 tablet (40 mg total) by mouth daily. 30 tablet 3 Past Week at Unknown   NOVOLOG FLEXPEN 100 UNIT/ML FlexPen Inject 0-10 Units into the skin daily as needed for high blood sugar (BG > 200).   Unknown at PRN   Omeprazole 20 MG TBEC Take 20 mg by mouth daily.  oxyCODONE-acetaminophen (PERCOCET) 7.5-325 MG tablet Take 1 tablet by mouth every 4 (four) hours as needed for severe pain. Do not take with cough medication or other pain medication 60 tablet 0 Unknown at PRN   potassium chloride (KLOR-CON M) 10 MEQ tablet Take 2 tablets (20 mEq total) by mouth daily. (Patient taking differently: Take 10 mEq by mouth daily.) 60 tablet 11 09/30/2022 at 0800   solifenacin (VESICARE) 10 MG tablet Take 1 tablet (10 mg total) by mouth daily. 30 tablet 11 Past Week at Unknown   tamsulosin (FLOMAX) 0.4 MG CAPS capsule TAKE 1 CAPSULE(0.4 MG) BY MOUTH DAILY 30 capsule 0 Past Week at Unknown   traZODone (DESYREL) 50 MG tablet Take 1 tablet (50 mg total) by mouth at bedtime as needed for sleep. 30 tablet 3 09/30/2022 at PRN   valsartan (DIOVAN) 80 MG tablet Take 80 mg by mouth daily.   Past Week at Unknown   folic acid (FOLVITE) 800 MCG tablet Take 1 tablet (800 mcg total) by mouth daily. (Patient not taking: Reported on 10/01/2022) 60 tablet 1 Not Taking   UNABLE TO FIND PLEASE CHECK FASTING BLOOD GLUCOSE EVERY MORNING 1 each 0       Current Facility-Administered Medications:    aspirin EC tablet 81 mg, 81 mg, Oral, Daily, Renae Gloss, Richard, MD, 81 mg at 10/02/22 0940   doxycycline (VIBRA-TABS) tablet 100 mg, 100 mg, Oral, Q12H, Floydene Flock, MD, 100 mg at 10/02/22 2113   fentaNYL (DURAGESIC) 75 MCG/HR 1 patch, 1 patch, Transdermal, Q72H, Alford Highland, MD, 1 patch at 10/02/22 0939   furosemide (LASIX) tablet 20 mg, 20 mg, Oral, Daily PRN, Wieting, Richard, MD   gabapentin (NEURONTIN)  capsule 300 mg, 300 mg, Oral, TID, Wieting, Richard, MD, 300 mg at 10/02/22 2113   insulin aspart (novoLOG) injection 0-9 Units, 0-9 Units, Subcutaneous, TID WC, Floydene Flock, MD, 1 Units at 10/02/22 1703   [COMPLETED] levETIRAcetam (KEPPRA) IVPB 1500 mg/ 100 mL premix, 1,500 mg, Intravenous, Once, Last Rate: 400 mL/hr at 10/02/22 1548, 1,500 mg at 10/02/22 1548 **FOLLOWED BY** levETIRAcetam (KEPPRA) IVPB 500 mg/100 mL premix, 500 mg, Intravenous, Q12H, Jefferson Fuel, MD, Last Rate: 400 mL/hr at 10/03/22 0400, 500 mg at 10/03/22 0400   oxyCODONE-acetaminophen (PERCOCET) 7.5-325 MG per tablet 1 tablet, 1 tablet, Oral, Q4H PRN, Wieting, Richard, MD   pantoprazole (PROTONIX) EC tablet 40 mg, 40 mg, Oral, Daily, Wieting, Richard, MD, 40 mg at 10/02/22 0940   potassium chloride (KLOR-CON M) CR tablet 10 mEq, 10 mEq, Oral, Daily, Wieting, Richard, MD, 10 mEq at 10/02/22 0940   tamsulosin (FLOMAX) capsule 0.4 mg, 0.4 mg, Oral, QPC breakfast, Wieting, Richard, MD, 0.4 mg at 10/02/22 0940   traZODone (DESYREL) tablet 50 mg, 50 mg, Oral, QHS PRN, Wieting, Richard, MD  Facility-Administered Medications Ordered in Other Encounters:    heparin lock flush 100 UNIT/ML injection, , , ,   Vitals   Vitals:   10/02/22 2030 10/02/22 2113 10/03/22 0015 10/03/22 0449  BP: (!) 83/48 (!) 92/43 (!) 97/42 (!) 104/56  Pulse: 63  67 72  Resp: 19  18 20   Temp: (!) 97.5 F (36.4 C)  98 F (36.7 C) 98.2 F (36.8 C)  TempSrc:   Oral Oral  SpO2: 97%  99% 98%  Weight:      Height:         Body mass index is 20.67 kg/m.  Physical Exam   Gen: patient lying in bed, NAD CV: extremities appear  well-perfused Resp: normal WOB  Neurologic exam MS: lethargic, oriented x2, follows some simple commands Speech: mild dysarthria, no aphasia CN: PERRL, VFF, EOMI, sensation intact, face symmetric, hearing intact to voice Motor: 4/5 strength diffusely except 3/5 LLE Sensory: SILT Coordination: FNF intact  bilat Gait: deferred    Labs   CBC:  Recent Labs  Lab 09/29/22 1259 10/01/22 1407 10/03/22 0428  WBC 6.6 4.9 4.1  NEUTROABS 5.0  --   --   HGB 9.0* 10.2* 8.7*  HCT 28.0* 32.2* 26.2*  MCV 89.5 90.4 86.5  PLT 383 262 259    Basic Metabolic Panel:  Lab Results  Component Value Date   NA 136 10/03/2022   K 3.5 10/03/2022   CO2 21 (L) 10/03/2022   GLUCOSE 113 (H) 10/03/2022   BUN 17 10/03/2022   CREATININE 1.14 10/03/2022   CALCIUM 8.3 (L) 10/03/2022   GFRNONAA >60 10/03/2022   GFRAA >89 03/05/2016   Lipid Panel:  Lab Results  Component Value Date   LDLCALC 88 10/02/2022   HgbA1c:  Lab Results  Component Value Date   HGBA1C 6.6 (H) 10/01/2022   Urine Drug Screen:     Component Value Date/Time   LABOPIA NONE DETECTED 06/11/2021 0239   COCAINSCRNUR NONE DETECTED 06/11/2021 0239   LABBENZ NONE DETECTED 06/11/2021 0239   AMPHETMU NONE DETECTED 06/11/2021 0239   THCU NONE DETECTED 06/11/2021 0239   LABBARB NONE DETECTED 06/11/2021 0239    Alcohol Level     Component Value Date/Time   ETH <10 06/11/2021 0118    CT Head without contrast: 1. Evolving hemorrhage in the left cerebellar hemisphere. No new sites of hemorrhage visualized. 2. Frothy secretions in the right sphenoid sinus, which can be seen in the setting of acute sinusitis.  CT angio Head and Neck with contrast: 1. No emergent large vessel occlusion. 2. Progressive atherosclerosis in the head and neck including a new severe right cavernous ICA stenosis. 3. Progressive bilateral proximal cervical ICA stenoses, 75% or greater on the left and 60% on the right. 4. Moderate to severe stenosis of the left vertebral artery origin and severe multifocal stenoses and/or segmental occlusions of the left V4 segment. 5. 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 6.  Aortic Atherosclerosis (ICD10-I70.0).  MRI Brain 1. No acute intracranial abnormality. No evidence for intraparenchymal  metastatic disease. 2. Chronic left cerebellar hemorrhage with multiple additional chronic micro hemorrhages elsewhere throughout the brain, stable. 3. Underlying mild chronic microvascular ischemic disease.  MRI L spine wwo 1. No MRI evidence for acute infection or other abnormality within the lumbar spine. 2. Few subcentimeter T1 hypointense lesions about the visualized posterior iliac wings. These correspond with small sclerotic foci seen on most recent CT from 04/08/2022, presumably reflecting patient's known osseous metastatic disease. These were not hypermetabolic on prior PET-CT from 84/13/2440. 3. No other evidence for metastatic disease within the lumbar spine itself. 4. Chronic L2 compression fracture, stable. Large Schmorl's node deformity with associated height loss at L5, also stable. 5. Multifactorial degenerative changes at L3-4 through L5-S1 with resultant mild to moderate bilateral subarticular stenosis, with mild to moderate bilateral L3 through L5 foraminal narrowing as above.  CNS imaging personally reviewed; I agree with above interpretations with caveat that L cerebellar hyperintensity on head CT represents residual calcifications in location of remote hemorrhage  Impression   This is a 72 yo man with hx stage IV lung cancer with mets to bone, hx L cerebellar ICH, HTN, DM2,  LLE weakness 2/2 lumbar radiculopathy who was admitted after multiple episodes of staring or aphasia c/f aphasia. Given his hx ICH and abnormal MRI AED tx is warranted. Will order EEG and start him on keppra. No intervention indicatedd for his cerebrovascular disease given his poor prognosis.  Recommendations   - Keppra 2g load f/b 500mg  bid - rEEG - Will continue to follow ______________________________________________________________________   Thank you for the opportunity to take part in the care of this patient. If you have any further questions, please contact the neurology  consultation attending.  Signed,  Bing Neighbors, MD [facility] [phone]  If 7pm- 7am, please page neurology on call as listed in AMION.  **Any copied and pasted documentation in this note was written by me in another application not billed for and pasted by me into this document.

## 2022-10-03 NOTE — Plan of Care (Signed)
Neurology plan of care  Please see neurology consult note from yesterday. rEEG showed no epileptiform abnormalities, however given clinical description of events c/w seizure in setting of hx ICH and abnormal MRI, recommend continuation of keppra at discharge. I will arrange for outpatient neuro f/u. Neurology will be available prn for questions going forward.  Bing Neighbors, MD [facility] [phone]  If 7pm- 7am, please page neurology on call as listed in AMION.

## 2022-10-03 NOTE — Progress Notes (Signed)
Occupational Therapy Treatment Patient Details Name: Micheal Donovan MRN: [id number] DOB: Jul 15, 1950 Today's Date: 10/03/2022   History of present illness Pt is a 72 year old male presenting with LLE weakness and aphasia; imaging showed likely chronic hemorrhagic changes with calcification. Pmh significant for stage IV lung cancer with mets to the bone, history of cerebral microhemorrhage, history of PE, type 2 diabetes, hypertension, lumbar radiculopathy, BPH, hyperlipidemia, Chronic L2 compression fracture, stable   OT comments  Pt continues to present with reduced strength, endurance, balance, activity tolerance. Pt is better oriented today, no evidence of aphasia. Pt is able to complete bed mobility, transfers, short distance ambulation, with SUPV-Mod I: increased time and effort for all, but no physical assistance required. Pt demonstrates improved sitting and standing balance today, is able to maintain for greater length of time than during yesterday's rehab sessions. Pt endorses 6/10 b/l LE pain, which he states is new, and 6/10 lower back pain, which he describes as chronic. Pt and spouse interested in discussing DC options, with pt adamant that he wants to go home, wife unsure if she can meet his care needs and thinks that pt could benefit from short-term rehab. Pt has been receiving HH rehab services. If DCing home, would recommend increased level of this service.    Recommendations for follow up therapy are one component of a multi-disciplinary discharge planning process, led by the attending physician.  Recommendations may be updated based on patient status, additional functional criteria and insurance authorization.    Assistance Recommended at Discharge Intermittent Supervision/Assistance  Patient can return home with the following  A little help with walking and/or transfers;A little help with bathing/dressing/bathroom;Assistance with cooking/housework;Assist for transportation;Help with  stairs or ramp for entrance   Equipment Recommendations  None recommended by OT    Recommendations for Other Services      Precautions / Restrictions Precautions Precautions: Fall Restrictions Weight Bearing Restrictions: No       Mobility Bed Mobility Overal bed mobility: Needs Assistance Bed Mobility: Supine to Sit, Sit to Supine     Supine to sit: Modified independent (Device/Increase time) Sit to supine: Modified independent (Device/Increase time)   General bed mobility comments: increased time, effort    Transfers Overall transfer level: Needs assistance Equipment used: Rolling walker (2 wheels) Transfers: Sit to/from Stand Sit to Stand: Modified independent (Device/Increase time)     Step pivot transfers: Supervision     General transfer comment: No physical assistance required; SUPV for safety     Balance Overall balance assessment: Needs assistance Sitting-balance support: Feet supported, Bilateral upper extremity supported Sitting balance-Leahy Scale: Good Sitting balance - Comments: Good initial sitting balance. Pt fatiguing after ~ 20 minutes EOB sitting, then developed R lateral lean Postural control: Right lateral lean Standing balance support: Bilateral upper extremity supported, During functional activity, Reliant on assistive device for balance Standing balance-Leahy Scale: Good                             ADL either performed or assessed with clinical judgement   ADL                                              Extremity/Trunk Assessment Upper Extremity Assessment Upper Extremity Assessment: Generalized weakness   Lower Extremity Assessment Lower Extremity Assessment: Generalized weakness;LLE deficits/detail LLE  Deficits / Details: difficutly with against gravity movement while laying in bed        Vision       Perception     Praxis      Cognition Arousal/Alertness: Awake/alert Behavior During  Therapy: WFL for tasks assessed/performed Overall Cognitive Status: Within Functional Limits for tasks assessed                                 General Comments: Pt A&O x 4 this afternoon        Exercises Other Exercises Other Exercises: Educ re: safe ADL performance. Extensive discussion of DC recs.    Shoulder Instructions       General Comments      Pertinent Vitals/ Pain       Pain Assessment Pain Assessment: 0-10 Pain Score: 6  Pain Location: b/l LE; lower back Pain Descriptors / Indicators: Aching, Grimacing Pain Intervention(s): Repositioned, Monitored during session  Home Living                                          Prior Functioning/Environment              Frequency  Min 1X/week        Progress Toward Goals  OT Goals(current goals can now be found in the care plan section)  Progress towards OT goals: Progressing toward goals  Acute Rehab OT Goals OT Goal Formulation: With patient/family Time For Goal Achievement: 10/16/22 Potential to Achieve Goals: Good  Plan Discharge plan needs to be updated    Co-evaluation                 AM-PAC OT "6 Clicks" Daily Activity     Outcome Measure   Help from another person eating meals?: None Help from another person taking care of personal grooming?: A Little Help from another person toileting, which includes using toliet, bedpan, or urinal?: A Lot Help from another person bathing (including washing, rinsing, drying)?: A Little Help from another person to put on and taking off regular upper body clothing?: A Little Help from another person to put on and taking off regular lower body clothing?: A Lot 6 Click Score: 17    End of Session Equipment Utilized During Treatment: Rolling walker (2 wheels)  OT Visit Diagnosis: Other abnormalities of gait and mobility (R26.89);Unsteadiness on feet (R26.81);Muscle weakness (generalized) (M62.81)   Activity Tolerance  Patient tolerated treatment well   Patient Left in bed;with call bell/phone within reach;with family/visitor present   Nurse Communication          Time: 1410-1450 OT Time Calculation (min): 40 min  Charges: OT Treatments $Self Care/Home Management : 38-52 mins  Latina Craver, [name], MS, OTR/L 10/03/22, 3:23 PM

## 2022-10-03 NOTE — Progress Notes (Addendum)
Physical Therapy Treatment Patient Details Name: Micheal Donovan MRN: [id number] DOB: [birth year] Today's Date: 10/03/2022   History of Present Illness Pt is a 72 year old male presenting with LLE weakness and aphasia; imaging showed likely chronic hemorrhagic changes with calcification. Pmh significant for stage IV lung cancer with mets to the bone, history of cerebral microhemorrhage, history of PE, type 2 diabetes, hypertension, lumbar radiculopathy, BPH, hyperlipidemia, Chronic L2 compression fracture, stable    PT Comments  Pt presents sitting in recliner following bathing with nursing tech, wife in the room and no complaints of pain. PT assessed orthostatics during session, resulting in positive findings (BP in sitting 91/53, Sit>stand 80/43). Pt able to scoot and perform Sit<>stand with CGA and RW for safety. PT unable to continue treatment due to orthostatic assessment and patient tolerance. PT noted pt to be increasingly lethargic by the end of session but in no acute distress (90/51) and answering questions, MD notified of findings. Pt would benefit from continued skilled therapy to maximize functional abilities.     If plan is discharge home, recommend the following: A little help with walking and/or transfers;A little help with bathing/dressing/bathroom;Assistance with cooking/housework;Assist for transportation;Help with stairs or ramp for entrance;Direct supervision/assist for medications management;Direct supervision/assist for financial management   Can travel by private vehicle     No  Equipment Recommendations  None recommended by PT    Recommendations for Other Services       Precautions / Restrictions Precautions Precautions: Fall Restrictions Weight Bearing Restrictions: No     Mobility  Bed Mobility               General bed mobility comments: Pt in recliner upon arrival    Transfers Overall transfer level: Needs assistance Equipment used: Rolling walker (2  wheels) Transfers: Sit to/from Stand Sit to Stand: Min guard           General transfer comment: min guard for safety, pt orthostatic following STS (80/53)    Ambulation/Gait               General Gait Details: Unsafe to walk at this time due to orthostatics   Stairs             Wheelchair Mobility     Tilt Bed    Modified Rankin (Stroke Patients Only)       Balance Overall balance assessment: Needs assistance Sitting-balance support: Feet supported, Bilateral upper extremity supported Sitting balance-Leahy Scale: Fair     Standing balance support: Bilateral upper extremity supported, During functional activity, Reliant on assistive device for balance Standing balance-Leahy Scale: Fair                              Cognition Arousal/Alertness: Awake/alert Behavior During Therapy: Flat affect, WFL for tasks assessed/performed   Area of Impairment: Problem solving                             Problem Solving: Slow processing          Exercises      General Comments        Pertinent Vitals/Pain Pain Assessment Pain Assessment: No/denies pain    Home Living                          Prior Function  PT Goals (current goals can now be found in the care plan section) Acute Rehab PT Goals Patient Stated Goal: return home PT Goal Formulation: With patient Time For Goal Achievement: 10/16/22 Potential to Achieve Goals: Good Progress towards PT goals: Progressing toward goals    Frequency    Min 1X/week      PT Plan Current plan remains appropriate    Co-evaluation              AM-PAC PT "6 Clicks" Mobility   Outcome Measure  Help needed turning from your back to your side while in a flat bed without using bedrails?: A Little Help needed moving from lying on your back to sitting on the side of a flat bed without using bedrails?: A Little Help needed moving to and from a bed to  a chair (including a wheelchair)?: A Little Help needed standing up from a chair using your arms (e.g., wheelchair or bedside chair)?: A Little Help needed to walk in hospital room?: A Lot Help needed climbing 3-5 steps with a railing? : A Lot 6 Click Score: 16    End of Session Equipment Utilized During Treatment: Gait belt Activity Tolerance: Patient tolerated treatment well Patient left: in chair;with family/visitor present   PT Visit Diagnosis: Other abnormalities of gait and mobility (R26.89);Difficulty in walking, not elsewhere classified (R26.2);Muscle weakness (generalized) (M62.81)     Time: 7846-9629 PT Time Calculation (min) (ACUTE ONLY): 20 min  Charges:    $Therapeutic Activity: 8-22 mins PT General Charges $$ ACUTE PT VISIT: 1 Visit                     , PT, SPT 12:19 PM,10/03/22

## 2022-10-03 NOTE — Progress Notes (Signed)
Progress Note   Patient: Micheal Donovan JYN:[id number] DOB: April 12, 1950 DOA: 10/01/2022     2 DOS: the patient was seen and examined on 10/03/2022   Brief hospital course: 72 y.o. male with medical history significant of multiple medical issues including stage IV lung cancer with mets to the bone, history of cerebral microhemorrhage, history of PE, type 2 diabetes, hypertension, lumbar radiculopathy, BPH, hyperlipidemia presenting with left lower extremity weakness and intermittent aphasia.  History from patient as well as his family.  Per report, patient with acute on chronic left lower extremity weakness.  Weakness has been more predominant over the past 2 weeks.  Patient had significant episode of difficulty with ambulation over the past 24 hours.  Also with intermittent episodes of patient staring off as well as aphasia.  Noted to be followed by Dr. Cathie Hoops outpatient with oncology.  Recently completed course of chemotherapy-Keytruda.  No chest pain or shortness of breath.  No nausea or vomiting.  Weakness appears to be chronic issue.  No reported headache, vision changes. Presented to the ER afebrile, hemodynamically stable.  White count 4.9, hemoglobin 10.2, creatinine 123, sodium 130.  Troponin 1 37-1 17.  CT imaging with evolving hemorrhage of left cerebral hemisphere.  Frothy secretions in the right sphenoid sinus concerning for sinusitis.  CT angio chest negative for PE. Noted Progressive bilateral proximal cervical ICA stenoses, 75% or greater on the left and 60% on the right, moderate to severe stenosis of the left vertebral artery origin and severe multifocal stenoses and/or segmental occlusions of the left V4 segment. EKG NSR w/ RBBB.   8/1.  Patient feels total body weakness worse with the left leg.  Neurology starting Keppra and ordering an EEG.  Physical therapy recommends rehab. 8/2.  Patient orthostatic with physical therapy evaluation.  Patient still feels weak worse with the left  leg.  Assessment and Plan: * Orthostatic hypotension Fluid bolus started today.  TED hose.  Started midodrine 10 mg 3 times daily.  Patient received a steroid injection last week so I cannot order a a.m. cortisol at this point.  Continue working with physical therapy.  IV fluid hydration.  Hold Flomax and irbesartan.  Seizure [facility]) Neurology starting Keppra.  EEG negative but neurology still recommends continuing Keppra as outpatient.  Weakness Noted worsening baseline weakness of left lower extremity in addition to mild confusion and aphasia Baseline history of hemorrhagic CVA, lung cancer with metastatic disease Case discussed with Dr. Selina Cooley with neurology Physical therapy recommending rehab  Primary non-small cell carcinoma of upper lobe of left lung (HCC) Followed by Dr. Cathie Hoops outpatient.  Receiving Keytruda as outpatient. 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   Sinusitis Placed on doxycycline by admitting physician  Malnutrition of moderate degree Continue supplements.  Myocardial ischemia Likely demand ischemia with stroke workup.  Recent echo showed a normal EF.  No complaints of chest pain.  NSTEMI ruled out.  (HFpEF) heart failure with preserved ejection fraction [facility]) 2D echo April 2024 with EF of 55 to 60% and grade 1 diastolic dysfunction Chronic in nature.  On Lasix as needed.  Anemia of chronic disease Hemoglobin down to 8.7.  Continue to monitor with fluids.  Pulmonary embolus (HCC) Prior history of pulmonary embolism.  Not on anticoagulation secondary to brain bleed.  Type 2 diabetes mellitus with hyperlipidemia (HCC) Hemoglobin A1c 6.6.  Sliding  scale insulin.  Holding Lipitor.  Type 2 diabetes mellitus with hyperglycemia (HCC) Hemoglobin A1c 6.6.  On sliding scale insulin.  Carotid stenosis, bilateral Noted progressive  bilateral proximal cervical ICA stenoses, 75% or greater on the left and 60% on the right. Moderate to severe stenosis of the left vertebral artery origin and severe multifocal stenoses and/or segmental occlusions of the left V4 segment noted on imaging.     HYPERCHOLESTEROLEMIA Hold Lipitor just in case contributing to weakness.        Subjective: Patient orthostatic with physical therapy today.  Still with left leg weakness.  Limited on what he was able to do with physical therapy.  Physical Exam: Vitals:   10/03/22 1058 10/03/22 1211 10/03/22 1523 10/03/22 1608  BP: (!) 100/54 (!) 107/54 (!) 86/60 (!) 94/51  Pulse:  67 61 70  Resp:   16   Temp:   98 F (36.7 C)   TempSrc:      SpO2:   98%   Weight:      Height:       Physical Exam HENT:     Head: Normocephalic.     Mouth/Throat:     Pharynx: No oropharyngeal exudate.  Eyes:     General: Lids are normal.     Conjunctiva/sclera: Conjunctivae normal.  Cardiovascular:     Rate and Rhythm: Normal rate and regular rhythm.     Heart sounds: Normal heart sounds, S1 normal and S2 normal.  Pulmonary:     Breath sounds: No decreased breath sounds, wheezing, rhonchi or rales.  Abdominal:     Palpations: Abdomen is soft.     Tenderness: There is no abdominal tenderness.  Musculoskeletal:     Right lower leg: No swelling.     Left lower leg: No swelling.  Skin:    General: Skin is warm.     Findings: No rash.  Neurological:     Mental Status: He is alert.     Comments: Answers all questions appropriately.  Power 4+ out of 5 upper and lower extremities bilaterally.     Data Reviewed: Hemoglobin 8.7, creatinine 1.14  Family Communication: Spoke with wife at the bedside  Disposition: Status is: Inpatient Remains inpatient appropriate because: Patient very orthostatic today starting midodrine and giving a fluid bolus and IV fluids.  Planned Discharge Destination: Rehab    Time spent: 28 minutes  Author: Alford Highland, MD 10/03/2022 5:00 PM  For on call review [email or web address].

## 2022-10-03 NOTE — Progress Notes (Signed)
Speech Language Pathology Treatment: Cognitive-Linquistic  Patient Details Name: Micheal Donovan MRN: [id number] DOB: [birth year] Today's Date: 10/03/2022 Time: 9562-1308 SLP Time Calculation (min) (ACUTE ONLY): 22 min  Assessment / Plan / Recommendation Clinical Impression  Pt seen for cognitive-linguistic tx. Wife present for duration of tx. Continued diagnostic assessment completed utilizing SLUMS. Pt scored 19/30. Pt with cognitive-linguistic deficits affecting attention, memory (immediate, short term, working), problem solving, executive functioning, and insight. Pt stating, "if I go home, I'll be better." Pt markedly more alert and participatory this date. Flat affect and reduced eye contact persists. Recommend continued SLP services acute and post-acute for above mentioned deficits. Pt and wife made aware of progress to date, results of assessment, recommendations, and SLP POC. Both verbalized understanding.    HPI HPI: Pt is  72 y.o. male with who presented to [facility] ED on 7/31 with weakness, difficulty with ambulation, and intermittent episodes of staring off and aphadia. Stroke work up initiated. Pt with medical history significant of multiple medical issues including stage IV lung cancer with mets to the bone, history of cerebral microhemorrhage, history of PE, type 2 diabetes, hypertension, lumbar radiculopathy, BPH, hyperlipidemia presenting with left lower extremity weakness and intermittent aphasia. CT imaging with evolving hemorrhage of left cerebral hemisphere.  Frothy secretions in the right sphenoid sinus concerning for sinusitis.  CT angio chest negative for PE. Noted Progressive bilateral proximal cervical ICA stenoses, 75% or greater on the left and 60% on the right, moderate to severe stenosis of the left vertebral artery origin and severe multifocal stenoses and/or segmental occlusions of the left V4 segment. EKG NSR w/ RBBB.      SLP Plan  Continue with current plan of care       Recommendations for follow up therapy are one component of a multi-disciplinary discharge planning process, led by the attending physician.  Recommendations may be updated based on patient status, additional functional criteria and insurance authorization.    Recommendations                         Intermittent Supervision/Assistance Cognitive communication deficit (M57.846)     Continue with current plan of care    Clyde Canterbury, M.S., CCC-SLP Speech-Language Pathologist [facility] [phone] Arnette Felts)  Woodroe Chen  10/03/2022, 11:43 AM

## 2022-10-03 NOTE — Assessment & Plan Note (Signed)
Hemoglobin 9.1 -

## 2022-10-04 DIAGNOSIS — I951 Orthostatic hypotension: Secondary | ICD-10-CM | POA: Diagnosis not present

## 2022-10-04 DIAGNOSIS — R531 Weakness: Secondary | ICD-10-CM | POA: Diagnosis not present

## 2022-10-04 DIAGNOSIS — R569 Unspecified convulsions: Secondary | ICD-10-CM | POA: Diagnosis not present

## 2022-10-04 DIAGNOSIS — C3412 Malignant neoplasm of upper lobe, left bronchus or lung: Secondary | ICD-10-CM | POA: Diagnosis not present

## 2022-10-04 LAB — GLUCOSE, CAPILLARY
Glucose-Capillary: 99 mg/dL (ref 70–99)
Glucose-Capillary: 185 mg/dL — ABNORMAL HIGH (ref 70–99)
Glucose-Capillary: 157 mg/dL — ABNORMAL HIGH (ref 70–99)
Glucose-Capillary: 123 mg/dL — ABNORMAL HIGH (ref 70–99)

## 2022-10-04 NOTE — Progress Notes (Signed)
Progress Note   Patient: Micheal Donovan YQM:[id number] DOB: 1950-04-22 DOA: 10/01/2022     3 DOS: the patient was seen and examined on 10/04/2022   Brief hospital course: 72 y.o. male with medical history significant of multiple medical issues including stage IV lung cancer with mets to the bone, history of cerebral microhemorrhage, history of PE, type 2 diabetes, hypertension, lumbar radiculopathy, BPH, hyperlipidemia presenting with left lower extremity weakness and intermittent aphasia.  History from patient as well as his family.  Per report, patient with acute on chronic left lower extremity weakness.  Weakness has been more predominant over the past 2 weeks.  Patient had significant episode of difficulty with ambulation over the past 24 hours.  Also with intermittent episodes of patient staring off as well as aphasia.  Noted to be followed by Dr. Cathie Hoops outpatient with oncology.  Recently completed course of chemotherapy-Keytruda.  No chest pain or shortness of breath.  No nausea or vomiting.  Weakness appears to be chronic issue.  No reported headache, vision changes. Presented to the ER afebrile, hemodynamically stable.  White count 4.9, hemoglobin 10.2, creatinine 123, sodium 130.  Troponin 1 37-1 17.  CT imaging with evolving hemorrhage of left cerebral hemisphere.  Frothy secretions in the right sphenoid sinus concerning for sinusitis.  CT angio chest negative for PE. Noted Progressive bilateral proximal cervical ICA stenoses, 75% or greater on the left and 60% on the right, moderate to severe stenosis of the left vertebral artery origin and severe multifocal stenoses and/or segmental occlusions of the left V4 segment. EKG NSR w/ RBBB.   8/1.  Patient feels total body weakness worse with the left leg.  Neurology starting Keppra and ordering an EEG.  Physical therapy recommends rehab. 8/2.  Patient orthostatic with physical therapy evaluation.  Patient still feels weak worse with the left leg. 8/3.   Patient did better with physical therapy today.  Still complains of weakness worse with his left leg.  Has chronic pain in his legs.  Assessment and Plan: * Orthostatic hypotension Continue midodrine 10 mg 3 times daily.  TED hose.  Patient received a steroid injection last week so I cannot order a a.m. cortisol at this point.  Continue working with physical therapy.  Gentle IV fluid hydration.  Hold Flomax and irbesartan.  Seizure [facility]) Neurology starting Keppra.  EEG negative but neurology still recommends continuing Keppra as outpatient.  Weakness Noted worsening baseline weakness of left lower extremity in addition to mild confusion and aphasia Baseline history of hemorrhagic CVA, lung cancer with metastatic disease Physical therapy recommending rehab  Primary non-small cell carcinoma of upper lobe of left lung (HCC) Followed by Dr. Cathie Hoops outpatient.  Receiving Keytruda as outpatient. 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   Sinusitis Placed on doxycycline by admitting physician  Malnutrition of moderate degree Continue supplements.  Myocardial ischemia Likely demand ischemia with stroke workup.  Recent echo showed a normal EF.  No complaints of chest pain.  NSTEMI ruled out.  (HFpEF) heart failure with preserved ejection fraction [facility]) 2D echo April 2024 with EF of 55 to 60% and grade 1 diastolic dysfunction Chronic in nature.  On Lasix as needed.  Anemia of chronic disease Hemoglobin down to 8.3.  Continue to monitor with fluids.  Pulmonary embolus (HCC) Prior history of pulmonary embolism.  Not on anticoagulation secondary to brain bleed.  Type  2 diabetes mellitus with hyperlipidemia (HCC) Hemoglobin A1c 6.6.  Sliding scale insulin.  Holding Lipitor.  Type 2 diabetes mellitus with hyperglycemia (HCC) Hemoglobin A1c 6.6.  On sliding scale insulin.  Carotid  stenosis, bilateral Noted progressive bilateral proximal cervical ICA stenoses, 75% or greater on the left and 60% on the right. Moderate to severe stenosis of the left vertebral artery origin and severe multifocal stenoses and/or segmental occlusions of the left V4 segment noted on imaging.     HYPERCHOLESTEROLEMIA Hold Lipitor just in case contributing to weakness.        Subjective: Patient still complains of weakness left lower extremity.  Did better today with walking with physical therapy.  Physical Exam: Vitals:   10/03/22 2105 10/04/22 0419 10/04/22 0746 10/04/22 1228  BP: (!) 104/53 (!) 122/59 (!) 112/57 (!) 101/58  Pulse: (!) 56 64 72 66  Resp: 19 16 16    Temp: (!) 97.5 F (36.4 C) 98 F (36.7 C) 97.9 F (36.6 C)   TempSrc:      SpO2: 100% 100% 99%   Weight:      Height:       Physical Exam HENT:     Head: Normocephalic.     Mouth/Throat:     Pharynx: No oropharyngeal exudate.  Eyes:     General: Lids are normal.     Conjunctiva/sclera: Conjunctivae normal.  Cardiovascular:     Rate and Rhythm: Normal rate and regular rhythm.     Heart sounds: Normal heart sounds, S1 normal and S2 normal.  Pulmonary:     Breath sounds: No decreased breath sounds, wheezing, rhonchi or rales.  Abdominal:     Palpations: Abdomen is soft.     Tenderness: There is no abdominal tenderness.  Musculoskeletal:     Right lower leg: No swelling.     Left lower leg: No swelling.  Skin:    General: Skin is warm.     Findings: No rash.  Neurological:     Mental Status: He is alert.     Comments: Answers all questions appropriately.  Power 4+ out of 5 upper and lower extremities bilaterally.     Data Reviewed: Creatinine 1.22, hemoglobin 8.3  Family Communication: Spoke with wife on the phone  Disposition: Status is: Inpatient Remains inpatient appropriate because: Patient still very weak.  Continue working with physical therapy.  Continue to monitor orthostatics after  starting midodrine.  Planned Discharge Destination: Skilled nursing facility    Time spent:28 minutes  Author: Alford Highland, MD 10/04/2022 1:52 PM  For on call review [email or web address].

## 2022-10-04 NOTE — Plan of Care (Signed)
  Problem: Education: Goal: Knowledge of disease or condition will improve Outcome: Progressing   

## 2022-10-04 NOTE — Progress Notes (Signed)
Physical Therapy Treatment Patient Details Name: Micheal Donovan MRN: [id number] DOB: 1950-09-12 Today's Date: 10/04/2022   History of Present Illness Pt is a 72 year old male presenting with LLE weakness and aphasia; imaging showed likely chronic hemorrhagic changes with calcification. Pmh significant for stage IV lung cancer with mets to the bone, history of cerebral microhemorrhage, history of PE, type 2 diabetes, hypertension, lumbar radiculopathy, BPH, hyperlipidemia, Chronic L2 compression fracture, stable    PT Comments  Patient in recliner upon arrival with wife at bedside, seems lethargic but agreeable to PT session this date. Wife is at bedside. BP 100/55 seated with patient asymptomatic at start of session. Patient able to stand from recliner with RW with supervision, and ambulate 30 ft with CGA and RW. Limited due to lethargy and fatigue this session but demonstrating progress with PT services. Vitals remained stable during session. Patient continue to require verbal cues and increased time for processing throughout session but responding well. Will continue to benefit from skilled acute PT services to address impairments and maximize functional mobility. Will continue to follow acutely.    If plan is discharge home, recommend the following: A little help with walking and/or transfers;A little help with bathing/dressing/bathroom;Assistance with cooking/housework;Assist for transportation;Help with stairs or ramp for entrance;Direct supervision/assist for medications management;Direct supervision/assist for financial management   Can travel by private vehicle     No  Equipment Recommendations  None recommended by PT    Recommendations for Other Services       Precautions / Restrictions Precautions Precautions: Fall Restrictions Weight Bearing Restrictions: No     Mobility  Bed Mobility               General bed mobility comments: NA - patient recieved and left in  recliner    Transfers Overall transfer level: Needs assistance Equipment used: Rolling walker (2 wheels) Transfers: Sit to/from Stand Sit to Stand: Supervision           General transfer comment: No physical assistance required; supervision for safety. BP stable with mobility, denies lightheadedness with activity    Ambulation/Gait Ambulation/Gait assistance: Min guard Gait Distance (Feet): 30 Feet Assistive device: Rolling walker (2 wheels) Gait Pattern/deviations: Step-through pattern, Decreased step length - right, Decreased step length - left Gait velocity: Decreased     General Gait Details: Completed ambulation with RW approx 30 feet and CGA throughout. Forward posture (reports this is baseline due to back pain). Slow gait speed noted.   Stairs             Wheelchair Mobility     Tilt Bed    Modified Rankin (Stroke Patients Only)       Balance           Standing balance support: Bilateral upper extremity supported, During functional activity, Reliant on assistive device for balance Standing balance-Leahy Scale: Good                              Cognition Arousal/Alertness: Awake/alert Behavior During Therapy: WFL for tasks assessed/performed Overall Cognitive Status: Within Functional Limits for tasks assessed                                 General Comments: Slow to responsd throughout session        Exercises      General Comments General comments (skin integrity, edema, etc.):  BP seated: 105/55, HR: 72 patient asymptomatic. Denies symptoms with mobility. BP stable after ambulation: BP: 100/53      Pertinent Vitals/Pain Pain Assessment Pain Assessment: 0-10 Pain Score: 6  Pain Location: back and feet (5/10 pain in back and 6/10 in feet, per pt report) Pain Descriptors / Indicators: Aching Pain Intervention(s): Limited activity within patient's tolerance, Monitored during session    Home Living                           Prior Function            PT Goals (current goals can now be found in the care plan section) Acute Rehab PT Goals PT Goal Formulation: With patient Time For Goal Achievement: 10/16/22 Potential to Achieve Goals: Good Progress towards PT goals: Progressing toward goals    Frequency    Min 1X/week      PT Plan Current plan remains appropriate    Co-evaluation              AM-PAC PT "6 Clicks" Mobility   Outcome Measure  Help needed turning from your back to your side while in a flat bed without using bedrails?: A Little Help needed moving from lying on your back to sitting on the side of a flat bed without using bedrails?: A Little Help needed moving to and from a bed to a chair (including a wheelchair)?: A Little Help needed standing up from a chair using your arms (e.g., wheelchair or bedside chair)?: A Little Help needed to walk in hospital room?: A Little Help needed climbing 3-5 steps with a railing? : A Lot 6 Click Score: 17    End of Session Equipment Utilized During Treatment: Gait belt Activity Tolerance: Patient tolerated treatment well Patient left: in chair;with family/visitor present Nurse Communication: Mobility status PT Visit Diagnosis: Other abnormalities of gait and mobility (R26.89);Difficulty in walking, not elsewhere classified (R26.2);Muscle weakness (generalized) (M62.81)     Time: 1610-9604 PT Time Calculation (min) (ACUTE ONLY): 16 min  Charges:    $Gait Training: 8-22 mins PT General Charges $$ ACUTE PT VISIT: 1 Visit                     Howie Ill, PT, DPT 10/04/22 12:35 PM

## 2022-10-04 NOTE — Progress Notes (Signed)
Occupational Therapy Treatment Patient Details Name: Micheal Donovan MRN: [id number] DOB: Oct 26, 1950 Today's Date: 10/04/2022   History of present illness Pt is a 72 year old male presenting with LLE weakness and aphasia; imaging showed likely chronic hemorrhagic changes with calcification. Pmh significant for stage IV lung cancer with mets to the bone, history of cerebral microhemorrhage, history of PE, type 2 diabetes, hypertension, lumbar radiculopathy, BPH, hyperlipidemia, Chronic L2 compression fracture, stable   OT comments  Pt received semi-reclined in bed; wife in room and supportive during session. Appearing alert; willing to work with OT on walking to sink and grooming. Orthostatics taken in semi-reclined and seated; asymptomatic; See flowsheet below for further details of session. Left seated in recliner, wife in room, chair alarm on, with all needs in reach.  Patient will benefit from continued OT while in acute care.    Recommendations for follow up therapy are one component of a multi-disciplinary discharge planning process, led by the attending physician.  Recommendations may be updated based on patient status, additional functional criteria and insurance authorization.    Assistance Recommended at Discharge Intermittent Supervision/Assistance  Patient can return home with the following  A little help with walking and/or transfers;A little help with bathing/dressing/bathroom;Assistance with cooking/housework;Assist for transportation;Help with stairs or ramp for entrance   Equipment Recommendations  None recommended by OT    Recommendations for Other Services      Precautions / Restrictions Precautions Precautions: Fall Restrictions Weight Bearing Restrictions: No       Mobility Bed Mobility Overal bed mobility: Modified Independent Bed Mobility: Supine to Sit     Supine to sit: Modified independent (Device/Increase time), HOB elevated     General bed mobility  comments: much increased time, but no assist needed    Transfers Overall transfer level: Needs assistance Equipment used: Rolling walker (2 wheels) Transfers: Sit to/from Stand Sit to Stand: Supervision                 Balance Overall balance assessment: Mild deficits observed, not formally tested (used RW during session)                                         ADL either performed or assessed with clinical judgement   ADL Overall ADL's : Needs assistance/impaired     Grooming: Oral care;Brushing hair;Set up;Min guard;Standing Grooming Details (indicate cue type and reason): standing at sink, SBA-CGA. Stood for approx 5 minutes for grooming; leaning forward with L arm on the countertop, pt states 2/2 back pain.             Lower Body Dressing: Total assistance Lower Body Dressing Details (indicate cue type and reason): OT assisted with donning BIL socks; pt states wife does this for him at baseline             Functional mobility during ADLs: Min guard;Rolling walker (2 wheels) (with gait belt for safety) General ADL Comments: OT managed IV pole and condom catheter bag during mobility to the sink and back to the chair; total ambulation during session approx 20 feet.    Extremity/Trunk Assessment Upper Extremity Assessment Upper Extremity Assessment: Generalized weakness   Lower Extremity Assessment Lower Extremity Assessment: Generalized weakness        Vision       Perception     Praxis      Cognition Arousal/Alertness: Awake/alert Behavior During  Therapy: WFL for tasks assessed/performed Overall Cognitive Status: Within Functional Limits for tasks assessed                                 General Comments: Pt is oriented; slow to respond, but follows all cues appropriately.        Exercises      Shoulder Instructions       General Comments BP taken in semi-reclined in L upper arm (IV going in R forearm)  97/57, HR 61. BP in sitting 110/53, HR 73. Pt reporting not dizzy or lightheaded; able to stand and walk to sink and continues to deny symptoms so BP in standing deferred.    Pertinent Vitals/ Pain       Pain Assessment Pain Assessment: 0-10 Pain Score: 4  Pain Location: back and feet (4/10 pain in back and 5/10 in feet, per pt report) Pain Descriptors / Indicators: Aching Pain Intervention(s): Limited activity within patient's tolerance, Monitored during session  Home Living                                          Prior Functioning/Environment              Frequency  Min 1X/week        Progress Toward Goals  OT Goals(current goals can now be found in the care plan section)  Progress towards OT goals: Progressing toward goals  Acute Rehab OT Goals Patient Stated Goal: Get better OT Goal Formulation: With patient/family Time For Goal Achievement: 10/16/22 Potential to Achieve Goals: Good ADL Goals Pt Will Perform Grooming: with modified independence Pt Will Perform Lower Body Dressing: with min assist Pt Will Transfer to Toilet: with min assist Pt Will Perform Toileting - Clothing Manipulation and hygiene: with min assist  Plan Discharge plan remains appropriate    Co-evaluation                 AM-PAC OT "6 Clicks" Daily Activity     Outcome Measure   Help from another person eating meals?: None Help from another person taking care of personal grooming?: A Little Help from another person toileting, which includes using toliet, bedpan, or urinal?: A Lot Help from another person bathing (including washing, rinsing, drying)?: A Little Help from another person to put on and taking off regular upper body clothing?: A Little Help from another person to put on and taking off regular lower body clothing?: A Lot 6 Click Score: 17    End of Session Equipment Utilized During Treatment: Gait belt;Rolling walker (2 wheels);Other (comment) (BP  machine)  OT Visit Diagnosis: Other abnormalities of gait and mobility (R26.89);Unsteadiness on feet (R26.81);Muscle weakness (generalized) (M62.81)   Activity Tolerance Patient tolerated treatment well   Patient Left in chair;with call bell/phone within reach;with chair alarm set;with family/visitor present   Nurse Communication Mobility status        Time: 3664-4034 OT Time Calculation (min): 34 min  Charges: OT General Charges $OT Visit: 1 Visit OT Treatments $Self Care/Home Management : 23-37 mins  Linward Foster, MS, OTR/L  Alvester Morin 10/04/2022, 11:26 AM

## 2022-10-04 NOTE — Plan of Care (Signed)
  Problem: Ischemic Stroke/TIA Tissue Perfusion: Goal: Complications of ischemic stroke/TIA will be minimized Outcome: Progressing   

## 2022-10-05 DIAGNOSIS — I951 Orthostatic hypotension: Secondary | ICD-10-CM | POA: Diagnosis not present

## 2022-10-05 DIAGNOSIS — I10 Essential (primary) hypertension: Secondary | ICD-10-CM

## 2022-10-05 DIAGNOSIS — R569 Unspecified convulsions: Secondary | ICD-10-CM | POA: Diagnosis not present

## 2022-10-05 DIAGNOSIS — C3412 Malignant neoplasm of upper lobe, left bronchus or lung: Secondary | ICD-10-CM | POA: Diagnosis not present

## 2022-10-05 DIAGNOSIS — R531 Weakness: Secondary | ICD-10-CM | POA: Diagnosis not present

## 2022-10-05 LAB — GLUCOSE, CAPILLARY
Glucose-Capillary: 108 mg/dL — ABNORMAL HIGH (ref 70–99)
Glucose-Capillary: 102 mg/dL — ABNORMAL HIGH (ref 70–99)
Glucose-Capillary: 129 mg/dL — ABNORMAL HIGH (ref 70–99)
Glucose-Capillary: 157 mg/dL — ABNORMAL HIGH (ref 70–99)

## 2022-10-05 NOTE — Progress Notes (Addendum)
Speech Language Pathology Treatment: Cognitive-Linquistic  Patient Details Name: Micheal Donovan MRN: [id number] DOB: 10-16-1950 Today's Date: 10/05/2022 Time: 4098-1191 SLP Time Calculation (min) (ACUTE ONLY): 15 min  Assessment / Plan / Recommendation Clinical Impression  Pt seen for cognitive-linguistic tx. Pt alert, pleasant, and cooperative. Wife present for last few minutes of tx. Pt demonstrated intact functional auditory comprehension for complex yes/no questions and 2-step commands. Pt also verbalized safe solutions to routine problems without difficulty. Pt's speech was fluent, appropriate, and without s/sx anomia or dysarthria. Improved timeliness of verbal and motor responses. Improving insight as pt acknowledge that he would benefit from rehab at d/c. All goals met this date. Pt would benefit from higher level cognitive-linguistic assessment at next level of care. Pt's cognitive-linguistic functioning is appropriate for hospital setting. Pt and wife made aware of progress to date, SLP recommendations, and POC. Both verbalized understanding/agreement.    HPI HPI: Pt is  72 y.o. male with who presented to [facility] ED on 7/31 with weakness, difficulty with ambulation, and intermittent episodes of staring off and aphadia. Stroke work up initiated. Pt with medical history significant of multiple medical issues including stage IV lung cancer with mets to the bone, history of cerebral microhemorrhage, history of PE, type 2 diabetes, hypertension, lumbar radiculopathy, BPH, hyperlipidemia presenting with left lower extremity weakness and intermittent aphasia. CT imaging with evolving hemorrhage of left cerebral hemisphere.  Frothy secretions in the right sphenoid sinus concerning for sinusitis.  CT angio chest negative for PE. Noted Progressive bilateral proximal cervical ICA stenoses, 75% or greater on the left and 60% on the right, moderate to severe stenosis of the left vertebral artery origin and severe  multifocal stenoses and/or segmental occlusions of the left V4 segment. EKG NSR w/ RBBB.      SLP Plan  All goals met      Recommendations for follow up therapy are one component of a multi-disciplinary discharge planning process, led by the attending physician.  Recommendations may be updated based on patient status, additional functional criteria and insurance authorization.    Recommendations                         Intermittent Supervision/Assistance Cognitive communication deficit (Y78.295)     All goals met    Clyde Canterbury, M.S., CCC-SLP Speech-Language Pathologist [facility] [phone] Arnette Felts)  Woodroe Chen  10/05/2022, 10:46 AM

## 2022-10-05 NOTE — Progress Notes (Signed)
Progress Note   Patient: Micheal Donovan DOB: 27-Feb-1951 DOA: 10/01/2022     4 DOS: the patient was seen and examined on 10/05/2022   Brief hospital course: 72 y.o. male with medical history significant of multiple medical issues including stage IV lung cancer with mets to the bone, history of cerebral microhemorrhage, history of PE, type 2 diabetes, hypertension, lumbar radiculopathy, BPH, hyperlipidemia presenting with left lower extremity weakness and intermittent aphasia.  History from patient as well as his family.  Per report, patient with acute on chronic left lower extremity weakness.  Weakness has been more predominant over the past 2 weeks.  Patient had significant episode of difficulty with ambulation over the past 24 hours.  Also with intermittent episodes of patient staring off as well as aphasia.  Noted to be followed by Dr. Cathie Hoops outpatient with oncology.  Recently completed course of chemotherapy-Keytruda.  No chest pain or shortness of breath.  No nausea or vomiting.  Weakness appears to be chronic issue.  No reported headache, vision changes. Presented to the ER afebrile, hemodynamically stable.  White count 4.9, hemoglobin 10.2, creatinine 123, sodium 130.  Troponin 1 37-1 17.  CT imaging with evolving hemorrhage of left cerebral hemisphere.  Frothy secretions in the right sphenoid sinus concerning for sinusitis.  CT angio chest negative for PE. Noted Progressive bilateral proximal cervical ICA stenoses, 75% or greater on the left and 60% on the right, moderate to severe stenosis of the left vertebral artery origin and severe multifocal stenoses and/or segmental occlusions of the left V4 segment. EKG NSR w/ RBBB.   8/1.  Patient feels total body weakness worse with the left leg.  Neurology starting Keppra and ordering an EEG.  Physical therapy recommends rehab. 8/2.  Patient orthostatic with physical therapy evaluation.  Patient still feels weak worse with the left leg. 8/3.   Patient did better with physical therapy today.  Still complains of weakness worse with his left leg.  Has chronic pain in his legs. 8/4.  Patient feeling better and interested in going home.  Assessment and Plan: * Orthostatic hypotension Continue midodrine 10 mg 3 times daily.  TED hose.  Patient received a steroid injection last week so I cannot order a a.m. cortisol at this point.  Continue working with physical therapy.  Discontinue fluid.  Hold Flomax and irbesartan.  Seizure [facility]) Neurology starting Keppra.  EEG negative but neurology still recommends continuing Keppra as outpatient.  Weakness Noted worsening baseline weakness of left lower extremity in addition to mild confusion and aphasia Baseline history of hemorrhagic CVA, lung cancer with metastatic disease Physical therapy recommending rehab  Primary non-small cell carcinoma of upper lobe of left lung (HCC) Followed by Dr. Cathie Hoops outpatient.  Receiving Keytruda as outpatient. 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   Sinusitis Placed on doxycycline by admitting physician  Malnutrition of moderate degree Continue supplements.  Myocardial ischemia Likely demand ischemia with stroke workup.  Recent echo showed a normal EF.  No complaints of chest pain.  NSTEMI ruled out.  (HFpEF) heart failure with preserved ejection fraction [facility]) 2D echo April 2024 with EF of 55 to 60% and grade 1 diastolic dysfunction Chronic in nature.  No signs of heart failure with hydration.  Anemia of chronic disease Hemoglobin down to 8.3.  Check hemoglobin tomorrow.  Discontinue fluids today.  Pulmonary embolus [facility]) Prior history  of pulmonary embolism.  Not on anticoagulation secondary to brain bleed.  Type 2 diabetes mellitus with hyperlipidemia (HCC) Hemoglobin A1c 6.6.  Sliding scale insulin.  Holding Lipitor.  Type 2 diabetes  mellitus with hyperglycemia (HCC) Hemoglobin A1c 6.6.  On sliding scale insulin.  Carotid stenosis, bilateral Noted progressive bilateral proximal cervical ICA stenoses, 75% or greater on the left and 60% on the right. Moderate to severe stenosis of the left vertebral artery origin and severe multifocal stenoses and/or segmental occlusions of the left V4 segment noted on imaging.     HYPERCHOLESTEROLEMIA Hold Lipitor just in case contributing to weakness.        Subjective: Patient feeling better and he is actually interested in going home.  Patient's wife is interested in potentially acute inpatient rehab since he is doing better than a few days ago.  Physical Exam: Vitals:   10/04/22 1653 10/04/22 2011 10/05/22 0413 10/05/22 0807  BP: 114/61 (!) 102/45 139/66 (!) 107/55  Pulse: 75 64 66 62  Resp:  16 18 12   Temp:  98.2 F (36.8 C) 98.5 F (36.9 C) 98.6 F (37 C)  TempSrc:   Oral Oral  SpO2:  98% 100% 100%  Weight:      Height:       Physical Exam HENT:     Head: Normocephalic.     Mouth/Throat:     Pharynx: No oropharyngeal exudate.  Eyes:     General: Lids are normal.     Conjunctiva/sclera: Conjunctivae normal.  Cardiovascular:     Rate and Rhythm: Normal rate and regular rhythm.     Heart sounds: Normal heart sounds, S1 normal and S2 normal.  Pulmonary:     Breath sounds: No decreased breath sounds, wheezing, rhonchi or rales.  Abdominal:     Palpations: Abdomen is soft.     Tenderness: There is no abdominal tenderness.  Musculoskeletal:     Right lower leg: No swelling.     Left lower leg: No swelling.  Skin:    General: Skin is warm.     Findings: No rash.  Neurological:     Mental Status: He is alert.     Comments: Answers all questions appropriately.  Power 4+ out of 5 upper and lower extremities bilaterally.     Data Reviewed: Creatinine 1.22, last hemoglobin 8.3  Family Communication: Spoke with patient's wife on the  phone  Disposition: Status is: Inpatient Remains inpatient appropriate because: Patient's wife still interested in potential acute inpatient rehab since the patient has improved since coming in.  Will see how he does with physical therapy tomorrow  Planned Discharge Destination: Physical therapy still recommending rehab.  Patient interested in going home.  Patient's wife is interested in acute inpatient rehab.    Time spent: 27 minutes  Author: Alford Highland, MD 10/05/2022 12:40 PM  For on call review [email or web address].

## 2022-10-06 DIAGNOSIS — R531 Weakness: Secondary | ICD-10-CM | POA: Diagnosis not present

## 2022-10-06 DIAGNOSIS — I951 Orthostatic hypotension: Secondary | ICD-10-CM | POA: Diagnosis not present

## 2022-10-06 DIAGNOSIS — Z794 Long term (current) use of insulin: Secondary | ICD-10-CM

## 2022-10-06 DIAGNOSIS — C3412 Malignant neoplasm of upper lobe, left bronchus or lung: Secondary | ICD-10-CM | POA: Diagnosis not present

## 2022-10-06 DIAGNOSIS — R569 Unspecified convulsions: Secondary | ICD-10-CM | POA: Diagnosis not present

## 2022-10-06 LAB — GLUCOSE, CAPILLARY
Glucose-Capillary: 96 mg/dL (ref 70–99)
Glucose-Capillary: 151 mg/dL — ABNORMAL HIGH (ref 70–99)
Glucose-Capillary: 164 mg/dL — ABNORMAL HIGH (ref 70–99)

## 2022-10-06 MED ORDER — ADULT MULTIVITAMIN W/MINERALS CH: 1.0000 | ORAL_TABLET | Freq: Every day | ORAL | Status: DC

## 2022-10-06 MED ORDER — LEVETIRACETAM 500 MG PO TABS: 500.0000 mg | ORAL_TABLET | Freq: Two times a day (BID) | ORAL | 0 refills | Status: DC

## 2022-10-06 MED ORDER — OXYCODONE HCL 15 MG PO TABS: 15.0000 mg | ORAL_TABLET | Freq: Four times a day (QID) | ORAL | 0 refills | Status: AC | PRN

## 2022-10-06 MED ORDER — POLYETHYLENE GLYCOL 3350 17 G PO PACK: 17.0000 g | PACK | Freq: Every day | ORAL | Status: DC

## 2022-10-06 MED ORDER — FINASTERIDE 5 MG PO TABS: 5.0000 mg | ORAL_TABLET | Freq: Every day | ORAL | 0 refills | Status: DC

## 2022-10-06 MED ORDER — MIDODRINE HCL 10 MG PO TABS: 10.0000 mg | ORAL_TABLET | Freq: Three times a day (TID) | ORAL | 0 refills | Status: DC

## 2022-10-06 MED ORDER — DOCUSATE SODIUM 100 MG PO CAPS
100.0000 mg | ORAL_CAPSULE | Freq: Two times a day (BID) | ORAL | Status: DC
  Administered 2022-10-06: 100 mg via ORAL
  Filled 2022-10-06: qty 1

## 2022-10-06 NOTE — Plan of Care (Signed)
  Problem: Education: Goal: Knowledge of disease or condition will improve 10/06/2022 1643 by Raeford Razor, RN Outcome: Progressing 10/06/2022 1642 by Raeford Razor, RN Outcome: Progressing Goal: Knowledge of secondary prevention will improve (MUST DOCUMENT ALL) 10/06/2022 1643 by Raeford Razor, RN Outcome: Progressing 10/06/2022 1642 by Raeford Razor, RN Outcome: Progressing Goal: Knowledge of patient specific risk factors will improve Loraine Leriche N/A or DELETE if not current risk factor) 10/06/2022 1643 by Raeford Razor, RN Outcome: Progressing 10/06/2022 1642 by Raeford Razor, RN Outcome: Progressing   Problem: Ischemic Stroke/TIA Tissue Perfusion: Goal: Complications of ischemic stroke/TIA will be minimized 10/06/2022 1643 by Raeford Razor, RN Outcome: Progressing 10/06/2022 1642 by Raeford Razor, RN Outcome: Progressing   Problem: Coping: Goal: Will verbalize positive feelings about self 10/06/2022 1643 by Raeford Razor, RN Outcome: Progressing 10/06/2022 1642 by Raeford Razor, RN Outcome: Progressing Goal: Will identify appropriate support needs 10/06/2022 1643 by Raeford Razor, RN Outcome: Progressing 10/06/2022 1642 by Raeford Razor, RN Outcome: Progressing

## 2022-10-06 NOTE — Progress Notes (Signed)
Occupational Therapy Treatment Patient Details Name: Micheal Donovan MRN: [id number] DOB: 1950-03-08 Today's Date: 10/06/2022   History of present illness Pt is a 72 year old male presenting with LLE weakness and aphasia; imaging showed likely chronic hemorrhagic changes with calcification. Pmh significant for stage IV lung cancer with mets to the bone, history of cerebral microhemorrhage, history of PE, type 2 diabetes, hypertension, lumbar radiculopathy, BPH, hyperlipidemia, Chronic L2 compression fracture, stable   OT comments  Pt seen for OT treatment on this date. Upon arrival to room pt, in recliner with spouse present, agreeable to OT session. Discussed CLOF and PLOF with future therapy needs with pt/spouse. Pt requires no physical assistance to stand from recliner, close supervision for safety. Pt dons shoes with setup and increased time (this is his baseline), and completes bout of functional mobility ~100 ft in hallway with OT managing cath bag. Pt required multimodal cuing for upright posture and RW management. Pt denies pain throughout tx. Slow cadence throughout with OT providing CGA d/t pt's concern of LLE buckling. Pt motivated, would greatly benefit from PT post-acute d/c to decrease risk of falls and improve functional mobility. Pt and spouse are hopeful for pt to d/c to CIR prior to returning home. Pt presents with 5/5 UE strength bilaterally, and appears close to baseline for ADL performance. Pt making good progress toward goals, will continue to follow POC. Discharge recommendation remains appropriate.     Recommendations for follow up therapy are one component of a multi-disciplinary discharge planning process, led by the attending physician.  Recommendations may be updated based on patient status, additional functional criteria and insurance authorization.    Assistance Recommended at Discharge Intermittent Supervision/Assistance  Patient can return home with the following  A little  help with walking and/or transfers;A little help with bathing/dressing/bathroom;Assistance with cooking/housework;Assist for transportation;Help with stairs or ramp for entrance   Equipment Recommendations  None recommended by OT    Recommendations for Other Services      Precautions / Restrictions Precautions Precautions: Fall;Back;Other (comment) Precaution Booklet Issued:  (Pt educated on log rolling to prevent pain) Restrictions Weight Bearing Restrictions: No       Mobility Bed Mobility                    Transfers Overall transfer level: Needs assistance Equipment used: Rolling walker (2 wheels) Transfers: Sit to/from Stand Sit to Stand: Supervision           General transfer comment: No physical assistance required, close supervision for safety     Balance Overall balance assessment: Mild deficits observed, not formally tested Sitting-balance support: Feet supported, No upper extremity supported       Standing balance support: Bilateral upper extremity supported, During functional activity, Reliant on assistive device for balance Standing balance-Leahy Scale: Good                             ADL either performed or assessed with clinical judgement   ADL                       Lower Body Dressing: Set up (this is pt's baseline)               Functional mobility during ADLs: Min guard;Rolling walker (2 wheels) General ADL Comments: Pt completes functional mobility ~119ft   around unit with CGA and gaitbelt for safety.    Extremity/Trunk Assessment  Cognition Arousal/Alertness: Awake/alert Behavior During Therapy: WFL for tasks assessed/performed Overall Cognitive Status: Within Functional Limits for tasks assessed                                          Exercises Exercises: Other exercises (BUE 5/5 MMT)       General Comments  (Pt and spouse educated on benefits of continued PT  services post d/c)    Pertinent Vitals/ Pain       Pain Assessment Pain Assessment: No/denies pain         Frequency  Min 1X/week        Progress Toward Goals  OT Goals(current goals can now be found in the care plan section)  Progress towards OT goals: Progressing toward goals  Acute Rehab OT Goals OT Goal Formulation: With patient/family Time For Goal Achievement: 10/16/22 Potential to Achieve Goals: Good  Plan Discharge plan remains appropriate       AM-PAC OT "6 Clicks" Daily Activity     Outcome Measure   Help from another person eating meals?: None Help from another person taking care of personal grooming?: A Little Help from another person toileting, which includes using toliet, bedpan, or urinal?: A Little Help from another person bathing (including washing, rinsing, drying)?: A Little Help from another person to put on and taking off regular upper body clothing?: A Little Help from another person to put on and taking off regular lower body clothing?: A Little 6 Click Score: 19    End of Session Equipment Utilized During Treatment: Gait belt;Rolling walker (2 wheels)  OT Visit Diagnosis: Other abnormalities of gait and mobility (R26.89);Unsteadiness on feet (R26.81);Muscle weakness (generalized) (M62.81)   Activity Tolerance Patient tolerated treatment well   Patient Left in chair;with call bell/phone within reach;with chair alarm set;with family/visitor present   Nurse Communication          Time: 2130-8657 OT Time Calculation (min): 34 min  Charges: OT General Charges $OT Visit: 1 Visit OT Treatments $Therapeutic Activity: 23-37 mins   L. , OTR/L  10/06/22, 4:26 PM

## 2022-10-06 NOTE — Progress Notes (Signed)
Physical Therapy Treatment Patient Details Name: Micheal Donovan MRN: [id number] DOB: [birth year] Today's Date: 10/06/2022   History of Present Illness Pt is a 72 year old male presenting with LLE weakness and aphasia; imaging showed likely chronic hemorrhagic changes with calcification. Pmh significant for stage IV lung cancer with mets to the bone, history of cerebral microhemorrhage, history of PE, type 2 diabetes, hypertension, lumbar radiculopathy, BPH, hyperlipidemia, Chronic L2 compression fracture, stable    PT Comments  Pt received in bed, had been up in chair earlier. Discussed current LOF with pt and spouse. Pt presents with L LE weakness requiring assist to complete full end ROM exercises in supine. Pt transfers to EOB with HOB raised with CGA. Min/ModA from flat surface. Minimal c/o LBP in sitting, Sit<>stand with CGA. Pt demonstrated 76ft of gait training with RW, slow cadence, decreased step length bilaterally providing CGA due to hx of L LE buckling. Pt remains very motivated to participate and improve functional mobility. Pt will benefit from continued PT post d/c. Pt's spouse was recently at [facility] and hoping pt can transition there prior to returning home.    If plan is discharge home, recommend the following: A little help with walking and/or transfers;A little help with bathing/dressing/bathroom;Assistance with cooking/housework;Assist for transportation;Help with stairs or ramp for entrance;Direct supervision/assist for medications management;Direct supervision/assist for financial management   Can travel by private vehicle     No  Equipment Recommendations  None recommended by PT (Pt has a RW and w/c at home)    Recommendations for Other Services       Precautions / Restrictions Precautions Precautions: Fall;Back;Other (comment) (Chronic L2 compression fx) Precaution Booklet Issued:  (Pt educated on log rolling to prevent pain) Restrictions Weight Bearing Restrictions:  No     Mobility  Bed Mobility Overal bed mobility: Needs Assistance Bed Mobility: Supine to Sit     Supine to sit: Min guard, HOB elevated (Slow)     General bed mobility comments: Increased time to complete, Min/ModA assist from flat bed    Transfers Overall transfer level: Needs assistance Equipment used: Rolling walker (2 wheels) Transfers: Sit to/from Stand Sit to Stand: Min guard           General transfer comment:  (Increased time to complete, good demonstration of technique)    Ambulation/Gait Ambulation/Gait assistance: Min guard Gait Distance (Feet):  (50) Assistive device: Rolling walker (2 wheels) Gait Pattern/deviations: Step-through pattern, Decreased step length - right, Decreased step length - left, Trunk flexed Gait velocity: Decreased     General Gait Details: Completed ambulation with RW approx 50 feet and CGA throughout. Forward posture (reports this is baseline due to back pain). Slow gait speed noted.   Stairs Stairs:  (Pt has a ramp)           Wheelchair Mobility     Tilt Bed    Modified Rankin (Stroke Patients Only)       Balance Overall balance assessment: Mild deficits observed, not formally tested                                          Cognition Arousal/Alertness: Awake/alert Behavior During Therapy: WFL for tasks assessed/performed Overall Cognitive Status: Within Functional Limits for tasks assessed  General Comments: Slow to responsd throughout session, but very pleasant and cooperative        Exercises General Exercises - Lower Extremity Ankle Circles/Pumps: AROM, Both, 10 reps, Supine Heel Slides: AROM, Right, AAROM, Left, 10 reps, Supine Hip ABduction/ADduction: AROM, Right, AAROM, Left, Both, 10 reps, Supine    General Comments General comments (skin integrity, edema, etc.):  (Pt and spouse educated on benefits of continued PT services post  d/c)      Pertinent Vitals/Pain Pain Assessment Pain Assessment: 0-10 Pain Score: 3  Pain Location: back Pain Descriptors / Indicators: Aching Pain Intervention(s): Limited activity within patient's tolerance    Home Living                          Prior Function            PT Goals (current goals can now be found in the care plan section) Acute Rehab PT Goals Patient Stated Goal: Go to Acute Rehab Progress towards PT goals: Progressing toward goals    Frequency    Min 1X/week      PT Plan Current plan remains appropriate    Co-evaluation              AM-PAC PT "6 Clicks" Mobility   Outcome Measure  Help needed turning from your back to your side while in a flat bed without using bedrails?: A Little Help needed moving from lying on your back to sitting on the side of a flat bed without using bedrails?: A Little Help needed moving to and from a bed to a chair (including a wheelchair)?: A Little Help needed standing up from a chair using your arms (e.g., wheelchair or bedside chair)?: A Little Help needed to walk in hospital room?: A Little Help needed climbing 3-5 steps with a railing? : A Lot 6 Click Score: 17    End of Session Equipment Utilized During Treatment: Gait belt Activity Tolerance: Patient tolerated treatment well Patient left: in chair;with family/visitor present Nurse Communication: Mobility status PT Visit Diagnosis: Other abnormalities of gait and mobility (R26.89);Difficulty in walking, not elsewhere classified (R26.2);Muscle weakness (generalized) (M62.81)     Time: 5409-8119 PT Time Calculation (min) (ACUTE ONLY): 26 min  Charges:    $Gait Training: 8-22 mins $Therapeutic Exercise: 8-22 mins PT General Charges $$ ACUTE PT VISIT: 1 Visit                    Cevin Kutz, PTA  [name] 10/06/2022, 2:28 PM

## 2022-10-06 NOTE — Progress Notes (Addendum)
Progress Note   Patient: Micheal Donovan ZOX:[id number] DOB: 1950/08/05 DOA: 10/01/2022     5 DOS: the patient was seen and examined on 10/06/2022   Brief hospital course: 72 y.o. male with medical history significant of multiple medical issues including stage IV lung cancer with mets to the bone, history of cerebral microhemorrhage, history of PE, type 2 diabetes, hypertension, lumbar radiculopathy, BPH, hyperlipidemia presenting with left lower extremity weakness and intermittent aphasia.  History from patient as well as his family.  Per report, patient with acute on chronic left lower extremity weakness.  Weakness has been more predominant over the past 2 weeks.  Patient had significant episode of difficulty with ambulation over the past 24 hours.  Also with intermittent episodes of patient staring off as well as aphasia.  Noted to be followed by Dr. Cathie Hoops outpatient with oncology.  Recently completed course of chemotherapy-Keytruda.  No chest pain or shortness of breath.  No nausea or vomiting.  Weakness appears to be chronic issue.  No reported headache, vision changes. Presented to the ER afebrile, hemodynamically stable.  White count 4.9, hemoglobin 10.2, creatinine 123, sodium 130.  Troponin 1 37-1 17.  CT imaging with evolving hemorrhage of left cerebral hemisphere.  Frothy secretions in the right sphenoid sinus concerning for sinusitis.  CT angio chest negative for PE. Noted Progressive bilateral proximal cervical ICA stenoses, 75% or greater on the left and 60% on the right, moderate to severe stenosis of the left vertebral artery origin and severe multifocal stenoses and/or segmental occlusions of the left V4 segment. EKG NSR w/ RBBB.   8/1.  Patient feels total body weakness worse with the left leg.  Neurology starting Keppra and ordering an EEG.  Physical therapy recommends rehab. 8/2.  Patient orthostatic with physical therapy evaluation.  Patient still feels weak worse with the left leg. 8/3.   Patient did better with physical therapy today.  Still complains of weakness worse with his left leg.  Has chronic pain in his legs. 8/4.  Patient feeling better and interested in going home. 8/5.  Walked better with physical therapy.  Waiting to hear back if he is a candidate for CIR.  Assessment and Plan: * Orthostatic hypotension Continue midodrine 10 mg 3 times daily.  TED hose.  Patient received a steroid injection last week so I cannot order a a.m. cortisol at this point.  Continue working with physical therapy.  Hold Flomax and irbesartan.  Seizure [facility]) Neurology starting Keppra.  EEG negative but neurology still recommends continuing Keppra as outpatient.  Weakness Noted worsening baseline weakness of left lower extremity in addition to mild confusion and aphasia Baseline history of hemorrhagic CVA, lung cancer with metastatic disease Physical therapy recommending rehab.  Patient did better today with physical therapy and family interested to see if he is a candidate for acute inpatient rehab.  Primary non-small cell carcinoma of upper lobe of left lung (HCC) Followed by Dr. Cathie Hoops outpatient.  Receiving Keytruda as outpatient. 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   Sinusitis Placed on doxycycline by admitting physician  Malnutrition of moderate degree Continue supplements.  Myocardial ischemia Likely demand ischemia with stroke workup.  Recent echo showed a normal EF.  No complaints of chest pain.  NSTEMI ruled out.  (HFpEF) heart failure with preserved ejection fraction [facility]) 2D echo April 2024 with EF of 55 to 60% and  grade 1 diastolic dysfunction Chronic in nature.  No signs of heart failure with hydration.  Anemia of chronic disease Hemoglobin 9.1.   Pulmonary embolus (HCC) Prior history of pulmonary embolism.  Not on anticoagulation secondary to brain  bleed.  Type 2 diabetes mellitus with hyperlipidemia (HCC) Hemoglobin A1c 6.6.  Sliding scale insulin.  Holding Lipitor.  Type 2 diabetes mellitus with hyperglycemia (HCC) Hemoglobin A1c 6.6.  On sliding scale insulin.  Carotid stenosis, bilateral Noted progressive bilateral proximal cervical ICA stenoses, 75% or greater on the left and 60% on the right. Moderate to severe stenosis of the left vertebral artery origin and severe multifocal stenoses and/or segmental occlusions of the left V4 segment noted on imaging.     HYPERCHOLESTEROLEMIA Hold Lipitor just in case contributing to weakness.        Subjective: Patient still having left leg weakness.  Feels better with moving around with physical therapy than he did.  Admitted with left lower extremity weakness, total body weakness.  Found to have orthostatic hypotension.  Physical Exam: Vitals:   10/05/22 2012 10/06/22 0545 10/06/22 0740 10/06/22 1555  BP: (!) 109/55 (!) 105/49 112/61 110/67  Pulse: 65 61 63 80  Resp: 16 16 16 18   Temp: 98.3 F (36.8 C) 98.2 F (36.8 C)  98.1 F (36.7 C)  TempSrc: Oral Oral  Oral  SpO2: 98% 98% 98% 100%  Weight:      Height:       Physical Exam HENT:     Head: Normocephalic.     Mouth/Throat:     Pharynx: No oropharyngeal exudate.  Eyes:     General: Lids are normal.     Conjunctiva/sclera: Conjunctivae normal.  Cardiovascular:     Rate and Rhythm: Normal rate and regular rhythm.     Heart sounds: Normal heart sounds, S1 normal and S2 normal.  Pulmonary:     Breath sounds: No decreased breath sounds, wheezing, rhonchi or rales.  Abdominal:     Palpations: Abdomen is soft.     Tenderness: There is no abdominal tenderness.  Musculoskeletal:     Right lower leg: No swelling.     Left lower leg: No swelling.  Skin:    General: Skin is warm.     Findings: No rash.  Neurological:     Mental Status: He is alert.     Comments: Answers all questions appropriately.  Power 4/5 left  lower extremity.  Power 4+ out of 5 upper extremities and right lower extremity.     Data Reviewed: Creatinine 1.14, hemoglobin 9.1  Family Communication: Spoke with wife at the bedside  Disposition: Status is: Inpatient Remains inpatient appropriate because: Waiting to see if a candidate for acute inpatient rehab  Planned Discharge Destination: To be determined    Time spent: 28 minutes  Author: Alford Highland, MD 10/06/2022 4:47 PM  For on call review [email or web address].

## 2022-10-06 NOTE — Plan of Care (Signed)
  Problem: Health Behavior/Discharge Planning: Goal: Goals will be collaboratively established with patient/family Outcome: Progressing   Problem: Self-Care: Goal: Ability to participate in self-care as condition permits will improve Outcome: Progressing Goal: Verbalization of feelings and concerns over difficulty with self-care will improve Outcome: Progressing Goal: Ability to communicate needs accurately will improve Outcome: Progressing   Problem: Nutrition: Goal: Risk of aspiration will decrease Outcome: Progressing   Problem: Metabolic: Goal: Ability to maintain appropriate glucose levels will improve Outcome: Progressing   Problem: Education: Goal: Knowledge of General Education information will improve Description: Including pain rating scale, medication(s)/side effects and non-pharmacologic comfort measures Outcome: Progressing   Problem: Clinical Measurements: Goal: Respiratory complications will improve Outcome: Progressing   Problem: Activity: Goal: Risk for activity intolerance will decrease Outcome: Progressing   Problem: Coping: Goal: Level of anxiety will decrease Outcome: Progressing   Problem: Pain Managment: Goal: General experience of comfort will improve Outcome: Progressing   Problem: Safety: Goal: Ability to remain free from injury will improve Outcome: Progressing

## 2022-10-06 NOTE — Plan of Care (Signed)
  Problem: Education: Goal: Knowledge of disease or condition will improve Outcome: Progressing Goal: Knowledge of secondary prevention will improve (MUST DOCUMENT ALL) Outcome: Progressing Goal: Knowledge of patient specific risk factors will improve Micheal Donovan N/A or DELETE if not current risk factor) Outcome: Progressing   Problem: Ischemic Stroke/TIA Tissue Perfusion: Goal: Complications of ischemic stroke/TIA will be minimized Outcome: Progressing   Problem: Coping: Goal: Will verbalize positive feelings about self Outcome: Progressing Goal: Will identify appropriate support needs Outcome: Progressing

## 2022-10-06 NOTE — Progress Notes (Signed)
Pt discharged with no PIV to home. Discharge instructions were given as well as medication instructions . Pt understanding and pushed out by wheel chair by NT, Pt going home POV with wife.

## 2022-10-06 NOTE — Care Management Important Message (Signed)
Important Message  Patient Details  Name: Micheal Donovan MRN: [id number] Date of Birth: 16-Feb-1951   Medicare Important Message Given:  Yes     Johnell Comings 10/06/2022, 10:10 AM

## 2022-10-06 NOTE — Discharge Summary (Signed)
Physician Discharge Summary   Patient: Micheal Donovan MRN: [id number] DOB: 1951-01-11  Admit date:     10/01/2022  Discharge date: 10/06/22  Discharge Physician: Alford Highland   PCP: Donita Brooks, MD   Recommendations at discharge:   Follow-up PCP 5 days Follow-up oncology Follow-up outpatient neurology  Discharge Diagnoses: Principal Problem:   Orthostatic hypotension Active Problems:   Weakness   Seizure (HCC)   Primary non-small cell carcinoma of upper lobe of left lung (HCC)   Sinusitis   HYPERCHOLESTEROLEMIA   Carotid stenosis, bilateral   Type 2 diabetes mellitus with hyperglycemia (HCC)   Type 2 diabetes mellitus with hyperlipidemia (HCC)   Pulmonary embolus (HCC)   Essential hypertension   Anemia of chronic disease   (HFpEF) heart failure with preserved ejection fraction (HCC)   Myocardial ischemia   Malnutrition of moderate degree    Hospital Course: 72 y.o. male with medical history significant of multiple medical issues including stage IV lung cancer with mets to the bone, history of cerebral microhemorrhage, history of PE, type 2 diabetes, hypertension, lumbar radiculopathy, BPH, hyperlipidemia presenting with left lower extremity weakness and intermittent aphasia.  History from patient as well as his family.  Per report, patient with acute on chronic left lower extremity weakness.  Weakness has been more predominant over the past 2 weeks.  Patient had significant episode of difficulty with ambulation over the past 24 hours.  Also with intermittent episodes of patient staring off as well as aphasia.  Noted to be followed by Dr. Cathie Hoops outpatient with oncology.  Recently completed course of chemotherapy-Keytruda.  No chest pain or shortness of breath.  No nausea or vomiting.  Weakness appears to be chronic issue.  No reported headache, vision changes. Presented to the ER afebrile, hemodynamically stable.  White count 4.9, hemoglobin 10.2, creatinine 123, sodium 130.   Troponin 1 37-1 17.  CT imaging with evolving hemorrhage of left cerebral hemisphere.  Frothy secretions in the right sphenoid sinus concerning for sinusitis.  CT angio chest negative for PE. Noted Progressive bilateral proximal cervical ICA stenoses, 75% or greater on the left and 60% on the right, moderate to severe stenosis of the left vertebral artery origin and severe multifocal stenoses and/or segmental occlusions of the left V4 segment. EKG NSR w/ RBBB.   8/1.  Patient feels total body weakness worse with the left leg.  Neurology starting Keppra and ordering an EEG.  Physical therapy recommends rehab. 8/2.  Patient orthostatic with physical therapy evaluation.  Patient still feels weak worse with the left leg. 8/3.  Patient did better with physical therapy today.  Still complains of weakness worse with his left leg.  Has chronic pain in his legs. 8/4.  Patient feeling better and interested in going home. 8/5.  Walked better with physical therapy.  Did well with physical therapy.  Patient and wife okay going home this evening.  Will set up home health.  Assessment and Plan: * Orthostatic hypotension Continue midodrine 10 mg 3 times daily.  TED hose.  Patient received a steroid injection last week so I cannot order a a.m. cortisol at this point.  Continue working with physical therapy.  Hold Flomax and irbesartan.  Seizure [facility]) Neurology starting Keppra.  EEG negative but neurology still recommends continuing Keppra as outpatient.  Weakness Noted worsening baseline weakness of left lower extremity in addition to mild confusion and aphasia Baseline history of hemorrhagic CVA, lung cancer with metastatic disease Physical therapy recommending rehab.  Patient  did better today with physical therapy and Occupational Therapy today.  Patient and wife interested in going home with home health.  Primary non-small cell carcinoma of upper lobe of left lung (HCC) Followed by Dr. Cathie Hoops outpatient.   Receiving Keytruda as outpatient. 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   Sinusitis Placed on doxycycline by admitting physician.  Will discontinue upon discharge.  Malnutrition of moderate degree Continue supplements.  Myocardial ischemia Likely demand ischemia with stroke workup.  Recent echo showed a normal EF.  No complaints of chest pain.  NSTEMI ruled out.  (HFpEF) heart failure with preserved ejection fraction [facility]) 2D echo April 2024 with EF of 55 to 60% and grade 1 diastolic dysfunction Chronic in nature.  No signs of heart failure with hydration.  Anemia of chronic disease Hemoglobin 9.1.   Pulmonary embolus (HCC) Prior history of pulmonary embolism.  Not on anticoagulation secondary to brain bleed.  Type 2 diabetes mellitus with hyperlipidemia (HCC) Hemoglobin A1c 6.6.  Sliding scale insulin.  Holding Lipitor.  Type 2 diabetes mellitus with hyperglycemia (HCC) Hemoglobin A1c 6.6.  On sliding scale insulin.  Carotid stenosis, bilateral Noted progressive bilateral proximal cervical ICA stenoses, 75% or greater on the left and 60% on the right. Moderate to severe stenosis of the left vertebral artery origin and severe multifocal stenoses and/or segmental occlusions of the left V4 segment noted on imaging.     HYPERCHOLESTEROLEMIA Hold Lipitor just in case contributing to weakness.         Consultants: Neurology Procedures performed: EEG Disposition: Home health Diet recommendation:  Cardiac and Carb modified diet DISCHARGE MEDICATION: Allergies as of 10/06/2022       Reactions   Niaspan [niacin]    FLUSHING        Medication List     STOP taking these medications    atorvastatin 40 MG tablet Commonly known as: LIPITOR   folic acid 800 MCG tablet Commonly known as: FOLVITE   furosemide 20 MG tablet Commonly known as: LASIX    insulin glargine 100 UNIT/ML injection Commonly known as: LANTUS   oxyCODONE-acetaminophen 7.5-325 MG tablet Commonly known as: Percocet   potassium chloride 10 MEQ tablet Commonly known as: KLOR-CON M   solifenacin 10 MG tablet Commonly known as: VESICARE   tamsulosin 0.4 MG Caps capsule Commonly known as: FLOMAX   UNABLE TO FIND   valsartan 80 MG tablet Commonly known as: DIOVAN       TAKE these medications    aspirin EC 81 MG tablet Take 1 tablet (81 mg total) by mouth daily. Swallow whole.   docusate sodium 100 MG capsule Commonly known as: COLACE Take 100 mg by mouth 2 (two) times daily as needed for mild constipation.   fentaNYL 75 MCG/HR Commonly known as: DURAGESIC Place 1 patch onto the skin every 3 (three) days.   finasteride 5 MG tablet Commonly known as: PROSCAR Take 1 tablet (5 mg total) by mouth daily. Start taking on: October 07, 2022   gabapentin 300 MG capsule Commonly known as: NEURONTIN TAKE 1 CAPSULE(300 MG) BY MOUTH THREE TIMES DAILY   levETIRAcetam 500 MG tablet Commonly known as: KEPPRA Take 1 tablet (500 mg total) by mouth 2 (two) times daily.   megestrol 40 MG tablet Commonly known as: MEGACE Take 1 tablet (40 mg total) by mouth daily.   midodrine 10 MG tablet Commonly known  as: PROAMATINE Take 1 tablet (10 mg total) by mouth 3 (three) times daily with meals. Start taking on: October 07, 2022   multivitamin with minerals Tabs tablet Take 1 tablet by mouth daily. Start taking on: October 07, 2022   NovoLOG FlexPen 100 UNIT/ML FlexPen Generic drug: insulin aspart Inject 0-10 Units into the skin daily as needed for high blood sugar (BG > 200).   Omeprazole 20 MG Tbec Take 20 mg by mouth daily.   oxyCODONE 15 MG immediate release tablet Commonly known as: ROXICODONE Take 1 tablet (15 mg total) by mouth every 6 (six) hours as needed for up to 5 days for severe pain.   traZODone 50 MG tablet Commonly known as: DESYREL Take 1  tablet (50 mg total) by mouth at bedtime as needed for sleep.        Discharge Exam: Filed Weights   10/01/22 1300  Weight: 59.9 kg   Physical Exam HENT:     Head: Normocephalic.     Mouth/Throat:     Pharynx: No oropharyngeal exudate.  Eyes:     General: Lids are normal.     Conjunctiva/sclera: Conjunctivae normal.  Cardiovascular:     Rate and Rhythm: Normal rate and regular rhythm.     Heart sounds: Normal heart sounds, S1 normal and S2 normal.  Pulmonary:     Breath sounds: No decreased breath sounds, wheezing, rhonchi or rales.  Abdominal:     Palpations: Abdomen is soft.     Tenderness: There is no abdominal tenderness.  Musculoskeletal:     Right lower leg: No swelling.     Left lower leg: No swelling.  Skin:    General: Skin is warm.     Findings: No rash.  Neurological:     Mental Status: He is alert.     Comments: Answers all questions appropriately.  Power 4/5 left lower extremity.  Power 4+ out of 5 upper extremities and right lower extremity.      Condition at discharge: fair  The results of significant diagnostics from this hospitalization (including imaging, microbiology, ancillary and laboratory) are listed below for reference.   Imaging Studies: EEG adult  Result Date: 10-26-22 Jefferson Fuel, MD     10/03/2022  3:52 PM Routine EEG Report DEEN KEHLER is a 72 y.o. male with a history of seizure who is undergoing an EEG to evaluate for seizures. Report: This EEG was acquired with electrodes placed according to the International 10-20 electrode system (including Fp1, Fp2, F3, F4, C3, C4, P3, P4, O1, O2, T3, T4, T5, T6, A1, A2, Fz, Cz, Pz). The following electrodes were missing or displaced: none. The occipital dominant rhythm was 7-8 Hz. This activity is reactive to stimulation. Drowsiness was manifested by background fragmentation; deeper stages of sleep were identified by K complexes and sleep spindles. There was no focal slowing. There were no  interictal epileptiform discharges. There were no electrographic seizures identified. There was no abnormal response to photic stimulation or hyperventilation. Impression and clinical correlation: This EEG was obtained while awake and asleep and is abnormal due to mild diffuse slowing indicative of global cerebral dysfunction. Epileptiform abnormalities were not seen during this recording. Bing Neighbors, MD [facility] [phone] If 7pm- 7am, please page neurology on call as listed in AMION.   MR Lumbar Spine W Wo Contrast  Result Date: 26-Oct-2022 CLINICAL DATA:  Initial evaluation for lumbar radiculopathy. Infection suspected. History of lung cancer. EXAM: MRI LUMBAR SPINE WITHOUT AND WITH CONTRAST  TECHNIQUE: Multiplanar and multiecho pulse sequences of the lumbar spine were obtained without and with intravenous contrast. CONTRAST:  6mL GADAVIST GADOBUTROL 1 MMOL/ML IV SOLN COMPARISON:  Prior study from 04/08/2022. FINDINGS: Segmentation: Standard. Lowest well-formed disc space labeled the L5-S1 level. Alignment: 4 mm retrolisthesis of L5 on S1. Straightening with mild reversal of the normal lumbar lordosis. Vertebrae: Chronic compression deformity involving the superior endplate of L2, stable. Large endplate Schmorl's node deformity with associated height loss at the superior endplate of L5, also stable. Smaller endplate Schmorl's node deformity with mild height loss at the superior endplate of L3 noted as well, also stable. Vertebral body height otherwise maintained with no acute or interval fracture. Underlying bone marrow signal intensity within normal limits. A few small T1 hypointense lesions seen about the visualized posterior iliac wings bilaterally, largest of which seen on the left and measures 8 mm (series 12, image 33). These correspond with previously seen small sclerotic lesions, presumably reflecting patient's known osseous metastases. These were not hypermetabolic on prior PET-CT  from 07/03/2022. Mild degenerative reactive endplate changes noted about the L3-4 and L5-S1 interspaces. No evidence for osteomyelitis discitis or septic arthritis. Conus medullaris and cauda equina: Conus extends to the L1-2 level. Conus and cauda equina appear normal. Paraspinal and other soft tissues: Paraspinous soft tissues demonstrate no acute finding. Chronic fatty atrophy noted about the posterior paraspinous and psoas musculature. Disc levels: L1-2: Disc desiccation with mild disc bulge. Mild facet spurring. No spinal stenosis. Foramina remain patent. L2-3: Disc desiccation with mild disc bulge. No spinal stenosis. Foramina remain patent. L3-4: Degenerative intervertebral disc space narrowing with diffuse disc bulge. Associated reactive endplate spurring. Mild bilateral facet hypertrophy. Resultant mild left greater than right lateral recess stenosis. Central canal remains patent. Mild to moderate left with mild right L3 foraminal stenosis. L4-5: Mild disc bulge with reactive endplate spurring. Mild to moderate facet hypertrophy. Resultant mild to moderate bilateral subarticular stenosis. Central canal remains patent. Mild to moderate left with moderate right L4 foraminal stenosis. L5-S1: Retrolisthesis with degenerative intervertebral disc space narrowing. Central disc protrusion with slight inferior angulation and annular fissure indents the ventral thecal sac (series 12, image 33). Mild bilateral facet hypertrophy. Resultant mild bilateral subarticular stenosis. Central canal remains patent. Moderate bilateral L5 foraminal narrowing. IMPRESSION: 1. No MRI evidence for acute infection or other abnormality within the lumbar spine. 2. Few subcentimeter T1 hypointense lesions about the visualized posterior iliac wings. These correspond with small sclerotic foci seen on most recent CT from 04/08/2022, presumably reflecting patient's known osseous metastatic disease. These were not hypermetabolic on prior  PET-CT from 07/03/2022. 3. No other evidence for metastatic disease within the lumbar spine itself. 4. Chronic L2 compression fracture, stable. Large Schmorl's node deformity with associated height loss at L5, also stable. 5. Multifactorial degenerative changes at L3-4 through L5-S1 with resultant mild to moderate bilateral subarticular stenosis, with mild to moderate bilateral L3 through L5 foraminal narrowing as above. Electronically Signed   By: Rise Mu M.D.   On: 10/02/2022 05:22   MR BRAIN W WO CONTRAST  Result Date: 10/02/2022 CLINICAL DATA:  Initial evaluation for TIA. EXAM: MRI HEAD WITHOUT AND WITH CONTRAST TECHNIQUE: Multiplanar, multiecho pulse sequences of the brain and surrounding structures were obtained without and with intravenous contrast. CONTRAST:  6mL GADAVIST GADOBUTROL 1 MMOL/ML IV SOLN COMPARISON:  Prior Study from 10/01/2022 and MRI from 05/14/2022. FINDINGS: Brain: Cerebral volume within normal limits. Mild chronic microvascular ischemic disease noted involving the periventricular and deep  white matter both cerebral hemispheres as well as the pons. No evidence for acute or subacute ischemia. Gray-white matter differentiation maintained. No acute intracranial hemorrhage. Encephalomalacia with susceptibility artifact at the left cerebellum related to prior hemorrhage at this location. Multiple additional scattered chronic micro hemorrhages seen elsewhere throughout the brain, stable from prior, and consistent with prior hemorrhages. No mass lesion, midline shift or mass effect. No hydrocephalus or extra-axial fluid collection. Pituitary gland and suprasellar region within normal limits. No abnormal enhancement or evidence for intraparenchymal metastatic disease. Vascular: Loss of normal flow void within the left V4 segment, likely occluded, stable. Major intracranial vascular flow voids are otherwise maintained. Skull and upper cervical spine: Craniocervical junction within  normal limits. Diffusely heterogeneous signal intensity seen throughout the visualized bone marrow. Previously identified right parietal calvarial lesion again noted, stable. Small lipoma noted at the left frontal scalp. Sinuses/Orbits: Prior bilateral ocular lens replacement. Scattered mucosal thickening noted about the ethmoidal air cells and maxillary sinuses. No significant mastoid effusion. Other: None. IMPRESSION: 1. No acute intracranial abnormality. No evidence for intraparenchymal metastatic disease. 2. Chronic left cerebellar hemorrhage with multiple additional chronic micro hemorrhages elsewhere throughout the brain, stable. 3. Underlying mild chronic microvascular ischemic disease. Electronically Signed   By: Rise Mu M.D.   On: 10/02/2022 05:05   CT Angio Head Neck W WO CM  Result Date: 10/01/2022 CLINICAL DATA:  Neuro deficit, acute, stroke suspected. Generalized weakness. History of lung cancer and intracranial hemorrhage. EXAM: CT ANGIOGRAPHY HEAD AND NECK WITH AND WITHOUT CONTRAST TECHNIQUE: Multidetector CT imaging of the head and neck was performed using the standard protocol during bolus administration of intravenous contrast. Multiplanar CT image reconstructions and MIPs were obtained to evaluate the vascular anatomy. Carotid stenosis measurements (when applicable) are obtained utilizing NASCET criteria, using the distal internal carotid diameter as the denominator. RADIATION DOSE REDUCTION: This exam was performed according to the departmental dose-optimization program which includes automated exposure control, adjustment of the mA and/or kV according to patient size and/or use of iterative reconstruction technique. CONTRAST:  OMNIPAQUE IOHEXOL 350 MG/ML SOLN COMPARISON:  CTA head and neck 06/12/2021 FINDINGS: CTA NECK FINDINGS Aortic arch: Standard 3 vessel aortic arch with mixed calcified and soft plaque. No significant stenosis of the arch vessel origins. Right carotid  system: Patent with calcified and soft plaque about the carotid bifurcation resulting in 60% stenosis of the proximal ICA, progressed from prior. Left carotid system: Patent with heavily calcified plaque about the carotid bifurcation resulting in 75% or greater stenosis, mildly progressed from prior. Vertebral arteries: Patent with the right vertebral artery being dominant. Moderate to severe stenosis of the left vertebral artery origin, likely mildly progressed. Progressive, moderate left P3 stenosis. Skeleton: Interbody and left facet ankylosis at C4-5. Widespread advanced disc degeneration elsewhere in the cervical spine. Advanced facet arthrosis on the right at C2-3 and on the left at C3-4. Other neck: No enlarged or suspicious lymph nodes in the neck. Upper chest: Partially visualized left upper lobe chronic consolidation/fibrosis in the left upper lobe, more fully evaluated on the 07/03/2022 PET-CT. Review of the MIP images confirms the above findings CTA HEAD FINDINGS Anterior circulation: The internal carotid arteries are patent from skull base to carotid termini. Soft plaque in the proximal right cavernous ICA has progressed and now results in a severe stenosis, and there is also new mild stenosis of the proximal left cavernous ICA. ACAs and MCAs are patent with branch vessel irregularity but no evidence of a proximal branch  occlusion or significant proximal stenosis. No aneurysm is identified. Posterior circulation: The intracranial right vertebral artery is patent with moderate stenosis again noted. The left V4 segment is again noted to be heavily diseased with multiple severe stenoses and/or short segment occlusions. Fusiform aneurysmal dilatation to 4 mm diameter of the mid left V4 segment is unchanged. Patent PICA, AICA, and SCA origins are visualized bilaterally. The basilar artery is patent with atherosclerotic irregularity but no flow limiting stenosis. There are left larger than right posterior  communicating arteries with hypoplasia of the left P1 segment. Both PCAs are patent without evidence of a significant proximal stenosis. Venous sinuses: Not well evaluated due to arterial contrast timing. Anatomic variants: Predominantly fetal type origin of the left PCA. Review of the MIP images confirms the above findings IMPRESSION: 1. No emergent large vessel occlusion. 2. Progressive atherosclerosis in the head and neck including a new severe right cavernous ICA stenosis. 3. Progressive bilateral proximal cervical ICA stenoses, 75% or greater on the left and 60% on the right. 4. Moderate to severe stenosis of the left vertebral artery origin and severe multifocal stenoses and/or segmental occlusions of the left V4 segment. 5. 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 6.  Aortic Atherosclerosis (ICD10-I70.0). Electronically Signed   By: Sebastian Ache M.D.   On: 10/01/2022 16:33   CT Angio Chest PE W/Cm &/Or Wo Cm  Result Date: 10/01/2022 CLINICAL DATA:  Weakness. History of non-small-cell lung cancer left upper lobe. EXAM: CT ANGIOGRAPHY CHEST WITH CONTRAST TECHNIQUE: Multidetector CT imaging of the chest was performed using the standard protocol during bolus administration of intravenous contrast. Multiplanar CT image reconstructions and MIPs were obtained to evaluate the vascular anatomy. RADIATION DOSE REDUCTION: This exam was performed according to the departmental dose-optimization program which includes automated exposure control, adjustment of the mA and/or kV according to patient size and/or use of iterative reconstruction technique. CONTRAST:  OMNIPAQUE IOHEXOL 350 MG/ML SOLN COMPARISON:  PET-CT 07/03/2022.  Chest CT 04/28/2022 FINDINGS: Cardiovascular: Small pericardial effusion. Heart is nonenlarged. Coronary artery calcifications are seen. The thoracic aorta has a normal course and caliber with partially calcified plaque. No segmental or larger pulmonary embolism identified.  Right upper chest port in place with the tip along the upper right atrium. Mediastinum/Nodes: No specific abnormal lymph node enlargement identified in the axillary region, hilum or mediastinum. Mildly patulous thoracic esophagus. Small heterogeneous thyroid gland. Lungs/Pleura: There is some linear opacity seen in the right lung base likely scar or atelectasis. No right-sided consolidation, pneumothorax or effusion. Left upper lobe perihilar nodular opacity once again identified. Previously this measured 3.9 x 1.9 cm and today 3.6 by 1.8 cm on series 6, image 61. No left-sided new consolidation, pneumothorax or effusion. There is smaller area of nodular thickening along the interlobar fissure. On series 6, image 64 this measures 7 mm today and previously with smaller at 4 mm. Upper Abdomen: Along the upper abdomen the visualized portions of the adrenal glands are preserved. There is some contrast in the right renal collecting system from prior contrast administration. Musculoskeletal: Mild degenerative changes seen along the spine. There is sclerotic portion seen to the left seventh rib, unchanged from previous. Review of the MIP images confirms the above findings. IMPRESSION: No pulmonary embolism identified. Stable nodular opacity in the left upper lobe. Please correlate for history of treated lung cancer. There is an adjacent small nodule along the course of the interlobar fissure which is slightly larger today compared to the  study of February 2024. please correlate with findings of the recent PET-CT of 07/03/2022. Aortic Atherosclerosis (ICD10-I70.0). Electronically Signed   By: Karen Kays M.D.   On: 10/01/2022 16:31   CT Head Wo Contrast  Result Date: 10/01/2022 CLINICAL DATA:  Syncope/presyncope, cerebrovascular cause suspected EXAM: CT HEAD WITHOUT CONTRAST TECHNIQUE: Contiguous axial images were obtained from the base of the skull through the vertex without intravenous contrast. RADIATION DOSE  REDUCTION: This exam was performed according to the departmental dose-optimization program which includes automated exposure control, adjustment of the mA and/or kV according to patient size and/or use of iterative reconstruction technique. COMPARISON:  MRI Head 05/18/22 FINDINGS: Brain: Evolving hemorrhage in the left cerebellar hemisphere. No CT evidence of an acute cortical infarct. No new sites of hemorrhage visualized. No extra-axial fluid collection. Sequela of mild chronic microvascular ischemic change. No hydrocephalus. Vascular: No hyperdense vessel or unexpected calcification. Skull: Normal. Negative for fracture or focal lesion. Sinuses/Orbits: No middle ear or mastoid effusion. Paranasal sinuses are notable for frothy secretions in the right sphenoid sinus. Bilateral lens replacement. Orbits are otherwise unremarkable. Other: None. IMPRESSION: 1. Evolving hemorrhage in the left cerebellar hemisphere. No new sites of hemorrhage visualized. 2. Frothy secretions in the right sphenoid sinus, which can be seen in the setting of acute sinusitis. Electronically Signed   By: Lorenza Cambridge M.D.   On: 10/01/2022 15:28    Microbiology: Results for orders placed or performed during the hospital encounter of 10/01/22  SARS Coronavirus 2 by RT PCR (hospital order, performed in [facility] hospital lab) *cepheid single result test* Urine, Clean Catch     Status: None   Collection Time: 10/01/22  2:07 PM   Specimen: Urine, Clean Catch; Nasal Swab  Result Value Ref Range Status   SARS Coronavirus 2 by RT PCR NEGATIVE NEGATIVE Final    Comment: (NOTE) SARS-CoV-2 target nucleic acids are NOT DETECTED.  The SARS-CoV-2 RNA is generally detectable in upper and lower respiratory specimens during the acute phase of infection. The lowest concentration of SARS-CoV-2 viral copies this assay can detect is 250 copies / mL. A negative result does not preclude SARS-CoV-2 infection and should not be used as the sole  basis for treatment or other patient management decisions.  A negative result may occur with improper specimen collection / handling, submission of specimen other than nasopharyngeal swab, presence of viral mutation(s) within the areas targeted by this assay, and inadequate number of viral copies (<250 copies / mL). A negative result must be combined with clinical observations, patient history, and epidemiological information.  Fact Sheet for Patients:   RoadLapTop.co.za  Fact Sheet for Healthcare Providers: http://kim-miller.com/  This test is not yet approved or  cleared by the [place] FDA and has been authorized for detection and/or diagnosis of SARS-CoV-2 by FDA under an Emergency Use Authorization (EUA).  This EUA will remain in effect (meaning this test can be used) for the duration of the COVID-19 declaration under Section 564(b)(1) of the Act, 21 U.S.C. section 360bbb-3(b)(1), unless the authorization is terminated or revoked sooner.  Performed at [facility], [address] [place], [place] [address]     Labs: CBC: Recent Labs  Lab 10/01/22 1407 10/03/22 0428 10/04/22 0624 10/06/22 0431  WBC 4.9 4.1 5.0 6.4  HGB 10.2* 8.7* 8.3* 9.1*  HCT 32.2* 26.2* 24.9* 28.6*  MCV 90.4 86.5 86.5 89.1  PLT 262 259 250 278   Basic Metabolic Panel: Recent Labs  Lab 10/01/22 1407 10/03/22 0428  10/04/22 0624 10/06/22 0431  NA 130* 136 135 135  K 4.3 3.5 3.6 4.0  CL 103 111 110 106  CO2 20* 21* 20* 21*  GLUCOSE 88 113* 103* 111*  BUN 24* 17 16 22   CREATININE 1.23 1.14 1.22 1.14  CALCIUM 8.7* 8.3* 8.3* 8.9   Liver Function Tests: Recent Labs  Lab 10/01/22 1407  AST 29  ALT 17  ALKPHOS 71  BILITOT 1.2  PROT 7.1  ALBUMIN 3.2*   CBG: Recent Labs  Lab 10/05/22 1608 10/05/22 2150 10/06/22 0807 10/06/22 1225 10/06/22 1557  GLUCAP 129* 157* 96 151* 164*    Discharge time spent: greater than 30  minutes.  Signed: Alford Highland, MD [facility] 10/06/2022

## 2022-10-06 NOTE — TOC Progression Note (Signed)
Transition of Care [facility]) - Progression Note    Patient Details  Name: SETON ALDRIDGE MRN: [id number] Date of Birth: [birth year]  [facility]) CM/SW Contact  Allena Katz, LCSW Phone Number: 10/06/2022, 10:25 AM  Clinical Narrative:   PT to reassess today for CIR as per wife and patients preference. CSW following.          Expected Discharge Plan and Services                                               Social Determinants of Health (SDOH) Interventions SDOH Screenings   Food Insecurity: No Food Insecurity (10/01/2022)  Housing: Low Risk  (10/01/2022)  Transportation Needs: No Transportation Needs (10/01/2022)  Utilities: Not At Risk (10/01/2022)  Alcohol Screen: Low Risk  (03/27/2022)  Depression (PHQ2-9): High Risk (04/21/2022)  Financial Resource Strain: Low Risk  (04/17/2022)  Physical Activity: Inactive (03/27/2022)  Social Connections: Moderately Isolated (03/27/2022)  Stress: Stress Concern Present (03/27/2022)  Tobacco Use: Medium Risk (10/01/2022)    Readmission Risk Interventions    11/20/2021   10:17 AM  Readmission Risk Prevention Plan  Transportation Screening Complete  PCP or Specialist Appt within 3-5 Days Complete  HRI or Home Care Consult --  Social Work Consult for Recovery Care Planning/Counseling --  Palliative Care Screening Not Applicable  Medication Review Oceanographer) Complete

## 2022-10-08 ENCOUNTER — Telehealth: Payer: Self-pay

## 2022-10-08 NOTE — Transitions of Care (Post Inpatient/ED Visit) (Signed)
   10/08/2022  Name: Micheal Donovan MRN: [id number] DOB: Jun 26, 1950  Today's TOC FU Call Status: Today's TOC FU Call Status:: Unsuccessful Call (2nd Attempt) Unsuccessful Call (2nd Attempt) Date: 10/08/22  Attempted to reach the patient regarding the most recent Inpatient/ED visit.  Follow Up Plan: Additional outreach attempts will be made to reach the patient to complete the Transitions of Care (Post Inpatient/ED visit) call.   Jodelle Gross, RN, BSN, CCM Care Management Coordinator [place] Phone: [phone]/Fax: [phone]

## 2022-10-09 ENCOUNTER — Encounter: Payer: Self-pay | Admitting: Family Medicine

## 2022-10-09 ENCOUNTER — Ambulatory Visit (INDEPENDENT_AMBULATORY_CARE_PROVIDER_SITE_OTHER): Payer: PPO | Admitting: Family Medicine

## 2022-10-09 ENCOUNTER — Inpatient Hospital Stay: Payer: PPO | Attending: Radiation Oncology

## 2022-10-09 ENCOUNTER — Telehealth: Payer: Self-pay

## 2022-10-09 VITALS — BP 118/68 | HR 78 | Temp 98.1°F | Ht 67.0 in | Wt 123.6 lb

## 2022-10-09 DIAGNOSIS — M6281 Muscle weakness (generalized): Secondary | ICD-10-CM | POA: Diagnosis not present

## 2022-10-09 DIAGNOSIS — C3412 Malignant neoplasm of upper lobe, left bronchus or lung: Secondary | ICD-10-CM | POA: Insufficient documentation

## 2022-10-09 DIAGNOSIS — I2699 Other pulmonary embolism without acute cor pulmonale: Secondary | ICD-10-CM | POA: Insufficient documentation

## 2022-10-09 DIAGNOSIS — C7931 Secondary malignant neoplasm of brain: Secondary | ICD-10-CM | POA: Insufficient documentation

## 2022-10-09 DIAGNOSIS — Z87891 Personal history of nicotine dependence: Secondary | ICD-10-CM | POA: Insufficient documentation

## 2022-10-09 DIAGNOSIS — G629 Polyneuropathy, unspecified: Secondary | ICD-10-CM | POA: Insufficient documentation

## 2022-10-09 DIAGNOSIS — C7951 Secondary malignant neoplasm of bone: Secondary | ICD-10-CM | POA: Insufficient documentation

## 2022-10-09 LAB — CBC WITH DIFFERENTIAL/PLATELET
Absolute Monocytes: 397 cells/uL (ref 200–950)
Basophils Absolute: 50 cells/uL (ref 0–200)
Basophils Relative: 0.8 %
Eosinophils Absolute: 335 cells/uL (ref 15–500)
Eosinophils Relative: 5.4 %
HCT: 33.8 % — ABNORMAL LOW (ref 38.5–50.0)
Hemoglobin: 10.9 g/dL — ABNORMAL LOW (ref 13.2–17.1)
Lymphs Abs: 713 cells/uL — ABNORMAL LOW (ref 850–3900)
MCH: 28.8 pg (ref 27.0–33.0)
MCHC: 32.2 g/dL (ref 32.0–36.0)
MCV: 89.2 fL (ref 80.0–100.0)
MPV: 9.3 fL (ref 7.5–12.5)
Monocytes Relative: 6.4 %
Neutro Abs: 4706 cells/uL (ref 1500–7800)
Neutrophils Relative %: 75.9 %
Platelets: 364 10*3/uL (ref 140–400)
RBC: 3.79 10*6/uL — ABNORMAL LOW (ref 4.20–5.80)
RDW: 14.1 % (ref 11.0–15.0)
Total Lymphocyte: 11.5 %
WBC: 6.2 10*3/uL (ref 3.8–10.8)

## 2022-10-09 NOTE — Progress Notes (Signed)
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  Patient receiving [place].  Noted recent hospitalization 7/31-8/5 with orthostatic HTN, weakness, seizure, aphasia.    Spoke with patient via phone. Reports that appetite is good and was good during hospitalization.  Has had a cinnamon roll so far today.  Last night ate fried chicken, apples and something else.  Reports that he has not tried the protein powder yet that was given last visit.  Says that foods are tasting normal and going down without difficulty.      Medications: megace  Labs: reviewed  Anthropometrics:   Weight 132 lb (hospital wt) 133 lb on 7/18 150 lb on 09/27/21   NUTRITION DIAGNOSIS: Inadequate oral intake improving    INTERVENTION:  Continue high calorie, high protein diet to prevent weight loss Patient does not like oral nutrition supplements     MONITORING, EVALUATION, GOAL: weight trends, intake   NEXT VISIT: Thursday, Sept 12 phone call   B. Freida Busman, RD, LDN Registered Dietitian [phone]

## 2022-10-09 NOTE — Addendum Note (Signed)
Addended by: Lynnea Ferrier T on: 10/09/2022 03:46 PM   Modules accepted: Orders

## 2022-10-09 NOTE — Transitions of Care (Post Inpatient/ED Visit) (Signed)
   10/09/2022  Name: Micheal Donovan MRN: [id number] DOB: Sep 29, 1950  Today's TOC FU Call Status: Today's TOC FU Call Status:: Unsuccessful Call (3rd Attempt) Unsuccessful Call (3rd Attempt) Date: 10/09/22  Attempted to reach the patient regarding the most recent Inpatient/ED visit.  Follow Up Plan: No further outreach attempts will be made at this time. We have been unable to contact the patient.  Jodelle Gross, RN, BSN, CCM Care Management Coordinator [place] Phone: [phone]/Fax: [phone]

## 2022-10-09 NOTE — Progress Notes (Signed)
Subjective:    Patient ID: Micheal Donovan, male    DOB: [birth year], 72 y.o.   MRN: [id number] Admit date:     10/01/2022  Discharge date: 10/06/22  Discharge Physician: Micheal Donovan    PCP: Micheal Brooks, MD    Recommendations at discharge:    Follow-up PCP 5 days Follow-up oncology Follow-up outpatient neurology   Discharge Diagnoses: Principal Problem:   Orthostatic hypotension Active Problems:   Weakness   Seizure (HCC)   Primary non-small cell carcinoma of upper lobe of left lung (HCC)   Sinusitis   HYPERCHOLESTEROLEMIA   Carotid stenosis, bilateral   Type 2 diabetes mellitus with hyperglycemia (HCC)   Type 2 diabetes mellitus with hyperlipidemia (HCC)   Pulmonary embolus (HCC)   Essential hypertension   Anemia of chronic disease   (HFpEF) heart failure with preserved ejection fraction (HCC)   Myocardial ischemia   Malnutrition of moderate degree       Hospital Course: 72 y.o. male with medical history significant of multiple medical issues including stage IV lung cancer with mets to the bone, history of cerebral microhemorrhage, history of PE, type 2 diabetes, hypertension, lumbar radiculopathy, BPH, hyperlipidemia presenting with left lower extremity weakness and intermittent aphasia.  History from patient as well as his family.  Per report, patient with acute on chronic left lower extremity weakness.  Weakness has been more predominant over the past 2 weeks.  Patient had significant episode of difficulty with ambulation over the past 24 hours.  Also with intermittent episodes of patient staring off as well as aphasia.  Noted to be followed by Dr. Cathie Donovan outpatient with oncology.  Recently completed course of chemotherapy-Micheal Donovan.  No chest pain or shortness of breath.  No nausea or vomiting.  Weakness appears to be chronic issue.  No reported headache, vision changes. Presented to the ER afebrile, hemodynamically stable.  White count 4.9, hemoglobin 10.2, creatinine  123, sodium 130.  Troponin 1 37-1 17.  CT imaging with evolving hemorrhage of left cerebral hemisphere.  Frothy secretions in the right sphenoid sinus concerning for sinusitis.  CT angio chest negative for PE. Noted Progressive bilateral proximal cervical ICA stenoses, 75% or greater on the left and 60% on the right, moderate to severe stenosis of the left vertebral artery origin and severe multifocal stenoses and/or segmental occlusions of the left V4 segment. EKG NSR w/ RBBB.    8/1.  Patient feels total body weakness worse with the left leg.  Neurology starting Micheal Donovan and ordering an EEG.  Physical therapy recommends rehab. 8/2.  Patient orthostatic with physical therapy evaluation.  Patient still feels weak worse with the left leg. 8/3.  Patient did better with physical therapy today.  Still complains of weakness worse with his left leg.  Has chronic pain in his legs. 8/4.  Patient feeling better and interested in going home. 8/5.  Walked better with physical therapy.  Did well with physical therapy.  Patient and wife okay going home this evening.  Will set up home health.   Assessment and Plan: * Orthostatic hypotension Continue midodrine 10 mg 3 times daily.  TED hose.  Patient received a steroid injection last week so I cannot order a a.m. cortisol at this point.  Continue working with physical therapy.  Hold Flomax and irbesartan.   Seizure [facility]) Neurology starting Micheal Donovan.  EEG negative but neurology still recommends continuing Micheal Donovan as outpatient.   Weakness Noted worsening baseline weakness of left lower extremity in addition to mild  confusion and aphasia Baseline history of hemorrhagic CVA, lung cancer with metastatic disease Physical therapy recommending rehab.  Patient did better today with physical therapy and Occupational Therapy today.  Patient and wife interested in going home with home health.   Primary non-small cell carcinoma of upper lobe of left lung (HCC) Followed by Dr.  Cathie Donovan outpatient.  Receiving Micheal Donovan as outpatient. 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     Sinusitis Placed on doxycycline by admitting physician.  Will discontinue upon discharge.   Malnutrition of moderate degree Continue supplements.   Myocardial ischemia Likely demand ischemia with stroke workup.  Recent echo showed a normal EF.  No complaints of chest pain.  NSTEMI ruled out.   (HFpEF) heart failure with preserved ejection fraction [facility]) 2D echo April 2024 with EF of 55 to 60% and grade 1 diastolic dysfunction Chronic in nature.  No signs of heart failure with hydration.   Anemia of chronic disease Hemoglobin 9.1.    Pulmonary embolus (HCC) Prior history of pulmonary embolism.  Not on anticoagulation secondary to brain bleed.   Type 2 diabetes mellitus with hyperlipidemia (HCC) Hemoglobin A1c 6.6.  Sliding scale insulin.  Holding Lipitor.   Type 2 diabetes mellitus with hyperglycemia (HCC) Hemoglobin A1c 6.6.  On sliding scale insulin.   Carotid stenosis, bilateral Noted progressive bilateral proximal cervical ICA stenoses, 75% or greater on the left and 60% on the right. Moderate to severe stenosis of the left vertebral artery origin and severe multifocal stenoses and/or segmental occlusions of the left V4 segment noted on imaging.        HYPERCHOLESTEROLEMIA Hold Lipitor just in case contributing to weakness.    10/09/22 Patient was recently admitted.  I have copied the discharge summary above from my records.  Patient recently received Micheal Donovan as treatment for his lung cancer.  Approximately 24 hours after receiving infusion, he awoke and was able to move arms and legs.  Wife states that she had to pull him out of the bed because he lost control of his extremities due to profound muscle weakness.  He was also confused and staring into space.  He was  answering questions but was dazed.  Patient went to the hospital.  MRI showed no new stroke.  EEG showed no seizure focus.  Neurology recommended Micheal Donovan for seizure as the cause.  Patient was discharged home.  Today's muscle strength is 3/5 equal and symmetric in both arms.  He is extremely weak with flexion extending his elbows and wrist.  Strength 4/5.  Leg strength is 2-3/5 in the right leg and 1/5 in the left leg patient can push himself to a standing position however the slightest push causes him to lose his balance and almost fall.  He denies any numbness in his arms or in his right leg.  He has chronic nerve damage in his left leg stemming from his back. Past Medical History:  Diagnosis Date   Allergy    BPH (benign prostatic hypertrophy)    Cancer (HCC)    Melanoma on Neck    2008   Carotid artery occlusion    CHF (congestive heart failure) (HCC)    Diabetes mellitus    type 2   ED (erectile dysfunction)    GERD (gastroesophageal reflux disease)    Hemorrhagic stroke (HCC) 06/2021   Hyperlipidemia    Hypertension    Lung  cancer (HCC)    Neoplasm related pain    Retinopathy due to secondary DM [facility])    Past Surgical History:  Procedure Laterality Date   BRONCHIAL NEEDLE ASPIRATION BIOPSY  06/17/2021   Procedure: BRONCHIAL NEEDLE ASPIRATION BIOPSIES;  Surgeon: Lorin Glass, MD;  Location: [facility] ENDOSCOPY;  Service: Pulmonary;;   IR IMAGING GUIDED PORT INSERTION  08/05/2021   MELANOMA EXCISION  2008   Left side of neck   RADIOLOGY WITH ANESTHESIA N/A 04/05/2020   Procedure: MRI SPINE WITOUT CONTRAST;  Surgeon: Radiologist, Medication, MD;  Location: [facility] OR;  Service: Radiology;  Laterality: N/A;   RADIOLOGY WITH ANESTHESIA N/A 08/22/2021   Procedure: MRI BRAIN WITH AND WITHOUT CONTRAST  WITH ANESTHESIA;  Surgeon: Radiologist, Medication, MD;  Location: [facility] OR;  Service: Radiology;  Laterality: N/A;   RADIOLOGY WITH ANESTHESIA N/A 12/17/2021   Procedure: MRI BRAIN WITH AND WITHOUT  CONTRAST WITH ANESTHESIA; MRI LUMBER WITH AND WITHOUT CONTRAST;  Surgeon: Radiologist, Medication, MD;  Location: [facility] OR;  Service: Radiology;  Laterality: N/A;   RADIOLOGY WITH ANESTHESIA N/A 04/08/2022   Procedure: MRI LUMBER SPINE WITH AND WITHOUT CONTRAST;  Surgeon: Radiologist, Medication, MD;  Location: [facility] OR;  Service: Radiology;  Laterality: N/A;   TONSILLECTOMY     VIDEO BRONCHOSCOPY WITH ENDOBRONCHIAL ULTRASOUND N/A 06/17/2021   Procedure: VIDEO BRONCHOSCOPY WITH ENDOBRONCHIAL ULTRASOUND;  Surgeon: Lorin Glass, MD;  Location: [facility] ENDOSCOPY;  Service: Pulmonary;  Laterality: N/A;   Current Outpatient Medications on File Prior to Visit  Medication Sig Dispense Refill   aspirin EC 81 MG tablet Take 1 tablet (81 mg total) by mouth daily. Swallow whole. 30 tablet 12   docusate sodium (COLACE) 100 MG capsule Take 100 mg by mouth 2 (two) times daily as needed for mild constipation.     fentaNYL (DURAGESIC) 75 MCG/HR Place 1 patch onto the skin every 3 (three) days. 10 patch 0   finasteride (PROSCAR) 5 MG tablet Take 1 tablet (5 mg total) by mouth daily. 30 tablet 0   gabapentin (NEURONTIN) 300 MG capsule TAKE 1 CAPSULE(300 MG) BY MOUTH THREE TIMES DAILY 90 capsule 1   levETIRAcetam (Micheal Donovan) 500 MG tablet Take 1 tablet (500 mg total) by mouth 2 (two) times daily. 60 tablet 0   megestrol (MEGACE) 40 MG tablet Take 1 tablet (40 mg total) by mouth daily. 30 tablet 3   midodrine (PROAMATINE) 10 MG tablet Take 1 tablet (10 mg total) by mouth 3 (three) times daily with meals. 90 tablet 0   Multiple Vitamin (MULTIVITAMIN WITH MINERALS) TABS tablet Take 1 tablet by mouth daily.     NOVOLOG FLEXPEN 100 UNIT/ML FlexPen Inject 0-10 Units into the skin daily as needed for high blood sugar (BG > 200).     Omeprazole 20 MG TBEC Take 20 mg by mouth daily.     oxyCODONE (ROXICODONE) 15 MG immediate release tablet Take 1 tablet (15 mg total) by mouth every 6 (six) hours as needed for up to 5 days for severe  pain. 20 tablet 0   traZODone (DESYREL) 50 MG tablet Take 1 tablet (50 mg total) by mouth at bedtime as needed for sleep. 30 tablet 3   [DISCONTINUED] prochlorperazine (COMPAZINE) 10 MG tablet Take 1 tablet (10 mg total) by mouth every 6 (six) hours as needed (Nausea or vomiting). 30 tablet 1   Current Facility-Administered Medications on File Prior to Visit  Medication Dose Route Frequency Provider Last Rate Last Admin   heparin lock flush 100  UNIT/ML injection              Allergies  Allergen Reactions   Niaspan [Niacin]     FLUSHING   Social History   Socioeconomic History   Marital status: Married    Spouse name: Not on file   Number of children: Not on file   Years of education: Not on file   Highest education level: Not on file  Occupational History   Not on file  Tobacco Use   Smoking status: Former    Current packs/day: 0.00    Types: Cigarettes    Quit date: 07/21/1990    Years since quitting: 32.2   Smokeless tobacco: Never  Vaping Use   Vaping status: Never Used  Substance and Sexual Activity   Alcohol use: No   Drug use: No   Sexual activity: Not Currently  Other Topics Concern   Not on file  Social History Narrative   Not on file   Social Determinants of Health   Financial Resource Strain: Low Risk  (04/17/2022)   Overall Financial Resource Strain (CARDIA)    Difficulty of Paying Living Expenses: Not hard at all  Food Insecurity: No Food Insecurity (10/01/2022)   Hunger Vital Sign    Worried About Running Out of Food in the Last Year: Never true    Ran Out of Food in the Last Year: Never true  Transportation Needs: No Transportation Needs (10/01/2022)   PRAPARE - Administrator, Civil Service (Medical): No    Lack of Transportation (Non-Medical): No  Physical Activity: Inactive (03/27/2022)   Exercise Vital Sign    Days of Exercise per Week: 0 days    Minutes of Exercise per Session: 0 min  Stress: Stress Concern Present (03/27/2022)    [employer] of Occupational Health - Occupational Stress Questionnaire    Feeling of Stress : To some extent  Social Connections: Moderately Isolated (03/27/2022)   Social Connection and Isolation Panel [NHANES]    Frequency of Communication with Friends and Family: More than three times a week    Frequency of Social Gatherings with Friends and Family: Three times a week    Attends Religious Services: Never    Active Member of Clubs or Organizations: No    Attends Banker Meetings: Never    Marital Status: Married  Catering manager Violence: Not At Risk (10/01/2022)   Humiliation, Afraid, Rape, and Kick questionnaire    Fear of Current or Ex-Partner: No    Emotionally Abused: No    Physically Abused: No    Sexually Abused: No    Past Medical History:  Diagnosis Date   Allergy    BPH (benign prostatic hypertrophy)    Cancer (HCC)    Melanoma on Neck    2008   Carotid artery occlusion    CHF (congestive heart failure) (HCC)    Diabetes mellitus    type 2   ED (erectile dysfunction)    GERD (gastroesophageal reflux disease)    Hemorrhagic stroke (HCC) 06/2021   Hyperlipidemia    Hypertension    Lung cancer (HCC)    Neoplasm related pain    Retinopathy due to secondary DM [facility])    Past Surgical History:  Procedure Laterality Date   BRONCHIAL NEEDLE ASPIRATION BIOPSY  06/17/2021   Procedure: BRONCHIAL NEEDLE ASPIRATION BIOPSIES;  Surgeon: Lorin Glass, MD;  Location: [facility] ENDOSCOPY;  Service: Pulmonary;;   IR IMAGING GUIDED PORT INSERTION  08/05/2021   MELANOMA  EXCISION  2008   Left side of neck   RADIOLOGY WITH ANESTHESIA N/A 04/05/2020   Procedure: MRI SPINE WITOUT CONTRAST;  Surgeon: Radiologist, Medication, MD;  Location: [facility] OR;  Service: Radiology;  Laterality: N/A;   RADIOLOGY WITH ANESTHESIA N/A 08/22/2021   Procedure: MRI BRAIN WITH AND WITHOUT CONTRAST  WITH ANESTHESIA;  Surgeon: Radiologist, Medication, MD;  Location: [facility] OR;  Service: Radiology;   Laterality: N/A;   RADIOLOGY WITH ANESTHESIA N/A 12/17/2021   Procedure: MRI BRAIN WITH AND WITHOUT CONTRAST WITH ANESTHESIA; MRI LUMBER WITH AND WITHOUT CONTRAST;  Surgeon: Radiologist, Medication, MD;  Location: [facility] OR;  Service: Radiology;  Laterality: N/A;   RADIOLOGY WITH ANESTHESIA N/A 04/08/2022   Procedure: MRI LUMBER SPINE WITH AND WITHOUT CONTRAST;  Surgeon: Radiologist, Medication, MD;  Location: [facility] OR;  Service: Radiology;  Laterality: N/A;   TONSILLECTOMY     VIDEO BRONCHOSCOPY WITH ENDOBRONCHIAL ULTRASOUND N/A 06/17/2021   Procedure: VIDEO BRONCHOSCOPY WITH ENDOBRONCHIAL ULTRASOUND;  Surgeon: Lorin Glass, MD;  Location[facility] ENDOSCOPY;  Service: Pulmonary;  Laterality: N/A;   Current Outpatient Medications on File Prior to Visit  Medication Sig Dispense Refill   aspirin EC 81 MG tablet Take 1 tablet (81 mg total) by mouth daily. Swallow whole. 30 tablet 12   docusate sodium (COLACE) 100 MG capsule Take 100 mg by mouth 2 (two) times daily as needed for mild constipation.     fentaNYL (DURAGESIC) 75 MCG/HR Place 1 patch onto the skin every 3 (three) days. 10 patch 0   finasteride (PROSCAR) 5 MG tablet Take 1 tablet (5 mg total) by mouth daily. 30 tablet 0   gabapentin (NEURONTIN) 300 MG capsule TAKE 1 CAPSULE(300 MG) BY MOUTH THREE TIMES DAILY 90 capsule 1   levETIRAcetam (Micheal Donovan) 500 MG tablet Take 1 tablet (500 mg total) by mouth 2 (two) times daily. 60 tablet 0   megestrol (MEGACE) 40 MG tablet Take 1 tablet (40 mg total) by mouth daily. 30 tablet 3   midodrine (PROAMATINE) 10 MG tablet Take 1 tablet (10 mg total) by mouth 3 (three) times daily with meals. 90 tablet 0   Multiple Vitamin (MULTIVITAMIN WITH MINERALS) TABS tablet Take 1 tablet by mouth daily.     NOVOLOG FLEXPEN 100 UNIT/ML FlexPen Inject 0-10 Units into the skin daily as needed for high blood sugar (BG > 200).     Omeprazole 20 MG TBEC Take 20 mg by mouth daily.     oxyCODONE (ROXICODONE) 15 MG immediate release  tablet Take 1 tablet (15 mg total) by mouth every 6 (six) hours as needed for up to 5 days for severe pain. 20 tablet 0   traZODone (DESYREL) 50 MG tablet Take 1 tablet (50 mg total) by mouth at bedtime as needed for sleep. 30 tablet 3   [DISCONTINUED] prochlorperazine (COMPAZINE) 10 MG tablet Take 1 tablet (10 mg total) by mouth every 6 (six) hours as needed (Nausea or vomiting). 30 tablet 1   Current Facility-Administered Medications on File Prior to Visit  Medication Dose Route Frequency Provider Last Rate Last Admin   heparin lock flush 100 UNIT/ML injection            Allergies  Allergen Reactions   Niaspan [Niacin]     FLUSHING   Social History   Socioeconomic History   Marital status: Married    Spouse name: Not on file   Number of children: Not on file   Years of education: Not on file   Highest education  level: Not on file  Occupational History   Not on file  Tobacco Use   Smoking status: Former    Current packs/day: 0.00    Types: Cigarettes    Quit date: 07/21/1990    Years since quitting: 32.2   Smokeless tobacco: Never  Vaping Use   Vaping status: Never Used  Substance and Sexual Activity   Alcohol use: No   Drug use: No   Sexual activity: Not Currently  Other Topics Concern   Not on file  Social History Narrative   Not on file   Social Determinants of Health   Financial Resource Strain: Low Risk  (04/17/2022)   Overall Financial Resource Strain (CARDIA)    Difficulty of Paying Living Expenses: Not hard at all  Food Insecurity: No Food Insecurity (10/01/2022)   Hunger Vital Sign    Worried About Running Out of Food in the Last Year: Never true    Ran Out of Food in the Last Year: Never true  Transportation Needs: No Transportation Needs (10/01/2022)   PRAPARE - Administrator, Civil Service (Medical): No    Lack of Transportation (Non-Medical): No  Physical Activity: Inactive (03/27/2022)   Exercise Vital Sign    Days of Exercise per Week: 0  days    Minutes of Exercise per Session: 0 min  Stress: Stress Concern Present (03/27/2022)   [employer] of Occupational Health - Occupational Stress Questionnaire    Feeling of Stress : To some extent  Social Connections: Moderately Isolated (03/27/2022)   Social Connection and Isolation Panel [NHANES]    Frequency of Communication with Friends and Family: More than three times a week    Frequency of Social Gatherings with Friends and Family: Three times a week    Attends Religious Services: Never    Active Member of Clubs or Organizations: No    Attends Banker Meetings: Never    Marital Status: Married  Catering manager Violence: Not At Risk (10/01/2022)   Humiliation, Afraid, Rape, and Kick questionnaire    Fear of Current or Ex-Partner: No    Emotionally Abused: No    Physically Abused: No    Sexually Abused: No      Review of Systems  Musculoskeletal:  Positive for back pain.  All other systems reviewed and are negative.      Objective:   Physical Exam Vitals reviewed.  Constitutional:      General: He is not in acute distress.    Appearance: He is underweight. He is ill-appearing.  HENT:     Mouth/Throat:     Mouth: Mucous membranes are moist.  Cardiovascular:     Rate and Rhythm: Normal rate and regular rhythm.     Heart sounds: Normal heart sounds. No murmur heard.    No friction rub. No gallop.  Pulmonary:     Effort: Pulmonary effort is normal. No respiratory distress.     Breath sounds: Normal breath sounds. No stridor. No wheezing, rhonchi or rales.  Musculoskeletal:       Back:     Right lower leg: No edema.     Left lower leg: No edema.  Neurological:     General: No focal deficit present.     Mental Status: He is alert and oriented to person, place, and time.     Cranial Nerves: No cranial nerve deficit.     Sensory: Sensation is intact. No sensory deficit.     Motor: Weakness present. No tremor,  seizure activity or pronator  drift.     Coordination: Coordination normal.     Gait: Gait is intact.     Deep Tendon Reflexes: Reflexes normal.           Assessment & Plan:  Muscle weakness - Plan: CBC with Differential/Platelet, COMPLETE METABOLIC PANEL WITH GFR, Magnesium, Phosphorus, CK, Vitamin B12, TSH, Sedimentation rate I am concerned that this may be a reaction to the [facility].  I will consult his oncologist today to get their opinion regarding this as this may change future plans regarding treatment for his cancer.  He is already holding his statin.  I will check a CK level to look for any evidence of myositis however he denies any muscle pain.  Check electrolytes including magnesium and phosphorus for any electrolyte disturbances that would cause muscle weakness.  Check TSH and vitamin B12 for possible contributors with neuropathy.  If oncology does not feel this could be from [facility], would recommend neurology collagen for nerve conduction study/EMG to evaluate for muscle weakness and polyneuropathy

## 2022-10-10 ENCOUNTER — Encounter: Payer: Self-pay | Admitting: Oncology

## 2022-10-10 ENCOUNTER — Other Ambulatory Visit: Payer: Self-pay | Admitting: Family Medicine

## 2022-10-10 ENCOUNTER — Telehealth: Payer: Self-pay | Admitting: *Deleted

## 2022-10-10 DIAGNOSIS — C3412 Malignant neoplasm of upper lobe, left bronchus or lung: Secondary | ICD-10-CM

## 2022-10-10 DIAGNOSIS — G629 Polyneuropathy, unspecified: Secondary | ICD-10-CM

## 2022-10-10 DIAGNOSIS — M6281 Muscle weakness (generalized): Secondary | ICD-10-CM

## 2022-10-10 NOTE — Telephone Encounter (Signed)
PT would like verbal orders to continue therapy. 1 visit per week for 10 weeks. Chris at [employer]  [phone].

## 2022-10-10 NOTE — Telephone Encounter (Signed)
I spoke with Thayer Ohm at [facility] PT dept. Gave him the v/o from [place], NP ok to continue 1 visit per week x 10 wks. Thayer Ohm thanked me for returning his phone call.

## 2022-10-13 ENCOUNTER — Telehealth: Payer: Self-pay

## 2022-10-13 ENCOUNTER — Ambulatory Visit: Payer: PPO

## 2022-10-13 NOTE — Telephone Encounter (Signed)
Spoke with OT. Orders given.

## 2022-10-13 NOTE — Telephone Encounter (Signed)
Darral Dash, occupational therapist did eval on patient and would like a call back for a verbal order Call back # is [phone]

## 2022-10-14 ENCOUNTER — Other Ambulatory Visit: Payer: Self-pay | Admitting: Family Medicine

## 2022-10-14 DIAGNOSIS — M6281 Muscle weakness (generalized): Secondary | ICD-10-CM

## 2022-10-14 DIAGNOSIS — G629 Polyneuropathy, unspecified: Secondary | ICD-10-CM

## 2022-10-14 DIAGNOSIS — C3412 Malignant neoplasm of upper lobe, left bronchus or lung: Secondary | ICD-10-CM

## 2022-10-15 ENCOUNTER — Inpatient Hospital Stay (HOSPITAL_BASED_OUTPATIENT_CLINIC_OR_DEPARTMENT_OTHER): Payer: PPO | Admitting: Hospice and Palliative Medicine

## 2022-10-15 DIAGNOSIS — C3412 Malignant neoplasm of upper lobe, left bronchus or lung: Secondary | ICD-10-CM

## 2022-10-15 DIAGNOSIS — Z515 Encounter for palliative care: Secondary | ICD-10-CM

## 2022-10-15 NOTE — Progress Notes (Signed)
Virtual Visit via Telephone Note  I connected with Micheal Donovan on 10/15/22 at  1:00 PM EDT by telephone and verified that I am speaking with the correct person using two identifiers.  Location: Patient: Home Provider: Clinic   I discussed the limitations, risks, security and privacy concerns of performing an evaluation and management service by telephone and the availability of in person appointments. I also discussed with the patient that there may be a patient responsible charge related to this service. The patient expressed understanding and agreed to proceed.   History of Present Illness: Micheal Donovan is a 72 y.o. male with multiple medical problems including non-small cell lung cancer, history of subacute intraparenchymal hemorrhage, PE.  Patient is status post XRT.  He is on systemic chemo.  He is referred to palliative care to address goals and manage ongoing symptoms.    Observations/Objective: I spoke with patient and wife by phone.   Patient hospitalized 7/31 to 8/5 with orthostatic hypotension and possible seizure.  Patient was started on Keppra.  He remained frail and weak but was discharged home with home health.  I spoke yesterday with Occupational Therapy.  Orders given.  Patient says that he has not yet started PT at home but does feel he is getting better with occupational therapy.  He denies any changes or concerns  Assessment and Plan: Neoplasm related pain -continue fentanyl/Percocet.    Weakness -continue home health PT/OT  Follow Up Instructions: Follow-up telephone visit 1 month   I discussed the assessment and treatment plan with the patient. The patient was provided an opportunity to ask questions and all were answered. The patient agreed with the plan and demonstrated an understanding of the instructions.   The patient was advised to call back or seek an in-person evaluation if the symptoms worsen or if the condition fails to improve as anticipated.  I  provided 8 minutes of non-face-to-face time during this encounter.   Micheal Moan, NP

## 2022-10-17 ENCOUNTER — Encounter: Payer: PPO | Admitting: Pharmacist

## 2022-10-20 ENCOUNTER — Other Ambulatory Visit: Payer: PPO

## 2022-10-20 ENCOUNTER — Inpatient Hospital Stay (HOSPITAL_BASED_OUTPATIENT_CLINIC_OR_DEPARTMENT_OTHER): Payer: PPO | Admitting: Oncology

## 2022-10-20 ENCOUNTER — Encounter: Payer: Self-pay | Admitting: Oncology

## 2022-10-20 ENCOUNTER — Inpatient Hospital Stay: Payer: PPO

## 2022-10-20 ENCOUNTER — Ambulatory Visit: Payer: PPO

## 2022-10-20 ENCOUNTER — Ambulatory Visit: Payer: PPO | Admitting: Oncology

## 2022-10-20 VITALS — BP 132/76 | HR 87 | Temp 96.2°F | Resp 18 | Wt 120.9 lb

## 2022-10-20 DIAGNOSIS — C3412 Malignant neoplasm of upper lobe, left bronchus or lung: Secondary | ICD-10-CM | POA: Diagnosis not present

## 2022-10-20 DIAGNOSIS — I619 Nontraumatic intracerebral hemorrhage, unspecified: Secondary | ICD-10-CM

## 2022-10-20 DIAGNOSIS — I2699 Other pulmonary embolism without acute cor pulmonale: Secondary | ICD-10-CM | POA: Diagnosis not present

## 2022-10-20 DIAGNOSIS — G629 Polyneuropathy, unspecified: Secondary | ICD-10-CM

## 2022-10-20 DIAGNOSIS — C7951 Secondary malignant neoplasm of bone: Secondary | ICD-10-CM | POA: Diagnosis not present

## 2022-10-20 DIAGNOSIS — C7931 Secondary malignant neoplasm of brain: Secondary | ICD-10-CM | POA: Diagnosis not present

## 2022-10-20 DIAGNOSIS — M6281 Muscle weakness (generalized): Secondary | ICD-10-CM | POA: Diagnosis not present

## 2022-10-20 DIAGNOSIS — R29898 Other symptoms and signs involving the musculoskeletal system: Secondary | ICD-10-CM

## 2022-10-20 DIAGNOSIS — Z87891 Personal history of nicotine dependence: Secondary | ICD-10-CM | POA: Diagnosis not present

## 2022-10-20 LAB — CBC WITH DIFFERENTIAL/PLATELET
Abs Immature Granulocytes: 0.03 10*3/uL (ref 0.00–0.07)
Basophils Absolute: 0 10*3/uL (ref 0.0–0.1)
Basophils Relative: 1 %
Eosinophils Absolute: 0.2 10*3/uL (ref 0.0–0.5)
Eosinophils Relative: 2 %
HCT: 34.2 % — ABNORMAL LOW (ref 39.0–52.0)
Hemoglobin: 11 g/dL — ABNORMAL LOW (ref 13.0–17.0)
Immature Granulocytes: 1 %
Lymphocytes Relative: 7 %
Lymphs Abs: 0.5 10*3/uL — ABNORMAL LOW (ref 0.7–4.0)
MCH: 29.4 pg (ref 26.0–34.0)
MCHC: 32.2 g/dL (ref 30.0–36.0)
MCV: 91.4 fL (ref 80.0–100.0)
Monocytes Absolute: 0.4 10*3/uL (ref 0.1–1.0)
Monocytes Relative: 6 %
Neutro Abs: 5.3 10*3/uL (ref 1.7–7.7)
Neutrophils Relative %: 83 %
Platelets: 228 10*3/uL (ref 150–400)
RBC: 3.74 MIL/uL — ABNORMAL LOW (ref 4.22–5.81)
RDW: 15.9 % — ABNORMAL HIGH (ref 11.5–15.5)
WBC: 6.3 10*3/uL (ref 4.0–10.5)
nRBC: 0 % (ref 0.0–0.2)

## 2022-10-20 LAB — COMPREHENSIVE METABOLIC PANEL
ALT: 13 U/L (ref 0–44)
AST: 18 U/L (ref 15–41)
Albumin: 3.8 g/dL (ref 3.5–5.0)
Alkaline Phosphatase: 69 U/L (ref 38–126)
Anion gap: 7 (ref 5–15)
BUN: 20 mg/dL (ref 8–23)
CO2: 22 mmol/L (ref 22–32)
Calcium: 8.7 mg/dL — ABNORMAL LOW (ref 8.9–10.3)
Chloride: 104 mmol/L (ref 98–111)
Creatinine, Ser: 1.02 mg/dL (ref 0.61–1.24)
GFR, Estimated: >60 mL/min (ref >60)
Glucose, Bld: 140 mg/dL — ABNORMAL HIGH (ref 70–99)
Potassium: 3.9 mmol/L (ref 3.5–5.1)
Sodium: 133 mmol/L — ABNORMAL LOW (ref 135–145)
Total Bilirubin: 0.5 mg/dL (ref 0.3–1.2)
Total Protein: 7.1 g/dL (ref 6.5–8.1)

## 2022-10-20 MED ORDER — HEPARIN SOD (PORK) LOCK FLUSH 100 UNIT/ML IV SOLN
500.0000 [IU] | Freq: Once | INTRAVENOUS | Status: AC
  Administered 2022-10-20: 500 [IU] via INTRAVENOUS
  Filled 2022-10-20: qty 5

## 2022-10-20 NOTE — Progress Notes (Addendum)
Hematology/Oncology Progress note [phone] [phone]       CHIEF COMPLAINTS/REASON FOR VISIT:  Metastatic non-small cell lung cancer  ASSESSMENT & PLAN:   Cancer Staging  Primary non-small cell carcinoma of upper lobe of left lung (HCC) Staging form: Lung, AJCC 8th Edition - Clinical: Stage IV (cT2b, cN1, cM1) - Signed by [name], MD on 08/06/2021   Primary non-small cell carcinoma of upper lobe of left lung (HCC) Stage IV lung adenocarcinoma with brain and bone metastasis.  S/p lung radiation to left lung.  Labs are reviewed and discussed with patient. Hold off Keytruda.  It is unclear if Micheal Donovan is contributing to his acute muscle weakness after treatments. He also receives Zometa on the same day.  Unclear if this is Zometa side effects. Patient has been on Keytruda and Zometa in the past and his acute muscle weakness is a new symptom for him. Patient has multiple comorbidities and a poor performance status.  Not a candidate for chemotherapy Show decision was made to give him 4 to 6 weeks of chemo break. Continue follow-up with palliative care service   Intraparenchymal hemorrhage of brain (HCC) Hold Eliquis.  Follow-up with Dr. Barbaraann Cao Repeat MRI brain-stable findings  Pulmonary embolus [facility]) Previous MRI brain shows persistent microbleeds.   Off Eliquis 2.5mg  BID.   Metastasis to bone (HCC) Recommend Zometa every 4- 6 weeks.-  hold off today due to possible upcoming surgery Continue calcium supplementation.  MRI lumbar with and without contrast showed  Stable remote superior endplate fracture of L2 and large Schmorl to noted involving L5. 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 PET scan shows stable bone lesions.  L4 fracture uptake likely benign- s/p nerve block    Neuropathy Fingertip neuropathy intermittent Grade 1, feet neuropathy persistent, Grade 2.  On Gabapentin  300mg  TID     Weakness of left lower extremity Acute on chronic. Currently on Keppra for presumed seizure. Recommend patient to follow-up with Dr. Barbaraann Cao Follow-up with physical therapy   Follow up in late September 2024. All questions were answered. The patient knows to call the clinic with any problems, questions or concerns.  [name], MD, [name] [facility] Hematology Oncology 10/20/2022      HISTORY OF PRESENTING ILLNESS:   Micheal Donovan is a  72 y.o.  male presents for follow up of Non-small cell lung cancer.  Oncology History Overview Note  Diagnosis: Stage IIB T2b N1 M0 adenocarcinoma of the LUL, poorly differentiated     Primary non-small cell carcinoma of upper lobe of left lung (HCC)  06/13/2021 Initial Diagnosis   Primary non-small cell carcinoma of upper lobe of left lung [facility]) -06/20/2021 - 06/27/2021, patient presented to [facility] due to progressive headache/dizziness/gait changes. CT head showed acute to subacute intraparenchymal hemorrhage involving the left cerebellum.  Surrounding low-density vasogenic edema.  Patient was transferred to [facility]. This was further evaluated by CT angiogram of the neck which showed bulky calcified plaques/stenosis of carotid artery, Followed by MRI brain. 06/12/2021, MRI of the brain showed no significant interval change in size of the left cerebellar intraparenchymal hematoma with unchanged regional mass effect and partial effacement of fourth ventricle but no upstream hydrocephalus. There is no discernible enhancement to suggest underlying mass lesion, though acute blood products could mask enhancement.   06/12/2021 a chest x-ray showed a 4.4 cm left middle lobe. 06/13/2021, CT chest with contrast showed a 4.3 x 3.6  x 3.3 cm lobular spiculated mass in the posterior left upper lobe with T3 to the lateral pleura and major fissure.  Metastatic left hilar lymphadenopathy.  Peripheral micronodularity posterior right costophrenic  sulcus.  Aortic atherosclerosis. 06/14/2021, CT abdomen pelvis showed right lower lobe pulmonary artery embolus.  No evidence of right heart strain.  No acute intra-abdominal or pelvic pathology.  Aortic atherosclerosis.  06/13/2021, patient underwent bronchoscopy with EBUS by Dr. Katrinka Blazing.  Biopsy from the fine-needle aspiration of station 11 mL showed malignant cells, consistent with poorly differentiated non-small cell carcinoma, consistent with adenocarcinoma.  Malignant cells are TTF-1 positive and negative for p40.  Negative for neuroendocrine markers.  Blood test and tissue sample were tested for Gardant 360  -PD-L1 TPS 97%, no actionable mutation on the blood testing. 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   07/17/2021 Cancer Staging   Staging form: Lung, AJCC 8th Edition - Clinical: Stage IV (cT2b, cN1, cM1) - Signed by [name], MD on 08/06/2021   08/06/2021 Imaging   I saw patient's PET scan after his visit with me on 08/06/21. PET scan was ordered by his previous oncologist group and did not come to my in basket.. Patient received cycle 1 carboplatin Taxol today.  He has started on radiation.  5/26/3 PET scan showed 3.8 cm hypermetabolic left upper lobe mass with hypermetabolic left hilar and infrahilar adenopathy.  There is approximately 8 scattered metastatic lesions in the skeleton. Mixed density photopenic lesion in the left cerebellum. characterized as hemorrhage on recent prior imaging workups.    08/16/2021 -  Chemotherapy   Patient is on Treatment Plan : LUNG Carboplatin (5) + Pemetrexed + Pembrolizumab (200) D1 q21d Induction x 4 cycles / Maintenance Pemetrexed + Pembrolizumab (200) D1 q21d      08/22/2021 Imaging   MRI brain w wo contrast  Decrease in size of left cerebellar hematoma with resolution of edema. No evidence of underlying lesion. Smaller, more recent hemorrhage in the left cerebellar vermis with minimal edema. There is minimal enhancement without  definite evidence of underlying lesion.  New punctate focus of chronic blood products and enhancement in the left frontal lobe. Additional new foci of chronic blood products in the posterior right putamen, right parietal subcortical white matter, and posterior right cerebellum. Unclear at this time if these represent foci of bland hemorrhage or early metastases.   Increase in size of right parietal osseous metastasis with minor extraosseous extension.     Imaging   PET scan showed 1. Interval response to therapy as evidenced by a small residual left upper lobe nodule with decreased hypermetabolism, no residual hypermetabolic adenopathy and decreased hypermetabolism associatedwith osseous metastases. 2. 6 mm posterior left upper lobe nodule, likely stable. Recommend attention on follow-up. 3. Aortic atherosclerosis (ICD10-I70.0). Coronary artery calcification.   12/17/2021 Imaging   MRI lumbar spine w wo contrast  1. No evidence of regional metastatic disease. 2. Late subacute superior endplate fracture at L2 and large superior endplate Schmorl's node at L5, unchanged since the PET scan of 10/29/2021. 3. L3-4: Shallow disc protrusion. Facet and ligamentous hypertrophy. Stenosis of both lateral recesses. Findings slightly worsened since 2022. 4. L4-5: Shallow disc protrusion. Facet and ligamentous hypertrophy. Small synovial cyst arising from the facet joint on the right. Stenosis of the lateral recesses right worse than left. Foraminal narrowing right worse than left. Findings have worsened since 2022.  5. L5-S1: Endplate osteophytes and shallow protrusion of the disc. Facet and ligamentous hypertrophy. Stenosis of the subarticular lateral  recesses and neural foramina, right worse than left. Similar appearance to the study of 2022. 6. Continued evidence of enteritis of at least 1 loop of small bowel. Distended bladder present   12/18/2021 Imaging   Brian MRI w wo  1. Interval decrease in size  of the left cerebellar and vermian hematomas. No definite evidence of underlying metastatic lesion. 2. There is are two possible contrast enhancing lesions in the posterior left frontal lobe and left occipital lobe, which do not have intrinsic T1 signal abnormality, but have a somewhat linear appearance and may be vascular in nature. Recommend attention on follow up. 3. Multifocal sites of susceptibility artifact are redemonstrated with interval development of a few new foci, as described above. All of these sites demonstrate intrinsic T1 hyperintense signal abnormality and susceptibility artifact and are compatible with sites of microhemorrhages. Recommend continued attention on follow up. 4. Interval decrease in size of right parietal calvarial metastatic lesion. No new contrast enhancing lesions visualized. 5. Diffusely heterogeneous marrow signal throughout the cervical spine, which is nonspecific but can be seen in the setting of anemia, smoking, obesity, or a marrow replacement process.     12/18/2021 Imaging   MRI lumbar spine w wo contrast  1. No evidence of regional metastatic disease. 2. Late subacute superior endplate fracture at L2 and large superior endplate Schmorl's node at L5, unchanged since the PET scan of 10/29/2021 3. L3-4: Shallow disc protrusion. Facet and ligamentous hypertrophy.Stenosis of both lateral recesses. Findings slightly worsened since 2022. 4. L4-5: Shallow disc protrusion. Facet and ligamentous hypertrophy. Small synovial cyst arising from the facet joint on the right. Stenosis of the lateral recesses right worse than left. Foraminal narrowing right worse than left. Findings have worsened since 2022. 5. L5-S1: Endplate osteophytes and shallow protrusion of the disc. Facet and ligamentous hypertrophy. Stenosis of the subarticular lateral recesses and neural foramina, right worse than left. Similar appearance to the study of 2022. 6. Continued evidence of  enteritis of at least 1 loop of small bowel. Distended bladder present.       01/28/2022 Imaging   CT chest abdomen pelvis w contrast 1. Treated left upper lobe mass appears grossly stable to the prior examination when measured in a similar fashion on the prior study. No definite signs of extra skeletal metastatic disease noted in the chest, abdomen or pelvis. 2. Widespread skeletal metastases redemonstrated, as above. 3. Hepatic steatosis. 4. Aortic atherosclerosis, in addition to left main and three-vessel coronary artery disease. Assessment for potential risk factor modification, dietary therapy or pharmacologic therapy may be warranted, if clinically indicated. 5. Additional incidental findings,    04/08/2022 Imaging   MRI lumbar spine w wo contrast  1. Stable remote superior endplate fracture of L2 and large Schmorl to noted involving L5. No worrisome bone lesions to suggest metastatic disease. 2. Degenerative lumbar spondylosis with multilevel disc disease and facet disease which appears relatively stable as detailed above. 3. Enlarging right-sided synovial cyst at L4-5 with progressive mass effect on the right side of the thecal sac. This could potentially be symptomatic   04/28/2022 Imaging   CT chest abdomen pelvis wo contrast  1. Similar versus slightly decreased size of treated left upper lobe primary bronchogenic carcinoma. 2. Similar osseous metastasis. 3. No evidence of extraosseous metastatic disease. 4. New small right pleural effusion. 5. Coronary artery atherosclerosis. Aortic Atherosclerosis   07/03/2022 Imaging   PET scan showed 1. Interval development of a small focus of intense hypermetabolism within the left upper  lobe scar. This is associated with a 5 mm perifissural nodule in the left lung which is hypermetabolic. Hypermetabolism at both of these locations is new since prior PET-CT and highly suspicious for recurrent disease. 2. No evidence for hypermetabolic soft  tissue metastatic disease in the neck, abdomen, or pelvis. 3. Similar appearance of sclerotic and lytic bone lesions without hypermetabolism on PET imaging. 4. Focal hypermetabolism associated with a fracture of the right L4 transverse process. The fracture was visible on the previous CT of 04/28/2022. No associated soft tissue lesion to suggest that this is pathologic in nature. Tracer accumulation on today's study is in keeping with healing fracture. There is also some minimal uptake in a prominent spur associated with the L4 superior endplate, most likely degenerative. 5.  Aortic Atherosclerosis    08/01/2022 - 08/14/2022 Radiation Therapy   Radiation to left lung.    10/01/2022 Imaging   chest angiogram PE protocol  No pulmonary embolism identified.   Stable nodular opacity in the left upper lobe. Please correlate for history of treated lung cancer. There is an adjacent small nodule along the course of the interlobar fissure which is slightly larger today compared to the study of February 2024. please correlate with findings of the recent PET-CT of 07/03/2022.      10/02/2022 Imaging   MRI brain with and without contrast 1. No acute intracranial abnormality. No evidence for intraparenchymal metastatic disease. 2. Chronic left cerebellar hemorrhage with multiple additional chronic micro hemorrhages elsewhere throughout the brain, stable. 3. Underlying mild chronic microvascular ischemic disease.    # Patient has a history of melanoma on his neck, treated in 2009. # History of hemorrhagic infarct left cerebellum  INTERVAL HISTORY Micheal Donovan is a 72 y.o. male who has above history reviewed by me today presents for follow up visit for management of  Stage IV lung adenocarcianoma # Pulmonary embolism, SOB is stable, not worse. Off Eliquis due to MRI findings.  + back pain, and left lower extremity weakness due to pain, on fentanyl patch Q72 hours and percocet, he sees orthopedic  surgeon for epidural injections, Accompanied by wife.  10/01/2022 - 10/06/2022 patient was admitted to the hospital due to lower extremity weakness and intermittent aphasia. Patient reports acute on chronic left lower extremity weakness, weakness has been more prominent over the past 2 weeks.  He reports significant episode of difficulty with ambulation 2 days after his last Keytruda treatments.  He reports that he felt loss of control of muscle at that time.  Patient reports that the weakness is different than her chronic lower extremity  weakness.  Patient was found to have orthostatic hypotension Flomax irbesartan were held.  CK wnl,   Neurology was consulted and started the patient on Keppra for possible seizure.  EEG was negative.    Review of Systems  Constitutional:  Positive for appetite change, fatigue and unexpected weight change. Negative for chills and fever.  HENT:   Negative for hearing loss and voice change.   Eyes:  Negative for eye problems and icterus.  Respiratory:  Positive for shortness of breath. Negative for chest tightness and cough.   Cardiovascular:  Positive for leg swelling. Negative for chest pain.  Gastrointestinal:  Negative for abdominal distention, abdominal pain and blood in stool.  Endocrine: Negative for hot flashes.  Genitourinary:  Negative for difficulty urinating, dysuria and frequency.   Musculoskeletal:  Positive for back pain. Negative for arthralgias.  Skin:  Negative for itching and rash.  Neurological:  Negative for extremity weakness, headaches, light-headedness and numbness.  Hematological:  Negative for adenopathy. Does not bruise/bleed easily.  Psychiatric/Behavioral:  Positive for sleep disturbance. Negative for confusion.     MEDICAL HISTORY:  Past Medical History:  Diagnosis Date   Allergy    BPH (benign prostatic hypertrophy)    Cancer (HCC)    Melanoma on Neck    2008   Carotid artery occlusion    CHF (congestive heart failure)  (HCC)    Diabetes mellitus    type 2   ED (erectile dysfunction)    GERD (gastroesophageal reflux disease)    Hemorrhagic stroke (HCC) 06/2021   Hyperlipidemia    Hypertension    Lung cancer (HCC)    Neoplasm related pain    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)     SURGICAL HISTORY: Past Surgical History:  Procedure Laterality Date   BRONCHIAL NEEDLE ASPIRATION BIOPSY  06/17/2021   Procedure: BRONCHIAL NEEDLE ASPIRATION BIOPSIES;  Surgeon: Lorin Glass, MD;  Location: [facility] ENDOSCOPY;  Service: Pulmonary;;   IR IMAGING GUIDED PORT INSERTION  08/05/2021   MELANOMA EXCISION  2008   Left side of neck   RADIOLOGY WITH ANESTHESIA N/A 04/05/2020   Procedure: MRI SPINE WITOUT CONTRAST;  Surgeon: Radiologist, Medication, MD;  Location: [facility] OR;  Service: Radiology;  Laterality: N/A;   RADIOLOGY WITH ANESTHESIA N/A 08/22/2021   Procedure: MRI BRAIN WITH AND WITHOUT CONTRAST  WITH ANESTHESIA;  Surgeon: Radiologist, Medication, MD;  Location: [facility] OR;  Service: Radiology;  Laterality: N/A;   RADIOLOGY WITH ANESTHESIA N/A 12/17/2021   Procedure: MRI BRAIN WITH AND WITHOUT CONTRAST WITH ANESTHESIA; MRI LUMBER WITH AND WITHOUT CONTRAST;  Surgeon: Radiologist, Medication, MD;  Location: [facility] OR;  Service: Radiology;  Laterality: N/A;   RADIOLOGY WITH ANESTHESIA N/A 04/08/2022   Procedure: MRI LUMBER SPINE WITH AND WITHOUT CONTRAST;  Surgeon: Radiologist, Medication, MD;  Location: [facility] OR;  Service: Radiology;  Laterality: N/A;   TONSILLECTOMY     VIDEO BRONCHOSCOPY WITH ENDOBRONCHIAL ULTRASOUND N/A 06/17/2021   Procedure: VIDEO BRONCHOSCOPY WITH ENDOBRONCHIAL ULTRASOUND;  Surgeon: Lorin Glass, MD;  Location: [facility] ENDOSCOPY;  Service: Pulmonary;  Laterality: N/A;    SOCIAL HISTORY: Social History   Socioeconomic History   Marital status: Married    Spouse name: Not on file   Number of children: Not on file   Years of education: Not on file   Highest education level: Not on file  Occupational History    Not on file  Tobacco Use   Smoking status: Former    Current packs/day: 0.00    Types: Cigarettes    Quit date: 07/21/1990    Years since quitting: 32.2   Smokeless tobacco: Never  Vaping Use   Vaping status: Never Used  Substance and Sexual Activity   Alcohol use: No   Drug use: No   Sexual activity: Not Currently  Other Topics Concern   Not on file  Social History Narrative   Not on file   Social Determinants of Health   Financial Resource Strain: Low Risk  (04/17/2022)   Overall Financial Resource Strain (CARDIA)    Difficulty of Paying Living Expenses: Not hard at all  Food Insecurity: No Food Insecurity (10/01/2022)   Hunger Vital Sign    Worried About Running Out of Food in the Last Year: Never true    Ran Out of Food in the Last Year: Never true  Transportation Needs: No Transportation Needs (10/01/2022)   PRAPARE -  Administrator, Civil Service (Medical): No    Lack of Transportation (Non-Medical): No  Physical Activity: Inactive (03/27/2022)   Exercise Vital Sign    Days of Exercise per Week: 0 days    Minutes of Exercise per Session: 0 min  Stress: Stress Concern Present (03/27/2022)   [employer] of Occupational Health - Occupational Stress Questionnaire    Feeling of Stress : To some extent  Social Connections: Moderately Isolated (03/27/2022)   Social Connection and Isolation Panel [NHANES]    Frequency of Communication with Friends and Family: More than three times a week    Frequency of Social Gatherings with Friends and Family: Three times a week    Attends Religious Services: Never    Active Member of Clubs or Organizations: No    Attends Banker Meetings: Never    Marital Status: Married  Catering manager Violence: Not At Risk (10/01/2022)   Humiliation, Afraid, Rape, and Kick questionnaire    Fear of Current or Ex-Partner: No    Emotionally Abused: No    Physically Abused: No    Sexually Abused: No    FAMILY  HISTORY: Family History  Problem Relation Age of Onset   COPD Mother    Heart disease Father    Heart disease Brother        MI at age 2   Stroke Neg Hx     ALLERGIES:  is allergic to niaspan [niacin].  MEDICATIONS:  Current Outpatient Medications  Medication Sig Dispense Refill   aspirin EC 81 MG tablet Take 1 tablet (81 mg total) by mouth daily. Swallow whole. 30 tablet 12   docusate sodium (COLACE) 100 MG capsule Take 100 mg by mouth 2 (two) times daily as needed for mild constipation.     fentaNYL (DURAGESIC) 75 MCG/HR Place 1 patch onto the skin every 3 (three) days. 10 patch 0   finasteride (PROSCAR) 5 MG tablet Take 1 tablet (5 mg total) by mouth daily. 30 tablet 0   gabapentin (NEURONTIN) 300 MG capsule TAKE 1 CAPSULE(300 MG) BY MOUTH THREE TIMES DAILY 90 capsule 1   levETIRAcetam (KEPPRA) 500 MG tablet Take 1 tablet (500 mg total) by mouth 2 (two) times daily. 60 tablet 0   megestrol (MEGACE) 40 MG tablet Take 1 tablet (40 mg total) by mouth daily. 30 tablet 3   midodrine (PROAMATINE) 10 MG tablet Take 1 tablet (10 mg total) by mouth 3 (three) times daily with meals. 90 tablet 0   Multiple Vitamin (MULTIVITAMIN WITH MINERALS) TABS tablet Take 1 tablet by mouth daily.     NOVOLOG FLEXPEN 100 UNIT/ML FlexPen Inject 0-10 Units into the skin daily as needed for high blood sugar (BG > 200).     Omeprazole 20 MG TBEC Take 20 mg by mouth daily.     traZODone (DESYREL) 50 MG tablet Take 1 tablet (50 mg total) by mouth at bedtime as needed for sleep. 30 tablet 3   No current facility-administered medications for this visit.   Facility-Administered Medications Ordered in Other Visits  Medication Dose Route Frequency Provider Last Rate Last Admin   heparin lock flush 100 UNIT/ML injection              PHYSICAL EXAMINATION:  Vitals:   10/20/22 1429  BP: 132/76  Pulse: 87  Resp: 18  Temp: (!) 96.2 F (35.7 C)  SpO2: 100%   Filed Weights   10/20/22 1429  Weight: 120 lb  14.4 oz (54.8  kg)    Physical Exam Constitutional:      General: He is not in acute distress.    Appearance: He is ill-appearing.  HENT:     Head: Normocephalic and atraumatic.  Eyes:     General: No scleral icterus. Cardiovascular:     Rate and Rhythm: Normal rate and regular rhythm.     Heart sounds: Normal heart sounds.  Pulmonary:     Effort: Pulmonary effort is normal. No respiratory distress.     Breath sounds: No wheezing.     Comments: Decreased breath sound bilaterally. Abdominal:     General: Bowel sounds are normal. There is no distension.     Palpations: Abdomen is soft.  Musculoskeletal:        General: No deformity. Normal range of motion.     Cervical back: Normal range of motion and neck supple.     Comments: Bilateral ankle trace edema.  Skin:    General: Skin is warm and dry.     Findings: No rash.  Neurological:     Mental Status: He is alert. Mental status is at baseline.     Cranial Nerves: No cranial nerve deficit.     Coordination: Coordination normal.  Psychiatric:        Mood and Affect: Mood normal.     LABORATORY DATA:  I have reviewed the data as listed    Latest Ref Rng & Units 10/20/2022    1:55 PM 10/09/2022    3:00 PM 10/06/2022    4:31 AM  CBC  WBC 4.0 - 10.5 K/uL 6.3  6.2  6.4   Hemoglobin 13.0 - 17.0 g/dL 55.7  32.2  9.1   Hematocrit 39.0 - 52.0 % 34.2  33.8  28.6   Platelets 150 - 400 K/uL 228  364  278       Latest Ref Rng & Units 10/20/2022    1:55 PM 10/09/2022    3:00 PM 10/06/2022    4:31 AM  CMP  Glucose 70 - 99 mg/dL 025  427  062   BUN 8 - 23 mg/dL 20  21  22    Creatinine 0.61 - 1.24 mg/dL 3.76  2.83  1.51   Sodium 135 - 145 mmol/L 133  136  135   Potassium 3.5 - 5.1 mmol/L 3.9  4.6  4.0   Chloride 98 - 111 mmol/L 104  103  106   CO2 22 - 32 mmol/L 22  24  21    Calcium 8.9 - 10.3 mg/dL 8.7  9.6  8.9   Total Protein 6.5 - 8.1 g/dL 7.1  7.0    Total Bilirubin 0.3 - 1.2 mg/dL 0.5  0.3    Alkaline Phos 38 - 126 U/L 69      AST 15 - 41 U/L 18  14    ALT 0 - 44 U/L 13  7       Iron/TIBC/Ferritin/ %Sat    Component Value Date/Time   IRON 60 02/03/2022 0958   TIBC 391 02/03/2022 0958   FERRITIN 704 (H) 02/03/2022 0958   IRONPCTSAT 15 (L) 02/03/2022 7616      RADIOGRAPHIC STUDIES: I have personally reviewed the radiological images as listed and agreed with the findings in the report. EEG adult  Result Date: 10/02/2022 Jefferson Fuel, MD     10/03/2022  3:52 PM Routine EEG Report DEMETRIA MOLLOY is a 72 y.o. male with a history of seizure who is undergoing an EEG to  evaluate for seizures. Report: This EEG was acquired with electrodes placed according to the International 10-20 electrode system (including Fp1, Fp2, F3, F4, C3, C4, P3, P4, O1, O2, T3, T4, T5, T6, A1, A2, Fz, Cz, Pz). The following electrodes were missing or displaced: none. The occipital dominant rhythm was 7-8 Hz. This activity is reactive to stimulation. Drowsiness was manifested by background fragmentation; deeper stages of sleep were identified by K complexes and sleep spindles. There was no focal slowing. There were no interictal epileptiform discharges. There were no electrographic seizures identified. There was no abnormal response to photic stimulation or hyperventilation. Impression and clinical correlation: This EEG was obtained while awake and asleep and is abnormal due to mild diffuse slowing indicative of global cerebral dysfunction. Epileptiform abnormalities were not seen during this recording. Bing Neighbors, MD [facility] [phone] If 7pm- 7am, please page neurology on call as listed in AMION.   MR Lumbar Spine W Wo Contrast  Result Date: 10/02/2022 CLINICAL DATA:  Initial evaluation for lumbar radiculopathy. Infection suspected. History of lung cancer. EXAM: MRI LUMBAR SPINE WITHOUT AND WITH CONTRAST TECHNIQUE: Multiplanar and multiecho pulse sequences of the lumbar spine were obtained without and with intravenous  contrast. CONTRAST:  6mL GADAVIST GADOBUTROL 1 MMOL/ML IV SOLN COMPARISON:  Prior study from 04/08/2022. FINDINGS: Segmentation: Standard. Lowest well-formed disc space labeled the L5-S1 level. Alignment: 4 mm retrolisthesis of L5 on S1. Straightening with mild reversal of the normal lumbar lordosis. Vertebrae: Chronic compression deformity involving the superior endplate of L2, stable. Large endplate Schmorl's node deformity with associated height loss at the superior endplate of L5, also stable. Smaller endplate Schmorl's node deformity with mild height loss at the superior endplate of L3 noted as well, also stable. Vertebral body height otherwise maintained with no acute or interval fracture. Underlying bone marrow signal intensity within normal limits. A few small T1 hypointense lesions seen about the visualized posterior iliac wings bilaterally, largest of which seen on the left and measures 8 mm (series 12, image 33). These correspond with previously seen small sclerotic lesions, presumably reflecting patient's known osseous metastases. These were not hypermetabolic on prior PET-CT from 09/81/1914. Mild degenerative reactive endplate changes noted about the L3-4 and L5-S1 interspaces. No evidence for osteomyelitis discitis or septic arthritis. Conus medullaris and cauda equina: Conus extends to the L1-2 level. Conus and cauda equina appear normal. Paraspinal and other soft tissues: Paraspinous soft tissues demonstrate no acute finding. Chronic fatty atrophy noted about the posterior paraspinous and psoas musculature. Disc levels: L1-2: Disc desiccation with mild disc bulge. Mild facet spurring. No spinal stenosis. Foramina remain patent. L2-3: Disc desiccation with mild disc bulge. No spinal stenosis. Foramina remain patent. L3-4: Degenerative intervertebral disc space narrowing with diffuse disc bulge. Associated reactive endplate spurring. Mild bilateral facet hypertrophy. Resultant mild left greater than  right lateral recess stenosis. Central canal remains patent. Mild to moderate left with mild right L3 foraminal stenosis. L4-5: Mild disc bulge with reactive endplate spurring. Mild to moderate facet hypertrophy. Resultant mild to moderate bilateral subarticular stenosis. Central canal remains patent. Mild to moderate left with moderate right L4 foraminal stenosis. L5-S1: Retrolisthesis with degenerative intervertebral disc space narrowing. Central disc protrusion with slight inferior angulation and annular fissure indents the ventral thecal sac (series 12, image 33). Mild bilateral facet hypertrophy. Resultant mild bilateral subarticular stenosis. Central canal remains patent. Moderate bilateral L5 foraminal narrowing. IMPRESSION: 1. No MRI evidence for acute infection or other abnormality within the lumbar spine. 2. Few subcentimeter T1  hypointense lesions about the visualized posterior iliac wings. These correspond with small sclerotic foci seen on most recent CT from 04/08/2022, presumably reflecting patient's known osseous metastatic disease. These were not hypermetabolic on prior PET-CT from 83/15/1761. 3. No other evidence for metastatic disease within the lumbar spine itself. 4. Chronic L2 compression fracture, stable. Large Schmorl's node deformity with associated height loss at L5, also stable. 5. Multifactorial degenerative changes at L3-4 through L5-S1 with resultant mild to moderate bilateral subarticular stenosis, with mild to moderate bilateral L3 through L5 foraminal narrowing as above. Electronically Signed   By: Rise Mu M.D.   On: 10/02/2022 05:22   MR BRAIN W WO CONTRAST  Result Date: 10/02/2022 CLINICAL DATA:  Initial evaluation for TIA. EXAM: MRI HEAD WITHOUT AND WITH CONTRAST TECHNIQUE: Multiplanar, multiecho pulse sequences of the brain and surrounding structures were obtained without and with intravenous contrast. CONTRAST:  6mL GADAVIST GADOBUTROL 1 MMOL/ML IV SOLN  COMPARISON:  Prior Study from 10/01/2022 and MRI from 05/14/2022. FINDINGS: Brain: Cerebral volume within normal limits. Mild chronic microvascular ischemic disease noted involving the periventricular and deep white matter both cerebral hemispheres as well as the pons. No evidence for acute or subacute ischemia. Gray-white matter differentiation maintained. No acute intracranial hemorrhage. Encephalomalacia with susceptibility artifact at the left cerebellum related to prior hemorrhage at this location. Multiple additional scattered chronic micro hemorrhages seen elsewhere throughout the brain, stable from prior, and consistent with prior hemorrhages. No mass lesion, midline shift or mass effect. No hydrocephalus or extra-axial fluid collection. Pituitary gland and suprasellar region within normal limits. No abnormal enhancement or evidence for intraparenchymal metastatic disease. Vascular: Loss of normal flow void within the left V4 segment, likely occluded, stable. Major intracranial vascular flow voids are otherwise maintained. Skull and upper cervical spine: Craniocervical junction within normal limits. Diffusely heterogeneous signal intensity seen throughout the visualized bone marrow. Previously identified right parietal calvarial lesion again noted, stable. Small lipoma noted at the left frontal scalp. Sinuses/Orbits: Prior bilateral ocular lens replacement. Scattered mucosal thickening noted about the ethmoidal air cells and maxillary sinuses. No significant mastoid effusion. Other: None. IMPRESSION: 1. No acute intracranial abnormality. No evidence for intraparenchymal metastatic disease. 2. Chronic left cerebellar hemorrhage with multiple additional chronic micro hemorrhages elsewhere throughout the brain, stable. 3. Underlying mild chronic microvascular ischemic disease. Electronically Signed   By: Rise Mu M.D.   On: 10/02/2022 05:05   CT Angio Head Neck W WO CM  Result Date:  10/01/2022 CLINICAL DATA:  Neuro deficit, acute, stroke suspected. Generalized weakness. History of lung cancer and intracranial hemorrhage. EXAM: CT ANGIOGRAPHY HEAD AND NECK WITH AND WITHOUT CONTRAST TECHNIQUE: Multidetector CT imaging of the head and neck was performed using the standard protocol during bolus administration of intravenous contrast. Multiplanar CT image reconstructions and MIPs were obtained to evaluate the vascular anatomy. Carotid stenosis measurements (when applicable) are obtained utilizing NASCET criteria, using the distal internal carotid diameter as the denominator. RADIATION DOSE REDUCTION: This exam was performed according to the departmental dose-optimization program which includes automated exposure control, adjustment of the mA and/or kV according to patient size and/or use of iterative reconstruction technique. CONTRAST:  OMNIPAQUE IOHEXOL 350 MG/ML SOLN COMPARISON:  CTA head and neck 06/12/2021 FINDINGS: CTA NECK FINDINGS Aortic arch: Standard 3 vessel aortic arch with mixed calcified and soft plaque. No significant stenosis of the arch vessel origins. Right carotid system: Patent with calcified and soft plaque about the carotid bifurcation resulting in 60% stenosis of the proximal  ICA, progressed from prior. Left carotid system: Patent with heavily calcified plaque about the carotid bifurcation resulting in 75% or greater stenosis, mildly progressed from prior. Vertebral arteries: Patent with the right vertebral artery being dominant. Moderate to severe stenosis of the left vertebral artery origin, likely mildly progressed. Progressive, moderate left P3 stenosis. Skeleton: Interbody and left facet ankylosis at C4-5. Widespread advanced disc degeneration elsewhere in the cervical spine. Advanced facet arthrosis on the right at C2-3 and on the left at C3-4. Other neck: No enlarged or suspicious lymph nodes in the neck. Upper chest: Partially visualized left upper lobe chronic  consolidation/fibrosis in the left upper lobe, more fully evaluated on the 07/03/2022 PET-CT. Review of the MIP images confirms the above findings CTA HEAD FINDINGS Anterior circulation: The internal carotid arteries are patent from skull base to carotid termini. Soft plaque in the proximal right cavernous ICA has progressed and now results in a severe stenosis, and there is also new mild stenosis of the proximal left cavernous ICA. ACAs and MCAs are patent with branch vessel irregularity but no evidence of a proximal branch occlusion or significant proximal stenosis. No aneurysm is identified. Posterior circulation: The intracranial right vertebral artery is patent with moderate stenosis again noted. The left V4 segment is again noted to be heavily diseased with multiple severe stenoses and/or short segment occlusions. Fusiform aneurysmal dilatation to 4 mm diameter of the mid left V4 segment is unchanged. Patent PICA, AICA, and SCA origins are visualized bilaterally. The basilar artery is patent with atherosclerotic irregularity but no flow limiting stenosis. There are left larger than right posterior communicating arteries with hypoplasia of the left P1 segment. Both PCAs are patent without evidence of a significant proximal stenosis. Venous sinuses: Not well evaluated due to arterial contrast timing. Anatomic variants: Predominantly fetal type origin of the left PCA. Review of the MIP images confirms the above findings IMPRESSION: 1. No emergent large vessel occlusion. 2. Progressive atherosclerosis in the head and neck including a new severe right cavernous ICA stenosis. 3. Progressive bilateral proximal cervical ICA stenoses, 75% or greater on the left and 60% on the right. 4. Moderate to severe stenosis of the left vertebral artery origin and severe multifocal stenoses and/or segmental occlusions of the left V4 segment. 5. 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 6.  Aortic Atherosclerosis  (ICD10-I70.0). Electronically Signed   By: Sebastian Ache M.D.   On: 10/01/2022 16:33   CT Angio Chest PE W/Cm &/Or Wo Cm  Result Date: 10/01/2022 CLINICAL DATA:  Weakness. History of non-small-cell lung cancer left upper lobe. EXAM: CT ANGIOGRAPHY CHEST WITH CONTRAST TECHNIQUE: Multidetector CT imaging of the chest was performed using the standard protocol during bolus administration of intravenous contrast. Multiplanar CT image reconstructions and MIPs were obtained to evaluate the vascular anatomy. RADIATION DOSE REDUCTION: This exam was performed according to the departmental dose-optimization program which includes automated exposure control, adjustment of the mA and/or kV according to patient size and/or use of iterative reconstruction technique. CONTRAST:  OMNIPAQUE IOHEXOL 350 MG/ML SOLN COMPARISON:  PET-CT 07/03/2022.  Chest CT 04/28/2022 FINDINGS: Cardiovascular: Small pericardial effusion. Heart is nonenlarged. Coronary artery calcifications are seen. The thoracic aorta has a normal course and caliber with partially calcified plaque. No segmental or larger pulmonary embolism identified. Right upper chest port in place with the tip along the upper right atrium. Mediastinum/Nodes: No specific abnormal lymph node enlargement identified in the axillary region, hilum or mediastinum. Mildly patulous thoracic esophagus.  Small heterogeneous thyroid gland. Lungs/Pleura: There is some linear opacity seen in the right lung base likely scar or atelectasis. No right-sided consolidation, pneumothorax or effusion. Left upper lobe perihilar nodular opacity once again identified. Previously this measured 3.9 x 1.9 cm and today 3.6 by 1.8 cm on series 6, image 61. No left-sided new consolidation, pneumothorax or effusion. There is smaller area of nodular thickening along the interlobar fissure. On series 6, image 64 this measures 7 mm today and previously with smaller at 4 mm. Upper Abdomen: Along the upper abdomen  the visualized portions of the adrenal glands are preserved. There is some contrast in the right renal collecting system from prior contrast administration. Musculoskeletal: Mild degenerative changes seen along the spine. There is sclerotic portion seen to the left seventh rib, unchanged from previous. Review of the MIP images confirms the above findings. IMPRESSION: No pulmonary embolism identified. Stable nodular opacity in the left upper lobe. Please correlate for history of treated lung cancer. There is an adjacent small nodule along the course of the interlobar fissure which is slightly larger today compared to the study of February 2024. please correlate with findings of the recent PET-CT of 07/03/2022. Aortic Atherosclerosis (ICD10-I70.0). Electronically Signed   By: Karen Kays M.D.   On: 10/01/2022 16:31   CT Head Wo Contrast  Result Date: 10/01/2022 CLINICAL DATA:  Syncope/presyncope, cerebrovascular cause suspected EXAM: CT HEAD WITHOUT CONTRAST TECHNIQUE: Contiguous axial images were obtained from the base of the skull through the vertex without intravenous contrast. RADIATION DOSE REDUCTION: This exam was performed according to the departmental dose-optimization program which includes automated exposure control, adjustment of the mA and/or kV according to patient size and/or use of iterative reconstruction technique. COMPARISON:  MRI Head 05/18/22 FINDINGS: Brain: Evolving hemorrhage in the left cerebellar hemisphere. No CT evidence of an acute cortical infarct. No new sites of hemorrhage visualized. No extra-axial fluid collection. Sequela of mild chronic microvascular ischemic change. No hydrocephalus. Vascular: No hyperdense vessel or unexpected calcification. Skull: Normal. Negative for fracture or focal lesion. Sinuses/Orbits: No middle ear or mastoid effusion. Paranasal sinuses are notable for frothy secretions in the right sphenoid sinus. Bilateral lens replacement. Orbits are otherwise  unremarkable. Other: None. IMPRESSION: 1. Evolving hemorrhage in the left cerebellar hemisphere. No new sites of hemorrhage visualized. 2. Frothy secretions in the right sphenoid sinus, which can be seen in the setting of acute sinusitis. Electronically Signed   By: Lorenza Cambridge M.D.   On: 10/01/2022 15:28

## 2022-10-20 NOTE — Assessment & Plan Note (Signed)
Previous MRI brain shows persistent microbleeds.   Off Eliquis 2.5mg BID.  

## 2022-10-20 NOTE — Assessment & Plan Note (Signed)
Hold Eliquis.  Follow-up with Dr. Vaslow Repeat MRI brain-stable findings 

## 2022-10-20 NOTE — Assessment & Plan Note (Signed)
Fingertip neuropathy intermittent Grade 1, feet neuropathy persistent, Grade 2.  On Gabapentin 300mg  TID

## 2022-10-20 NOTE — Assessment & Plan Note (Addendum)
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  S/p lung radiation to left lung.  Labs are reviewed and discussed with patient. Hold off Keytruda.  It is unclear if [name] is contributing to his acute muscle weakness after treatments. He also receives Zometa on the same day.  Unclear if this is Zometa side effects. Patient has been on Keytruda and Zometa in the past and his acute muscle weakness is a new symptom for him. Patient has multiple comorbidities and a poor performance status.  Not a candidate for chemotherapy Show decision was made to give him 4 to 6 weeks of chemo break. Continue follow-up with palliative care service

## 2022-10-20 NOTE — Assessment & Plan Note (Signed)
 Recommend Zometa every 4- 6 weeks.-  hold off today due to possible upcoming surgery Continue calcium supplementation.  MRI lumbar with and without contrast showed  Stable remote superior endplate fracture of L2 and large Schmorl to noted involving L5. 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 PET scan shows stable bone lesions.  L4 fracture uptake likely benign- s/p nerve block

## 2022-10-20 NOTE — Addendum Note (Signed)
Addended by: [name] on: 10/20/2022 10:42 PM   Modules accepted: Level of Service

## 2022-10-20 NOTE — Assessment & Plan Note (Addendum)
Acute on chronic. Currently on Keppra for presumed seizure. Recommend patient to follow-up with Dr. Barbaraann Cao Follow-up with physical therapy

## 2022-11-05 ENCOUNTER — Other Ambulatory Visit: Payer: Self-pay | Admitting: Oncology

## 2022-11-10 ENCOUNTER — Inpatient Hospital Stay: Payer: PPO | Admitting: Oncology

## 2022-11-10 ENCOUNTER — Inpatient Hospital Stay: Payer: PPO

## 2022-11-12 DIAGNOSIS — M5416 Radiculopathy, lumbar region: Secondary | ICD-10-CM | POA: Diagnosis not present

## 2022-11-13 ENCOUNTER — Encounter: Payer: Self-pay | Admitting: Hospice and Palliative Medicine

## 2022-11-13 ENCOUNTER — Inpatient Hospital Stay: Payer: PPO | Attending: Radiation Oncology

## 2022-11-13 ENCOUNTER — Other Ambulatory Visit: Payer: Self-pay | Admitting: Oncology

## 2022-11-13 DIAGNOSIS — Z86711 Personal history of pulmonary embolism: Secondary | ICD-10-CM | POA: Insufficient documentation

## 2022-11-13 DIAGNOSIS — R531 Weakness: Secondary | ICD-10-CM | POA: Insufficient documentation

## 2022-11-13 DIAGNOSIS — Z7982 Long term (current) use of aspirin: Secondary | ICD-10-CM | POA: Insufficient documentation

## 2022-11-13 DIAGNOSIS — Z794 Long term (current) use of insulin: Secondary | ICD-10-CM | POA: Insufficient documentation

## 2022-11-13 DIAGNOSIS — K219 Gastro-esophageal reflux disease without esophagitis: Secondary | ICD-10-CM | POA: Insufficient documentation

## 2022-11-13 DIAGNOSIS — E785 Hyperlipidemia, unspecified: Secondary | ICD-10-CM | POA: Insufficient documentation

## 2022-11-13 DIAGNOSIS — C3412 Malignant neoplasm of upper lobe, left bronchus or lung: Secondary | ICD-10-CM | POA: Insufficient documentation

## 2022-11-13 DIAGNOSIS — Z5112 Encounter for antineoplastic immunotherapy: Secondary | ICD-10-CM | POA: Insufficient documentation

## 2022-11-13 DIAGNOSIS — I10 Essential (primary) hypertension: Secondary | ICD-10-CM | POA: Insufficient documentation

## 2022-11-13 DIAGNOSIS — I509 Heart failure, unspecified: Secondary | ICD-10-CM | POA: Insufficient documentation

## 2022-11-13 DIAGNOSIS — E119 Type 2 diabetes mellitus without complications: Secondary | ICD-10-CM | POA: Insufficient documentation

## 2022-11-13 DIAGNOSIS — C7951 Secondary malignant neoplasm of bone: Secondary | ICD-10-CM | POA: Insufficient documentation

## 2022-11-13 DIAGNOSIS — C7931 Secondary malignant neoplasm of brain: Secondary | ICD-10-CM | POA: Insufficient documentation

## 2022-11-13 DIAGNOSIS — G629 Polyneuropathy, unspecified: Secondary | ICD-10-CM | POA: Insufficient documentation

## 2022-11-13 DIAGNOSIS — E1140 Type 2 diabetes mellitus with diabetic neuropathy, unspecified: Secondary | ICD-10-CM | POA: Insufficient documentation

## 2022-11-13 DIAGNOSIS — E11319 Type 2 diabetes mellitus with unspecified diabetic retinopathy without macular edema: Secondary | ICD-10-CM | POA: Insufficient documentation

## 2022-11-13 DIAGNOSIS — G893 Neoplasm related pain (acute) (chronic): Secondary | ICD-10-CM | POA: Insufficient documentation

## 2022-11-13 DIAGNOSIS — Z923 Personal history of irradiation: Secondary | ICD-10-CM | POA: Insufficient documentation

## 2022-11-13 DIAGNOSIS — Z85820 Personal history of malignant melanoma of skin: Secondary | ICD-10-CM | POA: Insufficient documentation

## 2022-11-13 DIAGNOSIS — Z87891 Personal history of nicotine dependence: Secondary | ICD-10-CM | POA: Insufficient documentation

## 2022-11-13 NOTE — Progress Notes (Signed)
Nutrition Follow-up:   Patient with non small cell lung cancer, stage IV with brain and bone metastasis.  Patient has been on [place] and zometa but having issues with weakness.  Immunotherapy on hold currently.  Spoke with patient via phone for nutrition follow up.  Patient reports that appetite is fair.  Says that he is eating breakfast and supper.  Has been drinking some boost/ensure if does not eat a meal.  "I take a few big gulps and then gets that rest down.  This morning able to eat an egg croissant with a pepsi and steak and cheese sub for dinner last night (6 inch).   Says that PT came out recently for evaluation and should be starting PT soon.  Continues to feel weak.     Medications: megace  Labs: reviewed  Anthropometrics:   Weight 120 lb 14.4 oz on 8/19 132 lb (hospital wt) on 7/31 133 lb on 7/18 150 lb on 09/27/21   NUTRITION DIAGNOSIS: Inadequate oral intake improving   INTERVENTION:  Continue boost/ensure as able for added calories Stressed importance of good nutrition and working with PT to improve weakness Encouraged well balanced diet including foods high in protein    MONITORING, EVALUATION, GOAL: weight trends, intake   NEXT VISIT: Thursday, Oct 10th during infusion  Nikolos Billig B. Freida Busman, RD, LDN Registered Dietitian [phone]

## 2022-11-14 ENCOUNTER — Other Ambulatory Visit: Payer: Self-pay | Admitting: *Deleted

## 2022-11-14 DIAGNOSIS — C3412 Malignant neoplasm of upper lobe, left bronchus or lung: Secondary | ICD-10-CM

## 2022-11-14 DIAGNOSIS — G893 Neoplasm related pain (acute) (chronic): Secondary | ICD-10-CM

## 2022-11-14 MED ORDER — FENTANYL 75 MCG/HR TD PT72
1.0000 | MEDICATED_PATCH | TRANSDERMAL | 0 refills | Status: DC
Start: 2022-11-14 — End: 2022-12-23

## 2022-11-14 MED ORDER — OXYCODONE-ACETAMINOPHEN 7.5-325 MG PO TABS
1.0000 | ORAL_TABLET | ORAL | 0 refills | Status: DC | PRN
Start: 2022-11-14 — End: 2023-01-21

## 2022-11-18 ENCOUNTER — Encounter: Payer: Self-pay | Admitting: Hospice and Palliative Medicine

## 2022-11-19 ENCOUNTER — Inpatient Hospital Stay (HOSPITAL_BASED_OUTPATIENT_CLINIC_OR_DEPARTMENT_OTHER): Payer: PPO | Admitting: Hospice and Palliative Medicine

## 2022-11-19 DIAGNOSIS — Z87891 Personal history of nicotine dependence: Secondary | ICD-10-CM | POA: Diagnosis not present

## 2022-11-19 DIAGNOSIS — C3412 Malignant neoplasm of upper lobe, left bronchus or lung: Secondary | ICD-10-CM

## 2022-11-19 DIAGNOSIS — Z515 Encounter for palliative care: Secondary | ICD-10-CM

## 2022-11-19 DIAGNOSIS — F419 Anxiety disorder, unspecified: Secondary | ICD-10-CM

## 2022-11-19 MED ORDER — ALPRAZOLAM 0.5 MG PO TABS: 0.5000 mg | ORAL_TABLET | Freq: Every evening | ORAL | 0 refills | Status: DC | PRN

## 2022-11-19 MED ORDER — CITALOPRAM HYDROBROMIDE 10 MG PO TABS: 10.0000 mg | ORAL_TABLET | Freq: Every day | ORAL | 3 refills | Status: DC

## 2022-11-19 NOTE — Progress Notes (Signed)
Virtual Visit via Telephone Note  I connected with Micheal Donovan on 11/19/22 at  1:00 PM EDT by telephone and verified that I am speaking with the correct person using two identifiers.  Location: Patient: Home Provider: Clinic   I discussed the limitations, risks, security and privacy concerns of performing an evaluation and management service by telephone and the availability of in person appointments. I also discussed with the patient that there may be a patient responsible charge related to this service. The patient expressed understanding and agreed to proceed.   History of Present Illness: Micheal Donovan is a 72 y.o. male with multiple medical problems including non-small cell lung cancer, history of subacute intraparenchymal hemorrhage, PE.  Patient is status post XRT.  He is on systemic chemo.  He is referred to palliative care to address goals and manage ongoing symptoms.    Observations/Objective: Telephone visit today per patient's request to discuss anxiety.  Patient reports that he has had several instances recently where he is felt "panicked" and overall worse anxiety.  He has history of claustrophobia and has been prescribed alprazolam to facilitate imaging.  Patient states that he has been taking alprazolam intermittently when he feels anxious and that it has helped calm him.  Patient describes watching television and seeing someone be placed in confined spaces as being a primary trigger for his "panic attacks."  Discussed options for management.  Will refill his alprazolam but start him on daily citalopram.  Will refer him to LCSW for counseling and to work on cognitive behavioral strategies.  Assessment and Plan: Neoplasm related pain -continue fentanyl/Percocet.    Weakness -continue home health PT/OT  Anxiety -start citalopram 10 mg daily and consider increasing to 20 mg daily if tolerated.  Refill alprazolam 0.5 mg nightly as needed #15.  PDMP reviewed  Follow Up  Instructions: Follow-up telephone visit 1 month   I discussed the assessment and treatment plan with the patient. The patient was provided an opportunity to ask questions and all were answered. The patient agreed with the plan and demonstrated an understanding of the instructions.   The patient was advised to call back or seek an in-person evaluation if the symptoms worsen or if the condition fails to improve as anticipated.  I provided 8 minutes of non-face-to-face time during this encounter.   Micheal Moan, NP

## 2022-11-24 ENCOUNTER — Inpatient Hospital Stay: Payer: PPO

## 2022-11-24 NOTE — Progress Notes (Signed)
[facility] Clinical Social Work  Initial Assessment   Micheal Donovan is a 72 y.o. year old male contacted by phone. Clinical Social Work was referred by medical provider, [employer], for assessment of psychosocial needs.   SDOH (Social Determinants of Health) assessments performed: Yes SDOH Interventions    Flowsheet Row Clinical Support from 03/27/2022 in [facility] Telephone from 11/21/2021 in [facility] Care Coordination Clinical Support from 07/31/2021 in [facility] at [facility]  SDOH Interventions     Food Insecurity Interventions Intervention Not Indicated -- Intervention Not Indicated  Housing Interventions Intervention Not Indicated -- Intervention Not Indicated  Transportation Interventions Intervention Not Indicated Intervention Not Indicated [facility] Zenaida Niece ([facility] Only), Patient Resources (Friends/Family)  Utilities Interventions Intervention Not Indicated -- --  Alcohol Usage Interventions Intervention Not Indicated (Score <7) -- --  Financial Strain Interventions Intervention Not Indicated Intervention Not Indicated Intervention Not Indicated  Physical Activity Interventions Intervention Not Indicated -- Intervention Not Indicated  Stress Interventions Intervention Not Indicated -- --  Social Connections Interventions Intervention Not Indicated -- Intervention Not Indicated       SDOH Screenings   Food Insecurity: No Food Insecurity (10/01/2022)  Housing: Low Risk  (10/01/2022)  Transportation Needs: No Transportation Needs (10/01/2022)  Utilities: Not At Risk (10/01/2022)  Alcohol Screen: Low Risk  (03/27/2022)  Depression (PHQ2-9): High Risk (04/21/2022)  Financial Resource Strain: Low Risk  (04/17/2022)  Physical Activity: Inactive (03/27/2022)  Social Connections: Moderately Isolated (03/27/2022)  Stress: Stress Concern Present (03/27/2022)  Tobacco Use: Medium Risk (10/20/2022)     Distress  Screen completed: No    07/23/2021    9:33 AM  ONCBCN DISTRESS SCREENING  Distress experienced in past week (1-10) 7  Physical Problem type Pain  Physician notified of physical symptoms Yes      Family/Social Information:  Housing Arrangement: patient lives with his wife. Family members/support persons in your life? Family Transportation concerns: no  Employment: Retired  Income source: Actor concerns: No Type of concern: None Food access concerns: no Religious or spiritual practice: Yes-Patient identifies as Control and instrumentation engineer. Services Currently in place:  Medicare  Coping/ Adjustment to diagnosis: Patient understands treatment plan and what happens next? yes Concerns about diagnosis and/or treatment: I'm not especially worried about anything.  Patient stated he "did not know what happened that day" to make him so anxious.  He stated he took a Xanax and the the anxiety stopped.  He has not attempted cognitive behavioral therapy in the past. Patient reported stressors:  None Hopes and/or priorities: Family Patient enjoys time with family/ friends Current coping skills/ strengths: Manufacturing systems engineer , Contractor , and Supportive family/friends     SUMMARY: Current SDOH Barriers:  None identified by patient.  Clinical Social Work Clinical Goal(s):  No clinical social work goals at this time  Interventions: Discussed common feeling and emotions when being diagnosed with cancer, and the importance of support during treatment Informed patient of the support team roles and support services at [facility] Provided CSW contact information and encouraged patient to call with any questions or concerns Provided patient with information about the [employer] and CSW role.  Patient agreed to have CSW mail him contact information, [facility] calendar and [facility] calendar .  Patient declined therapy at this time.   Follow Up Plan: Patient will contact  CSW with any support or resource needs Patient verbalizes understanding of plan: Yes  Dorothey Baseman, LCSW Clinical Social Worker [facility]

## 2022-11-26 ENCOUNTER — Encounter: Payer: Self-pay | Admitting: Oncology

## 2022-12-01 ENCOUNTER — Inpatient Hospital Stay: Payer: PPO

## 2022-12-01 ENCOUNTER — Encounter: Payer: Self-pay | Admitting: Oncology

## 2022-12-01 ENCOUNTER — Other Ambulatory Visit: Payer: PPO

## 2022-12-01 ENCOUNTER — Inpatient Hospital Stay (HOSPITAL_BASED_OUTPATIENT_CLINIC_OR_DEPARTMENT_OTHER): Payer: PPO | Admitting: Oncology

## 2022-12-01 ENCOUNTER — Ambulatory Visit: Payer: PPO

## 2022-12-01 ENCOUNTER — Ambulatory Visit: Payer: PPO | Admitting: Oncology

## 2022-12-01 VITALS — BP 136/68 | HR 73 | Temp 96.3°F | Resp 18 | Wt 123.3 lb

## 2022-12-01 DIAGNOSIS — C7931 Secondary malignant neoplasm of brain: Secondary | ICD-10-CM | POA: Diagnosis not present

## 2022-12-01 DIAGNOSIS — R300 Dysuria: Secondary | ICD-10-CM

## 2022-12-01 DIAGNOSIS — Z86711 Personal history of pulmonary embolism: Secondary | ICD-10-CM | POA: Diagnosis not present

## 2022-12-01 DIAGNOSIS — Z87891 Personal history of nicotine dependence: Secondary | ICD-10-CM | POA: Diagnosis not present

## 2022-12-01 DIAGNOSIS — G629 Polyneuropathy, unspecified: Secondary | ICD-10-CM

## 2022-12-01 DIAGNOSIS — I2699 Other pulmonary embolism without acute cor pulmonale: Secondary | ICD-10-CM

## 2022-12-01 DIAGNOSIS — I619 Nontraumatic intracerebral hemorrhage, unspecified: Secondary | ICD-10-CM

## 2022-12-01 DIAGNOSIS — Z85820 Personal history of malignant melanoma of skin: Secondary | ICD-10-CM | POA: Diagnosis not present

## 2022-12-01 DIAGNOSIS — C3412 Malignant neoplasm of upper lobe, left bronchus or lung: Secondary | ICD-10-CM

## 2022-12-01 DIAGNOSIS — E1140 Type 2 diabetes mellitus with diabetic neuropathy, unspecified: Secondary | ICD-10-CM | POA: Diagnosis not present

## 2022-12-01 DIAGNOSIS — Z923 Personal history of irradiation: Secondary | ICD-10-CM | POA: Diagnosis not present

## 2022-12-01 DIAGNOSIS — C7951 Secondary malignant neoplasm of bone: Secondary | ICD-10-CM | POA: Diagnosis not present

## 2022-12-01 DIAGNOSIS — G893 Neoplasm related pain (acute) (chronic): Secondary | ICD-10-CM | POA: Diagnosis not present

## 2022-12-01 DIAGNOSIS — K219 Gastro-esophageal reflux disease without esophagitis: Secondary | ICD-10-CM | POA: Diagnosis not present

## 2022-12-01 DIAGNOSIS — R29898 Other symptoms and signs involving the musculoskeletal system: Secondary | ICD-10-CM

## 2022-12-01 DIAGNOSIS — I10 Essential (primary) hypertension: Secondary | ICD-10-CM | POA: Diagnosis not present

## 2022-12-01 DIAGNOSIS — E785 Hyperlipidemia, unspecified: Secondary | ICD-10-CM | POA: Diagnosis not present

## 2022-12-01 DIAGNOSIS — R531 Weakness: Secondary | ICD-10-CM | POA: Diagnosis not present

## 2022-12-01 DIAGNOSIS — E119 Type 2 diabetes mellitus without complications: Secondary | ICD-10-CM | POA: Diagnosis not present

## 2022-12-01 DIAGNOSIS — Z5112 Encounter for antineoplastic immunotherapy: Secondary | ICD-10-CM

## 2022-12-01 DIAGNOSIS — E11319 Type 2 diabetes mellitus with unspecified diabetic retinopathy without macular edema: Secondary | ICD-10-CM | POA: Diagnosis not present

## 2022-12-01 DIAGNOSIS — Z7982 Long term (current) use of aspirin: Secondary | ICD-10-CM | POA: Diagnosis not present

## 2022-12-01 DIAGNOSIS — Z794 Long term (current) use of insulin: Secondary | ICD-10-CM | POA: Diagnosis not present

## 2022-12-01 DIAGNOSIS — I509 Heart failure, unspecified: Secondary | ICD-10-CM | POA: Diagnosis not present

## 2022-12-01 LAB — CBC WITH DIFFERENTIAL/PLATELET
Abs Immature Granulocytes: 0.02 10*3/uL (ref 0.00–0.07)
Basophils Absolute: 0 10*3/uL (ref 0.0–0.1)
Basophils Relative: 1 %
Eosinophils Absolute: 0.5 10*3/uL (ref 0.0–0.5)
Eosinophils Relative: 8 %
HCT: 33.8 % — ABNORMAL LOW (ref 39.0–52.0)
Hemoglobin: 11.1 g/dL — ABNORMAL LOW (ref 13.0–17.0)
Immature Granulocytes: 0 %
Lymphocytes Relative: 11 %
Lymphs Abs: 0.6 10*3/uL — ABNORMAL LOW (ref 0.7–4.0)
MCH: 30.2 pg (ref 26.0–34.0)
MCHC: 32.8 g/dL (ref 30.0–36.0)
MCV: 91.8 fL (ref 80.0–100.0)
Monocytes Absolute: 0.5 10*3/uL (ref 0.1–1.0)
Monocytes Relative: 8 %
Neutro Abs: 4.3 10*3/uL (ref 1.7–7.7)
Neutrophils Relative %: 72 %
Platelets: 261 10*3/uL (ref 150–400)
RBC: 3.68 MIL/uL — ABNORMAL LOW (ref 4.22–5.81)
RDW: 14.6 % (ref 11.5–15.5)
WBC: 5.9 10*3/uL (ref 4.0–10.5)
nRBC: 0 % (ref 0.0–0.2)

## 2022-12-01 LAB — COMPREHENSIVE METABOLIC PANEL
ALT: 10 U/L (ref 0–44)
AST: 14 U/L — ABNORMAL LOW (ref 15–41)
Albumin: 3.3 g/dL — ABNORMAL LOW (ref 3.5–5.0)
Alkaline Phosphatase: 56 U/L (ref 38–126)
Anion gap: 7 (ref 5–15)
BUN: 21 mg/dL (ref 8–23)
CO2: 23 mmol/L (ref 22–32)
Calcium: 8.8 mg/dL — ABNORMAL LOW (ref 8.9–10.3)
Chloride: 104 mmol/L (ref 98–111)
Creatinine, Ser: 0.93 mg/dL (ref 0.61–1.24)
GFR, Estimated: >60 mL/min (ref >60)
Glucose, Bld: 125 mg/dL — ABNORMAL HIGH (ref 70–99)
Potassium: 3.5 mmol/L (ref 3.5–5.1)
Sodium: 134 mmol/L — ABNORMAL LOW (ref 135–145)
Total Bilirubin: 0.5 mg/dL (ref 0.3–1.2)
Total Protein: 6.9 g/dL (ref 6.5–8.1)

## 2022-12-01 MED ORDER — SODIUM CHLORIDE 0.9% FLUSH
10.0000 mL | INTRAVENOUS | Status: DC | PRN
  Administered 2022-12-01: 10 mL
  Filled 2022-12-01: qty 10

## 2022-12-01 MED ORDER — SODIUM CHLORIDE 0.9 % IV SOLN
200.0000 mg | Freq: Once | INTRAVENOUS | Status: AC
  Administered 2022-12-01: 200 mg via INTRAVENOUS
  Filled 2022-12-01: qty 200

## 2022-12-01 MED ORDER — HEPARIN SOD (PORK) LOCK FLUSH 100 UNIT/ML IV SOLN
500.0000 [IU] | Freq: Once | INTRAVENOUS | Status: AC | PRN
  Administered 2022-12-01: 500 [IU]
  Filled 2022-12-01: qty 5

## 2022-12-01 MED ORDER — SODIUM CHLORIDE 0.9 % IV SOLN
Freq: Once | INTRAVENOUS | Status: AC
  Filled 2022-12-01: qty 250

## 2022-12-01 NOTE — Assessment & Plan Note (Addendum)
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  S/p lung radiation to left lung.  Labs are reviewed and discussed with patient. Proceed with [name]

## 2022-12-01 NOTE — Assessment & Plan Note (Signed)
Immunotherapy plan as listed above 

## 2022-12-01 NOTE — Assessment & Plan Note (Signed)
Fingertip neuropathy intermittent Grade 1, feet neuropathy persistent, Grade 2.  On Gabapentin 300mg  TID

## 2022-12-01 NOTE — Assessment & Plan Note (Addendum)
Hold Eliquis.  Follow-up with Dr. Barbaraann Cao 10/02/22 Repeat MRI brain-stable findings

## 2022-12-01 NOTE — Assessment & Plan Note (Signed)
Acute on chronic. Currently on Keppra for presumed seizure. Recommend patient to follow-up with Dr. Barbaraann Cao Follow-up with physical therapy

## 2022-12-01 NOTE — Assessment & Plan Note (Signed)
 Recommend Zometa every 4- 6 weeks.-  hold off today due to possible upcoming surgery Continue calcium supplementation.  MRI lumbar with and without contrast showed  Stable remote superior endplate fracture of L2 and large Schmorl to noted involving L5. 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 PET scan shows stable bone lesions.  L4 fracture uptake likely benign- s/p nerve block

## 2022-12-01 NOTE — Progress Notes (Signed)
Hematology/Oncology Progress note [phone] [phone]       CHIEF COMPLAINTS/REASON FOR VISIT:  Metastatic non-small cell lung cancer  ASSESSMENT & PLAN:   Cancer Staging  Primary non-small cell carcinoma of upper lobe of left lung (HCC) Staging form: Lung, AJCC 8th Edition - Clinical: Stage IV (cT2b, cN1, cM1) - Signed by [name], MD on 08/06/2021   Primary non-small cell carcinoma of upper lobe of left lung (HCC) Stage IV lung adenocarcinoma with brain and bone metastasis.  S/p lung radiation to left lung.  Labs are reviewed and discussed with patient. Proceed with Keytruda   Intraparenchymal hemorrhage of brain (HCC) Hold Eliquis.  Follow-up with Dr. Barbaraann Cao 10/02/22 Repeat MRI brain-stable findings  Pulmonary embolus (HCC) Off Eliquis 2.5mg  BID due to concern of microbleeds in brain  Metastasis to bone (HCC) Recommend Zometa every 4- 6 weeks.-  hold off today due to possible upcoming surgery Continue calcium supplementation.  MRI lumbar with and without contrast showed  Stable remote superior endplate fracture of L2 and large Schmorl to noted involving L5. 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 PET scan shows stable bone lesions.  L4 fracture uptake likely benign- s/p nerve block    Encounter for antineoplastic immunotherapy Immunotherapy plan as listed above  Weakness of left lower extremity Acute on chronic. Currently on Keppra for presumed seizure. Recommend patient to follow-up with Dr. Barbaraann Cao Follow-up with physical therapy  Neuropathy Fingertip neuropathy intermittent Grade 1, feet neuropathy persistent, Grade 2.  On Gabapentin 300mg  TID      Follow up 3 weeks All questions were answered. The patient knows to call the clinic with any problems, questions or concerns.  [name], MD, [name] [facility] Hematology Oncology 12/01/2022      HISTORY OF PRESENTING  ILLNESS:   Micheal Donovan is a  72 y.o.  male presents for follow up of Non-small cell lung cancer.  Oncology History Overview Note  Diagnosis: Stage IIB T2b N1 M0 adenocarcinoma of the LUL, poorly differentiated     Primary non-small cell carcinoma of upper lobe of left lung (HCC)  06/13/2021 Initial Diagnosis   Primary non-small cell carcinoma of upper lobe of left lung [facility]) -06/20/2021 - 06/27/2021, patient presented to [facility] due to progressive headache/dizziness/gait changes. CT head showed acute to subacute intraparenchymal hemorrhage involving the left cerebellum.  Surrounding low-density vasogenic edema.  Patient was transferred to [facility]. This was further evaluated by CT angiogram of the neck which showed bulky calcified plaques/stenosis of carotid artery, Followed by MRI brain. 06/12/2021, MRI of the brain showed no significant interval change in size of the left cerebellar intraparenchymal hematoma with unchanged regional mass effect and partial effacement of fourth ventricle but no upstream hydrocephalus. There is no discernible enhancement to suggest underlying mass lesion, though acute blood products could mask enhancement.   06/12/2021 a chest x-ray showed a 4.4 cm left middle lobe. 06/13/2021, CT chest with contrast showed a 4.3 x 3.6 x 3.3 cm lobular spiculated mass in the posterior left upper lobe with T3 to the lateral pleura and major fissure.  Metastatic left hilar lymphadenopathy.  Peripheral micronodularity posterior right costophrenic sulcus.  Aortic atherosclerosis. 06/14/2021, CT abdomen pelvis showed right lower lobe pulmonary artery embolus.  No evidence of right heart strain.  No acute intra-abdominal or pelvic pathology.  Aortic atherosclerosis.  06/13/2021, patient underwent bronchoscopy with EBUS by Dr. Katrinka Blazing.  Biopsy from the fine-needle aspiration  of station 11 mL showed malignant cells, consistent with poorly differentiated non-small cell carcinoma,  consistent with adenocarcinoma.  Malignant cells are TTF-1 positive and negative for p40.  Negative for neuroendocrine markers.  Blood test and tissue sample were tested for Gardant 360  -PD-L1 TPS 97%, no actionable mutation on the blood testing. 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   07/17/2021 Cancer Staging   Staging form: Lung, AJCC 8th Edition - Clinical: Stage IV (cT2b, cN1, cM1) - Signed by [name], MD on 08/06/2021   08/06/2021 Imaging   I saw patient's PET scan after his visit with me on 08/06/21. PET scan was ordered by his previous oncologist group and did not come to my in basket.. Patient received cycle 1 carboplatin Taxol today.  He has started on radiation.  5/26/3 PET scan showed 3.8 cm hypermetabolic left upper lobe mass with hypermetabolic left hilar and infrahilar adenopathy.  There is approximately 8 scattered metastatic lesions in the skeleton. Mixed density photopenic lesion in the left cerebellum. characterized as hemorrhage on recent prior imaging workups.    08/16/2021 -  Chemotherapy   Patient is on Treatment Plan : LUNG Carboplatin (5) + Pemetrexed + Pembrolizumab (200) D1 q21d Induction x 4 cycles / Maintenance Pemetrexed + Pembrolizumab (200) D1 q21d      08/22/2021 Imaging   MRI brain w wo contrast  Decrease in size of left cerebellar hematoma with resolution of edema. No evidence of underlying lesion. Smaller, more recent hemorrhage in the left cerebellar vermis with minimal edema. There is minimal enhancement without definite evidence of underlying lesion.  New punctate focus of chronic blood products and enhancement in the left frontal lobe. Additional new foci of chronic blood products in the posterior right putamen, right parietal subcortical white matter, and posterior right cerebellum. Unclear at this time if these represent foci of bland hemorrhage or early metastases.   Increase in size of right parietal osseous metastasis with minor  extraosseous extension.     Imaging   PET scan showed 1. Interval response to therapy as evidenced by a small residual left upper lobe nodule with decreased hypermetabolism, no residual hypermetabolic adenopathy and decreased hypermetabolism associatedwith osseous metastases. 2. 6 mm posterior left upper lobe nodule, likely stable. Recommend attention on follow-up. 3. Aortic atherosclerosis (ICD10-I70.0). Coronary artery calcification.   12/17/2021 Imaging   MRI lumbar spine w wo contrast  1. No evidence of regional metastatic disease. 2. Late subacute superior endplate fracture at L2 and large superior endplate Schmorl's node at L5, unchanged since the PET scan of 10/29/2021. 3. L3-4: Shallow disc protrusion. Facet and ligamentous hypertrophy. Stenosis of both lateral recesses. Findings slightly worsened since 2022. 4. L4-5: Shallow disc protrusion. Facet and ligamentous hypertrophy. Small synovial cyst arising from the facet joint on the right. Stenosis of the lateral recesses right worse than left. Foraminal narrowing right worse than left. Findings have worsened since 2022.  5. L5-S1: Endplate osteophytes and shallow protrusion of the disc. Facet and ligamentous hypertrophy. Stenosis of the subarticular lateral recesses and neural foramina, right worse than left. Similar appearance to the study of 2022. 6. Continued evidence of enteritis of at least 1 loop of small bowel. Distended bladder present   12/18/2021 Imaging   Brian MRI w wo  1. Interval decrease in size of the left cerebellar and vermian hematomas. No definite evidence of underlying metastatic lesion. 2. There is are two possible contrast enhancing lesions in the posterior left frontal lobe and left occipital lobe,  which do not have intrinsic T1 signal abnormality, but have a somewhat linear appearance and may be vascular in nature. Recommend attention on follow up. 3. Multifocal sites of susceptibility artifact are  redemonstrated with interval development of a few new foci, as described above. All of these sites demonstrate intrinsic T1 hyperintense signal abnormality and susceptibility artifact and are compatible with sites of microhemorrhages. Recommend continued attention on follow up. 4. Interval decrease in size of right parietal calvarial metastatic lesion. No new contrast enhancing lesions visualized. 5. Diffusely heterogeneous marrow signal throughout the cervical spine, which is nonspecific but can be seen in the setting of anemia, smoking, obesity, or a marrow replacement process.     12/18/2021 Imaging   MRI lumbar spine w wo contrast  1. No evidence of regional metastatic disease. 2. Late subacute superior endplate fracture at L2 and large superior endplate Schmorl's node at L5, unchanged since the PET scan of 10/29/2021 3. L3-4: Shallow disc protrusion. Facet and ligamentous hypertrophy.Stenosis of both lateral recesses. Findings slightly worsened since 2022. 4. L4-5: Shallow disc protrusion. Facet and ligamentous hypertrophy. Small synovial cyst arising from the facet joint on the right. Stenosis of the lateral recesses right worse than left. Foraminal narrowing right worse than left. Findings have worsened since 2022. 5. L5-S1: Endplate osteophytes and shallow protrusion of the disc. Facet and ligamentous hypertrophy. Stenosis of the subarticular lateral recesses and neural foramina, right worse than left. Similar appearance to the study of 2022. 6. Continued evidence of enteritis of at least 1 loop of small bowel. Distended bladder present.       01/28/2022 Imaging   CT chest abdomen pelvis w contrast 1. Treated left upper lobe mass appears grossly stable to the prior examination when measured in a similar fashion on the prior study. No definite signs of extra skeletal metastatic disease noted in the chest, abdomen or pelvis. 2. Widespread skeletal metastases redemonstrated, as  above. 3. Hepatic steatosis. 4. Aortic atherosclerosis, in addition to left main and three-vessel coronary artery disease. Assessment for potential risk factor modification, dietary therapy or pharmacologic therapy may be warranted, if clinically indicated. 5. Additional incidental findings,    04/08/2022 Imaging   MRI lumbar spine w wo contrast  1. Stable remote superior endplate fracture of L2 and large Schmorl to noted involving L5. No worrisome bone lesions to suggest metastatic disease. 2. Degenerative lumbar spondylosis with multilevel disc disease and facet disease which appears relatively stable as detailed above. 3. Enlarging right-sided synovial cyst at L4-5 with progressive mass effect on the right side of the thecal sac. This could potentially be symptomatic   04/28/2022 Imaging   CT chest abdomen pelvis wo contrast  1. Similar versus slightly decreased size of treated left upper lobe primary bronchogenic carcinoma. 2. Similar osseous metastasis. 3. No evidence of extraosseous metastatic disease. 4. New small right pleural effusion. 5. Coronary artery atherosclerosis. Aortic Atherosclerosis   07/03/2022 Imaging   PET scan showed 1. Interval development of a small focus of intense hypermetabolism within the left upper lobe scar. This is associated with a 5 mm perifissural nodule in the left lung which is hypermetabolic. Hypermetabolism at both of these locations is new since prior PET-CT and highly suspicious for recurrent disease. 2. No evidence for hypermetabolic soft tissue metastatic disease in the neck, abdomen, or pelvis. 3. Similar appearance of sclerotic and lytic bone lesions without hypermetabolism on PET imaging. 4. Focal hypermetabolism associated with a fracture of the right L4 transverse process. The fracture was  visible on the previous CT of 04/28/2022. No associated soft tissue lesion to suggest that this is pathologic in nature. Tracer accumulation on today's study is  in keeping with healing fracture. There is also some minimal uptake in a prominent spur associated with the L4 superior endplate, most likely degenerative. 5.  Aortic Atherosclerosis    08/01/2022 - 08/14/2022 Radiation Therapy   Radiation to left lung.    10/01/2022 Imaging   chest angiogram PE protocol  No pulmonary embolism identified.   Stable nodular opacity in the left upper lobe. Please correlate for history of treated lung cancer. There is an adjacent small nodule along the course of the interlobar fissure which is slightly larger today compared to the study of February 2024. please correlate with findings of the recent PET-CT of 07/03/2022.      10/02/2022 Imaging   MRI brain with and without contrast 1. No acute intracranial abnormality. No evidence for intraparenchymal metastatic disease. 2. Chronic left cerebellar hemorrhage with multiple additional chronic micro hemorrhages elsewhere throughout the brain, stable. 3. Underlying mild chronic microvascular ischemic disease.    10/02/2022 Imaging   MRI lumbar spine showed 1. No MRI evidence for acute infection or other abnormality within the lumbar spine. 2. Few subcentimeter T1 hypointense lesions about the visualized posterior iliac wings. These correspond with small sclerotic foci seen on most recent CT from 04/08/2022, presumably reflecting patient's known osseous metastatic disease. These were not hypermetabolic on prior PET-CT from 16/12/9602. 3. No other evidence for metastatic disease within the lumbar spine itself. 4. Chronic L2 compression fracture, stable. Large Schmorl's node deformity with associated height loss at L5, also stable. 5. Multifactorial degenerative changes at L3-4 through L5-S1 with resultant mild to moderate bilateral subarticular stenosis, with mild to moderate bilateral L3 through L5 foraminal narrowing as above.   # Patient has a history of melanoma on his neck, treated in 2009. # History of  hemorrhagic infarct left cerebellum  10/01/2022 - 10/06/2022 patient was admitted to the hospital due to lower extremity weakness and intermittent aphasia. Patient reports acute on chronic left lower extremity weakness, weakness has been more prominent over the past 2 weeks.  He reports significant episode of difficulty with ambulation 2 days after his last Keytruda treatments.  He reports that he felt loss of control of muscle at that time.  Patient reports that the weakness is different than her chronic lower extremity  weakness.  Patient was found to have orthostatic hypotension Flomax irbesartan were held.  CK wnl, Neurology was consulted and started the patient on Keppra for possible seizure.  EEG was negative.   INTERVAL HISTORY Micheal Donovan is a 72 y.o. male who has above history reviewed by me today presents for follow up visit for management of  Stage IV lung adenocarcianoma + back pain, and left lower extremity weakness due to pain, on fentanyl patch Q72 hours and percocet, he sees orthopedic surgeon for epidural injections, Accompanied by wife.  Patient reports that lower extremity weakness has not changed after holding Keytruda.  He is convinced that lower extremities weakness likely not secondary to his cancer treatments and he wants to resume treatments. He has follow-up appointment with Dr. Barbaraann Cao in October 2024.    Review of Systems  Constitutional:  Positive for appetite change, fatigue and unexpected weight change. Negative for chills and fever.  HENT:   Negative for hearing loss and voice change.   Eyes:  Negative for eye problems and icterus.  Respiratory:  Positive for shortness of breath. Negative for chest tightness and cough.   Cardiovascular:  Positive for leg swelling. Negative for chest pain.  Gastrointestinal:  Negative for abdominal distention, abdominal pain and blood in stool.  Endocrine: Negative for hot flashes.  Genitourinary:  Negative for difficulty  urinating, dysuria and frequency.   Musculoskeletal:  Positive for back pain. Negative for arthralgias.  Skin:  Negative for itching and rash.  Neurological:  Negative for extremity weakness, headaches, light-headedness and numbness.  Hematological:  Negative for adenopathy. Does not bruise/bleed easily.  Psychiatric/Behavioral:  Positive for sleep disturbance. Negative for confusion.     MEDICAL HISTORY:  Past Medical History:  Diagnosis Date   Allergy    BPH (benign prostatic hypertrophy)    Cancer (HCC)    Melanoma on Neck    2008   Carotid artery occlusion    CHF (congestive heart failure) (HCC)    Diabetes mellitus    type 2   ED (erectile dysfunction)    GERD (gastroesophageal reflux disease)    Hemorrhagic stroke (HCC) 06/2021   Hyperlipidemia    Hypertension    Lung cancer (HCC)    Neoplasm related pain    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)     SURGICAL HISTORY: Past Surgical History:  Procedure Laterality Date   BRONCHIAL NEEDLE ASPIRATION BIOPSY  06/17/2021   Procedure: BRONCHIAL NEEDLE ASPIRATION BIOPSIES;  Surgeon: Lorin Glass, MD;  Location: [facility] ENDOSCOPY;  Service: Pulmonary;;   IR IMAGING GUIDED PORT INSERTION  08/05/2021   MELANOMA EXCISION  2008   Left side of neck   RADIOLOGY WITH ANESTHESIA N/A 04/05/2020   Procedure: MRI SPINE WITOUT CONTRAST;  Surgeon: Radiologist, Medication, MD;  Location: [facility] OR;  Service: Radiology;  Laterality: N/A;   RADIOLOGY WITH ANESTHESIA N/A 08/22/2021   Procedure: MRI BRAIN WITH AND WITHOUT CONTRAST  WITH ANESTHESIA;  Surgeon: Radiologist, Medication, MD;  Location: [facility] OR;  Service: Radiology;  Laterality: N/A;   RADIOLOGY WITH ANESTHESIA N/A 12/17/2021   Procedure: MRI BRAIN WITH AND WITHOUT CONTRAST WITH ANESTHESIA; MRI LUMBER WITH AND WITHOUT CONTRAST;  Surgeon: Radiologist, Medication, MD;  Location: [facility] OR;  Service: Radiology;  Laterality: N/A;   RADIOLOGY WITH ANESTHESIA N/A 04/08/2022   Procedure: MRI LUMBER SPINE WITH  AND WITHOUT CONTRAST;  Surgeon: Radiologist, Medication, MD;  Location: [facility] OR;  Service: Radiology;  Laterality: N/A;   TONSILLECTOMY     VIDEO BRONCHOSCOPY WITH ENDOBRONCHIAL ULTRASOUND N/A 06/17/2021   Procedure: VIDEO BRONCHOSCOPY WITH ENDOBRONCHIAL ULTRASOUND;  Surgeon: Lorin Glass, MD;  Location: [facility] ENDOSCOPY;  Service: Pulmonary;  Laterality: N/A;    SOCIAL HISTORY: Social History   Socioeconomic History   Marital status: Married    Spouse name: Not on file   Number of children: Not on file   Years of education: Not on file   Highest education level: Not on file  Occupational History   Not on file  Tobacco Use   Smoking status: Former    Current packs/day: 0.00    Types: Cigarettes    Quit date: 07/21/1990    Years since quitting: 32.3   Smokeless tobacco: Never  Vaping Use   Vaping status: Never Used  Substance and Sexual Activity   Alcohol use: No   Drug use: No   Sexual activity: Not Currently  Other Topics Concern   Not on file  Social History Narrative   Not on file   Social Determinants of Health   Financial Resource Strain: Low Risk  (  04/17/2022)   Overall Financial Resource Strain (CARDIA)    Difficulty of Paying Living Expenses: Not hard at all  Food Insecurity: No Food Insecurity (10/01/2022)   Hunger Vital Sign    Worried About Running Out of Food in the Last Year: Never true    Ran Out of Food in the Last Year: Never true  Transportation Needs: No Transportation Needs (10/01/2022)   PRAPARE - Administrator, Civil Service (Medical): No    Lack of Transportation (Non-Medical): No  Physical Activity: Inactive (03/27/2022)   Exercise Vital Sign    Days of Exercise per Week: 0 days    Minutes of Exercise per Session: 0 min  Stress: Stress Concern Present (03/27/2022)   [employer] of Occupational Health - Occupational Stress Questionnaire    Feeling of Stress : To some extent  Social Connections: Moderately Isolated (03/27/2022)    Social Connection and Isolation Panel [NHANES]    Frequency of Communication with Friends and Family: More than three times a week    Frequency of Social Gatherings with Friends and Family: Three times a week    Attends Religious Services: Never    Active Member of Clubs or Organizations: No    Attends Banker Meetings: Never    Marital Status: Married  Catering manager Violence: Not At Risk (10/01/2022)   Humiliation, Afraid, Rape, and Kick questionnaire    Fear of Current or Ex-Partner: No    Emotionally Abused: No    Physically Abused: No    Sexually Abused: No    FAMILY HISTORY: Family History  Problem Relation Age of Onset   COPD Mother    Heart disease Father    Heart disease Brother        MI at age 57   Stroke Neg Hx     ALLERGIES:  is allergic to niaspan [niacin].  MEDICATIONS:  Current Outpatient Medications  Medication Sig Dispense Refill   ALPRAZolam (XANAX) 0.5 MG tablet Take 1 tablet (0.5 mg total) by mouth at bedtime as needed for anxiety. 15 tablet 0   aspirin EC 81 MG tablet Take 1 tablet (81 mg total) by mouth daily. Swallow whole. 30 tablet 12   citalopram (CELEXA) 10 MG tablet Take 1 tablet (10 mg total) by mouth daily. 30 tablet 3   docusate sodium (COLACE) 100 MG capsule Take 100 mg by mouth 2 (two) times daily as needed for mild constipation.     fentaNYL (DURAGESIC) 75 MCG/HR Place 1 patch onto the skin every 3 (three) days. 10 patch 0   finasteride (PROSCAR) 5 MG tablet Take 1 tablet (5 mg total) by mouth daily. 30 tablet 0   gabapentin (NEURONTIN) 300 MG capsule TAKE 1 CAPSULE(300 MG) BY MOUTH THREE TIMES DAILY 90 capsule 1   levETIRAcetam (KEPPRA) 500 MG tablet Take 1 tablet (500 mg total) by mouth 2 (two) times daily. 60 tablet 0   megestrol (MEGACE) 40 MG tablet Take 1 tablet (40 mg total) by mouth daily. 30 tablet 3   midodrine (PROAMATINE) 10 MG tablet Take 1 tablet (10 mg total) by mouth 3 (three) times daily with meals. 90 tablet 0    Multiple Vitamin (MULTIVITAMIN WITH MINERALS) TABS tablet Take 1 tablet by mouth daily.     NOVOLOG FLEXPEN 100 UNIT/ML FlexPen Inject 0-10 Units into the skin daily as needed for high blood sugar (BG > 200).     Omeprazole 20 MG TBEC Take 20 mg by mouth daily.  oxyCODONE-acetaminophen (PERCOCET) 7.5-325 MG tablet Take 1 tablet by mouth every 4 (four) hours as needed for severe pain. Do not take with cough medication or other pain medication 60 tablet 0   traZODone (DESYREL) 50 MG tablet Take 1 tablet (50 mg total) by mouth at bedtime as needed for sleep. 30 tablet 3   No current facility-administered medications for this visit.   Facility-Administered Medications Ordered in Other Visits  Medication Dose Route Frequency Provider Last Rate Last Admin   heparin lock flush 100 UNIT/ML injection            sodium chloride flush (NS) 0.9 % injection 10 mL  10 mL Intracatheter PRN [name], MD   10 mL at 12/01/22 1559     PHYSICAL EXAMINATION:  Vitals:   12/01/22 1337  BP: 136/68  Pulse: 73  Resp: 18  Temp: (!) 96.3 F (35.7 C)  SpO2: 100%   Filed Weights   12/01/22 1337  Weight: 123 lb 4.8 oz (55.9 kg)    Physical Exam Constitutional:      General: He is not in acute distress.    Appearance: He is ill-appearing.  HENT:     Head: Normocephalic and atraumatic.  Eyes:     General: No scleral icterus. Cardiovascular:     Rate and Rhythm: Normal rate and regular rhythm.     Heart sounds: Normal heart sounds.  Pulmonary:     Effort: Pulmonary effort is normal. No respiratory distress.     Breath sounds: No wheezing.     Comments: Decreased breath sound bilaterally. Abdominal:     General: Bowel sounds are normal. There is no distension.     Palpations: Abdomen is soft.  Musculoskeletal:        General: No deformity. Normal range of motion.     Cervical back: Normal range of motion and neck supple.     Comments: Bilateral ankle trace edema.  Skin:    General: Skin is  warm and dry.     Findings: No rash.  Neurological:     Mental Status: He is alert. Mental status is at baseline.     Cranial Nerves: No cranial nerve deficit.     Comments: Chronic left-sided weakness  Psychiatric:        Mood and Affect: Mood normal.     LABORATORY DATA:  I have reviewed the data as listed    Latest Ref Rng & Units 12/01/2022    1:02 PM 10/20/2022    1:55 PM 10/09/2022    3:00 PM  CBC  WBC 4.0 - 10.5 K/uL 5.9  6.3  6.2   Hemoglobin 13.0 - 17.0 g/dL 16.1  09.6  04.5   Hematocrit 39.0 - 52.0 % 33.8  34.2  33.8   Platelets 150 - 400 K/uL 261  228  364       Latest Ref Rng & Units 12/01/2022    1:02 PM 10/20/2022    1:55 PM 10/09/2022    3:00 PM  CMP  Glucose 70 - 99 mg/dL 409  811  914   BUN 8 - 23 mg/dL 21  20  21    Creatinine 0.61 - 1.24 mg/dL 7.82  9.56  2.13   Sodium 135 - 145 mmol/L 134  133  136   Potassium 3.5 - 5.1 mmol/L 3.5  3.9  4.6   Chloride 98 - 111 mmol/L 104  104  103   CO2 22 - 32 mmol/L 23  22  24   Calcium 8.9 - 10.3 mg/dL 8.8  8.7  9.6   Total Protein 6.5 - 8.1 g/dL 6.9  7.1  7.0   Total Bilirubin 0.3 - 1.2 mg/dL 0.5  0.5  0.3   Alkaline Phos 38 - 126 U/L 56  69    AST 15 - 41 U/L 14  18  14    ALT 0 - 44 U/L 10  13  7       Iron/TIBC/Ferritin/ %Sat    Component Value Date/Time   IRON 60 02/03/2022 0958   TIBC 391 02/03/2022 0958   FERRITIN 704 (H) 02/03/2022 0958   IRONPCTSAT 15 (L) 02/03/2022 6213      RADIOGRAPHIC STUDIES: I have personally reviewed the radiological images as listed and agreed with the findings in the report. No results found.

## 2022-12-01 NOTE — Assessment & Plan Note (Signed)
Off Eliquis 2.5mg  BID due to concern of microbleeds in brain

## 2022-12-05 DIAGNOSIS — Z79899 Other long term (current) drug therapy: Secondary | ICD-10-CM | POA: Diagnosis not present

## 2022-12-05 DIAGNOSIS — Z5181 Encounter for therapeutic drug level monitoring: Secondary | ICD-10-CM | POA: Diagnosis not present

## 2022-12-05 DIAGNOSIS — C7951 Secondary malignant neoplasm of bone: Secondary | ICD-10-CM | POA: Diagnosis not present

## 2022-12-05 DIAGNOSIS — M5417 Radiculopathy, lumbosacral region: Secondary | ICD-10-CM | POA: Diagnosis not present

## 2022-12-05 DIAGNOSIS — M5136 Other intervertebral disc degeneration, lumbar region with discogenic back pain only: Secondary | ICD-10-CM | POA: Diagnosis not present

## 2022-12-05 DIAGNOSIS — M48061 Spinal stenosis, lumbar region without neurogenic claudication: Secondary | ICD-10-CM | POA: Diagnosis not present

## 2022-12-05 DIAGNOSIS — E1142 Type 2 diabetes mellitus with diabetic polyneuropathy: Secondary | ICD-10-CM | POA: Diagnosis not present

## 2022-12-05 DIAGNOSIS — C3412 Malignant neoplasm of upper lobe, left bronchus or lung: Secondary | ICD-10-CM | POA: Diagnosis not present

## 2022-12-11 ENCOUNTER — Inpatient Hospital Stay: Payer: PPO | Attending: Radiation Oncology

## 2022-12-11 DIAGNOSIS — M47817 Spondylosis without myelopathy or radiculopathy, lumbosacral region: Secondary | ICD-10-CM | POA: Insufficient documentation

## 2022-12-11 DIAGNOSIS — Z8673 Personal history of transient ischemic attack (TIA), and cerebral infarction without residual deficits: Secondary | ICD-10-CM | POA: Insufficient documentation

## 2022-12-11 DIAGNOSIS — I110 Hypertensive heart disease with heart failure: Secondary | ICD-10-CM | POA: Insufficient documentation

## 2022-12-11 DIAGNOSIS — E785 Hyperlipidemia, unspecified: Secondary | ICD-10-CM | POA: Insufficient documentation

## 2022-12-11 DIAGNOSIS — Z79899 Other long term (current) drug therapy: Secondary | ICD-10-CM | POA: Insufficient documentation

## 2022-12-11 DIAGNOSIS — Z794 Long term (current) use of insulin: Secondary | ICD-10-CM | POA: Insufficient documentation

## 2022-12-11 DIAGNOSIS — M7138 Other bursal cyst, other site: Secondary | ICD-10-CM | POA: Insufficient documentation

## 2022-12-11 DIAGNOSIS — I2699 Other pulmonary embolism without acute cor pulmonale: Secondary | ICD-10-CM | POA: Insufficient documentation

## 2022-12-11 DIAGNOSIS — C7951 Secondary malignant neoplasm of bone: Secondary | ICD-10-CM | POA: Insufficient documentation

## 2022-12-11 DIAGNOSIS — Z7982 Long term (current) use of aspirin: Secondary | ICD-10-CM | POA: Insufficient documentation

## 2022-12-11 DIAGNOSIS — Z5112 Encounter for antineoplastic immunotherapy: Secondary | ICD-10-CM | POA: Insufficient documentation

## 2022-12-11 DIAGNOSIS — G629 Polyneuropathy, unspecified: Secondary | ICD-10-CM | POA: Insufficient documentation

## 2022-12-11 DIAGNOSIS — I509 Heart failure, unspecified: Secondary | ICD-10-CM | POA: Insufficient documentation

## 2022-12-11 DIAGNOSIS — G893 Neoplasm related pain (acute) (chronic): Secondary | ICD-10-CM | POA: Insufficient documentation

## 2022-12-11 DIAGNOSIS — Z923 Personal history of irradiation: Secondary | ICD-10-CM | POA: Insufficient documentation

## 2022-12-11 DIAGNOSIS — I700 Atherosclerosis of aorta: Secondary | ICD-10-CM | POA: Insufficient documentation

## 2022-12-11 DIAGNOSIS — Z87891 Personal history of nicotine dependence: Secondary | ICD-10-CM | POA: Insufficient documentation

## 2022-12-11 DIAGNOSIS — E1140 Type 2 diabetes mellitus with diabetic neuropathy, unspecified: Secondary | ICD-10-CM | POA: Insufficient documentation

## 2022-12-11 DIAGNOSIS — K219 Gastro-esophageal reflux disease without esophagitis: Secondary | ICD-10-CM | POA: Insufficient documentation

## 2022-12-11 DIAGNOSIS — R531 Weakness: Secondary | ICD-10-CM | POA: Insufficient documentation

## 2022-12-11 DIAGNOSIS — N400 Enlarged prostate without lower urinary tract symptoms: Secondary | ICD-10-CM | POA: Insufficient documentation

## 2022-12-11 DIAGNOSIS — C3412 Malignant neoplasm of upper lobe, left bronchus or lung: Secondary | ICD-10-CM | POA: Insufficient documentation

## 2022-12-11 DIAGNOSIS — M48061 Spinal stenosis, lumbar region without neurogenic claudication: Secondary | ICD-10-CM | POA: Insufficient documentation

## 2022-12-11 DIAGNOSIS — C7931 Secondary malignant neoplasm of brain: Secondary | ICD-10-CM | POA: Insufficient documentation

## 2022-12-11 DIAGNOSIS — E11319 Type 2 diabetes mellitus with unspecified diabetic retinopathy without macular edema: Secondary | ICD-10-CM | POA: Insufficient documentation

## 2022-12-11 NOTE — Progress Notes (Signed)
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  Patient on [place] and zometa.    Spoke with patient via phone.  Reports that his appetite is pretty good.  Eating ice cream every night for a bedtime snack (likes strawberry).  Had a pack of nabs for breakfast this am and can't remember what he ate for supper last night.  Denies any nutrition impact symptoms at this time.      Medications: megace  Labs: reviewed  Anthropometrics:   Weight 123 lb on 9/30 120 lb 14.4 oz on 8/19 132 lb on 7/31 (hospital wt) 133 lb on 7/18 150 lb on 09/27/21  NUTRITION DIAGNOSIS: Inadequate oral intake improving    INTERVENTION:  Continue high calorie, high protein foods to prevent weight loss    MONITORING, EVALUATION, GOAL: weight trends, intake   NEXT VISIT: as needed  Melannie Metzner B. Freida Busman, RD, LDN Registered Dietitian [phone]

## 2022-12-12 ENCOUNTER — Inpatient Hospital Stay: Payer: PPO | Admitting: Internal Medicine

## 2022-12-12 ENCOUNTER — Other Ambulatory Visit: Payer: Self-pay

## 2022-12-12 VITALS — BP 121/69 | HR 72 | Temp 98.3°F | Resp 18 | Ht 67.0 in | Wt 123.0 lb

## 2022-12-12 DIAGNOSIS — G893 Neoplasm related pain (acute) (chronic): Secondary | ICD-10-CM | POA: Diagnosis not present

## 2022-12-12 DIAGNOSIS — Z7982 Long term (current) use of aspirin: Secondary | ICD-10-CM | POA: Diagnosis not present

## 2022-12-12 DIAGNOSIS — I2699 Other pulmonary embolism without acute cor pulmonale: Secondary | ICD-10-CM | POA: Diagnosis not present

## 2022-12-12 DIAGNOSIS — M7138 Other bursal cyst, other site: Secondary | ICD-10-CM | POA: Diagnosis not present

## 2022-12-12 DIAGNOSIS — I619 Nontraumatic intracerebral hemorrhage, unspecified: Secondary | ICD-10-CM | POA: Diagnosis not present

## 2022-12-12 DIAGNOSIS — Z79899 Other long term (current) drug therapy: Secondary | ICD-10-CM | POA: Diagnosis not present

## 2022-12-12 DIAGNOSIS — C7951 Secondary malignant neoplasm of bone: Secondary | ICD-10-CM | POA: Diagnosis not present

## 2022-12-12 DIAGNOSIS — M47817 Spondylosis without myelopathy or radiculopathy, lumbosacral region: Secondary | ICD-10-CM | POA: Diagnosis not present

## 2022-12-12 DIAGNOSIS — N400 Enlarged prostate without lower urinary tract symptoms: Secondary | ICD-10-CM | POA: Diagnosis not present

## 2022-12-12 DIAGNOSIS — Z87891 Personal history of nicotine dependence: Secondary | ICD-10-CM | POA: Diagnosis not present

## 2022-12-12 DIAGNOSIS — C3412 Malignant neoplasm of upper lobe, left bronchus or lung: Secondary | ICD-10-CM | POA: Diagnosis not present

## 2022-12-12 DIAGNOSIS — I110 Hypertensive heart disease with heart failure: Secondary | ICD-10-CM | POA: Diagnosis not present

## 2022-12-12 DIAGNOSIS — E785 Hyperlipidemia, unspecified: Secondary | ICD-10-CM | POA: Diagnosis not present

## 2022-12-12 DIAGNOSIS — M48061 Spinal stenosis, lumbar region without neurogenic claudication: Secondary | ICD-10-CM | POA: Diagnosis not present

## 2022-12-12 DIAGNOSIS — Z5112 Encounter for antineoplastic immunotherapy: Secondary | ICD-10-CM | POA: Diagnosis not present

## 2022-12-12 DIAGNOSIS — M47816 Spondylosis without myelopathy or radiculopathy, lumbar region: Secondary | ICD-10-CM

## 2022-12-12 DIAGNOSIS — E11319 Type 2 diabetes mellitus with unspecified diabetic retinopathy without macular edema: Secondary | ICD-10-CM | POA: Diagnosis not present

## 2022-12-12 DIAGNOSIS — G629 Polyneuropathy, unspecified: Secondary | ICD-10-CM | POA: Diagnosis not present

## 2022-12-12 DIAGNOSIS — C7931 Secondary malignant neoplasm of brain: Secondary | ICD-10-CM | POA: Diagnosis not present

## 2022-12-12 DIAGNOSIS — E1140 Type 2 diabetes mellitus with diabetic neuropathy, unspecified: Secondary | ICD-10-CM | POA: Diagnosis not present

## 2022-12-12 DIAGNOSIS — Z8673 Personal history of transient ischemic attack (TIA), and cerebral infarction without residual deficits: Secondary | ICD-10-CM | POA: Diagnosis not present

## 2022-12-12 DIAGNOSIS — Z923 Personal history of irradiation: Secondary | ICD-10-CM | POA: Diagnosis not present

## 2022-12-12 DIAGNOSIS — I700 Atherosclerosis of aorta: Secondary | ICD-10-CM | POA: Diagnosis not present

## 2022-12-12 DIAGNOSIS — I509 Heart failure, unspecified: Secondary | ICD-10-CM | POA: Diagnosis not present

## 2022-12-12 DIAGNOSIS — K219 Gastro-esophageal reflux disease without esophagitis: Secondary | ICD-10-CM | POA: Diagnosis not present

## 2022-12-12 DIAGNOSIS — R531 Weakness: Secondary | ICD-10-CM | POA: Diagnosis not present

## 2022-12-12 MED ORDER — LEVETIRACETAM 500 MG PO TABS: 500.0000 mg | ORAL_TABLET | Freq: Two times a day (BID) | ORAL | 5 refills | Status: DC

## 2022-12-12 MED ORDER — GABAPENTIN 300 MG PO CAPS: 600.0000 mg | ORAL_CAPSULE | Freq: Two times a day (BID) | ORAL | 2 refills | Status: DC

## 2022-12-12 NOTE — Progress Notes (Signed)
[facility] at [facility] [address]  [place], [place] [address] [phone]   Interval Evaluation  Date of Service: 12/12/22 Patient Name: Micheal Donovan Patient MRN: [id number] Patient DOB: Jul 23, 1950 Provider: Henreitta Leber, MD  Identifying Statement:  Micheal Donovan is a 72 y.o. male with Intraparenchymal hemorrhage of brain [facility])  Neuropathy   Primary Cancer:  Oncologic History: Oncology History Overview Note  Diagnosis: Stage IIB T2b N1 M0 adenocarcinoma of the LUL, poorly differentiated     Primary non-small cell carcinoma of upper lobe of left lung (HCC)  06/13/2021 Initial Diagnosis   Primary non-small cell carcinoma of upper lobe of left lung [facility]) -06/20/2021 - 06/27/2021, patient presented to [facility] due to progressive headache/dizziness/gait changes. CT head showed acute to subacute intraparenchymal hemorrhage involving the left cerebellum.  Surrounding low-density vasogenic edema.  Patient was transferred to [facility]. This was further evaluated by CT angiogram of the neck which showed bulky calcified plaques/stenosis of carotid artery, Followed by MRI brain. 06/12/2021, MRI of the brain showed no significant interval change in size of the left cerebellar intraparenchymal hematoma with unchanged regional mass effect and partial effacement of fourth ventricle but no upstream hydrocephalus. There is no discernible enhancement to suggest underlying mass lesion, though acute blood products could mask enhancement.   06/12/2021 a chest x-ray showed a 4.4 cm left middle lobe. 06/13/2021, CT chest with contrast showed a 4.3 x 3.6 x 3.3 cm lobular spiculated mass in the posterior left upper lobe with T3 to the lateral pleura and major fissure.  Metastatic left hilar lymphadenopathy.  Peripheral micronodularity posterior right costophrenic sulcus.  Aortic atherosclerosis. 06/14/2021, CT abdomen pelvis showed right lower lobe pulmonary artery  embolus.  No evidence of right heart strain.  No acute intra-abdominal or pelvic pathology.  Aortic atherosclerosis.  06/13/2021, patient underwent bronchoscopy with EBUS by Dr. Katrinka Blazing.  Biopsy from the fine-needle aspiration of station 11 mL showed malignant cells, consistent with poorly differentiated non-small cell carcinoma, consistent with adenocarcinoma.  Malignant cells are TTF-1 positive and negative for p40.  Negative for neuroendocrine markers.  Blood test and tissue sample were tested for Gardant 360  -PD-L1 TPS 97%, no actionable mutation on the blood testing. 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   07/17/2021 Cancer Staging   Staging form: Lung, AJCC 8th Edition - Clinical: Stage IV (cT2b, cN1, cM1) - Signed by [name], MD on 08/06/2021   08/06/2021 Imaging   I saw patient's PET scan after his visit with me on 08/06/21. PET scan was ordered by his previous oncologist group and did not come to my in basket.. Patient received cycle 1 carboplatin Taxol today.  He has started on radiation.  5/26/3 PET scan showed 3.8 cm hypermetabolic left upper lobe mass with hypermetabolic left hilar and infrahilar adenopathy.  There is approximately 8 scattered metastatic lesions in the skeleton. Mixed density photopenic lesion in the left cerebellum. characterized as hemorrhage on recent prior imaging workups.    08/16/2021 -  Chemotherapy   Patient is on Treatment Plan : LUNG Carboplatin (5) + Pemetrexed + Pembrolizumab (200) D1 q21d Induction x 4 cycles / Maintenance Pemetrexed + Pembrolizumab (200) D1 q21d      08/22/2021 Imaging   MRI brain w wo contrast  Decrease in size of left cerebellar hematoma with resolution of edema. No evidence of underlying lesion. Smaller, more recent hemorrhage in the left cerebellar vermis with minimal edema. There is minimal enhancement  without definite evidence of underlying lesion.  New punctate focus of chronic blood products and enhancement in the  left frontal lobe. Additional new foci of chronic blood products in the posterior right putamen, right parietal subcortical white matter, and posterior right cerebellum. Unclear at this time if these represent foci of bland hemorrhage or early metastases.   Increase in size of right parietal osseous metastasis with minor extraosseous extension.     Imaging   PET scan showed 1. Interval response to therapy as evidenced by a small residual left upper lobe nodule with decreased hypermetabolism, no residual hypermetabolic adenopathy and decreased hypermetabolism associatedwith osseous metastases. 2. 6 mm posterior left upper lobe nodule, likely stable. Recommend attention on follow-up. 3. Aortic atherosclerosis (ICD10-I70.0). Coronary artery calcification.   12/17/2021 Imaging   MRI lumbar spine w wo contrast  1. No evidence of regional metastatic disease. 2. Late subacute superior endplate fracture at L2 and large superior endplate Schmorl's node at L5, unchanged since the PET scan of 10/29/2021. 3. L3-4: Shallow disc protrusion. Facet and ligamentous hypertrophy. Stenosis of both lateral recesses. Findings slightly worsened since 2022. 4. L4-5: Shallow disc protrusion. Facet and ligamentous hypertrophy. Small synovial cyst arising from the facet joint on the right. Stenosis of the lateral recesses right worse than left. Foraminal narrowing right worse than left. Findings have worsened since 2022.  5. L5-S1: Endplate osteophytes and shallow protrusion of the disc. Facet and ligamentous hypertrophy. Stenosis of the subarticular lateral recesses and neural foramina, right worse than left. Similar appearance to the study of 2022. 6. Continued evidence of enteritis of at least 1 loop of small bowel. Distended bladder present   12/18/2021 Imaging   Brian MRI w wo  1. Interval decrease in size of the left cerebellar and vermian hematomas. No definite evidence of underlying metastatic lesion. 2. There  is are two possible contrast enhancing lesions in the posterior left frontal lobe and left occipital lobe, which do not have intrinsic T1 signal abnormality, but have a somewhat linear appearance and may be vascular in nature. Recommend attention on follow up. 3. Multifocal sites of susceptibility artifact are redemonstrated with interval development of a few new foci, as described above. All of these sites demonstrate intrinsic T1 hyperintense signal abnormality and susceptibility artifact and are compatible with sites of microhemorrhages. Recommend continued attention on follow up. 4. Interval decrease in size of right parietal calvarial metastatic lesion. No new contrast enhancing lesions visualized. 5. Diffusely heterogeneous marrow signal throughout the cervical spine, which is nonspecific but can be seen in the setting of anemia, smoking, obesity, or a marrow replacement process.     12/18/2021 Imaging   MRI lumbar spine w wo contrast  1. No evidence of regional metastatic disease. 2. Late subacute superior endplate fracture at L2 and large superior endplate Schmorl's node at L5, unchanged since the PET scan of 10/29/2021 3. L3-4: Shallow disc protrusion. Facet and ligamentous hypertrophy.Stenosis of both lateral recesses. Findings slightly worsened since 2022. 4. L4-5: Shallow disc protrusion. Facet and ligamentous hypertrophy. Small synovial cyst arising from the facet joint on the right. Stenosis of the lateral recesses right worse than left. Foraminal narrowing right worse than left. Findings have worsened since 2022. 5. L5-S1: Endplate osteophytes and shallow protrusion of the disc. Facet and ligamentous hypertrophy. Stenosis of the subarticular lateral recesses and neural foramina, right worse than left. Similar appearance to the study of 2022. 6. Continued evidence of enteritis of at least 1 loop of small bowel. Distended bladder  present.       01/28/2022 Imaging   CT chest  abdomen pelvis w contrast 1. Treated left upper lobe mass appears grossly stable to the prior examination when measured in a similar fashion on the prior study. No definite signs of extra skeletal metastatic disease noted in the chest, abdomen or pelvis. 2. Widespread skeletal metastases redemonstrated, as above. 3. Hepatic steatosis. 4. Aortic atherosclerosis, in addition to left main and three-vessel coronary artery disease. Assessment for potential risk factor modification, dietary therapy or pharmacologic therapy may be warranted, if clinically indicated. 5. Additional incidental findings,    04/08/2022 Imaging   MRI lumbar spine w wo contrast  1. Stable remote superior endplate fracture of L2 and large Schmorl to noted involving L5. No worrisome bone lesions to suggest metastatic disease. 2. Degenerative lumbar spondylosis with multilevel disc disease and facet disease which appears relatively stable as detailed above. 3. Enlarging right-sided synovial cyst at L4-5 with progressive mass effect on the right side of the thecal sac. This could potentially be symptomatic   04/28/2022 Imaging   CT chest abdomen pelvis wo contrast  1. Similar versus slightly decreased size of treated left upper lobe primary bronchogenic carcinoma. 2. Similar osseous metastasis. 3. No evidence of extraosseous metastatic disease. 4. New small right pleural effusion. 5. Coronary artery atherosclerosis. Aortic Atherosclerosis   07/03/2022 Imaging   PET scan showed 1. Interval development of a small focus of intense hypermetabolism within the left upper lobe scar. This is associated with a 5 mm perifissural nodule in the left lung which is hypermetabolic. Hypermetabolism at both of these locations is new since prior PET-CT and highly suspicious for recurrent disease. 2. No evidence for hypermetabolic soft tissue metastatic disease in the neck, abdomen, or pelvis. 3. Similar appearance of sclerotic and lytic bone  lesions without hypermetabolism on PET imaging. 4. Focal hypermetabolism associated with a fracture of the right L4 transverse process. The fracture was visible on the previous CT of 04/28/2022. No associated soft tissue lesion to suggest that this is pathologic in nature. Tracer accumulation on today's study is in keeping with healing fracture. There is also some minimal uptake in a prominent spur associated with the L4 superior endplate, most likely degenerative. 5.  Aortic Atherosclerosis    08/01/2022 - 08/14/2022 Radiation Therapy   Radiation to left lung.    10/01/2022 Imaging   chest angiogram PE protocol  No pulmonary embolism identified.   Stable nodular opacity in the left upper lobe. Please correlate for history of treated lung cancer. There is an adjacent small nodule along the course of the interlobar fissure which is slightly larger today compared to the study of February 2024. please correlate with findings of the recent PET-CT of 07/03/2022.      10/02/2022 Imaging   MRI brain with and without contrast 1. No acute intracranial abnormality. No evidence for intraparenchymal metastatic disease. 2. Chronic left cerebellar hemorrhage with multiple additional chronic micro hemorrhages elsewhere throughout the brain, stable. 3. Underlying mild chronic microvascular ischemic disease.    10/02/2022 Imaging   MRI lumbar spine showed 1. No MRI evidence for acute infection or other abnormality within the lumbar spine. 2. Few subcentimeter T1 hypointense lesions about the visualized posterior iliac wings. These correspond with small sclerotic foci seen on most recent CT from 04/08/2022, presumably reflecting patient's known osseous metastatic disease. These were not hypermetabolic on prior PET-CT from 56/21/3086. 3. No other evidence for metastatic disease within the lumbar spine itself. 4. Chronic L2  compression fracture, stable. Large Schmorl's node deformity with associated height  loss at L5, also stable. 5. Multifactorial degenerative changes at L3-4 through L5-S1 with resultant mild to moderate bilateral subarticular stenosis, with mild to moderate bilateral L3 through L5 foraminal narrowing as above.     Interval History: Micheal Donovan presents today for follow up after recent hospitalization.  He had presented with acute on chronic left side (mainly leg) weakness, accompanied by episodes of aphasa and staring.  Stroke workup was negative, he was treated for suspected seizures with Keppra and discharged with physical therapy.  Remains with imbalance and poor coordination of left leg as prior, worsened lately by pain in his hip and lower back.  Now unable to use a walker and is requiring wheelchair at home.  No seizure events since starting the Keppra.  Continues on Keytruda with Dr Cathie Hoops.  H+P (02/21/22) Patient presents for evaluation today for stroke history.  He presented initially in April 2023 with episode of sudden onset dizziness, inability to walk.  Following workup, he returned gradually to baseline functional/gait status after 2 months of intense rehab.  Lung cancer was also diagnosed in this time period, in addition to pulmonary embolism.  He was discharged on low dose Eliquis, but his has been discontinued by Dr. Cathie Hoops given ongoing CNS blood products.  Aside from recent issues with lower legs, swelling and rash, his gait and functional status have been independent.  He is not taking aspirin, statin, or blood pressure medications currently.  Medications: Current Outpatient Medications on File Prior to Visit  Medication Sig Dispense Refill   ALPRAZolam (XANAX) 0.5 MG tablet Take 1 tablet (0.5 mg total) by mouth at bedtime as needed for anxiety. 15 tablet 0   aspirin EC 81 MG tablet Take 1 tablet (81 mg total) by mouth daily. Swallow whole. 30 tablet 12   citalopram (CELEXA) 10 MG tablet Take 1 tablet (10 mg total) by mouth daily. 30 tablet 3   docusate sodium  (COLACE) 100 MG capsule Take 100 mg by mouth 2 (two) times daily as needed for mild constipation.     fentaNYL (DURAGESIC) 75 MCG/HR Place 1 patch onto the skin every 3 (three) days. 10 patch 0   finasteride (PROSCAR) 5 MG tablet Take 1 tablet (5 mg total) by mouth daily. 30 tablet 0   gabapentin (NEURONTIN) 300 MG capsule TAKE 1 CAPSULE(300 MG) BY MOUTH THREE TIMES DAILY 90 capsule 1   levETIRAcetam (KEPPRA) 500 MG tablet Take 1 tablet (500 mg total) by mouth 2 (two) times daily. 60 tablet 0   megestrol (MEGACE) 40 MG tablet Take 1 tablet (40 mg total) by mouth daily. 30 tablet 3   midodrine (PROAMATINE) 10 MG tablet Take 1 tablet (10 mg total) by mouth 3 (three) times daily with meals. 90 tablet 0   Multiple Vitamin (MULTIVITAMIN WITH MINERALS) TABS tablet Take 1 tablet by mouth daily.     NOVOLOG FLEXPEN 100 UNIT/ML FlexPen Inject 0-10 Units into the skin daily as needed for high blood sugar (BG > 200).     Omeprazole 20 MG TBEC Take 20 mg by mouth daily.     oxyCODONE-acetaminophen (PERCOCET) 7.5-325 MG tablet Take 1 tablet by mouth every 4 (four) hours as needed for severe pain. Do not take with cough medication or other pain medication 60 tablet 0   traZODone (DESYREL) 50 MG tablet Take 1 tablet (50 mg total) by mouth at bedtime as needed for sleep. 30 tablet 3   [  DISCONTINUED] prochlorperazine (COMPAZINE) 10 MG tablet Take 1 tablet (10 mg total) by mouth every 6 (six) hours as needed (Nausea or vomiting). 30 tablet 1   Current Facility-Administered Medications on File Prior to Visit  Medication Dose Route Frequency Provider Last Rate Last Admin   heparin lock flush 100 UNIT/ML injection             Allergies:  Allergies  Allergen Reactions   Niaspan [Niacin]     FLUSHING   Past Medical History:  Past Medical History:  Diagnosis Date   Allergy    BPH (benign prostatic hypertrophy)    Cancer (HCC)    Melanoma on Neck    2008   Carotid artery occlusion    CHF (congestive heart  failure) (HCC)    Diabetes mellitus    type 2   ED (erectile dysfunction)    GERD (gastroesophageal reflux disease)    Hemorrhagic stroke (HCC) 06/2021   Hyperlipidemia    Hypertension    Lung cancer (HCC)    Neoplasm related pain    Retinopathy due to secondary DM [facility])    Past Surgical History:  Past Surgical History:  Procedure Laterality Date   BRONCHIAL NEEDLE ASPIRATION BIOPSY  06/17/2021   Procedure: BRONCHIAL NEEDLE ASPIRATION BIOPSIES;  Surgeon: Lorin Glass, MD;  Location: [facility] ENDOSCOPY;  Service: Pulmonary;;   IR IMAGING GUIDED PORT INSERTION  08/05/2021   MELANOMA EXCISION  2008   Left side of neck   RADIOLOGY WITH ANESTHESIA N/A 04/05/2020   Procedure: MRI SPINE WITOUT CONTRAST;  Surgeon: Radiologist, Medication, MD;  Location: [facility] OR;  Service: Radiology;  Laterality: N/A;   RADIOLOGY WITH ANESTHESIA N/A 08/22/2021   Procedure: MRI BRAIN WITH AND WITHOUT CONTRAST  WITH ANESTHESIA;  Surgeon: Radiologist, Medication, MD;  Location: [facility] OR;  Service: Radiology;  Laterality: N/A;   RADIOLOGY WITH ANESTHESIA N/A 12/17/2021   Procedure: MRI BRAIN WITH AND WITHOUT CONTRAST WITH ANESTHESIA; MRI LUMBER WITH AND WITHOUT CONTRAST;  Surgeon: Radiologist, Medication, MD;  Location: [facility] OR;  Service: Radiology;  Laterality: N/A;   RADIOLOGY WITH ANESTHESIA N/A 04/08/2022   Procedure: MRI LUMBER SPINE WITH AND WITHOUT CONTRAST;  Surgeon: Radiologist, Medication, MD;  Location: [facility] OR;  Service: Radiology;  Laterality: N/A;   TONSILLECTOMY     VIDEO BRONCHOSCOPY WITH ENDOBRONCHIAL ULTRASOUND N/A 06/17/2021   Procedure: VIDEO BRONCHOSCOPY WITH ENDOBRONCHIAL ULTRASOUND;  Surgeon: Lorin Glass, MD;  Location: [facility] ENDOSCOPY;  Service: Pulmonary;  Laterality: N/A;   Social History:  Social History   Socioeconomic History   Marital status: Married    Spouse name: Not on file   Number of children: Not on file   Years of education: Not on file   Highest education level: Not on file   Occupational History   Not on file  Tobacco Use   Smoking status: Former    Current packs/day: 0.00    Types: Cigarettes    Quit date: 07/21/1990    Years since quitting: 32.4   Smokeless tobacco: Never  Vaping Use   Vaping status: Never Used  Substance and Sexual Activity   Alcohol use: No   Drug use: No   Sexual activity: Not Currently  Other Topics Concern   Not on file  Social History Narrative   Not on file   Social Determinants of Health   Financial Resource Strain: Low Risk  (04/17/2022)   Overall Financial Resource Strain (CARDIA)    Difficulty of Paying Living Expenses: Not hard at all  Food Insecurity: No Food Insecurity (10/01/2022)   Hunger Vital Sign    Worried About Running Out of Food in the Last Year: Never true    Ran Out of Food in the Last Year: Never true  Transportation Needs: No Transportation Needs (10/01/2022)   PRAPARE - Administrator, Civil Service (Medical): No    Lack of Transportation (Non-Medical): No  Physical Activity: Inactive (03/27/2022)   Exercise Vital Sign    Days of Exercise per Week: 0 days    Minutes of Exercise per Session: 0 min  Stress: Stress Concern Present (03/27/2022)   [employer] of Occupational Health - Occupational Stress Questionnaire    Feeling of Stress : To some extent  Social Connections: Unknown (11/17/2022)   Received from [facility]   Social Network    Social Network: Not on file  Intimate Partner Violence: Unknown (11/17/2022)   Received from [facility]   HITS    Physically Hurt: Not on file    Insult or Talk Down To: Not on file    Threaten Physical Harm: Not on file    Scream or Curse: Not on file   Family History:  Family History  Problem Relation Age of Onset   COPD Mother    Heart disease Father    Heart disease Brother        MI at age 76   Stroke Neg Hx     Review of Systems: Constitutional: Doesn't report fevers, chills or abnormal weight loss Eyes: Doesn't report  blurriness of vision Ears, nose, mouth, throat, and face: Doesn't report sore throat Respiratory: Doesn't report cough, dyspnea or wheezes Cardiovascular: Doesn't report palpitation, chest discomfort  Gastrointestinal:  Doesn't report nausea, constipation, diarrhea GU: Doesn't report incontinence Skin: Doesn't report skin rashes Neurological: Per HPI Musculoskeletal: Doesn't report joint pain Behavioral/Psych: Doesn't report anxiety  Physical Exam: Vitals:   12/12/22 1100  BP: 121/69  Pulse: 72  Resp: 18  Temp: 98.3 F (36.8 C)     KPS: 60. General: Alert, cooperative, pleasant, in no acute distress Head: Normal EENT: No conjunctival injection or scleral icterus.  Lungs: Resp effort normal Cardiac: Regular rate Abdomen: Non-distended abdomen Skin: Erythema lower legs Extremities: No clubbing or edema  Neurologic Exam: Mental Status: Awake, alert, attentive to examiner. Oriented to self and environment. Language is fluent with intact comprehension.  Cranial Nerves: Visual acuity is grossly normal. Visual fields are full. Extra-ocular movements intact. No ptosis. Face is symmetric Motor: Tone and bulk are normal. Power is 3/5 in left leg proximal and distal. Reflexes are symmetric, no pathologic reflexes present.  Sensory: Intact to light touch Gait: Non ambulatory   Labs: I have reviewed the data as listed    Component Value Date/Time   NA 134 (L) 12/01/2022 1302   K 3.5 12/01/2022 1302   CL 104 12/01/2022 1302   CO2 23 12/01/2022 1302   GLUCOSE 125 (H) 12/01/2022 1302   BUN 21 12/01/2022 1302   CREATININE 0.93 12/01/2022 1302   CREATININE 1.11 10/09/2022 1500   CALCIUM 8.8 (L) 12/01/2022 1302   PROT 6.9 12/01/2022 1302   ALBUMIN 3.3 (L) 12/01/2022 1302   AST 14 (L) 12/01/2022 1302   AST 18 07/17/2021 1345   ALT 10 12/01/2022 1302   ALT 20 07/17/2021 1345   ALKPHOS 56 12/01/2022 1302   BILITOT 0.5 12/01/2022 1302   BILITOT 0.6 07/17/2021 1345   GFRNONAA  >60 12/01/2022 1302   GFRNONAA >60 07/17/2021 1345  GFRNONAA 88 03/05/2016 0824   GFRAA >89 03/05/2016 0824   Lab Results  Component Value Date   WBC 5.9 12/01/2022   NEUTROABS 4.3 12/01/2022   HGB 11.1 (L) 12/01/2022   HCT 33.8 (L) 12/01/2022   MCV 91.8 12/01/2022   PLT 261 12/01/2022    Imaging: CLINICAL DATA:  Initial evaluation for lumbar radiculopathy. Infection suspected. History of lung cancer.   EXAM: MRI LUMBAR SPINE WITHOUT AND WITH CONTRAST   TECHNIQUE: Multiplanar and multiecho pulse sequences of the lumbar spine were obtained without and with intravenous contrast.   CONTRAST:  6mL GADAVIST GADOBUTROL 1 MMOL/ML IV SOLN   COMPARISON:  Prior study from 04/08/2022.   FINDINGS: Segmentation: Standard. Lowest well-formed disc space labeled the L5-S1 level.   Alignment: 4 mm retrolisthesis of L5 on S1. Straightening with mild reversal of the normal lumbar lordosis.   Vertebrae: Chronic compression deformity involving the superior endplate of L2, stable. Large endplate Schmorl's node deformity with associated height loss at the superior endplate of L5, also stable. Smaller endplate Schmorl's node deformity with mild height loss at the superior endplate of L3 noted as well, also stable. Vertebral body height otherwise maintained with no acute or interval fracture. Underlying bone marrow signal intensity within normal limits. A few small T1 hypointense lesions seen about the visualized posterior iliac wings bilaterally, largest of which seen on the left and measures 8 mm (series 12, image 33). These correspond with previously seen small sclerotic lesions, presumably reflecting patient's known osseous metastases. These were not hypermetabolic on prior PET-CT from 44/05/4740. Mild degenerative reactive endplate changes noted about the L3-4 and L5-S1 interspaces. No evidence for osteomyelitis discitis or septic arthritis.   Conus medullaris and cauda equina:  Conus extends to the L1-2 level. Conus and cauda equina appear normal.   Paraspinal and other soft tissues: Paraspinous soft tissues demonstrate no acute finding. Chronic fatty atrophy noted about the posterior paraspinous and psoas musculature.   Disc levels:   L1-2: Disc desiccation with mild disc bulge. Mild facet spurring. No spinal stenosis. Foramina remain patent.   L2-3: Disc desiccation with mild disc bulge. No spinal stenosis. Foramina remain patent.   L3-4: Degenerative intervertebral disc space narrowing with diffuse disc bulge. Associated reactive endplate spurring. Mild bilateral facet hypertrophy. Resultant mild left greater than right lateral recess stenosis. Central canal remains patent. Mild to moderate left with mild right L3 foraminal stenosis.   L4-5: Mild disc bulge with reactive endplate spurring. Mild to moderate facet hypertrophy. Resultant mild to moderate bilateral subarticular stenosis. Central canal remains patent. Mild to moderate left with moderate right L4 foraminal stenosis.   L5-S1: Retrolisthesis with degenerative intervertebral disc space narrowing. Central disc protrusion with slight inferior angulation and annular fissure indents the ventral thecal sac (series 12, image 33). Mild bilateral facet hypertrophy. Resultant mild bilateral subarticular stenosis. Central canal remains patent. Moderate bilateral L5 foraminal narrowing.   IMPRESSION: 1. No MRI evidence for acute infection or other abnormality within the lumbar spine. 2. Few subcentimeter T1 hypointense lesions about the visualized posterior iliac wings. These correspond with small sclerotic foci seen on most recent CT from 04/08/2022, presumably reflecting patient's known osseous metastatic disease. These were not hypermetabolic on prior PET-CT from 59/56/3875. 3. No other evidence for metastatic disease within the lumbar spine itself. 4. Chronic L2 compression fracture, stable.  Large Schmorl's node deformity with associated height loss at L5, also stable. 5. Multifactorial degenerative changes at L3-4 through L5-S1 with resultant mild to moderate bilateral  subarticular stenosis, with mild to moderate bilateral L3 through L5 foraminal narrowing as above.     Electronically Signed   By: Rise Mu M.D.   On: 10/02/2022 05:22   Assessment/Plan Intraparenchymal hemorrhage of brain [facility])  Neuropathy  Micheal Donovan presents with new onset focal seizure(s).  Given semiology, etiology is likely subclinical hemorrhagic focus within left frontal lobe, visible on susceptibility weighted imaging.    He should continue Keppra 500mg  BID.  We provided seizure education and precautions training today. Will defer EEG.  Progressive left leg weakness localizes best to lumbosacral nerve roots, known spondylosis with multiple foci of neuro-foraminal stenosis.  We recommended surgical evaluation with Dr. Loleta Dicker given worsening weakness over time and significant motor dysfunction.  For stroke prevention, recommended continuing Aspirin 81mg  daily and Atorvastatin 40mg  daily.  He will con't managing diabetes with insulin, diet.  We do not recommend resuming anticoagulation given recurrent hemorrhagic foci.  We appreciate the opportunity to participate in the care of DEONDREA HARNISCH.   We ask that Micheal Donovan return to clinic in 6 months or sooner if needed.  All questions were answered. The patient knows to call the clinic with any problems, questions or concerns. No barriers to learning were detected.  The total time spent in the encounter was 40 minutes and more than 50% was on counseling and review of test results   Henreitta Leber, MD Medical Director of Neuro-Oncology [facility] at [place] 12/12/22 11:04 AM

## 2022-12-15 ENCOUNTER — Encounter: Payer: Self-pay | Admitting: Family Medicine

## 2022-12-15 ENCOUNTER — Ambulatory Visit: Payer: PPO | Admitting: Family Medicine

## 2022-12-15 ENCOUNTER — Inpatient Hospital Stay (HOSPITAL_BASED_OUTPATIENT_CLINIC_OR_DEPARTMENT_OTHER): Payer: PPO | Admitting: Hospice and Palliative Medicine

## 2022-12-15 VITALS — BP 120/72 | HR 66 | Temp 97.5°F | Ht 67.0 in | Wt 122.0 lb

## 2022-12-15 DIAGNOSIS — R309 Painful micturition, unspecified: Secondary | ICD-10-CM

## 2022-12-15 DIAGNOSIS — Z515 Encounter for palliative care: Secondary | ICD-10-CM | POA: Diagnosis not present

## 2022-12-15 DIAGNOSIS — G893 Neoplasm related pain (acute) (chronic): Secondary | ICD-10-CM | POA: Diagnosis not present

## 2022-12-15 DIAGNOSIS — F419 Anxiety disorder, unspecified: Secondary | ICD-10-CM

## 2022-12-15 LAB — URINALYSIS, ROUTINE W REFLEX MICROSCOPIC
Bilirubin Urine: NEGATIVE
Glucose, UA: NEGATIVE
Hyaline Cast: NONE SEEN /[LPF]
Ketones, ur: NEGATIVE
Nitrite: POSITIVE — AB
Specific Gravity, Urine: 1.02 (ref 1.001–1.035)
Squamous Epithelial / HPF: NONE SEEN /[HPF] (ref <=5)
WBC, UA: >=60 /[HPF] — AB (ref 0–5)
pH: 6 (ref 5.0–8.0)

## 2022-12-15 LAB — MICROSCOPIC MESSAGE

## 2022-12-15 MED ORDER — CEFTRIAXONE SODIUM 1 G IJ SOLR
1.0000 g | Freq: Once | INTRAMUSCULAR | Status: AC
Start: 2022-12-15 — End: 2022-12-15
  Administered 2022-12-15: 1 g via INTRAMUSCULAR

## 2022-12-15 MED ORDER — SULFAMETHOXAZOLE-TRIMETHOPRIM 800-160 MG PO TABS
1.0000 | ORAL_TABLET | Freq: Two times a day (BID) | ORAL | 0 refills | Status: DC
Start: 2022-12-15 — End: 2022-12-30

## 2022-12-15 NOTE — Progress Notes (Signed)
Virtual Visit via Telephone Note  I connected with [name] on 12/15/22 at  3:40 PM EDT by telephone and verified that I am speaking with the correct person using two identifiers.  Location: Patient: Home Provider: Clinic   I discussed the limitations, risks, security and privacy concerns of performing an evaluation and management service by telephone and the availability of in person appointments. I also discussed with the patient that there may be a patient responsible charge related to this service. The patient expressed understanding and agreed to proceed.   History of Present Illness: Micheal Donovan is a 72 y.o. male with multiple medical problems including non-small cell lung cancer, history of subacute intraparenchymal hemorrhage, PE.  Patient is status post XRT.  He is on systemic chemo.  He is referred to palliative care to address goals and manage ongoing symptoms.    Observations/Objective: Telephone visit today.  Patient had visit earlier today with PCP and started on antibiotics for UTI.    Patient denies significant changes.  Remains weak and is mostly in wheelchair at home.  Home health discontinue services due to failure to progress.  Patient continues on pain regimen of Percocet/fentanyl.  Note that PCP has referred patient to neurosurgery.  Patient denies needing any refills today.  He says that his anxiety has been better controlled on citalopram.  He is not regularly needing alprazolam for breakthrough anxiety.  Assessment and Plan: Neoplasm related pain -continue fentanyl/Percocet.    Anxiety -continue citalopram 10 mg daily.  Follow Up Instructions: Follow-up telephone visit 1-2 months   I discussed the assessment and treatment plan with the patient. The patient was provided an opportunity to ask questions and all were answered. The patient agreed with the plan and demonstrated an understanding of the instructions.   The patient was advised to call back or  seek an in-person evaluation if the symptoms worsen or if the condition fails to improve as anticipated.  I provided 5 minutes of non-face-to-face time during this encounter.   [name], NP

## 2022-12-15 NOTE — Progress Notes (Signed)
Wt Readings from Last 3 Encounters:  12/15/22 122 lb (55.3 kg)  12/12/22 123 lb (55.8 kg)  12/01/22 123 lb 4.8 oz (55.9 kg)     Subjective:   Patient reports a 1 week history of dysuria, increased frequency, urgency, and hesitancy.  He appears ill today.  He is not septic.  There is no evidence of tachycardia.  However he does report chills.  His urine today is cloudy with +2 blood, positive nitrates, +3 leukocyte esterase.  He denies any nausea or vomiting.  He continues to suffer from severe low back pain and would like a referral for a second opinion with neurosurgery. Past Medical History:  Diagnosis Date   Allergy    BPH (benign prostatic hypertrophy)    Cancer (HCC)    Melanoma on Neck    2008   Carotid artery occlusion    CHF (congestive heart failure) (HCC)    Diabetes mellitus    type 2   ED (erectile dysfunction)    GERD (gastroesophageal reflux disease)    Hemorrhagic stroke (HCC) 06/2021   Hyperlipidemia    Hypertension    Lung cancer (HCC)    Neoplasm related pain    Retinopathy due to secondary DM [facility])    Past Surgical History:  Procedure Laterality Date   BRONCHIAL NEEDLE ASPIRATION BIOPSY  06/17/2021   Procedure: BRONCHIAL NEEDLE ASPIRATION BIOPSIES;  Surgeon: Lorin Glass, MD;  Location: [facility] ENDOSCOPY;  Service: Pulmonary;;   IR IMAGING GUIDED PORT INSERTION  08/05/2021   MELANOMA EXCISION  2008   Left side of neck   RADIOLOGY WITH ANESTHESIA N/A 04/05/2020   Procedure: MRI SPINE WITOUT CONTRAST;  Surgeon: Radiologist, Medication, MD;  Location: [facility] OR;  Service: Radiology;  Laterality: N/A;   RADIOLOGY WITH ANESTHESIA N/A 08/22/2021   Procedure: MRI BRAIN WITH AND WITHOUT CONTRAST  WITH ANESTHESIA;  Surgeon: Radiologist, Medication, MD;  Location: [facility] OR;  Service: Radiology;  Laterality: N/A;   RADIOLOGY WITH ANESTHESIA N/A 12/17/2021   Procedure: MRI BRAIN WITH AND WITHOUT CONTRAST WITH ANESTHESIA; MRI LUMBER WITH AND WITHOUT CONTRAST;  Surgeon: Radiologist,  Medication, MD;  Location: [facility] OR;  Service: Radiology;  Laterality: N/A;   RADIOLOGY WITH ANESTHESIA N/A 04/08/2022   Procedure: MRI LUMBER SPINE WITH AND WITHOUT CONTRAST;  Surgeon: Radiologist, Medication, MD;  Location: [facility] OR;  Service: Radiology;  Laterality: N/A;   TONSILLECTOMY     VIDEO BRONCHOSCOPY WITH ENDOBRONCHIAL ULTRASOUND N/A 06/17/2021   Procedure: VIDEO BRONCHOSCOPY WITH ENDOBRONCHIAL ULTRASOUND;  Surgeon: Lorin Glass, MD;  Location: [facility] ENDOSCOPY;  Service: Pulmonary;  Laterality: N/A;   Current Outpatient Medications on File Prior to Visit  Medication Sig Dispense Refill   ALPRAZolam (XANAX) 0.5 MG tablet Take 1 tablet (0.5 mg total) by mouth at bedtime as needed for anxiety. 15 tablet 0   aspirin EC 81 MG tablet Take 1 tablet (81 mg total) by mouth daily. Swallow whole. 30 tablet 12   citalopram (CELEXA) 10 MG tablet Take 1 tablet (10 mg total) by mouth daily. 30 tablet 3   docusate sodium (COLACE) 100 MG capsule Take 100 mg by mouth 2 (two) times daily as needed for mild constipation.     fentaNYL (DURAGESIC) 75 MCG/HR Place 1 patch onto the skin every 3 (three) days. 10 patch 0   finasteride (PROSCAR) 5 MG tablet Take 1 tablet (5 mg total) by mouth daily. 30 tablet 0   gabapentin (NEURONTIN) 300 MG capsule Take 2 capsules (600 mg total) by mouth  2 (two) times daily. 120 capsule 2   levETIRAcetam (KEPPRA) 500 MG tablet Take 1 tablet (500 mg total) by mouth 2 (two) times daily. 60 tablet 5   megestrol (MEGACE) 40 MG tablet Take 1 tablet (40 mg total) by mouth daily. 30 tablet 3   Multiple Vitamin (MULTIVITAMIN WITH MINERALS) TABS tablet Take 1 tablet by mouth daily.     NOVOLOG FLEXPEN 100 UNIT/ML FlexPen Inject 0-10 Units into the skin daily as needed for high blood sugar (BG > 200).     Omeprazole 20 MG TBEC Take 20 mg by mouth daily.     oxyCODONE-acetaminophen (PERCOCET) 7.5-325 MG tablet Take 1 tablet by mouth every 4 (four) hours as needed for severe pain. Do not take  with cough medication or other pain medication 60 tablet 0   traZODone (DESYREL) 50 MG tablet Take 1 tablet (50 mg total) by mouth at bedtime as needed for sleep. 30 tablet 3   [DISCONTINUED] prochlorperazine (COMPAZINE) 10 MG tablet Take 1 tablet (10 mg total) by mouth every 6 (six) hours as needed (Nausea or vomiting). 30 tablet 1   Current Facility-Administered Medications on File Prior to Visit  Medication Dose Route Frequency Provider Last Rate Last Admin   heparin lock flush 100 UNIT/ML injection              Allergies  Allergen Reactions   Niaspan [Niacin]     FLUSHING   Social History   Socioeconomic History   Marital status: Married    Spouse name: Not on file   Number of children: Not on file   Years of education: Not on file   Highest education level: Not on file  Occupational History   Not on file  Tobacco Use   Smoking status: Former    Current packs/day: 0.00    Types: Cigarettes    Quit date: 07/21/1990    Years since quitting: 32.4   Smokeless tobacco: Never  Vaping Use   Vaping status: Never Used  Substance and Sexual Activity   Alcohol use: No   Drug use: No   Sexual activity: Not Currently  Other Topics Concern   Not on file  Social History Narrative   Not on file   Social Determinants of Health   Financial Resource Strain: Low Risk  (04/17/2022)   Overall Financial Resource Strain (CARDIA)    Difficulty of Paying Living Expenses: Not hard at all  Food Insecurity: No Food Insecurity (10/01/2022)   Hunger Vital Sign    Worried About Running Out of Food in the Last Year: Never true    Ran Out of Food in the Last Year: Never true  Transportation Needs: No Transportation Needs (10/01/2022)   PRAPARE - Administrator, Civil Service (Medical): No    Lack of Transportation (Non-Medical): No  Physical Activity: Inactive (03/27/2022)   Exercise Vital Sign    Days of Exercise per Week: 0 days    Minutes of Exercise per Session: 0 min  Stress:  Stress Concern Present (03/27/2022)   [employer] of Occupational Health - Occupational Stress Questionnaire    Feeling of Stress : To some extent  Social Connections: Unknown (11/17/2022)   Received from [facility]   Social Network    Social Network: Not on file  Intimate Partner Violence: Unknown (11/17/2022)   Received from [facility]   HITS    Physically Hurt: Not on file    Insult or Talk Down To: Not on file  Threaten Physical Harm: Not on file    Scream or Curse: Not on file    Past Medical History:  Diagnosis Date   Allergy    BPH (benign prostatic hypertrophy)    Cancer (HCC)    Melanoma on Neck    2008   Carotid artery occlusion    CHF (congestive heart failure) (HCC)    Diabetes mellitus    type 2   ED (erectile dysfunction)    GERD (gastroesophageal reflux disease)    Hemorrhagic stroke (HCC) 06/2021   Hyperlipidemia    Hypertension    Lung cancer (HCC)    Neoplasm related pain    Retinopathy due to secondary DM [facility])    Past Surgical History:  Procedure Laterality Date   BRONCHIAL NEEDLE ASPIRATION BIOPSY  06/17/2021   Procedure: BRONCHIAL NEEDLE ASPIRATION BIOPSIES;  Surgeon: Lorin Glass, MD;  Location: [facility] ENDOSCOPY;  Service: Pulmonary;;   IR IMAGING GUIDED PORT INSERTION  08/05/2021   MELANOMA EXCISION  2008   Left side of neck   RADIOLOGY WITH ANESTHESIA N/A 04/05/2020   Procedure: MRI SPINE WITOUT CONTRAST;  Surgeon: Radiologist, Medication, MD;  Location: [facility] OR;  Service: Radiology;  Laterality: N/A;   RADIOLOGY WITH ANESTHESIA N/A 08/22/2021   Procedure: MRI BRAIN WITH AND WITHOUT CONTRAST  WITH ANESTHESIA;  Surgeon: Radiologist, Medication, MD;  Location: [facility] OR;  Service: Radiology;  Laterality: N/A;   RADIOLOGY WITH ANESTHESIA N/A 12/17/2021   Procedure: MRI BRAIN WITH AND WITHOUT CONTRAST WITH ANESTHESIA; MRI LUMBER WITH AND WITHOUT CONTRAST;  Surgeon: Radiologist, Medication, MD;  Location: [facility] OR;  Service: Radiology;  Laterality: N/A;    RADIOLOGY WITH ANESTHESIA N/A 04/08/2022   Procedure: MRI LUMBER SPINE WITH AND WITHOUT CONTRAST;  Surgeon: Radiologist, Medication, MD;  Location: [facility] OR;  Service: Radiology;  Laterality: N/A;   TONSILLECTOMY     VIDEO BRONCHOSCOPY WITH ENDOBRONCHIAL ULTRASOUND N/A 06/17/2021   Procedure: VIDEO BRONCHOSCOPY WITH ENDOBRONCHIAL ULTRASOUND;  Surgeon: Lorin Glass, MD;  Location: [facility] ENDOSCOPY;  Service: Pulmonary;  Laterality: N/A;   Current Outpatient Medications on File Prior to Visit  Medication Sig Dispense Refill   ALPRAZolam (XANAX) 0.5 MG tablet Take 1 tablet (0.5 mg total) by mouth at bedtime as needed for anxiety. 15 tablet 0   aspirin EC 81 MG tablet Take 1 tablet (81 mg total) by mouth daily. Swallow whole. 30 tablet 12   citalopram (CELEXA) 10 MG tablet Take 1 tablet (10 mg total) by mouth daily. 30 tablet 3   docusate sodium (COLACE) 100 MG capsule Take 100 mg by mouth 2 (two) times daily as needed for mild constipation.     fentaNYL (DURAGESIC) 75 MCG/HR Place 1 patch onto the skin every 3 (three) days. 10 patch 0   finasteride (PROSCAR) 5 MG tablet Take 1 tablet (5 mg total) by mouth daily. 30 tablet 0   gabapentin (NEURONTIN) 300 MG capsule Take 2 capsules (600 mg total) by mouth 2 (two) times daily. 120 capsule 2   levETIRAcetam (KEPPRA) 500 MG tablet Take 1 tablet (500 mg total) by mouth 2 (two) times daily. 60 tablet 5   megestrol (MEGACE) 40 MG tablet Take 1 tablet (40 mg total) by mouth daily. 30 tablet 3   Multiple Vitamin (MULTIVITAMIN WITH MINERALS) TABS tablet Take 1 tablet by mouth daily.     NOVOLOG FLEXPEN 100 UNIT/ML FlexPen Inject 0-10 Units into the skin daily as needed for high blood sugar (BG > 200).     Omeprazole  20 MG TBEC Take 20 mg by mouth daily.     oxyCODONE-acetaminophen (PERCOCET) 7.5-325 MG tablet Take 1 tablet by mouth every 4 (four) hours as needed for severe pain. Do not take with cough medication or other pain medication 60 tablet 0   traZODone  (DESYREL) 50 MG tablet Take 1 tablet (50 mg total) by mouth at bedtime as needed for sleep. 30 tablet 3   [DISCONTINUED] prochlorperazine (COMPAZINE) 10 MG tablet Take 1 tablet (10 mg total) by mouth every 6 (six) hours as needed (Nausea or vomiting). 30 tablet 1   Current Facility-Administered Medications on File Prior to Visit  Medication Dose Route Frequency Provider Last Rate Last Admin   heparin lock flush 100 UNIT/ML injection            Allergies  Allergen Reactions   Niaspan [Niacin]     FLUSHING   Social History   Socioeconomic History   Marital status: Married    Spouse name: Not on file   Number of children: Not on file   Years of education: Not on file   Highest education level: Not on file  Occupational History   Not on file  Tobacco Use   Smoking status: Former    Current packs/day: 0.00    Types: Cigarettes    Quit date: 07/21/1990    Years since quitting: 32.4   Smokeless tobacco: Never  Vaping Use   Vaping status: Never Used  Substance and Sexual Activity   Alcohol use: No   Drug use: No   Sexual activity: Not Currently  Other Topics Concern   Not on file  Social History Narrative   Not on file   Social Determinants of Health   Financial Resource Strain: Low Risk  (04/17/2022)   Overall Financial Resource Strain (CARDIA)    Difficulty of Paying Living Expenses: Not hard at all  Food Insecurity: No Food Insecurity (10/01/2022)   Hunger Vital Sign    Worried About Running Out of Food in the Last Year: Never true    Ran Out of Food in the Last Year: Never true  Transportation Needs: No Transportation Needs (10/01/2022)   PRAPARE - Administrator, Civil Service (Medical): No    Lack of Transportation (Non-Medical): No  Physical Activity: Inactive (03/27/2022)   Exercise Vital Sign    Days of Exercise per Week: 0 days    Minutes of Exercise per Session: 0 min  Stress: Stress Concern Present (03/27/2022)   [employer] of Occupational  Health - Occupational Stress Questionnaire    Feeling of Stress : To some extent  Social Connections: Unknown (11/17/2022)   Received from [facility]   Social Network    Social Network: Not on file  Intimate Partner Violence: Unknown (11/17/2022)   Received from [facility]   HITS    Physically Hurt: Not on file    Insult or Talk Down To: Not on file    Threaten Physical Harm: Not on file    Scream or Curse: Not on file      Review of Systems  Musculoskeletal:  Positive for back pain.  All other systems reviewed and are negative.      Objective:   Physical Exam Vitals reviewed.  Constitutional:      General: He is not in acute distress.    Appearance: He is underweight. He is ill-appearing.  Cardiovascular:     Rate and Rhythm: Normal rate and regular rhythm.  Heart sounds: Normal heart sounds. No murmur heard.    No friction rub. No gallop.  Pulmonary:     Effort: Pulmonary effort is normal. No respiratory distress.     Breath sounds: Normal breath sounds. No stridor. No wheezing, rhonchi or rales.  Musculoskeletal:       Back:     Right lower leg: No edema.     Left lower leg: No edema.  Neurological:     General: No focal deficit present.     Mental Status: He is alert and oriented to person, place, and time.     Sensory: Sensation is intact.     Motor: Weakness present. No tremor, seizure activity or pronator drift.     Gait: Gait is intact.           Assessment & Plan:  Painful urination - Plan: Urinalysis, Routine w reflex microscopic, Urine Culture Patient appears to have a urinary tract infection.  Begin Rocephin 1 g IM x 1 now.  Start Bactrim double strength tablets twice daily for 7 days.  Recheck PICC line in 24 hours Reports.  Once patient recovers from urinary tract infection I would be glad to arrange a second opinion with neurosurgery.

## 2022-12-15 NOTE — Addendum Note (Signed)
Addended by: Venia Carbon K on: 12/15/2022 03:18 PM   Modules accepted: Orders

## 2022-12-17 LAB — URINE CULTURE
MICRO NUMBER:: 15590782
SPECIMEN QUALITY:: ADEQUATE

## 2022-12-19 ENCOUNTER — Ambulatory Visit
Admission: RE | Admit: 2022-12-19 | Discharge: 2022-12-19 | Disposition: A | Discharge status: Home / Self Care | Payer: PPO | Source: Ambulatory Visit | Attending: Radiation Oncology | Admitting: Radiation Oncology

## 2022-12-19 DIAGNOSIS — C3412 Malignant neoplasm of upper lobe, left bronchus or lung: Secondary | ICD-10-CM | POA: Insufficient documentation

## 2022-12-19 DIAGNOSIS — J984 Other disorders of lung: Secondary | ICD-10-CM | POA: Diagnosis not present

## 2022-12-19 DIAGNOSIS — I700 Atherosclerosis of aorta: Secondary | ICD-10-CM | POA: Diagnosis not present

## 2022-12-19 DIAGNOSIS — C3490 Malignant neoplasm of unspecified part of unspecified bronchus or lung: Secondary | ICD-10-CM | POA: Diagnosis not present

## 2022-12-19 MED ORDER — IOHEXOL 300 MG/ML  SOLN
75.0000 mL | Freq: Once | INTRAMUSCULAR | Status: AC | PRN
  Administered 2022-12-19: 75 mL via INTRAVENOUS

## 2022-12-22 ENCOUNTER — Encounter: Payer: Self-pay | Admitting: Oncology

## 2022-12-22 ENCOUNTER — Inpatient Hospital Stay (HOSPITAL_BASED_OUTPATIENT_CLINIC_OR_DEPARTMENT_OTHER): Payer: PPO | Admitting: Oncology

## 2022-12-22 ENCOUNTER — Inpatient Hospital Stay: Payer: PPO

## 2022-12-22 VITALS — BP 124/54 | HR 67 | Temp 98.0°F | Resp 18

## 2022-12-22 VITALS — BP 127/77 | HR 80 | Temp 97.2°F | Resp 18 | Wt 121.2 lb

## 2022-12-22 DIAGNOSIS — G8929 Other chronic pain: Secondary | ICD-10-CM

## 2022-12-22 DIAGNOSIS — C7951 Secondary malignant neoplasm of bone: Secondary | ICD-10-CM

## 2022-12-22 DIAGNOSIS — I2699 Other pulmonary embolism without acute cor pulmonale: Secondary | ICD-10-CM

## 2022-12-22 DIAGNOSIS — R29898 Other symptoms and signs involving the musculoskeletal system: Secondary | ICD-10-CM

## 2022-12-22 DIAGNOSIS — Z5112 Encounter for antineoplastic immunotherapy: Secondary | ICD-10-CM | POA: Diagnosis not present

## 2022-12-22 DIAGNOSIS — M549 Dorsalgia, unspecified: Secondary | ICD-10-CM

## 2022-12-22 DIAGNOSIS — C3412 Malignant neoplasm of upper lobe, left bronchus or lung: Secondary | ICD-10-CM

## 2022-12-22 DIAGNOSIS — I619 Nontraumatic intracerebral hemorrhage, unspecified: Secondary | ICD-10-CM

## 2022-12-22 LAB — CBC WITH DIFFERENTIAL/PLATELET
Abs Immature Granulocytes: 0.04 10*3/uL (ref 0.00–0.07)
Basophils Absolute: 0.1 10*3/uL (ref 0.0–0.1)
Basophils Relative: 1 %
Eosinophils Absolute: 0.5 10*3/uL (ref 0.0–0.5)
Eosinophils Relative: 7 %
HCT: 38.7 % — ABNORMAL LOW (ref 39.0–52.0)
Hemoglobin: 12.6 g/dL — ABNORMAL LOW (ref 13.0–17.0)
Immature Granulocytes: 1 %
Lymphocytes Relative: 14 %
Lymphs Abs: 1 10*3/uL (ref 0.7–4.0)
MCH: 29.9 pg (ref 26.0–34.0)
MCHC: 32.6 g/dL (ref 30.0–36.0)
MCV: 91.9 fL (ref 80.0–100.0)
Monocytes Absolute: 0.5 10*3/uL (ref 0.1–1.0)
Monocytes Relative: 7 %
Neutro Abs: 5 10*3/uL (ref 1.7–7.7)
Neutrophils Relative %: 70 %
Platelets: 279 10*3/uL (ref 150–400)
RBC: 4.21 MIL/uL — ABNORMAL LOW (ref 4.22–5.81)
RDW: 14.1 % (ref 11.5–15.5)
WBC: 7.1 10*3/uL (ref 4.0–10.5)
nRBC: 0 % (ref 0.0–0.2)

## 2022-12-22 LAB — COMPREHENSIVE METABOLIC PANEL
ALT: 11 U/L (ref 0–44)
AST: 19 U/L (ref 15–41)
Albumin: 3.9 g/dL (ref 3.5–5.0)
Alkaline Phosphatase: 64 U/L (ref 38–126)
Anion gap: 8 (ref 5–15)
BUN: 21 mg/dL (ref 8–23)
CO2: 23 mmol/L (ref 22–32)
Calcium: 8.7 mg/dL — ABNORMAL LOW (ref 8.9–10.3)
Chloride: 101 mmol/L (ref 98–111)
Creatinine, Ser: 1.09 mg/dL (ref 0.61–1.24)
GFR, Estimated: >60 mL/min (ref >60)
Glucose, Bld: 82 mg/dL (ref 70–99)
Potassium: 4 mmol/L (ref 3.5–5.1)
Sodium: 132 mmol/L — ABNORMAL LOW (ref 135–145)
Total Bilirubin: 0.3 mg/dL (ref 0.3–1.2)
Total Protein: 7 g/dL (ref 6.5–8.1)

## 2022-12-22 MED ORDER — HEPARIN SOD (PORK) LOCK FLUSH 100 UNIT/ML IV SOLN
500.0000 [IU] | Freq: Once | INTRAVENOUS | Status: AC | PRN
  Administered 2022-12-22: 500 [IU]
  Filled 2022-12-22: qty 5

## 2022-12-22 MED ORDER — SODIUM CHLORIDE 0.9 % IV SOLN
Freq: Once | INTRAVENOUS | Status: AC
  Filled 2022-12-22: qty 250

## 2022-12-22 MED ORDER — SODIUM CHLORIDE 0.9 % IV SOLN
200.0000 mg | Freq: Once | INTRAVENOUS | Status: AC
  Administered 2022-12-22: 200 mg via INTRAVENOUS
  Filled 2022-12-22: qty 200

## 2022-12-22 NOTE — Progress Notes (Signed)
Hematology/Oncology Progress note [phone] Fax:([phone]) [phone]       CHIEF COMPLAINTS/REASON FOR VISIT:  Metastatic non-small cell lung cancer  ASSESSMENT & PLAN:   Cancer Staging  Primary non-small cell carcinoma of upper lobe of left lung (HCC) Staging form: Lung, AJCC 8th Edition - Clinical: Stage IV (cT2b, cN1, cM1) - Signed by [name], MD on 08/06/2021   Primary non-small cell carcinoma of upper lobe of left lung (HCC) Stage IV lung adenocarcinoma with brain and bone metastasis.  S/p lung radiation to left lung.  Labs are reviewed and discussed with patient. Proceed with Keytruda  12/19/2022, CT chest pending official read  Pulmonary embolus (HCC) Off Eliquis 2.5mg  BID due to concern of microbleeds in brain  Metastasis to bone (HCC) Recommend Zometa every 4- 6 weeks.-  hold off today due to possible upcoming surgery Continue calcium supplementation.  MRI lumbar with and without contrast showed  Stable remote superior endplate fracture of L2 and large Schmorl to noted involving L5. 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 PET scan shows stable bone lesions.  L4 fracture uptake likely benign- s/p nerve block - pending neurosurgery evaluation.  Hold off Zometa given possibility of upcoming back surgery.  Encounter for antineoplastic immunotherapy Immunotherapy plan as listed above  Back pain Back pain, MRI lumbar showed DJD, L4-L5 synovial cyst,PET scan showed L4 fracture Continue PRN percocet 7.5mg /325mg , fentanyl patch Q72 hours     Weakness of left lower extremity Acute on chronic. Currently on Keppra for presumed seizure. Follow-up with neurology.  Pending evaluation by neurosurgeon.   Intraparenchymal hemorrhage of brain (HCC) Hold off Eliquis per neurology Dr. Barbaraann Cao 10/02/22 Repeat MRI brain-stable findings    Follow up 3 weeks All questions were answered. The  patient knows to call the clinic with any problems, questions or concerns.  [name], MD, [name] [facility] Hematology Oncology 12/22/2022      HISTORY OF PRESENTING ILLNESS:   Micheal Donovan is a  72 y.o.  male presents for follow up of Non-small cell lung cancer.  Oncology History Overview Note  Diagnosis: Stage IIB T2b N1 M0 adenocarcinoma of the LUL, poorly differentiated     Primary non-small cell carcinoma of upper lobe of left lung (HCC)  06/13/2021 Initial Diagnosis   Primary non-small cell carcinoma of upper lobe of left lung [facility]) -06/20/2021 - 06/27/2021, patient presented to [facility] due to progressive headache/dizziness/gait changes. CT head showed acute to subacute intraparenchymal hemorrhage involving the left cerebellum.  Surrounding low-density vasogenic edema.  Patient was transferred to [facility]. This was further evaluated by CT angiogram of the neck which showed bulky calcified plaques/stenosis of carotid artery, Followed by MRI brain. 06/12/2021, MRI of the brain showed no significant interval change in size of the left cerebellar intraparenchymal hematoma with unchanged regional mass effect and partial effacement of fourth ventricle but no upstream hydrocephalus. There is no discernible enhancement to suggest underlying mass lesion, though acute blood products could mask enhancement.   06/12/2021 a chest x-ray showed a 4.4 cm left middle lobe. 06/13/2021, CT chest with contrast showed a 4.3 x 3.6 x 3.3 cm lobular spiculated mass in the posterior left upper lobe with T3 to the lateral pleura and major fissure.  Metastatic left hilar lymphadenopathy.  Peripheral micronodularity posterior right costophrenic sulcus.  Aortic atherosclerosis. 06/14/2021, CT abdomen pelvis showed right lower lobe pulmonary artery embolus.  No evidence of right heart strain.  No acute intra-abdominal or pelvic pathology.  Aortic atherosclerosis.  06/13/2021, patient underwent bronchoscopy  with EBUS by Dr. Katrinka Blazing.  Biopsy from the fine-needle aspiration of station 11 mL showed malignant cells, consistent with poorly differentiated non-small cell carcinoma, consistent with adenocarcinoma.  Malignant cells are TTF-1 positive and negative for p40.  Negative for neuroendocrine markers.  Blood test and tissue sample were tested for Gardant 360  -PD-L1 TPS 97%, no actionable mutation on the blood testing. 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   07/17/2021 Cancer Staging   Staging form: Lung, AJCC 8th Edition - Clinical: Stage IV (cT2b, cN1, cM1) - Signed by [name], MD on 08/06/2021   08/06/2021 Imaging   I saw patient's PET scan after his visit with me on 08/06/21. PET scan was ordered by his previous oncologist group and did not come to my in basket.. Patient received cycle 1 carboplatin Taxol today.  He has started on radiation.  5/26/3 PET scan showed 3.8 cm hypermetabolic left upper lobe mass with hypermetabolic left hilar and infrahilar adenopathy.  There is approximately 8 scattered metastatic lesions in the skeleton. Mixed density photopenic lesion in the left cerebellum. characterized as hemorrhage on recent prior imaging workups.    08/16/2021 -  Chemotherapy   Patient is on Treatment Plan : LUNG Carboplatin (5) + Pemetrexed + Pembrolizumab (200) D1 q21d Induction x 4 cycles / Maintenance Pemetrexed + Pembrolizumab (200) D1 q21d      08/22/2021 Imaging   MRI brain w wo contrast  Decrease in size of left cerebellar hematoma with resolution of edema. No evidence of underlying lesion. Smaller, more recent hemorrhage in the left cerebellar vermis with minimal edema. There is minimal enhancement without definite evidence of underlying lesion.  New punctate focus of chronic blood products and enhancement in the left frontal lobe. Additional new foci of chronic blood products in the posterior right putamen, right parietal subcortical white matter, and posterior right  cerebellum. Unclear at this time if these represent foci of bland hemorrhage or early metastases.   Increase in size of right parietal osseous metastasis with minor extraosseous extension.     Imaging   PET scan showed 1. Interval response to therapy as evidenced by a small residual left upper lobe nodule with decreased hypermetabolism, no residual hypermetabolic adenopathy and decreased hypermetabolism associatedwith osseous metastases. 2. 6 mm posterior left upper lobe nodule, likely stable. Recommend attention on follow-up. 3. Aortic atherosclerosis (ICD10-I70.0). Coronary artery calcification.   12/17/2021 Imaging   MRI lumbar spine w wo contrast  1. No evidence of regional metastatic disease. 2. Late subacute superior endplate fracture at L2 and large superior endplate Schmorl's node at L5, unchanged since the PET scan of 10/29/2021. 3. L3-4: Shallow disc protrusion. Facet and ligamentous hypertrophy. Stenosis of both lateral recesses. Findings slightly worsened since 2022. 4. L4-5: Shallow disc protrusion. Facet and ligamentous hypertrophy. Small synovial cyst arising from the facet joint on the right. Stenosis of the lateral recesses right worse than left. Foraminal narrowing right worse than left. Findings have worsened since 2022.  5. L5-S1: Endplate osteophytes and shallow protrusion of the disc. Facet and ligamentous hypertrophy. Stenosis of the subarticular lateral recesses and neural foramina, right worse than left. Similar appearance to the study of 2022. 6. Continued evidence of enteritis of at least 1 loop of small bowel. Distended bladder present   12/18/2021 Imaging   Brian MRI w wo  1. Interval decrease in size of the left cerebellar and vermian hematomas. No  definite evidence of underlying metastatic lesion. 2. There is are two possible contrast enhancing lesions in the posterior left frontal lobe and left occipital lobe, which do not have intrinsic T1 signal abnormality,  but have a somewhat linear appearance and may be vascular in nature. Recommend attention on follow up. 3. Multifocal sites of susceptibility artifact are redemonstrated with interval development of a few new foci, as described above. All of these sites demonstrate intrinsic T1 hyperintense signal abnormality and susceptibility artifact and are compatible with sites of microhemorrhages. Recommend continued attention on follow up. 4. Interval decrease in size of right parietal calvarial metastatic lesion. No new contrast enhancing lesions visualized. 5. Diffusely heterogeneous marrow signal throughout the cervical spine, which is nonspecific but can be seen in the setting of anemia, smoking, obesity, or a marrow replacement process.     12/18/2021 Imaging   MRI lumbar spine w wo contrast  1. No evidence of regional metastatic disease. 2. Late subacute superior endplate fracture at L2 and large superior endplate Schmorl's node at L5, unchanged since the PET scan of 10/29/2021 3. L3-4: Shallow disc protrusion. Facet and ligamentous hypertrophy.Stenosis of both lateral recesses. Findings slightly worsened since 2022. 4. L4-5: Shallow disc protrusion. Facet and ligamentous hypertrophy. Small synovial cyst arising from the facet joint on the right. Stenosis of the lateral recesses right worse than left. Foraminal narrowing right worse than left. Findings have worsened since 2022. 5. L5-S1: Endplate osteophytes and shallow protrusion of the disc. Facet and ligamentous hypertrophy. Stenosis of the subarticular lateral recesses and neural foramina, right worse than left. Similar appearance to the study of 2022. 6. Continued evidence of enteritis of at least 1 loop of small bowel. Distended bladder present.       01/28/2022 Imaging   CT chest abdomen pelvis w contrast 1. Treated left upper lobe mass appears grossly stable to the prior examination when measured in a similar fashion on the prior study.  No definite signs of extra skeletal metastatic disease noted in the chest, abdomen or pelvis. 2. Widespread skeletal metastases redemonstrated, as above. 3. Hepatic steatosis. 4. Aortic atherosclerosis, in addition to left main and three-vessel coronary artery disease. Assessment for potential risk factor modification, dietary therapy or pharmacologic therapy may be warranted, if clinically indicated. 5. Additional incidental findings,    04/08/2022 Imaging   MRI lumbar spine w wo contrast  1. Stable remote superior endplate fracture of L2 and large Schmorl to noted involving L5. No worrisome bone lesions to suggest metastatic disease. 2. Degenerative lumbar spondylosis with multilevel disc disease and facet disease which appears relatively stable as detailed above. 3. Enlarging right-sided synovial cyst at L4-5 with progressive mass effect on the right side of the thecal sac. This could potentially be symptomatic   04/28/2022 Imaging   CT chest abdomen pelvis wo contrast  1. Similar versus slightly decreased size of treated left upper lobe primary bronchogenic carcinoma. 2. Similar osseous metastasis. 3. No evidence of extraosseous metastatic disease. 4. New small right pleural effusion. 5. Coronary artery atherosclerosis. Aortic Atherosclerosis   07/03/2022 Imaging   PET scan showed 1. Interval development of a small focus of intense hypermetabolism within the left upper lobe scar. This is associated with a 5 mm perifissural nodule in the left lung which is hypermetabolic. Hypermetabolism at both of these locations is new since prior PET-CT and highly suspicious for recurrent disease. 2. No evidence for hypermetabolic soft tissue metastatic disease in the neck, abdomen, or pelvis. 3. Similar appearance of sclerotic  and lytic bone lesions without hypermetabolism on PET imaging. 4. Focal hypermetabolism associated with a fracture of the right L4 transverse process. The fracture was visible on  the previous CT of 04/28/2022. No associated soft tissue lesion to suggest that this is pathologic in nature. Tracer accumulation on today's study is in keeping with healing fracture. There is also some minimal uptake in a prominent spur associated with the L4 superior endplate, most likely degenerative. 5.  Aortic Atherosclerosis    08/01/2022 - 08/14/2022 Radiation Therapy   Radiation to left lung.    10/01/2022 Imaging   chest angiogram PE protocol  No pulmonary embolism identified.   Stable nodular opacity in the left upper lobe. Please correlate for history of treated lung cancer. There is an adjacent small nodule along the course of the interlobar fissure which is slightly larger today compared to the study of February 2024. please correlate with findings of the recent PET-CT of 07/03/2022.      10/02/2022 Imaging   MRI brain with and without contrast 1. No acute intracranial abnormality. No evidence for intraparenchymal metastatic disease. 2. Chronic left cerebellar hemorrhage with multiple additional chronic micro hemorrhages elsewhere throughout the brain, stable. 3. Underlying mild chronic microvascular ischemic disease.    10/02/2022 Imaging   MRI lumbar spine showed 1. No MRI evidence for acute infection or other abnormality within the lumbar spine. 2. Few subcentimeter T1 hypointense lesions about the visualized posterior iliac wings. These correspond with small sclerotic foci seen on most recent CT from 04/08/2022, presumably reflecting patient's known osseous metastatic disease. These were not hypermetabolic on prior PET-CT from 81/19/1478. 3. No other evidence for metastatic disease within the lumbar spine itself. 4. Chronic L2 compression fracture, stable. Large Schmorl's node deformity with associated height loss at L5, also stable. 5. Multifactorial degenerative changes at L3-4 through L5-S1 with resultant mild to moderate bilateral subarticular stenosis, with mild  to moderate bilateral L3 through L5 foraminal narrowing as above.   # Patient has a history of melanoma on his neck, treated in 2009. # History of hemorrhagic infarct left cerebellum  10/01/2022 - 10/06/2022 patient was admitted to the hospital due to lower extremity weakness and intermittent aphasia. Patient reports acute on chronic left lower extremity weakness, weakness has been more prominent over the past 2 weeks.  He reports significant episode of difficulty with ambulation 2 days after his last Keytruda treatments.  He reports that he felt loss of control of muscle at that time.  Patient reports that the weakness is different than her chronic lower extremity  weakness.  Patient was found to have orthostatic hypotension Flomax irbesartan were held.  CK wnl, Neurology was consulted and started the patient on Keppra for possible seizure.  EEG was negative.   INTERVAL HISTORY Micheal Donovan is a 72 y.o. male who has above history reviewed by me today presents for follow up visit for management of  Stage IV lung adenocarcinoma Accompanied by wife.  + back pain, and left lower extremity weakness due to pain, on fentanyl patch Q72 hours and percocet Patient was recently seen by neurology Dr. Barbaraann Cao.  Lower extremity weakness localizes best to lumbosacral nerve roots, known spondylosis with multiple foci of neuro-foraminal stenosis.  Pending neurosurgery evaluation.    Review of Systems  Constitutional:  Positive for appetite change, fatigue and unexpected weight change. Negative for chills and fever.  HENT:   Negative for hearing loss and voice change.   Eyes:  Negative for eye problems  and icterus.  Respiratory:  Positive for shortness of breath. Negative for chest tightness and cough.   Cardiovascular:  Positive for leg swelling. Negative for chest pain.  Gastrointestinal:  Negative for abdominal distention, abdominal pain and blood in stool.  Endocrine: Negative for hot flashes.   Genitourinary:  Negative for difficulty urinating, dysuria and frequency.   Musculoskeletal:  Positive for back pain. Negative for arthralgias.  Skin:  Negative for itching and rash.  Neurological:  Negative for extremity weakness, headaches, light-headedness and numbness.  Hematological:  Negative for adenopathy. Does not bruise/bleed easily.  Psychiatric/Behavioral:  Positive for sleep disturbance. Negative for confusion.     MEDICAL HISTORY:  Past Medical History:  Diagnosis Date   Allergy    BPH (benign prostatic hypertrophy)    Cancer (HCC)    Melanoma on Neck    2008   Carotid artery occlusion    CHF (congestive heart failure) (HCC)    Diabetes mellitus    type 2   ED (erectile dysfunction)    GERD (gastroesophageal reflux disease)    Hemorrhagic stroke (HCC) 06/2021   Hyperlipidemia    Hypertension    Lung cancer (HCC)    Neoplasm related pain    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)     SURGICAL HISTORY: Past Surgical History:  Procedure Laterality Date   BRONCHIAL NEEDLE ASPIRATION BIOPSY  06/17/2021   Procedure: BRONCHIAL NEEDLE ASPIRATION BIOPSIES;  Surgeon: Lorin Glass, MD;  Location: [facility] ENDOSCOPY;  Service: Pulmonary;;   IR IMAGING GUIDED PORT INSERTION  08/05/2021   MELANOMA EXCISION  2008   Left side of neck   RADIOLOGY WITH ANESTHESIA N/A 04/05/2020   Procedure: MRI SPINE WITOUT CONTRAST;  Surgeon: Radiologist, Medication, MD;  Location: [facility] OR;  Service: Radiology;  Laterality: N/A;   RADIOLOGY WITH ANESTHESIA N/A 08/22/2021   Procedure: MRI BRAIN WITH AND WITHOUT CONTRAST  WITH ANESTHESIA;  Surgeon: Radiologist, Medication, MD;  Location: [facility] OR;  Service: Radiology;  Laterality: N/A;   RADIOLOGY WITH ANESTHESIA N/A 12/17/2021   Procedure: MRI BRAIN WITH AND WITHOUT CONTRAST WITH ANESTHESIA; MRI LUMBER WITH AND WITHOUT CONTRAST;  Surgeon: Radiologist, Medication, MD;  Location: [facility] OR;  Service: Radiology;  Laterality: N/A;   RADIOLOGY WITH ANESTHESIA N/A  04/08/2022   Procedure: MRI LUMBER SPINE WITH AND WITHOUT CONTRAST;  Surgeon: Radiologist, Medication, MD;  Location: [facility] OR;  Service: Radiology;  Laterality: N/A;   TONSILLECTOMY     VIDEO BRONCHOSCOPY WITH ENDOBRONCHIAL ULTRASOUND N/A 06/17/2021   Procedure: VIDEO BRONCHOSCOPY WITH ENDOBRONCHIAL ULTRASOUND;  Surgeon: Lorin Glass, MD;  Location: [facility] ENDOSCOPY;  Service: Pulmonary;  Laterality: N/A;    SOCIAL HISTORY: Social History   Socioeconomic History   Marital status: Married    Spouse name: Not on file   Number of children: Not on file   Years of education: Not on file   Highest education level: Not on file  Occupational History   Not on file  Tobacco Use   Smoking status: Former    Current packs/day: 0.00    Types: Cigarettes    Quit date: 07/21/1990    Years since quitting: 32.4   Smokeless tobacco: Never  Vaping Use   Vaping status: Never Used  Substance and Sexual Activity   Alcohol use: No   Drug use: No   Sexual activity: Not Currently  Other Topics Concern   Not on file  Social History Narrative   Not on file   Social Determinants of Health  Financial Resource Strain: Low Risk  (04/17/2022)   Overall Financial Resource Strain (CARDIA)    Difficulty of Paying Living Expenses: Not hard at all  Food Insecurity: No Food Insecurity (10/01/2022)   Hunger Vital Sign    Worried About Running Out of Food in the Last Year: Never true    Ran Out of Food in the Last Year: Never true  Transportation Needs: No Transportation Needs (10/01/2022)   PRAPARE - Administrator, Civil Service (Medical): No    Lack of Transportation (Non-Medical): No  Physical Activity: Inactive (03/27/2022)   Exercise Vital Sign    Days of Exercise per Week: 0 days    Minutes of Exercise per Session: 0 min  Stress: Stress Concern Present (03/27/2022)   [employer] of Occupational Health - Occupational Stress Questionnaire    Feeling of Stress : To some extent  Social  Connections: Unknown (11/17/2022)   Received [facility]   Social Network    Social Network: Not on file  Intimate Partner Violence: Unknown (11/17/2022)   Received from [facility]   HITS    Physically Hurt: Not on file    Insult or Talk Down To: Not on file    Threaten Physical Harm: Not on file    Scream or Curse: Not on file    FAMILY HISTORY: Family History  Problem Relation Age of Onset   COPD Mother    Heart disease Father    Heart disease Brother        MI at age 57   Stroke Neg Hx     ALLERGIES:  is allergic to niaspan [niacin].  MEDICATIONS:  Current Outpatient Medications  Medication Sig Dispense Refill   ALPRAZolam (XANAX) 0.5 MG tablet Take 1 tablet (0.5 mg total) by mouth at bedtime as needed for anxiety. 15 tablet 0   citalopram (CELEXA) 10 MG tablet Take 1 tablet (10 mg total) by mouth daily. 30 tablet 3   docusate sodium (COLACE) 100 MG capsule Take 100 mg by mouth 2 (two) times daily as needed for mild constipation.     fentaNYL (DURAGESIC) 75 MCG/HR Place 1 patch onto the skin every 3 (three) days. 10 patch 0   finasteride (PROSCAR) 5 MG tablet Take 1 tablet (5 mg total) by mouth daily. 30 tablet 0   gabapentin (NEURONTIN) 300 MG capsule Take 2 capsules (600 mg total) by mouth 2 (two) times daily. 120 capsule 2   levETIRAcetam (KEPPRA) 500 MG tablet Take 1 tablet (500 mg total) by mouth 2 (two) times daily. 60 tablet 5   megestrol (MEGACE) 40 MG tablet Take 1 tablet (40 mg total) by mouth daily. 30 tablet 3   Multiple Vitamin (MULTIVITAMIN WITH MINERALS) TABS tablet Take 1 tablet by mouth daily.     NOVOLOG FLEXPEN 100 UNIT/ML FlexPen Inject 0-10 Units into the skin daily as needed for high blood sugar (BG > 200).     Omeprazole 20 MG TBEC Take 20 mg by mouth daily.     oxyCODONE-acetaminophen (PERCOCET) 7.5-325 MG tablet Take 1 tablet by mouth every 4 (four) hours as needed for severe pain. Do not take with cough medication or other pain medication 60  tablet 0   sulfamethoxazole-trimethoprim (BACTRIM DS) 800-160 MG tablet Take 1 tablet by mouth 2 (two) times daily. 14 tablet 0   traZODone (DESYREL) 50 MG tablet Take 1 tablet (50 mg total) by mouth at bedtime as needed for sleep. (Patient not taking: Reported on 12/22/2022)  30 tablet 3   No current facility-administered medications for this visit.   Facility-Administered Medications Ordered in Other Visits  Medication Dose Route Frequency Provider Last Rate Last Admin   heparin lock flush 100 UNIT/ML injection              PHYSICAL EXAMINATION:  Vitals:   12/22/22 1321  BP: 127/77  Pulse: 80  Resp: 18  Temp: (!) 97.2 F (36.2 C)   Filed Weights   12/22/22 1321  Weight: 121 lb 3.2 oz (55 kg)    Physical Exam Constitutional:      General: He is not in acute distress.    Appearance: He is ill-appearing.  HENT:     Head: Normocephalic and atraumatic.  Eyes:     General: No scleral icterus. Cardiovascular:     Rate and Rhythm: Normal rate and regular rhythm.     Heart sounds: Normal heart sounds.  Pulmonary:     Effort: Pulmonary effort is normal. No respiratory distress.     Breath sounds: No wheezing.     Comments: Decreased breath sound bilaterally. Abdominal:     General: Bowel sounds are normal. There is no distension.     Palpations: Abdomen is soft.  Musculoskeletal:        General: No deformity. Normal range of motion.     Cervical back: Normal range of motion and neck supple.     Comments: Bilateral ankle trace edema.  Skin:    General: Skin is warm and dry.     Findings: No rash.  Neurological:     Mental Status: He is alert. Mental status is at baseline.     Cranial Nerves: No cranial nerve deficit.     Comments: Chronic left-sided weakness  Psychiatric:        Mood and Affect: Mood normal.     LABORATORY DATA:  I have reviewed the data as listed    Latest Ref Rng & Units 12/22/2022    1:03 PM 12/01/2022    1:02 PM 10/20/2022    1:55 PM  CBC   WBC 4.0 - 10.5 K/uL 7.1  5.9  6.3   Hemoglobin 13.0 - 17.0 g/dL 95.6  21.3  08.6   Hematocrit 39.0 - 52.0 % 38.7  33.8  34.2   Platelets 150 - 400 K/uL 279  261  228       Latest Ref Rng & Units 12/22/2022    1:03 PM 12/01/2022    1:02 PM 10/20/2022    1:55 PM  CMP  Glucose 70 - 99 mg/dL 82  578  469   BUN 8 - 23 mg/dL 21  21  20    Creatinine 0.61 - 1.24 mg/dL 6.29  5.28  4.13   Sodium 135 - 145 mmol/L 132  134  133   Potassium 3.5 - 5.1 mmol/L 4.0  3.5  3.9   Chloride 98 - 111 mmol/L 101  104  104   CO2 22 - 32 mmol/L 23  23  22    Calcium 8.9 - 10.3 mg/dL 8.7  8.8  8.7   Total Protein 6.5 - 8.1 g/dL 7.0  6.9  7.1   Total Bilirubin 0.3 - 1.2 mg/dL 0.3  0.5  0.5   Alkaline Phos 38 - 126 U/L 64  56  69   AST 15 - 41 U/L 19  14  18    ALT 0 - 44 U/L 11  10  13       Iron/TIBC/Ferritin/ %  Sat    Component Value Date/Time   IRON 60 02/03/2022 0958   TIBC 391 02/03/2022 0958   FERRITIN 704 (H) 02/03/2022 0958   IRONPCTSAT 15 (L) 02/03/2022 1610      RADIOGRAPHIC STUDIES: I have personally reviewed the radiological images as listed and agreed with the findings in the report. No results found.

## 2022-12-22 NOTE — Assessment & Plan Note (Signed)
Acute on chronic. Currently on Keppra for presumed seizure. Follow-up with neurology.  Pending evaluation by neurosurgeon.

## 2022-12-22 NOTE — Assessment & Plan Note (Signed)
Hold off Eliquis per neurology Dr. Barbaraann Cao 10/02/22 Repeat MRI brain-stable findings

## 2022-12-22 NOTE — Assessment & Plan Note (Signed)
Back pain, MRI lumbar showed DJD, L4-L5 synovial cyst,PET scan showed L4 fracture Continue PRN percocet 7.5mg /325mg , fentanyl patch Q72 hours

## 2022-12-22 NOTE — Patient Instructions (Signed)
[facility]  Discharge Instructions: Thank you for choosing [facility] to provide your oncology and hematology care.  If you have a lab appointment with the Cancer Center, please go directly to the Cancer Center and check in at the registration area.  Wear comfortable clothing and clothing appropriate for easy access to any Portacath or PICC line.   We strive to give you quality time with your provider. You may need to reschedule your appointment if you arrive late (15 or more minutes).  Arriving late affects you and other patients whose appointments are after yours.  Also, if you miss three or more appointments without notifying the office, you may be dismissed from the clinic at the provider's discretion.      For prescription refill requests, have your pharmacy contact our office and allow 72 hours for refills to be completed.    Today you received the following chemotherapy and/or immunotherapy agents- Keytruda      To help prevent nausea and vomiting after your treatment, we encourage you to take your nausea medication as directed.  BELOW ARE SYMPTOMS THAT SHOULD BE REPORTED IMMEDIATELY: *FEVER GREATER THAN 100.4 F (38 C) OR HIGHER *CHILLS OR SWEATING *NAUSEA AND VOMITING THAT IS NOT CONTROLLED WITH YOUR NAUSEA MEDICATION *UNUSUAL SHORTNESS OF BREATH *UNUSUAL BRUISING OR BLEEDING *URINARY PROBLEMS (pain or burning when urinating, or frequent urination) *BOWEL PROBLEMS (unusual diarrhea, constipation, pain near the anus) TENDERNESS IN MOUTH AND THROAT WITH OR WITHOUT PRESENCE OF ULCERS (sore throat, sores in mouth, or a toothache) UNUSUAL RASH, SWELLING OR PAIN  UNUSUAL VAGINAL DISCHARGE OR ITCHING   Items with * indicate a potential emergency and should be followed up as soon as possible or go to the Emergency Department if any problems should occur.  Please show the CHEMOTHERAPY ALERT CARD or IMMUNOTHERAPY ALERT CARD at check-in to  the Emergency Department and triage nurse.  Should you have questions after your visit or need to cancel or reschedule your appointment, please contact [facility]  [phone] and follow the prompts.  Office hours are 8:00 a.m. to 4:30 p.m. Monday - Friday. Please note that voicemails left after 4:00 p.m. may not be returned until the following business day.  We are closed weekends and major holidays. You have access to a nurse at all times for urgent questions. Please call the main number to the clinic [phone] and follow the prompts.  For any non-urgent questions, you may also contact your provider using MyChart. We now offer e-Visits for anyone 18 and older to request care online for non-urgent symptoms. For details visit [email or web address].   Also download the MyChart app! Go to the app store, search "MyChart", open the app, select [facility], and log in with your MyChart username and password.   

## 2022-12-22 NOTE — Assessment & Plan Note (Signed)
Off Eliquis 2.5mg  BID due to concern of microbleeds in brain

## 2022-12-22 NOTE — Progress Notes (Signed)
Pt here for follow up. Pt reports that he is currently on antibiotic for UTI.

## 2022-12-22 NOTE — Assessment & Plan Note (Addendum)
Recommend Zometa every 4- 6 weeks.-  hold off today due to possible upcoming surgery Continue calcium supplementation.  MRI lumbar with and without contrast showed  Stable remote superior endplate fracture of L2 and large Schmorl to noted involving L5. 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 PET scan shows stable bone lesions.  L4 fracture uptake likely benign- s/p nerve block - pending neurosurgery evaluation.  Hold off Zometa given possibility of upcoming back surgery.

## 2022-12-22 NOTE — Assessment & Plan Note (Addendum)
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  S/p lung radiation to left lung.  Labs are reviewed and discussed with patient. Proceed with Keytruda  12/19/2022, CT chest pending official read

## 2022-12-22 NOTE — Assessment & Plan Note (Signed)
Immunotherapy plan as listed above 

## 2022-12-23 ENCOUNTER — Other Ambulatory Visit: Payer: Self-pay | Admitting: Hospice and Palliative Medicine

## 2022-12-23 DIAGNOSIS — G893 Neoplasm related pain (acute) (chronic): Secondary | ICD-10-CM

## 2022-12-23 DIAGNOSIS — C3412 Malignant neoplasm of upper lobe, left bronchus or lung: Secondary | ICD-10-CM

## 2022-12-23 MED ORDER — FENTANYL 75 MCG/HR TD PT72: 1.0000 | MEDICATED_PATCH | TRANSDERMAL | 0 refills | Status: DC

## 2022-12-25 ENCOUNTER — Ambulatory Visit
Admission: RE | Admit: 2022-12-25 | Discharge: 2022-12-25 | Disposition: A | Discharge status: Home / Self Care | Payer: PPO | Source: Ambulatory Visit | Attending: Radiation Oncology | Admitting: Radiation Oncology

## 2022-12-25 ENCOUNTER — Other Ambulatory Visit: Payer: Self-pay | Admitting: *Deleted

## 2022-12-25 ENCOUNTER — Encounter: Payer: Self-pay | Admitting: Radiation Oncology

## 2022-12-25 VITALS — BP 133/83 | HR 71 | Temp 97.6°F | Resp 12

## 2022-12-25 DIAGNOSIS — C3412 Malignant neoplasm of upper lobe, left bronchus or lung: Secondary | ICD-10-CM

## 2022-12-25 DIAGNOSIS — M549 Dorsalgia, unspecified: Secondary | ICD-10-CM | POA: Diagnosis not present

## 2022-12-25 DIAGNOSIS — Z923 Personal history of irradiation: Secondary | ICD-10-CM | POA: Diagnosis not present

## 2022-12-25 NOTE — Progress Notes (Signed)
Radiation Oncology Follow up Note  Name: Micheal Donovan   Date:   12/25/2022 MRN:  [id number] DOB: 13-Jan-1951    This 72 y.o. male presents for month follow-up status post SBRT to his left lung and patient with known stage IV adenocarcinoma the lung  REFERRING PROVIDER: Donita Brooks, MD  HPI: Patient is a 72 year old male now seen after 4 months having completed SBRT to his left lung and patient with known stage IV adenocarcinoma lung.  He continues to have back pain he is being evaluated by Dr. Marcell Barlow I believe next week.  He has been declined for surgery in the past..  He is had an MRI scan back in August showing no definitive evidence of metastatic disease on his lumbar spine.  He does have L2 compression fracture as well as large Schmorl's node.  He specifically [place] cough hemoptysis or chest tightness.  CT scan from yesterday which I reviewed shows stable appearance of treated left lung lesion of the left upper lobe with surrounding scarring and architectural distortion.  There was an FDG DG avid 7 mm nodule abutting the oblique fissure which is stable from previous scan.  He is currently on Keytruda tolerating that well.  COMPLICATIONS OF TREATMENT: none  FOLLOW UP COMPLIANCE: keeps appointments   PHYSICAL EXAM:  BP 133/83   Pulse 71   Temp 97.6 F (36.4 C) (Tympanic)   Resp 12  Frail-appearing wheelchair-bound male in NAD.  Well-developed well-nourished patient in NAD. HEENT reveals PERLA, EOMI, discs not visualized.  Oral cavity is clear. No oral mucosal lesions are identified. Neck is clear without evidence of cervical or supraclavicular adenopathy. Lungs are clear to A&P. Cardiac examination is essentially unremarkable with regular rate and rhythm without murmur rub or thrill. Abdomen is benign with no organomegaly or masses noted. Motor sensory and DTR levels are equal and symmetric in the upper and lower extremities. Cranial nerves II through XII are grossly intact.  Proprioception is intact. No peripheral adenopathy or edema is identified. No motor or sensory levels are noted. Crude visual fields are within normal range.  RADIOLOGY RESULTS: MRI of his lumbar spine and CT scans reviewed compatible with above-stated findings  PLAN: I do not think there is definitive evidence he has metastatic disease in his lower back although await Dr. Osborne Oman evaluation.  At this time patient is current on immunotherapy with Keytruda tolerating well chest is stable.  Open return follow-up care over to medical oncology.  To be happy to reevaluate the patient anytime should further palliative treatment be indicated.  I would like to take this opportunity to thank you for allowing me to participate in the care of your patient.[name], MD

## 2022-12-26 NOTE — Progress Notes (Unsigned)
Referring Physician:  Henreitta Leber, MD [address] [place],  [place] [address]  Primary Physician:  Donita Brooks, MD  History of Present Illness: 12/30/2022 [name] is here today with a chief complaint of weakness in his left leg worse than his right leg.  This began a couple of years ago.  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  Since that time, he has had substantial weakness.  He is not the point where he has difficulty walking.  He can only walk with assistance.  He has not walked without assistance for approximately 4 months.  He reports losing significant muscle bulk.  He was diagnosed with lung cancer last year and underwent chemotherapy and radiation treatment.  His disease is currently stable.  He is also suffering from back pain, but that has been ongoing for many years.  He denies any shooting pains down his left leg.  He does have shooting pains in his back.  He has been referred today for evaluation.  He has been seen at an outside pain management site, where he has is being worked up for possible spinal cord stimulator placement.  Bowel/Bladder Dysfunction: none  Conservative measures:  Physical therapy: Had PT from home health. They were trying to strengthen the muscles but had to stop when he moved to wheelchair full time.   Multimodal medical therapy including regular antiinflammatories: Gabapentin, Tylenol,Oxycodone, Fentanyl  Injections: did receive ESI from Dr. Ethelene Hal at [facility]. Also has an ablation in May  Past Surgery: none  Micheal Donovan has no symptoms of cervical myelopathy.  The symptoms are causing a significant impact on the patient's life.   I have utilized the care everywhere function in epic to review the outside records available from external health systems.  Review of Systems:  A 10 point review of systems is negative, except for the pertinent positives and negatives detailed in the  HPI.  Past Medical History: Past Medical History:  Diagnosis Date   Allergy    BPH (benign prostatic hypertrophy)    Cancer (HCC)    Melanoma on Neck    2008   Carotid artery occlusion    CHF (congestive heart failure) (HCC)    Diabetes mellitus    type 2   ED (erectile dysfunction)    GERD (gastroesophageal reflux disease)    Hemorrhagic stroke (HCC) 06/2021   Hyperlipidemia    Hypertension    Lung cancer (HCC)    Neoplasm related pain    Retinopathy due to secondary DM [facility])     Past Surgical History: Past Surgical History:  Procedure Laterality Date   BRONCHIAL NEEDLE ASPIRATION BIOPSY  06/17/2021   Procedure: BRONCHIAL NEEDLE ASPIRATION BIOPSIES;  Surgeon: Lorin Glass, MD;  Location: [facility] ENDOSCOPY;  Service: Pulmonary;;   IR IMAGING GUIDED PORT INSERTION  08/05/2021   MELANOMA EXCISION  2008   Left side of neck   RADIOLOGY WITH ANESTHESIA N/A 04/05/2020   Procedure: MRI SPINE WITOUT CONTRAST;  Surgeon: Radiologist, Medication, MD;  Location: [facility] OR;  Service: Radiology;  Laterality: N/A;   RADIOLOGY WITH ANESTHESIA N/A 08/22/2021   Procedure: MRI BRAIN WITH AND WITHOUT CONTRAST  WITH ANESTHESIA;  Surgeon: Radiologist, Medication, MD;  Location: [facility] OR;  Service: Radiology;  Laterality: N/A;   RADIOLOGY WITH ANESTHESIA N/A 12/17/2021   Procedure: MRI BRAIN WITH AND WITHOUT CONTRAST WITH ANESTHESIA; MRI LUMBER WITH AND WITHOUT CONTRAST;  Surgeon: Radiologist, Medication, MD;  Location: [facility] OR;  Service: Radiology;  Laterality: N/A;   RADIOLOGY WITH ANESTHESIA N/A 04/08/2022   Procedure: MRI LUMBER SPINE WITH AND WITHOUT CONTRAST;  Surgeon: Radiologist, Medication, MD;  Location: [facility] OR;  Service: Radiology;  Laterality: N/A;   TONSILLECTOMY     VIDEO BRONCHOSCOPY WITH ENDOBRONCHIAL ULTRASOUND N/A 06/17/2021   Procedure: VIDEO BRONCHOSCOPY WITH ENDOBRONCHIAL ULTRASOUND;  Surgeon: Lorin Glass, MD;  Location: [facility] ENDOSCOPY;  Service: Pulmonary;  Laterality: N/A;     Allergies: Allergies as of 12/30/2022 - Review Complete 12/30/2022  Allergen Reaction Noted   Niaspan [niacin]  10/28/2011    Medications:  Current Outpatient Medications:    citalopram (CELEXA) 10 MG tablet, Take 1 tablet (10 mg total) by mouth daily., Disp: 30 tablet, Rfl: 3   docusate sodium (COLACE) 100 MG capsule, Take 100 mg by mouth 2 (two) times daily as needed for mild constipation., Disp: , Rfl:    fentaNYL (DURAGESIC) 75 MCG/HR, Place 1 patch onto the skin every 3 (three) days., Disp: 10 patch, Rfl: 0   finasteride (PROSCAR) 5 MG tablet, Take 1 tablet (5 mg total) by mouth daily., Disp: 30 tablet, Rfl: 0   gabapentin (NEURONTIN) 300 MG capsule, Take 2 capsules (600 mg total) by mouth 2 (two) times daily., Disp: 120 capsule, Rfl: 2   levETIRAcetam (KEPPRA) 500 MG tablet, Take 1 tablet (500 mg total) by mouth 2 (two) times daily., Disp: 60 tablet, Rfl: 5   megestrol (MEGACE) 40 MG tablet, Take 1 tablet (40 mg total) by mouth daily., Disp: 30 tablet, Rfl: 3   NOVOLOG FLEXPEN 100 UNIT/ML FlexPen, Inject 0-10 Units into the skin daily as needed for high blood sugar (BG > 200)., Disp: , Rfl:    Omeprazole 20 MG TBEC, Take 20 mg by mouth daily., Disp: , Rfl:    oxyCODONE-acetaminophen (PERCOCET) 7.5-325 MG tablet, Take 1 tablet by mouth every 4 (four) hours as needed for severe pain. Do not take with cough medication or other pain medication, Disp: 60 tablet, Rfl: 0   valsartan (DIOVAN) 80 MG tablet, Take 80 mg by mouth daily., Disp: , Rfl:    ALPRAZolam (XANAX) 0.5 MG tablet, Take 1 tablet (0.5 mg total) by mouth at bedtime as needed for anxiety. (Patient not taking: Reported on 12/30/2022), Disp: 15 tablet, Rfl: 0 No current facility-administered medications for this visit.  Facility-Administered Medications Ordered in Other Visits:    heparin lock flush 100 UNIT/ML injection, , , ,   Social History: Social History   Tobacco Use   Smoking status: Former    Current  packs/day: 0.00    Types: Cigarettes    Quit date: 07/21/1990    Years since quitting: 32.4   Smokeless tobacco: Never  Vaping Use   Vaping status: Never Used  Substance Use Topics   Alcohol use: No   Drug use: No    Family Medical History: Family History  Problem Relation Age of Onset   COPD Mother    Heart disease Father    Heart disease Brother        MI at age 39   Stroke Neg Hx     Physical Examination: Vitals:   12/30/22 1411  BP: 108/74    General: Patient is in no apparent distress. Attention to examination is appropriate.  Neck:   Supple.  Full range of motion.  Respiratory: Patient is breathing without any difficulty.   NEUROLOGICAL:     Awake, alert, oriented to person, place, and time.  Speech is clear and  fluent.   Cranial Nerves: Pupils equal round and reactive to light.  Facial tone is symmetric.  Facial sensation is symmetric. Shoulder shrug is symmetric. Tongue protrusion is midline.  There is no pronator drift.  Strength: Side Biceps Triceps Deltoid Interossei Grip Wrist Ext. Wrist Flex.  R 5 5 5 5 5 5 5   L 5 5 5 5 5 5 5    Side Iliopsoas Quads Hamstring PF DF EHL  R 4+ 5 5 5 5 5   L 4- 4 4+ 4+ 4+ 4+   Reflexes are 2+ in his right patellar reflex and 0 in his left patellar reflex.  His Achilles reflexes are 1+.  His upper extremity reflexes are 2+ and symmetric.  Hoffman's is absent.   Bilateral upper and lower extremity sensation is intact to light touch.    No evidence of dysmetria noted.  Gait is untested-cannot walk.    He has significant muscle loss in both of his lower extremities.   Medical Decision Making  Imaging: MRI L spine 10/02/2022 Disc levels:   L1-2: Disc desiccation with mild disc bulge. Mild facet spurring. No spinal stenosis. Foramina remain patent.   L2-3: Disc desiccation with mild disc bulge. No spinal stenosis. Foramina remain patent.   L3-4: Degenerative intervertebral disc space narrowing with diffuse disc  bulge. Associated reactive endplate spurring. Mild bilateral facet hypertrophy. Resultant mild left greater than right lateral recess stenosis. Central canal remains patent. Mild to moderate left with mild right L3 foraminal stenosis.   L4-5: Mild disc bulge with reactive endplate spurring. Mild to moderate facet hypertrophy. Resultant mild to moderate bilateral subarticular stenosis. Central canal remains patent. Mild to moderate left with moderate right L4 foraminal stenosis.   L5-S1: Retrolisthesis with degenerative intervertebral disc space narrowing. Central disc protrusion with slight inferior angulation and annular fissure indents the ventral thecal sac (series 12, image 33). Mild bilateral facet hypertrophy. Resultant mild bilateral subarticular stenosis. Central canal remains patent. Moderate bilateral L5 foraminal narrowing.   IMPRESSION: 1. No MRI evidence for acute infection or other abnormality within the lumbar spine. 2. Few subcentimeter T1 hypointense lesions about the visualized posterior iliac wings. These correspond with small sclerotic foci seen on most recent CT from 04/08/2022, presumably reflecting patient's known osseous metastatic disease. These were not hypermetabolic on prior PET-CT from 21/30/8657. 3. No other evidence for metastatic disease within the lumbar spine itself. 4. Chronic L2 compression fracture, stable. Large Schmorl's node deformity with associated height loss at L5, also stable. 5. Multifactorial degenerative changes at L3-4 through L5-S1 with resultant mild to moderate bilateral subarticular stenosis, with mild to moderate bilateral L3 through L5 foraminal narrowing as above.     Electronically Signed   By: Rise Mu M.D.   On: 10/02/2022 05:22  I have personally reviewed the images and agree with the above interpretation.  Assessment and Plan: Mr. Sorden is a pleasant 72 y.o. male with unexplained weakness in his left  greater than right lower extremity.  He does not have any significant radicular pain.  He does have areas of neuroforaminal narrowing.  It is very rare to see weakness mediated by neuroforaminal narrowing without any sensory component.  However, he does have peripheral neuropathy which may cloud the diagnosis.  He is getting a thoracic spine MRI scan tomorrow.  His symptoms could be explained by thoracic myelopathy in the setting of severe neuropathy.  I am interested to see what his MRI scan shows.  I recommended a nerve conduction study to  evaluate for any active radicular component.  If there is not active radicular component that could explain his weakness, I would be hesitant to consider any surgical intervention as I think the potential benefit would be low.  Independent of the above, he may be a candidate for spinal cord stimulator placement to help with his back pain.  If he has a positive trial, I will be happy to place a permanent device.  We will follow-up after his nerve conduction study unless there is something compressive on his thoracic MRI scan.  I spent a total of 30 minutes in this patient's care today. This time was spent reviewing pertinent records including imaging studies, obtaining and confirming history, performing a directed evaluation, formulating and discussing my recommendations, and documenting the visit within the medical record.   Thank you for involving me in the care of this patient.      Katori Wirsing K. Myer Haff MD, [facility] Neurosurgery

## 2022-12-30 ENCOUNTER — Encounter: Payer: Self-pay | Admitting: Neurosurgery

## 2022-12-30 ENCOUNTER — Telehealth: Payer: Self-pay | Admitting: Family Medicine

## 2022-12-30 ENCOUNTER — Ambulatory Visit: Payer: PPO | Admitting: Neurosurgery

## 2022-12-30 VITALS — BP 108/74 | Ht 67.0 in | Wt 121.0 lb

## 2022-12-30 DIAGNOSIS — G629 Polyneuropathy, unspecified: Secondary | ICD-10-CM | POA: Diagnosis not present

## 2022-12-30 DIAGNOSIS — R29898 Other symptoms and signs involving the musculoskeletal system: Secondary | ICD-10-CM

## 2022-12-30 NOTE — Telephone Encounter (Signed)
Patient is having a MRI tomorrow at [facility]. We will need to get these images and report after.

## 2022-12-31 DIAGNOSIS — M4724 Other spondylosis with radiculopathy, thoracic region: Secondary | ICD-10-CM | POA: Diagnosis not present

## 2023-01-01 NOTE — Telephone Encounter (Signed)
Still waiting on images from [facility]

## 2023-01-01 NOTE — Telephone Encounter (Signed)
I have sent a powershare request for the Thoracic MRI to [facility].   The report is in care everywhere.

## 2023-01-02 ENCOUNTER — Other Ambulatory Visit: Payer: Self-pay

## 2023-01-02 ENCOUNTER — Inpatient Hospital Stay
Admission: RE | Admit: 2023-01-02 | Discharge: 2023-01-02 | Disposition: A | Discharge status: Home / Self Care | Payer: Self-pay | Source: Ambulatory Visit | Attending: Neurosurgery | Admitting: Neurosurgery

## 2023-01-02 DIAGNOSIS — Z049 Encounter for examination and observation for unspecified reason: Secondary | ICD-10-CM

## 2023-01-02 NOTE — Telephone Encounter (Signed)
Still waiting on images

## 2023-01-02 NOTE — Telephone Encounter (Signed)
Images of Thoracic MRI are now in the patients chart and the report is in care everywhere.

## 2023-01-04 ENCOUNTER — Telehealth: Payer: Self-pay | Admitting: Neurosurgery

## 2023-01-04 DIAGNOSIS — M4802 Spinal stenosis, cervical region: Secondary | ICD-10-CM

## 2023-01-04 DIAGNOSIS — G959 Disease of spinal cord, unspecified: Secondary | ICD-10-CM

## 2023-01-04 NOTE — Telephone Encounter (Signed)
Spoke with [name].  His T spine MRI does not explain his weakness.  In the counting sagittal, there is an area of likely severe stenosis that may be causing his imbalance.  He has some symptoms of cervical myelopathy.  We will arrange for C spine MRI urgently

## 2023-01-05 ENCOUNTER — Other Ambulatory Visit: Payer: Self-pay | Admitting: Neurosurgery

## 2023-01-05 DIAGNOSIS — M4802 Spinal stenosis, cervical region: Secondary | ICD-10-CM

## 2023-01-05 DIAGNOSIS — G959 Disease of spinal cord, unspecified: Secondary | ICD-10-CM

## 2023-01-05 NOTE — Telephone Encounter (Signed)
Noted. I will send a message to [facility] scheduling.

## 2023-01-06 NOTE — Addendum Note (Signed)
Addended by: Ernie Hew on: 01/06/2023 09:11 AM   Modules accepted: Orders

## 2023-01-09 ENCOUNTER — Ambulatory Visit
Admission: RE | Admit: 2023-01-09 | Discharge: 2023-01-09 | Disposition: A | Discharge status: Home / Self Care | Payer: PPO | Source: Ambulatory Visit | Attending: Neurosurgery | Admitting: Neurosurgery

## 2023-01-09 DIAGNOSIS — M4802 Spinal stenosis, cervical region: Secondary | ICD-10-CM

## 2023-01-09 DIAGNOSIS — M542 Cervicalgia: Secondary | ICD-10-CM | POA: Diagnosis not present

## 2023-01-09 DIAGNOSIS — G959 Disease of spinal cord, unspecified: Secondary | ICD-10-CM

## 2023-01-09 DIAGNOSIS — Z981 Arthrodesis status: Secondary | ICD-10-CM | POA: Diagnosis not present

## 2023-01-12 ENCOUNTER — Inpatient Hospital Stay: Payer: PPO

## 2023-01-12 ENCOUNTER — Inpatient Hospital Stay: Payer: PPO | Attending: Radiation Oncology

## 2023-01-12 ENCOUNTER — Inpatient Hospital Stay (HOSPITAL_BASED_OUTPATIENT_CLINIC_OR_DEPARTMENT_OTHER): Payer: PPO | Admitting: Oncology

## 2023-01-12 ENCOUNTER — Encounter: Payer: Self-pay | Admitting: Oncology

## 2023-01-12 VITALS — BP 114/47 | HR 63 | Resp 16

## 2023-01-12 VITALS — BP 127/66 | HR 67 | Temp 96.6°F | Resp 18 | Wt 136.3 lb

## 2023-01-12 DIAGNOSIS — N400 Enlarged prostate without lower urinary tract symptoms: Secondary | ICD-10-CM | POA: Insufficient documentation

## 2023-01-12 DIAGNOSIS — E785 Hyperlipidemia, unspecified: Secondary | ICD-10-CM | POA: Insufficient documentation

## 2023-01-12 DIAGNOSIS — C7931 Secondary malignant neoplasm of brain: Secondary | ICD-10-CM | POA: Insufficient documentation

## 2023-01-12 DIAGNOSIS — C3412 Malignant neoplasm of upper lobe, left bronchus or lung: Secondary | ICD-10-CM

## 2023-01-12 DIAGNOSIS — M47812 Spondylosis without myelopathy or radiculopathy, cervical region: Secondary | ICD-10-CM | POA: Insufficient documentation

## 2023-01-12 DIAGNOSIS — G893 Neoplasm related pain (acute) (chronic): Secondary | ICD-10-CM | POA: Diagnosis not present

## 2023-01-12 DIAGNOSIS — J341 Cyst and mucocele of nose and nasal sinus: Secondary | ICD-10-CM | POA: Insufficient documentation

## 2023-01-12 DIAGNOSIS — Z85820 Personal history of malignant melanoma of skin: Secondary | ICD-10-CM | POA: Diagnosis not present

## 2023-01-12 DIAGNOSIS — M549 Dorsalgia, unspecified: Secondary | ICD-10-CM | POA: Diagnosis not present

## 2023-01-12 DIAGNOSIS — I2699 Other pulmonary embolism without acute cor pulmonale: Secondary | ICD-10-CM | POA: Diagnosis not present

## 2023-01-12 DIAGNOSIS — Z8673 Personal history of transient ischemic attack (TIA), and cerebral infarction without residual deficits: Secondary | ICD-10-CM | POA: Diagnosis not present

## 2023-01-12 DIAGNOSIS — G8929 Other chronic pain: Secondary | ICD-10-CM

## 2023-01-12 DIAGNOSIS — I509 Heart failure, unspecified: Secondary | ICD-10-CM | POA: Insufficient documentation

## 2023-01-12 DIAGNOSIS — M48061 Spinal stenosis, lumbar region without neurogenic claudication: Secondary | ICD-10-CM | POA: Insufficient documentation

## 2023-01-12 DIAGNOSIS — I619 Nontraumatic intracerebral hemorrhage, unspecified: Secondary | ICD-10-CM | POA: Diagnosis not present

## 2023-01-12 DIAGNOSIS — Z794 Long term (current) use of insulin: Secondary | ICD-10-CM | POA: Insufficient documentation

## 2023-01-12 DIAGNOSIS — Z79899 Other long term (current) drug therapy: Secondary | ICD-10-CM | POA: Diagnosis not present

## 2023-01-12 DIAGNOSIS — Z87891 Personal history of nicotine dependence: Secondary | ICD-10-CM | POA: Diagnosis not present

## 2023-01-12 DIAGNOSIS — Z5112 Encounter for antineoplastic immunotherapy: Secondary | ICD-10-CM | POA: Insufficient documentation

## 2023-01-12 DIAGNOSIS — K219 Gastro-esophageal reflux disease without esophagitis: Secondary | ICD-10-CM | POA: Insufficient documentation

## 2023-01-12 DIAGNOSIS — I700 Atherosclerosis of aorta: Secondary | ICD-10-CM | POA: Diagnosis not present

## 2023-01-12 DIAGNOSIS — I110 Hypertensive heart disease with heart failure: Secondary | ICD-10-CM | POA: Diagnosis not present

## 2023-01-12 DIAGNOSIS — C7951 Secondary malignant neoplasm of bone: Secondary | ICD-10-CM

## 2023-01-12 DIAGNOSIS — I2510 Atherosclerotic heart disease of native coronary artery without angina pectoris: Secondary | ICD-10-CM | POA: Diagnosis not present

## 2023-01-12 DIAGNOSIS — M4802 Spinal stenosis, cervical region: Secondary | ICD-10-CM | POA: Insufficient documentation

## 2023-01-12 DIAGNOSIS — E11319 Type 2 diabetes mellitus with unspecified diabetic retinopathy without macular edema: Secondary | ICD-10-CM | POA: Diagnosis not present

## 2023-01-12 LAB — COMPREHENSIVE METABOLIC PANEL
ALT: 9 U/L (ref 0–44)
AST: 15 U/L (ref 15–41)
Albumin: 3.7 g/dL (ref 3.5–5.0)
Alkaline Phosphatase: 60 U/L (ref 38–126)
Anion gap: 7 (ref 5–15)
BUN: 15 mg/dL (ref 8–23)
CO2: 26 mmol/L (ref 22–32)
Calcium: 8.7 mg/dL — ABNORMAL LOW (ref 8.9–10.3)
Chloride: 102 mmol/L (ref 98–111)
Creatinine, Ser: 0.78 mg/dL (ref 0.61–1.24)
GFR, Estimated: >60 mL/min (ref >60)
Glucose, Bld: 109 mg/dL — ABNORMAL HIGH (ref 70–99)
Potassium: 4.1 mmol/L (ref 3.5–5.1)
Sodium: 135 mmol/L (ref 135–145)
Total Bilirubin: 0.3 mg/dL (ref <1.2)
Total Protein: 6.5 g/dL (ref 6.5–8.1)

## 2023-01-12 LAB — CBC WITH DIFFERENTIAL/PLATELET
Abs Immature Granulocytes: 0.05 10*3/uL (ref 0.00–0.07)
Basophils Absolute: 0 10*3/uL (ref 0.0–0.1)
Basophils Relative: 1 %
Eosinophils Absolute: 0.5 10*3/uL (ref 0.0–0.5)
Eosinophils Relative: 8 %
HCT: 35 % — ABNORMAL LOW (ref 39.0–52.0)
Hemoglobin: 11.3 g/dL — ABNORMAL LOW (ref 13.0–17.0)
Immature Granulocytes: 1 %
Lymphocytes Relative: 13 %
Lymphs Abs: 0.8 10*3/uL (ref 0.7–4.0)
MCH: 30.4 pg (ref 26.0–34.0)
MCHC: 32.3 g/dL (ref 30.0–36.0)
MCV: 94.1 fL (ref 80.0–100.0)
Monocytes Absolute: 0.5 10*3/uL (ref 0.1–1.0)
Monocytes Relative: 8 %
Neutro Abs: 4 10*3/uL (ref 1.7–7.7)
Neutrophils Relative %: 69 %
Platelets: 257 10*3/uL (ref 150–400)
RBC: 3.72 MIL/uL — ABNORMAL LOW (ref 4.22–5.81)
RDW: 13.7 % (ref 11.5–15.5)
WBC: 5.8 10*3/uL (ref 4.0–10.5)
nRBC: 0 % (ref 0.0–0.2)

## 2023-01-12 LAB — TSH: TSH: 1.757 u[IU]/mL (ref 0.350–4.500)

## 2023-01-12 MED ORDER — SODIUM CHLORIDE 0.9 % IV SOLN
200.0000 mg | Freq: Once | INTRAVENOUS | Status: AC
  Administered 2023-01-12: 200 mg via INTRAVENOUS
  Filled 2023-01-12: qty 200

## 2023-01-12 MED ORDER — SODIUM CHLORIDE 0.9 % IV SOLN
Freq: Once | INTRAVENOUS | Status: AC
  Filled 2023-01-12: qty 250

## 2023-01-12 MED ORDER — HEPARIN SOD (PORK) LOCK FLUSH 100 UNIT/ML IV SOLN
500.0000 [IU] | Freq: Once | INTRAVENOUS | Status: AC | PRN
Start: 2023-01-12 — End: 2023-01-12
  Administered 2023-01-12: 500 [IU]
  Filled 2023-01-12: qty 5

## 2023-01-12 NOTE — Assessment & Plan Note (Signed)
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  S/p lung radiation to left lung.  12/19/2022, CT chest stable disease.  Labs are reviewed and discussed with patient. Proceed with [name]

## 2023-01-12 NOTE — Patient Instructions (Signed)
 [facility]  Discharge Instructions: Thank you for choosing [facility] to provide your oncology and hematology care.  If you have a lab appointment with the Cancer Center, please go directly to the Cancer Center and check in at the registration area.  Wear comfortable clothing and clothing appropriate for easy access to any Portacath or PICC line.   We strive to give you quality time with your provider. You may need to reschedule your appointment if you arrive late (15 or more minutes).  Arriving late affects you and other patients whose appointments are after yours.  Also, if you miss three or more appointments without notifying the office, you may be dismissed from the clinic at the provider's discretion.      For prescription refill requests, have your pharmacy contact our office and allow 72 hours for refills to be completed.    Today you received the following chemotherapy and/or immunotherapy agents Keytruda      To help prevent nausea and vomiting after your treatment, we encourage you to take your nausea medication as directed.  BELOW ARE SYMPTOMS THAT SHOULD BE REPORTED IMMEDIATELY: *FEVER GREATER THAN 100.4 F (38 C) OR HIGHER *CHILLS OR SWEATING *NAUSEA AND VOMITING THAT IS NOT CONTROLLED WITH YOUR NAUSEA MEDICATION *UNUSUAL SHORTNESS OF BREATH *UNUSUAL BRUISING OR BLEEDING *URINARY PROBLEMS (pain or burning when urinating, or frequent urination) *BOWEL PROBLEMS (unusual diarrhea, constipation, pain near the anus) TENDERNESS IN MOUTH AND THROAT WITH OR WITHOUT PRESENCE OF ULCERS (sore throat, sores in mouth, or a toothache) UNUSUAL RASH, SWELLING OR PAIN  UNUSUAL VAGINAL DISCHARGE OR ITCHING   Items with * indicate a potential emergency and should be followed up as soon as possible or go to the Emergency Department if any problems should occur.  Please show the CHEMOTHERAPY ALERT CARD or IMMUNOTHERAPY  ALERT CARD at check-in to the Emergency Department and triage nurse.  Should you have questions after your visit or need to cancel or reschedule your appointment, please contact [facility] - A DEPT OF [facility]  [phone] and follow the prompts.  Office hours are 8:00 a.m. to 4:30 p.m. Monday - Friday. Please note that voicemails left after 4:00 p.m. may not be returned until the following business day.  We are closed weekends and major holidays. You have access to a nurse at all times for urgent questions. Please call the main number to the clinic [phone] and follow the prompts.  For any non-urgent questions, you may also contact your provider using MyChart. We now offer e-Visits for anyone 83 and older to request care online for non-urgent symptoms. For details visit [email or web address].   Also download the MyChart app! Go to the app store, search "MyChart", open the app, select , and log in with your MyChart username and password.

## 2023-01-12 NOTE — Progress Notes (Signed)
Hematology/Oncology Progress note [phone] [phone]       CHIEF COMPLAINTS/REASON FOR VISIT:  Metastatic non-small cell lung cancer  ASSESSMENT & PLAN:   Cancer Staging  Primary non-small cell carcinoma of upper lobe of left lung (HCC) Staging form: Lung, AJCC 8th Edition - Clinical: Stage IV (cT2b, cN1, cM1) - Signed by [name], MD on 08/06/2021   Primary non-small cell carcinoma of upper lobe of left lung (HCC) Stage IV lung adenocarcinoma with brain and bone metastasis.  S/p lung radiation to left lung.  12/19/2022, CT chest stable disease.  Labs are reviewed and discussed with patient. Proceed with Keytruda     Intraparenchymal hemorrhage of brain (HCC) Hold off Eliquis per neurology Dr. Barbaraann Cao 10/02/22 Repeat MRI brain-stable findings   Pulmonary embolus (HCC) Off Eliquis 2.5mg  BID due to concern of microbleeds in brain  Metastasis to bone (HCC) Recommend Zometa every 4- 6 weeks.-  hold off today due to possible upcoming surgery Continue calcium supplementation.  MRI lumbar with and without contrast showed  Stable remote superior endplate fracture of L2 and large Schmorl to noted involving L5. 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 PET scan shows stable bone lesions.  L4 fracture uptake likely benign- s/p nerve block - pending neurosurgery evaluation.  Hold off Zometa given possibility of upcoming back surgery.  Back pain Back pain, MRI lumbar showed DJD, L4-L5 synovial cyst,PET scan showed L4 fracture Continue PRN percocet 7.5mg /325mg , fentanyl patch Q72 hours     Encounter for antineoplastic immunotherapy Immunotherapy plan as listed above    Follow up 3 weeks All questions were answered. The patient knows to call the clinic with any problems, questions or concerns.  [name], MD, [name] [facility] Hematology Oncology 01/12/2023      HISTORY OF  PRESENTING ILLNESS:   Micheal Donovan is a  72 y.o.  male presents for follow up of Non-small cell lung cancer.  Oncology History Overview Note  Diagnosis: Stage IIB T2b N1 M0 adenocarcinoma of the LUL, poorly differentiated     Primary non-small cell carcinoma of upper lobe of left lung (HCC)  06/13/2021 Initial Diagnosis   Primary non-small cell carcinoma of upper lobe of left lung [facility]) -06/20/2021 - 06/27/2021, patient presented to [facility] due to progressive headache/dizziness/gait changes. CT head showed acute to subacute intraparenchymal hemorrhage involving the left cerebellum.  Surrounding low-density vasogenic edema.  Patient was transferred to [facility]. This was further evaluated by CT angiogram of the neck which showed bulky calcified plaques/stenosis of carotid artery, Followed by MRI brain. 06/12/2021, MRI of the brain showed no significant interval change in size of the left cerebellar intraparenchymal hematoma with unchanged regional mass effect and partial effacement of fourth ventricle but no upstream hydrocephalus. There is no discernible enhancement to suggest underlying mass lesion, though acute blood products could mask enhancement.   06/12/2021 a chest x-ray showed a 4.4 cm left middle lobe. 06/13/2021, CT chest with contrast showed a 4.3 x 3.6 x 3.3 cm lobular spiculated mass in the posterior left upper lobe with T3 to the lateral pleura and major fissure.  Metastatic left hilar lymphadenopathy.  Peripheral micronodularity posterior right costophrenic sulcus.  Aortic atherosclerosis. 06/14/2021, CT abdomen pelvis showed right lower lobe pulmonary artery embolus.  No evidence of right heart strain.  No acute intra-abdominal or pelvic pathology.  Aortic atherosclerosis.  06/13/2021, patient underwent bronchoscopy with EBUS by Dr. Katrinka Blazing.  Biopsy  from the fine-needle aspiration of station 11 mL showed malignant cells, consistent with poorly differentiated non-small cell  carcinoma, consistent with adenocarcinoma.  Malignant cells are TTF-1 positive and negative for p40.  Negative for neuroendocrine markers.  Blood test and tissue sample were tested for Gardant 360  -PD-L1 TPS 97%, no actionable mutation on the blood testing. 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   07/17/2021 Cancer Staging   Staging form: Lung, AJCC 8th Edition - Clinical: Stage IV (cT2b, cN1, cM1) - Signed by [name], MD on 08/06/2021   08/06/2021 Imaging   I saw patient's PET scan after his visit with me on 08/06/21. PET scan was ordered by his previous oncologist group and did not come to my in basket.. Patient received cycle 1 carboplatin Taxol today.  He has started on radiation.  5/26/3 PET scan showed 3.8 cm hypermetabolic left upper lobe mass with hypermetabolic left hilar and infrahilar adenopathy.  There is approximately 8 scattered metastatic lesions in the skeleton. Mixed density photopenic lesion in the left cerebellum. characterized as hemorrhage on recent prior imaging workups.    08/16/2021 -  Chemotherapy   Patient is on Treatment Plan : LUNG Carboplatin (5) + Pemetrexed + Pembrolizumab (200) D1 q21d Induction x 4 cycles / Maintenance Pemetrexed + Pembrolizumab (200) D1 q21d      08/22/2021 Imaging   MRI brain w wo contrast  Decrease in size of left cerebellar hematoma with resolution of edema. No evidence of underlying lesion. Smaller, more recent hemorrhage in the left cerebellar vermis with minimal edema. There is minimal enhancement without definite evidence of underlying lesion.  New punctate focus of chronic blood products and enhancement in the left frontal lobe. Additional new foci of chronic blood products in the posterior right putamen, right parietal subcortical white matter, and posterior right cerebellum. Unclear at this time if these represent foci of bland hemorrhage or early metastases.   Increase in size of right parietal osseous metastasis with  minor extraosseous extension.     Imaging   PET scan showed 1. Interval response to therapy as evidenced by a small residual left upper lobe nodule with decreased hypermetabolism, no residual hypermetabolic adenopathy and decreased hypermetabolism associatedwith osseous metastases. 2. 6 mm posterior left upper lobe nodule, likely stable. Recommend attention on follow-up. 3. Aortic atherosclerosis (ICD10-I70.0). Coronary artery calcification.   12/17/2021 Imaging   MRI lumbar spine w wo contrast  1. No evidence of regional metastatic disease. 2. Late subacute superior endplate fracture at L2 and large superior endplate Schmorl's node at L5, unchanged since the PET scan of 10/29/2021. 3. L3-4: Shallow disc protrusion. Facet and ligamentous hypertrophy. Stenosis of both lateral recesses. Findings slightly worsened since 2022. 4. L4-5: Shallow disc protrusion. Facet and ligamentous hypertrophy. Small synovial cyst arising from the facet joint on the right. Stenosis of the lateral recesses right worse than left. Foraminal narrowing right worse than left. Findings have worsened since 2022.  5. L5-S1: Endplate osteophytes and shallow protrusion of the disc. Facet and ligamentous hypertrophy. Stenosis of the subarticular lateral recesses and neural foramina, right worse than left. Similar appearance to the study of 2022. 6. Continued evidence of enteritis of at least 1 loop of small bowel. Distended bladder present   12/18/2021 Imaging   Brian MRI w wo  1. Interval decrease in size of the left cerebellar and vermian hematomas. No definite evidence of underlying metastatic lesion. 2. There is are two possible contrast enhancing lesions in the posterior left frontal lobe  and left occipital lobe, which do not have intrinsic T1 signal abnormality, but have a somewhat linear appearance and may be vascular in nature. Recommend attention on follow up. 3. Multifocal sites of susceptibility artifact are  redemonstrated with interval development of a few new foci, as described above. All of these sites demonstrate intrinsic T1 hyperintense signal abnormality and susceptibility artifact and are compatible with sites of microhemorrhages. Recommend continued attention on follow up. 4. Interval decrease in size of right parietal calvarial metastatic lesion. No new contrast enhancing lesions visualized. 5. Diffusely heterogeneous marrow signal throughout the cervical spine, which is nonspecific but can be seen in the setting of anemia, smoking, obesity, or a marrow replacement process.     12/18/2021 Imaging   MRI lumbar spine w wo contrast  1. No evidence of regional metastatic disease. 2. Late subacute superior endplate fracture at L2 and large superior endplate Schmorl's node at L5, unchanged since the PET scan of 10/29/2021 3. L3-4: Shallow disc protrusion. Facet and ligamentous hypertrophy.Stenosis of both lateral recesses. Findings slightly worsened since 2022. 4. L4-5: Shallow disc protrusion. Facet and ligamentous hypertrophy. Small synovial cyst arising from the facet joint on the right. Stenosis of the lateral recesses right worse than left. Foraminal narrowing right worse than left. Findings have worsened since 2022. 5. L5-S1: Endplate osteophytes and shallow protrusion of the disc. Facet and ligamentous hypertrophy. Stenosis of the subarticular lateral recesses and neural foramina, right worse than left. Similar appearance to the study of 2022. 6. Continued evidence of enteritis of at least 1 loop of small bowel. Distended bladder present.       01/28/2022 Imaging   CT chest abdomen pelvis w contrast 1. Treated left upper lobe mass appears grossly stable to the prior examination when measured in a similar fashion on the prior study. No definite signs of extra skeletal metastatic disease noted in the chest, abdomen or pelvis. 2. Widespread skeletal metastases redemonstrated, as  above. 3. Hepatic steatosis. 4. Aortic atherosclerosis, in addition to left main and three-vessel coronary artery disease. Assessment for potential risk factor modification, dietary therapy or pharmacologic therapy may be warranted, if clinically indicated. 5. Additional incidental findings,    04/08/2022 Imaging   MRI lumbar spine w wo contrast  1. Stable remote superior endplate fracture of L2 and large Schmorl to noted involving L5. No worrisome bone lesions to suggest metastatic disease. 2. Degenerative lumbar spondylosis with multilevel disc disease and facet disease which appears relatively stable as detailed above. 3. Enlarging right-sided synovial cyst at L4-5 with progressive mass effect on the right side of the thecal sac. This could potentially be symptomatic   04/28/2022 Imaging   CT chest abdomen pelvis wo contrast  1. Similar versus slightly decreased size of treated left upper lobe primary bronchogenic carcinoma. 2. Similar osseous metastasis. 3. No evidence of extraosseous metastatic disease. 4. New small right pleural effusion. 5. Coronary artery atherosclerosis. Aortic Atherosclerosis   07/03/2022 Imaging   PET scan showed 1. Interval development of a small focus of intense hypermetabolism within the left upper lobe scar. This is associated with a 5 mm perifissural nodule in the left lung which is hypermetabolic. Hypermetabolism at both of these locations is new since prior PET-CT and highly suspicious for recurrent disease. 2. No evidence for hypermetabolic soft tissue metastatic disease in the neck, abdomen, or pelvis. 3. Similar appearance of sclerotic and lytic bone lesions without hypermetabolism on PET imaging. 4. Focal hypermetabolism associated with a fracture of the right L4 transverse  process. The fracture was visible on the previous CT of 04/28/2022. No associated soft tissue lesion to suggest that this is pathologic in nature. Tracer accumulation on today's study is  in keeping with healing fracture. There is also some minimal uptake in a prominent spur associated with the L4 superior endplate, most likely degenerative. 5.  Aortic Atherosclerosis    08/01/2022 - 08/14/2022 Radiation Therapy   Radiation to left lung.    10/01/2022 Imaging   chest angiogram PE protocol  No pulmonary embolism identified.   Stable nodular opacity in the left upper lobe. Please correlate for history of treated lung cancer. There is an adjacent small nodule along the course of the interlobar fissure which is slightly larger today compared to the study of February 2024. please correlate with findings of the recent PET-CT of 07/03/2022.      10/02/2022 Imaging   MRI brain with and without contrast 1. No acute intracranial abnormality. No evidence for intraparenchymal metastatic disease. 2. Chronic left cerebellar hemorrhage with multiple additional chronic micro hemorrhages elsewhere throughout the brain, stable. 3. Underlying mild chronic microvascular ischemic disease.    10/02/2022 Imaging   MRI lumbar spine showed 1. No MRI evidence for acute infection or other abnormality within the lumbar spine. 2. Few subcentimeter T1 hypointense lesions about the visualized posterior iliac wings. These correspond with small sclerotic foci seen on most recent CT from 04/08/2022, presumably reflecting patient's known osseous metastatic disease. These were not hypermetabolic on prior PET-CT from 27/25/3664. 3. No other evidence for metastatic disease within the lumbar spine itself. 4. Chronic L2 compression fracture, stable. Large Schmorl's node deformity with associated height loss at L5, also stable. 5. Multifactorial degenerative changes at L3-4 through L5-S1 with resultant mild to moderate bilateral subarticular stenosis, with mild to moderate bilateral L3 through L5 foraminal narrowing as above.   12/24/2022 Imaging   CT chest w contrast   1. Stable appearance of treated  lung lesion within the left upper lobe with surrounding scarring and architectural distortion. 2. The previous FDG avid subpleural nodule abutting the oblique fissure of the left lung (adjacent to post treatment changes) is again noted measuring 7 mm. This is stable from 10/01/2022. On the PET-CT from 07/03/2022 this nodule was measured at 5 mm. 3. New bandlike area of subpleural consolidation within the lateral and posterior left lower lobe is identified. This is favored to represent post treatment change. 4. Stable sclerotic lesions involving the T12 and L1 vertebra. 5. Coronary artery calcifications. 6.  Aortic Atherosclerosis    # Patient has a history of melanoma on his neck, treated in 2009. # History of hemorrhagic infarct left cerebellum  10/01/2022 - 10/06/2022 patient was admitted to the hospital due to lower extremity weakness and intermittent aphasia. Patient reports acute on chronic left lower extremity weakness, weakness has been more prominent over the past 2 weeks.  He reports significant episode of difficulty with ambulation 2 days after his last Keytruda treatments.  He reports that he felt loss of control of muscle at that time.  Patient reports that the weakness is different than her chronic lower extremity  weakness.  Patient was found to have orthostatic hypotension Flomax irbesartan were held.  CK wnl, Neurology was consulted and started the patient on Keppra for possible seizure.  EEG was negative.   INTERVAL HISTORY Micheal Donovan is a 72 y.o. male who has above history reviewed by me today presents for follow up visit for management of  Stage IV lung  adenocarcinoma Accompanied by wife.  + back pain, and left lower extremity weakness due to pain, on fentanyl patch Q72 hours and percocet Patient was recently seen by neurology Dr. Barbaraann Cao.  Lower extremity weakness localizes best to lumbosacral nerve roots, known spondylosis with multiple foci of neuro-foraminal stenosis.   Pending neurosurgery evaluation.    Review of Systems  Constitutional:  Positive for appetite change, fatigue and unexpected weight change. Negative for chills and fever.  HENT:   Negative for hearing loss and voice change.   Eyes:  Negative for eye problems and icterus.  Respiratory:  Positive for shortness of breath. Negative for chest tightness and cough.   Cardiovascular:  Positive for leg swelling. Negative for chest pain.  Gastrointestinal:  Negative for abdominal distention, abdominal pain and blood in stool.  Endocrine: Negative for hot flashes.  Genitourinary:  Negative for difficulty urinating, dysuria and frequency.   Musculoskeletal:  Positive for back pain. Negative for arthralgias.  Skin:  Negative for itching and rash.  Neurological:  Negative for extremity weakness, headaches, light-headedness and numbness.  Hematological:  Negative for adenopathy. Does not bruise/bleed easily.  Psychiatric/Behavioral:  Positive for sleep disturbance. Negative for confusion.     MEDICAL HISTORY:  Past Medical History:  Diagnosis Date   Allergy    BPH (benign prostatic hypertrophy)    Cancer (HCC)    Melanoma on Neck    2008   Carotid artery occlusion    CHF (congestive heart failure) (HCC)    Diabetes mellitus    type 2   ED (erectile dysfunction)    GERD (gastroesophageal reflux disease)    Hemorrhagic stroke (HCC) 06/2021   Hyperlipidemia    Hypertension    Lung cancer (HCC)    Neoplasm related pain    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)     SURGICAL HISTORY: Past Surgical History:  Procedure Laterality Date   BRONCHIAL NEEDLE ASPIRATION BIOPSY  06/17/2021   Procedure: BRONCHIAL NEEDLE ASPIRATION BIOPSIES;  Surgeon: Lorin Glass, MD;  Location: [facility] ENDOSCOPY;  Service: Pulmonary;;   IR IMAGING GUIDED PORT INSERTION  08/05/2021   MELANOMA EXCISION  2008   Left side of neck   RADIOLOGY WITH ANESTHESIA N/A 04/05/2020   Procedure: MRI SPINE WITOUT CONTRAST;  Surgeon:  Radiologist, Medication, MD;  Location: [facility] OR;  Service: Radiology;  Laterality: N/A;   RADIOLOGY WITH ANESTHESIA N/A 08/22/2021   Procedure: MRI BRAIN WITH AND WITHOUT CONTRAST  WITH ANESTHESIA;  Surgeon: Radiologist, Medication, MD;  Location: [facility] OR;  Service: Radiology;  Laterality: N/A;   RADIOLOGY WITH ANESTHESIA N/A 12/17/2021   Procedure: MRI BRAIN WITH AND WITHOUT CONTRAST WITH ANESTHESIA; MRI LUMBER WITH AND WITHOUT CONTRAST;  Surgeon: Radiologist, Medication, MD;  Location: [facility] OR;  Service: Radiology;  Laterality: N/A;   RADIOLOGY WITH ANESTHESIA N/A 04/08/2022   Procedure: MRI LUMBER SPINE WITH AND WITHOUT CONTRAST;  Surgeon: Radiologist, Medication, MD;  Location: [facility] OR;  Service: Radiology;  Laterality: N/A;   TONSILLECTOMY     VIDEO BRONCHOSCOPY WITH ENDOBRONCHIAL ULTRASOUND N/A 06/17/2021   Procedure: VIDEO BRONCHOSCOPY WITH ENDOBRONCHIAL ULTRASOUND;  Surgeon: Lorin Glass, MD;  Location: [facility] ENDOSCOPY;  Service: Pulmonary;  Laterality: N/A;    SOCIAL HISTORY: Social History   Socioeconomic History   Marital status: Married    Spouse name: Not on file   Number of children: Not on file   Years of education: Not on file   Highest education level: Not on file  Occupational History  Not on file  Tobacco Use   Smoking status: Former    Current packs/day: 0.00    Types: Cigarettes    Quit date: 07/21/1990    Years since quitting: 32.5   Smokeless tobacco: Never  Vaping Use   Vaping status: Never Used  Substance and Sexual Activity   Alcohol use: No   Drug use: No   Sexual activity: Not Currently  Other Topics Concern   Not on file  Social History Narrative   Not on file   Social Determinants of Health   Financial Resource Strain: Low Risk  (04/17/2022)   Overall Financial Resource Strain (CARDIA)    Difficulty of Paying Living Expenses: Not hard at all  Food Insecurity: No Food Insecurity (10/01/2022)   Hunger Vital Sign    Worried About Running Out of Food in the  Last Year: Never true    Ran Out of Food in the Last Year: Never true  Transportation Needs: No Transportation Needs (10/01/2022)   PRAPARE - Administrator, Civil Service (Medical): No    Lack of Transportation (Non-Medical): No  Physical Activity: Inactive (03/27/2022)   Exercise Vital Sign    Days of Exercise per Week: 0 days    Minutes of Exercise per Session: 0 min  Stress: Stress Concern Present (03/27/2022)   [employer] of Occupational Health - Occupational Stress Questionnaire    Feeling of Stress : To some extent  Social Connections: Unknown (11/17/2022)   Received from [facility]   Social Network    Social Network: Not on file  Intimate Partner Violence: Unknown (11/17/2022)   Received from [facility]   HITS    Physically Hurt: Not on file    Insult or Talk Down To: Not on file    Threaten Physical Harm: Not on file    Scream or Curse: Not on file    FAMILY HISTORY: Family History  Problem Relation Age of Onset   COPD Mother    Heart disease Father    Heart disease Brother        MI at age 80   Stroke Neg Hx     ALLERGIES:  is allergic to niaspan [niacin].  MEDICATIONS:  Current Outpatient Medications  Medication Sig Dispense Refill   citalopram (CELEXA) 10 MG tablet Take 1 tablet (10 mg total) by mouth daily. 30 tablet 3   docusate sodium (COLACE) 100 MG capsule Take 100 mg by mouth 2 (two) times daily as needed for mild constipation.     fentaNYL (DURAGESIC) 75 MCG/HR Place 1 patch onto the skin every 3 (three) days. 10 patch 0   finasteride (PROSCAR) 5 MG tablet Take 1 tablet (5 mg total) by mouth daily. 30 tablet 0   gabapentin (NEURONTIN) 300 MG capsule Take 2 capsules (600 mg total) by mouth 2 (two) times daily. 120 capsule 2   levETIRAcetam (KEPPRA) 500 MG tablet Take 1 tablet (500 mg total) by mouth 2 (two) times daily. 60 tablet 5   megestrol (MEGACE) 40 MG tablet Take 1 tablet (40 mg total) by mouth daily. 30 tablet 3   NOVOLOG  FLEXPEN 100 UNIT/ML FlexPen Inject 0-10 Units into the skin daily as needed for high blood sugar (BG > 200).     Omeprazole 20 MG TBEC Take 20 mg by mouth daily.     oxyCODONE-acetaminophen (PERCOCET) 7.5-325 MG tablet Take 1 tablet by mouth every 4 (four) hours as needed for severe pain. Do not take with cough  medication or other pain medication 60 tablet 0   valsartan (DIOVAN) 80 MG tablet Take 80 mg by mouth daily.     ALPRAZolam (XANAX) 0.5 MG tablet Take 1 tablet (0.5 mg total) by mouth at bedtime as needed for anxiety. (Patient not taking: Reported on 12/30/2022) 15 tablet 0   No current facility-administered medications for this visit.   Facility-Administered Medications Ordered in Other Visits  Medication Dose Route Frequency Provider Last Rate Last Admin   heparin lock flush 100 UNIT/ML injection              PHYSICAL EXAMINATION:  Vitals:   01/12/23 1311  BP: 127/66  Pulse: 67  Resp: 18  Temp: (!) 96.6 F (35.9 C)  SpO2: 100%   Filed Weights   01/12/23 1311  Weight: 136 lb 4.8 oz (61.8 kg)    Physical Exam Constitutional:      General: He is not in acute distress.    Appearance: He is ill-appearing.  HENT:     Head: Normocephalic and atraumatic.  Eyes:     General: No scleral icterus. Cardiovascular:     Rate and Rhythm: Normal rate and regular rhythm.     Heart sounds: Normal heart sounds.  Pulmonary:     Effort: Pulmonary effort is normal. No respiratory distress.     Breath sounds: No wheezing.     Comments: Decreased breath sound bilaterally. Abdominal:     General: Bowel sounds are normal. There is no distension.     Palpations: Abdomen is soft.  Musculoskeletal:        General: No deformity. Normal range of motion.     Cervical back: Normal range of motion and neck supple.     Comments: Bilateral ankle trace edema.  Skin:    General: Skin is warm and dry.     Findings: No rash.  Neurological:     Mental Status: He is alert. Mental status is at  baseline.     Cranial Nerves: No cranial nerve deficit.     Comments: Chronic left-sided weakness  Psychiatric:        Mood and Affect: Mood normal.     LABORATORY DATA:  I have reviewed the data as listed    Latest Ref Rng & Units 01/12/2023   12:52 PM 12/22/2022    1:03 PM 12/01/2022    1:02 PM  CBC  WBC 4.0 - 10.5 K/uL 5.8  7.1  5.9   Hemoglobin 13.0 - 17.0 g/dL 42.5  95.6  38.7   Hematocrit 39.0 - 52.0 % 35.0  38.7  33.8   Platelets 150 - 400 K/uL 257  279  261       Latest Ref Rng & Units 12/22/2022    1:03 PM 12/01/2022    1:02 PM 10/20/2022    1:55 PM  CMP  Glucose 70 - 99 mg/dL 82  564  332   BUN 8 - 23 mg/dL 21  21  20    Creatinine 0.61 - 1.24 mg/dL 9.51  8.84  1.66   Sodium 135 - 145 mmol/L 132  134  133   Potassium 3.5 - 5.1 mmol/L 4.0  3.5  3.9   Chloride 98 - 111 mmol/L 101  104  104   CO2 22 - 32 mmol/L 23  23  22    Calcium 8.9 - 10.3 mg/dL 8.7  8.8  8.7   Total Protein 6.5 - 8.1 g/dL 7.0  6.9  7.1   Total Bilirubin  0.3 - 1.2 mg/dL 0.3  0.5  0.5   Alkaline Phos 38 - 126 U/L 64  56  69   AST 15 - 41 U/L 19  14  18    ALT 0 - 44 U/L 11  10  13       Iron/TIBC/Ferritin/ %Sat    Component Value Date/Time   IRON 60 02/03/2022 0958   TIBC 391 02/03/2022 0958   FERRITIN 704 (H) 02/03/2022 0958   IRONPCTSAT 15 (L) 02/03/2022 1610      RADIOGRAPHIC STUDIES: I have personally reviewed the radiological images as listed and agreed with the findings in the report. MR CERVICAL SPINE WO CONTRAST  Result Date: 01/09/2023 CLINICAL DATA:  Initial evaluation for chronic myelopathy. Neck pain. EXAM: MRI CERVICAL SPINE WITHOUT CONTRAST TECHNIQUE: Multiplanar, multisequence MR imaging of the cervical spine was performed. No intravenous contrast was administered. COMPARISON:  None Available. FINDINGS: Alignment: Straightening of the normal cervical lordosis. No significant listhesis. Vertebrae: Vertebral body height maintained without acute or chronic fracture. Prior fusion  at C5-6. Bone marrow signal intensity within normal limits. No worrisome osseous lesions. No significant abnormal marrow edema. Cord: Patchy signal abnormality involving the cervical cord at the level of C5-6, consistent with chronic myelomalacia. Focal cord atrophy at this level. Posterior Fossa, vertebral arteries, paraspinal tissues: Age-related cerebral atrophy. Craniocervical junction within normal limits. Paraspinous soft tissues normal. Normal flow voids seen within the vertebral arteries bilaterally. Sphenoid sinus retention cyst noted. Disc levels: C2-C3: Central disc osteophyte complex indents and partially faces the ventral thecal sac. Moderate right with mild left facet arthrosis. Mild spinal stenosis. Mild left with severe right C3 foraminal stenosis. C3-C4: Degenerative vertebral disc space narrowing. Broad-based posterior disc osteophyte complex flattens and effaces the ventral thecal sac, asymmetric to the left. Superimposed facet and ligament flavum hypertrophy. Secondary cord flattening without visible cord signal changes. Severe spinal stenosis. Severe left with moderate right C4 foraminal narrowing. C4-C5: Degenerate intervertebral disc space narrowing with diffuse disc osteophyte complex. Broad posterior component flattens and effaces the ventral thecal sac. Superimposed facet and ligament flavum hypertrophy. Secondary cord flattening without cord signal changes. Severe spinal stenosis with severe bilateral C5 foraminal narrowing. C5-C6: Prior fusion. Endplate osseous ridging indents the ventral thecal sac with residual mild spinal stenosis. Foramina appear patent. C6-C7: Degenerative vertebral disc space narrowing. Central to left paracentral disc osteophyte complex indents the ventral thecal sac (series 108, image 23). Minimal cord flattening without cord signal changes. Mild spinal stenosis. Bilateral uncovertebral spurring with resultant moderate bilateral C7 foraminal stenosis. C7-T1:  Degenerative disc space narrowing with diffuse disc bulge. Endplate and uncovertebral spurring. Mild facet hypertrophy. No spinal stenosis. Mild to moderate left C8 foraminal narrowing. Right neural foramen remains patent. T1-2: Seen only on sagittal projection. Diffuse disc bulge with endplate spurring. No significant spinal stenosis. Mild to moderate bilateral foraminal narrowing. T2-3: Seen only on sagittal projection. Mild disc bulge with endplate spurring. No spinal stenosis. Mild to moderate bilateral foraminal narrowing. IMPRESSION: 1. Prior fusion at C5-6 with residual mild spinal stenosis. Patchy signal abnormality involving the cervical cord at this level, consistent with chronic myelomalacia. 2. Degenerative spondylosis and facet hypertrophy at C3-4 and C4-5 with resultant severe spinal stenosis, with moderate to severe bilateral C4 and C5 foraminal narrowing as above. 3. Degenerative disc osteophyte at C6-7 with resultant mild spinal stenosis, with moderate bilateral C7 foraminal narrowing. Electronically Signed   By: Rise Mu M.D.   On: 01/09/2023 20:56   CT Chest W Contrast  Result Date: 12/24/2022 CLINICAL DATA:  Restaging non-small cell lung cancer. * Tracking Code: BO * . EXAM: CT CHEST WITH CONTRAST TECHNIQUE: Multidetector CT imaging of the chest was performed during intravenous contrast administration. RADIATION DOSE REDUCTION: This exam was performed according to the departmental dose-optimization program which includes automated exposure control, adjustment of the mA and/or kV according to patient size and/or use of iterative reconstruction technique. CONTRAST:  75mL OMNIPAQUE IOHEXOL 300 MG/ML  SOLN COMPARISON:  CT angio chest 10/01/2022 and PET-CT from 07/03/2022. FINDINGS: Cardiovascular: The heart size appears within normal limits. Aortic atherosclerosis and coronary artery calcifications. No pericardial effusion. Mediastinum/Nodes: Thyroid gland, trachea, and esophagus  appear normal. No enlarged axillary, supraclavicular, mediastinal or hilar lymph nodes. Lungs/Pleura: No pleural effusion. The treated lung lesion within the left upper lobe with surrounding scarring and architectural distortion measures approximately 2.3 by 1.8 cm, image 64/2. On the PET-CT from 07/03/2022 this measured 2.3 x 1.5 cm. The previous FDG avid subpleural nodule abutting the oblique fissure of the left lung (adjacent to post treatment changes) is again noted measuring 7 mm, image 67/3. This is stable from 10/01/2022. On the PET-CT from 07/03/2022 this nodule was measured at 5 mm. New bandlike area of subpleural consolidation within the lateral and posterior left lower lobe is identified, image 69/3. No new lung nodules identified. Upper Abdomen: No acute abnormality. No mass or adenopathy identified. Musculoskeletal: No acute findings. Sclerotic lesions involving the T12 and L1 vertebra are stable compared with the previous exam. IMPRESSION: 1. Stable appearance of treated lung lesion within the left upper lobe with surrounding scarring and architectural distortion. 2. The previous FDG avid subpleural nodule abutting the oblique fissure of the left lung (adjacent to post treatment changes) is again noted measuring 7 mm. This is stable from 10/01/2022. On the PET-CT from 07/03/2022 this nodule was measured at 5 mm. 3. New bandlike area of subpleural consolidation within the lateral and posterior left lower lobe is identified. This is favored to represent post treatment change. 4. Stable sclerotic lesions involving the T12 and L1 vertebra. 5. Coronary artery calcifications. 6.  Aortic Atherosclerosis (ICD10-I70.0). Electronically Signed   By: Signa Kell M.D.   On: 12/24/2022 08:40

## 2023-01-12 NOTE — H&P (View-Only) (Signed)
 Hematology/Oncology Progress note [phone] [phone]       CHIEF COMPLAINTS/REASON FOR VISIT:  Metastatic non-small cell lung cancer  ASSESSMENT & PLAN:   Cancer Staging  Primary non-small cell carcinoma of upper lobe of left lung (HCC) Staging form: Lung, AJCC 8th Edition - Clinical: Stage IV (cT2b, cN1, cM1) - Signed by [name], MD on 08/06/2021   Primary non-small cell carcinoma of upper lobe of left lung (HCC) Stage IV lung adenocarcinoma with brain and bone metastasis.  S/p lung radiation to left lung.  12/19/2022, CT chest stable disease.  Labs are reviewed and discussed with patient. Proceed with Keytruda     Intraparenchymal hemorrhage of brain (HCC) Hold off Eliquis per neurology Dr. Barbaraann Cao 10/02/22 Repeat MRI brain-stable findings   Pulmonary embolus (HCC) Off Eliquis 2.5mg  BID due to concern of microbleeds in brain  Metastasis to bone (HCC) Recommend Zometa every 4- 6 weeks.-  hold off today due to possible upcoming surgery Continue calcium supplementation.  MRI lumbar with and without contrast showed  Stable remote superior endplate fracture of L2 and large Schmorl to noted involving L5. 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 PET scan shows stable bone lesions.  L4 fracture uptake likely benign- s/p nerve block - pending neurosurgery evaluation.  Hold off Zometa given possibility of upcoming back surgery.  Back pain Back pain, MRI lumbar showed DJD, L4-L5 synovial cyst,PET scan showed L4 fracture Continue PRN percocet 7.5mg /325mg , fentanyl patch Q72 hours     Encounter for antineoplastic immunotherapy Immunotherapy plan as listed above    Follow up 3 weeks All questions were answered. The patient knows to call the clinic with any problems, questions or concerns.  [name], MD, [name] [facility] Hematology Oncology 01/12/2023      HISTORY OF  PRESENTING ILLNESS:   Micheal Donovan is a  72 y.o.  male presents for follow up of Non-small cell lung cancer.  Oncology History Overview Note  Diagnosis: Stage IIB T2b N1 M0 adenocarcinoma of the LUL, poorly differentiated     Primary non-small cell carcinoma of upper lobe of left lung (HCC)  06/13/2021 Initial Diagnosis   Primary non-small cell carcinoma of upper lobe of left lung [facility]) -06/20/2021 - 06/27/2021, patient presented to [facility] due to progressive headache/dizziness/gait changes. CT head showed acute to subacute intraparenchymal hemorrhage involving the left cerebellum.  Surrounding low-density vasogenic edema.  Patient was transferred to [facility]. This was further evaluated by CT angiogram of the neck which showed bulky calcified plaques/stenosis of carotid artery, Followed by MRI brain. 06/12/2021, MRI of the brain showed no significant interval change in size of the left cerebellar intraparenchymal hematoma with unchanged regional mass effect and partial effacement of fourth ventricle but no upstream hydrocephalus. There is no discernible enhancement to suggest underlying mass lesion, though acute blood products could mask enhancement.   06/12/2021 a chest x-ray showed a 4.4 cm left middle lobe. 06/13/2021, CT chest with contrast showed a 4.3 x 3.6 x 3.3 cm lobular spiculated mass in the posterior left upper lobe with T3 to the lateral pleura and major fissure.  Metastatic left hilar lymphadenopathy.  Peripheral micronodularity posterior right costophrenic sulcus.  Aortic atherosclerosis. 06/14/2021, CT abdomen pelvis showed right lower lobe pulmonary artery embolus.  No evidence of right heart strain.  No acute intra-abdominal or pelvic pathology.  Aortic atherosclerosis.  06/13/2021, patient underwent bronchoscopy with EBUS by Dr. Katrinka Blazing.  Biopsy  from the fine-needle aspiration of station 11 mL showed malignant cells, consistent with poorly differentiated non-small cell  carcinoma, consistent with adenocarcinoma.  Malignant cells are TTF-1 positive and negative for p40.  Negative for neuroendocrine markers.  Blood test and tissue sample were tested for Gardant 360  -PD-L1 TPS 97%, no actionable mutation on the blood testing. 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   07/17/2021 Cancer Staging   Staging form: Lung, AJCC 8th Edition - Clinical: Stage IV (cT2b, cN1, cM1) - Signed by [name], MD on 08/06/2021   08/06/2021 Imaging   I saw patient's PET scan after his visit with me on 08/06/21. PET scan was ordered by his previous oncologist group and did not come to my in basket.. Patient received cycle 1 carboplatin Taxol today.  He has started on radiation.  5/26/3 PET scan showed 3.8 cm hypermetabolic left upper lobe mass with hypermetabolic left hilar and infrahilar adenopathy.  There is approximately 8 scattered metastatic lesions in the skeleton. Mixed density photopenic lesion in the left cerebellum. characterized as hemorrhage on recent prior imaging workups.    08/16/2021 -  Chemotherapy   Patient is on Treatment Plan : LUNG Carboplatin (5) + Pemetrexed + Pembrolizumab (200) D1 q21d Induction x 4 cycles / Maintenance Pemetrexed + Pembrolizumab (200) D1 q21d      08/22/2021 Imaging   MRI brain w wo contrast  Decrease in size of left cerebellar hematoma with resolution of edema. No evidence of underlying lesion. Smaller, more recent hemorrhage in the left cerebellar vermis with minimal edema. There is minimal enhancement without definite evidence of underlying lesion.  New punctate focus of chronic blood products and enhancement in the left frontal lobe. Additional new foci of chronic blood products in the posterior right putamen, right parietal subcortical white matter, and posterior right cerebellum. Unclear at this time if these represent foci of bland hemorrhage or early metastases.   Increase in size of right parietal osseous metastasis with  minor extraosseous extension.     Imaging   PET scan showed 1. Interval response to therapy as evidenced by a small residual left upper lobe nodule with decreased hypermetabolism, no residual hypermetabolic adenopathy and decreased hypermetabolism associatedwith osseous metastases. 2. 6 mm posterior left upper lobe nodule, likely stable. Recommend attention on follow-up. 3. Aortic atherosclerosis (ICD10-I70.0). Coronary artery calcification.   12/17/2021 Imaging   MRI lumbar spine w wo contrast  1. No evidence of regional metastatic disease. 2. Late subacute superior endplate fracture at L2 and large superior endplate Schmorl's node at L5, unchanged since the PET scan of 10/29/2021. 3. L3-4: Shallow disc protrusion. Facet and ligamentous hypertrophy. Stenosis of both lateral recesses. Findings slightly worsened since 2022. 4. L4-5: Shallow disc protrusion. Facet and ligamentous hypertrophy. Small synovial cyst arising from the facet joint on the right. Stenosis of the lateral recesses right worse than left. Foraminal narrowing right worse than left. Findings have worsened since 2022.  5. L5-S1: Endplate osteophytes and shallow protrusion of the disc. Facet and ligamentous hypertrophy. Stenosis of the subarticular lateral recesses and neural foramina, right worse than left. Similar appearance to the study of 2022. 6. Continued evidence of enteritis of at least 1 loop of small bowel. Distended bladder present   12/18/2021 Imaging   Brian MRI w wo  1. Interval decrease in size of the left cerebellar and vermian hematomas. No definite evidence of underlying metastatic lesion. 2. There is are two possible contrast enhancing lesions in the posterior left frontal lobe  and left occipital lobe, which do not have intrinsic T1 signal abnormality, but have a somewhat linear appearance and may be vascular in nature. Recommend attention on follow up. 3. Multifocal sites of susceptibility artifact are  redemonstrated with interval development of a few new foci, as described above. All of these sites demonstrate intrinsic T1 hyperintense signal abnormality and susceptibility artifact and are compatible with sites of microhemorrhages. Recommend continued attention on follow up. 4. Interval decrease in size of right parietal calvarial metastatic lesion. No new contrast enhancing lesions visualized. 5. Diffusely heterogeneous marrow signal throughout the cervical spine, which is nonspecific but can be seen in the setting of anemia, smoking, obesity, or a marrow replacement process.     12/18/2021 Imaging   MRI lumbar spine w wo contrast  1. No evidence of regional metastatic disease. 2. Late subacute superior endplate fracture at L2 and large superior endplate Schmorl's node at L5, unchanged since the PET scan of 10/29/2021 3. L3-4: Shallow disc protrusion. Facet and ligamentous hypertrophy.Stenosis of both lateral recesses. Findings slightly worsened since 2022. 4. L4-5: Shallow disc protrusion. Facet and ligamentous hypertrophy. Small synovial cyst arising from the facet joint on the right. Stenosis of the lateral recesses right worse than left. Foraminal narrowing right worse than left. Findings have worsened since 2022. 5. L5-S1: Endplate osteophytes and shallow protrusion of the disc. Facet and ligamentous hypertrophy. Stenosis of the subarticular lateral recesses and neural foramina, right worse than left. Similar appearance to the study of 2022. 6. Continued evidence of enteritis of at least 1 loop of small bowel. Distended bladder present.       01/28/2022 Imaging   CT chest abdomen pelvis w contrast 1. Treated left upper lobe mass appears grossly stable to the prior examination when measured in a similar fashion on the prior study. No definite signs of extra skeletal metastatic disease noted in the chest, abdomen or pelvis. 2. Widespread skeletal metastases redemonstrated, as  above. 3. Hepatic steatosis. 4. Aortic atherosclerosis, in addition to left main and three-vessel coronary artery disease. Assessment for potential risk factor modification, dietary therapy or pharmacologic therapy may be warranted, if clinically indicated. 5. Additional incidental findings,    04/08/2022 Imaging   MRI lumbar spine w wo contrast  1. Stable remote superior endplate fracture of L2 and large Schmorl to noted involving L5. No worrisome bone lesions to suggest metastatic disease. 2. Degenerative lumbar spondylosis with multilevel disc disease and facet disease which appears relatively stable as detailed above. 3. Enlarging right-sided synovial cyst at L4-5 with progressive mass effect on the right side of the thecal sac. This could potentially be symptomatic   04/28/2022 Imaging   CT chest abdomen pelvis wo contrast  1. Similar versus slightly decreased size of treated left upper lobe primary bronchogenic carcinoma. 2. Similar osseous metastasis. 3. No evidence of extraosseous metastatic disease. 4. New small right pleural effusion. 5. Coronary artery atherosclerosis. Aortic Atherosclerosis   07/03/2022 Imaging   PET scan showed 1. Interval development of a small focus of intense hypermetabolism within the left upper lobe scar. This is associated with a 5 mm perifissural nodule in the left lung which is hypermetabolic. Hypermetabolism at both of these locations is new since prior PET-CT and highly suspicious for recurrent disease. 2. No evidence for hypermetabolic soft tissue metastatic disease in the neck, abdomen, or pelvis. 3. Similar appearance of sclerotic and lytic bone lesions without hypermetabolism on PET imaging. 4. Focal hypermetabolism associated with a fracture of the right L4 transverse  process. The fracture was visible on the previous CT of 04/28/2022. No associated soft tissue lesion to suggest that this is pathologic in nature. Tracer accumulation on today's study is  in keeping with healing fracture. There is also some minimal uptake in a prominent spur associated with the L4 superior endplate, most likely degenerative. 5.  Aortic Atherosclerosis    08/01/2022 - 08/14/2022 Radiation Therapy   Radiation to left lung.    10/01/2022 Imaging   chest angiogram PE protocol  No pulmonary embolism identified.   Stable nodular opacity in the left upper lobe. Please correlate for history of treated lung cancer. There is an adjacent small nodule along the course of the interlobar fissure which is slightly larger today compared to the study of February 2024. please correlate with findings of the recent PET-CT of 07/03/2022.      10/02/2022 Imaging   MRI brain with and without contrast 1. No acute intracranial abnormality. No evidence for intraparenchymal metastatic disease. 2. Chronic left cerebellar hemorrhage with multiple additional chronic micro hemorrhages elsewhere throughout the brain, stable. 3. Underlying mild chronic microvascular ischemic disease.    10/02/2022 Imaging   MRI lumbar spine showed 1. No MRI evidence for acute infection or other abnormality within the lumbar spine. 2. Few subcentimeter T1 hypointense lesions about the visualized posterior iliac wings. These correspond with small sclerotic foci seen on most recent CT from 04/08/2022, presumably reflecting patient's known osseous metastatic disease. These were not hypermetabolic on prior PET-CT from 27/25/3664. 3. No other evidence for metastatic disease within the lumbar spine itself. 4. Chronic L2 compression fracture, stable. Large Schmorl's node deformity with associated height loss at L5, also stable. 5. Multifactorial degenerative changes at L3-4 through L5-S1 with resultant mild to moderate bilateral subarticular stenosis, with mild to moderate bilateral L3 through L5 foraminal narrowing as above.   12/24/2022 Imaging   CT chest w contrast   1. Stable appearance of treated  lung lesion within the left upper lobe with surrounding scarring and architectural distortion. 2. The previous FDG avid subpleural nodule abutting the oblique fissure of the left lung (adjacent to post treatment changes) is again noted measuring 7 mm. This is stable from 10/01/2022. On the PET-CT from 07/03/2022 this nodule was measured at 5 mm. 3. New bandlike area of subpleural consolidation within the lateral and posterior left lower lobe is identified. This is favored to represent post treatment change. 4. Stable sclerotic lesions involving the T12 and L1 vertebra. 5. Coronary artery calcifications. 6.  Aortic Atherosclerosis    # Patient has a history of melanoma on his neck, treated in 2009. # History of hemorrhagic infarct left cerebellum  10/01/2022 - 10/06/2022 patient was admitted to the hospital due to lower extremity weakness and intermittent aphasia. Patient reports acute on chronic left lower extremity weakness, weakness has been more prominent over the past 2 weeks.  He reports significant episode of difficulty with ambulation 2 days after his last Keytruda treatments.  He reports that he felt loss of control of muscle at that time.  Patient reports that the weakness is different than her chronic lower extremity  weakness.  Patient was found to have orthostatic hypotension Flomax irbesartan were held.  CK wnl, Neurology was consulted and started the patient on Keppra for possible seizure.  EEG was negative.   INTERVAL HISTORY Micheal Donovan is a 72 y.o. male who has above history reviewed by me today presents for follow up visit for management of  Stage IV lung  adenocarcinoma Accompanied by wife.  + back pain, and left lower extremity weakness due to pain, on fentanyl patch Q72 hours and percocet Patient was recently seen by neurology Dr. Barbaraann Cao.  Lower extremity weakness localizes best to lumbosacral nerve roots, known spondylosis with multiple foci of neuro-foraminal stenosis.   Pending neurosurgery evaluation.    Review of Systems  Constitutional:  Positive for appetite change, fatigue and unexpected weight change. Negative for chills and fever.  HENT:   Negative for hearing loss and voice change.   Eyes:  Negative for eye problems and icterus.  Respiratory:  Positive for shortness of breath. Negative for chest tightness and cough.   Cardiovascular:  Positive for leg swelling. Negative for chest pain.  Gastrointestinal:  Negative for abdominal distention, abdominal pain and blood in stool.  Endocrine: Negative for hot flashes.  Genitourinary:  Negative for difficulty urinating, dysuria and frequency.   Musculoskeletal:  Positive for back pain. Negative for arthralgias.  Skin:  Negative for itching and rash.  Neurological:  Negative for extremity weakness, headaches, light-headedness and numbness.  Hematological:  Negative for adenopathy. Does not bruise/bleed easily.  Psychiatric/Behavioral:  Positive for sleep disturbance. Negative for confusion.     MEDICAL HISTORY:  Past Medical History:  Diagnosis Date   Allergy    BPH (benign prostatic hypertrophy)    Cancer (HCC)    Melanoma on Neck    2008   Carotid artery occlusion    CHF (congestive heart failure) (HCC)    Diabetes mellitus    type 2   ED (erectile dysfunction)    GERD (gastroesophageal reflux disease)    Hemorrhagic stroke (HCC) 06/2021   Hyperlipidemia    Hypertension    Lung cancer (HCC)    Neoplasm related pain    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)     SURGICAL HISTORY: Past Surgical History:  Procedure Laterality Date   BRONCHIAL NEEDLE ASPIRATION BIOPSY  06/17/2021   Procedure: BRONCHIAL NEEDLE ASPIRATION BIOPSIES;  Surgeon: Lorin Glass, MD;  Location: [facility] ENDOSCOPY;  Service: Pulmonary;;   IR IMAGING GUIDED PORT INSERTION  08/05/2021   MELANOMA EXCISION  2008   Left side of neck   RADIOLOGY WITH ANESTHESIA N/A 04/05/2020   Procedure: MRI SPINE WITOUT CONTRAST;  Surgeon:  Radiologist, Medication, MD;  Location: [facility] OR;  Service: Radiology;  Laterality: N/A;   RADIOLOGY WITH ANESTHESIA N/A 08/22/2021   Procedure: MRI BRAIN WITH AND WITHOUT CONTRAST  WITH ANESTHESIA;  Surgeon: Radiologist, Medication, MD;  Location: [facility] OR;  Service: Radiology;  Laterality: N/A;   RADIOLOGY WITH ANESTHESIA N/A 12/17/2021   Procedure: MRI BRAIN WITH AND WITHOUT CONTRAST WITH ANESTHESIA; MRI LUMBER WITH AND WITHOUT CONTRAST;  Surgeon: Radiologist, Medication, MD;  Location: [facility] OR;  Service: Radiology;  Laterality: N/A;   RADIOLOGY WITH ANESTHESIA N/A 04/08/2022   Procedure: MRI LUMBER SPINE WITH AND WITHOUT CONTRAST;  Surgeon: Radiologist, Medication, MD;  Location: [facility] OR;  Service: Radiology;  Laterality: N/A;   TONSILLECTOMY     VIDEO BRONCHOSCOPY WITH ENDOBRONCHIAL ULTRASOUND N/A 06/17/2021   Procedure: VIDEO BRONCHOSCOPY WITH ENDOBRONCHIAL ULTRASOUND;  Surgeon: Lorin Glass, MD;  Location: [facility] ENDOSCOPY;  Service: Pulmonary;  Laterality: N/A;    SOCIAL HISTORY: Social History   Socioeconomic History   Marital status: Married    Spouse name: Not on file   Number of children: Not on file   Years of education: Not on file   Highest education level: Not on file  Occupational History  Not on file  Tobacco Use   Smoking status: Former    Current packs/day: 0.00    Types: Cigarettes    Quit date: 07/21/1990    Years since quitting: 32.5   Smokeless tobacco: Never  Vaping Use   Vaping status: Never Used  Substance and Sexual Activity   Alcohol use: No   Drug use: No   Sexual activity: Not Currently  Other Topics Concern   Not on file  Social History Narrative   Not on file   Social Determinants of Health   Financial Resource Strain: Low Risk  (04/17/2022)   Overall Financial Resource Strain (CARDIA)    Difficulty of Paying Living Expenses: Not hard at all  Food Insecurity: No Food Insecurity (10/01/2022)   Hunger Vital Sign    Worried About Running Out of Food in the  Last Year: Never true    Ran Out of Food in the Last Year: Never true  Transportation Needs: No Transportation Needs (10/01/2022)   PRAPARE - Administrator, Civil Service (Medical): No    Lack of Transportation (Non-Medical): No  Physical Activity: Inactive (03/27/2022)   Exercise Vital Sign    Days of Exercise per Week: 0 days    Minutes of Exercise per Session: 0 min  Stress: Stress Concern Present (03/27/2022)   [employer] of Occupational Health - Occupational Stress Questionnaire    Feeling of Stress : To some extent  Social Connections: Unknown (11/17/2022)   Received from [facility]   Social Network    Social Network: Not on file  Intimate Partner Violence: Unknown (11/17/2022)   Received from [facility]   HITS    Physically Hurt: Not on file    Insult or Talk Down To: Not on file    Threaten Physical Harm: Not on file    Scream or Curse: Not on file    FAMILY HISTORY: Family History  Problem Relation Age of Onset   COPD Mother    Heart disease Father    Heart disease Brother        MI at age 80   Stroke Neg Hx     ALLERGIES:  is allergic to niaspan [niacin].  MEDICATIONS:  Current Outpatient Medications  Medication Sig Dispense Refill   citalopram (CELEXA) 10 MG tablet Take 1 tablet (10 mg total) by mouth daily. 30 tablet 3   docusate sodium (COLACE) 100 MG capsule Take 100 mg by mouth 2 (two) times daily as needed for mild constipation.     fentaNYL (DURAGESIC) 75 MCG/HR Place 1 patch onto the skin every 3 (three) days. 10 patch 0   finasteride (PROSCAR) 5 MG tablet Take 1 tablet (5 mg total) by mouth daily. 30 tablet 0   gabapentin (NEURONTIN) 300 MG capsule Take 2 capsules (600 mg total) by mouth 2 (two) times daily. 120 capsule 2   levETIRAcetam (KEPPRA) 500 MG tablet Take 1 tablet (500 mg total) by mouth 2 (two) times daily. 60 tablet 5   megestrol (MEGACE) 40 MG tablet Take 1 tablet (40 mg total) by mouth daily. 30 tablet 3   NOVOLOG  FLEXPEN 100 UNIT/ML FlexPen Inject 0-10 Units into the skin daily as needed for high blood sugar (BG > 200).     Omeprazole 20 MG TBEC Take 20 mg by mouth daily.     oxyCODONE-acetaminophen (PERCOCET) 7.5-325 MG tablet Take 1 tablet by mouth every 4 (four) hours as needed for severe pain. Do not take with cough  medication or other pain medication 60 tablet 0   valsartan (DIOVAN) 80 MG tablet Take 80 mg by mouth daily.     ALPRAZolam (XANAX) 0.5 MG tablet Take 1 tablet (0.5 mg total) by mouth at bedtime as needed for anxiety. (Patient not taking: Reported on 12/30/2022) 15 tablet 0   No current facility-administered medications for this visit.   Facility-Administered Medications Ordered in Other Visits  Medication Dose Route Frequency Provider Last Rate Last Admin   heparin lock flush 100 UNIT/ML injection              PHYSICAL EXAMINATION:  Vitals:   01/12/23 1311  BP: 127/66  Pulse: 67  Resp: 18  Temp: (!) 96.6 F (35.9 C)  SpO2: 100%   Filed Weights   01/12/23 1311  Weight: 136 lb 4.8 oz (61.8 kg)    Physical Exam Constitutional:      General: He is not in acute distress.    Appearance: He is ill-appearing.  HENT:     Head: Normocephalic and atraumatic.  Eyes:     General: No scleral icterus. Cardiovascular:     Rate and Rhythm: Normal rate and regular rhythm.     Heart sounds: Normal heart sounds.  Pulmonary:     Effort: Pulmonary effort is normal. No respiratory distress.     Breath sounds: No wheezing.     Comments: Decreased breath sound bilaterally. Abdominal:     General: Bowel sounds are normal. There is no distension.     Palpations: Abdomen is soft.  Musculoskeletal:        General: No deformity. Normal range of motion.     Cervical back: Normal range of motion and neck supple.     Comments: Bilateral ankle trace edema.  Skin:    General: Skin is warm and dry.     Findings: No rash.  Neurological:     Mental Status: He is alert. Mental status is at  baseline.     Cranial Nerves: No cranial nerve deficit.     Comments: Chronic left-sided weakness  Psychiatric:        Mood and Affect: Mood normal.     LABORATORY DATA:  I have reviewed the data as listed    Latest Ref Rng & Units 01/12/2023   12:52 PM 12/22/2022    1:03 PM 12/01/2022    1:02 PM  CBC  WBC 4.0 - 10.5 K/uL 5.8  7.1  5.9   Hemoglobin 13.0 - 17.0 g/dL 42.5  95.6  38.7   Hematocrit 39.0 - 52.0 % 35.0  38.7  33.8   Platelets 150 - 400 K/uL 257  279  261       Latest Ref Rng & Units 12/22/2022    1:03 PM 12/01/2022    1:02 PM 10/20/2022    1:55 PM  CMP  Glucose 70 - 99 mg/dL 82  564  332   BUN 8 - 23 mg/dL 21  21  20    Creatinine 0.61 - 1.24 mg/dL 9.51  8.84  1.66   Sodium 135 - 145 mmol/L 132  134  133   Potassium 3.5 - 5.1 mmol/L 4.0  3.5  3.9   Chloride 98 - 111 mmol/L 101  104  104   CO2 22 - 32 mmol/L 23  23  22    Calcium 8.9 - 10.3 mg/dL 8.7  8.8  8.7   Total Protein 6.5 - 8.1 g/dL 7.0  6.9  7.1   Total Bilirubin  0.3 - 1.2 mg/dL 0.3  0.5  0.5   Alkaline Phos 38 - 126 U/L 64  56  69   AST 15 - 41 U/L 19  14  18    ALT 0 - 44 U/L 11  10  13       Iron/TIBC/Ferritin/ %Sat    Component Value Date/Time   IRON 60 02/03/2022 0958   TIBC 391 02/03/2022 0958   FERRITIN 704 (H) 02/03/2022 0958   IRONPCTSAT 15 (L) 02/03/2022 1610      RADIOGRAPHIC STUDIES: I have personally reviewed the radiological images as listed and agreed with the findings in the report. MR CERVICAL SPINE WO CONTRAST  Result Date: 01/09/2023 CLINICAL DATA:  Initial evaluation for chronic myelopathy. Neck pain. EXAM: MRI CERVICAL SPINE WITHOUT CONTRAST TECHNIQUE: Multiplanar, multisequence MR imaging of the cervical spine was performed. No intravenous contrast was administered. COMPARISON:  None Available. FINDINGS: Alignment: Straightening of the normal cervical lordosis. No significant listhesis. Vertebrae: Vertebral body height maintained without acute or chronic fracture. Prior fusion  at C5-6. Bone marrow signal intensity within normal limits. No worrisome osseous lesions. No significant abnormal marrow edema. Cord: Patchy signal abnormality involving the cervical cord at the level of C5-6, consistent with chronic myelomalacia. Focal cord atrophy at this level. Posterior Fossa, vertebral arteries, paraspinal tissues: Age-related cerebral atrophy. Craniocervical junction within normal limits. Paraspinous soft tissues normal. Normal flow voids seen within the vertebral arteries bilaterally. Sphenoid sinus retention cyst noted. Disc levels: C2-C3: Central disc osteophyte complex indents and partially faces the ventral thecal sac. Moderate right with mild left facet arthrosis. Mild spinal stenosis. Mild left with severe right C3 foraminal stenosis. C3-C4: Degenerative vertebral disc space narrowing. Broad-based posterior disc osteophyte complex flattens and effaces the ventral thecal sac, asymmetric to the left. Superimposed facet and ligament flavum hypertrophy. Secondary cord flattening without visible cord signal changes. Severe spinal stenosis. Severe left with moderate right C4 foraminal narrowing. C4-C5: Degenerate intervertebral disc space narrowing with diffuse disc osteophyte complex. Broad posterior component flattens and effaces the ventral thecal sac. Superimposed facet and ligament flavum hypertrophy. Secondary cord flattening without cord signal changes. Severe spinal stenosis with severe bilateral C5 foraminal narrowing. C5-C6: Prior fusion. Endplate osseous ridging indents the ventral thecal sac with residual mild spinal stenosis. Foramina appear patent. C6-C7: Degenerative vertebral disc space narrowing. Central to left paracentral disc osteophyte complex indents the ventral thecal sac (series 108, image 23). Minimal cord flattening without cord signal changes. Mild spinal stenosis. Bilateral uncovertebral spurring with resultant moderate bilateral C7 foraminal stenosis. C7-T1:  Degenerative disc space narrowing with diffuse disc bulge. Endplate and uncovertebral spurring. Mild facet hypertrophy. No spinal stenosis. Mild to moderate left C8 foraminal narrowing. Right neural foramen remains patent. T1-2: Seen only on sagittal projection. Diffuse disc bulge with endplate spurring. No significant spinal stenosis. Mild to moderate bilateral foraminal narrowing. T2-3: Seen only on sagittal projection. Mild disc bulge with endplate spurring. No spinal stenosis. Mild to moderate bilateral foraminal narrowing. IMPRESSION: 1. Prior fusion at C5-6 with residual mild spinal stenosis. Patchy signal abnormality involving the cervical cord at this level, consistent with chronic myelomalacia. 2. Degenerative spondylosis and facet hypertrophy at C3-4 and C4-5 with resultant severe spinal stenosis, with moderate to severe bilateral C4 and C5 foraminal narrowing as above. 3. Degenerative disc osteophyte at C6-7 with resultant mild spinal stenosis, with moderate bilateral C7 foraminal narrowing. Electronically Signed   By: Rise Mu M.D.   On: 01/09/2023 20:56   CT Chest W Contrast  Result Date: 12/24/2022 CLINICAL DATA:  Restaging non-small cell lung cancer. * Tracking Code: BO * . EXAM: CT CHEST WITH CONTRAST TECHNIQUE: Multidetector CT imaging of the chest was performed during intravenous contrast administration. RADIATION DOSE REDUCTION: This exam was performed according to the departmental dose-optimization program which includes automated exposure control, adjustment of the mA and/or kV according to patient size and/or use of iterative reconstruction technique. CONTRAST:  75mL OMNIPAQUE IOHEXOL 300 MG/ML  SOLN COMPARISON:  CT angio chest 10/01/2022 and PET-CT from 07/03/2022. FINDINGS: Cardiovascular: The heart size appears within normal limits. Aortic atherosclerosis and coronary artery calcifications. No pericardial effusion. Mediastinum/Nodes: Thyroid gland, trachea, and esophagus  appear normal. No enlarged axillary, supraclavicular, mediastinal or hilar lymph nodes. Lungs/Pleura: No pleural effusion. The treated lung lesion within the left upper lobe with surrounding scarring and architectural distortion measures approximately 2.3 by 1.8 cm, image 64/2. On the PET-CT from 07/03/2022 this measured 2.3 x 1.5 cm. The previous FDG avid subpleural nodule abutting the oblique fissure of the left lung (adjacent to post treatment changes) is again noted measuring 7 mm, image 67/3. This is stable from 10/01/2022. On the PET-CT from 07/03/2022 this nodule was measured at 5 mm. New bandlike area of subpleural consolidation within the lateral and posterior left lower lobe is identified, image 69/3. No new lung nodules identified. Upper Abdomen: No acute abnormality. No mass or adenopathy identified. Musculoskeletal: No acute findings. Sclerotic lesions involving the T12 and L1 vertebra are stable compared with the previous exam. IMPRESSION: 1. Stable appearance of treated lung lesion within the left upper lobe with surrounding scarring and architectural distortion. 2. The previous FDG avid subpleural nodule abutting the oblique fissure of the left lung (adjacent to post treatment changes) is again noted measuring 7 mm. This is stable from 10/01/2022. On the PET-CT from 07/03/2022 this nodule was measured at 5 mm. 3. New bandlike area of subpleural consolidation within the lateral and posterior left lower lobe is identified. This is favored to represent post treatment change. 4. Stable sclerotic lesions involving the T12 and L1 vertebra. 5. Coronary artery calcifications. 6.  Aortic Atherosclerosis (ICD10-I70.0). Electronically Signed   By: Signa Kell M.D.   On: 12/24/2022 08:40

## 2023-01-12 NOTE — Assessment & Plan Note (Signed)
Recommend Zometa every 4- 6 weeks.-  hold off today due to possible upcoming surgery Continue calcium supplementation.  MRI lumbar with and without contrast showed  Stable remote superior endplate fracture of L2 and large Schmorl to noted involving L5. 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 PET scan shows stable bone lesions.  L4 fracture uptake likely benign- s/p nerve block - pending neurosurgery evaluation.  Hold off Zometa given possibility of upcoming back surgery.

## 2023-01-12 NOTE — Assessment & Plan Note (Signed)
Off Eliquis 2.5mg  BID due to concern of microbleeds in brain

## 2023-01-12 NOTE — Assessment & Plan Note (Signed)
Hold off Eliquis per neurology Dr. Barbaraann Cao 10/02/22 Repeat MRI brain-stable findings

## 2023-01-12 NOTE — Patient Instructions (Signed)

## 2023-01-12 NOTE — Assessment & Plan Note (Signed)
Back pain, MRI lumbar showed DJD, L4-L5 synovial cyst,PET scan showed L4 fracture Continue PRN percocet 7.5mg /325mg , fentanyl patch Q72 hours

## 2023-01-12 NOTE — Assessment & Plan Note (Signed)
Immunotherapy plan as listed above 

## 2023-01-13 ENCOUNTER — Ambulatory Visit: Payer: PPO | Admitting: Neurosurgery

## 2023-01-13 ENCOUNTER — Other Ambulatory Visit: Payer: Self-pay

## 2023-01-13 ENCOUNTER — Telehealth: Payer: Self-pay

## 2023-01-13 DIAGNOSIS — G959 Disease of spinal cord, unspecified: Secondary | ICD-10-CM

## 2023-01-13 DIAGNOSIS — M4802 Spinal stenosis, cervical region: Secondary | ICD-10-CM

## 2023-01-13 DIAGNOSIS — Z01818 Encounter for other preprocedural examination: Secondary | ICD-10-CM

## 2023-01-13 DIAGNOSIS — G9589 Other specified diseases of spinal cord: Secondary | ICD-10-CM

## 2023-01-13 LAB — T4: T4, Total: 7.4 ug/dL (ref 4.5–12.0)

## 2023-01-13 NOTE — Telephone Encounter (Signed)
Planned surgery: C3-5 anterior cervical discectomy and fusion   Surgery date: 02/09/23 at [facility]: [address], [place], [place] [address]) - you will find out your arrival time the business day before your surgery.   Pre-op appointment at [facility] Pre-admit Testing: we will call you with a date/time for this. If you are scheduled for an in person appointment, Pre-admit Testing is located on the first floor of the [facility] building, [address]. Please bring all prescriptions in the original prescription bottles to your appointment. During this appointment, they will advise you which medications you can take the morning of surgery, and which medications you will need to hold for surgery. Labs (such as blood work, EKG) may be done at your pre-op appointment. You are not required to fast for these labs. Should you need to change your pre-op appointment, please call Pre-admit testing at [phone].     Surgical clearance: we will send a clearance form to Dr Tanya Nones. They may wish to see you in their office prior to signing the clearance form. If so, they may call you to schedule an appointment.     NSAIDS (Non-steroidal anti-inflammatory drugs): because you are having a fusion, please avoid taking any NSAIDS (examples: ibuprofen, motrin, aleve, naproxen, meloxicam, diclofenac) for 3 months after surgery. Celebrex is an exception and is OK to take, if prescribed. Tylenol is not an NSAID.    Common restrictions after surgery: No bending, lifting, or twisting ("BLT"). Avoid lifting objects heavier than 10 pounds for the first 6 weeks after surgery. Where possible, avoid household activities that involve lifting, bending, reaching, pushing, or pulling such as laundry, vacuuming, grocery shopping, and childcare. Try to arrange for help from friends and family for these activities while you heal. Do not drive while taking  prescription pain medication. Weeks 6 through 12 after surgery: avoid lifting more than 25 pounds.    X-rays after surgery: Because you are having a fusion: for appointments after your 2 week follow-up: please arrive at the [facility] outpatient imaging center ([address], [place]) or [facility] one hour prior to your appointment for x-rays. This applies to every appointment after your 2 week follow-up. Failure to do so may result in your appointment being rescheduled.   How to contact us:  If you have any questions/concerns before or after surgery, you can reach [place] at [phone], or you can send a mychart message. We can be reached by phone or mychart 8am-4pm, Monday-Friday.  *Please note: Calls after 4pm are forwarded to a third party answering service. Mychart messages are not routinely monitored during evenings, weekends, and holidays. Please call our office to contact the answering service for urgent concerns during non-business hours.     If you have FMLA/disability paperwork, please drop it off or fax it to [phone], attention Patty.   Appointments/FMLA & disability paperwork: Joycelyn Rua, & Flonnie Hailstone Registered Nurse/Surgery scheduler: Royston Cowper Medical Assistants: Nash Mantis Physician Assistants: Joan Flores, PA-C, Manning Charity, PA-C & Drake Leach, PA-C Surgeons: Venetia Night, MD & Ernestine Mcmurray, MD

## 2023-01-13 NOTE — Progress Notes (Signed)
Referring Physician:  No referring provider defined for this encounter.  Primary Physician:  Donita Brooks, MD  History of Present Illness: 01/13/2023 Mr. Villeda presents today to review his cervical spine MRI findings.   12/30/2022 Mr. Luverne Rando is here today with a chief complaint of weakness in his left leg worse than his right leg.  This began a couple of years ago.  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  Since that time, he has had substantial weakness.  He is not the point where he has difficulty walking.  He can only walk with assistance.  He has not walked without assistance for approximately 4 months.  He reports losing significant muscle bulk.  He was diagnosed with lung cancer last year and underwent chemotherapy and radiation treatment.  His disease is currently stable.  He is also suffering from back pain, but that has been ongoing for many years.  He denies any shooting pains down his left leg.  He does have shooting pains in his back.  He has been referred today for evaluation.  He has been seen at an outside pain management site, where he has is being worked up for possible spinal cord stimulator placement.  Bowel/Bladder Dysfunction: none  Conservative measures:  Physical therapy: Had PT from home health. They were trying to strengthen the muscles but had to stop when he moved to wheelchair full time.   Multimodal medical therapy including regular antiinflammatories: Gabapentin, Tylenol,Oxycodone, Fentanyl  Injections: did receive ESI from Dr. Ethelene Hal at [facility]. Also has an ablation in May  Past Surgery: none  TAHJAE RUPPEL has no symptoms of cervical myelopathy.  The symptoms are causing a significant impact on the patient's life.   I have utilized the care everywhere function in epic to review the outside records available from external health systems.  Review of Systems:  A 10 point review of systems is negative,  except for the pertinent positives and negatives detailed in the HPI.  Past Medical History: Past Medical History:  Diagnosis Date   Allergy    BPH (benign prostatic hypertrophy)    Cancer (HCC)    Melanoma on Neck    2008   Carotid artery occlusion    CHF (congestive heart failure) (HCC)    Diabetes mellitus    type 2   ED (erectile dysfunction)    GERD (gastroesophageal reflux disease)    Hemorrhagic stroke (HCC) 06/2021   Hyperlipidemia    Hypertension    Lung cancer (HCC)    Neoplasm related pain    Retinopathy due to secondary DM [facility])     Past Surgical History: Past Surgical History:  Procedure Laterality Date   BRONCHIAL NEEDLE ASPIRATION BIOPSY  06/17/2021   Procedure: BRONCHIAL NEEDLE ASPIRATION BIOPSIES;  Surgeon: Lorin Glass, MD;  Location: [facility] ENDOSCOPY;  Service: Pulmonary;;   IR IMAGING GUIDED PORT INSERTION  08/05/2021   MELANOMA EXCISION  2008   Left side of neck   RADIOLOGY WITH ANESTHESIA N/A 04/05/2020   Procedure: MRI SPINE WITOUT CONTRAST;  Surgeon: Radiologist, Medication, MD;  Location: [facility] OR;  Service: Radiology;  Laterality: N/A;   RADIOLOGY WITH ANESTHESIA N/A 08/22/2021   Procedure: MRI BRAIN WITH AND WITHOUT CONTRAST  WITH ANESTHESIA;  Surgeon: Radiologist, Medication, MD;  Location: [facility] OR;  Service: Radiology;  Laterality: N/A;   RADIOLOGY WITH ANESTHESIA N/A 12/17/2021   Procedure: MRI BRAIN WITH AND WITHOUT CONTRAST WITH ANESTHESIA; MRI LUMBER WITH AND WITHOUT CONTRAST;  Surgeon: Radiologist, Medication, MD;  Location: [facility] OR;  Service: Radiology;  Laterality: N/A;   RADIOLOGY WITH ANESTHESIA N/A 04/08/2022   Procedure: MRI LUMBER SPINE WITH AND WITHOUT CONTRAST;  Surgeon: Radiologist, Medication, MD;  Location: [facility] OR;  Service: Radiology;  Laterality: N/A;   TONSILLECTOMY     VIDEO BRONCHOSCOPY WITH ENDOBRONCHIAL ULTRASOUND N/A 06/17/2021   Procedure: VIDEO BRONCHOSCOPY WITH ENDOBRONCHIAL ULTRASOUND;  Surgeon: Lorin Glass, MD;  Location: [facility]  ENDOSCOPY;  Service: Pulmonary;  Laterality: N/A;    Allergies: Allergies as of 01/13/2023 - Review Complete 01/12/2023  Allergen Reaction Noted   Niaspan [niacin]  10/28/2011    Medications:  Current Outpatient Medications:    ALPRAZolam (XANAX) 0.5 MG tablet, Take 1 tablet (0.5 mg total) by mouth at bedtime as needed for anxiety. (Patient not taking: Reported on 12/30/2022), Disp: 15 tablet, Rfl: 0   citalopram (CELEXA) 10 MG tablet, Take 1 tablet (10 mg total) by mouth daily., Disp: 30 tablet, Rfl: 3   docusate sodium (COLACE) 100 MG capsule, Take 100 mg by mouth 2 (two) times daily as needed for mild constipation., Disp: , Rfl:    fentaNYL (DURAGESIC) 75 MCG/HR, Place 1 patch onto the skin every 3 (three) days., Disp: 10 patch, Rfl: 0   finasteride (PROSCAR) 5 MG tablet, Take 1 tablet (5 mg total) by mouth daily., Disp: 30 tablet, Rfl: 0   gabapentin (NEURONTIN) 300 MG capsule, Take 2 capsules (600 mg total) by mouth 2 (two) times daily., Disp: 120 capsule, Rfl: 2   levETIRAcetam (KEPPRA) 500 MG tablet, Take 1 tablet (500 mg total) by mouth 2 (two) times daily., Disp: 60 tablet, Rfl: 5   megestrol (MEGACE) 40 MG tablet, Take 1 tablet (40 mg total) by mouth daily., Disp: 30 tablet, Rfl: 3   NOVOLOG FLEXPEN 100 UNIT/ML FlexPen, Inject 0-10 Units into the skin daily as needed for high blood sugar (BG > 200)., Disp: , Rfl:    Omeprazole 20 MG TBEC, Take 20 mg by mouth daily., Disp: , Rfl:    oxyCODONE-acetaminophen (PERCOCET) 7.5-325 MG tablet, Take 1 tablet by mouth every 4 (four) hours as needed for severe pain. Do not take with cough medication or other pain medication, Disp: 60 tablet, Rfl: 0   valsartan (DIOVAN) 80 MG tablet, Take 80 mg by mouth daily., Disp: , Rfl:  No current facility-administered medications for this visit.  Facility-Administered Medications Ordered in Other Visits:    heparin lock flush 100 UNIT/ML injection, , , ,   Social History: Social History   Tobacco  Use   Smoking status: Former    Current packs/day: 0.00    Types: Cigarettes    Quit date: 07/21/1990    Years since quitting: 32.5   Smokeless tobacco: Never  Vaping Use   Vaping status: Never Used  Substance Use Topics   Alcohol use: No   Drug use: No    Family Medical History: Family History  Problem Relation Age of Onset   COPD Mother    Heart disease Father    Heart disease Brother        MI at age 69   Stroke Neg Hx     Physical Examination: Telephone visit today  He continues to have balance issues   Medical Decision Making  Imaging: MRI L spine 10/02/2022 Disc levels:   L1-2: Disc desiccation with mild disc bulge. Mild facet spurring. No spinal stenosis. Foramina remain patent.   L2-3: Disc desiccation with mild disc bulge. No spinal  stenosis. Foramina remain patent.   L3-4: Degenerative intervertebral disc space narrowing with diffuse disc bulge. Associated reactive endplate spurring. Mild bilateral facet hypertrophy. Resultant mild left greater than right lateral recess stenosis. Central canal remains patent. Mild to moderate left with mild right L3 foraminal stenosis.   L4-5: Mild disc bulge with reactive endplate spurring. Mild to moderate facet hypertrophy. Resultant mild to moderate bilateral subarticular stenosis. Central canal remains patent. Mild to moderate left with moderate right L4 foraminal stenosis.   L5-S1: Retrolisthesis with degenerative intervertebral disc space narrowing. Central disc protrusion with slight inferior angulation and annular fissure indents the ventral thecal sac (series 12, image 33). Mild bilateral facet hypertrophy. Resultant mild bilateral subarticular stenosis. Central canal remains patent. Moderate bilateral L5 foraminal narrowing.   IMPRESSION: 1. No MRI evidence for acute infection or other abnormality within the lumbar spine. 2. Few subcentimeter T1 hypointense lesions about the visualized posterior iliac  wings. These correspond with small sclerotic foci seen on most recent CT from 04/08/2022, presumably reflecting patient's known osseous metastatic disease. These were not hypermetabolic on prior PET-CT from 16/12/9602. 3. No other evidence for metastatic disease within the lumbar spine itself. 4. Chronic L2 compression fracture, stable. Large Schmorl's node deformity with associated height loss at L5, also stable. 5. Multifactorial degenerative changes at L3-4 through L5-S1 with resultant mild to moderate bilateral subarticular stenosis, with mild to moderate bilateral L3 through L5 foraminal narrowing as above.     Electronically Signed   By: Rise Mu M.D.   On: 10/02/2022 05:22  MRI C spine 01/09/2023 IMPRESSION: 1. Prior fusion at C5-6 with residual mild spinal stenosis. Patchy signal abnormality involving the cervical cord at this level, consistent with chronic myelomalacia. 2. Degenerative spondylosis and facet hypertrophy at C3-4 and C4-5 with resultant severe spinal stenosis, with moderate to severe bilateral C4 and C5 foraminal narrowing as above. 3. Degenerative disc osteophyte at C6-7 with resultant mild spinal stenosis, with moderate bilateral C7 foraminal narrowing.     Electronically Signed   By: Rise Mu M.D.   On: 01/09/2023 20:56  I have personally reviewed the images and agree with the above interpretation.  Assessment and Plan: Mr. Alvarado is a pleasant 73 y.o. male with unexplained weakness in his left greater than right lower extremity.  He has significant balance issues.  He unfortunately has symptoms concerning for cervical myelopathy.  He has cervical stenosis on his MRI scan from 4 days ago.  He has severe stenosis at C3-4 and C4-5.    There is no role for conservative management for cervical myelopathy.  I recommended surgical intervention.  I think the options are C3-5 anterior cervical discectomy and fusion versus C3-4  laminoplasty.  I reviewed the pluses and minuses of the 2 different options, and he has chosen to move forward with C3-5 anterior cervical discectomy and fusion.    I discussed the planned procedure at length with the patient, including the risks, benefits, alternatives, and indications. The risks discussed include but are not limited to bleeding, infection, need for reoperation, spinal fluid leak, stroke, vision loss, anesthetic complication, coma, paralysis, and even death. We also discussed the possibility of post-operative dysphagia, vocal cord paralysis, and the risk of adjacent segment disease in the future. I also described in detail that improvement was not guaranteed.  The patient expressed understanding of these risks, and asked that we proceed with surgery. I described the surgery in layman's terms, and gave ample opportunity for questions, which were answered to  the best of my ability.  Independent of the above, he may be a candidate for spinal cord stimulator placement to help with his back pain.  If he has a positive trial, I will be happy to place a permanent device.  This visit was performed via telephone.  Patient location: home Provider location: office  I spent a total of 15 minutes non-face-to-face activities for this visit on the date of this encounter including review of current clinical condition and response to treatment.  The patient is aware of and accepts the limits of this telehealth visit.      Zeniah Briney K. Myer Haff MD, [facility] Neurosurgery

## 2023-01-15 ENCOUNTER — Telehealth: Payer: Self-pay | Admitting: Neurology

## 2023-01-15 NOTE — Telephone Encounter (Signed)
LVM on 11/13 to cancel appointment. Called pt back but he was asleep and family asked [place] to call back in a hour or two.

## 2023-01-21 ENCOUNTER — Other Ambulatory Visit: Payer: PPO

## 2023-01-21 ENCOUNTER — Inpatient Hospital Stay: Admission: RE | Admit: 2023-01-21 | Payer: PPO | Source: Ambulatory Visit

## 2023-01-21 ENCOUNTER — Other Ambulatory Visit: Payer: Self-pay | Admitting: Hospice and Palliative Medicine

## 2023-01-21 DIAGNOSIS — C3412 Malignant neoplasm of upper lobe, left bronchus or lung: Secondary | ICD-10-CM

## 2023-01-21 DIAGNOSIS — G893 Neoplasm related pain (acute) (chronic): Secondary | ICD-10-CM

## 2023-01-22 ENCOUNTER — Encounter: Payer: Self-pay | Admitting: Oncology

## 2023-01-22 MED ORDER — FENTANYL 75 MCG/HR TD PT72: 1.0000 | MEDICATED_PATCH | TRANSDERMAL | 0 refills | Status: DC

## 2023-01-22 MED ORDER — OXYCODONE-ACETAMINOPHEN 7.5-325 MG PO TABS: 1.0000 | ORAL_TABLET | ORAL | 0 refills | Status: DC | PRN

## 2023-01-28 ENCOUNTER — Other Ambulatory Visit: Payer: Self-pay

## 2023-01-28 ENCOUNTER — Encounter
Admission: RE | Admit: 2023-01-28 | Discharge: 2023-01-28 | Disposition: A | Discharge status: Home / Self Care | Payer: PPO | Source: Ambulatory Visit | Attending: Neurosurgery | Admitting: Neurosurgery

## 2023-01-28 ENCOUNTER — Inpatient Hospital Stay: Admission: RE | Admit: 2023-01-28 | Payer: PPO | Source: Ambulatory Visit

## 2023-01-28 VITALS — BP 133/76 | HR 88 | Resp 14 | Ht 67.0 in | Wt 130.0 lb

## 2023-01-28 DIAGNOSIS — D638 Anemia in other chronic diseases classified elsewhere: Secondary | ICD-10-CM

## 2023-01-28 DIAGNOSIS — E785 Hyperlipidemia, unspecified: Secondary | ICD-10-CM

## 2023-01-28 DIAGNOSIS — I5030 Unspecified diastolic (congestive) heart failure: Secondary | ICD-10-CM

## 2023-01-28 DIAGNOSIS — I10 Essential (primary) hypertension: Secondary | ICD-10-CM

## 2023-01-28 DIAGNOSIS — Z01810 Encounter for preprocedural cardiovascular examination: Secondary | ICD-10-CM | POA: Diagnosis not present

## 2023-01-28 DIAGNOSIS — I259 Chronic ischemic heart disease, unspecified: Secondary | ICD-10-CM

## 2023-01-28 DIAGNOSIS — E1169 Type 2 diabetes mellitus with other specified complication: Secondary | ICD-10-CM

## 2023-01-28 HISTORY — DX: Cerebral infarction, unspecified: I63.9

## 2023-01-28 HISTORY — DX: Anxiety disorder, unspecified: F41.9

## 2023-01-28 NOTE — Patient Instructions (Addendum)
Your procedure is scheduled on: Monday 02/09/23 To find out your arrival time, please call [phone] between 1PM - 3PM on:  Friday 02/06/23  Report to the Registration Desk on the 1st floor of the [facility]. Free Valet parking is available.  If your arrival time is 6:00 am, do not arrive before that time as the [facility] entrance doors do not open until 6:00 am.  REMEMBER: Instructions that are not followed completely may result in serious medical risk, up to and including death; or upon the discretion of your surgeon and anesthesiologist your surgery may need to be rescheduled.  Do not eat food after midnight the night before surgery.  No gum chewing or hard candies.  You may however, drink CLEAR liquids up to 2 hours before you are scheduled to arrive for your surgery. Do not drink anything within 2 hours of your scheduled arrival time.  Clear liquids include: - water   Type 1 and Type 2 diabetics should only drink water..  One week prior to surgery: Stop Anti-inflammatories (NSAIDS) such as Advil, Aleve, Ibuprofen, Motrin, Naproxen, Naprosyn and Aspirin based products such as Excedrin, Goody's Powder, BC Powder. You may however, continue to take Tylenol if needed for pain up until the day of surgery.  Stop ANY OVER THE COUNTER supplements or vitamins until after surgery.  Continue taking all prescribed medications   **Follow guidelines for insulin and diabetes medications** No insulin the morning of surgery and only half dose at bedtime night before  TAKE ONLY THESE MEDICATIONS THE MORNING OF SURGERY WITH A SIP OF WATER:  citalopram (CELEXA) 10 MG tablet  finasteride (PROSCAR) 5 MG tablet  gabapentin (NEURONTIN) 300 MG capsule  levETIRAcetam (KEPPRA) 500 MG tablet  omeprazole (PRILOSEC) 20 MG capsule Antacid (take one the night before and one on the morning of surgery - helps to prevent nausea after surgery.) You can take oxycodone if needed  No Alcohol for 24 hours  before or after surgery.  No Smoking including e-cigarettes for 24 hours before surgery.  No chewable tobacco products for at least 6 hours before surgery.  No nicotine patches on the day of surgery.  Do not use any "recreational" drugs for at least a week (preferably 2 weeks) before your surgery.  Please be advised that the combination of cocaine and anesthesia may have negative outcomes, up to and including death. If you test positive for cocaine, your surgery will be cancelled.  On the morning of surgery brush your teeth with toothpaste and water, you may rinse your mouth with mouthwash if you wish. Do not swallow any toothpaste or mouthwash.  Use CHG Soap or wipes as directed on instruction sheet. See instructions below  Do not wear lotions, powders, or perfumes on the day of surgery  Do not shave body hair from the neck down 48 hours before surgery.  Wear clean comfortable clothing (specific to your surgery type) to the hospital.  Do not wear jewelry, make-up, hairpins, clips or nail polish.  For welded (permanent) jewelry: bracelets, anklets, waist bands, etc.  Please have this removed prior to surgery.  If it is not removed, there is a chance that hospital personnel will need to cut it off on the day of surgery.  Contact lenses, hearing aids and dentures may not be worn into surgery.  Bring your C-PAP to the hospital in case you may have to spend the night.   Do not bring valuables to the hospital. [facility] is not responsible  for any missing/lost belongings or valuables.   Notify your doctor if there is any change in your medical condition (cold, fever, infection).  If you are being discharged the day of surgery, you will not be allowed to drive home. You will need a responsible individual to drive you home and stay with you for 24 hours after surgery.   If you are taking public transportation, you will need to have a responsible individual with you.  If you are being  admitted to the hospital overnight, leave your suitcase in the car. After surgery it may be brought to your room.  In case of increased patient census, it may be necessary for you, the patient, to continue your postoperative care in the Same Day Surgery department.  After surgery, you can help prevent lung complications by doing breathing exercises.  Take deep breaths and cough every 1-2 hours. Your doctor may order a device called an Incentive Spirometer to help you take deep breaths. When coughing or sneezing, hold a pillow firmly against your incision with both hands. This is called "splinting." Doing this helps protect your incision. It also decreases belly discomfort.  Surgery Visitation Policy:  Patients undergoing a surgery or procedure may have two family members or support persons with them as long as the person is not COVID-19 positive or experiencing its symptoms.   Inpatient Visitation:    Visiting hours are 7 a.m. to 8 p.m. Up to four visitors are allowed at one time in a patient room. The visitors may rotate out with other people during the day. One designated support person (adult) may remain overnight.  Please call the Pre-admissions Testing Dept. at [phone] if you have any questions about these instructions.    Pre-operative 5 CHG Bath Instructions   You can play a key role in reducing the risk of infection after surgery. Your skin needs to be as free of germs as possible. You can reduce the number of germs on your skin by washing with CHG (chlorhexidine gluconate) soap before surgery. CHG is an antiseptic soap that kills germs and continues to kill germs even after washing.   DO NOT use if you have an allergy to chlorhexidine/CHG or antibacterial soaps. If your skin becomes reddened or irritated, stop using the CHG and notify one of our RNs at [phone].   Please shower with the CHG soap starting 4 days before surgery using the following schedule:   Shower  with CHG daily for 5 days beginning Thursday 02/05/23 thru Monday 02/09/23  Please keep in mind the following:  DO NOT shave, including legs and underarms, starting the day of your first shower.   You may shave your face at any point before/day of surgery.  Place clean sheets on your bed the day you start using CHG soap. Use a clean washcloth (not used since being washed) for each shower. DO NOT sleep with pets once you start using the CHG.   CHG Shower Instructions:  If you choose to wash your hair and private area, wash first with your normal shampoo/soap.  After you use shampoo/soap, rinse your hair and body thoroughly to remove shampoo/soap residue.  Turn the water OFF and apply about 3 tablespoons (45 ml) of CHG soap to a CLEAN washcloth.  Apply CHG soap ONLY FROM YOUR NECK DOWN TO YOUR TOES (washing for 3-5 minutes)  DO NOT use CHG soap on face, private areas, open wounds, or sores.  Pay special attention to the area where your  surgery is being performed.  If you are having back surgery, having someone wash your back for you may be helpful. Wait 2 minutes after CHG soap is applied, then you may rinse off the CHG soap.  Pat dry with a clean towel  Put on clean clothes/pajamas   If you choose to wear lotion, please use ONLY the CHG-compatible lotions on the back of this paper.     Additional instructions for the day of surgery: DO NOT APPLY any lotions, deodorants, cologne, or perfumes.   Put on clean/comfortable clothes.  Brush your teeth.  Ask your nurse before applying any prescription medications to the skin.      CHG Compatible Lotions   Aveeno Moisturizing lotion  Cetaphil Moisturizing Cream  Cetaphil Moisturizing Lotion  Clairol Herbal Essence Moisturizing Lotion, Dry Skin  Clairol Herbal Essence Moisturizing Lotion, Extra Dry Skin  Clairol Herbal Essence Moisturizing Lotion, Normal Skin  Curel Age Defying Therapeutic Moisturizing Lotion with Alpha Hydroxy  Curel  Extreme Care Body Lotion  Curel Soothing Hands Moisturizing Hand Lotion  Curel Therapeutic Moisturizing Cream, Fragrance-Free  Curel Therapeutic Moisturizing Lotion, Fragrance-Free  Curel Therapeutic Moisturizing Lotion, Original Formula  Eucerin Daily Replenishing Lotion  Eucerin Dry Skin Therapy Plus Alpha Hydroxy Crme  Eucerin Dry Skin Therapy Plus Alpha Hydroxy Lotion  Eucerin Original Crme  Eucerin Original Lotion  Eucerin Plus Crme Eucerin Plus Lotion  Eucerin TriLipid Replenishing Lotion  Keri Anti-Bacterial Hand Lotion  Keri Deep Conditioning Original Lotion Dry Skin Formula Softly Scented  Keri Deep Conditioning Original Lotion, Fragrance Free Sensitive Skin Formula  Keri Lotion Fast Absorbing Fragrance Free Sensitive Skin Formula  Keri Lotion Fast Absorbing Softly Scented Dry Skin Formula  Keri Original Lotion  Keri Skin Renewal Lotion Keri Silky Smooth Lotion  Keri Silky Smooth Sensitive Skin Lotion  Nivea Body Creamy Conditioning Oil  Nivea Body Extra Enriched Teacher, adult education Moisturizing Lotion Nivea Crme  Nivea Skin Firming Lotion  NutraDerm 30 Skin Lotion  NutraDerm Skin Lotion  NutraDerm Therapeutic Skin Cream  NutraDerm Therapeutic Skin Lotion  ProShield Protective Hand Cream  Provon moisturizing lotion

## 2023-01-30 ENCOUNTER — Emergency Department (HOSPITAL_COMMUNITY)
Admission: EM | Admit: 2023-01-30 | Discharge: 2023-01-31 | Discharge status: Against Medical Advice | Payer: PPO | Attending: Emergency Medicine | Admitting: Emergency Medicine

## 2023-01-30 DIAGNOSIS — Z794 Long term (current) use of insulin: Secondary | ICD-10-CM | POA: Diagnosis not present

## 2023-01-30 DIAGNOSIS — Z20822 Contact with and (suspected) exposure to covid-19: Secondary | ICD-10-CM | POA: Diagnosis not present

## 2023-01-30 DIAGNOSIS — Z85118 Personal history of other malignant neoplasm of bronchus and lung: Secondary | ICD-10-CM | POA: Diagnosis not present

## 2023-01-30 DIAGNOSIS — N3000 Acute cystitis without hematuria: Secondary | ICD-10-CM | POA: Insufficient documentation

## 2023-01-30 DIAGNOSIS — I6523 Occlusion and stenosis of bilateral carotid arteries: Secondary | ICD-10-CM | POA: Diagnosis not present

## 2023-01-30 DIAGNOSIS — R4182 Altered mental status, unspecified: Secondary | ICD-10-CM | POA: Diagnosis not present

## 2023-01-30 DIAGNOSIS — A419 Sepsis, unspecified organism: Secondary | ICD-10-CM | POA: Diagnosis not present

## 2023-01-30 DIAGNOSIS — R251 Tremor, unspecified: Secondary | ICD-10-CM | POA: Diagnosis not present

## 2023-01-30 DIAGNOSIS — G459 Transient cerebral ischemic attack, unspecified: Secondary | ICD-10-CM | POA: Diagnosis not present

## 2023-01-30 DIAGNOSIS — R000 Tachycardia, unspecified: Secondary | ICD-10-CM | POA: Diagnosis not present

## 2023-01-30 DIAGNOSIS — R509 Fever, unspecified: Secondary | ICD-10-CM | POA: Diagnosis not present

## 2023-01-30 DIAGNOSIS — G9389 Other specified disorders of brain: Secondary | ICD-10-CM | POA: Diagnosis not present

## 2023-01-30 DIAGNOSIS — I10 Essential (primary) hypertension: Secondary | ICD-10-CM | POA: Diagnosis not present

## 2023-01-30 DIAGNOSIS — I6782 Cerebral ischemia: Secondary | ICD-10-CM | POA: Diagnosis not present

## 2023-01-30 DIAGNOSIS — I4510 Unspecified right bundle-branch block: Secondary | ICD-10-CM | POA: Diagnosis not present

## 2023-01-30 DIAGNOSIS — R918 Other nonspecific abnormal finding of lung field: Secondary | ICD-10-CM | POA: Diagnosis not present

## 2023-01-30 NOTE — ED Triage Notes (Signed)
Patient BIB [facility] from home. Wife called for concerns of stroke, AMS, and tremors. Patient AOX2, unsure of baseline. L sided weakness for prior stroke. Only answering yes/no questions. Patient febrile and tachycardic on arrival. Arrives on 1 L of 02 for decreased O2 sat. EMS reports between 85-95%. CBG 156. 20 G LAC.

## 2023-01-31 ENCOUNTER — Other Ambulatory Visit: Payer: Self-pay

## 2023-01-31 ENCOUNTER — Emergency Department (HOSPITAL_COMMUNITY): Payer: PPO

## 2023-01-31 ENCOUNTER — Encounter (HOSPITAL_COMMUNITY): Payer: Self-pay | Admitting: Emergency Medicine

## 2023-01-31 DIAGNOSIS — R4182 Altered mental status, unspecified: Secondary | ICD-10-CM | POA: Diagnosis not present

## 2023-01-31 DIAGNOSIS — R251 Tremor, unspecified: Secondary | ICD-10-CM | POA: Diagnosis not present

## 2023-01-31 DIAGNOSIS — I6523 Occlusion and stenosis of bilateral carotid arteries: Secondary | ICD-10-CM | POA: Diagnosis not present

## 2023-01-31 DIAGNOSIS — R509 Fever, unspecified: Secondary | ICD-10-CM | POA: Diagnosis not present

## 2023-01-31 DIAGNOSIS — G9389 Other specified disorders of brain: Secondary | ICD-10-CM | POA: Diagnosis not present

## 2023-01-31 DIAGNOSIS — R918 Other nonspecific abnormal finding of lung field: Secondary | ICD-10-CM | POA: Diagnosis not present

## 2023-01-31 DIAGNOSIS — I6782 Cerebral ischemia: Secondary | ICD-10-CM | POA: Diagnosis not present

## 2023-01-31 LAB — CBC WITH DIFFERENTIAL/PLATELET
Abs Immature Granulocytes: 0.05 10*3/uL (ref 0.00–0.07)
Basophils Absolute: 0 10*3/uL (ref 0.0–0.1)
Basophils Relative: 0 %
Eosinophils Absolute: 0.2 10*3/uL (ref 0.0–0.5)
Eosinophils Relative: 1 %
HCT: 39.4 % (ref 39.0–52.0)
Hemoglobin: 12.4 g/dL — ABNORMAL LOW (ref 13.0–17.0)
Immature Granulocytes: 0 %
Lymphocytes Relative: 5 %
Lymphs Abs: 0.6 10*3/uL — ABNORMAL LOW (ref 0.7–4.0)
MCH: 30.3 pg (ref 26.0–34.0)
MCHC: 31.5 g/dL (ref 30.0–36.0)
MCV: 96.3 fL (ref 80.0–100.0)
Monocytes Absolute: 0.8 10*3/uL (ref 0.1–1.0)
Monocytes Relative: 7 %
Neutro Abs: 9.9 10*3/uL — ABNORMAL HIGH (ref 1.7–7.7)
Neutrophils Relative %: 87 %
Platelets: 205 10*3/uL (ref 150–400)
RBC: 4.09 MIL/uL — ABNORMAL LOW (ref 4.22–5.81)
RDW: 14 % (ref 11.5–15.5)
WBC: 11.5 10*3/uL — ABNORMAL HIGH (ref 4.0–10.5)
nRBC: 0 % (ref 0.0–0.2)

## 2023-01-31 LAB — COMPREHENSIVE METABOLIC PANEL
ALT: 9 U/L (ref 0–44)
AST: 15 U/L (ref 15–41)
Albumin: 3.7 g/dL (ref 3.5–5.0)
Alkaline Phosphatase: 66 U/L (ref 38–126)
Anion gap: 11 (ref 5–15)
BUN: 16 mg/dL (ref 8–23)
CO2: 26 mmol/L (ref 22–32)
Calcium: 9.2 mg/dL (ref 8.9–10.3)
Chloride: 100 mmol/L (ref 98–111)
Creatinine, Ser: 1 mg/dL (ref 0.61–1.24)
GFR, Estimated: >60 mL/min (ref >60)
Glucose, Bld: 168 mg/dL — ABNORMAL HIGH (ref 70–99)
Potassium: 3.7 mmol/L (ref 3.5–5.1)
Sodium: 137 mmol/L (ref 135–145)
Total Bilirubin: 0.5 mg/dL (ref <1.2)
Total Protein: 7 g/dL (ref 6.5–8.1)

## 2023-01-31 LAB — URINALYSIS, W/ REFLEX TO CULTURE (INFECTION SUSPECTED)
Bilirubin Urine: NEGATIVE
Glucose, UA: NEGATIVE mg/dL
Ketones, ur: NEGATIVE mg/dL
Nitrite: POSITIVE — AB
Protein, ur: 100 mg/dL — AB
RBC / HPF: >50 RBC/hpf (ref 0–5)
Specific Gravity, Urine: 1.015 (ref 1.005–1.030)
WBC, UA: >50 WBC/hpf (ref 0–5)
pH: 6 (ref 5.0–8.0)

## 2023-01-31 LAB — PROTIME-INR
INR: 1 (ref 0.8–1.2)
Prothrombin Time: 13.5 s (ref 11.4–15.2)

## 2023-01-31 LAB — LACTIC ACID, PLASMA
Lactic Acid, Venous: 2.4 mmol/L (ref 0.5–1.9)
Lactic Acid, Venous: 1.7 mmol/L (ref 0.5–1.9)

## 2023-01-31 LAB — RESP PANEL BY RT-PCR (RSV, FLU A&B, COVID)  RVPGX2
Influenza A by PCR: NEGATIVE
Influenza B by PCR: NEGATIVE
Resp Syncytial Virus by PCR: NEGATIVE
SARS Coronavirus 2 by RT PCR: NEGATIVE

## 2023-01-31 LAB — APTT: aPTT: 23 s — ABNORMAL LOW (ref 24–36)

## 2023-01-31 MED ORDER — ACETAMINOPHEN 500 MG PO TABS
1000.0000 mg | ORAL_TABLET | Freq: Once | ORAL | Status: AC
  Administered 2023-01-31: 1000 mg via ORAL
  Filled 2023-01-31: qty 2

## 2023-01-31 MED ORDER — SODIUM CHLORIDE 0.9 % IV SOLN
2.0000 g | Freq: Once | INTRAVENOUS | Status: AC
  Administered 2023-01-31: 2 g via INTRAVENOUS
  Filled 2023-01-31: qty 12.5

## 2023-01-31 MED ORDER — VANCOMYCIN HCL IN DEXTROSE 1-5 GM/200ML-% IV SOLN
1000.0000 mg | Freq: Once | INTRAVENOUS | Status: AC
  Administered 2023-01-31: 1000 mg via INTRAVENOUS
  Filled 2023-01-31: qty 200

## 2023-01-31 MED ORDER — HYDROMORPHONE HCL 1 MG/ML IJ SOLN
0.5000 mg | Freq: Once | INTRAMUSCULAR | Status: AC
  Administered 2023-01-31: 0.5 mg via INTRAVENOUS
  Filled 2023-01-31: qty 0.5

## 2023-01-31 MED ORDER — LACTATED RINGERS IV SOLN: INTRAVENOUS | Status: DC

## 2023-01-31 MED ORDER — METRONIDAZOLE 500 MG/100ML IV SOLN
500.0000 mg | Freq: Once | INTRAVENOUS | Status: AC
  Administered 2023-01-31: 500 mg via INTRAVENOUS
  Filled 2023-01-31: qty 100

## 2023-01-31 MED ORDER — CEPHALEXIN 500 MG PO CAPS: 500.0000 mg | ORAL_CAPSULE | Freq: Four times a day (QID) | ORAL | 0 refills | Status: DC

## 2023-01-31 NOTE — ED Notes (Signed)
Patient transported to CT 

## 2023-01-31 NOTE — ED Notes (Signed)
Arrived in patient's room to see that he was visibly upset, pulling at IV lines, becoming verbally aggressive with nursing staff and wife at bedside stating that he wanted to leave. Patient Aox3 at this time,however stays with wife at home who cares for him. Patient's wife does not agree with patients wishes to leave. Notified Dr. Clayborne Dana of patients request to leave. Dr. Clayborne Dana states patient will be leaving AMA. Patient currently holding in ED for IP bed. Husband unable to sign AMA paperwork and wife refusing because she is not in agreeance to patient leaving. IV's removed, removed from monitoring equipment, dressed and wheeled out to wife in care without incident. Strict return education and notified of Rx at pharmacy for abx admin.

## 2023-01-31 NOTE — ED Notes (Addendum)
Emptied urine from condom cath. bag to obtain a new sample.

## 2023-01-31 NOTE — Progress Notes (Signed)
ED Pharmacy Antibiotic Sign Off An antibiotic consult was received from an ED provider for Vancomycin and Cefepime  per pharmacy dosing for sepsis. A chart review was completed to assess appropriateness.   The following one time order(s) were placed:  Vancomycin 1000 mg IV Cefepime 2 g IV   Further antibiotic and/or antibiotic pharmacy consults should be ordered by the admitting provider if indicated.   Thank you for allowing pharmacy to be a part of this patient's care.   Eddie Candle, [facility]  Clinical Pharmacist 01/31/23 4:08 AM

## 2023-01-31 NOTE — Sepsis Progress Note (Signed)
Elink monitoring for the code sepsis protocol.  

## 2023-01-31 NOTE — ED Provider Notes (Addendum)
[place] EMERGENCY DEPARTMENT AT [facility] Provider Note   CSN: [id number] Arrival date & time: 01/30/23  2341     History  Chief Complaint  Patient presents with   Altered Mental Status    Micheal Donovan is a 72 y.o. male.  72 year old male with a history of lung cancer that metastasized on immune modulators that presents to the ER today secondary to shaking episodes, altered mental status and fever.  This started earlier today.  Has a decreased appetite.  The wife shows me pictures of him having what looks like rigors.  He is conscious during this.  Does have a history of epilepsy but absence seizure's.  He is prone to having urinary tract infections.  He also has a sacral ulcer.  No recent cough but does cough while in the room with dark yellow thick productive cough.  No diarrhea or constipation.  No other associated symptoms.   Altered Mental Status      Home Medications Prior to Admission medications   Medication Sig Start Date End Date Taking? Authorizing Provider  ALPRAZolam Prudy Feeler) 0.5 MG tablet Take 1 tablet (0.5 mg total) by mouth at bedtime as needed for anxiety. 11/19/22   Borders, Daryl Eastern, NP  citalopram (CELEXA) 10 MG tablet Take 1 tablet (10 mg total) by mouth daily. 11/19/22   Borders, Daryl Eastern, NP  docusate sodium (COLACE) 100 MG capsule Take 100 mg by mouth 2 (two) times daily as needed for mild constipation.    [provider]  fentaNYL (DURAGESIC) 75 MCG/HR Place 1 patch onto the skin every 3 (three) days. 01/22/23   Borders, Daryl Eastern, NP  finasteride (PROSCAR) 5 MG tablet Take 1 tablet (5 mg total) by mouth daily. 10/07/22   Alford Highland, MD  gabapentin (NEURONTIN) 300 MG capsule Take 2 capsules (600 mg total) by mouth 2 (two) times daily. 12/12/22   Vaslow, Georgeanna Lea, MD  insulin glargine (LANTUS) 100 UNIT/ML injection Inject 0-30 Units into the skin daily as needed (High Blood Sugar over 150).    [provider]   levETIRAcetam (KEPPRA) 500 MG tablet Take 1 tablet (500 mg total) by mouth 2 (two) times daily. 12/12/22   Henreitta Leber, MD  megestrol (MEGACE) 40 MG tablet Take 1 tablet (40 mg total) by mouth daily. 09/22/22   Donita Brooks, MD  NOVOLOG FLEXPEN 100 UNIT/ML FlexPen Inject 0-10 Units into the skin daily as needed for high blood sugar (BG > 200).    [provider]  omeprazole (PRILOSEC) 20 MG capsule Take 20 mg by mouth daily.    [provider]  oxyCODONE-acetaminophen (PERCOCET) 7.5-325 MG tablet Take 1 tablet by mouth every 4 (four) hours as needed for severe pain (pain score 7-10). Do not take with cough medication or other pain medication 01/22/23   Borders, Daryl Eastern, NP  valsartan (DIOVAN) 80 MG tablet Take 80 mg by mouth daily. 12/08/22   [provider]  prochlorperazine (COMPAZINE) 10 MG tablet Take 1 tablet (10 mg total) by mouth every 6 (six) hours as needed (Nausea or vomiting). 07/23/21 08/06/21  [name], MD      Allergies    Niaspan [niacin]    Review of Systems   Review of Systems  Physical Exam Updated Vital Signs BP 116/68   Pulse 85   Temp 99.8 F (37.7 C) (Oral)   Resp 14   Ht 5\' 7"  (1.702 m)   Wt 59 kg   SpO2  95%   BMI 20.37 kg/m  Physical Exam Vitals and nursing note reviewed.  Constitutional:      Appearance: He is well-developed.  HENT:     Head: Normocephalic and atraumatic.     Mouth/Throat:     Mouth: Mucous membranes are dry.  Eyes:     Pupils: Pupils are equal, round, and reactive to light.  Cardiovascular:     Rate and Rhythm: Tachycardia present.  Pulmonary:     Effort: Pulmonary effort is normal. No respiratory distress.  Abdominal:     General: Abdomen is flat. There is no distension.  Musculoskeletal:        General: Normal range of motion.     Cervical back: Normal range of motion.  Skin:    General: Skin is warm and dry.     Comments: Sacral ulcer mild erythema and a scab but no obvious penetrating  ulcer  Neurological:     Mental Status: He is alert.     ED Results / Procedures / Treatments   Labs (all labs ordered are listed, but only abnormal results are displayed) Labs Reviewed  LACTIC ACID, PLASMA - Abnormal; Notable for the following components:      Result Value   Lactic Acid, Venous 2.4 (*)    All other components within normal limits  COMPREHENSIVE METABOLIC PANEL - Abnormal; Notable for the following components:   Glucose, Bld 168 (*)    All other components within normal limits  CBC WITH DIFFERENTIAL/PLATELET - Abnormal; Notable for the following components:   WBC 11.5 (*)    RBC 4.09 (*)    Hemoglobin 12.4 (*)    Neutro Abs 9.9 (*)    Lymphs Abs 0.6 (*)    All other components within normal limits  APTT - Abnormal; Notable for the following components:   aPTT 23 (*)    All other components within normal limits  URINALYSIS, W/ REFLEX TO CULTURE (INFECTION SUSPECTED) - Abnormal; Notable for the following components:   APPearance CLOUDY (*)    Hgb urine dipstick MODERATE (*)    Protein, ur 100 (*)    Nitrite POSITIVE (*)    Leukocytes,Ua LARGE (*)    Bacteria, UA RARE (*)    All other components within normal limits  RESP PANEL BY RT-PCR (RSV, FLU A&B, COVID)  RVPGX2  CULTURE, BLOOD (ROUTINE X 2)  CULTURE, BLOOD (ROUTINE X 2)  URINE CULTURE  LACTIC ACID, PLASMA  PROTIME-INR  LEVETIRACETAM LEVEL    EKG None  Radiology CT Head Wo Contrast  Result Date: 01/31/2023 CLINICAL DATA:  Tremors and altered mental status. EXAM: CT HEAD WITHOUT CONTRAST TECHNIQUE: Contiguous axial images were obtained from the base of the skull through the vertex without intravenous contrast. RADIATION DOSE REDUCTION: This exam was performed according to the departmental dose-optimization program which includes automated exposure control, adjustment of the mA and/or kV according to patient size and/or use of iterative reconstruction technique. COMPARISON:  None Available.  FINDINGS: Brain: There is mild cerebral atrophy with widening of the extra-axial spaces and ventricular dilatation. There are areas of decreased attenuation within the white matter tracts of the supratentorial brain, consistent with microvascular disease changes. A stable, chronic 12 mm x 6 mm x 14 mm area of calcification and adjacent encephalomalacia is seen within the cerebellum on the left. Vascular: Mild to moderate severity calcification of the bilateral cavernous carotid arteries is seen. Skull: Normal. Negative for fracture or focal lesion. Sinuses/Orbits: There is mild sphenoid sinus mucosal  thickening. Other: A small, stable left frontal scalp lipoma is noted. IMPRESSION: 1. Generalized cerebral atrophy with chronic white matter small vessel ischemic changes. 2. No acute intracranial abnormality. 3. Mild sphenoid sinus disease. Electronically Signed   By: Aram Candela M.D.   On: 01/31/2023 01:46   DG Chest Port 1 View  Result Date: 01/31/2023 CLINICAL DATA:  Question of sepsis to evaluate for abnormality. Altered mental status with tremors. Left-sided weakness. Fever and tachycardia. EXAM: PORTABLE CHEST 1 VIEW COMPARISON:  02/13/2022 FINDINGS: Power port type central venous catheter with tip over the cavoatrial junction region. No pneumothorax. Linear scarring in the left mid lung corresponding to scarring and nodularity on prior CT 12/19/2022. No significant change. Right lung is clear. No pleural effusions. No pneumothorax. Mediastinal contours appear intact. Calcification of the aorta. Degenerative changes in the spine and shoulders. IMPRESSION: 1. Linear scarring corresponding to known left lung mass and treatment changes. 2. No acute consolidation. Electronically Signed   By: Burman Nieves M.D.   On: 01/31/2023 00:50    Procedures .Critical Care  Performed by: Marily Memos, MD Authorized by: Marily Memos, MD   Critical care provider statement:    Critical care time (minutes):   30   Critical care was necessary to treat or prevent imminent or life-threatening deterioration of the following conditions:  Sepsis   Critical care was time spent personally by me on the following activities:  Development of treatment plan with patient or surrogate, discussions with consultants, evaluation of patient's response to treatment, examination of patient, ordering and review of laboratory studies, ordering and review of radiographic studies, ordering and performing treatments and interventions, pulse oximetry, re-evaluation of patient's condition and review of old charts     Medications Ordered in ED Medications  lactated ringers infusion ( Intravenous New Bag/Given 01/31/23 0059)  ceFEPIme (MAXIPIME) 2 g in sodium chloride 0.9 % 100 mL IVPB (0 g Intravenous Stopped 01/31/23 0158)  metroNIDAZOLE (FLAGYL) IVPB 500 mg (0 mg Intravenous Stopped 01/31/23 0222)  vancomycin (VANCOCIN) IVPB 1000 mg/200 mL premix (0 mg Intravenous Stopped 01/31/23 0316)  HYDROmorphone (DILAUDID) injection 0.5 mg (0.5 mg Intravenous Given 01/31/23 0109)  acetaminophen (TYLENOL) tablet 1,000 mg (1,000 mg Oral Given 01/31/23 0201)    ED Course/ Medical Decision Making/ A&P                                 Medical Decision Making Amount and/or Complexity of Data Reviewed Labs: ordered. Radiology: ordered. ECG/medicine tests: ordered.  Risk OTC drugs. Prescription drug management. Decision regarding hospitalization.   Patient sepsis on arrival and code sepsis activated.  Broad-spectrum antibiotics given secondary to unclear source.  Fever improved with Tylenol.  Ultimately found to have a likely urinary tract infection.  Cultures already been drawn.  Antibiotics already been given.  Wife thinks that he still not liking himself and still bit altered and weaker than normal so we will discuss with hospitalist about observation.  Lactic acid was slightly high but improved with fluids.  No hypotension.  Heart  rate improved with fever.  Final Clinical Impression(s) / ED Diagnoses Final diagnoses:  Sepsis, due to unspecified organism, unspecified whether acute organ dysfunction present [facility])  Acute cystitis without hematuria    Rx / DC Orders ED Discharge Orders     None         Eleyna Brugh, Barbara Cower, MD 01/31/23 [phone]   Patient refusing to be admitted. His  mind has cleared up a little and his wife is willing to help take care of him at home. I still don't think it is the best plan so nursing will AMA with them. Rx for abx sent to pharmacy. Encouraged return if he changes his mind and especially if he starts feeling unwell again.    Marily Memos, MD 01/31/23 [phone]

## 2023-02-01 LAB — URINE CULTURE

## 2023-02-02 ENCOUNTER — Inpatient Hospital Stay (HOSPITAL_BASED_OUTPATIENT_CLINIC_OR_DEPARTMENT_OTHER): Payer: PPO | Admitting: Oncology

## 2023-02-02 ENCOUNTER — Encounter: Payer: Self-pay | Admitting: Oncology

## 2023-02-02 ENCOUNTER — Inpatient Hospital Stay: Payer: PPO | Attending: Radiation Oncology

## 2023-02-02 ENCOUNTER — Inpatient Hospital Stay: Payer: PPO

## 2023-02-02 VITALS — BP 142/116 | HR 83 | Temp 97.0°F | Resp 18 | Wt 130.8 lb

## 2023-02-02 DIAGNOSIS — E876 Hypokalemia: Secondary | ICD-10-CM

## 2023-02-02 DIAGNOSIS — E11319 Type 2 diabetes mellitus with unspecified diabetic retinopathy without macular edema: Secondary | ICD-10-CM | POA: Insufficient documentation

## 2023-02-02 DIAGNOSIS — M47817 Spondylosis without myelopathy or radiculopathy, lumbosacral region: Secondary | ICD-10-CM | POA: Diagnosis not present

## 2023-02-02 DIAGNOSIS — I110 Hypertensive heart disease with heart failure: Secondary | ICD-10-CM | POA: Diagnosis not present

## 2023-02-02 DIAGNOSIS — I619 Nontraumatic intracerebral hemorrhage, unspecified: Secondary | ICD-10-CM | POA: Diagnosis not present

## 2023-02-02 DIAGNOSIS — M549 Dorsalgia, unspecified: Secondary | ICD-10-CM | POA: Insufficient documentation

## 2023-02-02 DIAGNOSIS — M79605 Pain in left leg: Secondary | ICD-10-CM | POA: Insufficient documentation

## 2023-02-02 DIAGNOSIS — C7931 Secondary malignant neoplasm of brain: Secondary | ICD-10-CM | POA: Insufficient documentation

## 2023-02-02 DIAGNOSIS — Z5112 Encounter for antineoplastic immunotherapy: Secondary | ICD-10-CM | POA: Insufficient documentation

## 2023-02-02 DIAGNOSIS — N390 Urinary tract infection, site not specified: Secondary | ICD-10-CM | POA: Diagnosis not present

## 2023-02-02 DIAGNOSIS — C3412 Malignant neoplasm of upper lobe, left bronchus or lung: Secondary | ICD-10-CM

## 2023-02-02 DIAGNOSIS — E1140 Type 2 diabetes mellitus with diabetic neuropathy, unspecified: Secondary | ICD-10-CM | POA: Diagnosis not present

## 2023-02-02 DIAGNOSIS — I509 Heart failure, unspecified: Secondary | ICD-10-CM | POA: Insufficient documentation

## 2023-02-02 DIAGNOSIS — C7951 Secondary malignant neoplasm of bone: Secondary | ICD-10-CM | POA: Insufficient documentation

## 2023-02-02 DIAGNOSIS — I951 Orthostatic hypotension: Secondary | ICD-10-CM | POA: Insufficient documentation

## 2023-02-02 DIAGNOSIS — I700 Atherosclerosis of aorta: Secondary | ICD-10-CM | POA: Insufficient documentation

## 2023-02-02 DIAGNOSIS — E785 Hyperlipidemia, unspecified: Secondary | ICD-10-CM | POA: Insufficient documentation

## 2023-02-02 DIAGNOSIS — Z87891 Personal history of nicotine dependence: Secondary | ICD-10-CM | POA: Insufficient documentation

## 2023-02-02 DIAGNOSIS — M47812 Spondylosis without myelopathy or radiculopathy, cervical region: Secondary | ICD-10-CM | POA: Insufficient documentation

## 2023-02-02 DIAGNOSIS — N400 Enlarged prostate without lower urinary tract symptoms: Secondary | ICD-10-CM | POA: Diagnosis not present

## 2023-02-02 DIAGNOSIS — R590 Localized enlarged lymph nodes: Secondary | ICD-10-CM | POA: Diagnosis not present

## 2023-02-02 DIAGNOSIS — Z79899 Other long term (current) drug therapy: Secondary | ICD-10-CM | POA: Insufficient documentation

## 2023-02-02 DIAGNOSIS — M48061 Spinal stenosis, lumbar region without neurogenic claudication: Secondary | ICD-10-CM | POA: Insufficient documentation

## 2023-02-02 DIAGNOSIS — I2699 Other pulmonary embolism without acute cor pulmonale: Secondary | ICD-10-CM | POA: Insufficient documentation

## 2023-02-02 DIAGNOSIS — K219 Gastro-esophageal reflux disease without esophagitis: Secondary | ICD-10-CM | POA: Insufficient documentation

## 2023-02-02 DIAGNOSIS — Z794 Long term (current) use of insulin: Secondary | ICD-10-CM | POA: Insufficient documentation

## 2023-02-02 DIAGNOSIS — G893 Neoplasm related pain (acute) (chronic): Secondary | ICD-10-CM | POA: Insufficient documentation

## 2023-02-02 DIAGNOSIS — Z8673 Personal history of transient ischemic attack (TIA), and cerebral infarction without residual deficits: Secondary | ICD-10-CM | POA: Insufficient documentation

## 2023-02-02 DIAGNOSIS — Z87440 Personal history of urinary (tract) infections: Secondary | ICD-10-CM | POA: Insufficient documentation

## 2023-02-02 DIAGNOSIS — Z923 Personal history of irradiation: Secondary | ICD-10-CM | POA: Insufficient documentation

## 2023-02-02 DIAGNOSIS — Z85820 Personal history of malignant melanoma of skin: Secondary | ICD-10-CM | POA: Insufficient documentation

## 2023-02-02 LAB — CBC WITH DIFFERENTIAL/PLATELET
Abs Immature Granulocytes: 0.02 10*3/uL (ref 0.00–0.07)
Basophils Absolute: 0 10*3/uL (ref 0.0–0.1)
Basophils Relative: 0 %
Eosinophils Absolute: 0.2 10*3/uL (ref 0.0–0.5)
Eosinophils Relative: 3 %
HCT: 34 % — ABNORMAL LOW (ref 39.0–52.0)
Hemoglobin: 11.3 g/dL — ABNORMAL LOW (ref 13.0–17.0)
Immature Granulocytes: 0 %
Lymphocytes Relative: 8 %
Lymphs Abs: 0.6 10*3/uL — ABNORMAL LOW (ref 0.7–4.0)
MCH: 30.9 pg (ref 26.0–34.0)
MCHC: 33.2 g/dL (ref 30.0–36.0)
MCV: 92.9 fL (ref 80.0–100.0)
Monocytes Absolute: 0.6 10*3/uL (ref 0.1–1.0)
Monocytes Relative: 7 %
Neutro Abs: 6.4 10*3/uL (ref 1.7–7.7)
Neutrophils Relative %: 82 %
Platelets: 254 10*3/uL (ref 150–400)
RBC: 3.66 MIL/uL — ABNORMAL LOW (ref 4.22–5.81)
RDW: 13.2 % (ref 11.5–15.5)
WBC: 7.8 10*3/uL (ref 4.0–10.5)
nRBC: 0 % (ref 0.0–0.2)

## 2023-02-02 LAB — COMPREHENSIVE METABOLIC PANEL
ALT: 9 U/L (ref 0–44)
AST: 12 U/L — ABNORMAL LOW (ref 15–41)
Albumin: 3.4 g/dL — ABNORMAL LOW (ref 3.5–5.0)
Alkaline Phosphatase: 52 U/L (ref 38–126)
Anion gap: 10 (ref 5–15)
BUN: 17 mg/dL (ref 8–23)
CO2: 26 mmol/L (ref 22–32)
Calcium: 8.3 mg/dL — ABNORMAL LOW (ref 8.9–10.3)
Chloride: 102 mmol/L (ref 98–111)
Creatinine, Ser: 0.93 mg/dL (ref 0.61–1.24)
GFR, Estimated: >60 mL/min (ref >60)
Glucose, Bld: 135 mg/dL — ABNORMAL HIGH (ref 70–99)
Potassium: 3.2 mmol/L — ABNORMAL LOW (ref 3.5–5.1)
Sodium: 138 mmol/L (ref 135–145)
Total Bilirubin: 0.6 mg/dL (ref <1.2)
Total Protein: 6.4 g/dL — ABNORMAL LOW (ref 6.5–8.1)

## 2023-02-02 MED ORDER — POTASSIUM CHLORIDE CRYS ER 20 MEQ PO TBCR: 20.0000 meq | EXTENDED_RELEASE_TABLET | Freq: Every day | ORAL | 0 refills | Status: DC

## 2023-02-02 MED ORDER — HEPARIN SOD (PORK) LOCK FLUSH 100 UNIT/ML IV SOLN
500.0000 [IU] | Freq: Once | INTRAVENOUS | Status: AC
  Administered 2023-02-02: 500 [IU] via INTRAVENOUS
  Filled 2023-02-02: qty 5

## 2023-02-02 NOTE — Assessment & Plan Note (Signed)
Off Eliquis 2.5mg  BID due to concern of microbleeds in brain

## 2023-02-02 NOTE — Progress Notes (Signed)
Hematology/Oncology Progress note [phone] [phone]       CHIEF COMPLAINTS/REASON FOR VISIT:  Metastatic non-small cell lung cancer  ASSESSMENT & PLAN:   Cancer Staging  Primary non-small cell carcinoma of upper lobe of left lung (HCC) Staging form: Lung, AJCC 8th Edition - Clinical: Stage IV (cT2b, cN1, cM1) - Signed by [name], MD on 08/06/2021   Primary non-small cell carcinoma of upper lobe of left lung (HCC) Stage IV lung adenocarcinoma with brain and bone metastasis.  S/p lung radiation to left lung.  12/19/2022, CT chest stable disease.  Labs are reviewed and discussed with patient. Hold off Keytruda due to UTI. Recommend patient to finish antibiotics.   Metastasis to bone (HCC) Recommend Zometa every 4- 6 weeks.-  hold off today due to possible upcoming surgery Continue calcium supplementation.  MRI lumbar with and without contrast showed  Stable remote superior endplate fracture of L2 and large Schmorl to noted involving L5. 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 PET scan shows stable bone lesions.  L4 fracture uptake likely benign- s/p nerve block - pending neurosurgery evaluation.  Hold off Zometa given possibility of upcoming back surgery.  Intraparenchymal hemorrhage of brain (HCC) Hold off Eliquis per neurology Dr. Barbaraann Cao 10/02/22 Repeat MRI brain-stable findings Repeat MRI brain   Pulmonary embolus (HCC) Off Eliquis 2.5mg  BID due to concern of microbleeds in brain  Hypokalemia Recommend potassium daily x 3 days. Rx sent.     Follow up LOS All questions were answered. The patient knows to call the clinic with any problems, questions or concerns.  [name], MD, [name] [facility] Hematology Oncology 02/02/2023      HISTORY OF PRESENTING ILLNESS:   Micheal Donovan is a  72 y.o.  male presents for follow up of Non-small cell lung cancer.  Oncology  History Overview Note  Diagnosis: Stage IIB T2b N1 M0 adenocarcinoma of the LUL, poorly differentiated     Primary non-small cell carcinoma of upper lobe of left lung (HCC)  06/13/2021 Initial Diagnosis   Primary non-small cell carcinoma of upper lobe of left lung [facility]) -06/20/2021 - 06/27/2021, patient presented to [facility] due to progressive headache/dizziness/gait changes. CT head showed acute to subacute intraparenchymal hemorrhage involving the left cerebellum.  Surrounding low-density vasogenic edema.  Patient was transferred to [facility]. This was further evaluated by CT angiogram of the neck which showed bulky calcified plaques/stenosis of carotid artery, Followed by MRI brain. 06/12/2021, MRI of the brain showed no significant interval change in size of the left cerebellar intraparenchymal hematoma with unchanged regional mass effect and partial effacement of fourth ventricle but no upstream hydrocephalus. There is no discernible enhancement to suggest underlying mass lesion, though acute blood products could mask enhancement.   06/12/2021 a chest x-ray showed a 4.4 cm left middle lobe. 06/13/2021, CT chest with contrast showed a 4.3 x 3.6 x 3.3 cm lobular spiculated mass in the posterior left upper lobe with T3 to the lateral pleura and major fissure.  Metastatic left hilar lymphadenopathy.  Peripheral micronodularity posterior right costophrenic sulcus.  Aortic atherosclerosis. 06/14/2021, CT abdomen pelvis showed right lower lobe pulmonary artery embolus.  No evidence of right heart strain.  No acute intra-abdominal or pelvic pathology.  Aortic atherosclerosis.  06/13/2021, patient underwent bronchoscopy with EBUS by Dr. Katrinka Blazing.  Biopsy from the fine-needle aspiration of station 11 mL showed malignant cells, consistent with poorly differentiated non-small cell carcinoma,  consistent with adenocarcinoma.  Malignant cells are TTF-1 positive and negative for p40.  Negative for  neuroendocrine markers.  Blood test and tissue sample were tested for Gardant 360  -PD-L1 TPS 97%, no actionable mutation on the blood testing. 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   07/17/2021 Cancer Staging   Staging form: Lung, AJCC 8th Edition - Clinical: Stage IV (cT2b, cN1, cM1) - Signed by [name], MD on 08/06/2021   08/06/2021 Imaging   I saw patient's PET scan after his visit with me on 08/06/21. PET scan was ordered by his previous oncologist group and did not come to my in basket.. Patient received cycle 1 carboplatin Taxol today.  He has started on radiation.  5/26/3 PET scan showed 3.8 cm hypermetabolic left upper lobe mass with hypermetabolic left hilar and infrahilar adenopathy.  There is approximately 8 scattered metastatic lesions in the skeleton. Mixed density photopenic lesion in the left cerebellum. characterized as hemorrhage on recent prior imaging workups.    08/16/2021 -  Chemotherapy   Patient is on Treatment Plan : LUNG Carboplatin (5) + Pemetrexed + Pembrolizumab (200) D1 q21d Induction x 4 cycles / Maintenance Pemetrexed + Pembrolizumab (200) D1 q21d      08/22/2021 Imaging   MRI brain w wo contrast  Decrease in size of left cerebellar hematoma with resolution of edema. No evidence of underlying lesion. Smaller, more recent hemorrhage in the left cerebellar vermis with minimal edema. There is minimal enhancement without definite evidence of underlying lesion.  New punctate focus of chronic blood products and enhancement in the left frontal lobe. Additional new foci of chronic blood products in the posterior right putamen, right parietal subcortical white matter, and posterior right cerebellum. Unclear at this time if these represent foci of bland hemorrhage or early metastases.   Increase in size of right parietal osseous metastasis with minor extraosseous extension.     Imaging   PET scan showed 1. Interval response to therapy as evidenced by a  small residual left upper lobe nodule with decreased hypermetabolism, no residual hypermetabolic adenopathy and decreased hypermetabolism associatedwith osseous metastases. 2. 6 mm posterior left upper lobe nodule, likely stable. Recommend attention on follow-up. 3. Aortic atherosclerosis (ICD10-I70.0). Coronary artery calcification.   12/17/2021 Imaging   MRI lumbar spine w wo contrast  1. No evidence of regional metastatic disease. 2. Late subacute superior endplate fracture at L2 and large superior endplate Schmorl's node at L5, unchanged since the PET scan of 10/29/2021. 3. L3-4: Shallow disc protrusion. Facet and ligamentous hypertrophy. Stenosis of both lateral recesses. Findings slightly worsened since 2022. 4. L4-5: Shallow disc protrusion. Facet and ligamentous hypertrophy. Small synovial cyst arising from the facet joint on the right. Stenosis of the lateral recesses right worse than left. Foraminal narrowing right worse than left. Findings have worsened since 2022.  5. L5-S1: Endplate osteophytes and shallow protrusion of the disc. Facet and ligamentous hypertrophy. Stenosis of the subarticular lateral recesses and neural foramina, right worse than left. Similar appearance to the study of 2022. 6. Continued evidence of enteritis of at least 1 loop of small bowel. Distended bladder present   12/18/2021 Imaging   Brian MRI w wo  1. Interval decrease in size of the left cerebellar and vermian hematomas. No definite evidence of underlying metastatic lesion. 2. There is are two possible contrast enhancing lesions in the posterior left frontal lobe and left occipital lobe, which do not have intrinsic T1 signal abnormality, but have a somewhat linear appearance  and may be vascular in nature. Recommend attention on follow up. 3. Multifocal sites of susceptibility artifact are redemonstrated with interval development of a few new foci, as described above. All of these sites demonstrate intrinsic  T1 hyperintense signal abnormality and susceptibility artifact and are compatible with sites of microhemorrhages. Recommend continued attention on follow up. 4. Interval decrease in size of right parietal calvarial metastatic lesion. No new contrast enhancing lesions visualized. 5. Diffusely heterogeneous marrow signal throughout the cervical spine, which is nonspecific but can be seen in the setting of anemia, smoking, obesity, or a marrow replacement process.     12/18/2021 Imaging   MRI lumbar spine w wo contrast  1. No evidence of regional metastatic disease. 2. Late subacute superior endplate fracture at L2 and large superior endplate Schmorl's node at L5, unchanged since the PET scan of 10/29/2021 3. L3-4: Shallow disc protrusion. Facet and ligamentous hypertrophy.Stenosis of both lateral recesses. Findings slightly worsened since 2022. 4. L4-5: Shallow disc protrusion. Facet and ligamentous hypertrophy. Small synovial cyst arising from the facet joint on the right. Stenosis of the lateral recesses right worse than left. Foraminal narrowing right worse than left. Findings have worsened since 2022. 5. L5-S1: Endplate osteophytes and shallow protrusion of the disc. Facet and ligamentous hypertrophy. Stenosis of the subarticular lateral recesses and neural foramina, right worse than left. Similar appearance to the study of 2022. 6. Continued evidence of enteritis of at least 1 loop of small bowel. Distended bladder present.       01/28/2022 Imaging   CT chest abdomen pelvis w contrast 1. Treated left upper lobe mass appears grossly stable to the prior examination when measured in a similar fashion on the prior study. No definite signs of extra skeletal metastatic disease noted in the chest, abdomen or pelvis. 2. Widespread skeletal metastases redemonstrated, as above. 3. Hepatic steatosis. 4. Aortic atherosclerosis, in addition to left main and three-vessel coronary artery disease.  Assessment for potential risk factor modification, dietary therapy or pharmacologic therapy may be warranted, if clinically indicated. 5. Additional incidental findings,    04/08/2022 Imaging   MRI lumbar spine w wo contrast  1. Stable remote superior endplate fracture of L2 and large Schmorl to noted involving L5. No worrisome bone lesions to suggest metastatic disease. 2. Degenerative lumbar spondylosis with multilevel disc disease and facet disease which appears relatively stable as detailed above. 3. Enlarging right-sided synovial cyst at L4-5 with progressive mass effect on the right side of the thecal sac. This could potentially be symptomatic   04/28/2022 Imaging   CT chest abdomen pelvis wo contrast  1. Similar versus slightly decreased size of treated left upper lobe primary bronchogenic carcinoma. 2. Similar osseous metastasis. 3. No evidence of extraosseous metastatic disease. 4. New small right pleural effusion. 5. Coronary artery atherosclerosis. Aortic Atherosclerosis   07/03/2022 Imaging   PET scan showed 1. Interval development of a small focus of intense hypermetabolism within the left upper lobe scar. This is associated with a 5 mm perifissural nodule in the left lung which is hypermetabolic. Hypermetabolism at both of these locations is new since prior PET-CT and highly suspicious for recurrent disease. 2. No evidence for hypermetabolic soft tissue metastatic disease in the neck, abdomen, or pelvis. 3. Similar appearance of sclerotic and lytic bone lesions without hypermetabolism on PET imaging. 4. Focal hypermetabolism associated with a fracture of the right L4 transverse process. The fracture was visible on the previous CT of 04/28/2022. No associated soft tissue lesion to suggest  that this is pathologic in nature. Tracer accumulation on today's study is in keeping with healing fracture. There is also some minimal uptake in a prominent spur associated with the L4 superior  endplate, most likely degenerative. 5.  Aortic Atherosclerosis    08/01/2022 - 08/14/2022 Radiation Therapy   Radiation to left lung.    10/01/2022 Imaging   chest angiogram PE protocol  No pulmonary embolism identified.   Stable nodular opacity in the left upper lobe. Please correlate for history of treated lung cancer. There is an adjacent small nodule along the course of the interlobar fissure which is slightly larger today compared to the study of February 2024. please correlate with findings of the recent PET-CT of 07/03/2022.      10/02/2022 Imaging   MRI brain with and without contrast 1. No acute intracranial abnormality. No evidence for intraparenchymal metastatic disease. 2. Chronic left cerebellar hemorrhage with multiple additional chronic micro hemorrhages elsewhere throughout the brain, stable. 3. Underlying mild chronic microvascular ischemic disease.    10/02/2022 Imaging   MRI lumbar spine showed 1. No MRI evidence for acute infection or other abnormality within the lumbar spine. 2. Few subcentimeter T1 hypointense lesions about the visualized posterior iliac wings. These correspond with small sclerotic foci seen on most recent CT from 04/08/2022, presumably reflecting patient's known osseous metastatic disease. These were not hypermetabolic on prior PET-CT from 16/12/9602. 3. No other evidence for metastatic disease within the lumbar spine itself. 4. Chronic L2 compression fracture, stable. Large Schmorl's node deformity with associated height loss at L5, also stable. 5. Multifactorial degenerative changes at L3-4 through L5-S1 with resultant mild to moderate bilateral subarticular stenosis, with mild to moderate bilateral L3 through L5 foraminal narrowing as above.   12/24/2022 Imaging   CT chest w contrast   1. Stable appearance of treated lung lesion within the left upper lobe with surrounding scarring and architectural distortion. 2. The previous FDG avid  subpleural nodule abutting the oblique fissure of the left lung (adjacent to post treatment changes) is again noted measuring 7 mm. This is stable from 10/01/2022. On the PET-CT from 07/03/2022 this nodule was measured at 5 mm. 3. New bandlike area of subpleural consolidation within the lateral and posterior left lower lobe is identified. This is favored to represent post treatment change. 4. Stable sclerotic lesions involving the T12 and L1 vertebra. 5. Coronary artery calcifications. 6.  Aortic Atherosclerosis    # Patient has a history of melanoma on his neck, treated in 2009. # History of hemorrhagic infarct left cerebellum  10/01/2022 - 10/06/2022 patient was admitted to the hospital due to lower extremity weakness and intermittent aphasia. Patient reports acute on chronic left lower extremity weakness, weakness has been more prominent over the past 2 weeks.  He reports significant episode of difficulty with ambulation 2 days after his last Keytruda treatments.  He reports that he felt loss of control of muscle at that time.  Patient reports that the weakness is different than her chronic lower extremity  weakness.  Patient was found to have orthostatic hypotension Flomax irbesartan were held.  CK wnl, Neurology was consulted and started the patient on Keppra for possible seizure.  EEG was negative.   INTERVAL HISTORY Micheal Donovan is a 72 y.o. male who has above history reviewed by me today presents for follow up visit for management of  Stage IV lung adenocarcinoma Accompanied by wife.  + back pain, and left lower extremity weakness due to pain, on fentanyl  patch Q72 hours and percocet Lower extremity weakness localizes best to lumbosacral nerve roots, known spondylosis with multiple foci of neuro-foraminal stenosis.  Pending neurosurgery  01/31/23 He recently presented to ER due to altered mental status and shaking episodes.  CT head wo negative. UA positive for nitrate and leukocyte  esterase. Code sepsis was actived in ER and he was recommended for treatment of UTI.  He declined admission and went home with Keflex 500mg  4 times daily for 10 days. He only took one dose on 11/30 and took 2 doses on 12/1.  No fever or chills. No additional shaking episodes. Mental status back to base line per wife.      Review of Systems  Constitutional:  Positive for appetite change, fatigue and unexpected weight change. Negative for chills and fever.  HENT:   Negative for hearing loss and voice change.   Eyes:  Negative for eye problems and icterus.  Respiratory:  Positive for shortness of breath. Negative for chest tightness and cough.   Cardiovascular:  Positive for leg swelling. Negative for chest pain.  Gastrointestinal:  Negative for abdominal distention, abdominal pain and blood in stool.  Endocrine: Negative for hot flashes.  Genitourinary:  Negative for difficulty urinating, dysuria and frequency.   Musculoskeletal:  Positive for back pain. Negative for arthralgias.  Skin:  Negative for itching and rash.  Neurological:  Negative for extremity weakness, headaches, light-headedness and numbness.  Hematological:  Negative for adenopathy. Does not bruise/bleed easily.  Psychiatric/Behavioral:  Positive for sleep disturbance. Negative for confusion.     MEDICAL HISTORY:  Past Medical History:  Diagnosis Date   Allergy    Anxiety    BPH (benign prostatic hypertrophy)    Cancer (HCC)    Melanoma on Neck    2008   Carotid artery occlusion    CHF (congestive heart failure) (HCC)    Diabetes mellitus    type 2   ED (erectile dysfunction)    GERD (gastroesophageal reflux disease)    Hemorrhagic stroke (HCC) 06/2021   Hyperlipidemia    Hypertension    Lung cancer (HCC)    Neoplasm related pain    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)    Stroke [facility])     SURGICAL HISTORY: Past Surgical History:  Procedure Laterality Date   BRONCHIAL NEEDLE ASPIRATION BIOPSY  06/17/2021    Procedure: BRONCHIAL NEEDLE ASPIRATION BIOPSIES;  Surgeon: Lorin Glass, MD;  Location: [facility] ENDOSCOPY;  Service: Pulmonary;;   IR IMAGING GUIDED PORT INSERTION  08/05/2021   MELANOMA EXCISION  2008   Left side of neck   RADIOLOGY WITH ANESTHESIA N/A 04/05/2020   Procedure: MRI SPINE WITOUT CONTRAST;  Surgeon: Radiologist, Medication, MD;  Location: [facility] OR;  Service: Radiology;  Laterality: N/A;   RADIOLOGY WITH ANESTHESIA N/A 08/22/2021   Procedure: MRI BRAIN WITH AND WITHOUT CONTRAST  WITH ANESTHESIA;  Surgeon: Radiologist, Medication, MD;  Location: [facility] OR;  Service: Radiology;  Laterality: N/A;   RADIOLOGY WITH ANESTHESIA N/A 12/17/2021   Procedure: MRI BRAIN WITH AND WITHOUT CONTRAST WITH ANESTHESIA; MRI LUMBER WITH AND WITHOUT CONTRAST;  Surgeon: Radiologist, Medication, MD;  Location: [facility] OR;  Service: Radiology;  Laterality: N/A;   RADIOLOGY WITH ANESTHESIA N/A 04/08/2022   Procedure: MRI LUMBER SPINE WITH AND WITHOUT CONTRAST;  Surgeon: Radiologist, Medication, MD;  Location: [facility] OR;  Service: Radiology;  Laterality: N/A;   TONSILLECTOMY     VIDEO BRONCHOSCOPY WITH ENDOBRONCHIAL ULTRASOUND N/A 06/17/2021   Procedure: VIDEO BRONCHOSCOPY WITH ENDOBRONCHIAL  ULTRASOUND;  Surgeon: Lorin Glass, MD;  Location: [facility] ENDOSCOPY;  Service: Pulmonary;  Laterality: N/A;    SOCIAL HISTORY: Social History   Socioeconomic History   Marital status: Married    Spouse name: Not on file   Number of children: Not on file   Years of education: Not on file   Highest education level: Not on file  Occupational History   Not on file  Tobacco Use   Smoking status: Former    Current packs/day: 0.00    Types: Cigarettes    Quit date: 07/21/1990    Years since quitting: 32.5   Smokeless tobacco: Never  Vaping Use   Vaping status: Never Used  Substance and Sexual Activity   Alcohol use: No   Drug use: No   Sexual activity: Not Currently  Other Topics Concern   Not on file  Social History Narrative    Not on file   Social Determinants of Health   Financial Resource Strain: Low Risk  (04/17/2022)   Overall Financial Resource Strain (CARDIA)    Difficulty of Paying Living Expenses: Not hard at all  Food Insecurity: No Food Insecurity (10/01/2022)   Hunger Vital Sign    Worried About Running Out of Food in the Last Year: Never true    Ran Out of Food in the Last Year: Never true  Transportation Needs: No Transportation Needs (10/01/2022)   PRAPARE - Administrator, Civil Service (Medical): No    Lack of Transportation (Non-Medical): No  Physical Activity: Inactive (03/27/2022)   Exercise Vital Sign    Days of Exercise per Week: 0 days    Minutes of Exercise per Session: 0 min  Stress: Stress Concern Present (03/27/2022)   [employer] of Occupational Health - Occupational Stress Questionnaire    Feeling of Stress : To some extent  Social Connections: Unknown (11/17/2022)   [facility] from [facility]   Social Network    Social Network: Not on file  Intimate Partner Violence: Unknown (11/17/2022)   Received from [facility]   HITS    Physically Hurt: Not on file    Insult or Talk Down To: Not on file    Threaten Physical Harm: Not on file    Scream or Curse: Not on file    FAMILY HISTORY: Family History  Problem Relation Age of Onset   COPD Mother    Heart disease Father    Heart disease Brother        MI at age 56   Stroke Neg Hx     ALLERGIES:  is allergic to niaspan [niacin].  MEDICATIONS:  Current Outpatient Medications  Medication Sig Dispense Refill   ALPRAZolam (XANAX) 0.5 MG tablet Take 1 tablet (0.5 mg total) by mouth at bedtime as needed for anxiety. 15 tablet 0   cephALEXin (KEFLEX) 500 MG capsule Take 1 capsule (500 mg total) by mouth 4 (four) times daily. 40 capsule 0   citalopram (CELEXA) 10 MG tablet Take 1 tablet (10 mg total) by mouth daily. 30 tablet 3   docusate sodium (COLACE) 100 MG capsule Take 100 mg by mouth 2 (two) times daily as  needed for mild constipation.     fentaNYL (DURAGESIC) 75 MCG/HR Place 1 patch onto the skin every 3 (three) days. 10 patch 0   finasteride (PROSCAR) 5 MG tablet Take 1 tablet (5 mg total) by mouth daily. 30 tablet 0   gabapentin (NEURONTIN) 300 MG capsule Take 2 capsules (600 mg  total) by mouth 2 (two) times daily. 120 capsule 2   insulin glargine (LANTUS) 100 UNIT/ML injection Inject 0-30 Units into the skin daily as needed (High Blood Sugar over 150).     levETIRAcetam (KEPPRA) 500 MG tablet Take 1 tablet (500 mg total) by mouth 2 (two) times daily. 60 tablet 5   megestrol (MEGACE) 40 MG tablet Take 1 tablet (40 mg total) by mouth daily. 30 tablet 3   NOVOLOG FLEXPEN 100 UNIT/ML FlexPen Inject 0-10 Units into the skin daily as needed for high blood sugar (BG > 200).     omeprazole (PRILOSEC) 20 MG capsule Take 20 mg by mouth daily.     oxyCODONE-acetaminophen (PERCOCET) 7.5-325 MG tablet Take 1 tablet by mouth every 4 (four) hours as needed for severe pain (pain score 7-10). Do not take with cough medication or other pain medication 60 tablet 0   potassium chloride SA (KLOR-CON M) 20 MEQ tablet Take 1 tablet (20 mEq total) by mouth daily. 3 tablet 0   valsartan (DIOVAN) 80 MG tablet Take 80 mg by mouth daily.     No current facility-administered medications for this visit.   Facility-Administered Medications Ordered in Other Visits  Medication Dose Route Frequency Provider Last Rate Last Admin   heparin lock flush 100 UNIT/ML injection              PHYSICAL EXAMINATION:  Vitals:   02/02/23 1321  BP: (!) 142/116  Pulse: 83  Resp: 18  Temp: (!) 97 F (36.1 C)  SpO2: 100%   Filed Weights   02/02/23 1321  Weight: 130 lb 12.8 oz (59.3 kg)    Physical Exam Constitutional:      General: He is not in acute distress.    Appearance: He is ill-appearing.  HENT:     Head: Normocephalic and atraumatic.  Eyes:     General: No scleral icterus. Cardiovascular:     Rate and Rhythm:  Normal rate and regular rhythm.     Heart sounds: Normal heart sounds.  Pulmonary:     Effort: Pulmonary effort is normal. No respiratory distress.     Breath sounds: No wheezing.     Comments: Decreased breath sound bilaterally. Abdominal:     General: Bowel sounds are normal. There is no distension.     Palpations: Abdomen is soft.  Musculoskeletal:        General: No deformity. Normal range of motion.     Cervical back: Normal range of motion and neck supple.     Comments: Bilateral ankle trace edema.  Skin:    General: Skin is warm and dry.     Findings: No rash.  Neurological:     Mental Status: He is alert. Mental status is at baseline.     Cranial Nerves: No cranial nerve deficit.     Comments: Chronic left-sided weakness  Psychiatric:        Mood and Affect: Mood normal.     LABORATORY DATA:  I have reviewed the data as listed    Latest Ref Rng & Units 02/02/2023   12:58 PM 01/31/2023   12:27 AM 01/12/2023   12:52 PM  CBC  WBC 4.0 - 10.5 K/uL 7.8  11.5  5.8   Hemoglobin 13.0 - 17.0 g/dL 14.7  82.9  56.2   Hematocrit 39.0 - 52.0 % 34.0  39.4  35.0   Platelets 150 - 400 K/uL 254  205  257       Latest Ref  Rng & Units 02/02/2023   12:58 PM 01/31/2023   12:27 AM 01/12/2023   12:52 PM  CMP  Glucose 70 - 99 mg/dL 191  478  295   BUN 8 - 23 mg/dL 17  16  15    Creatinine 0.61 - 1.24 mg/dL 6.21  3.08  6.57   Sodium 135 - 145 mmol/L 138  137  135   Potassium 3.5 - 5.1 mmol/L 3.2  3.7  4.1   Chloride 98 - 111 mmol/L 102  100  102   CO2 22 - 32 mmol/L 26  26  26    Calcium 8.9 - 10.3 mg/dL 8.3  9.2  8.7   Total Protein 6.5 - 8.1 g/dL 6.4  7.0  6.5   Total Bilirubin <1.2 mg/dL 0.6  0.5  0.3   Alkaline Phos 38 - 126 U/L 52  66  60   AST 15 - 41 U/L 12  15  15    ALT 0 - 44 U/L 9  9  9       Iron/TIBC/Ferritin/ %Sat    Component Value Date/Time   IRON 60 02/03/2022 0958   TIBC 391 02/03/2022 0958   FERRITIN 704 (H) 02/03/2022 0958   IRONPCTSAT 15 (L) 02/03/2022  8469      RADIOGRAPHIC STUDIES: I have personally reviewed the radiological images as listed and agreed with the findings in the report. CT Head Wo Contrast  Result Date: 01/31/2023 CLINICAL DATA:  Tremors and altered mental status. EXAM: CT HEAD WITHOUT CONTRAST TECHNIQUE: Contiguous axial images were obtained from the base of the skull through the vertex without intravenous contrast. RADIATION DOSE REDUCTION: This exam was performed according to the departmental dose-optimization program which includes automated exposure control, adjustment of the mA and/or kV according to patient size and/or use of iterative reconstruction technique. COMPARISON:  None Available. FINDINGS: Brain: There is mild cerebral atrophy with widening of the extra-axial spaces and ventricular dilatation. There are areas of decreased attenuation within the white matter tracts of the supratentorial brain, consistent with microvascular disease changes. A stable, chronic 12 mm x 6 mm x 14 mm area of calcification and adjacent encephalomalacia is seen within the cerebellum on the left. Vascular: Mild to moderate severity calcification of the bilateral cavernous carotid arteries is seen. Skull: Normal. Negative for fracture or focal lesion. Sinuses/Orbits: There is mild sphenoid sinus mucosal thickening. Other: A small, stable left frontal scalp lipoma is noted. IMPRESSION: 1. Generalized cerebral atrophy with chronic white matter small vessel ischemic changes. 2. No acute intracranial abnormality. 3. Mild sphenoid sinus disease. Electronically Signed   By: Aram Candela M.D.   On: 01/31/2023 01:46   DG Chest Port 1 View  Result Date: 01/31/2023 CLINICAL DATA:  Question of sepsis to evaluate for abnormality. Altered mental status with tremors. Left-sided weakness. Fever and tachycardia. EXAM: PORTABLE CHEST 1 VIEW COMPARISON:  02/13/2022 FINDINGS: Power port type central venous catheter with tip over the cavoatrial junction  region. No pneumothorax. Linear scarring in the left mid lung corresponding to scarring and nodularity on prior CT 12/19/2022. No significant change. Right lung is clear. No pleural effusions. No pneumothorax. Mediastinal contours appear intact. Calcification of the aorta. Degenerative changes in the spine and shoulders. IMPRESSION: 1. Linear scarring corresponding to known left lung mass and treatment changes. 2. No acute consolidation. Electronically Signed   By: Burman Nieves M.D.   On: 01/31/2023 00:50   MR CERVICAL SPINE WO CONTRAST  Result Date: 01/09/2023 CLINICAL DATA:  Initial  evaluation for chronic myelopathy. Neck pain. EXAM: MRI CERVICAL SPINE WITHOUT CONTRAST TECHNIQUE: Multiplanar, multisequence MR imaging of the cervical spine was performed. No intravenous contrast was administered. COMPARISON:  None Available. FINDINGS: Alignment: Straightening of the normal cervical lordosis. No significant listhesis. Vertebrae: Vertebral body height maintained without acute or chronic fracture. Prior fusion at C5-6. Bone marrow signal intensity within normal limits. No worrisome osseous lesions. No significant abnormal marrow edema. Cord: Patchy signal abnormality involving the cervical cord at the level of C5-6, consistent with chronic myelomalacia. Focal cord atrophy at this level. Posterior Fossa, vertebral arteries, paraspinal tissues: Age-related cerebral atrophy. Craniocervical junction within normal limits. Paraspinous soft tissues normal. Normal flow voids seen within the vertebral arteries bilaterally. Sphenoid sinus retention cyst noted. Disc levels: C2-C3: Central disc osteophyte complex indents and partially faces the ventral thecal sac. Moderate right with mild left facet arthrosis. Mild spinal stenosis. Mild left with severe right C3 foraminal stenosis. C3-C4: Degenerative vertebral disc space narrowing. Broad-based posterior disc osteophyte complex flattens and effaces the ventral thecal sac,  asymmetric to the left. Superimposed facet and ligament flavum hypertrophy. Secondary cord flattening without visible cord signal changes. Severe spinal stenosis. Severe left with moderate right C4 foraminal narrowing. C4-C5: Degenerate intervertebral disc space narrowing with diffuse disc osteophyte complex. Broad posterior component flattens and effaces the ventral thecal sac. Superimposed facet and ligament flavum hypertrophy. Secondary cord flattening without cord signal changes. Severe spinal stenosis with severe bilateral C5 foraminal narrowing. C5-C6: Prior fusion. Endplate osseous ridging indents the ventral thecal sac with residual mild spinal stenosis. Foramina appear patent. C6-C7: Degenerative vertebral disc space narrowing. Central to left paracentral disc osteophyte complex indents the ventral thecal sac (series 108, image 23). Minimal cord flattening without cord signal changes. Mild spinal stenosis. Bilateral uncovertebral spurring with resultant moderate bilateral C7 foraminal stenosis. C7-T1: Degenerative disc space narrowing with diffuse disc bulge. Endplate and uncovertebral spurring. Mild facet hypertrophy. No spinal stenosis. Mild to moderate left C8 foraminal narrowing. Right neural foramen remains patent. T1-2: Seen only on sagittal projection. Diffuse disc bulge with endplate spurring. No significant spinal stenosis. Mild to moderate bilateral foraminal narrowing. T2-3: Seen only on sagittal projection. Mild disc bulge with endplate spurring. No spinal stenosis. Mild to moderate bilateral foraminal narrowing. IMPRESSION: 1. Prior fusion at C5-6 with residual mild spinal stenosis. Patchy signal abnormality involving the cervical cord at this level, consistent with chronic myelomalacia. 2. Degenerative spondylosis and facet hypertrophy at C3-4 and C4-5 with resultant severe spinal stenosis, with moderate to severe bilateral C4 and C5 foraminal narrowing as above. 3. Degenerative disc  osteophyte at C6-7 with resultant mild spinal stenosis, with moderate bilateral C7 foraminal narrowing. Electronically Signed   By: Rise Mu M.D.   On: 01/09/2023 20:56

## 2023-02-02 NOTE — Assessment & Plan Note (Signed)
Hold off Eliquis per neurology Dr. Barbaraann Cao 10/02/22 Repeat MRI brain-stable findings Repeat MRI brain

## 2023-02-02 NOTE — Progress Notes (Signed)
Pt here for follow up. Pt reports that he wents to ER over the weekend and was put on antibiotics for UTI. He did not want to be admitted. Blood pressure is 142/116, pt did not take BP medication today.

## 2023-02-02 NOTE — Assessment & Plan Note (Signed)
Recommend Zometa every 4- 6 weeks.-  hold off today due to possible upcoming surgery Continue calcium supplementation.  MRI lumbar with and without contrast showed  Stable remote superior endplate fracture of L2 and large Schmorl to noted involving L5. 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 PET scan shows stable bone lesions.  L4 fracture uptake likely benign- s/p nerve block - pending neurosurgery evaluation.  Hold off Zometa given possibility of upcoming back surgery.

## 2023-02-02 NOTE — Assessment & Plan Note (Signed)
Recommend potassium 24mq daily x 3 days. Rx sent.

## 2023-02-02 NOTE — Assessment & Plan Note (Addendum)
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  S/p lung radiation to left lung.  12/19/2022, CT chest stable disease.  Labs are reviewed and discussed with patient. Hold off Keytruda due to UTI. Recommend patient to finish antibiotics.

## 2023-02-03 ENCOUNTER — Telehealth: Payer: Self-pay | Admitting: Neurosurgery

## 2023-02-03 LAB — LEVETIRACETAM LEVEL: Levetiracetam Lvl: <2 ug/mL — ABNORMAL LOW (ref 10.0–40.0)

## 2023-02-03 NOTE — Telephone Encounter (Signed)
Patient is calling to let our office know that he is being treated for a UTI and is currently taking Cephalexin 500mg . He wants to know if this would interfere with his surgery scheduled for 02/09/2023. Please advise.

## 2023-02-03 NOTE — Telephone Encounter (Signed)
Left message to return call to discuss.  

## 2023-02-04 NOTE — Telephone Encounter (Signed)
I spoke with Micheal Donovan. He states that he is feeling better and denies nausea, vomiting, diarrhea, fevers. I informed him that based on this information, we will proceed as planned on Monday.

## 2023-02-05 LAB — CULTURE, BLOOD (ROUTINE X 2)
Culture: NO GROWTH
Special Requests: ADEQUATE

## 2023-02-06 LAB — CULTURE, BLOOD (ROUTINE X 2)
Culture: NO GROWTH
Special Requests: ADEQUATE

## 2023-02-08 MED ORDER — CHLORHEXIDINE GLUCONATE 0.12 % MT SOLN
15.0000 mL | Freq: Once | OROMUCOSAL | Status: AC
Start: 2023-02-08 — End: 2023-02-09
  Administered 2023-02-09: 15 mL via OROMUCOSAL

## 2023-02-08 MED ORDER — ORAL CARE MOUTH RINSE: 15.0000 mL | Freq: Once | OROMUCOSAL | Status: AC

## 2023-02-08 MED ORDER — SODIUM CHLORIDE 0.9 % IV SOLN: INTRAVENOUS | Status: DC

## 2023-02-08 MED ORDER — CEFAZOLIN SODIUM-DEXTROSE 2-4 GM/100ML-% IV SOLN
2.0000 g | INTRAVENOUS | Status: AC
  Administered 2023-02-09: 2 g via INTRAVENOUS

## 2023-02-08 MED ORDER — CEFAZOLIN IN SODIUM CHLORIDE 2-0.9 GM/100ML-% IV SOLN
2.0000 g | Freq: Once | INTRAVENOUS | Status: DC
  Filled 2023-02-08: qty 100

## 2023-02-09 ENCOUNTER — Other Ambulatory Visit: Payer: Self-pay

## 2023-02-09 ENCOUNTER — Inpatient Hospital Stay
Admission: RE | Admit: 2023-02-09 | Discharge: 2023-02-10 | DRG: 472 | Disposition: A | Discharge status: Home Health | Payer: PPO | Attending: Neurosurgery | Admitting: Neurosurgery

## 2023-02-09 ENCOUNTER — Inpatient Hospital Stay: Payer: PPO | Admitting: Certified Registered"

## 2023-02-09 ENCOUNTER — Inpatient Hospital Stay: Payer: PPO

## 2023-02-09 ENCOUNTER — Encounter
Admission: RE | Disposition: A | Discharge status: Home Health | Payer: Self-pay | Source: Home / Self Care | Attending: Neurosurgery

## 2023-02-09 ENCOUNTER — Institutional Professional Consult (permissible substitution): Payer: PPO | Admitting: Neurology

## 2023-02-09 ENCOUNTER — Encounter: Payer: Self-pay | Admitting: Neurosurgery

## 2023-02-09 DIAGNOSIS — Z01818 Encounter for other preprocedural examination: Secondary | ICD-10-CM

## 2023-02-09 DIAGNOSIS — Z79899 Other long term (current) drug therapy: Secondary | ICD-10-CM

## 2023-02-09 DIAGNOSIS — S32049A Unspecified fracture of fourth lumbar vertebra, initial encounter for closed fracture: Secondary | ICD-10-CM | POA: Diagnosis present

## 2023-02-09 DIAGNOSIS — Z87891 Personal history of nicotine dependence: Secondary | ICD-10-CM | POA: Diagnosis not present

## 2023-02-09 DIAGNOSIS — N400 Enlarged prostate without lower urinary tract symptoms: Secondary | ICD-10-CM | POA: Diagnosis present

## 2023-02-09 DIAGNOSIS — M7138 Other bursal cyst, other site: Secondary | ICD-10-CM | POA: Diagnosis not present

## 2023-02-09 DIAGNOSIS — Z888 Allergy status to other drugs, medicaments and biological substances status: Secondary | ICD-10-CM

## 2023-02-09 DIAGNOSIS — Z8249 Family history of ischemic heart disease and other diseases of the circulatory system: Secondary | ICD-10-CM

## 2023-02-09 DIAGNOSIS — Z86711 Personal history of pulmonary embolism: Secondary | ICD-10-CM

## 2023-02-09 DIAGNOSIS — C7951 Secondary malignant neoplasm of bone: Secondary | ICD-10-CM | POA: Diagnosis present

## 2023-02-09 DIAGNOSIS — M5412 Radiculopathy, cervical region: Secondary | ICD-10-CM | POA: Diagnosis not present

## 2023-02-09 DIAGNOSIS — Z9221 Personal history of antineoplastic chemotherapy: Secondary | ICD-10-CM

## 2023-02-09 DIAGNOSIS — I619 Nontraumatic intracerebral hemorrhage, unspecified: Secondary | ICD-10-CM | POA: Diagnosis present

## 2023-02-09 DIAGNOSIS — Z981 Arthrodesis status: Secondary | ICD-10-CM | POA: Diagnosis not present

## 2023-02-09 DIAGNOSIS — Z85820 Personal history of malignant melanoma of skin: Secondary | ICD-10-CM | POA: Diagnosis not present

## 2023-02-09 DIAGNOSIS — G959 Disease of spinal cord, unspecified: Principal | ICD-10-CM | POA: Diagnosis present

## 2023-02-09 DIAGNOSIS — C7931 Secondary malignant neoplasm of brain: Secondary | ICD-10-CM | POA: Diagnosis present

## 2023-02-09 DIAGNOSIS — E1169 Type 2 diabetes mellitus with other specified complication: Secondary | ICD-10-CM

## 2023-02-09 DIAGNOSIS — E119 Type 2 diabetes mellitus without complications: Secondary | ICD-10-CM | POA: Diagnosis present

## 2023-02-09 DIAGNOSIS — M4802 Spinal stenosis, cervical region: Secondary | ICD-10-CM | POA: Diagnosis not present

## 2023-02-09 DIAGNOSIS — I110 Hypertensive heart disease with heart failure: Secondary | ICD-10-CM | POA: Diagnosis not present

## 2023-02-09 DIAGNOSIS — G992 Myelopathy in diseases classified elsewhere: Secondary | ICD-10-CM | POA: Diagnosis not present

## 2023-02-09 DIAGNOSIS — Z794 Long term (current) use of insulin: Secondary | ICD-10-CM

## 2023-02-09 DIAGNOSIS — C3412 Malignant neoplasm of upper lobe, left bronchus or lung: Secondary | ICD-10-CM | POA: Diagnosis present

## 2023-02-09 DIAGNOSIS — M5001 Cervical disc disorder with myelopathy,  high cervical region: Secondary | ICD-10-CM | POA: Diagnosis not present

## 2023-02-09 DIAGNOSIS — Z825 Family history of asthma and other chronic lower respiratory diseases: Secondary | ICD-10-CM | POA: Diagnosis not present

## 2023-02-09 DIAGNOSIS — I5033 Acute on chronic diastolic (congestive) heart failure: Secondary | ICD-10-CM | POA: Diagnosis not present

## 2023-02-09 DIAGNOSIS — R590 Localized enlarged lymph nodes: Secondary | ICD-10-CM | POA: Diagnosis present

## 2023-02-09 DIAGNOSIS — E785 Hyperlipidemia, unspecified: Secondary | ICD-10-CM

## 2023-02-09 HISTORY — PX: ANTERIOR CERVICAL DECOMP/DISCECTOMY FUSION: SHX1161

## 2023-02-09 LAB — GLUCOSE, CAPILLARY
Glucose-Capillary: 41 mg/dL — CL (ref 70–99)
Glucose-Capillary: 96 mg/dL (ref 70–99)
Glucose-Capillary: 96 mg/dL (ref 70–99)
Glucose-Capillary: 187 mg/dL — ABNORMAL HIGH (ref 70–99)
Glucose-Capillary: 226 mg/dL — ABNORMAL HIGH (ref 70–99)

## 2023-02-09 SURGERY — ANTERIOR CERVICAL DECOMPRESSION/DISCECTOMY FUSION 2 LEVELS
Anesthesia: General

## 2023-02-09 MED ORDER — DEXTROSE 50 % IV SOLN
INTRAVENOUS | Status: AC
  Filled 2023-02-09: qty 50

## 2023-02-09 MED ORDER — DEXTROSE 50 % IV SOLN
25.0000 mL | Freq: Once | INTRAVENOUS | Status: AC
  Administered 2023-02-09: 25 mL via INTRAVENOUS

## 2023-02-09 MED ORDER — SODIUM CHLORIDE 0.9% FLUSH: 3.0000 mL | INTRAVENOUS | Status: DC | PRN

## 2023-02-09 MED ORDER — DOCUSATE SODIUM 100 MG PO CAPS
100.0000 mg | ORAL_CAPSULE | Freq: Two times a day (BID) | ORAL | Status: DC
  Administered 2023-02-09 – 2023-02-10 (×2): 100 mg via ORAL
  Filled 2023-02-09 (×2): qty 1

## 2023-02-09 MED ORDER — ONDANSETRON HCL 4 MG/2ML IJ SOLN
INTRAMUSCULAR | Status: DC | PRN
  Administered 2023-02-09: 4 mg via INTRAVENOUS

## 2023-02-09 MED ORDER — GABAPENTIN 300 MG PO CAPS
600.0000 mg | ORAL_CAPSULE | Freq: Two times a day (BID) | ORAL | Status: DC
  Administered 2023-02-09 – 2023-02-10 (×2): 600 mg via ORAL
  Filled 2023-02-09 (×3): qty 2

## 2023-02-09 MED ORDER — PHENYLEPHRINE HCL-NACL 20-0.9 MG/250ML-% IV SOLN
INTRAVENOUS | Status: DC | PRN
  Administered 2023-02-09: 40 ug/min via INTRAVENOUS

## 2023-02-09 MED ORDER — LIDOCAINE HCL (CARDIAC) PF 100 MG/5ML IV SOSY
PREFILLED_SYRINGE | INTRAVENOUS | Status: DC | PRN
  Administered 2023-02-09: 80 mg via INTRAVENOUS

## 2023-02-09 MED ORDER — ACETAMINOPHEN 325 MG PO TABS: 650.0000 mg | ORAL_TABLET | ORAL | Status: DC | PRN

## 2023-02-09 MED ORDER — ACETAMINOPHEN 500 MG PO TABS
1000.0000 mg | ORAL_TABLET | Freq: Four times a day (QID) | ORAL | Status: DC
  Administered 2023-02-09 – 2023-02-10 (×4): 1000 mg via ORAL
  Filled 2023-02-09 (×4): qty 2

## 2023-02-09 MED ORDER — SODIUM CHLORIDE 0.9 % IV SOLN
250.0000 mL | INTRAVENOUS | Status: AC
  Administered 2023-02-09: 250 mL via INTRAVENOUS

## 2023-02-09 MED ORDER — PHENYLEPHRINE HCL-NACL 20-0.9 MG/250ML-% IV SOLN
INTRAVENOUS | Status: AC
  Filled 2023-02-09: qty 250

## 2023-02-09 MED ORDER — IRBESARTAN 150 MG PO TABS
75.0000 mg | ORAL_TABLET | Freq: Every day | ORAL | Status: DC
  Administered 2023-02-09 – 2023-02-10 (×2): 75 mg via ORAL
  Filled 2023-02-09 (×2): qty 1

## 2023-02-09 MED ORDER — SURGIFLO WITH THROMBIN (HEMOSTATIC MATRIX KIT) OPTIME
TOPICAL | Status: DC | PRN
  Administered 2023-02-09: 1 via TOPICAL

## 2023-02-09 MED ORDER — PHENYLEPHRINE 80 MCG/ML (10ML) SYRINGE FOR IV PUSH (FOR BLOOD PRESSURE SUPPORT)
PREFILLED_SYRINGE | INTRAVENOUS | Status: DC | PRN
  Administered 2023-02-09: 80 ug via INTRAVENOUS

## 2023-02-09 MED ORDER — METHOCARBAMOL 500 MG PO TABS
ORAL_TABLET | ORAL | Status: AC
  Filled 2023-02-09: qty 1

## 2023-02-09 MED ORDER — REMIFENTANIL HCL 1 MG IV SOLR
INTRAVENOUS | Status: AC
  Filled 2023-02-09: qty 1000

## 2023-02-09 MED ORDER — POLYETHYLENE GLYCOL 3350 17 G PO PACK: 17.0000 g | PACK | Freq: Every day | ORAL | Status: DC | PRN

## 2023-02-09 MED ORDER — 0.9 % SODIUM CHLORIDE (POUR BTL) OPTIME
TOPICAL | Status: DC | PRN
  Administered 2023-02-09: 500 mL

## 2023-02-09 MED ORDER — DEXAMETHASONE SODIUM PHOSPHATE 10 MG/ML IJ SOLN
INTRAMUSCULAR | Status: DC | PRN
  Administered 2023-02-09: 10 mg via INTRAVENOUS

## 2023-02-09 MED ORDER — ONDANSETRON HCL 4 MG PO TABS: 4.0000 mg | ORAL_TABLET | Freq: Four times a day (QID) | ORAL | Status: DC | PRN

## 2023-02-09 MED ORDER — METHOCARBAMOL 500 MG PO TABS
500.0000 mg | ORAL_TABLET | Freq: Four times a day (QID) | ORAL | Status: DC | PRN
  Administered 2023-02-09: 500 mg via ORAL

## 2023-02-09 MED ORDER — CEFAZOLIN SODIUM-DEXTROSE 2-4 GM/100ML-% IV SOLN
INTRAVENOUS | Status: AC
Start: 2023-02-09 — End: ?
  Filled 2023-02-09: qty 100

## 2023-02-09 MED ORDER — SORBITOL 70 % SOLN: 30.0000 mL | Freq: Every day | Status: DC | PRN

## 2023-02-09 MED ORDER — PHENOL 1.4 % MT LIQD: 1.0000 | OROMUCOSAL | Status: DC | PRN

## 2023-02-09 MED ORDER — PANTOPRAZOLE SODIUM 40 MG PO TBEC
40.0000 mg | DELAYED_RELEASE_TABLET | Freq: Every day | ORAL | Status: DC
  Administered 2023-02-09 – 2023-02-10 (×2): 40 mg via ORAL
  Filled 2023-02-09 (×2): qty 1

## 2023-02-09 MED ORDER — BUPIVACAINE-EPINEPHRINE (PF) 0.5% -1:200000 IJ SOLN
INTRAMUSCULAR | Status: AC
  Filled 2023-02-09: qty 10

## 2023-02-09 MED ORDER — ACETAMINOPHEN 10 MG/ML IV SOLN
INTRAVENOUS | Status: DC | PRN
  Administered 2023-02-09: 1000 mg via INTRAVENOUS

## 2023-02-09 MED ORDER — ACETAMINOPHEN 650 MG RE SUPP: 650.0000 mg | RECTAL | Status: DC | PRN

## 2023-02-09 MED ORDER — OXYCODONE HCL 5 MG PO TABS
5.0000 mg | ORAL_TABLET | Freq: Once | ORAL | Status: AC | PRN
  Administered 2023-02-09: 5 mg via ORAL

## 2023-02-09 MED ORDER — MENTHOL 3 MG MT LOZG: 1.0000 | LOZENGE | OROMUCOSAL | Status: DC | PRN

## 2023-02-09 MED ORDER — CHLORHEXIDINE GLUCONATE 0.12 % MT SOLN
OROMUCOSAL | Status: AC
  Filled 2023-02-09: qty 15

## 2023-02-09 MED ORDER — OXYCODONE HCL 5 MG PO TABS
ORAL_TABLET | ORAL | Status: AC
  Filled 2023-02-09: qty 1

## 2023-02-09 MED ORDER — FENTANYL CITRATE (PF) 100 MCG/2ML IJ SOLN
INTRAMUSCULAR | Status: DC | PRN
  Administered 2023-02-09 (×2): 50 ug via INTRAVENOUS

## 2023-02-09 MED ORDER — KETOROLAC TROMETHAMINE 15 MG/ML IJ SOLN
7.5000 mg | Freq: Four times a day (QID) | INTRAMUSCULAR | Status: AC
  Administered 2023-02-09 – 2023-02-10 (×3): 7.5 mg via INTRAVENOUS
  Filled 2023-02-09 (×3): qty 1

## 2023-02-09 MED ORDER — INSULIN ASPART 100 UNIT/ML IJ SOLN
0.0000 [IU] | Freq: Three times a day (TID) | INTRAMUSCULAR | Status: DC
  Administered 2023-02-09: 3 [IU] via SUBCUTANEOUS
  Administered 2023-02-10 (×2): 2 [IU] via SUBCUTANEOUS
  Filled 2023-02-09 (×3): qty 1

## 2023-02-09 MED ORDER — ACETAMINOPHEN 10 MG/ML IV SOLN
INTRAVENOUS | Status: AC
  Filled 2023-02-09: qty 100

## 2023-02-09 MED ORDER — KETOROLAC TROMETHAMINE 30 MG/ML IJ SOLN
INTRAMUSCULAR | Status: DC | PRN
  Administered 2023-02-09: 30 mg via INTRAVENOUS

## 2023-02-09 MED ORDER — METHOCARBAMOL 1000 MG/10ML IJ SOLN: 500.0000 mg | Freq: Four times a day (QID) | INTRAMUSCULAR | Status: DC | PRN

## 2023-02-09 MED ORDER — ENOXAPARIN SODIUM 40 MG/0.4ML IJ SOSY
40.0000 mg | PREFILLED_SYRINGE | INTRAMUSCULAR | Status: DC
  Administered 2023-02-10: 40 mg via SUBCUTANEOUS
  Filled 2023-02-09: qty 0.4

## 2023-02-09 MED ORDER — MAGNESIUM CITRATE PO SOLN: 1.0000 | Freq: Once | ORAL | Status: DC | PRN

## 2023-02-09 MED ORDER — LEVETIRACETAM 500 MG PO TABS
500.0000 mg | ORAL_TABLET | Freq: Two times a day (BID) | ORAL | Status: DC
  Administered 2023-02-09 – 2023-02-10 (×3): 500 mg via ORAL
  Filled 2023-02-09 (×5): qty 1

## 2023-02-09 MED ORDER — SENNA 8.6 MG PO TABS
1.0000 | ORAL_TABLET | Freq: Two times a day (BID) | ORAL | Status: DC
  Administered 2023-02-09 – 2023-02-10 (×2): 8.6 mg via ORAL
  Filled 2023-02-09 (×2): qty 1

## 2023-02-09 MED ORDER — CEPHALEXIN 500 MG PO CAPS
500.0000 mg | ORAL_CAPSULE | Freq: Four times a day (QID) | ORAL | Status: AC
  Administered 2023-02-09 – 2023-02-10 (×5): 500 mg via ORAL
  Filled 2023-02-09 (×5): qty 1

## 2023-02-09 MED ORDER — ONDANSETRON HCL 4 MG/2ML IJ SOLN: 4.0000 mg | Freq: Four times a day (QID) | INTRAMUSCULAR | Status: DC | PRN

## 2023-02-09 MED ORDER — INSULIN ASPART 100 UNIT/ML IJ SOLN
0.0000 [IU] | Freq: Every day | INTRAMUSCULAR | Status: DC
  Administered 2023-02-09: 2 [IU] via SUBCUTANEOUS
  Filled 2023-02-09: qty 1

## 2023-02-09 MED ORDER — CITALOPRAM HYDROBROMIDE 10 MG PO TABS
10.0000 mg | ORAL_TABLET | Freq: Every day | ORAL | Status: DC
  Administered 2023-02-09 – 2023-02-10 (×2): 10 mg via ORAL
  Filled 2023-02-09 (×2): qty 1

## 2023-02-09 MED ORDER — SUCCINYLCHOLINE CHLORIDE 200 MG/10ML IV SOSY
PREFILLED_SYRINGE | INTRAVENOUS | Status: DC | PRN
  Administered 2023-02-09: 100 mg via INTRAVENOUS

## 2023-02-09 MED ORDER — MEGESTROL ACETATE 20 MG PO TABS
40.0000 mg | ORAL_TABLET | Freq: Every day | ORAL | Status: DC
  Administered 2023-02-09 – 2023-02-10 (×2): 40 mg via ORAL
  Filled 2023-02-09 (×3): qty 2

## 2023-02-09 MED ORDER — OXYCODONE HCL 5 MG PO TABS
10.0000 mg | ORAL_TABLET | ORAL | Status: DC | PRN
  Administered 2023-02-09 – 2023-02-10 (×2): 10 mg via ORAL
  Filled 2023-02-09 (×3): qty 2

## 2023-02-09 MED ORDER — POTASSIUM CHLORIDE CRYS ER 20 MEQ PO TBCR
20.0000 meq | EXTENDED_RELEASE_TABLET | Freq: Every day | ORAL | Status: DC
  Administered 2023-02-09 – 2023-02-10 (×2): 20 meq via ORAL
  Filled 2023-02-09 (×2): qty 1

## 2023-02-09 MED ORDER — FENTANYL 75 MCG/HR TD PT72: 1.0000 | MEDICATED_PATCH | TRANSDERMAL | Status: DC

## 2023-02-09 MED ORDER — FINASTERIDE 5 MG PO TABS
5.0000 mg | ORAL_TABLET | Freq: Every day | ORAL | Status: DC
  Administered 2023-02-09 – 2023-02-10 (×2): 5 mg via ORAL
  Filled 2023-02-09 (×2): qty 1

## 2023-02-09 MED ORDER — FENTANYL CITRATE (PF) 100 MCG/2ML IJ SOLN
INTRAMUSCULAR | Status: AC
  Filled 2023-02-09: qty 2

## 2023-02-09 MED ORDER — HYDROMORPHONE HCL 1 MG/ML IJ SOLN: 0.5000 mg | INTRAMUSCULAR | Status: AC | PRN

## 2023-02-09 MED ORDER — FENTANYL CITRATE (PF) 100 MCG/2ML IJ SOLN
25.0000 ug | INTRAMUSCULAR | Status: DC | PRN
  Administered 2023-02-09 (×4): 25 ug via INTRAVENOUS

## 2023-02-09 MED ORDER — ALPRAZOLAM 0.5 MG PO TABS: 0.5000 mg | ORAL_TABLET | Freq: Every evening | ORAL | Status: DC | PRN

## 2023-02-09 MED ORDER — BUPIVACAINE-EPINEPHRINE (PF) 0.5% -1:200000 IJ SOLN
INTRAMUSCULAR | Status: DC | PRN
  Administered 2023-02-09: 7 mL via PERINEURAL

## 2023-02-09 MED ORDER — PROPOFOL 10 MG/ML IV BOLUS
INTRAVENOUS | Status: DC | PRN
  Administered 2023-02-09: 100 mg via INTRAVENOUS
  Administered 2023-02-09: 130 ug/kg/min via INTRAVENOUS

## 2023-02-09 MED ORDER — OXYCODONE HCL 5 MG PO TABS
5.0000 mg | ORAL_TABLET | ORAL | Status: DC | PRN
  Administered 2023-02-10: 5 mg via ORAL

## 2023-02-09 MED ORDER — REMIFENTANIL HCL 1 MG IV SOLR
INTRAVENOUS | Status: DC | PRN
  Administered 2023-02-09: .1 ug/kg/min via INTRAVENOUS

## 2023-02-09 MED ORDER — OXYCODONE HCL 5 MG/5ML PO SOLN: 5.0000 mg | Freq: Once | ORAL | Status: AC | PRN

## 2023-02-09 SURGICAL SUPPLY — 49 items
ALLOGRAFT BONE FIBER KORE 1CC (Bone Implant) IMPLANT
BASIN KIT SINGLE STR (MISCELLANEOUS) ×1 IMPLANT
BUR NEURO DRILL SOFT 3.0X3.8M (BURR) ×1 IMPLANT
DERMABOND ADVANCED .7 DNX12 (GAUZE/BANDAGES/DRESSINGS) ×1 IMPLANT
DRAIN CHANNEL JP 10F RND 20C F (MISCELLANEOUS) IMPLANT
DRAPE C ARM PK CFD 31 SPINE (DRAPES) ×1 IMPLANT
DRAPE LAPAROTOMY 77X122 PED (DRAPES) ×1 IMPLANT
DRAPE MICROSCOPE SPINE 48X150 (DRAPES) ×1 IMPLANT
DRSG TEGADERM 4X4.75 (GAUZE/BANDAGES/DRESSINGS) IMPLANT
ELECT REM PT RETURN 9FT ADLT (ELECTROSURGICAL) ×1
ELECTRODE REM PT RTRN 9FT ADLT (ELECTROSURGICAL) ×1 IMPLANT
EVACUATOR 1/8 PVC DRAIN (DRAIN) IMPLANT
EVACUATOR SILICONE 100CC (DRAIN) IMPLANT
FEE INTRAOP CADWELL SUPPLY NCS (MISCELLANEOUS) IMPLANT
FEE INTRAOP MONITOR IMPULS NCS (MISCELLANEOUS) IMPLANT
GAUZE SPONGE 2X2 STRL 8-PLY (GAUZE/BANDAGES/DRESSINGS) IMPLANT
GLOVE BIOGEL PI IND STRL 6.5 (GLOVE) ×1 IMPLANT
GLOVE SURG SYN 6.5 ES PF (GLOVE) ×1 IMPLANT
GLOVE SURG SYN 6.5 PF PI (GLOVE) ×1 IMPLANT
GLOVE SURG SYN 8.5 E (GLOVE) ×3 IMPLANT
GLOVE SURG SYN 8.5 PF PI (GLOVE) ×3 IMPLANT
GOWN SRG LRG LVL 4 IMPRV REINF (GOWNS) ×1 IMPLANT
GOWN SRG XL LVL 3 NONREINFORCE (GOWNS) ×1 IMPLANT
HOLDER FOLEY CATH W/STRAP (MISCELLANEOUS) IMPLANT
INTRAOP CADWELL SUPPLY FEE NCS (MISCELLANEOUS) ×1
INTRAOP MONITOR FEE IMPULS NCS (MISCELLANEOUS) ×1
KIT TURNOVER KIT A (KITS) ×1 IMPLANT
MANIFOLD NEPTUNE II (INSTRUMENTS) ×1 IMPLANT
NDL SAFETY ECLIPSE 18X1.5 (NEEDLE) ×1 IMPLANT
NS IRRIG 500ML POUR BTL (IV SOLUTION) ×1 IMPLANT
PACK LAMINECTOMY ARMC (PACKS) ×1 IMPLANT
PAD ARMBOARD 7.5X6 YLW CONV (MISCELLANEOUS) ×2 IMPLANT
PIN CASPAR 14 (PIN) ×1 IMPLANT
PIN CASPAR 14MM (PIN) ×1
PLATE ACP 1.6X38 2LVL (Plate) IMPLANT
SCREW ACP 3.5X17 S/D VARIA (Screw) IMPLANT
SPACER C HEDRON 12X14 7M 7D (Spacer) IMPLANT
SPONGE KITTNER 5P (MISCELLANEOUS) ×1 IMPLANT
STAPLER SKIN PROX 35W (STAPLE) IMPLANT
SURGIFLO W/THROMBIN 8M KIT (HEMOSTASIS) ×1 IMPLANT
SUT ETHILON 3 0 PS 1 (SUTURE) IMPLANT
SUT STRATA 3-0 15 PS-2 (SUTURE) IMPLANT
SUT VIC AB 0 CT1 27XCR 8 STRN (SUTURE) ×2 IMPLANT
SUT VICRYL 3-0 CR8 SH (SUTURE) ×1 IMPLANT
SYR 20ML LL LF (SYRINGE) ×1 IMPLANT
TAPE CLOTH 3X10 WHT NS LF (GAUZE/BANDAGES/DRESSINGS) ×2 IMPLANT
TRAP FLUID SMOKE EVACUATOR (MISCELLANEOUS) ×1 IMPLANT
TRAY FOLEY SLVR 16FR LF STAT (SET/KITS/TRAYS/PACK) IMPLANT
WATER STERILE IRR 500ML POUR (IV SOLUTION) ×1 IMPLANT

## 2023-02-09 NOTE — Transfer of Care (Signed)
Immediate Anesthesia Transfer of Care Note  Patient: Micheal Donovan  Procedure(s) Performed: C3-5 ANTERIOR CERVICAL DISCECTOMY AND FUSION  Patient Location: PACU  Anesthesia Type:General  Level of Consciousness: drowsy and patient cooperative  Airway & Oxygen Therapy: Patient Spontanous Breathing and Patient connected to face mask oxygen  Post-op Assessment: Report given to RN and Post -op Vital signs reviewed and stable  Post vital signs: Reviewed and stable  Last Vitals:  Vitals Value Taken Time  BP 184/92 02/09/23 1132  Temp    Pulse 82 02/09/23 1137  Resp 16 02/09/23 1137  SpO2 100 % 02/09/23 1137  Vitals shown include unfiled device data.  Last Pain:  Vitals:   02/09/23 0757  TempSrc: Oral  PainSc: 0-No pain         Complications: No notable events documented.

## 2023-02-09 NOTE — Anesthesia Postprocedure Evaluation (Signed)
Anesthesia Post Note  Patient: Micheal Donovan  Procedure(s) Performed: C3-5 ANTERIOR CERVICAL DISCECTOMY AND FUSION  Patient location during evaluation: PACU Anesthesia Type: General Level of consciousness: awake and alert Pain management: pain level controlled Vital Signs Assessment: post-procedure vital signs reviewed and stable Respiratory status: spontaneous breathing, nonlabored ventilation, respiratory function stable and patient connected to nasal cannula oxygen Cardiovascular status: blood pressure returned to baseline and stable Postop Assessment: no apparent nausea or vomiting Anesthetic complications: no   No notable events documented.   Last Vitals:  Vitals:   02/09/23 1215 02/09/23 1230  BP: (!) 174/85 (!) 167/81  Pulse: 88 94  Resp: 17 18  Temp:    SpO2: 100% 100%    Last Pain:  Vitals:   02/09/23 1230  TempSrc:   PainSc: 6                  Cleda Mccreedy Jenice Leiner

## 2023-02-09 NOTE — Anesthesia Preprocedure Evaluation (Signed)
Anesthesia Evaluation  Patient identified by MRN, date of birth, ID band Patient awake    Reviewed: Allergy & Precautions, NPO status , Patient's Chart, lab work & pertinent test results  History of Anesthesia Complications Negative for: history of anesthetic complications  Airway Mallampati: III  TM Distance: >3 FB Neck ROM: limited    Dental  (+) Chipped, Poor Dentition   Pulmonary neg shortness of breath, former smoker   Pulmonary exam normal        Cardiovascular Exercise Tolerance: Good hypertension, (-) angina +CHF  Normal cardiovascular exam     Neuro/Psych  Headaches, Seizures -, Poorly Controlled,   Neuromuscular disease CVA (left side), Residual Symptoms  negative psych ROS   GI/Hepatic negative GI ROS, Neg liver ROS,GERD  Controlled,,  Endo/Other  diabetes, Type 2    Renal/GU      Musculoskeletal   Abdominal   Peds  Hematology negative hematology ROS (+)   Anesthesia Other Findings Past Medical History: No date: Allergy No date: Anxiety No date: BPH (benign prostatic hypertrophy) No date: Cancer [facility])     Comment:  Melanoma on Neck    2008 No date: Carotid artery occlusion No date: CHF (congestive heart failure) (HCC) No date: Diabetes mellitus     Comment:  type 2 No date: ED (erectile dysfunction) No date: GERD (gastroesophageal reflux disease) 06/2021: Hemorrhagic stroke (HCC) No date: Hyperlipidemia No date: Hypertension No date: Lung cancer (HCC) No date: Neoplasm related pain No date: 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) No date: Stroke [facility])  Past Surgical History: 06/17/2021: BRONCHIAL NEEDLE ASPIRATION BIOPSY     Comment:  Procedure: BRONCHIAL NEEDLE ASPIRATION BIOPSIES;                Surgeon: Lorin Glass, MD;  Location: [facility] ENDOSCOPY;                Service: Pulmonary;; 08/05/2021: IR IMAGING GUIDED PORT INSERTION 2008: MELANOMA EXCISION     Comment:  Left side of  neck 04/05/2020: RADIOLOGY WITH ANESTHESIA; N/A     Comment:  Procedure: MRI SPINE WITOUT CONTRAST;  Surgeon:               Radiologist, Medication, MD;  Location: [facility] OR;  Service:               Radiology;  Laterality: N/A; 08/22/2021: RADIOLOGY WITH ANESTHESIA; N/A     Comment:  Procedure: MRI BRAIN WITH AND WITHOUT CONTRAST  WITH               ANESTHESIA;  Surgeon: Radiologist, Medication, MD;                Location: [facility] OR;  Service: Radiology;  Laterality: N/A; 12/17/2021: RADIOLOGY WITH ANESTHESIA; N/A     Comment:  Procedure: MRI BRAIN WITH AND WITHOUT CONTRAST WITH               ANESTHESIA; MRI LUMBER WITH AND WITHOUT CONTRAST;                Surgeon: Radiologist, Medication, MD;  Location: [facility] OR;                Service: Radiology;  Laterality: N/A; 04/08/2022: RADIOLOGY WITH ANESTHESIA; N/A     Comment:  Procedure: MRI LUMBER SPINE WITH AND WITHOUT CONTRAST;                Surgeon: Radiologist, Medication, MD;  Location: [facility] OR;  Service: Radiology;  Laterality: N/A; No date: TONSILLECTOMY 06/17/2021: VIDEO BRONCHOSCOPY WITH ENDOBRONCHIAL ULTRASOUND; N/A     Comment:  Procedure: VIDEO BRONCHOSCOPY WITH ENDOBRONCHIAL               ULTRASOUND;  Surgeon: Lorin Glass, MD;  Location: [facility]               ENDOSCOPY;  Service: Pulmonary;  Laterality: N/A;  BMI    Body Mass Index: 20.49 kg/m      Reproductive/Obstetrics negative OB ROS                             Anesthesia Physical Anesthesia Plan  ASA: 3  Anesthesia Plan: General ETT   Post-op Pain Management:    Induction: Intravenous  PONV Risk Score and Plan: Ondansetron, Dexamethasone, Midazolam and Treatment may vary due to age or medical condition  Airway Management Planned: Oral ETT  Additional Equipment:   Intra-op Plan:   Post-operative Plan: Extubation in OR  Informed Consent: I have reviewed the patients History and Physical, chart, labs and discussed the procedure  including the risks, benefits and alternatives for the proposed anesthesia with the patient or authorized representative who has indicated his/her understanding and acceptance.     Dental Advisory Given  Plan Discussed with: Anesthesiologist, CRNA and Surgeon  Anesthesia Plan Comments: (Patient consented for risks of anesthesia including but not limited to:  - adverse reactions to medications - damage to eyes, teeth, lips or other oral mucosa - nerve damage due to positioning  - sore throat or hoarseness - Damage to heart, brain, nerves, lungs, other parts of body or loss of life  Patient voiced understanding and assent.)       Anesthesia Quick Evaluation

## 2023-02-09 NOTE — Plan of Care (Signed)
  Problem: Pain Management: Goal: General experience of comfort will improve Outcome: Progressing   Problem: Safety: Goal: Ability to remain free from injury will improve Outcome: Progressing   Problem: Skin Integrity: Goal: Risk for impaired skin integrity will decrease Outcome: Progressing

## 2023-02-09 NOTE — Op Note (Signed)
Indications: [name] is a 72 y.o. male with G95.9 cervical myelopathy, M48.02 cervical stenosis . Due to ongoing symptoms and lack of reasonable conservative management options, the patient opted for surgical intervention.  Findings: stenosis, successful decompression  Preoperative Diagnosis: G95.9 cervical myelopathy, M48.02 cervical stenosis  Postoperative Diagnosis: same   EBL: 15 ml IVF: see anesthesia record Drains: none Disposition: Extubated and Stable to PACU Complications: none  No foley catheter was placed.  Preoperative Note:   Risks of surgery discussed and documented in clinic note.  Operative Note:   Procedure:  1) Anterior cervical diskectomy and fusion at C3/4 and C4/5 2) Anterior cervical instrumentation at C3 - 5 3) Placement of biomechanical devices at C3/4 and C4/5  4) Use of operative microscope 5) Use of flouroscopy   Procedure: After obtaining informed consent, the patient taken to the operating room, placed in supine position, general anesthesia induced.  The patient had a small shoulder roll placed behind their shoulders.  The patient received preop antibiotics and IV Decadron.  The patient had a neck incision outlined, was prepped and draped in usual sterile fashion. The incision was injected with local anesthetic.   An incision was opened, dissection taken down medial to the carotid artery and jugular vein, lateral to the trachea and esophagus.  The prevertebral fascia identified and a localizing x-ray demonstrated the correct level.  The longus colli were dissected laterally, and self-retaining retractors placed to open the operative field. The microscope was then brought into the field.  With this complete, distractor pins were placed in the vertebral bodies of C3 and C5. The distractor was placed, and the annuli at C3/4 and C4/5 were opened using a bovie.  Curettes and pituitary rongeurs used to remove the majority of C3/4 disk, then the drill was  used to remove the posterior osteophyte and begin the foraminotomies. The nerve hook was used to elevate the posterior longitudinal ligament, which was then removed with Kerrison rongeurs to complete decompression of the spinal cord. The Kerrison rongeurs were then used to complete the foraminotomies bilaterally to decompress the nerve roots. The nerve hook could be passed out each foramen, ensuring decompression of the nerve roots. Meticulous hemostasis was obtained. A biomechanical device (Globus Hedron C 7 mm height x 14 mm width by 12 mm depth) was placed at C3/4. The device had been filled with demineralized bone matrix for aid in arthrodesis.  Please note that the procedure included removal of the disc, removal of the posterior osteophytes, and removal of the posterior longitudinal ligament to ensure decompression of the spinal cord.  Additionally, foraminotomies were performed on both sides of the spinal canal to decompress the nerve roots.  We then moved to the C4/5 level. Curettes and pituitary rongeurs used to remove the majority of the disk, then the drill was used to remove the posterior osteophyte and begin the foraminotomies. The nerve hook was used to elevate the posterior longitudinal ligament, which was then removed with Kerrison rongeurs to complete decompression of the spinal cord. The Kerrison rongeurs were then used to complete the foraminotomies bilaterally to decompress the nerve roots. The nerve hook could be passed out each foramen, ensuring decompression of the nerve roots. Meticulous hemostasis was obtained. A biomechanical device (Globus Hedron C 7 mm height x 14 mm width by 12 mm depth) was placed at C4/5. The device had been filled with demineralized bone matrix for aid in arthrodesis.  Please note that the procedure included removal of the disc,  removal of the posterior osteophytes, and removal of the posterior longitudinal ligament to ensure decompression of the spinal cord.   Additionally, foraminotomies were performed on both sides of the spinal canal to decompress the nerve roots.  The caspar distractor was removed, and bone wax used for hemostasis. A separate, 38 mm 3 segment Nuvasive ACP plate was chosen to bridge the 3 segments.  Two screws placed in each vertebral body, respectively making sure the screws were behind the locking mechanism.  Final AP and lateral radiographs were taken.   Please note that the plate is not inclusive to the biomechanical devices.  The anchoring mechanism of the plate is completely separate from the biomechanical devices.  A drain was placed.  With everything in good position, the wound was irrigated copiously and meticulous hemostasis obtained.  Wound was closed in 2 layers using interrupted inverted 3-0 Vicryl sutures in the platysma and 3-0 monocryl on the dermis.  The wound was dressed with dermabond, the head of bed at 30 degrees, taken to recovery room in stable condition.  No new postop neurological deficits were identified.  Sponge and pattie counts were correct at the end of the procedure.     I performed the entire procedure with the assistance of Manning Charity PA as an Designer, television/film set. An assistant was required for this procedure due to the complexity.  The assistant provided assistance in tissue manipulation and suction, and was required for the successful and safe performance of the procedure. I performed the critical portions of the procedure.   Venetia Night MD

## 2023-02-09 NOTE — Anesthesia Procedure Notes (Signed)
Procedure Name: Intubation Date/Time: 02/09/2023 9:20 AM  Performed by: Katherine Basset, CRNAPre-anesthesia Checklist: Patient identified, Emergency Drugs available, Suction available and Patient being monitored Patient Re-evaluated:Patient Re-evaluated prior to induction Oxygen Delivery Method: Circle system utilized Preoxygenation: Pre-oxygenation with 100% oxygen Induction Type: IV induction Laryngoscope Size: McGrath and 3 Grade View: Grade I Tube type: Oral Tube size: 7.0 mm Number of attempts: 1 Airway Equipment and Method: Stylet, Oral airway, Bite block and LTA kit utilized Placement Confirmation: ETT inserted through vocal cords under direct vision, positive ETCO2 and breath sounds checked- equal and bilateral Secured at: 21 cm Tube secured with: Tape Dental Injury: Teeth and Oropharynx as per pre-operative assessment

## 2023-02-09 NOTE — Discharge Instructions (Signed)
Your surgeon has performed an operation on your cervical spine (neck) to relieve pressure on the spinal cord and/or nerves. This involved making an incision in the front of your neck and removing one or more of the discs that support your spine. Next, a small piece of bone, a titanium plate, and screws were used to fuse two or more of the vertebrae (bones) together.  The following are instructions to help in your recovery once you have been discharged from the hospital. Even if you feel well, it is important that you follow these activity guidelines. If you do not let your neck heal properly from the surgery, you can increase the chance of return of your symptoms and other complications.  * Do not take anti-inflammatory medications for 3 months after surgery (naproxen [Aleve], ibuprofen [Advil, Motrin], etc.). These medications can prevent your bones from healing properly.  Celebrex, if prescribed, is ok to take.  Activity    No bending, lifting, or twisting ("BLT"). Avoid lifting objects heavier than 10 pounds (gallon milk jug).  Where possible, avoid household activities that involve lifting, bending, reaching, pushing, or pulling such as laundry, vacuuming, grocery shopping, and childcare. Try to arrange for help from friends and family for these activities while your back heals.  Increase physical activity slowly as tolerated.  Taking short walks is encouraged, but avoid strenuous exercise. Do not jog, run, bicycle, lift weights, or participate in any other exercises unless specifically allowed by your doctor.  Talk to your doctor before resuming sexual activity.  You should not drive until cleared by your doctor.  Until released by your doctor, you should not return to work or school.  You should rest at home and let your body heal.   You may shower three days after your surgery.  After showering, lightly dab your incision dry. Do not take a tub bath or go swimming until approved by your  doctor at your follow-up appointment.  If your doctor ordered a cervical collar (neck brace) for you, you should wear it whenever you are out of bed. You may remove it when lying down or sleeping, but you should wear it at all other times. Not all neck surgeries require a cervical collar.  If you smoke, we strongly recommend that you quit.  Smoking has been proven to interfere with normal bone healing and will dramatically reduce the success rate of your surgery. Please contact [facility] (800-QUIT-NOW) and use the resources at www.[facility].com for assistance in stopping smoking.  Surgical Incision   If you have a dressing on your incision, you may remove it two days after your surgery. Keep your incision area clean and dry.  If you have staples or stitches on your incision, you should have a follow up scheduled for removal. If you do not have staples or stitches, you will have steri-strips (small pieces of surgical tape) or Dermabond glue. The steri-strips/glue should begin to peel away within about a week (it is fine if the steri-strips fall off before then). If the strips are still in place one week after your surgery, you may gently remove them.  Diet           You may return to your usual diet. However, you may experience discomfort when swallowing in the first month after your surgery. This is normal. You may find that softer foods are more comfortable for you to swallow. Be sure to stay hydrated.  When to Contact Us  You may experience pain in your   neck and/or pain between your shoulder blades. This is normal and should improve in the next few weeks with the help of pain medication, muscle relaxers, and rest. Some patients report that a warm compress on the back of the neck or between the shoulder blades helps.  However, should you experience any of the following, contact us immediately: New numbness or weakness Pain that is progressively getting worse, and is not relieved by your pain  medication, muscle relaxers, rest, and warm compresses Bleeding, redness, swelling, pain, or drainage from surgical incision Chills or flu-like symptoms Fever greater than 101.0 F (38.3 C) Inability to eat, drink fluids, or take medications Problems with bowel or bladder functions Difficulty breathing or shortness of breath Warmth, tenderness, or swelling in your calf Contact Information How to contact us:  If you have any questions/concerns before or after surgery, you can reach [place] at [phone], or you can send a mychart message. We can be reached by phone or mychart 8am-4pm, Monday-Friday.  *Please note: Calls after 4pm are forwarded to a third party answering service. Mychart messages are not routinely monitored during evenings, weekends, and holidays. Please call our office to contact the answering service for urgent concerns during non-business hours.

## 2023-02-09 NOTE — Interval H&P Note (Signed)
History and Physical Interval Note:  02/09/2023 8:49 AM  Micheal Donovan  has presented today for surgery, with the diagnosis of G95.9 cervical myelopathy M48.02 cervical stenosis.  The various methods of treatment have been discussed with the patient and family. After consideration of risks, benefits and other options for treatment, the patient has consented to  Procedure(s): C3-5 ANTERIOR CERVICAL DISCECTOMY AND FUSION (N/A) as a surgical intervention.  The patient's history has been reviewed, patient examined, no change in status, stable for surgery.  I have reviewed the patient's chart and labs.  Questions were answered to the patient's satisfaction.    Heart sounds normal no MRG. Chest Clear to Auscultation Bilaterally.   Akeela Busk

## 2023-02-10 ENCOUNTER — Encounter: Payer: Self-pay | Admitting: Neurosurgery

## 2023-02-10 LAB — GLUCOSE, CAPILLARY
Glucose-Capillary: 135 mg/dL — ABNORMAL HIGH (ref 70–99)
Glucose-Capillary: 126 mg/dL — ABNORMAL HIGH (ref 70–99)
Glucose-Capillary: 96 mg/dL (ref 70–99)

## 2023-02-10 MED ORDER — SENNA 8.6 MG PO TABS: 1.0000 | ORAL_TABLET | Freq: Two times a day (BID) | ORAL | 0 refills | Status: DC | PRN

## 2023-02-10 MED ORDER — GERHARDT'S BUTT CREAM
TOPICAL_CREAM | Freq: Two times a day (BID) | CUTANEOUS | Status: DC
  Filled 2023-02-10: qty 60

## 2023-02-10 MED ORDER — CELECOXIB 200 MG PO CAPS: 200.0000 mg | ORAL_CAPSULE | Freq: Two times a day (BID) | ORAL | 0 refills | Status: DC

## 2023-02-10 MED ORDER — OXYCODONE HCL 5 MG PO TABS: 5.0000 mg | ORAL_TABLET | ORAL | 0 refills | Status: DC | PRN

## 2023-02-10 MED ORDER — GERHARDT'S BUTT CREAM: 14.0000 | TOPICAL_CREAM | Freq: Two times a day (BID) | CUTANEOUS | 0 refills | Status: DC

## 2023-02-10 MED ORDER — METHOCARBAMOL 500 MG PO TABS: 500.0000 mg | ORAL_TABLET | Freq: Four times a day (QID) | ORAL | 0 refills | Status: DC | PRN

## 2023-02-10 NOTE — Discharge Summary (Signed)
Discharge Summary  Patient ID: Micheal Donovan MRN: [id number] DOB/AGE: 72-29-1952 72 y.o.  Admit date: 02/09/2023 Discharge date: 02/10/2023  Admission Diagnoses: G95.9 cervical myelopathy, M48.02 cervical stenosis  Discharge Diagnoses:  Principal Problem:   Cervical myelopathy Micheal Donovan) Active Problems:   Cervical spinal stenosis   Discharged Condition: good  Hospital Course:  Micheal Donovan is a 72 y.o with cervical stenosis and myelopathy s/p C3-5 ACDF. His intraoperative course was uncomplicated. He was admitted overnight for drain output monitoring, pain control, and therapy evaluation. His post-op drain output was minimal and removed on the morning of POD1. He was seen and evaluated by therapy and deemed appropriate for discharge with [name]. His pain was well controlled on PO medications. He was discharged with prescriptions for Oxycodone, Celebrex, Robaxin, and Senna to take as needed.   Consults: None  Significant Diagnostic Studies: none  Treatments: surgery: as above. Please see separately dictated operative report for further details   Discharge Exam: Blood pressure (!) 93/57, pulse 67, temperature 98.6 F (37 C), resp. rate 17, height 5\' 7"  (1.702 m), weight 59.3 kg, SpO2 98%. AA Ox3 sitting up in bed attempting to have breakfast. CNI   Strength:5/5 throughout  Incision c/d/I with dermabond in place  Disposition:   Discharge Instructions     Incentive spirometry RT   Complete by: As directed       Allergies as of 02/10/2023       Reactions   Niaspan [niacin]    FLUSHING        Medication List     STOP taking these medications    oxyCODONE-acetaminophen 7.5-325 MG tablet Commonly known as: Percocet       TAKE these medications    ALPRAZolam 0.5 MG tablet Commonly known as: XANAX Take 1 tablet (0.5 mg total) by mouth at bedtime as needed for anxiety.   celecoxib 200 MG capsule Commonly known as: CeleBREX Take 1 capsule (200 mg total) by mouth 2  (two) times daily.   cephALEXin 500 MG capsule Commonly known as: KEFLEX Take 1 capsule (500 mg total) by mouth 4 (four) times daily.   citalopram 10 MG tablet Commonly known as: CELEXA Take 1 tablet (10 mg total) by mouth daily.   docusate sodium 100 MG capsule Commonly known as: COLACE Take 100 mg by mouth 2 (two) times daily as needed for mild constipation.   fentaNYL 75 MCG/HR Commonly known as: DURAGESIC Place 1 patch onto the skin every 3 (three) days.   finasteride 5 MG tablet Commonly known as: PROSCAR Take 1 tablet (5 mg total) by mouth daily.   gabapentin 300 MG capsule Commonly known as: NEURONTIN Take 2 capsules (600 mg total) by mouth 2 (two) times daily.   Gerhardt's butt cream Crea Apply 14 Applications topically 2 (two) times daily.   insulin glargine 100 UNIT/ML injection Commonly known as: LANTUS Inject 0-30 Units into the skin daily as needed (High Blood Sugar over 150).   levETIRAcetam 500 MG tablet Commonly known as: KEPPRA Take 1 tablet (500 mg total) by mouth 2 (two) times daily.   megestrol 40 MG tablet Commonly known as: MEGACE Take 1 tablet (40 mg total) by mouth daily.   methocarbamol 500 MG tablet Commonly known as: ROBAXIN Take 1 tablet (500 mg total) by mouth every 6 (six) hours as needed for muscle spasms.   NovoLOG FlexPen 100 UNIT/ML FlexPen Generic drug: insulin aspart Inject 0-10 Units into the skin daily as needed for high blood sugar (BG >  200).   omeprazole 20 MG capsule Commonly known as: PRILOSEC Take 20 mg by mouth daily.   oxyCODONE 5 MG immediate release tablet Commonly known as: Oxy IR/ROXICODONE Take 1-2 tablets (5-10 mg total) by mouth every 4 (four) hours as needed for moderate pain (pain score 4-6) or severe pain (pain score 7-10).   potassium chloride SA 20 MEQ tablet Commonly known as: KLOR-CON M Take 1 tablet (20 mEq total) by mouth daily.   senna 8.6 MG Tabs tablet Commonly known as: SENOKOT Take 1  tablet (8.6 mg total) by mouth 2 (two) times daily as needed for mild constipation.   valsartan 80 MG tablet Commonly known as: DIOVAN Take 80 mg by mouth daily.        Follow-up Information     Susanne Borders, PA Follow up on 02/19/2023.   Specialty: Neurosurgery Why: Postop follow-up Contact information: [address] [place] [address] [phone]                 Signed: Susanne Borders 02/10/2023, 1:01 PM

## 2023-02-10 NOTE — Progress Notes (Signed)
   Neurosurgery Progress Note  History: IZAIS LITTLEJOHN is a 72 y.o s/p C3-5 ACDF  POD1: He reports expected anterior neck and incision pain this morning but reports relatively good pain control with his current medication regimen.  He has not yet ambulated  Physical Exam: Vitals:   02/10/23 0554 02/10/23 0746  BP: (!) 98/57 (!) 93/57  Pulse: 80 67  Resp: 16 17  Temp: 98 F (36.7 C) 98.6 F (37 C)  SpO2: 96% 98%    AA Ox3 sitting up in bed attempting to have breakfast. CNI  Strength:5/5 throughout  Drain output 30 since surgery   Data:  Other tests/results: see results review  Assessment/Plan:  NYLEN MONTANARI is a 72 y.o presenting with cervical stenosis and myelopathy s/p C3-5 ACDF.  - mobilize - pain control - DVT prophylaxis - JP removed 02/20/23  - wound care consulted for healing stage 3 sacral decubitus ulcer. - PTOT; dispo planning underway  Manning Charity PA-C Department of Neurosurgery

## 2023-02-10 NOTE — Evaluation (Signed)
Physical Therapy Evaluation Patient Details Name: Micheal Donovan MRN: [id number] DOB: [birth year] Today's Date: 02/10/2023  History of Present Illness  Pt is a 72 y.o. male s/p ACDF C3-5 02/09/23.  PMH includes htn, CHF, HA's, seizures, CVA (L side), DM, melanoma on neck, hemorrhagic stroke, lung CA, retinopathy, L4 fx.  Clinical Impression  Prior to surgery, pt has been w/c level with functional mobility for past 3 months (was ambulatory prior to that); lives with his wife in 1 level home with ramp to enter; has caregiver 8 hours a day/5 days a week (caregiver present during session). Anterior neck pain 1/10 at rest beginning of session and 2.5/10 at rest end of session.  Currently pt is CGA with bed mobility; CGA with squat pivot transfer bed to recliner; and min assist to ambulate 10 feet with RW use (pt with very flexed posture in general requiring intermittent vc's for upright posture).  Limited activity d/t pt fatigue.  Pt would currently benefit from skilled PT to address noted impairments and functional limitations (see below for any additional details).  Pt appears close to w/c level functional baseline (for past 3 months) but appears to be motivated to work on transfers and walking to improve functional status.  Upon hospital discharge, pt would benefit from ongoing therapy.     If plan is discharge home, recommend the following: A little help with walking and/or transfers;A little help with bathing/dressing/bathroom;Assistance with cooking/housework;Assist for transportation;Help with stairs or ramp for entrance   Can travel by private vehicle        Equipment Recommendations None recommended by PT (Pt has RW and manual w/c at home already)  Recommendations for Other Services       Functional Status Assessment Patient has had a recent decline in their functional status and demonstrates the ability to make significant improvements in function in a reasonable and predictable amount of  time.     Precautions / Restrictions Precautions Precautions: Cervical;Fall Restrictions Weight Bearing Restrictions: No Other Position/Activity Restrictions: No brace needed per order      Mobility  Bed Mobility Overal bed mobility: Needs Assistance Bed Mobility: Supine to Sit, Sit to Supine     Supine to sit: Contact guard, HOB elevated Sit to supine: Contact guard assist   General bed mobility comments: increased effort/time to perform on own    Transfers Overall transfer level: Needs assistance Equipment used: Rolling walker (2 wheels), None Transfers: Sit to/from Stand, Bed to chair/wheelchair/BSC Sit to Stand: Contact guard assist     Squat pivot transfers: Contact guard assist (bed to recliner to L side)     General transfer comment: CGA to stand from recliner (vc's for UE placement); increased effort/time to perform transfers (CGA for safety)    Ambulation/Gait Ambulation/Gait assistance: Min assist Gait Distance (Feet): 10 Feet Assistive device: Rolling walker (2 wheels)   Gait velocity: decreased     General Gait Details: flexed posture (intermittent vc's for upright posture); decreased B LE step length/foot clearance; increased effort/time to take steps  Stairs            Wheelchair Mobility     Tilt Bed    Modified Rankin (Stroke Patients Only)       Balance Overall balance assessment: Needs assistance Sitting-balance support: Feet supported, Single extremity supported Sitting balance-Leahy Scale: Good Sitting balance - Comments: steady reaching within BOS with single UE support   Standing balance support: Bilateral upper extremity supported, During functional activity, Reliant on assistive device for  balance Standing balance-Leahy Scale: Fair Standing balance comment: steady static standing with B UE support through RW                             Pertinent Vitals/Pain Pain Assessment Pain Assessment: 0-10 Pain  Score: 2  Pain Location: anterior neck Pain Descriptors / Indicators: Sore, Tender Pain Intervention(s): Limited activity within patient's tolerance, Monitored during session, Premedicated before session, Repositioned Vitals (HR and SpO2 on room air) stable and WFL throughout treatment session.    Home Living Family/patient expects to be discharged to:: Private residence Living Arrangements: Spouse/significant other Available Help at Discharge: Family;Available 24 hours/day;Personal care attendant Type of Home: House Home Access: Ramped entrance       Home Layout: One level Home Equipment: Rolling Walker (2 wheels);BSC/3in1;Shower seat - built in;Wheelchair - manual (Adjustable bed) Additional Comments: Pt has caretaker 8 hours a day M-F.    Prior Function Prior Level of Function : Needs assist             Mobility Comments: Pt has not ambulated for past 3 months; has been using manual w/c (pushes self with his UE's but can use LE's if needed) ADLs Comments: Generally MOD I for ADL with significant increased time. Pt reports wife assists pt as needed.     Extremity/Trunk Assessment   Upper Extremity Assessment Upper Extremity Assessment: Generalized weakness    Lower Extremity Assessment Lower Extremity Assessment: RLE deficits/detail;LLE deficits/detail RLE Deficits / Details: hip flexion 4/5; knee flexion/extension 4/5; DF 4/5 LLE Deficits / Details: hip flexion 4-/5; knee flexion/extension 4/5; DF 4/5       Communication   Communication Communication: No apparent difficulties Cueing Techniques: Verbal cues;Visual cues  Cognition Arousal: Alert Behavior During Therapy: WFL for tasks assessed/performed Overall Cognitive Status: Within Functional Limits for tasks assessed                                          General Comments General comments (skin integrity, edema, etc.): no drainage noted anterior neck dressing.  Nursing cleared pt for  participation in physical therapy.  Pt agreeable to PT session.    Exercises     Assessment/Plan    PT Assessment Patient needs continued PT services  PT Problem List Decreased strength;Decreased activity tolerance;Decreased balance;Decreased mobility;Decreased knowledge of use of DME;Decreased knowledge of precautions;Pain;Decreased skin integrity       PT Treatment Interventions DME instruction;Gait training;Functional mobility training;Therapeutic activities;Therapeutic exercise;Balance training;Patient/family education;Wheelchair mobility training    PT Goals (Current goals can be found in the Care Plan section)  Acute Rehab PT Goals Patient Stated Goal: to go home PT Goal Formulation: With patient Time For Goal Achievement: 02/24/23 Potential to Achieve Goals: Good    Frequency 7X/week     Co-evaluation               AM-PAC PT "6 Clicks" Mobility  Outcome Measure Help needed turning from your back to your side while in a flat bed without using bedrails?: A Little Help needed moving from lying on your back to sitting on the side of a flat bed without using bedrails?: A Little Help needed moving to and from a bed to a chair (including a wheelchair)?: A Little Help needed standing up from a chair using your arms (e.g., wheelchair or bedside chair)?: A Little Help needed  to walk in hospital room?: A Little Help needed climbing 3-5 steps with a railing? : Total 6 Click Score: 16    End of Session Equipment Utilized During Treatment: Gait belt Activity Tolerance: Patient tolerated treatment well Patient left: in bed;with call bell/phone within reach;with bed alarm set;with family/visitor present;with SCD's reapplied;Other (comment) (B heels floating via pillow support) Nurse Communication: Mobility status;Precautions;Other (comment) (Pt's pain status) PT Visit Diagnosis: Other abnormalities of gait and mobility (R26.89);Muscle weakness (generalized) (M62.81);Pain Pain  - part of body:  (anterior neck)    Time: 7829-5621 PT Time Calculation (min) (ACUTE ONLY): 37 min   Charges:   PT Evaluation $PT Eval Low Complexity: 1 Low PT Treatments $Therapeutic Activity: 8-22 mins PT General Charges $$ ACUTE PT VISIT: 1 Visit        Hendricks Limes, PT 02/10/23, 2:05 PM

## 2023-02-10 NOTE — Consult Note (Addendum)
WOC Nurse Consult Note: Reason for Consult: sacral pressure injury  Wound type: 1. Healing Stage 3 to sacrum, linear  2. Moisture associated skin damage to coccyx/buttocks  ICD-10 CM Codes for Irritant Dermatitis L24A0 - Due to friction or contact with body fluids; unspecified Pressure Injury POA: Yes Measurement: 1.  Sacral PI 1 cm x 0.3 cm x 0.1 cm 100% pink moist  Wound bed: as above  Drainage (amount, consistency, odor) minimal serosanguinous  Periwound: mild erythema, maceration to coccyx, scattered partial thickness skin loss to buttocks  Dressing procedure/placement/frequency: Clean buttocks/sacrum with soap and water, dry and apply Gerhardt's Butt Cream 2 times a day and prn soiling.  May cover with silicone foam or ABD pad whichever is preferred. Leave silicone foam off if appears to be holding moisture onto area (ie., worsening maceration).   Per wife has used Medihoney in past, PI  has no necrotic tissue at the time of this visit therefore Medihoney not warranted.   POC discussed with bedside nurse. WOC team will not follow. Re-consult if further needs arise.   Thank you,    Priscella Mann MSN, RN-BC, [employer] [phone]

## 2023-02-10 NOTE — Evaluation (Signed)
Occupational Therapy Evaluation Patient Details Name: Micheal Donovan MRN: [id number] DOB: 1950-10-07 Today's Date: 02/10/2023   History of Present Illness OC LEMMEN is a 72 y.o presenting with cervical stenosis and myelopathy s/p C3-5 ACDF. PMH: CHF, DM type 2, CVA, HTN   Clinical Impression   Pt was seen for OT evaluation this date. Prior to hospital admission, pt was IND with ADL's. Pt lives with wife who provides 24/7 assistance.[name] Pt presents to acute OT demonstrating impaired ADL performance and functional mobility 2/2 (See OT problem list for additional functional deficits). Upon arrival to room pt supine in bed with sitter/aide near bedside. Pt agreeable to tx. Pt completed bed mobility with CGA+use of rails. Pt educated on cervical precautions and functional application, pt verbalized understanding. Pt completed STS x3 with MIN A +RW. Pt noted to have posterior bias and required trunk/back support. Pt completed taking side steps to the right and left with Min A+RW. Pt returned to bed. Pt left supine in bed with call bell within reach and all needs met. Pt would benefit from skilled OT services to address noted impairments and functional limitations (see below for any additional details) in order to maximize safety and independence while minimizing falls risk and caregiver burden. Anticipate the need for follow up OT services upon acute hospital DC.        If plan is discharge home, recommend the following: A little help with bathing/dressing/bathroom;A little help with walking and/or transfers;Assist for transportation;Assistance with cooking/housework;Help with stairs or ramp for entrance    Functional Status Assessment  Patient has had a recent decline in their functional status and demonstrates the ability to make significant improvements in function in a reasonable and predictable amount of time.  Equipment Recommendations  None recommended by OT    Recommendations for Other  Services       Precautions / Restrictions Precautions Precautions: Cervical;Fall Restrictions Weight Bearing Restrictions: No      Mobility Bed Mobility Overal bed mobility: Needs Assistance Bed Mobility: Supine to Sit, Sit to Supine     Supine to sit: Contact guard Sit to supine: Contact guard assist     Patient Response: Cooperative  Transfers Overall transfer level: Needs assistance Equipment used: Rolling walker (2 wheels) Transfers: Sit to/from Stand Sit to Stand: Min assist                  Balance Overall balance assessment: Needs assistance Sitting-balance support: No upper extremity supported, Feet supported Sitting balance-Leahy Scale: Good     Standing balance support: Bilateral upper extremity supported, During functional activity, Reliant on assistive device for balance Standing balance-Leahy Scale: Poor                             ADL either performed or assessed with clinical judgement   ADL Overall ADL's : Needs assistance/impaired                                     Functional mobility during ADLs: Minimal assistance;Rolling walker (2 wheels) General ADL Comments: Pt educated on cervical precautions and functional application, pt verbalized understanding. Pt completed STS x3 with MIN A +RW. Pt noted to have posterior bias and required trunk/back support. Pt completed taking side steps to the right and left with Min A+RW.     Vision  Perception         Praxis         Pertinent Vitals/Pain Pain Assessment Pain Assessment: No/denies pain     Extremity/Trunk Assessment Upper Extremity Assessment Upper Extremity Assessment: Generalized weakness   Lower Extremity Assessment Lower Extremity Assessment: Defer to PT evaluation       Communication Communication Communication: No apparent difficulties Cueing Techniques: Verbal cues;Tactile cues;Gestural cues   Cognition Arousal: Alert Behavior  During Therapy: WFL for tasks assessed/performed Overall Cognitive Status: Within Functional Limits for tasks assessed                                       General Comments       Exercises     Shoulder Instructions      Home Living Family/patient expects to be discharged to:: Private residence Living Arrangements: Spouse/significant other Available Help at Discharge: Family;Available 24 hours/day Type of Home: House Home Access: Ramped entrance     Home Layout: One level     Bathroom Shower/Tub: Arts development officer Toilet: Handicapped height     Home Equipment: Agricultural consultant (2 wheels);BSC/3in1;Shower seat - built in;Wheelchair - manual          Prior Functioning/Environment               Mobility Comments: Pt has not ambulated much since August and has been using a M WC. ADLs Comments: generally MOD I for ADL with sig increased time. Pt reports wife assist pt as needed.        OT Problem List: Decreased strength;Decreased range of motion;Decreased activity tolerance;Decreased safety awareness;Impaired balance (sitting and/or standing)      OT Treatment/Interventions: Self-care/ADL training;Therapeutic activities;Therapeutic exercise;Patient/family education;DME and/or AE instruction    OT Goals(Current goals can be found in the care plan section) Acute Rehab OT Goals Patient Stated Goal: to go home OT Goal Formulation: With patient Time For Goal Achievement: 02/24/23 Potential to Achieve Goals: Good ADL Goals Pt Will Perform Grooming: sitting;with supervision Pt Will Perform Lower Body Dressing: sitting/lateral leans;sit to/from stand;with contact guard assist Pt Will Transfer to Toilet: with contact guard assist;bedside commode;stand pivot transfer  OT Frequency: Min 1X/week    Co-evaluation              AM-PAC OT "6 Clicks" Daily Activity     Outcome Measure Help from another person eating meals?: None Help from  another person taking care of personal grooming?: A Little Help from another person toileting, which includes using toliet, bedpan, or urinal?: A Little Help from another person bathing (including washing, rinsing, drying)?: A Lot Help from another person to put on and taking off regular upper body clothing?: None Help from another person to put on and taking off regular lower body clothing?: A Lot 6 Click Score: 18   End of Session Equipment Utilized During Treatment: Rolling walker (2 wheels);Gait belt Nurse Communication: Mobility status  Activity Tolerance: Patient tolerated treatment well Patient left: in bed;with call bell/phone within reach;with bed alarm set  OT Visit Diagnosis: Unsteadiness on feet (R26.81);Other abnormalities of gait and mobility (R26.89);Muscle weakness (generalized) (M62.81)                Time: 1308-6578 OT Time Calculation (min): 31 min Charges:  OT General Charges $OT Visit: 1 Visit OT Evaluation $OT Eval Low Complexity: 1 Low OT Treatments $Self Care/Home Management : 8-22 mins  Butch Penny, SOT

## 2023-02-10 NOTE — Progress Notes (Signed)
Unable to print discharge instructions on this patient due to Case Manager needing to complete code 44 section. Patient's daughter here to pick up patient. Instructions wrote down for the patient to go home with. Patient refused to wait another night just for the printed instructions. Case Manger notified.

## 2023-02-10 NOTE — TOC Transition Note (Signed)
Transition of Care [facility]) - CM/SW Discharge Note   Patient Details  Name: Micheal Donovan MRN: [id number] Date of Birth: 11-09-1950  [facility] of [facility] CM/SW Contact:  Garret Reddish, RN Phone Number: 02/10/2023, 4:27 PM   Clinical Narrative:    Chart reviewed.  Noted that patient will be a tentative discharge for today.    I have spoken with patient 's wife Mrs. Welsch.  I have informed her that patient will need Home Health on discharge.  Mrs. Luepke did not have a home health preference.  I have asked Kandee Keen with [facility] to accept home health referral.  [facility] will provide PT/OT service for Mr. Arana.  Mrs. Bovee reports that she will have a nurse assisting with Mr. Tiano care for a few weeks on discharge M-F.    Patient has a manual wheelchair, RW, and elevated toilet seat at home.   I have informed staff nurse of the above information.    Final next level of care: Home w Home Health Services Barriers to Discharge: No Barriers Identified   Patient Goals and CMS Choice CMS Medicare.gov Compare Post Acute Care list provided to:: Patient Represenative (must comment) (Patient's wife) Choice offered to / list presented to : Spouse  Discharge Placement                    Name of family member notified: Mrs. Constantinides ( patient's spouse) Patient and family notified of of transfer: 02/10/23  Discharge Plan and Services Additional resources added to the After Visit Summary for                  DME Arranged:  (Patient has a manual wheelchair, RW, and elevated toilet seat at home.)         HH Arranged: PT, OT HH Agency: [facility] Date [facility] Agency Contacted: 02/10/23   Representative spoke with at [facility] Agency: Kandee Keen  Social Determinants of Health (SDOH) Interventions SDOH Screenings   Food Insecurity: No Food Insecurity (02/09/2023)  Housing: Low Risk  (02/09/2023)  Transportation Needs: No Transportation Needs (02/09/2023)  Utilities: Not At Risk (02/09/2023)   Alcohol Screen: Low Risk  (03/27/2022)  Depression (PHQ2-9): High Risk (04/21/2022)  Financial Resource Strain: Low Risk  (04/17/2022)  Physical Activity: Inactive (03/27/2022)  Social Connections: Unknown (11/17/2022)   Received from [facility]  Stress: Stress Concern Present (03/27/2022)  Tobacco Use: Medium Risk (02/09/2023)     Readmission Risk Interventions    11/20/2021   10:17 AM  Readmission Risk Prevention Plan  Transportation Screening Complete  PCP or Specialist Appt within 3-5 Days Complete  HRI or Home Care Consult --  Social Work Consult for Recovery Care Planning/Counseling --  Palliative Care Screening Not Applicable  Medication Review Oceanographer) Complete

## 2023-02-11 ENCOUNTER — Ambulatory Visit: Admission: RE | Admit: 2023-02-11 | Payer: PPO | Source: Ambulatory Visit

## 2023-02-12 DIAGNOSIS — C3490 Malignant neoplasm of unspecified part of unspecified bronchus or lung: Secondary | ICD-10-CM | POA: Diagnosis not present

## 2023-02-12 DIAGNOSIS — D630 Anemia in neoplastic disease: Secondary | ICD-10-CM | POA: Diagnosis not present

## 2023-02-12 DIAGNOSIS — Z794 Long term (current) use of insulin: Secondary | ICD-10-CM | POA: Diagnosis not present

## 2023-02-12 DIAGNOSIS — I10 Essential (primary) hypertension: Secondary | ICD-10-CM | POA: Diagnosis not present

## 2023-02-12 DIAGNOSIS — Z4789 Encounter for other orthopedic aftercare: Secondary | ICD-10-CM | POA: Diagnosis not present

## 2023-02-12 DIAGNOSIS — Z9181 History of falling: Secondary | ICD-10-CM | POA: Diagnosis not present

## 2023-02-12 DIAGNOSIS — E119 Type 2 diabetes mellitus without complications: Secondary | ICD-10-CM | POA: Diagnosis not present

## 2023-02-12 DIAGNOSIS — I959 Hypotension, unspecified: Secondary | ICD-10-CM | POA: Diagnosis not present

## 2023-02-12 DIAGNOSIS — M4802 Spinal stenosis, cervical region: Secondary | ICD-10-CM | POA: Diagnosis not present

## 2023-02-12 DIAGNOSIS — Z981 Arthrodesis status: Secondary | ICD-10-CM | POA: Diagnosis not present

## 2023-02-12 DIAGNOSIS — G959 Disease of spinal cord, unspecified: Secondary | ICD-10-CM | POA: Diagnosis not present

## 2023-02-12 DIAGNOSIS — I69354 Hemiplegia and hemiparesis following cerebral infarction affecting left non-dominant side: Secondary | ICD-10-CM | POA: Diagnosis not present

## 2023-02-13 ENCOUNTER — Emergency Department: Payer: PPO

## 2023-02-13 ENCOUNTER — Emergency Department
Admission: EM | Admit: 2023-02-13 | Discharge: 2023-02-14 | Disposition: A | Discharge status: Home / Self Care | Payer: PPO | Attending: Emergency Medicine | Admitting: Emergency Medicine

## 2023-02-13 ENCOUNTER — Other Ambulatory Visit: Payer: Self-pay

## 2023-02-13 DIAGNOSIS — I4510 Unspecified right bundle-branch block: Secondary | ICD-10-CM | POA: Insufficient documentation

## 2023-02-13 DIAGNOSIS — I444 Left anterior fascicular block: Secondary | ICD-10-CM | POA: Diagnosis not present

## 2023-02-13 DIAGNOSIS — R918 Other nonspecific abnormal finding of lung field: Secondary | ICD-10-CM | POA: Diagnosis not present

## 2023-02-13 DIAGNOSIS — R531 Weakness: Secondary | ICD-10-CM

## 2023-02-13 DIAGNOSIS — Z85118 Personal history of other malignant neoplasm of bronchus and lung: Secondary | ICD-10-CM | POA: Diagnosis not present

## 2023-02-13 DIAGNOSIS — I959 Hypotension, unspecified: Secondary | ICD-10-CM | POA: Insufficient documentation

## 2023-02-13 LAB — CBC WITH DIFFERENTIAL/PLATELET
Abs Immature Granulocytes: 0.07 10*3/uL (ref 0.00–0.07)
Basophils Absolute: 0 10*3/uL (ref 0.0–0.1)
Basophils Relative: 1 %
Eosinophils Absolute: 0.5 10*3/uL (ref 0.0–0.5)
Eosinophils Relative: 7 %
HCT: 38.5 % — ABNORMAL LOW (ref 39.0–52.0)
Hemoglobin: 12.1 g/dL — ABNORMAL LOW (ref 13.0–17.0)
Immature Granulocytes: 1 %
Lymphocytes Relative: 11 %
Lymphs Abs: 0.8 10*3/uL (ref 0.7–4.0)
MCH: 30.5 pg (ref 26.0–34.0)
MCHC: 31.4 g/dL (ref 30.0–36.0)
MCV: 97 fL (ref 80.0–100.0)
Monocytes Absolute: 0.5 10*3/uL (ref 0.1–1.0)
Monocytes Relative: 7 %
Neutro Abs: 5.5 10*3/uL (ref 1.7–7.7)
Neutrophils Relative %: 73 %
Platelets: 411 10*3/uL — ABNORMAL HIGH (ref 150–400)
RBC: 3.97 MIL/uL — ABNORMAL LOW (ref 4.22–5.81)
RDW: 13.6 % (ref 11.5–15.5)
WBC: 7.4 10*3/uL (ref 4.0–10.5)
nRBC: 0 % (ref 0.0–0.2)

## 2023-02-13 LAB — COMPREHENSIVE METABOLIC PANEL
ALT: 8 U/L (ref 0–44)
AST: 16 U/L (ref 15–41)
Albumin: 3.7 g/dL (ref 3.5–5.0)
Alkaline Phosphatase: 65 U/L (ref 38–126)
Anion gap: 13 (ref 5–15)
BUN: UNDETERMINED mg/dL (ref 8–23)
CO2: 17 mmol/L — ABNORMAL LOW (ref 22–32)
Calcium: 8.6 mg/dL — ABNORMAL LOW (ref 8.9–10.3)
Chloride: 100 mmol/L (ref 98–111)
Creatinine, Ser: UNDETERMINED mg/dL (ref 0.61–1.24)
Glucose, Bld: 119 mg/dL — ABNORMAL HIGH (ref 70–99)
Potassium: 4.4 mmol/L (ref 3.5–5.1)
Sodium: 130 mmol/L — ABNORMAL LOW (ref 135–145)
Total Bilirubin: 1 mg/dL (ref <1.2)
Total Protein: 7.3 g/dL (ref 6.5–8.1)

## 2023-02-13 LAB — PROTIME-INR
INR: 1 (ref 0.8–1.2)
Prothrombin Time: 13.7 s (ref 11.4–15.2)

## 2023-02-13 LAB — BUN: BUN: 25 mg/dL — ABNORMAL HIGH (ref 8–23)

## 2023-02-13 LAB — CREATININE, SERUM
Creatinine, Ser: 1.21 mg/dL (ref 0.61–1.24)
GFR, Estimated: >60 mL/min (ref >60)

## 2023-02-13 LAB — LACTIC ACID, PLASMA: Lactic Acid, Venous: 1.8 mmol/L (ref 0.5–1.9)

## 2023-02-13 MED ORDER — SODIUM CHLORIDE 0.9 % IV BOLUS (SEPSIS)
1000.0000 mL | Freq: Once | INTRAVENOUS | Status: AC
  Administered 2023-02-13: 1000 mL via INTRAVENOUS

## 2023-02-13 MED ORDER — SODIUM CHLORIDE 0.9 % IV SOLN
2.0000 g | INTRAVENOUS | Status: DC
  Administered 2023-02-13: 2 g via INTRAVENOUS
  Filled 2023-02-13: qty 20

## 2023-02-13 NOTE — ED Provider Notes (Signed)
[facility] Provider Note    Event Date/Time   First MD Initiated Contact with Patient 02/13/23 2032     (approximate)   History   Hypotension   HPI  Micheal Donovan is a 72 year old male with history of metastatic lung cancer, cervical surgery on 12/9 for cervical myelopathy presenting to the emergency department for evaluation of lethargy and hypotension.  Patient was working with a physical therapist today where he was noted to be significantly hypotensive with a systolic blood pressure of 60.  Has had decreased energy.  No reported confusion.  Denies neck pain, new numbness, tingling, focal weakness.  Does report some decreased p.o. intake.  Does also report a history of low blood pressure in the past, unknown cause.  No fevers or chills.    Physical Exam   Triage Vital Signs: ED Triage Vitals  Encounter Vitals Group     BP 02/13/23 1918 (!) 75/45     Systolic BP Percentile --      Diastolic BP Percentile --      Pulse Rate 02/13/23 1918 95     Resp 02/13/23 1918 20     Temp 02/13/23 1918 98.1 F (36.7 C)     Temp Source 02/13/23 1918 Oral     SpO2 02/13/23 1918 98 %     Weight --      Height --      Head Circumference --      Peak Flow --      Pain Score 02/13/23 1932 6     Pain Loc --      Pain Education --      Exclude from Growth Chart --     Most recent vital signs: Vitals:   02/13/23 2330 02/13/23 2345  BP: 119/65   Pulse: 79 74  Resp: 13 12  Temp: 98 F (36.7 C)   SpO2: 100% 100%     General: Awake, interactive  CV:  Regular rate, good peripheral perfusion at the time of my initial evaluation Resp:  Unlabored respirations, lungs clear to auscultation Abd:  Nondistended, soft, no significant tenderness to palpation Neuro:  Symmetric facial movement, fluid speech, generalized weakness of the bilateral upper and lower extremities Skin:  Small area of skin breakdown over the sacral region without surrounding erythema, drainage,  overall superficial without evidence of superimposed infection  ED Results / Procedures / Treatments   Labs (all labs ordered are listed, but only abnormal results are displayed) Labs Reviewed  COMPREHENSIVE METABOLIC PANEL - Abnormal; Notable for the following components:      Result Value   Sodium 130 (*)    CO2 17 (*)    Glucose, Bld 119 (*)    Calcium 8.6 (*)    All other components within normal limits  CBC WITH DIFFERENTIAL/PLATELET - Abnormal; Notable for the following components:   RBC 3.97 (*)    Hemoglobin 12.1 (*)    HCT 38.5 (*)    Platelets 411 (*)    All other components within normal limits  BUN - Abnormal; Notable for the following components:   BUN 25 (*)    All other components within normal limits  CULTURE, BLOOD (ROUTINE X 2)  CULTURE, BLOOD (ROUTINE X 2)  LACTIC ACID, PLASMA  PROTIME-INR  CREATININE, SERUM  URINALYSIS, W/ REFLEX TO CULTURE (INFECTION SUSPECTED)     EKG EKG independently reviewed interpreted by myself (ER attending) demonstrates:    RADIOLOGY Imaging independently reviewed and interpreted by  myself demonstrates:  Chest x-Avy Barlett without focal consolidation  PROCEDURES:  Critical Care performed: Yes, see critical care procedure note(s)  CRITICAL CARE Performed by: Trinna Post   Total critical care time: 30 minutes  Critical care time was exclusive of separately billable procedures and treating other patients.  Critical care was necessary to treat or prevent imminent or life-threatening deterioration.  Critical care was time spent personally by me on the following activities: development of treatment plan with patient and/or surrogate as well as nursing, discussions with consultants, evaluation of patient's response to treatment, examination of patient, obtaining history from patient or surrogate, ordering and performing treatments and interventions, ordering and review of laboratory studies, ordering and review of radiographic  studies, pulse oximetry and re-evaluation of patient's condition.   Procedures   MEDICATIONS ORDERED IN ED: Medications  cefTRIAXone (ROCEPHIN) 2 g in sodium chloride 0.9 % 100 mL IVPB (0 g Intravenous Stopped 02/13/23 2207)  sodium chloride 0.9 % bolus 1,000 mL (0 mLs Intravenous Stopped 02/13/23 2207)    And  sodium chloride 0.9 % bolus 1,000 mL (0 mLs Intravenous Stopped 02/13/23 2213)     IMPRESSION / MDM / ASSESSMENT AND PLAN / ED COURSE  I reviewed the triage vital signs and the nursing notes.  Differential diagnosis includes, but is not limited to, hypovolemia, UTI, pneumonia, anemia, electrolyte abnormality  Patient's presentation is most consistent with acute presentation with potential threat to life or bodily function.  72 year old male presenting to the emergency department for evaluation of hypotension and fatigue.  Hypotensive in triage, but improved at the time of my initial evaluation in her room.  Sepsis orders were initiated given heart rate in the 90s and hypotension on presentation.  Patient was ordered for 30 cc/kg of fluid.  Labs returned overall reassuring with stable anemia, normal creatinine.  Lactate within normal limits.  Given recent urine infection, patient was ordered for empiric Rocephin.  While in the ER, patient's blood pressure remained significantly improved.  Suspect possible hypovolemic shock as the etiology of patient's hypotension, lower suspicion for septic shock with normal white blood cell count and lactate.  Urinalysis is pending.  Signed out to oncoming physician pending urine and reevaluation.     FINAL CLINICAL IMPRESSION(S) / ED DIAGNOSES   Final diagnoses:  Weakness  Hypotension, unspecified hypotension type     Rx / DC Orders   ED Discharge Orders     None        Note:  This document was prepared using Dragon voice recognition software and may include unintentional dictation errors.   Trinna Post, MD 02/14/23 [phone]

## 2023-02-13 NOTE — Sepsis Progress Note (Signed)
Elink monitoring for the code sepsis protocol.  

## 2023-02-13 NOTE — ED Triage Notes (Signed)
Pt states that his BP has been low for the past few months, today his physical therapist came to the house and his pt was 60 systolic, family reports more lethargic, has surgery her on the 9th, pt is a cancer pt as well.

## 2023-02-13 NOTE — Consult Note (Signed)
CODE SEPSIS - PHARMACY COMMUNICATION  **Broad Spectrum Antibiotics should be administered within 1 hour of Sepsis diagnosis**  Time Code Sepsis Called/Page Received: 2049  Antibiotics Ordered: ceftriaxone  Time of 1st antibiotic administration: 2110  Additional action taken by pharmacy: none     Ronnald Ramp ,PharmD Clinical Pharmacist  02/13/2023  9:12 PM

## 2023-02-14 LAB — URINALYSIS, W/ REFLEX TO CULTURE (INFECTION SUSPECTED)
Bilirubin Urine: NEGATIVE
Glucose, UA: NEGATIVE mg/dL
Ketones, ur: NEGATIVE mg/dL
Nitrite: NEGATIVE
Protein, ur: 30 mg/dL — AB
Specific Gravity, Urine: 1.004 — ABNORMAL LOW (ref 1.005–1.030)
WBC, UA: >50 WBC/hpf (ref 0–5)
pH: 6 (ref 5.0–8.0)

## 2023-02-14 MED ORDER — CEFDINIR 300 MG PO CAPS: 300.0000 mg | ORAL_CAPSULE | Freq: Two times a day (BID) | ORAL | 0 refills | Status: AC

## 2023-02-14 NOTE — ED Provider Notes (Signed)
Patient received in signout from Dr. Rosalia Hammers pending urinalysis and reevaluation.  Patient seen for hypotension, fluid responsive.   His urine returns with infectious features and we will send this for culture. I review of previous urine culture from 2 weeks ago where he was seen at [facility], no particular organism grew and he was prescribed Keflex.  Patient reports he only took a couple days of the Keflex and has not restarted it since his outpatient ACDF.  Has most of but still at home.  Patient reports feeling well and has no symptoms, remains normotensive and he is requesting to go home.  I considered observation admission for this patient but he has no desire for this.  We discussed escalating antibiotics to cefdinir.  We discussed close ED return precautions.   Delton Prairie, MD 02/14/23 [phone]

## 2023-02-14 NOTE — Discharge Instructions (Signed)
Stop the antibiotics you have at home (cephalexin/Keflex)  Start the medicine that we have prescribed (cefdinir/Omnicef) in its place to treat a urinary infection.  Finish this whole prescription of cefdinir/Omnicef

## 2023-02-15 ENCOUNTER — Encounter: Payer: Self-pay | Admitting: Oncology

## 2023-02-15 LAB — URINE CULTURE

## 2023-02-16 ENCOUNTER — Inpatient Hospital Stay: Payer: PPO | Admitting: Hospice and Palliative Medicine

## 2023-02-16 ENCOUNTER — Other Ambulatory Visit: Payer: Self-pay | Admitting: Oncology

## 2023-02-16 ENCOUNTER — Telehealth: Payer: Self-pay | Admitting: Neurosurgery

## 2023-02-16 DIAGNOSIS — G893 Neoplasm related pain (acute) (chronic): Secondary | ICD-10-CM

## 2023-02-16 DIAGNOSIS — F419 Anxiety disorder, unspecified: Secondary | ICD-10-CM

## 2023-02-16 DIAGNOSIS — C3412 Malignant neoplasm of upper lobe, left bronchus or lung: Secondary | ICD-10-CM

## 2023-02-16 MED ORDER — FENTANYL 75 MCG/HR TD PT72: 1.0000 | MEDICATED_PATCH | TRANSDERMAL | 0 refills | Status: DC

## 2023-02-16 NOTE — Progress Notes (Signed)
Virtual Visit via Telephone Note  I connected with Micheal Donovan on 02/16/23 at  3:20 PM EST by telephone and verified that I am speaking with the correct person using two identifiers.  Location: Patient: Home Provider: Clinic   I discussed the limitations, risks, security and privacy concerns of performing an evaluation and management service by telephone and the availability of in person appointments. I also discussed with the patient that there may be a patient responsible charge related to this service. The patient expressed understanding and agreed to proceed.   History of Present Illness: Micheal Donovan is a 72 y.o. male with multiple medical problems including non-small cell lung cancer, history of subacute intraparenchymal hemorrhage, PE.  Patient is status post XRT.  He is on systemic chemo.  He is referred to palliative care to address goals and manage ongoing symptoms.    Observations/Objective: Telephone visit today.  Patient with cervical decompression on 02/09/2023.  Patient reports that his pain is greatly improved postoperatively.  He says that he is still using the transdermal fentanyl patch but now only intermittently requiring oxycodone.  Patient was seen last week in the emergency department for UTI.  Now on antibiotics the patient says that his dysuria and other urinary symptoms have also greatly improved.  Patient denies acute changes or concerns today.  Assessment and Plan: Neoplasm related pain -continue fentanyl/Percocet.  Refill fentanyl.  Anxiety -continue citalopram 10 mg daily.  Follow Up Instructions: Follow-up telephone visit 1-2 months   I discussed the assessment and treatment plan with the patient. The patient was provided an opportunity to ask questions and all were answered. The patient agreed with the plan and demonstrated an understanding of the instructions.   The patient was advised to call back or seek an in-person evaluation if the symptoms  worsen or if the condition fails to improve as anticipated.  I provided 5 minutes of non-face-to-face time during this encounter.   [name], NP

## 2023-02-16 NOTE — Telephone Encounter (Signed)
Patient went to ED on 02/13/2023.

## 2023-02-16 NOTE — Telephone Encounter (Signed)
  Media Information   FYI

## 2023-02-17 ENCOUNTER — Encounter: Payer: Self-pay | Admitting: Oncology

## 2023-02-17 ENCOUNTER — Other Ambulatory Visit: Payer: Self-pay | Admitting: Anesthesiology

## 2023-02-17 ENCOUNTER — Inpatient Hospital Stay: Payer: PPO

## 2023-02-17 ENCOUNTER — Encounter: Payer: Self-pay | Admitting: Cardiovascular Disease

## 2023-02-17 ENCOUNTER — Inpatient Hospital Stay (HOSPITAL_BASED_OUTPATIENT_CLINIC_OR_DEPARTMENT_OTHER): Payer: PPO | Admitting: Oncology

## 2023-02-17 VITALS — BP 118/73 | HR 91 | Temp 96.8°F | Resp 18 | Wt 130.0 lb

## 2023-02-17 VITALS — BP 147/63 | HR 69 | Resp 16

## 2023-02-17 DIAGNOSIS — C3412 Malignant neoplasm of upper lobe, left bronchus or lung: Secondary | ICD-10-CM | POA: Diagnosis not present

## 2023-02-17 DIAGNOSIS — Z5112 Encounter for antineoplastic immunotherapy: Secondary | ICD-10-CM | POA: Diagnosis not present

## 2023-02-17 DIAGNOSIS — R29898 Other symptoms and signs involving the musculoskeletal system: Secondary | ICD-10-CM | POA: Diagnosis not present

## 2023-02-17 DIAGNOSIS — C7951 Secondary malignant neoplasm of bone: Secondary | ICD-10-CM | POA: Diagnosis not present

## 2023-02-17 DIAGNOSIS — I619 Nontraumatic intracerebral hemorrhage, unspecified: Secondary | ICD-10-CM | POA: Diagnosis not present

## 2023-02-17 DIAGNOSIS — G629 Polyneuropathy, unspecified: Secondary | ICD-10-CM | POA: Diagnosis not present

## 2023-02-17 DIAGNOSIS — N390 Urinary tract infection, site not specified: Secondary | ICD-10-CM | POA: Insufficient documentation

## 2023-02-17 DIAGNOSIS — E1142 Type 2 diabetes mellitus with diabetic polyneuropathy: Secondary | ICD-10-CM

## 2023-02-17 LAB — CBC WITH DIFFERENTIAL/PLATELET
Abs Immature Granulocytes: 0.07 10*3/uL (ref 0.00–0.07)
Basophils Absolute: 0.1 10*3/uL (ref 0.0–0.1)
Basophils Relative: 1 %
Eosinophils Absolute: 0.6 10*3/uL — ABNORMAL HIGH (ref 0.0–0.5)
Eosinophils Relative: 7 %
HCT: 34.5 % — ABNORMAL LOW (ref 39.0–52.0)
Hemoglobin: 11.5 g/dL — ABNORMAL LOW (ref 13.0–17.0)
Immature Granulocytes: 1 %
Lymphocytes Relative: 9 %
Lymphs Abs: 0.8 10*3/uL (ref 0.7–4.0)
MCH: 30.7 pg (ref 26.0–34.0)
MCHC: 33.3 g/dL (ref 30.0–36.0)
MCV: 92.2 fL (ref 80.0–100.0)
Monocytes Absolute: 0.5 10*3/uL (ref 0.1–1.0)
Monocytes Relative: 6 %
Neutro Abs: 6.4 10*3/uL (ref 1.7–7.7)
Neutrophils Relative %: 76 %
Platelets: 359 10*3/uL (ref 150–400)
RBC: 3.74 MIL/uL — ABNORMAL LOW (ref 4.22–5.81)
RDW: 13.3 % (ref 11.5–15.5)
WBC: 8.4 10*3/uL (ref 4.0–10.5)
nRBC: 0 % (ref 0.0–0.2)

## 2023-02-17 LAB — COMPREHENSIVE METABOLIC PANEL
ALT: 11 U/L (ref 0–44)
AST: 13 U/L — ABNORMAL LOW (ref 15–41)
Albumin: 3.6 g/dL (ref 3.5–5.0)
Alkaline Phosphatase: 60 U/L (ref 38–126)
Anion gap: 8 (ref 5–15)
BUN: 23 mg/dL (ref 8–23)
CO2: 23 mmol/L (ref 22–32)
Calcium: 9.1 mg/dL (ref 8.9–10.3)
Chloride: 105 mmol/L (ref 98–111)
Creatinine, Ser: 0.95 mg/dL (ref 0.61–1.24)
GFR, Estimated: >60 mL/min (ref >60)
Glucose, Bld: 123 mg/dL — ABNORMAL HIGH (ref 70–99)
Potassium: 4.7 mmol/L (ref 3.5–5.1)
Sodium: 136 mmol/L (ref 135–145)
Total Bilirubin: 0.5 mg/dL (ref <1.2)
Total Protein: 7.1 g/dL (ref 6.5–8.1)

## 2023-02-17 MED ORDER — SODIUM CHLORIDE 0.9 % IV SOLN
Freq: Once | INTRAVENOUS | Status: AC
  Filled 2023-02-17: qty 250

## 2023-02-17 MED ORDER — SODIUM CHLORIDE 0.9 % IV SOLN
200.0000 mg | Freq: Once | INTRAVENOUS | Status: AC
  Administered 2023-02-17: 200 mg via INTRAVENOUS
  Filled 2023-02-17: qty 200

## 2023-02-17 MED ORDER — HEPARIN SOD (PORK) LOCK FLUSH 100 UNIT/ML IV SOLN
500.0000 [IU] | Freq: Once | INTRAVENOUS | Status: AC | PRN
  Administered 2023-02-17: 500 [IU]
  Filled 2023-02-17: qty 5

## 2023-02-17 NOTE — Assessment & Plan Note (Signed)
Immunotherapy plan as listed above 

## 2023-02-17 NOTE — Assessment & Plan Note (Signed)
Recurrent UTI, increased frequency since the utilization of condom catheter.   Recent urine culture + multiple bacteria.  Refer to urology for evaluation.

## 2023-02-17 NOTE — Assessment & Plan Note (Addendum)
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  S/p lung radiation to left lung.  12/19/2022, CT chest stable disease.  Labs are reviewed and discussed with patient. Proceed with Keytruda  Recommend patient to finish antibiotics.  Reschedule CT chest abdomen pelvis wo contrast

## 2023-02-17 NOTE — Patient Instructions (Signed)
 [facility] - A DEPT OF [facility]  Discharge Instructions: Thank you for choosing [facility] to provide your oncology and hematology care.  If you have a lab appointment with the Cancer Center, please go directly to the Cancer Center and check in at the registration area.  Wear comfortable clothing and clothing appropriate for easy access to any Portacath or PICC line.   We strive to give you quality time with your provider. You may need to reschedule your appointment if you arrive late (15 or more minutes).  Arriving late affects you and other patients whose appointments are after yours.  Also, if you miss three or more appointments without notifying the office, you may be dismissed from the clinic at the provider's discretion.      For prescription refill requests, have your pharmacy contact our office and allow 72 hours for refills to be completed.    Today you received the following chemotherapy and/or immunotherapy agents Keytruda      To help prevent nausea and vomiting after your treatment, we encourage you to take your nausea medication as directed.  BELOW ARE SYMPTOMS THAT SHOULD BE REPORTED IMMEDIATELY: *FEVER GREATER THAN 100.4 F (38 C) OR HIGHER *CHILLS OR SWEATING *NAUSEA AND VOMITING THAT IS NOT CONTROLLED WITH YOUR NAUSEA MEDICATION *UNUSUAL SHORTNESS OF BREATH *UNUSUAL BRUISING OR BLEEDING *URINARY PROBLEMS (pain or burning when urinating, or frequent urination) *BOWEL PROBLEMS (unusual diarrhea, constipation, pain near the anus) TENDERNESS IN MOUTH AND THROAT WITH OR WITHOUT PRESENCE OF ULCERS (sore throat, sores in mouth, or a toothache) UNUSUAL RASH, SWELLING OR PAIN  UNUSUAL VAGINAL DISCHARGE OR ITCHING   Items with * indicate a potential emergency and should be followed up as soon as possible or go to the Emergency Department if any problems should occur.  Please show the CHEMOTHERAPY ALERT CARD or IMMUNOTHERAPY  ALERT CARD at check-in to the Emergency Department and triage nurse.  Should you have questions after your visit or need to cancel or reschedule your appointment, please contact [facility] - A DEPT OF [facility]  [phone] and follow the prompts.  Office hours are 8:00 a.m. to 4:30 p.m. Monday - Friday. Please note that voicemails left after 4:00 p.m. may not be returned until the following business day.  We are closed weekends and major holidays. You have access to a nurse at all times for urgent questions. Please call the main number to the clinic [phone] and follow the prompts.  For any non-urgent questions, you may also contact your provider using MyChart. We now offer e-Visits for anyone 42 and older to request care online for non-urgent symptoms. For details visit [email or web address].   Also download the MyChart app! Go to the app store, search "MyChart", open the app, select [place], and log in with your MyChart username and password.

## 2023-02-17 NOTE — Assessment & Plan Note (Signed)
Fingertip neuropathy intermittent Grade 1, feet neuropathy persistent, Grade 2.  On Gabapentin 300mg  TID

## 2023-02-17 NOTE — Assessment & Plan Note (Signed)
 Hold off Eliquis per neurology Dr. Barbaraann Cao 10/02/22 Repeat MRI brain-stable findings Repeat MRI brain

## 2023-02-17 NOTE — Progress Notes (Signed)
Hematology/Oncology Progress note [phone] [phone]       CHIEF COMPLAINTS/REASON FOR VISIT:  Metastatic non-small cell lung cancer  ASSESSMENT & PLAN:   Cancer Staging  Primary non-small cell carcinoma of upper lobe of left lung (HCC) Staging form: Lung, AJCC 8th Edition - Clinical: Stage IV (cT2b, cN1, cM1) - Signed by [name], MD on 08/06/2021   Primary non-small cell carcinoma of upper lobe of left lung (HCC) Stage IV lung adenocarcinoma with brain and bone metastasis.  S/p lung radiation to left lung.  12/19/2022, CT chest stable disease.  Labs are reviewed and discussed with patient. Proceed with Keytruda  Recommend patient to finish antibiotics.  Reschedule CT chest abdomen pelvis wo contrast   Encounter for antineoplastic immunotherapy Immunotherapy plan as listed above  Intraparenchymal hemorrhage of brain (HCC) Hold off Eliquis per neurology Dr. Barbaraann Cao 10/02/22 Repeat MRI brain-stable findings Repeat MRI brain   Metastasis to bone (HCC) Recommend Zometa every 4- 6 weeks.-  hold off today due to possible upcoming surgery Continue calcium supplementation.  MRI lumbar with and without contrast showed  Stable remote superior endplate fracture of L2 and large Schmorl to noted involving L5. 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 PET scan shows stable bone lesions.  L4 fracture uptake likely benign- s/p nerve block - pending neurosurgery evaluation.  Hold off Zometa given possibility of recent back surgery.  Neuropathy Fingertip neuropathy intermittent Grade 1, feet neuropathy persistent, Grade 2.  On Gabapentin 300mg  TID     Weakness of left lower extremity Acute on chronic. Currently on Keppra for presumed seizure. Follow-up with neurology.  S/p decompression for cervical stenosis   UTI (urinary tract infection) Recurrent UTI, increased frequency since the  utilization of condom catheter.   Recent urine culture + multiple bacteria.  Refer to urology for evaluation.      Follow up LOS All questions were answered. The patient knows to call the clinic with any problems, questions or concerns.  [name], MD, [name] [facility] Hematology Oncology 02/17/2023      HISTORY OF PRESENTING ILLNESS:   Micheal Donovan is a  72 y.o.  male presents for follow up of Non-small cell lung cancer.  Oncology History Overview Note  Diagnosis: Stage IIB T2b N1 M0 adenocarcinoma of the LUL, poorly differentiated     Primary non-small cell carcinoma of upper lobe of left lung (HCC)  06/13/2021 Initial Diagnosis   Primary non-small cell carcinoma of upper lobe of left lung [facility]) -06/20/2021 - 06/27/2021, patient presented to [facility] due to progressive headache/dizziness/gait changes. CT head showed acute to subacute intraparenchymal hemorrhage involving the left cerebellum.  Surrounding low-density vasogenic edema.  Patient was transferred to [facility]. This was further evaluated by CT angiogram of the neck which showed bulky calcified plaques/stenosis of carotid artery, Followed by MRI brain. 06/12/2021, MRI of the brain showed no significant interval change in size of the left cerebellar intraparenchymal hematoma with unchanged regional mass effect and partial effacement of fourth ventricle but no upstream hydrocephalus. There is no discernible enhancement to suggest underlying mass lesion, though acute blood products could mask enhancement.   06/12/2021 a chest x-ray showed a 4.4 cm left middle lobe. 06/13/2021, CT chest with contrast showed a 4.3 x 3.6 x 3.3 cm lobular spiculated mass in the posterior left upper lobe with T3 to the lateral pleura and major fissure.  Metastatic left hilar lymphadenopathy.  Peripheral micronodularity  posterior right costophrenic sulcus.  Aortic atherosclerosis. 06/14/2021, CT abdomen pelvis showed right lower lobe pulmonary  artery embolus.  No evidence of right heart strain.  No acute intra-abdominal or pelvic pathology.  Aortic atherosclerosis.  06/13/2021, patient underwent bronchoscopy with EBUS by Dr. Katrinka Blazing.  Biopsy from the fine-needle aspiration of station 11 mL showed malignant cells, consistent with poorly differentiated non-small cell carcinoma, consistent with adenocarcinoma.  Malignant cells are TTF-1 positive and negative for p40.  Negative for neuroendocrine markers.  Blood test and tissue sample were tested for Gardant 360  -PD-L1 TPS 97%, no actionable mutation on the blood testing. 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   07/17/2021 Cancer Staging   Staging form: Lung, AJCC 8th Edition - Clinical: Stage IV (cT2b, cN1, cM1) - Signed by [name], MD on 08/06/2021   08/06/2021 Imaging   I saw patient's PET scan after his visit with me on 08/06/21. PET scan was ordered by his previous oncologist group and did not come to my in basket.. Patient received cycle 1 carboplatin Taxol today.  He has started on radiation.  5/26/3 PET scan showed 3.8 cm hypermetabolic left upper lobe mass with hypermetabolic left hilar and infrahilar adenopathy.  There is approximately 8 scattered metastatic lesions in the skeleton. Mixed density photopenic lesion in the left cerebellum. characterized as hemorrhage on recent prior imaging workups.    08/16/2021 -  Chemotherapy   Patient is on Treatment Plan : LUNG Carboplatin (5) + Pemetrexed + Pembrolizumab (200) D1 q21d Induction x 4 cycles / Maintenance Pemetrexed + Pembrolizumab (200) D1 q21d      08/22/2021 Imaging   MRI brain w wo contrast  Decrease in size of left cerebellar hematoma with resolution of edema. No evidence of underlying lesion. Smaller, more recent hemorrhage in the left cerebellar vermis with minimal edema. There is minimal enhancement without definite evidence of underlying lesion.  New punctate focus of chronic blood products and enhancement  in the left frontal lobe. Additional new foci of chronic blood products in the posterior right putamen, right parietal subcortical white matter, and posterior right cerebellum. Unclear at this time if these represent foci of bland hemorrhage or early metastases.   Increase in size of right parietal osseous metastasis with minor extraosseous extension.     Imaging   PET scan showed 1. Interval response to therapy as evidenced by a small residual left upper lobe nodule with decreased hypermetabolism, no residual hypermetabolic adenopathy and decreased hypermetabolism associatedwith osseous metastases. 2. 6 mm posterior left upper lobe nodule, likely stable. Recommend attention on follow-up. 3. Aortic atherosclerosis (ICD10-I70.0). Coronary artery calcification.   12/17/2021 Imaging   MRI lumbar spine w wo contrast  1. No evidence of regional metastatic disease. 2. Late subacute superior endplate fracture at L2 and large superior endplate Schmorl's node at L5, unchanged since the PET scan of 10/29/2021. 3. L3-4: Shallow disc protrusion. Facet and ligamentous hypertrophy. Stenosis of both lateral recesses. Findings slightly worsened since 2022. 4. L4-5: Shallow disc protrusion. Facet and ligamentous hypertrophy. Small synovial cyst arising from the facet joint on the right. Stenosis of the lateral recesses right worse than left. Foraminal narrowing right worse than left. Findings have worsened since 2022.  5. L5-S1: Endplate osteophytes and shallow protrusion of the disc. Facet and ligamentous hypertrophy. Stenosis of the subarticular lateral recesses and neural foramina, right worse than left. Similar appearance to the study of 2022. 6. Continued evidence of enteritis of at least 1 loop of small bowel. Distended  bladder present   12/18/2021 Imaging   Brian MRI w wo  1. Interval decrease in size of the left cerebellar and vermian hematomas. No definite evidence of underlying metastatic lesion. 2.  There is are two possible contrast enhancing lesions in the posterior left frontal lobe and left occipital lobe, which do not have intrinsic T1 signal abnormality, but have a somewhat linear appearance and may be vascular in nature. Recommend attention on follow up. 3. Multifocal sites of susceptibility artifact are redemonstrated with interval development of a few new foci, as described above. All of these sites demonstrate intrinsic T1 hyperintense signal abnormality and susceptibility artifact and are compatible with sites of microhemorrhages. Recommend continued attention on follow up. 4. Interval decrease in size of right parietal calvarial metastatic lesion. No new contrast enhancing lesions visualized. 5. Diffusely heterogeneous marrow signal throughout the cervical spine, which is nonspecific but can be seen in the setting of anemia, smoking, obesity, or a marrow replacement process.     12/18/2021 Imaging   MRI lumbar spine w wo contrast  1. No evidence of regional metastatic disease. 2. Late subacute superior endplate fracture at L2 and large superior endplate Schmorl's node at L5, unchanged since the PET scan of 10/29/2021 3. L3-4: Shallow disc protrusion. Facet and ligamentous hypertrophy.Stenosis of both lateral recesses. Findings slightly worsened since 2022. 4. L4-5: Shallow disc protrusion. Facet and ligamentous hypertrophy. Small synovial cyst arising from the facet joint on the right. Stenosis of the lateral recesses right worse than left. Foraminal narrowing right worse than left. Findings have worsened since 2022. 5. L5-S1: Endplate osteophytes and shallow protrusion of the disc. Facet and ligamentous hypertrophy. Stenosis of the subarticular lateral recesses and neural foramina, right worse than left. Similar appearance to the study of 2022. 6. Continued evidence of enteritis of at least 1 loop of small bowel. Distended bladder present.       01/28/2022 Imaging   CT chest  abdomen pelvis w contrast 1. Treated left upper lobe mass appears grossly stable to the prior examination when measured in a similar fashion on the prior study. No definite signs of extra skeletal metastatic disease noted in the chest, abdomen or pelvis. 2. Widespread skeletal metastases redemonstrated, as above. 3. Hepatic steatosis. 4. Aortic atherosclerosis, in addition to left main and three-vessel coronary artery disease. Assessment for potential risk factor modification, dietary therapy or pharmacologic therapy may be warranted, if clinically indicated. 5. Additional incidental findings,    04/08/2022 Imaging   MRI lumbar spine w wo contrast  1. Stable remote superior endplate fracture of L2 and large Schmorl to noted involving L5. No worrisome bone lesions to suggest metastatic disease. 2. Degenerative lumbar spondylosis with multilevel disc disease and facet disease which appears relatively stable as detailed above. 3. Enlarging right-sided synovial cyst at L4-5 with progressive mass effect on the right side of the thecal sac. This could potentially be symptomatic   04/28/2022 Imaging   CT chest abdomen pelvis wo contrast  1. Similar versus slightly decreased size of treated left upper lobe primary bronchogenic carcinoma. 2. Similar osseous metastasis. 3. No evidence of extraosseous metastatic disease. 4. New small right pleural effusion. 5. Coronary artery atherosclerosis. Aortic Atherosclerosis   07/03/2022 Imaging   PET scan showed 1. Interval development of a small focus of intense hypermetabolism within the left upper lobe scar. This is associated with a 5 mm perifissural nodule in the left lung which is hypermetabolic. Hypermetabolism at both of these locations is new since prior PET-CT  and highly suspicious for recurrent disease. 2. No evidence for hypermetabolic soft tissue metastatic disease in the neck, abdomen, or pelvis. 3. Similar appearance of sclerotic and lytic bone  lesions without hypermetabolism on PET imaging. 4. Focal hypermetabolism associated with a fracture of the right L4 transverse process. The fracture was visible on the previous CT of 04/28/2022. No associated soft tissue lesion to suggest that this is pathologic in nature. Tracer accumulation on today's study is in keeping with healing fracture. There is also some minimal uptake in a prominent spur associated with the L4 superior endplate, most likely degenerative. 5.  Aortic Atherosclerosis    08/01/2022 - 08/14/2022 Radiation Therapy   Radiation to left lung.    10/01/2022 Imaging   chest angiogram PE protocol  No pulmonary embolism identified.   Stable nodular opacity in the left upper lobe. Please correlate for history of treated lung cancer. There is an adjacent small nodule along the course of the interlobar fissure which is slightly larger today compared to the study of February 2024. please correlate with findings of the recent PET-CT of 07/03/2022.      10/02/2022 Imaging   MRI brain with and without contrast 1. No acute intracranial abnormality. No evidence for intraparenchymal metastatic disease. 2. Chronic left cerebellar hemorrhage with multiple additional chronic micro hemorrhages elsewhere throughout the brain, stable. 3. Underlying mild chronic microvascular ischemic disease.    10/02/2022 Imaging   MRI lumbar spine showed 1. No MRI evidence for acute infection or other abnormality within the lumbar spine. 2. Few subcentimeter T1 hypointense lesions about the visualized posterior iliac wings. These correspond with small sclerotic foci seen on most recent CT from 04/08/2022, presumably reflecting patient's known osseous metastatic disease. These were not hypermetabolic on prior PET-CT from 57/84/6962. 3. No other evidence for metastatic disease within the lumbar spine itself. 4. Chronic L2 compression fracture, stable. Large Schmorl's node deformity with associated height  loss at L5, also stable. 5. Multifactorial degenerative changes at L3-4 through L5-S1 with resultant mild to moderate bilateral subarticular stenosis, with mild to moderate bilateral L3 through L5 foraminal narrowing as above.   12/24/2022 Imaging   CT chest w contrast   1. Stable appearance of treated lung lesion within the left upper lobe with surrounding scarring and architectural distortion. 2. The previous FDG avid subpleural nodule abutting the oblique fissure of the left lung (adjacent to post treatment changes) is again noted measuring 7 mm. This is stable from 10/01/2022. On the PET-CT from 07/03/2022 this nodule was measured at 5 mm. 3. New bandlike area of subpleural consolidation within the lateral and posterior left lower lobe is identified. This is favored to represent post treatment change. 4. Stable sclerotic lesions involving the T12 and L1 vertebra. 5. Coronary artery calcifications. 6.  Aortic Atherosclerosis    # Patient has a history of melanoma on his neck, treated in 2009. # History of hemorrhagic infarct left cerebellum  10/01/2022 - 10/06/2022 patient was admitted to the hospital due to lower extremity weakness and intermittent aphasia. Patient reports acute on chronic left lower extremity weakness, weakness has been more prominent over the past 2 weeks.  He reports significant episode of difficulty with ambulation 2 days after his last Keytruda treatments.  He reports that he felt loss of control of muscle at that time.  Patient reports that the weakness is different than her chronic lower extremity  weakness.  Patient was found to have orthostatic hypotension Flomax irbesartan were held.  CK wnl, Neurology  was consulted and started the patient on Keppra for possible seizure.  EEG was negative.  01/31/23 He recently presented to ER due to altered mental status and shaking episodes.  CT head wo negative. UA positive for nitrate and leukocyte esterase. Code sepsis was  actived in ER and he was recommended for treatment of UTI.  He declined admission and went home with Keflex 500mg  4 times daily for 10 days.  INTERVAL HISTORY Micheal Donovan is a 72 y.o. male who has above history reviewed by me today presents for follow up visit for management of  Stage IV lung adenocarcinoma Accompanied by care give.   + back pain, and left lower extremity weakness due to pain, on fentanyl patch Q72 hours and percocet Lower extremity weakness localizes best to lumbosacral nerve roots, known spondylosis with multiple foci of neuro-foraminal stenosis.  02/09/2023 S/p decompression neurosurgery  02/14/2023 ED visit due to  lethargy and hypotension, BP improved at the time of initial evaluation in ED. He was given hydration and empiric rocephin. Switched to Cefdinir.  Urine culture showed multi species.  Today he feels well.     Review of Systems  Constitutional:  Positive for appetite change, fatigue and unexpected weight change. Negative for chills and fever.  HENT:   Negative for hearing loss and voice change.   Eyes:  Negative for eye problems and icterus.  Respiratory:  Positive for shortness of breath. Negative for chest tightness and cough.   Cardiovascular:  Positive for leg swelling. Negative for chest pain.  Gastrointestinal:  Negative for abdominal distention, abdominal pain and blood in stool.  Endocrine: Negative for hot flashes.  Genitourinary:  Negative for difficulty urinating, dysuria and frequency.   Musculoskeletal:  Positive for back pain. Negative for arthralgias.  Skin:  Negative for itching and rash.  Neurological:  Negative for extremity weakness, headaches, light-headedness and numbness.  Hematological:  Negative for adenopathy. Does not bruise/bleed easily.  Psychiatric/Behavioral:  Positive for sleep disturbance. Negative for confusion.     MEDICAL HISTORY:  Past Medical History:  Diagnosis Date   Allergy    Anxiety    BPH (benign  prostatic hypertrophy)    Cancer (HCC)    Melanoma on Neck    2008   Carotid artery occlusion    CHF (congestive heart failure) (HCC)    Diabetes mellitus    type 2   ED (erectile dysfunction)    GERD (gastroesophageal reflux disease)    Hemorrhagic stroke (HCC) 06/2021   Hyperlipidemia    Hypertension    Lung cancer (HCC)    Neoplasm related pain    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)    Stroke [facility])     SURGICAL HISTORY: Past Surgical History:  Procedure Laterality Date   ANTERIOR CERVICAL DECOMP/DISCECTOMY FUSION N/A 02/09/2023   Procedure: C3-5 ANTERIOR CERVICAL DISCECTOMY AND FUSION;  Surgeon: Venetia Night, MD;  Location: [facility] ORS;  Service: Neurosurgery;  Laterality: N/A;   BRONCHIAL NEEDLE ASPIRATION BIOPSY  06/17/2021   Procedure: BRONCHIAL NEEDLE ASPIRATION BIOPSIES;  Surgeon: Lorin Glass, MD;  Location: [facility] ENDOSCOPY;  Service: Pulmonary;;   IR IMAGING GUIDED PORT INSERTION  08/05/2021   MELANOMA EXCISION  2008   Left side of neck   RADIOLOGY WITH ANESTHESIA N/A 04/05/2020   Procedure: MRI SPINE WITOUT CONTRAST;  Surgeon: Radiologist, Medication, MD;  Location: [facility] OR;  Service: Radiology;  Laterality: N/A;   RADIOLOGY WITH ANESTHESIA N/A 08/22/2021   Procedure: MRI BRAIN WITH AND WITHOUT CONTRAST  WITH ANESTHESIA;  Surgeon: Radiologist, Medication, MD;  Location: [facility] OR;  Service: Radiology;  Laterality: N/A;   RADIOLOGY WITH ANESTHESIA N/A 12/17/2021   Procedure: MRI BRAIN WITH AND WITHOUT CONTRAST WITH ANESTHESIA; MRI LUMBER WITH AND WITHOUT CONTRAST;  Surgeon: Radiologist, Medication, MD;  Location: [facility] OR;  Service: Radiology;  Laterality: N/A;   RADIOLOGY WITH ANESTHESIA N/A 04/08/2022   Procedure: MRI LUMBER SPINE WITH AND WITHOUT CONTRAST;  Surgeon: Radiologist, Medication, MD;  Location: [facility] OR;  Service: Radiology;  Laterality: N/A;   TONSILLECTOMY     VIDEO BRONCHOSCOPY WITH ENDOBRONCHIAL ULTRASOUND N/A 06/17/2021   Procedure: VIDEO BRONCHOSCOPY WITH  ENDOBRONCHIAL ULTRASOUND;  Surgeon: Lorin Glass, MD;  Location: [facility] ENDOSCOPY;  Service: Pulmonary;  Laterality: N/A;    SOCIAL HISTORY: Social History   Socioeconomic History   Marital status: Married    Spouse name: Not on file   Number of children: Not on file   Years of education: Not on file   Highest education level: Not on file  Occupational History   Not on file  Tobacco Use   Smoking status: Former    Current packs/day: 0.00    Types: Cigarettes    Quit date: 07/21/1990    Years since quitting: 32.6   Smokeless tobacco: Never  Vaping Use   Vaping status: Never Used  Substance and Sexual Activity   Alcohol use: No   Drug use: No   Sexual activity: Not Currently  Other Topics Concern   Not on file  Social History Narrative   Not on file   Social Drivers of Health   Financial Resource Strain: Low Risk  (04/17/2022)   Overall Financial Resource Strain (CARDIA)    Difficulty of Paying Living Expenses: Not hard at all  Food Insecurity: No Food Insecurity (02/09/2023)   Hunger Vital Sign    Worried About Running Out of Food in the Last Year: Never true    Ran Out of Food in the Last Year: Never true  Transportation Needs: No Transportation Needs (02/09/2023)   PRAPARE - Administrator, Civil Service (Medical): No    Lack of Transportation (Non-Medical): No  Physical Activity: Inactive (03/27/2022)   Exercise Vital Sign    Days of Exercise per Week: 0 days    Minutes of Exercise per Session: 0 min  Stress: Stress Concern Present (03/27/2022)   [employer] of Occupational Health - Occupational Stress Questionnaire    Feeling of Stress : To some extent  Social Connections: Unknown (11/17/2022)   Received from [facility]   Social Network    Social Network: Not on file  Intimate Partner Violence: Not At Risk (02/09/2023)   Humiliation, Afraid, Rape, and Kick questionnaire    Fear of Current or Ex-Partner: No    Emotionally Abused: No     Physically Abused: No    Sexually Abused: No    FAMILY HISTORY: Family History  Problem Relation Age of Onset   COPD Mother    Heart disease Father    Heart disease Brother        MI at age 3   Stroke Neg Hx     ALLERGIES:  is allergic to niaspan [niacin].  MEDICATIONS:  Current Outpatient Medications  Medication Sig Dispense Refill   ALPRAZolam (XANAX) 0.5 MG tablet Take 1 tablet (0.5 mg total) by mouth at bedtime as needed for anxiety. 15 tablet 0   cefdinir (OMNICEF) 300 MG capsule Take 1 capsule (300 mg total) by mouth  2 (two) times daily for 7 days. 14 capsule 0   celecoxib (CELEBREX) 200 MG capsule Take 1 capsule (200 mg total) by mouth 2 (two) times daily. 60 capsule 0   citalopram (CELEXA) 10 MG tablet Take 1 tablet (10 mg total) by mouth daily. 30 tablet 3   docusate sodium (COLACE) 100 MG capsule Take 100 mg by mouth 2 (two) times daily as needed for mild constipation.     fentaNYL (DURAGESIC) 75 MCG/HR Place 1 patch onto the skin every 3 (three) days. 10 patch 0   finasteride (PROSCAR) 5 MG tablet Take 1 tablet (5 mg total) by mouth daily. 30 tablet 0   gabapentin (NEURONTIN) 300 MG capsule Take 2 capsules (600 mg total) by mouth 2 (two) times daily. 120 capsule 2   insulin glargine (LANTUS) 100 UNIT/ML injection Inject 0-30 Units into the skin daily as needed (High Blood Sugar over 150).     levETIRAcetam (KEPPRA) 500 MG tablet Take 1 tablet (500 mg total) by mouth 2 (two) times daily. 60 tablet 5   megestrol (MEGACE) 40 MG tablet Take 1 tablet (40 mg total) by mouth daily. 30 tablet 3   methocarbamol (ROBAXIN) 500 MG tablet Take 1 tablet (500 mg total) by mouth every 6 (six) hours as needed for muscle spasms. 120 tablet 0   NOVOLOG FLEXPEN 100 UNIT/ML FlexPen Inject 0-10 Units into the skin daily as needed for high blood sugar (BG > 200).     Nystatin (GERHARDT'S BUTT CREAM) CREA Apply 14 Applications topically 2 (two) times daily. 1 each 0   omeprazole (PRILOSEC) 20 MG  capsule Take 20 mg by mouth daily.     oxyCODONE (OXY IR/ROXICODONE) 5 MG immediate release tablet Take 1-2 tablets (5-10 mg total) by mouth every 4 (four) hours as needed for moderate pain (pain score 4-6) or severe pain (pain score 7-10). 30 tablet 0   potassium chloride SA (KLOR-CON M) 20 MEQ tablet Take 1 tablet (20 mEq total) by mouth daily. 3 tablet 0   senna (SENOKOT) 8.6 MG TABS tablet Take 1 tablet (8.6 mg total) by mouth 2 (two) times daily as needed for mild constipation. 30 tablet 0   valsartan (DIOVAN) 80 MG tablet Take 80 mg by mouth daily.     No current facility-administered medications for this visit.   Facility-Administered Medications Ordered in Other Visits  Medication Dose Route Frequency Provider Last Rate Last Admin   heparin lock flush 100 UNIT/ML injection              PHYSICAL EXAMINATION:  Vitals:   02/17/23 1316  BP: 118/73  Pulse: 91  Resp: 18  Temp: (!) 96.8 F (36 C)   Filed Weights   02/17/23 1316  Weight: 130 lb (59 kg)    Physical Exam Constitutional:      General: He is not in acute distress.    Appearance: He is ill-appearing.  HENT:     Head: Normocephalic and atraumatic.  Eyes:     General: No scleral icterus. Cardiovascular:     Rate and Rhythm: Normal rate and regular rhythm.     Heart sounds: Normal heart sounds.  Pulmonary:     Effort: Pulmonary effort is normal. No respiratory distress.     Breath sounds: No wheezing.     Comments: Decreased breath sound bilaterally. Abdominal:     General: Bowel sounds are normal. There is no distension.     Palpations: Abdomen is soft.  Musculoskeletal:  General: No deformity. Normal range of motion.     Cervical back: Normal range of motion and neck supple.     Comments: Bilateral ankle trace edema.  Skin:    General: Skin is warm and dry.     Findings: No rash.  Neurological:     Mental Status: He is alert. Mental status is at baseline.     Cranial Nerves: No cranial nerve  deficit.     Comments: Chronic left-sided weakness  Psychiatric:        Mood and Affect: Mood normal.     LABORATORY DATA:  I have reviewed the data as listed    Latest Ref Rng & Units 02/17/2023    1:01 PM 02/13/2023    7:37 PM 02/02/2023   12:58 PM  CBC  WBC 4.0 - 10.5 K/uL 8.4  7.4  7.8   Hemoglobin 13.0 - 17.0 g/dL 98.1  19.1  47.8   Hematocrit 39.0 - 52.0 % 34.5  38.5  34.0   Platelets 150 - 400 K/uL 359  411  254       Latest Ref Rng & Units 02/17/2023    1:01 PM 02/13/2023    8:47 PM 02/13/2023    7:37 PM  CMP  Glucose 70 - 99 mg/dL 295   621   BUN 8 - 23 mg/dL 23  25  QUANTITY NOT SUFFICIENT, UNABLE TO PERFORM TEST   Creatinine 0.61 - 1.24 mg/dL 3.08  6.57  QUANTITY NOT SUFFICIENT, UNABLE TO PERFORM TEST   Sodium 135 - 145 mmol/L 136   130   Potassium 3.5 - 5.1 mmol/L 4.7   4.4   Chloride 98 - 111 mmol/L 105   100   CO2 22 - 32 mmol/L 23   17   Calcium 8.9 - 10.3 mg/dL 9.1   8.6   Total Protein 6.5 - 8.1 g/dL 7.1   7.3   Total Bilirubin <1.2 mg/dL 0.5   1.0   Alkaline Phos 38 - 126 U/L 60   65   AST 15 - 41 U/L 13   16   ALT 0 - 44 U/L 11   8      Iron/TIBC/Ferritin/ %Sat    Component Value Date/Time   IRON 60 02/03/2022 0958   TIBC 391 02/03/2022 0958   FERRITIN 704 (H) 02/03/2022 0958   IRONPCTSAT 15 (L) 02/03/2022 8469      RADIOGRAPHIC STUDIES: I have personally reviewed the radiological images as listed and agreed with the findings in the report. DG Chest 2 View Result Date: 02/13/2023 CLINICAL DATA:  Suspected sepsis EXAM: CHEST - 2 VIEW COMPARISON:  01/31/2023, CT chest 12/19/2022 FINDINGS: Right-sided central venous port tip at the cavoatrial region. 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 No acute airspace disease. Stable cardiomediastinal silhouette. No pneumothorax. IMPRESSION: No active cardiopulmonary disease. 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  Electronically Signed   By: Jasmine Pang M.D.   On: 02/13/2023 22:04   DG Cervical Spine 2-3 Views Result Date: 02/09/2023 CLINICAL DATA:  C3-5 ACDF. EXAM: CERVICAL SPINE - 2-3 VIEW COMPARISON:  None Available. FINDINGS: Three C-arm images of the cervical spine demonstrate an initial localizer at the C3-4 level follow-up interbody and anterior screw and plate fusion at the C3-5 levels. Normal alignment. IMPRESSION: C3-5 fusion. Electronically Signed   By: Beckie Salts M.D.   On: 02/09/2023 15:01   DG C-Arm 1-60  Min-No Report Result Date: 02/09/2023 Fluoroscopy was utilized by the requesting physician.  No radiographic interpretation.   DG C-Arm 1-60 Min-No Report Result Date: 02/09/2023 Fluoroscopy was utilized by the requesting physician.  No radiographic interpretation.   CT Head Wo Contrast Result Date: 01/31/2023 CLINICAL DATA:  Tremors and altered mental status. EXAM: CT HEAD WITHOUT CONTRAST TECHNIQUE: Contiguous axial images were obtained from the base of the skull through the vertex without intravenous contrast. RADIATION DOSE REDUCTION: This exam was performed according to the departmental dose-optimization program which includes automated exposure control, adjustment of the mA and/or kV according to patient size and/or use of iterative reconstruction technique. COMPARISON:  None Available. FINDINGS: Brain: There is mild cerebral atrophy with widening of the extra-axial spaces and ventricular dilatation. There are areas of decreased attenuation within the white matter tracts of the supratentorial brain, consistent with microvascular disease changes. A stable, chronic 12 mm x 6 mm x 14 mm area of calcification and adjacent encephalomalacia is seen within the cerebellum on the left. Vascular: Mild to moderate severity calcification of the bilateral cavernous carotid arteries is seen. Skull: Normal. Negative for fracture or focal lesion. Sinuses/Orbits: There is mild sphenoid sinus mucosal  thickening. Other: A small, stable left frontal scalp lipoma is noted. IMPRESSION: 1. Generalized cerebral atrophy with chronic white matter small vessel ischemic changes. 2. No acute intracranial abnormality. 3. Mild sphenoid sinus disease. Electronically Signed   By: Aram Candela M.D.   On: 01/31/2023 01:46   DG Chest Port 1 View Result Date: 01/31/2023 CLINICAL DATA:  Question of sepsis to evaluate for abnormality. Altered mental status with tremors. Left-sided weakness. Fever and tachycardia. EXAM: PORTABLE CHEST 1 VIEW COMPARISON:  02/13/2022 FINDINGS: Power port type central venous catheter with tip over the cavoatrial junction region. No pneumothorax. Linear scarring in the left mid lung corresponding to scarring and nodularity on prior CT 12/19/2022. No significant change. Right lung is clear. No pleural effusions. No pneumothorax. Mediastinal contours appear intact. Calcification of the aorta. Degenerative changes in the spine and shoulders. IMPRESSION: 1. Linear scarring corresponding to known left lung mass and treatment changes. 2. No acute consolidation. Electronically Signed   By: Burman Nieves M.D.   On: 01/31/2023 00:50

## 2023-02-17 NOTE — Assessment & Plan Note (Signed)
Recommend Zometa every 4- 6 weeks.-  hold off today due to possible upcoming surgery Continue calcium supplementation.  MRI lumbar with and without contrast showed  Stable remote superior endplate fracture of L2 and large Schmorl to noted involving L5. 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 PET scan shows stable bone lesions.  L4 fracture uptake likely benign- s/p nerve block - pending neurosurgery evaluation.  Hold off Zometa given possibility of recent back surgery.

## 2023-02-17 NOTE — Assessment & Plan Note (Signed)
Acute on chronic. Currently on Keppra for presumed seizure. Follow-up with neurology.  S/p decompression for cervical stenosis

## 2023-02-18 LAB — CULTURE, BLOOD (ROUTINE X 2)
Culture: NO GROWTH
Culture: NO GROWTH
Special Requests: ADEQUATE
Special Requests: ADEQUATE

## 2023-02-19 ENCOUNTER — Encounter: Payer: Self-pay | Admitting: Neurosurgery

## 2023-02-19 ENCOUNTER — Ambulatory Visit: Payer: PPO | Admitting: Neurosurgery

## 2023-02-19 VITALS — BP 120/78 | Ht 67.0 in | Wt 130.0 lb

## 2023-02-19 DIAGNOSIS — G959 Disease of spinal cord, unspecified: Secondary | ICD-10-CM

## 2023-02-19 DIAGNOSIS — Z981 Arthrodesis status: Secondary | ICD-10-CM

## 2023-02-19 NOTE — Progress Notes (Signed)
   REFERRING PHYSICIAN:  Donita Brooks, Md [address] [place],  [place] [address]  DOS: 02/09/23 C3-5 ACDF  HISTORY OF PRESENT ILLNESS: Micheal Donovan is 10 days status post ACDF for cervical stenosis and myelopathy. Overall, he is doing better.  He reports some improvement.  He still has some trouble with lifting his arms above his head particularly in the right and lifting objects up with an extended arm like to place a cup down on the counter.  He denies any incisional concerns and feels he is swallowing well.  He is still working with home health therapy and taking his medications as directed.  He has not yet been able to ambulate.  PHYSICAL EXAMINATION:  NEUROLOGICAL:  General: In no acute distress.   Awake, alert, oriented to person, place, and time.  Pupils equal round and reactive to light.  Facial tone is symmetric.  Tongue protrusion is midline.  There is no pronator drift.  Strength: Side Biceps Triceps Deltoid Interossei Grip Wrist Ext. Wrist Flex.  R 5 5 4 5 5 5 5   L 5 5 5 5 5 5 5    Incision c/d/I and healing well  Imaging:  No interval imaging to review today  Assessment / Plan: MARKEEM WENDLER is doing well after recent ACDF.  We discussed his medications at length and the risk of respiratory depression given multiple medications that increase his risk of this.  He is taking them as directed and tolerating things okay.  We discussed activity escalation and I have advised the patient to lift up to 10 pounds until 6 weeks after surgery, then increase up to 25 pounds until 12 weeks after surgery.  After 12 weeks post-op, the patient advised to increase activity as tolerated. he will return to clinic in approximately 4 weeks to see Dr. Myer Haff with cervical x-rays prior. Advised to contact the office if any questions or concerns arise.   Manning Charity PA-C Dept of Neurosurgery

## 2023-02-23 ENCOUNTER — Ambulatory Visit: Admit: 2023-02-23 | Payer: PPO

## 2023-02-23 ENCOUNTER — Ambulatory Visit: Payer: PPO

## 2023-02-23 ENCOUNTER — Ambulatory Visit: Payer: PPO | Admitting: Oncology

## 2023-02-23 ENCOUNTER — Other Ambulatory Visit: Payer: PPO

## 2023-02-24 ENCOUNTER — Ambulatory Visit
Admission: RE | Admit: 2023-02-24 | Discharge: 2023-02-24 | Disposition: A | Discharge status: Home / Self Care | Payer: PPO | Source: Ambulatory Visit | Attending: Oncology | Admitting: Oncology

## 2023-02-24 DIAGNOSIS — G9389 Other specified disorders of brain: Secondary | ICD-10-CM | POA: Diagnosis not present

## 2023-02-24 DIAGNOSIS — C3412 Malignant neoplasm of upper lobe, left bronchus or lung: Secondary | ICD-10-CM | POA: Insufficient documentation

## 2023-02-24 DIAGNOSIS — I6782 Cerebral ischemia: Secondary | ICD-10-CM | POA: Diagnosis not present

## 2023-02-24 DIAGNOSIS — I619 Nontraumatic intracerebral hemorrhage, unspecified: Secondary | ICD-10-CM | POA: Insufficient documentation

## 2023-02-24 DIAGNOSIS — C7951 Secondary malignant neoplasm of bone: Secondary | ICD-10-CM | POA: Diagnosis not present

## 2023-02-24 DIAGNOSIS — C3490 Malignant neoplasm of unspecified part of unspecified bronchus or lung: Secondary | ICD-10-CM | POA: Diagnosis not present

## 2023-02-24 DIAGNOSIS — I3139 Other pericardial effusion (noninflammatory): Secondary | ICD-10-CM | POA: Diagnosis not present

## 2023-02-24 MED ORDER — GADOBUTROL 1 MMOL/ML IV SOLN
6.0000 mL | Freq: Once | INTRAVENOUS | Status: AC | PRN
  Administered 2023-02-24: 6 mL via INTRAVENOUS

## 2023-02-25 NOTE — Progress Notes (Signed)
Patient presented with low blood pressure.  INR ordered as part of evaluation for etiology of shock.

## 2023-03-03 ENCOUNTER — Encounter: Payer: Self-pay | Admitting: Hospice and Palliative Medicine

## 2023-03-03 ENCOUNTER — Other Ambulatory Visit: Payer: Self-pay | Admitting: Hospice and Palliative Medicine

## 2023-03-03 ENCOUNTER — Other Ambulatory Visit: Payer: Self-pay | Admitting: *Deleted

## 2023-03-03 DIAGNOSIS — G893 Neoplasm related pain (acute) (chronic): Secondary | ICD-10-CM

## 2023-03-03 DIAGNOSIS — C3412 Malignant neoplasm of upper lobe, left bronchus or lung: Secondary | ICD-10-CM

## 2023-03-03 MED ORDER — ALPRAZOLAM 0.5 MG PO TABS: 0.5000 mg | ORAL_TABLET | Freq: Every evening | ORAL | 0 refills | Status: DC | PRN

## 2023-03-03 MED ORDER — OXYCODONE-ACETAMINOPHEN 7.5-325 MG PO TABS: 1.0000 | ORAL_TABLET | ORAL | 0 refills | Status: DC | PRN

## 2023-03-03 NOTE — Telephone Encounter (Signed)
 Script pended for [employer]

## 2023-03-10 ENCOUNTER — Encounter: Payer: Self-pay | Admitting: Oncology

## 2023-03-10 ENCOUNTER — Inpatient Hospital Stay (HOSPITAL_BASED_OUTPATIENT_CLINIC_OR_DEPARTMENT_OTHER): Payer: PPO | Admitting: Oncology

## 2023-03-10 ENCOUNTER — Inpatient Hospital Stay: Payer: PPO

## 2023-03-10 ENCOUNTER — Inpatient Hospital Stay: Payer: PPO | Attending: Radiation Oncology

## 2023-03-10 VITALS — BP 138/72 | HR 83

## 2023-03-10 VITALS — BP 110/66 | HR 84 | Temp 98.2°F | Wt 132.0 lb

## 2023-03-10 DIAGNOSIS — I110 Hypertensive heart disease with heart failure: Secondary | ICD-10-CM | POA: Diagnosis not present

## 2023-03-10 DIAGNOSIS — Z87440 Personal history of urinary (tract) infections: Secondary | ICD-10-CM | POA: Insufficient documentation

## 2023-03-10 DIAGNOSIS — Z85820 Personal history of malignant melanoma of skin: Secondary | ICD-10-CM | POA: Diagnosis not present

## 2023-03-10 DIAGNOSIS — Z923 Personal history of irradiation: Secondary | ICD-10-CM | POA: Insufficient documentation

## 2023-03-10 DIAGNOSIS — C3412 Malignant neoplasm of upper lobe, left bronchus or lung: Secondary | ICD-10-CM

## 2023-03-10 DIAGNOSIS — Z8673 Personal history of transient ischemic attack (TIA), and cerebral infarction without residual deficits: Secondary | ICD-10-CM | POA: Insufficient documentation

## 2023-03-10 DIAGNOSIS — M549 Dorsalgia, unspecified: Secondary | ICD-10-CM | POA: Insufficient documentation

## 2023-03-10 DIAGNOSIS — K219 Gastro-esophageal reflux disease without esophagitis: Secondary | ICD-10-CM | POA: Diagnosis not present

## 2023-03-10 DIAGNOSIS — C7951 Secondary malignant neoplasm of bone: Secondary | ICD-10-CM | POA: Insufficient documentation

## 2023-03-10 DIAGNOSIS — I700 Atherosclerosis of aorta: Secondary | ICD-10-CM | POA: Diagnosis not present

## 2023-03-10 DIAGNOSIS — Z791 Long term (current) use of non-steroidal anti-inflammatories (NSAID): Secondary | ICD-10-CM | POA: Insufficient documentation

## 2023-03-10 DIAGNOSIS — M47817 Spondylosis without myelopathy or radiculopathy, lumbosacral region: Secondary | ICD-10-CM | POA: Insufficient documentation

## 2023-03-10 DIAGNOSIS — C7931 Secondary malignant neoplasm of brain: Secondary | ICD-10-CM | POA: Diagnosis not present

## 2023-03-10 DIAGNOSIS — N400 Enlarged prostate without lower urinary tract symptoms: Secondary | ICD-10-CM | POA: Diagnosis not present

## 2023-03-10 DIAGNOSIS — I619 Nontraumatic intracerebral hemorrhage, unspecified: Secondary | ICD-10-CM | POA: Diagnosis not present

## 2023-03-10 DIAGNOSIS — N390 Urinary tract infection, site not specified: Secondary | ICD-10-CM | POA: Insufficient documentation

## 2023-03-10 DIAGNOSIS — Z794 Long term (current) use of insulin: Secondary | ICD-10-CM | POA: Insufficient documentation

## 2023-03-10 DIAGNOSIS — E1140 Type 2 diabetes mellitus with diabetic neuropathy, unspecified: Secondary | ICD-10-CM | POA: Insufficient documentation

## 2023-03-10 DIAGNOSIS — I509 Heart failure, unspecified: Secondary | ICD-10-CM | POA: Insufficient documentation

## 2023-03-10 DIAGNOSIS — M8580 Other specified disorders of bone density and structure, unspecified site: Secondary | ICD-10-CM | POA: Insufficient documentation

## 2023-03-10 DIAGNOSIS — Z5112 Encounter for antineoplastic immunotherapy: Secondary | ICD-10-CM | POA: Insufficient documentation

## 2023-03-10 DIAGNOSIS — G629 Polyneuropathy, unspecified: Secondary | ICD-10-CM | POA: Diagnosis not present

## 2023-03-10 DIAGNOSIS — G893 Neoplasm related pain (acute) (chronic): Secondary | ICD-10-CM | POA: Diagnosis not present

## 2023-03-10 DIAGNOSIS — Z87891 Personal history of nicotine dependence: Secondary | ICD-10-CM | POA: Diagnosis not present

## 2023-03-10 DIAGNOSIS — E785 Hyperlipidemia, unspecified: Secondary | ICD-10-CM | POA: Diagnosis not present

## 2023-03-10 LAB — CBC WITH DIFFERENTIAL/PLATELET
Abs Immature Granulocytes: 0.03 10*3/uL (ref 0.00–0.07)
Basophils Absolute: 0 10*3/uL (ref 0.0–0.1)
Basophils Relative: 1 %
Eosinophils Absolute: 0.5 10*3/uL (ref 0.0–0.5)
Eosinophils Relative: 8 %
HCT: 34.9 % — ABNORMAL LOW (ref 39.0–52.0)
Hemoglobin: 11.3 g/dL — ABNORMAL LOW (ref 13.0–17.0)
Immature Granulocytes: 1 %
Lymphocytes Relative: 15 %
Lymphs Abs: 1 10*3/uL (ref 0.7–4.0)
MCH: 30.3 pg (ref 26.0–34.0)
MCHC: 32.4 g/dL (ref 30.0–36.0)
MCV: 93.6 fL (ref 80.0–100.0)
Monocytes Absolute: 0.5 10*3/uL (ref 0.1–1.0)
Monocytes Relative: 8 %
Neutro Abs: 4.4 10*3/uL (ref 1.7–7.7)
Neutrophils Relative %: 67 %
Platelets: 219 10*3/uL (ref 150–400)
RBC: 3.73 MIL/uL — ABNORMAL LOW (ref 4.22–5.81)
RDW: 13.6 % (ref 11.5–15.5)
WBC: 6.5 10*3/uL (ref 4.0–10.5)
nRBC: 0 % (ref 0.0–0.2)

## 2023-03-10 LAB — COMPREHENSIVE METABOLIC PANEL
ALT: 9 U/L (ref 0–44)
AST: 14 U/L — ABNORMAL LOW (ref 15–41)
Albumin: 3.7 g/dL (ref 3.5–5.0)
Alkaline Phosphatase: 65 U/L (ref 38–126)
Anion gap: 9 (ref 5–15)
BUN: 19 mg/dL (ref 8–23)
CO2: 26 mmol/L (ref 22–32)
Calcium: 8.9 mg/dL (ref 8.9–10.3)
Chloride: 102 mmol/L (ref 98–111)
Creatinine, Ser: 1.17 mg/dL (ref 0.61–1.24)
GFR, Estimated: >60 mL/min (ref >60)
Glucose, Bld: 151 mg/dL — ABNORMAL HIGH (ref 70–99)
Potassium: 3.8 mmol/L (ref 3.5–5.1)
Sodium: 137 mmol/L (ref 135–145)
Total Bilirubin: 0.4 mg/dL (ref 0.0–1.2)
Total Protein: 6.6 g/dL (ref 6.5–8.1)

## 2023-03-10 MED ORDER — SODIUM CHLORIDE 0.9 % IV SOLN
200.0000 mg | Freq: Once | INTRAVENOUS | Status: AC
  Administered 2023-03-10: 200 mg via INTRAVENOUS
  Filled 2023-03-10: qty 8

## 2023-03-10 MED ORDER — HEPARIN SOD (PORK) LOCK FLUSH 100 UNIT/ML IV SOLN
500.0000 [IU] | Freq: Once | INTRAVENOUS | Status: AC | PRN
  Administered 2023-03-10: 500 [IU]
  Filled 2023-03-10: qty 5

## 2023-03-10 MED ORDER — SODIUM CHLORIDE 0.9% FLUSH
10.0000 mL | INTRAVENOUS | Status: DC | PRN
  Administered 2023-03-10: 10 mL
  Filled 2023-03-10: qty 10

## 2023-03-10 MED ORDER — SODIUM CHLORIDE 0.9 % IV SOLN
Freq: Once | INTRAVENOUS | Status: AC
  Filled 2023-03-10: qty 250

## 2023-03-10 NOTE — Assessment & Plan Note (Signed)
 Hold off Eliquis per neurology Dr. Barbaraann Cao 10/02/22 Repeat MRI brain-stable findings Repeat MRI brain-result is pending.

## 2023-03-10 NOTE — Progress Notes (Signed)
 Hematology/Oncology Progress note [phone] [phone]       CHIEF COMPLAINTS/REASON FOR VISIT:  Metastatic non-small cell lung cancer  ASSESSMENT & PLAN:   Cancer Staging  Primary non-small cell carcinoma of upper lobe of left lung (HCC) Staging form: Lung, AJCC 8th Edition - Clinical: Stage IV (cT2b, cN1, cM1) - Signed by Babara Call, MD on 08/06/2021   Primary non-small cell carcinoma of upper lobe of left lung (HCC) Stage IV lung adenocarcinoma with brain and bone metastasis.  S/p lung radiation to left lung.  12/19/2022, CT chest stable disease.  Labs are reviewed and discussed with patient.  CT chest abdomen pelvis wo contrast results were reviewed with patient and wife.  There is increased opacity along the lingula with increasing nodular central component.  Left lower lobe opacity decreased.  No developing new other mass lesions or nodal enlargement.  Scattered sclerotic bone lesions. Increased opacity could be secondary to radiation changes.  Disease progression is also a possibility. Patient is currently asymptomatic.  He also is not candidate for aggressive treatments. I recommend to continue Keytruda  and repeat CT in 3 months for close monitoring.  Encounter for antineoplastic immunotherapy Immunotherapy plan as listed above  Intraparenchymal hemorrhage of brain (HCC) Hold off Eliquis  per neurology Dr. Buckley 10/02/22 Repeat MRI brain-stable findings Repeat MRI brain-result is pending.   Metastasis to bone (HCC) Recommend Zometa  every 4- 6 weeks.-  hold off today due to possible upcoming surgery Continue calcium  supplementation.  MRI lumbar with and without contrast showed  Stable remote superior endplate fracture of L2 and large Schmorl to noted involving L5. 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 PET scan shows stable bone lesions.  L4 fracture uptake likely benign- s/p  nerve block - pending neurosurgery evaluation.  Hold off Zometa  due to recent back decompression surgery [02/09/2023].  Neuropathy Fingertip neuropathy intermittent Grade 1, feet neuropathy persistent, Grade 2.  On Gabapentin  300mg  TID     UTI (urinary tract infection) Recurrent UTI, increased frequency since the utilization of condom catheter.  Recent urine culture is + multiple bacteria growth..  Refer to urology for evaluation.       Follow up 3 weeks All questions were answered. The patient knows to call the clinic with any problems, questions or concerns.  Call Babara, MD, [name] [facility] Hematology Oncology 03/10/2023      HISTORY OF PRESENTING ILLNESS:   Micheal Donovan is a  73 y.o.  male presents for follow up of Non-small cell lung cancer.  Oncology History Overview Note  Diagnosis: Stage IIB T2b N1 M0 adenocarcinoma of the LUL, poorly differentiated     Primary non-small cell carcinoma of upper lobe of left lung (HCC)  06/13/2021 Initial Diagnosis   Primary non-small cell carcinoma of upper lobe of left lung [facility]) -06/20/2021 - 06/27/2021, patient presented to [facility] due to progressive headache/dizziness/gait changes. CT head showed acute to subacute intraparenchymal hemorrhage involving the left cerebellum.  Surrounding low-density vasogenic edema.  Patient was transferred to [facility]. This was further evaluated by CT angiogram of the neck which showed bulky calcified plaques/stenosis of carotid artery, Followed by MRI brain. 06/12/2021, MRI of the brain showed no significant interval change in size of the left cerebellar intraparenchymal hematoma with unchanged regional mass effect and partial effacement of fourth ventricle but no upstream hydrocephalus. There is no discernible enhancement to suggest underlying mass lesion, though acute blood products could mask  enhancement.   06/12/2021 a chest x-ray showed a 4.4 cm left middle lobe. 06/13/2021, CT chest  with contrast showed a 4.3 x 3.6 x 3.3 cm lobular spiculated mass in the posterior left upper lobe with T3 to the lateral pleura and major fissure.  Metastatic left hilar lymphadenopathy.  Peripheral micronodularity posterior right costophrenic sulcus.  Aortic atherosclerosis. 06/14/2021, CT abdomen pelvis showed right lower lobe pulmonary artery embolus.  No evidence of right heart strain.  No acute intra-abdominal or pelvic pathology.  Aortic atherosclerosis.  06/13/2021, patient underwent bronchoscopy with EBUS by Dr. Claudene.  Biopsy from the fine-needle aspiration of station 11 mL showed malignant cells, consistent with poorly differentiated non-small cell carcinoma, consistent with adenocarcinoma.  Malignant cells are TTF-1 positive and negative for p40.  Negative for neuroendocrine markers.  Blood test and tissue sample were tested for Gardant 360  -PD-L1 TPS 97%, no actionable mutation on the blood testing. 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   07/17/2021 Cancer Staging   Staging form: Lung, AJCC 8th Edition - Clinical: Stage IV (cT2b, cN1, cM1) - Signed by Babara Call, MD on 08/06/2021   08/06/2021 Imaging   I saw patient's PET scan after his visit with me on 08/06/21. PET scan was ordered by his previous oncologist group and did not come to my in basket.. Patient received cycle 1 carboplatin  Taxol  today.  He has started on radiation.  5/26/3 PET scan showed 3.8 cm hypermetabolic left upper lobe mass with hypermetabolic left hilar and infrahilar adenopathy.  There is approximately 8 scattered metastatic lesions in the skeleton. Mixed density photopenic lesion in the left cerebellum. characterized as hemorrhage on recent prior imaging workups.    08/16/2021 -  Chemotherapy   Patient is on Treatment Plan : LUNG Carboplatin  (5) + Pemetrexed  + Pembrolizumab  (200) D1 q21d Induction x 4 cycles / Maintenance Pemetrexed  + Pembrolizumab  (200) D1 q21d      08/22/2021 Imaging   MRI brain w wo  contrast  Decrease in size of left cerebellar hematoma with resolution of edema. No evidence of underlying lesion. Smaller, more recent hemorrhage in the left cerebellar vermis with minimal edema. There is minimal enhancement without definite evidence of underlying lesion.  New punctate focus of chronic blood products and enhancement in the left frontal lobe. Additional new foci of chronic blood products in the posterior right putamen, right parietal subcortical white matter, and posterior right cerebellum. Unclear at this time if these represent foci of bland hemorrhage or early metastases.   Increase in size of right parietal osseous metastasis with minor extraosseous extension.     Imaging   PET scan showed 1. Interval response to therapy as evidenced by a small residual left upper lobe nodule with decreased hypermetabolism, no residual hypermetabolic adenopathy and decreased hypermetabolism associatedwith osseous metastases. 2. 6 mm posterior left upper lobe nodule, likely stable. Recommend attention on follow-up. 3. Aortic atherosclerosis (ICD10-I70.0). Coronary artery calcification.   12/17/2021 Imaging   MRI lumbar spine w wo contrast  1. No evidence of regional metastatic disease. 2. Late subacute superior endplate fracture at L2 and large superior endplate Schmorl's node at L5, unchanged since the PET scan of 10/29/2021. 3. L3-4: Shallow disc protrusion. Facet and ligamentous hypertrophy. Stenosis of both lateral recesses. Findings slightly worsened since 2022. 4. L4-5: Shallow disc protrusion. Facet and ligamentous hypertrophy. Small synovial cyst arising from the facet joint on the right. Stenosis of the lateral recesses right worse than left. Foraminal narrowing right worse than left. Findings  have worsened since 2022.  5. L5-S1: Endplate osteophytes and shallow protrusion of the disc. Facet and ligamentous hypertrophy. Stenosis of the subarticular lateral recesses and neural  foramina, right worse than left. Similar appearance to the study of 2022. 6. Continued evidence of enteritis of at least 1 loop of small bowel. Distended bladder present   12/18/2021 Imaging   Brian MRI w wo  1. Interval decrease in size of the left cerebellar and vermian hematomas. No definite evidence of underlying metastatic lesion. 2. There is are two possible contrast enhancing lesions in the posterior left frontal lobe and left occipital lobe, which do not have intrinsic T1 signal abnormality, but have a somewhat linear appearance and may be vascular in nature. Recommend attention on follow up. 3. Multifocal sites of susceptibility artifact are redemonstrated with interval development of a few new foci, as described above. All of these sites demonstrate intrinsic T1 hyperintense signal abnormality and susceptibility artifact and are compatible with sites of microhemorrhages. Recommend continued attention on follow up. 4. Interval decrease in size of right parietal calvarial metastatic lesion. No new contrast enhancing lesions visualized. 5. Diffusely heterogeneous marrow signal throughout the cervical spine, which is nonspecific but can be seen in the setting of anemia, smoking, obesity, or a marrow replacement process.     12/18/2021 Imaging   MRI lumbar spine w wo contrast  1. No evidence of regional metastatic disease. 2. Late subacute superior endplate fracture at L2 and large superior endplate Schmorl's node at L5, unchanged since the PET scan of 10/29/2021 3. L3-4: Shallow disc protrusion. Facet and ligamentous hypertrophy.Stenosis of both lateral recesses. Findings slightly worsened since 2022. 4. L4-5: Shallow disc protrusion. Facet and ligamentous hypertrophy. Small synovial cyst arising from the facet joint on the right. Stenosis of the lateral recesses right worse than left. Foraminal narrowing right worse than left. Findings have worsened since 2022. 5. L5-S1: Endplate  osteophytes and shallow protrusion of the disc. Facet and ligamentous hypertrophy. Stenosis of the subarticular lateral recesses and neural foramina, right worse than left. Similar appearance to the study of 2022. 6. Continued evidence of enteritis of at least 1 loop of small bowel. Distended bladder present.       01/28/2022 Imaging   CT chest abdomen pelvis w contrast 1. Treated left upper lobe mass appears grossly stable to the prior examination when measured in a similar fashion on the prior study. No definite signs of extra skeletal metastatic disease noted in the chest, abdomen or pelvis. 2. Widespread skeletal metastases redemonstrated, as above. 3. Hepatic steatosis. 4. Aortic atherosclerosis, in addition to left main and three-vessel coronary artery disease. Assessment for potential risk factor modification, dietary therapy or pharmacologic therapy may be warranted, if clinically indicated. 5. Additional incidental findings,    04/08/2022 Imaging   MRI lumbar spine w wo contrast  1. Stable remote superior endplate fracture of L2 and large Schmorl to noted involving L5. No worrisome bone lesions to suggest metastatic disease. 2. Degenerative lumbar spondylosis with multilevel disc disease and facet disease which appears relatively stable as detailed above. 3. Enlarging right-sided synovial cyst at L4-5 with progressive mass effect on the right side of the thecal sac. This could potentially be symptomatic   04/28/2022 Imaging   CT chest abdomen pelvis wo contrast  1. Similar versus slightly decreased size of treated left upper lobe primary bronchogenic carcinoma. 2. Similar osseous metastasis. 3. No evidence of extraosseous metastatic disease. 4. New small right pleural effusion. 5. Coronary artery atherosclerosis. Aortic  Atherosclerosis   07/03/2022 Imaging   PET scan showed 1. Interval development of a small focus of intense hypermetabolism within the left upper lobe scar. This  is associated with a 5 mm perifissural nodule in the left lung which is hypermetabolic. Hypermetabolism at both of these locations is new since prior PET-CT and highly suspicious for recurrent disease. 2. No evidence for hypermetabolic soft tissue metastatic disease in the neck, abdomen, or pelvis. 3. Similar appearance of sclerotic and lytic bone lesions without hypermetabolism on PET imaging. 4. Focal hypermetabolism associated with a fracture of the right L4 transverse process. The fracture was visible on the previous CT of 04/28/2022. No associated soft tissue lesion to suggest that this is pathologic in nature. Tracer accumulation on today's study is in keeping with healing fracture. There is also some minimal uptake in a prominent spur associated with the L4 superior endplate, most likely degenerative. 5.  Aortic Atherosclerosis    08/01/2022 - 08/14/2022 Radiation Therapy   Radiation to left lung.    10/01/2022 Imaging   chest angiogram PE protocol  No pulmonary embolism identified.   Stable nodular opacity in the left upper lobe. Please correlate for history of treated lung cancer. There is an adjacent small nodule along the course of the interlobar fissure which is slightly larger today compared to the study of February 2024. please correlate with findings of the recent PET-CT of 07/03/2022.      10/02/2022 Imaging   MRI brain with and without contrast 1. No acute intracranial abnormality. No evidence for intraparenchymal metastatic disease. 2. Chronic left cerebellar hemorrhage with multiple additional chronic micro hemorrhages elsewhere throughout the brain, stable. 3. Underlying mild chronic microvascular ischemic disease.    10/02/2022 Imaging   MRI lumbar spine showed 1. No MRI evidence for acute infection or other abnormality within the lumbar spine. 2. Few subcentimeter T1 hypointense lesions about the visualized posterior iliac wings. These correspond with small sclerotic  foci seen on most recent CT from 04/08/2022, presumably reflecting patient's known osseous metastatic disease. These were not hypermetabolic on prior PET-CT from 94/97/7975. 3. No other evidence for metastatic disease within the lumbar spine itself. 4. Chronic L2 compression fracture, stable. Large Schmorl's node deformity with associated height loss at L5, also stable. 5. Multifactorial degenerative changes at L3-4 through L5-S1 with resultant mild to moderate bilateral subarticular stenosis, with mild to moderate bilateral L3 through L5 foraminal narrowing as above.   12/24/2022 Imaging   CT chest w contrast   1. Stable appearance of treated lung lesion within the left upper lobe with surrounding scarring and architectural distortion. 2. The previous FDG avid subpleural nodule abutting the oblique fissure of the left lung (adjacent to post treatment changes) is again noted measuring 7 mm. This is stable from 10/01/2022. On the PET-CT from 07/03/2022 this nodule was measured at 5 mm. 3. New bandlike area of subpleural consolidation within the lateral and posterior left lower lobe is identified. This is favored to represent post treatment change. 4. Stable sclerotic lesions involving the T12 and L1 vertebra. 5. Coronary artery calcifications. 6.  Aortic Atherosclerosis    03/06/2023 Imaging   CT chest abdomen pelvis wo contrast showed  Increasing opacity along the lingula with the increasing nodular central component compared to the recent CT scan.   There is also some changing opacities in the left lower lobe. The larger more peripheral area in the left lower lobe is decreasing with some new areas of subtle ground-glass more caudal. Attention on  follow-up.   No developing new other mass lesion or nodal enlargement.   Scattered sclerotic bone lesions has a similar distribution to previous.   Overall evaluation for solid organ pathology including solid organ metastases are  limited without the advantage of IV contrast.     # Patient has a history of melanoma on his neck, treated in 2009. # History of hemorrhagic infarct left cerebellum  10/01/2022 - 10/06/2022 patient was admitted to the hospital due to lower extremity weakness and intermittent aphasia. Patient reports acute on chronic left lower extremity weakness, weakness has been more prominent over the past 2 weeks.  He reports significant episode of difficulty with ambulation 2 days after his last Keytruda  treatments.  He reports that he felt loss of control of muscle at that time.  Patient reports that the weakness is different than her chronic lower extremity  weakness.  Patient was found to have orthostatic hypotension Flomax  irbesartan  were held.  CK wnl, Neurology was consulted and started the patient on Keppra  for possible seizure.  EEG was negative.  01/31/23 He recently presented to ER due to altered mental status and shaking episodes.  CT head wo negative. UA positive for nitrate and leukocyte esterase. Code sepsis was actived in ER and he was recommended for treatment of UTI.  He declined admission and went home with Keflex  500mg  4 times daily for 10 days.  INTERVAL HISTORY CHRISTOFER SHEN is a 73 y.o. male who has above history reviewed by me today presents for follow up visit for management of  Stage IV lung adenocarcinoma Accompanied by care give.   + back pain, and left lower extremity weakness due to pain, on fentanyl  patch 75mcg Q72 hours and percocet Lower extremity weakness localizes best to lumbosacral nerve roots, known spondylosis with multiple foci of neuro-foraminal stenosis.  02/09/2023 S/p decompression neurosurgery  02/14/2023 ED visit due to  lethargy and hypotension, BP improved at the time of initial evaluation in ED. He was given hydration and empiric rocephin . Switched to Cefdinir .  Urine culture showed multi species.  Today he feels well.     Review of Systems  Constitutional:   Positive for appetite change, fatigue and unexpected weight change. Negative for chills and fever.  HENT:   Negative for hearing loss and voice change.   Eyes:  Negative for eye problems and icterus.  Respiratory:  Positive for shortness of breath. Negative for chest tightness and cough.   Cardiovascular:  Positive for leg swelling. Negative for chest pain.  Gastrointestinal:  Negative for abdominal distention, abdominal pain and blood in stool.  Endocrine: Negative for hot flashes.  Genitourinary:  Negative for difficulty urinating, dysuria and frequency.   Musculoskeletal:  Positive for back pain. Negative for arthralgias.  Skin:  Negative for itching and rash.  Neurological:  Negative for extremity weakness, headaches, light-headedness and numbness.  Hematological:  Negative for adenopathy. Does not bruise/bleed easily.  Psychiatric/Behavioral:  Positive for sleep disturbance. Negative for confusion.     MEDICAL HISTORY:  Past Medical History:  Diagnosis Date   Allergy    Anxiety    BPH (benign prostatic hypertrophy)    Cancer (HCC)    Melanoma on Neck    2008   Carotid artery occlusion    CHF (congestive heart failure) (HCC)    Diabetes mellitus    type 2   ED (erectile dysfunction)    GERD (gastroesophageal reflux disease)    Hemorrhagic stroke (HCC) 06/2021   Hyperlipidemia  Hypertension    Lung cancer (HCC)    Neoplasm related pain    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)    Stroke [facility])     SURGICAL HISTORY: Past Surgical History:  Procedure Laterality Date   ANTERIOR CERVICAL DECOMP/DISCECTOMY FUSION N/A 02/09/2023   Procedure: C3-5 ANTERIOR CERVICAL DISCECTOMY AND FUSION;  Surgeon: Clois Fret, MD;  Location: [facility] ORS;  Service: Neurosurgery;  Laterality: N/A;   BRONCHIAL NEEDLE ASPIRATION BIOPSY  06/17/2021   Procedure: BRONCHIAL NEEDLE ASPIRATION BIOPSIES;  Surgeon: Claudene Toribio BROCKS, MD;  Location: [facility] ENDOSCOPY;  Service: Pulmonary;;   IR IMAGING GUIDED  PORT INSERTION  08/05/2021   MELANOMA EXCISION  2008   Left side of neck   RADIOLOGY WITH ANESTHESIA N/A 04/05/2020   Procedure: MRI SPINE WITOUT CONTRAST;  Surgeon: Radiologist, Medication, MD;  Location: [facility] OR;  Service: Radiology;  Laterality: N/A;   RADIOLOGY WITH ANESTHESIA N/A 08/22/2021   Procedure: MRI BRAIN WITH AND WITHOUT CONTRAST  WITH ANESTHESIA;  Surgeon: Radiologist, Medication, MD;  Location: [facility] OR;  Service: Radiology;  Laterality: N/A;   RADIOLOGY WITH ANESTHESIA N/A 12/17/2021   Procedure: MRI BRAIN WITH AND WITHOUT CONTRAST WITH ANESTHESIA; MRI LUMBER WITH AND WITHOUT CONTRAST;  Surgeon: Radiologist, Medication, MD;  Location: [facility] OR;  Service: Radiology;  Laterality: N/A;   RADIOLOGY WITH ANESTHESIA N/A 04/08/2022   Procedure: MRI LUMBER SPINE WITH AND WITHOUT CONTRAST;  Surgeon: Radiologist, Medication, MD;  Location: [facility] OR;  Service: Radiology;  Laterality: N/A;   TONSILLECTOMY     VIDEO BRONCHOSCOPY WITH ENDOBRONCHIAL ULTRASOUND N/A 06/17/2021   Procedure: VIDEO BRONCHOSCOPY WITH ENDOBRONCHIAL ULTRASOUND;  Surgeon: Claudene Toribio BROCKS, MD;  Location: [facility] ENDOSCOPY;  Service: Pulmonary;  Laterality: N/A;    SOCIAL HISTORY: Social History   Socioeconomic History   Marital status: Married    Spouse name: Not on file   Number of children: Not on file   Years of education: Not on file   Highest education level: Not on file  Occupational History   Not on file  Tobacco Use   Smoking status: Former    Current packs/day: 0.00    Types: Cigarettes    Quit date: 07/21/1990    Years since quitting: 32.6   Smokeless tobacco: Never  Vaping Use   Vaping status: Never Used  Substance and Sexual Activity   Alcohol use: No   Drug use: No   Sexual activity: Not Currently  Other Topics Concern   Not on file  Social History Narrative   Not on file   Social Drivers of Health   Financial Resource Strain: Low Risk  (04/17/2022)   Overall Financial Resource Strain (CARDIA)     Difficulty of Paying Living Expenses: Not hard at all  Food Insecurity: No Food Insecurity (02/09/2023)   Hunger Vital Sign    Worried About Running Out of Food in the Last Year: Never true    Ran Out of Food in the Last Year: Never true  Transportation Needs: No Transportation Needs (02/09/2023)   PRAPARE - Administrator, Civil Service (Medical): No    Lack of Transportation (Non-Medical): No  Physical Activity: Inactive (03/27/2022)   Exercise Vital Sign    Days of Exercise per Week: 0 days    Minutes of Exercise per Session: 0 min  Stress: Stress Concern Present (03/27/2022)   [employer] of Occupational Health - Occupational Stress Questionnaire    Feeling of Stress : To some extent  Social Connections: Unknown (11/17/2022)  [facility]   Social Network    Social Network: Not on file  Intimate Partner Violence: Not At Risk (02/09/2023)   Humiliation, Afraid, Rape, and Kick questionnaire    Fear of Current or Ex-Partner: No    Emotionally Abused: No    Physically Abused: No    Sexually Abused: No    FAMILY HISTORY: Family History  Problem Relation Age of Onset   COPD Mother    Heart disease Father    Heart disease Brother        MI at age 47   Stroke Neg Hx     ALLERGIES:  is allergic to niaspan  [niacin ].  MEDICATIONS:  Current Outpatient Medications  Medication Sig Dispense Refill   ALPRAZolam  (XANAX ) 0.5 MG tablet Take 1 tablet (0.5 mg total) by mouth at bedtime as needed for anxiety. 15 tablet 0   celecoxib  (CELEBREX ) 200 MG capsule Take 1 capsule (200 mg total) by mouth 2 (two) times daily. 60 capsule 0   citalopram  (CELEXA ) 10 MG tablet Take 1 tablet (10 mg total) by mouth daily. 30 tablet 3   docusate sodium  (COLACE) 100 MG capsule Take 100 mg by mouth 2 (two) times daily as needed for mild constipation.     fentaNYL  (DURAGESIC ) 75 MCG/HR Place 1 patch onto the skin every 3 (three) days. 10 patch 0   finasteride  (PROSCAR ) 5 MG  tablet Take 1 tablet (5 mg total) by mouth daily. 30 tablet 0   gabapentin  (NEURONTIN ) 300 MG capsule Take 2 capsules (600 mg total) by mouth 2 (two) times daily. 120 capsule 2   insulin  glargine (LANTUS ) 100 UNIT/ML injection Inject 0-30 Units into the skin daily as needed (High Blood Sugar over 150).     levETIRAcetam  (KEPPRA ) 500 MG tablet Take 1 tablet (500 mg total) by mouth 2 (two) times daily. 60 tablet 5   megestrol  (MEGACE ) 40 MG tablet Take 1 tablet (40 mg total) by mouth daily. 30 tablet 3   methocarbamol  (ROBAXIN ) 500 MG tablet Take 1 tablet (500 mg total) by mouth every 6 (six) hours as needed for muscle spasms. 120 tablet 0   NOVOLOG  FLEXPEN 100 UNIT/ML FlexPen Inject 0-10 Units into the skin daily as needed for high blood sugar (BG > 200).     Nystatin  (GERHARDT'S BUTT CREAM) CREA Apply 14 Applications topically 2 (two) times daily. 1 each 0   omeprazole  (PRILOSEC) 20 MG capsule Take 20 mg by mouth daily.     oxyCODONE -acetaminophen  (PERCOCET) 7.5-325 MG tablet Take 1 tablet by mouth every 4 (four) hours as needed for severe pain (pain score 7-10). Do not take with cough medication or other pain medication 60 tablet 0   senna (SENOKOT) 8.6 MG TABS tablet Take 1 tablet (8.6 mg total) by mouth 2 (two) times daily as needed for mild constipation. 30 tablet 0   valsartan  (DIOVAN ) 80 MG tablet Take 80 mg by mouth daily.     potassium chloride  SA (KLOR-CON  M) 20 MEQ tablet Take 1 tablet (20 mEq total) by mouth daily. (Patient not taking: Reported on 03/10/2023) 3 tablet 0   No current facility-administered medications for this visit.   Facility-Administered Medications Ordered in Other Visits  Medication Dose Route Frequency Provider Last Rate Last Admin   heparin  lock flush 100 UNIT/ML injection            sodium chloride  flush (NS) 0.9 % injection 10 mL  10 mL Intracatheter PRN Babara Call, MD   10  mL at 03/10/23 1610     PHYSICAL EXAMINATION:  Vitals:   03/10/23 1313  BP: 110/66   Pulse: 84  Temp: 98.2 F (36.8 C)  SpO2: 99%   Filed Weights   03/10/23 1313  Weight: 132 lb (59.9 kg)    Physical Exam Constitutional:      General: He is not in acute distress.    Appearance: He is ill-appearing.  HENT:     Head: Normocephalic and atraumatic.  Eyes:     General: No scleral icterus. Cardiovascular:     Rate and Rhythm: Normal rate and regular rhythm.     Heart sounds: Normal heart sounds.  Pulmonary:     Effort: Pulmonary effort is normal. No respiratory distress.     Breath sounds: No wheezing.     Comments: Decreased breath sound bilaterally. Abdominal:     General: Bowel sounds are normal. There is no distension.     Palpations: Abdomen is soft.  Musculoskeletal:        General: No deformity. Normal range of motion.     Cervical back: Normal range of motion and neck supple.     Comments: Bilateral ankle trace edema.  Skin:    General: Skin is warm and dry.     Findings: No rash.  Neurological:     Mental Status: He is alert. Mental status is at baseline.     Cranial Nerves: No cranial nerve deficit.     Comments: Chronic left-sided weakness  Psychiatric:        Mood and Affect: Mood normal.     LABORATORY DATA:  I have reviewed the data as listed    Latest Ref Rng & Units 03/10/2023   12:56 PM 02/17/2023    1:01 PM 02/13/2023    7:37 PM  CBC  WBC 4.0 - 10.5 K/uL 6.5  8.4  7.4   Hemoglobin 13.0 - 17.0 g/dL 88.6  88.4  87.8   Hematocrit 39.0 - 52.0 % 34.9  34.5  38.5   Platelets 150 - 400 K/uL 219  359  411       Latest Ref Rng & Units 03/10/2023   12:56 PM 02/17/2023    1:01 PM 02/13/2023    8:47 PM  CMP  Glucose 70 - 99 mg/dL 848  876    BUN 8 - 23 mg/dL 19  23  25    Creatinine 0.61 - 1.24 mg/dL 8.82  9.04  8.78   Sodium 135 - 145 mmol/L 137  136    Potassium 3.5 - 5.1 mmol/L 3.8  4.7    Chloride 98 - 111 mmol/L 102  105    CO2 22 - 32 mmol/L 26  23    Calcium  8.9 - 10.3 mg/dL 8.9  9.1    Total Protein 6.5 - 8.1 g/dL 6.6  7.1     Total Bilirubin 0.0 - 1.2 mg/dL 0.4  0.5    Alkaline Phos 38 - 126 U/L 65  60    AST 15 - 41 U/L 14  13    ALT 0 - 44 U/L 9  11       Iron /TIBC/Ferritin/ %Sat    Component Value Date/Time   IRON  60 02/03/2022 0958   TIBC 391 02/03/2022 0958   FERRITIN 704 (H) 02/03/2022 0958   IRONPCTSAT 15 (L) 02/03/2022 9041      RADIOGRAPHIC STUDIES: I have personally reviewed the radiological images as listed and agreed with the findings in the report. CT  CHEST ABDOMEN PELVIS WO CONTRAST Result Date: 03/06/2023 CLINICAL DATA:  Follow up stage IV lung cancer with bone metastases. Prior left lung radiation. * Tracking Code: BO * EXAM: CT CHEST, ABDOMEN AND PELVIS WITHOUT CONTRAST TECHNIQUE: Multidetector CT imaging of the chest, abdomen and pelvis was performed following the standard protocol without IV contrast. RADIATION DOSE REDUCTION: This exam was performed according to the departmental dose-optimization program which includes automated exposure control, adjustment of the mA and/or kV according to patient size and/or use of iterative reconstruction technique. COMPARISON:  CTA chest 10/01/2022. Postcontrast CT 12/19/2022. PET-CT scan 07/03/2022. Chest abdomen pelvis CT 04/28/2022. FINDINGS: CT CHEST FINDINGS Cardiovascular: Right upper chest port in place with tip seen extending to the central SVC. The heart is nonenlarged. Small pericardial effusion. Coronary artery calcifications are seen. The thoracic aorta has a normal course and caliber with mild calcified atherosclerotic plaque. Mediastinum/Nodes: Small thyroid  gland. Slightly patulous thoracic esophagus. On this non IV contrast exam, overall there is no specific abnormal lymph node enlargement seen in the axillary regions, hilum or mediastinum. Lungs/Pleura: Is some linear opacity in the right lung base likely scar or atelectasis. No pleural effusion. Right lung is without consolidation, pneumothorax or effusion. No dominant right-sided lung nodule.  There is some volume loss along left hemithorax. There is some bandlike areas of opacity along the left lower lobe likely scar or atelectasis. Along the posterior aspect of the lingula previously with a lobular mass measuring 2.3 x 1.8 cm on the study of October 2024. This has a area today has a more surrounding opacity extending out to the pleura with some thickening. This does obscure the confines of the mass but when measured in the same fashion as on the prior area measures a proximally 3.4 by 2.4 cm, larger. The peripheral areas of consolidative opacity along the left lower lobe is decreasing with some residual on axial series 4 image 88. Few small rounded ground-glass areas dependently in the left lower lobe are noted more caudal. This has increased. Musculoskeletal: There is some curvature of the spine with some degenerative changes. Unilateral left rib fracture. Stable. CT ABDOMEN PELVIS FINDINGS Hepatobiliary: With the limits of non IV contrast, grossly preserved hepatic parenchyma. Gallbladder is nondilated. Pancreas: Moderate fatty atrophy of the pancreas. No obvious mass on this noncontrast study. Spleen: Normal in size without focal abnormality. Adrenals/Urinary Tract: Adrenal glands are preserved. There is some contrast in the renal collecting systems bilaterally. Please correlate with previous postcontrast MRI of the head. No collecting system dilatation. Mild atrophy of the left kidney. There is a low-attenuation lesion along the anterior aspect of the left kidney measuring 20 mm with Hounsfield unit of 7, Bosniak 1 cyst and unchanged from prior. Preserved contours of the urinary bladder. Stomach/Bowel: Moderate colonic stool. The large bowel overall has a normal course and caliber. The stomach and small bowel are nondilated as well. The stomach is relatively collapsed. Vascular/Lymphatic: Aortic atherosclerosis. No enlarged abdominal or pelvic lymph nodes. Reproductive: Prostate is unremarkable.  Other: Mild anasarca.  No free air or free fluid. Musculoskeletal: Osteopenia degenerative changes. There are some sclerotic bone lesions identified once again such as the iliac bones, thoracolumbar spine. Distribution appears similar to previous. Posttraumatic deformity of the right transverse process also seen at L4. IMPRESSION: Increasing opacity along the lingula with the increasing nodular central component compared to the recent CT scan. There is also some changing opacities in the left lower lobe. The larger more peripheral area in the left lower lobe  is decreasing with some new areas of subtle ground-glass more caudal. Attention on follow-up. No developing new other mass lesion or nodal enlargement. Scattered sclerotic bone lesions has a similar distribution to previous. Overall evaluation for solid organ pathology including solid organ metastases are limited without the advantage of IV contrast. Electronically Signed   By: Ranell Bring M.D.   On: 03/06/2023 14:33   DG Chest 2 View Result Date: 02/13/2023 CLINICAL DATA:  Suspected sepsis EXAM: CHEST - 2 VIEW COMPARISON:  01/31/2023, CT chest 12/19/2022 FINDINGS: Right-sided central venous port tip at the cavoatrial region. 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 No acute airspace disease. Stable cardiomediastinal silhouette. No pneumothorax. IMPRESSION: No active cardiopulmonary disease. 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 Electronically Signed   By: Luke Bun M.D.   On: 02/13/2023 22:04   DG Cervical Spine 2-3 Views Result Date: 02/09/2023 CLINICAL DATA:  C3-5 ACDF. EXAM: CERVICAL SPINE - 2-3 VIEW COMPARISON:  None Available. FINDINGS: Three C-arm images of the cervical spine demonstrate an initial localizer at the C3-4 level follow-up interbody and anterior screw and plate fusion at the C3-5 levels. Normal alignment. IMPRESSION: C3-5 fusion.  Electronically Signed   By: Elspeth Bathe M.D.   On: 02/09/2023 15:01   DG C-Arm 1-60 Min-No Report Result Date: 02/09/2023 Fluoroscopy was utilized by the requesting physician.  No radiographic interpretation.   DG C-Arm 1-60 Min-No Report Result Date: 02/09/2023 Fluoroscopy was utilized by the requesting physician.  No radiographic interpretation.

## 2023-03-10 NOTE — Assessment & Plan Note (Addendum)
 Recurrent UTI, increased frequency since the utilization of condom catheter.  Recent urine culture is + multiple bacteria growth..  Refer to urology for evaluation.

## 2023-03-10 NOTE — Assessment & Plan Note (Addendum)
 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  S/p lung radiation to left lung.  12/19/2022, CT chest stable disease.  Labs are reviewed and discussed with patient.  CT chest abdomen pelvis wo contrast results were reviewed with patient and wife.  There is increased opacity along the lingula with increasing nodular central component.  Left lower lobe opacity decreased.  No developing new other mass lesions or nodal enlargement.  Scattered sclerotic bone lesions. Increased opacity could be secondary to radiation changes.  Disease progression is also a possibility. Patient is currently asymptomatic.  He also is not candidate for aggressive treatments. I recommend to continue Keytruda  and repeat CT in 3 months for close monitoring.

## 2023-03-10 NOTE — Assessment & Plan Note (Signed)
 Immunotherapy plan as listed above

## 2023-03-10 NOTE — Patient Instructions (Signed)

## 2023-03-10 NOTE — Assessment & Plan Note (Signed)
 Fingertip neuropathy intermittent Grade 1, feet neuropathy persistent, Grade 2.  On Gabapentin 300mg  TID

## 2023-03-10 NOTE — Assessment & Plan Note (Signed)
 Recommend Zometa  every 4- 6 weeks.-  hold off today due to possible upcoming surgery Continue calcium  supplementation.  MRI lumbar with and without contrast showed  Stable remote superior endplate fracture of L2 and large Schmorl to noted involving L5. 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 PET scan shows stable bone lesions.  L4 fracture uptake likely benign- s/p nerve block - pending neurosurgery evaluation.  Hold off Zometa  due to recent back decompression surgery [02/09/2023].

## 2023-03-10 NOTE — Patient Instructions (Signed)
 [facility] - A DEPT OF [facility]  Discharge Instructions: Thank you for choosing [facility] to provide your oncology and hematology care.  If you have a lab appointment with the Cancer Center, please go directly to the Cancer Center and check in at the registration area.  Wear comfortable clothing and clothing appropriate for easy access to any Portacath or PICC line.   We strive to give you quality time with your provider. You may need to reschedule your appointment if you arrive late (15 or more minutes).  Arriving late affects you and other patients whose appointments are after yours.  Also, if you miss three or more appointments without notifying the office, you may be dismissed from the clinic at the provider's discretion.      For prescription refill requests, have your pharmacy contact our office and allow 72 hours for refills to be completed.    Today you received the following chemotherapy and/or immunotherapy agents [name]      To help prevent nausea and vomiting after your treatment, we encourage you to take your nausea medication as directed.  BELOW ARE SYMPTOMS THAT SHOULD BE REPORTED IMMEDIATELY: *FEVER GREATER THAN 100.4 F (38 C) OR HIGHER *CHILLS OR SWEATING *NAUSEA AND VOMITING THAT IS NOT CONTROLLED WITH YOUR NAUSEA MEDICATION *UNUSUAL SHORTNESS OF BREATH *UNUSUAL BRUISING OR BLEEDING *URINARY PROBLEMS (pain or burning when urinating, or frequent urination) *BOWEL PROBLEMS (unusual diarrhea, constipation, pain near the anus) TENDERNESS IN MOUTH AND THROAT WITH OR WITHOUT PRESENCE OF ULCERS (sore throat, sores in mouth, or a toothache) UNUSUAL RASH, SWELLING OR PAIN  UNUSUAL VAGINAL DISCHARGE OR ITCHING   Items with * indicate a potential emergency and should be followed up as soon as possible or go to the Emergency Department if any problems should occur.  Please show the CHEMOTHERAPY ALERT CARD or IMMUNOTHERAPY  ALERT CARD at check-in to the Emergency Department and triage nurse.  Should you have questions after your visit or need to cancel or reschedule your appointment, please contact [facility] - A DEPT OF [facility]  [phone] and follow the prompts.  Office hours are 8:00 a.m. to 4:30 p.m. Monday - Friday. Please note that voicemails left after 4:00 p.m. may not be returned until the following business day.  We are closed weekends and major holidays. You have access to a nurse at all times for urgent questions. Please call the main number to the clinic [phone] and follow the prompts.  For any non-urgent questions, you may also contact your provider using MyChart. We now offer e-Visits for anyone 48 and older to request care online for non-urgent symptoms. For details visit [email or web address].   Also download the MyChart app! Go to the app store, search "MyChart", open the app, select [facility], and log in with your MyChart username and password.

## 2023-03-11 ENCOUNTER — Other Ambulatory Visit: Payer: Self-pay | Admitting: *Deleted

## 2023-03-11 ENCOUNTER — Encounter: Payer: Self-pay | Admitting: Radiation Oncology

## 2023-03-11 ENCOUNTER — Ambulatory Visit
Admission: RE | Admit: 2023-03-11 | Discharge: 2023-03-11 | Disposition: A | Discharge status: Home / Self Care | Payer: PPO | Source: Ambulatory Visit | Attending: Radiation Oncology | Admitting: Radiation Oncology

## 2023-03-11 VITALS — BP 108/71 | HR 81 | Temp 97.3°F | Resp 16

## 2023-03-11 DIAGNOSIS — Z923 Personal history of irradiation: Secondary | ICD-10-CM | POA: Insufficient documentation

## 2023-03-11 DIAGNOSIS — C7951 Secondary malignant neoplasm of bone: Secondary | ICD-10-CM | POA: Insufficient documentation

## 2023-03-11 DIAGNOSIS — C3412 Malignant neoplasm of upper lobe, left bronchus or lung: Secondary | ICD-10-CM

## 2023-03-11 DIAGNOSIS — M549 Dorsalgia, unspecified: Secondary | ICD-10-CM | POA: Diagnosis not present

## 2023-03-11 NOTE — Progress Notes (Signed)
 Radiation Oncology Follow up Note  Name: Micheal Donovan   Date:   03/11/2023 MRN:  [id number] DOB: 1950/05/10    This 73 y.o. male presents to the clinic today for reevaluation of chest inpatient status post SBRT to his left lung and patient with known stage IV adenocarcinoma of the lung.  REFERRING PROVIDER: Duanne Butler DASEN, MD  HPI: Patient is a 72 year old male now out 7 months having completed SBRT to his left lung the patient with known stage IV adenocarcinoma of the lung.SABRA  He has had decompression of his cervical stenosis by Dr. Katrina recently.  Still has some slight back pain.  He is otherwise asymptomatic.  Recently had a CT scan showing increasing opacity along the lingula with increasing nodular central component compared to recent CT scan.  No new developing mass lesion or nodule enlargement.  He specifically has cough hemoptysis or chest tightness he is referred back from medical oncology for consideration of further treatment.  COMPLICATIONS OF TREATMENT: none  FOLLOW UP COMPLIANCE: keeps appointments   PHYSICAL EXAM:  BP 108/71   Pulse 81   Temp (!) 97.3 F (36.3 C)   Resp 16  Wheelchair-bound male in NAD.  Does have a catheter placed for urination.  Well-developed well-nourished patient in NAD. HEENT reveals PERLA, EOMI, discs not visualized.  Oral cavity is clear. No oral mucosal lesions are identified. Neck is clear without evidence of cervical or supraclavicular adenopathy. Lungs are clear to A&P. Cardiac examination is essentially unremarkable with regular rate and rhythm without murmur rub or thrill. Abdomen is benign with no organomegaly or masses noted. Motor sensory and DTR levels are equal and symmetric in the upper and lower extremities. Cranial nerves II through XII are grossly intact. Proprioception is intact. No peripheral adenopathy or edema is identified. No motor or sensory levels are noted. Crude visual fields are within normal range.  RADIOLOGY RESULTS:  CT scan reviewed compatible with above-stated findings  PLAN: Present time I will obtain a PET CT scan to better delineate possibility of recurrent disease in his chest and evaluate for other areas of possible metastatic involvement.  Will see him back about a week after PET scan is performed and discussed treatment options at that time.  Patient and wife both comprehend my recommendations well.  I would like to take this opportunity to thank you for allowing me to participate in the care of your patient.SABRA Marcey Penton, MD

## 2023-03-16 ENCOUNTER — Encounter: Payer: Self-pay | Admitting: Neurosurgery

## 2023-03-17 ENCOUNTER — Other Ambulatory Visit: Payer: Self-pay | Admitting: Family Medicine

## 2023-03-17 ENCOUNTER — Ambulatory Visit
Admission: RE | Admit: 2023-03-17 | Discharge: 2023-03-17 | Disposition: A | Discharge status: Home / Self Care | Payer: PPO | Source: Ambulatory Visit | Attending: Radiation Oncology | Admitting: Radiation Oncology

## 2023-03-17 DIAGNOSIS — Z923 Personal history of irradiation: Secondary | ICD-10-CM | POA: Insufficient documentation

## 2023-03-17 DIAGNOSIS — I700 Atherosclerosis of aorta: Secondary | ICD-10-CM | POA: Insufficient documentation

## 2023-03-17 DIAGNOSIS — I2510 Atherosclerotic heart disease of native coronary artery without angina pectoris: Secondary | ICD-10-CM | POA: Insufficient documentation

## 2023-03-17 DIAGNOSIS — C7951 Secondary malignant neoplasm of bone: Secondary | ICD-10-CM | POA: Diagnosis not present

## 2023-03-17 DIAGNOSIS — C3412 Malignant neoplasm of upper lobe, left bronchus or lung: Secondary | ICD-10-CM | POA: Insufficient documentation

## 2023-03-17 LAB — GLUCOSE, CAPILLARY: Glucose-Capillary: 97 mg/dL (ref 70–99)

## 2023-03-17 MED ORDER — FLUDEOXYGLUCOSE F - 18 (FDG) INJECTION: 6.2400 | Freq: Once | INTRAVENOUS | Status: DC | PRN

## 2023-03-18 NOTE — Telephone Encounter (Signed)
 Requested medication (s) are due for refill today: historical medication  Requested medication (s) are on the active medication list: yes  Last refill:  12/08/22  Future visit scheduled: no   Notes to clinic:  historical medication. Do you want to order Rx?     Requested Prescriptions  Pending Prescriptions Disp Refills   valsartan  (DIOVAN ) 80 MG tablet [Pharmacy Med Name: VALSARTAN  80MG  TABLETS] 90 tablet     Sig: TAKE 1 TABLET(80 MG) BY MOUTH DAILY     Cardiovascular:  Angiotensin Receptor Blockers Failed - 03/18/2023 10:15 AM      Failed - Valid encounter within last 6 months    Recent Outpatient Visits           1 year ago Uncontrolled type 2 diabetes mellitus with hypoglycemia, unspecified hypoglycemia coma status (HCC)   [facility] Pickard, Cisco Crest, MD   1 year ago Uncontrolled type 2 diabetes mellitus with hypoglycemia, unspecified hypoglycemia coma status (HCC)   [facility] Pickard, Cisco Crest, MD   4 years ago Pruritus   [facility] Cheril Cork, Cisco Crest, MD   5 years ago Encounter for hepatitis C screening test for low risk patient   [facility] Austine Lefort, MD   6 years ago Type 2 diabetes mellitus without complication, with long-term current use of insulin  (HCC)   [facility] Shokan, Marvell Slider, MD       Future Appointments             In 1 month Stoioff, Kizzie Perks, MD [facility] [place]            Passed - Cr in normal range and within 180 days    Creat  Date Value Ref Range Status  10/09/2022 1.11 0.70 - 1.28 mg/dL Final   Creatinine, Ser  Date Value Ref Range Status  03/10/2023 1.17 0.61 - 1.24 mg/dL Final   Creatinine, Urine  Date Value Ref Range Status  03/06/2016 162 20 - 370 mg/dL Final         Passed - K in normal range and within 180 days    Potassium  Date Value Ref Range Status  03/10/2023 3.8 3.5 - 5.1 mmol/L Final         Passed -  Patient is not pregnant      Passed - Last BP in normal range    BP Readings from Last 1 Encounters:  03/11/23 108/71

## 2023-03-20 ENCOUNTER — Encounter: Payer: Self-pay | Admitting: Family Medicine

## 2023-03-20 MED ORDER — BD PEN NEEDLE MINI U/F 31G X 5 MM MISC: 3 refills | Status: DC

## 2023-03-23 ENCOUNTER — Other Ambulatory Visit: Payer: Self-pay

## 2023-03-23 DIAGNOSIS — G959 Disease of spinal cord, unspecified: Secondary | ICD-10-CM

## 2023-03-24 ENCOUNTER — Encounter: Payer: Self-pay | Admitting: Radiation Oncology

## 2023-03-24 ENCOUNTER — Ambulatory Visit
Admission: RE | Admit: 2023-03-24 | Discharge: 2023-03-24 | Disposition: A | Discharge status: Home / Self Care | Payer: PPO | Source: Ambulatory Visit | Attending: Neurosurgery | Admitting: Neurosurgery

## 2023-03-24 ENCOUNTER — Other Ambulatory Visit: Payer: Self-pay | Admitting: *Deleted

## 2023-03-24 ENCOUNTER — Ambulatory Visit
Admission: RE | Admit: 2023-03-24 | Discharge: 2023-03-24 | Disposition: A | Discharge status: Home / Self Care | Payer: PPO | Attending: Neurosurgery | Admitting: Neurosurgery

## 2023-03-24 ENCOUNTER — Ambulatory Visit (INDEPENDENT_AMBULATORY_CARE_PROVIDER_SITE_OTHER): Payer: PPO | Admitting: Neurosurgery

## 2023-03-24 ENCOUNTER — Ambulatory Visit
Admission: RE | Admit: 2023-03-24 | Discharge: 2023-03-24 | Disposition: A | Discharge status: Home / Self Care | Payer: PPO | Source: Ambulatory Visit | Attending: Radiation Oncology | Admitting: Radiation Oncology

## 2023-03-24 VITALS — BP 152/86 | HR 71 | Temp 97.6°F | Resp 16

## 2023-03-24 VITALS — BP 128/82 | Temp 98.0°F

## 2023-03-24 DIAGNOSIS — C7951 Secondary malignant neoplasm of bone: Secondary | ICD-10-CM

## 2023-03-24 DIAGNOSIS — G959 Disease of spinal cord, unspecified: Secondary | ICD-10-CM

## 2023-03-24 DIAGNOSIS — Z981 Arthrodesis status: Secondary | ICD-10-CM | POA: Diagnosis not present

## 2023-03-24 DIAGNOSIS — C3412 Malignant neoplasm of upper lobe, left bronchus or lung: Secondary | ICD-10-CM

## 2023-03-24 DIAGNOSIS — Z923 Personal history of irradiation: Secondary | ICD-10-CM | POA: Diagnosis not present

## 2023-03-24 DIAGNOSIS — M4322 Fusion of spine, cervical region: Secondary | ICD-10-CM | POA: Diagnosis not present

## 2023-03-24 DIAGNOSIS — Z09 Encounter for follow-up examination after completed treatment for conditions other than malignant neoplasm: Secondary | ICD-10-CM

## 2023-03-24 NOTE — Progress Notes (Signed)
   REFERRING PHYSICIAN:  Donita Brooks, Md [address] [place],  [place] [address]  DOS: 02/09/23 C3-5 ACDF  HISTORY OF PRESENT ILLNESS: Micheal Donovan is status post ACDF for cervical stenosis and myelopathy.  He has seen some improvement in his strength.  He walked 30 feet last week.     PHYSICAL EXAMINATION:  NEUROLOGICAL:  General: In no acute distress.   Awake, alert, oriented to person, place, and time.  Pupils equal round and reactive to light.  Facial tone is symmetric.  Tongue protrusion is midline.  There is no pronator drift.  Strength: Side Biceps Triceps Deltoid Interossei Grip Wrist Ext. Wrist Flex.  R 5 5 5 5 5 5 5   L 5 5 5 5 5 5 5    Incision c/d/I and healing well  Imaging:  No complications noted.  Some settling at C4-5 as expected.  Assessment / Plan: Micheal Donovan is doing well after recent ACDF.  He has shown improvement in his neurologic function.  I expect that he will continue to improve.  He will continue with physical therapy.  We reviewed his activity limitations.  Will see him back in clinic in approximately 6 weeks.    Venetia Night MD Dept of Neurosurgery

## 2023-03-24 NOTE — Progress Notes (Signed)
Radiation Oncology Follow up Note  Name: Micheal Donovan   Date:   03/24/2023 MRN:  [id number] DOB: 1950-12-03    This 73 y.o. male presents to the clinic today for follow-up in patient about 8 months having completed SBRT to his left lung and patient with known stage IV adenocarcinoma the lung with progressive changes on CT underwent PET/CT for clarification.  REFERRING PROVIDER: Donita Brooks, MD  HPI: Patient is a 73 year old male now out 8 months having completed SBRT to his left lung in a patient with known stage IV adenocarcinoma of the lung.  He was fairly asymptomatic although.  Recent CT scan showed increasing opacity along the lingula and increasing nodule in the central component compared to his recent CT scan.  We went on and did a PET CT scan which only showed postradiation consolidation in the lingula and mild residual focal hypermetabolic activity overall improved no evidence of disease progression.  He is clinically doing well and is without complaint.  COMPLICATIONS OF TREATMENT: none  FOLLOW UP COMPLIANCE: keeps appointments   PHYSICAL EXAM:  BP (!) 152/86   Pulse 71   Temp 97.6 F (36.4 C) (Temporal)   Resp 16  Well-developed well-nourished patient in NAD. HEENT reveals PERLA, EOMI, discs not visualized.  Oral cavity is clear. No oral mucosal lesions are identified. Neck is clear without evidence of cervical or supraclavicular adenopathy. Lungs are clear to A&P. Cardiac examination is essentially unremarkable with regular rate and rhythm without murmur rub or thrill. Abdomen is benign with no organomegaly or masses noted. Motor sensory and DTR levels are equal and symmetric in the upper and lower extremities. Cranial nerves II through XII are grossly intact. Proprioception is intact. No peripheral adenopathy or edema is identified. No motor or sensory levels are noted. Crude visual fields are within normal range.  RADIOLOGY RESULTS: CT scan and PET CT scans reviewed  compatible with above-stated findings  PLAN: Present time patient is doing well no evidence of progression disease by PET/CT criteria.  Of asked to see him back in 6 months for follow-up with repeat CT scan at that time.  Patient knows to call in anytime with any concerns.  I would like to take this opportunity to thank you for allowing me to participate in the care of your patient.[name], MD

## 2023-03-26 ENCOUNTER — Encounter: Payer: Self-pay | Admitting: Urology

## 2023-03-26 ENCOUNTER — Ambulatory Visit: Payer: PPO | Admitting: Urology

## 2023-03-26 VITALS — BP 137/71 | HR 80 | Ht 67.0 in | Wt 132.0 lb

## 2023-03-26 DIAGNOSIS — R399 Unspecified symptoms and signs involving the genitourinary system: Secondary | ICD-10-CM | POA: Diagnosis not present

## 2023-03-26 DIAGNOSIS — N390 Urinary tract infection, site not specified: Secondary | ICD-10-CM | POA: Diagnosis not present

## 2023-03-26 DIAGNOSIS — R8281 Pyuria: Secondary | ICD-10-CM | POA: Diagnosis not present

## 2023-03-26 LAB — URINALYSIS, COMPLETE
Bilirubin, UA: NEGATIVE
Glucose, UA: NEGATIVE
Ketones, UA: NEGATIVE
Nitrite, UA: POSITIVE — AB
Protein,UA: NEGATIVE
Specific Gravity, UA: 1.025 (ref 1.005–1.030)
Urobilinogen, Ur: 0.2 mg/dL (ref 0.2–1.0)
pH, UA: 6 (ref 5.0–7.5)

## 2023-03-26 LAB — MICROSCOPIC EXAMINATION: WBC, UA: >30 /[HPF] — AB (ref 0–5)

## 2023-03-26 LAB — BLADDER SCAN AMB NON-IMAGING: Scan Result: 86

## 2023-03-26 MED ORDER — TAMSULOSIN HCL 0.4 MG PO CAPS: 0.4000 mg | ORAL_CAPSULE | Freq: Every day | ORAL | 2 refills | Status: DC

## 2023-03-26 NOTE — Progress Notes (Signed)
I, Maysun Anabel Bene, acting as a scribe for Riki Altes, MD., have documented all relevant documentation on the behalf of Riki Altes, MD, as directed by Riki Altes, MD while in the presence of Riki Altes, MD.  03/26/2023 3:02 PM   Micheal Donovan 05/07/1950 [id number]  Referring provider: [name], MD [address] [place],  [place] [address]  Chief Complaint  Patient presents with   Recurrent UTI    HPI: Micheal Donovan is a 73 y.o. male referred for evaluation of recurrent UTI.  He presents today with his spouse.  He had a positive urine culture in October 2024 for Klebsiella. Follow-up urinalysis have shown persistent pyuria, however 2 subsequent urine cultures have grown multiple species none predominant.  Followed by medical and radiation oncology for non-small cell lung cancer. He is status post anterior cervical discectomy/fusion December 2024 He currently wears a condom catheter secondary to decreased mobility.  He has urinary urgency, urge incontinence, hesitancy. He is on finasteride. A CT abd/pelvis without contrast 02/24/23 showed no GU abnormalities.   PMH: Past Medical History:  Diagnosis Date   Allergy    Anxiety    BPH (benign prostatic hypertrophy)    Cancer (HCC)    Melanoma on Neck    2008   Carotid artery occlusion    CHF (congestive heart failure) (HCC)    Diabetes mellitus    type 2   ED (erectile dysfunction)    GERD (gastroesophageal reflux disease)    Hemorrhagic stroke (HCC) 06/2021   Hyperlipidemia    Hypertension    Lung cancer (HCC)    Neoplasm related pain    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)    Stroke [facility])     Surgical History: Past Surgical History:  Procedure Laterality Date   ANTERIOR CERVICAL DECOMP/DISCECTOMY FUSION N/A 02/09/2023   Procedure: C3-5 ANTERIOR CERVICAL DISCECTOMY AND FUSION;  Surgeon: Venetia Night, MD;  Location: [facility] ORS;  Service: Neurosurgery;  Laterality: N/A;   BRONCHIAL NEEDLE  ASPIRATION BIOPSY  06/17/2021   Procedure: BRONCHIAL NEEDLE ASPIRATION BIOPSIES;  Surgeon: Lorin Glass, MD;  Location: [facility];  Service: Pulmonary;;   IR IMAGING GUIDED PORT INSERTION  08/05/2021   MELANOMA EXCISION  2008   Left side of neck   RADIOLOGY WITH ANESTHESIA N/A 04/05/2020   Procedure: MRI SPINE WITOUT CONTRAST;  Surgeon: Radiologist, Medication, MD;  Location: [facility] OR;  Service: Radiology;  Laterality: N/A;   RADIOLOGY WITH ANESTHESIA N/A 08/22/2021   Procedure: MRI BRAIN WITH AND WITHOUT CONTRAST  WITH ANESTHESIA;  Surgeon: Radiologist, Medication, MD;  Location: [facility] OR;  Service: Radiology;  Laterality: N/A;   RADIOLOGY WITH ANESTHESIA N/A 12/17/2021   Procedure: MRI BRAIN WITH AND WITHOUT CONTRAST WITH ANESTHESIA; MRI LUMBER WITH AND WITHOUT CONTRAST;  Surgeon: Radiologist, Medication, MD;  Location: [facility] OR;  Service: Radiology;  Laterality: N/A;   RADIOLOGY WITH ANESTHESIA N/A 04/08/2022   Procedure: MRI LUMBER SPINE WITH AND WITHOUT CONTRAST;  Surgeon: Radiologist, Medication, MD;  Location: [facility] OR;  Service: Radiology;  Laterality: N/A;   TONSILLECTOMY     VIDEO BRONCHOSCOPY WITH ENDOBRONCHIAL ULTRASOUND N/A 06/17/2021   Procedure: VIDEO BRONCHOSCOPY WITH ENDOBRONCHIAL ULTRASOUND;  Surgeon: Lorin Glass, MD;  Location: [facility] ENDOSCOPY;  Service: Pulmonary;  Laterality: N/A;    Home Medications:  Allergies as of 03/26/2023       Reactions   Niaspan [niacin]    FLUSHING        Medication  List        Accurate as of March 26, 2023  3:02 PM. If you have any questions, ask your nurse or doctor.          ALPRAZolam 0.5 MG tablet Commonly known as: XANAX Take 1 tablet (0.5 mg total) by mouth at bedtime as needed for anxiety.   B-D UF III MINI PEN NEEDLES 31G X 5 MM Misc Generic drug: Insulin Pen Needle Use as directed   celecoxib 200 MG capsule Commonly known as: CeleBREX Take 1 capsule (200 mg total) by mouth 2 (two) times daily.   citalopram 10 MG  tablet Commonly known as: CELEXA Take 1 tablet (10 mg total) by mouth daily.   docusate sodium 100 MG capsule Commonly known as: COLACE Take 100 mg by mouth 2 (two) times daily as needed for mild constipation.   fentaNYL 75 MCG/HR Commonly known as: DURAGESIC Place 1 patch onto the skin every 3 (three) days.   finasteride 5 MG tablet Commonly known as: PROSCAR Take 1 tablet (5 mg total) by mouth daily.   gabapentin 300 MG capsule Commonly known as: NEURONTIN Take 2 capsules (600 mg total) by mouth 2 (two) times daily.   Gerhardt's butt cream Crea Apply 14 Applications topically 2 (two) times daily.   insulin glargine 100 UNIT/ML injection Commonly known as: LANTUS Inject 0-30 Units into the skin daily as needed (High Blood Sugar over 150).   levETIRAcetam 500 MG tablet Commonly known as: KEPPRA Take 1 tablet (500 mg total) by mouth 2 (two) times daily.   megestrol 40 MG tablet Commonly known as: MEGACE Take 1 tablet (40 mg total) by mouth daily.   methocarbamol 500 MG tablet Commonly known as: ROBAXIN Take 1 tablet (500 mg total) by mouth every 6 (six) hours as needed for muscle spasms.   NovoLOG FlexPen 100 UNIT/ML FlexPen Generic drug: insulin aspart Inject 0-10 Units into the skin daily as needed for high blood sugar (BG > 200).   omeprazole 20 MG capsule Commonly known as: PRILOSEC Take 20 mg by mouth daily.   oxyCODONE-acetaminophen 7.5-325 MG tablet Commonly known as: Percocet Take 1 tablet by mouth every 4 (four) hours as needed for severe pain (pain score 7-10). Do not take with cough medication or other pain medication   potassium chloride SA 20 MEQ tablet Commonly known as: KLOR-CON M Take 1 tablet (20 mEq total) by mouth daily.   senna 8.6 MG Tabs tablet Commonly known as: SENOKOT Take 1 tablet (8.6 mg total) by mouth 2 (two) times daily as needed for mild constipation.   tamsulosin 0.4 MG Caps capsule Commonly known as: FLOMAX Take 1 capsule  (0.4 mg total) by mouth daily. Started by: Riki Altes   valsartan 80 MG tablet Commonly known as: DIOVAN Take 80 mg by mouth daily.        Allergies:  Allergies  Allergen Reactions   Niaspan [Niacin]     FLUSHING    Family History: Family History  Problem Relation Age of Onset   COPD Mother    Heart disease Father    Heart disease Brother        MI at age 54   Stroke Neg Hx     Social History:  reports that he quit smoking about 32 years ago. His smoking use included cigarettes. He has never used smokeless tobacco. He reports that he does not drink alcohol and does not use drugs.   Physical Exam: BP 137/71  Pulse 80   Ht 5\' 7"  (1.702 m)   Wt 132 lb (59.9 kg)   BMI 20.67 kg/m   Constitutional:  Alert, No acute distress. HEENT: [place] AT Respiratory: Normal respiratory effort, no increased work of breathing. Psychiatric: Normal mood and affect.  Urinalysis Dipstick trace blood/nitrate positive/2+ leukocytes, microscopy >30 WBC/3-10 RBC.    Pertinent Imaging: CT was personally reviewed and interpreted.   CT  EXAM: CT CHEST, ABDOMEN AND PELVIS WITHOUT CONTRAST   TECHNIQUE: Multidetector CT imaging of the chest, abdomen and pelvis was performed following the standard protocol without IV contrast.   RADIATION DOSE REDUCTION: This exam was performed according to the departmental dose-optimization program which includes automated exposure control, adjustment of the mA and/or kV according to patient size and/or use of iterative reconstruction technique.   COMPARISON:  CTA chest 10/01/2022. Postcontrast CT 12/19/2022. PET-CT scan 07/03/2022. Chest abdomen pelvis CT 04/28/2022.   FINDINGS: CT CHEST FINDINGS   Cardiovascular: Right upper chest port in place with tip seen extending to the central SVC. The heart is nonenlarged. Small pericardial effusion. Coronary artery calcifications are seen. The thoracic aorta has a normal course and caliber with mild  calcified atherosclerotic plaque.   Mediastinum/Nodes: Small thyroid gland. Slightly patulous thoracic esophagus. On this non IV contrast exam, overall there is no specific abnormal lymph node enlargement seen in the axillary regions, hilum or mediastinum.   Lungs/Pleura: Is some linear opacity in the right lung base likely scar or atelectasis. No pleural effusion. Right lung is without consolidation, pneumothorax or effusion. No dominant right-sided lung nodule.   There is some volume loss along left hemithorax. There is some bandlike areas of opacity along the left lower lobe likely scar or atelectasis. Along the posterior aspect of the lingula previously with a lobular mass measuring 2.3 x 1.8 cm on the study of October 2024. This has a area today has a more surrounding opacity extending out to the pleura with some thickening. This does obscure the confines of the mass but when measured in the same fashion as on the prior area measures a proximally 3.4 by 2.4 cm, larger. The peripheral areas of consolidative opacity along the left lower lobe is decreasing with some residual on axial series 4 image 88. Few small rounded ground-glass areas dependently in the left lower lobe are noted more caudal. This has increased.   Musculoskeletal: There is some curvature of the spine with some degenerative changes. Unilateral left rib fracture. Stable.   CT ABDOMEN PELVIS FINDINGS   Hepatobiliary: With the limits of non IV contrast, grossly preserved hepatic parenchyma. Gallbladder is nondilated.   Pancreas: Moderate fatty atrophy of the pancreas. No obvious mass on this noncontrast study.   Spleen: Normal in size without focal abnormality.   Adrenals/Urinary Tract: Adrenal glands are preserved. There is some contrast in the renal collecting systems bilaterally. Please correlate with previous postcontrast MRI of the head. No collecting system dilatation. Mild atrophy of the left kidney.  There is a low-attenuation lesion along the anterior aspect of the left kidney measuring 20 mm with Hounsfield unit of 7, Bosniak 1 cyst and unchanged from prior. Preserved contours of the urinary bladder.   Stomach/Bowel: Moderate colonic stool. The large bowel overall has a normal course and caliber. The stomach and small bowel are nondilated as well. The stomach is relatively collapsed.   Vascular/Lymphatic: Aortic atherosclerosis. No enlarged abdominal or pelvic lymph nodes.   Reproductive: Prostate is unremarkable.   Other: Mild anasarca.  No free  air or free fluid.   Musculoskeletal: Osteopenia degenerative changes. There are some sclerotic bone lesions identified once again such as the iliac bones, thoracolumbar spine. Distribution appears similar to previous. Posttraumatic deformity of the right transverse process also seen at L4.   IMPRESSION: Increasing opacity along the lingula with the increasing nodular central component compared to the recent CT scan.   There is also some changing opacities in the left lower lobe. The larger more peripheral area in the left lower lobe is decreasing with some new areas of subtle ground-glass more caudal. Attention on follow-up.   No developing new other mass lesion or nodal enlargement.   Scattered sclerotic bone lesions has a similar distribution to previous.   Overall evaluation for solid organ pathology including solid organ metastases are limited without the advantage of IV contrast.     Electronically Signed   By: Karen Kays M.D.   On: 03/06/2023 14:33    Assessment & Plan:    1. Pyuria Positive urine cultures October 2024, however 2 subsequent urine cultures growing multiple species. UA today with pyuria and repeat urine culture ordered.   2. Lower urinary tract symptoms PVR 86 mL Add tamsulosin 0.4 mg daily.  Schedule cystoscopy approximately 1 month.  I have reviewed the above documentation for accuracy  and completeness, and I agree with the above.   Riki Altes, MD  [facility] [address] [place], [place] [address] [phone]

## 2023-03-30 LAB — CULTURE, URINE COMPREHENSIVE

## 2023-03-31 ENCOUNTER — Inpatient Hospital Stay: Payer: PPO

## 2023-03-31 ENCOUNTER — Encounter: Payer: Self-pay | Admitting: Oncology

## 2023-03-31 ENCOUNTER — Inpatient Hospital Stay (HOSPITAL_BASED_OUTPATIENT_CLINIC_OR_DEPARTMENT_OTHER): Payer: PPO | Admitting: Oncology

## 2023-03-31 VITALS — BP 117/63 | HR 85 | Temp 98.5°F | Resp 18 | Wt 126.7 lb

## 2023-03-31 VITALS — BP 108/57 | HR 75

## 2023-03-31 DIAGNOSIS — I619 Nontraumatic intracerebral hemorrhage, unspecified: Secondary | ICD-10-CM | POA: Diagnosis not present

## 2023-03-31 DIAGNOSIS — C3412 Malignant neoplasm of upper lobe, left bronchus or lung: Secondary | ICD-10-CM

## 2023-03-31 DIAGNOSIS — G8929 Other chronic pain: Secondary | ICD-10-CM

## 2023-03-31 DIAGNOSIS — G629 Polyneuropathy, unspecified: Secondary | ICD-10-CM | POA: Diagnosis not present

## 2023-03-31 DIAGNOSIS — Z5112 Encounter for antineoplastic immunotherapy: Secondary | ICD-10-CM | POA: Diagnosis not present

## 2023-03-31 DIAGNOSIS — M549 Dorsalgia, unspecified: Secondary | ICD-10-CM | POA: Diagnosis not present

## 2023-03-31 DIAGNOSIS — C7951 Secondary malignant neoplasm of bone: Secondary | ICD-10-CM | POA: Diagnosis not present

## 2023-03-31 LAB — CBC WITH DIFFERENTIAL/PLATELET
Abs Immature Granulocytes: 0.03 10*3/uL (ref 0.00–0.07)
Basophils Absolute: 0 10*3/uL (ref 0.0–0.1)
Basophils Relative: 1 %
Eosinophils Absolute: 0.3 10*3/uL (ref 0.0–0.5)
Eosinophils Relative: 5 %
HCT: 36.5 % — ABNORMAL LOW (ref 39.0–52.0)
Hemoglobin: 11.9 g/dL — ABNORMAL LOW (ref 13.0–17.0)
Immature Granulocytes: 1 %
Lymphocytes Relative: 10 %
Lymphs Abs: 0.6 10*3/uL — ABNORMAL LOW (ref 0.7–4.0)
MCH: 30.1 pg (ref 26.0–34.0)
MCHC: 32.6 g/dL (ref 30.0–36.0)
MCV: 92.4 fL (ref 80.0–100.0)
Monocytes Absolute: 0.5 10*3/uL (ref 0.1–1.0)
Monocytes Relative: 8 %
Neutro Abs: 4.6 10*3/uL (ref 1.7–7.7)
Neutrophils Relative %: 75 %
Platelets: 233 10*3/uL (ref 150–400)
RBC: 3.95 MIL/uL — ABNORMAL LOW (ref 4.22–5.81)
RDW: 12.8 % (ref 11.5–15.5)
WBC: 6.1 10*3/uL (ref 4.0–10.5)
nRBC: 0 % (ref 0.0–0.2)

## 2023-03-31 LAB — COMPREHENSIVE METABOLIC PANEL
ALT: 10 U/L (ref 0–44)
AST: 18 U/L (ref 15–41)
Albumin: 3.9 g/dL (ref 3.5–5.0)
Alkaline Phosphatase: 59 U/L (ref 38–126)
Anion gap: 10 (ref 5–15)
BUN: 22 mg/dL (ref 8–23)
CO2: 24 mmol/L (ref 22–32)
Calcium: 9.2 mg/dL (ref 8.9–10.3)
Chloride: 101 mmol/L (ref 98–111)
Creatinine, Ser: 1.01 mg/dL (ref 0.61–1.24)
GFR, Estimated: >60 mL/min (ref >60)
Glucose, Bld: 116 mg/dL — ABNORMAL HIGH (ref 70–99)
Potassium: 4 mmol/L (ref 3.5–5.1)
Sodium: 135 mmol/L (ref 135–145)
Total Bilirubin: 0.9 mg/dL (ref 0.0–1.2)
Total Protein: 7 g/dL (ref 6.5–8.1)

## 2023-03-31 LAB — TSH: TSH: 1.709 u[IU]/mL (ref 0.350–4.500)

## 2023-03-31 MED ORDER — HEPARIN SOD (PORK) LOCK FLUSH 100 UNIT/ML IV SOLN
500.0000 [IU] | Freq: Once | INTRAVENOUS | Status: AC | PRN
  Administered 2023-03-31: 500 [IU]
  Filled 2023-03-31: qty 5

## 2023-03-31 MED ORDER — SODIUM CHLORIDE 0.9 % IV SOLN
200.0000 mg | Freq: Once | INTRAVENOUS | Status: AC
  Administered 2023-03-31: 200 mg via INTRAVENOUS
  Filled 2023-03-31: qty 200

## 2023-03-31 MED ORDER — SODIUM CHLORIDE 0.9 % IV SOLN
Freq: Once | INTRAVENOUS | Status: AC
  Filled 2023-03-31: qty 250

## 2023-03-31 NOTE — Assessment & Plan Note (Signed)
Fingertip neuropathy intermittent Grade 1, feet neuropathy persistent, Grade 2.  On Gabapentin 300mg  TID

## 2023-03-31 NOTE — Assessment & Plan Note (Signed)
Previously on Zometa every 4- 6 weeks.-  hold off today due to back decompression surgery [02/09/2023]. Continue calcium supplementation

## 2023-03-31 NOTE — Assessment & Plan Note (Signed)
Immunotherapy plan as listed above

## 2023-03-31 NOTE — Progress Notes (Signed)
Hematology/Oncology Progress note [phone] [phone]       CHIEF COMPLAINTS/REASON FOR VISIT:  Metastatic non-small cell lung cancer  ASSESSMENT & PLAN:   Cancer Staging  Primary non-small cell carcinoma of upper lobe of left lung (HCC) Staging form: Lung, AJCC 8th Edition - Clinical: Stage IV (cT2b, cN1, cM1) - Signed by [name], MD on 08/06/2021   Primary non-small cell carcinoma of upper lobe of left lung (HCC) Stage IV lung adenocarcinoma with brain and bone metastasis.  S/p lung radiation to left lung.  January 2025, CT and PET scan results were reviewed. Stable disease. No progression or new disease.  Labs are reviewed and discussed with patient. I recommend to continue Keytruda   Encounter for antineoplastic immunotherapy Immunotherapy plan as listed above  Intraparenchymal hemorrhage of brain (HCC) Hold off Eliquis per neurology Dr. Barbaraann Cao 10/02/22 Repeat MRI brain-stable findings Repeat MRI brain in Jan 2025-result is negaive   Metastasis to bone [facility]) Previously on Zometa every 4- 6 weeks.-  hold off today due to back decompression surgery [02/09/2023]. Continue calcium supplementation  Neuropathy Fingertip neuropathy intermittent Grade 1, feet neuropathy persistent, Grade 2.  On Gabapentin 300mg  TID     Back pain Back pain, MRI lumbar showed DJD, L4-L5 synovial cyst,PET scan showed L4 fracture Continue PRN percocet 7.5mg /325mg , fentanyl patch Q72 hours      Follow up 3 weeks All questions were answered. The patient knows to call the clinic with any problems, questions or concerns.  [name], MD, [name] [facility] Hematology Oncology 03/31/2023      HISTORY OF PRESENTING ILLNESS:   Micheal Donovan is a  73 y.o.  male presents for follow up of Non-small cell lung cancer.  Oncology History Overview Note  Diagnosis: Stage IIB T2b N1 M0 adenocarcinoma of the LUL, poorly differentiated     Primary non-small cell carcinoma  of upper lobe of left lung (HCC)  06/13/2021 Initial Diagnosis   Primary non-small cell carcinoma of upper lobe of left lung [facility]) -06/20/2021 - 06/27/2021, patient presented to [facility] due to progressive headache/dizziness/gait changes. CT head showed acute to subacute intraparenchymal hemorrhage involving the left cerebellum.  Surrounding low-density vasogenic edema.  Patient was transferred to [facility]. This was further evaluated by CT angiogram of the neck which showed bulky calcified plaques/stenosis of carotid artery, Followed by MRI brain. 06/12/2021, MRI of the brain showed no significant interval change in size of the left cerebellar intraparenchymal hematoma with unchanged regional mass effect and partial effacement of fourth ventricle but no upstream hydrocephalus. There is no discernible enhancement to suggest underlying mass lesion, though acute blood products could mask enhancement.   06/12/2021 a chest x-ray showed a 4.4 cm left middle lobe. 06/13/2021, CT chest with contrast showed a 4.3 x 3.6 x 3.3 cm lobular spiculated mass in the posterior left upper lobe with T3 to the lateral pleura and major fissure.  Metastatic left hilar lymphadenopathy.  Peripheral micronodularity posterior right costophrenic sulcus.  Aortic atherosclerosis. 06/14/2021, CT abdomen pelvis showed right lower lobe pulmonary artery embolus.  No evidence of right heart strain.  No acute intra-abdominal or pelvic pathology.  Aortic atherosclerosis.  06/13/2021, patient underwent bronchoscopy with EBUS by Dr. Katrinka Blazing.  Biopsy from the fine-needle aspiration of station 11 mL showed malignant cells, consistent with poorly differentiated non-small cell carcinoma, consistent with adenocarcinoma.  Malignant cells are TTF-1 positive and negative for p40.  Negative for neuroendocrine markers.  Blood test and tissue sample were  tested for Gardant 360  -PD-L1 TPS 97%, no actionable mutation on the blood testing. Tissue  molecular testing showed [id number] mutation.   07/17/2021 Cancer Staging   Staging form: Lung, AJCC 8th Edition - Clinical: Stage IV (cT2b, cN1, cM1) - Signed by [name], MD on 08/06/2021   08/06/2021 Imaging   I saw patient's PET scan after his visit with me on 08/06/21. PET scan was ordered by his previous oncologist group and did not come to my in basket.. Patient received cycle 1 carboplatin Taxol today.  He has started on radiation.  5/26/3 PET scan showed 3.8 cm hypermetabolic left upper lobe mass with hypermetabolic left hilar and infrahilar adenopathy.  There is approximately 8 scattered metastatic lesions in the skeleton. Mixed density photopenic lesion in the left cerebellum. characterized as hemorrhage on recent prior imaging workups.    08/16/2021 -  Chemotherapy   Patient is on Treatment Plan : LUNG Carboplatin (5) + Pemetrexed + Pembrolizumab (200) D1 q21d Induction x 4 cycles / Maintenance Pemetrexed + Pembrolizumab (200) D1 q21d      08/22/2021 Imaging   MRI brain w wo contrast  Decrease in size of left cerebellar hematoma with resolution of edema. No evidence of underlying lesion. Smaller, more recent hemorrhage in the left cerebellar vermis with minimal edema. There is minimal enhancement without definite evidence of underlying lesion.  New punctate focus of chronic blood products and enhancement in the left frontal lobe. Additional new foci of chronic blood products in the posterior right putamen, right parietal subcortical white matter, and posterior right cerebellum. Unclear at this time if these represent foci of bland hemorrhage or early metastases.   Increase in size of right parietal osseous metastasis with minor extraosseous extension.     Imaging   PET scan showed 1. Interval response to therapy as evidenced by a small residual left upper lobe nodule with decreased hypermetabolism, no residual hypermetabolic adenopathy and decreased hypermetabolism associatedwith  osseous metastases. 2. 6 mm posterior left upper lobe nodule, likely stable. Recommend attention on follow-up. 3. Aortic atherosclerosis (ICD10-I70.0). Coronary artery calcification.   12/17/2021 Imaging   MRI lumbar spine w wo contrast  1. No evidence of regional metastatic disease. 2. Late subacute superior endplate fracture at L2 and large superior endplate Schmorl's node at L5, unchanged since the PET scan of 10/29/2021. 3. L3-4: Shallow disc protrusion. Facet and ligamentous hypertrophy. Stenosis of both lateral recesses. Findings slightly worsened since 2022. 4. L4-5: Shallow disc protrusion. Facet and ligamentous hypertrophy. Small synovial cyst arising from the facet joint on the right. Stenosis of the lateral recesses right worse than left. Foraminal narrowing right worse than left. Findings have worsened since 2022.  5. L5-S1: Endplate osteophytes and shallow protrusion of the disc. Facet and ligamentous hypertrophy. Stenosis of the subarticular lateral recesses and neural foramina, right worse than left. Similar appearance to the study of 2022. 6. Continued evidence of enteritis of at least 1 loop of small bowel. Distended bladder present   12/18/2021 Imaging   Brian MRI w wo  1. Interval decrease in size of the left cerebellar and vermian hematomas. No definite evidence of underlying metastatic lesion. 2. There is are two possible contrast enhancing lesions in the posterior left frontal lobe and left occipital lobe, which do not have intrinsic T1 signal abnormality, but have a somewhat linear appearance and may be vascular in nature. Recommend attention on follow up. 3. Multifocal sites of susceptibility artifact are redemonstrated with interval development of a few  new foci, as described above. All of these sites demonstrate intrinsic T1 hyperintense signal abnormality and susceptibility artifact and are compatible with sites of microhemorrhages. Recommend continued attention on  follow up. 4. Interval decrease in size of right parietal calvarial metastatic lesion. No new contrast enhancing lesions visualized. 5. Diffusely heterogeneous marrow signal throughout the cervical spine, which is nonspecific but can be seen in the setting of anemia, smoking, obesity, or a marrow replacement process.     12/18/2021 Imaging   MRI lumbar spine w wo contrast  1. No evidence of regional metastatic disease. 2. Late subacute superior endplate fracture at L2 and large superior endplate Schmorl's node at L5, unchanged since the PET scan of 10/29/2021 3. L3-4: Shallow disc protrusion. Facet and ligamentous hypertrophy.Stenosis of both lateral recesses. Findings slightly worsened since 2022. 4. L4-5: Shallow disc protrusion. Facet and ligamentous hypertrophy. Small synovial cyst arising from the facet joint on the right. Stenosis of the lateral recesses right worse than left. Foraminal narrowing right worse than left. Findings have worsened since 2022. 5. L5-S1: Endplate osteophytes and shallow protrusion of the disc. Facet and ligamentous hypertrophy. Stenosis of the subarticular lateral recesses and neural foramina, right worse than left. Similar appearance to the study of 2022. 6. Continued evidence of enteritis of at least 1 loop of small bowel. Distended bladder present.       01/28/2022 Imaging   CT chest abdomen pelvis w contrast 1. Treated left upper lobe mass appears grossly stable to the prior examination when measured in a similar fashion on the prior study. No definite signs of extra skeletal metastatic disease noted in the chest, abdomen or pelvis. 2. Widespread skeletal metastases redemonstrated, as above. 3. Hepatic steatosis. 4. Aortic atherosclerosis, in addition to left main and three-vessel coronary artery disease. Assessment for potential risk factor modification, dietary therapy or pharmacologic therapy may be warranted, if clinically indicated. 5. Additional  incidental findings,    04/08/2022 Imaging   MRI lumbar spine w wo contrast  1. Stable remote superior endplate fracture of L2 and large Schmorl to noted involving L5. No worrisome bone lesions to suggest metastatic disease. 2. Degenerative lumbar spondylosis with multilevel disc disease and facet disease which appears relatively stable as detailed above. 3. Enlarging right-sided synovial cyst at L4-5 with progressive mass effect on the right side of the thecal sac. This could potentially be symptomatic   04/28/2022 Imaging   CT chest abdomen pelvis wo contrast  1. Similar versus slightly decreased size of treated left upper lobe primary bronchogenic carcinoma. 2. Similar osseous metastasis. 3. No evidence of extraosseous metastatic disease. 4. New small right pleural effusion. 5. Coronary artery atherosclerosis. Aortic Atherosclerosis   07/03/2022 Imaging   PET scan showed 1. Interval development of a small focus of intense hypermetabolism within the left upper lobe scar. This is associated with a 5 mm perifissural nodule in the left lung which is hypermetabolic. Hypermetabolism at both of these locations is new since prior PET-CT and highly suspicious for recurrent disease. 2. No evidence for hypermetabolic soft tissue metastatic disease in the neck, abdomen, or pelvis. 3. Similar appearance of sclerotic and lytic bone lesions without hypermetabolism on PET imaging. 4. Focal hypermetabolism associated with a fracture of the right L4 transverse process. The fracture was visible on the previous CT of 04/28/2022. No associated soft tissue lesion to suggest that this is pathologic in nature. Tracer accumulation on today's study is in keeping with healing fracture. There is also some minimal uptake in a  prominent spur associated with the L4 superior endplate, most likely degenerative. 5.  Aortic Atherosclerosis    08/01/2022 - 08/14/2022 Radiation Therapy   Radiation to left lung.    10/01/2022  Imaging   chest angiogram PE protocol  No pulmonary embolism identified.   Stable nodular opacity in the left upper lobe. Please correlate for history of treated lung cancer. There is an adjacent small nodule along the course of the interlobar fissure which is slightly larger today compared to the study of February 2024. please correlate with findings of the recent PET-CT of 07/03/2022.      10/02/2022 Imaging   MRI brain with and without contrast 1. No acute intracranial abnormality. No evidence for intraparenchymal metastatic disease. 2. Chronic left cerebellar hemorrhage with multiple additional chronic micro hemorrhages elsewhere throughout the brain, stable. 3. Underlying mild chronic microvascular ischemic disease.    10/02/2022 Imaging   MRI lumbar spine showed 1. No MRI evidence for acute infection or other abnormality within the lumbar spine. 2. Few subcentimeter T1 hypointense lesions about the visualized posterior iliac wings. These correspond with small sclerotic foci seen on most recent CT from 04/08/2022, presumably reflecting patient's known osseous metastatic disease. These were not hypermetabolic on prior PET-CT from 98/01/9146. 3. No other evidence for metastatic disease within the lumbar spine itself. 4. Chronic L2 compression fracture, stable. Large Schmorl's node deformity with associated height loss at L5, also stable. 5. Multifactorial degenerative changes at L3-4 through L5-S1 with resultant mild to moderate bilateral subarticular stenosis, with mild to moderate bilateral L3 through L5 foraminal narrowing as above.   12/24/2022 Imaging   CT chest w contrast   1. Stable appearance of treated lung lesion within the left upper lobe with surrounding scarring and architectural distortion. 2. The previous FDG avid subpleural nodule abutting the oblique fissure of the left lung (adjacent to post treatment changes) is again noted measuring 7 mm. This is stable from  10/01/2022. On the PET-CT from 07/03/2022 this nodule was measured at 5 mm. 3. New bandlike area of subpleural consolidation within the lateral and posterior left lower lobe is identified. This is favored to represent post treatment change. 4. Stable sclerotic lesions involving the T12 and L1 vertebra. 5. Coronary artery calcifications. 6.  Aortic Atherosclerosis    03/06/2023 Imaging   CT chest abdomen pelvis wo contrast showed  Increasing opacity along the lingula with the increasing nodular central component compared to the recent CT scan.   There is also some changing opacities in the left lower lobe. The larger more peripheral area in the left lower lobe is decreasing with some new areas of subtle ground-glass more caudal. Attention on follow-up.   No developing new other mass lesion or nodal enlargement.   Scattered sclerotic bone lesions has a similar distribution to previous.   Overall evaluation for solid organ pathology including solid organ metastases are limited without the advantage of IV contrast.     03/23/2023 Imaging   PET scan showed 1. Post radiation consolidation in the lingula with mild residual focal hypermetabolism, overall improved from 07/03/2022. No evidence of disease progression. 2. Quiescent osseous metastatic disease. 3. Aortic atherosclerosis (ICD10-I70.0). Coronary artery calcification.   # Patient has a history of melanoma on his neck, treated in 2009. # History of hemorrhagic infarct left cerebellum  10/01/2022 - 10/06/2022 patient was admitted to the hospital due to lower extremity weakness and intermittent aphasia. Patient reports acute on chronic left lower extremity weakness, weakness has been more prominent over  the past 2 weeks.  He reports significant episode of difficulty with ambulation 2 days after his last Keytruda treatments.  He reports that he felt loss of control of muscle at that time.  Patient reports that the weakness is different  than her chronic lower extremity  weakness.  Patient was found to have orthostatic hypotension Flomax irbesartan were held.  CK wnl, Neurology was consulted and started the patient on Keppra for possible seizure.  EEG was negative.  01/31/23 He recently presented to ER due to altered mental status and shaking episodes.  CT head wo negative. UA positive for nitrate and leukocyte esterase. Code sepsis was actived in ER and he was recommended for treatment of UTI.  He declined admission and went home with Keflex 500mg  4 times daily for 10 days.  INTERVAL HISTORY Micheal Donovan is a 73 y.o. male who has above history reviewed by me today presents for follow up visit for management of  Stage IV lung adenocarcinoma Accompanied by care give.   + back pain, and left lower extremity weakness due to pain, on fentanyl patch Q72 hours and percocet Lower extremity weakness localizes best to lumbosacral nerve roots, known spondylosis with multiple foci of neuro-foraminal stenosis.  02/09/2023 S/p decompression neurosurgery   He has establish care with urology and there is plan for cystoscopy.    Review of Systems  Constitutional:  Positive for appetite change, fatigue and unexpected weight change. Negative for chills and fever.  HENT:   Negative for hearing loss and voice change.   Eyes:  Negative for eye problems and icterus.  Respiratory:  Positive for shortness of breath. Negative for chest tightness and cough.   Cardiovascular:  Positive for leg swelling. Negative for chest pain.  Gastrointestinal:  Negative for abdominal distention, abdominal pain and blood in stool.  Endocrine: Negative for hot flashes.  Genitourinary:  Negative for difficulty urinating, dysuria and frequency.   Musculoskeletal:  Positive for back pain. Negative for arthralgias.  Skin:  Negative for itching and rash.  Neurological:  Negative for extremity weakness, headaches, light-headedness and numbness.  Hematological:   Negative for adenopathy. Does not bruise/bleed easily.  Psychiatric/Behavioral:  Positive for sleep disturbance. Negative for confusion.     MEDICAL HISTORY:  Past Medical History:  Diagnosis Date   Allergy    Anxiety    BPH (benign prostatic hypertrophy)    Cancer (HCC)    Melanoma on Neck    2008   Carotid artery occlusion    CHF (congestive heart failure) (HCC)    Diabetes mellitus    type 2   ED (erectile dysfunction)    GERD (gastroesophageal reflux disease)    Hemorrhagic stroke (HCC) 06/2021   Hyperlipidemia    Hypertension    Lung cancer (HCC)    Neoplasm related pain    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)    Stroke [facility])     SURGICAL HISTORY: Past Surgical History:  Procedure Laterality Date   ANTERIOR CERVICAL DECOMP/DISCECTOMY FUSION N/A 02/09/2023   Procedure: C3-5 ANTERIOR CERVICAL DISCECTOMY AND FUSION;  Surgeon: Venetia Night, MD;  Location: [facility] ORS;  Service: Neurosurgery;  Laterality: N/A;   BRONCHIAL NEEDLE ASPIRATION BIOPSY  06/17/2021   Procedure: BRONCHIAL NEEDLE ASPIRATION BIOPSIES;  Surgeon: Lorin Glass, MD;  Location: [facility] ENDOSCOPY;  Service: Pulmonary;;   IR IMAGING GUIDED PORT INSERTION  08/05/2021   MELANOMA EXCISION  2008   Left side of neck   RADIOLOGY WITH ANESTHESIA N/A 04/05/2020  Procedure: MRI SPINE WITOUT CONTRAST;  Surgeon: Radiologist, Medication, MD;  Location: [facility] OR;  Service: Radiology;  Laterality: N/A;   RADIOLOGY WITH ANESTHESIA N/A 08/22/2021   Procedure: MRI BRAIN WITH AND WITHOUT CONTRAST  WITH ANESTHESIA;  Surgeon: Radiologist, Medication, MD;  Location: [facility] OR;  Service: Radiology;  Laterality: N/A;   RADIOLOGY WITH ANESTHESIA N/A 12/17/2021   Procedure: MRI BRAIN WITH AND WITHOUT CONTRAST WITH ANESTHESIA; MRI LUMBER WITH AND WITHOUT CONTRAST;  Surgeon: Radiologist, Medication, MD;  Location: [facility] OR;  Service: Radiology;  Laterality: N/A;   RADIOLOGY WITH ANESTHESIA N/A 04/08/2022   Procedure: MRI LUMBER SPINE WITH AND  WITHOUT CONTRAST;  Surgeon: Radiologist, Medication, MD;  Location: [facility] OR;  Service: Radiology;  Laterality: N/A;   TONSILLECTOMY     VIDEO BRONCHOSCOPY WITH ENDOBRONCHIAL ULTRASOUND N/A 06/17/2021   Procedure: VIDEO BRONCHOSCOPY WITH ENDOBRONCHIAL ULTRASOUND;  Surgeon: Lorin Glass, MD;  Location: [facility] ENDOSCOPY;  Service: Pulmonary;  Laterality: N/A;    SOCIAL HISTORY: Social History   Socioeconomic History   Marital status: Married    Spouse name: Not on file   Number of children: Not on file   Years of education: Not on file   Highest education level: Not on file  Occupational History   Not on file  Tobacco Use   Smoking status: Former    Current packs/day: 0.00    Types: Cigarettes    Quit date: 07/21/1990    Years since quitting: 32.7   Smokeless tobacco: Never  Vaping Use   Vaping status: Never Used  Substance and Sexual Activity   Alcohol use: No   Drug use: No   Sexual activity: Not Currently  Other Topics Concern   Not on file  Social History Narrative   Not on file   Social Drivers of Health   Financial Resource Strain: Low Risk  (04/17/2022)   Overall Financial Resource Strain (CARDIA)    Difficulty of Paying Living Expenses: Not hard at all  Food Insecurity: No Food Insecurity (02/09/2023)   Hunger Vital Sign    Worried About Running Out of Food in the Last Year: Never true    Ran Out of Food in the Last Year: Never true  Transportation Needs: No Transportation Needs (02/09/2023)   PRAPARE - Administrator, Civil Service (Medical): No    Lack of Transportation (Non-Medical): No  Physical Activity: Inactive (03/27/2022)   Exercise Vital Sign    Days of Exercise per Week: 0 days    Minutes of Exercise per Session: 0 min  Stress: Stress Concern Present (03/27/2022)   [employer] of Occupational Health - Occupational Stress Questionnaire    Feeling of Stress : To some extent  Social Connections: Unknown (11/17/2022)   Received from [facility]   Social Network    Social Network: Not on file  Intimate Partner Violence: Not At Risk (02/09/2023)   Humiliation, Afraid, Rape, and Kick questionnaire    Fear of Current or Ex-Partner: No    Emotionally Abused: No    Physically Abused: No    Sexually Abused: No    FAMILY HISTORY: Family History  Problem Relation Age of Onset   COPD Mother    Heart disease Father    Heart disease Brother        MI at age 18   Stroke Neg Hx     ALLERGIES:  is allergic to niaspan [niacin].  MEDICATIONS:  Current Outpatient Medications  Medication Sig Dispense Refill  ALPRAZolam (XANAX) 0.5 MG tablet Take 1 tablet (0.5 mg total) by mouth at bedtime as needed for anxiety. 15 tablet 0   celecoxib (CELEBREX) 200 MG capsule Take 1 capsule (200 mg total) by mouth 2 (two) times daily. 60 capsule 0   citalopram (CELEXA) 10 MG tablet Take 1 tablet (10 mg total) by mouth daily. 30 tablet 3   docusate sodium (COLACE) 100 MG capsule Take 100 mg by mouth 2 (two) times daily as needed for mild constipation.     fentaNYL (DURAGESIC) 75 MCG/HR Place 1 patch onto the skin every 3 (three) days. 10 patch 0   finasteride (PROSCAR) 5 MG tablet Take 1 tablet (5 mg total) by mouth daily. 30 tablet 0   gabapentin (NEURONTIN) 300 MG capsule Take 2 capsules (600 mg total) by mouth 2 (two) times daily. 120 capsule 2   insulin glargine (LANTUS) 100 UNIT/ML injection Inject 0-30 Units into the skin daily as needed (High Blood Sugar over 150).     Insulin Pen Needle (B-D UF III MINI PEN NEEDLES) 31G X 5 MM MISC Use as directed 100 each 3   levETIRAcetam (KEPPRA) 500 MG tablet Take 1 tablet (500 mg total) by mouth 2 (two) times daily. 60 tablet 5   megestrol (MEGACE) 40 MG tablet Take 1 tablet (40 mg total) by mouth daily. 30 tablet 3   methocarbamol (ROBAXIN) 500 MG tablet Take 1 tablet (500 mg total) by mouth every 6 (six) hours as needed for muscle spasms. 120 tablet 0   NOVOLOG FLEXPEN 100 UNIT/ML FlexPen Inject  0-10 Units into the skin daily as needed for high blood sugar (BG > 200).     Nystatin (GERHARDT'S BUTT CREAM) CREA Apply 14 Applications topically 2 (two) times daily. 1 each 0   omeprazole (PRILOSEC) 20 MG capsule Take 20 mg by mouth daily.     oxyCODONE-acetaminophen (PERCOCET) 7.5-325 MG tablet Take 1 tablet by mouth every 4 (four) hours as needed for severe pain (pain score 7-10). Do not take with cough medication or other pain medication 60 tablet 0   potassium chloride SA (KLOR-CON M) 20 MEQ tablet Take 1 tablet (20 mEq total) by mouth daily. 3 tablet 0   senna (SENOKOT) 8.6 MG TABS tablet Take 1 tablet (8.6 mg total) by mouth 2 (two) times daily as needed for mild constipation. 30 tablet 0   tamsulosin (FLOMAX) 0.4 MG CAPS capsule Take 1 capsule (0.4 mg total) by mouth daily. 30 capsule 2   valsartan (DIOVAN) 80 MG tablet Take 80 mg by mouth daily.     No current facility-administered medications for this visit.   Facility-Administered Medications Ordered in Other Visits  Medication Dose Route Frequency Provider Last Rate Last Admin   heparin lock flush 100 UNIT/ML injection              PHYSICAL EXAMINATION:  Vitals:   03/31/23 1322  BP: 117/63  Pulse: 85  Resp: 18  Temp: 98.5 F (36.9 C)  SpO2: 97%   Filed Weights   03/31/23 1322  Weight: 126 lb 11.2 oz (57.5 kg)    Physical Exam Constitutional:      General: He is not in acute distress.    Appearance: He is ill-appearing.  HENT:     Head: Normocephalic and atraumatic.  Eyes:     General: No scleral icterus. Cardiovascular:     Rate and Rhythm: Normal rate and regular rhythm.     Heart sounds: Normal heart sounds.  Pulmonary:     Effort: Pulmonary effort is normal. No respiratory distress.     Breath sounds: No wheezing.     Comments: Decreased breath sound bilaterally. Abdominal:     General: Bowel sounds are normal. There is no distension.     Palpations: Abdomen is soft.  Musculoskeletal:         General: No deformity. Normal range of motion.     Cervical back: Normal range of motion and neck supple.     Comments: Bilateral ankle trace edema.  Skin:    General: Skin is warm and dry.     Findings: No rash.  Neurological:     Mental Status: He is alert. Mental status is at baseline.     Cranial Nerves: No cranial nerve deficit.     Comments: Chronic left-sided weakness  Psychiatric:        Mood and Affect: Mood normal.     LABORATORY DATA:  I have reviewed the data as listed    Latest Ref Rng & Units 03/31/2023    1:15 PM 03/10/2023   12:56 PM 02/17/2023    1:01 PM  CBC  WBC 4.0 - 10.5 K/uL 6.1  6.5  8.4   Hemoglobin 13.0 - 17.0 g/dL 60.4  54.0  98.1   Hematocrit 39.0 - 52.0 % 36.5  34.9  34.5   Platelets 150 - 400 K/uL 233  219  359       Latest Ref Rng & Units 03/31/2023    1:15 PM 03/10/2023   12:56 PM 02/17/2023    1:01 PM  CMP  Glucose 70 - 99 mg/dL 191  478  295   BUN 8 - 23 mg/dL 22  19  23    Creatinine 0.61 - 1.24 mg/dL 6.21  3.08  6.57   Sodium 135 - 145 mmol/L 135  137  136   Potassium 3.5 - 5.1 mmol/L 4.0  3.8  4.7   Chloride 98 - 111 mmol/L 101  102  105   CO2 22 - 32 mmol/L 24  26  23    Calcium 8.9 - 10.3 mg/dL 9.2  8.9  9.1   Total Protein 6.5 - 8.1 g/dL 7.0  6.6  7.1   Total Bilirubin 0.0 - 1.2 mg/dL 0.9  0.4  0.5   Alkaline Phos 38 - 126 U/L 59  65  60   AST 15 - 41 U/L 18  14  13    ALT 0 - 44 U/L 10  9  11       Iron/TIBC/Ferritin/ %Sat    Component Value Date/Time   IRON 60 02/03/2022 0958   TIBC 391 02/03/2022 0958   FERRITIN 704 (H) 02/03/2022 0958   IRONPCTSAT 15 (L) 02/03/2022 8469      RADIOGRAPHIC STUDIES: I have personally reviewed the radiological images as listed and agreed with the findings in the report. DG Cervical Spine 2 or 3 views Result Date: 03/27/2023 CLINICAL DATA:  Status post cervical fusion.  Cervical myelopathy. EXAM: CERVICAL SPINE - 2-3 VIEW COMPARISON:  Operative imaging, 02/09/2023. MR cervical spine,  01/09/2023. FINDINGS: Alvino Chapel fracture or bone lesion.  No spondylolisthesis. Anterior cervical spine fusion, C3 through C5. Well-positioned anterior fusion plate with well-seated fixation screws. Intervertebral disc cages are well centered at C3-C4 and C4-C5. No evidence of hardware loosening. Mature bony fusion at C5-C6. Mild to moderate loss of disc height with endplate spurring at C6-C7. Soft tissues are unremarkable. IMPRESSION: 1. Anterior cervical disc fusion C3 through  C5. No evidence of a hardware complication. 2. No fracture or acute finding. Electronically Signed   By: Amie Portland M.D.   On: 03/27/2023 10:09   NM PET Image Restag (PS) Skull Base To Thigh Result Date: 03/23/2023 CLINICAL DATA:  Subsequent treatment strategy for lung cancer. EXAM: NUCLEAR MEDICINE PET SKULL BASE TO THIGH TECHNIQUE: 6.2 mCi F-18 FDG was injected intravenously. Full-ring PET imaging was performed from the skull base to thigh after the radiotracer. CT data was obtained and used for attenuation correction and anatomic localization. Fasting blood glucose: 71 mg/dl COMPARISON:  CT chest abdomen pelvis 02/24/2023, PET 07/03/2022. FINDINGS: Mediastinal blood pool activity: SUV max 2.2 Liver activity: SUV max NA NECK: No abnormal hypermetabolism. 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 CHEST: Consolidation in the lingula with minimal residual associated focal hypermetabolism, SUV max 3.6, compared to 7.4 previously. No additional abnormal hypermetabolism. Incidental CT findings: Right IJ Port-A-Cath terminates at the SVC RA junction. Atherosclerotic calcification of the aorta, aortic valve and coronary arteries. Heart is enlarged. No pericardial or pleural effusion. ABDOMEN/PELVIS: No abnormal hypermetabolism. Incidental CT findings: Small low-attenuation lesion in the left kidney. No specific follow-up necessary. SKELETON: No suspicious abnormal hypermetabolism.  Specifically, scattered sclerotic osseous lesions do not show abnormal hypermetabolism. Index mixed lytic and sclerotic lesion in the right iliac wing, SUV max 2.0, below blood pool. Incidental CT findings: Osteopenia. Degenerative changes in the spine. Old left rib fracture. IMPRESSION: 1. Post radiation consolidation in the lingula with mild residual focal hypermetabolism, overall improved from 07/03/2022. No evidence of disease progression. 2. Quiescent osseous metastatic disease. 3. Aortic atherosclerosis (ICD10-I70.0). Coronary artery calcification. Electronically Signed   By: Leanna Battles M.D.   On: 03/23/2023 09:10

## 2023-03-31 NOTE — Assessment & Plan Note (Signed)
Back pain, MRI lumbar showed DJD, L4-L5 synovial cyst,PET scan showed L4 fracture Continue PRN percocet 7.5mg /325mg , fentanyl patch Q72 hours

## 2023-03-31 NOTE — Assessment & Plan Note (Signed)
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  S/p lung radiation to left lung.  January 2025, CT and PET scan results were reviewed. Stable disease. No progression or new disease.  Labs are reviewed and discussed with patient. I recommend to continue [facility]

## 2023-03-31 NOTE — Patient Instructions (Signed)
[facility] - A DEPT OF [facility]  Discharge Instructions: Thank you for choosing [facility] to provide your oncology and hematology care.  If you have a lab appointment with the Cancer Center, please go directly to the Cancer Center and check in at the registration area.  Wear comfortable clothing and clothing appropriate for easy access to any Portacath or PICC line.   We strive to give you quality time with your provider. You may need to reschedule your appointment if you arrive late (15 or more minutes).  Arriving late affects you and other patients whose appointments are after yours.  Also, if you miss three or more appointments without notifying the office, you may be dismissed from the clinic at the provider's discretion.      For prescription refill requests, have your pharmacy contact our office and allow 72 hours for refills to be completed.    Today you received the following chemotherapy and/or immunotherapy agents- Keytruda      To help prevent nausea and vomiting after your treatment, we encourage you to take your nausea medication as directed.  BELOW ARE SYMPTOMS THAT SHOULD BE REPORTED IMMEDIATELY: *FEVER GREATER THAN 100.4 F (38 C) OR HIGHER *CHILLS OR SWEATING *NAUSEA AND VOMITING THAT IS NOT CONTROLLED WITH YOUR NAUSEA MEDICATION *UNUSUAL SHORTNESS OF BREATH *UNUSUAL BRUISING OR BLEEDING *URINARY PROBLEMS (pain or burning when urinating, or frequent urination) *BOWEL PROBLEMS (unusual diarrhea, constipation, pain near the anus) TENDERNESS IN MOUTH AND THROAT WITH OR WITHOUT PRESENCE OF ULCERS (sore throat, sores in mouth, or a toothache) UNUSUAL RASH, SWELLING OR PAIN  UNUSUAL VAGINAL DISCHARGE OR ITCHING   Items with * indicate a potential emergency and should be followed up as soon as possible or go to the Emergency Department if any problems should occur.  Please show the CHEMOTHERAPY ALERT CARD or IMMUNOTHERAPY  ALERT CARD at check-in to the Emergency Department and triage nurse.  Should you have questions after your visit or need to cancel or reschedule your appointment, please contact [facility] - A DEPT OF [facility]  [phone] and follow the prompts.  Office hours are 8:00 a.m. to 4:30 p.m. Monday - Friday. Please note that voicemails left after 4:00 p.m. may not be returned until the following business day.  We are closed weekends and major holidays. You have access to a nurse at all times for urgent questions. Please call the main number to the clinic [phone] and follow the prompts.  For any non-urgent questions, you may also contact your provider using MyChart. We now offer e-Visits for anyone 55 and older to request care online for non-urgent symptoms. For details visit [email or web address].   Also download the MyChart app! Go to the app store, search "MyChart", open the app, select [facility], and log in with your MyChart username and password.

## 2023-03-31 NOTE — Assessment & Plan Note (Signed)
Hold off Eliquis per neurology Dr. Barbaraann Cao 10/02/22 Repeat MRI brain-stable findings Repeat MRI brain in Jan 2025-result is negaive

## 2023-04-01 ENCOUNTER — Other Ambulatory Visit: Payer: Self-pay | Admitting: *Deleted

## 2023-04-01 ENCOUNTER — Ambulatory Visit: Payer: Self-pay | Admitting: Urology

## 2023-04-01 ENCOUNTER — Encounter: Payer: Self-pay | Admitting: *Deleted

## 2023-04-01 LAB — T4: T4, Total: 8.5 ug/dL (ref 4.5–12.0)

## 2023-04-01 MED ORDER — CEFDINIR 300 MG PO CAPS: 300.0000 mg | ORAL_CAPSULE | Freq: Two times a day (BID) | ORAL | 0 refills | Status: AC

## 2023-04-08 ENCOUNTER — Other Ambulatory Visit: Payer: Self-pay

## 2023-04-08 DIAGNOSIS — E1165 Type 2 diabetes mellitus with hyperglycemia: Secondary | ICD-10-CM

## 2023-04-08 MED ORDER — BD PEN NEEDLE MINI U/F 31G X 5 MM MISC: 3 refills | Status: DC

## 2023-04-16 ENCOUNTER — Other Ambulatory Visit: Payer: PPO

## 2023-04-16 ENCOUNTER — Other Ambulatory Visit: Payer: Self-pay

## 2023-04-16 ENCOUNTER — Ambulatory Visit: Payer: PPO | Admitting: Neurosurgery

## 2023-04-16 VITALS — BP 128/82 | Ht 67.0 in | Wt 126.0 lb

## 2023-04-16 DIAGNOSIS — Z09 Encounter for follow-up examination after completed treatment for conditions other than malignant neoplasm: Secondary | ICD-10-CM

## 2023-04-16 DIAGNOSIS — G894 Chronic pain syndrome: Secondary | ICD-10-CM

## 2023-04-16 DIAGNOSIS — R399 Unspecified symptoms and signs involving the genitourinary system: Secondary | ICD-10-CM

## 2023-04-16 DIAGNOSIS — R8281 Pyuria: Secondary | ICD-10-CM

## 2023-04-16 DIAGNOSIS — G959 Disease of spinal cord, unspecified: Secondary | ICD-10-CM

## 2023-04-16 LAB — MICROSCOPIC EXAMINATION
Epithelial Cells (non renal): >10 /[HPF] — AB (ref 0–10)
WBC, UA: >30 /[HPF] — AB (ref 0–5)

## 2023-04-16 LAB — URINALYSIS, COMPLETE
Bilirubin, UA: NEGATIVE
Glucose, UA: NEGATIVE
Ketones, UA: NEGATIVE
Nitrite, UA: POSITIVE — AB
Specific Gravity, UA: 1.015 (ref 1.005–1.030)
Urobilinogen, Ur: 0.2 mg/dL (ref 0.2–1.0)
pH, UA: 6.5 (ref 5.0–7.5)

## 2023-04-16 NOTE — Progress Notes (Signed)
   REFERRING PHYSICIAN:  Donita Brooks, Md [address] [place],  [place] [address]  DOS: 02/09/23 C3-5 ACDF  HISTORY OF PRESENT ILLNESS: Micheal Donovan is status post ACDF for cervical stenosis and myelopathy.  He has seen some improvement in his strength.  He is having a significant issue with walking due to his pain.  His cancer is stable.   PHYSICAL EXAMINATION:  NEUROLOGICAL:  General: In no acute distress.   Awake, alert, oriented to person, place, and time.  Pupils equal round and reactive to light.  Facial tone is symmetric.  Tongue protrusion is midline.    Strength: Side Biceps Triceps Deltoid Interossei Grip Wrist Ext. Wrist Flex.  R 5 5 5 5 5 5 5   L 5 5 5 5 5 5 5    He has 4-5 strength in his bilateral iliopsoas but 5 out of 5 elsewhere.  He feels slightly weaker in his left leg.  Incision c/d/I and healing well  Imaging:  No complications noted.  Some settling at C4-5 as expected.  Assessment / Plan: AMEDEO DETWEILER is doing well after recent ACDF.  He has shown improvement in his neurologic function.  His chronic pain syndrome is currently limiting him.  I think he is a good candidate for spinal cord stimulation.  I will refer him for psychology evaluation and to Dr. Cherylann Ratel for a trial.   Venetia Night MD Dept of Neurosurgery

## 2023-04-16 NOTE — Patient Instructions (Signed)
[facility] - televisits [phone] Not in network with [facility] - televisits [phone] Not in network with [facility] in [place] Provider: Jacelyn Pi, [place] [phone] Accepts [facility] Dr Kieth Brightly in [place] [phone] Accepts [facility], but is typically booked out about 10 months Dr Orie Fisherman in [place] - also does televisits [phone] Dr Irish Lack in [place] [phone] Dr Mindi Slicker in [place] - also does televisits [phone] [facility] (offices in [place], [place], and [place]) [phone]   Please let us know which one of the above psychologist's you would like to see for evaluation prior to the spinal cord stimulator trial. These are the only providers we are aware of that perform this type of evaluation. Once we fax the referral, please call them to set up an appointment (they do not typically call you).

## 2023-04-17 ENCOUNTER — Ambulatory Visit: Payer: Self-pay | Admitting: Family Medicine

## 2023-04-17 ENCOUNTER — Emergency Department: Payer: PPO

## 2023-04-17 ENCOUNTER — Other Ambulatory Visit: Payer: Self-pay

## 2023-04-17 ENCOUNTER — Inpatient Hospital Stay
Admission: EM | Admit: 2023-04-17 | Discharge: 2023-04-21 | DRG: 101 | Disposition: A | Discharge status: Home Health | Payer: PPO | Attending: Student | Admitting: Student

## 2023-04-17 ENCOUNTER — Telehealth: Payer: Self-pay

## 2023-04-17 DIAGNOSIS — F418 Other specified anxiety disorders: Secondary | ICD-10-CM | POA: Diagnosis not present

## 2023-04-17 DIAGNOSIS — C3412 Malignant neoplasm of upper lobe, left bronchus or lung: Secondary | ICD-10-CM | POA: Diagnosis not present

## 2023-04-17 DIAGNOSIS — I5032 Chronic diastolic (congestive) heart failure: Secondary | ICD-10-CM | POA: Diagnosis not present

## 2023-04-17 DIAGNOSIS — N3000 Acute cystitis without hematuria: Secondary | ICD-10-CM

## 2023-04-17 DIAGNOSIS — F419 Anxiety disorder, unspecified: Secondary | ICD-10-CM | POA: Diagnosis present

## 2023-04-17 DIAGNOSIS — E785 Hyperlipidemia, unspecified: Secondary | ICD-10-CM | POA: Diagnosis present

## 2023-04-17 DIAGNOSIS — K219 Gastro-esophageal reflux disease without esophagitis: Secondary | ICD-10-CM | POA: Diagnosis present

## 2023-04-17 DIAGNOSIS — R569 Unspecified convulsions: Secondary | ICD-10-CM

## 2023-04-17 DIAGNOSIS — Z981 Arthrodesis status: Secondary | ICD-10-CM

## 2023-04-17 DIAGNOSIS — F32A Depression, unspecified: Secondary | ICD-10-CM | POA: Diagnosis present

## 2023-04-17 DIAGNOSIS — E119 Type 2 diabetes mellitus without complications: Secondary | ICD-10-CM | POA: Diagnosis not present

## 2023-04-17 DIAGNOSIS — I10 Essential (primary) hypertension: Secondary | ICD-10-CM | POA: Diagnosis not present

## 2023-04-17 DIAGNOSIS — I452 Bifascicular block: Secondary | ICD-10-CM | POA: Diagnosis present

## 2023-04-17 DIAGNOSIS — C3490 Malignant neoplasm of unspecified part of unspecified bronchus or lung: Secondary | ICD-10-CM | POA: Diagnosis not present

## 2023-04-17 DIAGNOSIS — T465X6A Underdosing of other antihypertensive drugs, initial encounter: Secondary | ICD-10-CM | POA: Diagnosis present

## 2023-04-17 DIAGNOSIS — Z794 Long term (current) use of insulin: Secondary | ICD-10-CM | POA: Diagnosis not present

## 2023-04-17 DIAGNOSIS — B965 Pseudomonas (aeruginosa) (mallei) (pseudomallei) as the cause of diseases classified elsewhere: Secondary | ICD-10-CM | POA: Diagnosis present

## 2023-04-17 DIAGNOSIS — G40909 Epilepsy, unspecified, not intractable, without status epilepticus: Secondary | ICD-10-CM

## 2023-04-17 DIAGNOSIS — E11319 Type 2 diabetes mellitus with unspecified diabetic retinopathy without macular edema: Secondary | ICD-10-CM | POA: Diagnosis present

## 2023-04-17 DIAGNOSIS — N401 Enlarged prostate with lower urinary tract symptoms: Secondary | ICD-10-CM

## 2023-04-17 DIAGNOSIS — N390 Urinary tract infection, site not specified: Secondary | ICD-10-CM | POA: Diagnosis present

## 2023-04-17 DIAGNOSIS — Z87891 Personal history of nicotine dependence: Secondary | ICD-10-CM

## 2023-04-17 DIAGNOSIS — Z85118 Personal history of other malignant neoplasm of bronchus and lung: Secondary | ICD-10-CM

## 2023-04-17 DIAGNOSIS — R4182 Altered mental status, unspecified: Secondary | ICD-10-CM | POA: Diagnosis not present

## 2023-04-17 DIAGNOSIS — Z79899 Other long term (current) drug therapy: Secondary | ICD-10-CM

## 2023-04-17 DIAGNOSIS — Z79891 Long term (current) use of opiate analgesic: Secondary | ICD-10-CM

## 2023-04-17 DIAGNOSIS — Z85820 Personal history of malignant melanoma of skin: Secondary | ICD-10-CM

## 2023-04-17 DIAGNOSIS — T426X6A Underdosing of other antiepileptic and sedative-hypnotic drugs, initial encounter: Secondary | ICD-10-CM | POA: Diagnosis present

## 2023-04-17 DIAGNOSIS — G319 Degenerative disease of nervous system, unspecified: Secondary | ICD-10-CM | POA: Diagnosis not present

## 2023-04-17 DIAGNOSIS — N400 Enlarged prostate without lower urinary tract symptoms: Secondary | ICD-10-CM | POA: Diagnosis present

## 2023-04-17 DIAGNOSIS — G40A09 Absence epileptic syndrome, not intractable, without status epilepticus: Principal | ICD-10-CM | POA: Diagnosis present

## 2023-04-17 DIAGNOSIS — E11649 Type 2 diabetes mellitus with hypoglycemia without coma: Secondary | ICD-10-CM | POA: Diagnosis not present

## 2023-04-17 DIAGNOSIS — L89152 Pressure ulcer of sacral region, stage 2: Secondary | ICD-10-CM | POA: Diagnosis present

## 2023-04-17 DIAGNOSIS — Z8249 Family history of ischemic heart disease and other diseases of the circulatory system: Secondary | ICD-10-CM

## 2023-04-17 DIAGNOSIS — I110 Hypertensive heart disease with heart failure: Secondary | ICD-10-CM | POA: Diagnosis present

## 2023-04-17 DIAGNOSIS — M62838 Other muscle spasm: Secondary | ICD-10-CM | POA: Diagnosis not present

## 2023-04-17 DIAGNOSIS — G894 Chronic pain syndrome: Secondary | ICD-10-CM

## 2023-04-17 DIAGNOSIS — Z993 Dependence on wheelchair: Secondary | ICD-10-CM

## 2023-04-17 DIAGNOSIS — Z825 Family history of asthma and other chronic lower respiratory diseases: Secondary | ICD-10-CM

## 2023-04-17 DIAGNOSIS — Z91148 Patient's other noncompliance with medication regimen for other reason: Secondary | ICD-10-CM

## 2023-04-17 DIAGNOSIS — R253 Fasciculation: Secondary | ICD-10-CM | POA: Diagnosis not present

## 2023-04-17 DIAGNOSIS — Z8673 Personal history of transient ischemic attack (TIA), and cerebral infarction without residual deficits: Secondary | ICD-10-CM

## 2023-04-17 LAB — CBC WITH DIFFERENTIAL/PLATELET
Abs Immature Granulocytes: 0.03 10*3/uL (ref 0.00–0.07)
Basophils Absolute: 0 10*3/uL (ref 0.0–0.1)
Basophils Relative: 0 %
Eosinophils Absolute: 0.2 10*3/uL (ref 0.0–0.5)
Eosinophils Relative: 3 %
HCT: 37.6 % — ABNORMAL LOW (ref 39.0–52.0)
Hemoglobin: 12.2 g/dL — ABNORMAL LOW (ref 13.0–17.0)
Immature Granulocytes: 0 %
Lymphocytes Relative: 9 %
Lymphs Abs: 0.7 10*3/uL (ref 0.7–4.0)
MCH: 29.8 pg (ref 26.0–34.0)
MCHC: 32.4 g/dL (ref 30.0–36.0)
MCV: 91.9 fL (ref 80.0–100.0)
Monocytes Absolute: 0.6 10*3/uL (ref 0.1–1.0)
Monocytes Relative: 8 %
Neutro Abs: 5.8 10*3/uL (ref 1.7–7.7)
Neutrophils Relative %: 80 %
Platelets: 242 10*3/uL (ref 150–400)
RBC: 4.09 MIL/uL — ABNORMAL LOW (ref 4.22–5.81)
RDW: 12.8 % (ref 11.5–15.5)
WBC: 7.3 10*3/uL (ref 4.0–10.5)
nRBC: 0 % (ref 0.0–0.2)

## 2023-04-17 LAB — URINALYSIS, W/ REFLEX TO CULTURE (INFECTION SUSPECTED)
Bilirubin Urine: NEGATIVE
Glucose, UA: 50 mg/dL — AB
Ketones, ur: NEGATIVE mg/dL
Nitrite: POSITIVE — AB
Protein, ur: NEGATIVE mg/dL
Specific Gravity, Urine: 1.013 (ref 1.005–1.030)
WBC, UA: >50 WBC/hpf (ref 0–5)
pH: 5 (ref 5.0–8.0)

## 2023-04-17 LAB — COMPREHENSIVE METABOLIC PANEL
ALT: 9 U/L (ref 0–44)
AST: 17 U/L (ref 15–41)
Albumin: 3.9 g/dL (ref 3.5–5.0)
Alkaline Phosphatase: 64 U/L (ref 38–126)
Anion gap: 12 (ref 5–15)
BUN: 16 mg/dL (ref 8–23)
CO2: 22 mmol/L (ref 22–32)
Calcium: 9.3 mg/dL (ref 8.9–10.3)
Chloride: 103 mmol/L (ref 98–111)
Creatinine, Ser: 0.95 mg/dL (ref 0.61–1.24)
GFR, Estimated: >60 mL/min (ref >60)
Glucose, Bld: 193 mg/dL — ABNORMAL HIGH (ref 70–99)
Potassium: 3.8 mmol/L (ref 3.5–5.1)
Sodium: 137 mmol/L (ref 135–145)
Total Bilirubin: 0.7 mg/dL (ref 0.0–1.2)
Total Protein: 6.9 g/dL (ref 6.5–8.1)

## 2023-04-17 LAB — CBG MONITORING, ED: Glucose-Capillary: 90 mg/dL (ref 70–99)

## 2023-04-17 LAB — MAGNESIUM: Magnesium: 2.3 mg/dL (ref 1.7–2.4)

## 2023-04-17 LAB — BRAIN NATRIURETIC PEPTIDE: B Natriuretic Peptide: 245.2 pg/mL — ABNORMAL HIGH (ref 0.0–100.0)

## 2023-04-17 LAB — PHOSPHORUS: Phosphorus: 2.7 mg/dL (ref 2.5–4.6)

## 2023-04-17 LAB — CK: Total CK: 72 U/L (ref 49–397)

## 2023-04-17 MED ORDER — INSULIN ASPART 100 UNIT/ML IJ SOLN: 0.0000 [IU] | Freq: Every day | INTRAMUSCULAR | Status: DC

## 2023-04-17 MED ORDER — SODIUM CHLORIDE 0.9 % IV SOLN
1.0000 g | INTRAVENOUS | Status: DC
  Administered 2023-04-17 – 2023-04-19 (×3): 1 g via INTRAVENOUS
  Filled 2023-04-17 (×3): qty 10

## 2023-04-17 MED ORDER — LORAZEPAM 2 MG/ML IJ SOLN
0.5000 mg | Freq: Once | INTRAMUSCULAR | Status: AC
  Administered 2023-04-17: 0.5 mg via INTRAVENOUS
  Filled 2023-04-17: qty 1

## 2023-04-17 MED ORDER — LORAZEPAM 2 MG/ML IJ SOLN
1.0000 mg | Freq: Once | INTRAMUSCULAR | Status: AC
  Administered 2023-04-17: 1 mg via INTRAVENOUS
  Filled 2023-04-17: qty 1

## 2023-04-17 MED ORDER — HYDRALAZINE HCL 20 MG/ML IJ SOLN: 5.0000 mg | INTRAMUSCULAR | Status: DC | PRN

## 2023-04-17 MED ORDER — LORAZEPAM 2 MG/ML IJ SOLN
2.0000 mg | INTRAMUSCULAR | Status: DC | PRN
  Administered 2023-04-17: 2 mg via INTRAVENOUS
  Filled 2023-04-17: qty 1

## 2023-04-17 MED ORDER — LEVETIRACETAM 500 MG PO TABS: 500.0000 mg | ORAL_TABLET | Freq: Two times a day (BID) | ORAL | Status: DC

## 2023-04-17 MED ORDER — LORAZEPAM 1 MG PO TABS
1.0000 mg | ORAL_TABLET | Freq: Once | ORAL | Status: AC
  Administered 2023-04-17: 1 mg via ORAL
  Filled 2023-04-17: qty 1

## 2023-04-17 MED ORDER — ONDANSETRON HCL 4 MG/2ML IJ SOLN: 4.0000 mg | Freq: Three times a day (TID) | INTRAMUSCULAR | Status: DC | PRN

## 2023-04-17 MED ORDER — ACETAMINOPHEN 325 MG PO TABS: 650.0000 mg | ORAL_TABLET | Freq: Four times a day (QID) | ORAL | Status: DC | PRN

## 2023-04-17 MED ORDER — INSULIN ASPART 100 UNIT/ML IJ SOLN
0.0000 [IU] | Freq: Three times a day (TID) | INTRAMUSCULAR | Status: DC
  Administered 2023-04-20 – 2023-04-21 (×2): 1 [IU] via SUBCUTANEOUS
  Filled 2023-04-17 (×2): qty 1

## 2023-04-17 MED ORDER — LEVETIRACETAM IN NACL 1000 MG/100ML IV SOLN
1000.0000 mg | Freq: Once | INTRAVENOUS | Status: AC
  Administered 2023-04-17: 1000 mg via INTRAVENOUS
  Filled 2023-04-17: qty 100

## 2023-04-17 MED ORDER — GADOBUTROL 1 MMOL/ML IV SOLN
5.0000 mL | Freq: Once | INTRAVENOUS | Status: DC | PRN
  Administered 2023-04-17: 1 mL via INTRAVENOUS

## 2023-04-17 NOTE — ED Notes (Signed)
Per MD give 1 mg now and another 0.5mg  when MRI comes.

## 2023-04-17 NOTE — ED Triage Notes (Addendum)
Pt c/o right leg and arm "twitch" that comes and goes since this morning. Pt denies weakness, numbness, tingling, pain. Pt is AOX4, NAD noted. Pt has a history of seizures.  Pt took robaxin, gabapentin, megestrol, and citalopram as ordered today.

## 2023-04-17 NOTE — Telephone Encounter (Signed)
PT order has been placed.

## 2023-04-17 NOTE — Telephone Encounter (Signed)
He is asking for a referral for physical therapy for his back issues. Are you ok with me placing this order?

## 2023-04-17 NOTE — Addendum Note (Signed)
Addended by: Ernie Hew on: 04/17/2023 03:25 PM   Modules accepted: Orders

## 2023-04-17 NOTE — ED Provider Notes (Signed)
[facility] Provider Note    Event Date/Time   First MD Initiated Contact with Patient 04/17/23 1603     (approximate)  History   Chief Complaint: Jerking/twitching of right arm and leg  HPI  Micheal Donovan is a 73 y.o. male with a past medical history of anxiety, diabetes, CHF, gastric reflux, hypertension, hyperlipidemia, prior hemorrhagic brain lesion, non-small cell lung cancer, history of a seizure disorder which the patient states is absence seizures, presents to the emergency department for new onset of right arm and leg jerking/twitching.  According to the patient since this morning he has been experiencing jerking in the right arm and right leg which is involuntary.  Patient had several these episodes during my evaluation in the right leg where it will jerk involuntarily.  Good strength and sensation otherwise.  Patient states this is never occurred previously.    Physical Exam   Triage Vital Signs: ED Triage Vitals  Encounter Vitals Group     BP 04/17/23 1348 (!) 129/49     Systolic BP Percentile --      Diastolic BP Percentile --      Pulse Rate 04/17/23 1348 71     Resp 04/17/23 1348 17     Temp 04/17/23 1348 98.8 F (37.1 C)     Temp Source 04/17/23 1348 Oral     SpO2 04/17/23 1348 96 %     Weight 04/17/23 1636 125 lb 14.1 oz (57.1 kg)     Height 04/17/23 1636 5\' 7"  (1.702 m)     Head Circumference --      Peak Flow --      Pain Score 04/17/23 1349 0     Pain Loc --      Pain Education --      Exclude from Growth Chart --     Most recent vital signs: Vitals:   04/17/23 1348  BP: (!) 129/49  Pulse: 71  Resp: 17  Temp: 98.8 F (37.1 C)  SpO2: 96%    General: Awake, no distress.  CV:  Good peripheral perfusion.  Regular rate and rhythm  Resp:  Normal effort.  Equal breath sounds bilaterally.  Abd:  No distention.  Soft, nontender.  No rebound or guarding. Other:  Occasional jerking of the right leg during exam.   ED Results /  Procedures / Treatments    MEDICATIONS ORDERED IN ED: Medications  LORazepam (ATIVAN) injection 1 mg (has no administration in time range)  LORazepam (ATIVAN) injection 0.5 mg (has no administration in time range)     IMPRESSION / MDM / ASSESSMENT AND PLAN / ED COURSE  I reviewed the triage vital signs and the nursing notes.  Patient's presentation is most consistent with acute presentation with potential threat to life or bodily function.  Patient presents the emergency department for involuntary right arm and right leg jerking that has been occurring since this morning.  Patient has a history of lung cancer, no known brain metastases.  I reviewed the patient's chart he last had an MRI this past December that was negative for any acute abnormality.  He also has a history of a prior brain bleed.  Given the patient's new onset of involuntary right arm and leg movements with a history of lung cancer we will proceed with a repeat MRI with without contrast to rule out any metastatic disease, tumor, mass.  We will treat with 1 mg of IV Ativan to see if this helps with  the patient symptoms for possible partial seizure although his symptoms occur randomly and are very brief in duration.  Lab work is reassuring with a normal CBC, normal chemistry, normal CK.  I have reviewed the MRI images.  My evaluation patient has significant artifact from motion.  Radiology has read this as severe motion limited study but no obvious acute abnormality.  Patient is more confused now however this is probably more related to the Ativan that the patient received prior to MRI.  Patient's workup today otherwise shows a reassuring CBC, reassuring chemistry and a normal CK.  I spoke to the hospitalist who will be admitting to his service, I suspect the patient would likely benefit from neurology consultation once they are available tomorrow and possible EEG if deemed necessary.  Daughter agreeable to plan of care to admit and  have neurology evaluate for  FINAL CLINICAL IMPRESSION(S) / ED DIAGNOSES   Involuntary muscle jerking    Note:  This document was prepared using Dragon voice recognition software and may include unintentional dictation errors.   Minna Antis, MD 04/17/23 2133

## 2023-04-17 NOTE — Telephone Encounter (Signed)
He notified me that he doesn't want [facility]

## 2023-04-17 NOTE — H&P (Addendum)
History and Physical    Micheal Donovan:[id number] DOB: April 11, 1950 DOA: 04/17/2023  Referring MD/NP/PA:   PCP: Donita Brooks, MD   Patient coming from:  The patient is coming from home.     Chief Complaint: Twitching in right leg and arm  HPI: Micheal Donovan is a 73 y.o. male with medical history significant of non-small cell lung cancer on Keytruda, absence seizure not taking medications currently, hypertension, hyperlipidemia, diabetes mellitus, diastolic CHF, stroke, depression with anxiety, BPH, melanoma, PE not on anticoagulants (finished treatment), who presents with twitching in right leg and arm.  Per her daughter and the wife at bedside, pt started having right leg and arm "twitch" that comes and goes since this morning.  No weakness or tingling in extremities.  No facial droop or slurred speech.  Patient did not have confusion or altered mental status at home, but became confused after having received 3 doses of total 2.5 mg of Ativan in ED.  No more twitching in ED.  Patient does not have chest pain, cough, SOB.  No nausea, vomiting, diarrhea or abdominal pain.  Not sure if patient has symptoms of UTI.  Of note, patient had urinalysis by his doctor yesterday which was positive for UTI.  UA on 04/16/2023 showed cloudy appearance, 3+ leukocytes, positive nitrite, many bacteria, WBC> 30.  Per his wife, patient has history of absence seizure, used to be on Keppra, but patient himself stopped taking Keppra for more than 3 months.  Data reviewed independently and ED Course: pt was found to have WBC 7.3, CK 72, GFR> 60, temperature normal, blood pressure 134/56, heart rate 71, RR 16, oxygen saturation 98% on room air.  Patient is placed on telemetry bed for observation.  MRI of brain: Severely motion limited study and incomplete study without obvious acute abnormality. Of note, postcontrast imaging was not performed and metastatic disease cannot be excluded.    EKG: I have personally  reviewed.  Sinus rhythm, QTc 499, bifascicular block   Review of Systems: Could not be reviewed due to confusion   Allergy:  Allergies  Allergen Reactions   Niaspan [Niacin]     FLUSHING    Past Medical History:  Diagnosis Date   Allergy    Anxiety    BPH (benign prostatic hypertrophy)    Cancer (HCC)    Melanoma on Neck    2008   Carotid artery occlusion    CHF (congestive heart failure) (HCC)    Diabetes mellitus    type 2   ED (erectile dysfunction)    GERD (gastroesophageal reflux disease)    Hemorrhagic stroke (HCC) 06/2021   Hyperlipidemia    Hypertension    Lung cancer (HCC)    Neoplasm related pain    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)    Stroke [facility])     Past Surgical History:  Procedure Laterality Date   ANTERIOR CERVICAL DECOMP/DISCECTOMY FUSION N/A 02/09/2023   Procedure: C3-5 ANTERIOR CERVICAL DISCECTOMY AND FUSION;  Surgeon: Venetia Night, MD;  Location: [facility] ORS;  Service: Neurosurgery;  Laterality: N/A;   BRONCHIAL NEEDLE ASPIRATION BIOPSY  06/17/2021   Procedure: BRONCHIAL NEEDLE ASPIRATION BIOPSIES;  Surgeon: Lorin Glass, MD;  Location: [facility] ENDOSCOPY;  Service: Pulmonary;;   IR IMAGING GUIDED PORT INSERTION  08/05/2021   MELANOMA EXCISION  2008   Left side of neck   RADIOLOGY WITH ANESTHESIA N/A 04/05/2020   Procedure: MRI SPINE WITOUT CONTRAST;  Surgeon: Radiologist, Medication, MD;  Location: [facility] OR;  Service: Radiology;  Laterality: N/A;   RADIOLOGY WITH ANESTHESIA N/A 08/22/2021   Procedure: MRI BRAIN WITH AND WITHOUT CONTRAST  WITH ANESTHESIA;  Surgeon: Radiologist, Medication, MD;  Location: [facility] OR;  Service: Radiology;  Laterality: N/A;   RADIOLOGY WITH ANESTHESIA N/A 12/17/2021   Procedure: MRI BRAIN WITH AND WITHOUT CONTRAST WITH ANESTHESIA; MRI LUMBER WITH AND WITHOUT CONTRAST;  Surgeon: Radiologist, Medication, MD;  Location: [facility] OR;  Service: Radiology;  Laterality: N/A;   RADIOLOGY WITH ANESTHESIA N/A 04/08/2022   Procedure: MRI LUMBER  SPINE WITH AND WITHOUT CONTRAST;  Surgeon: Radiologist, Medication, MD;  Location: [facility] OR;  Service: Radiology;  Laterality: N/A;   TONSILLECTOMY     VIDEO BRONCHOSCOPY WITH ENDOBRONCHIAL ULTRASOUND N/A 06/17/2021   Procedure: VIDEO BRONCHOSCOPY WITH ENDOBRONCHIAL ULTRASOUND;  Surgeon: Lorin Glass, MD;  Location: [facility] ENDOSCOPY;  Service: Pulmonary;  Laterality: N/A;    Social History:  reports that he quit smoking about 32 years ago. His smoking use included cigarettes. He has never used smokeless tobacco. He reports that he does not drink alcohol and does not use drugs.  Family History:  Family History  Problem Relation Age of Onset   COPD Mother    Heart disease Father    Heart disease Brother        MI at age 65   Stroke Neg Hx      Prior to Admission medications   Medication Sig Start Date End Date Taking? Authorizing Provider  ALPRAZolam Prudy Feeler) 0.5 MG tablet Take 1 tablet (0.5 mg total) by mouth at bedtime as needed for anxiety. 03/03/23   Borders, Daryl Eastern, NP  celecoxib (CELEBREX) 200 MG capsule Take 1 capsule (200 mg total) by mouth 2 (two) times daily. 02/10/23   Susanne Borders, PA  citalopram (CELEXA) 10 MG tablet Take 1 tablet (10 mg total) by mouth daily. 11/19/22   Borders, Daryl Eastern, NP  docusate sodium (COLACE) 100 MG capsule Take 100 mg by mouth 2 (two) times daily as needed for mild constipation.    [provider]  fentaNYL (DURAGESIC) 75 MCG/HR Place 1 patch onto the skin every 3 (three) days. 02/16/23   Borders, Daryl Eastern, NP  finasteride (PROSCAR) 5 MG tablet Take 1 tablet (5 mg total) by mouth daily. 10/07/22   Alford Highland, MD  gabapentin (NEURONTIN) 300 MG capsule Take 2 capsules (600 mg total) by mouth 2 (two) times daily. 12/12/22   Vaslow, Georgeanna Lea, MD  insulin glargine (LANTUS) 100 UNIT/ML injection Inject 0-30 Units into the skin daily as needed (High Blood Sugar over 150).    [provider]  Insulin Pen Needle (B-D UF III MINI PEN  NEEDLES) 31G X 5 MM MISC Use to inject insulin daily. 04/08/23   Donita Brooks, MD  levETIRAcetam (KEPPRA) 500 MG tablet Take 1 tablet (500 mg total) by mouth 2 (two) times daily. 12/12/22   Henreitta Leber, MD  megestrol (MEGACE) 40 MG tablet Take 1 tablet (40 mg total) by mouth daily. 09/22/22   Donita Brooks, MD  methocarbamol (ROBAXIN) 500 MG tablet Take 1 tablet (500 mg total) by mouth every 6 (six) hours as needed for muscle spasms. 02/10/23   Susanne Borders, PA  NOVOLOG FLEXPEN 100 UNIT/ML FlexPen Inject 0-10 Units into the skin daily as needed for high blood sugar (BG > 200).    [provider]  Nystatin (GERHARDT'S BUTT CREAM) CREA Apply 14 Applications topically 2 (two) times daily. 02/10/23   Manning Charity  Nedra Hai, PA  omeprazole (PRILOSEC) 20 MG capsule Take 20 mg by mouth daily.    [provider]  oxyCODONE-acetaminophen (PERCOCET) 7.5-325 MG tablet Take 1 tablet by mouth every 4 (four) hours as needed for severe pain (pain score 7-10). Do not take with cough medication or other pain medication 03/03/23   Borders, Daryl Eastern, NP  potassium chloride SA (KLOR-CON M) 20 MEQ tablet Take 1 tablet (20 mEq total) by mouth daily. 02/02/23   [name], MD  senna (SENOKOT) 8.6 MG TABS tablet Take 1 tablet (8.6 mg total) by mouth 2 (two) times daily as needed for mild constipation. 02/10/23   Susanne Borders, PA  tamsulosin (FLOMAX) 0.4 MG CAPS capsule Take 1 capsule (0.4 mg total) by mouth daily. 03/26/23   Stoioff, Verna Czech, MD  valsartan (DIOVAN) 80 MG tablet Take 80 mg by mouth daily. 12/08/22   [provider]  prochlorperazine (COMPAZINE) 10 MG tablet Take 1 tablet (10 mg total) by mouth every 6 (six) hours as needed (Nausea or vomiting). 07/23/21 08/06/21  [name], MD    Physical Exam: Vitals:   04/17/23 1348 04/17/23 1636 04/17/23 1719 04/17/23 2321  BP: (!) 129/49  (!) 134/56   Pulse: 71  68   Resp: 17  16   Temp: 98.8 F (37.1 C)  98.2 F (36.8 C) 98.3  F (36.8 C)  TempSrc: Oral   Oral  SpO2: 96%  98%   Weight:  57.1 kg    Height:  5\' 7"  (1.702 m)     General: Not in acute distress HEENT:       Eyes: PERRL, EOMI, no jaundice       ENT: No discharge from the ears and nose       Neck: No JVD, no bruit, no mass felt. Heme: No neck lymph node enlargement. Cardiac: S1/S2, RRR, No murmurs, No gallops or rubs. Respiratory: No rales, wheezing, rhonchi or rubs. GI: Soft, nondistended, nontender, no organomegaly, BS present. GU: No hematuria Ext: No pitting leg edema bilaterally. 1+DP/PT pulse bilaterally. Musculoskeletal: No joint deformities, No joint redness or warmth, no limitation of ROM in spin. Skin: No rashes.  Neuro: Confused, knows his own name, not orientated to the place that time, cranial nerves II-XII grossly intact, moves all extremities Psych: Patient is not psychotic, no suicidal or hemocidal ideation.  Labs on Admission: I have personally reviewed following labs and imaging studies  CBC: Recent Labs  Lab 04/17/23 1352  WBC 7.3  NEUTROABS 5.8  HGB 12.2*  HCT 37.6*  MCV 91.9  PLT 242   Basic Metabolic Panel: Recent Labs  Lab 04/17/23 1352  NA 137  K 3.8  CL 103  CO2 22  GLUCOSE 193*  BUN 16  CREATININE 0.95  CALCIUM 9.3  MG 2.3  PHOS 2.7   GFR: Estimated Creatinine Clearance: 56.8 mL/min (by C-G formula based on SCr of 0.95 mg/dL). Liver Function Tests: Recent Labs  Lab 04/17/23 1352  AST 17  ALT 9  ALKPHOS 64  BILITOT 0.7  PROT 6.9  ALBUMIN 3.9   No results for input(s): "LIPASE", "AMYLASE" in the last 168 hours. No results for input(s): "AMMONIA" in the last 168 hours. Coagulation Profile: No results for input(s): "INR", "PROTIME" in the last 168 hours. Cardiac Enzymes: Recent Labs  Lab 04/17/23 1352  CKTOTAL 72   BNP (last 3 results) No results for input(s): "PROBNP" in the last 8760 hours. HbA1C: No results for input(s): "HGBA1C" in the last  72 hours. CBG: Recent Labs  Lab  04/17/23 2202  GLUCAP 90   Lipid Profile: No results for input(s): "CHOL", "HDL", "LDLCALC", "TRIG", "CHOLHDL", "LDLDIRECT" in the last 72 hours. Thyroid Function Tests: No results for input(s): "TSH", "T4TOTAL", "FREET4", "T3FREE", "THYROIDAB" in the last 72 hours. Anemia Panel: No results for input(s): "VITAMINB12", "FOLATE", "FERRITIN", "TIBC", "IRON", "RETICCTPCT" in the last 72 hours. Urine analysis:    Component Value Date/Time   COLORURINE YELLOW (A) 04/17/2023 2154   APPEARANCEUR CLOUDY (A) 04/17/2023 2154   APPEARANCEUR Cloudy (A) 04/16/2023 1459   LABSPEC 1.013 04/17/2023 2154   PHURINE 5.0 04/17/2023 2154   GLUCOSEU 50 (A) 04/17/2023 2154   HGBUR MODERATE (A) 04/17/2023 2154   BILIRUBINUR NEGATIVE 04/17/2023 2154   BILIRUBINUR Negative 04/16/2023 1459   KETONESUR NEGATIVE 04/17/2023 2154   PROTEINUR NEGATIVE 04/17/2023 2154   NITRITE POSITIVE (A) 04/17/2023 2154   LEUKOCYTESUR LARGE (A) 04/17/2023 2154   Sepsis Labs: @LABRCNTIP (procalcitonin:4,lacticidven:4) ) Recent Results (from the past 240 hours)  Microscopic Examination     Status: Abnormal   Collection Time: 04/16/23  2:59 PM   Urine  Result Value Ref Range Status   WBC, UA >30 (A) 0 - 5 /hpf Final   RBC, Urine 3-10 (A) 0 - 2 /hpf Final   Epithelial Cells (non renal) >10 (A) 0 - 10 /hpf Final   Casts Present (A) None seen /lpf Final   Cast Type Hyaline casts N/A Final   Crystals Present (A) N/A Final   Crystal Type Amorphous Sediment N/A Final   Mucus, UA Present (A) Not Estab. Final   Bacteria, UA Many (A) None seen/Few Final     Radiological Exams on Admission:   Assessment/Plan Principal Problem:   Seizure (HCC) Active Problems:   UTI (urinary tract infection)   Primary non-small cell carcinoma of upper lobe of left lung (HCC)   Type 2 diabetes mellitus without complications (HCC)   Essential hypertension   Chronic diastolic CHF (congestive heart failure) (HCC)   BPH (benign prostatic  hyperplasia)   Depression with anxiety   Assessment and Plan:  Seizure [facility]): Patient symptoms are likely due to seizure.  Patient has history of seizure, but stopped taking Keppra for more than 3 months.  He has UTI which likely lowered threshold of seizure.  MRI of the brain is limited study, but did not show obvious abnormalities.  -Place in telemetry bed for observation -Seizure precaution -As needed Ativan for seizure -Load Keppra 1000 mg by IV -Restart Keppra 500 mg twice daily morning -As needed Robaxin -Fall precaution -Frequent neurocheck  UTI (urinary tract infection) -IV Rocephin -Repeated urine analysis, and follow-up urine culture  Primary non-small cell carcinoma of upper lobe of left lung (HCC) -Patient is on Keytruda currently -Follow-up with Dr. Cathie Hoops of oncology  Type 2 diabetes mellitus without complications [facility]): Recent A1c 6.6, well-controlled.  Patient taking NovoLog and as needed Lantus -Sliding scale insulin -Glargine insulin 5 units daily  Essential hypertension: Patient's not taking his Diovan currently per his wife.  Blood pressure 134/56 -IV hydralazine as needed  Chronic diastolic CHF (congestive heart failure) (HCC): 2D echo on 06/18/2022 showed EF 55-60% with grade 1 diastolic dysfunction.  Patient does not have leg edema or JVD.  No oxygen desaturation.  CHF seem to be compensated. -Check BNP --> 245  BPH (benign prostatic hyperplasia) -Flomax and Proscar  Depression with anxiety -Continue home medications      DVT ppx: SCD  Code Status: Full code  Family Communication:  Yes, patient's wife and daughter   at bed side.     Disposition Plan:  Anticipate discharge back to previous environment  Consults called:  none  Admission status and Level of care: Telemetry Medical:    for obs     Dispo: The patient is from: Home              Anticipated d/c is to: Home              Anticipated d/c date is: 1 day              Patient  currently is not medically stable to d/c.    Severity of Illness:  The appropriate patient status for this patient is OBSERVATION. Observation status is judged to be reasonable and necessary in order to provide the required intensity of service to ensure the patient's safety. The patient's presenting symptoms, physical exam findings, and initial radiographic and laboratory data in the context of their medical condition is felt to place them at decreased risk for further clinical deterioration. Furthermore, it is anticipated that the patient will be medically stable for discharge from the hospital within 2 midnights of admission.        Date of Service 04/18/2023    Lorretta Harp [facility]   If 7PM-7AM, please contact night-coverage [email or web address] 04/18/2023, 12:51 AM

## 2023-04-17 NOTE — Telephone Encounter (Signed)
Copied from CRM [id number]. Topic: Clinical - Red Word Triage >> Apr 17, 2023 11:42 AM Elle L wrote: Red Word that prompted transfer to Nurse Triage: The patient has a twitch that started this morning and it is causing his whole body to jerk. He is not experiencing any other symptoms but he is unsure what is causing it.   Chief Complaint: Muscle Jerks Symptoms: Right arm and shoulder arm jerky Frequency: constant Pertinent Negatives: Patient denies fever or chest pain Disposition: [x] ED /[] Urgent Care (no appt availability in office) / [] Appointment(In office/virtual)/ []  [place] [facility]/ [] Home Care/ [] Refused Recommended Disposition /[]  Mobile Bus/ []  Follow-up with PCP Additional Notes: Patient reports jerky arm and leg movement, happens every 3-4 seconds, lasting 15 secs. Pt reports history of "absent seizures", per chart review pt prescribed Keppra. Pt reports non-compliance states that he stopped taking the Keppra 5 months ago. Pt advised to go to ED. Pt agreeable sts that he will go to .   Reason for Disposition  [1] New-onset muscle jerks (twitches, spasms) AND [2] present now  Answer Assessment - Initial Assessment Questions .  Answer Assessment - Initial Assessment Questions 1. APPEARANCE of MOVEMENT: "What did the jerking or twitching look like?" (e.g., body area)     Jerking movement on right arm and lkeg  2. ONSET: "When did this start happening?" (e.g., hours, days, weeks, months ago)     This morning, 3 hours ago  3. DURATION: "How long does the jerk, twitch, or spasm last?"     3-4 seconds  4. FREQUENCY:  "How often does this happen?"      Every 10-15 seconds  5. WHEN: "When does this happen?" (e.g., while awake, while falling asleep, while sleeping)     When awake  6. CAUSE: "What do you think caused the jerking?"     Unsure of cause  7. OTHER SYMPTOMS: "Are there any other symptoms?" (e.g., fever, headache)     No  8. PREGNANCY: "Is  there any chance you are pregnant?" "When was your last menstrual period?"     No  Protocols used: Muscle Aches and Body Pain-A-AH, Muscle Jerks - Tics - [facility]

## 2023-04-17 NOTE — ED Provider Triage Note (Signed)
Emergency Medicine Provider Triage Evaluation Note  Micheal Donovan , a 73 y.o. male  was evaluated in triage.  Pt complains of right leg jerking started this morning.  No injury.  No numbness or weakness.  No history of similar..  Review of Systems  Positive:  Negative:   Physical Exam  BP (!) 129/49 (BP Location: Left Arm)   Pulse 71   Temp 98.8 F (37.1 C) (Oral)   Resp 17   SpO2 96%  Gen:   Awake, no distress   Resp:  Normal effort  MSK:   Moves extremities without difficulty  Other:    Medical Decision Making  Medically screening exam initiated at 1:50 PM.  Appropriate orders placed.  Micheal Donovan was informed that the remainder of the evaluation will be completed by another provider, this initial triage assessment does not replace that evaluation, and the importance of remaining in the ED until their evaluation is complete.  Basic labs and CK   Micheal Donovan, [place] 04/17/23 1350

## 2023-04-18 ENCOUNTER — Inpatient Hospital Stay: Payer: PPO

## 2023-04-18 DIAGNOSIS — Z79899 Other long term (current) drug therapy: Secondary | ICD-10-CM | POA: Diagnosis not present

## 2023-04-18 DIAGNOSIS — I452 Bifascicular block: Secondary | ICD-10-CM | POA: Diagnosis not present

## 2023-04-18 DIAGNOSIS — I5032 Chronic diastolic (congestive) heart failure: Secondary | ICD-10-CM | POA: Diagnosis not present

## 2023-04-18 DIAGNOSIS — E785 Hyperlipidemia, unspecified: Secondary | ICD-10-CM | POA: Diagnosis not present

## 2023-04-18 DIAGNOSIS — Z85118 Personal history of other malignant neoplasm of bronchus and lung: Secondary | ICD-10-CM | POA: Diagnosis not present

## 2023-04-18 DIAGNOSIS — E11319 Type 2 diabetes mellitus with unspecified diabetic retinopathy without macular edema: Secondary | ICD-10-CM | POA: Diagnosis not present

## 2023-04-18 DIAGNOSIS — I110 Hypertensive heart disease with heart failure: Secondary | ICD-10-CM | POA: Diagnosis not present

## 2023-04-18 DIAGNOSIS — T465X6A Underdosing of other antihypertensive drugs, initial encounter: Secondary | ICD-10-CM | POA: Diagnosis not present

## 2023-04-18 DIAGNOSIS — Z8249 Family history of ischemic heart disease and other diseases of the circulatory system: Secondary | ICD-10-CM | POA: Diagnosis not present

## 2023-04-18 DIAGNOSIS — G40A09 Absence epileptic syndrome, not intractable, without status epilepticus: Secondary | ICD-10-CM | POA: Diagnosis not present

## 2023-04-18 DIAGNOSIS — R253 Fasciculation: Secondary | ICD-10-CM | POA: Diagnosis present

## 2023-04-18 DIAGNOSIS — N400 Enlarged prostate without lower urinary tract symptoms: Secondary | ICD-10-CM | POA: Diagnosis not present

## 2023-04-18 DIAGNOSIS — R569 Unspecified convulsions: Secondary | ICD-10-CM

## 2023-04-18 DIAGNOSIS — L89152 Pressure ulcer of sacral region, stage 2: Secondary | ICD-10-CM | POA: Diagnosis not present

## 2023-04-18 DIAGNOSIS — Z87891 Personal history of nicotine dependence: Secondary | ICD-10-CM | POA: Diagnosis not present

## 2023-04-18 DIAGNOSIS — F32A Depression, unspecified: Secondary | ICD-10-CM | POA: Diagnosis not present

## 2023-04-18 DIAGNOSIS — Z794 Long term (current) use of insulin: Secondary | ICD-10-CM | POA: Diagnosis not present

## 2023-04-18 DIAGNOSIS — E11649 Type 2 diabetes mellitus with hypoglycemia without coma: Secondary | ICD-10-CM | POA: Diagnosis not present

## 2023-04-18 DIAGNOSIS — F419 Anxiety disorder, unspecified: Secondary | ICD-10-CM | POA: Diagnosis not present

## 2023-04-18 DIAGNOSIS — K219 Gastro-esophageal reflux disease without esophagitis: Secondary | ICD-10-CM | POA: Diagnosis not present

## 2023-04-18 DIAGNOSIS — B965 Pseudomonas (aeruginosa) (mallei) (pseudomallei) as the cause of diseases classified elsewhere: Secondary | ICD-10-CM | POA: Diagnosis not present

## 2023-04-18 DIAGNOSIS — C3412 Malignant neoplasm of upper lobe, left bronchus or lung: Secondary | ICD-10-CM | POA: Diagnosis not present

## 2023-04-18 DIAGNOSIS — Z85820 Personal history of malignant melanoma of skin: Secondary | ICD-10-CM | POA: Diagnosis not present

## 2023-04-18 DIAGNOSIS — G40909 Epilepsy, unspecified, not intractable, without status epilepticus: Secondary | ICD-10-CM

## 2023-04-18 DIAGNOSIS — N390 Urinary tract infection, site not specified: Secondary | ICD-10-CM | POA: Diagnosis not present

## 2023-04-18 DIAGNOSIS — Z993 Dependence on wheelchair: Secondary | ICD-10-CM | POA: Diagnosis not present

## 2023-04-18 DIAGNOSIS — T426X6A Underdosing of other antiepileptic and sedative-hypnotic drugs, initial encounter: Secondary | ICD-10-CM | POA: Diagnosis not present

## 2023-04-18 LAB — CBC
HCT: 39.8 % (ref 39.0–52.0)
Hemoglobin: 13.2 g/dL (ref 13.0–17.0)
MCH: 29.9 pg (ref 26.0–34.0)
MCHC: 33.2 g/dL (ref 30.0–36.0)
MCV: 90.2 fL (ref 80.0–100.0)
Platelets: 207 10*3/uL (ref 150–400)
RBC: 4.41 MIL/uL (ref 4.22–5.81)
RDW: 12.8 % (ref 11.5–15.5)
WBC: 7.3 10*3/uL (ref 4.0–10.5)
nRBC: 0 % (ref 0.0–0.2)

## 2023-04-18 LAB — GLUCOSE, CAPILLARY
Glucose-Capillary: 166 mg/dL — ABNORMAL HIGH (ref 70–99)
Glucose-Capillary: 65 mg/dL — ABNORMAL LOW (ref 70–99)
Glucose-Capillary: 93 mg/dL (ref 70–99)
Glucose-Capillary: 114 mg/dL — ABNORMAL HIGH (ref 70–99)
Glucose-Capillary: 137 mg/dL — ABNORMAL HIGH (ref 70–99)
Glucose-Capillary: 146 mg/dL — ABNORMAL HIGH (ref 70–99)

## 2023-04-18 LAB — BASIC METABOLIC PANEL
Anion gap: 9 (ref 5–15)
BUN: 13 mg/dL (ref 8–23)
CO2: 21 mmol/L — ABNORMAL LOW (ref 22–32)
Calcium: 9.1 mg/dL (ref 8.9–10.3)
Chloride: 108 mmol/L (ref 98–111)
Creatinine, Ser: 0.9 mg/dL (ref 0.61–1.24)
GFR, Estimated: >60 mL/min (ref >60)
Glucose, Bld: 133 mg/dL — ABNORMAL HIGH (ref 70–99)
Potassium: 3.5 mmol/L (ref 3.5–5.1)
Sodium: 138 mmol/L (ref 135–145)

## 2023-04-18 LAB — MAGNESIUM: Magnesium: 2.3 mg/dL (ref 1.7–2.4)

## 2023-04-18 LAB — PHOSPHORUS: Phosphorus: 2.7 mg/dL (ref 2.5–4.6)

## 2023-04-18 MED ORDER — TAMSULOSIN HCL 0.4 MG PO CAPS
0.4000 mg | ORAL_CAPSULE | Freq: Every day | ORAL | Status: DC
  Administered 2023-04-18 – 2023-04-21 (×4): 0.4 mg via ORAL
  Filled 2023-04-18 (×4): qty 1

## 2023-04-18 MED ORDER — METHOCARBAMOL 500 MG PO TABS
500.0000 mg | ORAL_TABLET | Freq: Four times a day (QID) | ORAL | Status: DC | PRN
  Administered 2023-04-18 (×2): 500 mg via ORAL
  Filled 2023-04-18 (×2): qty 1

## 2023-04-18 MED ORDER — PANTOPRAZOLE SODIUM 40 MG PO TBEC
40.0000 mg | DELAYED_RELEASE_TABLET | Freq: Every day | ORAL | Status: DC
  Administered 2023-04-18 – 2023-04-21 (×4): 40 mg via ORAL
  Filled 2023-04-18 (×4): qty 1

## 2023-04-18 MED ORDER — GADOBUTROL 1 MMOL/ML IV SOLN
5.0000 mL | Freq: Once | INTRAVENOUS | Status: AC | PRN
  Administered 2023-04-18: 5 mL via INTRAVENOUS

## 2023-04-18 MED ORDER — ENOXAPARIN SODIUM 40 MG/0.4ML IJ SOSY: 40.0000 mg | PREFILLED_SYRINGE | Freq: Every evening | INTRAMUSCULAR | Status: DC

## 2023-04-18 MED ORDER — ALPRAZOLAM 0.5 MG PO TABS
0.5000 mg | ORAL_TABLET | Freq: Every evening | ORAL | Status: DC | PRN
  Filled 2023-04-18: qty 1

## 2023-04-18 MED ORDER — CITALOPRAM HYDROBROMIDE 20 MG PO TABS
10.0000 mg | ORAL_TABLET | Freq: Every day | ORAL | Status: DC
  Administered 2023-04-18 – 2023-04-21 (×4): 10 mg via ORAL
  Filled 2023-04-18 (×4): qty 1

## 2023-04-18 MED ORDER — LEVETIRACETAM 500 MG PO TABS
500.0000 mg | ORAL_TABLET | Freq: Two times a day (BID) | ORAL | Status: DC
  Administered 2023-04-18 – 2023-04-21 (×6): 500 mg via ORAL
  Filled 2023-04-18 (×6): qty 1

## 2023-04-18 MED ORDER — FENTANYL 75 MCG/HR TD PT72
1.0000 | MEDICATED_PATCH | TRANSDERMAL | Status: DC
  Administered 2023-04-19: 1 via TRANSDERMAL
  Filled 2023-04-18 (×2): qty 1

## 2023-04-18 MED ORDER — FINASTERIDE 5 MG PO TABS
5.0000 mg | ORAL_TABLET | Freq: Every day | ORAL | Status: DC
  Administered 2023-04-18 – 2023-04-21 (×4): 5 mg via ORAL
  Filled 2023-04-18 (×4): qty 1

## 2023-04-18 MED ORDER — SENNA 8.6 MG PO TABS: 1.0000 | ORAL_TABLET | Freq: Two times a day (BID) | ORAL | Status: DC | PRN

## 2023-04-18 MED ORDER — GABAPENTIN 300 MG PO CAPS
600.0000 mg | ORAL_CAPSULE | Freq: Two times a day (BID) | ORAL | Status: DC
  Administered 2023-04-18 – 2023-04-21 (×7): 600 mg via ORAL
  Filled 2023-04-18 (×7): qty 2

## 2023-04-18 MED ORDER — INSULIN GLARGINE-YFGN 100 UNIT/ML ~~LOC~~ SOLN
5.0000 [IU] | Freq: Every day | SUBCUTANEOUS | Status: DC
  Administered 2023-04-19: 5 [IU] via SUBCUTANEOUS
  Filled 2023-04-18 (×3): qty 0.05

## 2023-04-18 MED ORDER — OXYCODONE-ACETAMINOPHEN 7.5-325 MG PO TABS
1.0000 | ORAL_TABLET | ORAL | Status: DC | PRN
  Administered 2023-04-18 – 2023-04-20 (×4): 1 via ORAL
  Filled 2023-04-18 (×5): qty 1

## 2023-04-18 MED ORDER — DEXTROSE 50 % IV SOLN
INTRAVENOUS | Status: AC
Start: 2023-04-18 — End: 2023-04-18
  Filled 2023-04-18: qty 50

## 2023-04-18 MED ORDER — LEVETIRACETAM IN NACL 500 MG/100ML IV SOLN
500.0000 mg | Freq: Two times a day (BID) | INTRAVENOUS | Status: DC
  Filled 2023-04-18 (×7): qty 100

## 2023-04-18 MED ORDER — IRBESARTAN 150 MG PO TABS
75.0000 mg | ORAL_TABLET | Freq: Every day | ORAL | Status: DC
  Administered 2023-04-18 – 2023-04-19 (×2): 75 mg via ORAL
  Filled 2023-04-18 (×2): qty 1

## 2023-04-18 MED ORDER — ALPRAZOLAM 0.5 MG PO TABS
0.5000 mg | ORAL_TABLET | Freq: Once | ORAL | Status: AC | PRN
  Administered 2023-04-18: 0.5 mg via ORAL

## 2023-04-18 NOTE — Plan of Care (Signed)
  Problem: Education: Goal: Ability to describe self-care measures that may prevent or decrease complications (Diabetes Survival Skills Education) will improve Outcome: Not Progressing   Problem: Coping: Goal: Ability to adjust to condition or change in health will improve Outcome: Not Progressing   Problem: Fluid Volume: Goal: Ability to maintain a balanced intake and output will improve Outcome: Not Progressing   Problem: Metabolic: Goal: Ability to maintain appropriate glucose levels will improve Outcome: Not Progressing   Problem: Nutritional: Goal: Progress toward achieving an optimal weight will improve Outcome: Not Progressing   Problem: Skin Integrity: Goal: Risk for impaired skin integrity will decrease Outcome: Not Progressing   Problem: Activity: Goal: Risk for activity intolerance will decrease Outcome: Not Progressing   Problem: Coping: Goal: Level of anxiety will decrease Outcome: Not Progressing

## 2023-04-18 NOTE — Progress Notes (Signed)
CBG 65-see labs and MAR

## 2023-04-18 NOTE — Progress Notes (Signed)
[facility] Progress Note  Patient: Micheal Donovan    WUJ:[id number]  DOA: 04/17/2023     Date of Service: the patient was seen and examined on 04/18/2023  No chief complaint on file.  Brief hospital course: Micheal Donovan is a 73 y.o. male with medical history significant of non-small cell lung cancer on Keytruda, absence seizure not taking medications currently, hypertension, hyperlipidemia, diabetes mellitus, diastolic CHF, stroke, depression with anxiety, BPH, melanoma, PE not on anticoagulants (finished treatment), who presents with twitching in right leg and arm.   Per her daughter and the wife at bedside, pt started having right leg and arm "twitch" that comes and goes since this morning.  No weakness or tingling in extremities.  No facial droop or slurred speech.  Patient did not have confusion or altered mental status at home, but became confused after having received 3 doses of total 2.5 mg of Ativan in ED.  No more twitching in ED.  Patient does not have chest pain, cough, SOB.  No nausea, vomiting, diarrhea or abdominal pain.  Not sure if patient has symptoms of UTI.  Of note, patient had urinalysis by his doctor yesterday which was positive for UTI.  UA on 04/16/2023 showed cloudy appearance, 3+ leukocytes, positive nitrite, many bacteria, WBC> 30.  Per his wife, patient has history of absence seizure, used to be on Keppra, but patient himself stopped taking Keppra for more than 3 months.   Data reviewed independently and ED Course: pt was found to have WBC 7.3, CK 72, GFR> 60, temperature normal, blood pressure 134/56, heart rate 71, RR 16, oxygen saturation 98% on room air.  Patient is placed on telemetry bed for observation.   MRI of brain: Severely motion limited study and incomplete study without obvious acute abnormality. Of note, postcontrast imaging was not performed and metastatic disease cannot be excluded.     EKG: I have personally reviewed.  Sinus rhythm, QTc 499,  bifascicular block  Assessment and Plan:  # Seizure: Patient symptoms are likely due to seizure.  Patient has history of seizure, but stopped taking Keppra for more than 3 months.  He has UTI which likely lowered threshold of seizure.   MRI of the brain is limited study, but did not show obvious abnormalities. Continue seizure precaution -As needed Ativan for seizure -Load Keppra 1000 mg by IV -Restart Keppra 500 mg BID PO vs IV if patient is unable to swallow -As needed Robaxin -Fall precaution -Frequent neurocheck Follow EEG Follow neurology consult for further recommendation   # UTI (urinary tract infection) -IV Rocephin -Repeated urine analysis, and follow-up urine culture   # Primary non-small cell carcinoma of upper lobe of left lung -Patient is on Keytruda currently -Follow-up with Dr. Cathie Hoops of oncology   # Type 2 diabetes mellitus without complications: Recent A1c 6.6, well-controlled.  Patient taking NovoLog and as needed Lantus -Sliding scale insulin -Glargine insulin 5 units daily   # Essential hypertension:  Patient's not taking his Diovan currently per his wife.   Replace Diovan with irbesartan 75 mg p.o. daily -IV hydralazine as needed Monitor BP and titrate medication accordingly  # Chronic diastolic CHF (congestive heart failure) (HCC): 2D echo on 06/18/2022 showed EF 55-60% with grade 1 diastolic dysfunction.  Patient does not have leg edema or JVD.  No oxygen desaturation.  CHF seem to be compensated.  BNP --> 245   # BPH (benign prostatic hyperplasia) -Flomax and Proscar   # Depression with anxiety -Continue  home medications     Body mass index is 19.61 kg/m.  Interventions:  Pressure Injury 02/09/23 Sacrum Medial;Lower Stage 2 -  Partial thickness loss of dermis presenting as a shallow open injury with a red, pink wound bed without slough. pink, open sore (Active)  02/09/23 1400  Location: Sacrum  Location Orientation: Medial;Lower  Staging: Stage  2 -  Partial thickness loss of dermis presenting as a shallow open injury with a red, pink wound bed without slough.  Wound Description (Comments): pink, open sore  Present on Admission: Yes     Diet: Dysphagia 2 diet DVT Prophylaxis: Subcutaneous Lovenox   Advance goals of care discussion: Full code  Family Communication: family was present at bedside, at the time of interview.  The pt provided permission to discuss medical plan with the family. Opportunity was given to ask question and all questions were answered satisfactorily.   Disposition:  Pt is from home, admitted with seizures, still has risk of seizures, mental status is not back to baseline, which precludes a safe discharge. Discharge to home, when cleared by neurology.  Subjective: No significant events overnight, patient is still groggy, AO x 2, thinks that he is at home. Patient denied any headache or dizziness, no chest pain or crepitus, no any other active issues  Physical Exam: General: NAD, lying comfortably Appear in no distress, affect appropriate Eyes: PERRLA ENT: Oral Mucosa Clear, moist  Neck: no JVD,  Cardiovascular: S1 and S2 Present, no Murmur,  Respiratory: good respiratory effort, Bilateral Air entry equal and Decreased, no Crackles, no wheezes Abdomen: Bowel Sound present, Soft and no tenderness,  Skin: no rashes Extremities: no Pedal edema, no calf tenderness Neurologic: without any new focal findings Gait not checked due to patient safety concerns  Vitals:   04/18/23 0131 04/18/23 0411 04/18/23 0809 04/18/23 1152  BP:  (!) 158/85 (!) 167/86 (!) 151/79  Pulse:  84 78 81  Resp:  16 18 15   Temp:  97.6 F (36.4 C) 98 F (36.7 C) 98.3 F (36.8 C)  TempSrc:  Axillary  Axillary  SpO2:  100% 100% 99%  Weight: 56.8 kg     Height: 5\' 7"  (1.702 m)       Intake/Output Summary (Last 24 hours) at 04/18/2023 1354 Last data filed at 04/18/2023 1100 Gross per 24 hour  Intake 171.62 ml  Output --  Net  171.62 ml   Filed Weights   04/17/23 1636 04/18/23 0131  Weight: 57.1 kg 56.8 kg    Data Reviewed: I have personally reviewed and interpreted daily labs, tele strips, imagings as discussed above. I reviewed all nursing notes, pharmacy notes, vitals, pertinent old records I have discussed plan of care as described above with RN and patient/family.  CBC: Recent Labs  Lab 04/17/23 1352 04/18/23 0526  WBC 7.3 7.3  NEUTROABS 5.8  --   HGB 12.2* 13.2  HCT 37.6* 39.8  MCV 91.9 90.2  PLT 242 207   Basic Metabolic Panel: Recent Labs  Lab 04/17/23 1352 04/18/23 0848  NA 137 138  K 3.8 3.5  CL 103 108  CO2 22 21*  GLUCOSE 193* 133*  BUN 16 13  CREATININE 0.95 0.90  CALCIUM 9.3 9.1  MG 2.3 2.3  PHOS 2.7 2.7    Studies: MR Brain W and Wo Contrast Result Date: 04/17/2023 CLINICAL DATA:  Seizure disorder, clinical change New right sided invol jerking, hx of non-small cell lung ca EXAM: MRI HEAD WITHOUT AND WITH CONTRAST TECHNIQUE: Multiplanar,  multiecho pulse sequences of the brain and surrounding structures were obtained without and with intravenous contrast. CONTRAST:  1mL GADAVIST GADOBUTROL 1 MMOL/ML IV SOLN COMPARISON:  MRI 02/24/2023. FINDINGS: Severely motion limited and incomplete study study. Many sequences were not performed, including post-contrast imaging. The following sequences were performed: DWI/ADC and motion limited sagittal T1 and axial T2/FLAIR. Within this limitation: Brain: No acute infarction, acute hemorrhage, hydrocephalus, extra-axial collection or mass lesion. Remote left cerebellar hemorrhagic infarct. Cerebral atrophy. Vascular: Not well assessed due to motion. Skull and upper cervical spine: Normal marrow signal. Sinuses/Orbits: Mostly clear sinuses. Other: No mastoid effusions. IMPRESSION: Severely motion limited study and incomplete study without obvious acute abnormality. Of note, postcontrast imaging was not performed and metastatic disease cannot be  excluded. Electronically Signed   By: Feliberto Harts M.D.   On: 04/17/2023 20:38    Scheduled Meds:  citalopram  10 mg Oral Daily   finasteride  5 mg Oral Daily   gabapentin  600 mg Oral BID   insulin aspart  0-5 Units Subcutaneous QHS   insulin aspart  0-9 Units Subcutaneous TID WC   insulin glargine-yfgn  5 Units Subcutaneous QHS   irbesartan  75 mg Oral Daily   levETIRAcetam  500 mg Oral BID   pantoprazole  40 mg Oral Daily   tamsulosin  0.4 mg Oral Daily   Continuous Infusions:  cefTRIAXone (ROCEPHIN)  IV Stopped (04/17/23 2334)   levETIRAcetam     PRN Meds: acetaminophen, ALPRAZolam, hydrALAZINE, LORazepam, methocarbamol, ondansetron (ZOFRAN) IV, oxyCODONE-acetaminophen, senna  Time spent: 55 minutes  Author: Gillis Santa. MD [facility] 04/18/2023 1:54 PM  To reach On-call, see care teams to locate the attending and reach out to them via [email or web address]. If 7PM-7AM, please contact night-coverage If you still have difficulty reaching the attending provider, please page the [facility] (Director on Call) for [facility] on amion for assistance.

## 2023-04-18 NOTE — Plan of Care (Incomplete)
This is a 73 yo gentleman with hx significant for NSCLC on keytruda, complex partial seizures previously on keppra but has not been taking it x3 mos, HTN, H, DM2, diastolic HF, depression, BPH, melanoma who presents with breakthrough focal seizure with impaired awareness in the setting of self-discontinuation of keppra. On admission noted to have UTI. MRI brain yesterday was severely motion-degraded and was aborted 2/2 agitation prior to obtaining contrasted sequences.  Favor breakthrough seizure to be provoked in the setting of medication noncompliance and UTI.   Wife showed me multiple videos of staring spells which on my review were highly suspicious for complex partial seizure. Unfortunately patient and wife have a very difficult time describing the motor abnormalities he had in the past few days. He reports an uncontrollable urge to move his legs however movements happen when he is awake as well as sleeping. Wife states movements are repetitive "and more regular than irregular but not really either." Seizure remains highest on my differential since other forms of abnormal movements that might be described similarly are definitively not rhythmic.  Patient's sx resolved after receiving keppra. Of note he became delirious last night after multiple rounds of ativan for MRI but is now back to baseline.  - Keppra loaded and restarted at 500mg  bid; continue this at discharge. If further clinical seizure activity will uptitrate this. - Counseling of patient and family on importance of medication compliance - Repeat MRI brain wwo when patient able to tolerate scan. Patient says he will reattempt with xanax (not ativan) premed. Please page neurology if any abnormal findings particularly in the setting of known malignancy. - Routine EEG Monday - Monitor for 24 more hrs, wife instructed to take video if abnormal movements recur - Check ferritin, low iron is risk factor for RLS - If movements resume, consider  discontinuation of possible medication culprits incl gabapentin and/or celexa, if feasible  Bing Neighbors, MD [facility] [phone]  If 7pm- 7am, please page neurology on call as listed in AMION.

## 2023-04-18 NOTE — Progress Notes (Signed)
Per patient's wife, pt has a fentanyl patch on that needed to be change on 2/14. Upon assessment, patch was found on pt R upper back. Patch removed and wasted in pyxis room (Producer, television/film/video) witnessed by Hilton Sinclair RN  NP Jon Billings made aware and received new orders. See MAR  Tinsleigh Slovacek Cleophus Molt, RN

## 2023-04-19 DIAGNOSIS — R569 Unspecified convulsions: Secondary | ICD-10-CM | POA: Diagnosis not present

## 2023-04-19 LAB — CBC
HCT: 33.9 % — ABNORMAL LOW (ref 39.0–52.0)
Hemoglobin: 11.4 g/dL — ABNORMAL LOW (ref 13.0–17.0)
MCH: 30 pg (ref 26.0–34.0)
MCHC: 33.6 g/dL (ref 30.0–36.0)
MCV: 89.2 fL (ref 80.0–100.0)
Platelets: 229 10*3/uL (ref 150–400)
RBC: 3.8 MIL/uL — ABNORMAL LOW (ref 4.22–5.81)
RDW: 12.9 % (ref 11.5–15.5)
WBC: 5.1 10*3/uL (ref 4.0–10.5)
nRBC: 0 % (ref 0.0–0.2)

## 2023-04-19 LAB — MAGNESIUM: Magnesium: 2.2 mg/dL (ref 1.7–2.4)

## 2023-04-19 LAB — BASIC METABOLIC PANEL
Anion gap: 7 (ref 5–15)
BUN: 15 mg/dL (ref 8–23)
CO2: 23 mmol/L (ref 22–32)
Calcium: 9 mg/dL (ref 8.9–10.3)
Chloride: 106 mmol/L (ref 98–111)
Creatinine, Ser: 0.92 mg/dL (ref 0.61–1.24)
GFR, Estimated: >60 mL/min (ref >60)
Glucose, Bld: 98 mg/dL (ref 70–99)
Potassium: 3.3 mmol/L — ABNORMAL LOW (ref 3.5–5.1)
Sodium: 136 mmol/L (ref 135–145)

## 2023-04-19 LAB — GLUCOSE, CAPILLARY
Glucose-Capillary: 91 mg/dL (ref 70–99)
Glucose-Capillary: 137 mg/dL — ABNORMAL HIGH (ref 70–99)
Glucose-Capillary: 115 mg/dL — ABNORMAL HIGH (ref 70–99)
Glucose-Capillary: 101 mg/dL — ABNORMAL HIGH (ref 70–99)
Glucose-Capillary: 106 mg/dL — ABNORMAL HIGH (ref 70–99)

## 2023-04-19 LAB — FERRITIN: Ferritin: 225 ng/mL (ref 24–336)

## 2023-04-19 LAB — PHOSPHORUS: Phosphorus: 3.3 mg/dL (ref 2.5–4.6)

## 2023-04-19 MED ORDER — MIDODRINE HCL 5 MG PO TABS
10.0000 mg | ORAL_TABLET | Freq: Three times a day (TID) | ORAL | Status: DC
  Administered 2023-04-19 – 2023-04-21 (×6): 10 mg via ORAL
  Filled 2023-04-19 (×6): qty 2

## 2023-04-19 MED ORDER — MIDODRINE HCL 5 MG PO TABS
5.0000 mg | ORAL_TABLET | Freq: Three times a day (TID) | ORAL | Status: DC | PRN
Start: 2023-04-19 — End: 2023-04-19
  Administered 2023-04-19: 5 mg via ORAL
  Filled 2023-04-19: qty 1

## 2023-04-19 MED ORDER — ENOXAPARIN SODIUM 40 MG/0.4ML IJ SOSY
40.0000 mg | PREFILLED_SYRINGE | Freq: Every evening | INTRAMUSCULAR | Status: DC
  Administered 2023-04-19 – 2023-04-20 (×2): 40 mg via SUBCUTANEOUS
  Filled 2023-04-19 (×2): qty 0.4

## 2023-04-19 MED ORDER — POTASSIUM CHLORIDE CRYS ER 20 MEQ PO TBCR
40.0000 meq | EXTENDED_RELEASE_TABLET | Freq: Once | ORAL | Status: AC
  Administered 2023-04-19: 40 meq via ORAL
  Filled 2023-04-19: qty 2

## 2023-04-19 MED ORDER — SODIUM CHLORIDE 0.9 % IV BOLUS
500.0000 mL | Freq: Once | INTRAVENOUS | Status: AC
  Administered 2023-04-19: 500 mL via INTRAVENOUS

## 2023-04-19 NOTE — Progress Notes (Signed)
NEUROLOGY CONSULT FOLLOW UP NOTE   Date of service: April 19, 2023 Patient Name: Micheal Donovan MRN:  [id number] DOB:  June 30, 1950  Interval Hx/subjective   Patient has had no further episodes concerning for seizures since Keppra was restarted No new neurologic complaints today.  He was able to tolerate repeat MRI brain which showed no acute process, no evidence of metastatic disease. It did show remote hemorrhagic infarct L cerebellum.  Vitals   Vitals:   04/18/23 1609 04/18/23 2051 04/19/23 0402 04/19/23 0934  BP: (!) 88/50 112/65 (!) 95/54 (!) 92/45  Pulse:  80 74 66  Resp: 16 16 16 18   Temp: 97.8 F (36.6 C) 98 F (36.7 C) 98.4 F (36.9 C) 98 F (36.7 C)  TempSrc:  Oral Oral Oral  SpO2: 99% 98% 98% 99%  Weight:      Height:         Body mass index is 19.61 kg/m.  Physical Exam   Gen: patient lying in bed, NAD CV: extremities appear well-perfused Resp: normal WOB   Neurologic Examination    MS: alert, oriented x4, follows commands Speech: no dysarthria, no aphasia CN: PERRL, VFF, EOMI, sensation intact, face symmetric, hearing intact to voice Motor: 5/5 strength throughout Sensory: SILT Reflexes: 2+ symm with toes down bilat Coordination: FNF intact bilat Gait: deferred  Medications  Current Facility-Administered Medications:    acetaminophen (TYLENOL) tablet 650 mg, 650 mg, Oral, Q6H PRN, Lorretta Harp, MD   ALPRAZolam Prudy Feeler) tablet 0.5 mg, 0.5 mg, Oral, QHS PRN, Lorretta Harp, MD   cefTRIAXone (ROCEPHIN) 1 g in sodium chloride 0.9 % 100 mL IVPB, 1 g, Intravenous, Q24H, Lorretta Harp, MD, Last Rate: 200 mL/hr at 04/18/23 2202, 1 g at 04/18/23 2202   citalopram (CELEXA) tablet 10 mg, 10 mg, Oral, Daily, Lorretta Harp, MD, 10 mg at 04/19/23 0834   fentaNYL (DURAGESIC) 75 MCG/HR 1 patch, 1 patch, Transdermal, Q3 days, Manuela Schwartz, NP, 1 patch at 04/19/23 0139   finasteride (PROSCAR) tablet 5 mg, 5 mg, Oral, Daily, Lorretta Harp, MD, 5 mg at 04/19/23 4098    gabapentin (NEURONTIN) capsule 600 mg, 600 mg, Oral, BID, Lorretta Harp, MD, 600 mg at 04/19/23 1191   hydrALAZINE (APRESOLINE) injection 5 mg, 5 mg, Intravenous, Q2H PRN, Lorretta Harp, MD   insulin aspart (novoLOG) injection 0-5 Units, 0-5 Units, Subcutaneous, QHS, Lorretta Harp, MD   insulin aspart (novoLOG) injection 0-9 Units, 0-9 Units, Subcutaneous, TID WC, Lorretta Harp, MD   insulin glargine-yfgn (SEMGLEE) injection 5 Units, 5 Units, Subcutaneous, QHS, Lorretta Harp, MD   levETIRAcetam (KEPPRA) tablet 500 mg, 500 mg, Oral, BID, 500 mg at 04/19/23 0834 **OR** levETIRAcetam (KEPPRA) IVPB 500 mg/100 mL premix, 500 mg, Intravenous, BID, Gillis Santa, MD   LORazepam (ATIVAN) injection 2 mg, 2 mg, Intravenous, Q2H PRN, Lorretta Harp, MD, 2 mg at 04/17/23 2316   methocarbamol (ROBAXIN) tablet 500 mg, 500 mg, Oral, Q6H PRN, Lorretta Harp, MD, 500 mg at 04/18/23 2027   midodrine (PROAMATINE) tablet 5 mg, 5 mg, Oral, TID PRN, Gillis Santa, MD, 5 mg at 04/19/23 1014   ondansetron (ZOFRAN) injection 4 mg, 4 mg, Intravenous, Q8H PRN, Lorretta Harp, MD   oxyCODONE-acetaminophen (PERCOCET) 7.5-325 MG per tablet 1 tablet, 1 tablet, Oral, Q4H PRN, Lorretta Harp, MD, 1 tablet at 04/19/23 1039   pantoprazole (PROTONIX) EC tablet 40 mg, 40 mg, Oral, Daily, Lorretta Harp, MD, 40 mg at 04/19/23 0834   senna (SENOKOT) tablet 8.6 mg, 1 tablet, Oral, BID PRN,  Lorretta Harp, MD   tamsulosin [facility]) capsule 0.4 mg, 0.4 mg, Oral, Daily, Lorretta Harp, MD, 0.4 mg at 04/19/23 1610  Facility-Administered Medications Ordered in Other Encounters:    heparin lock flush 100 UNIT/ML injection, , , ,   Labs and Diagnostic Imaging   CBC:  Recent Labs  Lab 04/17/23 1352 04/18/23 0526 04/19/23 0648  WBC 7.3 7.3 5.1  NEUTROABS 5.8  --   --   HGB 12.2* 13.2 11.4*  HCT 37.6* 39.8 33.9*  MCV 91.9 90.2 89.2  PLT 242 207 229    Basic Metabolic Panel:  Lab Results  Component Value Date   NA 136 04/19/2023   K 3.3 (L) 04/19/2023   CO2 23 04/19/2023    GLUCOSE 98 04/19/2023   BUN 15 04/19/2023   CREATININE 0.92 04/19/2023   CALCIUM 9.0 04/19/2023   GFRNONAA >60 04/19/2023   GFRAA >89 03/05/2016   Lipid Panel:  Lab Results  Component Value Date   LDLCALC 88 10/02/2022   HgbA1c:  Lab Results  Component Value Date   HGBA1C 6.6 (H) 10/01/2022   Urine Drug Screen:     Component Value Date/Time   LABOPIA NONE DETECTED 06/11/2021 0239   COCAINSCRNUR NONE DETECTED 06/11/2021 0239   LABBENZ NONE DETECTED 06/11/2021 0239   AMPHETMU NONE DETECTED 06/11/2021 0239   THCU NONE DETECTED 06/11/2021 0239   LABBARB NONE DETECTED 06/11/2021 0239    Alcohol Level     Component Value Date/Time   ETH <10 06/11/2021 0118   INR  Lab Results  Component Value Date   INR 1.0 02/13/2023   APTT  Lab Results  Component Value Date   APTT 23 (L) 01/31/2023   AED levels:  Lab Results  Component Value Date   LEVETIRACETA <2.0 (L) 01/31/2023    MRI Brain(Personally reviewed): from 2/15, prior MRI was nondiagnostic 2/2 motion degradation 1. No evidence of acute intracranial abnormality or metastatic disease. 2. 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  rEEG:  Most recent EEG 10/02/22: This EEG was obtained while awake and asleep and is abnormal due to mild diffuse slowing indicative of global cerebral dysfunction. Epileptiform abnormalities were not seen during this recording.   Ferritin 225 (normal)  Assessment   This is a 73 yo gentleman with hx significant for NSCLC on keytruda, complex partial seizures previously on keppra but has not been taking it x3 mos, HTN, H, DM2, diastolic HF, depression, BPH, melanoma who presents with breakthrough focal seizure with impaired awareness in the setting of self-discontinuation of keppra. On admission noted to have UTI. MRI brain showed no acute findings or metastatic disease. Favor breakthrough seizure to be provoked in the setting of medication noncompliance and UTI. No recurrence of  seizure-like episodes in past 48 hrs since starting keppra.  Recommendations   - Continue keppra 500mg  bid now and at hospital discharge - I counseled patient on importance of AED compliance and he states he will take his keppra as prescribed and not miss doses - Patient should arrange f/u appts with his established neuro physicians Dr. Terrace Arabia and Dr. Barbaraann Cao - Counsel patient at discharge that according to [facility] law patients cannot drive x6 mos after last seizure  No further inpatient neurologic workup indicated. Neurology will sign off, but please re-engage if additional neurologic concerns arise. ______________________________________________________________________   Signed, Jefferson Fuel, MD [facility]

## 2023-04-19 NOTE — Consult Note (Signed)
NEUROLOGY CONSULT NOTE   Date of service: April 19, 2023 Patient Name: Micheal Donovan MRN:  [id number] DOB:  05-15-1950 Chief Complaint: focal seizure Requesting Provider: Gillis Santa, MD  History of Present Illness   This is a 73 yo gentleman with hx significant for NSCLC on keytruda, complex partial seizures previously on keppra but has not been taking it x3 mos, HTN, H, DM2, diastolic HF, depression, BPH, melanoma who presents with breakthrough focal seizure with impaired awareness in the setting of self-discontinuation of keppra. On admission noted to have UTI. MRI brain yesterday was severely motion-degraded and was aborted 2/2 agitation prior to obtaining contrasted sequences. Patient decribes R sided jerking movements that were at least semi-regular. He says he was not altered during the event but EDP says he was and patient has poor recall from the event. He did not have side effects from the keppra but instead discontinued it bc he felt like he no longer needed it.   ROS  Comprehensive ROS performed and pertinent positives documented in HPI   Past History   Past Medical History:  Diagnosis Date   Allergy    Anxiety    BPH (benign prostatic hypertrophy)    Cancer (HCC)    Melanoma on Neck    2008   Carotid artery occlusion    CHF (congestive heart failure) (HCC)    Diabetes mellitus    type 2   ED (erectile dysfunction)    GERD (gastroesophageal reflux disease)    Hemorrhagic stroke (HCC) 06/2021   Hyperlipidemia    Hypertension    Lung cancer (HCC)    Neoplasm related pain    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)    Stroke [facility])     Past Surgical History:  Procedure Laterality Date   ANTERIOR CERVICAL DECOMP/DISCECTOMY FUSION N/A 02/09/2023   Procedure: C3-5 ANTERIOR CERVICAL DISCECTOMY AND FUSION;  Surgeon: Venetia Night, MD;  Location: [facility] ORS;  Service: Neurosurgery;  Laterality: N/A;   BRONCHIAL NEEDLE ASPIRATION BIOPSY  06/17/2021   Procedure:  BRONCHIAL NEEDLE ASPIRATION BIOPSIES;  Surgeon: Lorin Glass, MD;  Location: [facility] ENDOSCOPY;  Service: Pulmonary;;   IR IMAGING GUIDED PORT INSERTION  08/05/2021   MELANOMA EXCISION  2008   Left side of neck   RADIOLOGY WITH ANESTHESIA N/A 04/05/2020   Procedure: MRI SPINE WITOUT CONTRAST;  Surgeon: Radiologist, Medication, MD;  Location: [facility] OR;  Service: Radiology;  Laterality: N/A;   RADIOLOGY WITH ANESTHESIA N/A 08/22/2021   Procedure: MRI BRAIN WITH AND WITHOUT CONTRAST  WITH ANESTHESIA;  Surgeon: Radiologist, Medication, MD;  Location: [facility] OR;  Service: Radiology;  Laterality: N/A;   RADIOLOGY WITH ANESTHESIA N/A 12/17/2021   Procedure: MRI BRAIN WITH AND WITHOUT CONTRAST WITH ANESTHESIA; MRI LUMBER WITH AND WITHOUT CONTRAST;  Surgeon: Radiologist, Medication, MD;  Location: [facility] OR;  Service: Radiology;  Laterality: N/A;   RADIOLOGY WITH ANESTHESIA N/A 04/08/2022   Procedure: MRI LUMBER SPINE WITH AND WITHOUT CONTRAST;  Surgeon: Radiologist, Medication, MD;  Location: [facility] OR;  Service: Radiology;  Laterality: N/A;   TONSILLECTOMY     VIDEO BRONCHOSCOPY WITH ENDOBRONCHIAL ULTRASOUND N/A 06/17/2021   Procedure: VIDEO BRONCHOSCOPY WITH ENDOBRONCHIAL ULTRASOUND;  Surgeon: Lorin Glass, MD;  Location: [facility] ENDOSCOPY;  Service: Pulmonary;  Laterality: N/A;    Family History: Family History  Problem Relation Age of Onset   COPD Mother    Heart disease Father    Heart disease Brother        MI at age 63  Stroke Neg Hx     Social History  reports that he quit smoking about 32 years ago. His smoking use included cigarettes. He has never used smokeless tobacco. He reports that he does not drink alcohol and does not use drugs.  Allergies  Allergen Reactions   Niaspan [Niacin]     FLUSHING    Medications   Current Facility-Administered Medications:    acetaminophen (TYLENOL) tablet 650 mg, 650 mg, Oral, Q6H PRN, Lorretta Harp, MD   ALPRAZolam Prudy Feeler) tablet 0.5 mg, 0.5 mg, Oral, QHS PRN, Lorretta Harp, MD   cefTRIAXone (ROCEPHIN) 1 g in sodium chloride 0.9 % 100 mL IVPB, 1 g, Intravenous, Q24H, Lorretta Harp, MD, Last Rate: 200 mL/hr at 04/18/23 2202, 1 g at 04/18/23 2202   citalopram (CELEXA) tablet 10 mg, 10 mg, Oral, Daily, Lorretta Harp, MD, 10 mg at 04/18/23 1156   fentaNYL (DURAGESIC) 75 MCG/HR 1 patch, 1 patch, Transdermal, Q3 days, Manuela Schwartz, NP, 1 patch at 04/19/23 0139   finasteride (PROSCAR) tablet 5 mg, 5 mg, Oral, Daily, Lorretta Harp, MD, 5 mg at 04/18/23 1158   gabapentin (NEURONTIN) capsule 600 mg, 600 mg, Oral, BID, Lorretta Harp, MD, 600 mg at 04/18/23 2151   hydrALAZINE (APRESOLINE) injection 5 mg, 5 mg, Intravenous, Q2H PRN, Lorretta Harp, MD   insulin aspart (novoLOG) injection 0-5 Units, 0-5 Units, Subcutaneous, QHS, Lorretta Harp, MD   insulin aspart (novoLOG) injection 0-9 Units, 0-9 Units, Subcutaneous, TID WC, Lorretta Harp, MD   insulin glargine-yfgn (SEMGLEE) injection 5 Units, 5 Units, Subcutaneous, QHS, Lorretta Harp, MD   irbesartan (AVAPRO) tablet 75 mg, 75 mg, Oral, Daily, Gillis Santa, MD, 75 mg at 04/18/23 1158   levETIRAcetam (KEPPRA) tablet 500 mg, 500 mg, Oral, BID, 500 mg at 04/18/23 2151 **OR** levETIRAcetam (KEPPRA) IVPB 500 mg/100 mL premix, 500 mg, Intravenous, BID, Gillis Santa, MD   LORazepam (ATIVAN) injection 2 mg, 2 mg, Intravenous, Q2H PRN, Lorretta Harp, MD, 2 mg at 04/17/23 2316   methocarbamol (ROBAXIN) tablet 500 mg, 500 mg, Oral, Q6H PRN, Lorretta Harp, MD, 500 mg at 04/18/23 2027   ondansetron (ZOFRAN) injection 4 mg, 4 mg, Intravenous, Q8H PRN, Lorretta Harp, MD   oxyCODONE-acetaminophen (PERCOCET) 7.5-325 MG per tablet 1 tablet, 1 tablet, Oral, Q4H PRN, Lorretta Harp, MD, 1 tablet at 04/18/23 2151   pantoprazole (PROTONIX) EC tablet 40 mg, 40 mg, Oral, Daily, Lorretta Harp, MD, 40 mg at 04/18/23 1158   senna (SENOKOT) tablet 8.6 mg, 1 tablet, Oral, BID PRN, Lorretta Harp, MD   tamsulosin [facility]) capsule 0.4 mg, 0.4 mg, Oral, Daily, Lorretta Harp, MD, 0.4 mg at  04/18/23 1157  Facility-Administered Medications Ordered in Other Encounters:    heparin lock flush 100 UNIT/ML injection, , , ,   Vitals   Vitals:   04/18/23 1152 04/18/23 1609 04/18/23 2051 04/19/23 0402  BP: (!) 151/79 (!) 88/50 112/65 (!) 95/54  Pulse: 81  80 74  Resp: 15 16 16 16   Temp: 98.3 F (36.8 C) 97.8 F (36.6 C) 98 F (36.7 C) 98.4 F (36.9 C)  TempSrc: Axillary  Oral Oral  SpO2: 99% 99% 98% 98%  Weight:      Height:        Body mass index is 19.61 kg/m.  Physical Exam   Gen: patient lying in bed, NAD CV: extremities appear well-perfused Resp: normal WOB  Neurologic Examination   MS: alert, oriented x4, follows commands Speech: no dysarthria, no aphasia CN: PERRL, VFF, EOMI, sensation intact,  face symmetric, hearing intact to voice Motor: 5/5 strength throughout Sensory: SILT Reflexes: 2+ symm with toes down bilat Coordination: FNF intact bilat Gait: deferred   Labs/Imaging/Neurodiagnostic studies   CBC:  Recent Labs  Lab 04-18-2023 1352 04/18/23 0526  WBC 7.3 7.3  NEUTROABS 5.8  --   HGB 12.2* 13.2  HCT 37.6* 39.8  MCV 91.9 90.2  PLT 242 207   Basic Metabolic Panel:  Lab Results  Component Value Date   NA 138 04/18/2023   K 3.5 04/18/2023   CO2 21 (L) 04/18/2023   GLUCOSE 133 (H) 04/18/2023   BUN 13 04/18/2023   CREATININE 0.90 04/18/2023   CALCIUM 9.1 04/18/2023   GFRNONAA >60 04/18/2023   GFRAA >89 03/05/2016   Lipid Panel:  Lab Results  Component Value Date   LDLCALC 88 10/02/2022   HgbA1c:  Lab Results  Component Value Date   HGBA1C 6.6 (H) 10/01/2022   Urine Drug Screen:     Component Value Date/Time   LABOPIA NONE DETECTED 06/11/2021 0239   COCAINSCRNUR NONE DETECTED 06/11/2021 0239   LABBENZ NONE DETECTED 06/11/2021 0239   AMPHETMU NONE DETECTED 06/11/2021 0239   THCU NONE DETECTED 06/11/2021 0239   LABBARB NONE DETECTED 06/11/2021 0239    Alcohol Level     Component Value Date/Time   ETH <10 06/11/2021  0118   INR  Lab Results  Component Value Date   INR 1.0 02/13/2023   APTT  Lab Results  Component Value Date   APTT 23 (L) 01/31/2023   AED levels:  Lab Results  Component Value Date   LEVETIRACETA <2.0 (L) 01/31/2023     MRI Brain(Personally reviewed): Non-diagnostic 2/2 motion degradation  ASSESSMENT   This is a 73 yo gentleman with hx significant for NSCLC on keytruda, complex partial seizures previously on keppra but has not been taking it x3 mos, HTN, H, DM2, diastolic HF, depression, BPH, melanoma who presents with breakthrough focal seizure with impaired awareness in the setting of self-discontinuation of keppra. On admission noted to have UTI. MRI brain yesterday was severely motion-degraded and was aborted 2/2 agitation prior to obtaining contrasted sequences.   Favor breakthrough seizure to be provoked in the setting of medication noncompliance and UTI.    Wife showed me multiple videos of staring spells which on my review were highly suspicious for complex partial seizure. Unfortunately patient and wife have a very difficult time describing the motor abnormalities he had in the past few days. He reports an uncontrollable urge to move his legs however movements happen when he is awake as well as sleeping. Wife states movements are repetitive "and more regular than irregular but not really either." Seizure remains highest on my differential since other forms of abnormal movements that might be described similarly are definitively not rhythmic.   Patient's sx resolved after receiving keppra. Of note he became delirious last night after multiple rounds of ativan for MRI but is now back to baseline.  RECOMMENDATIONS   - Keppra loaded and restarted at 500mg  bid; continue this at discharge. If further clinical seizure activity will uptitrate this. - Counseling of patient and family on importance of medication compliance - Repeat MRI brain wwo when patient able to tolerate  scan. Patient says he will reattempt with xanax (not ativan) premed. Please page neurology if any abnormal findings particularly in the setting of known malignancy. - Routine EEG Monday - Monitor for 24 more hrs, wife instructed to take video if abnormal movements recur - Check  ferritin, low iron is risk factor for RLS - If movements resume, consider discontinuation of possible medication culprits incl gabapentin and/or celexa, if feasible - Will continue to follow ______________________________________________________________________    Signed, Jefferson Fuel, MD [facility]

## 2023-04-19 NOTE — Evaluation (Signed)
Physical Therapy Evaluation Patient Details Name: Micheal Donovan MRN: [id number] DOB: May 29, 1950 Today's Date: 04/19/2023  History of Present Illness  Pt is a 73 y/o M admitted on 04/17/23 after presenting with c/o twitching in R leg & arm. Pt is being treated for a seizure. PMH: non-small cell lung CA, absence seizure, HTN, HLD, DM, diastolic CHF, depression with anxiety, BPH, melanoma, PE, CVA, hemorrhagic stroke, L4 fx, retinopathy, ACDF C3-5 02/09/23  Clinical Impression  Pt seen for PT evaluation with pt agreeable to tx, daughter present in room. Prior to admission pt was able to complete bed mobility & transfers to/from w/c without assistance, non-ambulatory, using w/c in the home, wife home with him 24/7. On this date, pt presents with decreased initiation, decreased sustained attention to task. Pt requires assistance to fully upright trunk to sit EOB, mod assist for successful squat pivot bed>recliner on L. Pt unable to extend BLE hips/knees in standing, maintains trunk flexion. Will continue to follow pt acutely to address strengthening, balance, to increase independence with transfers to reduce fall risk & caregiver burden.        If plan is discharge home, recommend the following: A lot of help with walking and/or transfers;A lot of help with bathing/dressing/bathroom;Assist for transportation;Assistance with cooking/housework;Help with stairs or ramp for entrance   Can travel by private vehicle        Equipment Recommendations None recommended by PT (pt declined hospital bed)  Recommendations for Other Services       Functional Status Assessment Patient has had a recent decline in their functional status and demonstrates the ability to make significant improvements in function in a reasonable and predictable amount of time.     Precautions / Restrictions Precautions Precautions: Fall Precaution/Restrictions Comments: seizures Restrictions Weight Bearing Restrictions Per Provider  Order: No      Mobility  Bed Mobility Overal bed mobility: Needs Assistance Bed Mobility: Rolling, Sidelying to Sit Rolling: Used rails, Supervision Sidelying to sit: Min assist, Used rails, HOB elevated (Pt able to upright trunk but requires assistance to fully upright.)       General bed mobility comments: Pt requires ongoing cuing for sustained attention to task.    Transfers Overall transfer level: Needs assistance Equipment used: None Transfers: Bed to chair/wheelchair/BSC       Squat pivot transfers: Mod assist     General transfer comment: bed>recliner on L with cuing re: hand placement, assistance with pivoting, pt with poor buttocks clearance, does not come to upright standing (keeps trunk flexed, BLE hips/knees flexed), requires assistance to safely sit on recliner as pt does not pivot fully to recliner.    Ambulation/Gait                  Stairs            Wheelchair Mobility     Tilt Bed    Modified Rankin (Stroke Patients Only)       Balance Overall balance assessment: Needs assistance Sitting-balance support: Feet supported Sitting balance-Leahy Scale: Fair Sitting balance - Comments: CGA static sitting EOB                                     Pertinent Vitals/Pain Pain Assessment Pain Assessment: No/denies pain    Home Living Family/patient expects to be discharged to:: Private residence Living Arrangements: Spouse/significant other Available Help at Discharge: Family;Available 24 hours/day Type of Home: House  Home Access: Ramped entrance       Home Layout: One level Home Equipment: Agricultural consultant (2 wheels);BSC/3in1;Shower seat - built in;Wheelchair - manual      Prior Function               Mobility Comments: non-ambulatory, independent for bed mobility, supervision for transfers to/from w/c, propels w/c in the home ADLs Comments: assistance for bathing, dressing     Extremity/Trunk Assessment    Upper Extremity Assessment Upper Extremity Assessment: Generalized weakness    Lower Extremity Assessment Lower Extremity Assessment: Generalized weakness (PT able to perform BLE knee PROM to almost full extension when pt sitting in recliner with BLE elevated)       Communication        Cognition Arousal: Alert Behavior During Therapy: Flat affect   PT - Cognitive impairments: Initiation                         Following commands: Impaired Following commands impaired: Only follows one step commands consistently, Follows one step commands with increased time     Cueing Cueing Techniques: Verbal cues, Tactile cues, Visual cues     General Comments      Exercises     Assessment/Plan    PT Assessment Patient needs continued PT services  PT Problem List Decreased strength;Decreased activity tolerance;Decreased balance;Decreased mobility;Decreased cognition;Decreased coordination       PT Treatment Interventions DME instruction;Balance training;Gait training;Neuromuscular re-education;Functional mobility training;Therapeutic activities;Therapeutic exercise;Manual techniques;Patient/family education;Modalities    PT Goals (Current goals can be found in the Care Plan section)  Acute Rehab PT Goals Patient Stated Goal: resume HHPT services PT Goal Formulation: With patient/family Time For Goal Achievement: 05/03/23 Potential to Achieve Goals: Good Additional Goals Additional Goal #1: Pt will propel w/c x 75 ft with mod I to increase independence with functional mobility.    Frequency Min 1X/week     Co-evaluation               AM-PAC PT "6 Clicks" Mobility  Outcome Measure Help needed turning from your back to your side while in a flat bed without using bedrails?: A Little Help needed moving from lying on your back to sitting on the side of a flat bed without using bedrails?: A Little Help needed moving to and from a bed to a chair (including a  wheelchair)?: A Lot Help needed standing up from a chair using your arms (e.g., wheelchair or bedside chair)?: Total Help needed to walk in hospital room?: Total Help needed climbing 3-5 steps with a railing? : Total 6 Click Score: 11    End of Session   Activity Tolerance: Patient tolerated treatment well Patient left: in chair;with chair alarm set;with call bell/phone within reach Nurse Communication: Mobility status PT Visit Diagnosis: Muscle weakness (generalized) (M62.81);Other abnormalities of gait and mobility (R26.89)    Time: 4782-9562 PT Time Calculation (min) (ACUTE ONLY): 18 min   Charges:   PT Evaluation $PT Eval Moderate Complexity: 1 Mod   PT General Charges $$ ACUTE PT VISIT: 1 Visit         Aleda Grana, PT, DPT 04/19/23, 2:04 PM   Sandi Mariscal 04/19/2023, 2:02 PM

## 2023-04-19 NOTE — TOC Initial Note (Signed)
Transition of Care [facility]) - Initial/Assessment Note    Patient Details  Name: Micheal Donovan MRN: [id number] Date of Birth: 16-Sep-1950  Transition of [facility]) CM/SW Contact:    Liliana Cline, LCSW Phone Number: 04/19/2023, 3:19 PM  Clinical Narrative:       CSW spoke with spouse due to patient not being fully oriented per chart. Patient lives at home with wife who provides transportation. Confirmed home address in chart is correct. PCP is Dr. Tanya Nones. Pharmacy is [facility] on [address] in [place]. Patient has a RW, 3in1, shower chair ,and wheelchair at home. PT recommends Home Health. Patient's wife states she is agreeable to home health, stated she does not want [facility]. She denied other agency preferences, she stated she reached out to Dr. Osborne [place] office last week to see if he could set up [facility] and is not sure if it has been arranged yet. CSW reached out to [employer] with [facility] since Dr. Osborne [place] office typically refers to them for [facility] - Silvio Pate is checking if they have this referral or can accept this patient. TOC handoff updated.            Expected Discharge Plan: Home w Home Health Services Barriers to Discharge: Continued Medical Work up   Patient Goals and CMS Choice Patient states their goals for this hospitalization and ongoing recovery are:: home with [facility] CMS Medicare.gov Compare Post Acute Care list provided to:: Patient Represenative (must comment) Choice offered to / list presented to : Spouse      Expected Discharge Plan and Services       Living arrangements for the past 2 months: Single Family Home                           HH Arranged: PT HH Agency: [facility] Date [facility] Agency Contacted: 04/19/23   Representative spoke with at [facility] Agency: Silvio Pate  Prior Living Arrangements/Services Living arrangements for the past 2 months: Single Family Home Lives with:: Spouse Patient language and need for interpreter reviewed:: Yes Do you feel safe going  back to the place where you live?: Yes      Need for Family Participation in Patient Care: Yes (Comment) Care giver support system in place?: Yes (comment) Current home services: DME Criminal Activity/Legal Involvement Pertinent to Current Situation/Hospitalization: No - Comment as needed  Activities of Daily Living      Permission Sought/Granted Permission sought to share information with : Facility Industrial/product designer granted to share information with : Yes, Verbal Permission Granted     Permission granted to share info w AGENCY: Home Health        Emotional Assessment       Orientation: : Fluctuating Orientation (Suspected and/or reported Sundowners) Alcohol / Substance Use: Not Applicable Psych Involvement: No (comment)  Admission diagnosis:  Seizure (HCC) [R56.9] Muscle spasm [M62.838] Altered mental status, unspecified altered mental status type [R41.82] Seizure disorder (HCC) [G40.909] Patient Active Problem List   Diagnosis Date Noted   Seizure disorder (HCC) 04/18/2023   Chronic diastolic CHF (congestive heart failure) (HCC) 04/17/2023   Depression with anxiety 04/17/2023   BPH (benign prostatic hyperplasia) 04/17/2023   UTI (urinary tract infection) 02/17/2023   Cervical myelopathy (HCC) 02/09/2023   Cervical spinal stenosis 02/09/2023   Malnutrition of moderate degree 10/03/2022   Seizure (HCC) 10/02/2022   Myocardial ischemia 10/02/2022   Weakness 10/01/2022   (HFpEF) heart failure with preserved ejection fraction (HCC)  10/01/2022   Sinusitis 10/01/2022   Neuropathy 09/08/2022   Hypokalemia 07/29/2022   Weight loss 07/07/2022   Weakness of left lower extremity 05/26/2022   Encounter for antineoplastic immunotherapy 05/05/2022   Orthostatic hypotension 04/05/2022   Elevated serum creatinine 03/18/2022   Chemotherapy induced neutropenia (HCC) 02/13/2022   Thrombocytosis 02/13/2022   Swelling of lower extremity 02/13/2022   Rhinovirus  infection 11/20/2021   Hypophosphatemia 11/20/2021   Anemia of chronic disease    Enteritis    Neutropenic fever (HCC) 11/18/2021   Essential hypertension 11/18/2021   Dyslipidemia 11/18/2021   Lower abdominal pain 11/18/2021   Frequent urination 11/11/2021   B12 deficiency anemia 09/27/2021   Back pain 08/16/2021   Folate deficiency anemia 08/16/2021   Metastasis to bone (HCC) 08/06/2021   Goals of care, counseling/discussion 07/23/2021   Pulmonary embolus (HCC) 07/23/2021   Encounter for antineoplastic chemotherapy 07/17/2021   Primary non-small cell carcinoma of upper lobe of left lung (HCC) 07/16/2021   Hemoptysis 07/16/2021   Type 2 diabetes mellitus with hyperlipidemia (HCC) 07/02/2021   Pressure injury of skin 06/21/2021   Headache 06/13/2021   Hyperlipidemia 06/12/2021   CAP (community acquired pneumonia) 06/12/2021   Cerebellar bleed (HCC)    Intraparenchymal hemorrhage of brain (HCC) 06/11/2021   Type 2 diabetes mellitus with hyperglycemia (HCC) 06/11/2021   Pseudohyponatremia 06/11/2021   Dehydration 06/11/2021   Leukocytosis 06/11/2021   Hypercalcemia 06/11/2021   Lumbar radiculopathy 03/15/2021   Lumbar pain 01/10/2020   Carotid stenosis, bilateral 11/07/2011   Type 2 diabetes mellitus without complications (HCC) 02/07/2010   HYPERCHOLESTEROLEMIA 02/07/2010   Benign hypertension 02/07/2010   CAROTID BRUIT, LEFT 02/07/2010   CHEST PAIN 02/07/2010   PCP:  Donita Brooks, MD Pharmacy:   [facility] [phone] - [place], [place] - [address]  [place] [address] Phone: [phone] Fax: [phone]     Social Drivers of Health (SDOH) Social History: SDOH Screenings   Food Insecurity: No Food Insecurity (04/19/2023)  Housing: Low Risk  (04/19/2023)  Transportation Needs: No Transportation Needs (04/19/2023)  Utilities: Patient Unable To Answer (04/19/2023)  Alcohol Screen: Low Risk   (03/27/2022)  Depression (PHQ2-9): High Risk (04/21/2022)  Financial Resource Strain: Low Risk  (04/17/2022)  Physical Activity: Inactive (03/27/2022)  Social Connections: Patient Unable To Answer (04/19/2023)  Stress: Stress Concern Present (03/27/2022)  Tobacco Use: Medium Risk (04/17/2023)   SDOH Interventions:     Readmission Risk Interventions    11/20/2021   10:17 AM  Readmission Risk Prevention Plan  Transportation Screening Complete  PCP or Specialist Appt within 3-5 Days Complete  HRI or Home Care Consult --  Social Work Consult for Recovery Care Planning/Counseling --  Palliative Care Screening Not Applicable  Medication Review Oceanographer) Complete

## 2023-04-19 NOTE — Progress Notes (Signed)
[facility] Progress Note  Patient: Micheal Donovan    EGB:[id number]  DOA: 04/17/2023     Date of Service: the patient was seen and examined on 04/19/2023  No chief complaint on file.  Brief hospital course: Micheal Donovan is a 73 y.o. male with medical history significant of non-small cell lung cancer on Keytruda, absence seizure not taking medications currently, hypertension, hyperlipidemia, diabetes mellitus, diastolic CHF, stroke, depression with anxiety, BPH, melanoma, PE not on anticoagulants (finished treatment), who presents with twitching in right leg and arm.   Per her daughter and the wife at bedside, pt started having right leg and arm "twitch" that comes and goes since this morning.  No weakness or tingling in extremities.  No facial droop or slurred speech.  Patient did not have confusion or altered mental status at home, but became confused after having received 3 doses of total 2.5 mg of Ativan in ED.  No more twitching in ED.  Patient does not have chest pain, cough, SOB.  No nausea, vomiting, diarrhea or abdominal pain.  Not sure if patient has symptoms of UTI.  Of note, patient had urinalysis by his doctor yesterday which was positive for UTI.  UA on 04/16/2023 showed cloudy appearance, 3+ leukocytes, positive nitrite, many bacteria, WBC> 30.  Per his wife, patient has history of absence seizure, used to be on Keppra, but patient himself stopped taking Keppra for more than 3 months.   Data reviewed independently and ED Course: pt was found to have WBC 7.3, CK 72, GFR> 60, temperature normal, blood pressure 134/56, heart rate 71, RR 16, oxygen saturation 98% on room air.  Patient is placed on telemetry bed for observation.   MRI of brain: Severely motion limited study and incomplete study without obvious acute abnormality. Of note, postcontrast imaging was not performed and metastatic disease cannot be excluded.     EKG: I have personally reviewed.  Sinus rhythm, QTc 499,  bifascicular block  Assessment and Plan:  # Seizure: Patient symptoms are likely due to seizure.  Patient has history of seizure, but stopped taking Keppra for more than 3 months.  He has UTI which likely lowered threshold of seizure.   MRI of the brain is limited study, but did not show obvious abnormalities. Continue seizure precaution -As needed Ativan for seizure -Load Keppra 1000 mg by IV -Restart Keppra 500 mg BID PO vs IV if patient is unable to swallow -As needed Robaxin -Fall precaution -Frequent neurocheck Follow EEG Neurology consulted, MRI repeated, negative for any acute findings.  EEG tomorrow a.m., continue Keppra 500 twice daily    # UTI (urinary tract infection) -IV Rocephin -Repeated urine analysis, and follow-up urine culture   # Primary non-small cell carcinoma of upper lobe of left lung -Patient is on Keytruda currently -Follow-up with Dr. Cathie Hoops of oncology   # Type 2 diabetes mellitus without complications: Recent A1c 6.6, well-controlled.  Patient taking NovoLog and as needed Lantus -Sliding scale insulin -Glargine insulin 5 units daily   # Essential hypertension:  Patient's not taking his Diovan currently per his wife.   S/p Replacement with Diovan with irbesartan 75 mg p.o. daily, d/c'd on 2/16  -IV hydralazine as needed 2/16 Started midodrine 10 mg p.o. 3 times daily with holding parameters NS bolus 500 mL given Monitor BP and titrate medication accordingly  # Chronic diastolic CHF (congestive heart failure) (HCC): 2D echo on 06/18/2022 showed EF 55-60% with grade 1 diastolic dysfunction.  Patient does not  have leg edema or JVD.  No oxygen desaturation.  CHF seem to be compensated.  BNP --> 245   # BPH (benign prostatic hyperplasia) -Flomax and Proscar   # Depression with anxiety -Continue home medications     Body mass index is 19.61 kg/m.  Interventions:  Pressure Injury 02/09/23 Sacrum Medial;Lower Stage 2 -  Partial thickness loss of  dermis presenting as a shallow open injury with a red, pink wound bed without slough. pink, open sore (Active)  02/09/23 1400  Location: Sacrum  Location Orientation: Medial;Lower  Staging: Stage 2 -  Partial thickness loss of dermis presenting as a shallow open injury with a red, pink wound bed without slough.  Wound Description (Comments): pink, open sore  Present on Admission: Yes  Dressing Type Foam - Lift dressing to assess site every shift 04/18/23 1100     Diet: Dysphagia 2 diet DVT Prophylaxis: Subcutaneous Lovenox   Advance goals of care discussion: Full code  Family Communication: family was present at bedside, at the time of interview.  The pt provided permission to discuss medical plan with the family. Opportunity was given to ask question and all questions were answered satisfactorily.   Disposition:  Pt is from home, admitted with seizures, still has risk of seizures, mental status is not back to baseline, which precludes a safe discharge. Discharge to home, when cleared by neurology.  Subjective: No significant events overnight, patient is awake and alert today, AO x 3, denied any seizure activity during hospital stay, slept well last night. Patient has not ambulated yet, normally he is wheelchair-bound can transfer himself.  Patient is motivated and feels strong enough to do some activities. Awaiting for urine culture final report and PT and OT eval for disposition plan most likely tomorrow a.m.   Physical Exam: General: NAD, lying comfortably Appear in no distress, affect appropriate Eyes: PERRLA ENT: Oral Mucosa Clear, moist  Neck: no JVD,  Cardiovascular: S1 and S2 Present, no Murmur,  Respiratory: good respiratory effort, Bilateral Air entry equal and Decreased, no Crackles, no wheezes Abdomen: Bowel Sound present, Soft and no tenderness,  Skin: no rashes Extremities: no Pedal edema, no calf tenderness Neurologic: without any new focal findings Gait not  checked due to patient safety concerns  Vitals:   04/18/23 1609 04/18/23 2051 04/19/23 0402 04/19/23 0934  BP: (!) 88/50 112/65 (!) 95/54 (!) 92/45  Pulse:  80 74 66  Resp: 16 16 16 18   Temp: 97.8 F (36.6 C) 98 F (36.7 C) 98.4 F (36.9 C) 98 F (36.7 C)  TempSrc:  Oral Oral Oral  SpO2: 99% 98% 98% 99%  Weight:      Height:        Intake/Output Summary (Last 24 hours) at 04/19/2023 1600 Last data filed at 04/19/2023 1030 Gross per 24 hour  Intake 839.11 ml  Output 1600 ml  Net -760.89 ml   Filed Weights   04/17/23 1636 04/18/23 0131  Weight: 57.1 kg 56.8 kg    Data Reviewed: I have personally reviewed and interpreted daily labs, tele strips, imagings as discussed above. I reviewed all nursing notes, pharmacy notes, vitals, pertinent old records I have discussed plan of care as described above with RN and patient/family.  CBC: Recent Labs  Lab 04/17/23 1352 04/18/23 0526 04/19/23 0648  WBC 7.3 7.3 5.1  NEUTROABS 5.8  --   --   HGB 12.2* 13.2 11.4*  HCT 37.6* 39.8 33.9*  MCV 91.9 90.2 89.2  PLT 242 207  229   Basic Metabolic Panel: Recent Labs  Lab 04/17/23 1352 04/18/23 0848 04/19/23 0648  NA 137 138 136  K 3.8 3.5 3.3*  CL 103 108 106  CO2 22 21* 23  GLUCOSE 193* 133* 98  BUN 16 13 15   CREATININE 0.95 0.90 0.92  CALCIUM 9.3 9.1 9.0  MG 2.3 2.3 2.2  PHOS 2.7 2.7 3.3    Studies: MR BRAIN W WO CONTRAST Result Date: 04/18/2023 CLINICAL DATA:  Seizure, new-onset, no history of trauma repeat MRI brain wwo, 1st was nondiagnostic 2/2 movement EXAM: MRI HEAD WITHOUT AND WITH CONTRAST TECHNIQUE: Multiplanar, multiecho pulse sequences of the brain and surrounding structures were obtained without and with intravenous contrast. CONTRAST:  5mL GADAVIST GADOBUTROL 1 MMOL/ML IV SOLN COMPARISON:  MRI February 14, 25. 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 No evidence of acute infarct, acute hemorrhage, mass lesion, midline shift or  hydrocephalus. No pathologic enhancement. No hydrocephalus. Vascular: Abnormal left intradural vertebral artery flow void, compatible with stenosis seen on prior CTA. Major arterial flow voids are maintained at the skull base. Skull and upper cervical spine: Normal marrow signal. Sinuses/Orbits: Mostly clear sinuses.  No acute orbital findings. Other: No mastoid effusions. IMPRESSION: 1. No evidence of acute intracranial abnormality or metastatic disease. 2. 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 Electronically Signed   By: Feliberto Harts M.D.   On: 04/18/2023 21:53    Scheduled Meds:  citalopram  10 mg Oral Daily   fentaNYL  1 patch Transdermal Q3 days   finasteride  5 mg Oral Daily   gabapentin  600 mg Oral BID   insulin aspart  0-5 Units Subcutaneous QHS   insulin aspart  0-9 Units Subcutaneous TID WC   insulin glargine-yfgn  5 Units Subcutaneous QHS   levETIRAcetam  500 mg Oral BID   pantoprazole  40 mg Oral Daily   tamsulosin  0.4 mg Oral Daily   Continuous Infusions:  cefTRIAXone (ROCEPHIN)  IV 1 g (04/18/23 2202)   levETIRAcetam     PRN Meds: acetaminophen, ALPRAZolam, hydrALAZINE, LORazepam, methocarbamol, midodrine, ondansetron (ZOFRAN) IV, oxyCODONE-acetaminophen, senna  Time spent: 55 minutes  Author: Gillis Santa. MD [facility] 04/19/2023 4:00 PM  To reach On-call, see care teams to locate the attending and reach out to them via [email or web address]. If 7PM-7AM, please contact night-coverage If you still have difficulty reaching the attending provider, please page the [facility] (Director on Call) for [facility] on amion for assistance.

## 2023-04-19 NOTE — Plan of Care (Signed)
  Problem: Fluid Volume: Goal: Ability to maintain a balanced intake and output will improve Outcome: Progressing   Problem: Nutritional: Goal: Maintenance of adequate nutrition will improve Outcome: Progressing   Problem: Health Behavior/Discharge Planning: Goal: Ability to manage health-related needs will improve Outcome: Not Progressing   Problem: Skin Integrity: Goal: Risk for impaired skin integrity will decrease Outcome: Not Progressing   Problem: Education: Goal: Knowledge of General Education information will improve Description: Including pain rating scale, medication(s)/side effects and non-pharmacologic comfort measures Outcome: Not Progressing   Problem: Health Behavior/Discharge Planning: Goal: Ability to manage health-related needs will improve Outcome: Not Progressing   Problem: Clinical Measurements: Goal: Respiratory complications will improve Outcome: Not Progressing   Problem: Activity: Goal: Risk for activity intolerance will decrease Outcome: Not Progressing

## 2023-04-20 ENCOUNTER — Inpatient Hospital Stay: Payer: PPO | Attending: Radiation Oncology | Admitting: Hospice and Palliative Medicine

## 2023-04-20 ENCOUNTER — Telehealth: Payer: Self-pay | Admitting: Oncology

## 2023-04-20 ENCOUNTER — Other Ambulatory Visit: Payer: Self-pay | Admitting: Oncology

## 2023-04-20 ENCOUNTER — Ambulatory Visit: Payer: Self-pay | Admitting: Urology

## 2023-04-20 DIAGNOSIS — C3412 Malignant neoplasm of upper lobe, left bronchus or lung: Secondary | ICD-10-CM

## 2023-04-20 DIAGNOSIS — R569 Unspecified convulsions: Secondary | ICD-10-CM | POA: Diagnosis not present

## 2023-04-20 LAB — BASIC METABOLIC PANEL
Anion gap: 8 (ref 5–15)
BUN: 19 mg/dL (ref 8–23)
CO2: 23 mmol/L (ref 22–32)
Calcium: 8.5 mg/dL — ABNORMAL LOW (ref 8.9–10.3)
Chloride: 102 mmol/L (ref 98–111)
Creatinine, Ser: 1.24 mg/dL (ref 0.61–1.24)
GFR, Estimated: >60 mL/min (ref >60)
Glucose, Bld: 121 mg/dL — ABNORMAL HIGH (ref 70–99)
Potassium: 3.8 mmol/L (ref 3.5–5.1)
Sodium: 133 mmol/L — ABNORMAL LOW (ref 135–145)

## 2023-04-20 LAB — CBC
HCT: 36.2 % — ABNORMAL LOW (ref 39.0–52.0)
Hemoglobin: 12 g/dL — ABNORMAL LOW (ref 13.0–17.0)
MCH: 29.9 pg (ref 26.0–34.0)
MCHC: 33.1 g/dL (ref 30.0–36.0)
MCV: 90.3 fL (ref 80.0–100.0)
Platelets: 251 10*3/uL (ref 150–400)
RBC: 4.01 MIL/uL — ABNORMAL LOW (ref 4.22–5.81)
RDW: 12.9 % (ref 11.5–15.5)
WBC: 5.4 10*3/uL (ref 4.0–10.5)
nRBC: 0 % (ref 0.0–0.2)

## 2023-04-20 LAB — GLUCOSE, CAPILLARY
Glucose-Capillary: 81 mg/dL (ref 70–99)
Glucose-Capillary: 65 mg/dL — ABNORMAL LOW (ref 70–99)
Glucose-Capillary: 101 mg/dL — ABNORMAL HIGH (ref 70–99)
Glucose-Capillary: 127 mg/dL — ABNORMAL HIGH (ref 70–99)
Glucose-Capillary: 192 mg/dL — ABNORMAL HIGH (ref 70–99)

## 2023-04-20 LAB — MAGNESIUM: Magnesium: 2 mg/dL (ref 1.7–2.4)

## 2023-04-20 LAB — PHOSPHORUS: Phosphorus: 3.8 mg/dL (ref 2.5–4.6)

## 2023-04-20 MED ORDER — SODIUM CHLORIDE 0.9 % IV BOLUS
500.0000 mL | Freq: Once | INTRAVENOUS | Status: AC
  Administered 2023-04-20: 500 mL via INTRAVENOUS

## 2023-04-20 MED ORDER — SODIUM CHLORIDE 0.9 % IV SOLN
2.0000 g | Freq: Two times a day (BID) | INTRAVENOUS | Status: DC
  Administered 2023-04-20 – 2023-04-21 (×3): 2 g via INTRAVENOUS
  Filled 2023-04-20 (×4): qty 12.5

## 2023-04-20 NOTE — Evaluation (Signed)
Occupational Therapy Evaluation Patient Details Name: Micheal Donovan MRN: [id number] DOB: [birth year] Today's Date: 04/20/2023   History of Present Illness   Pt is a 73 y/o M admitted on 04/17/23 after presenting with c/o twitching in R leg & arm. Pt is being treated for a seizure. PMH: non-small cell lung CA, absence seizure, HTN, HLD, DM, diastolic CHF, depression with anxiety, BPH, melanoma, PE, CVA, hemorrhagic stroke, L4 fx, retinopathy, ACDF C3-5 02/09/23     Clinical Impressions Pt in bed upon OT arrival with daughter present for duration of session; both agreeable to OT eval.  Daughter reports that pt was recently discharged from [facility] OT, but had still been working with [facility] PT, and they plan to restart [facility] PT upon hospital d/c.  Daughter feels pt is near baseline status with ADLs and declines need for continued OT upon d/c, but agreeable to acute OT to maximize ADL potential and prevent functional decline while hospitalized.  Min guard for static and dynamic sitting balance EOB.  Pt able to manage bed mobility with extra time and min vc, and perform a step pivot transfer from bed to recliner with min A.  OT will plan to focus on safety with [facility] transfers while hospitalized as daughter confirmed pt was non-ambulatory at baseline.  Pt able to return demo of ankle pumps while up in chair;  OT recommendation to complete hourly to promote BLE circulation.  All necessary items placed within reach.  Will continue to follow in the acute setting.      If plan is discharge home, recommend the following:   A lot of help with bathing/dressing/bathroom;Assistance with cooking/housework;Direct supervision/assist for medications management;Assist for transportation;Supervision due to cognitive status;A little help with walking and/or transfers;Help with stairs or ramp for entrance     Functional Status Assessment   Patient has had a recent decline in their functional status and demonstrates the ability to  make significant improvements in function in a reasonable and predictable amount of time.     Equipment Recommendations   None recommended by OT     Recommendations for Other Services         Precautions/Restrictions   Precautions Precautions: Fall Precaution/Restrictions Comments: seizures Restrictions Weight Bearing Restrictions Per Provider Order: No     Mobility Bed Mobility Overal bed mobility: Needs Assistance Bed Mobility: Rolling, Sidelying to Sit Rolling: Used rails, Supervision Sidelying to sit: Supervision, Used rails, HOB elevated       General bed mobility comments: increased time to transition to midline sitting Patient Response: Cooperative  Transfers Overall transfer level: Needs assistance Equipment used: None Transfers: Bed to chair/wheelchair/BSC       Step pivot transfers: Min assist     General transfer comment: Anticipated squat pivot as pt reached for arm rests of recliner from bedside, but pt was able to achieve nearly a full standing position transitioning arms to therapist for support to complete a step pivot transfer with min A.      Balance Overall balance assessment: Needs assistance Sitting-balance support: Feet supported, Single extremity supported Sitting balance-Leahy Scale: Fair Sitting balance - Comments: able to reach to mid lower legs in sitting but not to feet.  1 hand on support surface when reaching.   Standing balance support: Bilateral upper extremity supported, Reliant on assistive device for balance Standing balance-Leahy Scale: Poor  ADL either performed or assessed with clinical judgement   ADL Overall ADL's : Needs assistance/impaired                     Lower Body Dressing: Maximal assistance;Sitting/lateral leans Lower Body Dressing Details (indicate cue type and reason): unable to reach to feet in sitting Toilet Transfer: Minimal assistance;BSC/3in1            Functional mobility during ADLs: Minimal assistance General ADL Comments: Able to step pivot bed to recliner with min A; pt preferred to reach to arm rests of recliner for support rather than use walker. Agreeable to sit up for lunch                                Pertinent Vitals/Pain Pain Assessment Pain Assessment: 0-10 Pain Score: 2  Pain Location: mild back pain when initiating bed mobility, but resolved in sitting Pain Descriptors / Indicators: Aching Pain Intervention(s): Limited activity within patient's tolerance, Monitored during session, Repositioned     Extremity/Trunk Assessment Upper Extremity Assessment Upper Extremity Assessment: Overall WFL for tasks assessed   Lower Extremity Assessment Lower Extremity Assessment: Generalized weakness       Communication Communication Communication: No apparent difficulties   Cognition Arousal: Alert                 OT - Cognition Comments: oriented to place and situation                   Following commands impaired: Only follows one step commands consistently, Follows one step commands with increased time     Cueing  General Comments   Cueing Techniques: Verbal cues;Tactile cues;Visual cues      Exercises Other Exercises Other Exercises: Ankle pumps x~20 reps; encouraged completion hourly to promote BLE circulation   Shoulder Instructions      Home Living Family/patient expects to be discharged to:: Private residence Living Arrangements: Spouse/significant other Available Help at Discharge: Family;Available 24 hours/day Type of Home: House Home Access: Ramped entrance     Home Layout: One level     Bathroom Shower/Tub: Producer, television/film/video: Handicapped height Bathroom Accessibility: Yes   Home Equipment: Agricultural consultant (2 wheels);BSC/3in1;Shower seat - built in;Wheelchair - manual   Additional Comments: Pt has caretaker 8 hours a day M-F.      Prior  Functioning/Environment Prior Level of Function : Needs assist       Physical Assist : Mobility (physical);ADLs (physical) Mobility (physical): Transfers;Gait ADLs (physical): Bathing;Dressing Mobility Comments: non-ambulatory, independent for bed mobility, supervision for transfers to/from w/c, propels w/c in the home ADLs Comments: assistance for bathing, dressing    OT Problem List: Decreased activity tolerance;Decreased cognition;Impaired balance (sitting and/or standing);Decreased safety awareness   OT Treatment/Interventions: Self-care/ADL training;Energy conservation;Balance training;Therapeutic exercise;DME and/or AE instruction;Therapeutic activities;Patient/family education      OT Goals(Current goals can be found in the care plan section)   Acute Rehab OT Goals Patient Stated Goal: To go home with [facility] PT and continued caregiver support 24/7 OT Goal Formulation: With patient/family Time For Goal Achievement: 05/04/23 Potential to Achieve Goals: Good ADL Goals Pt Will Transfer to Toilet: with supervision;bedside commode Pt Will Perform Toileting - Clothing Manipulation and hygiene: with min assist;sitting/lateral leans   OT Frequency:  Min 1X/week                  AM-PAC OT "6  Clicks" Daily Activity     Outcome Measure Help from another person eating meals?: A Little Help from another person taking care of personal grooming?: A Little Help from another person toileting, which includes using toliet, bedpan, or urinal?: A Little Help from another person bathing (including washing, rinsing, drying)?: A Lot Help from another person to put on and taking off regular upper body clothing?: A Little Help from another person to put on and taking off regular lower body clothing?: A Lot 6 Click Score: 16   End of Session Equipment Utilized During Treatment: Gait belt  Activity Tolerance: Patient tolerated treatment well Patient left: in chair;with call bell/phone within  reach;with chair alarm set;with family/visitor present  OT Visit Diagnosis: Unsteadiness on feet (R26.81);Other abnormalities of gait and mobility (R26.89);Muscle weakness (generalized) (M62.81)                Time: 1610-9604 OT Time Calculation (min): 19 min Charges:  OT General Charges $OT Visit: 1 Visit OT Evaluation $OT Eval Moderate Complexity: 1 Mod Danelle Earthly, MS, OTR/L [place] Dials 04/20/2023, 12:46 PM

## 2023-04-20 NOTE — Telephone Encounter (Signed)
Pt wife called and stated he was still in the hospital. He was supposed to be discharged today but they decided not to. She called to cancel tx appts for tomorrow.

## 2023-04-20 NOTE — Plan of Care (Signed)
  Problem: Health Behavior/Discharge Planning: Goal: Ability to identify and utilize available resources and services will improve Outcome: Progressing   Problem: Nutritional: Goal: Maintenance of adequate nutrition will improve Outcome: Progressing   Problem: Skin Integrity: Goal: Risk for impaired skin integrity will decrease Outcome: Progressing   Problem: Tissue Perfusion: Goal: Adequacy of tissue perfusion will improve Outcome: Progressing   Problem: Pain Managment: Goal: General experience of comfort will improve and/or be controlled Outcome: Progressing   Problem: Safety: Goal: Ability to remain free from injury will improve Outcome: Progressing   Problem: Skin Integrity: Goal: Risk for impaired skin integrity will decrease Outcome: Progressing

## 2023-04-20 NOTE — Inpatient Diabetes Management (Signed)
Inpatient Diabetes Program Recommendations  AACE/ADA: New Consensus Statement on Inpatient Glycemic Control   Target Ranges:  Prepandial:   less than 140 mg/dL      Peak postprandial:   less than 180 mg/dL (1-2 hours)      Critically ill patients:  140 - 180 mg/dL    Latest Reference Range & Units 04/19/23 08:32 04/19/23 10:30 04/19/23 15:44 04/19/23 21:33 04/19/23 21:48 04/20/23 07:51 04/20/23 08:32  Glucose-Capillary 70 - 99 mg/dL 91 782 (H) 956 (H) 213 (H) 106 (H) 81 65 (L)   Review of Glycemic Control  Diabetes history: DM2 Outpatient Diabetes medications: Lantus 0-30 units as needed if CBG >150 mg/dl Current orders for Inpatient glycemic control: Semglee 5 units at bedtime, Novolog 0-9 units TID with meals, Novolog 0-5 units QHS  Inpatient Diabetes Program Recommendations:    Insulin: CBG 65 mg/dl at 0:86 am today. Please discontinue Semglee and use Novolog correction for glycemic control at this time.  Thanks, Orlando Penner, RN, MSN, CDCES Diabetes Coordinator Inpatient Diabetes Program [phone] (Team Pager from 8am to 5pm)

## 2023-04-20 NOTE — Progress Notes (Signed)
[facility] Progress Note  Patient: Micheal Donovan    ZOX:[id number]  DOA: 04/17/2023     Date of Service: the patient was seen and examined on 04/20/2023  No chief complaint on file.  Brief hospital course: Micheal Donovan is a 73 y.o. male with medical history significant of non-small cell lung cancer on Keytruda, absence seizure not taking medications currently, hypertension, hyperlipidemia, diabetes mellitus, diastolic CHF, stroke, depression with anxiety, BPH, melanoma, PE not on anticoagulants (finished treatment), who presents with twitching in right leg and arm.   Per her daughter and the wife at bedside, Micheal Donovan started having right leg and arm "twitch" that comes and goes since this morning.  No weakness or tingling in extremities.  No facial droop or slurred speech.  Patient did not have confusion or altered mental status at home, but became confused after having received 3 doses of total 2.5 mg of Ativan in ED.  No more twitching in ED.  Patient does not have chest pain, cough, SOB.  No nausea, vomiting, diarrhea or abdominal pain.  Not sure if patient has symptoms of UTI.  Of note, patient had urinalysis by his doctor yesterday which was positive for UTI.  UA on 04/16/2023 showed cloudy appearance, 3+ leukocytes, positive nitrite, many bacteria, WBC> 30.  Per his wife, patient has history of absence seizure, used to be on Keppra, but patient himself stopped taking Keppra for more than 3 months.   Data reviewed independently and ED Course: Micheal Donovan was found to have WBC 7.3, CK 72, GFR> 60, temperature normal, blood pressure 134/56, heart rate 71, RR 16, oxygen saturation 98% on room air.  Patient is placed on telemetry bed for observation.   MRI of brain: Severely motion limited study and incomplete study without obvious acute abnormality. Of note, postcontrast imaging was not performed and metastatic disease cannot be excluded.     EKG: I have personally reviewed.  Sinus rhythm, QTc 499,  bifascicular block  Assessment and Plan:  # Seizure: Patient symptoms are likely due to seizure.  Patient has history of seizure, but stopped taking Keppra for more than 3 months.  He has UTI which likely lowered threshold of seizure.   MRI of the brain is limited study, but did not show obvious abnormalities. Continue seizure precaution -As needed Ativan for seizure -Load Keppra 1000 mg by IV -Restart Keppra 500 mg BID PO vs IV if patient is unable to swallow -As needed Robaxin Continue fall precaution S/p Frequent neurochecks, remained stable  Neurology consulted, MRI repeated, negative for any acute findings.  continue Keppra 500 twice daily No need for EEG   # UTI (urinary tract infection) -s/p IV Rocephin UA positive, urine culture growing Pseudomonas, sensitivity pending 2/17 started cefepime 2 g every 12 hourly, discussed with pharmacy    # Primary non-small cell carcinoma of upper lobe of left lung -Patient is on Keytruda currently -Follow-up with Dr. Cathie Hoops of oncology   # Type 2 diabetes mellitus without complications: Recent A1c 6.6, well-controlled.  Patient taking NovoLog and as needed Lantus -Continue sliding scale insulin S/p Semglee 5 u at bedtime, DC'd on 2/17 due to hypoglycemia Continue hypoglycemia protocol and monitor CBG   # Essential hypertension:  Patient's not taking his Diovan currently per his wife.   S/p Replacement with Diovan with irbesartan 75 mg p.o. daily, d/c'd on 2/16  -IV hydralazine as needed 2/16 Started midodrine 10 mg p.o. 3 times daily with holding parameters NS bolus 500 mL given  2/17 NS 500 mL bolus given Monitor BP and titrate medication accordingly  # Chronic diastolic CHF (congestive heart failure) (HCC): 2D echo on 06/18/2022 showed EF 55-60% with grade 1 diastolic dysfunction.  Patient does not have leg edema or JVD.  No oxygen desaturation.  CHF seem to be compensated.  BNP --> 245   # BPH (benign prostatic hyperplasia) -Flomax  and Proscar   # Depression with anxiety -Continue home medications   Body mass index is 21.06 kg/m.  Interventions:  Pressure Injury 02/09/23 Sacrum Medial;Lower Stage 2 -  Partial thickness loss of dermis presenting as a shallow open injury with a red, pink wound bed without slough. pink, open sore (Active)  02/09/23 1400  Location: Sacrum  Location Orientation: Medial;Lower  Staging: Stage 2 -  Partial thickness loss of dermis presenting as a shallow open injury with a red, pink wound bed without slough.  Wound Description (Comments): pink, open sore  Present on Admission: Yes  Dressing Type Foam - Lift dressing to assess site every shift 04/19/23 2100     Diet: Dysphagia 2 diet DVT Prophylaxis: Subcutaneous Lovenox   Advance goals of care discussion: Full code  Family Communication: family was present at bedside, at the time of interview.  The Micheal Donovan provided permission to discuss medical plan with the family. Opportunity was given to ask question and all questions were answered satisfactorily.   Disposition:  Micheal Donovan is from home, admitted with seizures, still has risk of seizures, mental status is not back to baseline, which precludes a safe discharge. Discharge to home, when cleared by neurology.  Subjective: No significant events overnight, patient is awake and alert, slept well, denied any seizure activity.  No new specific complaints.  Resting comfortably. Patient was hypoglycemic in the morning blood sugar is improved now.  Patient denied any complaints.   Physical Exam: General: NAD, lying comfortably Appear in no distress, affect appropriate Eyes: PERRLA ENT: Oral Mucosa Clear, moist  Neck: no JVD,  Cardiovascular: S1 and S2 Present, no Murmur,  Respiratory: Equal air entry bilaterally, no wheezes or crackles. Abdomen: Bowel Sound present, Soft and no tenderness,  Skin: no rashes Extremities: no Pedal edema, no calf tenderness Neurologic: without any new focal  findings Gait not checked due to patient safety concerns  Vitals:   04/20/23 0353 04/20/23 0356 04/20/23 0753 04/20/23 0831  BP: (!) 92/46  (!) 92/50 (!) 97/53  Pulse: 70  78 72  Resp: 18  17 18   Temp: 98.8 F (37.1 C)  (!) 97.5 F (36.4 C) 98.1 F (36.7 C)  TempSrc: Oral   Oral  SpO2: 100%  99% 100%  Weight:  61 kg    Height:        Intake/Output Summary (Last 24 hours) at 04/20/2023 1351 Last data filed at 04/20/2023 0300 Gross per 24 hour  Intake 599.5 ml  Output 1200 ml  Net -600.5 ml   Filed Weights   04/17/23 1636 04/18/23 0131 04/20/23 0356  Weight: 57.1 kg 56.8 kg 61 kg    Data Reviewed: I have personally reviewed and interpreted daily labs, tele strips, imagings as discussed above. I reviewed all nursing notes, pharmacy notes, vitals, pertinent old records I have discussed plan of care as described above with RN and patient/family.  CBC: Recent Labs  Lab 04/17/23 1352 04/18/23 0526 04/19/23 0648 04/20/23 1017  WBC 7.3 7.3 5.1 5.4  NEUTROABS 5.8  --   --   --   HGB 12.2* 13.2 11.4* 12.0*  HCT 37.6* 39.8 33.9* 36.2*  MCV 91.9 90.2 89.2 90.3  PLT 242 207 229 251   Basic Metabolic Panel: Recent Labs  Lab 04/17/23 1352 04/18/23 0848 04/19/23 0648 04/20/23 1017  NA 137 138 136 133*  K 3.8 3.5 3.3* 3.8  CL 103 108 106 102  CO2 22 21* 23 23  GLUCOSE 193* 133* 98 121*  BUN 16 13 15 19   CREATININE 0.95 0.90 0.92 1.24  CALCIUM 9.3 9.1 9.0 8.5*  MG 2.3 2.3 2.2 2.0  PHOS 2.7 2.7 3.3 3.8    Studies: No results found.   Scheduled Meds:  citalopram  10 mg Oral Daily   enoxaparin (LOVENOX) injection  40 mg Subcutaneous QPM   fentaNYL  1 patch Transdermal Q3 days   finasteride  5 mg Oral Daily   gabapentin  600 mg Oral BID   insulin aspart  0-5 Units Subcutaneous QHS   insulin aspart  0-9 Units Subcutaneous TID WC   levETIRAcetam  500 mg Oral BID   midodrine  10 mg Oral TID WC   pantoprazole  40 mg Oral Daily   tamsulosin  0.4 mg Oral Daily    Continuous Infusions:  ceFEPime (MAXIPIME) IV 2 g (04/20/23 1307)   levETIRAcetam     PRN Meds: acetaminophen, ALPRAZolam, hydrALAZINE, LORazepam, methocarbamol, ondansetron (ZOFRAN) IV, oxyCODONE-acetaminophen, senna  Time spent: 55 minutes  Author: Gillis Santa. MD [facility] 04/20/2023 1:51 PM  To reach On-call, see care teams to locate the attending and reach out to them via [email or web address]. If 7PM-7AM, please contact night-coverage If you still have difficulty reaching the attending provider, please page the [facility] (Director on Call) for [facility] on amion for assistance.

## 2023-04-21 ENCOUNTER — Encounter: Payer: Self-pay | Admitting: Oncology

## 2023-04-21 ENCOUNTER — Other Ambulatory Visit: Payer: Self-pay

## 2023-04-21 ENCOUNTER — Inpatient Hospital Stay: Payer: PPO | Admitting: Oncology

## 2023-04-21 ENCOUNTER — Inpatient Hospital Stay: Payer: PPO

## 2023-04-21 DIAGNOSIS — R569 Unspecified convulsions: Secondary | ICD-10-CM | POA: Diagnosis not present

## 2023-04-21 LAB — URINE CULTURE

## 2023-04-21 LAB — BASIC METABOLIC PANEL
Anion gap: 8 (ref 5–15)
BUN: 19 mg/dL (ref 8–23)
CO2: 22 mmol/L (ref 22–32)
Calcium: 8.7 mg/dL — ABNORMAL LOW (ref 8.9–10.3)
Chloride: 105 mmol/L (ref 98–111)
Creatinine, Ser: 1.04 mg/dL (ref 0.61–1.24)
GFR, Estimated: >60 mL/min (ref >60)
Glucose, Bld: 131 mg/dL — ABNORMAL HIGH (ref 70–99)
Potassium: 3.7 mmol/L (ref 3.5–5.1)
Sodium: 135 mmol/L (ref 135–145)

## 2023-04-21 LAB — CBC
HCT: 33.2 % — ABNORMAL LOW (ref 39.0–52.0)
Hemoglobin: 11 g/dL — ABNORMAL LOW (ref 13.0–17.0)
MCH: 29.9 pg (ref 26.0–34.0)
MCHC: 33.1 g/dL (ref 30.0–36.0)
MCV: 90.2 fL (ref 80.0–100.0)
Platelets: 227 10*3/uL (ref 150–400)
RBC: 3.68 MIL/uL — ABNORMAL LOW (ref 4.22–5.81)
RDW: 12.8 % (ref 11.5–15.5)
WBC: 4.7 10*3/uL (ref 4.0–10.5)
nRBC: 0 % (ref 0.0–0.2)

## 2023-04-21 LAB — GLUCOSE, CAPILLARY
Glucose-Capillary: 124 mg/dL — ABNORMAL HIGH (ref 70–99)
Glucose-Capillary: 141 mg/dL — ABNORMAL HIGH (ref 70–99)

## 2023-04-21 LAB — PHOSPHORUS: Phosphorus: 3.4 mg/dL (ref 2.5–4.6)

## 2023-04-21 LAB — MAGNESIUM: Magnesium: 2.1 mg/dL (ref 1.7–2.4)

## 2023-04-21 LAB — LEVETIRACETAM LEVEL: Levetiracetam Lvl: 27.4 ug/mL (ref 10.0–40.0)

## 2023-04-21 MED ORDER — CIPROFLOXACIN HCL 500 MG PO TABS
500.0000 mg | ORAL_TABLET | Freq: Two times a day (BID) | ORAL | 0 refills | Status: AC
  Filled 2023-04-21: qty 12, 6d supply, fill #0

## 2023-04-21 MED ORDER — MIDODRINE HCL 10 MG PO TABS
10.0000 mg | ORAL_TABLET | Freq: Three times a day (TID) | ORAL | 0 refills | Status: AC | PRN
  Filled 2023-04-21: qty 40, 14d supply, fill #0

## 2023-04-21 MED ORDER — LEVETIRACETAM 500 MG PO TABS
500.0000 mg | ORAL_TABLET | Freq: Two times a day (BID) | ORAL | 3 refills | Status: DC
  Filled 2023-04-21: qty 60, 30d supply, fill #0

## 2023-04-21 NOTE — Plan of Care (Signed)

## 2023-04-21 NOTE — Discharge Summary (Signed)
[facility] Discharge Summary   Patient: Micheal Donovan EAV:[id number]  PCP: Donita Brooks, MD  Date of admission: 04/17/2023   Date of discharge: 04/21/2023     Discharge Diagnoses:  Principal Problem:   Seizure [facility]) Active Problems:   UTI (urinary tract infection)   Primary non-small cell carcinoma of upper lobe of left lung (HCC)   Type 2 diabetes mellitus without complications (HCC)   Essential hypertension   Chronic diastolic CHF (congestive heart failure) (HCC)   BPH (benign prostatic hyperplasia)   Depression with anxiety   Seizure disorder (HCC)   Admitted From: Home Disposition:  Home with home health services  Recommendations for Outpatient Follow-up:  Follow-up with PCP in 1 week Follow-up with neurology in 1 week Follow up LABS/TEST:     Follow-up Information     Donita Brooks, MD Follow up in 1 week(s).   Specialty: Family Medicine Contact information: [address] [place] [address] [phone]                Diet recommendation: Cardiac and Carb modified diet  Activity: The patient is advised to gradually reintroduce usual activities, as tolerated  Discharge Condition: stable  Code Status: Full code   History of present illness: As per the H and P dictated on admission Hospital Course:  EMEKA Donovan is a 73 y.o. male with medical history significant of non-small cell lung cancer on Keytruda, absence seizure not taking medications currently, hypertension, hyperlipidemia, diabetes mellitus, diastolic CHF, stroke, depression with anxiety, BPH, melanoma, PE not on anticoagulants (finished treatment), who presents with twitching in right leg and arm.   Per her daughter and the wife at bedside, pt started having right leg and arm "twitch" that comes and goes since this morning.  No weakness or tingling in extremities.  No facial droop or slurred speech.  Patient did not have confusion or altered mental status at home, but  became confused after having received 3 doses of total 2.5 mg of Ativan in ED.  No more twitching in ED.  Patient does not have chest pain, cough, SOB.  No nausea, vomiting, diarrhea or abdominal pain.  Not sure if patient has symptoms of UTI.  Of note, patient had urinalysis by his doctor yesterday which was positive for UTI.  UA on 04/16/2023 showed cloudy appearance, 3+ leukocytes, positive nitrite, many bacteria, WBC> 30.  Per his wife, patient has history of absence seizure, used to be on Keppra, but patient himself stopped taking Keppra for more than 3 months.   Data reviewed independently and ED Course: pt was found to have WBC 7.3, CK 72, GFR> 60, temperature normal, blood pressure 134/56, heart rate 71, RR 16, oxygen saturation 98% on room air.  Patient is placed on telemetry bed for observation.   MRI of brain: Severely motion limited study and incomplete study without obvious acute abnormality. Of note, postcontrast imaging was not performed and metastatic disease cannot be excluded.   EKG: I have personally reviewed.  Sinus rhythm, QTc 499, bifascicular block   Assessment and Plan:   # Seizure: Patient symptoms are likely due to seizure.  Patient has history of seizure, but stopped taking Keppra for more than 3 months.  He has UTI which likely lowered threshold of seizure.   MRI of the brain is limited study, but did not show obvious abnormalities. Continue seizure precaution, s/p prn Ativan for seizure S/p Loaded with Keppra 1000 mg by IV and Restart  Keppra 500 mg BID PO vs IV if patient is unable to swallow. Prn Robaxin. Neurology consulted, MRI repeated, negative for any acute findings.  continue Keppra 500 twice daily. No need for EEG.  Patient was cleared by neurology to discharge home and follow-up as an outpatient.   # UTI (urinary tract infection)s/p IV Rocephin UA positive, urine culture growing Pseudomonas 2/17 started cefepime 2 g every 12 hourly, discussed with  pharmacy. Pseudomonas sensitive to ciprofloxacin, so patient was discharged on ciprofloxacin 5 mg p.o. twice daily for 6 days.   # Primary non-small cell carcinoma of upper lobe of left lung -Patient is on Keytruda currently -Follow-up with Dr. Cathie Hoops of oncology   # Type 2 diabetes mellitus without complications: Recent A1c 6.6, well-controlled.  Patient taking NovoLog and as needed Lantus S/p sliding scale insulin and S/p Semglee 5 u at bedtime, DC'd on 2/17 due to hypoglycemia. S/p Hypoglycemia protocol.  Resumed home regimen on discharge.  Patient was advised to monitor CBG, continue diabetic diet.  Follow with PCP.   # Essential hypertension:  Patient's not taking his Diovan currently per his wife.   S/p Replacement with Diovan with irbesartan 75 mg p.o. daily, d/c'd on 2/16  On 2/16 Started midodrine 10 mg p.o. 3 times daily with holding parameters NS bolus 500 mL given. On 2/17 NS 500 mL bolus given. Discontinued valsartan on discharge due to low blood pressure and patient was discharged on midodrine 10 mg p.o. 3 times daily prn if systolic BP less than 110.  Advised to monitor BP at home and follow with PCP to titrate medication accordingly   # Chronic diastolic CHF (congestive heart failure) (HCC): 2D echo on 06/18/2022 showed EF 55-60% with grade 1 diastolic dysfunction.  Patient does not have leg edema or JVD.  No oxygen desaturation.  CHF seem to be compensated.  BNP --> 245 # BPH (benign prostatic hyperplasia): Flomax and Proscar # Depression with anxiety: Continue home medications   Body mass index is 21.58 kg/m.  Nutrition Interventions:  Pressure Injury 02/09/23 Sacrum Medial;Lower Stage 2 -  Partial thickness loss of dermis presenting as a shallow open injury with a red, pink wound bed without slough. pink, open sore (Active)  02/09/23 1400  Location: Sacrum  Location Orientation: Medial;Lower  Staging: Stage 2 -  Partial thickness loss of dermis presenting as a shallow  open injury with a red, pink wound bed without slough.  Wound Description (Comments): pink, open sore  Present on Admission: Yes  Dressing Type Foam - Lift dressing to assess site every shift 04/20/23 2030     - Patient was instructed, not to drive, operate heavy machinery, perform activities at heights, swimming or participation in water activities or provide baby sitting services while on Pain, Sleep and Anxiety Medications; until his outpatient Physician has advised to do so again.  - Also recommended to not to take more than prescribed Pain, Sleep and Anxiety Medications.  Patient was seen by physical therapy, who recommended Home health, which was arranged. On the day of the discharge the patient's vitals were stable, and no other acute medical condition were reported by patient. the patient was felt safe to be discharge at Home with Home health.  Consultants: Neurology Procedures: None  Discharge Exam: General: Appear in no distress, no Rash; Oral Mucosa Clear, moist. Cardiovascular: S1 and S2 Present, no Murmur, Respiratory: normal respiratory effort, Bilateral Air entry present and no Crackles, no wheezes Abdomen: Bowel Sound present, Soft and no tenderness,  no hernia Extremities: no Pedal edema, no calf tenderness Neurology: alert and oriented to time, place, and person affect appropriate.  Filed Weights   04/18/23 0131 04/20/23 0356 04/21/23 0401  Weight: 56.8 kg 61 kg 62.5 kg   Vitals:   04/21/23 0405 04/21/23 0829  BP: (!) 120/56 (!) 102/58  Pulse: 73 67  Resp: 18 12  Temp: 98.4 F (36.9 C) 98.1 F (36.7 C)  SpO2: 99% 98%    DISCHARGE MEDICATION: Allergies as of 04/21/2023       Reactions   Niaspan [niacin]    FLUSHING        Medication List     STOP taking these medications    finasteride 5 MG tablet Commonly known as: PROSCAR   potassium chloride SA 20 MEQ tablet Commonly known as: KLOR-CON M   valsartan 80 MG tablet Commonly known as:  DIOVAN       TAKE these medications    ALPRAZolam 0.5 MG tablet Commonly known as: XANAX Take 1 tablet (0.5 mg total) by mouth at bedtime as needed for anxiety.   B-D UF III MINI PEN NEEDLES 31G X 5 MM Misc Generic drug: Insulin Pen Needle Use to inject insulin daily.   celecoxib 200 MG capsule Commonly known as: CeleBREX Take 1 capsule (200 mg total) by mouth 2 (two) times daily.   ciprofloxacin 500 MG tablet Commonly known as: Cipro Take 1 tablet (500 mg total) by mouth 2 (two) times daily for 6 days.   citalopram 10 MG tablet Commonly known as: CELEXA Take 1 tablet (10 mg total) by mouth daily.   docusate sodium 100 MG capsule Commonly known as: COLACE Take 100 mg by mouth 2 (two) times daily as needed for mild constipation.   fentaNYL 75 MCG/HR Commonly known as: DURAGESIC Place 1 patch onto the skin every 3 (three) days.   gabapentin 300 MG capsule Commonly known as: NEURONTIN Take 2 capsules (600 mg total) by mouth 2 (two) times daily.   Gerhardt's butt cream Crea Apply 14 Applications topically 2 (two) times daily.   insulin glargine 100 UNIT/ML injection Commonly known as: LANTUS Inject 0-30 Units into the skin daily as needed (High Blood Sugar over 150).   levETIRAcetam 500 MG tablet Commonly known as: KEPPRA Take 1 tablet (500 mg total) by mouth 2 (two) times daily.   megestrol 40 MG tablet Commonly known as: MEGACE Take 1 tablet (40 mg total) by mouth daily.   methocarbamol 500 MG tablet Commonly known as: ROBAXIN Take 1 tablet (500 mg total) by mouth every 6 (six) hours as needed for muscle spasms.   midodrine 10 MG tablet Commonly known as: PROAMATINE Take 1 tablet (10 mg total) by mouth 3 (three) times daily as needed (Systolic BP less than 110 mmHg).   NovoLOG FlexPen 100 UNIT/ML FlexPen Generic drug: insulin aspart Inject 0-10 Units into the skin daily as needed for high blood sugar (BG > 200).   omeprazole 20 MG capsule Commonly  known as: PRILOSEC Take 20 mg by mouth daily.   oxyCODONE-acetaminophen 7.5-325 MG tablet Commonly known as: Percocet Take 1 tablet by mouth every 4 (four) hours as needed for severe pain (pain score 7-10). Do not take with cough medication or other pain medication   senna 8.6 MG Tabs tablet Commonly known as: SENOKOT Take 1 tablet (8.6 mg total) by mouth 2 (two) times daily as needed for mild constipation.   tamsulosin 0.4 MG Caps capsule Commonly known as: FLOMAX Take 1  capsule (0.4 mg total) by mouth daily.               Discharge Care Instructions  (From admission, onward)           Start     Ordered   04/21/23 0000  Discharge wound care:       Comments: As above   04/21/23 1413           Allergies  Allergen Reactions   Niaspan [Niacin]     FLUSHING   Discharge Instructions     Ambulatory referral to Neurology   Complete by: As directed    An appointment is requested in approximately: 1 week   Call MD for:  difficulty breathing, headache or visual disturbances   Complete by: As directed    Call MD for:  extreme fatigue   Complete by: As directed    Call MD for:  persistant dizziness or light-headedness   Complete by: As directed    Call MD for:  persistant nausea and vomiting   Complete by: As directed    Call MD for:  severe uncontrolled pain   Complete by: As directed    Call MD for:  temperature >100.4   Complete by: As directed    Diet - low sodium heart healthy   Complete by: As directed    Discharge instructions   Complete by: As directed    Follow-up with PCP in 1 week Follow-up with neurology in 1 week   Discharge wound care:   Complete by: As directed    As above   Increase activity slowly   Complete by: As directed        The results of significant diagnostics from this hospitalization (including imaging, microbiology, ancillary and laboratory) are listed below for reference.    Significant Diagnostic Studies: MR BRAIN W WO  CONTRAST Result Date: 04/18/2023 CLINICAL DATA:  Seizure, new-onset, no history of trauma repeat MRI brain wwo, 1st was nondiagnostic 2/2 movement EXAM: MRI HEAD WITHOUT AND WITH CONTRAST TECHNIQUE: Multiplanar, multiecho pulse sequences of the brain and surrounding structures were obtained without and with intravenous contrast. CONTRAST:  5mL GADAVIST GADOBUTROL 1 MMOL/ML IV SOLN COMPARISON:  MRI February 14, 25. 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 No evidence of acute infarct, acute hemorrhage, mass lesion, midline shift or hydrocephalus. No pathologic enhancement. No hydrocephalus. Vascular: Abnormal left intradural vertebral artery flow void, compatible with stenosis seen on prior CTA. Major arterial flow voids are maintained at the skull base. Skull and upper cervical spine: Normal marrow signal. Sinuses/Orbits: Mostly clear sinuses.  No acute orbital findings. Other: No mastoid effusions. IMPRESSION: 1. No evidence of acute intracranial abnormality or metastatic disease. 2. 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 Electronically Signed   By: Feliberto Harts M.D.   On: 04/18/2023 21:53   MR Brain W and Wo Contrast Result Date: 04/17/2023 CLINICAL DATA:  Seizure disorder, clinical change New right sided invol jerking, hx of non-small cell lung ca EXAM: MRI HEAD WITHOUT AND WITH CONTRAST TECHNIQUE: Multiplanar, multiecho pulse sequences of the brain and surrounding structures were obtained without and with intravenous contrast. CONTRAST:  1mL GADAVIST GADOBUTROL 1 MMOL/ML IV SOLN COMPARISON:  MRI 02/24/2023. FINDINGS: Severely motion limited and incomplete study study. Many sequences were not performed, including post-contrast imaging. The following sequences were performed: DWI/ADC and motion limited sagittal T1 and axial T2/FLAIR. Within this limitation: Brain: No acute infarction, acute hemorrhage, hydrocephalus, extra-axial collection or mass lesion.  Remote left  cerebellar hemorrhagic infarct. Cerebral atrophy. Vascular: Not well assessed due to motion. Skull and upper cervical spine: Normal marrow signal. Sinuses/Orbits: Mostly clear sinuses. Other: No mastoid effusions. IMPRESSION: Severely motion limited study and incomplete study without obvious acute abnormality. Of note, postcontrast imaging was not performed and metastatic disease cannot be excluded. Electronically Signed   By: Feliberto Harts M.D.   On: 04/17/2023 20:38   DG Cervical Spine 2 or 3 views Result Date: 03/27/2023 CLINICAL DATA:  Status post cervical fusion.  Cervical myelopathy. EXAM: CERVICAL SPINE - 2-3 VIEW COMPARISON:  Operative imaging, 02/09/2023. MR cervical spine, 01/09/2023. FINDINGS: Alvino Chapel fracture or bone lesion.  No spondylolisthesis. Anterior cervical spine fusion, C3 through C5. Well-positioned anterior fusion plate with well-seated fixation screws. Intervertebral disc cages are well centered at C3-C4 and C4-C5. No evidence of hardware loosening. Mature bony fusion at C5-C6. Mild to moderate loss of disc height with endplate spurring at C6-C7. Soft tissues are unremarkable. IMPRESSION: 1. Anterior cervical disc fusion C3 through C5. No evidence of a hardware complication. 2. No fracture or acute finding. Electronically Signed   By: Amie Portland M.D.   On: 03/27/2023 10:09    Microbiology: Recent Results (from the past 240 hours)  Microscopic Examination     Status: Abnormal   Collection Time: 04/16/23  2:59 PM   Urine  Result Value Ref Range Status   WBC, UA >30 (A) 0 - 5 /hpf Final   RBC, Urine 3-10 (A) 0 - 2 /hpf Final   Epithelial Cells (non renal) >10 (A) 0 - 10 /hpf Final   Casts Present (A) None seen /lpf Final   Cast Type Hyaline casts N/A Final   Crystals Present (A) N/A Final   Crystal Type Amorphous Sediment N/A Final   Mucus, UA Present (A) Not Estab. Final   Bacteria, UA Many (A) None seen/Few Final  Urine Culture (for pregnant, neutropenic or urologic  patients or patients with an indwelling urinary catheter)     Status: Abnormal   Collection Time: 04/17/23  9:54 PM   Specimen: Urine, Clean Catch  Result Value Ref Range Status   Specimen Description   Final    URINE, CLEAN CATCH Performed at [facility], [address], [place], [place] [address]    Special Requests   Final    NONE Performed at [facility], [address] [place], [place] [address]    Culture (A)  Final    >=100,000 COLONIES/mL PSEUDOMONAS AERUGINOSA >=100,000 COLONIES/mL CITROBACTER BRAAKII    Report Status 04/21/2023 FINAL  Final   Organism ID, Bacteria PSEUDOMONAS AERUGINOSA (A)  Final   Organism ID, Bacteria CITROBACTER BRAAKII (A)  Final      Susceptibility   Citrobacter braakii - MIC*    CEFEPIME <=0.12 SENSITIVE Sensitive     CEFTRIAXONE <=0.25 SENSITIVE Sensitive     CIPROFLOXACIN <=0.25 SENSITIVE Sensitive     GENTAMICIN <=1 SENSITIVE Sensitive     IMIPENEM <=0.25 SENSITIVE Sensitive     NITROFURANTOIN <=16 SENSITIVE Sensitive     TRIMETH/SULFA <=20 SENSITIVE Sensitive     PIP/TAZO <=4 SENSITIVE Sensitive ug/mL    * >=100,000 COLONIES/mL CITROBACTER BRAAKII   Pseudomonas aeruginosa - MIC*    CEFTAZIDIME 4 SENSITIVE Sensitive     CIPROFLOXACIN <=0.25 SENSITIVE Sensitive     GENTAMICIN <=1 SENSITIVE Sensitive     IMIPENEM 2 SENSITIVE Sensitive     PIP/TAZO 8 SENSITIVE Sensitive ug/mL    CEFEPIME 2 SENSITIVE Sensitive     * >=  100,000 COLONIES/mL PSEUDOMONAS AERUGINOSA     Labs: CBC: Recent Labs  Lab 04/17/23 1352 04/18/23 0526 04/19/23 0648 04/20/23 1017 04/21/23 0557  WBC 7.3 7.3 5.1 5.4 4.7  NEUTROABS 5.8  --   --   --   --   HGB 12.2* 13.2 11.4* 12.0* 11.0*  HCT 37.6* 39.8 33.9* 36.2* 33.2*  MCV 91.9 90.2 89.2 90.3 90.2  PLT 242 207 229 251 227   Basic Metabolic Panel: Recent Labs  Lab 04/17/23 1352 04/18/23 0848 04/19/23 0648 04/20/23 1017 04/21/23 0557  NA 137 138 136 133* 135  K 3.8 3.5 3.3* 3.8 3.7   CL 103 108 106 102 105  CO2 22 21* 23 23 22   GLUCOSE 193* 133* 98 121* 131*  BUN 16 13 15 19 19   CREATININE 0.95 0.90 0.92 1.24 1.04  CALCIUM 9.3 9.1 9.0 8.5* 8.7*  MG 2.3 2.3 2.2 2.0 2.1  PHOS 2.7 2.7 3.3 3.8 3.4   Liver Function Tests: Recent Labs  Lab 04/17/23 1352  AST 17  ALT 9  ALKPHOS 64  BILITOT 0.7  PROT 6.9  ALBUMIN 3.9   No results for input(s): "LIPASE", "AMYLASE" in the last 168 hours. No results for input(s): "AMMONIA" in the last 168 hours. Cardiac Enzymes: Recent Labs  Lab 04/17/23 1352  CKTOTAL 72   BNP (last 3 results) Recent Labs    04/17/23 1352  BNP 245.2*   CBG: Recent Labs  Lab 04/20/23 1144 04/20/23 1623 04/20/23 2112 04/21/23 0825 04/21/23 1121  GLUCAP 101* 127* 192* 124* 141*    Time spent: 35 minutes  Signed:  Gillis Santa  [facility] 04/21/2023 2:13 PM

## 2023-04-21 NOTE — TOC Transition Note (Signed)
Transition of Care [facility]) - Discharge Note   Patient Details  Name: Micheal Donovan MRN: [id number] Date of Birth: [birth year]  Transition of Care [facility]) CM/SW Contact:  Margarito Liner, LCSW Phone Number: 04/21/2023, 2:48 PM   Clinical Narrative: Patient has orders to discharge home today. [facility] accepted referral for home health PT and nursing. No further concerns. CSW signing off.    Final next level of care: Home w Home Health Services Barriers to Discharge: Barriers Resolved   Patient Goals and CMS Choice Patient states their goals for this hospitalization and ongoing recovery are:: home with [facility] CMS Medicare.gov Compare Post Acute Care list provided to:: Patient Represenative (must comment) Choice offered to / list presented to : Spouse      Discharge Placement                Patient to be transferred to facility by: Daughter Name of family member notified: Cephus Slater Patient and family notified of of transfer: 04/21/23  Discharge Plan and Services Additional resources added to the After Visit Summary for                            [facility] Arranged: RN, PT [facility] Agency: [facility] Date [facility] Agency Contacted: 04/21/23   Representative spoke with at [facility] Agency: Shanda Bumps  Social Drivers of Health (SDOH) Interventions SDOH Screenings   Food Insecurity: No Food Insecurity (04/19/2023)  Housing: Low Risk  (04/19/2023)  Transportation Needs: No Transportation Needs (04/19/2023)  Utilities: Patient Unable To Answer (04/19/2023)  Alcohol Screen: Low Risk  (03/27/2022)  Depression (PHQ2-9): High Risk (04/21/2022)  Financial Resource Strain: Low Risk  (04/17/2022)  Physical Activity: Inactive (03/27/2022)  Social Connections: Patient Unable To Answer (04/19/2023)  Stress: Stress Concern Present (03/27/2022)  Tobacco Use: Medium Risk (04/17/2023)     Readmission Risk Interventions    11/20/2021   10:17 AM  Readmission Risk Prevention Plan  Transportation Screening  Complete  PCP or Specialist Appt within 3-5 Days Complete  HRI or Home Care Consult --  Social Work Consult for Recovery Care Planning/Counseling --  Palliative Care Screening Not Applicable  Medication Review [employer]) Complete

## 2023-04-21 NOTE — Consult Note (Signed)
[facility] Liaison Consult Note   04/21/2023  Micheal Donovan 11/15/1950 [id number]  Insurance: [employer]  Primary Care Provider: Donita Brooks, MD, with [facility], this provider is listed for the transition of care follow up appointments  and [facility]BCI [facility] calls  [facility] Liaison remote review coverage for patient admitted to [facility]. Coverage for [name], RN, VBCI [facility]   The patient was screened for hospitalization with noted high risk score for unplanned readmission risk 2 ED and 2 hospital admissions in 6 months.  The patient was assessed for potential Community Care Coordination service needs for post hospital transition for care coordination. Review of patient's electronic medical record reveals patient is for home with [facility].  Reviewed that wife has flu and daughter is contact.  Called placed to [facility], on DPR, and provided HIPAA. Explained reason for call and offered post hospital support.  Explained PCP has VBCI TOC support for post hospital follow up calls.  Phone listed is patient's cell, his wife has the flu, daughter can be contacted.  Plan: [facility] Liaison will continue to follow progress and disposition to asess for post hospital community care coordination/management needs.  Referral request for community care coordination: Anticipate follow up from [facility]BCI [facility] team.   [facility], [facility] does not replace or interfere with any arrangements made by the Inpatient Transition of Care team.   For questions contact:   Charlesetta Shanks, RN, BSN, CCM [place]  [facility], [facility] Liaison Direct Dial: [phone] or secure chat Email: [name]

## 2023-04-21 NOTE — TOC Progression Note (Addendum)
Transition of Care [facility]) - Progression Note    Patient Details  Name: Micheal Donovan MRN: [id number] Date of Birth: [birth year]  Transition [facility]) CM/SW Contact  Margarito Liner, LCSW Phone Number: 04/21/2023, 9:08 AM  Clinical Narrative: [facility] is checking to see if they can accept patient's home health referral.    12:27 pm: [facility] can accept for PT only if wife agrees to do dressing changes. CSW met with patient and daughter. Wife is currently at home with the flu so daughter requested to have a HHRN. [facility] is reviewing referral.  Expected Discharge Plan: Home w Home Health Services Barriers to Discharge: Continued Medical Work up  Expected Discharge Plan and Services       Living arrangements for the past 2 months: Single Family Home                           HH Arranged: PT [facility] Agency: [facility] Date [facility] Agency Contacted: 04/19/23   Representative spoke with at [facility] Agency: Silvio Pate   Social Determinants of Health (SDOH) Interventions SDOH Screenings   Food Insecurity: No Food Insecurity (04/19/2023)  Housing: Low Risk  (04/19/2023)  Transportation Needs: No Transportation Needs (04/19/2023)  Utilities: Patient Unable To Answer (04/19/2023)  Alcohol Screen: Low Risk  (03/27/2022)  Depression (PHQ2-9): High Risk (04/21/2022)  Financial Resource Strain: Low Risk  (04/17/2022)  Physical Activity: Inactive (03/27/2022)  Social Connections: Patient Unable To Answer (04/19/2023)  Stress: Stress Concern Present (03/27/2022)  Tobacco Use: Medium Risk (04/17/2023)    Readmission Risk Interventions    11/20/2021   10:17 AM  Readmission Risk Prevention Plan  Transportation Screening Complete  PCP or Specialist Appt within 3-5 Days Complete  HRI or Home Care Consult --  Social Work Consult for Recovery Care Planning/Counseling --  Palliative Care Screening Not Applicable  Medication Review Ocean[employer]) Complete

## 2023-04-22 ENCOUNTER — Other Ambulatory Visit: Payer: PPO

## 2023-04-22 ENCOUNTER — Telehealth: Payer: Self-pay | Admitting: *Deleted

## 2023-04-22 NOTE — Transitions of Care (Post Inpatient/ED Visit) (Signed)
04/22/2023  Name: Micheal Donovan MRN: [id number] DOB: 11-08-1950  Today's TOC FU Call Status: Today's TOC FU Call Status:: Successful TOC FU Call Completed TOC FU Call Complete Date: 04/22/23 Patient's Name and Date of Birth confirmed.  Transition Care Management Follow-up Telephone Call Date of Discharge: 04/21/23 Discharge Facility: [facility]) Type of Discharge: Inpatient Admission Primary Inpatient Discharge Diagnosis:: Seizure How have you been since you were released from the hospital?: Same (pt states he is eating and drinking well "I just have weakness, difficult for me to stand up and walk, using my walker", no issues with bowel/ bladder) Any questions or concerns?: No  Items Reviewed: Did you receive and understand the discharge instructions provided?: Yes Medications obtained,verified, and reconciled?: Yes (Medications Reviewed) Any new allergies since your discharge?: No Dietary orders reviewed?: Yes Do you have support at home?: Yes People in Home: spouse Name of Support/Comfort Primary Source: Jordan Pardini Patient agreeable to participation in [facility] 30 day program  Medications Reviewed Today: Medications Reviewed Today     Reviewed by Audrie Gallus, RN (Registered Nurse) on 04/22/23 at 1254  Med List Status: <None>   Medication Order Taking? Sig Documenting Provider Last Dose Status Informant  ALPRAZolam (XANAX) 0.5 MG tablet [id number] Yes Take 1 tablet (0.5 mg total) by mouth at bedtime as needed for anxiety. Borders, Daryl Eastern, NP Taking Active Spouse/Significant Other, Pharmacy Records           Med Note Freda Munro Apr 20, 2023  2:38 PM) prn  celecoxib (CELEBREX) 200 MG capsule [id number] Yes Take 1 capsule (200 mg total) by mouth 2 (two) times daily. Susanne Borders, PA Taking Active Spouse/Significant Other, Pharmacy Records           Med Note Freda Munro Apr 20, 2023  2:38 PM) prn  ciprofloxacin  (CIPRO) 500 MG tablet [id number] Yes Take 1 tablet (500 mg total) by mouth 2 (two) times daily for 6 days. Gillis Santa, MD Taking Active   citalopram (CELEXA) 10 MG tablet [id number] Yes Take 1 tablet (10 mg total) by mouth daily. Borders, Daryl Eastern, NP Taking Active Spouse/Significant Other, Pharmacy Records  docusate sodium (COLACE) 100 MG capsule [id number] Yes Take 100 mg by mouth 2 (two) times daily as needed for mild constipation. [provider] Taking Active Spouse/Significant Other, Pharmacy Records           Med Note Freda Munro Apr 20, 2023  3:10 PM) prn  fentaNYL (DURAGESIC) 75 MCG/HR [id number] Yes Place 1 patch onto the skin every 3 (three) days. Borders, Daryl Eastern, NP Taking Active Spouse/Significant Other, Pharmacy Records  gabapentin (NEURONTIN) 300 MG capsule [id number] Yes Take 2 capsules (600 mg total) by mouth 2 (two) times daily. Henreitta Leber, MD Taking Active Spouse/Significant Other, Pharmacy Records  insulin glargine (LANTUS) 100 UNIT/ML injection [id number] Yes Inject 0-30 Units into the skin daily as needed (High Blood Sugar over 150). [provider] Taking Active Spouse/Significant Other, Pharmacy Records           Med Note Freda Munro Apr 20, 2023  3:10 PM) prn  Insulin Pen Needle (B-D UF III MINI PEN NEEDLES) 31G X 5 MM MISC [id number] Yes Use to inject insulin daily. Donita Brooks, MD Taking Active Spouse/Significant Other, Pharmacy Records  levETIRAcetam (KEPPRA) 500 MG tablet [id number] Yes Take 1 tablet (500 mg total) by mouth 2 (  two) times daily. Gillis Santa, MD Taking Active   megestrol (MEGACE) 40 MG tablet [id number] Yes Take 1 tablet (40 mg total) by mouth daily. Donita Brooks, MD Taking Active Spouse/Significant Other, Pharmacy Records  methocarbamol (ROBAXIN) 500 MG tablet [id number] No Take 1 tablet (500 mg total) by mouth every 6 (six) hours as needed for muscle spasms.  Patient not taking: Reported on  04/22/2023   Susanne Borders, PA Not Taking Active Spouse/Significant Other, Pharmacy Records           Med Note Freda Munro Apr 20, 2023  3:01 PM) prn  midodrine (PROAMATINE) 10 MG tablet [id number] Yes Take 1 tablet (10 mg total) by mouth 3 (three) times daily as needed (Systolic BP less than 110 mmHg). Gillis Santa, MD Taking Active   NOVOLOG FLEXPEN 100 UNIT/ML FlexPen [id number] Yes Inject 0-10 Units into the skin daily as needed for high blood sugar (BG > 200). [provider] Taking Active Spouse/Significant Other, Pharmacy Records           Med Note Freda Munro Apr 20, 2023  3:10 PM) prn  Nystatin (GERHARDT'S BUTT CREAM) CREA [id number] Yes Apply 14 Applications topically 2 (two) times daily. Susanne Borders, PA Taking Active Spouse/Significant Other, Pharmacy Records  omeprazole (PRILOSEC) 20 MG capsule [id number] Yes Take 20 mg by mouth daily. [provider] Taking Active Spouse/Significant Other, Pharmacy Records           Med Note Gunnar Fusi, MELISSA R   Wed Jan 21, 2023  2:25 PM) OTC   oxyCODONE-acetaminophen (PERCOCET) 7.5-325 MG tablet [id number] Yes Take 1 tablet by mouth every 4 (four) hours as needed for severe pain (pain score 7-10). Do not take with cough medication or other pain medication Borders, Daryl Eastern, NP Taking Active Spouse/Significant Other, Pharmacy Records           Med Note Freda Munro Apr 20, 2023  3:02 PM) prn    Discontinued 08/06/21 1639   senna (SENOKOT) 8.6 MG TABS tablet [id number] Yes Take 1 tablet (8.6 mg total) by mouth 2 (two) times daily as needed for mild constipation. Susanne Borders, PA Taking Active Spouse/Significant Other, Pharmacy Records           Med Note Freda Munro Apr 20, 2023  3:09 PM) prn  tamsulosin [facility]) 0.4 MG CAPS capsule [id number] Yes Take 1 capsule (0.4 mg total) by mouth daily. Riki Altes, MD Taking Active Spouse/Significant Other, Pharmacy  Records            Home Care and Equipment/Supplies: Were Home Health Services Ordered?: Yes Name of Home Health Agency:: [facility] Has Agency set up a time to come to your home?: No EMR reviewed for Home Health Orders: Orders present/patient has not received call (refer to CM for follow-up) Any new equipment or medical supplies ordered?: No  Functional Questionnaire: Do you need assistance with bathing/showering or dressing?: Yes (spouse assists) Do you need assistance with meal preparation?: Yes (spouse assists) Do you need assistance with eating?: No Do you have difficulty maintaining continence: No Do you need assistance with getting out of bed/getting out of a chair/moving?: Yes (uses walker, not walking much) Do you have difficulty managing or taking your medications?: No  Follow up appointments reviewed: PCP Follow-up appointment confirmed?: Yes Date of PCP follow-up appointment?: 04/28/23 Follow-up Provider: [facility] Follow-up appointment confirmed?: Yes Date  of Specialist follow-up appointment?: 04/30/23 Follow-Up Specialty Provider:: [facility]  @ 230 pm Do you need transportation to your follow-up appointment?: No Do you understand care options if your condition(s) worsen?: Yes-patient verbalized understanding  SDOH Interventions Today    Flowsheet Row Most Recent Value  SDOH Interventions   Food Insecurity Interventions Intervention Not Indicated  Housing Interventions Intervention Not Indicated  Transportation Interventions Intervention Not Indicated  Utilities Interventions Intervention Not Indicated       [name] Shows [facility], BSN RN Care Manager/ Transition of Care [place]/ [facility] Population Health [phone]

## 2023-04-23 ENCOUNTER — Ambulatory Visit: Payer: PPO | Admitting: Neurosurgery

## 2023-04-23 ENCOUNTER — Other Ambulatory Visit: Payer: Self-pay

## 2023-04-26 DIAGNOSIS — N390 Urinary tract infection, site not specified: Secondary | ICD-10-CM | POA: Diagnosis not present

## 2023-04-26 DIAGNOSIS — L89150 Pressure ulcer of sacral region, unstageable: Secondary | ICD-10-CM | POA: Diagnosis not present

## 2023-04-26 DIAGNOSIS — Z9181 History of falling: Secondary | ICD-10-CM | POA: Diagnosis not present

## 2023-04-26 DIAGNOSIS — M6389 Disorders of muscle in diseases classified elsewhere, multiple sites: Secondary | ICD-10-CM | POA: Diagnosis not present

## 2023-04-26 DIAGNOSIS — Z993 Dependence on wheelchair: Secondary | ICD-10-CM | POA: Diagnosis not present

## 2023-04-26 DIAGNOSIS — Z791 Long term (current) use of non-steroidal anti-inflammatories (NSAID): Secondary | ICD-10-CM | POA: Diagnosis not present

## 2023-04-26 DIAGNOSIS — I110 Hypertensive heart disease with heart failure: Secondary | ICD-10-CM | POA: Diagnosis not present

## 2023-04-26 DIAGNOSIS — Z87440 Personal history of urinary (tract) infections: Secondary | ICD-10-CM | POA: Diagnosis not present

## 2023-04-26 DIAGNOSIS — Z85820 Personal history of malignant melanoma of skin: Secondary | ICD-10-CM | POA: Diagnosis not present

## 2023-04-26 DIAGNOSIS — F418 Other specified anxiety disorders: Secondary | ICD-10-CM | POA: Diagnosis not present

## 2023-04-26 DIAGNOSIS — L89152 Pressure ulcer of sacral region, stage 2: Secondary | ICD-10-CM | POA: Diagnosis not present

## 2023-04-26 DIAGNOSIS — Z87891 Personal history of nicotine dependence: Secondary | ICD-10-CM | POA: Diagnosis not present

## 2023-04-26 DIAGNOSIS — H269 Unspecified cataract: Secondary | ICD-10-CM | POA: Diagnosis not present

## 2023-04-26 DIAGNOSIS — E1136 Type 2 diabetes mellitus with diabetic cataract: Secondary | ICD-10-CM | POA: Diagnosis not present

## 2023-04-26 DIAGNOSIS — Z556 Problems related to health literacy: Secondary | ICD-10-CM | POA: Diagnosis not present

## 2023-04-26 DIAGNOSIS — N400 Enlarged prostate without lower urinary tract symptoms: Secondary | ICD-10-CM | POA: Diagnosis not present

## 2023-04-26 DIAGNOSIS — Z86711 Personal history of pulmonary embolism: Secondary | ICD-10-CM | POA: Diagnosis not present

## 2023-04-26 DIAGNOSIS — M1990 Unspecified osteoarthritis, unspecified site: Secondary | ICD-10-CM | POA: Diagnosis not present

## 2023-04-26 DIAGNOSIS — Z8546 Personal history of malignant neoplasm of prostate: Secondary | ICD-10-CM | POA: Diagnosis not present

## 2023-04-26 DIAGNOSIS — I5032 Chronic diastolic (congestive) heart failure: Secondary | ICD-10-CM | POA: Diagnosis not present

## 2023-04-26 DIAGNOSIS — G40909 Epilepsy, unspecified, not intractable, without status epilepticus: Secondary | ICD-10-CM | POA: Diagnosis not present

## 2023-04-26 DIAGNOSIS — C3412 Malignant neoplasm of upper lobe, left bronchus or lung: Secondary | ICD-10-CM | POA: Diagnosis not present

## 2023-04-26 DIAGNOSIS — K219 Gastro-esophageal reflux disease without esophagitis: Secondary | ICD-10-CM | POA: Diagnosis not present

## 2023-04-26 DIAGNOSIS — Z466 Encounter for fitting and adjustment of urinary device: Secondary | ICD-10-CM | POA: Diagnosis not present

## 2023-04-26 DIAGNOSIS — E785 Hyperlipidemia, unspecified: Secondary | ICD-10-CM | POA: Diagnosis not present

## 2023-04-26 DIAGNOSIS — E11319 Type 2 diabetes mellitus with unspecified diabetic retinopathy without macular edema: Secondary | ICD-10-CM | POA: Diagnosis not present

## 2023-04-26 DIAGNOSIS — Z794 Long term (current) use of insulin: Secondary | ICD-10-CM | POA: Diagnosis not present

## 2023-04-26 DIAGNOSIS — Z8673 Personal history of transient ischemic attack (TIA), and cerebral infarction without residual deficits: Secondary | ICD-10-CM | POA: Diagnosis not present

## 2023-04-28 ENCOUNTER — Ambulatory Visit (INDEPENDENT_AMBULATORY_CARE_PROVIDER_SITE_OTHER): Payer: PPO | Admitting: Family Medicine

## 2023-04-28 ENCOUNTER — Encounter: Payer: Self-pay | Admitting: Family Medicine

## 2023-04-28 VITALS — BP 122/68 | HR 74 | Temp 98.0°F | Ht 67.0 in | Wt 129.4 lb

## 2023-04-28 DIAGNOSIS — R0989 Other specified symptoms and signs involving the circulatory and respiratory systems: Secondary | ICD-10-CM

## 2023-04-28 DIAGNOSIS — E1165 Type 2 diabetes mellitus with hyperglycemia: Secondary | ICD-10-CM | POA: Diagnosis not present

## 2023-04-28 DIAGNOSIS — G629 Polyneuropathy, unspecified: Secondary | ICD-10-CM

## 2023-04-28 DIAGNOSIS — G40909 Epilepsy, unspecified, not intractable, without status epilepticus: Secondary | ICD-10-CM

## 2023-04-28 MED ORDER — LEVETIRACETAM 500 MG PO TABS: 500.0000 mg | ORAL_TABLET | Freq: Two times a day (BID) | ORAL | 3 refills | Status: DC

## 2023-04-28 NOTE — Progress Notes (Signed)
 Wt Readings from Last 3 Encounters:  04/28/23 129 lb 6.4 oz (58.7 kg)  04/21/23 137 lb 12.6 oz (62.5 kg)  04/16/23 126 lb (57.2 kg)     Subjective:   Patient was recently admitted for seizures.  Patient had uncontrolled twitching in his right arm and right leg.  In the hospital he was started on Keppra 500 mg twice daily.  Since discharge from the hospital he has stopped the medication again.  He denies any breakthrough seizure activity.  I reviewed the hospital discharge summary.  MRI was negative for any acute lesions.  Keppra level was subtherapeutic consistent with his noncompliance.  Patient has been taking random amounts of insulin.  His hemoglobin A1c was actually adequately controlled.  He has been taking Lantus once or twice every week depending on what his sugar is.  He is also taking random amounts of NovoLog.  Therefore we had a long discussion today about proper use of a sliding scale.  Patient reports coldness in his feet.  He states that his feet are constantly freezing.  He also has some numbness.  Gabapentin 600 mg twice daily is not helping.  On foot exam today, I am unable to appreciate a dorsalis pedis or posterior tibialis pulse on either side.  It is faintly present in his left foot.  Capillary refill is greater than 7 seconds in all of his toes.  He does have persistent numbness. Past Medical History:  Diagnosis Date   Allergy    Anxiety    BPH (benign prostatic hypertrophy)    Cancer (HCC)    Melanoma on Neck    2008   Carotid artery occlusion    CHF (congestive heart failure) (HCC)    Diabetes mellitus    type 2   ED (erectile dysfunction)    GERD (gastroesophageal reflux disease)    Hemorrhagic stroke (HCC) 06/2021   Hyperlipidemia    Hypertension    Lung cancer (HCC)    Neoplasm related pain    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)    Stroke [facility])    Past Surgical History:  Procedure Laterality Date   ANTERIOR CERVICAL DECOMP/DISCECTOMY FUSION N/A 02/09/2023    Procedure: C3-5 ANTERIOR CERVICAL DISCECTOMY AND FUSION;  Surgeon: Venetia Night, MD;  Location: [facility] ORS;  Service: Neurosurgery;  Laterality: N/A;   BRONCHIAL NEEDLE ASPIRATION BIOPSY  06/17/2021   Procedure: BRONCHIAL NEEDLE ASPIRATION BIOPSIES;  Surgeon: Lorin Glass, MD;  Location: [facility] ENDOSCOPY;  Service: Pulmonary;;   IR IMAGING GUIDED PORT INSERTION  08/05/2021   MELANOMA EXCISION  2008   Left side of neck   RADIOLOGY WITH ANESTHESIA N/A 04/05/2020   Procedure: MRI SPINE WITOUT CONTRAST;  Surgeon: Radiologist, Medication, MD;  Location: [facility] OR;  Service: Radiology;  Laterality: N/A;   RADIOLOGY WITH ANESTHESIA N/A 08/22/2021   Procedure: MRI BRAIN WITH AND WITHOUT CONTRAST  WITH ANESTHESIA;  Surgeon: Radiologist, Medication, MD;  Location: [facility] OR;  Service: Radiology;  Laterality: N/A;   RADIOLOGY WITH ANESTHESIA N/A 12/17/2021   Procedure: MRI BRAIN WITH AND WITHOUT CONTRAST WITH ANESTHESIA; MRI LUMBER WITH AND WITHOUT CONTRAST;  Surgeon: Radiologist, Medication, MD;  Location: [facility] OR;  Service: Radiology;  Laterality: N/A;   RADIOLOGY WITH ANESTHESIA N/A 04/08/2022   Procedure: MRI LUMBER SPINE WITH AND WITHOUT CONTRAST;  Surgeon: Radiologist, Medication, MD;  Location: [facility] OR;  Service: Radiology;  Laterality: N/A;   TONSILLECTOMY     VIDEO BRONCHOSCOPY WITH ENDOBRONCHIAL ULTRASOUND N/A 06/17/2021   Procedure: VIDEO  BRONCHOSCOPY WITH ENDOBRONCHIAL ULTRASOUND;  Surgeon: Lorin Glass, MD;  Location: [facility] ENDOSCOPY;  Service: Pulmonary;  Laterality: N/A;   Current Outpatient Medications on File Prior to Visit  Medication Sig Dispense Refill   ALPRAZolam (XANAX) 0.5 MG tablet Take 1 tablet (0.5 mg total) by mouth at bedtime as needed for anxiety. 15 tablet 0   celecoxib (CELEBREX) 200 MG capsule Take 1 capsule (200 mg total) by mouth 2 (two) times daily. 60 capsule 0   citalopram (CELEXA) 10 MG tablet Take 1 tablet (10 mg total) by mouth daily. 30 tablet 3   docusate sodium (COLACE) 100  MG capsule Take 100 mg by mouth 2 (two) times daily as needed for mild constipation.     fentaNYL (DURAGESIC) 75 MCG/HR Place 1 patch onto the skin every 3 (three) days. 10 patch 0   gabapentin (NEURONTIN) 300 MG capsule Take 2 capsules (600 mg total) by mouth 2 (two) times daily. 120 capsule 2   insulin glargine (LANTUS) 100 UNIT/ML injection Inject 0-30 Units into the skin daily as needed (High Blood Sugar over 150).     Insulin Pen Needle (B-D UF III MINI PEN NEEDLES) 31G X 5 MM MISC Use to inject insulin daily. 100 each 3   megestrol (MEGACE) 40 MG tablet Take 1 tablet (40 mg total) by mouth daily. 30 tablet 3   methocarbamol (ROBAXIN) 500 MG tablet Take 1 tablet (500 mg total) by mouth every 6 (six) hours as needed for muscle spasms. 120 tablet 0   midodrine (PROAMATINE) 10 MG tablet Take 1 tablet (10 mg total) by mouth 3 (three) times daily as needed (Systolic BP less than 110 mmHg). 90 tablet 0   NOVOLOG FLEXPEN 100 UNIT/ML FlexPen Inject 0-10 Units into the skin daily as needed for high blood sugar (BG > 200).     Nystatin (GERHARDT'S BUTT CREAM) CREA Apply 14 Applications topically 2 (two) times daily. 1 each 0   omeprazole (PRILOSEC) 20 MG capsule Take 20 mg by mouth daily.     oxyCODONE-acetaminophen (PERCOCET) 7.5-325 MG tablet Take 1 tablet by mouth every 4 (four) hours as needed for severe pain (pain score 7-10). Do not take with cough medication or other pain medication 60 tablet 0   senna (SENOKOT) 8.6 MG TABS tablet Take 1 tablet (8.6 mg total) by mouth 2 (two) times daily as needed for mild constipation. 30 tablet 0   tamsulosin (FLOMAX) 0.4 MG CAPS capsule Take 1 capsule (0.4 mg total) by mouth daily. 30 capsule 2   [DISCONTINUED] prochlorperazine (COMPAZINE) 10 MG tablet Take 1 tablet (10 mg total) by mouth every 6 (six) hours as needed (Nausea or vomiting). 30 tablet 1   Current Facility-Administered Medications on File Prior to Visit  Medication Dose Route Frequency Provider  Last Rate Last Admin   heparin lock flush 100 UNIT/ML injection              Allergies  Allergen Reactions   Niaspan [Niacin]     FLUSHING   Social History   Socioeconomic History   Marital status: Married    Spouse name: Not on file   Number of children: Not on file   Years of education: Not on file   Highest education level: Not on file  Occupational History   Not on file  Tobacco Use   Smoking status: Former    Current packs/day: 0.00    Types: Cigarettes    Quit date: 07/21/1990    Years since quitting:  32.7   Smokeless tobacco: Never  Vaping Use   Vaping status: Never Used  Substance and Sexual Activity   Alcohol use: No   Drug use: No   Sexual activity: Not Currently  Other Topics Concern   Not on file  Social History Narrative   Not on file   Social Drivers of Health   Financial Resource Strain: Low Risk  (04/17/2022)   Overall Financial Resource Strain (CARDIA)    Difficulty of Paying Living Expenses: Not hard at all  Food Insecurity: No Food Insecurity (04/22/2023)   Hunger Vital Sign    Worried About Running Out of Food in the Last Year: Never true    Ran Out of Food in the Last Year: Never true  Transportation Needs: No Transportation Needs (04/22/2023)   PRAPARE - Administrator, Civil Service (Medical): No    Lack of Transportation (Non-Medical): No  Physical Activity: Inactive (03/27/2022)   Exercise Vital Sign    Days of Exercise per Week: 0 days    Minutes of Exercise per Session: 0 min  Stress: Stress Concern Present (03/27/2022)   [employer] of Occupational Health - Occupational Stress Questionnaire    Feeling of Stress : To some extent  Social Connections: Patient Unable To Answer (04/19/2023)   Social Connection and Isolation Panel [NHANES]    Frequency of Communication with Friends and Family: Patient unable to answer    Frequency of Social Gatherings with Friends and Family: Patient unable to answer    Attends Religious  Services: Patient unable to answer    Active Member of Clubs or Organizations: Patient unable to answer    Attends Banker Meetings: Patient unable to answer    Marital Status: Patient unable to answer  Intimate Partner Violence: Not At Risk (04/22/2023)   Humiliation, Afraid, Rape, and Kick questionnaire    Fear of Current or Ex-Partner: No    Emotionally Abused: No    Physically Abused: No    Sexually Abused: No    Past Medical History:  Diagnosis Date   Allergy    Anxiety    BPH (benign prostatic hypertrophy)    Cancer (HCC)    Melanoma on Neck    2008   Carotid artery occlusion    CHF (congestive heart failure) (HCC)    Diabetes mellitus    type 2   ED (erectile dysfunction)    GERD (gastroesophageal reflux disease)    Hemorrhagic stroke (HCC) 06/2021   Hyperlipidemia    Hypertension    Lung cancer (HCC)    Neoplasm related pain    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)    Stroke [facility])    Past Surgical History:  Procedure Laterality Date   ANTERIOR CERVICAL DECOMP/DISCECTOMY FUSION N/A 02/09/2023   Procedure: C3-5 ANTERIOR CERVICAL DISCECTOMY AND FUSION;  Surgeon: Venetia Night, MD;  Location: [facility] ORS;  Service: Neurosurgery;  Laterality: N/A;   BRONCHIAL NEEDLE ASPIRATION BIOPSY  06/17/2021   Procedure: BRONCHIAL NEEDLE ASPIRATION BIOPSIES;  Surgeon: Lorin Glass, MD;  Location: [facility] ENDOSCOPY;  Service: Pulmonary;;   IR IMAGING GUIDED PORT INSERTION  08/05/2021   MELANOMA EXCISION  2008   Left side of neck   RADIOLOGY WITH ANESTHESIA N/A 04/05/2020   Procedure: MRI SPINE WITOUT CONTRAST;  Surgeon: Radiologist, Medication, MD;  [facility]: [facility];  Service: Radiology;  Laterality: N/A;   RADIOLOGY WITH ANESTHESIA N/A 08/22/2021   Procedure: MRI BRAIN WITH AND WITHOUT CONTRAST  WITH ANESTHESIA;  Surgeon:  Radiologist, Medication, MD;  Location: [facility];  Service: Radiology;  Laterality: N/A;   RADIOLOGY WITH ANESTHESIA N/A 12/17/2021   Procedure: MRI BRAIN WITH  AND WITHOUT CONTRAST WITH ANESTHESIA; MRI LUMBER WITH AND WITHOUT CONTRAST;  Surgeon: Radiologist, Medication, MD;  [facility];  Service: Radiology;  Laterality: N/A;   RADIOLOGY WITH ANESTHESIA N/A 04/08/2022   Procedure: MRI LUMBER SPINE WITH AND WITHOUT CONTRAST;  Surgeon: Radiologist, Medication, MD;  [facility];  Service: Radiology;  Laterality: N/A;   TONSILLECTOMY     VIDEO BRONCHOSCOPY WITH ENDOBRONCHIAL ULTRASOUND N/A 06/17/2021   Procedure: VIDEO BRONCHOSCOPY WITH ENDOBRONCHIAL ULTRASOUND;  Surgeon: Lorin Glass, MD;  Location: [facility] ENDOSCOPY;  Service: Pulmonary;  Laterality: N/A;   Current Outpatient Medications on File Prior to Visit  Medication Sig Dispense Refill   ALPRAZolam (XANAX) 0.5 MG tablet Take 1 tablet (0.5 mg total) by mouth at bedtime as needed for anxiety. 15 tablet 0   celecoxib (CELEBREX) 200 MG capsule Take 1 capsule (200 mg total) by mouth 2 (two) times daily. 60 capsule 0   citalopram (CELEXA) 10 MG tablet Take 1 tablet (10 mg total) by mouth daily. 30 tablet 3   docusate sodium (COLACE) 100 MG capsule Take 100 mg by mouth 2 (two) times daily as needed for mild constipation.     fentaNYL (DURAGESIC) 75 MCG/HR Place 1 patch onto the skin every 3 (three) days. 10 patch 0   gabapentin (NEURONTIN) 300 MG capsule Take 2 capsules (600 mg total) by mouth 2 (two) times daily. 120 capsule 2   insulin glargine (LANTUS) 100 UNIT/ML injection Inject 0-30 Units into the skin daily as needed (High Blood Sugar over 150).     Insulin Pen Needle (B-D UF III MINI PEN NEEDLES) 31G X 5 MM MISC Use to inject insulin daily. 100 each 3   megestrol (MEGACE) 40 MG tablet Take 1 tablet (40 mg total) by mouth daily. 30 tablet 3   methocarbamol (ROBAXIN) 500 MG tablet Take 1 tablet (500 mg total) by mouth every 6 (six) hours as needed for muscle spasms. 120 tablet 0   midodrine (PROAMATINE) 10 MG tablet Take 1 tablet (10 mg total) by mouth 3 (three) times daily as needed (Systolic BP  less than 110 mmHg). 90 tablet 0   NOVOLOG FLEXPEN 100 UNIT/ML FlexPen Inject 0-10 Units into the skin daily as needed for high blood sugar (BG > 200).     Nystatin (GERHARDT'S BUTT CREAM) CREA Apply 14 Applications topically 2 (two) times daily. 1 each 0   omeprazole (PRILOSEC) 20 MG capsule Take 20 mg by mouth daily.     oxyCODONE-acetaminophen (PERCOCET) 7.5-325 MG tablet Take 1 tablet by mouth every 4 (four) hours as needed for severe pain (pain score 7-10). Do not take with cough medication or other pain medication 60 tablet 0   senna (SENOKOT) 8.6 MG TABS tablet Take 1 tablet (8.6 mg total) by mouth 2 (two) times daily as needed for mild constipation. 30 tablet 0   tamsulosin (FLOMAX) 0.4 MG CAPS capsule Take 1 capsule (0.4 mg total) by mouth daily. 30 capsule 2   [DISCONTINUED] prochlorperazine (COMPAZINE) 10 MG tablet Take 1 tablet (10 mg total) by mouth every 6 (six) hours as needed (Nausea or vomiting). 30 tablet 1   Current Facility-Administered Medications on File Prior to Visit  Medication Dose Route Frequency Provider Last Rate Last Admin   heparin lock flush 100 UNIT/ML injection  Allergies  Allergen Reactions   Niaspan [Niacin]     FLUSHING   Social History   Socioeconomic History   Marital status: Married    Spouse name: Not on file   Number of children: Not on file   Years of education: Not on file   Highest education level: Not on file  Occupational History   Not on file  Tobacco Use   Smoking status: Former    Current packs/day: 0.00    Types: Cigarettes    Quit date: 07/21/1990    Years since quitting: 32.7   Smokeless tobacco: Never  Vaping Use   Vaping status: Never Used  Substance and Sexual Activity   Alcohol use: No   Drug use: No   Sexual activity: Not Currently  Other Topics Concern   Not on file  Social History Narrative   Not on file   Social Drivers of Health   Financial Resource Strain: Low Risk  (04/17/2022)   Overall Financial  Resource Strain (CARDIA)    Difficulty of Paying Living Expenses: Not hard at all  Food Insecurity: No Food Insecurity (04/22/2023)   Hunger Vital Sign    Worried About Running Out of Food in the Last Year: Never true    Ran Out of Food in the Last Year: Never true  Transportation Needs: No Transportation Needs (04/22/2023)   PRAPARE - Administrator, Civil Service (Medical): No    Lack of Transportation (Non-Medical): No  Physical Activity: Inactive (03/27/2022)   Exercise Vital Sign    Days of Exercise per Week: 0 days    Minutes of Exercise per Session: 0 min  Stress: Stress Concern Present (03/27/2022)   [employer] of Occupational Health - Occupational Stress Questionnaire    Feeling of Stress : To some extent  Social Connections: Patient Unable To Answer (04/19/2023)   Social Connection and Isolation Panel [NHANES]    Frequency of Communication with Friends and Family: Patient unable to answer    Frequency of Social Gatherings with Friends and Family: Patient unable to answer    Attends Religious Services: Patient unable to answer    Active Member of Clubs or Organizations: Patient unable to answer    Attends Banker Meetings: Patient unable to answer    Marital Status: Patient unable to answer  Intimate Partner Violence: Not At Risk (04/22/2023)   Humiliation, Afraid, Rape, and Kick questionnaire    Fear of Current or Ex-Partner: No    Emotionally Abused: No    Physically Abused: No    Sexually Abused: No      Review of Systems  Musculoskeletal:  Positive for back pain.  All other systems reviewed and are negative.      Objective:   Physical Exam Vitals reviewed.  Constitutional:      General: He is not in acute distress.    Appearance: He is underweight. He is ill-appearing.  Cardiovascular:     Rate and Rhythm: Normal rate and regular rhythm.     Pulses: Decreased pulses.          Dorsalis pedis pulses are 0 on the right side and 0 on  the left side.       Posterior tibial pulses are 0 on the right side and 1+ on the left side.     Heart sounds: Normal heart sounds. No murmur heard.    No friction rub. No gallop.  Pulmonary:     Effort: Pulmonary effort is normal.  No respiratory distress.     Breath sounds: Normal breath sounds. No stridor. No wheezing, rhonchi or rales.  Musculoskeletal:     Right lower leg: No edema.     Left lower leg: No edema.  Neurological:     General: No focal deficit present.     Mental Status: He is alert and oriented to person, place, and time.     Sensory: Sensory deficit present.     Motor: Weakness present. No tremor, seizure activity or pronator drift.     Gait: Gait abnormal.           Assessment & Plan:  Type 2 diabetes mellitus with hyperglycemia, without long-term current use of insulin (HCC) - Plan: VAS [place] ABI WITH/WO TBI  Neuropathy - Plan: VAS [place] ABI WITH/WO TBI  Seizure disorder (HCC)  Absent pedal pulses - Plan: VAS [place] ABI WITH/WO TBI First, we discussed sliding scale insulin.  I recommended that he discontinue Lantus.  I recommended that he start checking his blood sugars 3-4 times a day.  I also wrote out for the patient sliding scale.  He will take 2 units of NovoLog for every 50 points above 150 that his sugar runs.  For instance, if his sugar is under 150 he will not taking NovoLog.  Between 151 and 200 he will take 2 units, between 201 and 250 he will take 4 units, between 251 and 300 he will take 6 units.  This should hopefully reduce the risk of hypoglycemia.  I emphasized to the patient that he needs to take his Keppra and I refilled the prescription for him.  He states that he will start to take the medication.  I am concerned based on his exam that the cold sensation in his feet may be due to claudication rather than neuropathy.  I will check ABIs with an ultrasound to determine if there is evidence of peripheral vascular disease

## 2023-04-29 ENCOUNTER — Telehealth: Payer: Self-pay | Admitting: *Deleted

## 2023-04-29 ENCOUNTER — Other Ambulatory Visit: Payer: Self-pay

## 2023-04-29 ENCOUNTER — Other Ambulatory Visit: Payer: Self-pay | Admitting: *Deleted

## 2023-04-29 DIAGNOSIS — G894 Chronic pain syndrome: Secondary | ICD-10-CM | POA: Diagnosis not present

## 2023-04-29 DIAGNOSIS — G959 Disease of spinal cord, unspecified: Secondary | ICD-10-CM

## 2023-04-29 NOTE — Patient Outreach (Signed)
 Care Management  Transitions of Care Program Transitions of Care Post-discharge week 2  04/29/2023 Name: Micheal Donovan MRN: [id number] DOB: 04-19-1950  Subjective: Micheal Donovan is a 73 y.o. year old male who is a primary care patient of Donita Brooks, MD. The Care Management team was unable to reach the patient by phone to assess and address transitions of care needs.   Plan: Additional outreach attempts will be made to reach the patient enrolled in the [facility] Program (Post Inpatient/ED Visit).  [name] Shows [facility], BSN RN Care Manager/ Transition of Care [place]/ [facility] [phone]

## 2023-04-30 ENCOUNTER — Telehealth: Payer: Self-pay | Admitting: *Deleted

## 2023-04-30 ENCOUNTER — Ambulatory Visit (INDEPENDENT_AMBULATORY_CARE_PROVIDER_SITE_OTHER): Payer: PPO | Admitting: Neurosurgery

## 2023-04-30 ENCOUNTER — Ambulatory Visit
Admission: RE | Admit: 2023-04-30 | Discharge: 2023-04-30 | Disposition: A | Discharge status: Home / Self Care | Payer: PPO | Source: Ambulatory Visit | Attending: Neurosurgery

## 2023-04-30 ENCOUNTER — Ambulatory Visit
Admission: RE | Admit: 2023-04-30 | Discharge: 2023-04-30 | Disposition: A | Discharge status: Home / Self Care | Payer: PPO | Source: Ambulatory Visit | Attending: Neurosurgery | Admitting: Neurosurgery

## 2023-04-30 VITALS — BP 102/62 | Ht 67.0 in | Wt 129.0 lb

## 2023-04-30 DIAGNOSIS — M4802 Spinal stenosis, cervical region: Secondary | ICD-10-CM

## 2023-04-30 DIAGNOSIS — G959 Disease of spinal cord, unspecified: Secondary | ICD-10-CM | POA: Diagnosis not present

## 2023-04-30 DIAGNOSIS — Z981 Arthrodesis status: Secondary | ICD-10-CM | POA: Diagnosis not present

## 2023-04-30 NOTE — Patient Outreach (Signed)
 Care Management  Transitions of Care Program Transitions of Care Post-discharge week 2- 2nd attempt  04/30/2023 Name: Micheal Donovan MRN: [id number] DOB: 14-May-1950  Subjective: Micheal Donovan is a 73 y.o. year old male who is a primary care patient of Donita Brooks, MD. The Care Management team was unable to reach the patient by phone to assess and address transitions of care needs.   Plan: Additional outreach attempts will be made to reach the patient enrolled in the [facility] Program (Post Inpatient/ED Visit).  [name] Shows [facility], BSN RN Care Manager/ Transition of Care [place]/ [facility] [phone]

## 2023-04-30 NOTE — Progress Notes (Signed)
   REFERRING PHYSICIAN:  Donita Brooks, Md [address] [place],  [place] [address]  DOS: 02/09/23 C3-5 ACDF  HISTORY OF PRESENT ILLNESS:  04/30/23 Mr. Trotter presents today for 38-month follow-up after his recent ACDF.  He spoke with Dr. Myer Haff at his last visit about possibility of considering a spinal cord stimulator and a referral to Dr. Cherylann Ratel was placed.  He is currently scheduled to see him on 05/12/2023. Today he reports he is doing well without any significant neck pain.  He continues to have significant back pain.  02/13/23 Dr. Elray Mcgregor is status post ACDF for cervical stenosis and myelopathy.  He has seen some improvement in his strength.  He is having a significant issue with walking due to his pain.  His cancer is stable.   PHYSICAL EXAMINATION:  NEUROLOGICAL:  General: In no acute distress.   Awake, alert, oriented to person, place, and time.  Pupils equal round and reactive to light.  Facial tone is symmetric.  Tongue protrusion is midline.    Strength: Side Biceps Triceps Deltoid Interossei Grip Wrist Ext. Wrist Flex.  R 5 5 5 5 5 5 5   L 5 5 5 5 5 5 5     Incision c/d/I and healing well  Imaging:  04/30/23 cervical xrays Without any evidence of hardware malfunction  Assessment / Plan: CANNAN BEECK is doing well after recent ACDF.  He is overall doing very well.  I recommended that he follow-up with Dr. Cherylann Ratel as scheduled to discuss if he is a candidate for spinal cord stimulation.  He can return to clinic in 6 months with cervical x-rays prior to see Dr. Marcell Barlow or sooner should he have any questions or concerns.  He expressed understanding and was in agreement with this plan.  Manning Charity PA-C  Dept of Neurosurgery

## 2023-05-01 ENCOUNTER — Telehealth: Payer: Self-pay | Admitting: *Deleted

## 2023-05-01 ENCOUNTER — Encounter: Payer: Self-pay | Admitting: Family Medicine

## 2023-05-01 ENCOUNTER — Telehealth: Payer: Self-pay

## 2023-05-01 ENCOUNTER — Encounter: Payer: Self-pay | Admitting: *Deleted

## 2023-05-01 ENCOUNTER — Telehealth: Payer: Self-pay | Admitting: Neurosurgery

## 2023-05-01 NOTE — Telephone Encounter (Signed)
 Micheal Donovan from [facility] is calling to request verbal PT orders of 2x4.

## 2023-05-01 NOTE — Telephone Encounter (Signed)
 This is fine (we are the ones that referred him to [facility]PT)

## 2023-05-01 NOTE — Telephone Encounter (Signed)
 I tried to reach [facility] at the number that was given and it was the wrong number. I called [facility] and they don't know a Corrie Dandy. She was going to put a message in to the [place] location to see if they could locate [facility].

## 2023-05-01 NOTE — Patient Outreach (Signed)
 Care Management  Transitions of Care Program Transitions of Care Post-discharge week 2   05/01/2023 Name: Micheal Donovan MRN: [id number] DOB: 27-Mar-1950  Subjective: Micheal Donovan is a 73 y.o. year old male who is a primary care patient of Pickard, Priscille Heidelberg, MD. The Care Management team Engaged with patient Engaged with patient by telephone to assess and address transitions of care needs.   Consent to Services:  Patient was given information about care management services, agreed to services, and gave verbal consent to participate.   Assessment: Patient is checking blood pressure every other day (instead of 3 x per day), pt states he will try to start checking 3 x per day and take midodrine as prescribed, reports systolic ranges 115-120, pt does not feel as weak, pt states [facility] is seeing him in the home and this is going well, pt is still unable to weigh, feels too unsteady, is in wheelchair most of time, has walker that he is also using.         SDOH Interventions    Flowsheet Row Telephone from 04/22/2023 in [place] [facility]PARTMENT Clinical Support from 03/27/2022 in [facility] Telephone from 11/21/2021 in [facility] Community Care Coordination Clinical Support from 07/31/2021 in [facility] Dept Of [place]  SDOH Interventions      Food Insecurity Interventions Intervention Not Indicated Intervention Not Indicated -- Intervention Not Indicated  Housing Interventions Intervention Not Indicated Intervention Not Indicated -- Intervention Not Indicated  Transportation Interventions Intervention Not Indicated Intervention Not Indicated Intervention Not Indicated CCAR Zenaida Niece ([facility] Only), Patient Resources (Friends/Family)  Utilities Interventions Intervention Not Indicated Intervention Not Indicated -- --  Alcohol Usage Interventions -- Intervention Not Indicated (Score  <7) -- --  Financial Strain Interventions -- Intervention Not Indicated Intervention Not Indicated Intervention Not Indicated  Physical Activity Interventions -- Intervention Not Indicated -- Intervention Not Indicated  Stress Interventions -- Intervention Not Indicated -- --  Social Connections Interventions -- Intervention Not Indicated -- Intervention Not Indicated        Goals Addressed             This Visit's Progress    Transition of Care/ pt will have no readmissions within 30 days       Current Barriers:  Home Health services - [facility] is working with patient in the home Knowledge Deficits related to plan of care for management of Seizure, HF  Patient reports he is eating and drinking well, does not feel as weak has not been ambulating much, uses wheelchair most of time, has walker to use, pt is checking blood pressure every other day (not 3 x per day),verbalizes understanding to take midodrine for systolic <110- 3 x per day, pt states systolic readings all are 115-120, states " can't remember the bottom number right now", denies any seizure activity since being home from hospital. Patient saw primary care provider on 04/28/23, neurology on 2/27.  RNCM Clinical Goal(s):  Patient will work with the Care Management team over the next 30 days to address Transition of Care Barriers: Home Health services verbalize understanding of plan for management of Seizure, HF as evidenced by patient report, review of EMR and  through collaboration with RN Care manager, provider, and care team.   Interventions: Evaluation of current treatment plan related to  self management and patient's adherence to plan as established by  provider   Heart Failure Interventions:  (Status:  Goal on track:  Yes.) Short Term Goal Discussed the importance of keeping all appointments with provider Reviewed importance of weighing daily only when pt is able Reviewed hypotension can make patient feel  weak Reinforced heart failure action plan Reinforced low sodium diet  Seizure  (Status:  Goal on track:  NO. Pt is not checking blood pressure 3 x day)  Short Term Goal Evaluation of current treatment plan related to  Seizure ,  home health, self-management and patient's adherence to plan as established by provider. Discussed plans with patient for ongoing care management follow up and provided patient with direct contact information for care management team Reviewed importance of taking Keppra exactly as prescribed and do not miss any doses Reinforced signs /symptoms of seizure, reportable signs/ symptoms Reinforced importance of continuing to monitor blood pressure throughout the day (at least 3 x per day) and take midodrine for systolic <110, pt verbalizes understanding Reinforced safety precautions and use walker at all times, do not ambulate if dizzy or weak Verified with patient that [facility] services have started Reviewed safety precautions  Patient Goals/Self-Care Activities: Participate in Transition of Care Program/Attend [facility] scheduled calls Notify RN Care Manager of TOC call rescheduling needs Take all medications as prescribed Attend all scheduled provider appointments Call pharmacy for medication refills 3-7 days in advance of running out of medications Call provider office for new concerns or questions  call office if I gain more than 2 pounds in one day or 5 pounds in one week use salt in moderation watch for swelling in feet, ankles and legs every day develop a rescue plan follow rescue plan if symptoms flare-up eat more whole grains, fruits and vegetables, lean meats and healthy fats dress right for the weather, hot or cold Weigh daily only when you are able, please record in a log Check blood pressure as instructed (at least 3 x per day) take midodrine as prescribed for systolic (top number) <110 Drink adequate fluids, preferably water Continue working  with [facility] Take keppra exactly as prescribed, report any seizure activity to your doctor, call 911 if needed Low blood pressure can make you feel weak Fall prevention strategies: change positions slowly, use a walker or cane (per provider recommendations) when walking, keep walkways clear, have good lighting in room. It is important to contact your provider if you have any falls. Maintain muscle strength/tone by exercise per provider recommendations.   Follow Up Plan:  Telephone follow up appointment with care management team member scheduled for:  05/08/23 @ 115 pm          Plan: Telephone follow up appointment with care management team member scheduled for: 05/08/23 @ 115 pm  [name] Shows [facility], BSN RN Care Manager/ Transition of Care [place]/ [facility] Population [facility] [phone]

## 2023-05-01 NOTE — Patient Instructions (Signed)
 Visit Information  Thank you for taking time to visit with me today. Please don't hesitate to contact me if I can be of assistance to you before our next scheduled telephone appointment.  Following are the goals we discussed today:   Goals Addressed             This Visit's Progress    Transition of Care/ pt will have no readmissions within 30 days       Current Barriers:  Home Health services - [facility] is working with patient in the home Knowledge Deficits related to plan of care for management of Seizure, HF  Patient reports he is eating and drinking well, does not feel as weak has not been ambulating much, uses wheelchair most of time, has walker to use, pt is checking blood pressure every other day (not 3 x per day),verbalizes understanding to take midodrine for systolic <110- 3 x per day, pt states systolic readings all are 115-120, states " can't remember the bottom number right now", denies any seizure activity since being home from hospital. Patient saw primary care provider on 04/28/23, neurology on 2/27.  RNCM Clinical Goal(s):  Patient will work with the Care Management team over the next 30 days to address Transition of Care Barriers: Home Health services verbalize understanding of plan for management of Seizure, HF as evidenced by patient report, review of EMR and  through collaboration with RN Care manager, provider, and care team.   Interventions: Evaluation of current treatment plan related to  self management and patient's adherence to plan as established by provider   Heart Failure Interventions:  (Status:  Goal on track:  Yes.) Short Term Goal Discussed the importance of keeping all appointments with provider Reviewed importance of weighing daily only when pt is able Reviewed hypotension can make patient feel weak Reinforced heart failure action plan Reinforced low sodium diet  Seizure  (Status:  Goal on track:  NO. Pt is not checking blood pressure 3  x day)  Short Term Goal Evaluation of current treatment plan related to  Seizure ,  home health, self-management and patient's adherence to plan as established by provider. Discussed plans with patient for ongoing care management follow up and provided patient with direct contact information for care management team Reviewed importance of taking Keppra exactly as prescribed and do not miss any doses Reinforced signs /symptoms of seizure, reportable signs/ symptoms Reinforced importance of continuing to monitor blood pressure throughout the day (at least 3 x per day) and take midodrine for systolic <110, pt verbalizes understanding Reinforced safety precautions and use walker at all times, do not ambulate if dizzy or weak Verified with patient that [facility] services have started Reviewed safety precautions  Patient Goals/Self-Care Activities: Participate in Transition of Care Program/Attend [facility] scheduled calls Notify RN Care Manager of TOC call rescheduling needs Take all medications as prescribed Attend all scheduled provider appointments Call pharmacy for medication refills 3-7 days in advance of running out of medications Call provider office for new concerns or questions  call office if I gain more than 2 pounds in one day or 5 pounds in one week use salt in moderation watch for swelling in feet, ankles and legs every day develop a rescue plan follow rescue plan if symptoms flare-up eat more whole grains, fruits and vegetables, lean meats and healthy fats dress right for the weather, hot or cold Weigh daily only when you are able, please record in a log Check  blood pressure as instructed (at least 3 x per day) take midodrine as prescribed for systolic (top number) <110 Drink adequate fluids, preferably water Continue working with [facility] Take keppra exactly as prescribed, report any seizure activity to your doctor, call 911 if needed Low blood pressure can  make you feel weak Fall prevention strategies: change positions slowly, use a walker or cane (per provider recommendations) when walking, keep walkways clear, have good lighting in room. It is important to contact your provider if you have any falls. Maintain muscle strength/tone by exercise per provider recommendations.   Follow Up Plan:  Telephone follow up appointment with care management team member scheduled for:  05/08/23 @ 115 pm           Our next appointment is by telephone on 05/08/23 at 115 pm  Please call the care guide team at [phone] if you need to cancel or reschedule your appointment.   If you are experiencing a Mental Health or Behavioral Health Crisis or need someone to talk to, please call the Suicide and Crisis Lifeline: 988 call the [place] National Suicide Prevention Lifeline: 782-038-1590 or TTY: 361-177-9821 TTY 365-787-3695) to talk to a trained counselor call 1-800-273-TALK (toll free, 24 hour hotline) go to [facility] [address] [place] [phone]) call the [facility] Crisis Line: [phone] call 911   Patient verbalizes understanding of instructions and care plan provided today and agrees to view in MyChart. Active MyChart status and patient understanding of how to access instructions and care plan via MyChart confirmed with patient.     Telephone follow up appointment with care management team member scheduled for: 05/08/23 @ 115 pm  Micheal Donovan [facility], BSN RN Care Manager/ Transition of Care [place]/ [facility] [phone]

## 2023-05-01 NOTE — Telephone Encounter (Signed)
 Copied from CRM [id number]. Topic: Clinical - Home Health Verbal Orders >> May 01, 2023  1:21 PM Geroge Baseman wrote: Caller/Agency: [facility]/Stephanie Callback Number: [phone] Service Requested: Occupational Therapy Frequency: N/A Any new concerns about the patient? Patient refused OT this week, they want it to be next week  Called LVM for Stephanie/[facility]:   Verbal orders for OT next week since pt refused OT this week on behalf of pcp.

## 2023-05-03 ENCOUNTER — Other Ambulatory Visit: Payer: Self-pay | Admitting: Hospice and Palliative Medicine

## 2023-05-03 DIAGNOSIS — G893 Neoplasm related pain (acute) (chronic): Secondary | ICD-10-CM

## 2023-05-03 DIAGNOSIS — C3412 Malignant neoplasm of upper lobe, left bronchus or lung: Secondary | ICD-10-CM

## 2023-05-04 ENCOUNTER — Encounter: Payer: Self-pay | Admitting: Oncology

## 2023-05-04 MED ORDER — OXYCODONE-ACETAMINOPHEN 7.5-325 MG PO TABS: 1.0000 | ORAL_TABLET | ORAL | 0 refills | Status: DC | PRN

## 2023-05-04 MED ORDER — ALPRAZOLAM 0.5 MG PO TABS: 0.5000 mg | ORAL_TABLET | Freq: Every evening | ORAL | 0 refills | Status: DC | PRN

## 2023-05-04 MED ORDER — FENTANYL 75 MCG/HR TD PT72: 1.0000 | MEDICATED_PATCH | TRANSDERMAL | 0 refills | Status: DC

## 2023-05-05 ENCOUNTER — Inpatient Hospital Stay (HOSPITAL_BASED_OUTPATIENT_CLINIC_OR_DEPARTMENT_OTHER): Payer: PPO | Admitting: Oncology

## 2023-05-05 ENCOUNTER — Inpatient Hospital Stay: Payer: PPO

## 2023-05-05 ENCOUNTER — Encounter: Payer: Self-pay | Admitting: Oncology

## 2023-05-05 ENCOUNTER — Inpatient Hospital Stay: Payer: PPO | Attending: Radiation Oncology

## 2023-05-05 VITALS — BP 121/64 | HR 71 | Temp 97.2°F | Resp 19

## 2023-05-05 VITALS — BP 96/69 | HR 91 | Temp 96.3°F | Resp 18 | Wt 127.1 lb

## 2023-05-05 DIAGNOSIS — R569 Unspecified convulsions: Secondary | ICD-10-CM | POA: Diagnosis not present

## 2023-05-05 DIAGNOSIS — G40909 Epilepsy, unspecified, not intractable, without status epilepticus: Secondary | ICD-10-CM | POA: Diagnosis not present

## 2023-05-05 DIAGNOSIS — Z8673 Personal history of transient ischemic attack (TIA), and cerebral infarction without residual deficits: Secondary | ICD-10-CM | POA: Insufficient documentation

## 2023-05-05 DIAGNOSIS — E785 Hyperlipidemia, unspecified: Secondary | ICD-10-CM | POA: Insufficient documentation

## 2023-05-05 DIAGNOSIS — Z87440 Personal history of urinary (tract) infections: Secondary | ICD-10-CM | POA: Diagnosis not present

## 2023-05-05 DIAGNOSIS — Z794 Long term (current) use of insulin: Secondary | ICD-10-CM | POA: Insufficient documentation

## 2023-05-05 DIAGNOSIS — C3412 Malignant neoplasm of upper lobe, left bronchus or lung: Secondary | ICD-10-CM | POA: Diagnosis not present

## 2023-05-05 DIAGNOSIS — G893 Neoplasm related pain (acute) (chronic): Secondary | ICD-10-CM | POA: Insufficient documentation

## 2023-05-05 DIAGNOSIS — Z791 Long term (current) use of non-steroidal anti-inflammatories (NSAID): Secondary | ICD-10-CM | POA: Insufficient documentation

## 2023-05-05 DIAGNOSIS — I700 Atherosclerosis of aorta: Secondary | ICD-10-CM | POA: Insufficient documentation

## 2023-05-05 DIAGNOSIS — Z87891 Personal history of nicotine dependence: Secondary | ICD-10-CM | POA: Diagnosis not present

## 2023-05-05 DIAGNOSIS — Z85820 Personal history of malignant melanoma of skin: Secondary | ICD-10-CM | POA: Diagnosis not present

## 2023-05-05 DIAGNOSIS — I509 Heart failure, unspecified: Secondary | ICD-10-CM | POA: Insufficient documentation

## 2023-05-05 DIAGNOSIS — G629 Polyneuropathy, unspecified: Secondary | ICD-10-CM | POA: Diagnosis not present

## 2023-05-05 DIAGNOSIS — I619 Nontraumatic intracerebral hemorrhage, unspecified: Secondary | ICD-10-CM | POA: Diagnosis not present

## 2023-05-05 DIAGNOSIS — I110 Hypertensive heart disease with heart failure: Secondary | ICD-10-CM | POA: Diagnosis not present

## 2023-05-05 DIAGNOSIS — N400 Enlarged prostate without lower urinary tract symptoms: Secondary | ICD-10-CM | POA: Insufficient documentation

## 2023-05-05 DIAGNOSIS — K219 Gastro-esophageal reflux disease without esophagitis: Secondary | ICD-10-CM | POA: Insufficient documentation

## 2023-05-05 DIAGNOSIS — C7951 Secondary malignant neoplasm of bone: Secondary | ICD-10-CM | POA: Diagnosis not present

## 2023-05-05 DIAGNOSIS — C7931 Secondary malignant neoplasm of brain: Secondary | ICD-10-CM | POA: Diagnosis not present

## 2023-05-05 DIAGNOSIS — Z5112 Encounter for antineoplastic immunotherapy: Secondary | ICD-10-CM

## 2023-05-05 DIAGNOSIS — E1140 Type 2 diabetes mellitus with diabetic neuropathy, unspecified: Secondary | ICD-10-CM | POA: Diagnosis not present

## 2023-05-05 DIAGNOSIS — Z79899 Other long term (current) drug therapy: Secondary | ICD-10-CM | POA: Insufficient documentation

## 2023-05-05 LAB — COMPREHENSIVE METABOLIC PANEL
ALT: 9 U/L (ref 0–44)
AST: 17 U/L (ref 15–41)
Albumin: 3.6 g/dL (ref 3.5–5.0)
Alkaline Phosphatase: 63 U/L (ref 38–126)
Anion gap: 9 (ref 5–15)
BUN: 23 mg/dL (ref 8–23)
CO2: 26 mmol/L (ref 22–32)
Calcium: 8.8 mg/dL — ABNORMAL LOW (ref 8.9–10.3)
Chloride: 99 mmol/L (ref 98–111)
Creatinine, Ser: 1.01 mg/dL (ref 0.61–1.24)
GFR, Estimated: >60 mL/min (ref >60)
Glucose, Bld: 182 mg/dL — ABNORMAL HIGH (ref 70–99)
Potassium: 4.3 mmol/L (ref 3.5–5.1)
Sodium: 134 mmol/L — ABNORMAL LOW (ref 135–145)
Total Bilirubin: 0.5 mg/dL (ref 0.0–1.2)
Total Protein: 6.6 g/dL (ref 6.5–8.1)

## 2023-05-05 LAB — CBC WITH DIFFERENTIAL/PLATELET
Abs Immature Granulocytes: 0.07 K/uL (ref 0.00–0.07)
Basophils Absolute: 0.1 K/uL (ref 0.0–0.1)
Basophils Relative: 1 %
Eosinophils Absolute: 0.2 K/uL (ref 0.0–0.5)
Eosinophils Relative: 2 %
HCT: 38.6 % — ABNORMAL LOW (ref 39.0–52.0)
Hemoglobin: 12.5 g/dL — ABNORMAL LOW (ref 13.0–17.0)
Immature Granulocytes: 1 %
Lymphocytes Relative: 10 %
Lymphs Abs: 0.9 K/uL (ref 0.7–4.0)
MCH: 29.7 pg (ref 26.0–34.0)
MCHC: 32.4 g/dL (ref 30.0–36.0)
MCV: 91.7 fL (ref 80.0–100.0)
Monocytes Absolute: 0.7 K/uL (ref 0.1–1.0)
Monocytes Relative: 8 %
Neutro Abs: 6.5 K/uL (ref 1.7–7.7)
Neutrophils Relative %: 78 %
Platelets: 239 K/uL (ref 150–400)
RBC: 4.21 MIL/uL — ABNORMAL LOW (ref 4.22–5.81)
RDW: 13.6 % (ref 11.5–15.5)
WBC: 8.3 K/uL (ref 4.0–10.5)
nRBC: 0 % (ref 0.0–0.2)

## 2023-05-05 MED ORDER — SODIUM CHLORIDE 0.9 % IV SOLN
200.0000 mg | Freq: Once | INTRAVENOUS | Status: AC
  Administered 2023-05-05: 200 mg via INTRAVENOUS
  Filled 2023-05-05: qty 8

## 2023-05-05 MED ORDER — HEPARIN SOD (PORK) LOCK FLUSH 100 UNIT/ML IV SOLN
500.0000 [IU] | Freq: Once | INTRAVENOUS | Status: AC | PRN
  Administered 2023-05-05: 500 [IU]
  Filled 2023-05-05: qty 5

## 2023-05-05 MED ORDER — SODIUM CHLORIDE 0.9 % IV SOLN
Freq: Once | INTRAVENOUS | Status: AC
  Filled 2023-05-05: qty 250

## 2023-05-05 MED ORDER — ZOLEDRONIC ACID 4 MG/100ML IV SOLN
4.0000 mg | Freq: Once | INTRAVENOUS | Status: AC
  Administered 2023-05-05: 4 mg via INTRAVENOUS
  Filled 2023-05-05: qty 100

## 2023-05-05 NOTE — Assessment & Plan Note (Signed)
 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  S/p lung radiation to left lung.  January 2025, CT and PET scan results were reviewed. Stable disease. No progression or new disease.  Labs are reviewed and discussed with patient. I recommend to continue [facility]

## 2023-05-05 NOTE — Assessment & Plan Note (Signed)
 Immunotherapy plan as listed above

## 2023-05-05 NOTE — Patient Instructions (Signed)
 [facility] - A DEPT OF [facility]  Discharge Instructions: Thank you for choosing [facility] to provide your oncology and hematology care.  If you have a lab appointment with the Cancer Center, please go directly to the Cancer Center and check in at the registration area.  Wear comfortable clothing and clothing appropriate for easy access to any Portacath or PICC line.   We strive to give you quality time with your provider. You may need to reschedule your appointment if you arrive late (15 or more minutes).  Arriving late affects you and other patients whose appointments are after yours.  Also, if you miss three or more appointments without notifying the office, you may be dismissed from the clinic at the provider's discretion.      For prescription refill requests, have your pharmacy contact our office and allow 72 hours for refills to be completed.    Today you received the following chemotherapy and/or immunotherapy agents keytruda and zometa      To help prevent nausea and vomiting after your treatment, we encourage you to take your nausea medication as directed.  BELOW ARE SYMPTOMS THAT SHOULD BE REPORTED IMMEDIATELY: *FEVER GREATER THAN 100.4 F (38 C) OR HIGHER *CHILLS OR SWEATING *NAUSEA AND VOMITING THAT IS NOT CONTROLLED WITH YOUR NAUSEA MEDICATION *UNUSUAL SHORTNESS OF BREATH *UNUSUAL BRUISING OR BLEEDING *URINARY PROBLEMS (pain or burning when urinating, or frequent urination) *BOWEL PROBLEMS (unusual diarrhea, constipation, pain near the anus) TENDERNESS IN MOUTH AND THROAT WITH OR WITHOUT PRESENCE OF ULCERS (sore throat, sores in mouth, or a toothache) UNUSUAL RASH, SWELLING OR PAIN  UNUSUAL VAGINAL DISCHARGE OR ITCHING   Items with * indicate a potential emergency and should be followed up as soon as possible or go to the Emergency Department if any problems should occur.  Please show the CHEMOTHERAPY ALERT CARD or  IMMUNOTHERAPY ALERT CARD at check-in to the Emergency Department and triage nurse.  Should you have questions after your visit or need to cancel or reschedule your appointment, please contact [facility] - A DEPT OF [facility]igh[facility]  [phone] and follow the prompts.  Office hours are 8:00 a.m. to 4:30 p.m. Monday - Friday. Please note that voicemails left after 4:00 p.m. may not be returned until the following business day.  We are closed weekends and major holidays. You have access to a nurse at all times for urgent questions. Please call the main number to the clinic [phone] and follow the prompts.  For any non-urgent questions, you may also contact your provider using MyChart. We now offer e-Visits for anyone 82 and older to request care online for non-urgent symptoms. For details visit [email or web address].   Also download the MyChart app! Go to the app store, search "MyChart", open the app, select [place], and log in with your MyChart username and password.

## 2023-05-05 NOTE — Assessment & Plan Note (Addendum)
 On Keppra.  Recommend patient to follow up with neurologist.

## 2023-05-05 NOTE — Assessment & Plan Note (Signed)
 Fingertip neuropathy intermittent Grade 1, feet neuropathy persistent, Grade 2.  On Gabapentin 300mg  TID

## 2023-05-05 NOTE — Progress Notes (Signed)
 Hematology/Oncology Progress note [phone] [phone]       CHIEF COMPLAINTS/REASON FOR VISIT:  Metastatic non-small cell lung cancer  ASSESSMENT & PLAN:   Cancer Staging  Primary non-small cell carcinoma of upper lobe of left lung (HCC) Staging form: Lung, AJCC 8th Edition - Clinical: Stage IV (cT2b, cN1, cM1) - Signed by [name], MD on 08/06/2021   Primary non-small cell carcinoma of upper lobe of left lung (HCC) Stage IV lung adenocarcinoma with brain and bone metastasis.  S/p lung radiation to left lung.  January 2025, CT and PET scan results were reviewed. Stable disease. No progression or new disease.  Labs are reviewed and discussed with patient. I recommend to continue Keytruda   Encounter for antineoplastic immunotherapy Immunotherapy plan as listed above  Intraparenchymal hemorrhage of brain (HCC) Hold off Eliquis per neurology Dr. Barbaraann Cao 10/02/22 Repeat MRI brain-stable findings Repeat MRI brain in Jan 2025-result is negaive   Metastasis to bone [facility]) Previously on Zometa every 4- 6 weeks.-  previously on hold due to back decompression surgery [02/09/2023]. Continue calcium supplementation Proceed with Zometa   Seizure (HCC) On Keppra.  Recommend patient to follow up with neurologist.   Neuropathy Fingertip neuropathy intermittent Grade 1, feet neuropathy persistent, Grade 2.  On Gabapentin 300mg  TID    Follow up 3 weeks All questions were answered. The patient knows to call the clinic with any problems, questions or concerns.  [name], MD, [name] [facility] Hematology Oncology 05/05/2023      HISTORY OF PRESENTING ILLNESS:   Micheal Donovan is a  73 y.o.  male presents for follow up of Non-small cell lung cancer.  Oncology History Overview Note  Diagnosis: Stage IIB T2b N1 M0 adenocarcinoma of the LUL, poorly differentiated     Primary non-small cell carcinoma of upper lobe of left lung (HCC)  06/13/2021 Initial Diagnosis    Primary non-small cell carcinoma of upper lobe of left lung [facility]) -06/20/2021 - 06/27/2021, patient presented to [facility] due to progressive headache/dizziness/gait changes. CT head showed acute to subacute intraparenchymal hemorrhage involving the left cerebellum.  Surrounding low-density vasogenic edema.  Patient was transferred to [facility]. This was further evaluated by CT angiogram of the neck which showed bulky calcified plaques/stenosis of carotid artery, Followed by MRI brain. 06/12/2021, MRI of the brain showed no significant interval change in size of the left cerebellar intraparenchymal hematoma with unchanged regional mass effect and partial effacement of fourth ventricle but no upstream hydrocephalus. There is no discernible enhancement to suggest underlying mass lesion, though acute blood products could mask enhancement.   06/12/2021 a chest x-ray showed a 4.4 cm left middle lobe. 06/13/2021, CT chest with contrast showed a 4.3 x 3.6 x 3.3 cm lobular spiculated mass in the posterior left upper lobe with T3 to the lateral pleura and major fissure.  Metastatic left hilar lymphadenopathy.  Peripheral micronodularity posterior right costophrenic sulcus.  Aortic atherosclerosis. 06/14/2021, CT abdomen pelvis showed right lower lobe pulmonary artery embolus.  No evidence of right heart strain.  No acute intra-abdominal or pelvic pathology.  Aortic atherosclerosis.  06/13/2021, patient underwent bronchoscopy with EBUS by Dr. Katrinka Blazing.  Biopsy from the fine-needle aspiration of station 11 mL showed malignant cells, consistent with poorly differentiated non-small cell carcinoma, consistent with adenocarcinoma.  Malignant cells are TTF-1 positive and negative for p40.  Negative for neuroendocrine markers.  Blood test and tissue sample were tested for Gardant 360  -PD-L1 TPS 97%, no actionable mutation on  the blood testing. 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   07/17/2021  Cancer Staging   Staging form: Lung, AJCC 8th Edition - Clinical: Stage IV (cT2b, cN1, cM1) - Signed by [name], MD on 08/06/2021   08/06/2021 Imaging   I saw patient's PET scan after his visit with me on 08/06/21. PET scan was ordered by his previous oncologist group and did not come to my in basket.. Patient received cycle 1 carboplatin Taxol today.  He has started on radiation.  5/26/3 PET scan showed 3.8 cm hypermetabolic left upper lobe mass with hypermetabolic left hilar and infrahilar adenopathy.  There is approximately 8 scattered metastatic lesions in the skeleton. Mixed density photopenic lesion in the left cerebellum. characterized as hemorrhage on recent prior imaging workups.    08/16/2021 -  Chemotherapy   Patient is on Treatment Plan : LUNG Carboplatin (5) + Pemetrexed + Pembrolizumab (200) D1 q21d Induction x 4 cycles / Maintenance Pemetrexed + Pembrolizumab (200) D1 q21d      08/22/2021 Imaging   MRI brain w wo contrast  Decrease in size of left cerebellar hematoma with resolution of edema. No evidence of underlying lesion. Smaller, more recent hemorrhage in the left cerebellar vermis with minimal edema. There is minimal enhancement without definite evidence of underlying lesion.  New punctate focus of chronic blood products and enhancement in the left frontal lobe. Additional new foci of chronic blood products in the posterior right putamen, right parietal subcortical white matter, and posterior right cerebellum. Unclear at this time if these represent foci of bland hemorrhage or early metastases.   Increase in size of right parietal osseous metastasis with minor extraosseous extension.     Imaging   PET scan showed 1. Interval response to therapy as evidenced by a small residual left upper lobe nodule with decreased hypermetabolism, no residual hypermetabolic adenopathy and decreased hypermetabolism associatedwith osseous metastases. 2. 6 mm posterior left upper lobe nodule,  likely stable. Recommend attention on follow-up. 3. Aortic atherosclerosis (ICD10-I70.0). Coronary artery calcification.   12/17/2021 Imaging   MRI lumbar spine w wo contrast  1. No evidence of regional metastatic disease. 2. Late subacute superior endplate fracture at L2 and large superior endplate Schmorl's node at L5, unchanged since the PET scan of 10/29/2021. 3. L3-4: Shallow disc protrusion. Facet and ligamentous hypertrophy. Stenosis of both lateral recesses. Findings slightly worsened since 2022. 4. L4-5: Shallow disc protrusion. Facet and ligamentous hypertrophy. Small synovial cyst arising from the facet joint on the right. Stenosis of the lateral recesses right worse than left. Foraminal narrowing right worse than left. Findings have worsened since 2022.  5. L5-S1: Endplate osteophytes and shallow protrusion of the disc. Facet and ligamentous hypertrophy. Stenosis of the subarticular lateral recesses and neural foramina, right worse than left. Similar appearance to the study of 2022. 6. Continued evidence of enteritis of at least 1 loop of small bowel. Distended bladder present   12/18/2021 Imaging   Brian MRI w wo  1. Interval decrease in size of the left cerebellar and vermian hematomas. No definite evidence of underlying metastatic lesion. 2. There is are two possible contrast enhancing lesions in the posterior left frontal lobe and left occipital lobe, which do not have intrinsic T1 signal abnormality, but have a somewhat linear appearance and may be vascular in nature. Recommend attention on follow up. 3. Multifocal sites of susceptibility artifact are redemonstrated with interval development of a few new foci, as described above. All of these sites demonstrate intrinsic T1  hyperintense signal abnormality and susceptibility artifact and are compatible with sites of microhemorrhages. Recommend continued attention on follow up. 4. Interval decrease in size of right parietal calvarial  metastatic lesion. No new contrast enhancing lesions visualized. 5. Diffusely heterogeneous marrow signal throughout the cervical spine, which is nonspecific but can be seen in the setting of anemia, smoking, obesity, or a marrow replacement process.     12/18/2021 Imaging   MRI lumbar spine w wo contrast  1. No evidence of regional metastatic disease. 2. Late subacute superior endplate fracture at L2 and large superior endplate Schmorl's node at L5, unchanged since the PET scan of 10/29/2021 3. L3-4: Shallow disc protrusion. Facet and ligamentous hypertrophy.Stenosis of both lateral recesses. Findings slightly worsened since 2022. 4. L4-5: Shallow disc protrusion. Facet and ligamentous hypertrophy. Small synovial cyst arising from the facet joint on the right. Stenosis of the lateral recesses right worse than left. Foraminal narrowing right worse than left. Findings have worsened since 2022. 5. L5-S1: Endplate osteophytes and shallow protrusion of the disc. Facet and ligamentous hypertrophy. Stenosis of the subarticular lateral recesses and neural foramina, right worse than left. Similar appearance to the study of 2022. 6. Continued evidence of enteritis of at least 1 loop of small bowel. Distended bladder present.       01/28/2022 Imaging   CT chest abdomen pelvis w contrast 1. Treated left upper lobe mass appears grossly stable to the prior examination when measured in a similar fashion on the prior study. No definite signs of extra skeletal metastatic disease noted in the chest, abdomen or pelvis. 2. Widespread skeletal metastases redemonstrated, as above. 3. Hepatic steatosis. 4. Aortic atherosclerosis, in addition to left main and three-vessel coronary artery disease. Assessment for potential risk factor modification, dietary therapy or pharmacologic therapy may be warranted, if clinically indicated. 5. Additional incidental findings,    04/08/2022 Imaging   MRI lumbar spine w wo  contrast  1. Stable remote superior endplate fracture of L2 and large Schmorl to noted involving L5. No worrisome bone lesions to suggest metastatic disease. 2. Degenerative lumbar spondylosis with multilevel disc disease and facet disease which appears relatively stable as detailed above. 3. Enlarging right-sided synovial cyst at L4-5 with progressive mass effect on the right side of the thecal sac. This could potentially be symptomatic   04/28/2022 Imaging   CT chest abdomen pelvis wo contrast  1. Similar versus slightly decreased size of treated left upper lobe primary bronchogenic carcinoma. 2. Similar osseous metastasis. 3. No evidence of extraosseous metastatic disease. 4. New small right pleural effusion. 5. Coronary artery atherosclerosis. Aortic Atherosclerosis   07/03/2022 Imaging   PET scan showed 1. Interval development of a small focus of intense hypermetabolism within the left upper lobe scar. This is associated with a 5 mm perifissural nodule in the left lung which is hypermetabolic. Hypermetabolism at both of these locations is new since prior PET-CT and highly suspicious for recurrent disease. 2. No evidence for hypermetabolic soft tissue metastatic disease in the neck, abdomen, or pelvis. 3. Similar appearance of sclerotic and lytic bone lesions without hypermetabolism on PET imaging. 4. Focal hypermetabolism associated with a fracture of the right L4 transverse process. The fracture was visible on the previous CT of 04/28/2022. No associated soft tissue lesion to suggest that this is pathologic in nature. Tracer accumulation on today's study is in keeping with healing fracture. There is also some minimal uptake in a prominent spur associated with the L4 superior endplate, most likely degenerative. 5.  Aortic Atherosclerosis    08/01/2022 - 08/14/2022 Radiation Therapy   Radiation to left lung.    10/01/2022 Imaging   chest angiogram PE protocol  No pulmonary embolism  identified.   Stable nodular opacity in the left upper lobe. Please correlate for history of treated lung cancer. There is an adjacent small nodule along the course of the interlobar fissure which is slightly larger today compared to the study of February 2024. please correlate with findings of the recent PET-CT of 07/03/2022.      10/02/2022 Imaging   MRI brain with and without contrast 1. No acute intracranial abnormality. No evidence for intraparenchymal metastatic disease. 2. Chronic left cerebellar hemorrhage with multiple additional chronic micro hemorrhages elsewhere throughout the brain, stable. 3. Underlying mild chronic microvascular ischemic disease.    10/02/2022 Imaging   MRI lumbar spine showed 1. No MRI evidence for acute infection or other abnormality within the lumbar spine. 2. Few subcentimeter T1 hypointense lesions about the visualized posterior iliac wings. These correspond with small sclerotic foci seen on most recent CT from 04/08/2022, presumably reflecting patient's known osseous metastatic disease. These were not hypermetabolic on prior PET-CT from [id number]/8295. 3. No other evidence for metastatic disease within the lumbar spine itself. 4. Chronic L2 compression fracture, stable. Large Schmorl's node deformity with associated height loss at L5, also stable. 5. Multifactorial degenerative changes at L3-4 through L5-S1 with resultant mild to moderate bilateral subarticular stenosis, with mild to moderate bilateral L3 through L5 foraminal narrowing as above.   12/24/2022 Imaging   CT chest w contrast   1. Stable appearance of treated lung lesion within the left upper lobe with surrounding scarring and architectural distortion. 2. The previous FDG avid subpleural nodule abutting the oblique fissure of the left lung (adjacent to post treatment changes) is again noted measuring 7 mm. This is stable from 10/01/2022. On the PET-CT from 07/03/2022 this nodule was  measured at 5 mm. 3. New bandlike area of subpleural consolidation within the lateral and posterior left lower lobe is identified. This is favored to represent post treatment change. 4. Stable sclerotic lesions involving the T12 and L1 vertebra. 5. Coronary artery calcifications. 6.  Aortic Atherosclerosis    03/06/2023 Imaging   CT chest abdomen pelvis wo contrast showed  Increasing opacity along the lingula with the increasing nodular central component compared to the recent CT scan.   There is also some changing opacities in the left lower lobe. The larger more peripheral area in the left lower lobe is decreasing with some new areas of subtle ground-glass more caudal. Attention on follow-up.   No developing new other mass lesion or nodal enlargement.   Scattered sclerotic bone lesions has a similar distribution to previous.   Overall evaluation for solid organ pathology including solid organ metastases are limited without the advantage of IV contrast.     03/23/2023 Imaging   PET scan showed 1. Post radiation consolidation in the lingula with mild residual focal hypermetabolism, overall improved from 07/03/2022. No evidence of disease progression. 2. Quiescent osseous metastatic disease. 3. Aortic atherosclerosis (ICD10-I70.0). Coronary artery calcification.   # Patient has a history of melanoma on his neck, treated in 2009. # History of hemorrhagic infarct left cerebellum  10/01/2022 - 10/06/2022 patient was admitted to the hospital due to lower extremity weakness and intermittent aphasia. Patient reports acute on chronic left lower extremity weakness, weakness has been more prominent over the past 2 weeks.  He reports significant episode of difficulty with ambulation  2 days after his last Keytruda treatments.  He reports that he felt loss of control of muscle at that time.  Patient reports that the weakness is different than her chronic lower extremity  weakness.  Patient was  found to have orthostatic hypotension Flomax irbesartan were held.  CK wnl, Neurology was consulted and started the patient on Keppra for possible seizure.  EEG was negative.  01/31/23 He recently presented to ER due to altered mental status and shaking episodes.  CT head wo negative. UA positive for nitrate and leukocyte esterase. Code sepsis was actived in ER and he was recommended for treatment of UTI.  He declined admission and went home with Keflex 500mg  4 times daily for 10 days.  Lower extremity weakness localizes best to lumbosacral nerve roots, known spondylosis with multiple foci of neuro-foraminal stenosis.  02/09/2023 S/p decompression neurosurgery   INTERVAL HISTORY EERO DINI is a 73 y.o. male who has above history reviewed by me today presents for follow up visit for management of  Stage IV lung adenocarcinoma Accompanied by care give.   + back pain, and left lower extremity weakness due to pain, on fentanyl patch Q72 hours and percocet 04/17/2023 - 04/21/2023 hospitalized due to seizure, and UTI he was loaded with Keppra. Seen by neurology in office. UTI was treated with IV rocephin, discharged on Cipro.  Today he reports feeling at baseline. Did not sleep well at night.  He feels drowsy in afternoon.    Review of Systems  Constitutional:  Positive for fatigue. Negative for appetite change, chills, fever and unexpected weight change.  HENT:   Negative for hearing loss and voice change.   Eyes:  Negative for eye problems and icterus.  Respiratory:  Positive for shortness of breath. Negative for chest tightness and cough.   Cardiovascular:  Positive for leg swelling. Negative for chest pain.  Gastrointestinal:  Negative for abdominal distention, abdominal pain and blood in stool.  Endocrine: Negative for hot flashes.  Genitourinary:  Negative for difficulty urinating, dysuria and frequency.   Musculoskeletal:  Positive for back pain. Negative for arthralgias.        Chronic lower extremity weakness  Skin:  Negative for itching and rash.  Neurological:  Negative for extremity weakness, headaches, light-headedness and numbness.  Hematological:  Negative for adenopathy. Does not bruise/bleed easily.  Psychiatric/Behavioral:  Positive for sleep disturbance. Negative for confusion.     MEDICAL HISTORY:  Past Medical History:  Diagnosis Date   Allergy    Anxiety    BPH (benign prostatic hypertrophy)    Cancer (HCC)    Melanoma on Neck    2008   Carotid artery occlusion    CHF (congestive heart failure) (HCC)    Diabetes mellitus    type 2   ED (erectile dysfunction)    GERD (gastroesophageal reflux disease)    Hemorrhagic stroke (HCC) 06/2021   Hyperlipidemia    Hypertension    Lung cancer (HCC)    Neoplasm related pain    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)    Stroke [facility])     SURGICAL HISTORY: Past Surgical History:  Procedure Laterality Date   ANTERIOR CERVICAL DECOMP/DISCECTOMY FUSION N/A 02/09/2023   Procedure: C3-5 ANTERIOR CERVICAL DISCECTOMY AND FUSION;  Surgeon: Venetia Night, MD;  Location: [facility] ORS;  Service: Neurosurgery;  Laterality: N/A;   BRONCHIAL NEEDLE ASPIRATION BIOPSY  06/17/2021   Procedure: BRONCHIAL NEEDLE ASPIRATION BIOPSIES;  Surgeon: Lorin Glass, MD;  Location: [facility] ENDOSCOPY;  Service: Pulmonary;;   IR IMAGING GUIDED PORT INSERTION  08/05/2021   MELANOMA EXCISION  2008   Left side of neck   RADIOLOGY WITH ANESTHESIA N/A 04/05/2020   Procedure: MRI SPINE WITOUT CONTRAST;  Surgeon: Radiologist, Medication, MD;  Location: [facility] OR;  Service: Radiology;  Laterality: N/A;   RADIOLOGY WITH ANESTHESIA N/A 08/22/2021   Procedure: MRI BRAIN WITH AND WITHOUT CONTRAST  WITH ANESTHESIA;  Surgeon: Radiologist, Medication, MD;  Location: [facility] OR;  Service: Radiology;  Laterality: N/A;   RADIOLOGY WITH ANESTHESIA N/A 12/17/2021   Procedure: MRI BRAIN WITH AND WITHOUT CONTRAST WITH ANESTHESIA; MRI LUMBER WITH AND WITHOUT CONTRAST;   Surgeon: Radiologist, Medication, MD;  Location: [facility] OR;  Service: Radiology;  Laterality: N/A;   RADIOLOGY WITH ANESTHESIA N/A 04/08/2022   Procedure: MRI LUMBER SPINE WITH AND WITHOUT CONTRAST;  Surgeon: Radiologist, Medication, MD;  Location: [facility] OR;  Service: Radiology;  Laterality: N/A;   TONSILLECTOMY     VIDEO BRONCHOSCOPY WITH ENDOBRONCHIAL ULTRASOUND N/A 06/17/2021   Procedure: VIDEO BRONCHOSCOPY WITH ENDOBRONCHIAL ULTRASOUND;  Surgeon: Lorin Glass, MD;  Location: [facility] ENDOSCOPY;  Service: Pulmonary;  Laterality: N/A;    SOCIAL HISTORY: Social History   Socioeconomic History   Marital status: Married    Spouse name: Not on file   Number of children: Not on file   Years of education: Not on file   Highest education level: Not on file  Occupational History   Not on file  Tobacco Use   Smoking status: Former    Current packs/day: 0.00    Types: Cigarettes    Quit date: 07/21/1990    Years since quitting: 32.8   Smokeless tobacco: Never  Vaping Use   Vaping status: Never Used  Substance and Sexual Activity   Alcohol use: No   Drug use: No   Sexual activity: Not Currently  Other Topics Concern   Not on file  Social History Narrative   Not on file   Social Drivers of Health   Financial Resource Strain: Low Risk  (04/17/2022)   Overall Financial Resource Strain (CARDIA)    Difficulty of Paying Living Expenses: Not hard at all  Food Insecurity: No Food Insecurity (04/22/2023)   Hunger Vital Sign    Worried About Running Out of Food in the Last Year: Never true    Ran Out of Food in the Last Year: Never true  Transportation Needs: No Transportation Needs (04/22/2023)   PRAPARE - Administrator, Civil Service (Medical): No    Lack of Transportation (Non-Medical): No  Physical Activity: Inactive (03/27/2022)   Exercise Vital Sign    Days of Exercise per Week: 0 days    Minutes of Exercise per Session: 0 min  Stress: Stress Concern Present (03/27/2022)   [employer] of Occupational Health - Occupational Stress Questionnaire    Feeling of Stress : To some extent  Social Connections: Patient Unable To Answer (04/19/2023)   Social Connection and Isolation Panel [NHANES]    Frequency of Communication with Friends and Family: Patient unable to answer    Frequency of Social Gatherings with Friends and Family: Patient unable to answer    Attends Religious Services: Patient unable to answer    Active Member of Clubs or Organizations: Patient unable to answer    Attends Banker Meetings: Patient unable to answer    Marital Status: Patient unable to answer  Intimate Partner Violence: Not At Risk (04/22/2023)   Humiliation, Afraid, Rape, and  Kick questionnaire    Fear of Current or Ex-Partner: No    Emotionally Abused: No    Physically Abused: No    Sexually Abused: No    FAMILY HISTORY: Family History  Problem Relation Age of Onset   COPD Mother    Heart disease Father    Heart disease Brother        MI at age 40   Stroke Neg Hx     ALLERGIES:  is allergic to niaspan [niacin].  MEDICATIONS:  Current Outpatient Medications  Medication Sig Dispense Refill   ALPRAZolam (XANAX) 0.5 MG tablet Take 1 tablet (0.5 mg total) by mouth at bedtime as needed for anxiety. 15 tablet 0   celecoxib (CELEBREX) 200 MG capsule Take 1 capsule (200 mg total) by mouth 2 (two) times daily. 60 capsule 0   citalopram (CELEXA) 10 MG tablet Take 1 tablet (10 mg total) by mouth daily. 30 tablet 3   docusate sodium (COLACE) 100 MG capsule Take 100 mg by mouth 2 (two) times daily as needed for mild constipation.     fentaNYL (DURAGESIC) 75 MCG/HR Place 1 patch onto the skin every 3 (three) days. 10 patch 0   gabapentin (NEURONTIN) 300 MG capsule Take 2 capsules (600 mg total) by mouth 2 (two) times daily. 120 capsule 2   insulin glargine (LANTUS) 100 UNIT/ML injection Inject 0-30 Units into the skin daily as needed (High Blood Sugar over 150).     Insulin  Pen Needle (B-D UF III MINI PEN NEEDLES) 31G X 5 MM MISC Use to inject insulin daily. 100 each 3   levETIRAcetam (KEPPRA) 500 MG tablet Take 1 tablet (500 mg total) by mouth 2 (two) times daily. 180 tablet 3   megestrol (MEGACE) 40 MG tablet Take 1 tablet (40 mg total) by mouth daily. 30 tablet 3   methocarbamol (ROBAXIN) 500 MG tablet Take 1 tablet (500 mg total) by mouth every 6 (six) hours as needed for muscle spasms. 120 tablet 0   midodrine (PROAMATINE) 10 MG tablet Take 1 tablet (10 mg total) by mouth 3 (three) times daily as needed (Systolic BP less than 110 mmHg). 90 tablet 0   NOVOLOG FLEXPEN 100 UNIT/ML FlexPen Inject 0-10 Units into the skin daily as needed for high blood sugar (BG > 200).     Nystatin (GERHARDT'S BUTT CREAM) CREA Apply 14 Applications topically 2 (two) times daily. 1 each 0   omeprazole (PRILOSEC) 20 MG capsule Take 20 mg by mouth daily.     oxyCODONE-acetaminophen (PERCOCET) 7.5-325 MG tablet Take 1 tablet by mouth every 4 (four) hours as needed for severe pain (pain score 7-10). Do not take with cough medication or other pain medication 60 tablet 0   senna (SENOKOT) 8.6 MG TABS tablet Take 1 tablet (8.6 mg total) by mouth 2 (two) times daily as needed for mild constipation. 30 tablet 0   tamsulosin (FLOMAX) 0.4 MG CAPS capsule Take 1 capsule (0.4 mg total) by mouth daily. 30 capsule 2   No current facility-administered medications for this visit.   Facility-Administered Medications Ordered in Other Visits  Medication Dose Route Frequency Provider Last Rate Last Admin   heparin lock flush 100 UNIT/ML injection              PHYSICAL EXAMINATION:  Vitals:   05/05/23 1313  BP: 96/69  Pulse: 91  Resp: 18  Temp: (!) 96.3 F (35.7 C)  SpO2: 96%   Filed Weights   05/05/23 1313  Weight: 127 lb 1.6 oz (57.7 kg)    Physical Exam Constitutional:      General: He is not in acute distress.    Appearance: He is ill-appearing.  HENT:     Head: Normocephalic and  atraumatic.  Eyes:     General: No scleral icterus. Cardiovascular:     Rate and Rhythm: Normal rate and regular rhythm.     Heart sounds: Normal heart sounds.  Pulmonary:     Effort: Pulmonary effort is normal. No respiratory distress.     Breath sounds: No wheezing.     Comments: Decreased breath sound bilaterally. Abdominal:     General: Bowel sounds are normal. There is no distension.     Palpations: Abdomen is soft.  Musculoskeletal:        General: No deformity. Normal range of motion.     Cervical back: Normal range of motion and neck supple.     Comments: Bilateral ankle trace edema.  Skin:    General: Skin is warm and dry.     Findings: No rash.  Neurological:     Mental Status: He is alert. Mental status is at baseline.     Cranial Nerves: No cranial nerve deficit.     Comments: Chronic left-sided weakness  Psychiatric:        Mood and Affect: Mood normal.     LABORATORY DATA:  I have reviewed the data as listed    Latest Ref Rng & Units 05/05/2023   12:56 PM 04/21/2023    5:57 AM 04/20/2023   10:17 AM  CBC  WBC 4.0 - 10.5 K/uL 8.3  4.7  5.4   Hemoglobin 13.0 - 17.0 g/dL 82.9  56.2  13.0   Hematocrit 39.0 - 52.0 % 38.6  33.2  36.2   Platelets 150 - 400 K/uL 239  227  251       Latest Ref Rng & Units 05/05/2023   12:56 PM 04/21/2023    5:57 AM 04/20/2023   10:17 AM  CMP  Glucose 70 - 99 mg/dL 865  784  696   BUN 8 - 23 mg/dL 23  19  19    Creatinine 0.61 - 1.24 mg/dL 2.95  2.84  1.32   Sodium 135 - 145 mmol/L 134  135  133   Potassium 3.5 - 5.1 mmol/L 4.3  3.7  3.8   Chloride 98 - 111 mmol/L 99  105  102   CO2 22 - 32 mmol/L 26  22  23    Calcium 8.9 - 10.3 mg/dL 8.8  8.7  8.5   Total Protein 6.5 - 8.1 g/dL 6.6     Total Bilirubin 0.0 - 1.2 mg/dL 0.5     Alkaline Phos 38 - 126 U/L 63     AST 15 - 41 U/L 17     ALT 0 - 44 U/L 9        Iron/TIBC/Ferritin/ %Sat    Component Value Date/Time   IRON 60 02/03/2022 0958   TIBC 391 02/03/2022 0958   FERRITIN  225 04/19/2023 0648   IRONPCTSAT 15 (L) 02/03/2022 0958      RADIOGRAPHIC STUDIES: I have personally reviewed the radiological images as listed and agreed with the findings in the report. MR BRAIN W WO CONTRAST Result Date: 04/18/2023 CLINICAL DATA:  Seizure, new-onset, no history of trauma repeat MRI brain wwo, 1st was nondiagnostic 2/2 movement EXAM: MRI HEAD WITHOUT AND WITH CONTRAST TECHNIQUE: Multiplanar, multiecho pulse sequences of the brain  and surrounding structures were obtained without and with intravenous contrast. CONTRAST:  5mL GADAVIST GADOBUTROL 1 MMOL/ML IV SOLN COMPARISON:  MRI February 14, 25. 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 No evidence of acute infarct, acute hemorrhage, mass lesion, midline shift or hydrocephalus. No pathologic enhancement. No hydrocephalus. Vascular: Abnormal left intradural vertebral artery flow void, compatible with stenosis seen on prior CTA. Major arterial flow voids are maintained at the skull base. Skull and upper cervical spine: Normal marrow signal. Sinuses/Orbits: Mostly clear sinuses.  No acute orbital findings. Other: No mastoid effusions. IMPRESSION: 1. No evidence of acute intracranial abnormality or metastatic disease. 2. 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 Electronically Signed   By: Feliberto Harts M.D.   On: 04/18/2023 21:53   MR Brain W and Wo Contrast Result Date: 04/17/2023 CLINICAL DATA:  Seizure disorder, clinical change New right sided invol jerking, hx of non-small cell lung ca EXAM: MRI HEAD WITHOUT AND WITH CONTRAST TECHNIQUE: Multiplanar, multiecho pulse sequences of the brain and surrounding structures were obtained without and with intravenous contrast. CONTRAST:  1mL GADAVIST GADOBUTROL 1 MMOL/ML IV SOLN COMPARISON:  MRI 02/24/2023. FINDINGS: Severely motion limited and incomplete study study. Many sequences were not performed, including post-contrast imaging. The following sequences  were performed: DWI/ADC and motion limited sagittal T1 and axial T2/FLAIR. Within this limitation: Brain: No acute infarction, acute hemorrhage, hydrocephalus, extra-axial collection or mass lesion. Remote left cerebellar hemorrhagic infarct. Cerebral atrophy. Vascular: Not well assessed due to motion. Skull and upper cervical spine: Normal marrow signal. Sinuses/Orbits: Mostly clear sinuses. Other: No mastoid effusions. IMPRESSION: Severely motion limited study and incomplete study without obvious acute abnormality. Of note, postcontrast imaging was not performed and metastatic disease cannot be excluded. Electronically Signed   By: Feliberto Harts M.D.   On: 04/17/2023 20:38

## 2023-05-05 NOTE — Assessment & Plan Note (Signed)
 Previously on Zometa every 4- 6 weeks.-  previously on hold due to back decompression surgery [02/09/2023]. Continue calcium supplementation Proceed with Zometa

## 2023-05-05 NOTE — Assessment & Plan Note (Signed)
 Hold off Eliquis per neurology Dr. Barbaraann Cao 10/02/22 Repeat MRI brain-stable findings Repeat MRI brain in Jan 2025-result is negaive

## 2023-05-07 ENCOUNTER — Encounter: Payer: Self-pay | Admitting: Urology

## 2023-05-07 ENCOUNTER — Other Ambulatory Visit: Payer: PPO | Admitting: Urology

## 2023-05-08 ENCOUNTER — Other Ambulatory Visit: Payer: Self-pay | Admitting: *Deleted

## 2023-05-08 NOTE — Patient Outreach (Signed)
 Care Management  Transitions of Care Program Transitions of Care Post-discharge week 3  05/08/2023 Name: Micheal Donovan MRN: [id number] DOB: 1951/01/07  Subjective: Micheal Donovan is a 73 y.o. year old male who is a primary care patient of Donita Brooks, MD. The Care Management team was unable to reach the patient by phone to assess and address transitions of care needs.   Plan: Additional outreach attempts will be made to reach the patient enrolled in the [facility] Program (Post Inpatient/ED Visit).  [name] Shows [facility], BSN RN Care Manager/ Transition of Care [place]/ [facility] [phone]

## 2023-05-11 ENCOUNTER — Telehealth: Payer: Self-pay | Admitting: *Deleted

## 2023-05-11 NOTE — Patient Outreach (Signed)
 Care Management  Transitions of Care Program Transitions of Care Post-discharge week 3- 2nd attempt  05/11/2023 Name: Micheal Donovan MRN: [id number] DOB: 1950/04/07  Subjective: Micheal Donovan is a 73 y.o. year old male who is a primary care patient of Donita Brooks, MD. The Care Management team was unable to reach the patient by phone to assess and address transitions of care needs.   Plan: Additional outreach attempts will be made to reach the patient enrolled in the [facility] Program (Post Inpatient/ED Visit).  Micheal Donovan [facility], BSN RN Care Manager/ Transition of Care [place]/ [facility] [phone]

## 2023-05-12 ENCOUNTER — Encounter: Payer: Self-pay | Admitting: Student in an Organized Health Care Education/Training Program

## 2023-05-12 ENCOUNTER — Encounter: Payer: Self-pay | Admitting: *Deleted

## 2023-05-12 ENCOUNTER — Ambulatory Visit: Payer: PPO | Admitting: Oncology

## 2023-05-12 ENCOUNTER — Ambulatory Visit
Admission: RE | Admit: 2023-05-12 | Discharge: 2023-05-12 | Disposition: A | Discharge status: Home / Self Care | Source: Ambulatory Visit | Attending: Student in an Organized Health Care Education/Training Program | Admitting: Student in an Organized Health Care Education/Training Program

## 2023-05-12 ENCOUNTER — Ambulatory Visit: Payer: PPO

## 2023-05-12 ENCOUNTER — Other Ambulatory Visit: Payer: PPO

## 2023-05-12 ENCOUNTER — Ambulatory Visit
Payer: PPO | Attending: Student in an Organized Health Care Education/Training Program | Admitting: Student in an Organized Health Care Education/Training Program

## 2023-05-12 ENCOUNTER — Telehealth: Payer: Self-pay | Admitting: *Deleted

## 2023-05-12 VITALS — BP 101/67 | HR 86 | Temp 97.9°F | Resp 16 | Ht 67.0 in | Wt 130.0 lb

## 2023-05-12 DIAGNOSIS — M47816 Spondylosis without myelopathy or radiculopathy, lumbar region: Secondary | ICD-10-CM | POA: Diagnosis not present

## 2023-05-12 DIAGNOSIS — G894 Chronic pain syndrome: Secondary | ICD-10-CM | POA: Insufficient documentation

## 2023-05-12 NOTE — Progress Notes (Signed)
 Patient: Micheal Donovan  Service Category: E/M  Provider: Flor Jolly, MD  DOB: [birth year]  DOS: 05/12/2023  Referring Provider: Venetia Night, MD  MRN: [id number]  Setting: Ambulatory outpatient  PCP: Donita Brooks, MD  Type: New Patient  Specialty: Interventional Pain Management    Location: Office  Delivery: Face-to-face     Primary Reason(s) for Visit: Encounter for initial evaluation of one or more chronic problems (new to examiner) potentially causing chronic pain, and posing a threat to normal musculoskeletal function. (Level of risk: High) CC: No chief complaint on file.  HPI  Micheal Donovan is a 73 y.o. year old, male patient, who comes for the first time to our practice referred by Venetia Night, MD for our initial evaluation of his chronic pain. He has Type 2 diabetes mellitus without complications (HCC); HYPERCHOLESTEROLEMIA; Benign hypertension; CAROTID BRUIT, LEFT; CHEST PAIN; Carotid stenosis, bilateral; Intraparenchymal hemorrhage of brain (HCC); Type 2 diabetes mellitus with hyperglycemia (HCC); Pseudohyponatremia; Dehydration; Leukocytosis; Hypercalcemia; Hyperlipidemia; CAP (community acquired pneumonia); Cerebellar bleed (HCC); Headache; Pressure injury of skin; Lumbar pain; Type 2 diabetes mellitus with hyperlipidemia (HCC); Lumbar radiculopathy; Primary non-small cell carcinoma of upper lobe of left lung (HCC); Hemoptysis; Encounter for antineoplastic chemotherapy; Goals of care, counseling/discussion; Pulmonary embolus (HCC); Metastasis to bone [facility]); Back pain; Folate deficiency anemia; B12 deficiency anemia; Frequent urination; Neutropenic fever (HCC); Essential hypertension; Dyslipidemia; Lower abdominal pain; Anemia of chronic disease; Enteritis; Rhinovirus infection; Hypophosphatemia; Chemotherapy induced neutropenia (HCC); Thrombocytosis; Swelling of lower extremity; Elevated serum creatinine; Orthostatic hypotension; Encounter for antineoplastic immunotherapy; Weakness of  left lower extremity; Weight loss; Hypokalemia; Neuropathy; Weakness; (HFpEF) heart failure with preserved ejection fraction (HCC); Sinusitis; Seizure (HCC); Myocardial ischemia; Malnutrition of moderate degree; Cervical myelopathy (HCC); Cervical spinal stenosis; UTI (urinary tract infection); Chronic diastolic CHF (congestive heart failure) (HCC); Depression with anxiety; BPH (benign prostatic hyperplasia); Seizure disorder (HCC); Lumbar spondylosis; and Chronic pain syndrome on their problem list. Today he comes in for evaluation of his low back pain  Pain Assessment: Location: Lower, Right, Left Back Radiating: denies Onset: More than a month ago Duration: Chronic pain Quality: Aching Severity: 3 /10 (subjective, self-reported pain score)  Effect on ADL: limits dialy activities Timing: Constant Modifying factors: percocet, robaxin, gabapentin, resting, reclining BP: 101/67  HR: 86  Onset and Duration: Date of onset: not answered Cause of pain: Unknown Severity: Getting worse, NAS-11 at its worse: 10/10, NAS-11 at its best: 3/10, NAS-11 now: 3/10, and NAS-11 on the average: 3/10 Timing: During activity or exercise, After activity or exercise, and After a period of immobility Aggravating Factors: Bending, Kneeling, Lifiting, Motion, Prolonged sitting, Prolonged standing, Twisting, and Walking Alleviating Factors: Lying down, Medications, and Sleeping Associated Problems: Weakness Quality of Pain: Aching, Agonizing, and Annoying Previous Examinations or Tests: The patient denies . Previous Treatments: Narcotic medications  Micheal Donovan is being evaluated for possible interventional pain management therapies for the treatment of his chronic pain.  Discussed the use of AI scribe software for clinical note transcription with the patient, who gave verbal consent to proceed.  History of Present Illness   The patient presents with chronic lower back pain for evaluation of a spinal cord  stimulator. He was referred for neurosurgery for consideration of a spinal cord stimulator.  He has been experiencing chronic lower back pain for the past three to five years. The pain is localized to the lower back without radiation and has persisted despite previous treatments. There is no known incident or trauma that initiated the pain.  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  He has undergone neck surgery in the past.   He has a history of stage four lung cancer, which is currently stable. He has experienced significant weight loss, approximately fifty pounds, since his diagnosis. No use of blood thinners. He chronic pain medications are managed by cancer team- Fentanyl patch. He is also on Xanax for anxiety. These are to be continued with the cancer team.     Foley catheter in place  PDMP reviewed. Meds   Current Outpatient Medications:    ALPRAZolam (XANAX) 0.5 MG tablet, Take 1 tablet (0.5 mg total) by mouth at bedtime as needed for anxiety., Disp: 15 tablet, Rfl: 0   celecoxib (CELEBREX) 200 MG capsule, Take 1 capsule (200 mg total) by mouth 2 (two) times daily., Disp: 60 capsule, Rfl: 0   citalopram (CELEXA) 10 MG tablet, Take 1 tablet (10 mg total) by mouth daily., Disp: 30 tablet, Rfl: 3   docusate sodium (COLACE) 100 MG capsule, Take 100 mg by mouth 2 (two) times daily as needed for mild constipation., Disp: , Rfl:    fentaNYL (DURAGESIC) 75 MCG/HR, Place 1 patch onto the skin every 3 (three) days., Disp: 10 patch, Rfl: 0   gabapentin (NEURONTIN) 300 MG capsule, Take 2 capsules (600 mg total) by mouth 2 (two) times daily., Disp: 120 capsule, Rfl: 2   insulin glargine (LANTUS) 100 UNIT/ML injection, Inject 0-30 Units into the skin daily as needed (High Blood Sugar over 150)., Disp: , Rfl:    Insulin Pen Needle (B-D UF III MINI PEN NEEDLES) 31G X 5 MM MISC, Use to inject insulin daily.,  Disp: 100 each, Rfl: 3   levETIRAcetam (KEPPRA) 500 MG tablet, Take 1 tablet (500 mg total) by mouth 2 (two) times daily., Disp: 180 tablet, Rfl: 3   megestrol (MEGACE) 40 MG tablet, Take 1 tablet (40 mg total) by mouth daily., Disp: 30 tablet, Rfl: 3   methocarbamol (ROBAXIN) 500 MG tablet, Take 1 tablet (500 mg total) by mouth every 6 (six) hours as needed for muscle spasms., Disp: 120 tablet, Rfl: 0   midodrine (PROAMATINE) 10 MG tablet, Take 1 tablet (10 mg total) by mouth 3 (three) times daily as needed (Systolic BP less than 110 mmHg)., Disp: 90 tablet, Rfl: 0   NOVOLOG FLEXPEN 100 UNIT/ML FlexPen, Inject 0-10 Units into the skin daily as needed for high blood sugar (BG > 200)., Disp: , Rfl:    Nystatin (GERHARDT'S BUTT CREAM) CREA, Apply 14 Applications topically 2 (two) times daily., Disp: 1 each, Rfl: 0   omeprazole (PRILOSEC) 20 MG capsule, Take 20 mg by mouth daily., Disp: , Rfl:    oxyCODONE-acetaminophen (PERCOCET) 7.5-325 MG tablet, Take 1 tablet by mouth every 4 (four) hours as needed for severe pain (pain score 7-10). Do not take with cough medication or other pain medication, Disp: 60 tablet, Rfl: 0   senna (SENOKOT) 8.6 MG TABS tablet, Take 1 tablet (8.6 mg total) by mouth 2 (two) times daily as needed for mild constipation., Disp: 30 tablet, Rfl: 0   tamsulosin (FLOMAX) 0.4 MG CAPS capsule, Take 1 capsule (0.4 mg total) by mouth daily., Disp: 30 capsule, Rfl: 2 No current facility-administered medications for this visit.  Facility-Administered Medications Ordered in Other Visits:    heparin lock flush 100 UNIT/ML injection, , , ,   Imaging Review  Cervical Imaging: Cervical MR wo contrast:  Results for orders placed during the hospital encounter of 01/09/23  MR CERVICAL SPINE WO CONTRAST  Narrative CLINICAL DATA:  Initial evaluation for chronic myelopathy. Neck pain.  EXAM: MRI CERVICAL SPINE WITHOUT CONTRAST  TECHNIQUE: Multiplanar, multisequence MR imaging of the  cervical spine was performed. No intravenous contrast was administered.  COMPARISON:  None Available.  FINDINGS: Alignment: Straightening of the normal cervical lordosis. No significant listhesis.  Vertebrae: Vertebral body height maintained without acute or chronic fracture. Prior fusion at C5-6. Bone marrow signal intensity within normal limits. No worrisome osseous lesions. No significant abnormal marrow edema.  Cord: Patchy signal abnormality involving the cervical cord at the level of C5-6, consistent with chronic myelomalacia. Focal cord atrophy at this level.  Posterior Fossa, vertebral arteries, paraspinal tissues: Age-related cerebral atrophy. Craniocervical junction within normal limits. Paraspinous soft tissues normal. Normal flow voids seen within the vertebral arteries bilaterally. Sphenoid sinus retention cyst noted.  Disc levels:  C2-C3: Central disc osteophyte complex indents and partially faces the ventral thecal sac. Moderate right with mild left facet arthrosis. Mild spinal stenosis. Mild left with severe right C3 foraminal stenosis.  C3-C4: Degenerative vertebral disc space narrowing. Broad-based posterior disc osteophyte complex flattens and effaces the ventral thecal sac, asymmetric to the left. Superimposed facet and ligament flavum hypertrophy. Secondary cord flattening without visible cord signal changes. Severe spinal stenosis. Severe left with moderate right C4 foraminal narrowing.  C4-C5: Degenerate intervertebral disc space narrowing with diffuse disc osteophyte complex. Broad posterior component flattens and effaces the ventral thecal sac. Superimposed facet and ligament flavum hypertrophy. Secondary cord flattening without cord signal changes. Severe spinal stenosis with severe bilateral C5 foraminal narrowing.  C5-C6: Prior fusion. Endplate osseous ridging indents the ventral thecal sac with residual mild spinal stenosis. Foramina  appear patent.  C6-C7: Degenerative vertebral disc space narrowing. Central to left paracentral disc osteophyte complex indents the ventral thecal sac (series 108, image 23). Minimal cord flattening without cord signal changes. Mild spinal stenosis. Bilateral uncovertebral spurring with resultant moderate bilateral C7 foraminal stenosis.  C7-T1: Degenerative disc space narrowing with diffuse disc bulge. Endplate and uncovertebral spurring. Mild facet hypertrophy. No spinal stenosis. Mild to moderate left C8 foraminal narrowing. Right neural foramen remains patent.  T1-2: Seen only on sagittal projection. Diffuse disc bulge with endplate spurring. No significant spinal stenosis. Mild to moderate bilateral foraminal narrowing.  T2-3: Seen only on sagittal projection. Mild disc bulge with endplate spurring. No spinal stenosis. Mild to moderate bilateral foraminal narrowing.  IMPRESSION: 1. Prior fusion at C5-6 with residual mild spinal stenosis. Patchy signal abnormality involving the cervical cord at this level, consistent with chronic myelomalacia. 2. Degenerative spondylosis and facet hypertrophy at C3-4 and C4-5 with resultant severe spinal stenosis, with moderate to severe bilateral C4 and C5 foraminal narrowing as above. 3. Degenerative disc osteophyte at C6-7 with resultant mild spinal stenosis, with moderate bilateral C7 foraminal narrowing.   Electronically Signed By: Rise Mu M.D. On: 01/09/2023 20:56    Narrative CLINICAL DATA:  Status post cervical fusion.  Cervical myelopathy.  EXAM: CERVICAL SPINE - 2-3 VIEW  COMPARISON:  Operative imaging, 02/09/2023. MR cervical spine, 01/09/2023.  FINDINGS: Alvino Chapel fracture or bone lesion.  No spondylolisthesis.  Anterior cervical spine fusion, C3 through C5. Well-positioned anterior fusion plate with well-seated fixation screws. Intervertebral disc cages are well centered at C3-C4 and C4-C5. No evidence of  hardware loosening.  Mature bony fusion at C5-C6.  Mild to moderate loss of disc height with endplate spurring at C6-C7.  Soft tissues are unremarkable.  IMPRESSION: 1. Anterior cervical disc fusion C3 through C5. No evidence of a hardware complication. 2. No fracture or acute finding.   Electronically Signed By: Amie Portland M.D. On: 03/27/2023 10:09   MR LUMBAR SPINE WO CONTRAST  Narrative CLINICAL DATA:  Low back and bilateral leg pain.  EXAM: MRI LUMBAR SPINE WITHOUT CONTRAST  TECHNIQUE: Multiplanar, multisequence MR imaging of the lumbar spine was performed. No intravenous contrast was administered.  COMPARISON:  None.  FINDINGS: Segmentation: There are five lumbar type vertebral bodies. The last full intervertebral disc space is labeled L5-S1.  Alignment:  Normal  Vertebrae:  Normal marrow signal.  No bone lesions or fractures.  Conus medullaris and cauda equina: Conus extends to the L1 level. Conus and cauda equina appear normal.  Paraspinal and other soft tissues: No significant paraspinal or retroperitoneal findings.  Disc levels:  T12-L1: No significant findings.  L1-2: No significant findings.  L2-3: Mild annular bulge but no spinal, lateral recess or foraminal stenosis.  L3-4: Mild disc desiccation and degeneration with a diffuse bulging annulus. There is flattening of the ventral thecal sac and mild bilateral lateral recess encroachment. No foraminal stenosis. Mild facet disease, left greater than right.  L4-5: Mild diffuse annular bulge with slight flattening of the ventral thecal sac and mild bilateral lateral recess encroachment. No significant spinal or foraminal stenosis. Mild facet disease bilaterally.  L5-S1: Bulging degenerated annulus and shallow central disc protrusion with mild impression on the ventral thecal sac. Mild foraminal narrowing bilaterally. No spinal stenosis. Mild bilateral facet disease.  IMPRESSION: 1.  Mild bilateral lateral recess encroachment at L3-4 and L4-5. 2. Bulging degenerated annulus and shallow central disc protrusion at L5-S1 with mild impression on the ventral thecal sac and mild bilateral foraminal narrowing.   Electronically Signed By: Rudie Meyer M.D. On: 04/05/2020 11:40   MR Lumbar Spine W Wo Contrast  Narrative CLINICAL DATA:  Initial evaluation for lumbar radiculopathy. Infection suspected. History of lung cancer.  EXAM: MRI LUMBAR SPINE WITHOUT AND WITH CONTRAST  TECHNIQUE: Multiplanar and multiecho pulse sequences of the lumbar spine were obtained without and with intravenous contrast.  CONTRAST:  6mL GADAVIST GADOBUTROL 1 MMOL/ML IV SOLN  COMPARISON:  Prior study from 04/08/2022.  FINDINGS: Segmentation: Standard. Lowest well-formed disc space labeled the L5-S1 level.  Alignment: 4 mm retrolisthesis of L5 on S1. Straightening with mild reversal of the normal lumbar lordosis.  Vertebrae: Chronic compression deformity involving the superior endplate of L2, stable. Large endplate Schmorl's node deformity with associated height loss at the superior endplate of L5, also stable. Smaller endplate Schmorl's node deformity with mild height loss at the superior endplate of L3 noted as well, also stable. Vertebral body height otherwise maintained with no acute or interval fracture. Underlying bone marrow signal intensity within normal limits. A few small T1 hypointense lesions seen about the visualized posterior iliac wings bilaterally, largest of which seen on the left and measures 8 mm (series 12, image 33). These correspond with previously seen small sclerotic lesions, presumably reflecting patient's known osseous metastases. These were not hypermetabolic on prior PET-CT from 62/95/2841. Mild degenerative reactive endplate changes noted about the L3-4 and L5-S1 interspaces. No evidence for osteomyelitis discitis or septic arthritis.  Conus  medullaris and cauda equina: Conus extends to the L1-2 level. Conus and cauda equina appear normal.  Paraspinal and other soft tissues: Paraspinous soft tissues demonstrate no acute finding. Chronic fatty atrophy noted about the posterior paraspinous and psoas musculature.  Disc levels:  L1-2: Disc desiccation with mild disc bulge. Mild facet spurring. No spinal stenosis. Foramina remain patent.  L2-3: Disc desiccation with mild disc bulge. No spinal stenosis. Foramina remain patent.  L3-4: Degenerative intervertebral disc space narrowing with diffuse disc bulge. Associated reactive endplate spurring. Mild bilateral facet hypertrophy. Resultant mild left greater than right lateral recess stenosis. Central canal remains patent. Mild to moderate left with mild right L3 foraminal stenosis.  L4-5: Mild disc bulge with reactive endplate spurring. Mild to moderate facet hypertrophy. Resultant mild to moderate bilateral subarticular stenosis. Central canal remains patent. Mild to moderate left with moderate right L4 foraminal stenosis.  L5-S1: Retrolisthesis with degenerative intervertebral disc space narrowing. Central disc protrusion with slight inferior angulation and annular fissure indents the ventral thecal sac (series 12, image 33). Mild bilateral facet hypertrophy. Resultant mild bilateral subarticular stenosis. Central canal remains patent. Moderate bilateral L5 foraminal narrowing.  IMPRESSION: 1. No MRI evidence for acute infection or other abnormality within the lumbar spine. 2. Few subcentimeter T1 hypointense lesions about the visualized posterior iliac wings. These correspond with small sclerotic foci seen on most recent CT from 04/08/2022, presumably reflecting patient's known osseous metastatic disease. These were not hypermetabolic on prior PET-CT from 03/05/7251. 3. No other evidence for metastatic disease within the lumbar spine itself. 4. Chronic L2  compression fracture, stable. Large Schmorl's node deformity with associated height loss at L5, also stable. 5. Multifactorial degenerative changes at L3-4 through L5-S1 with resultant mild to moderate bilateral subarticular stenosis, with mild to moderate bilateral L3 through L5 foraminal narrowing as above.   Electronically Signed By: Rise Mu M.D. On: 10/02/2022 05:22    CT Lumbar Spine Wo Contrast  Narrative CLINICAL DATA:  Metastatic disease to lower back. Back pain. Evaluate compression fracture for possible kyphoplasty/RFA. History of lung cancer and melanoma.  EXAM: CT LUMBAR SPINE WITHOUT CONTRAST  TECHNIQUE: Multidetector CT imaging of the lumbar spine was performed without intravenous contrast administration. Multiplanar CT image reconstructions were also generated.  RADIATION DOSE REDUCTION: This exam was performed according to the departmental dose-optimization program which includes automated exposure control, adjustment of the mA and/or kV according to patient size and/or use of iterative reconstruction technique.  COMPARISON:  MRI lumbar spine 12/17/2021 and 04/05/2020. Abdominopelvic CT 11/18/2021 and 01/27/2022. PET-CT 10/29/2021 and 07/24/2021.  FINDINGS: Segmentation: There are 5 lumbar type vertebral bodies.  Alignment: Chronic straightening without focal angulation or significant anterolisthesis. There is a stable chronic retrolisthesis at L5-S1.  Vertebrae: Chronic pathologic fractures involving the superior endplates of L2 and L5 are unchanged from the CT of 11/18/2021 and lumbar MRI 12/17/2021. No progressive loss of height at either level. No new fracture or progressive metastatic disease demonstrated. Grossly stable node metastatic disease in the visualized upper pelvis without pathologic fracture.  Paraspinal and other soft tissues: No acute paraspinal findings. There is aortic and branch vessel atherosclerosis.  Diffuse subcutaneous edema noted in the back without focal fluid collection.  Disc levels:  L1-2: Disc bulging with vacuum disc phenomenon. No significant spinal stenosis.  L2-3: Mild disc bulging with endplate osteophytes. No significant spinal stenosis.  L3-4: Loss of disc height with disc bulging and endplate osteophytes asymmetric to the left. Mild facet and ligamentous hypertrophy. Stable mild asymmetric left lateral recess and left foraminal narrowing.  L4-5: Chronic pathologic fracture/Schmorl's node in the superior endplate of L5. Disc desiccation with annular bulging, facet and ligamentous hypertrophy. Resulting mild multifactorial spinal stenosis with mild lateral recess and foraminal narrowing bilaterally,  unchanged from most recent MRI.  L5-S1: Chronic degenerative disc disease with loss of disc height, disc bulging, endplate osteophytes and vacuum phenomenon. Chronic mild to moderate foraminal narrowing bilaterally appears unchanged.  IMPRESSION: 1. Stable appearance of the lumbar spine compared with most recent MRI of 12/17/2021 and CT of 11/18/2021. 2. Chronic pathologic fractures in the superior endplates of L2 and L5 are unchanged. No progressive loss of height. No new fracture or progressive metastatic disease demonstrated. 3. Multilevel spondylosis as described, similar to most recent MRI. 4.  Aortic Atherosclerosis (ICD10-I70.0).   Electronically Signed By: Carey Bullocks M.D. On: 03/20/2022 16:32    DG Lumbar Spine Complete  Narrative CLINICAL DATA:  Back pain, history of non-small cell carcinoma LEFT upper lobe, no injury, pain across lower back off and on for 10 years  EXAM: LUMBAR SPINE - COMPLETE 4+ VIEW  COMPARISON:  CT abdomen and pelvis 01/27/2022  FINDINGS: Osseous demineralization.  Five non-rib-bearing lumbar vertebra.  Multilevel disc space narrowing and endplate spur formation.  Superior endplate compression deformity of  L2 vertebral body with mild anterior height loss, unchanged since 01/27/2022.  No additional fracture, subluxation, or bone destruction.  Atherosclerotic calcifications aorta.  Increased stool throughout proximal half of colon.  IMPRESSION: Chronic superior endplate compression deformity of L2 vertebral body.  Osseous demineralization with multilevel degenerative disc disease changes.  Increased stool in proximal colon.  Aortic Atherosclerosis (ICD10-I70.0).   Electronically Signed By: Ulyses Southward M.D. On: 03/22/2022 21:44 DG HIP UNILAT WITH PELVIS 2-3 VIEWS LEFT  Narrative CLINICAL DATA:  Left hip pain, recent fall, history of non-small cell left upper lobe lung cancer  EXAM: DG HIP (WITH OR WITHOUT PELVIS) 2-3V LEFT  COMPARISON:  PET scan 10/29/2021  FINDINGS: Frontal view of the pelvis as well as frontal and frogleg lateral views of the left hip are obtained. There are no acute displaced fractures. Joint spaces are well preserved.  The hypermetabolic lytic metastases seen within the bilateral iliac bones and lower lumbar spine on prior PET CT are difficult to appreciate on radiograph. Slight asymmetric sclerosis in the right iliac bone near the anterior superior iliac spine could reflect reparative changes at the site of known metastasis.  IMPRESSION: 1. No acute displaced fractures. Specifically, no abnormality of the left hip is identified. 2. The known lytic bony metastases seen on prior PET scans are not readily apparent by x-ray.   Electronically Signed By: Sharlet Salina M.D. On: 05/30/2022 16:50  Complexity Note: Imaging results reviewed.                         ROS  Cardiovascular: Heart trouble and Heart failure Pulmonary or Respiratory: Lung problems Neurological: Stroke (Residual deficits or weakness: .) Psychological-Psychiatric: No reported psychological or psychiatric signs or symptoms such as difficulty sleeping, anxiety, depression,  delusions or hallucinations (schizophrenial), mood swings (bipolar disorders) or suicidal ideations or attempts Gastrointestinal: No reported gastrointestinal signs or symptoms such as vomiting or evacuating blood, reflux, heartburn, alternating episodes of diarrhea and constipation, inflamed or scarred liver, or pancreas or irrregular and/or infrequent bowel movements Genitourinary: No reported renal or genitourinary signs or symptoms such as difficulty voiding or producing urine, peeing blood, non-functioning kidney, kidney stones, difficulty emptying the bladder, difficulty controlling the flow of urine, or chronic kidney disease Hematological: No reported hematological signs or symptoms such as prolonged bleeding, low or poor functioning platelets, bruising or bleeding easily, hereditary bleeding problems, low energy levels due to low  hemoglobin or being anemic Endocrine: High blood sugar controlled without the use of insulin (NIDDM) Rheumatologic: No reported rheumatological signs and symptoms such as fatigue, joint pain, tenderness, swelling, redness, heat, stiffness, decreased range of motion, with or without associated rash Musculoskeletal: Negative for myasthenia gravis, muscular dystrophy, multiple sclerosis or malignant hyperthermia Work History: Disabled  Allergies  Mr. Seelbach is allergic to niaspan [niacin].  Laboratory Chemistry Profile   Renal Lab Results  Component Value Date   BUN 23 05/05/2023   CREATININE 1.01 05/05/2023   LABCREA 162 03/06/2016   BCR SEE NOTE: 10/09/2022   GFRAA >89 03/05/2016   GFRNONAA >60 05/05/2023   SPECGRAV 1.015 04/16/2023   PHUR 6.5 04/16/2023   PROTEINUR NEGATIVE 04/17/2023     Electrolytes Lab Results  Component Value Date   NA 134 (L) 05/05/2023   K 4.3 05/05/2023   CL 99 05/05/2023   CALCIUM 8.8 (L) 05/05/2023   MG 2.1 04/21/2023   PHOS 3.4 04/21/2023     Hepatic Lab Results  Component Value Date   AST 17 05/05/2023   ALT 9  05/05/2023   ALBUMIN 3.6 05/05/2023   ALKPHOS 63 05/05/2023     ID Lab Results  Component Value Date   HIV Non Reactive 06/11/2021   SARSCOV2NAA NEGATIVE 01/31/2023     Bone Lab Results  Component Value Date   VD25OH 35.27 06/21/2021     Endocrine Lab Results  Component Value Date   GLUCOSE 182 (H) 05/05/2023   GLUCOSEU 50 (A) 04/17/2023   HGBA1C 6.6 (H) 10/01/2022   TSH 1.709 03/31/2023   FREET4 0.62 10/21/2021   CRTSLPL 15.9 04/04/2022     Neuropathy Lab Results  Component Value Date   VITAMINB12 398 10/09/2022   FOLATE 4.3 (L) 04/04/2022   HGBA1C 6.6 (H) 10/01/2022   HIV Non Reactive 06/11/2021     CNS No results found for: "COLORCSF", "APPEARCSF", "RBCCOUNTCSF", "WBCCSF", "POLYSCSF", "LYMPHSCSF", "EOSCSF", "PROTEINCSF", "GLUCCSF", "JCVIRUS", "CSFOLI", "IGGCSF", "LABACHR", "ACETBL"   Inflammation (CRP: Acute  ESR: Chronic) Lab Results  Component Value Date   ESRSEDRATE 77 (H) 10/09/2022   LATICACIDVEN 1.8 02/13/2023     Rheumatology No results found for: "RF", "ANA", "LABURIC", "URICUR", "LYMEIGGIGMAB", "LYMEABIGMQN", "HLAB27"   Coagulation Lab Results  Component Value Date   INR 1.0 02/13/2023   LABPROT 13.7 02/13/2023   APTT 23 (L) 01/31/2023   PLT 239 05/05/2023     Cardiovascular Lab Results  Component Value Date   BNP 245.2 (H) 04/17/2023   CKTOTAL 72 04/17/2023   HGB 12.5 (L) 05/05/2023   HCT 38.6 (L) 05/05/2023     Screening Lab Results  Component Value Date   SARSCOV2NAA NEGATIVE 01/31/2023   HIV Non Reactive 06/11/2021     Cancer No results found for: "CEA", "CA125", "LABCA2"   Allergens No results found for: "ALMOND", "APPLE", "ASPARAGUS", "AVOCADO", "BANANA", "BARLEY", "BASIL", "BAYLEAF", "GREENBEAN", "LIMABEAN", "WHITEBEAN", "BEEFIGE", "REDBEET", "BLUEBERRY", "BROCCOLI", "CABBAGE", "MELON", "CARROT", "CASEIN", "CASHEWNUT", "CAULIFLOWER", "CELERY"     Note: Lab results reviewed.  PFSH  Drug: Mr. Marchiano  reports no history of  drug use. Alcohol:  reports no history of alcohol use. Tobacco:  reports that he quit smoking about 32 years ago. His smoking use included cigarettes. He has never used smokeless tobacco. Medical:  has a past medical history of Allergy, Anxiety, BPH (benign prostatic hypertrophy), Cancer (HCC), Carotid artery occlusion, CHF (congestive heart failure) (HCC), Diabetes mellitus, ED (erectile dysfunction), GERD (gastroesophageal reflux disease), Hemorrhagic stroke (HCC) (06/2021), Hyperlipidemia, Hypertension, Lung cancer (  HCC), Neoplasm related pain, 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), and Stroke (HCC). Family: family history includes COPD in his mother; Heart disease in his brother and father.  Past Surgical History:  Procedure Laterality Date   ANTERIOR CERVICAL DECOMP/DISCECTOMY FUSION N/A 02/09/2023   Procedure: C3-5 ANTERIOR CERVICAL DISCECTOMY AND FUSION;  Surgeon: Venetia Night, MD;  Location: [facility] ORS;  Service: Neurosurgery;  Laterality: N/A;   BRONCHIAL NEEDLE ASPIRATION BIOPSY  06/17/2021   Procedure: BRONCHIAL NEEDLE ASPIRATION BIOPSIES;  Surgeon: Lorin Glass, MD;  Location: [facility] ENDOSCOPY;  Service: Pulmonary;;   IR IMAGING GUIDED PORT INSERTION  08/05/2021   MELANOMA EXCISION  2008   Left side of neck   RADIOLOGY WITH ANESTHESIA N/A 04/05/2020   Procedure: MRI SPINE WITOUT CONTRAST;  Surgeon: Radiologist, Medication, MD;  Location: [facility] OR;  Service: Radiology;  Laterality: N/A;   RADIOLOGY WITH ANESTHESIA N/A 08/22/2021   Procedure: MRI BRAIN WITH AND WITHOUT CONTRAST  WITH ANESTHESIA;  Surgeon: Radiologist, Medication, MD;  Location: [facility] OR;  Service: Radiology;  Laterality: N/A;   RADIOLOGY WITH ANESTHESIA N/A 12/17/2021   Procedure: MRI BRAIN WITH AND WITHOUT CONTRAST WITH ANESTHESIA; MRI LUMBER WITH AND WITHOUT CONTRAST;  Surgeon: Radiologist, Medication, MD;  Location: [facility] OR;  Service: Radiology;  Laterality: N/A;   RADIOLOGY WITH ANESTHESIA N/A 04/08/2022   Procedure: MRI LUMBER  SPINE WITH AND WITHOUT CONTRAST;  Surgeon: Radiologist, Medication, MD;  Location: [facility] OR;  Service: Radiology;  Laterality: N/A;   TONSILLECTOMY     VIDEO BRONCHOSCOPY WITH ENDOBRONCHIAL ULTRASOUND N/A 06/17/2021   Procedure: VIDEO BRONCHOSCOPY WITH ENDOBRONCHIAL ULTRASOUND;  Surgeon: Lorin Glass, MD;  Location: [facility] ENDOSCOPY;  Service: Pulmonary;  Laterality: N/A;   Active Ambulatory Problems    Diagnosis Date Noted   Type 2 diabetes mellitus without complications (HCC) 02/07/2010   HYPERCHOLESTEROLEMIA 02/07/2010   Benign hypertension 02/07/2010   CAROTID BRUIT, LEFT 02/07/2010   CHEST PAIN 02/07/2010   Carotid stenosis, bilateral 11/07/2011   Intraparenchymal hemorrhage of brain (HCC) 06/11/2021   Type 2 diabetes mellitus with hyperglycemia (HCC) 06/11/2021   Pseudohyponatremia 06/11/2021   Dehydration 06/11/2021   Leukocytosis 06/11/2021   Hypercalcemia 06/11/2021   Hyperlipidemia 06/12/2021   CAP (community acquired pneumonia) 06/12/2021   Cerebellar bleed (HCC)    Headache 06/13/2021   Pressure injury of skin 06/21/2021   Lumbar pain 01/10/2020   Type 2 diabetes mellitus with hyperlipidemia (HCC) 07/02/2021   Lumbar radiculopathy 03/15/2021   Primary non-small cell carcinoma of upper lobe of left lung (HCC) 07/16/2021   Hemoptysis 07/16/2021   Encounter for antineoplastic chemotherapy 07/17/2021   Goals of care, counseling/discussion 07/23/2021   Pulmonary embolus (HCC) 07/23/2021   Metastasis to bone (HCC) 08/06/2021   Back pain 08/16/2021   Folate deficiency anemia 08/16/2021   B12 deficiency anemia 09/27/2021   Frequent urination 11/11/2021   Neutropenic fever (HCC) 11/18/2021   Essential hypertension 11/18/2021   Dyslipidemia 11/18/2021   Lower abdominal pain 11/18/2021   Anemia of chronic disease    Enteritis    Rhinovirus infection 11/20/2021   Hypophosphatemia 11/20/2021   Chemotherapy induced neutropenia (HCC) 02/13/2022   Thrombocytosis 02/13/2022    Swelling of lower extremity 02/13/2022   Elevated serum creatinine 03/18/2022   Orthostatic hypotension 04/05/2022   Encounter for antineoplastic immunotherapy 05/05/2022   Weakness of left lower extremity 05/26/2022   Weight loss 07/07/2022   Hypokalemia 07/29/2022   Neuropathy 09/08/2022   Weakness 10/01/2022   (HFpEF) heart failure with preserved ejection fraction (HCC) 10/01/2022  Sinusitis 10/01/2022   Seizure (HCC) 10/02/2022   Myocardial ischemia 10/02/2022   Malnutrition of moderate degree 10/03/2022   Cervical myelopathy (HCC) 02/09/2023   Cervical spinal stenosis 02/09/2023   UTI (urinary tract infection) 02/17/2023   Chronic diastolic CHF (congestive heart failure) (HCC) 04/17/2023   Depression with anxiety 04/17/2023   BPH (benign prostatic hyperplasia) 04/17/2023   Seizure disorder (HCC) 04/18/2023   Lumbar spondylosis 05/12/2023   Chronic pain syndrome 05/12/2023   Resolved Ambulatory Problems    Diagnosis Date Noted   Occlusion and stenosis of carotid artery without mention of cerebral infarction 11/07/2011   Past Medical History:  Diagnosis Date   Allergy    Anxiety    BPH (benign prostatic hypertrophy)    Cancer (HCC)    Carotid artery occlusion    CHF (congestive heart failure) (HCC)    Diabetes mellitus    ED (erectile dysfunction)    GERD (gastroesophageal reflux disease)    Hemorrhagic stroke (HCC) 06/2021   Hypertension    Lung cancer (HCC)    Neoplasm related pain    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)    Stroke (HCC)    Constitutional Exam  General appearance: Well nourished, well developed, and well hydrated. In no apparent acute distress Vitals:   05/12/23 1320  BP: 101/67  Pulse: 86  Resp: 16  Temp: 97.9 F (36.6 C)  SpO2: 100%  Weight: 130 lb (59 kg)  Height: 5\' 7"  (1.702 m)   BMI Assessment: Estimated body mass index is 20.36 kg/m as calculated from the following:   Height as of this encounter: 5\' 7"  (1.702 m).   Weight as  of this encounter: 130 lb (59 kg).  BMI interpretation table: BMI level Category Range association with higher incidence of chronic pain  <18 kg/m2 Underweight   18.5-24.9 kg/m2 Ideal body weight   25-29.9 kg/m2 Overweight Increased incidence by 20%  30-34.9 kg/m2 Obese (Class I) Increased incidence by 68%  35-39.9 kg/m2 Severe obesity (Class II) Increased incidence by 136%  >40 kg/m2 Extreme obesity (Class III) Increased incidence by 254%   Patient's current BMI Ideal Body weight  Body mass index is 20.36 kg/m. Ideal body weight: 66.1 kg (145 lb 11.6 oz)   BMI Readings from Last 4 Encounters:  05/12/23 20.36 kg/m  05/05/23 19.91 kg/m  04/30/23 20.20 kg/m  04/28/23 20.27 kg/m   Wt Readings from Last 4 Encounters:  05/12/23 130 lb (59 kg)  05/05/23 127 lb 1.6 oz (57.7 kg)  04/30/23 129 lb (58.5 kg)  04/28/23 129 lb 6.4 oz (58.7 kg)    Psych/Mental status: Alert, oriented x 3 (person, place, & time)       Eyes: PERLA Respiratory: No evidence of acute respiratory distress  Thoracic Spine Area Exam  Skin & Axial Inspection: No masses, redness, or swelling Alignment: Symmetrical Functional ROM: Pain restricted ROM Stability: No instability detected Muscle Tone/Strength: Functionally intact. No obvious neuro-muscular anomalies detected. Sensory (Neurological): Unimpaired Muscle strength & Tone: No palpable anomalies Lumbar Spine Area Exam  Skin & Axial Inspection: No masses, redness, or swelling Alignment: Symmetrical Functional ROM: Pain restricted ROM       Stability: No instability detected Muscle Tone/Strength: Functionally intact. No obvious neuro-muscular anomalies detected. Sensory (Neurological): Musculoskeletal pain pattern- facet mediated Palpation: Complains of area being tender to palpation       Provocative Tests: Hyperextension/rotation test: (+) bilaterally for facet joint pain. Lumbar quadrant test (Kemp's test): (+) bilaterally for facet joint  pain.  Gait & Posture Assessment  Ambulation: Patient came in today in a wheel chair Gait: Significantly limited. Dependent on assistive device to ambulate Posture: Difficulty standing up straight, due to pain  Lower Extremity Exam    Side: Right lower extremity  Side: Left lower extremity  Stability: No instability observed          Stability: No instability observed          Skin & Extremity Inspection: Skin color, temperature, and hair growth are WNL. No peripheral edema or cyanosis. No masses, redness, swelling, asymmetry, or associated skin lesions. No contractures.  Skin & Extremity Inspection: Skin color, temperature, and hair growth are WNL. No peripheral edema or cyanosis. No masses, redness, swelling, asymmetry, or associated skin lesions. No contractures.  Functional ROM: Unrestricted ROM                  Functional ROM: Unrestricted ROM                  Muscle Tone/Strength: Functionally intact. No obvious neuro-muscular anomalies detected.  Muscle Tone/Strength: Functionally intact. No obvious neuro-muscular anomalies detected.  Sensory (Neurological): Unimpaired        Sensory (Neurological): Unimpaired        DTR: Patellar: deferred today Achilles: deferred today Plantar: deferred today  DTR: Patellar: deferred today Achilles: deferred today Plantar: deferred today  Palpation: No palpable anomalies  Palpation: No palpable anomalies    Assessment  Primary Diagnosis & Pertinent Problem List: The primary encounter diagnosis was Lumbar facet arthropathy. Diagnoses of Lumbar spondylosis and Chronic pain syndrome were also pertinent to this visit.  Visit Diagnosis (New problems to examiner): 1. Lumbar facet arthropathy   2. Lumbar spondylosis   3. 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   [name] has a history of greater than 3 months of moderate to severe pain which is resulted in functional impairment.  The patient has tried various conservative therapeutic options such as NSAIDs, Tylenol, muscle relaxants, physical therapy which was inadequately effective.  Patient's pain is predominantly axial with physical exam & L-MRI findings suggestive of facet arthropathy. Lumbar facet medial branch nerve blocks were discussed with the patient.  Risks and benefits were reviewed.  Patient would like to proceed with bilateral L3, L4, L5 medial branch nerve block. We discussed lumbar radiofrequency ablation as well as medial branch peripheral nerve stimulation if he experiences a positive diagnostic lumbar facet medial branch nerve block.  Lung Cancer   He has stage IV lung cancer, currently less aggressive, with significant weight loss of approximately 50 pounds. Cancer and chemotherapy may contribute to chronic pain.  Chronic medication management for cancer pain to continue with cancer team   Imaging Orders         DG PAIN CLINIC C-ARM 1-60 MIN NO REPORT     Procedure Orders         LUMBAR FACET(MEDIAL BRANCH NERVE BLOCK) MBNB      Provider-requested follow-up: Return in about 22 days (around 06/03/2023) for B/L L3, 4, 5 MBNB #1, in clinic IV Versed.  Future Appointments  Date Time Provider Department Center  05/13/2023  3:30 PM [facility] HS VASC 2 [facility]HCVI  [facility]  05/19/2023  1:00 PM Audrie Gallus, RN [facility]-POPH None  05/26/2023  1:00 PM [facility]-PORT FLUSH [facility] None  05/26/2023  1:30 PM Alinda Dooms, NP [facility] None  05/26/2023  2:00 PM [facility]- MO INFUSION CHAIR 10 [facility] None  06/03/2023  1:20 PM Galan Jolly, MD [facility] None  06/05/2023 11:30 AM Vaslow, Georgeanna Lea, MD [facility] None  06/05/2023  1:15 PM Lonna Cobb, Verna Czech, MD [facility]-BUA None  06/16/2023  1:15 PM  [facility]-PORT FLUSH [facility] None  06/16/2023  1:45 PM [name], MD [facility] None  06/16/2023  2:00 PM [facility]- MO INFUSION CHAIR 9 [facility] None  07/14/2023  2:15 PM Windell Norfolk, MD [facility] None  09/01/2023  1:30 PM [facility]OUT[facility]IENT [facility]  09/09/2023  1:30 PM [name], MD [facility]-BRT None  10/29/2023  2:30 PM Venetia Night, MD CNS-CNS None    Duration of encounter: .  Total time on encounter, as per AMA guidelines included both the face-to-face and non-face-to-face time personally spent by the physician and/or other qualified health care professional(s) on the day of the encounter (includes time in activities that require the physician or other qualified health care professional and does not include time in activities normally performed by clinical staff). Physician's time may include the following activities when performed: Preparing to see the patient (e.g., pre-charting review of records, searching for previously ordered imaging, lab work, and nerve conduction tests) Review of prior analgesic pharmacotherapies. Reviewing PMP Interpreting ordered tests (e.g., lab work, imaging, nerve conduction tests) Performing post-procedure evaluations, including interpretation of diagnostic procedures Obtaining and/or reviewing separately obtained history Performing a medically appropriate examination and/or evaluation Counseling and educating the patient/family/caregiver Ordering medications, tests, or procedures Referring and communicating with other health care professionals (when not separately reported) Documenting clinical information in the electronic or other health record Independently interpreting results (not separately reported) and communicating results to the patient/ family/caregiver Care coordination (not separately reported)  Note by: Bianca Jolly, MD (AI and TTS technology used. I apologize for any typographical errors that were not detected and corrected.) Date:  05/12/2023; Time: 3:03 PM

## 2023-05-12 NOTE — Patient Outreach (Signed)
 Care Management  Transitions of Care Program Transitions of Care Post-discharge week 3   05/12/2023 Name: Micheal Donovan MRN: [id number] DOB: 1950/07/29  Subjective: Micheal Donovan is a 73 y.o. year old male who is a primary care patient of Pickard, Priscille Heidelberg, MD. The Care Management team Engaged with patient Engaged with patient by telephone to assess and address transitions of care needs.   Consent to Services:  Patient was given information about care management services, agreed to services, and gave verbal consent to participate.   Assessment: Patient is checking blood pressure 1-2 x per day, Reviewed importance of checking 3 x day and take midodrine as prescribed, pt continues to work with home health PT,  denies any seizure activity, no new concerns voiced.         SDOH Interventions    Flowsheet Row Telephone from 04/22/2023 in [place] [facility] Clinical Support from 03/27/2022 in [facility] Telephone from 11/21/2021 in [facility] Community Care Coordination Clinical Support from 07/31/2021 in [facility] Dept Of [facility]  SDOH Interventions      Food Insecurity Interventions Intervention Not Indicated Intervention Not Indicated -- Intervention Not Indicated  Housing Interventions Intervention Not Indicated Intervention Not Indicated -- Intervention Not Indicated  Transportation Interventions Intervention Not Indicated Intervention Not Indicated Intervention Not Indicated CCAR Zenaida Niece ([facility] Only), Patient Resources (Friends/Family)  Utilities Interventions Intervention Not Indicated Intervention Not Indicated -- --  Alcohol Usage Interventions -- Intervention Not Indicated (Score <7) -- --  Financial Strain Interventions -- Intervention Not Indicated Intervention Not Indicated Intervention Not Indicated  Physical Activity Interventions -- Intervention Not Indicated --  Intervention Not Indicated  Stress Interventions -- Intervention Not Indicated -- --  Social Connections Interventions -- Intervention Not Indicated -- Intervention Not Indicated        Goals Addressed             This Visit's Progress    Transition of Care/ pt will have no readmissions within 30 days       Current Barriers:  Home Health services - [facility] is working with patient in the home Knowledge Deficits related to plan of care for management of Seizure, HF  Patient reports he is eating and drinking well, does not feel as weak has not been ambulating much, uses wheelchair most of time, has walker to use, pt is checking blood pressure 1-2 x per day (not 3 x per day),verbalizes understanding to take midodrine for systolic <110- 3 x per day, pt states systolic readings all are 100-120, states " can't remember the bottom number right now", denies any seizure activity since being home from hospital. Patient saw primary care provider on 04/28/23, neurology on 2/27, has appointment with pain management today, cystoscopy planned for April, no new concerns voiced.  RNCM Clinical Goal(s):  Patient will work with the Care Management team over the next 30 days to address Transition of Care Barriers: Home Health services verbalize understanding of plan for management of Seizure, HF as evidenced by patient report, review of EMR and  through collaboration with RN Care manager, provider, and care team.   Interventions: Evaluation of current treatment plan related to  self management and patient's adherence to plan as established by provider   Heart Failure Interventions:  (Status:  Goal on track:  Yes.) Short Term Goal Discussed the importance of keeping all appointments with provider  Reinforced importance of weighing daily only when pt is able Reinforced hypotension can make patient feel weak Reviewed heart failure action plan Reviewed low sodium diet  Seizure  (Status:  Goal  on track:  NO. Pt is not checking blood pressure 3 x day)  Short Term Goal Evaluation of current treatment plan related to  Seizure ,  home health, self-management and patient's adherence to plan as established by provider. Reinforced plans with patient for ongoing care management follow up and provided patient with direct contact information for care management team Reviewed importance of taking Keppra exactly as prescribed and do not miss any doses Reviewed signs /symptoms of seizure, reportable signs/ symptoms Reviewed importance of continuing to monitor blood pressure throughout the day (at least 3 x per day) and take midodrine for systolic <110, pt verbalizes understanding Reinforced safety precautions and use walker at all times, do not ambulate if dizzy or weak Reinforced safety precautions Reviewed importance of continuing to work with home health and complete prescribed exercises  Patient Goals/Self-Care Activities: Participate in Transition of Care Program/Attend TOC scheduled calls Notify RN Care Manager of TOC call rescheduling needs Take all medications as prescribed Attend all scheduled provider appointments Call pharmacy for medication refills 3-7 days in advance of running out of medications Call provider office for new concerns or questions  call office if I gain more than 2 pounds in one day or 5 pounds in one week use salt in moderation watch for swelling in feet, ankles and legs every day develop a rescue plan follow rescue plan if symptoms flare-up eat more whole grains, fruits and vegetables, lean meats and healthy fats dress right for the weather, hot or cold Weigh daily only when you are able, please record in a log Check blood pressure as instructed (at least 3 x per day) take midodrine as prescribed for systolic (top number) <110 Drink adequate fluids, preferably water Continue working with [facility] Take keppra exactly as prescribed, report any  seizure activity to your doctor, call 911 if needed Low blood pressure can make you feel weak Fall prevention strategies: change positions slowly, use a walker or cane (per provider recommendations) when walking, keep walkways clear, have good lighting in room. It is important to contact your provider if you have any falls. Maintain muscle strength/tone by exercise per provider recommendations.   Follow Up Plan:  Telephone follow up appointment with care management team member scheduled for:  05/19/23 @ 1 pm          Plan: Telephone follow up appointment with care management team member scheduled for: 05/19/23 @ 1 pm The patient has been provided with contact information for the care management team and has been advised to call with any health related questions or concerns.   [name] Shows [facility], BSN RN Care Manager/ Transition of Care [place]/ [facility] [phone]

## 2023-05-12 NOTE — Patient Instructions (Signed)
Preparing for Procedure with Sedation Instructions: . Oral Intake: Do not eat or drink anything for at least 8 hours prior to your procedure. . Transportation: Public transportation is not allowed. Bring an adult driver. The driver must be physically present in our waiting room before any procedure can be started. [name] Physical Assistance: Bring an adult capable of physically assisting you, in the event you need help. . Blood Pressure Medicine: Take your blood pressure medicine with a sip of water the morning of the procedure. . Insulin: Take only  of your normal insulin dose. . Preventing infections: Shower with an antibacterial soap the morning of your procedure. . Build-up your immune system: Take 1000 mg of Vitamin C with every meal (3 times a day) the day prior to your procedure. . Pregnancy: If you are pregnant, call and cancel the procedure. . Sickness: If you have a cold, fever, or any active infections, call and cancel the procedure. . Arrival: You must be in the facility at least 30 minutes prior to your scheduled procedure. . Children: Do not bring children with you. . Dress appropriately: Bring dark clothing that you would not mind if they get stained. . Valuables: Do not bring any jewelry or valuables. Procedure appointments are reserved for interventional treatments only. [name] No Prescription Refills. . No medication changes will be discussed during procedure appointments. No disability issues will be discussed.Facet Blocks Patient Information  Description: The facets are joints in the spine between the vertebrae.  Like any joints in the body, facets can become irritated and painful.  Arthritis can also effect the facets.  By injecting steroids and local anesthetic in and around these joints, we can temporarily block the nerve supply to them.  Steroids act directly on irritated nerves and tissues to reduce selling and inflammation which often leads to decreased pain.  Facet blocks may  be done anywhere along the spine from the neck to the low back depending upon the location of your pain.   After numbing the skin with local anesthetic (like Novocaine), a small needle is passed onto the facet joints under x-ray guidance.  You may experience a sensation of pressure while this is being done.  The entire block usually lasts about 15-25 minutes.   Conditions which may be treated by facet blocks:  Low back/buttock pain Neck/shoulder pain Certain types of headaches  Preparation for the injection:  Do not eat any solid food or dairy products within 8 hours of your appointment. You may drink clear liquid up to 3 hours before appointment.  Clear liquids include water, black coffee, juice or soda.  No milk or cream please. You may take your regular medication, including pain medications, with a sip of water before your appointment.  Diabetics should hold regular insulin (if taken separately) and take 1/2 normal NPH dose the morning of the procedure.  Carry some sugar containing items with you to your appointment. A driver must accompany you and be prepared to drive you home after your procedure. Bring all your current medications with you. An IV may be inserted and sedation may be given at the discretion of the physician. A blood pressure cuff, EKG and other monitors will often be applied during the procedure.  Some patients may need to have extra oxygen administered for a short period. You will be asked to provide medical information, including your allergies and medications, prior to the procedure.  We must know immediately if you are taking blood thinners (like Coumadin/Warfarin) or if  you are allergic to IV iodine contrast (dye).  We must know if you could possible be pregnant.  Possible side-effects:  Bleeding from needle site Infection (rare, may require surgery) Nerve injury (rare) Numbness & tingling (temporary) Difficulty urinating (rare, temporary) Spinal headache (a  headache worse with upright posture) Light-headedness (temporary) Pain at injection site (serveral days) Decreased blood pressure (rare, temporary) Weakness in arm/leg (temporary) Pressure sensation in back/neck (temporary)   Call if you experience:  Fever/chills associated with headache or increased back/neck pain Headache worsened by an upright position New onset, weakness or numbness of an extremity below the injection site Hives or difficulty breathing (go to the emergency room) Inflammation or drainage at the injection site(s) Severe back/neck pain greater than usual New symptoms which are concerning to you  Please note:  Although the local anesthetic injected can often make your back or neck feel good for several hours after the injection, the pain will likely return. It takes 3-7 days for steroids to work.  You may not notice any pain relief for at least one week.  If effective, we will often do a series of 2-3 injections spaced 3-6 weeks apart to maximally decrease your pain.  After the initial series, you may be a candidate for a more permanent nerve block of the facets.  If you have any questions, please call [phone] . [facility]

## 2023-05-12 NOTE — Progress Notes (Signed)
 Safety precautions to be maintained throughout the outpatient stay will include: orient to surroundings, keep bed in low position, maintain call bell within reach at all times, provide assistance with transfer out of bed and ambulation.

## 2023-05-12 NOTE — Patient Instructions (Signed)
 Visit Information  Thank you for taking time to visit with me today. Please don't hesitate to contact me if I can be of assistance to you before our next scheduled telephone appointment.  Following are the goals we discussed today:   Goals Addressed             This Visit's Progress    Transition of Care/ pt will have no readmissions within 30 days       Current Barriers:  Home Health services - [facility] is working with patient in the home Knowledge Deficits related to plan of care for management of Seizure, HF  Patient reports he is eating and drinking well, does not feel as weak has not been ambulating much, uses wheelchair most of time, has walker to use, pt is checking blood pressure 1-2 x per day (not 3 x per day),verbalizes understanding to take midodrine for systolic <110- 3 x per day, pt states systolic readings all are 100-120, states " can't remember the bottom number right now", denies any seizure activity since being home from hospital. Patient saw primary care provider on 04/28/23, neurology on 2/27, has appointment with pain management today, cystoscopy planned for April, no new concerns voiced.  RNCM Clinical Goal(s):  Patient will work with the Care Management team over the next 30 days to address Transition of Care Barriers: Home Health services verbalize understanding of plan for management of Seizure, HF as evidenced by patient report, review of EMR and  through collaboration with RN Care manager, provider, and care team.   Interventions: Evaluation of current treatment plan related to  self management and patient's adherence to plan as established by provider   Heart Failure Interventions:  (Status:  Goal on track:  Yes.) Short Term Goal Discussed the importance of keeping all appointments with provider Reinforced importance of weighing daily only when pt is able Reinforced hypotension can make patient feel weak Reviewed heart failure action  plan Reviewed low sodium diet  Seizure  (Status:  Goal on track:  NO. Pt is not checking blood pressure 3 x day)  Short Term Goal Evaluation of current treatment plan related to  Seizure ,  home health, self-management and patient's adherence to plan as established by provider. Reinforced plans with patient for ongoing care management follow up and provided patient with direct contact information for care management team Reviewed importance of taking Keppra exactly as prescribed and do not miss any doses Reviewed signs /symptoms of seizure, reportable signs/ symptoms Reviewed importance of continuing to monitor blood pressure throughout the day (at least 3 x per day) and take midodrine for systolic <110, pt verbalizes understanding Reinforced safety precautions and use walker at all times, do not ambulate if dizzy or weak Reinforced safety precautions Reviewed importance of continuing to work with home health and complete prescribed exercises  Patient Goals/Self-Care Activities: Participate in Transition of Care Program/Attend [facility] scheduled calls Notify RN Care Manager of TOC call rescheduling needs Take all medications as prescribed Attend all scheduled provider appointments Call pharmacy for medication refills 3-7 days in advance of running out of medications Call provider office for new concerns or questions  call office if I gain more than 2 pounds in one day or 5 pounds in one week use salt in moderation watch for swelling in feet, ankles and legs every day develop a rescue plan follow rescue plan if symptoms flare-up eat more whole grains, fruits and vegetables, lean meats and healthy fats dress right for  the weather, hot or cold Weigh daily only when you are able, please record in a log Check blood pressure as instructed (at least 3 x per day) take midodrine as prescribed for systolic (top number) <110 Drink adequate fluids, preferably water Continue working with [facility] Take keppra exactly as prescribed, report any seizure activity to your doctor, call 911 if needed Low blood pressure can make you feel weak Fall prevention strategies: change positions slowly, use a walker or cane (per provider recommendations) when walking, keep walkways clear, have good lighting in room. It is important to contact your provider if you have any falls. Maintain muscle strength/tone by exercise per provider recommendations.   Follow Up Plan:  Telephone follow up appointment with care management team member scheduled for:  05/19/23 @ 1 pm         )  Our next appointment is by telephone on 05/19/23 at 1 pm  Please call the care guide team at [phone] if you need to cancel or reschedule your appointment.   If you are experiencing a Mental Health or Behavioral Health Crisis or need someone to talk to, please call the Suicide and Crisis Lifeline: 988 call the [place] National Suicide Prevention Lifeline: (707) 555-6992 or TTY: (779)886-6532 TTY 470-086-7145) to talk to a trained counselor call 1-800-273-TALK (toll free, 24 hour hotline) go to [facility] [address] [place] [id number]) call the [facility] Crisis Line: (314)440-3934 call 911   Patient verbalizes understanding of instructions and care plan provided today and agrees to view in MyChart. Active MyChart status and patient understanding of how to access instructions and care plan via MyChart confirmed with patient.     Telephone follow up appointment with care management team member scheduled for: 05/19/23 @ 1 pm  [name] Shows [facility], BSN RN Care Manager/ Transition of Care [place]/ [facility] [phone]

## 2023-05-13 ENCOUNTER — Ambulatory Visit (HOSPITAL_COMMUNITY)
Admission: RE | Admit: 2023-05-13 | Discharge: 2023-05-13 | Disposition: A | Discharge status: Home / Self Care | Payer: PPO | Source: Ambulatory Visit | Attending: Family Medicine | Admitting: Family Medicine

## 2023-05-13 ENCOUNTER — Encounter: Payer: Self-pay | Admitting: Student in an Organized Health Care Education/Training Program

## 2023-05-13 DIAGNOSIS — R0989 Other specified symptoms and signs involving the circulatory and respiratory systems: Secondary | ICD-10-CM | POA: Diagnosis not present

## 2023-05-13 DIAGNOSIS — E1165 Type 2 diabetes mellitus with hyperglycemia: Secondary | ICD-10-CM | POA: Insufficient documentation

## 2023-05-13 DIAGNOSIS — M47816 Spondylosis without myelopathy or radiculopathy, lumbar region: Secondary | ICD-10-CM

## 2023-05-13 DIAGNOSIS — G629 Polyneuropathy, unspecified: Secondary | ICD-10-CM | POA: Diagnosis not present

## 2023-05-13 LAB — VAS US ABI WITH/WO TBI
Left ABI: 0.77
Right ABI: 0.67

## 2023-05-13 NOTE — Telephone Encounter (Signed)
 Thank you for the message.  If he is already tried the nerve blocks and the ablation, we can try a peripheral nerve stimulator. I will place an order for a Sprint L4 medial branch peripheral nerve stimulator.  Please cancel his procedure in early April for diagnostic lumbar facet medial branch nerve blocks. Hopefully we can just do his Sprint peripheral nerve stimulator on April 2 instead.

## 2023-05-14 ENCOUNTER — Other Ambulatory Visit: Payer: Self-pay

## 2023-05-14 ENCOUNTER — Other Ambulatory Visit: Payer: Self-pay | Admitting: Family Medicine

## 2023-05-14 DIAGNOSIS — I739 Peripheral vascular disease, unspecified: Secondary | ICD-10-CM

## 2023-05-14 DIAGNOSIS — I999 Unspecified disorder of circulatory system: Secondary | ICD-10-CM

## 2023-05-15 ENCOUNTER — Ambulatory Visit: Admitting: Neurology

## 2023-05-15 ENCOUNTER — Encounter: Payer: Self-pay | Admitting: Neurology

## 2023-05-15 VITALS — BP 107/58 | HR 73 | Ht 67.0 in | Wt 132.0 lb

## 2023-05-15 DIAGNOSIS — Z5181 Encounter for therapeutic drug level monitoring: Secondary | ICD-10-CM

## 2023-05-15 DIAGNOSIS — R569 Unspecified convulsions: Secondary | ICD-10-CM

## 2023-05-15 NOTE — Progress Notes (Signed)
 [facility]  PATIENT: Micheal Donovan DOB: 1950/06/06  REQUESTING CLINICIAN: Gillis Santa, MD HISTORY FROM: Patient/Chart review  REASON FOR VISIT: Seizure    HISTORICAL  CHIEF COMPLAINT:  Chief Complaint  Patient presents with   New Patient (Initial Visit)    Patient in room #13 with his caregiver Vickie. Patient states he here today to discuss his hospital visit do to a seizure.    HISTORY OF PRESENT ILLNESS:  This is a 73 year old gentleman past medical history of non-small cell lung cancer on Keytruda, history of focal seizure on Keppra, hypertension, hyperlipidemia, diabetes, heart failure, melanoma who is presenting after being admitted to hospital on February 14 for an episode concerning for seizure.  Patient presents today with her caregiver who has been working with patient for short period of time.  Patient is a poor historian tells me that he has started having seizure last February 2024.  Caregiver tells me, per wife, patient has a history of seizure 30 years ago, described as "absence seizure" but likely focal unaware seizure. They presented to the ED on 2/14 for uncontrolled right arm jerking in the setting of self discontinuing his Keppra for the past 3 months.  He tells me today that he did not feel like the Keppra was helping therefore he stopped it.  While in the hospital, he received a total of 3 doses of 2.5 mg of Ativan with resolution of the jerks.  Keppra restarted.  Since discharge from the hospital he has been restarted on Keppra and has not had any additional event.  Denies any side effect from the medication.   Handedness: Right handed   Onset: 30 years ago and restarted last year   Seizure Type: Right arm jerking with preserve awareness.   Current frequency: Last event February 14  Any injuries from seizures: Denies   Seizure risk factors: None reported   Previous ASMs: Levetiracetam   Currenty ASMs: Levetiracetam 500 mg twice  daily   ASMs side effects: Denies   Brain Images: no acute abnormality   Previous EEGs: mild diffuse slow    OTHER MEDICAL CONDITIONS: Non-small cell lung cancer on Keytruda, history of focal seizure on Keppra, hypertension, hyperlipidemia, diabetes, heart failure, melanoma  REVIEW OF SYSTEMS: Full 14 system review of systems performed and negative with exception of: As noted in the HPI   ALLERGIES: Allergies  Allergen Reactions   Niaspan [Niacin]     FLUSHING    HOME MEDICATIONS: Outpatient Medications Prior to Visit  Medication Sig Dispense Refill   ALPRAZolam (XANAX) 0.5 MG tablet Take 1 tablet (0.5 mg total) by mouth at bedtime as needed for anxiety. 15 tablet 0   celecoxib (CELEBREX) 200 MG capsule Take 1 capsule (200 mg total) by mouth 2 (two) times daily. 60 capsule 0   citalopram (CELEXA) 10 MG tablet Take 1 tablet (10 mg total) by mouth daily. 30 tablet 3   docusate sodium (COLACE) 100 MG capsule Take 100 mg by mouth 2 (two) times daily as needed for mild constipation.     fentaNYL (DURAGESIC) 75 MCG/HR Place 1 patch onto the skin every 3 (three) days. 10 patch 0   gabapentin (NEURONTIN) 300 MG capsule Take 2 capsules (600 mg total) by mouth 2 (two) times daily. 120 capsule 2   levETIRAcetam (KEPPRA) 500 MG tablet Take 1 tablet (500 mg total) by mouth 2 (two) times daily. 180 tablet 3   methocarbamol (ROBAXIN) 500 MG tablet Take 1 tablet (500 mg total)  by mouth every 6 (six) hours as needed for muscle spasms. 120 tablet 0   NOVOLOG FLEXPEN 100 UNIT/ML FlexPen Inject 0-10 Units into the skin daily as needed for high blood sugar (BG > 200).     oxyCODONE-acetaminophen (PERCOCET) 7.5-325 MG tablet Take 1 tablet by mouth every 4 (four) hours as needed for severe pain (pain score 7-10). Do not take with cough medication or other pain medication 60 tablet 0   tamsulosin (FLOMAX) 0.4 MG CAPS capsule Take 1 capsule (0.4 mg total) by mouth daily. 30 capsule 2   insulin glargine  (LANTUS) 100 UNIT/ML injection Inject 0-30 Units into the skin daily as needed (High Blood Sugar over 150). (Patient not taking: Reported on 05/15/2023)     Insulin Pen Needle (B-D UF III MINI PEN NEEDLES) 31G X 5 MM MISC Use to inject insulin daily. (Patient not taking: Reported on 05/15/2023) 100 each 3   megestrol (MEGACE) 40 MG tablet Take 1 tablet (40 mg total) by mouth daily. (Patient not taking: Reported on 05/15/2023) 30 tablet 3   midodrine (PROAMATINE) 10 MG tablet Take 1 tablet (10 mg total) by mouth 3 (three) times daily as needed (Systolic BP less than 110 mmHg). (Patient not taking: Reported on 05/15/2023) 90 tablet 0   Nystatin (GERHARDT'S BUTT CREAM) CREA Apply 14 Applications topically 2 (two) times daily. (Patient not taking: Reported on 05/15/2023) 1 each 0   omeprazole (PRILOSEC) 20 MG capsule Take 20 mg by mouth daily. (Patient not taking: Reported on 05/15/2023)     senna (SENOKOT) 8.6 MG TABS tablet Take 1 tablet (8.6 mg total) by mouth 2 (two) times daily as needed for mild constipation. (Patient not taking: Reported on 05/15/2023) 30 tablet 0   Facility-Administered Medications Prior to Visit  Medication Dose Route Frequency Provider Last Rate Last Admin   heparin lock flush 100 UNIT/ML injection             PAST MEDICAL HISTORY: Past Medical History:  Diagnosis Date   Allergy    Anxiety    BPH (benign prostatic hypertrophy)    Cancer (HCC)    Melanoma on Neck    2008   Carotid artery occlusion    CHF (congestive heart failure) (HCC)    Diabetes mellitus    type 2   ED (erectile dysfunction)    GERD (gastroesophageal reflux disease)    Hemorrhagic stroke (HCC) 06/2021   Hyperlipidemia    Hypertension    Lung cancer (HCC)    Neoplasm related pain    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)    Stroke [facility])     PAST SURGICAL HISTORY: Past Surgical History:  Procedure Laterality Date   ANTERIOR CERVICAL DECOMP/DISCECTOMY FUSION N/A 02/09/2023   Procedure: C3-5  ANTERIOR CERVICAL DISCECTOMY AND FUSION;  Surgeon: Venetia Night, MD;  Location: [facility] ORS;  Service: Neurosurgery;  Laterality: N/A;   BRONCHIAL NEEDLE ASPIRATION BIOPSY  06/17/2021   Procedure: BRONCHIAL NEEDLE ASPIRATION BIOPSIES;  Surgeon: Lorin Glass, MD;  Location: [facility] ENDOSCOPY;  Service: Pulmonary;;   IR IMAGING GUIDED PORT INSERTION  08/05/2021   MELANOMA EXCISION  2008   Left side of neck   RADIOLOGY WITH ANESTHESIA N/A 04/05/2020   Procedure: MRI SPINE WITOUT CONTRAST;  Surgeon: Radiologist, Medication, MD;  Location: [facility] OR;  Service: Radiology;  Laterality: N/A;   RADIOLOGY WITH ANESTHESIA N/A 08/22/2021   Procedure: MRI BRAIN WITH AND WITHOUT CONTRAST  WITH ANESTHESIA;  Surgeon: Radiologist, Medication, MD;  Location: [facility] OR;  Service: Radiology;  Laterality: N/A;   RADIOLOGY WITH ANESTHESIA N/A 12/17/2021   Procedure: MRI BRAIN WITH AND WITHOUT CONTRAST WITH ANESTHESIA; MRI LUMBER WITH AND WITHOUT CONTRAST;  Surgeon: Radiologist, Medication, MD;  Location: [facility] OR;  Service: Radiology;  Laterality: N/A;   RADIOLOGY WITH ANESTHESIA N/A 04/08/2022   Procedure: MRI LUMBER SPINE WITH AND WITHOUT CONTRAST;  Surgeon: Radiologist, Medication, MD;  Location: [facility] OR;  Service: Radiology;  Laterality: N/A;   TONSILLECTOMY     VIDEO BRONCHOSCOPY WITH ENDOBRONCHIAL ULTRASOUND N/A 06/17/2021   Procedure: VIDEO BRONCHOSCOPY WITH ENDOBRONCHIAL ULTRASOUND;  Surgeon: Lorin Glass, MD;  Location: [facility] ENDOSCOPY;  Service: Pulmonary;  Laterality: N/A;    FAMILY HISTORY: Family History  Problem Relation Age of Onset   COPD Mother    Heart disease Father    Heart disease Brother        MI at age 98   Stroke Neg Hx     SOCIAL HISTORY: Social History   Socioeconomic History   Marital status: Married    Spouse name: Not on file   Number of children: Not on file   Years of education: Not on file   Highest education level: Not on file  Occupational History   Not on file  Tobacco Use   Smoking  status: Former    Current packs/day: 0.00    Types: Cigarettes    Quit date: 07/21/1990    Years since quitting: 32.8   Smokeless tobacco: Never  Vaping Use   Vaping status: Never Used  Substance and Sexual Activity   Alcohol use: No   Drug use: No   Sexual activity: Not Currently  Other Topics Concern   Not on file  Social History Narrative   Not on file   Social Drivers of Health   Financial Resource Strain: Low Risk  (04/17/2022)   Overall Financial Resource Strain (CARDIA)    Difficulty of Paying Living Expenses: Not hard at all  Food Insecurity: No Food Insecurity (04/22/2023)   Hunger Vital Sign    Worried About Running Out of Food in the Last Year: Never true    Ran Out of Food in the Last Year: Never true  Transportation Needs: No Transportation Needs (04/22/2023)   PRAPARE - Administrator, Civil Service (Medical): No    Lack of Transportation (Non-Medical): No  Physical Activity: Inactive (03/27/2022)   Exercise Vital Sign    Days of Exercise per Week: 0 days    Minutes of Exercise per Session: 0 min  Stress: Stress Concern Present (03/27/2022)   [employer] of Occupational Health - Occupational Stress Questionnaire    Feeling of Stress : To some extent  Social Connections: Patient Unable To Answer (04/19/2023)   Social Connection and Isolation Panel [NHANES]    Frequency of Communication with Friends and Family: Patient unable to answer    Frequency of Social Gatherings with Friends and Family: Patient unable to answer    Attends Religious Services: Patient unable to answer    Active Member of Clubs or Organizations: Patient unable to answer    Attends Banker Meetings: Patient unable to answer    Marital Status: Patient unable to answer  Intimate Partner Violence: Not At Risk (04/22/2023)   Humiliation, Afraid, Rape, and Kick questionnaire    Fear of Current or Ex-Partner: No    Emotionally Abused: No    Physically Abused: No     Sexually Abused: No    PHYSICAL EXAM  GENERAL EXAM/CONSTITUTIONAL: Vitals:  Vitals:   05/15/23 1114  BP: (!) 107/58  Pulse: 73  Weight: 132 lb (59.9 kg)  Height: 5\' 7"  (1.702 m)   Body mass index is 20.67 kg/m. Wt Readings from Last 3 Encounters:  05/15/23 132 lb (59.9 kg)  05/12/23 130 lb (59 kg)  05/05/23 127 lb 1.6 oz (57.7 kg)   Patient is in no distress; well developed, nourished and groomed; neck is supple  MUSCULOSKELETAL: Gait, strength, tone, movements noted in Neurologic exam below  NEUROLOGIC: MENTAL STATUS:      No data to display         awake, alert, oriented to person, place and time recent and remote memory intact normal attention and concentration language fluent, comprehension intact, naming intact fund of knowledge appropriate  CRANIAL NERVE:  2nd, 3rd, 4th, 6th - Visual fields full to confrontation, extraocular muscles intact, no nystagmus 5th - facial sensation symmetric 7th - facial strength symmetric 8th - hearing intact 9th - palate elevates symmetrically, uvula midline 11th - shoulder shrug symmetric 12th - tongue protrusion midline  MOTOR:  normal bulk and tone, full strength in the BUE, at least antigravity in the BLE  SENSORY:  normal and symmetric to light touch  COORDINATION:  finger-nose-finger, fine finger movements normal  GAIT/STATION:  Deferred, using a wheelchair    DIAGNOSTIC DATA (LABS, IMAGING, TESTING) - I reviewed patient records, labs, notes, testing and imaging myself where available.  Lab Results  Component Value Date   WBC 8.3 05/05/2023   HGB 12.5 (L) 05/05/2023   HCT 38.6 (L) 05/05/2023   MCV 91.7 05/05/2023   PLT 239 05/05/2023      Component Value Date/Time   NA 134 (L) 05/05/2023 1256   K 4.3 05/05/2023 1256   CL 99 05/05/2023 1256   CO2 26 05/05/2023 1256   GLUCOSE 182 (H) 05/05/2023 1256   BUN 23 05/05/2023 1256   CREATININE 1.01 05/05/2023 1256   CREATININE 1.11 10/09/2022 1500    CALCIUM 8.8 (L) 05/05/2023 1256   PROT 6.6 05/05/2023 1256   ALBUMIN 3.6 05/05/2023 1256   AST 17 05/05/2023 1256   AST 18 07/17/2021 1345   ALT 9 05/05/2023 1256   ALT 20 07/17/2021 1345   ALKPHOS 63 05/05/2023 1256   BILITOT 0.5 05/05/2023 1256   BILITOT 0.6 07/17/2021 1345   GFRNONAA >60 05/05/2023 1256   GFRNONAA >60 07/17/2021 1345   GFRNONAA 88 03/05/2016 0824   GFRAA >89 03/05/2016 0824   Lab Results  Component Value Date   CHOL 122 10/02/2022   HDL 18 (L) 10/02/2022   LDLCALC 88 10/02/2022   TRIG 80 10/02/2022   Lab Results  Component Value Date   HGBA1C 6.6 (H) 10/01/2022   Lab Results  Component Value Date   VITAMINB12 398 10/09/2022   Lab Results  Component Value Date   TSH 1.709 03/31/2023    MRI Brain 04/18/2023 1. No evidence of acute intracranial abnormality or metastatic disease. 2. Remote hemorrhagic infarct in the left cerebellum   EEG 10/02/2022 This EEG was obtained while awake and asleep and is abnormal due to mild diffuse slowing indicative of global cerebral dysfunction. Epileptiform abnormalities were not seen during this recording.   I personally reviewed brain Images and previous EEG reports.   ASSESSMENT AND PLAN  73 y.o. year old male  with non-small cell lung cancer on Keytruda, history of focal seizure on Keppra, hypertension, hyperlipidemia, diabetes, heart failure, melanoma who is presenting after a  seizure in the setting of medication noncompliance. Keppra restarted and he has been doing well since. Plan will be to continue patient on Keppra 500 mg twice day, will check a Keppra level and I will see him in 6 months or sooner if worse.    1. Seizures (HCC)   2. Therapeutic drug monitoring     Patient Instructions  Continue with Keppra 500 mg twice daily  Will check a Keppra level  Continue your other medications  Return in 6 months or sooner if worse    Per [facility] statutes, patients with seizures are not allowed to  drive until they have been seizure-free for six months.  Other recommendations include using caution when using heavy equipment or power tools. Avoid working on ladders or at heights. Take showers instead of baths.  Do not swim alone.  Ensure the water temperature is not too high on the home water heater. Do not go swimming alone. Do not lock yourself in a room alone (i.e. bathroom). When caring for infants or small children, sit down when holding, feeding, or changing them to minimize risk of injury to the child in the event you have a seizure. Maintain good sleep hygiene. Avoid alcohol.  Also recommend adequate sleep, hydration, good diet and minimize stress.   During the Seizure  - First, ensure adequate ventilation and place patients on the floor on their left side  Loosen clothing around the neck and ensure the airway is patent. If the patient is clenching the teeth, do not force the mouth open with any object as this can cause severe damage - Remove all items from the surrounding that can be hazardous. The patient may be oblivious to what's happening and may not even know what he or she is doing. If the patient is confused and wandering, either gently guide him/her away and block access to outside areas - Reassure the individual and be comforting - Call 911. In most cases, the seizure ends before EMS arrives. However, there are cases when seizures may last over 3 to 5 minutes. Or the individual may have developed breathing difficulties or severe injuries. If a pregnant patient or a person with diabetes develops a seizure, it is prudent to call an ambulance. - Finally, if the patient does not regain full consciousness, then call EMS. Most patients will remain confused for about 45 to 90 minutes after a seizure, so you must use judgment in calling for help. - Avoid restraints but make sure the patient is in a bed with padded side rails - Place the individual in a lateral position with the neck  slightly flexed; this will help the saliva drain from the mouth and prevent the tongue from falling backward - Remove all nearby furniture and other hazards from the area - Provide verbal assurance as the individual is regaining consciousness - Provide the patient with privacy if possible - Call for help and start treatment as ordered by the caregiver   After the Seizure (Postictal Stage)  After a seizure, most patients experience confusion, fatigue, muscle pain and/or a headache. Thus, one should permit the individual to sleep. For the next few days, reassurance is essential. Being calm and helping reorient the person is also of importance.  Most seizures are painless and end spontaneously. Seizures are not harmful to others but can lead to complications such as stress on the lungs, brain and the heart. Individuals with prior lung problems may develop labored breathing and respiratory distress.  Discussed Patients with epilepsy have a small risk of sudden unexpected death, a condition referred to as sudden unexpected death in epilepsy (SUDEP). SUDEP is defined specifically as the sudden, unexpected, witnessed or unwitnessed, nontraumatic and nondrowning death in patients with epilepsy with or without evidence for a seizure, and excluding documented status epilepticus, in which post mortem examination does not reveal a structural or toxicologic cause for death     Orders Placed This Encounter  Procedures   Levetiracetam level    No orders of the defined types were placed in this encounter.   Return in about 6 months (around 11/15/2023).    Windell Norfolk, MD 05/15/2023, 1:11 PM  [facility] [address], [address] [place], [place] [address] [phone]

## 2023-05-15 NOTE — Patient Instructions (Signed)
 Continue with Keppra 500 mg twice daily  Will check a Keppra level  Continue your other medications  Return in 6 months or sooner if worse

## 2023-05-16 LAB — LEVETIRACETAM LEVEL: Levetiracetam Lvl: 20.8 ug/mL (ref 10.0–40.0)

## 2023-05-18 ENCOUNTER — Encounter: Payer: Self-pay | Admitting: Neurology

## 2023-05-19 ENCOUNTER — Other Ambulatory Visit: Payer: Self-pay | Admitting: *Deleted

## 2023-05-19 ENCOUNTER — Encounter: Payer: Self-pay | Admitting: *Deleted

## 2023-05-19 NOTE — Patient Outreach (Signed)
 Care Management  Transitions of Care Program Transitions of Care Post-discharge week 4   05/19/2023 Name: Micheal Donovan MRN: [id number] DOB: February 03, 1951  Subjective: Micheal Donovan is a 73 y.o. year old male who is a primary care patient of Pickard, Priscille Heidelberg, MD. The Care Management team Engaged with patient Engaged with patient by telephone to assess and address transitions of care needs.   Assessment: Patient reports he is feeling better, stronger since working with home health PT, checking blood pressure once daily (does not want to check TID), has stopped taking midodrine, does not want to take,  TOC case closure, pt reports no further needs for care management.         SDOH Interventions    Flowsheet Row Telephone from 04/22/2023 in [place] [facility] Clinical Support from 03/27/2022 in [facility] Telephone from 11/21/2021 in [facility] Community Care Coordination Clinical Support from 07/31/2021 in [facility] Dept Of [facility]  SDOH Interventions      Food Insecurity Interventions Intervention Not Indicated Intervention Not Indicated -- Intervention Not Indicated  Housing Interventions Intervention Not Indicated Intervention Not Indicated -- Intervention Not Indicated  Transportation Interventions Intervention Not Indicated Intervention Not Indicated Intervention Not Indicated CCAR Zenaida Niece ( Cancer Ctr. Only), Patient Resources (Friends/Family)  Utilities Interventions Intervention Not Indicated Intervention Not Indicated -- --  Alcohol Usage Interventions -- Intervention Not Indicated (Score <7) -- --  Financial Strain Interventions -- Intervention Not Indicated Intervention Not Indicated Intervention Not Indicated  Physical Activity Interventions -- Intervention Not Indicated -- Intervention Not Indicated  Stress Interventions -- Intervention Not Indicated -- --  Social  Connections Interventions -- Intervention Not Indicated -- Intervention Not Indicated        Goals Addressed             This Visit's Progress    COMPLETED: Transition of Care/ pt will have no readmissions within 30 days       Current Barriers:  Home Health services - [facility] is working with patient in the home Knowledge Deficits related to plan of care for management of Seizure, HF  Patient reports he is eating and drinking well, does not feel as weak has not been ambulating much, uses wheelchair most of time, has walker to use, pt is checking blood pressure 1-2 x per day (not 3 x per day),verbalizes understanding to take midodrine for systolic <110- 3 x per day, pt states systolic readings all are 100-120, states " can't remember the bottom number right now", denies any seizure activity since being home from hospital. Patient saw primary care provider on 04/28/23, neurology on 2/27, has appointment with pain management today, cystoscopy planned for April, no new concerns voiced. 05/19/23- patient states " doing well, checking blood pressure once daily"  reports systolic readings 105-120, pt is not going to check blood pressure TID and is not taking midodrine (states MD is aware that pt not taking) states " I don't want to take it", denies seizure activity, continues to work with home health PT and feels this is beneficial. Patient states he does not want to continue with longitudinal care manager but if he needs anything in the future he will let Dr. Tanya Nones know.  RNCM Clinical Goal(s):  Patient will work with the Care Management team over the next 30 days to address Transition of Care Barriers: Home Health services verbalize understanding of plan  for management of Seizure, HF as evidenced by patient report, review of EMR and  through collaboration with RN Care manager, provider, and care team.   Interventions: Evaluation of current treatment plan related to  self management  and patient's adherence to plan as established by provider   Heart Failure Interventions:  (Status:  Goal on track:  Yes.) Short Term Goal Discussed the importance of keeping all appointments with provider Reinforced importance of weighing daily only when pt is able Reinforced hypotension can make patient feel weak, importance of taking midodrine Reinforced heart failure action plan Reinforced low sodium diet Reviewed plan of care including case closure from [facility] program, pt does not feel he has any further needs for care management.  Seizure  (Status:  Goal on track:  NO. Pt is not checking blood pressure 3 x day)  Short Term Goal Evaluation of current treatment plan related to  Seizure ,  home health, self-management and patient's adherence to plan as established by provider. Reinforced plans with patient for ongoing care management follow up and provided patient with direct contact information for care management team Reviewed importance of taking Keppra exactly as prescribed and do not miss any doses Reinforced signs /symptoms of seizure, reportable signs/ symptoms Reviewed importance of continuing to monitor blood pressure throughout the day (at least 3 x per day) and take midodrine for systolic <110, pt verbalizes understanding Reviewed safety precautions and use walker at all times, do not ambulate if dizzy or weak Reviewed safety precautions Reinforced importance of continuing to work with home health and complete prescribed exercises  Patient Goals/Self-Care Activities: Participate in Transition of Care Program/Attend TOC scheduled calls Notify RN Care Manager of TOC call rescheduling needs Take all medications as prescribed Attend all scheduled provider appointments Call pharmacy for medication refills 3-7 days in advance of running out of medications Call provider office for new concerns or questions  call office if I gain more than 2 pounds in one day or 5 pounds in one  week use salt in moderation watch for swelling in feet, ankles and legs every day develop a rescue plan follow rescue plan if symptoms flare-up eat more whole grains, fruits and vegetables, lean meats and healthy fats dress right for the weather, hot or cold Weigh daily only when you are able, please record in a log Check blood pressure as instructed (at least 3 x per day) take midodrine as prescribed for systolic (top number) <110 Drink adequate fluids, preferably water Continue working with [facility] Take keppra exactly as prescribed, report any seizure activity to your doctor, call 911 if needed Low blood pressure can make you feel weak Fall prevention strategies: change positions slowly, use a walker or cane (per provider recommendations) when walking, keep walkways clear, have good lighting in room. It is important to contact your provider if you have any falls. Maintain muscle strength/tone by exercise per provider recommendations.  Case closure TOC program, please let your primary care provider know if you have any future care management needs.  Follow Up Plan:  The patient has been provided with contact information for the care management team and has been advised to call with any health related questions or concerns.  No further follow up required: case closure          Plan: The patient has been provided with contact information for the care management team and has been advised to call with any health related questions or concerns.  Case closure.  [name] Shows [facility], BSN RN Care Manager/ Transition of Care [place]/ [facility] [phone]

## 2023-05-19 NOTE — Patient Instructions (Signed)
 Visit Information  Thank you for taking time to visit with me today. Please don't hesitate to contact me if I can be of assistance to you before our next scheduled telephone appointment.  Following are the goals we discussed today:   Goals Addressed             This Visit's Progress    COMPLETED: Transition of Care/ pt will have no readmissions within 30 days       Current Barriers:  Home Health services - [facility] is working with patient in the home Knowledge Deficits related to plan of care for management of Seizure, HF  Patient reports he is eating and drinking well, does not feel as weak has not been ambulating much, uses wheelchair most of time, has walker to use, pt is checking blood pressure 1-2 x per day (not 3 x per day),verbalizes understanding to take midodrine for systolic <110- 3 x per day, pt states systolic readings all are 100-120, states " can't remember the bottom number right now", denies any seizure activity since being home from hospital. Patient saw primary care provider on 04/28/23, neurology on 2/27, has appointment with pain management today, cystoscopy planned for April, no new concerns voiced. 05/19/23- patient states " doing well, checking blood pressure once daily"  reports systolic readings 105-120, pt is not going to check blood pressure TID and is not taking midodrine (states MD is aware that pt not taking) states " I don't want to take it", denies seizure activity, continues to work with home health PT and feels this is beneficial. Patient states he does not want to continue with longitudinal care manager but if he needs anything in the future he will let Dr. Tanya Nones know.  RNCM Clinical Goal(s):  Patient will work with the Care Management team over the next 30 days to address Transition of Care Barriers: Home Health services verbalize understanding of plan for management of Seizure, HF as evidenced by patient report, review of EMR and  through  collaboration with RN Care manager, provider, and care team.   Interventions: Evaluation of current treatment plan related to  self management and patient's adherence to plan as established by provider   Heart Failure Interventions:  (Status:  Goal on track:  Yes.) Short Term Goal Discussed the importance of keeping all appointments with provider Reinforced importance of weighing daily only when pt is able Reinforced hypotension can make patient feel weak, importance of taking midodrine Reinforced heart failure action plan Reinforced low sodium diet Reviewed plan of care including case closure from [facility] program, pt does not feel he has any further needs for care management.  Seizure  (Status:  Goal on track:  NO. Pt is not checking blood pressure 3 x day)  Short Term Goal Evaluation of current treatment plan related to  Seizure ,  home health, self-management and patient's adherence to plan as established by provider. Reinforced plans with patient for ongoing care management follow up and provided patient with direct contact information for care management team Reviewed importance of taking Keppra exactly as prescribed and do not miss any doses Reinforced signs /symptoms of seizure, reportable signs/ symptoms Reviewed importance of continuing to monitor blood pressure throughout the day (at least 3 x per day) and take midodrine for systolic <110, pt verbalizes understanding Reviewed safety precautions and use walker at all times, do not ambulate if dizzy or weak Reviewed safety precautions Reinforced importance of continuing to work with home health and complete  prescribed exercises  Patient Goals/Self-Care Activities: Participate in Transition of Care Program/Attend TOC scheduled calls Notify RN Care Manager of TOC call rescheduling needs Take all medications as prescribed Attend all scheduled provider appointments Call pharmacy for medication refills 3-7 days in advance of running out  of medications Call provider office for new concerns or questions  call office if I gain more than 2 pounds in one day or 5 pounds in one week use salt in moderation watch for swelling in feet, ankles and legs every day develop a rescue plan follow rescue plan if symptoms flare-up eat more whole grains, fruits and vegetables, lean meats and healthy fats dress right for the weather, hot or cold Weigh daily only when you are able, please record in a log Check blood pressure as instructed (at least 3 x per day) take midodrine as prescribed for systolic (top number) <110 Drink adequate fluids, preferably water Continue working with [facility] Take keppra exactly as prescribed, report any seizure activity to your doctor, call 911 if needed Low blood pressure can make you feel weak Fall prevention strategies: change positions slowly, use a walker or cane (per provider recommendations) when walking, keep walkways clear, have good lighting in room. It is important to contact your provider if you have any falls. Maintain muscle strength/tone by exercise per provider recommendations.  Case closure TOC program, please let your primary care provider know if you have any future care management needs.  Follow Up Plan:  The patient has been provided with contact information for the care management team and has been advised to call with any health related questions or concerns.  No further follow up required: case closure          Please call the care guide team at [phone] if you need to cancel or reschedule your appointment.   If you are experiencing a Mental Health or Behavioral Health Crisis or need someone to talk to, please call the Suicide and Crisis Lifeline: 988 call the [place] National Suicide Prevention Lifeline: 228-439-6343 or TTY: 651-112-2903 TTY 601-765-9513) to talk to a trained counselor call 1-800-273-TALK (toll free, 24 hour hotline) go to [facility] [address] [place] [phone]) call the [facility] Crisis Line: 4148622939 call 911   Patient verbalizes understanding of instructions and care plan provided today and agrees to view in MyChart. Active MyChart status and patient understanding of how to access instructions and care plan via MyChart confirmed with patient.     [name] Shows [facility], BSN RN Care Manager/ Transition of Care [place]/ [facility] [phone]

## 2023-05-26 ENCOUNTER — Inpatient Hospital Stay (HOSPITAL_BASED_OUTPATIENT_CLINIC_OR_DEPARTMENT_OTHER): Payer: PPO | Admitting: Nurse Practitioner

## 2023-05-26 ENCOUNTER — Inpatient Hospital Stay

## 2023-05-26 ENCOUNTER — Encounter: Payer: Self-pay | Admitting: Nurse Practitioner

## 2023-05-26 ENCOUNTER — Inpatient Hospital Stay: Payer: PPO

## 2023-05-26 ENCOUNTER — Telehealth: Payer: Self-pay

## 2023-05-26 VITALS — BP 113/75 | HR 89 | Temp 96.8°F | Resp 18 | Wt 124.5 lb

## 2023-05-26 DIAGNOSIS — C3412 Malignant neoplasm of upper lobe, left bronchus or lung: Secondary | ICD-10-CM

## 2023-05-26 DIAGNOSIS — R54 Age-related physical debility: Secondary | ICD-10-CM | POA: Diagnosis not present

## 2023-05-26 DIAGNOSIS — Z5112 Encounter for antineoplastic immunotherapy: Secondary | ICD-10-CM

## 2023-05-26 LAB — CBC WITH DIFFERENTIAL/PLATELET
Abs Immature Granulocytes: 0.05 10*3/uL (ref 0.00–0.07)
Basophils Absolute: 0.1 10*3/uL (ref 0.0–0.1)
Basophils Relative: 1 %
Eosinophils Absolute: 0.4 10*3/uL (ref 0.0–0.5)
Eosinophils Relative: 6 %
HCT: 38.4 % — ABNORMAL LOW (ref 39.0–52.0)
Hemoglobin: 12.4 g/dL — ABNORMAL LOW (ref 13.0–17.0)
Immature Granulocytes: 1 %
Lymphocytes Relative: 12 %
Lymphs Abs: 0.8 10*3/uL (ref 0.7–4.0)
MCH: 29.6 pg (ref 26.0–34.0)
MCHC: 32.3 g/dL (ref 30.0–36.0)
MCV: 91.6 fL (ref 80.0–100.0)
Monocytes Absolute: 0.5 10*3/uL (ref 0.1–1.0)
Monocytes Relative: 8 %
Neutro Abs: 4.7 10*3/uL (ref 1.7–7.7)
Neutrophils Relative %: 72 %
Platelets: 302 10*3/uL (ref 150–400)
RBC: 4.19 MIL/uL — ABNORMAL LOW (ref 4.22–5.81)
RDW: 14 % (ref 11.5–15.5)
WBC: 6.5 10*3/uL (ref 4.0–10.5)
nRBC: 0 % (ref 0.0–0.2)

## 2023-05-26 LAB — COMPREHENSIVE METABOLIC PANEL
ALT: 11 U/L (ref 0–44)
AST: 20 U/L (ref 15–41)
Albumin: 3.4 g/dL — ABNORMAL LOW (ref 3.5–5.0)
Alkaline Phosphatase: 61 U/L (ref 38–126)
Anion gap: 11 (ref 5–15)
BUN: 16 mg/dL (ref 8–23)
CO2: 24 mmol/L (ref 22–32)
Calcium: 8.8 mg/dL — ABNORMAL LOW (ref 8.9–10.3)
Chloride: 101 mmol/L (ref 98–111)
Creatinine, Ser: 1.06 mg/dL (ref 0.61–1.24)
GFR, Estimated: >60 mL/min (ref >60)
Glucose, Bld: 136 mg/dL — ABNORMAL HIGH (ref 70–99)
Potassium: 3.9 mmol/L (ref 3.5–5.1)
Sodium: 136 mmol/L (ref 135–145)
Total Bilirubin: 0.8 mg/dL (ref 0.0–1.2)
Total Protein: 6.9 g/dL (ref 6.5–8.1)

## 2023-05-26 MED ORDER — HEPARIN SOD (PORK) LOCK FLUSH 100 UNIT/ML IV SOLN
500.0000 [IU] | Freq: Once | INTRAVENOUS | Status: AC | PRN
  Administered 2023-05-26: 500 [IU]
  Filled 2023-05-26: qty 5

## 2023-05-26 MED ORDER — SODIUM CHLORIDE 0.9 % IV SOLN
Freq: Once | INTRAVENOUS | Status: AC
Start: 2023-05-26 — End: 2023-05-26
  Filled 2023-05-26: qty 250

## 2023-05-26 MED ORDER — SODIUM CHLORIDE 0.9 % IV SOLN
200.0000 mg | Freq: Once | INTRAVENOUS | Status: AC
  Administered 2023-05-26: 200 mg via INTRAVENOUS
  Filled 2023-05-26: qty 200

## 2023-05-26 NOTE — Progress Notes (Signed)
 Hematology/Oncology Progress Note [phone] [phone]   CHIEF COMPLAINTS/REASON FOR VISIT:  Metastatic non-small cell lung cancer  ASSESSMENT & PLAN:   Cancer Staging  Primary non-small cell carcinoma of upper lobe of left lung (HCC) Staging form: Lung, AJCC 8th Edition - Clinical: Stage IV (cT2b, cN1, cM1) - Signed by [name], MD on 08/06/2021   Primary non-small cell carcinoma of upper lobe of left lung  Stage IV lung adenocarcinoma with brain and bone metastasis. Diagnosed 06/2021.  S/p lung radiation to left lung.  January 2025, CT and PET scan results were reviewed. Stable disease. No progression or new disease.  Plan to continue maintenance keytruda until unacceptable toxicity or disease progression.    Encounter for antineoplastic immunotherapy Labs reviewed and acceptable for continuation of treatment.    Intraparenchymal hemorrhage of brain  Hold off Eliquis per neurology Dr. Barbaraann Cao 10/02/22 Repeat MRI brain-stable findings Repeat MRI brain in Jan 2025-result is negative   Metastasis to bone  Previously on Zometa every 4- 6 weeks. Was held d/t decompression surgery (02/09/23) Restarted at last visit.  Experienced arthralgias Reviewed side effects including potential fevers, myalgias, arthralgias, nausea, flu like symptoms. MSK like pain can occur for days to months and may persist. Can trial oral antihistamine which may improves symptoms. Continue narcotics.  Continue calcium and vitamin d supplementation.   Seizure  On Keppra.  No additional seizure activity.  He has f/u with Dr. Barbaraann Cao next week.   Neuropathy Fingertip neuropathy intermittent Grade 1, feet neuropathy persistent, Grade 2.  On Gabapentin 300mg  TID   Recurrent UTI Wears condom catheter secondary to decreased mobility, urinary urgency, incontinence, hesitancy. Saw Dr Lonna Cobb 03/26/23.  Wife reports increased mucous and thickness of urine Check UA today.  He has f/u with Dr  Lonna Cobb next week   Frailty & pressure ulcer Decreased mobility, weight loss, and frailty Per wife patient has stage II sacral pressure ulcer.  Encouraged protein rich foods, Frequent turning. Recommend adjustable skin protection wheelchair seat cushion.  He is followed by nutrition   Disposition:  Keytruda today 3 weeks- port/lab, Dr Cathie Hoops, +/- [name]- la  No problem-specific Assessment & Plan notes found for this encounter.  All questions were answered. The patient knows to call the clinic with any problems, questions or concerns.  Consuello Masse, DNP, AGNP-C, AOCNP [facility] [phone] (clinic) 05/26/2023      HISTORY OF PRESENTING ILLNESS:  Micheal Donovan is a  73 y.o.  male presents for follow up of Non-small cell lung cancer.  Oncology History Overview Note  Diagnosis: Stage IIB T2b N1 M0 adenocarcinoma of the LUL, poorly differentiated     Primary non-small cell carcinoma of upper lobe of left lung (HCC)  06/13/2021 Initial Diagnosis   Primary non-small cell carcinoma of upper lobe of left lung [facility]) -06/20/2021 - 06/27/2021, patient presented to [facility] due to progressive headache/dizziness/gait changes. CT head showed acute to subacute intraparenchymal hemorrhage involving the left cerebellum.  Surrounding low-density vasogenic edema.  Patient was transferred to [facility]. This was further evaluated by CT angiogram of the neck which showed bulky calcified plaques/stenosis of carotid artery, Followed by MRI brain. 06/12/2021, MRI of the brain showed no significant interval change in size of the left cerebellar intraparenchymal hematoma with unchanged regional mass effect and partial effacement of fourth ventricle but no upstream hydrocephalus. There is no discernible enhancement to suggest underlying mass lesion, though acute blood products could mask enhancement.   06/12/2021 a  chest x-ray showed a 4.4 cm left middle lobe. 06/13/2021,  CT chest with contrast showed a 4.3 x 3.6 x 3.3 cm lobular spiculated mass in the posterior left upper lobe with T3 to the lateral pleura and major fissure.  Metastatic left hilar lymphadenopathy.  Peripheral micronodularity posterior right costophrenic sulcus.  Aortic atherosclerosis. 06/14/2021, CT abdomen pelvis showed right lower lobe pulmonary artery embolus.  No evidence of right heart strain.  No acute intra-abdominal or pelvic pathology.  Aortic atherosclerosis.  06/13/2021, patient underwent bronchoscopy with EBUS by Dr. Katrinka Blazing.  Biopsy from the fine-needle aspiration of station 11 mL showed malignant cells, consistent with poorly differentiated non-small cell carcinoma, consistent with adenocarcinoma.  Malignant cells are TTF-1 positive and negative for p40.  Negative for neuroendocrine markers.  Blood test and tissue sample were tested for Gardant 360  -PD-L1 TPS 97%, no actionable mutation on the blood testing. 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   07/17/2021 Cancer Staging   Staging form: Lung, AJCC 8th Edition - Clinical: Stage IV (cT2b, cN1, cM1) - Signed by [name], MD on 08/06/2021   08/06/2021 Imaging   I saw patient's PET scan after his visit with me on 08/06/21. PET scan was ordered by his previous oncologist group and did not come to my in basket.. Patient received cycle 1 carboplatin Taxol today.  He has started on radiation.  5/26/3 PET scan showed 3.8 cm hypermetabolic left upper lobe mass with hypermetabolic left hilar and infrahilar adenopathy.  There is approximately 8 scattered metastatic lesions in the skeleton. Mixed density photopenic lesion in the left cerebellum. characterized as hemorrhage on recent prior imaging workups.    08/16/2021 -  Chemotherapy   Patient is on Treatment Plan : LUNG Carboplatin (5) + Pemetrexed + Pembrolizumab (200) D1 q21d Induction x 4 cycles / Maintenance Pemetrexed + Pembrolizumab (200) D1 q21d      08/22/2021 Imaging   MRI  brain w wo contrast  Decrease in size of left cerebellar hematoma with resolution of edema. No evidence of underlying lesion. Smaller, more recent hemorrhage in the left cerebellar vermis with minimal edema. There is minimal enhancement without definite evidence of underlying lesion.  New punctate focus of chronic blood products and enhancement in the left frontal lobe. Additional new foci of chronic blood products in the posterior right putamen, right parietal subcortical white matter, and posterior right cerebellum. Unclear at this time if these represent foci of bland hemorrhage or early metastases.   Increase in size of right parietal osseous metastasis with minor extraosseous extension.     Imaging   PET scan showed 1. Interval response to therapy as evidenced by a small residual left upper lobe nodule with decreased hypermetabolism, no residual hypermetabolic adenopathy and decreased hypermetabolism associatedwith osseous metastases. 2. 6 mm posterior left upper lobe nodule, likely stable. Recommend attention on follow-up. 3. Aortic atherosclerosis (ICD10-I70.0). Coronary artery calcification.   12/17/2021 Imaging   MRI lumbar spine w wo contrast  1. No evidence of regional metastatic disease. 2. Late subacute superior endplate fracture at L2 and large superior endplate Schmorl's node at L5, unchanged since the PET scan of 10/29/2021. 3. L3-4: Shallow disc protrusion. Facet and ligamentous hypertrophy. Stenosis of both lateral recesses. Findings slightly worsened since 2022. 4. L4-5: Shallow disc protrusion. Facet and ligamentous hypertrophy. Small synovial cyst arising from the facet joint on the right. Stenosis of the lateral recesses right worse than left. Foraminal narrowing right worse than left. Findings have worsened since 2022.  5. L5-S1: Endplate osteophytes and shallow protrusion of the disc. Facet and ligamentous hypertrophy. Stenosis of the subarticular lateral recesses and  neural foramina, right worse than left. Similar appearance to the study of 2022. 6. Continued evidence of enteritis of at least 1 loop of small bowel. Distended bladder present   12/18/2021 Imaging   Brian MRI w wo  1. Interval decrease in size of the left cerebellar and vermian hematomas. No definite evidence of underlying metastatic lesion. 2. There is are two possible contrast enhancing lesions in the posterior left frontal lobe and left occipital lobe, which do not have intrinsic T1 signal abnormality, but have a somewhat linear appearance and may be vascular in nature. Recommend attention on follow up. 3. Multifocal sites of susceptibility artifact are redemonstrated with interval development of a few new foci, as described above. All of these sites demonstrate intrinsic T1 hyperintense signal abnormality and susceptibility artifact and are compatible with sites of microhemorrhages. Recommend continued attention on follow up. 4. Interval decrease in size of right parietal calvarial metastatic lesion. No new contrast enhancing lesions visualized. 5. Diffusely heterogeneous marrow signal throughout the cervical spine, which is nonspecific but can be seen in the setting of anemia, smoking, obesity, or a marrow replacement process.     12/18/2021 Imaging   MRI lumbar spine w wo contrast  1. No evidence of regional metastatic disease. 2. Late subacute superior endplate fracture at L2 and large superior endplate Schmorl's node at L5, unchanged since the PET scan of 10/29/2021 3. L3-4: Shallow disc protrusion. Facet and ligamentous hypertrophy.Stenosis of both lateral recesses. Findings slightly worsened since 2022. 4. L4-5: Shallow disc protrusion. Facet and ligamentous hypertrophy. Small synovial cyst arising from the facet joint on the right. Stenosis of the lateral recesses right worse than left. Foraminal narrowing right worse than left. Findings have worsened since 2022. 5. L5-S1: Endplate  osteophytes and shallow protrusion of the disc. Facet and ligamentous hypertrophy. Stenosis of the subarticular lateral recesses and neural foramina, right worse than left. Similar appearance to the study of 2022. 6. Continued evidence of enteritis of at least 1 loop of small bowel. Distended bladder present.       01/28/2022 Imaging   CT chest abdomen pelvis w contrast 1. Treated left upper lobe mass appears grossly stable to the prior examination when measured in a similar fashion on the prior study. No definite signs of extra skeletal metastatic disease noted in the chest, abdomen or pelvis. 2. Widespread skeletal metastases redemonstrated, as above. 3. Hepatic steatosis. 4. Aortic atherosclerosis, in addition to left main and three-vessel coronary artery disease. Assessment for potential risk factor modification, dietary therapy or pharmacologic therapy may be warranted, if clinically indicated. 5. Additional incidental findings,    04/08/2022 Imaging   MRI lumbar spine w wo contrast  1. Stable remote superior endplate fracture of L2 and large Schmorl to noted involving L5. No worrisome bone lesions to suggest metastatic disease. 2. Degenerative lumbar spondylosis with multilevel disc disease and facet disease which appears relatively stable as detailed above. 3. Enlarging right-sided synovial cyst at L4-5 with progressive mass effect on the right side of the thecal sac. This could potentially be symptomatic   04/28/2022 Imaging   CT chest abdomen pelvis wo contrast  1. Similar versus slightly decreased size of treated left upper lobe primary bronchogenic carcinoma. 2. Similar osseous metastasis. 3. No evidence of extraosseous metastatic disease. 4. New small right pleural effusion. 5. Coronary artery atherosclerosis. Aortic Atherosclerosis   07/03/2022 Imaging  PET scan showed 1. Interval development of a small focus of intense hypermetabolism within the left upper lobe scar. This  is associated with a 5 mm perifissural nodule in the left lung which is hypermetabolic. Hypermetabolism at both of these locations is new since prior PET-CT and highly suspicious for recurrent disease. 2. No evidence for hypermetabolic soft tissue metastatic disease in the neck, abdomen, or pelvis. 3. Similar appearance of sclerotic and lytic bone lesions without hypermetabolism on PET imaging. 4. Focal hypermetabolism associated with a fracture of the right L4 transverse process. The fracture was visible on the previous CT of 04/28/2022. No associated soft tissue lesion to suggest that this is pathologic in nature. Tracer accumulation on today's study is in keeping with healing fracture. There is also some minimal uptake in a prominent spur associated with the L4 superior endplate, most likely degenerative. 5.  Aortic Atherosclerosis    08/01/2022 - 08/14/2022 Radiation Therapy   Radiation to left lung.    10/01/2022 Imaging   chest angiogram PE protocol  No pulmonary embolism identified.   Stable nodular opacity in the left upper lobe. Please correlate for history of treated lung cancer. There is an adjacent small nodule along the course of the interlobar fissure which is slightly larger today compared to the study of February 2024. please correlate with findings of the recent PET-CT of 07/03/2022.      10/02/2022 Imaging   MRI brain with and without contrast 1. No acute intracranial abnormality. No evidence for intraparenchymal metastatic disease. 2. Chronic left cerebellar hemorrhage with multiple additional chronic micro hemorrhages elsewhere throughout the brain, stable. 3. Underlying mild chronic microvascular ischemic disease.    10/02/2022 Imaging   MRI lumbar spine showed 1. No MRI evidence for acute infection or other abnormality within the lumbar spine. 2. Few subcentimeter T1 hypointense lesions about the visualized posterior iliac wings. These correspond with small sclerotic  foci seen on most recent CT from 04/08/2022, presumably reflecting patient's known osseous metastatic disease. These were not hypermetabolic on prior PET-CT from 16/12/9602. 3. No other evidence for metastatic disease within the lumbar spine itself. 4. Chronic L2 compression fracture, stable. Large Schmorl's node deformity with associated height loss at L5, also stable. 5. Multifactorial degenerative changes at L3-4 through L5-S1 with resultant mild to moderate bilateral subarticular stenosis, with mild to moderate bilateral L3 through L5 foraminal narrowing as above.   12/24/2022 Imaging   CT chest w contrast   1. Stable appearance of treated lung lesion within the left upper lobe with surrounding scarring and architectural distortion. 2. The previous FDG avid subpleural nodule abutting the oblique fissure of the left lung (adjacent to post treatment changes) is again noted measuring 7 mm. This is stable from 10/01/2022. On the PET-CT from 07/03/2022 this nodule was measured at 5 mm. 3. New bandlike area of subpleural consolidation within the lateral and posterior left lower lobe is identified. This is favored to represent post treatment change. 4. Stable sclerotic lesions involving the T12 and L1 vertebra. 5. Coronary artery calcifications. 6.  Aortic Atherosclerosis    03/06/2023 Imaging   CT chest abdomen pelvis wo contrast showed  Increasing opacity along the lingula with the increasing nodular central component compared to the recent CT scan.   There is also some changing opacities in the left lower lobe. The larger more peripheral area in the left lower lobe is decreasing with some new areas of subtle ground-glass more caudal. Attention on follow-up.   No developing new other  mass lesion or nodal enlargement.   Scattered sclerotic bone lesions has a similar distribution to previous.   Overall evaluation for solid organ pathology including solid organ metastases are  limited without the advantage of IV contrast.     03/23/2023 Imaging   PET scan showed 1. Post radiation consolidation in the lingula with mild residual focal hypermetabolism, overall improved from 07/03/2022. No evidence of disease progression. 2. Quiescent osseous metastatic disease. 3. Aortic atherosclerosis (ICD10-I70.0). Coronary artery calcification.   # Patient has a history of melanoma on his neck, treated in 2009. # History of hemorrhagic infarct left cerebellum  10/01/2022 - 10/06/2022 patient was admitted to the hospital due to lower extremity weakness and intermittent aphasia. Patient reports acute on chronic left lower extremity weakness, weakness has been more prominent over the past 2 weeks.  He reports significant episode of difficulty with ambulation 2 days after his last Keytruda treatments.  He reports that he felt loss of control of muscle at that time.  Patient reports that the weakness is different than her chronic lower extremity  weakness.  Patient was found to have orthostatic hypotension Flomax irbesartan were held.  CK wnl, Neurology was consulted and started the patient on Keppra for possible seizure.  EEG was negative.  01/31/23 He recently presented to ER due to altered mental status and shaking episodes.  CT head wo negative. UA positive for nitrate and leukocyte esterase. Code sepsis was actived in ER and he was recommended for treatment of UTI.  He declined admission and went home with Keflex 500mg  4 times daily for 10 days.  Lower extremity weakness localizes best to lumbosacral nerve roots, known spondylosis with multiple foci of neuro-foraminal stenosis.  02/09/2023 S/p decompression neurosurgery   INTERVAL HISTORY MINH ROANHORSE is a 73 y.o. male who has above history of stage IV lung cancer, currently on maintenance keytruda who returns to clinic for consideration of continuation of therapy. Continues to tolerate [place] well. Zometa restarted at last visit  and he has experienced worsening of joint aches and bone pain. Weight is down. No interval seizures. Didn't sleep well last night. Wife worried about increased thickness urine. He continues to use condom catheter due to decreased mobility. Wife has also noticed skin redness and peeling of skin of sacrum. No fevers or chills. No pelvic pain or flank pain. Continues to use fentanyl with oxycodone for breakthrough pain. Prefers to minimize short acting pain medication.    Review of Systems  Constitutional:  Positive for fatigue and unexpected weight change. Negative for appetite change, chills, diaphoresis and fever.  HENT:   Negative for hearing loss, lump/mass and voice change.   Eyes:  Negative for eye problems and icterus.  Respiratory:  Positive for shortness of breath. Negative for chest tightness and cough.   Cardiovascular:  Positive for leg swelling. Negative for chest pain.  Gastrointestinal:  Negative for abdominal distention, abdominal pain, blood in stool and nausea.  Endocrine: Negative for hot flashes.  Genitourinary:  Positive for bladder incontinence. Negative for difficulty urinating, dysuria, frequency and hematuria.        Condom catheter  Musculoskeletal:  Positive for arthralgias and back pain. Negative for flank pain.       Chronic lower extremity weakness  Skin:  Negative for itching and rash.  Neurological:  Negative for extremity weakness, headaches, light-headedness and numbness.  Hematological:  Negative for adenopathy. Does not bruise/bleed easily.  Psychiatric/Behavioral:  Positive for sleep disturbance. Negative for confusion.  MEDICAL HISTORY:  Past Medical History:  Diagnosis Date   Allergy    Anxiety    BPH (benign prostatic hypertrophy)    Cancer (HCC)    Melanoma on Neck    2008   Carotid artery occlusion    CHF (congestive heart failure) (HCC)    Diabetes mellitus    type 2   ED (erectile dysfunction)    GERD (gastroesophageal reflux disease)     Hemorrhagic stroke (HCC) 06/2021   Hyperlipidemia    Hypertension    Lung cancer (HCC)    Neoplasm related pain    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)    Stroke [facility])     SURGICAL HISTORY: Past Surgical History:  Procedure Laterality Date   ANTERIOR CERVICAL DECOMP/DISCECTOMY FUSION N/A 02/09/2023   Procedure: C3-5 ANTERIOR CERVICAL DISCECTOMY AND FUSION;  Surgeon: Venetia Night, MD;  Location: [facility] ORS;  Service: Neurosurgery;  Laterality: N/A;   BRONCHIAL NEEDLE ASPIRATION BIOPSY  06/17/2021   Procedure: BRONCHIAL NEEDLE ASPIRATION BIOPSIES;  Surgeon: Lorin Glass, MD;  Location: [facility] ENDOSCOPY;  Service: Pulmonary;;   IR IMAGING GUIDED PORT INSERTION  08/05/2021   MELANOMA EXCISION  2008   Left side of neck   RADIOLOGY WITH ANESTHESIA N/A 04/05/2020   Procedure: MRI SPINE WITOUT CONTRAST;  Surgeon: Radiologist, Medication, MD;  Location: [facility] OR;  Service: Radiology;  Laterality: N/A;   RADIOLOGY WITH ANESTHESIA N/A 08/22/2021   Procedure: MRI BRAIN WITH AND WITHOUT CONTRAST  WITH ANESTHESIA;  Surgeon: Radiologist, Medication, MD;  Location: [facility] OR;  Service: Radiology;  Laterality: N/A;   RADIOLOGY WITH ANESTHESIA N/A 12/17/2021   Procedure: MRI BRAIN WITH AND WITHOUT CONTRAST WITH ANESTHESIA; MRI LUMBER WITH AND WITHOUT CONTRAST;  Surgeon: Radiologist, Medication, MD;  Location: [facility] OR;  Service: Radiology;  Laterality: N/A;   RADIOLOGY WITH ANESTHESIA N/A 04/08/2022   Procedure: MRI LUMBER SPINE WITH AND WITHOUT CONTRAST;  Surgeon: Radiologist, Medication, MD;  Location: [facility] OR;  Service: Radiology;  Laterality: N/A;   TONSILLECTOMY     VIDEO BRONCHOSCOPY WITH ENDOBRONCHIAL ULTRASOUND N/A 06/17/2021   Procedure: VIDEO BRONCHOSCOPY WITH ENDOBRONCHIAL ULTRASOUND;  Surgeon: Lorin Glass, MD;  Location: [facility] ENDOSCOPY;  Service: Pulmonary;  Laterality: N/A;    SOCIAL HISTORY: Social History   Socioeconomic History   Marital status: Married    Spouse name: Not on file   Number of  children: Not on file   Years of education: Not on file   Highest education level: Not on file  Occupational History   Not on file  Tobacco Use   Smoking status: Former    Current packs/day: 0.00    Types: Cigarettes    Quit date: 07/21/1990    Years since quitting: 32.8   Smokeless tobacco: Never  Vaping Use   Vaping status: Never Used  Substance and Sexual Activity   Alcohol use: No   Drug use: No   Sexual activity: Not Currently  Other Topics Concern   Not on file  Social History Narrative   Not on file   Social Drivers of Health   Financial Resource Strain: Low Risk  (04/17/2022)   Overall Financial Resource Strain (CARDIA)    Difficulty of Paying Living Expenses: Not hard at all  Food Insecurity: No Food Insecurity (04/22/2023)   Hunger Vital Sign    Worried About Running Out of Food in the Last Year: Never true    Ran Out of Food in the Last Year: Never true  Transportation Needs: No  Transportation Needs (04/22/2023)   PRAPARE - Administrator, Civil Service (Medical): No    Lack of Transportation (Non-Medical): No  Physical Activity: Inactive (03/27/2022)   Exercise Vital Sign    Days of Exercise per Week: 0 days    Minutes of Exercise per Session: 0 min  Stress: Stress Concern Present (03/27/2022)   [employer] of Occupational Health - Occupational Stress Questionnaire    Feeling of Stress : To some extent  Social Connections: Patient Unable To Answer (04/19/2023)   Social Connection and Isolation Panel [NHANES]    Frequency of Communication with Friends and Family: Patient unable to answer    Frequency of Social Gatherings with Friends and Family: Patient unable to answer    Attends Religious Services: Patient unable to answer    Active Member of Clubs or Organizations: Patient unable to answer    Attends Banker Meetings: Patient unable to answer    Marital Status: Patient unable to answer  Intimate Partner Violence: Not At Risk  (04/22/2023)   Humiliation, Afraid, Rape, and Kick questionnaire    Fear of Current or Ex-Partner: No    Emotionally Abused: No    Physically Abused: No    Sexually Abused: No    FAMILY HISTORY: Family History  Problem Relation Age of Onset   COPD Mother    Heart disease Father    Heart disease Brother        MI at age 80   Stroke Neg Hx     ALLERGIES:  is allergic to niaspan [niacin].  MEDICATIONS:  Current Outpatient Medications  Medication Sig Dispense Refill   ALPRAZolam (XANAX) 0.5 MG tablet Take 1 tablet (0.5 mg total) by mouth at bedtime as needed for anxiety. 15 tablet 0   celecoxib (CELEBREX) 200 MG capsule Take 1 capsule (200 mg total) by mouth 2 (two) times daily. 60 capsule 0   citalopram (CELEXA) 10 MG tablet Take 1 tablet (10 mg total) by mouth daily. 30 tablet 3   docusate sodium (COLACE) 100 MG capsule Take 100 mg by mouth 2 (two) times daily as needed for mild constipation.     fentaNYL (DURAGESIC) 75 MCG/HR Place 1 patch onto the skin every 3 (three) days. 10 patch 0   gabapentin (NEURONTIN) 300 MG capsule Take 2 capsules (600 mg total) by mouth 2 (two) times daily. 120 capsule 2   levETIRAcetam (KEPPRA) 500 MG tablet Take 1 tablet (500 mg total) by mouth 2 (two) times daily. 180 tablet 3   methocarbamol (ROBAXIN) 500 MG tablet Take 1 tablet (500 mg total) by mouth every 6 (six) hours as needed for muscle spasms. 120 tablet 0   NOVOLOG FLEXPEN 100 UNIT/ML FlexPen Inject 0-10 Units into the skin daily as needed for high blood sugar (BG > 200).     oxyCODONE-acetaminophen (PERCOCET) 7.5-325 MG tablet Take 1 tablet by mouth every 4 (four) hours as needed for severe pain (pain score 7-10). Do not take with cough medication or other pain medication 60 tablet 0   tamsulosin (FLOMAX) 0.4 MG CAPS capsule Take 1 capsule (0.4 mg total) by mouth daily. 30 capsule 2   insulin glargine (LANTUS) 100 UNIT/ML injection Inject 0-30 Units into the skin daily as needed (High Blood  Sugar over 150). (Patient not taking: Reported on 05/15/2023)     Insulin Pen Needle (B-D UF III MINI PEN NEEDLES) 31G X 5 MM MISC Use to inject insulin daily. (Patient not taking: Reported on 05/15/2023)  100 each 3   megestrol (MEGACE) 40 MG tablet Take 1 tablet (40 mg total) by mouth daily. (Patient not taking: Reported on 05/15/2023) 30 tablet 3   Nystatin (GERHARDT'S BUTT CREAM) CREA Apply 14 Applications topically 2 (two) times daily. (Patient not taking: Reported on 05/15/2023) 1 each 0   omeprazole (PRILOSEC) 20 MG capsule Take 20 mg by mouth daily. (Patient not taking: Reported on 05/15/2023)     senna (SENOKOT) 8.6 MG TABS tablet Take 1 tablet (8.6 mg total) by mouth 2 (two) times daily as needed for mild constipation. (Patient not taking: Reported on 05/15/2023) 30 tablet 0   No current facility-administered medications for this visit.   Facility-Administered Medications Ordered in Other Visits  Medication Dose Route Frequency Provider Last Rate Last Admin   heparin lock flush 100 UNIT/ML injection              PHYSICAL EXAMINATION: Vitals:   05/26/23 1315  BP: 113/75  Pulse: 89  Resp: 18  Temp: (!) 96.8 F (36 C)  SpO2: 96%   Filed Weights   05/26/23 1315  Weight: 124 lb 8 oz (56.5 kg)   Physical Exam Constitutional:      General: He is not in acute distress.    Appearance: He is ill-appearing.     Comments: More frail appearing compared to previous exams. Weight loss.   HENT:     Head: Normocephalic and atraumatic.  Eyes:     General: No scleral icterus. Cardiovascular:     Rate and Rhythm: Normal rate and regular rhythm.  Pulmonary:     Effort: Pulmonary effort is normal. No respiratory distress.     Breath sounds: No wheezing.     Comments: Decreased breath sound bilaterally. Abdominal:     General: There is no distension.     Palpations: Abdomen is soft.     Tenderness: There is no abdominal tenderness. There is no right CVA tenderness or left CVA tenderness.   Musculoskeletal:        General: No deformity.     Right lower leg: No edema.     Left lower leg: No edema.     Comments: Bilateral ankle trace edema.  Skin:    General: Skin is warm and dry.     Findings: No rash.     Comments: Sacrum not assessed  Neurological:     Mental Status: He is alert. Mental status is at baseline.     Cranial Nerves: No cranial nerve deficit.     Comments: Chronic left-sided weakness  Psychiatric:        Mood and Affect: Mood normal.        Behavior: Behavior normal.     LABORATORY DATA:  I have reviewed the data as listed    Latest Ref Rng & Units 05/26/2023   12:55 PM 05/05/2023   12:56 PM 04/21/2023    5:57 AM  CBC  WBC 4.0 - 10.5 K/uL 6.5  8.3  4.7   Hemoglobin 13.0 - 17.0 g/dL 16.1  09.6  04.5   Hematocrit 39.0 - 52.0 % 38.4  38.6  33.2   Platelets 150 - 400 K/uL 302  239  227       Latest Ref Rng & Units 05/26/2023   12:55 PM 05/05/2023   12:56 PM 04/21/2023    5:57 AM  CMP  Glucose 70 - 99 mg/dL 409  811  914   BUN 8 - 23 mg/dL 16  23  19   Creatinine 0.61 - 1.24 mg/dL 0.98  1.19  1.47   Sodium 135 - 145 mmol/L 136  134  135   Potassium 3.5 - 5.1 mmol/L 3.9  4.3  3.7   Chloride 98 - 111 mmol/L 101  99  105   CO2 22 - 32 mmol/L 24  26  22    Calcium 8.9 - 10.3 mg/dL 8.8  8.8  8.7   Total Protein 6.5 - 8.1 g/dL 6.9  6.6    Total Bilirubin 0.0 - 1.2 mg/dL 0.8  0.5    Alkaline Phos 38 - 126 U/L 61  63    AST 15 - 41 U/L 20  17    ALT 0 - 44 U/L 11  9     Lab Results  Component Value Date   TSH 1.709 03/31/2023   Iron/TIBC/Ferritin/ %Sat    Component Value Date/Time   IRON 60 02/03/2022 0958   TIBC 391 02/03/2022 0958   FERRITIN 225 04/19/2023 0648   IRONPCTSAT 15 (L) 02/03/2022 0958      RADIOGRAPHIC STUDIES: I have personally reviewed the radiological images as listed and agreed with the findings in the report. DG Cervical Spine 2 or 3 views Result Date: 05/19/2023 CLINICAL DATA:  Status post fusion December 2024 cervical  myelopathy without pain EXAM: CERVICAL SPINE - 2-3 VIEW COMPARISON:  March 24, 2023 FINDINGS: Status post ACDF C3-C4 C4-C5 and intervertebral disc replacement C3-C4 C4-C5. Near anatomic alignment. No significant change from prior examination There is C6-C7 disc space with anterior osteophytic change Spinolaminar line intact Two articulation and predental space normal Prevertebral soft tissues are normal.  No IMPRESSION: Status post ACDF C3-C4 C4-C5 and intervertebral disc replacement C3-C4 C4-C5. Near anatomic alignment. No significant change from prior examination. Electronically Signed   By: Shaaron Adler M.D.   On: 05/19/2023 20:24   VAS [place] ABI WITH/WO TBI Result Date: 05/13/2023  LOWER EXTREMITY DOPPLER STUDY Patient Name:  KEANDRE LINDEN  Date of Exam:   05/13/2023 Medical Rec #: [id number]      Accession #:    [id number] Date of Birth: [birth year]      Patient Gender: M Patient Age:   42 years Exam Location:  [facility] Procedure:      VAS [place] ABI WITH/WO TBI Referring Phys: Lynnea Ferrier --------------------------------------------------------------------------------  Indications: Peripheral artery disease. High Risk Factors: Hypertension, hyperlipidemia, Diabetes.  Performing Technologist: Criss Rosales RVT  Examination Guidelines: A complete evaluation includes at minimum, Doppler waveform signals and systolic blood pressure reading at the level of bilateral brachial, anterior tibial, and posterior tibial arteries, when vessel segments are accessible. Bilateral testing is considered an integral part of a complete examination. Photoelectric Plethysmograph (PPG) waveforms and toe systolic pressure readings are included as required and additional duplex testing as needed. Limited examinations for reoccurring indications may be performed as noted.  ABI Findings: +---------+------------------+-----+--------+ Right    Rt Pressure (mmHg)IndexWaveform  +---------+------------------+-----+--------+ Brachial 116                             +---------+------------------+-----+--------+ PTA                             absent   +---------+------------------+-----+--------+ DP       78                0.67 biphasic +---------+------------------+-----+--------+ Oda Cogan  0.00 Absent   +---------+------------------+-----+--------+ +---------+------------------+-----+----------+ Left     Lt Pressure (mmHg)IndexWaveform   +---------+------------------+-----+----------+ Brachial 113                               +---------+------------------+-----+----------+ PTA      77                0.66 monophasic +---------+------------------+-----+----------+ DP       89                0.77 monophasic +---------+------------------+-----+----------+ Great Toe18                0.16 Abnormal   +---------+------------------+-----+----------+ +-------+-----------+-----------+------------+------------+ ABI/TBIToday's ABIToday's TBIPrevious ABIPrevious TBI +-------+-----------+-----------+------------+------------+ Right  0.67       0                                   +-------+-----------+-----------+------------+------------+ Left   0.77       0.16                                +-------+-----------+-----------+------------+------------+  Summary: Right: Resting right ankle-brachial index indicates moderate right lower extremity arterial disease. The right toe-brachial index is abnormal/absent. Right PTA absent.  Left: Resting left ankle-brachial index indicates moderate left lower extremity arterial disease. The left toe-brachial index is abnormal. *See table(s) above for measurements and observations.  Electronically signed by Lemar Livings MD on 05/13/2023 at 5:20:56 PM.    Final    DG PAIN CLINIC C-ARM 1-60 MIN NO REPORT Result Date: 05/12/2023 Fluoro was used, but no Radiologist interpretation will be  provided. Please refer to "NOTES" tab for provider progress note.

## 2023-05-26 NOTE — Progress Notes (Signed)
 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  Patient receiving [place].    Met with patient during infusion.  Reports that his appetite is pretty good.  Usually has a ham or chicken biscuit for breakfast. Snacks during the day on nabs, candy bars.  Has evening meal of spaghetti or hamburgers or pizza.  Does not like the oral nutrition supplement shakes.     Medications: reviewed  Labs: reviewed  Anthropometrics:   Weight 124 lb 8 oz 3/25  123 lb on 9/30 120 lb 14.4 oz on 8/19 132 lb on 7/31 133 lb on 09/18/22   NUTRITION DIAGNOSIS: Inadequate oral intake stable    INTERVENTION:  Continue high calorie, high protein foods     MONITORING, EVALUATION, GOAL: weight trends, intake   NEXT VISIT: as needed  Micheal Donovan B. Elease Hashimoto, CSO, LDN Registered Dietitian [phone]

## 2023-05-26 NOTE — Patient Instructions (Signed)
 [facility] - A DEPT OF [facility]  Discharge Instructions: Thank you for choosing [facility] to provide your oncology and hematology care.  If you have a lab appointment with the Cancer Center, please go directly to the Cancer Center and check in at the registration area.  Wear comfortable clothing and clothing appropriate for easy access to any Portacath or PICC line.   We strive to give you quality time with your provider. You may need to reschedule your appointment if you arrive late (15 or more minutes).  Arriving late affects you and other patients whose appointments are after yours.  Also, if you miss three or more appointments without notifying the office, you may be dismissed from the clinic at the provider's discretion.      For prescription refill requests, have your pharmacy contact our office and allow 72 hours for refills to be completed.    Today you received the following chemotherapy and/or immunotherapy agents KEYTRUDA      To help prevent nausea and vomiting after your treatment, we encourage you to take your nausea medication as directed.  BELOW ARE SYMPTOMS THAT SHOULD BE REPORTED IMMEDIATELY: *FEVER GREATER THAN 100.4 F (38 C) OR HIGHER *CHILLS OR SWEATING *NAUSEA AND VOMITING THAT IS NOT CONTROLLED WITH YOUR NAUSEA MEDICATION *UNUSUAL SHORTNESS OF BREATH *UNUSUAL BRUISING OR BLEEDING *URINARY PROBLEMS (pain or burning when urinating, or frequent urination) *BOWEL PROBLEMS (unusual diarrhea, constipation, pain near the anus) TENDERNESS IN MOUTH AND THROAT WITH OR WITHOUT PRESENCE OF ULCERS (sore throat, sores in mouth, or a toothache) UNUSUAL RASH, SWELLING OR PAIN  UNUSUAL VAGINAL DISCHARGE OR ITCHING   Items with * indicate a potential emergency and should be followed up as soon as possible or go to the Emergency Department if any problems should occur.  Please show the CHEMOTHERAPY ALERT CARD or IMMUNOTHERAPY  ALERT CARD at check-in to the Emergency Department and triage nurse.  Should you have questions after your visit or need to cancel or reschedule your appointment, please contact [facility] - A DEPT OF [facility]  [phone] and follow the prompts.  Office hours are 8:00 a.m. to 4:30 p.m. Monday - Friday. Please note that voicemails left after 4:00 p.m. may not be returned until the following business day.  We are closed weekends and major holidays. You have access to a nurse at all times for urgent questions. Please call the main number to the clinic [phone] and follow the prompts.  For any non-urgent questions, you may also contact your provider using MyChart. We now offer e-Visits for anyone 71 and older to request care online for non-urgent symptoms. For details visit [email or web address].   Also download the MyChart app! Go to the app store, search "MyChart", open the app, select [place], and log in with your MyChart username and password.   Pembrolizumab Injection What is this medication? PEMBROLIZUMAB (PEM broe LIZ ue mab) treats some types of cancer. It works by helping your immune system slow or stop the spread of cancer cells. It is a monoclonal antibody. This medicine may be used for other purposes; ask your health care provider or pharmacist if you have questions. COMMON BRAND NAME(S): Keytruda What should I tell my care team before I take this medication? They need to know if you have any of these conditions: Allogeneic stem cell transplant (uses someone else's stem cells) Autoimmune diseases, such as Crohn  disease, ulcerative colitis, lupus History of chest radiation Nervous system problems, such as Guillain-Barre syndrome, myasthenia gravis Organ transplant An unusual or allergic reaction to pembrolizumab, other medications, foods, dyes, or preservatives Pregnant or trying to get pregnant Breast-feeding How should I use this  medication? This medication is injected into a vein. It is given by your care team in a hospital or clinic setting. A special MedGuide will be given to you before each treatment. Be sure to read this information carefully each time. Talk to your care team about the use of this medication in children. While it may be prescribed for children as young as 6 months for selected conditions, precautions do apply. Overdosage: If you think you have taken too much of this medicine contact a poison control center or emergency room at once. NOTE: This medicine is only for you. Do not share this medicine with others. What if I miss a dose? Keep appointments for follow-up doses. It is important not to miss your dose. Call your care team if you are unable to keep an appointment. What may interact with this medication? Interactions have not been studied. This list may not describe all possible interactions. Give your health care provider a list of all the medicines, herbs, non-prescription drugs, or dietary supplements you use. Also tell them if you smoke, drink alcohol, or use illegal drugs. Some items may interact with your medicine. What should I watch for while using this medication? Your condition will be monitored carefully while you are receiving this medication. You may need blood work while taking this medication. This medication may cause serious skin reactions. They can happen weeks to months after starting the medication. Contact your care team right away if you notice fevers or flu-like symptoms with a rash. The rash may be red or purple and then turn into blisters or peeling of the skin. You may also notice a red rash with swelling of the face, lips, or lymph nodes in your neck or under your arms. Tell your care team right away if you have any change in your eyesight. Talk to your care team if you may be pregnant. Serious birth defects can occur if you take this medication during pregnancy and for 4  months after the last dose. You will need a negative pregnancy test before starting this medication. Contraception is recommended while taking this medication and for 4 months after the last dose. Your care team can help you find the option that works for you. Do not breastfeed while taking this medication and for 4 months after the last dose. What side effects may I notice from receiving this medication? Side effects that you should report to your care team as soon as possible: Allergic reactions--skin rash, itching, hives, swelling of the face, lips, tongue, or throat Dry cough, shortness of breath or trouble breathing Eye pain, redness, irritation, or discharge with blurry or decreased vision Heart muscle inflammation--unusual weakness or fatigue, shortness of breath, chest pain, fast or irregular heartbeat, dizziness, swelling of the ankles, feet, or hands Hormone gland problems--headache, sensitivity to light, unusual weakness or fatigue, dizziness, fast or irregular heartbeat, increased sensitivity to cold or heat, excessive sweating, constipation, hair loss, increased thirst or amount of urine, tremors or shaking, irritability Infusion reactions--chest pain, shortness of breath or trouble breathing, feeling faint or lightheaded Kidney injury (glomerulonephritis)--decrease in the amount of urine, red or dark brown urine, foamy or bubbly urine, swelling of the ankles, hands, or feet Liver injury--right upper belly pain,  loss of appetite, nausea, light-colored stool, dark yellow or brown urine, yellowing skin or eyes, unusual weakness or fatigue Pain, tingling, or numbness in the hands or feet, muscle weakness, change in vision, confusion or trouble speaking, loss of balance or coordination, trouble walking, seizures Rash, fever, and swollen lymph nodes Redness, blistering, peeling, or loosening of the skin, including inside the mouth Sudden or severe stomach pain, bloody diarrhea, fever, nausea,  vomiting Side effects that usually do not require medical attention (report to your care team if they continue or are bothersome): Bone, joint, or muscle pain Diarrhea Fatigue Loss of appetite Nausea Skin rash This list may not describe all possible side effects. Call your doctor for medical advice about side effects. You may report side effects to FDA at 1-800-FDA-1088. Where should I keep my medication? This medication is given in a hospital or clinic. It will not be stored at home. NOTE: This sheet is a summary. It may not cover all possible information. If you have questions about this medicine, talk to your doctor, pharmacist, or health care provider.  2024 Elsevier/Gold Standard (2021-07-02 00:00:00)

## 2023-05-26 NOTE — Telephone Encounter (Addendum)
 06/01/23--Message received from  [facility] with [employer]: At this time our system is experiencing technical difficulties. Once this is resolved we can proceed with order. Thanks!   05/26/23--Order placed for Adjustable Skin Protection Wheelchair Seat Cushion to [facility] via [employer] portal.

## 2023-05-27 ENCOUNTER — Other Ambulatory Visit: Payer: Self-pay

## 2023-05-27 ENCOUNTER — Ambulatory Visit: Admitting: Family Medicine

## 2023-05-27 ENCOUNTER — Ambulatory Visit: Payer: Self-pay

## 2023-05-27 ENCOUNTER — Telehealth: Payer: Self-pay

## 2023-05-27 VITALS — BP 114/62 | HR 75 | Temp 98.2°F | Ht 67.0 in | Wt 124.5 lb

## 2023-05-27 DIAGNOSIS — I619 Nontraumatic intracerebral hemorrhage, unspecified: Secondary | ICD-10-CM | POA: Diagnosis not present

## 2023-05-27 DIAGNOSIS — M79605 Pain in left leg: Secondary | ICD-10-CM | POA: Diagnosis not present

## 2023-05-27 DIAGNOSIS — N390 Urinary tract infection, site not specified: Secondary | ICD-10-CM

## 2023-05-27 DIAGNOSIS — M79604 Pain in right leg: Secondary | ICD-10-CM | POA: Diagnosis not present

## 2023-05-27 DIAGNOSIS — G629 Polyneuropathy, unspecified: Secondary | ICD-10-CM | POA: Diagnosis not present

## 2023-05-27 DIAGNOSIS — C3412 Malignant neoplasm of upper lobe, left bronchus or lung: Secondary | ICD-10-CM

## 2023-05-27 DIAGNOSIS — Z5112 Encounter for antineoplastic immunotherapy: Secondary | ICD-10-CM | POA: Diagnosis not present

## 2023-05-27 LAB — URINALYSIS, COMPLETE (UACMP) WITH MICROSCOPIC
Bilirubin Urine: NEGATIVE
Glucose, UA: NEGATIVE mg/dL
Ketones, ur: NEGATIVE mg/dL
Nitrite: POSITIVE — AB
Protein, ur: NEGATIVE mg/dL
Specific Gravity, Urine: 1.014 (ref 1.005–1.030)
WBC, UA: >50 WBC/hpf (ref 0–5)
pH: 6 (ref 5.0–8.0)

## 2023-05-27 NOTE — Telephone Encounter (Signed)
 Copied from CRM [id number]. Topic: Clinical - Red Word Triage >> May 27, 2023 10:37 AM Pierre Bali B wrote: [facility] that prompted transfer to Nurse Triage: 10/10

## 2023-05-27 NOTE — Telephone Encounter (Signed)
 Tried to call pt to advise that venous dopplers have to be scheduled. No answer and voice mail is full. Mjp,lpn   Copied from CRM [id number]. Topic: Clinical - Request for Lab/Test Order >> May 27, 2023  4:00 PM Nyra Capes wrote: Reason for CRM: patient called in. [facility] Dr Tanya Nones sent patient the to get an [place] of lower leg veins. Imaging center informed patient they  need an order and to set up an appt.  Patient would like a phone call regarding the appt. Patients phone [phone] ok to leave a detailed message.

## 2023-05-27 NOTE — Telephone Encounter (Signed)
  Chief Complaint: leg pain Symptoms: bilateral leg pain, back pain, weakness in legs when in pain Frequency: worse over "the past few weeks" Pertinent Negatives: Patient denies swelling, rash Disposition: [] ED /[] Urgent Care (no appt availability in office) / [] Appointment(In office/virtual)/ []  [place] [facility]/ [] Home Care/ [] Refused Recommended Disposition /[] [place] [facility]/ []  Follow-up with PCP Additional Notes: Patient reports he has new onset bilateral leg pain over the last few weeks that has been getting increasingly worse. Patient reports as the day goes on his pain becomes a 10/10, but states right now it is mild due to still being in bed. Patient reports weakness in his legs and back pain. Per protocol, appt scheduled in office today 3/26. Patient advised to call back with worsening symptoms. Patient verbalized understanding.     Copied from CRM [id number]. Topic: Clinical - Red Word Triage >> May 27, 2023 10:42 AM Pierre Bali B wrote: [facility] that prompted transfer to Nurse Triage: 10/10 pain Reason for Disposition  [1] MILD pain (e.g., does not interfere with normal activities) AND [2] present > 7 days  Answer Assessment - Initial Assessment Questions 1. ONSET: "When did the pain start?"      A couple weeks, worsening 2. LOCATION: "Where is the pain located?"      Both legs 3. PAIN: "How bad is the pain?"    (Scale 1-10; or mild, moderate, severe)   -  MILD (1-3): doesn't interfere with normal activities    -  MODERATE (4-7): interferes with normal activities (e.g., work or school) or awakens from sleep, limping    -  SEVERE (8-10): excruciating pain, unable to do any normal activities, unable to walk     Severe @ night, but 0/10 now 4. WORK OR EXERCISE: "Has there been any recent work or exercise that involved this part of the body?"      none 5. CAUSE: "What do you think is causing the leg pain?"     unsure 6. OTHER SYMPTOMS: "Do you have any other symptoms?"  (e.g., chest pain, back pain, breathing difficulty, swelling, rash, fever, numbness, weakness)     Back pain, weakness in legs  Protocols used: Leg Pain-A-AH

## 2023-05-27 NOTE — Progress Notes (Signed)
 Patient Office Visit  Assessment & Plan:  Bilateral leg pain -     VAS [place] LOWER EXTREMITY VENOUS (DVT); Future  Intraparenchymal hemorrhage of brain (HCC)  Primary non-small cell carcinoma of upper lobe of left lung (HCC)  Neuropathy   Bilateral Venous Doppler ordered at [facility] today.  Patient is aware that arterial Dopplers had been previously ordered but he has not done those.  Patient likely does have peripheral vascular disease but at this point we want to make sure he does not have bilateral DVTs due to cancer and immobility.  If Venous Dopplers are negative  he needs to do arterial Dopplers. May want to consider increasing the Gabapentin dosage to help with neuropathy.  Return if symptoms worsen or fail to improve.   Subjective:    Patient ID: Micheal Donovan, male    DOB: Jul 26, 1950  Age: 73 y.o. MRN: [id number]  Chief Complaint  Patient presents with   restless legs    Has been going on for awhile per pt. He states that it disrupts his sleep.    HPI Bilateral leg pain getting worse the past few weeks. Pt has no previous hx of DVT or PE but FH clotting disorder in mom.  Pt is off Eliquis over a year now (not sure if due to cerebral bleed). Pt is sedentary for several months and has stage IV lung cancer.  Patient pretty much sits all day or stays on the couch.  Patient has not been walking.. Pt lives with wife (who got the flu recently). 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  Did see the oncologist yesterday but did not think to mention the leg pain.  Patient has been taking gabapentin 600 twice a day for the neuropathy but is not helping too much.  Patient states that the leg pain is mostly over the calves and not the feet.  Patient does have a little blood blister on the right great toe that he noticed yesterday.  No skin breakdown.  No previous history of ulcers.  Patient not having shortness of breath or any chest pain. Arterial Dopplers were previously ordered but patient has not  done this.  Patient is a former smoker.  The ASCVD Risk score (Arnett DK, et al., 2019) failed to calculate for the following reasons:   Risk score cannot be calculated because patient has a medical history suggesting prior/existing ASCVD  Past Medical History:  Diagnosis Date   Allergy    Anxiety    BPH (benign prostatic hypertrophy)    Cancer (HCC)    Melanoma on Neck    2008   Carotid artery occlusion    CHF (congestive heart failure) (HCC)    Diabetes mellitus    type 2   ED (erectile dysfunction)    GERD (gastroesophageal reflux disease)    Hemorrhagic stroke (HCC) 06/2021   Hyperlipidemia    Hypertension    Lung cancer (HCC)    Neoplasm related pain    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)    Stroke [facility])    Past Surgical History:  Procedure Laterality Date   ANTERIOR CERVICAL DECOMP/DISCECTOMY FUSION N/A 02/09/2023   Procedure: C3-5 ANTERIOR CERVICAL DISCECTOMY AND FUSION;  Surgeon: Venetia Night, MD;  Location: [facility] ORS;  Service: Neurosurgery;  Laterality: N/A;   BRONCHIAL NEEDLE ASPIRATION BIOPSY  06/17/2021   Procedure: BRONCHIAL NEEDLE ASPIRATION BIOPSIES;  Surgeon: Lorin Glass, MD;  Location: [facility] ENDOSCOPY;  Service: Pulmonary;;   IR IMAGING GUIDED  PORT INSERTION  08/05/2021   MELANOMA EXCISION  2008   Left side of neck   RADIOLOGY WITH ANESTHESIA N/A 04/05/2020   Procedure: MRI SPINE WITOUT CONTRAST;  Surgeon: Radiologist, Medication, MD;  Location: [facility] OR;  Service: Radiology;  Laterality: N/A;   RADIOLOGY WITH ANESTHESIA N/A 08/22/2021   Procedure: MRI BRAIN WITH AND WITHOUT CONTRAST  WITH ANESTHESIA;  Surgeon: Radiologist, Medication, MD;  Location: [facility] OR;  Service: Radiology;  Laterality: N/A;   RADIOLOGY WITH ANESTHESIA N/A 12/17/2021   Procedure: MRI BRAIN WITH AND WITHOUT CONTRAST WITH ANESTHESIA; MRI LUMBER WITH AND WITHOUT CONTRAST;  Surgeon: Radiologist, Medication, MD;  Location: [facility] OR;  Service: Radiology;  Laterality: N/A;   RADIOLOGY WITH  ANESTHESIA N/A 04/08/2022   Procedure: MRI LUMBER SPINE WITH AND WITHOUT CONTRAST;  Surgeon: Radiologist, Medication, MD;  Location: [facility] OR;  Service: Radiology;  Laterality: N/A;   TONSILLECTOMY     VIDEO BRONCHOSCOPY WITH ENDOBRONCHIAL ULTRASOUND N/A 06/17/2021   Procedure: VIDEO BRONCHOSCOPY WITH ENDOBRONCHIAL ULTRASOUND;  Surgeon: Lorin Glass, MD;  Location: [facility] ENDOSCOPY;  Service: Pulmonary;  Laterality: N/A;   Social History   Tobacco Use   Smoking status: Former    Current packs/day: 0.00    Types: Cigarettes    Quit date: 07/21/1990    Years since quitting: 32.8   Smokeless tobacco: Never  Vaping Use   Vaping status: Never Used  Substance Use Topics   Alcohol use: No   Drug use: No   Family History  Problem Relation Age of Onset   COPD Mother    Clotting disorder Mother    Heart disease Father    Heart disease Brother        MI at age 36   Clotting disorder Maternal Grandmother    Stroke Neg Hx    Allergies  Allergen Reactions   Niaspan [Niacin]     FLUSHING    ROS    Objective:    BP 114/62   Pulse 75   Temp 98.2 F (36.8 C)   Ht 5\' 7"  (1.702 m)   Wt 124 lb 8 oz (56.5 kg)   SpO2 98%   BMI 19.50 kg/m  BP Readings from Last 3 Encounters:  05/27/23 114/62  05/26/23 113/75  05/15/23 (!) 107/58   Wt Readings from Last 3 Encounters:  05/27/23 124 lb 8 oz (56.5 kg)  05/26/23 124 lb 8 oz (56.5 kg)  05/15/23 132 lb (59.9 kg)    Physical Exam Vitals and nursing note reviewed.  Constitutional:      Appearance: Normal appearance.     Comments: In [place], comes in with assistant/CNA  HENT:     Head: Normocephalic.     Right Ear: Tympanic membrane, ear canal and external ear normal.     Left Ear: Tympanic membrane, ear canal and external ear normal.  Eyes:     Extraocular Movements: Extraocular movements intact.     Conjunctiva/sclera: Conjunctivae normal.     Pupils: Pupils are equal, round, and reactive to light.  Cardiovascular:     Rate and  Rhythm: Normal rate and regular rhythm.     Heart sounds: Normal heart sounds.  Pulmonary:     Effort: Pulmonary effort is normal.     Breath sounds: Normal breath sounds.  Musculoskeletal:     Right lower leg: No edema.     Left lower leg: No edema.  Neurological:     General: No focal deficit present.     Mental Status:  He is alert and oriented to person, place, and time.     Sensory: No sensory deficit.     Comments: Pt is in wheelchair. Pt has right great toe small blood blister, no skin breakdown. No tenderness.  Pt has faint pulses Dorsalis pedis bilaterally. No hair noted. No calf tenderness bilaterally with palpation.   Psychiatric:        Mood and Affect: Mood normal.        Behavior: Behavior normal.        Judgment: Judgment normal.      Results for orders placed or performed in visit on 05/27/23  Urinalysis, Complete w Microscopic  Result Value Ref Range   Color, Urine YELLOW (A) YELLOW   APPearance CLOUDY (A) CLEAR   Specific Gravity, Urine 1.014 1.005 - 1.030   pH 6.0 5.0 - 8.0   Glucose, UA NEGATIVE NEGATIVE mg/dL   Hgb urine dipstick SMALL (A) NEGATIVE   Bilirubin Urine NEGATIVE NEGATIVE   Ketones, ur NEGATIVE NEGATIVE mg/dL   Protein, ur NEGATIVE NEGATIVE mg/dL   Nitrite POSITIVE (A) NEGATIVE   Leukocytes,Ua MODERATE (A) NEGATIVE   RBC / HPF 21-50 0 - 5 RBC/hpf   WBC, UA >50 0 - 5 WBC/hpf   Bacteria, UA RARE (A) NONE SEEN   Squamous Epithelial / HPF 6-10 0 - 5 /HPF   WBC Clumps PRESENT    Mucus PRESENT

## 2023-05-27 NOTE — Telephone Encounter (Signed)
 See nurse triage encounter.

## 2023-05-28 ENCOUNTER — Encounter: Payer: Self-pay | Admitting: Nurse Practitioner

## 2023-05-28 LAB — URINE CULTURE

## 2023-06-02 ENCOUNTER — Other Ambulatory Visit: Payer: PPO

## 2023-06-02 ENCOUNTER — Ambulatory Visit: Payer: PPO | Admitting: Oncology

## 2023-06-02 ENCOUNTER — Ambulatory Visit: Payer: PPO

## 2023-06-02 DIAGNOSIS — F4542 Pain disorder with related psychological factors: Secondary | ICD-10-CM | POA: Diagnosis not present

## 2023-06-02 NOTE — Telephone Encounter (Signed)
 We received the pysch eval today. I forwarded it to [place].

## 2023-06-03 ENCOUNTER — Encounter: Payer: Self-pay | Admitting: Student in an Organized Health Care Education/Training Program

## 2023-06-03 ENCOUNTER — Ambulatory Visit
Admission: RE | Admit: 2023-06-03 | Discharge: 2023-06-03 | Disposition: A | Discharge status: Home / Self Care | Source: Ambulatory Visit | Attending: Student in an Organized Health Care Education/Training Program | Admitting: Student in an Organized Health Care Education/Training Program

## 2023-06-03 ENCOUNTER — Ambulatory Visit
Attending: Student in an Organized Health Care Education/Training Program | Admitting: Student in an Organized Health Care Education/Training Program

## 2023-06-03 VITALS — BP 125/59 | HR 73 | Temp 97.2°F | Resp 14 | Ht 67.0 in | Wt 124.0 lb

## 2023-06-03 DIAGNOSIS — M47816 Spondylosis without myelopathy or radiculopathy, lumbar region: Secondary | ICD-10-CM | POA: Diagnosis not present

## 2023-06-03 DIAGNOSIS — G894 Chronic pain syndrome: Secondary | ICD-10-CM | POA: Diagnosis not present

## 2023-06-03 MED ORDER — LIDOCAINE HCL 2 % IJ SOLN
20.0000 mL | Freq: Once | INTRAMUSCULAR | Status: AC
  Administered 2023-06-03: 400 mg

## 2023-06-03 MED ORDER — LACTATED RINGERS IV SOLN: Freq: Once | INTRAVENOUS | Status: AC

## 2023-06-03 MED ORDER — DEXAMETHASONE SODIUM PHOSPHATE 10 MG/ML IJ SOLN
INTRAMUSCULAR | Status: AC
  Filled 2023-06-03: qty 2

## 2023-06-03 MED ORDER — ROPIVACAINE HCL 2 MG/ML IJ SOLN
INTRAMUSCULAR | Status: AC
  Filled 2023-06-03: qty 20

## 2023-06-03 MED ORDER — MIDAZOLAM HCL 2 MG/2ML IJ SOLN
0.5000 mg | Freq: Once | INTRAMUSCULAR | Status: AC
Start: 2023-06-03 — End: 2023-06-03
  Administered 2023-06-03: 2 mg via INTRAVENOUS
  Filled 2023-06-03: qty 2

## 2023-06-03 MED ORDER — LIDOCAINE HCL 2 % IJ SOLN
INTRAMUSCULAR | Status: AC
  Filled 2023-06-03: qty 20

## 2023-06-03 MED ORDER — ROPIVACAINE HCL 2 MG/ML IJ SOLN
18.0000 mL | Freq: Once | INTRAMUSCULAR | Status: AC
  Administered 2023-06-03: 18 mL via PERINEURAL

## 2023-06-03 MED ORDER — DEXAMETHASONE SODIUM PHOSPHATE 10 MG/ML IJ SOLN
20.0000 mg | Freq: Once | INTRAMUSCULAR | Status: AC
Start: 2023-06-03 — End: 2023-06-03
  Administered 2023-06-03: 20 mg

## 2023-06-03 NOTE — Patient Instructions (Signed)

## 2023-06-03 NOTE — Progress Notes (Signed)
 PROVIDER NOTE: Interpretation of information contained herein should be left to medically-trained personnel. Specific patient instructions are provided elsewhere under "Patient Instructions" section of medical record. This document was created in part using STT-dictation technology, any transcriptional errors that may result from this process are unintentional.  Patient: Micheal Donovan Type: Established DOB: Jan 21, 1951 MRN: [id number] PCP: Donita Brooks, MD  Service: Procedure DOS: 06/03/2023 Setting: Ambulatory Location: Ambulatory outpatient facility Delivery: Face-to-face Provider: Galen Jolly, MD Specialty: Interventional Pain Management Specialty designation: 09 Location: Outpatient facility Ref. Prov.: Donita Brooks, MD       Interventional Therapy   Type: Lumbar Facet, Medial Branch Block(s) (w/ fluoroscopic mapping) #1  Laterality: Bilateral  Level: L3, L4, and L5 Medial Branch Level(s). Injecting these levels blocks the L3-4 and L4-5 lumbar facet joints. Imaging: Fluoroscopic guidance         Anesthesia: Local anesthesia (1-2% Lidocaine) Sedation: Minimal Sedation                       DOS: 06/03/2023 Performed by: Jarius Jolly, MD  Primary Purpose: Diagnostic/Therapeutic Indications: Low back pain severe enough to impact quality of life or function. 1. Lumbar facet arthropathy   2. Lumbar spondylosis   3. Chronic pain syndrome    NAS-11 Pain score:   Pre-procedure: 4 /10   Post-procedure: 0-No pain/10     Position / Prep / Materials:  Position: Prone  Prep solution: ChloraPrep (2% chlorhexidine gluconate and 70% isopropyl alcohol) Area Prepped: Posterolateral Lumbosacral Spine (Wide prep: From the lower border of the scapula down to the end of the tailbone and from flank to flank.)  Materials:  Tray: Block Needle(s):  Type: Spinal  Gauge (G): 22  Length: 3.5-in Qty: 2      H&P (Pre-op Assessment):  Micheal Donovan is a 73 y.o. (year old), male patient, seen  today for interventional treatment. He  has a past surgical history that includes Melanoma excision (2008); Tonsillectomy; Radiology with anesthesia (N/A, 04/05/2020); Video bronchoscopy with endobronchial ultrasound (N/A, 06/17/2021); Bronchial needle aspiration biopsy (06/17/2021); IR IMAGING GUIDED PORT INSERTION (08/05/2021); Radiology with anesthesia (N/A, 08/22/2021); Radiology with anesthesia (N/A, 12/17/2021); Radiology with anesthesia (N/A, 04/08/2022); and Anterior cervical decomp/discectomy fusion (N/A, 02/09/2023). Micheal Donovan has a current medication list which includes the following prescription(s): alprazolam, celecoxib, citalopram, docusate sodium, fentanyl, gabapentin, insulin glargine, b-d uf iii mini pen needles, levetiracetam, megestrol, methocarbamol, novolog flexpen, gerhardt's butt cream, omeprazole, oxycodone-acetaminophen, senna, tamsulosin, and [DISCONTINUED] prochlorperazine, and the following Facility-Administered Medications: heparin lock flush and lactated ringers. His primarily concern today is the Back Pain (Lumbar bilateral left is worse )  Initial Vital Signs:  Pulse/HCG Rate: 73ECG Heart Rate: 80 Temp: (!) 97.2 F (36.2 C) Resp: 16 BP: 125/66 SpO2: 100 %  BMI: Estimated body mass index is 19.42 kg/m as calculated from the following:   Height as of this encounter: 5\' 7"  (1.702 m).   Weight as of this encounter: 124 lb (56.2 kg).  Risk Assessment: Allergies: Reviewed. He is allergic to niaspan [niacin].  Allergy Precautions: None required Coagulopathies: Reviewed. None identified.  Blood-thinner therapy: None at this time Active Infection(s): Reviewed. None identified. Micheal Donovan is afebrile  Site Confirmation: Micheal Donovan was asked to confirm the procedure and laterality before marking the site Procedure checklist: Completed Consent: Before the procedure and under the influence of no sedative(s), amnesic(s), or anxiolytics, the patient was informed of the treatment options,  risks and possible complications. To fulfill our ethical and legal  obligations, as recommended by the American Medical Association's Code of Ethics, I have informed the patient of my clinical impression; the nature and purpose of the treatment or procedure; the risks, benefits, and possible complications of the intervention; the alternatives, including doing nothing; the risk(s) and benefit(s) of the alternative treatment(s) or procedure(s); and the risk(s) and benefit(s) of doing nothing. The patient was provided information about the general risks and possible complications associated with the procedure. These may include, but are not limited to: failure to achieve desired goals, infection, bleeding, organ or nerve damage, allergic reactions, paralysis, and death. In addition, the patient was informed of those risks and complications associated to Spine-related procedures, such as failure to decrease pain; infection (i.e.: Meningitis, epidural or intraspinal abscess); bleeding (i.e.: epidural hematoma, subarachnoid hemorrhage, or any other type of intraspinal or peri-dural bleeding); organ or nerve damage (i.e.: Any type of peripheral nerve, nerve root, or spinal cord injury) with subsequent damage to sensory, motor, and/or autonomic systems, resulting in permanent pain, numbness, and/or weakness of one or several areas of the body; allergic reactions; (i.e.: anaphylactic reaction); and/or death. Furthermore, the patient was informed of those risks and complications associated with the medications. These include, but are not limited to: allergic reactions (i.e.: anaphylactic or anaphylactoid reaction(s)); adrenal axis suppression; blood sugar elevation that in diabetics may result in ketoacidosis or comma; water retention that in patients with history of congestive heart failure may result in shortness of breath, pulmonary edema, and decompensation with resultant heart failure; weight gain; swelling or edema;  medication-induced neural toxicity; particulate matter embolism and blood vessel occlusion with resultant organ, and/or nervous system infarction; and/or aseptic necrosis of one or more joints. Finally, the patient was informed that Medicine is not an exact science; therefore, there is also the possibility of unforeseen or unpredictable risks and/or possible complications that may result in a catastrophic outcome. The patient indicated having understood very clearly. We have given the patient no guarantees and we have made no promises. Enough time was given to the patient to ask questions, all of which were answered to the patient's satisfaction. Mr. Bastin has indicated that he wanted to continue with the procedure. Attestation: I, the ordering provider, attest that I have discussed with the patient the benefits, risks, side-effects, alternatives, likelihood of achieving goals, and potential problems during recovery for the procedure that I have provided informed consent. Date  Time: 06/03/2023  1:05 PM   Pre-Procedure Preparation:  Monitoring: As per clinic protocol. Respiration, ETCO2, SpO2, BP, heart rate and rhythm monitor placed and checked for adequate function Safety Precautions: Patient was assessed for positional comfort and pressure points before starting the procedure. Time-out: I initiated and conducted the "Time-out" before starting the procedure, as per protocol. The patient was asked to participate by confirming the accuracy of the "Time Out" information. Verification of the correct person, site, and procedure were performed and confirmed by me, the nursing staff, and the patient. "Time-out" conducted as per Joint Commission's Universal Protocol (UP.01.01.01). Time: 1341 Start Time: 1341 hrs.  Description of Procedure:          Laterality: (see above) Targeted Levels: (see above)  Safety Precautions: Aspiration looking for blood return was conducted prior to all injections. At no point  did we inject any substances, as a needle was being advanced. Before injecting, the patient was told to immediately notify me if he was experiencing any new onset of "ringing in the ears, or metallic taste in the mouth". No attempts  were made at seeking any paresthesias. Safe injection practices and needle disposal techniques used. Medications properly checked for expiration dates. SDV (single dose vial) medications used. After the completion of the procedure, all disposable equipment used was discarded in the proper designated medical waste containers. Local Anesthesia: Protocol guidelines were followed. The patient was positioned over the fluoroscopy table. The area was prepped in the usual manner. The time-out was completed. The target area was identified using fluoroscopy. A 12-in long, straight, sterile hemostat was used with fluoroscopic guidance to locate the targets for each level blocked. Once located, the skin was marked with an approved surgical skin marker. Once all sites were marked, the skin (epidermis, dermis, and hypodermis), as well as deeper tissues (fat, connective tissue and muscle) were infiltrated with a small amount of a short-acting local anesthetic, loaded on a 10cc syringe with a 25G, 1.5-in  Needle. An appropriate amount of time was allowed for local anesthetics to take effect before proceeding to the next step. Local Anesthetic: Lidocaine 2.0% The unused portion of the local anesthetic was discarded in the proper designated containers. Technical description of process:   L3 Medial Branch Nerve Block (MBB): The target area for the L3 medial branch is at the junction of the postero-lateral aspect of the superior articular process and the superior, posterior, and medial edge of the transverse process of L4. Under fluoroscopic guidance, a Quincke needle was inserted until contact was made with os over the superior postero-lateral aspect of the pedicular shadow (target area). After  negative aspiration for blood, 2mL of the nerve block solution was injected without difficulty or complication. The needle was removed intact. L4 Medial Branch Nerve Block (MBB): The target area for the L4 medial branch is at the junction of the postero-lateral aspect of the superior articular process and the superior, posterior, and medial edge of the transverse process of L5. Under fluoroscopic guidance, a Quincke needle was inserted until contact was made with os over the superior postero-lateral aspect of the pedicular shadow (target area). After negative aspiration for blood, 2 mL of the nerve block solution was injected without difficulty or complication. The needle was removed intact. L5 Medial Branch Nerve Block (MBB): The target area for the L5 medial branch is at the junction of the postero-lateral aspect of the superior articular process and the superior, posterior, and medial edge of the sacral ala. Under fluoroscopic guidance, a Quincke needle was inserted until contact was made with os over the superior postero-lateral aspect of the pedicular shadow (target area). After negative aspiration for blood,21mL of the nerve block solution was injected without difficulty or complication. The needle was removed intact.  Once the entire procedure was completed, the treated area was cleaned, making sure to leave some of the prepping solution back to take advantage of its long term bactericidal properties.         Illustration of the posterior view of the lumbar spine and the posterior neural structures. Laminae of L2 through S1 are labeled. DPRL5, dorsal primary ramus of L5; DPRS1, dorsal primary ramus of S1; DPR3, dorsal primary ramus of L3; FJ, facet (zygapophyseal) joint L3-L4; I, inferior articular process of L4; LB1, lateral branch of dorsal primary ramus of L1; IAB, inferior articular branches from L3 medial branch (supplies L4-L5 facet joint); IBP, intermediate branch plexus; MB3, medial branch of  dorsal primary ramus of L3; NR3, third lumbar nerve root; S, superior articular process of L5; SAB, superior articular branches from L4 (supplies L4-5 facet joint  also); TP3, transverse process of L3.   Facet Joint Innervation (* possible contribution)  L1-2 T12, L1 (L2*)  Medial Branch  L2-3 L1, L2 (L3*)         "          "  L3-4 L2, L3 (L4*)         "          "  L4-5 L3, L4 (L5*)         "          "  L5-S1 L4, L5, S1          "          "    Vitals:   06/03/23 1340 06/03/23 1345 06/03/23 1350 06/03/23 1355  BP: 139/60 (!) 150/93 (!) 163/93 (!) 125/59  Pulse:      Resp: 15 (!) 21 14 14   Temp:      TempSrc:      SpO2: 100% 100% 100% 98%  Weight:      Height:         End Time: 1349 hrs.  Imaging Guidance (Spinal):          Type of Imaging Technique: Fluoroscopy Guidance (Spinal) Indication(s): Fluoroscopy guidance for needle placement to enhance accuracy in procedures requiring precise needle localization for targeted delivery of medication in or near specific anatomical locations not easily accessible without such real-time imaging assistance. Exposure Time: Please see nurses notes. Contrast: None used. Fluoroscopic Guidance: I was personally present during the use of fluoroscopy. "Tunnel Vision Technique" used to obtain the best possible view of the target area. Parallax error corrected before commencing the procedure. "Direction-depth-direction" technique used to introduce the needle under continuous pulsed fluoroscopy. Once target was reached, antero-posterior, oblique, and lateral fluoroscopic projection used confirm needle placement in all planes. Images permanently stored in EMR. Interpretation: No contrast injected. I personally interpreted the imaging intraoperatively. Adequate needle placement confirmed in multiple planes. Permanent images saved into the patient's record.  Post-operative Assessment:  Post-procedure Vital Signs:  Pulse/HCG Rate: 7378 Temp: (!) 97.2 F  (36.2 C) Resp: 14 BP: (!) 125/59 SpO2: 98 %  EBL: None  Complications: No immediate post-treatment complications observed by team, or reported by patient.  Note: The patient tolerated the entire procedure well. A repeat set of vitals were taken after the procedure and the patient was kept under observation following institutional policy, for this type of procedure. Post-procedural neurological assessment was performed, showing return to baseline, prior to discharge. The patient was provided with post-procedure discharge instructions, including a section on how to identify potential problems. Should any problems arise concerning this procedure, the patient was given instructions to immediately contact us, at any time, without hesitation. In any case, we plan to contact the patient by telephone for a follow-up status report regarding this interventional procedure.  Comments:  No additional relevant information.  Plan of Care (POC)  Orders:  Orders Placed This Encounter  Procedures   DG PAIN [facility] C-ARM 1-60 MIN NO REPORT    Intraoperative interpretation by procedural physician at [facility].    Standing Status:   Standing    Number of Occurrences:   1    Reason for exam::   Assistance in needle guidance and placement for procedures requiring needle placement in or near specific anatomical locations not easily accessible without such assistance.     Medications ordered for procedure: Meds ordered this encounter  Medications   lidocaine (XYLOCAINE)  2 % (with pres) injection 400 mg   lactated ringers infusion   midazolam (VERSED) injection 0.5-2 mg    Make sure Flumazenil is available in the pyxis when using this medication. If oversedation occurs, administer 0.2 mg IV over 15 sec. If after 45 sec no response, administer 0.2 mg again over 1 min; may repeat at 1 min intervals; not to exceed 4 doses (1 mg)   ropivacaine (PF) 2 mg/mL (0.2%) (NAROPIN) injection 18 mL    dexamethasone (DECADRON) injection 20 mg   Medications administered: We administered lidocaine, lactated ringers, midazolam, ropivacaine (PF) 2 mg/mL (0.2%), and dexamethasone.  See the medical record for exact dosing, route, and time of administration.  Follow-up plan:   Return in about 4 weeks (around 07/01/2023) for F2F PPE.       BLF L3-5 06/03/23- consider SPRINT PNS, windows also appropriate for potential SCS trial    Recent Visits Date Type Provider Dept  05/12/23 Office Visit Raymundo Jolly, MD [facility]  Showing recent visits within past 90 days and meeting all other requirements Today's Visits Date Type Provider Dept  06/03/23 Procedure visit Elester Jolly, MD [facility]  Showing today's visits and meeting all other requirements Future Appointments Date Type Provider Dept  07/06/23 Appointment Vadim Jolly, MD [facility]  Showing future appointments within next 90 days and meeting all other requirements  Disposition: Discharge home  Discharge (Date  Time): 06/03/2023; 1402 hrs.   Primary Care Physician: Donita Brooks, MD Location: [facility] Note by: Amedio Jolly, MD (TTS technology used. I apologize for any typographical errors that were not detected and corrected.) Date: 06/03/2023; Time: 2:09 PM  Disclaimer:  Medicine is not an Visual merchandiser. The only guarantee in medicine is that nothing is guaranteed. It is important to note that the decision to proceed with this intervention was based on the information collected from the patient. The Data and conclusions were drawn from the patient's questionnaire, the interview, and the physical examination. Because the information was provided in large part by the patient, it cannot be guaranteed that it has not been purposely or unconsciously manipulated. Every effort has been made to obtain as much relevant data as possible for this evaluation. It is important to  note that the conclusions that lead to this procedure are derived in large part from the available data. Always take into account that the treatment will also be dependent on availability of resources and existing treatment guidelines, considered by other Pain Management Practitioners as being common knowledge and practice, at the time of the intervention. For Medico-Legal purposes, it is also important to point out that variation in procedural techniques and pharmacological choices are the acceptable norm. The indications, contraindications, technique, and results of the above procedure should only be interpreted and judged by a Board-Certified Interventional Pain Specialist with extensive familiarity and expertise in the same exact procedure and technique.

## 2023-06-03 NOTE — Progress Notes (Signed)
 Safety precautions to be maintained throughout the outpatient stay will include: orient to surroundings, keep bed in low position, maintain call bell within reach at all times, provide assistance with transfer out of bed and ambulation.

## 2023-06-05 ENCOUNTER — Encounter: Payer: Self-pay | Admitting: Internal Medicine

## 2023-06-05 ENCOUNTER — Inpatient Hospital Stay: Payer: PPO | Attending: Radiation Oncology | Admitting: Internal Medicine

## 2023-06-05 ENCOUNTER — Ambulatory Visit: Admitting: Urology

## 2023-06-05 VITALS — BP 100/60 | HR 66

## 2023-06-05 VITALS — BP 88/55 | HR 58 | Temp 96.6°F | Resp 20 | Wt 132.6 lb

## 2023-06-05 DIAGNOSIS — G893 Neoplasm related pain (acute) (chronic): Secondary | ICD-10-CM | POA: Insufficient documentation

## 2023-06-05 DIAGNOSIS — I2699 Other pulmonary embolism without acute cor pulmonale: Secondary | ICD-10-CM | POA: Insufficient documentation

## 2023-06-05 DIAGNOSIS — R590 Localized enlarged lymph nodes: Secondary | ICD-10-CM | POA: Diagnosis not present

## 2023-06-05 DIAGNOSIS — Z791 Long term (current) use of non-steroidal anti-inflammatories (NSAID): Secondary | ICD-10-CM | POA: Diagnosis not present

## 2023-06-05 DIAGNOSIS — I110 Hypertensive heart disease with heart failure: Secondary | ICD-10-CM | POA: Diagnosis not present

## 2023-06-05 DIAGNOSIS — Z8673 Personal history of transient ischemic attack (TIA), and cerebral infarction without residual deficits: Secondary | ICD-10-CM | POA: Insufficient documentation

## 2023-06-05 DIAGNOSIS — G40909 Epilepsy, unspecified, not intractable, without status epilepticus: Secondary | ICD-10-CM | POA: Insufficient documentation

## 2023-06-05 DIAGNOSIS — Z87891 Personal history of nicotine dependence: Secondary | ICD-10-CM | POA: Insufficient documentation

## 2023-06-05 DIAGNOSIS — E785 Hyperlipidemia, unspecified: Secondary | ICD-10-CM | POA: Insufficient documentation

## 2023-06-05 DIAGNOSIS — R399 Unspecified symptoms and signs involving the genitourinary system: Secondary | ICD-10-CM

## 2023-06-05 DIAGNOSIS — N400 Enlarged prostate without lower urinary tract symptoms: Secondary | ICD-10-CM | POA: Diagnosis not present

## 2023-06-05 DIAGNOSIS — I700 Atherosclerosis of aorta: Secondary | ICD-10-CM | POA: Diagnosis not present

## 2023-06-05 DIAGNOSIS — Z794 Long term (current) use of insulin: Secondary | ICD-10-CM | POA: Insufficient documentation

## 2023-06-05 DIAGNOSIS — Z79899 Other long term (current) drug therapy: Secondary | ICD-10-CM | POA: Insufficient documentation

## 2023-06-05 DIAGNOSIS — I509 Heart failure, unspecified: Secondary | ICD-10-CM | POA: Diagnosis not present

## 2023-06-05 DIAGNOSIS — R8281 Pyuria: Secondary | ICD-10-CM

## 2023-06-05 DIAGNOSIS — E11319 Type 2 diabetes mellitus with unspecified diabetic retinopathy without macular edema: Secondary | ICD-10-CM | POA: Insufficient documentation

## 2023-06-05 DIAGNOSIS — M48061 Spinal stenosis, lumbar region without neurogenic claudication: Secondary | ICD-10-CM | POA: Diagnosis not present

## 2023-06-05 DIAGNOSIS — C3412 Malignant neoplasm of upper lobe, left bronchus or lung: Secondary | ICD-10-CM | POA: Insufficient documentation

## 2023-06-05 DIAGNOSIS — C7951 Secondary malignant neoplasm of bone: Secondary | ICD-10-CM | POA: Diagnosis not present

## 2023-06-05 DIAGNOSIS — Z85820 Personal history of malignant melanoma of skin: Secondary | ICD-10-CM | POA: Diagnosis not present

## 2023-06-05 DIAGNOSIS — M4856XA Collapsed vertebra, not elsewhere classified, lumbar region, initial encounter for fracture: Secondary | ICD-10-CM | POA: Diagnosis not present

## 2023-06-05 NOTE — Progress Notes (Signed)
 [facility] [address]  [place], [place] [address] [phone]   Interval Evaluation  Date of Service: 06/05/23 Patient Name: Micheal Donovan Patient MRN: [id number] Patient DOB: [birth year] Provider: Henreitta Leber, MD  Identifying Statement:  Micheal Donovan is a 73 y.o. male with Seizure disorder [facility])   Primary Cancer:  Oncologic History: Oncology History Overview Note  Diagnosis: Stage IIB T2b N1 M0 adenocarcinoma of the LUL, poorly differentiated     Primary non-small cell carcinoma of upper lobe of left lung (HCC)  06/13/2021 Initial Diagnosis   Primary non-small cell carcinoma of upper lobe of left lung [facility]) -06/20/2021 - 06/27/2021, patient presented to [facility] due to progressive headache/dizziness/gait changes. CT head showed acute to subacute intraparenchymal hemorrhage involving the left cerebellum.  Surrounding low-density vasogenic edema.  Patient was transferred to [facility]. This was further evaluated by CT angiogram of the neck which showed bulky calcified plaques/stenosis of carotid artery, Followed by MRI brain. 06/12/2021, MRI of the brain showed no significant interval change in size of the left cerebellar intraparenchymal hematoma with unchanged regional mass effect and partial effacement of fourth ventricle but no upstream hydrocephalus. There is no discernible enhancement to suggest underlying mass lesion, though acute blood products could mask enhancement.   06/12/2021 a chest x-ray showed a 4.4 cm left middle lobe. 06/13/2021, CT chest with contrast showed a 4.3 x 3.6 x 3.3 cm lobular spiculated mass in the posterior left upper lobe with T3 to the lateral pleura and major fissure.  Metastatic left hilar lymphadenopathy.  Peripheral micronodularity posterior right costophrenic sulcus.  Aortic atherosclerosis. 06/14/2021, CT abdomen pelvis showed right lower lobe pulmonary artery embolus.  No evidence of right  heart strain.  No acute intra-abdominal or pelvic pathology.  Aortic atherosclerosis.  06/13/2021, patient underwent bronchoscopy with EBUS by Dr. Katrinka Blazing.  Biopsy from the fine-needle aspiration of station 11 mL showed malignant cells, consistent with poorly differentiated non-small cell carcinoma, consistent with adenocarcinoma.  Malignant cells are TTF-1 positive and negative for p40.  Negative for neuroendocrine markers.  Blood test and tissue sample were tested for Gardant 360  -PD-L1 TPS 97%, no actionable mutation on the blood testing. 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   07/17/2021 Cancer Staging   Staging form: Lung, AJCC 8th Edition - Clinical: Stage IV (cT2b, cN1, cM1) - Signed by [name], MD on 08/06/2021   08/06/2021 Imaging   I saw patient's PET scan after his visit with me on 08/06/21. PET scan was ordered by his previous oncologist group and did not come to my in basket.. Patient received cycle 1 carboplatin Taxol today.  He has started on radiation.  5/26/3 PET scan showed 3.8 cm hypermetabolic left upper lobe mass with hypermetabolic left hilar and infrahilar adenopathy.  There is approximately 8 scattered metastatic lesions in the skeleton. Mixed density photopenic lesion in the left cerebellum. characterized as hemorrhage on recent prior imaging workups.    08/16/2021 -  Chemotherapy   Patient is on Treatment Plan : LUNG Carboplatin (5) + Pemetrexed + Pembrolizumab (200) D1 q21d Induction x 4 cycles / Maintenance Pemetrexed + Pembrolizumab (200) D1 q21d      08/22/2021 Imaging   MRI brain w wo contrast  Decrease in size of left cerebellar hematoma with resolution of edema. No evidence of underlying lesion. Smaller, more recent hemorrhage in the left cerebellar vermis with minimal edema. There is minimal enhancement without definite evidence of  underlying lesion.  New punctate focus of chronic blood products and enhancement in the left frontal lobe. Additional  new foci of chronic blood products in the posterior right putamen, right parietal subcortical white matter, and posterior right cerebellum. Unclear at this time if these represent foci of bland hemorrhage or early metastases.   Increase in size of right parietal osseous metastasis with minor extraosseous extension.     Imaging   PET scan showed 1. Interval response to therapy as evidenced by a small residual left upper lobe nodule with decreased hypermetabolism, no residual hypermetabolic adenopathy and decreased hypermetabolism associatedwith osseous metastases. 2. 6 mm posterior left upper lobe nodule, likely stable. Recommend attention on follow-up. 3. Aortic atherosclerosis (ICD10-I70.0). Coronary artery calcification.   12/17/2021 Imaging   MRI lumbar spine w wo contrast  1. No evidence of regional metastatic disease. 2. Late subacute superior endplate fracture at L2 and large superior endplate Schmorl's node at L5, unchanged since the PET scan of 10/29/2021. 3. L3-4: Shallow disc protrusion. Facet and ligamentous hypertrophy. Stenosis of both lateral recesses. Findings slightly worsened since 2022. 4. L4-5: Shallow disc protrusion. Facet and ligamentous hypertrophy. Small synovial cyst arising from the facet joint on the right. Stenosis of the lateral recesses right worse than left. Foraminal narrowing right worse than left. Findings have worsened since 2022.  5. L5-S1: Endplate osteophytes and shallow protrusion of the disc. Facet and ligamentous hypertrophy. Stenosis of the subarticular lateral recesses and neural foramina, right worse than left. Similar appearance to the study of 2022. 6. Continued evidence of enteritis of at least 1 loop of small bowel. Distended bladder present   12/18/2021 Imaging   Brian MRI w wo  1. Interval decrease in size of the left cerebellar and vermian hematomas. No definite evidence of underlying metastatic lesion. 2. There is are two possible contrast  enhancing lesions in the posterior left frontal lobe and left occipital lobe, which do not have intrinsic T1 signal abnormality, but have a somewhat linear appearance and may be vascular in nature. Recommend attention on follow up. 3. Multifocal sites of susceptibility artifact are redemonstrated with interval development of a few new foci, as described above. All of these sites demonstrate intrinsic T1 hyperintense signal abnormality and susceptibility artifact and are compatible with sites of microhemorrhages. Recommend continued attention on follow up. 4. Interval decrease in size of right parietal calvarial metastatic lesion. No new contrast enhancing lesions visualized. 5. Diffusely heterogeneous marrow signal throughout the cervical spine, which is nonspecific but can be seen in the setting of anemia, smoking, obesity, or a marrow replacement process.     12/18/2021 Imaging   MRI lumbar spine w wo contrast  1. No evidence of regional metastatic disease. 2. Late subacute superior endplate fracture at L2 and large superior endplate Schmorl's node at L5, unchanged since the PET scan of 10/29/2021 3. L3-4: Shallow disc protrusion. Facet and ligamentous hypertrophy.Stenosis of both lateral recesses. Findings slightly worsened since 2022. 4. L4-5: Shallow disc protrusion. Facet and ligamentous hypertrophy. Small synovial cyst arising from the facet joint on the right. Stenosis of the lateral recesses right worse than left. Foraminal narrowing right worse than left. Findings have worsened since 2022. 5. L5-S1: Endplate osteophytes and shallow protrusion of the disc. Facet and ligamentous hypertrophy. Stenosis of the subarticular lateral recesses and neural foramina, right worse than left. Similar appearance to the study of 2022. 6. Continued evidence of enteritis of at least 1 loop of small bowel. Distended bladder present.  01/28/2022 Imaging   CT chest abdomen pelvis w contrast 1.  Treated left upper lobe mass appears grossly stable to the prior examination when measured in a similar fashion on the prior study. No definite signs of extra skeletal metastatic disease noted in the chest, abdomen or pelvis. 2. Widespread skeletal metastases redemonstrated, as above. 3. Hepatic steatosis. 4. Aortic atherosclerosis, in addition to left main and three-vessel coronary artery disease. Assessment for potential risk factor modification, dietary therapy or pharmacologic therapy may be warranted, if clinically indicated. 5. Additional incidental findings,    04/08/2022 Imaging   MRI lumbar spine w wo contrast  1. Stable remote superior endplate fracture of L2 and large Schmorl to noted involving L5. No worrisome bone lesions to suggest metastatic disease. 2. Degenerative lumbar spondylosis with multilevel disc disease and facet disease which appears relatively stable as detailed above. 3. Enlarging right-sided synovial cyst at L4-5 with progressive mass effect on the right side of the thecal sac. This could potentially be symptomatic   04/28/2022 Imaging   CT chest abdomen pelvis wo contrast  1. Similar versus slightly decreased size of treated left upper lobe primary bronchogenic carcinoma. 2. Similar osseous metastasis. 3. No evidence of extraosseous metastatic disease. 4. New small right pleural effusion. 5. Coronary artery atherosclerosis. Aortic Atherosclerosis   07/03/2022 Imaging   PET scan showed 1. Interval development of a small focus of intense hypermetabolism within the left upper lobe scar. This is associated with a 5 mm perifissural nodule in the left lung which is hypermetabolic. Hypermetabolism at both of these locations is new since prior PET-CT and highly suspicious for recurrent disease. 2. No evidence for hypermetabolic soft tissue metastatic disease in the neck, abdomen, or pelvis. 3. Similar appearance of sclerotic and lytic bone lesions without hypermetabolism on  PET imaging. 4. Focal hypermetabolism associated with a fracture of the right L4 transverse process. The fracture was visible on the previous CT of 04/28/2022. No associated soft tissue lesion to suggest that this is pathologic in nature. Tracer accumulation on today's study is in keeping with healing fracture. There is also some minimal uptake in a prominent spur associated with the L4 superior endplate, most likely degenerative. 5.  Aortic Atherosclerosis    08/01/2022 - 08/14/2022 Radiation Therapy   Radiation to left lung.    10/01/2022 Imaging   chest angiogram PE protocol  No pulmonary embolism identified.   Stable nodular opacity in the left upper lobe. Please correlate for history of treated lung cancer. There is an adjacent small nodule along the course of the interlobar fissure which is slightly larger today compared to the study of February 2024. please correlate with findings of the recent PET-CT of 07/03/2022.      10/02/2022 Imaging   MRI brain with and without contrast 1. No acute intracranial abnormality. No evidence for intraparenchymal metastatic disease. 2. Chronic left cerebellar hemorrhage with multiple additional chronic micro hemorrhages elsewhere throughout the brain, stable. 3. Underlying mild chronic microvascular ischemic disease.    10/02/2022 Imaging   MRI lumbar spine showed 1. No MRI evidence for acute infection or other abnormality within the lumbar spine. 2. Few subcentimeter T1 hypointense lesions about the visualized posterior iliac wings. These correspond with small sclerotic foci seen on most recent CT from 04/08/2022, presumably reflecting patient's known osseous metastatic disease. These were not hypermetabolic on prior PET-CT from 29/56/2130. 3. No other evidence for metastatic disease within the lumbar spine itself. 4. Chronic L2 compression fracture, stable. Large Schmorl's node deformity  with associated height loss at L5, also stable. 5.  Multifactorial degenerative changes at L3-4 through L5-S1 with resultant mild to moderate bilateral subarticular stenosis, with mild to moderate bilateral L3 through L5 foraminal narrowing as above.   12/24/2022 Imaging   CT chest w contrast   1. Stable appearance of treated lung lesion within the left upper lobe with surrounding scarring and architectural distortion. 2. The previous FDG avid subpleural nodule abutting the oblique fissure of the left lung (adjacent to post treatment changes) is again noted measuring 7 mm. This is stable from 10/01/2022. On the PET-CT from 07/03/2022 this nodule was measured at 5 mm. 3. New bandlike area of subpleural consolidation within the lateral and posterior left lower lobe is identified. This is favored to represent post treatment change. 4. Stable sclerotic lesions involving the T12 and L1 vertebra. 5. Coronary artery calcifications. 6.  Aortic Atherosclerosis    03/06/2023 Imaging   CT chest abdomen pelvis wo contrast showed  Increasing opacity along the lingula with the increasing nodular central component compared to the recent CT scan.   There is also some changing opacities in the left lower lobe. The larger more peripheral area in the left lower lobe is decreasing with some new areas of subtle ground-glass more caudal. Attention on follow-up.   No developing new other mass lesion or nodal enlargement.   Scattered sclerotic bone lesions has a similar distribution to previous.   Overall evaluation for solid organ pathology including solid organ metastases are limited without the advantage of IV contrast.     03/23/2023 Imaging   PET scan showed 1. Post radiation consolidation in the lingula with mild residual focal hypermetabolism, overall improved from 07/03/2022. No evidence of disease progression. 2. Quiescent osseous metastatic disease. 3. Aortic atherosclerosis (ICD10-I70.0). Coronary artery calcification.     Interval  History: Micheal Donovan presents today for follow up.  No new or progressive changes.  No further seizures.  He continues to work well with PT, taking "a few steps with the walker".  Continues on [place] as prior.  Prior- He had presented with acute on chronic left side (mainly leg) weakness, accompanied by episodes of aphasa and staring.  Stroke workup was negative, he was treated for suspected seizures with Keppra and discharged with physical therapy.  Remains with imbalance and poor coordination of left leg as prior, worsened lately by pain in his hip and lower back.  Now unable to use a walker and is requiring wheelchair at home.  No seizure events since starting the Keppra.  Continues on Keytruda with Dr Cathie Hoops.  H+P (02/21/22) Patient presents for evaluation today for stroke history.  He presented initially in April 2023 with episode of sudden onset dizziness, inability to walk.  Following workup, he returned gradually to baseline functional/gait status after 2 months of intense rehab.  Lung cancer was also diagnosed in this time period, in addition to pulmonary embolism.  He was discharged on low dose Eliquis, but his has been discontinued by Dr. Cathie Hoops given ongoing CNS blood products.  Aside from recent issues with lower legs, swelling and rash, his gait and functional status have been independent.  He is not taking aspirin, statin, or blood pressure medications currently.  Medications: Current Outpatient Medications on File Prior to Visit  Medication Sig Dispense Refill   ALPRAZolam (XANAX) 0.5 MG tablet Take 1 tablet (0.5 mg total) by mouth at bedtime as needed for anxiety. 15 tablet 0   citalopram (CELEXA) 10 MG tablet  Take 1 tablet (10 mg total) by mouth daily. 30 tablet 3   docusate sodium (COLACE) 100 MG capsule Take 100 mg by mouth 2 (two) times daily as needed for mild constipation.     fentaNYL (DURAGESIC) 75 MCG/HR Place 1 patch onto the skin every 3 (three) days. 10 patch 0   gabapentin  (NEURONTIN) 300 MG capsule Take 2 capsules (600 mg total) by mouth 2 (two) times daily. 120 capsule 2   insulin glargine (LANTUS) 100 UNIT/ML injection Inject 0-30 Units into the skin daily as needed (High Blood Sugar over 150).     Insulin Pen Needle (B-D UF III MINI PEN NEEDLES) 31G X 5 MM MISC Use to inject insulin daily. 100 each 3   levETIRAcetam (KEPPRA) 500 MG tablet Take 1 tablet (500 mg total) by mouth 2 (two) times daily. 180 tablet 3   NOVOLOG FLEXPEN 100 UNIT/ML FlexPen Inject 0-10 Units into the skin daily as needed for high blood sugar (BG > 200).     Nystatin (GERHARDT'S BUTT CREAM) CREA Apply 14 Applications topically 2 (two) times daily. 1 each 0   omeprazole (PRILOSEC) 20 MG capsule Take 20 mg by mouth daily.     oxyCODONE-acetaminophen (PERCOCET) 7.5-325 MG tablet Take 1 tablet by mouth every 4 (four) hours as needed for severe pain (pain score 7-10). Do not take with cough medication or other pain medication 60 tablet 0   senna (SENOKOT) 8.6 MG TABS tablet Take 1 tablet (8.6 mg total) by mouth 2 (two) times daily as needed for mild constipation. 30 tablet 0   tamsulosin (FLOMAX) 0.4 MG CAPS capsule Take 1 capsule (0.4 mg total) by mouth daily. 30 capsule 2   celecoxib (CELEBREX) 200 MG capsule Take 1 capsule (200 mg total) by mouth 2 (two) times daily. (Patient not taking: Reported on 06/05/2023) 60 capsule 0   megestrol (MEGACE) 40 MG tablet Take 1 tablet (40 mg total) by mouth daily. (Patient not taking: Reported on 06/05/2023) 30 tablet 3   methocarbamol (ROBAXIN) 500 MG tablet Take 1 tablet (500 mg total) by mouth every 6 (six) hours as needed for muscle spasms. (Patient not taking: Reported on 06/05/2023) 120 tablet 0   [DISCONTINUED] prochlorperazine (COMPAZINE) 10 MG tablet Take 1 tablet (10 mg total) by mouth every 6 (six) hours as needed (Nausea or vomiting). 30 tablet 1   Current Facility-Administered Medications on File Prior to Visit  Medication Dose Route Frequency Provider  Last Rate Last Admin   heparin lock flush 100 UNIT/ML injection             Allergies:  Allergies  Allergen Reactions   Niaspan [Niacin]     FLUSHING   Past Medical History:  Past Medical History:  Diagnosis Date   Allergy    Anxiety    BPH (benign prostatic hypertrophy)    Cancer (HCC)    Melanoma on Neck    2008   Carotid artery occlusion    CHF (congestive heart failure) (HCC)    Diabetes mellitus    type 2   ED (erectile dysfunction)    GERD (gastroesophageal reflux disease)    Hemorrhagic stroke (HCC) 06/2021   Hyperlipidemia    Hypertension    Lung cancer (HCC)    Neoplasm related pain    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)    Stroke [facility])    Past Surgical History:  Past Surgical History:  Procedure Laterality Date   ANTERIOR CERVICAL DECOMP/DISCECTOMY FUSION N/A 02/09/2023  Procedure: C3-5 ANTERIOR CERVICAL DISCECTOMY AND FUSION;  Surgeon: Venetia Night, MD;  Location: [facility] ORS;  Service: Neurosurgery;  Laterality: N/A;   BRONCHIAL NEEDLE ASPIRATION BIOPSY  06/17/2021   Procedure: BRONCHIAL NEEDLE ASPIRATION BIOPSIES;  Surgeon: Lorin Glass, MD;  Location[facility] ENDOSCOPY;  Service: Pulmonary;;   IR IMAGING GUIDED PORT INSERTION  08/05/2021   MELANOMA EXCISION  2008   Left side of neck   RADIOLOGY WITH ANESTHESIA N/A 04/05/2020   Procedure: MRI SPINE WITOUT CONTRAST;  Surgeon: Radiologist, Medication, MD;  Location: [facility] OR;  Service: Radiology;  Laterality: N/A;   RADIOLOGY WITH ANESTHESIA N/A 08/22/2021   Procedure: MRI BRAIN WITH AND WITHOUT CONTRAST  WITH ANESTHESIA;  Surgeon: Radiologist, Medication, MD;  Location: [facility] OR;  Service: Radiology;  Laterality: N/A;   RADIOLOGY WITH ANESTHESIA N/A 12/17/2021   Procedure: MRI BRAIN WITH AND WITHOUT CONTRAST WITH ANESTHESIA; MRI LUMBER WITH AND WITHOUT CONTRAST;  Surgeon: Radiologist, Medication, MD;  Location: [facility] OR;  Service: Radiology;  Laterality: N/A;   RADIOLOGY WITH ANESTHESIA N/A 04/08/2022   Procedure: MRI  LUMBER SPINE WITH AND WITHOUT CONTRAST;  Surgeon: Radiologist, Medication, MD;  Location: [facility] OR;  Service: Radiology;  Laterality: N/A;   TONSILLECTOMY     VIDEO BRONCHOSCOPY WITH ENDOBRONCHIAL ULTRASOUND N/A 06/17/2021   Procedure: VIDEO BRONCHOSCOPY WITH ENDOBRONCHIAL ULTRASOUND;  Surgeon: Lorin Glass, MD;  Location[facility] ENDOSCOPY;  Service: Pulmonary;  Laterality: N/A;   Social History:  Social History   Socioeconomic History   Marital status: Married    Spouse name: Not on file   Number of children: Not on file   Years of education: Not on file   Highest education level: Associate degree: occupational, Scientist, product/process development, or vocational program  Occupational History   Not on file  Tobacco Use   Smoking status: Former    Current packs/day: 0.00    Types: Cigarettes    Quit date: 07/21/1990    Years since quitting: 32.8   Smokeless tobacco: Never  Vaping Use   Vaping status: Never Used  Substance and Sexual Activity   Alcohol use: No   Drug use: No   Sexual activity: Not Currently  Other Topics Concern   Not on file  Social History Narrative   Not on file   Social Drivers of Health   Financial Resource Strain: Low Risk  (05/27/2023)   Overall Financial Resource Strain (CARDIA)    Difficulty of Paying Living Expenses: Not very hard  Food Insecurity: No Food Insecurity (05/27/2023)   Hunger Vital Sign    Worried About Running Out of Food in the Last Year: Never true    Ran Out of Food in the Last Year: Never true  Transportation Needs: No Transportation Needs (05/27/2023)   PRAPARE - Administrator, Civil Service (Medical): No    Lack of Transportation (Non-Medical): No  Physical Activity: Unknown (05/27/2023)   Exercise Vital Sign    Days of Exercise per Week: 0 days    Minutes of Exercise per Session: Not on file  Stress: Stress Concern Present (05/27/2023)   [employer] of Occupational Health - Occupational Stress Questionnaire    Feeling of Stress : To some  extent  Social Connections: Unknown (05/27/2023)   Social Connection and Isolation Panel [NHANES]    Frequency of Communication with Friends and Family: Once a week    Frequency of Social Gatherings with Friends and Family: Patient declined    Attends Religious Services: Patient unable to answer  Active Member of Clubs or Organizations: No    Attends Banker Meetings: Patient unable to answer    Marital Status: Married  Catering manager Violence: Not At Risk (04/22/2023)   Humiliation, Afraid, Rape, and Kick questionnaire    Fear of Current or Ex-Partner: No    Emotionally Abused: No    Physically Abused: No    Sexually Abused: No   Family History:  Family History  Problem Relation Age of Onset   COPD Mother    Clotting disorder Mother    Heart disease Father    Heart disease Brother        MI at age 6   Clotting disorder Maternal Grandmother    Stroke Neg Hx     Review of Systems: Constitutional: Doesn't report fevers, chills or abnormal weight loss Eyes: Doesn't report blurriness of vision Ears, nose, mouth, throat, and face: Doesn't report sore throat Respiratory: Doesn't report cough, dyspnea or wheezes Cardiovascular: Doesn't report palpitation, chest discomfort  Gastrointestinal:  Doesn't report nausea, constipation, diarrhea GU: Doesn't report incontinence Skin: Doesn't report skin rashes Neurological: Per HPI Musculoskeletal: Doesn't report joint pain Behavioral/Psych: Doesn't report anxiety  Physical Exam: Vitals:   06/05/23 1127  BP: (!) 88/55  Pulse: (!) 58  Resp: 20  Temp: (!) 96.6 F (35.9 C)  SpO2: 100%     KPS: 60. General: Alert, cooperative, pleasant, in no acute distress Head: Normal EENT: No conjunctival injection or scleral icterus.  Lungs: Resp effort normal Cardiac: Regular rate Abdomen: Non-distended abdomen Skin: Erythema lower legs Extremities: No clubbing or edema  Neurologic Exam: Mental Status: Awake, alert,  attentive to examiner. Oriented to self and environment. Language is fluent with intact comprehension.  Cranial Nerves: Visual acuity is grossly normal. Visual fields are full. Extra-ocular movements intact. No ptosis. Face is symmetric Motor: Tone and bulk are normal. Power is 3/5 in left leg proximal and distal. Reflexes are symmetric, no pathologic reflexes present.  Sensory: Intact to light touch Gait: Non ambulatory   Labs: I have reviewed the data as listed    Component Value Date/Time   NA 136 05/26/2023 1255   K 3.9 05/26/2023 1255   CL 101 05/26/2023 1255   CO2 24 05/26/2023 1255   GLUCOSE 136 (H) 05/26/2023 1255   BUN 16 05/26/2023 1255   CREATININE 1.06 05/26/2023 1255   CREATININE 1.11 10/09/2022 1500   CALCIUM 8.8 (L) 05/26/2023 1255   PROT 6.9 05/26/2023 1255   ALBUMIN 3.4 (L) 05/26/2023 1255   AST 20 05/26/2023 1255   AST 18 07/17/2021 1345   ALT 11 05/26/2023 1255   ALT 20 07/17/2021 1345   ALKPHOS 61 05/26/2023 1255   BILITOT 0.8 05/26/2023 1255   BILITOT 0.6 07/17/2021 1345   GFRNONAA >60 05/26/2023 1255   GFRNONAA >60 07/17/2021 1345   GFRNONAA 88 03/05/2016 0824   GFRAA >89 03/05/2016 0824   Lab Results  Component Value Date   WBC 6.5 05/26/2023   NEUTROABS 4.7 05/26/2023   HGB 12.4 (L) 05/26/2023   HCT 38.4 (L) 05/26/2023   MCV 91.6 05/26/2023   PLT 302 05/26/2023    Imaging: CLINICAL DATA:  Initial evaluation for lumbar radiculopathy. Infection suspected. History of lung cancer.   EXAM: MRI LUMBAR SPINE WITHOUT AND WITH CONTRAST   TECHNIQUE: Multiplanar and multiecho pulse sequences of the lumbar spine were obtained without and with intravenous contrast.   CONTRAST:  6mL GADAVIST GADOBUTROL 1 MMOL/ML IV SOLN   COMPARISON:  Prior study  from 04/08/2022.   FINDINGS: Segmentation: Standard. Lowest well-formed disc space labeled the L5-S1 level.   Alignment: 4 mm retrolisthesis of L5 on S1. Straightening with mild reversal of the normal  lumbar lordosis.   Vertebrae: Chronic compression deformity involving the superior endplate of L2, stable. Large endplate Schmorl's node deformity with associated height loss at the superior endplate of L5, also stable. Smaller endplate Schmorl's node deformity with mild height loss at the superior endplate of L3 noted as well, also stable. Vertebral body height otherwise maintained with no acute or interval fracture. Underlying bone marrow signal intensity within normal limits. A few small T1 hypointense lesions seen about the visualized posterior iliac wings bilaterally, largest of which seen on the left and measures 8 mm (series 12, image 33). These correspond with previously seen small sclerotic lesions, presumably reflecting patient's known osseous metastases. These were not hypermetabolic on prior PET-CT from 16/12/9602. Mild degenerative reactive endplate changes noted about the L3-4 and L5-S1 interspaces. No evidence for osteomyelitis discitis or septic arthritis.   Conus medullaris and cauda equina: Conus extends to the L1-2 level. Conus and cauda equina appear normal.   Paraspinal and other soft tissues: Paraspinous soft tissues demonstrate no acute finding. Chronic fatty atrophy noted about the posterior paraspinous and psoas musculature.   Disc levels:   L1-2: Disc desiccation with mild disc bulge. Mild facet spurring. No spinal stenosis. Foramina remain patent.   L2-3: Disc desiccation with mild disc bulge. No spinal stenosis. Foramina remain patent.   L3-4: Degenerative intervertebral disc space narrowing with diffuse disc bulge. Associated reactive endplate spurring. Mild bilateral facet hypertrophy. Resultant mild left greater than right lateral recess stenosis. Central canal remains patent. Mild to moderate left with mild right L3 foraminal stenosis.   L4-5: Mild disc bulge with reactive endplate spurring. Mild to moderate facet hypertrophy. Resultant mild to  moderate bilateral subarticular stenosis. Central canal remains patent. Mild to moderate left with moderate right L4 foraminal stenosis.   L5-S1: Retrolisthesis with degenerative intervertebral disc space narrowing. Central disc protrusion with slight inferior angulation and annular fissure indents the ventral thecal sac (series 12, image 33). Mild bilateral facet hypertrophy. Resultant mild bilateral subarticular stenosis. Central canal remains patent. Moderate bilateral L5 foraminal narrowing.   IMPRESSION: 1. No MRI evidence for acute infection or other abnormality within the lumbar spine. 2. Few subcentimeter T1 hypointense lesions about the visualized posterior iliac wings. These correspond with small sclerotic foci seen on most recent CT from 04/08/2022, presumably reflecting patient's known osseous metastatic disease. These were not hypermetabolic on prior PET-CT from 54/11/8117. 3. No other evidence for metastatic disease within the lumbar spine itself. 4. Chronic L2 compression fracture, stable. Large Schmorl's node deformity with associated height loss at L5, also stable. 5. Multifactorial degenerative changes at L3-4 through L5-S1 with resultant mild to moderate bilateral subarticular stenosis, with mild to moderate bilateral L3 through L5 foraminal narrowing as above.     Electronically Signed   By: Rise Mu M.D.   On: 10/02/2022 05:22   Assessment/Plan Seizure disorder [facility])  ORY ELTING is clinically stable today.  He should continue Keppra 500mg  BID.  Left leg weakness is modestly improved with aggressive home PT.  For stroke prevention, recommended continuing Aspirin 81mg  daily and Atorvastatin 40mg  daily.  He will con't managing diabetes with insulin, diet.  We do not recommend resuming anticoagulation given recurrent hemorrhagic foci.  We appreciate the opportunity to participate in the care of Micheal Donovan.   We  ask that Micheal Donovan  return to clinic in 6 months or sooner if needed.  All questions were answered. The patient knows to call the clinic with any problems, questions or concerns. No barriers to learning were detected.  The total time spent in the encounter was 30 minutes and more than 50% was on counseling and review of test results   Henreitta Leber, MD Medical Director of Neuro-Oncology [facility] at [place] 06/05/23 11:34 AM

## 2023-06-05 NOTE — Progress Notes (Signed)
   06/05/23  CC:  Chief Complaint  Patient presents with   Cysto    HPI: Refer to my previous note 03/26/2023.  He did not see any significant change in his voiding pattern with the addition of tamsulosin.  He was wearing his condom catheter and could not provide a urine specimen.  On chart review a urine culture 05/26/2023 showed mixed flora and was negative for infection  Blood pressure 100/60, pulse 66. NED. A&Ox3.   No respiratory distress   Abd soft, NT, ND Normal phallus with bilateral descended testicles  Cystoscopy Procedure Note  Patient identification was confirmed, informed consent was obtained, and patient was prepped using Betadine solution.  Lidocaine jelly was administered per urethral meatus.     Pre-Procedure: - Inspection reveals a normal caliber urethral meatus.  Procedure: The flexible cystoscope was introduced without difficulty.  On introduction of the cystoscope the bladder was noted to be full. - No urethral strictures/lesions are present. - No significant lateral lobe enlargement prostate  - Normal bladder neck - Bilateral ureteral orifices identified - Bladder mucosa  reveals no ulcers, tumors, or lesions - No bladder stones - Mild trabeculation  Retroflexion shows no abnormalities   Post-Procedure: - Patient tolerated the procedure well  Assessment/ Plan: No significant prostate enlargement Prostate volume calculated on CT December 2024 was 29 cc (ellipsoid); bullet volume 36 cc Bladder was full on introduction the cystoscope and hesitant to add a beta 3 agonists.  PVR at last visit was 86 cc.  Recommend 2-week follow-up with repeat PVR and if no significant residual add Retta Diones, MD

## 2023-06-09 ENCOUNTER — Encounter: Payer: Self-pay | Admitting: Vascular Surgery

## 2023-06-09 ENCOUNTER — Ambulatory Visit: Admitting: Vascular Surgery

## 2023-06-09 VITALS — BP 116/64 | HR 94 | Temp 98.4°F

## 2023-06-09 DIAGNOSIS — I70235 Atherosclerosis of native arteries of right leg with ulceration of other part of foot: Secondary | ICD-10-CM

## 2023-06-09 NOTE — Progress Notes (Signed)
 [facility] [place]  ASSESSMENT / PLAN: Micheal Donovan is a 73 y.o. male with atherosclerosis of native arteries of right lower extremity causing ulceration.  Recommend:  Abstinence from all tobacco products. Blood glucose control with goal A1c < 7%. Blood pressure control with goal blood pressure < 140/90 mmHg. Lipid reduction therapy with goal LDL-C <100 mg/dL Aspirin 81mg  PO QD.  Atorvastatin 40-80mg  PO QD (or other "high intensity" statin therapy).  Patient a poor candidate for vascular intervention.  Will plan watchful waiting of lower extremity ulcer.   CHIEF COMPLAINT: Right great toe ulcer  HISTORY OF PRESENT ILLNESS: Micheal Donovan is a 73 y.o. male referred to clinic for evaluation of abnormal ankle-brachial index.  The patient is a fairly debilitated elderly gentleman with stage IV lung cancer.  He is minimally ambulatory, in need of assistance with most of his ADLs.  He is able to transfer with assistance.  He has significant neuropathy in his bilateral lower extremities.  He has a ulcer on his right great toe, which has been improving with local wound care.  He is here today with a health aide.  Past Medical History:  Diagnosis Date   Allergy    Anxiety    BPH (benign prostatic hypertrophy)    Cancer (HCC)    Melanoma on Neck    2008   Carotid artery occlusion    CHF (congestive heart failure) (HCC)    Diabetes mellitus    type 2   ED (erectile dysfunction)    GERD (gastroesophageal reflux disease)    Hemorrhagic stroke (HCC) 06/2021   Hyperlipidemia    Hypertension    Lung cancer (HCC)    Neoplasm related pain    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)    Stroke [facility])     Past Surgical History:  Procedure Laterality Date   ANTERIOR CERVICAL DECOMP/DISCECTOMY FUSION N/A 02/09/2023   Procedure: C3-5 ANTERIOR CERVICAL DISCECTOMY AND FUSION;  Surgeon: Venetia Night, MD;  Location: [facility] ORS;  Service: Neurosurgery;  Laterality: N/A;    BRONCHIAL NEEDLE ASPIRATION BIOPSY  06/17/2021   Procedure: BRONCHIAL NEEDLE ASPIRATION BIOPSIES;  Surgeon: Lorin Glass, MD;  Location: [facility] ENDOSCOPY;  Service: Pulmonary;;   IR IMAGING GUIDED PORT INSERTION  08/05/2021   MELANOMA EXCISION  2008   Left side of neck   RADIOLOGY WITH ANESTHESIA N/A 04/05/2020   Procedure: MRI SPINE WITOUT CONTRAST;  Surgeon: Radiologist, Medication, MD;  Location: [facility] OR;  Service: Radiology;  Laterality: N/A;   RADIOLOGY WITH ANESTHESIA N/A 08/22/2021   Procedure: MRI BRAIN WITH AND WITHOUT CONTRAST  WITH ANESTHESIA;  Surgeon: Radiologist, Medication, MD;  Location: [facility] OR;  Service: Radiology;  Laterality: N/A;   RADIOLOGY WITH ANESTHESIA N/A 12/17/2021   Procedure: MRI BRAIN WITH AND WITHOUT CONTRAST WITH ANESTHESIA; MRI LUMBER WITH AND WITHOUT CONTRAST;  Surgeon: Radiologist, Medication, MD;  Location: [facility] OR;  Service: Radiology;  Laterality: N/A;   RADIOLOGY WITH ANESTHESIA N/A 04/08/2022   Procedure: MRI LUMBER SPINE WITH AND WITHOUT CONTRAST;  Surgeon: Radiologist, Medication, MD;  Location: [facility] OR;  Service: Radiology;  Laterality: N/A;   TONSILLECTOMY     VIDEO BRONCHOSCOPY WITH ENDOBRONCHIAL ULTRASOUND N/A 06/17/2021   Procedure: VIDEO BRONCHOSCOPY WITH ENDOBRONCHIAL ULTRASOUND;  Surgeon: Lorin Glass, MD;  Location: [facility] ENDOSCOPY;  Service: Pulmonary;  Laterality: N/A;    Family History  Problem Relation Age of Onset   COPD Mother    Clotting disorder Mother    Heart disease  Father    Heart disease Brother        MI at age 31   Clotting disorder Maternal Grandmother    Stroke Neg Hx     Social History   Socioeconomic History   Marital status: Married    Spouse name: Not on file   Number of children: Not on file   Years of education: Not on file   Highest education level: Associate degree: occupational, Scientist, product/process development, or vocational program  Occupational History   Not on file  Tobacco Use   Smoking status: Former    Current packs/day: 0.00     Types: Cigarettes    Quit date: 07/21/1990    Years since quitting: 32.9   Smokeless tobacco: Never  Vaping Use   Vaping status: Never Used  Substance and Sexual Activity   Alcohol use: No   Drug use: No   Sexual activity: Not Currently  Other Topics Concern   Not on file  Social History Narrative   Not on file   Social Drivers of Health   Financial Resource Strain: Low Risk  (05/27/2023)   Overall Financial Resource Strain (CARDIA)    Difficulty of Paying Living Expenses: Not very hard  Food Insecurity: No Food Insecurity (05/27/2023)   Hunger Vital Sign    Worried About Running Out of Food in the Last Year: Never true    Ran Out of Food in the Last Year: Never true  Transportation Needs: No Transportation Needs (05/27/2023)   PRAPARE - Administrator, Civil Service (Medical): No    Lack of Transportation (Non-Medical): No  Physical Activity: Unknown (05/27/2023)   Exercise Vital Sign    Days of Exercise per Week: 0 days    Minutes of Exercise per Session: Not on file  Stress: Stress Concern Present (05/27/2023)   [employer] of Occupational Health - Occupational Stress Questionnaire    Feeling of Stress : To some extent  Social Connections: Unknown (05/27/2023)   Social Connection and Isolation Panel [NHANES]    Frequency of Communication with Friends and Family: Once a week    Frequency of Social Gatherings with Friends and Family: Patient declined    Attends Religious Services: Patient unable to answer    Active Member of Clubs or Organizations: No    Attends Banker Meetings: Patient unable to answer    Marital Status: Married  Catering manager Violence: Not At Risk (04/22/2023)   Humiliation, Afraid, Rape, and Kick questionnaire    Fear of Current or Ex-Partner: No    Emotionally Abused: No    Physically Abused: No    Sexually Abused: No    Allergies  Allergen Reactions   Niaspan [Niacin]     FLUSHING    Current Outpatient  Medications  Medication Sig Dispense Refill   ALPRAZolam (XANAX) 0.5 MG tablet Take 1 tablet (0.5 mg total) by mouth at bedtime as needed for anxiety. 15 tablet 0   celecoxib (CELEBREX) 200 MG capsule Take 1 capsule (200 mg total) by mouth 2 (two) times daily. 60 capsule 0   citalopram (CELEXA) 10 MG tablet Take 1 tablet (10 mg total) by mouth daily. 30 tablet 3   docusate sodium (COLACE) 100 MG capsule Take 100 mg by mouth 2 (two) times daily as needed for mild constipation.     fentaNYL (DURAGESIC) 75 MCG/HR Place 1 patch onto the skin every 3 (three) days. 10 patch 0   gabapentin (NEURONTIN) 300 MG capsule Take 2 capsules (  600 mg total) by mouth 2 (two) times daily. 120 capsule 2   insulin glargine (LANTUS) 100 UNIT/ML injection Inject 0-30 Units into the skin daily as needed (High Blood Sugar over 150).     Insulin Pen Needle (B-D UF III MINI PEN NEEDLES) 31G X 5 MM MISC Use to inject insulin daily. 100 each 3   levETIRAcetam (KEPPRA) 500 MG tablet Take 1 tablet (500 mg total) by mouth 2 (two) times daily. 180 tablet 3   megestrol (MEGACE) 40 MG tablet Take 1 tablet (40 mg total) by mouth daily. 30 tablet 3   methocarbamol (ROBAXIN) 500 MG tablet Take 1 tablet (500 mg total) by mouth every 6 (six) hours as needed for muscle spasms. 120 tablet 0   NOVOLOG FLEXPEN 100 UNIT/ML FlexPen Inject 0-10 Units into the skin daily as needed for high blood sugar (BG > 200).     Nystatin (GERHARDT'S BUTT CREAM) CREA Apply 14 Applications topically 2 (two) times daily. 1 each 0   omeprazole (PRILOSEC) 20 MG capsule Take 20 mg by mouth daily.     oxyCODONE-acetaminophen (PERCOCET) 7.5-325 MG tablet Take 1 tablet by mouth every 4 (four) hours as needed for severe pain (pain score 7-10). Do not take with cough medication or other pain medication 60 tablet 0   senna (SENOKOT) 8.6 MG TABS tablet Take 1 tablet (8.6 mg total) by mouth 2 (two) times daily as needed for mild constipation. 30 tablet 0   tamsulosin  (FLOMAX) 0.4 MG CAPS capsule Take 1 capsule (0.4 mg total) by mouth daily. 30 capsule 2   No current facility-administered medications for this visit.   Facility-Administered Medications Ordered in Other Visits  Medication Dose Route Frequency Provider Last Rate Last Admin   heparin lock flush 100 UNIT/ML injection             PHYSICAL EXAM Vitals:   06/09/23 1356  BP: 116/64  Pulse: 94  Temp: 98.4 F (36.9 C)  SpO2: 96%    Elderly man in no distress. In a wheelchair, deconditioned Regular rate and rhythm Unlabored breathing Chronic Foley in place No palpable pedal pulses in lower extremities Right great toe ulcer about the size of an eraser head confined to the dermis  PERTINENT LABORATORY AND RADIOLOGIC DATA  Most recent CBC    Latest Ref Rng & Units 05/26/2023   12:55 PM 05/05/2023   12:56 PM 04/21/2023    5:57 AM  CBC  WBC 4.0 - 10.5 K/uL 6.5  8.3  4.7   Hemoglobin 13.0 - 17.0 g/dL 16.1  09.6  04.5   Hematocrit 39.0 - 52.0 % 38.4  38.6  33.2   Platelets 150 - 400 K/uL 302  239  227      Most recent CMP    Latest Ref Rng & Units 05/26/2023   12:55 PM 05/05/2023   12:56 PM 04/21/2023    5:57 AM  CMP  Glucose 70 - 99 mg/dL 409  811  914   BUN 8 - 23 mg/dL 16  23  19    Creatinine 0.61 - 1.24 mg/dL 7.82  9.56  2.13   Sodium 135 - 145 mmol/L 136  134  135   Potassium 3.5 - 5.1 mmol/L 3.9  4.3  3.7   Chloride 98 - 111 mmol/L 101  99  105   CO2 22 - 32 mmol/L 24  26  22    Calcium 8.9 - 10.3 mg/dL 8.8  8.8  8.7   Total  Protein 6.5 - 8.1 g/dL 6.9  6.6    Total Bilirubin 0.0 - 1.2 mg/dL 0.8  0.5    Alkaline Phos 38 - 126 U/L 61  63    AST 15 - 41 U/L 20  17    ALT 0 - 44 U/L 11  9      Renal function Estimated Creatinine Clearance: 53.5 mL/min (by C-G formula based on SCr of 1.06 mg/dL).  Hgb A1c MFr Bld (%)  Date Value  10/01/2022 6.6 (H)    LDL Cholesterol (Calc)  Date Value Ref Range Status  04/29/2017 81 mg/dL (calc) Final    Comment:    Reference  range: <100 . Desirable range <100 mg/dL for primary prevention;   <70 mg/dL for patients with CHD or diabetic patients  with > or = 2 CHD risk factors. [name] LDL-C is now calculated using the Martin-Hopkins  calculation, which is a validated novel method providing  better accuracy than the Friedewald equation in the  estimation of LDL-C.  Horald Pollen et al. Lenox Ahr. 4098;119(14): 2061-2068  (http://education.[employer].com/faq/FAQ164)    LDL Cholesterol  Date Value Ref Range Status  10/02/2022 88 0 - 99 mg/dL Final    Comment:           Total Cholesterol/HDL:CHD Risk Coronary Heart Disease Risk Table                     Men   Women  1/2 Average Risk   3.4   3.3  Average Risk       5.0   4.4  2 X Average Risk   9.6   7.1  3 X Average Risk  23.4   11.0        Use the calculated Patient Ratio above and the CHD Risk Table to determine the patient's CHD Risk.        ATP III CLASSIFICATION (LDL):  <100     mg/dL   Optimal  782-956  mg/dL   Near or Above                    Optimal  130-159  mg/dL   Borderline  213-086  mg/dL   High  >578     mg/dL   Very High Performed at [facility], [address] [place], [place] [address]      +-------+-----------+-----------+------------+------------+  ABI/TBIToday's ABIToday's TBIPrevious ABIPrevious TBI  +-------+-----------+-----------+------------+------------+  Right 0.67       0                                    +-------+-----------+-----------+------------+------------+  Left  0.77       0.16                                 +-------+-----------+-----------+------------+------------+    Rande Brunt. Lenell Antu, MD [facility] Phone Number: [phone] 06/09/2023 4:56 PM   Total time spent on preparing this encounter including chart review, data review, collecting history, examining the patient, coordinating care for this new patient, 60 minutes.  Portions  of this report may have been transcribed using voice recognition software.  Every effort has been made to ensure accuracy; however, inadvertent computerized transcription errors may still be present.

## 2023-06-12 ENCOUNTER — Ambulatory Visit: Payer: PPO | Admitting: Internal Medicine

## 2023-06-15 ENCOUNTER — Encounter

## 2023-06-16 ENCOUNTER — Inpatient Hospital Stay: Payer: PPO

## 2023-06-16 ENCOUNTER — Encounter: Payer: Self-pay | Admitting: Oncology

## 2023-06-16 ENCOUNTER — Inpatient Hospital Stay (HOSPITAL_BASED_OUTPATIENT_CLINIC_OR_DEPARTMENT_OTHER): Payer: PPO | Admitting: Oncology

## 2023-06-16 VITALS — BP 109/64 | HR 81 | Temp 96.6°F | Resp 19 | Wt 133.3 lb

## 2023-06-16 VITALS — BP 133/71 | HR 70

## 2023-06-16 DIAGNOSIS — R569 Unspecified convulsions: Secondary | ICD-10-CM

## 2023-06-16 DIAGNOSIS — M549 Dorsalgia, unspecified: Secondary | ICD-10-CM

## 2023-06-16 DIAGNOSIS — Z5112 Encounter for antineoplastic immunotherapy: Secondary | ICD-10-CM

## 2023-06-16 DIAGNOSIS — C7951 Secondary malignant neoplasm of bone: Secondary | ICD-10-CM

## 2023-06-16 DIAGNOSIS — G8929 Other chronic pain: Secondary | ICD-10-CM

## 2023-06-16 DIAGNOSIS — G629 Polyneuropathy, unspecified: Secondary | ICD-10-CM | POA: Diagnosis not present

## 2023-06-16 DIAGNOSIS — C3412 Malignant neoplasm of upper lobe, left bronchus or lung: Secondary | ICD-10-CM

## 2023-06-16 LAB — COMPREHENSIVE METABOLIC PANEL WITH GFR
ALT: 9 U/L (ref 0–44)
AST: 17 U/L (ref 15–41)
Albumin: 3.4 g/dL — ABNORMAL LOW (ref 3.5–5.0)
Alkaline Phosphatase: 64 U/L (ref 38–126)
Anion gap: 8 (ref 5–15)
BUN: 16 mg/dL (ref 8–23)
CO2: 25 mmol/L (ref 22–32)
Calcium: 8.5 mg/dL — ABNORMAL LOW (ref 8.9–10.3)
Chloride: 98 mmol/L (ref 98–111)
Creatinine, Ser: 0.9 mg/dL (ref 0.61–1.24)
GFR, Estimated: >60 mL/min (ref >60)
Glucose, Bld: 131 mg/dL — ABNORMAL HIGH (ref 70–99)
Potassium: 3.9 mmol/L (ref 3.5–5.1)
Sodium: 131 mmol/L — ABNORMAL LOW (ref 135–145)
Total Bilirubin: 0.7 mg/dL (ref 0.0–1.2)
Total Protein: 6.6 g/dL (ref 6.5–8.1)

## 2023-06-16 LAB — CBC WITH DIFFERENTIAL/PLATELET
Abs Immature Granulocytes: 0.04 10*3/uL (ref 0.00–0.07)
Basophils Absolute: 0.1 10*3/uL (ref 0.0–0.1)
Basophils Relative: 1 %
Eosinophils Absolute: 0.7 10*3/uL — ABNORMAL HIGH (ref 0.0–0.5)
Eosinophils Relative: 8 %
HCT: 39.7 % (ref 39.0–52.0)
Hemoglobin: 12.6 g/dL — ABNORMAL LOW (ref 13.0–17.0)
Immature Granulocytes: 1 %
Lymphocytes Relative: 13 %
Lymphs Abs: 1.1 10*3/uL (ref 0.7–4.0)
MCH: 29.2 pg (ref 26.0–34.0)
MCHC: 31.7 g/dL (ref 30.0–36.0)
MCV: 92.1 fL (ref 80.0–100.0)
Monocytes Absolute: 0.7 10*3/uL (ref 0.1–1.0)
Monocytes Relative: 8 %
Neutro Abs: 6.1 10*3/uL (ref 1.7–7.7)
Neutrophils Relative %: 69 %
Platelets: 264 10*3/uL (ref 150–400)
RBC: 4.31 MIL/uL (ref 4.22–5.81)
RDW: 14.1 % (ref 11.5–15.5)
WBC: 8.6 10*3/uL (ref 4.0–10.5)
nRBC: 0 % (ref 0.0–0.2)

## 2023-06-16 LAB — TSH: TSH: 1.936 u[IU]/mL (ref 0.350–4.500)

## 2023-06-16 MED ORDER — PEMBROLIZUMAB CHEMO INJECTION 100 MG/4ML
200.0000 mg | Freq: Once | INTRAVENOUS | Status: AC
  Administered 2023-06-16: 200 mg via INTRAVENOUS
  Filled 2023-06-16: qty 8

## 2023-06-16 MED ORDER — SODIUM CHLORIDE 0.9% FLUSH
10.0000 mL | INTRAVENOUS | Status: DC | PRN
  Administered 2023-06-16: 10 mL
  Filled 2023-06-16: qty 10

## 2023-06-16 MED ORDER — SODIUM CHLORIDE 0.9 % IV SOLN
Freq: Once | INTRAVENOUS | Status: AC
  Filled 2023-06-16: qty 250

## 2023-06-16 MED ORDER — HEPARIN SOD (PORK) LOCK FLUSH 100 UNIT/ML IV SOLN
500.0000 [IU] | Freq: Once | INTRAVENOUS | Status: AC | PRN
  Administered 2023-06-16: 500 [IU]
  Filled 2023-06-16: qty 5

## 2023-06-16 NOTE — Assessment & Plan Note (Signed)
 Back pain, MRI lumbar showed DJD, L4-L5 synovial cyst,PET scan showed L4 fracture Continue PRN percocet 7.5mg /325mg , fentanyl patch Q72 hours

## 2023-06-16 NOTE — Assessment & Plan Note (Signed)
 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  S/p lung radiation to left lung.  January 2025, CT and PET scan results were reviewed. Stable disease. No progression or new disease.  Labs are reviewed and discussed with patient. I recommend to continue [facility]

## 2023-06-16 NOTE — Assessment & Plan Note (Signed)
 Fingertip neuropathy intermittent Grade 1, feet neuropathy persistent, Grade 2.  On Gabapentin 300mg  TID

## 2023-06-16 NOTE — Assessment & Plan Note (Addendum)
 Previously on Zometa every 4- 6 weeks.-  previously on hold due to back decompression surgery [02/09/2023]. Continue calcium supplementation He reports feeling weak after receiving zometa. Plan to switch to Xgeva

## 2023-06-16 NOTE — Progress Notes (Signed)
 Hematology/Oncology Progress note [phone] [phone]       CHIEF COMPLAINTS/REASON FOR VISIT:  Metastatic non-small cell lung cancer  ASSESSMENT & PLAN:   Cancer Staging  Primary non-small cell carcinoma of upper lobe of left lung (HCC) Staging form: Lung, AJCC 8th Edition - Clinical: Stage IV (cT2b, cN1, cM1) - Signed by [name], MD on 08/06/2021   Primary non-small cell carcinoma of upper lobe of left lung (HCC) Stage IV lung adenocarcinoma with brain and bone metastasis.  S/p lung radiation to left lung.  January 2025, CT and PET scan results were reviewed. Stable disease. No progression or new disease.  Labs are reviewed and discussed with patient. I recommend to continue Keytruda   Encounter for antineoplastic immunotherapy Immunotherapy plan as listed above  Metastasis to bone [facility]) Previously on Zometa every 4- 6 weeks.-  previously on hold due to back decompression surgery [02/09/2023]. Continue calcium supplementation He reports feeling weak after receiving zometa. Plan to switch to Xgeva   Neuropathy Fingertip neuropathy intermittent Grade 1, feet neuropathy persistent, Grade 2.  On Gabapentin 300mg  TID     Back pain Back pain, MRI lumbar showed DJD, L4-L5 synovial cyst,PET scan showed L4 fracture Continue PRN percocet 7.5mg /325mg , fentanyl patch Q72 hours     Seizure (HCC) On Keppra.  Recommend patient to follow up with neurologist.    Follow up 3 weeks All questions were answered. The patient knows to call the clinic with any problems, questions or concerns.  [name], MD, [name] [facility] Hematology Oncology 06/16/2023      HISTORY OF PRESENTING ILLNESS:   Micheal Donovan is a  73 y.o.  male presents for follow up of Non-small cell lung cancer.  Oncology History Overview Note  Diagnosis: Stage IIB T2b N1 M0 adenocarcinoma of the LUL, poorly differentiated     Primary non-small cell carcinoma of upper lobe of left  lung (HCC)  06/13/2021 Initial Diagnosis   Primary non-small cell carcinoma of upper lobe of left lung [facility]) -06/20/2021 - 06/27/2021, patient presented to [facility] due to progressive headache/dizziness/gait changes. CT head showed acute to subacute intraparenchymal hemorrhage involving the left cerebellum.  Surrounding low-density vasogenic edema.  Patient was transferred to [facility]. This was further evaluated by CT angiogram of the neck which showed bulky calcified plaques/stenosis of carotid artery, Followed by MRI brain. 06/12/2021, MRI of the brain showed no significant interval change in size of the left cerebellar intraparenchymal hematoma with unchanged regional mass effect and partial effacement of fourth ventricle but no upstream hydrocephalus. There is no discernible enhancement to suggest underlying mass lesion, though acute blood products could mask enhancement.   06/12/2021 a chest x-ray showed a 4.4 cm left middle lobe. 06/13/2021, CT chest with contrast showed a 4.3 x 3.6 x 3.3 cm lobular spiculated mass in the posterior left upper lobe with T3 to the lateral pleura and major fissure.  Metastatic left hilar lymphadenopathy.  Peripheral micronodularity posterior right costophrenic sulcus.  Aortic atherosclerosis. 06/14/2021, CT abdomen pelvis showed right lower lobe pulmonary artery embolus.  No evidence of right heart strain.  No acute intra-abdominal or pelvic pathology.  Aortic atherosclerosis.  06/13/2021, patient underwent bronchoscopy with EBUS by Dr. Katrinka Blazing.  Biopsy from the fine-needle aspiration of station 11 mL showed malignant cells, consistent with poorly differentiated non-small cell carcinoma, consistent with adenocarcinoma.  Malignant cells are TTF-1 positive and negative for p40.  Negative for neuroendocrine markers.  Blood test and tissue sample were  tested for Gardant 360  -PD-L1 TPS 97%, no actionable mutation on the blood testing. Tissue molecular testing  showed [id number] mutation.   07/17/2021 Cancer Staging   Staging form: Lung, AJCC 8th Edition - Clinical: Stage IV (cT2b, cN1, cM1) - Signed by [name], MD on 08/06/2021   08/06/2021 Imaging   I saw patient's PET scan after his visit with me on 08/06/21. PET scan was ordered by his previous oncologist group and did not come to my in basket.. Patient received cycle 1 carboplatin Taxol today.  He has started on radiation.  5/26/3 PET scan showed 3.8 cm hypermetabolic left upper lobe mass with hypermetabolic left hilar and infrahilar adenopathy.  There is approximately 8 scattered metastatic lesions in the skeleton. Mixed density photopenic lesion in the left cerebellum. characterized as hemorrhage on recent prior imaging workups.    08/16/2021 -  Chemotherapy   Patient is on Treatment Plan : LUNG Carboplatin (5) + Pemetrexed + Pembrolizumab (200) D1 q21d Induction x 4 cycles / Maintenance Pemetrexed + Pembrolizumab (200) D1 q21d      08/22/2021 Imaging   MRI brain w wo contrast  Decrease in size of left cerebellar hematoma with resolution of edema. No evidence of underlying lesion. Smaller, more recent hemorrhage in the left cerebellar vermis with minimal edema. There is minimal enhancement without definite evidence of underlying lesion.  New punctate focus of chronic blood products and enhancement in the left frontal lobe. Additional new foci of chronic blood products in the posterior right putamen, right parietal subcortical white matter, and posterior right cerebellum. Unclear at this time if these represent foci of bland hemorrhage or early metastases.   Increase in size of right parietal osseous metastasis with minor extraosseous extension.     Imaging   PET scan showed 1. Interval response to therapy as evidenced by a small residual left upper lobe nodule with decreased hypermetabolism, no residual hypermetabolic adenopathy and decreased hypermetabolism associatedwith osseous  metastases. 2. 6 mm posterior left upper lobe nodule, likely stable. Recommend attention on follow-up. 3. Aortic atherosclerosis (ICD10-I70.0). Coronary artery calcification.   12/17/2021 Imaging   MRI lumbar spine w wo contrast  1. No evidence of regional metastatic disease. 2. Late subacute superior endplate fracture at L2 and large superior endplate Schmorl's node at L5, unchanged since the PET scan of 10/29/2021. 3. L3-4: Shallow disc protrusion. Facet and ligamentous hypertrophy. Stenosis of both lateral recesses. Findings slightly worsened since 2022. 4. L4-5: Shallow disc protrusion. Facet and ligamentous hypertrophy. Small synovial cyst arising from the facet joint on the right. Stenosis of the lateral recesses right worse than left. Foraminal narrowing right worse than left. Findings have worsened since 2022.  5. L5-S1: Endplate osteophytes and shallow protrusion of the disc. Facet and ligamentous hypertrophy. Stenosis of the subarticular lateral recesses and neural foramina, right worse than left. Similar appearance to the study of 2022. 6. Continued evidence of enteritis of at least 1 loop of small bowel. Distended bladder present   12/18/2021 Imaging   Brian MRI w wo  1. Interval decrease in size of the left cerebellar and vermian hematomas. No definite evidence of underlying metastatic lesion. 2. There is are two possible contrast enhancing lesions in the posterior left frontal lobe and left occipital lobe, which do not have intrinsic T1 signal abnormality, but have a somewhat linear appearance and may be vascular in nature. Recommend attention on follow up. 3. Multifocal sites of susceptibility artifact are redemonstrated with interval development of a few  new foci, as described above. All of these sites demonstrate intrinsic T1 hyperintense signal abnormality and susceptibility artifact and are compatible with sites of microhemorrhages. Recommend continued attention on  follow up. 4. Interval decrease in size of right parietal calvarial metastatic lesion. No new contrast enhancing lesions visualized. 5. Diffusely heterogeneous marrow signal throughout the cervical spine, which is nonspecific but can be seen in the setting of anemia, smoking, obesity, or a marrow replacement process.     12/18/2021 Imaging   MRI lumbar spine w wo contrast  1. No evidence of regional metastatic disease. 2. Late subacute superior endplate fracture at L2 and large superior endplate Schmorl's node at L5, unchanged since the PET scan of 10/29/2021 3. L3-4: Shallow disc protrusion. Facet and ligamentous hypertrophy.Stenosis of both lateral recesses. Findings slightly worsened since 2022. 4. L4-5: Shallow disc protrusion. Facet and ligamentous hypertrophy. Small synovial cyst arising from the facet joint on the right. Stenosis of the lateral recesses right worse than left. Foraminal narrowing right worse than left. Findings have worsened since 2022. 5. L5-S1: Endplate osteophytes and shallow protrusion of the disc. Facet and ligamentous hypertrophy. Stenosis of the subarticular lateral recesses and neural foramina, right worse than left. Similar appearance to the study of 2022. 6. Continued evidence of enteritis of at least 1 loop of small bowel. Distended bladder present.       01/28/2022 Imaging   CT chest abdomen pelvis w contrast 1. Treated left upper lobe mass appears grossly stable to the prior examination when measured in a similar fashion on the prior study. No definite signs of extra skeletal metastatic disease noted in the chest, abdomen or pelvis. 2. Widespread skeletal metastases redemonstrated, as above. 3. Hepatic steatosis. 4. Aortic atherosclerosis, in addition to left main and three-vessel coronary artery disease. Assessment for potential risk factor modification, dietary therapy or pharmacologic therapy may be warranted, if clinically indicated. 5. Additional  incidental findings,    04/08/2022 Imaging   MRI lumbar spine w wo contrast  1. Stable remote superior endplate fracture of L2 and large Schmorl to noted involving L5. No worrisome bone lesions to suggest metastatic disease. 2. Degenerative lumbar spondylosis with multilevel disc disease and facet disease which appears relatively stable as detailed above. 3. Enlarging right-sided synovial cyst at L4-5 with progressive mass effect on the right side of the thecal sac. This could potentially be symptomatic   04/28/2022 Imaging   CT chest abdomen pelvis wo contrast  1. Similar versus slightly decreased size of treated left upper lobe primary bronchogenic carcinoma. 2. Similar osseous metastasis. 3. No evidence of extraosseous metastatic disease. 4. New small right pleural effusion. 5. Coronary artery atherosclerosis. Aortic Atherosclerosis   07/03/2022 Imaging   PET scan showed 1. Interval development of a small focus of intense hypermetabolism within the left upper lobe scar. This is associated with a 5 mm perifissural nodule in the left lung which is hypermetabolic. Hypermetabolism at both of these locations is new since prior PET-CT and highly suspicious for recurrent disease. 2. No evidence for hypermetabolic soft tissue metastatic disease in the neck, abdomen, or pelvis. 3. Similar appearance of sclerotic and lytic bone lesions without hypermetabolism on PET imaging. 4. Focal hypermetabolism associated with a fracture of the right L4 transverse process. The fracture was visible on the previous CT of 04/28/2022. No associated soft tissue lesion to suggest that this is pathologic in nature. Tracer accumulation on today's study is in keeping with healing fracture. There is also some minimal uptake in a  prominent spur associated with the L4 superior endplate, most likely degenerative. 5.  Aortic Atherosclerosis    08/01/2022 - 08/14/2022 Radiation Therapy   Radiation to left lung.    10/01/2022  Imaging   chest angiogram PE protocol  No pulmonary embolism identified.   Stable nodular opacity in the left upper lobe. Please correlate for history of treated lung cancer. There is an adjacent small nodule along the course of the interlobar fissure which is slightly larger today compared to the study of February 2024. please correlate with findings of the recent PET-CT of 07/03/2022.      10/02/2022 Imaging   MRI brain with and without contrast 1. No acute intracranial abnormality. No evidence for intraparenchymal metastatic disease. 2. Chronic left cerebellar hemorrhage with multiple additional chronic micro hemorrhages elsewhere throughout the brain, stable. 3. Underlying mild chronic microvascular ischemic disease.    10/02/2022 Imaging   MRI lumbar spine showed 1. No MRI evidence for acute infection or other abnormality within the lumbar spine. 2. Few subcentimeter T1 hypointense lesions about the visualized posterior iliac wings. These correspond with small sclerotic foci seen on most recent CT from 04/08/2022, presumably reflecting patient's known osseous metastatic disease. These were not hypermetabolic on prior PET-CT from 16/12/9602. 3. No other evidence for metastatic disease within the lumbar spine itself. 4. Chronic L2 compression fracture, stable. Large Schmorl's node deformity with associated height loss at L5, also stable. 5. Multifactorial degenerative changes at L3-4 through L5-S1 with resultant mild to moderate bilateral subarticular stenosis, with mild to moderate bilateral L3 through L5 foraminal narrowing as above.   12/24/2022 Imaging   CT chest w contrast   1. Stable appearance of treated lung lesion within the left upper lobe with surrounding scarring and architectural distortion. 2. The previous FDG avid subpleural nodule abutting the oblique fissure of the left lung (adjacent to post treatment changes) is again noted measuring 7 mm. This is stable from  10/01/2022. On the PET-CT from 07/03/2022 this nodule was measured at 5 mm. 3. New bandlike area of subpleural consolidation within the lateral and posterior left lower lobe is identified. This is favored to represent post treatment change. 4. Stable sclerotic lesions involving the T12 and L1 vertebra. 5. Coronary artery calcifications. 6.  Aortic Atherosclerosis    03/06/2023 Imaging   CT chest abdomen pelvis wo contrast showed  Increasing opacity along the lingula with the increasing nodular central component compared to the recent CT scan.   There is also some changing opacities in the left lower lobe. The larger more peripheral area in the left lower lobe is decreasing with some new areas of subtle ground-glass more caudal. Attention on follow-up.   No developing new other mass lesion or nodal enlargement.   Scattered sclerotic bone lesions has a similar distribution to previous.   Overall evaluation for solid organ pathology including solid organ metastases are limited without the advantage of IV contrast.     03/23/2023 Imaging   PET scan showed 1. Post radiation consolidation in the lingula with mild residual focal hypermetabolism, overall improved from 07/03/2022. No evidence of disease progression. 2. Quiescent osseous metastatic disease. 3. Aortic atherosclerosis (ICD10-I70.0). Coronary artery calcification.   # Patient has a history of melanoma on his neck, treated in 2009. # History of hemorrhagic infarct left cerebellum  10/01/2022 - 10/06/2022 patient was admitted to the hospital due to lower extremity weakness and intermittent aphasia. Patient reports acute on chronic left lower extremity weakness, weakness has been more prominent over  the past 2 weeks.  He reports significant episode of difficulty with ambulation 2 days after his last Keytruda treatments.  He reports that he felt loss of control of muscle at that time.  Patient reports that the weakness is different  than her chronic lower extremity  weakness.  Patient was found to have orthostatic hypotension Flomax irbesartan were held.  CK wnl, Neurology was consulted and started the patient on Keppra for possible seizure.  EEG was negative.  01/31/23 He recently presented to ER due to altered mental status and shaking episodes.  CT head wo negative. UA positive for nitrate and leukocyte esterase. Code sepsis was actived in ER and he was recommended for treatment of UTI.  He declined admission and went home with Keflex 500mg  4 times daily for 10 days.  Lower extremity weakness localizes best to lumbosacral nerve roots, known spondylosis with multiple foci of neuro-foraminal stenosis.  02/09/2023 S/p decompression neurosurgery   04/17/2023 - 04/21/2023 hospitalized due to seizure, and UTI he was loaded with Keppra. Seen by neurology in office. UTI was treated with IV rocephin, discharged on Cipro.  INTERVAL HISTORY Micheal Donovan is a 72 y.o. male who has above history reviewed by me today presents for follow up visit for management of  Stage IV lung adenocarcinoma Accompanied by care give.   + back pain, and shoulder and knee pain, on fentanyl patch Q72 hours and percocet  Today he reports feeling at baseline.  No new complaints.   Review of Systems  Constitutional:  Positive for fatigue. Negative for appetite change, chills, fever and unexpected weight change.  HENT:   Negative for hearing loss and voice change.   Eyes:  Negative for eye problems and icterus.  Respiratory:  Positive for shortness of breath. Negative for chest tightness and cough.   Cardiovascular:  Positive for leg swelling. Negative for chest pain.  Gastrointestinal:  Negative for abdominal distention, abdominal pain and blood in stool.  Endocrine: Negative for hot flashes.  Genitourinary:  Negative for difficulty urinating, dysuria and frequency.   Musculoskeletal:  Positive for back pain. Negative for arthralgias.        Chronic lower extremity weakness  Skin:  Negative for itching and rash.  Neurological:  Negative for extremity weakness, headaches, light-headedness and numbness.  Hematological:  Negative for adenopathy. Does not bruise/bleed easily.  Psychiatric/Behavioral:  Positive for sleep disturbance. Negative for confusion.     MEDICAL HISTORY:  Past Medical History:  Diagnosis Date   Allergy    Anxiety    BPH (benign prostatic hypertrophy)    Cancer (HCC)    Melanoma on Neck    2008   Carotid artery occlusion    CHF (congestive heart failure) (HCC)    Diabetes mellitus    type 2   ED (erectile dysfunction)    GERD (gastroesophageal reflux disease)    Hemorrhagic stroke (HCC) 06/2021   Hyperlipidemia    Hypertension    Lung cancer (HCC)    Neoplasm related pain    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)    Stroke [facility])     SURGICAL HISTORY: Past Surgical History:  Procedure Laterality Date   ANTERIOR CERVICAL DECOMP/DISCECTOMY FUSION N/A 02/09/2023   Procedure: C3-5 ANTERIOR CERVICAL DISCECTOMY AND FUSION;  Surgeon: Venetia Night, MD;  Location: [facility] ORS;  Service: Neurosurgery;  Laterality: N/A;   BRONCHIAL NEEDLE ASPIRATION BIOPSY  06/17/2021   Procedure: BRONCHIAL NEEDLE ASPIRATION BIOPSIES;  Surgeon: Lorin Glass, MD;  Location: [facility]  ENDOSCOPY;  Service: Pulmonary;;   IR IMAGING GUIDED PORT INSERTION  08/05/2021   MELANOMA EXCISION  2008   Left side of neck   RADIOLOGY WITH ANESTHESIA N/A 04/05/2020   Procedure: MRI SPINE WITOUT CONTRAST;  Surgeon: Radiologist, Medication, MD;  Location: [facility] OR;  Service: Radiology;  Laterality: N/A;   RADIOLOGY WITH ANESTHESIA N/A 08/22/2021   Procedure: MRI BRAIN WITH AND WITHOUT CONTRAST  WITH ANESTHESIA;  Surgeon: Radiologist, Medication, MD;  Location: [facility] OR;  Service: Radiology;  Laterality: N/A;   RADIOLOGY WITH ANESTHESIA N/A 12/17/2021   Procedure: MRI BRAIN WITH AND WITHOUT CONTRAST WITH ANESTHESIA; MRI LUMBER WITH AND WITHOUT CONTRAST;   Surgeon: Radiologist, Medication, MD;  Location: [facility] OR;  Service: Radiology;  Laterality: N/A;   RADIOLOGY WITH ANESTHESIA N/A 04/08/2022   Procedure: MRI LUMBER SPINE WITH AND WITHOUT CONTRAST;  Surgeon: Radiologist, Medication, MD;  Location: [facility] OR;  Service: Radiology;  Laterality: N/A;   TONSILLECTOMY     VIDEO BRONCHOSCOPY WITH ENDOBRONCHIAL ULTRASOUND N/A 06/17/2021   Procedure: VIDEO BRONCHOSCOPY WITH ENDOBRONCHIAL ULTRASOUND;  Surgeon: Josiah Nigh, MD;  Location: [facility] ENDOSCOPY;  Service: Pulmonary;  Laterality: N/A;    SOCIAL HISTORY: Social History   Socioeconomic History   Marital status: Married    Spouse name: Not on file   Number of children: Not on file   Years of education: Not on file   Highest education level: Associate degree: occupational, Scientist, product/process development, or vocational program  Occupational History   Not on file  Tobacco Use   Smoking status: Former    Current packs/day: 0.00    Types: Cigarettes    Quit date: 07/21/1990    Years since quitting: 32.9   Smokeless tobacco: Never  Vaping Use   Vaping status: Never Used  Substance and Sexual Activity   Alcohol use: No   Drug use: No   Sexual activity: Not Currently  Other Topics Concern   Not on file  Social History Narrative   Not on file   Social Drivers of Health   Financial Resource Strain: Low Risk  (05/27/2023)   Overall Financial Resource Strain (CARDIA)    Difficulty of Paying Living Expenses: Not very hard  Food Insecurity: No Food Insecurity (05/27/2023)   Hunger Vital Sign    Worried About Running Out of Food in the Last Year: Never true    Ran Out of Food in the Last Year: Never true  Transportation Needs: No Transportation Needs (05/27/2023)   PRAPARE - Administrator, Civil Service (Medical): No    Lack of Transportation (Non-Medical): No  Physical Activity: Unknown (05/27/2023)   Exercise Vital Sign    Days of Exercise per Week: 0 days    Minutes of Exercise per Session: Not on file   Stress: Stress Concern Present (05/27/2023)   [employer] of Occupational Health - Occupational Stress Questionnaire    Feeling of Stress : To some extent  Social Connections: Unknown (05/27/2023)   Social Connection and Isolation Panel [NHANES]    Frequency of Communication with Friends and Family: Once a week    Frequency of Social Gatherings with Friends and Family: Patient declined    Attends Religious Services: Patient unable to answer    Active Member of Clubs or Organizations: No    Attends Banker Meetings: Patient unable to answer    Marital Status: Married  Catering manager Violence: Not At Risk (04/22/2023)   Humiliation, Afraid, Rape, and Kick questionnaire    Fear  of Current or Ex-Partner: No    Emotionally Abused: No    Physically Abused: No    Sexually Abused: No    FAMILY HISTORY: Family History  Problem Relation Age of Onset   COPD Mother    Clotting disorder Mother    Heart disease Father    Heart disease Brother        MI at age 2   Clotting disorder Maternal Grandmother    Stroke Neg Hx     ALLERGIES:  is allergic to niaspan [niacin].  MEDICATIONS:  Current Outpatient Medications  Medication Sig Dispense Refill   ALPRAZolam (XANAX) 0.5 MG tablet Take 1 tablet (0.5 mg total) by mouth at bedtime as needed for anxiety. 15 tablet 0   celecoxib (CELEBREX) 200 MG capsule Take 1 capsule (200 mg total) by mouth 2 (two) times daily. 60 capsule 0   citalopram (CELEXA) 10 MG tablet Take 1 tablet (10 mg total) by mouth daily. 30 tablet 3   docusate sodium (COLACE) 100 MG capsule Take 100 mg by mouth 2 (two) times daily as needed for mild constipation.     fentaNYL (DURAGESIC) 75 MCG/HR Place 1 patch onto the skin every 3 (three) days. 10 patch 0   gabapentin (NEURONTIN) 300 MG capsule Take 2 capsules (600 mg total) by mouth 2 (two) times daily. 120 capsule 2   insulin glargine (LANTUS) 100 UNIT/ML injection Inject 0-30 Units into the skin daily as  needed (High Blood Sugar over 150).     Insulin Pen Needle (B-D UF III MINI PEN NEEDLES) 31G X 5 MM MISC Use to inject insulin daily. 100 each 3   levETIRAcetam (KEPPRA) 500 MG tablet Take 1 tablet (500 mg total) by mouth 2 (two) times daily. 180 tablet 3   megestrol (MEGACE) 40 MG tablet Take 1 tablet (40 mg total) by mouth daily. 30 tablet 3   methocarbamol (ROBAXIN) 500 MG tablet Take 1 tablet (500 mg total) by mouth every 6 (six) hours as needed for muscle spasms. 120 tablet 0   NOVOLOG FLEXPEN 100 UNIT/ML FlexPen Inject 0-10 Units into the skin daily as needed for high blood sugar (BG > 200).     Nystatin (GERHARDT'S BUTT CREAM) CREA Apply 14 Applications topically 2 (two) times daily. 1 each 0   omeprazole (PRILOSEC) 20 MG capsule Take 20 mg by mouth daily.     oxyCODONE-acetaminophen (PERCOCET) 7.5-325 MG tablet Take 1 tablet by mouth every 4 (four) hours as needed for severe pain (pain score 7-10). Do not take with cough medication or other pain medication 60 tablet 0   senna (SENOKOT) 8.6 MG TABS tablet Take 1 tablet (8.6 mg total) by mouth 2 (two) times daily as needed for mild constipation. 30 tablet 0   tamsulosin (FLOMAX) 0.4 MG CAPS capsule Take 1 capsule (0.4 mg total) by mouth daily. 30 capsule 2   No current facility-administered medications for this visit.   Facility-Administered Medications Ordered in Other Visits  Medication Dose Route Frequency Provider Last Rate Last Admin   heparin lock flush 100 UNIT/ML injection            sodium chloride flush (NS) 0.9 % injection 10 mL  10 mL Intracatheter PRN [name], MD   10 mL at 06/16/23 1551     PHYSICAL EXAMINATION:  Vitals:   06/16/23 1343  BP: 109/64  Pulse: 81  Resp: 19  Temp: (!) 96.6 F (35.9 C)  SpO2: 97%   Filed Weights  06/16/23 1343  Weight: 133 lb 4.8 oz (60.5 kg)    Physical Exam Constitutional:      General: He is not in acute distress.    Appearance: He is ill-appearing.  HENT:     Head:  Normocephalic and atraumatic.  Eyes:     General: No scleral icterus. Cardiovascular:     Rate and Rhythm: Normal rate and regular rhythm.     Heart sounds: Normal heart sounds.  Pulmonary:     Effort: Pulmonary effort is normal. No respiratory distress.     Breath sounds: No wheezing.     Comments: Decreased breath sound bilaterally. Abdominal:     General: Bowel sounds are normal. There is no distension.     Palpations: Abdomen is soft.  Musculoskeletal:        General: No deformity. Normal range of motion.     Cervical back: Normal range of motion and neck supple.     Comments: Bilateral ankle trace edema.  Skin:    General: Skin is warm and dry.     Findings: No rash.  Neurological:     Mental Status: He is alert. Mental status is at baseline.     Cranial Nerves: No cranial nerve deficit.     Comments: Chronic left-sided weakness  Psychiatric:        Mood and Affect: Mood normal.     LABORATORY DATA:  I have reviewed the data as listed    Latest Ref Rng & Units 06/16/2023    1:28 PM 05/26/2023   12:55 PM 05/05/2023   12:56 PM  CBC  WBC 4.0 - 10.5 K/uL 8.6  6.5  8.3   Hemoglobin 13.0 - 17.0 g/dL 09.8  11.9  14.7   Hematocrit 39.0 - 52.0 % 39.7  38.4  38.6   Platelets 150 - 400 K/uL 264  302  239       Latest Ref Rng & Units 06/16/2023    1:28 PM 05/26/2023   12:55 PM 05/05/2023   12:56 PM  CMP  Glucose 70 - 99 mg/dL 829  562  130   BUN 8 - 23 mg/dL 16  16  23    Creatinine 0.61 - 1.24 mg/dL 8.65  7.84  6.96   Sodium 135 - 145 mmol/L 131  136  134   Potassium 3.5 - 5.1 mmol/L 3.9  3.9  4.3   Chloride 98 - 111 mmol/L 98  101  99   CO2 22 - 32 mmol/L 25  24  26    Calcium 8.9 - 10.3 mg/dL 8.5  8.8  8.8   Total Protein 6.5 - 8.1 g/dL 6.6  6.9  6.6   Total Bilirubin 0.0 - 1.2 mg/dL 0.7  0.8  0.5   Alkaline Phos 38 - 126 U/L 64  61  63   AST 15 - 41 U/L 17  20  17    ALT 0 - 44 U/L 9  11  9       Iron/TIBC/Ferritin/ %Sat    Component Value Date/Time   IRON 60  02/03/2022 0958   TIBC 391 02/03/2022 0958   FERRITIN 225 04/19/2023 0648   IRONPCTSAT 15 (L) 02/03/2022 0958      RADIOGRAPHIC STUDIES: I have personally reviewed the radiological images as listed and agreed with the findings in the report. DG PAIN CLINIC C-ARM 1-60 MIN NO REPORT Result Date: 06/03/2023 Fluoro was used, but no Radiologist interpretation will be provided. Please refer to "NOTES" tab for provider  progress note.

## 2023-06-16 NOTE — Assessment & Plan Note (Signed)
 On Keppra.  Recommend patient to follow up with neurologist.

## 2023-06-16 NOTE — Patient Instructions (Signed)
 [facility] - A DEPT OF [facility]  Discharge Instructions: Thank you for choosing [facility] to provide your oncology and hematology care.  If you have a lab appointment with the Cancer Center, please go directly to the Cancer Center and check in at the registration area.  Wear comfortable clothing and clothing appropriate for easy access to any Portacath or PICC line.   We strive to give you quality time with your provider. You may need to reschedule your appointment if you arrive late (15 or more minutes).  Arriving late affects you and other patients whose appointments are after yours.  Also, if you miss three or more appointments without notifying the office, you may be dismissed from the clinic at the provider's discretion.      For prescription refill requests, have your pharmacy contact our office and allow 72 hours for refills to be completed.    Today you received the following chemotherapy and/or immunotherapy agents [name]      To help prevent nausea and vomiting after your treatment, we encourage you to take your nausea medication as directed.  BELOW ARE SYMPTOMS THAT SHOULD BE REPORTED IMMEDIATELY: *FEVER GREATER THAN 100.4 F (38 C) OR HIGHER *CHILLS OR SWEATING *NAUSEA AND VOMITING THAT IS NOT CONTROLLED WITH YOUR NAUSEA MEDICATION *UNUSUAL SHORTNESS OF BREATH *UNUSUAL BRUISING OR BLEEDING *URINARY PROBLEMS (pain or burning when urinating, or frequent urination) *BOWEL PROBLEMS (unusual diarrhea, constipation, pain near the anus) TENDERNESS IN MOUTH AND THROAT WITH OR WITHOUT PRESENCE OF ULCERS (sore throat, sores in mouth, or a toothache) UNUSUAL RASH, SWELLING OR PAIN  UNUSUAL VAGINAL DISCHARGE OR ITCHING   Items with * indicate a potential emergency and should be followed up as soon as possible or go to the Emergency Department if any problems should occur.  Please show the CHEMOTHERAPY ALERT CARD or IMMUNOTHERAPY  ALERT CARD at check-in to the Emergency Department and triage nurse.  Should you have questions after your visit or need to cancel or reschedule your appointment, please contact [facility] - A DEPT OF [facility]  [phone] and follow the prompts.  Office hours are 8:00 a.m. to 4:30 p.m. Monday - Friday. Please note that voicemails left after 4:00 p.m. may not be returned until the following business day.  We are closed weekends and major holidays. You have access to a nurse at all times for urgent questions. Please call the main number to the clinic [phone] and follow the prompts.  For any non-urgent questions, you may also contact your provider using MyChart. We now offer e-Visits for anyone 48 and older to request care online for non-urgent symptoms. For details visit [email or web address].   Also download the MyChart app! Go to the app store, search "MyChart", open the app, select [facility], and log in with your MyChart username and password.

## 2023-06-16 NOTE — Assessment & Plan Note (Signed)
 Immunotherapy plan as listed above

## 2023-06-17 LAB — T4: T4, Total: 7.1 ug/dL (ref 4.5–12.0)

## 2023-06-17 NOTE — Telephone Encounter (Signed)
 Message received from [employer] portal regarding wheelchair cushion order:  Does pt have a wc and if so what kind of equipment is it and when was it billed to insurance? If no prior wc he is not eligible for this equipment. Thanks!   Responded back to [employer] informing them the information requested is not available.

## 2023-06-18 ENCOUNTER — Encounter: Payer: Self-pay | Admitting: Physician Assistant

## 2023-06-18 ENCOUNTER — Ambulatory Visit: Admitting: Physician Assistant

## 2023-06-18 VITALS — BP 105/70 | HR 85 | Ht 67.0 in | Wt 133.0 lb

## 2023-06-18 DIAGNOSIS — R399 Unspecified symptoms and signs involving the genitourinary system: Secondary | ICD-10-CM | POA: Diagnosis not present

## 2023-06-18 LAB — BLADDER SCAN AMB NON-IMAGING

## 2023-06-18 NOTE — Progress Notes (Signed)
 06/18/2023 4:07 PM   Micheal Donovan 1950/10/27 [id number]  CC: Chief Complaint  Patient presents with   Follow-up   HPI: Micheal Donovan is a 73 y.o. male with PMH non-small cell lung cancer, pyuria, and LUTS with nonobstructive prostate on cystoscopy 2 weeks ago on Flomax who presents today for PVR.   Today he reports no significant change in his voiding symptoms.  Bladder scan on arrival 276 mL and a condom catheter is in place.  He was prompted to void but was unable to do so.  PMH: Past Medical History:  Diagnosis Date   Allergy    Anxiety    BPH (benign prostatic hypertrophy)    Cancer (HCC)    Melanoma on Neck    2008   Carotid artery occlusion    CHF (congestive heart failure) (HCC)    Diabetes mellitus    type 2   ED (erectile dysfunction)    GERD (gastroesophageal reflux disease)    Hemorrhagic stroke (HCC) 06/2021   Hyperlipidemia    Hypertension    Lung cancer (HCC)    Neoplasm related pain    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)    Stroke [facility])     Surgical History: Past Surgical History:  Procedure Laterality Date   ANTERIOR CERVICAL DECOMP/DISCECTOMY FUSION N/A 02/09/2023   Procedure: C3-5 ANTERIOR CERVICAL DISCECTOMY AND FUSION;  Surgeon: Venetia Night, MD;  Location: [facility] ORS;  Service: Neurosurgery;  Laterality: N/A;   BRONCHIAL NEEDLE ASPIRATION BIOPSY  06/17/2021   Procedure: BRONCHIAL NEEDLE ASPIRATION BIOPSIES;  Surgeon: Lorin Glass, MD;  Location: [facility] ENDOSCOPY;  Service: Pulmonary;;   IR IMAGING GUIDED PORT INSERTION  08/05/2021   MELANOMA EXCISION  2008   Left side of neck   RADIOLOGY WITH ANESTHESIA N/A 04/05/2020   Procedure: MRI SPINE WITOUT CONTRAST;  Surgeon: Radiologist, Medication, MD;  Location: [facility] OR;  Service: Radiology;  Laterality: N/A;   RADIOLOGY WITH ANESTHESIA N/A 08/22/2021   Procedure: MRI BRAIN WITH AND WITHOUT CONTRAST  WITH ANESTHESIA;  Surgeon: Radiologist, Medication, MD;  Location: [facility] OR;  Service: Radiology;   Laterality: N/A;   RADIOLOGY WITH ANESTHESIA N/A 12/17/2021   Procedure: MRI BRAIN WITH AND WITHOUT CONTRAST WITH ANESTHESIA; MRI LUMBER WITH AND WITHOUT CONTRAST;  Surgeon: Radiologist, Medication, MD;  Location: [facility] OR;  Service: Radiology;  Laterality: N/A;   RADIOLOGY WITH ANESTHESIA N/A 04/08/2022   Procedure: MRI LUMBER SPINE WITH AND WITHOUT CONTRAST;  Surgeon: Radiologist, Medication, MD;  Location: [facility] OR;  Service: Radiology;  Laterality: N/A;   TONSILLECTOMY     VIDEO BRONCHOSCOPY WITH ENDOBRONCHIAL ULTRASOUND N/A 06/17/2021   Procedure: VIDEO BRONCHOSCOPY WITH ENDOBRONCHIAL ULTRASOUND;  Surgeon: Lorin Glass, MD;  Location: [facility] ENDOSCOPY;  Service: Pulmonary;  Laterality: N/A;    Home Medications:  Allergies as of 06/18/2023       Reactions   Niaspan [niacin]    FLUSHING        Medication List        Accurate as of June 18, 2023  4:07 PM. If you have any questions, ask your nurse or doctor.          ALPRAZolam 0.5 MG tablet Commonly known as: XANAX Take 1 tablet (0.5 mg total) by mouth at bedtime as needed for anxiety.   B-D UF III MINI PEN NEEDLES 31G X 5 MM Misc Generic drug: Insulin Pen Needle Use to inject insulin daily.   celecoxib 200 MG capsule Commonly known as: CeleBREX Take 1  capsule (200 mg total) by mouth 2 (two) times daily.   citalopram 10 MG tablet Commonly known as: CELEXA Take 1 tablet (10 mg total) by mouth daily.   docusate sodium 100 MG capsule Commonly known as: COLACE Take 100 mg by mouth 2 (two) times daily as needed for mild constipation.   fentaNYL 75 MCG/HR Commonly known as: DURAGESIC Place 1 patch onto the skin every 3 (three) days.   gabapentin 300 MG capsule Commonly known as: NEURONTIN Take 2 capsules (600 mg total) by mouth 2 (two) times daily.   Gerhardt's butt cream Crea Apply 14 Applications topically 2 (two) times daily.   insulin glargine 100 UNIT/ML injection Commonly known as: LANTUS Inject 0-30 Units into  the skin daily as needed (High Blood Sugar over 150).   levETIRAcetam 500 MG tablet Commonly known as: KEPPRA Take 1 tablet (500 mg total) by mouth 2 (two) times daily.   megestrol 40 MG tablet Commonly known as: MEGACE Take 1 tablet (40 mg total) by mouth daily.   methocarbamol 500 MG tablet Commonly known as: ROBAXIN Take 1 tablet (500 mg total) by mouth every 6 (six) hours as needed for muscle spasms.   NovoLOG FlexPen 100 UNIT/ML FlexPen Generic drug: insulin aspart Inject 0-10 Units into the skin daily as needed for high blood sugar (BG > 200).   omeprazole 20 MG capsule Commonly known as: PRILOSEC Take 20 mg by mouth daily.   oxyCODONE-acetaminophen 7.5-325 MG tablet Commonly known as: Percocet Take 1 tablet by mouth every 4 (four) hours as needed for severe pain (pain score 7-10). Do not take with cough medication or other pain medication   senna 8.6 MG Tabs tablet Commonly known as: SENOKOT Take 1 tablet (8.6 mg total) by mouth 2 (two) times daily as needed for mild constipation.   tamsulosin 0.4 MG Caps capsule Commonly known as: FLOMAX Take 1 capsule (0.4 mg total) by mouth daily.        Allergies:  Allergies  Allergen Reactions   Niaspan [Niacin]     FLUSHING    Family History: Family History  Problem Relation Age of Onset   COPD Mother    Clotting disorder Mother    Heart disease Father    Heart disease Brother        MI at age 2   Clotting disorder Maternal Grandmother    Stroke Neg Hx     Social History:   reports that he quit smoking about 32 years ago. His smoking use included cigarettes. He has never used smokeless tobacco. He reports that he does not drink alcohol and does not use drugs.  Physical Exam: BP 105/70   Pulse 85   Ht 5\' 7"  (1.702 m)   Wt 133 lb (60.3 kg)   BMI 20.83 kg/m   Constitutional:  Alert and oriented, no acute distress, nontoxic appearing HEENT: [place], AT Cardiovascular: No clubbing, cyanosis, or  edema Respiratory: Normal respiratory effort, no increased work of breathing Skin: No rashes, bruises or suspicious lesions Neurologic: Grossly intact, no focal deficits, moving all 4 extremities Psychiatric: Normal mood and affect  Laboratory Data: Results for orders placed or performed in visit on 06/18/23  BLADDER SCAN AMB NON-IMAGING   Collection Time: 06/18/23  3:33 PM  Result Value Ref Range   Scan Result    *Note: Due to a large number of results and/or encounters for the requested time period, some results have not been displayed. A complete set of results can be  found in Results Review.   Assessment & Plan:   1. Lower urinary tract symptoms (LUTS) (Primary) Incomplete bladder emptying today with nonobstructive prostate seen on cystoscopy.  I am hesitant to start him on a beta 3 agonist due to concerns for worsening incomplete emptying and UTIs.  I offered him urodynamics for further evaluation of his voiding symptoms and he agreed. - BLADDER SCAN AMB NON-IMAGING - Ambulatory referral to Urology  Return in about 3 months (around 09/17/2023) for UDS results with Dr. Cherylene Corrente.  Kathreen Pare, PA-C  [facility] [place] [address] [place], [place] [address] [phone]

## 2023-06-19 ENCOUNTER — Ambulatory Visit: Admitting: Physician Assistant

## 2023-06-24 ENCOUNTER — Telehealth: Payer: Self-pay

## 2023-06-24 NOTE — Telephone Encounter (Signed)
 Verbal orders okayed for PT with Trevor Fudge. Mjp,lpn  Copied from CRM [id number]. Topic: Clinical - Home Health Verbal Orders >> Jun 24, 2023  1:34 PM Rosaria Common wrote: Caller/Agency: [facility]akes Number: [phone] Service Requested: Physical Therapy Frequency: 1x6 Any new concerns about the patient? No

## 2023-07-01 ENCOUNTER — Other Ambulatory Visit: Payer: Self-pay | Admitting: Urology

## 2023-07-03 ENCOUNTER — Other Ambulatory Visit: Payer: Self-pay | Admitting: Hospice and Palliative Medicine

## 2023-07-03 DIAGNOSIS — C3412 Malignant neoplasm of upper lobe, left bronchus or lung: Secondary | ICD-10-CM

## 2023-07-03 DIAGNOSIS — G893 Neoplasm related pain (acute) (chronic): Secondary | ICD-10-CM

## 2023-07-03 MED ORDER — FENTANYL 75 MCG/HR TD PT72: 1.0000 | MEDICATED_PATCH | TRANSDERMAL | 0 refills | Status: DC

## 2023-07-06 ENCOUNTER — Ambulatory Visit: Admitting: Student in an Organized Health Care Education/Training Program

## 2023-07-06 ENCOUNTER — Other Ambulatory Visit: Payer: Self-pay | Admitting: Hospice and Palliative Medicine

## 2023-07-06 DIAGNOSIS — C3412 Malignant neoplasm of upper lobe, left bronchus or lung: Secondary | ICD-10-CM

## 2023-07-06 DIAGNOSIS — G893 Neoplasm related pain (acute) (chronic): Secondary | ICD-10-CM

## 2023-07-06 MED ORDER — OXYCODONE-ACETAMINOPHEN 7.5-325 MG PO TABS: 1.0000 | ORAL_TABLET | ORAL | 0 refills | Status: DC | PRN

## 2023-07-07 ENCOUNTER — Inpatient Hospital Stay

## 2023-07-07 ENCOUNTER — Encounter: Payer: Self-pay | Admitting: Oncology

## 2023-07-07 ENCOUNTER — Inpatient Hospital Stay (HOSPITAL_BASED_OUTPATIENT_CLINIC_OR_DEPARTMENT_OTHER): Admitting: Oncology

## 2023-07-07 ENCOUNTER — Inpatient Hospital Stay: Attending: Radiation Oncology

## 2023-07-07 VITALS — BP 112/74 | HR 74 | Temp 96.1°F | Resp 16 | Wt 140.0 lb

## 2023-07-07 DIAGNOSIS — Z87442 Personal history of urinary calculi: Secondary | ICD-10-CM | POA: Insufficient documentation

## 2023-07-07 DIAGNOSIS — C7931 Secondary malignant neoplasm of brain: Secondary | ICD-10-CM | POA: Insufficient documentation

## 2023-07-07 DIAGNOSIS — M7138 Other bursal cyst, other site: Secondary | ICD-10-CM | POA: Insufficient documentation

## 2023-07-07 DIAGNOSIS — C3412 Malignant neoplasm of upper lobe, left bronchus or lung: Secondary | ICD-10-CM | POA: Diagnosis not present

## 2023-07-07 DIAGNOSIS — I110 Hypertensive heart disease with heart failure: Secondary | ICD-10-CM | POA: Diagnosis not present

## 2023-07-07 DIAGNOSIS — C7951 Secondary malignant neoplasm of bone: Secondary | ICD-10-CM

## 2023-07-07 DIAGNOSIS — G8929 Other chronic pain: Secondary | ICD-10-CM | POA: Diagnosis not present

## 2023-07-07 DIAGNOSIS — Z923 Personal history of irradiation: Secondary | ICD-10-CM | POA: Insufficient documentation

## 2023-07-07 DIAGNOSIS — Z5112 Encounter for antineoplastic immunotherapy: Secondary | ICD-10-CM

## 2023-07-07 DIAGNOSIS — I2510 Atherosclerotic heart disease of native coronary artery without angina pectoris: Secondary | ICD-10-CM | POA: Insufficient documentation

## 2023-07-07 DIAGNOSIS — G629 Polyneuropathy, unspecified: Secondary | ICD-10-CM | POA: Diagnosis not present

## 2023-07-07 DIAGNOSIS — I700 Atherosclerosis of aorta: Secondary | ICD-10-CM | POA: Diagnosis not present

## 2023-07-07 DIAGNOSIS — Z87891 Personal history of nicotine dependence: Secondary | ICD-10-CM | POA: Diagnosis not present

## 2023-07-07 DIAGNOSIS — N529 Male erectile dysfunction, unspecified: Secondary | ICD-10-CM | POA: Insufficient documentation

## 2023-07-07 DIAGNOSIS — K219 Gastro-esophageal reflux disease without esophagitis: Secondary | ICD-10-CM | POA: Insufficient documentation

## 2023-07-07 DIAGNOSIS — R569 Unspecified convulsions: Secondary | ICD-10-CM | POA: Insufficient documentation

## 2023-07-07 DIAGNOSIS — Z79899 Other long term (current) drug therapy: Secondary | ICD-10-CM | POA: Diagnosis not present

## 2023-07-07 DIAGNOSIS — M549 Dorsalgia, unspecified: Secondary | ICD-10-CM

## 2023-07-07 DIAGNOSIS — E785 Hyperlipidemia, unspecified: Secondary | ICD-10-CM | POA: Diagnosis not present

## 2023-07-07 DIAGNOSIS — M25562 Pain in left knee: Secondary | ICD-10-CM | POA: Diagnosis not present

## 2023-07-07 DIAGNOSIS — N400 Enlarged prostate without lower urinary tract symptoms: Secondary | ICD-10-CM | POA: Diagnosis not present

## 2023-07-07 DIAGNOSIS — E1140 Type 2 diabetes mellitus with diabetic neuropathy, unspecified: Secondary | ICD-10-CM | POA: Diagnosis not present

## 2023-07-07 DIAGNOSIS — Z794 Long term (current) use of insulin: Secondary | ICD-10-CM | POA: Insufficient documentation

## 2023-07-07 DIAGNOSIS — M2550 Pain in unspecified joint: Secondary | ICD-10-CM | POA: Insufficient documentation

## 2023-07-07 DIAGNOSIS — Z791 Long term (current) use of non-steroidal anti-inflammatories (NSAID): Secondary | ICD-10-CM | POA: Insufficient documentation

## 2023-07-07 DIAGNOSIS — I509 Heart failure, unspecified: Secondary | ICD-10-CM | POA: Diagnosis not present

## 2023-07-07 DIAGNOSIS — Z8673 Personal history of transient ischemic attack (TIA), and cerebral infarction without residual deficits: Secondary | ICD-10-CM | POA: Insufficient documentation

## 2023-07-07 DIAGNOSIS — Z85820 Personal history of malignant melanoma of skin: Secondary | ICD-10-CM | POA: Diagnosis not present

## 2023-07-07 LAB — COMPREHENSIVE METABOLIC PANEL WITH GFR
ALT: 9 U/L (ref 0–44)
AST: 18 U/L (ref 15–41)
Albumin: 3.1 g/dL — ABNORMAL LOW (ref 3.5–5.0)
Alkaline Phosphatase: 68 U/L (ref 38–126)
Anion gap: 10 (ref 5–15)
BUN: 18 mg/dL (ref 8–23)
CO2: 24 mmol/L (ref 22–32)
Calcium: 8.3 mg/dL — ABNORMAL LOW (ref 8.9–10.3)
Chloride: 101 mmol/L (ref 98–111)
Creatinine, Ser: 0.73 mg/dL (ref 0.61–1.24)
GFR, Estimated: >60 mL/min (ref >60)
Glucose, Bld: 109 mg/dL — ABNORMAL HIGH (ref 70–99)
Potassium: 3.8 mmol/L (ref 3.5–5.1)
Sodium: 135 mmol/L (ref 135–145)
Total Bilirubin: 0.5 mg/dL (ref 0.0–1.2)
Total Protein: 6.3 g/dL — ABNORMAL LOW (ref 6.5–8.1)

## 2023-07-07 LAB — CBC WITH DIFFERENTIAL/PLATELET
Abs Immature Granulocytes: 0.04 10*3/uL (ref 0.00–0.07)
Basophils Absolute: 0.1 10*3/uL (ref 0.0–0.1)
Basophils Relative: 1 %
Eosinophils Absolute: 0.6 10*3/uL — ABNORMAL HIGH (ref 0.0–0.5)
Eosinophils Relative: 9 %
HCT: 37.9 % — ABNORMAL LOW (ref 39.0–52.0)
Hemoglobin: 12.3 g/dL — ABNORMAL LOW (ref 13.0–17.0)
Immature Granulocytes: 1 %
Lymphocytes Relative: 14 %
Lymphs Abs: 1.1 10*3/uL (ref 0.7–4.0)
MCH: 29.4 pg (ref 26.0–34.0)
MCHC: 32.5 g/dL (ref 30.0–36.0)
MCV: 90.7 fL (ref 80.0–100.0)
Monocytes Absolute: 0.7 10*3/uL (ref 0.1–1.0)
Monocytes Relative: 9 %
Neutro Abs: 5 10*3/uL (ref 1.7–7.7)
Neutrophils Relative %: 66 %
Platelets: 270 10*3/uL (ref 150–400)
RBC: 4.18 MIL/uL — ABNORMAL LOW (ref 4.22–5.81)
RDW: 14.1 % (ref 11.5–15.5)
WBC: 7.4 10*3/uL (ref 4.0–10.5)
nRBC: 0 % (ref 0.0–0.2)

## 2023-07-07 MED ORDER — SODIUM CHLORIDE 0.9 % IV SOLN
Freq: Once | INTRAVENOUS | Status: AC
Start: 2023-07-07 — End: 2023-07-07
  Filled 2023-07-07: qty 250

## 2023-07-07 MED ORDER — HEPARIN SOD (PORK) LOCK FLUSH 100 UNIT/ML IV SOLN
500.0000 [IU] | Freq: Once | INTRAVENOUS | Status: AC | PRN
  Administered 2023-07-07: 500 [IU]
  Filled 2023-07-07: qty 5

## 2023-07-07 MED ORDER — SODIUM CHLORIDE 0.9% FLUSH
10.0000 mL | INTRAVENOUS | Status: AC | PRN
Start: 2023-07-07 — End: ?
  Administered 2023-07-07: 10 mL
  Filled 2023-07-07: qty 10

## 2023-07-07 MED ORDER — SODIUM CHLORIDE 0.9 % IV SOLN
200.0000 mg | Freq: Once | INTRAVENOUS | Status: AC
  Administered 2023-07-07: 200 mg via INTRAVENOUS
  Filled 2023-07-07: qty 8

## 2023-07-07 MED ORDER — DENOSUMAB 120 MG/1.7ML ~~LOC~~ SOLN
120.0000 mg | Freq: Once | SUBCUTANEOUS | Status: AC
  Administered 2023-07-07: 120 mg via SUBCUTANEOUS
  Filled 2023-07-07: qty 1.7

## 2023-07-07 NOTE — Patient Instructions (Signed)

## 2023-07-07 NOTE — Assessment & Plan Note (Signed)
 On Keppra.  Recommend patient to follow up with neurologist.

## 2023-07-07 NOTE — Assessment & Plan Note (Addendum)
 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  S/p lung radiation to left lung.  January 2025, CT and PET scan results were reviewed. Stable disease. No progression or new disease.  Labs are reviewed and discussed with patient. I recommend to continue Keytruda   Radiation oncology has ordered his next CT scan scheduled in July 2025.

## 2023-07-07 NOTE — Assessment & Plan Note (Signed)
 Fingertip neuropathy intermittent Grade 1, feet neuropathy persistent, Grade 2.  On Gabapentin 300mg  TID

## 2023-07-07 NOTE — Assessment & Plan Note (Signed)
 Back pain, MRI lumbar showed DJD, L4-L5 synovial cyst,PET scan showed L4 fracture Continue PRN percocet 7.5mg /325mg , fentanyl patch Q72 hours

## 2023-07-07 NOTE — Progress Notes (Signed)
 Hematology/Oncology Progress note [phone] Fax:([phone]) [phone]       CHIEF COMPLAINTS/REASON FOR VISIT:  Metastatic non-small cell lung cancer  ASSESSMENT & PLAN:   Cancer Staging  Primary non-small cell carcinoma of upper lobe of left lung (HCC) Staging form: Lung, AJCC 8th Edition - Clinical: Stage IV (cT2b, cN1, cM1) - Signed by Micheal Forbes, MD on 08/06/2021   Primary non-small cell carcinoma of upper lobe of left lung (HCC) Stage IV lung adenocarcinoma with brain and bone metastasis.  S/p lung radiation to left lung.  January 2025, CT and PET scan results were reviewed. Stable disease. No progression or new disease.  Labs are reviewed and discussed with patient. I recommend to continue Keytruda   Radiation oncology has ordered his next CT scan scheduled in July 2025.  Metastasis to bone [facility]) Previously on Zometa  every 4- 6 weeks. Continue calcium  supplementation Did not tolerate Zometa .  Recommend Xgeva every 8 to 12 weeks.  Back pain Back pain, MRI lumbar showed DJD, L4-L5 synovial cyst,PET scan showed L4 fracture Continue PRN percocet 7.5mg /325mg , fentanyl  patch 75mcg Q72 hours     Encounter for antineoplastic immunotherapy Immunotherapy plan as listed above  Neuropathy Fingertip neuropathy intermittent Grade 1, feet neuropathy persistent, Grade 2.  On Gabapentin  300mg  TID     Seizure (HCC) On Keppra .  Recommend patient to follow up with neurologist.   Arthralgia Right shoulder and knee pain. Recommend patient to follow-up with orthopedic surgeon for further evaluation and management.   Follow up 3 weeks All questions were answered. The patient knows to call the clinic with any problems, questions or concerns.  Micheal Forbes, MD, [name] [facility] Hematology Oncology 07/07/2023      HISTORY OF PRESENTING ILLNESS:   Micheal Donovan is a  73 y.o.  male presents for follow up of Non-small cell lung cancer.  Oncology History Overview Note   Diagnosis: Stage IIB T2b N1 M0 adenocarcinoma of the LUL, poorly differentiated     Primary non-small cell carcinoma of upper lobe of left lung (HCC)  06/13/2021 Initial Diagnosis   Primary non-small cell carcinoma of upper lobe of left lung [facility]) -06/20/2021 - 06/27/2021, patient presented to [facility] due to progressive headache/dizziness/gait changes. CT head showed acute to subacute intraparenchymal hemorrhage involving the left cerebellum.  Surrounding low-density vasogenic edema.  Patient was transferred to [facility]. This was further evaluated by CT angiogram of the neck which showed bulky calcified plaques/stenosis of carotid artery, Followed by MRI brain. 06/12/2021, MRI of the brain showed no significant interval change in size of the left cerebellar intraparenchymal hematoma with unchanged regional mass effect and partial effacement of fourth ventricle but no upstream hydrocephalus. There is no discernible enhancement to suggest underlying mass lesion, though acute blood products could mask enhancement.   06/12/2021 a chest x-ray showed a 4.4 cm left middle lobe. 06/13/2021, CT chest with contrast showed a 4.3 x 3.6 x 3.3 cm lobular spiculated mass in the posterior left upper lobe with T3 to the lateral pleura and major fissure.  Metastatic left hilar lymphadenopathy.  Peripheral micronodularity posterior right costophrenic sulcus.  Aortic atherosclerosis. 06/14/2021, CT abdomen pelvis showed right lower lobe pulmonary artery embolus.  No evidence of right heart strain.  No acute intra-abdominal or pelvic pathology.  Aortic atherosclerosis.  06/13/2021, patient underwent bronchoscopy with EBUS by Dr. Felipe Horton.  Biopsy from the fine-needle aspiration of station 11 mL showed malignant cells, consistent with poorly differentiated non-small cell carcinoma, consistent with adenocarcinoma.  Malignant  cells are TTF-1 positive and negative for p40.  Negative for neuroendocrine markers.  Blood  test and tissue sample were tested for Gardant 360  -PD-L1 TPS 97%, no actionable mutation on the blood testing. 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   07/17/2021 Cancer Staging   Staging form: Lung, AJCC 8th Edition - Clinical: Stage IV (cT2b, cN1, cM1) - Signed by Micheal Forbes, MD on 08/06/2021   08/06/2021 Imaging   I saw patient's PET scan after his visit with me on 08/06/21. PET scan was ordered by his previous oncologist group and did not come to my in basket.. Patient received cycle 1 carboplatin  Taxol  today.  He has started on radiation.  5/26/3 PET scan showed 3.8 cm hypermetabolic left upper lobe mass with hypermetabolic left hilar and infrahilar adenopathy.  There is approximately 8 scattered metastatic lesions in the skeleton. Mixed density photopenic lesion in the left cerebellum. characterized as hemorrhage on recent prior imaging workups.    08/16/2021 -  Chemotherapy   Patient is on Treatment Plan : LUNG Carboplatin  (5) + Pemetrexed  + Pembrolizumab  (200) D1 q21d Induction x 4 cycles / Maintenance Pemetrexed  + Pembrolizumab  (200) D1 q21d      08/22/2021 Imaging   MRI brain w wo contrast  Decrease in size of left cerebellar hematoma with resolution of edema. No evidence of underlying lesion. Smaller, more recent hemorrhage in the left cerebellar vermis with minimal edema. There is minimal enhancement without definite evidence of underlying lesion.  New punctate focus of chronic blood products and enhancement in the left frontal lobe. Additional new foci of chronic blood products in the posterior right putamen, right parietal subcortical white matter, and posterior right cerebellum. Unclear at this time if these represent foci of bland hemorrhage or early metastases.   Increase in size of right parietal osseous metastasis with minor extraosseous extension.     Imaging   PET scan showed 1. Interval response to therapy as evidenced by a small residual left upper lobe  nodule with decreased hypermetabolism, no residual hypermetabolic adenopathy and decreased hypermetabolism associatedwith osseous metastases. 2. 6 mm posterior left upper lobe nodule, likely stable. Recommend attention on follow-up. 3. Aortic atherosclerosis (ICD10-I70.0). Coronary artery calcification.   12/17/2021 Imaging   MRI lumbar spine w wo contrast  1. No evidence of regional metastatic disease. 2. Late subacute superior endplate fracture at L2 and large superior endplate Schmorl's node at L5, unchanged since the PET scan of 10/29/2021. 3. L3-4: Shallow disc protrusion. Facet and ligamentous hypertrophy. Stenosis of both lateral recesses. Findings slightly worsened since 2022. 4. L4-5: Shallow disc protrusion. Facet and ligamentous hypertrophy. Small synovial cyst arising from the facet joint on the right. Stenosis of the lateral recesses right worse than left. Foraminal narrowing right worse than left. Findings have worsened since 2022.  5. L5-S1: Endplate osteophytes and shallow protrusion of the disc. Facet and ligamentous hypertrophy. Stenosis of the subarticular lateral recesses and neural foramina, right worse than left. Similar appearance to the study of 2022. 6. Continued evidence of enteritis of at least 1 loop of small bowel. Distended bladder present   12/18/2021 Imaging   Brian MRI w wo  1. Interval decrease in size of the left cerebellar and vermian hematomas. No definite evidence of underlying metastatic lesion. 2. There is are two possible contrast enhancing lesions in the posterior left frontal lobe and left occipital lobe, which do not have intrinsic T1 signal abnormality, but have a somewhat linear appearance and may be vascular in  nature. Recommend attention on follow up. 3. Multifocal sites of susceptibility artifact are redemonstrated with interval development of a few new foci, as described above. All of these sites demonstrate intrinsic T1 hyperintense signal  abnormality and susceptibility artifact and are compatible with sites of microhemorrhages. Recommend continued attention on follow up. 4. Interval decrease in size of right parietal calvarial metastatic lesion. No new contrast enhancing lesions visualized. 5. Diffusely heterogeneous marrow signal throughout the cervical spine, which is nonspecific but can be seen in the setting of anemia, smoking, obesity, or a marrow replacement process.     12/18/2021 Imaging   MRI lumbar spine w wo contrast  1. No evidence of regional metastatic disease. 2. Late subacute superior endplate fracture at L2 and large superior endplate Schmorl's node at L5, unchanged since the PET scan of 10/29/2021 3. L3-4: Shallow disc protrusion. Facet and ligamentous hypertrophy.Stenosis of both lateral recesses. Findings slightly worsened since 2022. 4. L4-5: Shallow disc protrusion. Facet and ligamentous hypertrophy. Small synovial cyst arising from the facet joint on the right. Stenosis of the lateral recesses right worse than left. Foraminal narrowing right worse than left. Findings have worsened since 2022. 5. L5-S1: Endplate osteophytes and shallow protrusion of the disc. Facet and ligamentous hypertrophy. Stenosis of the subarticular lateral recesses and neural foramina, right worse than left. Similar appearance to the study of 2022. 6. Continued evidence of enteritis of at least 1 loop of small bowel. Distended bladder present.       01/28/2022 Imaging   CT chest abdomen pelvis w contrast 1. Treated left upper lobe mass appears grossly stable to the prior examination when measured in a similar fashion on the prior study. No definite signs of extra skeletal metastatic disease noted in the chest, abdomen or pelvis. 2. Widespread skeletal metastases redemonstrated, as above. 3. Hepatic steatosis. 4. Aortic atherosclerosis, in addition to left main and three-vessel coronary artery disease. Assessment for potential  risk factor modification, dietary therapy or pharmacologic therapy may be warranted, if clinically indicated. 5. Additional incidental findings,    04/08/2022 Imaging   MRI lumbar spine w wo contrast  1. Stable remote superior endplate fracture of L2 and large Schmorl to noted involving L5. No worrisome bone lesions to suggest metastatic disease. 2. Degenerative lumbar spondylosis with multilevel disc disease and facet disease which appears relatively stable as detailed above. 3. Enlarging right-sided synovial cyst at L4-5 with progressive mass effect on the right side of the thecal sac. This could potentially be symptomatic   04/28/2022 Imaging   CT chest abdomen pelvis wo contrast  1. Similar versus slightly decreased size of treated left upper lobe primary bronchogenic carcinoma. 2. Similar osseous metastasis. 3. No evidence of extraosseous metastatic disease. 4. New small right pleural effusion. 5. Coronary artery atherosclerosis. Aortic Atherosclerosis   07/03/2022 Imaging   PET scan showed 1. Interval development of a small focus of intense hypermetabolism within the left upper lobe scar. This is associated with a 5 mm perifissural nodule in the left lung which is hypermetabolic. Hypermetabolism at both of these locations is new since prior PET-CT and highly suspicious for recurrent disease. 2. No evidence for hypermetabolic soft tissue metastatic disease in the neck, abdomen, or pelvis. 3. Similar appearance of sclerotic and lytic bone lesions without hypermetabolism on PET imaging. 4. Focal hypermetabolism associated with a fracture of the right L4 transverse process. The fracture was visible on the previous CT of 04/28/2022. No associated soft tissue lesion to suggest that this is pathologic in  nature. Tracer accumulation on today's study is in keeping with healing fracture. There is also some minimal uptake in a prominent spur associated with the L4 superior endplate, most likely  degenerative. 5.  Aortic Atherosclerosis    08/01/2022 - 08/14/2022 Radiation Therapy   Radiation to left lung.    10/01/2022 Imaging   chest angiogram PE protocol  No pulmonary embolism identified.   Stable nodular opacity in the left upper lobe. Please correlate for history of treated lung cancer. There is an adjacent small nodule along the course of the interlobar fissure which is slightly larger today compared to the study of February 2024. please correlate with findings of the recent PET-CT of 07/03/2022.      10/02/2022 Imaging   MRI brain with and without contrast 1. No acute intracranial abnormality. No evidence for intraparenchymal metastatic disease. 2. Chronic left cerebellar hemorrhage with multiple additional chronic micro hemorrhages elsewhere throughout the brain, stable. 3. Underlying mild chronic microvascular ischemic disease.    10/02/2022 Imaging   MRI lumbar spine showed 1. No MRI evidence for acute infection or other abnormality within the lumbar spine. 2. Few subcentimeter T1 hypointense lesions about the visualized posterior iliac wings. These correspond with small sclerotic foci seen on most recent CT from 04/08/2022, presumably reflecting patient's known osseous metastatic disease. These were not hypermetabolic on prior PET-CT from 16/12/9602. 3. No other evidence for metastatic disease within the lumbar spine itself. 4. Chronic L2 compression fracture, stable. Large Schmorl's node deformity with associated height loss at L5, also stable. 5. Multifactorial degenerative changes at L3-4 through L5-S1 with resultant mild to moderate bilateral subarticular stenosis, with mild to moderate bilateral L3 through L5 foraminal narrowing as above.   12/24/2022 Imaging   CT chest w contrast   1. Stable appearance of treated lung lesion within the left upper lobe with surrounding scarring and architectural distortion. 2. The previous FDG avid subpleural nodule  abutting the oblique fissure of the left lung (adjacent to post treatment changes) is again noted measuring 7 mm. This is stable from 10/01/2022. On the PET-CT from 07/03/2022 this nodule was measured at 5 mm. 3. New bandlike area of subpleural consolidation within the lateral and posterior left lower lobe is identified. This is favored to represent post treatment change. 4. Stable sclerotic lesions involving the T12 and L1 vertebra. 5. Coronary artery calcifications. 6.  Aortic Atherosclerosis    03/06/2023 Imaging   CT chest abdomen pelvis wo contrast showed  Increasing opacity along the lingula with the increasing nodular central component compared to the recent CT scan.   There is also some changing opacities in the left lower lobe. The larger more peripheral area in the left lower lobe is decreasing with some new areas of subtle ground-glass more caudal. Attention on follow-up.   No developing new other mass lesion or nodal enlargement.   Scattered sclerotic bone lesions has a similar distribution to previous.   Overall evaluation for solid organ pathology including solid organ metastases are limited without the advantage of IV contrast.     03/23/2023 Imaging   PET scan showed 1. Post radiation consolidation in the lingula with mild residual focal hypermetabolism, overall improved from 07/03/2022. No evidence of disease progression. 2. Quiescent osseous metastatic disease. 3. Aortic atherosclerosis (ICD10-I70.0). Coronary artery calcification.   # Patient has a history of melanoma on his neck, treated in 2009. # History of hemorrhagic infarct left cerebellum  10/01/2022 - 10/06/2022 patient was admitted to the hospital due to lower  extremity weakness and intermittent aphasia. Patient reports acute on chronic left lower extremity weakness, weakness has been more prominent over the past 2 weeks.  He reports significant episode of difficulty with ambulation 2 days after his last  Keytruda  treatments.  He reports that he felt loss of control of muscle at that time.  Patient reports that the weakness is different than her chronic lower extremity  weakness.  Patient was found to have orthostatic hypotension Flomax  irbesartan  were held.  CK wnl, Neurology was consulted and started the patient on Keppra  for possible seizure.  EEG was negative.  01/31/23 He recently presented to ER due to altered mental status and shaking episodes.  CT head wo negative. UA positive for nitrate and leukocyte esterase. Code sepsis was actived in ER and he was recommended for treatment of UTI.  He declined admission and went home with Keflex  500mg  4 times daily for 10 days.  Lower extremity weakness localizes best to lumbosacral nerve roots, known spondylosis with multiple foci of neuro-foraminal stenosis.  02/09/2023 S/p decompression neurosurgery   04/17/2023 - 04/21/2023 hospitalized due to seizure, and UTI he was loaded with Keppra . Seen by neurology in office. UTI was treated with IV rocephin , discharged on Cipro .  INTERVAL HISTORY ARTA BAZ is a 73 y.o. male who has above history reviewed by me today presents for follow up visit for management of  Stage IV lung adenocarcinoma Accompanied by care give.   + back pain, and shoulder and knee pain, on fentanyl  patch 75mcg Q72 hours and percocet  Today he reports feeling at baseline.  Right shoulder and left knee pain.    Review of Systems  Constitutional:  Positive for fatigue. Negative for appetite change, chills, fever and unexpected weight change.  HENT:   Negative for hearing loss and voice change.   Eyes:  Negative for eye problems and icterus.  Respiratory:  Positive for shortness of breath. Negative for chest tightness and cough.   Cardiovascular:  Positive for leg swelling. Negative for chest pain.  Gastrointestinal:  Negative for abdominal distention, abdominal pain and blood in stool.  Endocrine: Negative for hot flashes.   Genitourinary:  Negative for difficulty urinating, dysuria and frequency.   Musculoskeletal:  Positive for arthralgias and back pain.       Chronic lower extremity weakness  Skin:  Negative for itching and rash.  Neurological:  Negative for extremity weakness, headaches, light-headedness and numbness.  Hematological:  Negative for adenopathy. Does not bruise/bleed easily.  Psychiatric/Behavioral:  Negative for confusion.     MEDICAL HISTORY:  Past Medical History:  Diagnosis Date   Allergy    Anxiety    BPH (benign prostatic hypertrophy)    Cancer (HCC)    Melanoma on Neck    2008   Carotid artery occlusion    CHF (congestive heart failure) (HCC)    Diabetes mellitus    type 2   ED (erectile dysfunction)    GERD (gastroesophageal reflux disease)    Hemorrhagic stroke (HCC) 06/2021   Hyperlipidemia    Hypertension    Lung cancer (HCC)    Neoplasm related pain    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)    Stroke [facility])     SURGICAL HISTORY: Past Surgical History:  Procedure Laterality Date   ANTERIOR CERVICAL DECOMP/DISCECTOMY FUSION N/A 02/09/2023   Procedure: C3-5 ANTERIOR CERVICAL DISCECTOMY AND FUSION;  Surgeon: Jodeen Munch, MD;  Location: [facility] ORS;  Service: Neurosurgery;  Laterality: N/A;   BRONCHIAL NEEDLE ASPIRATION  BIOPSY  06/17/2021   Procedure: BRONCHIAL NEEDLE ASPIRATION BIOPSIES;  Surgeon: Josiah Nigh, MD;  Location: [facility] ENDOSCOPY;  Service: Pulmonary;;   IR IMAGING GUIDED PORT INSERTION  08/05/2021   MELANOMA EXCISION  2008   Left side of neck   RADIOLOGY WITH ANESTHESIA N/A 04/05/2020   Procedure: MRI SPINE WITOUT CONTRAST;  Surgeon: Radiologist, Medication, MD;  Location: [facility] OR;  Service: Radiology;  Laterality: N/A;   RADIOLOGY WITH ANESTHESIA N/A 08/22/2021   Procedure: MRI BRAIN WITH AND WITHOUT CONTRAST  WITH ANESTHESIA;  Surgeon: Radiologist, Medication, MD;  Location: [facility] OR;  Service: Radiology;  Laterality: N/A;   RADIOLOGY WITH ANESTHESIA N/A  12/17/2021   Procedure: MRI BRAIN WITH AND WITHOUT CONTRAST WITH ANESTHESIA; MRI LUMBER WITH AND WITHOUT CONTRAST;  Surgeon: Radiologist, Medication, MD;  Location: [facility] OR;  Service: Radiology;  Laterality: N/A;   RADIOLOGY WITH ANESTHESIA N/A 04/08/2022   Procedure: MRI LUMBER SPINE WITH AND WITHOUT CONTRAST;  Surgeon: Radiologist, Medication, MD;  Location: [facility] OR;  Service: Radiology;  Laterality: N/A;   TONSILLECTOMY     VIDEO BRONCHOSCOPY WITH ENDOBRONCHIAL ULTRASOUND N/A 06/17/2021   Procedure: VIDEO BRONCHOSCOPY WITH ENDOBRONCHIAL ULTRASOUND;  Surgeon: Josiah Nigh, MD;  Location[facility] ENDOSCOPY;  Service: Pulmonary;  Laterality: N/A;    SOCIAL HISTORY: Social History   Socioeconomic History   Marital status: Married    Spouse name: Not on file   Number of children: Not on file   Years of education: Not on file   Highest education level: Associate degree: occupational, Scientist, product/process development, or vocational program  Occupational History   Not on file  Tobacco Use   Smoking status: Former    Current packs/day: 0.00    Types: Cigarettes    Quit date: 07/21/1990    Years since quitting: 32.9   Smokeless tobacco: Never  Vaping Use   Vaping status: Never Used  Substance and Sexual Activity   Alcohol use: No   Drug use: No   Sexual activity: Not Currently  Other Topics Concern   Not on file  Social History Narrative   Not on file   Social Drivers of Health   Financial Resource Strain: Low Risk  (05/27/2023)   Overall Financial Resource Strain (CARDIA)    Difficulty of Paying Living Expenses: Not very hard  Food Insecurity: No Food Insecurity (05/27/2023)   Hunger Vital Sign    Worried About Running Out of Food in the Last Year: Never true    Ran Out of Food in the Last Year: Never true  Transportation Needs: No Transportation Needs (05/27/2023)   PRAPARE - Administrator, Civil Service (Medical): No    Lack of Transportation (Non-Medical): No  Physical Activity: Unknown  (05/27/2023)   Exercise Vital Sign    Days of Exercise per Week: 0 days    Minutes of Exercise per Session: Not on file  Stress: Stress Concern Present (05/27/2023)   [employer] of Occupational Health - Occupational Stress Questionnaire    Feeling of Stress : To some extent  Social Connections: Unknown (05/27/2023)   Social Connection and Isolation Panel [NHANES]    Frequency of Communication with Friends and Family: Once a week    Frequency of Social Gatherings with Friends and Family: Patient declined    Attends Religious Services: Patient unable to answer    Active Member of Clubs or Organizations: No    Attends Banker Meetings: Patient unable to answer    Marital Status: Married  Intimate Partner Violence: Not At Risk (04/22/2023)   Humiliation, Afraid, Rape, and Kick questionnaire    Fear of Current or Ex-Partner: No    Emotionally Abused: No    Physically Abused: No    Sexually Abused: No    FAMILY HISTORY: Family History  Problem Relation Age of Onset   COPD Mother    Clotting disorder Mother    Heart disease Father    Heart disease Brother        MI at age 11   Clotting disorder Maternal Grandmother    Stroke Neg Hx     ALLERGIES:  is allergic to niaspan  [niacin ].  MEDICATIONS:  Current Outpatient Medications  Medication Sig Dispense Refill   ALPRAZolam  (XANAX ) 0.5 MG tablet Take 1 tablet (0.5 mg total) by mouth at bedtime as needed for anxiety. 15 tablet 0   celecoxib  (CELEBREX ) 200 MG capsule Take 1 capsule (200 mg total) by mouth 2 (two) times daily. 60 capsule 0   citalopram  (CELEXA ) 10 MG tablet Take 1 tablet (10 mg total) by mouth daily. 30 tablet 3   docusate sodium  (COLACE) 100 MG capsule Take 100 mg by mouth 2 (two) times daily as needed for mild constipation.     fentaNYL  (DURAGESIC ) 75 MCG/HR Place 1 patch onto the skin every 3 (three) days. 10 patch 0   gabapentin  (NEURONTIN ) 300 MG capsule Take 2 capsules (600 mg total) by mouth 2  (two) times daily. 120 capsule 2   insulin  glargine (LANTUS ) 100 UNIT/ML injection Inject 0-30 Units into the skin daily as needed (High Blood Sugar over 150).     Insulin  Pen Needle (B-D UF III MINI PEN NEEDLES) 31G X 5 MM MISC Use to inject insulin  daily. 100 each 3   levETIRAcetam  (KEPPRA ) 500 MG tablet Take 1 tablet (500 mg total) by mouth 2 (two) times daily. 180 tablet 3   megestrol  (MEGACE ) 40 MG tablet Take 1 tablet (40 mg total) by mouth daily. 30 tablet 3   methocarbamol  (ROBAXIN ) 500 MG tablet Take 1 tablet (500 mg total) by mouth every 6 (six) hours as needed for muscle spasms. 120 tablet 0   NOVOLOG  FLEXPEN 100 UNIT/ML FlexPen Inject 0-10 Units into the skin daily as needed for high blood sugar (BG > 200).     Nystatin  (GERHARDT'S BUTT CREAM) CREA Apply 14 Applications topically 2 (two) times daily. 1 each 0   omeprazole  (PRILOSEC) 20 MG capsule Take 20 mg by mouth daily.     oxyCODONE -acetaminophen  (PERCOCET) 7.5-325 MG tablet Take 1 tablet by mouth every 4 (four) hours as needed for severe pain (pain score 7-10). Do not take with cough medication or other pain medication 60 tablet 0   senna (SENOKOT) 8.6 MG TABS tablet Take 1 tablet (8.6 mg total) by mouth 2 (two) times daily as needed for mild constipation. 30 tablet 0   tamsulosin  (FLOMAX ) 0.4 MG CAPS capsule TAKE 1 CAPSULE(0.4 MG) BY MOUTH DAILY 30 capsule 2   No current facility-administered medications for this visit.   Facility-Administered Medications Ordered in Other Visits  Medication Dose Route Frequency Provider Last Rate Last Admin   heparin  lock flush 100 UNIT/ML injection            sodium chloride  flush (NS) 0.9 % injection 10 mL  10 mL Intracatheter PRN Micheal Forbes, MD   10 mL at 07/07/23 1434     PHYSICAL EXAMINATION:  Vitals:   07/07/23 1253  BP: 112/74  Pulse: 74  Resp:  16  Temp: (!) 96.1 F (35.6 C)  SpO2: 100%   Filed Weights   07/07/23 1253  Weight: 140 lb (63.5 kg)    Physical  Exam Constitutional:      General: He is not in acute distress.    Appearance: He is ill-appearing.  HENT:     Head: Normocephalic and atraumatic.  Eyes:     General: No scleral icterus. Cardiovascular:     Rate and Rhythm: Normal rate and regular rhythm.     Heart sounds: Normal heart sounds.  Pulmonary:     Effort: Pulmonary effort is normal. No respiratory distress.     Breath sounds: No wheezing.     Comments: Decreased breath sound bilaterally. Abdominal:     General: Bowel sounds are normal. There is no distension.     Palpations: Abdomen is soft.  Musculoskeletal:        General: No deformity. Normal range of motion.     Cervical back: Normal range of motion and neck supple.     Comments: Bilateral ankle trace edema.  Skin:    General: Skin is warm and dry.     Findings: No rash.  Neurological:     Mental Status: He is alert. Mental status is at baseline.     Cranial Nerves: No cranial nerve deficit.     Comments: Chronic left-sided weakness  Psychiatric:        Mood and Affect: Mood normal.     LABORATORY DATA:  I have reviewed the data as listed    Latest Ref Rng & Units 07/07/2023   12:37 PM 06/16/2023    1:28 PM 05/26/2023   12:55 PM  CBC  WBC 4.0 - 10.5 K/uL 7.4  8.6  6.5   Hemoglobin 13.0 - 17.0 g/dL 13.2  44.0  10.2   Hematocrit 39.0 - 52.0 % 37.9  39.7  38.4   Platelets 150 - 400 K/uL 270  264  302       Latest Ref Rng & Units 07/07/2023   12:37 PM 06/16/2023    1:28 PM 05/26/2023   12:55 PM  CMP  Glucose 70 - 99 mg/dL 725  366  440   BUN 8 - 23 mg/dL 18  16  16    Creatinine 0.61 - 1.24 mg/dL 3.47  4.25  9.56   Sodium 135 - 145 mmol/L 135  131  136   Potassium 3.5 - 5.1 mmol/L 3.8  3.9  3.9   Chloride 98 - 111 mmol/L 101  98  101   CO2 22 - 32 mmol/L 24  25  24    Calcium  8.9 - 10.3 mg/dL 8.3  8.5  8.8   Total Protein 6.5 - 8.1 g/dL 6.3  6.6  6.9   Total Bilirubin 0.0 - 1.2 mg/dL 0.5  0.7  0.8   Alkaline Phos 38 - 126 U/L 68  64  61   AST 15 - 41  U/L 18  17  20    ALT 0 - 44 U/L 9  9  11       Iron /TIBC/Ferritin/ %Sat    Component Value Date/Time   IRON  60 02/03/2022 0958   TIBC 391 02/03/2022 0958   FERRITIN 225 04/19/2023 0648   IRONPCTSAT 15 (L) 02/03/2022 0958      RADIOGRAPHIC STUDIES: I have personally reviewed the radiological images as listed and agreed with the findings in the report. No results found.

## 2023-07-07 NOTE — Assessment & Plan Note (Addendum)
 Previously on Zometa  every 4- 6 weeks. Continue calcium  supplementation Did not tolerate Zometa .  Recommend Xgeva every 8 to 12 weeks.

## 2023-07-07 NOTE — Assessment & Plan Note (Signed)
 Right shoulder and knee pain. Recommend patient to follow-up with orthopedic surgeon for further evaluation and management.

## 2023-07-07 NOTE — Assessment & Plan Note (Signed)
 Immunotherapy plan as listed above

## 2023-07-08 ENCOUNTER — Encounter: Payer: Self-pay | Admitting: Student in an Organized Health Care Education/Training Program

## 2023-07-08 ENCOUNTER — Ambulatory Visit
Admission: RE | Admit: 2023-07-08 | Discharge: 2023-07-08 | Disposition: A | Discharge status: Home / Self Care | Source: Ambulatory Visit | Attending: Student in an Organized Health Care Education/Training Program | Admitting: Student in an Organized Health Care Education/Training Program

## 2023-07-08 ENCOUNTER — Ambulatory Visit
Attending: Student in an Organized Health Care Education/Training Program | Admitting: Student in an Organized Health Care Education/Training Program

## 2023-07-08 DIAGNOSIS — M47816 Spondylosis without myelopathy or radiculopathy, lumbar region: Secondary | ICD-10-CM | POA: Insufficient documentation

## 2023-07-08 MED ORDER — LIDOCAINE HCL 2 % IJ SOLN
20.0000 mL | Freq: Once | INTRAMUSCULAR | Status: AC
  Administered 2023-07-08: 400 mg

## 2023-07-08 MED ORDER — ROPIVACAINE HCL 2 MG/ML IJ SOLN
INTRAMUSCULAR | Status: AC
  Filled 2023-07-08: qty 20

## 2023-07-08 MED ORDER — DIAZEPAM 5 MG PO TABS
ORAL_TABLET | ORAL | Status: AC
  Filled 2023-07-08: qty 1

## 2023-07-08 MED ORDER — ROPIVACAINE HCL 2 MG/ML IJ SOLN
9.0000 mL | Freq: Once | INTRAMUSCULAR | Status: AC
  Administered 2023-07-08: 9 mL via PERINEURAL

## 2023-07-08 MED ORDER — LIDOCAINE HCL 2 % IJ SOLN
INTRAMUSCULAR | Status: AC
  Filled 2023-07-08: qty 20

## 2023-07-08 MED ORDER — DIAZEPAM 5 MG PO TABS
5.0000 mg | ORAL_TABLET | ORAL | Status: AC
  Administered 2023-07-08: 5 mg via ORAL

## 2023-07-08 NOTE — Patient Instructions (Addendum)
 [employer] representative gave teaching to patient and aide. ______________________________________________________________________    Post-Procedure Discharge Instructions  Instructions: Apply ice:  Purpose: This will minimize any swelling and discomfort after procedure.  When: Day of procedure, as soon as you get home. How: Fill a plastic sandwich bag with crushed ice. Cover it with a small towel and apply to injection site. How long: (15 min on, 15 min off) Apply for 15 minutes then remove x 15 minutes.  Repeat sequence on day of procedure, until you go to bed. Apply heat:  Purpose: To treat any soreness and discomfort from the procedure. When: Starting the next day after the procedure. How: Apply heat to procedure site starting the day following the procedure. How long: May continue to repeat daily, until discomfort goes away. Food intake: Start with clear liquids (like water) and advance to regular food, as tolerated.  Physical activities: Keep activities to a minimum for the first 8 hours after the procedure. After that, then as tolerated. Driving: If you have received any sedation, be responsible and do not drive. You are not allowed to drive for 24 hours after having sedation. Blood thinner: (Applies only to those taking blood thinners) You may restart your blood thinner 6 hours after your procedure. Insulin : (Applies only to Diabetic patients taking insulin ) As soon as you can eat, you may resume your normal dosing schedule. Infection prevention: Keep procedure site clean and dry. Shower daily and clean area with soap and water. Post-procedure Pain Diary: Extremely important that this be done correctly and accurately. Recorded information will be used to determine the next step in treatment. For the purpose of accuracy, follow these rules: Evaluate only the area treated. Do not report or include pain from an untreated area. For the purpose of this evaluation, ignore all other areas of  pain, except for the treated area. After your procedure, avoid taking a long nap and attempting to complete the pain diary after you wake up. Instead, set your alarm clock to go off every hour, on the hour, for the initial 8 hours after the procedure. Document the duration of the numbing medicine, and the relief you are getting from it. Do not go to sleep and attempt to complete it later. It will not be accurate. If you received sedation, it is likely that you were given a medication that may cause amnesia. Because of this, completing the diary at a later time may cause the information to be inaccurate. This information is needed to plan your care. Follow-up appointment: Keep your post-procedure follow-up evaluation appointment after the procedure (usually 2 weeks for most procedures, 6 weeks for radiofrequencies). DO NOT FORGET to bring you pain diary with you.   Expect: (What should I expect to see with my procedure?) From numbing medicine (AKA: Local Anesthetics): Numbness or decrease in pain. You may also experience some weakness, which if present, could last for the duration of the local anesthetic. Onset: Full effect within 15 minutes of injected. Duration: It will depend on the type of local anesthetic used. On the average, 1 to 8 hours.  From steroids (Applies only if steroids were used): Decrease in swelling or inflammation. Once inflammation is improved, relief of the pain will follow. Onset of benefits: Depends on the amount of swelling present. The more swelling, the longer it will take for the benefits to be seen. In some cases, up to 10 days. Duration: Steroids will stay in the system x 2 weeks. Duration of benefits will depend on  multiple posibilities including persistent irritating factors. Side-effects: If present, they may typically last 2 weeks (the duration of the steroids). Frequent: Cramps (if they occur, drink Gatorade and take over-the-counter Magnesium  450-500 mg once to twice a  day); water retention with temporary weight gain; increases in blood sugar; decreased immune system response; increased appetite. Occasional: Facial flushing (red, warm cheeks); mood swings; menstrual changes. Uncommon: Long-term decrease or suppression of natural hormones; bone thinning. (These are more common with higher doses or more frequent use. This is why we prefer that our patients avoid having any injection therapies in other practices.)  Very Rare: Severe mood changes; psychosis; aseptic necrosis. From procedure: Some discomfort is to be expected once the numbing medicine wears off. This should be minimal if ice and heat are applied as instructed.  Call if: (When should I call?) You experience numbness and weakness that gets worse with time, as opposed to wearing off. New onset bowel or bladder incontinence. (Applies only to procedures done in the spine)  Emergency Numbers: Durning business hours (Monday - Thursday, 8:00 AM - 4:00 PM) (Friday, 9:00 AM - 12:00 Noon): [phone] After hours: [phone] NOTE: If you are having a problem and are unable connect with, or to talk to a provider, then go to your nearest urgent care or emergency department. If the problem is serious and urgent, please call 911. ______________________________________________________________________

## 2023-07-08 NOTE — Progress Notes (Signed)
 PROVIDER NOTE: Interpretation of information contained herein should be left to medically-trained personnel. Specific patient instructions are provided elsewhere under "Patient Instructions" section of medical record. This document was created in part using STT-dictation technology, any transcriptional errors that may result from this process are unintentional.  Patient: Micheal Donovan Type: Established DOB: November 07, 1950 MRN: [id number] PCP: Austine Lefort, MD  Service: Procedure DOS: 07/08/2023 Setting: Ambulatory Location: Ambulatory outpatient facility Delivery: Face-to-face Provider: Cephus Collin, MD Specialty: Interventional Pain Management Specialty designation: 09 Location: Outpatient facility Ref. Prov.: Cephus Collin, MD       Interventional Therapy   Primary Reason for Admission: Surgical management of chronic pain condition.  Procedure:  Anesthesia, Analgesia, Anxiolysis:  Type: SPRINTTM Peripheral Nerve Field Stimulator (PNS) MicroLeadTM Implant Purpose: Therapeutic Region: Lumbar Level: L4-5 Facet Medial Branch Nerve Laterality: Right           Anesthesia: Local (1-2% Lidocaine )  Anxiolysis: Oral Valium 5 mg PO  Sedation: None  Guidance: Fluoroscopy & US  CPT (40981)    1. Lumbar facet arthropathy   2. Lumbar spondylosis    NAS-11 Pain score:   Pre-procedure: 2/10   Post-procedure: 0-No pain/10   H&P (Pre-op Assessment):  Mr. Coffing is a 73 y.o. (year old), male patient, seen today for interventional treatment. He  has a past surgical history that includes Melanoma excision (2008); Tonsillectomy; Radiology with anesthesia (N/A, 04/05/2020); Video bronchoscopy with endobronchial ultrasound (N/A, 06/17/2021); Bronchial needle aspiration biopsy (06/17/2021); IR IMAGING GUIDED PORT INSERTION (08/05/2021); Radiology with anesthesia (N/A, 08/22/2021); Radiology with anesthesia (N/A, 12/17/2021); Radiology with anesthesia (N/A, 04/08/2022); and Anterior cervical decomp/discectomy fusion  (N/A, 02/09/2023).  Initial Vital Signs:  Pulse/EKG Rate: 69  Temp: (!) 97.3 F (36.3 C) Resp: 16 BP: 114/63 SpO2: 100 %  BMI: Estimated body mass index is 21.93 kg/m as calculated from the following:   Height as of this encounter: 5\' 7"  (1.702 m).   Weight as of this encounter: 140 lb (63.5 kg).  Risk Assessment: Allergies: Reviewed. He is allergic to niaspan  [niacin ].  Allergy Precautions: None required Coagulopathies: Reviewed. None identified.  Blood-thinner therapy: None at this time Active Infection(s): Reviewed. None identified. Mr. Beaulieu is afebrile  Site Confirmation: Mr. Brown was asked to confirm the procedure and laterality before marking the site, which he did. Procedure checklist: Completed Consent: Before the procedure and under the influence of no sedative(s), amnesic(s), or anxiolytics, the patient was informed of the treatment options, risks and possible complications. To fulfill our ethical and legal obligations, as recommended by the American Medical Association's Code of Ethics, I have informed the patient of my clinical impression; the nature and purpose of the treatment or procedure; the risks, benefits, and possible complications of the intervention; the alternatives, including doing nothing; the risk(s) and benefit(s) of the alternative treatment(s) or procedure(s); and the risk(s) and benefit(s) of doing nothing.  Mr. Grudzinski was provided with information about the general risks and possible complications associated with most interventional procedures. These include, but are not limited to: failure to achieve desired goals, infection, bleeding, organ or nerve damage, allergic reactions, paralysis, and/or death.  In addition, he was informed of those risks and possible complications associated to this particular procedure, which include, but are not limited to: damage to the implant; failure to decrease pain; local, systemic, or serious CNS infections, intraspinal  abscess with possible cord compression and paralysis, or life-threatening such as meningitis; intrathecal and/or epidural bleeding with formation of hematoma with possible spinal cord compression and permanent paralysis;  organ damage; nerve injury or damage with subsequent sensory, motor, and/or autonomic system dysfunction, resulting in transient or permanent pain, numbness, and/or weakness of one or several areas of the body; allergic reactions, either minor or major life-threatening, such as anaphylactic or anaphylactoid reactions.  Furthermore, Mr. Renfroe was informed of those risks and complications associated with the medications. These include, but are not limited to: allergic reactions (i.e.: anaphylactic or anaphylactoid reactions); arrhythmia;  Hypotension/hypertension; cardiovascular collapse; respiratory depression and/or shortness of breath; swelling or edema; medication-induced neural toxicity; particulate matter embolism and blood vessel occlusion with resultant organ, and/or nervous system infarction and permanent paralysis.  Finally, he was informed that Medicine is not an exact science; therefore, there is also the possibility of unforeseen or unpredictable risks and/or possible complications that may result in a catastrophic outcome. The patient indicated having understood very clearly. We have given the patient no guarantees and we have made no promises. Enough time was given to the patient to ask questions, all of which were answered to the patient's satisfaction. Mr. Wehr has indicated that he wanted to continue with the procedure. Attestation: I, the ordering provider, attest that I have discussed with the patient the benefits, risks, side-effects, alternatives, likelihood of achieving goals, and potential problems during recovery for the procedure that I have provided informed consent. Date  Time: 07/08/2023  8:13 AM  Pre-Procedure Preparation:  Monitoring: As per clinic protocol.  Respiration, ETCO2, SpO2, BP, heart rate and rhythm monitor placed and checked for adequate function Safety Precautions: Patient was assessed for positional comfort and pressure points before starting the procedure. Time-out: I initiated and conducted the "Time-out" before starting the procedure, as per protocol. The patient was asked to participate by confirming the accuracy of the "Time Out" information. Verification of the correct person, site, and procedure were performed and confirmed by me, the nursing staff, and the patient. "Time-out" conducted as per Joint Commission's Universal Protocol (UP.01.01.01). Time: 0847 Start Time: 0847 hrs.  Description of Procedure Process:   Position: Prone Target Area: <1 cm from targeted nerve (Medial Branch Nerve) Approach: Posterior percutaneous, paramedial, interlaminar approach Area Prepped: Entire Lumbar Region Prepping solution: ChloraPrep (2% chlorhexidine  gluconate and 70% isopropyl alcohol) Safety Precautions: Safe injection practices and needle disposal techniques used. Medications properly checked for expiration dates. SDV (single dose vial) medications used. Aspiration looking for blood return and/or CSF was conducted prior to all injections. At no point did I inject any substances, as a needle was being advanced. No attempts were made at seeking any paresthesias.  Description of the Procedure (Lumbar Medial Branch):  Availability of a responsible, adult driver, and NPO status confirmed. Informed consent was obtained after having discussed risks and possible complications. An IV was started. The patient was then taken to the fluoroscopy suite, where the patient was placed in position for the procedure, over the fluoroscopy table. The patient was then monitored in the usual manner. Fluoroscopy was manipulated to obtain the best possible view of the target. Parallex error was corrected before commencing the procedure. Once a clear view of the target  had been obtained, the skin and subcutaneous tissue over the procedure site were infiltrated using lidocaine , loaded in a 10 cc luer-loc syringe with a 0.5 inch, 25-G needle. Care was taken to avoid numbing deeper tissues The introducer needle(s) was/were then inserted through the skin and deeper tissues, specifically the multifidus muscle.  An introducer needle and stimulating probe were  assembled, inserted and advanced along the intended course of  the medial branch nerve as it traverses the lamina medial and inferior to the zygapophyseal joint, taking care to maintain the proper depth of insertion as the introducer is advanced under fluoroscopic. The introducer needle was delivered to a location in proximity to the nerve. Multiple stimulation parameters were used to deliver stimulation to the medial branch nerve in concert with stimulating at multiple positions around the nerve. Nerve target acquisition was confirmed noting generation of paresthesias in the paravertebral regions corresponding to the level being stimulated as well as rhythmic thumping within the multifidi, the latter being further corroborated via palpation.  Various electrical parameter combinations were tested, and the lead location was adjusted (physically relocated) until the patient indicated paresthesia or muscle tension overlapping the distribution of the patient's typical region of pain.  The stimulating probe was removed from the introducer and a percutaneous lead was guided through the needle and delivered to a location in similar proximity to the nerve. Final  location was verified with electrical stimulation and documented with fluoroscopy & ultrasound.  The introducer needle was removed, and the exposed end of the percutaneous lead was attached to an external stimulator unit. Various electrical parameter combinations were again  tested until the patient indicated paresthesia or muscle tension  overlapping the distribution of the  patient's typical region of pain.  After confirming that lead impedance was in the normal range, the external unit was detached, the needle was removed, and the lead was anchored at the skin. The lead was threaded into the connector block and electrical continuity and desired patient response was confirmed. The connector block was attached to the external  stimulator unit.The site was covered with a sterile occlusive dressing and  a fluoroscopic and ultrasound image was taken to document final placement. The patient was observed for stability of vital signs and comfort.  Vitals:   07/08/23 0858 07/08/23 0903 07/08/23 0914 07/08/23 0919  BP: 132/75  (!) 118/105 (!) 86/67  Pulse: 67 68 68   Resp: 17 15 16    Temp:      TempSrc:      SpO2: 100% 100% 96%   Weight:      Height:       Start Time: 0847 hrs. End Time: 0903 hrs.  Imaging Guidance (Spinal):          Type of Imaging Technique: Fluoroscopy Guidance (Spinal) and ultrasound guidance to confirm multifidus activation Indication(s): Fluoroscopy guidance for needle placement to enhance accuracy in procedures requiring precise needle localization for targeted delivery of medication in or near specific anatomical locations not easily accessible without such real-time imaging assistance. Exposure Time: Please see nurses notes. Contrast: None used. Fluoroscopic Guidance: I was personally present during the use of fluoroscopy. "Tunnel Vision Technique" used to obtain the best possible view of the target area. Parallax error corrected before commencing the procedure. "Direction-depth-direction" technique used to introduce the needle under continuous pulsed fluoroscopy. Once target was reached, antero-posterior, oblique, and lateral fluoroscopic projection used confirm needle placement in all planes. Images permanently stored in EMR. Interpretation: No contrast injected. I personally interpreted the imaging intraoperatively. Adequate needle placement  confirmed in multiple planes. Permanent images saved into the patient's record.  Antibiotic Prophylaxis:   Anti-infectives (From admission, onward)    None      Indication(s): None identified  Post-operative Assessment:  Post-procedure Vital Signs:  Pulse/HCG Rate: 68  Temp: (!) 97.3 F (36.3 C) Resp: 16 BP: (!) 86/67 SpO2: 96 %  Complications: No immediate post-treatment complications  observed by team, or reported by patient.  Note: The patient tolerated the entire procedure well. A repeat set of vitals were taken after the procedure and the patient was kept under observation following institutional policy, for this type of procedure. Post-procedural neurological assessment was performed, showing return to baseline, prior to discharge. The patient was provided with post-procedure discharge instructions, including a section on how to identify potential problems. Should any problems arise concerning this procedure, the patient was given instructions to immediately contact us , at any time, without hesitation. In any case, we plan to contact the patient by telephone for a follow-up status report regarding this interventional procedure.  Comments:  No additional relevant information.  Plan of Care  Orders:  Orders Placed This Encounter  Procedures   DG PAIN CLINIC C-ARM 1-60 MIN NO REPORT    Intraoperative interpretation by procedural physician at [facility].    Standing Status:   Standing    Number of Occurrences:   1    Reason for exam::   Assistance in needle guidance and placement for procedures requiring needle placement in or near specific anatomical locations not easily accessible without such assistance.    Medications administered: We administered lidocaine , ropivacaine  (PF) 2 mg/mL (0.2%), and diazepam.  See the medical record for exact dosing, route, and time of administration.  Follow-up plan:   Return in about 2 weeks (around 07/22/2023) for Left L4 PNS.        BLF L3-5 06/03/23-right [employer] PNS L4 07/08/2023  Recent Visits Date Type Provider Dept  06/03/23 Procedure visit Cephus Collin, MD [facility]  05/12/23 Office Visit Cephus Collin, MD [facility]  Showing recent visits within past 90 days and meeting all other requirements Today's Visits Date Type Provider Dept  07/08/23 Procedure visit Cephus Collin, MD [facility]  Showing today's visits and meeting all other requirements Future Appointments Date Type Provider Dept  07/20/23 Appointment Cephus Collin, MD [facility]  Showing future appointments within next 90 days and meeting all other requirements  Disposition: Discharge home  Discharge (Date  Time): 07/08/2023; 0921 hrs.   Primary Care Physician: Austine Lefort, MD Location: [facility] Note by: Cephus Collin, MD (TTS technology used. I apologize for any typographical errors that were not detected and corrected.) Date: 07/08/2023; Time: 10:00 AM

## 2023-07-08 NOTE — Progress Notes (Signed)
 Safety precautions to be maintained throughout the outpatient stay will include: orient to surroundings, keep bed in low position, maintain call bell within reach at all times, provide assistance with transfer out of bed and ambulation.

## 2023-07-09 ENCOUNTER — Telehealth: Payer: Self-pay

## 2023-07-09 NOTE — Telephone Encounter (Signed)
 Attempt to contact for post-procedure follow-up. Left voicemail message.

## 2023-07-13 ENCOUNTER — Ambulatory Visit: Admitting: Urology

## 2023-07-13 ENCOUNTER — Encounter: Payer: Self-pay | Admitting: Hospice and Palliative Medicine

## 2023-07-14 ENCOUNTER — Ambulatory Visit: Admitting: Student in an Organized Health Care Education/Training Program

## 2023-07-14 ENCOUNTER — Ambulatory Visit: Admitting: Neurology

## 2023-07-14 NOTE — Telephone Encounter (Signed)
 I spoke with patient's wife.  My understanding is that Medicare reimbursement for lift chairs is limited.  However, I encouraged her to speak to patient's home health physical therapist regarding recommendations.  I am happy to prescribe if it would help.  Patient is being followed by pain management and she plans to speak with Dr. Rhesa Celeste regarding option for joint injections.  Patient has follow-up scheduled with me in 2 weeks.  Reportedly, patient is now mostly bed/chair bound.  He is not having significant improvement with physical therapy.

## 2023-07-20 ENCOUNTER — Ambulatory Visit
Attending: Student in an Organized Health Care Education/Training Program | Admitting: Student in an Organized Health Care Education/Training Program

## 2023-07-20 ENCOUNTER — Encounter: Payer: Self-pay | Admitting: Student in an Organized Health Care Education/Training Program

## 2023-07-20 ENCOUNTER — Ambulatory Visit
Admission: RE | Admit: 2023-07-20 | Discharge: 2023-07-20 | Disposition: A | Discharge status: Home / Self Care | Source: Ambulatory Visit | Attending: Student in an Organized Health Care Education/Training Program | Admitting: Student in an Organized Health Care Education/Training Program

## 2023-07-20 VITALS — BP 176/86 | HR 98 | Temp 98.4°F | Resp 16 | Ht 67.0 in | Wt 140.0 lb

## 2023-07-20 DIAGNOSIS — M47816 Spondylosis without myelopathy or radiculopathy, lumbar region: Secondary | ICD-10-CM | POA: Insufficient documentation

## 2023-07-20 DIAGNOSIS — G894 Chronic pain syndrome: Secondary | ICD-10-CM | POA: Diagnosis not present

## 2023-07-20 MED ORDER — LIDOCAINE HCL 2 % IJ SOLN
20.0000 mL | Freq: Once | INTRAMUSCULAR | Status: AC
  Administered 2023-07-20: 100 mg

## 2023-07-20 MED ORDER — ROPIVACAINE HCL 2 MG/ML IJ SOLN
9.0000 mL | Freq: Once | INTRAMUSCULAR | Status: AC
  Administered 2023-07-20: 9 mL via PERINEURAL

## 2023-07-20 NOTE — Progress Notes (Signed)
 PROVIDER NOTE: Interpretation of information contained herein should be left to medically-trained personnel. Specific patient instructions are provided elsewhere under "Patient Instructions" section of medical record. This document was created in part using STT-dictation technology, any transcriptional errors that may result from this process are unintentional.  Patient: Micheal Donovan Type: Established DOB: [birth year] MRN: [id number] PCP: Austine Lefort, MD  Service: Procedure DOS: 07/20/2023 Setting: Ambulatory Location: Ambulatory outpatient facility Delivery: Face-to-face Provider: Cephus Collin, MD Specialty: Interventional Pain Management Specialty designation: 09 Location: Outpatient facility Ref. Prov.: Austine Lefort, MD       Interventional Therapy   Primary Reason for Admission: Surgical management of chronic pain condition.  Procedure:  Anesthesia, Analgesia, Anxiolysis:  Type: SPRINTTM Peripheral Nerve Field Stimulator (PNS) MicroLeadTM Implant Purpose: Therapeutic Region: Lumbar Level: L4-5 Facet Medial Branch Nerve Laterality: Left           Anesthesia: Local (1-2% Lidocaine )  Sedation: None  Guidance: Fluoroscopy & US  CPT (08657)    1. Lumbar facet arthropathy   2. Lumbar spondylosis   3. Chronic pain syndrome    NAS-11 Pain score:   Pre-procedure: 2/10   Post-procedure: 3 /10   H&P (Pre-op Assessment):  Mr. Corlew is a 73 y.o. (year old), male patient, seen today for interventional treatment. He  has a past surgical history that includes Melanoma excision (2008); Tonsillectomy; Radiology with anesthesia (N/A, 04/05/2020); Video bronchoscopy with endobronchial ultrasound (N/A, 06/17/2021); Bronchial needle aspiration biopsy (06/17/2021); IR IMAGING GUIDED PORT INSERTION (08/05/2021); Radiology with anesthesia (N/A, 08/22/2021); Radiology with anesthesia (N/A, 12/17/2021); Radiology with anesthesia (N/A, 04/08/2022); and Anterior cervical decomp/discectomy fusion (N/A,  02/09/2023).  Initial Vital Signs:  Pulse/EKG Rate: 98ECG Heart Rate: 91 Temp: 98.4 F (36.9 C) Resp: 18 BP: 107/61 SpO2: 98 %  BMI: Estimated body mass index is 21.93 kg/m as calculated from the following:   Height as of this encounter: 5\' 7"  (1.702 m).   Weight as of this encounter: 140 lb (63.5 kg).  Risk Assessment: Allergies: Reviewed. He is allergic to niaspan  [niacin ].  Allergy Precautions: None required Coagulopathies: Reviewed. None identified.  Blood-thinner therapy: None at this time Active Infection(s): Reviewed. None identified. Mr. Stock is afebrile  Site Confirmation: Mr. Edgin was asked to confirm the procedure and laterality before marking the site, which he did. Procedure checklist: Completed Consent: Before the procedure and under the influence of no sedative(s), amnesic(s), or anxiolytics, the patient was informed of the treatment options, risks and possible complications. To fulfill our ethical and legal obligations, as recommended by the American Medical Association's Code of Ethics, I have informed the patient of my clinical impression; the nature and purpose of the treatment or procedure; the risks, benefits, and possible complications of the intervention; the alternatives, including doing nothing; the risk(s) and benefit(s) of the alternative treatment(s) or procedure(s); and the risk(s) and benefit(s) of doing nothing.  Mr. Yoshino was provided with information about the general risks and possible complications associated with most interventional procedures. These include, but are not limited to: failure to achieve desired goals, infection, bleeding, organ or nerve damage, allergic reactions, paralysis, and/or death.  In addition, he was informed of those risks and possible complications associated to this particular procedure, which include, but are not limited to: damage to the implant; failure to decrease pain; local, systemic, or serious CNS infections, intraspinal  abscess with possible cord compression and paralysis, or life-threatening such as meningitis; intrathecal and/or epidural bleeding with formation of hematoma with possible spinal cord compression and permanent  paralysis; organ damage; nerve injury or damage with subsequent sensory, motor, and/or autonomic system dysfunction, resulting in transient or permanent pain, numbness, and/or weakness of one or several areas of the body; allergic reactions, either minor or major life-threatening, such as anaphylactic or anaphylactoid reactions.  Furthermore, Mr. Sease was informed of those risks and complications associated with the medications. These include, but are not limited to: allergic reactions (i.e.: anaphylactic or anaphylactoid reactions); arrhythmia;  Hypotension/hypertension; cardiovascular collapse; respiratory depression and/or shortness of breath; swelling or edema; medication-induced neural toxicity; particulate matter embolism and blood vessel occlusion with resultant organ, and/or nervous system infarction and permanent paralysis.  Finally, he was informed that Medicine is not an exact science; therefore, there is also the possibility of unforeseen or unpredictable risks and/or possible complications that may result in a catastrophic outcome. The patient indicated having understood very clearly. We have given the patient no guarantees and we have made no promises. Enough time was given to the patient to ask questions, all of which were answered to the patient's satisfaction. Mr. Dains has indicated that he wanted to continue with the procedure. Attestation: I, the ordering provider, attest that I have discussed with the patient the benefits, risks, side-effects, alternatives, likelihood of achieving goals, and potential problems during recovery for the procedure that I have provided informed consent. Date  Time: 07/20/2023  8:12 AM  Pre-Procedure Preparation:  Monitoring: As per clinic protocol.  Respiration, ETCO2, SpO2, BP, heart rate and rhythm monitor placed and checked for adequate function Safety Precautions: Patient was assessed for positional comfort and pressure points before starting the procedure. Time-out: I initiated and conducted the "Time-out" before starting the procedure, as per protocol. The patient was asked to participate by confirming the accuracy of the "Time Out" information. Verification of the correct person, site, and procedure were performed and confirmed by me, the nursing staff, and the patient. "Time-out" conducted as per Joint Commission's Universal Protocol (UP.01.01.01). Time: 0900 Start Time: 0900 hrs.  Description of Procedure Process:   Position: Prone Target Area: <1 cm from targeted nerve (Medial Branch Nerve) Approach: Posterior percutaneous, paramedial, interlaminar approach Area Prepped: Entire Lumbar Region Prepping solution: ChloraPrep (2% chlorhexidine  gluconate and 70% isopropyl alcohol) Safety Precautions: Safe injection practices and needle disposal techniques used. Medications properly checked for expiration dates. SDV (single dose vial) medications used. Aspiration looking for blood return and/or CSF was conducted prior to all injections. At no point did I inject any substances, as a needle was being advanced. No attempts were made at seeking any paresthesias.  Description of the Procedure (Lumbar Medial Branch):  Availability of a responsible, adult driver, and NPO status confirmed. Informed consent was obtained after having discussed risks and possible complications. An IV was started. The patient was then taken to the fluoroscopy suite, where the patient was placed in position for the procedure, over the fluoroscopy table. The patient was then monitored in the usual manner. Fluoroscopy was manipulated to obtain the best possible view of the target. Parallex error was corrected before commencing the procedure. Once a clear view of the target  had been obtained, the skin and subcutaneous tissue over the procedure site were infiltrated using lidocaine , loaded in a 10 cc luer-loc syringe with a 0.5 inch, 25-G needle. Care was taken to avoid numbing deeper tissues The introducer needle(s) was/were then inserted through the skin and deeper tissues, specifically the multifidus muscle.  An introducer needle and stimulating probe were  assembled, inserted and advanced along the intended course  of the medial branch nerve as it traverses the lamina medial and inferior to the zygapophyseal joint, taking care to maintain the proper depth of insertion as the introducer is advanced under fluoroscopic. The introducer needle was delivered to a location in proximity to the nerve. Multiple stimulation parameters were used to deliver stimulation to the medial branch nerve in concert with stimulating at multiple positions around the nerve. Nerve target acquisition was confirmed noting generation of paresthesias in the paravertebral regions corresponding to the level being stimulated as well as rhythmic thumping within the multifidi, the latter being further corroborated via palpation.  Various electrical parameter combinations were tested, and the lead location was adjusted (physically relocated) until the patient indicated paresthesia or muscle tension overlapping the distribution of the patient's typical region of pain.  The stimulating probe was removed from the introducer and a percutaneous lead was guided through the needle and delivered to a location in similar proximity to the nerve. Final  location was verified with electrical stimulation and documented with fluoroscopy & ultrasound.  The introducer needle was removed, and the exposed end of the percutaneous lead was attached to an external stimulator unit. Various electrical parameter combinations were again  tested until the patient indicated paresthesia or muscle tension  overlapping the distribution of the  patient's typical region of pain.  After confirming that lead impedance was in the normal range, the external unit was detached, the needle was removed, and the lead was anchored at the skin. The lead was threaded into the connector block and electrical continuity and desired patient response was confirmed. The connector block was attached to the external  stimulator unit.The site was covered with a sterile occlusive dressing and  a fluoroscopic and ultrasound image was taken to document final placement. The patient was observed for stability of vital signs and comfort.  Vitals:   07/20/23 0815 07/20/23 0859 07/20/23 0905  BP: 107/61 (!) 153/77 (!) 176/86  Pulse: 98    Resp:  18 16  Temp: 98.4 F (36.9 C)    SpO2: 98% 95% 99%  Weight: 140 lb (63.5 kg)    Height: 5\' 7"  (1.702 m)     Start Time: 0900 hrs. End Time: 0910 hrs.  Imaging Guidance (Spinal):          Type of Imaging Technique: Fluoroscopy Guidance (Spinal) and ultrasound guidance to confirm multifidus activation Indication(s): Fluoroscopy guidance for needle placement to enhance accuracy in procedures requiring precise needle localization for targeted delivery of medication in or near specific anatomical locations not easily accessible without such real-time imaging assistance. Exposure Time: Please see nurses notes. Contrast: None used. Fluoroscopic Guidance: I was personally present during the use of fluoroscopy. "Tunnel Vision Technique" used to obtain the best possible view of the target area. Parallax error corrected before commencing the procedure. "Direction-depth-direction" technique used to introduce the needle under continuous pulsed fluoroscopy. Once target was reached, antero-posterior, oblique, and lateral fluoroscopic projection used confirm needle placement in all planes. Images permanently stored in EMR. Interpretation: No contrast injected. I personally interpreted the imaging intraoperatively. Adequate needle  placement confirmed in multiple planes. Permanent images saved into the patient's record.  Antibiotic Prophylaxis:   Anti-infectives (From admission, onward)    None      Indication(s): None identified  Post-operative Assessment:  Post-procedure Vital Signs:  Pulse/HCG Rate: 9894 Temp: 98.4 F (36.9 C) Resp: 16 BP: (!) 176/86 SpO2: 99 %  Complications: No immediate post-treatment complications observed by team, or reported by patient.  Note: The patient tolerated the entire procedure well. A repeat set of vitals were taken after the procedure and the patient was kept under observation following institutional policy, for this type of procedure. Post-procedural neurological assessment was performed, showing return to baseline, prior to discharge. The patient was provided with post-procedure discharge instructions, including a section on how to identify potential problems. Should any problems arise concerning this procedure, the patient was given instructions to immediately contact us , at any time, without hesitation. In any case, we plan to contact the patient by telephone for a follow-up status report regarding this interventional procedure.  Comments:  No additional relevant information.  Plan of Care  Orders:  Orders Placed This Encounter  Procedures   DG PAIN CLINIC C-ARM 1-60 MIN NO REPORT    Intraoperative interpretation by procedural physician at [facility].    Standing Status:   Standing    Number of Occurrences:   1    Reason for exam::   Assistance in needle guidance and placement for procedures requiring needle placement in or near specific anatomical locations not easily accessible without such assistance.    Medications administered: We administered lidocaine  and ropivacaine  (PF) 2 mg/mL (0.2%).  See the medical record for exact dosing, route, and time of administration.  Follow-up plan:   Return in about 8 weeks (around 09/14/2023) for PNS lead pull.        BLF L3-5 06/03/23-right Sprint PNS L4 07/08/2023, left L4 PNS Sprint 07/20/2023  Recent Visits Date Type Provider Dept  07/08/23 Procedure visit Cephus Collin, MD [facility]  06/03/23 Procedure visit Cephus Collin, MD [facility]  05/12/23 Office Visit Cephus Collin, MD [facility]  Showing recent visits within past 90 days and meeting all other requirements Today's Visits Date Type Provider Dept  07/20/23 Procedure visit Cephus Collin, MD [facility]  Showing today's visits and meeting all other requirements Future Appointments Date Type Provider Dept  09/15/23 Appointment Cephus Collin, MD [facility]  Showing future appointments within next 90 days and meeting all other requirements  Disposition: Discharge home  Discharge (Date  Time): 07/20/2023; 0930 hrs.   Primary Care Physician: Austine Lefort, MD Location: [facility] Note by: Cephus Collin, MD (TTS technology used. I apologize for any typographical errors that were not detected and corrected.) Date: 07/20/2023; Time: 10:00 AM

## 2023-07-20 NOTE — Progress Notes (Signed)
 Safety precautions to be maintained throughout the outpatient stay will include: orient to surroundings, keep bed in low position, maintain call bell within reach at all times, provide assistance with transfer out of bed and ambulation.

## 2023-07-21 ENCOUNTER — Telehealth: Payer: Self-pay | Admitting: *Deleted

## 2023-07-21 NOTE — Telephone Encounter (Signed)
 Post procedure call; reports that he is doing fine.  Reports his back has been hurting more and thinks it is from the procedure itself.  Encouraged to take what he typically would take for pain relief and use ice as tolerated.  Patient verbalizes u/o information.

## 2023-07-23 DIAGNOSIS — R3914 Feeling of incomplete bladder emptying: Secondary | ICD-10-CM | POA: Diagnosis not present

## 2023-07-23 DIAGNOSIS — R8271 Bacteriuria: Secondary | ICD-10-CM | POA: Diagnosis not present

## 2023-07-27 NOTE — Progress Notes (Unsigned)
 [facility]ASC[facility] OF [employer]  ASSESSMENT / PLAN: Micheal Donovan is a 73 y.o. male with atherosclerosis of native arteries of right lower extremity causing ulceration.  Recommend:  Abstinence from all tobacco products. Blood glucose control with goal A1c < 7%. Blood pressure control with goal blood pressure < 140/90 mmHg. Lipid reduction therapy with goal LDL-C <100 mg/dL Aspirin  81mg  PO QD.  Atorvastatin  40-80mg  PO QD (or other "high intensity" statin therapy).  Right great toe is nearly healed.  Will continue nonoperative approach.  Follow-up in 3 months with repeat noninvasive testing.  CHIEF COMPLAINT: Right great toe ulcer  HISTORY OF PRESENT ILLNESS: Micheal Donovan is a 73 y.o. male referred to clinic for evaluation of abnormal ankle-brachial index.  The patient is a fairly debilitated elderly gentleman with stage IV lung cancer.  He is minimally ambulatory, in need of assistance with most of his ADLs.  He is able to transfer with assistance.  He has significant neuropathy in his bilateral lower extremities.  He has a ulcer on his right great toe, which has been improving with local wound care.  He is here today with a health aide.  07/28/23: Patient returns to clinic for follow-up evaluation.  He is doing well overall.  He reports improvement in the right great toe ulcer.  Past Medical History:  Diagnosis Date   Allergy    Anxiety    BPH (benign prostatic hypertrophy)    Cancer (HCC)    Melanoma on Neck    2008   Carotid artery occlusion    CHF (congestive heart failure) (HCC)    Diabetes mellitus    type 2   ED (erectile dysfunction)    GERD (gastroesophageal reflux disease)    Hemorrhagic stroke (HCC) 06/2021   Hyperlipidemia    Hypertension    Lung cancer (HCC)    Neoplasm related pain    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)    Stroke [facility])     Past Surgical History:  Procedure Laterality Date   ANTERIOR CERVICAL DECOMP/DISCECTOMY FUSION N/A  02/09/2023   Procedure: C3-5 ANTERIOR CERVICAL DISCECTOMY AND FUSION;  Surgeon: Jodeen Munch, MD;  Location: [facility] ORS;  Service: Neurosurgery;  Laterality: N/A;   BRONCHIAL NEEDLE ASPIRATION BIOPSY  06/17/2021   Procedure: BRONCHIAL NEEDLE ASPIRATION BIOPSIES;  Surgeon: Josiah Nigh, MD;  Location: [facility] ENDOSCOPY;  Service: Pulmonary;;   IR IMAGING GUIDED PORT INSERTION  08/05/2021   MELANOMA EXCISION  2008   Left side of neck   RADIOLOGY WITH ANESTHESIA N/A 04/05/2020   Procedure: MRI SPINE WITOUT CONTRAST;  Surgeon: Radiologist, Medication, MD;  Location: [facility] OR;  Service: Radiology;  Laterality: N/A;   RADIOLOGY WITH ANESTHESIA N/A 08/22/2021   Procedure: MRI BRAIN WITH AND WITHOUT CONTRAST  WITH ANESTHESIA;  Surgeon: Radiologist, Medication, MD;  Location: [facility] OR;  Service: Radiology;  Laterality: N/A;   RADIOLOGY WITH ANESTHESIA N/A 12/17/2021   Procedure: MRI BRAIN WITH AND WITHOUT CONTRAST WITH ANESTHESIA; MRI LUMBER WITH AND WITHOUT CONTRAST;  Surgeon: Radiologist, Medication, MD;  Location: [facility] OR;  Service: Radiology;  Laterality: N/A;   RADIOLOGY WITH ANESTHESIA N/A 04/08/2022   Procedure: MRI LUMBER SPINE WITH AND WITHOUT CONTRAST;  Surgeon: Radiologist, Medication, MD;  Location: [facility] OR;  Service: Radiology;  Laterality: N/A;   TONSILLECTOMY     VIDEO BRONCHOSCOPY WITH ENDOBRONCHIAL ULTRASOUND N/A 06/17/2021   Procedure: VIDEO BRONCHOSCOPY WITH ENDOBRONCHIAL ULTRASOUND;  Surgeon: Josiah Nigh, MD;  Location: [facility] ENDOSCOPY;  Service: Pulmonary;  Laterality: N/A;    Family History  Problem Relation Age of Onset   COPD Mother    Clotting disorder Mother    Heart disease Father    Heart disease Brother        MI at age 45   Clotting disorder Maternal Grandmother    Stroke Neg Hx     Social History   Socioeconomic History   Marital status: Married    Spouse name: Not on file   Number of children: Not on file   Years of education: Not on file   Highest education level:  Associate degree: occupational, Scientist, product/process development, or vocational program  Occupational History   Not on file  Tobacco Use   Smoking status: Former    Current packs/day: 0.00    Types: Cigarettes    Quit date: 07/21/1990    Years since quitting: 33.0   Smokeless tobacco: Never  Vaping Use   Vaping status: Never Used  Substance and Sexual Activity   Alcohol use: No   Drug use: No   Sexual activity: Not Currently  Other Topics Concern   Not on file  Social History Narrative   Not on file   Social Drivers of Health   Financial Resource Strain: Low Risk  (05/27/2023)   Overall Financial Resource Strain (CARDIA)    Difficulty of Paying Living Expenses: Not very hard  Food Insecurity: No Food Insecurity (05/27/2023)   Hunger Vital Sign    Worried About Running Out of Food in the Last Year: Never true    Ran Out of Food in the Last Year: Never true  Transportation Needs: No Transportation Needs (05/27/2023)   PRAPARE - Administrator, Civil Service (Medical): No    Lack of Transportation (Non-Medical): No  Physical Activity: Unknown (05/27/2023)   Exercise Vital Sign    Days of Exercise per Week: 0 days    Minutes of Exercise per Session: Not on file  Stress: Stress Concern Present (05/27/2023)   [employer] of Occupational Health - Occupational Stress Questionnaire    Feeling of Stress : To some extent  Social Connections: Unknown (05/27/2023)   Social Connection and Isolation Panel [NHANES]    Frequency of Communication with Friends and Family: Once a week    Frequency of Social Gatherings with Friends and Family: Patient declined    Attends Religious Services: Patient unable to answer    Active Member of Clubs or Organizations: No    Attends Banker Meetings: Patient unable to answer    Marital Status: Married  Catering manager Violence: Not At Risk (04/22/2023)   Humiliation, Afraid, Rape, and Kick questionnaire    Fear of Current or Ex-Partner: No     Emotionally Abused: No    Physically Abused: No    Sexually Abused: No    Allergies  Allergen Reactions   Niaspan  [Niacin ]     FLUSHING    Current Outpatient Medications  Medication Sig Dispense Refill   ALPRAZolam  (XANAX ) 0.5 MG tablet Take 1 tablet (0.5 mg total) by mouth at bedtime as needed for anxiety. 15 tablet 0   celecoxib  (CELEBREX ) 200 MG capsule Take 1 capsule (200 mg total) by mouth 2 (two) times daily. 60 capsule 0   citalopram  (CELEXA ) 10 MG tablet Take 1 tablet (10 mg total) by mouth daily. 30 tablet 3   docusate sodium  (COLACE) 100 MG capsule Take 100 mg by mouth 2 (two) times daily as needed for mild constipation.  fentaNYL  (DURAGESIC ) 75 MCG/HR Place 1 patch onto the skin every 3 (three) days. 10 patch 0   gabapentin  (NEURONTIN ) 300 MG capsule Take 2 capsules (600 mg total) by mouth 2 (two) times daily. 120 capsule 2   insulin  glargine (LANTUS ) 100 UNIT/ML injection Inject 0-30 Units into the skin daily as needed (High Blood Sugar over 150).     Insulin  Pen Needle (B-D UF III MINI PEN NEEDLES) 31G X 5 MM MISC Use to inject insulin  daily. 100 each 3   levETIRAcetam  (KEPPRA ) 500 MG tablet Take 1 tablet (500 mg total) by mouth 2 (two) times daily. 180 tablet 3   megestrol  (MEGACE ) 40 MG tablet Take 1 tablet (40 mg total) by mouth daily. 30 tablet 3   methocarbamol  (ROBAXIN ) 500 MG tablet Take 1 tablet (500 mg total) by mouth every 6 (six) hours as needed for muscle spasms. 120 tablet 0   NOVOLOG  FLEXPEN 100 UNIT/ML FlexPen Inject 0-10 Units into the skin daily as needed for high blood sugar (BG > 200).     Nystatin  (GERHARDT'S BUTT CREAM) CREA Apply 14 Applications topically 2 (two) times daily. 1 each 0   omeprazole  (PRILOSEC) 20 MG capsule Take 20 mg by mouth daily.     oxyCODONE -acetaminophen  (PERCOCET) 7.5-325 MG tablet Take 1 tablet by mouth every 4 (four) hours as needed for severe pain (pain score 7-10). Do not take with cough medication or other pain medication 60  tablet 0   senna (SENOKOT) 8.6 MG TABS tablet Take 1 tablet (8.6 mg total) by mouth 2 (two) times daily as needed for mild constipation. 30 tablet 0   tamsulosin  (FLOMAX ) 0.4 MG CAPS capsule TAKE 1 CAPSULE(0.4 MG) BY MOUTH DAILY 30 capsule 2   No current facility-administered medications for this visit.   Facility-Administered Medications Ordered in Other Visits  Medication Dose Route Frequency Provider Last Rate Last Admin   heparin  lock flush 100 UNIT/ML injection            sodium chloride  flush (NS) 0.9 % injection 10 mL  10 mL Intracatheter PRN [name], MD   10 mL at 07/07/23 1434    PHYSICAL EXAM Vitals:   07/28/23 1339  BP: 102/64  Pulse: 81  Temp: 98.2 F (36.8 C)  SpO2: 95%     Elderly man in no distress. In a wheelchair, deconditioned Regular rate and rhythm Unlabored breathing Chronic Foley in place No palpable pedal pulses in lower extremities Right great toe ulcer has nearly resolved  PERTINENT LABORATORY AND RADIOLOGIC DATA  Most recent CBC    Latest Ref Rng & Units 07/07/2023   12:37 PM 06/16/2023    1:28 PM 05/26/2023   12:55 PM  CBC  WBC 4.0 - 10.5 K/uL 7.4  8.6  6.5   Hemoglobin 13.0 - 17.0 g/dL 96.0  45.4  09.8   Hematocrit 39.0 - 52.0 % 37.9  39.7  38.4   Platelets 150 - 400 K/uL 270  264  302      Most recent CMP    Latest Ref Rng & Units 07/07/2023   12:37 PM 06/16/2023    1:28 PM 05/26/2023   12:55 PM  CMP  Glucose 70 - 99 mg/dL 119  147  829   BUN 8 - 23 mg/dL 18  16  16    Creatinine 0.61 - 1.24 mg/dL 5.62  1.30  8.65   Sodium 135 - 145 mmol/L 135  131  136   Potassium 3.5 - 5.1 mmol/L  3.8  3.9  3.9   Chloride 98 - 111 mmol/L 101  98  101   CO2 22 - 32 mmol/L 24  25  24    Calcium  8.9 - 10.3 mg/dL 8.3  8.5  8.8   Total Protein 6.5 - 8.1 g/dL 6.3  6.6  6.9   Total Bilirubin 0.0 - 1.2 mg/dL 0.5  0.7  0.8   Alkaline Phos 38 - 126 U/L 68  64  61   AST 15 - 41 U/L 18  17  20    ALT 0 - 44 U/L 9  9  11      Renal function CrCl cannot be  calculated (Patient's most recent lab result is older than the maximum 21 days allowed.).  Hgb A1c MFr Bld (%)  Date Value  10/01/2022 6.6 (H)    LDL Cholesterol (Calc)  Date Value Ref Range Status  04/29/2017 81 mg/dL (calc) Final    Comment:    Reference range: <100 . Desirable range <100 mg/dL for primary prevention;   <70 mg/dL for patients with CHD or diabetic patients  with > or = 2 CHD risk factors. Aaron Aas LDL-C is now calculated using the Martin-Hopkins  calculation, which is a validated novel method providing  better accuracy than the Friedewald equation in the  estimation of LDL-C.  Melinda Sprawls et al. Erroll Heard. 1610;960(45): 2061-2068  (http://education.[facility].com/faq/FAQ164)    LDL Cholesterol  Date Value Ref Range Status  10/02/2022 88 0 - 99 mg/dL Final    Comment:           Total Cholesterol/HDL:CHD Risk Coronary Heart Disease Risk Table                     Men   Women  1/2 Average Risk   3.4   3.3  Average Risk       5.0   4.4  2 X Average Risk   9.6   7.1  3 X Average Risk  23.4   11.0        Use the calculated Patient Ratio above and the CHD Risk Table to determine the patient's CHD Risk.        ATP III CLASSIFICATION (LDL):  <100     mg/dL   Optimal  409-811  mg/dL   Near or Above                    Optimal  130-159  mg/dL   Borderline  914-782  mg/dL   High  >956     mg/dL   Very High Performed at [facility], [address] [place], [place] [address]      +-------+-----------+-----------+------------+------------+  ABI/TBIToday's ABIToday's TBIPrevious ABIPrevious TBI  +-------+-----------+-----------+------------+------------+  Right 0.67       0                                    +-------+-----------+-----------+------------+------------+  Left  0.77       0.16                                 +-------+-----------+-----------+------------+------------+    Heber Little. Edgardo Goodwill, MD [facility] Vascular and Vein  Specialists of [facility] Phone Number: [phone] 07/28/2023 4:10 PM   Total time spent on preparing this encounter including chart review, data review, collecting history,  examining the patient, coordinating care for this established patient, 30 minutes  Portions of this report may have been transcribed using voice recognition software.  Every effort has been made to ensure accuracy; however, inadvertent computerized transcription errors may still be present.

## 2023-07-28 ENCOUNTER — Ambulatory Visit: Attending: Vascular Surgery | Admitting: Vascular Surgery

## 2023-07-28 ENCOUNTER — Encounter: Payer: Self-pay | Admitting: Vascular Surgery

## 2023-07-28 ENCOUNTER — Telehealth: Payer: Self-pay

## 2023-07-28 VITALS — BP 102/64 | HR 81 | Temp 98.2°F

## 2023-07-28 DIAGNOSIS — I70235 Atherosclerosis of native arteries of right leg with ulceration of other part of foot: Secondary | ICD-10-CM

## 2023-07-28 NOTE — Telephone Encounter (Signed)
 Telephone call from patients wife stating that since the SCS has been placed patient is having pain when he turns a certain way. Reports a lot of bleeding after procedure, not bleeding now. Wife reports that patient has had the stimulator cut off for the past 2 days. Appointment made for 08/06/23.

## 2023-07-28 NOTE — Telephone Encounter (Signed)
 Patient is coming in around 1130 to have stimulator removed

## 2023-07-28 NOTE — Telephone Encounter (Signed)
 This patinet is coming in at 1120 to have his stimulator taken out although I see now that it is a PNS instead of a SCS.

## 2023-07-29 ENCOUNTER — Inpatient Hospital Stay (HOSPITAL_BASED_OUTPATIENT_CLINIC_OR_DEPARTMENT_OTHER): Admitting: Hospice and Palliative Medicine

## 2023-07-29 ENCOUNTER — Other Ambulatory Visit: Payer: Self-pay

## 2023-07-29 ENCOUNTER — Encounter: Payer: Self-pay | Admitting: Oncology

## 2023-07-29 ENCOUNTER — Emergency Department
Admission: EM | Admit: 2023-07-29 | Discharge: 2023-07-29 | Disposition: A | Discharge status: Home / Self Care | Attending: Emergency Medicine | Admitting: Emergency Medicine

## 2023-07-29 ENCOUNTER — Emergency Department

## 2023-07-29 ENCOUNTER — Inpatient Hospital Stay

## 2023-07-29 ENCOUNTER — Telehealth: Payer: Self-pay | Admitting: *Deleted

## 2023-07-29 ENCOUNTER — Encounter: Payer: Self-pay | Admitting: Student in an Organized Health Care Education/Training Program

## 2023-07-29 ENCOUNTER — Ambulatory Visit (HOSPITAL_BASED_OUTPATIENT_CLINIC_OR_DEPARTMENT_OTHER): Admitting: Student in an Organized Health Care Education/Training Program

## 2023-07-29 ENCOUNTER — Inpatient Hospital Stay (HOSPITAL_BASED_OUTPATIENT_CLINIC_OR_DEPARTMENT_OTHER): Admitting: Oncology

## 2023-07-29 VITALS — BP 130/64 | HR 81 | Temp 96.8°F | Resp 18 | Wt 140.8 lb

## 2023-07-29 VITALS — BP 161/82 | HR 93 | Temp 97.5°F | Resp 16 | Ht 67.0 in | Wt 140.0 lb

## 2023-07-29 VITALS — BP 156/78 | HR 78 | Temp 97.4°F | Resp 19

## 2023-07-29 DIAGNOSIS — G893 Neoplasm related pain (acute) (chronic): Secondary | ICD-10-CM | POA: Diagnosis not present

## 2023-07-29 DIAGNOSIS — M47816 Spondylosis without myelopathy or radiculopathy, lumbar region: Secondary | ICD-10-CM | POA: Insufficient documentation

## 2023-07-29 DIAGNOSIS — C7951 Secondary malignant neoplasm of bone: Secondary | ICD-10-CM | POA: Diagnosis not present

## 2023-07-29 DIAGNOSIS — I509 Heart failure, unspecified: Secondary | ICD-10-CM | POA: Insufficient documentation

## 2023-07-29 DIAGNOSIS — R569 Unspecified convulsions: Secondary | ICD-10-CM

## 2023-07-29 DIAGNOSIS — M549 Dorsalgia, unspecified: Secondary | ICD-10-CM | POA: Diagnosis not present

## 2023-07-29 DIAGNOSIS — G8929 Other chronic pain: Secondary | ICD-10-CM

## 2023-07-29 DIAGNOSIS — K5903 Drug induced constipation: Secondary | ICD-10-CM | POA: Diagnosis not present

## 2023-07-29 DIAGNOSIS — G894 Chronic pain syndrome: Secondary | ICD-10-CM | POA: Insufficient documentation

## 2023-07-29 DIAGNOSIS — I110 Hypertensive heart disease with heart failure: Secondary | ICD-10-CM | POA: Diagnosis not present

## 2023-07-29 DIAGNOSIS — C3412 Malignant neoplasm of upper lobe, left bronchus or lung: Secondary | ICD-10-CM | POA: Diagnosis not present

## 2023-07-29 DIAGNOSIS — Z515 Encounter for palliative care: Secondary | ICD-10-CM

## 2023-07-29 DIAGNOSIS — Z5112 Encounter for antineoplastic immunotherapy: Secondary | ICD-10-CM | POA: Diagnosis not present

## 2023-07-29 DIAGNOSIS — K5900 Constipation, unspecified: Secondary | ICD-10-CM | POA: Diagnosis not present

## 2023-07-29 DIAGNOSIS — E119 Type 2 diabetes mellitus without complications: Secondary | ICD-10-CM | POA: Insufficient documentation

## 2023-07-29 DIAGNOSIS — G629 Polyneuropathy, unspecified: Secondary | ICD-10-CM | POA: Diagnosis not present

## 2023-07-29 LAB — CBC WITH DIFFERENTIAL/PLATELET
Abs Immature Granulocytes: 0.04 10*3/uL (ref 0.00–0.07)
Basophils Absolute: 0.1 10*3/uL (ref 0.0–0.1)
Basophils Relative: 1 %
Eosinophils Absolute: 0.8 10*3/uL — ABNORMAL HIGH (ref 0.0–0.5)
Eosinophils Relative: 10 %
HCT: 37.8 % — ABNORMAL LOW (ref 39.0–52.0)
Hemoglobin: 11.9 g/dL — ABNORMAL LOW (ref 13.0–17.0)
Immature Granulocytes: 1 %
Lymphocytes Relative: 15 %
Lymphs Abs: 1.2 10*3/uL (ref 0.7–4.0)
MCH: 28.4 pg (ref 26.0–34.0)
MCHC: 31.5 g/dL (ref 30.0–36.0)
MCV: 90.2 fL (ref 80.0–100.0)
Monocytes Absolute: 0.7 10*3/uL (ref 0.1–1.0)
Monocytes Relative: 8 %
Neutro Abs: 5.4 10*3/uL (ref 1.7–7.7)
Neutrophils Relative %: 65 %
Platelets: 325 10*3/uL (ref 150–400)
RBC: 4.19 MIL/uL — ABNORMAL LOW (ref 4.22–5.81)
RDW: 14.2 % (ref 11.5–15.5)
WBC: 8.2 10*3/uL (ref 4.0–10.5)
nRBC: 0 % (ref 0.0–0.2)

## 2023-07-29 LAB — COMPREHENSIVE METABOLIC PANEL WITH GFR
ALT: 10 U/L (ref 0–44)
AST: 19 U/L (ref 15–41)
Albumin: 2.9 g/dL — ABNORMAL LOW (ref 3.5–5.0)
Alkaline Phosphatase: 64 U/L (ref 38–126)
Anion gap: 6 (ref 5–15)
BUN: 23 mg/dL (ref 8–23)
CO2: 24 mmol/L (ref 22–32)
Calcium: 7.9 mg/dL — ABNORMAL LOW (ref 8.9–10.3)
Chloride: 104 mmol/L (ref 98–111)
Creatinine, Ser: 0.78 mg/dL (ref 0.61–1.24)
GFR, Estimated: >60 mL/min (ref >60)
Glucose, Bld: 110 mg/dL — ABNORMAL HIGH (ref 70–99)
Potassium: 4.2 mmol/L (ref 3.5–5.1)
Sodium: 134 mmol/L — ABNORMAL LOW (ref 135–145)
Total Bilirubin: 0.7 mg/dL (ref 0.0–1.2)
Total Protein: 6.1 g/dL — ABNORMAL LOW (ref 6.5–8.1)

## 2023-07-29 MED ORDER — MELOXICAM 7.5 MG PO TABS: 7.5000 mg | ORAL_TABLET | Freq: Every day | ORAL | 3 refills | Status: DC

## 2023-07-29 MED ORDER — SODIUM CHLORIDE 0.9 % IV SOLN
200.0000 mg | Freq: Once | INTRAVENOUS | Status: AC
  Administered 2023-07-29: 200 mg via INTRAVENOUS
  Filled 2023-07-29: qty 8

## 2023-07-29 MED ORDER — SODIUM CHLORIDE 0.9 % IV SOLN
Freq: Once | INTRAVENOUS | Status: AC
  Filled 2023-07-29: qty 250

## 2023-07-29 MED ORDER — LACTULOSE 10 GM/15ML PO SOLN: 10.0000 g | Freq: Three times a day (TID) | ORAL | 0 refills | Status: DC | PRN

## 2023-07-29 MED ORDER — SMOG ENEMA
400.0000 mL | Freq: Once | RECTAL | Status: DC
  Filled 2023-07-29: qty 960

## 2023-07-29 MED ORDER — HEPARIN SOD (PORK) LOCK FLUSH 100 UNIT/ML IV SOLN
500.0000 [IU] | Freq: Once | INTRAVENOUS | Status: AC | PRN
  Administered 2023-07-29: 500 [IU]
  Filled 2023-07-29: qty 5

## 2023-07-29 MED ORDER — DULCOLAX 5 MG PO TBEC
10.0000 mg | DELAYED_RELEASE_TABLET | Freq: Once | ORAL | 0 refills | Status: AC
Start: 2023-07-29 — End: 2023-07-29

## 2023-07-29 NOTE — Progress Notes (Signed)
 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  Patient receiving keytruda   Met with patient and wife in infusion.  Patient reports that appetite is good.  Eating [employer] burgers and fries, [place] [employer] steak and cheese. Likes fun size Payday, Designer, television/film set.  Does not like oral nutrition supplements  Seeing pain management for lower back pain (peripheral nerve stimulator placed)  Medications: reviewed  Labs: reviewed  Anthropometrics:   Weight 140 lb 12.8 oz today in wheelchair  124 lb 8 oz on 3/25 (in wheelchair per wife)  Wife confirms that patient has gained weight   NUTRITION DIAGNOSIS: Inadequate oral intake improved     INTERVENTION:  Continue high calories and protein to maintain weight     MONITORING, EVALUATION, GOAL: weight trends, intake   NEXT VISIT: as needed  Muranda Coye B. Zollie Hipp, CSO, LDN Registered Dietitian [phone]

## 2023-07-29 NOTE — Telephone Encounter (Signed)
 I called and spoke to patient's wife and patient. Pt's insurance will not over a patient transfer chair. The insurance will cover a [employer]. Patient and wife are not interested in a hoyer lift and does not want to purse the hoyer at this time.

## 2023-07-29 NOTE — Assessment & Plan Note (Addendum)
 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  S/p lung radiation to left lung.  January 2025, CT and PET scan results were reviewed. Stable disease. No progression or new disease.  Labs are reviewed and discussed with patient. I recommend to continue Keytruda   Radiation oncology has ordered his next CT scan scheduled in July 2025.  He has poor PS and will not be able to tolerate other aggressive treatment. Shared decision was made not to order another CT prior to July.

## 2023-07-29 NOTE — Assessment & Plan Note (Signed)
 Back pain, MRI lumbar showed DJD, L4-L5 synovial cyst,PET scan showed L4 fracture Continue PRN percocet 7.5mg /325mg , fentanyl  patch 75mcg Q72 hours Follow up with palliative care

## 2023-07-29 NOTE — Progress Notes (Signed)
 Palliative Medicine [facility] at [facility] [phone] [phone]   Name: Micheal Donovan Date: 07/29/2023 MRN: [id number]  DOB: 27-Sep-1950  Patient Care Team: Austine Lefort, MD as PCP - General (Family Medicine) Amanda Jungling Tomas Fountain, MD as PCP - Cardiology (Cardiology) Drake Gens, RN as Oncology Nurse Navigator [name], MD as Consulting Physician (Oncology) Bridgette Campus, MD as Consulting Physician (Cardiology) Arminda Berth, OD (Optometry) Adelaide Adjutant, MD as Consulting Physician (Physical Medicine and Rehabilitation) [name], MD as Consulting Physician (Oncology) Glenis Langdon, MD as Consulting Physician (Radiation Oncology)    REASON FOR CONSULTATION: Micheal Donovan is a 73 y.o. male with multiple medical problems including non-small cell lung cancer, history of subacute intraparenchymal hemorrhage, PE.  Patient is status post XRT.  He is on systemic chemo.  He is referred to palliative care to address goals and manage ongoing symptoms.  SOCIAL HISTORY:     reports that he quit smoking about 33 years ago. His smoking use included cigarettes. He has never used smokeless tobacco. He reports that he does not drink alcohol and does not use drugs.  Patient is married.  He has a daughter who is his primary caregiver.  Patient previously worked as an Lexicographer  ADVANCE DIRECTIVES:  On file  CODE STATUS:   PAST MEDICAL HISTORY: Past Medical History:  Diagnosis Date   Allergy    Anxiety    BPH (benign prostatic hypertrophy)    Cancer (HCC)    Melanoma on Neck    2008   Carotid artery occlusion    CHF (congestive heart failure) (HCC)    Diabetes mellitus    type 2   ED (erectile dysfunction)    GERD (gastroesophageal reflux disease)    Hemorrhagic stroke (HCC) 06/2021   Hyperlipidemia    Hypertension    Lung cancer (HCC)    Neoplasm related pain    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)    Stroke  [facility])     PAST SURGICAL HISTORY:  Past Surgical History:  Procedure Laterality Date   ANTERIOR CERVICAL DECOMP/DISCECTOMY FUSION N/A 02/09/2023   Procedure: C3-5 ANTERIOR CERVICAL DISCECTOMY AND FUSION;  Surgeon: Jodeen Munch, MD;  Location: [facility] ORS;  Service: Neurosurgery;  Laterality: N/A;   BRONCHIAL NEEDLE ASPIRATION BIOPSY  06/17/2021   Procedure: BRONCHIAL NEEDLE ASPIRATION BIOPSIES;  Surgeon: Josiah Nigh, MD;  Location: [facility] ENDOSCOPY;  Service: Pulmonary;;   IR IMAGING GUIDED PORT INSERTION  08/05/2021   MELANOMA EXCISION  2008   Left side of neck   RADIOLOGY WITH ANESTHESIA N/A 04/05/2020   Procedure: MRI SPINE WITOUT CONTRAST;  Surgeon: Radiologist, Medication, MD;  Location: [facility] OR;  Service: Radiology;  Laterality: N/A;   RADIOLOGY WITH ANESTHESIA N/A 08/22/2021   Procedure: MRI BRAIN WITH AND WITHOUT CONTRAST  WITH ANESTHESIA;  Surgeon: Radiologist, Medication, MD;  Location: [facility] OR;  Service: Radiology;  Laterality: N/A;   RADIOLOGY WITH ANESTHESIA N/A 12/17/2021   Procedure: MRI BRAIN WITH AND WITHOUT CONTRAST WITH ANESTHESIA; MRI LUMBER WITH AND WITHOUT CONTRAST;  Surgeon: Radiologist, Medication, MD;  Location: [facility] OR;  Service: Radiology;  Laterality: N/A;   RADIOLOGY WITH ANESTHESIA N/A 04/08/2022   Procedure: MRI LUMBER SPINE WITH AND WITHOUT CONTRAST;  Surgeon: Radiologist, Medication, MD;  Location: [facility] OR;  Service: Radiology;  Laterality: N/A;   TONSILLECTOMY     VIDEO BRONCHOSCOPY WITH ENDOBRONCHIAL ULTRASOUND N/A 06/17/2021   Procedure: VIDEO BRONCHOSCOPY WITH ENDOBRONCHIAL ULTRASOUND;  Surgeon: Felipe Horton,  Tino Foreman, MD;  Location: [facility] ENDOSCOPY;  Service: Pulmonary;  Laterality: N/A;    HEMATOLOGY/ONCOLOGY HISTORY:  Oncology History Overview Note  Diagnosis: Stage IIB T2b N1 M0 adenocarcinoma of the LUL, poorly differentiated     Primary non-small cell carcinoma of upper lobe of left lung (HCC)  06/13/2021 Initial Diagnosis   Primary non-small cell carcinoma of upper  lobe of left lung [facility]) -06/20/2021 - 06/27/2021, patient presented to [facility] due to progressive headache/dizziness/gait changes. CT head showed acute to subacute intraparenchymal hemorrhage involving the left cerebellum.  Surrounding low-density vasogenic edema.  Patient was transferred to [facility]. This was further evaluated by CT angiogram of the neck which showed bulky calcified plaques/stenosis of carotid artery, Followed by MRI brain. 06/12/2021, MRI of the brain showed no significant interval change in size of the left cerebellar intraparenchymal hematoma with unchanged regional mass effect and partial effacement of fourth ventricle but no upstream hydrocephalus. There is no discernible enhancement to suggest underlying mass lesion, though acute blood products could mask enhancement.   06/12/2021 a chest x-ray showed a 4.4 cm left middle lobe. 06/13/2021, CT chest with contrast showed a 4.3 x 3.6 x 3.3 cm lobular spiculated mass in the posterior left upper lobe with T3 to the lateral pleura and major fissure.  Metastatic left hilar lymphadenopathy.  Peripheral micronodularity posterior right costophrenic sulcus.  Aortic atherosclerosis. 06/14/2021, CT abdomen pelvis showed right lower lobe pulmonary artery embolus.  No evidence of right heart strain.  No acute intra-abdominal or pelvic pathology.  Aortic atherosclerosis.  06/13/2021, patient underwent bronchoscopy with EBUS by Dr. Felipe Horton.  Biopsy from the fine-needle aspiration of station 11 mL showed malignant cells, consistent with poorly differentiated non-small cell carcinoma, consistent with adenocarcinoma.  Malignant cells are TTF-1 positive and negative for p40.  Negative for neuroendocrine markers.  Blood test and tissue sample were tested for Gardant 360  -PD-L1 TPS 97%, no actionable mutation on the blood testing. 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   07/17/2021 Cancer Staging   Staging form: Lung, AJCC 8th  Edition - Clinical: Stage IV (cT2b, cN1, cM1) - Signed by [name], MD on 08/06/2021   08/06/2021 Imaging   I saw patient's PET scan after his visit with me on 08/06/21. PET scan was ordered by his previous oncologist group and did not come to my in basket.. Patient received cycle 1 carboplatin  Taxol  today.  He has started on radiation.  5/26/3 PET scan showed 3.8 cm hypermetabolic left upper lobe mass with hypermetabolic left hilar and infrahilar adenopathy.  There is approximately 8 scattered metastatic lesions in the skeleton. Mixed density photopenic lesion in the left cerebellum. characterized as hemorrhage on recent prior imaging workups.    08/16/2021 -  Chemotherapy   Patient is on Treatment Plan : LUNG Carboplatin  (5) + Pemetrexed  + Pembrolizumab  (200) D1 q21d Induction x 4 cycles / Maintenance Pemetrexed  + Pembrolizumab  (200) D1 q21d      08/22/2021 Imaging   MRI brain w wo contrast  Decrease in size of left cerebellar hematoma with resolution of edema. No evidence of underlying lesion. Smaller, more recent hemorrhage in the left cerebellar vermis with minimal edema. There is minimal enhancement without definite evidence of underlying lesion.  New punctate focus of chronic blood products and enhancement in the left frontal lobe. Additional new foci of chronic blood products in the posterior right putamen, right parietal subcortical white matter, and posterior right cerebellum. Unclear at this time if these represent foci  of bland hemorrhage or early metastases.   Increase in size of right parietal osseous metastasis with minor extraosseous extension.     Imaging   PET scan showed 1. Interval response to therapy as evidenced by a small residual left upper lobe nodule with decreased hypermetabolism, no residual hypermetabolic adenopathy and decreased hypermetabolism associatedwith osseous metastases. 2. 6 mm posterior left upper lobe nodule, likely stable. Recommend attention on  follow-up. 3. Aortic atherosclerosis (ICD10-I70.0). Coronary artery calcification.   12/17/2021 Imaging   MRI lumbar spine w wo contrast  1. No evidence of regional metastatic disease. 2. Late subacute superior endplate fracture at L2 and large superior endplate Schmorl's node at L5, unchanged since the PET scan of 10/29/2021. 3. L3-4: Shallow disc protrusion. Facet and ligamentous hypertrophy. Stenosis of both lateral recesses. Findings slightly worsened since 2022. 4. L4-5: Shallow disc protrusion. Facet and ligamentous hypertrophy. Small synovial cyst arising from the facet joint on the right. Stenosis of the lateral recesses right worse than left. Foraminal narrowing right worse than left. Findings have worsened since 2022.  5. L5-S1: Endplate osteophytes and shallow protrusion of the disc. Facet and ligamentous hypertrophy. Stenosis of the subarticular lateral recesses and neural foramina, right worse than left. Similar appearance to the study of 2022. 6. Continued evidence of enteritis of at least 1 loop of small bowel. Distended bladder present   12/18/2021 Imaging   Brian MRI w wo  1. Interval decrease in size of the left cerebellar and vermian hematomas. No definite evidence of underlying metastatic lesion. 2. There is are two possible contrast enhancing lesions in the posterior left frontal lobe and left occipital lobe, which do not have intrinsic T1 signal abnormality, but have a somewhat linear appearance and may be vascular in nature. Recommend attention on follow up. 3. Multifocal sites of susceptibility artifact are redemonstrated with interval development of a few new foci, as described above. All of these sites demonstrate intrinsic T1 hyperintense signal abnormality and susceptibility artifact and are compatible with sites of microhemorrhages. Recommend continued attention on follow up. 4. Interval decrease in size of right parietal calvarial metastatic lesion. No new contrast  enhancing lesions visualized. 5. Diffusely heterogeneous marrow signal throughout the cervical spine, which is nonspecific but can be seen in the setting of anemia, smoking, obesity, or a marrow replacement process.     12/18/2021 Imaging   MRI lumbar spine w wo contrast  1. No evidence of regional metastatic disease. 2. Late subacute superior endplate fracture at L2 and large superior endplate Schmorl's node at L5, unchanged since the PET scan of 10/29/2021 3. L3-4: Shallow disc protrusion. Facet and ligamentous hypertrophy.Stenosis of both lateral recesses. Findings slightly worsened since 2022. 4. L4-5: Shallow disc protrusion. Facet and ligamentous hypertrophy. Small synovial cyst arising from the facet joint on the right. Stenosis of the lateral recesses right worse than left. Foraminal narrowing right worse than left. Findings have worsened since 2022. 5. L5-S1: Endplate osteophytes and shallow protrusion of the disc. Facet and ligamentous hypertrophy. Stenosis of the subarticular lateral recesses and neural foramina, right worse than left. Similar appearance to the study of 2022. 6. Continued evidence of enteritis of at least 1 loop of small bowel. Distended bladder present.       01/28/2022 Imaging   CT chest abdomen pelvis w contrast 1. Treated left upper lobe mass appears grossly stable to the prior examination when measured in a similar fashion on the prior study. No definite signs of extra skeletal metastatic disease noted in  the chest, abdomen or pelvis. 2. Widespread skeletal metastases redemonstrated, as above. 3. Hepatic steatosis. 4. Aortic atherosclerosis, in addition to left main and three-vessel coronary artery disease. Assessment for potential risk factor modification, dietary therapy or pharmacologic therapy may be warranted, if clinically indicated. 5. Additional incidental findings,    04/08/2022 Imaging   MRI lumbar spine w wo contrast  1. Stable remote superior  endplate fracture of L2 and large Schmorl to noted involving L5. No worrisome bone lesions to suggest metastatic disease. 2. Degenerative lumbar spondylosis with multilevel disc disease and facet disease which appears relatively stable as detailed above. 3. Enlarging right-sided synovial cyst at L4-5 with progressive mass effect on the right side of the thecal sac. This could potentially be symptomatic   04/28/2022 Imaging   CT chest abdomen pelvis wo contrast  1. Similar versus slightly decreased size of treated left upper lobe primary bronchogenic carcinoma. 2. Similar osseous metastasis. 3. No evidence of extraosseous metastatic disease. 4. New small right pleural effusion. 5. Coronary artery atherosclerosis. Aortic Atherosclerosis   07/03/2022 Imaging   PET scan showed 1. Interval development of a small focus of intense hypermetabolism within the left upper lobe scar. This is associated with a 5 mm perifissural nodule in the left lung which is hypermetabolic. Hypermetabolism at both of these locations is new since prior PET-CT and highly suspicious for recurrent disease. 2. No evidence for hypermetabolic soft tissue metastatic disease in the neck, abdomen, or pelvis. 3. Similar appearance of sclerotic and lytic bone lesions without hypermetabolism on PET imaging. 4. Focal hypermetabolism associated with a fracture of the right L4 transverse process. The fracture was visible on the previous CT of 04/28/2022. No associated soft tissue lesion to suggest that this is pathologic in nature. Tracer accumulation on today's study is in keeping with healing fracture. There is also some minimal uptake in a prominent spur associated with the L4 superior endplate, most likely degenerative. 5.  Aortic Atherosclerosis    08/01/2022 - 08/14/2022 Radiation Therapy   Radiation to left lung.    10/01/2022 Imaging   chest angiogram PE protocol  No pulmonary embolism identified.   Stable nodular opacity in the  left upper lobe. Please correlate for history of treated lung cancer. There is an adjacent small nodule along the course of the interlobar fissure which is slightly larger today compared to the study of February 2024. please correlate with findings of the recent PET-CT of 07/03/2022.      10/02/2022 Imaging   MRI brain with and without contrast 1. No acute intracranial abnormality. No evidence for intraparenchymal metastatic disease. 2. Chronic left cerebellar hemorrhage with multiple additional chronic micro hemorrhages elsewhere throughout the brain, stable. 3. Underlying mild chronic microvascular ischemic disease.    10/02/2022 Imaging   MRI lumbar spine showed 1. No MRI evidence for acute infection or other abnormality within the lumbar spine. 2. Few subcentimeter T1 hypointense lesions about the visualized posterior iliac wings. These correspond with small sclerotic foci seen on most recent CT from 04/08/2022, presumably reflecting patient's known osseous metastatic disease. These were not hypermetabolic on prior PET-CT from 16/12/9602. 3. No other evidence for metastatic disease within the lumbar spine itself. 4. Chronic L2 compression fracture, stable. Large Schmorl's node deformity with associated height loss at L5, also stable. 5. Multifactorial degenerative changes at L3-4 through L5-S1 with resultant mild to moderate bilateral subarticular stenosis, with mild to moderate bilateral L3 through L5 foraminal narrowing as above.   12/24/2022 Imaging   CT  chest w contrast   1. Stable appearance of treated lung lesion within the left upper lobe with surrounding scarring and architectural distortion. 2. The previous FDG avid subpleural nodule abutting the oblique fissure of the left lung (adjacent to post treatment changes) is again noted measuring 7 mm. This is stable from 10/01/2022. On the PET-CT from 07/03/2022 this nodule was measured at 5 mm. 3. New bandlike area of  subpleural consolidation within the lateral and posterior left lower lobe is identified. This is favored to represent post treatment change. 4. Stable sclerotic lesions involving the T12 and L1 vertebra. 5. Coronary artery calcifications. 6.  Aortic Atherosclerosis    03/06/2023 Imaging   CT chest abdomen pelvis wo contrast showed  Increasing opacity along the lingula with the increasing nodular central component compared to the recent CT scan.   There is also some changing opacities in the left lower lobe. The larger more peripheral area in the left lower lobe is decreasing with some new areas of subtle ground-glass more caudal. Attention on follow-up.   No developing new other mass lesion or nodal enlargement.   Scattered sclerotic bone lesions has a similar distribution to previous.   Overall evaluation for solid organ pathology including solid organ metastases are limited without the advantage of IV contrast.     03/23/2023 Imaging   PET scan showed 1. Post radiation consolidation in the lingula with mild residual focal hypermetabolism, overall improved from 07/03/2022. No evidence of disease progression. 2. Quiescent osseous metastatic disease. 3. Aortic atherosclerosis (ICD10-I70.0). Coronary artery calcification.     ALLERGIES:  is allergic to niaspan  [niacin ].  MEDICATIONS:  Current Outpatient Medications  Medication Sig Dispense Refill   meloxicam (MOBIC) 7.5 MG tablet Take 1 tablet (7.5 mg total) by mouth daily. 30 tablet 3   ALPRAZolam  (XANAX ) 0.5 MG tablet Take 1 tablet (0.5 mg total) by mouth at bedtime as needed for anxiety. 15 tablet 0   citalopram  (CELEXA ) 10 MG tablet Take 1 tablet (10 mg total) by mouth daily. 30 tablet 3   docusate sodium  (COLACE) 100 MG capsule Take 100 mg by mouth 2 (two) times daily as needed for mild constipation.     fentaNYL  (DURAGESIC ) 75 MCG/HR Place 1 patch onto the skin every 3 (three) days. 10 patch 0   gabapentin  (NEURONTIN )  300 MG capsule Take 2 capsules (600 mg total) by mouth 2 (two) times daily. 120 capsule 2   insulin  glargine (LANTUS ) 100 UNIT/ML injection Inject 0-30 Units into the skin daily as needed (High Blood Sugar over 150).     Insulin  Pen Needle (B-D UF III MINI PEN NEEDLES) 31G X 5 MM MISC Use to inject insulin  daily. 100 each 3   levETIRAcetam  (KEPPRA ) 500 MG tablet Take 1 tablet (500 mg total) by mouth 2 (two) times daily. 180 tablet 3   megestrol  (MEGACE ) 40 MG tablet Take 1 tablet (40 mg total) by mouth daily. 30 tablet 3   methocarbamol  (ROBAXIN ) 500 MG tablet Take 1 tablet (500 mg total) by mouth every 6 (six) hours as needed for muscle spasms. 120 tablet 0   NOVOLOG  FLEXPEN 100 UNIT/ML FlexPen Inject 0-10 Units into the skin daily as needed for high blood sugar (BG > 200).     Nystatin  (GERHARDT'S BUTT CREAM) CREA Apply 14 Applications topically 2 (two) times daily. 1 each 0   omeprazole  (PRILOSEC) 20 MG capsule Take 20 mg by mouth daily.     oxyCODONE -acetaminophen  (PERCOCET) 7.5-325 MG tablet Take 1 tablet  by mouth every 4 (four) hours as needed for severe pain (pain score 7-10). Do not take with cough medication or other pain medication 60 tablet 0   senna (SENOKOT) 8.6 MG TABS tablet Take 1 tablet (8.6 mg total) by mouth 2 (two) times daily as needed for mild constipation. 30 tablet 0   tamsulosin  (FLOMAX ) 0.4 MG CAPS capsule TAKE 1 CAPSULE(0.4 MG) BY MOUTH DAILY 30 capsule 2   No current facility-administered medications for this visit.   Facility-Administered Medications Ordered in Other Visits  Medication Dose Route Frequency Provider Last Rate Last Admin   heparin  lock flush 100 UNIT/ML injection            sodium chloride  flush (NS) 0.9 % injection 10 mL  10 mL Intracatheter PRN [name], MD   10 mL at 07/07/23 1434    VITAL SIGNS: There were no vitals taken for this visit. There were no vitals filed for this visit.   Estimated body mass index is 22.05 kg/m as calculated from the  following:   Height as of an earlier encounter on 07/29/23: 5\' 7"  (1.702 m).   Weight as of an earlier encounter on 07/29/23: 140 lb 12.8 oz (63.9 kg).  LABS: CBC:    Component Value Date/Time   WBC 8.2 07/29/2023 1322   HGB 11.9 (L) 07/29/2023 1322   HGB 13.4 07/17/2021 1345   HCT 37.8 (L) 07/29/2023 1322   PLT 325 07/29/2023 1322   PLT 373 07/17/2021 1345   MCV 90.2 07/29/2023 1322   NEUTROABS 5.4 07/29/2023 1322   LYMPHSABS 1.2 07/29/2023 1322   MONOABS 0.7 07/29/2023 1322   EOSABS 0.8 (H) 07/29/2023 1322   BASOSABS 0.1 07/29/2023 1322   Comprehensive Metabolic Panel:    Component Value Date/Time   NA 134 (L) 07/29/2023 1322   K 4.2 07/29/2023 1322   CL 104 07/29/2023 1322   CO2 24 07/29/2023 1322   BUN 23 07/29/2023 1322   CREATININE 0.78 07/29/2023 1322   CREATININE 1.11 10/09/2022 1500   GLUCOSE 110 (H) 07/29/2023 1322   CALCIUM  7.9 (L) 07/29/2023 1322   AST 19 07/29/2023 1322   AST 18 07/17/2021 1345   ALT 10 07/29/2023 1322   ALT 20 07/17/2021 1345   ALKPHOS 64 07/29/2023 1322   BILITOT 0.7 07/29/2023 1322   BILITOT 0.6 07/17/2021 1345   PROT 6.1 (L) 07/29/2023 1322   ALBUMIN 2.9 (L) 07/29/2023 1322    RADIOGRAPHIC STUDIES: DG PAIN CLINIC C-ARM 1-60 MIN NO REPORT Result Date: 07/20/2023 Fluoro was used, but no Radiologist interpretation will be provided. Please refer to "NOTES" tab for provider progress note.  DG PAIN CLINIC C-ARM 1-60 MIN NO REPORT Result Date: 07/08/2023 Fluoro was used, but no Radiologist interpretation will be provided. Please refer to "NOTES" tab for provider progress note.  PERFORMANCE STATUS (ECOG) : 2 - Symptomatic, <50% confined to bed  Review of Systems Unless otherwise noted, a complete review of systems is negative.  Physical Exam General: NAD Pulmonary: Unlabored Extremities: no edema, no joint deformities Skin: no rashes Neurological: Weakness but otherwise nonfocal  IMPRESSION: Patient seen in infusion.  Patient  continues to endorse significant/persistent and poorly controlled generalized pain.  Primarily in low back and multiple joints.  He is followed by interventional pain management with recent implantation of a nerve stimulator.  No significant improvement yet.  Patient is on transdermal fentanyl  and feels like that helps some.  At times, wife forgets to change the patch with subsequent worsening  of pain.  However, patient is not taking Percocet regularly.  He had concerns about possible constipation, which we discussed in detail.  Recommended that he utilize the Percocet as needed for breakthrough pain.  Patient is also taking Aleve  which he finds also helps.  Discussed rotating from Aleve  to scheduled meloxicam.  Patient has significant weakness.  He is working with home health physical therapy but is mostly bed/chair bound.  He would benefit from use of a lift to help with transfers.  PLAN: -Continue current scope of treatment -Continue fentanyl /Percocet -Start meloxicam 7.5 mg daily -Lift chair -Follow-up telephone visit 2 to 3 weeks  Patient expressed understanding and was in agreement with this plan. He also understands that He can call the clinic at any time with any questions, concerns, or complaints.   Time Total: 20 minutes  Visit consisted of counseling and education dealing with the complex and emotionally intense issues of symptom management and palliative care in the setting of serious and potentially life-threatening illness.Greater than 50%  of this time was spent counseling and coordinating care related to the above assessment and plan.  Signed by: Gerilyn Kobus, [name], NP-C

## 2023-07-29 NOTE — Assessment & Plan Note (Signed)
 Immunotherapy plan as listed above

## 2023-07-29 NOTE — Assessment & Plan Note (Signed)
 On Keppra.  Recommend patient to follow up with neurologist.

## 2023-07-29 NOTE — Discharge Instructions (Addendum)
 Increase water intake.  Do not take the lactulose if you are having normal bowel movements.  Once you are having regular bowel movements, take a stool softener daily.   If symptoms change or worsen, see your doctor or return to the ER.

## 2023-07-29 NOTE — Assessment & Plan Note (Signed)
 Fingertip neuropathy intermittent Grade 1, feet neuropathy persistent, Grade 2.  On Gabapentin 300mg  TID

## 2023-07-29 NOTE — Progress Notes (Signed)
 Hematology/Oncology Progress note [phone] [phone]) [phone]       CHIEF COMPLAINTS/REASON FOR VISIT:  Metastatic non-small cell lung cancer  ASSESSMENT & PLAN:   Cancer Staging  Primary non-small cell carcinoma of upper lobe of left lung (HCC) Staging form: Lung, AJCC 8th Edition - Clinical: Stage IV (cT2b, cN1, cM1) - Signed by [name], MD on 08/06/2021   Primary non-small cell carcinoma of upper lobe of left lung (HCC) Stage IV lung adenocarcinoma with brain and bone metastasis.  S/p lung radiation to left lung.  January 2025, CT and PET scan results were reviewed. Stable disease. No progression or new disease.  Labs are reviewed and discussed with patient. I recommend to continue Keytruda   Radiation oncology has ordered his next CT scan scheduled in July 2025.  He has poor PS and will not be able to tolerate other aggressive treatment. Shared decision was made not to order another CT prior to July.   Back pain Back pain, MRI lumbar showed DJD, L4-L5 synovial cyst,PET scan showed L4 fracture Continue PRN percocet 7.5mg /325mg , fentanyl  patch 75mcg Q72 hours Follow up with palliative care     Encounter for antineoplastic immunotherapy Immunotherapy plan as listed above  Metastasis to bone [facility]) Previously on Zometa  every 4- 6 weeks.Did not tolerate Zometa . Continue calcium  supplementation Recommend Xgeva  .  Neuropathy Fingertip neuropathy intermittent Grade 1, feet neuropathy persistent, Grade 2.  On Gabapentin  300mg  TID     Seizure (HCC) On Keppra .  Recommend patient to follow up with neurologist.    Follow up 3 weeks All questions were answered. The patient knows to call the clinic with any problems, questions or concerns.  [name], MD, [name] [facility] Hematology Oncology 07/29/2023      HISTORY OF PRESENTING ILLNESS:   Micheal Donovan is a  73 y.o.  male presents for follow up of Non-small cell lung cancer.  Oncology History Overview  Note  Diagnosis: Stage IIB T2b N1 M0 adenocarcinoma of the LUL, poorly differentiated     Primary non-small cell carcinoma of upper lobe of left lung (HCC)  06/13/2021 Initial Diagnosis   Primary non-small cell carcinoma of upper lobe of left lung [facility]) -06/20/2021 - 06/27/2021, patient presented to [facility] due to progressive headache/dizziness/gait changes. CT head showed acute to subacute intraparenchymal hemorrhage involving the left cerebellum.  Surrounding low-density vasogenic edema.  Patient was transferred to [facility]. This was further evaluated by CT angiogram of the neck which showed bulky calcified plaques/stenosis of carotid artery, Followed by MRI brain. 06/12/2021, MRI of the brain showed no significant interval change in size of the left cerebellar intraparenchymal hematoma with unchanged regional mass effect and partial effacement of fourth ventricle but no upstream hydrocephalus. There is no discernible enhancement to suggest underlying mass lesion, though acute blood products could mask enhancement.   06/12/2021 a chest x-ray showed a 4.4 cm left middle lobe. 06/13/2021, CT chest with contrast showed a 4.3 x 3.6 x 3.3 cm lobular spiculated mass in the posterior left upper lobe with T3 to the lateral pleura and major fissure.  Metastatic left hilar lymphadenopathy.  Peripheral micronodularity posterior right costophrenic sulcus.  Aortic atherosclerosis. 06/14/2021, CT abdomen pelvis showed right lower lobe pulmonary artery embolus.  No evidence of right heart strain.  No acute intra-abdominal or pelvic pathology.  Aortic atherosclerosis.  06/13/2021, patient underwent bronchoscopy with EBUS by Dr. Felipe Horton.  Biopsy from the fine-needle aspiration of station 11 mL showed malignant cells, consistent with poorly differentiated  non-small cell carcinoma, consistent with adenocarcinoma.  Malignant cells are TTF-1 positive and negative for p40.  Negative for neuroendocrine  markers.  Blood test and tissue sample were tested for Gardant 360  -PD-L1 TPS 97%, no actionable mutation on the blood testing. 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   07/17/2021 Cancer Staging   Staging form: Lung, AJCC 8th Edition - Clinical: Stage IV (cT2b, cN1, cM1) - Signed by [name], MD on 08/06/2021   08/06/2021 Imaging   I saw patient's PET scan after his visit with me on 08/06/21. PET scan was ordered by his previous oncologist group and did not come to my in basket.. Patient received cycle 1 carboplatin  Taxol  today.  He has started on radiation.  5/26/3 PET scan showed 3.8 cm hypermetabolic left upper lobe mass with hypermetabolic left hilar and infrahilar adenopathy.  There is approximately 8 scattered metastatic lesions in the skeleton. Mixed density photopenic lesion in the left cerebellum. characterized as hemorrhage on recent prior imaging workups.    08/16/2021 -  Chemotherapy   Patient is on Treatment Plan : LUNG Carboplatin  (5) + Pemetrexed  + Pembrolizumab  (200) D1 q21d Induction x 4 cycles / Maintenance Pemetrexed  + Pembrolizumab  (200) D1 q21d      08/22/2021 Imaging   MRI brain w wo contrast  Decrease in size of left cerebellar hematoma with resolution of edema. No evidence of underlying lesion. Smaller, more recent hemorrhage in the left cerebellar vermis with minimal edema. There is minimal enhancement without definite evidence of underlying lesion.  New punctate focus of chronic blood products and enhancement in the left frontal lobe. Additional new foci of chronic blood products in the posterior right putamen, right parietal subcortical white matter, and posterior right cerebellum. Unclear at this time if these represent foci of bland hemorrhage or early metastases.   Increase in size of right parietal osseous metastasis with minor extraosseous extension.     Imaging   PET scan showed 1. Interval response to therapy as evidenced by a small residual  left upper lobe nodule with decreased hypermetabolism, no residual hypermetabolic adenopathy and decreased hypermetabolism associatedwith osseous metastases. 2. 6 mm posterior left upper lobe nodule, likely stable. Recommend attention on follow-up. 3. Aortic atherosclerosis (ICD10-I70.0). Coronary artery calcification.   12/17/2021 Imaging   MRI lumbar spine w wo contrast  1. No evidence of regional metastatic disease. 2. Late subacute superior endplate fracture at L2 and large superior endplate Schmorl's node at L5, unchanged since the PET scan of 10/29/2021. 3. L3-4: Shallow disc protrusion. Facet and ligamentous hypertrophy. Stenosis of both lateral recesses. Findings slightly worsened since 2022. 4. L4-5: Shallow disc protrusion. Facet and ligamentous hypertrophy. Small synovial cyst arising from the facet joint on the right. Stenosis of the lateral recesses right worse than left. Foraminal narrowing right worse than left. Findings have worsened since 2022.  5. L5-S1: Endplate osteophytes and shallow protrusion of the disc. Facet and ligamentous hypertrophy. Stenosis of the subarticular lateral recesses and neural foramina, right worse than left. Similar appearance to the study of 2022. 6. Continued evidence of enteritis of at least 1 loop of small bowel. Distended bladder present   12/18/2021 Imaging   Brian MRI w wo  1. Interval decrease in size of the left cerebellar and vermian hematomas. No definite evidence of underlying metastatic lesion. 2. There is are two possible contrast enhancing lesions in the posterior left frontal lobe and left occipital lobe, which do not have intrinsic T1 signal abnormality, but have a  somewhat linear appearance and may be vascular in nature. Recommend attention on follow up. 3. Multifocal sites of susceptibility artifact are redemonstrated with interval development of a few new foci, as described above. All of these sites demonstrate intrinsic T1 hyperintense  signal abnormality and susceptibility artifact and are compatible with sites of microhemorrhages. Recommend continued attention on follow up. 4. Interval decrease in size of right parietal calvarial metastatic lesion. No new contrast enhancing lesions visualized. 5. Diffusely heterogeneous marrow signal throughout the cervical spine, which is nonspecific but can be seen in the setting of anemia, smoking, obesity, or a marrow replacement process.     12/18/2021 Imaging   MRI lumbar spine w wo contrast  1. No evidence of regional metastatic disease. 2. Late subacute superior endplate fracture at L2 and large superior endplate Schmorl's node at L5, unchanged since the PET scan of 10/29/2021 3. L3-4: Shallow disc protrusion. Facet and ligamentous hypertrophy.Stenosis of both lateral recesses. Findings slightly worsened since 2022. 4. L4-5: Shallow disc protrusion. Facet and ligamentous hypertrophy. Small synovial cyst arising from the facet joint on the right. Stenosis of the lateral recesses right worse than left. Foraminal narrowing right worse than left. Findings have worsened since 2022. 5. L5-S1: Endplate osteophytes and shallow protrusion of the disc. Facet and ligamentous hypertrophy. Stenosis of the subarticular lateral recesses and neural foramina, right worse than left. Similar appearance to the study of 2022. 6. Continued evidence of enteritis of at least 1 loop of small bowel. Distended bladder present.       01/28/2022 Imaging   CT chest abdomen pelvis w contrast 1. Treated left upper lobe mass appears grossly stable to the prior examination when measured in a similar fashion on the prior study. No definite signs of extra skeletal metastatic disease noted in the chest, abdomen or pelvis. 2. Widespread skeletal metastases redemonstrated, as above. 3. Hepatic steatosis. 4. Aortic atherosclerosis, in addition to left main and three-vessel coronary artery disease. Assessment for  potential risk factor modification, dietary therapy or pharmacologic therapy may be warranted, if clinically indicated. 5. Additional incidental findings,    04/08/2022 Imaging   MRI lumbar spine w wo contrast  1. Stable remote superior endplate fracture of L2 and large Schmorl to noted involving L5. No worrisome bone lesions to suggest metastatic disease. 2. Degenerative lumbar spondylosis with multilevel disc disease and facet disease which appears relatively stable as detailed above. 3. Enlarging right-sided synovial cyst at L4-5 with progressive mass effect on the right side of the thecal sac. This could potentially be symptomatic   04/28/2022 Imaging   CT chest abdomen pelvis wo contrast  1. Similar versus slightly decreased size of treated left upper lobe primary bronchogenic carcinoma. 2. Similar osseous metastasis. 3. No evidence of extraosseous metastatic disease. 4. New small right pleural effusion. 5. Coronary artery atherosclerosis. Aortic Atherosclerosis   07/03/2022 Imaging   PET scan showed 1. Interval development of a small focus of intense hypermetabolism within the left upper lobe scar. This is associated with a 5 mm perifissural nodule in the left lung which is hypermetabolic. Hypermetabolism at both of these locations is new since prior PET-CT and highly suspicious for recurrent disease. 2. No evidence for hypermetabolic soft tissue metastatic disease in the neck, abdomen, or pelvis. 3. Similar appearance of sclerotic and lytic bone lesions without hypermetabolism on PET imaging. 4. Focal hypermetabolism associated with a fracture of the right L4 transverse process. The fracture was visible on the previous CT of 04/28/2022. No associated soft tissue  lesion to suggest that this is pathologic in nature. Tracer accumulation on today's study is in keeping with healing fracture. There is also some minimal uptake in a prominent spur associated with the L4 superior endplate, most  likely degenerative. 5.  Aortic Atherosclerosis    08/01/2022 - 08/14/2022 Radiation Therapy   Radiation to left lung.    10/01/2022 Imaging   chest angiogram PE protocol  No pulmonary embolism identified.   Stable nodular opacity in the left upper lobe. Please correlate for history of treated lung cancer. There is an adjacent small nodule along the course of the interlobar fissure which is slightly larger today compared to the study of February 2024. please correlate with findings of the recent PET-CT of 07/03/2022.      10/02/2022 Imaging   MRI brain with and without contrast 1. No acute intracranial abnormality. No evidence for intraparenchymal metastatic disease. 2. Chronic left cerebellar hemorrhage with multiple additional chronic micro hemorrhages elsewhere throughout the brain, stable. 3. Underlying mild chronic microvascular ischemic disease.    10/02/2022 Imaging   MRI lumbar spine showed 1. No MRI evidence for acute infection or other abnormality within the lumbar spine. 2. Few subcentimeter T1 hypointense lesions about the visualized posterior iliac wings. These correspond with small sclerotic foci seen on most recent CT from 04/08/2022, presumably reflecting patient's known osseous metastatic disease. These were not hypermetabolic on prior PET-CT from 69/62/9528. 3. No other evidence for metastatic disease within the lumbar spine itself. 4. Chronic L2 compression fracture, stable. Large Schmorl's node deformity with associated height loss at L5, also stable. 5. Multifactorial degenerative changes at L3-4 through L5-S1 with resultant mild to moderate bilateral subarticular stenosis, with mild to moderate bilateral L3 through L5 foraminal narrowing as above.   12/24/2022 Imaging   CT chest w contrast   1. Stable appearance of treated lung lesion within the left upper lobe with surrounding scarring and architectural distortion. 2. The previous FDG avid subpleural  nodule abutting the oblique fissure of the left lung (adjacent to post treatment changes) is again noted measuring 7 mm. This is stable from 10/01/2022. On the PET-CT from 07/03/2022 this nodule was measured at 5 mm. 3. New bandlike area of subpleural consolidation within the lateral and posterior left lower lobe is identified. This is favored to represent post treatment change. 4. Stable sclerotic lesions involving the T12 and L1 vertebra. 5. Coronary artery calcifications. 6.  Aortic Atherosclerosis    03/06/2023 Imaging   CT chest abdomen pelvis wo contrast showed  Increasing opacity along the lingula with the increasing nodular central component compared to the recent CT scan.   There is also some changing opacities in the left lower lobe. The larger more peripheral area in the left lower lobe is decreasing with some new areas of subtle ground-glass more caudal. Attention on follow-up.   No developing new other mass lesion or nodal enlargement.   Scattered sclerotic bone lesions has a similar distribution to previous.   Overall evaluation for solid organ pathology including solid organ metastases are limited without the advantage of IV contrast.     03/23/2023 Imaging   PET scan showed 1. Post radiation consolidation in the lingula with mild residual focal hypermetabolism, overall improved from 07/03/2022. No evidence of disease progression. 2. Quiescent osseous metastatic disease. 3. Aortic atherosclerosis (ICD10-I70.0). Coronary artery calcification.   # Patient has a history of melanoma on his neck, treated in 2009. # History of hemorrhagic infarct left cerebellum  10/01/2022 - 10/06/2022 patient  was admitted to the hospital due to lower extremity weakness and intermittent aphasia. Patient reports acute on chronic left lower extremity weakness, weakness has been more prominent over the past 2 weeks.  He reports significant episode of difficulty with ambulation 2 days after  his last Keytruda  treatments.  He reports that he felt loss of control of muscle at that time.  Patient reports that the weakness is different than her chronic lower extremity  weakness.  Patient was found to have orthostatic hypotension Flomax  irbesartan  were held.  CK wnl, Neurology was consulted and started the patient on Keppra  for possible seizure.  EEG was negative.  01/31/23 He recently presented to ER due to altered mental status and shaking episodes.  CT head wo negative. UA positive for nitrate and leukocyte esterase. Code sepsis was actived in ER and he was recommended for treatment of UTI.  He declined admission and went home with Keflex  500mg  4 times daily for 10 days.  Lower extremity weakness localizes best to lumbosacral nerve roots, known spondylosis with multiple foci of neuro-foraminal stenosis.  02/09/2023 S/p decompression neurosurgery   04/17/2023 - 04/21/2023 hospitalized due to seizure, and UTI he was loaded with Keppra . Seen by neurology in office. UTI was treated with IV rocephin , discharged on Cipro .  INTERVAL HISTORY BOWE SIDOR is a 73 y.o. male who has above history reviewed by me today presents for follow up visit for management of  Stage IV lung adenocarcinoma Accompanied by care give.   + back pain, and shoulder and knee pain, on fentanyl  patch 75mcg Q72 hours, rarely takes percocet Today he reports feeling tired. Chronic joint pain. .    Review of Systems  Constitutional:  Positive for fatigue. Negative for appetite change, chills, fever and unexpected weight change.  HENT:   Negative for hearing loss and voice change.   Eyes:  Negative for eye problems and icterus.  Respiratory:  Positive for shortness of breath. Negative for chest tightness and cough.   Cardiovascular:  Positive for leg swelling. Negative for chest pain.  Gastrointestinal:  Negative for abdominal distention, abdominal pain and blood in stool.  Endocrine: Negative for hot flashes.   Genitourinary:  Negative for difficulty urinating, dysuria and frequency.   Musculoskeletal:  Positive for arthralgias and back pain.       Chronic lower extremity weakness  Skin:  Negative for itching and rash.  Neurological:  Negative for extremity weakness, headaches, light-headedness and numbness.  Hematological:  Negative for adenopathy. Does not bruise/bleed easily.  Psychiatric/Behavioral:  Negative for confusion.     MEDICAL HISTORY:  Past Medical History:  Diagnosis Date   Allergy    Anxiety    BPH (benign prostatic hypertrophy)    Cancer (HCC)    Melanoma on Neck    2008   Carotid artery occlusion    CHF (congestive heart failure) (HCC)    Diabetes mellitus    type 2   ED (erectile dysfunction)    GERD (gastroesophageal reflux disease)    Hemorrhagic stroke (HCC) 06/2021   Hyperlipidemia    Hypertension    Lung cancer (HCC)    Neoplasm related pain    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)    Stroke [facility])     SURGICAL HISTORY: Past Surgical History:  Procedure Laterality Date   ANTERIOR CERVICAL DECOMP/DISCECTOMY FUSION N/A 02/09/2023   Procedure: C3-5 ANTERIOR CERVICAL DISCECTOMY AND FUSION;  Surgeon: Jodeen Munch, MD;  Location: [facility] ORS;  Service: Neurosurgery;  Laterality: N/A;  BRONCHIAL NEEDLE ASPIRATION BIOPSY  06/17/2021   Procedure: BRONCHIAL NEEDLE ASPIRATION BIOPSIES;  Surgeon: Josiah Nigh, MD;  Location[facility] ENDOSCOPY;  Service: Pulmonary;;   IR IMAGING GUIDED PORT INSERTION  08/05/2021   MELANOMA EXCISION  2008   Left side of neck   RADIOLOGY WITH ANESTHESIA N/A 04/05/2020   Procedure: MRI SPINE WITOUT CONTRAST;  Surgeon: Radiologist, Medication, MD;  Location: [facility] OR;  Service: Radiology;  Laterality: N/A;   RADIOLOGY WITH ANESTHESIA N/A 08/22/2021   Procedure: MRI BRAIN WITH AND WITHOUT CONTRAST  WITH ANESTHESIA;  Surgeon: Radiologist, Medication, MD;  Location: [facility] OR;  Service: Radiology;  Laterality: N/A;   RADIOLOGY WITH ANESTHESIA N/A  12/17/2021   Procedure: MRI BRAIN WITH AND WITHOUT CONTRAST WITH ANESTHESIA; MRI LUMBER WITH AND WITHOUT CONTRAST;  Surgeon: Radiologist, Medication, MD;  Location: [facility] OR;  Service: Radiology;  Laterality: N/A;   RADIOLOGY WITH ANESTHESIA N/A 04/08/2022   Procedure: MRI LUMBER SPINE WITH AND WITHOUT CONTRAST;  Surgeon: Radiologist, Medication, MD;  Location: [facility] OR;  Service: Radiology;  Laterality: N/A;   TONSILLECTOMY     VIDEO BRONCHOSCOPY WITH ENDOBRONCHIAL ULTRASOUND N/A 06/17/2021   Procedure: VIDEO BRONCHOSCOPY WITH ENDOBRONCHIAL ULTRASOUND;  Surgeon: Josiah Nigh, MD;  Location: [facility] ENDOSCOPY;  Service: Pulmonary;  Laterality: N/A;    SOCIAL HISTORY: Social History   Socioeconomic History   Marital status: Married    Spouse name: Not on file   Number of children: Not on file   Years of education: Not on file   Highest education level: Associate degree: occupational, Scientist, product/process development, or vocational program  Occupational History   Not on file  Tobacco Use   Smoking status: Former    Current packs/day: 0.00    Types: Cigarettes    Quit date: 07/21/1990    Years since quitting: 33.0   Smokeless tobacco: Never  Vaping Use   Vaping status: Never Used  Substance and Sexual Activity   Alcohol use: No   Drug use: No   Sexual activity: Not Currently  Other Topics Concern   Not on file  Social History Narrative   Not on file   Social Drivers of Health   Financial Resource Strain: Low Risk  (05/27/2023)   Overall Financial Resource Strain (CARDIA)    Difficulty of Paying Living Expenses: Not very hard  Food Insecurity: No Food Insecurity (05/27/2023)   Hunger Vital Sign    Worried About Running Out of Food in the Last Year: Never true    Ran Out of Food in the Last Year: Never true  Transportation Needs: No Transportation Needs (05/27/2023)   PRAPARE - Administrator, Civil Service (Medical): No    Lack of Transportation (Non-Medical): No  Physical Activity: Unknown  (05/27/2023)   Exercise Vital Sign    Days of Exercise per Week: 0 days    Minutes of Exercise per Session: Not on file  Stress: Stress Concern Present (05/27/2023)   [employer] of Occupational Health - Occupational Stress Questionnaire    Feeling of Stress : To some extent  Social Connections: Unknown (05/27/2023)   Social Connection and Isolation Panel [NHANES]    Frequency of Communication with Friends and Family: Once a week    Frequency of Social Gatherings with Friends and Family: Patient declined    Attends Religious Services: Patient unable to answer    Active Member of Clubs or Organizations: No    Attends Banker Meetings: Patient unable to answer    Marital  Status: Married  Catering manager Violence: Not At Risk (04/22/2023)   Humiliation, Afraid, Rape, and Kick questionnaire    Fear of Current or Ex-Partner: No    Emotionally Abused: No    Physically Abused: No    Sexually Abused: No    FAMILY HISTORY: Family History  Problem Relation Age of Onset   COPD Mother    Clotting disorder Mother    Heart disease Father    Heart disease Brother        MI at age 68   Clotting disorder Maternal Grandmother    Stroke Neg Hx     ALLERGIES:  is allergic to niaspan  [niacin ].  MEDICATIONS:  Current Outpatient Medications  Medication Sig Dispense Refill   ALPRAZolam  (XANAX ) 0.5 MG tablet Take 1 tablet (0.5 mg total) by mouth at bedtime as needed for anxiety. 15 tablet 0   citalopram  (CELEXA ) 10 MG tablet Take 1 tablet (10 mg total) by mouth daily. 30 tablet 3   docusate sodium  (COLACE) 100 MG capsule Take 100 mg by mouth 2 (two) times daily as needed for mild constipation.     fentaNYL  (DURAGESIC ) 75 MCG/HR Place 1 patch onto the skin every 3 (three) days. 10 patch 0   gabapentin  (NEURONTIN ) 300 MG capsule Take 2 capsules (600 mg total) by mouth 2 (two) times daily. 120 capsule 2   insulin  glargine (LANTUS ) 100 UNIT/ML injection Inject 0-30 Units into the skin  daily as needed (High Blood Sugar over 150).     Insulin  Pen Needle (B-D UF III MINI PEN NEEDLES) 31G X 5 MM MISC Use to inject insulin  daily. 100 each 3   levETIRAcetam  (KEPPRA ) 500 MG tablet Take 1 tablet (500 mg total) by mouth 2 (two) times daily. 180 tablet 3   megestrol  (MEGACE ) 40 MG tablet Take 1 tablet (40 mg total) by mouth daily. 30 tablet 3   methocarbamol  (ROBAXIN ) 500 MG tablet Take 1 tablet (500 mg total) by mouth every 6 (six) hours as needed for muscle spasms. 120 tablet 0   NOVOLOG  FLEXPEN 100 UNIT/ML FlexPen Inject 0-10 Units into the skin daily as needed for high blood sugar (BG > 200).     Nystatin  (GERHARDT'S BUTT CREAM) CREA Apply 14 Applications topically 2 (two) times daily. 1 each 0   omeprazole  (PRILOSEC) 20 MG capsule Take 20 mg by mouth daily.     oxyCODONE -acetaminophen  (PERCOCET) 7.5-325 MG tablet Take 1 tablet by mouth every 4 (four) hours as needed for severe pain (pain score 7-10). Do not take with cough medication or other pain medication 60 tablet 0   senna (SENOKOT) 8.6 MG TABS tablet Take 1 tablet (8.6 mg total) by mouth 2 (two) times daily as needed for mild constipation. 30 tablet 0   tamsulosin  (FLOMAX ) 0.4 MG CAPS capsule TAKE 1 CAPSULE(0.4 MG) BY MOUTH DAILY 30 capsule 2   meloxicam (MOBIC) 7.5 MG tablet Take 1 tablet (7.5 mg total) by mouth daily. 30 tablet 3   No current facility-administered medications for this visit.   Facility-Administered Medications Ordered in Other Visits  Medication Dose Route Frequency Provider Last Rate Last Admin   heparin  lock flush 100 UNIT/ML injection            sodium chloride  flush (NS) 0.9 % injection 10 mL  10 mL Intracatheter PRN [name], MD   10 mL at 07/07/23 1434     PHYSICAL EXAMINATION:  Vitals:   07/29/23 1353  BP: 130/64  Pulse: 81  Resp:  18  Temp: (!) 96.8 F (36 C)  SpO2: 100%   Filed Weights   07/29/23 1353  Weight: 140 lb 12.8 oz (63.9 kg)    Physical Exam Constitutional:       General: He is not in acute distress.    Appearance: He is ill-appearing.  HENT:     Head: Normocephalic and atraumatic.  Eyes:     General: No scleral icterus. Cardiovascular:     Rate and Rhythm: Normal rate and regular rhythm.     Heart sounds: Normal heart sounds.  Pulmonary:     Effort: Pulmonary effort is normal. No respiratory distress.     Breath sounds: No wheezing.     Comments: Decreased breath sound bilaterally. Abdominal:     General: Bowel sounds are normal. There is no distension.     Palpations: Abdomen is soft.  Musculoskeletal:        General: No deformity. Normal range of motion.     Cervical back: Normal range of motion and neck supple.     Comments: Bilateral ankle trace edema.  Skin:    General: Skin is warm and dry.     Findings: No rash.  Neurological:     Mental Status: He is alert. Mental status is at baseline.     Cranial Nerves: No cranial nerve deficit.     Comments: Chronic left-sided weakness  Psychiatric:        Mood and Affect: Mood normal.     LABORATORY DATA:  I have reviewed the data as listed    Latest Ref Rng & Units 07/29/2023    1:22 PM 07/07/2023   12:37 PM 06/16/2023    1:28 PM  CBC  WBC 4.0 - 10.5 K/uL 8.2  7.4  8.6   Hemoglobin 13.0 - 17.0 g/dL 40.9  81.1  91.4   Hematocrit 39.0 - 52.0 % 37.8  37.9  39.7   Platelets 150 - 400 K/uL 325  270  264       Latest Ref Rng & Units 07/29/2023    1:22 PM 07/07/2023   12:37 PM 06/16/2023    1:28 PM  CMP  Glucose 70 - 99 mg/dL 782  956  213   BUN 8 - 23 mg/dL 23  18  16    Creatinine 0.61 - 1.24 mg/dL 0.86  5.78  4.69   Sodium 135 - 145 mmol/L 134  135  131   Potassium 3.5 - 5.1 mmol/L 4.2  3.8  3.9   Chloride 98 - 111 mmol/L 104  101  98   CO2 22 - 32 mmol/L 24  24  25    Calcium  8.9 - 10.3 mg/dL 7.9  8.3  8.5   Total Protein 6.5 - 8.1 g/dL 6.1  6.3  6.6   Total Bilirubin 0.0 - 1.2 mg/dL 0.7  0.5  0.7   Alkaline Phos 38 - 126 U/L 64  68  64   AST 15 - 41 U/L 19  18  17    ALT 0 - 44  U/L 10  9  9       Iron /TIBC/Ferritin/ %Sat    Component Value Date/Time   IRON  60 02/03/2022 0958   TIBC 391 02/03/2022 0958   FERRITIN 225 04/19/2023 0648   IRONPCTSAT 15 (L) 02/03/2022 0958      RADIOGRAPHIC STUDIES: I have personally reviewed the radiological images as listed and agreed with the findings in the report. DG PAIN CLINIC C-ARM 1-60 MIN NO REPORT Result  Date: 07/20/2023 Fluoro was used, but no Radiologist interpretation will be provided. Please refer to "NOTES" tab for provider progress note.  DG PAIN CLINIC C-ARM 1-60 MIN NO REPORT Result Date: 07/08/2023 Fluoro was used, but no Radiologist interpretation will be provided. Please refer to "NOTES" tab for provider progress note.
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
## 2023-07-29 NOTE — Progress Notes (Signed)
 Safety precautions to be maintained throughout the outpatient stay will include: orient to surroundings, keep bed in low position, maintain call bell within reach at all times, provide assistance with transfer out of bed and ambulation.   Patient states that he will give SCS a try, Siegfried Dress changed site and showed wife how to do. He will adjust the intensity.

## 2023-07-29 NOTE — Assessment & Plan Note (Signed)
 Previously on Zometa  every 4- 6 weeks.Did not tolerate Zometa . Continue calcium  supplementation Recommend Xgeva  .

## 2023-07-29 NOTE — ED Provider Notes (Signed)
 [facility] Provider Note    Event Date/Time   First MD Initiated Contact with Patient 07/29/23 2137     (approximate)   History   Constipation   HPI  Micheal Donovan is a 73 y.o. 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  Last bowel movement was approximately 6 days ago.  No nausea or vomiting.  Appetite has been less today but he is tolerating fluids.  He attempted 2 enemas at home today.  States has generalized cramping in the abdomen without focal area of tenderness.  He does feel the urge to have a bowel movement but has no results when he tries to go.    Physical Exam   Triage Vital Signs: ED Triage Vitals  Encounter Vitals Group     BP 07/29/23 2045 118/84     Systolic BP Percentile --      Diastolic BP Percentile --      Pulse Rate 07/29/23 2045 (!) 55     Resp 07/29/23 2045 20     Temp 07/29/23 2045 98.6 F (37 C)     Temp src --      SpO2 07/29/23 2045 99 %     Weight 07/29/23 2044 140 lb (63.5 kg)     Height 07/29/23 2044 5\' 7"  (1.702 m)     Head Circumference --      Peak Flow --      Pain Score --      Pain Loc --      Pain Education --      Exclude from Growth Chart --     Most recent vital signs: Vitals:   07/29/23 2045  BP: 118/84  Pulse: (!) 55  Resp: 20  Temp: 98.6 F (37 C)  SpO2: 99%    General: Awake, no distress.  CV:  Good peripheral perfusion.  Resp:  Normal effort.  Abd:  No distention.  Other:     ED Results / Procedures / Treatments   Labs (all labs ordered are listed, but only abnormal results are displayed) Labs Reviewed - No data to display   EKG  Not indicated.   RADIOLOGY  Image and radiology report reviewed and interpreted by me. Radiology report consistent with the same.  KUB shows  a nonobstructive bowel gas pattern. PROCEDURES:  Critical Care performed: No  Procedures   MEDICATIONS ORDERED IN ED:  Medications - No data to display   IMPRESSION / MDM / ASSESSMENT AND PLAN / ED COURSE   I have reviewed the triage note.  Differential diagnosis includes, but is not limited to, constipation, small bowel obstruction  Patient's presentation is most consistent with acute illness / injury with system symptoms.  73 year old male presenting to the emergency department for evaluation of constipation.  Last bowel movement was approximately 6 days ago.  Patient does have the urge to defecate however is unable to produce anything when he tries.  Wife attempted 2 enemas today, but he was unable to retain the fluid and therefore had no results.  Vital signs reviewed and are stable.  Outside record reviewed from today's office visit.  It is noted that he discussed his concern of constipation.  It does not appear that medications were specifically prescribed for treatment.  KUB tonight does not reveal an  obstructive bowel gas pattern and he does not have other symptoms that would be consistent with small bowel obstruction.  It is unlikely that enema would be helpful tonight since he has already attempted that twice at home today and was unable to retain the fluid.  Plan will be to have him take 2 Dulcolax tablets in the morning and lactulose  if the Dulcolax do not work.  He was encouraged to follow-up with his oncologist or primary care provider if symptoms are not relieved with this treatment.  If his symptoms change or worsen and he is unable to see primary care he is to return to the emergency department.       FINAL CLINICAL IMPRESSION(S) / ED DIAGNOSES   Final diagnoses:  Drug-induced constipation     Rx / DC Orders   ED Discharge Orders          Ordered    lactulose  (CHRONULAC ) 10 GM/15ML solution  3 times daily PRN        07/29/23 2309    bisacodyl (DULCOLAX)  5 MG EC tablet   Once        07/29/23 2309             Note:  This document was prepared using Dragon voice recognition software and may include unintentional dictation errors.   Sherryle Don, FNP 07/29/23 2330    Viviano Ground, MD 08/03/23 [phone]

## 2023-07-29 NOTE — Patient Instructions (Signed)
 [facility] - A DEPT OF [facility]  Discharge Instructions: Thank you for choosing [facility] to provide your oncology and hematology care.  If you have a lab appointment with the Cancer Center, please go directly to the Cancer Center and check in at the registration area.  Wear comfortable clothing and clothing appropriate for easy access to any Portacath or PICC line.   We strive to give you quality time with your provider. You may need to reschedule your appointment if you arrive late (15 or more minutes).  Arriving late affects you and other patients whose appointments are after yours.  Also, if you miss three or more appointments without notifying the office, you may be dismissed from the clinic at the provider's discretion.      For prescription refill requests, have your pharmacy contact our office and allow 72 hours for refills to be completed.    Today you received the following chemotherapy and/or immunotherapy agents Keytruda      To help prevent nausea and vomiting after your treatment, we encourage you to take your nausea medication as directed.  BELOW ARE SYMPTOMS THAT SHOULD BE REPORTED IMMEDIATELY: *FEVER GREATER THAN 100.4 F (38 C) OR HIGHER *CHILLS OR SWEATING *NAUSEA AND VOMITING THAT IS NOT CONTROLLED WITH YOUR NAUSEA MEDICATION *UNUSUAL SHORTNESS OF BREATH *UNUSUAL BRUISING OR BLEEDING *URINARY PROBLEMS (pain or burning when urinating, or frequent urination) *BOWEL PROBLEMS (unusual diarrhea, constipation, pain near the anus) TENDERNESS IN MOUTH AND THROAT WITH OR WITHOUT PRESENCE OF ULCERS (sore throat, sores in mouth, or a toothache) UNUSUAL RASH, SWELLING OR PAIN  UNUSUAL VAGINAL DISCHARGE OR ITCHING   Items with * indicate a potential emergency and should be followed up as soon as possible or go to the Emergency Department if any problems should occur.  Please show the CHEMOTHERAPY ALERT CARD or IMMUNOTHERAPY  ALERT CARD at check-in to the Emergency Department and triage nurse.  Should you have questions after your visit or need to cancel or reschedule your appointment, please contact [facility] - A DEPT OF [facility]  [phone] and follow the prompts.  Office hours are 8:00 a.m. to 4:30 p.m. Monday - Friday. Please note that voicemails left after 4:00 p.m. may not be returned until the following business day.  We are closed weekends and major holidays. You have access to a nurse at all times for urgent questions. Please call the main number to the clinic [phone] and follow the prompts.  For any non-urgent questions, you may also contact your provider using MyChart. We now offer e-Visits for anyone 58 and older to request care online for non-urgent symptoms. For details visit [email or web address].   Also download the MyChart app! Go to the app store, search "MyChart", open the app, select [place], and log in with your MyChart username and password.

## 2023-07-29 NOTE — ED Triage Notes (Signed)
 Pt has had constipation x6 days, pt has lung cx with mets to bone currently getting immunotherapy.

## 2023-07-30 ENCOUNTER — Other Ambulatory Visit: Payer: Self-pay | Admitting: *Deleted

## 2023-07-30 ENCOUNTER — Encounter: Payer: Self-pay | Admitting: Student in an Organized Health Care Education/Training Program

## 2023-07-30 ENCOUNTER — Telehealth: Payer: Self-pay

## 2023-07-30 DIAGNOSIS — I70235 Atherosclerosis of native arteries of right leg with ulceration of other part of foot: Secondary | ICD-10-CM

## 2023-08-03 ENCOUNTER — Ambulatory Visit: Admitting: Student in an Organized Health Care Education/Training Program

## 2023-08-04 ENCOUNTER — Ambulatory Visit: Admitting: Podiatry

## 2023-08-04 DIAGNOSIS — M79674 Pain in right toe(s): Secondary | ICD-10-CM | POA: Diagnosis not present

## 2023-08-04 DIAGNOSIS — B351 Tinea unguium: Secondary | ICD-10-CM | POA: Diagnosis not present

## 2023-08-04 DIAGNOSIS — M79675 Pain in left toe(s): Secondary | ICD-10-CM | POA: Diagnosis not present

## 2023-08-04 NOTE — Progress Notes (Signed)
  Subjective:  Patient ID: Micheal Donovan, male    DOB: 1950/04/17,  MRN: [id number]  Chief Complaint  Patient presents with   Nail Problem    Nail trim    73 y.o. male returns for the above complaint.  Patient presents with thickened onychodystrophy mycotic nail x 10 mild pain on palpation worse with ambulation shoe pressure patient would like for me debride out his I will do it himself denies any other acute complaints.  Objective:  There were no vitals filed for this visit. Podiatric Exam: Vascular: dorsalis pedis and posterior tibial pulses are palpable bilateral. Capillary return is immediate. Temperature gradient is WNL. Skin turgor WNL  Sensorium: Normal Semmes Weinstein monofilament test. Normal tactile sensation bilaterally. Nail Exam: Pt has thick disfigured discolored nails with subungual debris noted bilateral entire nail hallux through fifth toenails.  Pain on palpation to the nails. Ulcer Exam: There is no evidence of ulcer or pre-ulcerative changes or infection. Orthopedic Exam: Muscle tone and strength are WNL. No limitations in general ROM. No crepitus or effusions noted.  Skin: No Porokeratosis. No infection or ulcers    Assessment & Plan:   1. Pain due to onychomycosis of toenails of both feet     Patient was evaluated and treated and all questions answered.  Onychomycosis with pain  -Nails palliatively debrided as below. -Educated on self-care  Procedure: Nail Debridement Rationale: pain  Type of Debridement: manual, sharp debridement. Instrumentation: Nail nipper, rotary burr. Number of Nails: 10  Procedures and Treatment: Consent by patient was obtained for treatment procedures. The patient understood the discussion of treatment and procedures well. All questions were answered thoroughly reviewed. Debridement of mycotic and hypertrophic toenails, 1 through 5 bilateral and clearing of subungual debris. No ulceration, no infection noted.  Return Visit-Office  Procedure: Patient instructed to return to the office for a follow up visit 3 months for continued evaluation and treatment.  Tinnie Forehand, DPM    No follow-ups on file.

## 2023-08-06 ENCOUNTER — Ambulatory Visit: Admitting: Student in an Organized Health Care Education/Training Program

## 2023-08-13 ENCOUNTER — Inpatient Hospital Stay: Attending: Radiation Oncology | Admitting: Hospice and Palliative Medicine

## 2023-08-13 DIAGNOSIS — E11319 Type 2 diabetes mellitus with unspecified diabetic retinopathy without macular edema: Secondary | ICD-10-CM | POA: Insufficient documentation

## 2023-08-13 DIAGNOSIS — I2510 Atherosclerotic heart disease of native coronary artery without angina pectoris: Secondary | ICD-10-CM | POA: Insufficient documentation

## 2023-08-13 DIAGNOSIS — M549 Dorsalgia, unspecified: Secondary | ICD-10-CM | POA: Insufficient documentation

## 2023-08-13 DIAGNOSIS — Z87891 Personal history of nicotine dependence: Secondary | ICD-10-CM | POA: Insufficient documentation

## 2023-08-13 DIAGNOSIS — Z9221 Personal history of antineoplastic chemotherapy: Secondary | ICD-10-CM | POA: Insufficient documentation

## 2023-08-13 DIAGNOSIS — I509 Heart failure, unspecified: Secondary | ICD-10-CM | POA: Insufficient documentation

## 2023-08-13 DIAGNOSIS — Z515 Encounter for palliative care: Secondary | ICD-10-CM

## 2023-08-13 DIAGNOSIS — G893 Neoplasm related pain (acute) (chronic): Secondary | ICD-10-CM | POA: Insufficient documentation

## 2023-08-13 DIAGNOSIS — Z794 Long term (current) use of insulin: Secondary | ICD-10-CM | POA: Insufficient documentation

## 2023-08-13 DIAGNOSIS — E785 Hyperlipidemia, unspecified: Secondary | ICD-10-CM | POA: Insufficient documentation

## 2023-08-13 DIAGNOSIS — E1140 Type 2 diabetes mellitus with diabetic neuropathy, unspecified: Secondary | ICD-10-CM | POA: Insufficient documentation

## 2023-08-13 DIAGNOSIS — G40909 Epilepsy, unspecified, not intractable, without status epilepticus: Secondary | ICD-10-CM | POA: Insufficient documentation

## 2023-08-13 DIAGNOSIS — I700 Atherosclerosis of aorta: Secondary | ICD-10-CM | POA: Insufficient documentation

## 2023-08-13 DIAGNOSIS — M6281 Muscle weakness (generalized): Secondary | ICD-10-CM | POA: Insufficient documentation

## 2023-08-13 DIAGNOSIS — I110 Hypertensive heart disease with heart failure: Secondary | ICD-10-CM | POA: Insufficient documentation

## 2023-08-13 DIAGNOSIS — C3412 Malignant neoplasm of upper lobe, left bronchus or lung: Secondary | ICD-10-CM | POA: Insufficient documentation

## 2023-08-13 DIAGNOSIS — R531 Weakness: Secondary | ICD-10-CM | POA: Insufficient documentation

## 2023-08-13 DIAGNOSIS — C7931 Secondary malignant neoplasm of brain: Secondary | ICD-10-CM | POA: Insufficient documentation

## 2023-08-13 DIAGNOSIS — Z79899 Other long term (current) drug therapy: Secondary | ICD-10-CM | POA: Insufficient documentation

## 2023-08-13 DIAGNOSIS — Z8673 Personal history of transient ischemic attack (TIA), and cerebral infarction without residual deficits: Secondary | ICD-10-CM | POA: Insufficient documentation

## 2023-08-13 DIAGNOSIS — D649 Anemia, unspecified: Secondary | ICD-10-CM | POA: Insufficient documentation

## 2023-08-13 DIAGNOSIS — Z85820 Personal history of malignant melanoma of skin: Secondary | ICD-10-CM | POA: Insufficient documentation

## 2023-08-13 DIAGNOSIS — Z791 Long term (current) use of non-steroidal anti-inflammatories (NSAID): Secondary | ICD-10-CM | POA: Insufficient documentation

## 2023-08-13 DIAGNOSIS — Z923 Personal history of irradiation: Secondary | ICD-10-CM | POA: Insufficient documentation

## 2023-08-13 DIAGNOSIS — C7951 Secondary malignant neoplasm of bone: Secondary | ICD-10-CM | POA: Insufficient documentation

## 2023-08-13 NOTE — Progress Notes (Signed)
 Voicemail left.  Will reschedule

## 2023-08-18 ENCOUNTER — Inpatient Hospital Stay: Admitting: Oncology

## 2023-08-18 ENCOUNTER — Inpatient Hospital Stay

## 2023-08-18 ENCOUNTER — Encounter: Payer: Self-pay | Admitting: Oncology

## 2023-08-18 VITALS — BP 109/60 | HR 66 | Temp 96.7°F | Resp 18 | Wt 138.0 lb

## 2023-08-18 DIAGNOSIS — Z9221 Personal history of antineoplastic chemotherapy: Secondary | ICD-10-CM | POA: Diagnosis not present

## 2023-08-18 DIAGNOSIS — M549 Dorsalgia, unspecified: Secondary | ICD-10-CM

## 2023-08-18 DIAGNOSIS — I110 Hypertensive heart disease with heart failure: Secondary | ICD-10-CM | POA: Diagnosis not present

## 2023-08-18 DIAGNOSIS — C3412 Malignant neoplasm of upper lobe, left bronchus or lung: Secondary | ICD-10-CM | POA: Diagnosis not present

## 2023-08-18 DIAGNOSIS — E11319 Type 2 diabetes mellitus with unspecified diabetic retinopathy without macular edema: Secondary | ICD-10-CM | POA: Diagnosis not present

## 2023-08-18 DIAGNOSIS — Z85820 Personal history of malignant melanoma of skin: Secondary | ICD-10-CM | POA: Diagnosis not present

## 2023-08-18 DIAGNOSIS — C7951 Secondary malignant neoplasm of bone: Secondary | ICD-10-CM | POA: Diagnosis not present

## 2023-08-18 DIAGNOSIS — I619 Nontraumatic intracerebral hemorrhage, unspecified: Secondary | ICD-10-CM

## 2023-08-18 DIAGNOSIS — Z794 Long term (current) use of insulin: Secondary | ICD-10-CM | POA: Diagnosis not present

## 2023-08-18 DIAGNOSIS — E785 Hyperlipidemia, unspecified: Secondary | ICD-10-CM | POA: Diagnosis not present

## 2023-08-18 DIAGNOSIS — E1140 Type 2 diabetes mellitus with diabetic neuropathy, unspecified: Secondary | ICD-10-CM | POA: Diagnosis not present

## 2023-08-18 DIAGNOSIS — Z5112 Encounter for antineoplastic immunotherapy: Secondary | ICD-10-CM

## 2023-08-18 DIAGNOSIS — D649 Anemia, unspecified: Secondary | ICD-10-CM | POA: Diagnosis not present

## 2023-08-18 DIAGNOSIS — G893 Neoplasm related pain (acute) (chronic): Secondary | ICD-10-CM | POA: Diagnosis not present

## 2023-08-18 DIAGNOSIS — G8929 Other chronic pain: Secondary | ICD-10-CM

## 2023-08-18 DIAGNOSIS — Z8673 Personal history of transient ischemic attack (TIA), and cerebral infarction without residual deficits: Secondary | ICD-10-CM | POA: Diagnosis not present

## 2023-08-18 DIAGNOSIS — R531 Weakness: Secondary | ICD-10-CM | POA: Diagnosis not present

## 2023-08-18 DIAGNOSIS — Z87891 Personal history of nicotine dependence: Secondary | ICD-10-CM | POA: Diagnosis not present

## 2023-08-18 DIAGNOSIS — I509 Heart failure, unspecified: Secondary | ICD-10-CM | POA: Diagnosis not present

## 2023-08-18 DIAGNOSIS — M6281 Muscle weakness (generalized): Secondary | ICD-10-CM | POA: Diagnosis not present

## 2023-08-18 DIAGNOSIS — Z791 Long term (current) use of non-steroidal anti-inflammatories (NSAID): Secondary | ICD-10-CM | POA: Diagnosis not present

## 2023-08-18 DIAGNOSIS — I2510 Atherosclerotic heart disease of native coronary artery without angina pectoris: Secondary | ICD-10-CM | POA: Diagnosis not present

## 2023-08-18 DIAGNOSIS — G40909 Epilepsy, unspecified, not intractable, without status epilepticus: Secondary | ICD-10-CM | POA: Diagnosis not present

## 2023-08-18 DIAGNOSIS — Z923 Personal history of irradiation: Secondary | ICD-10-CM | POA: Diagnosis not present

## 2023-08-18 DIAGNOSIS — G629 Polyneuropathy, unspecified: Secondary | ICD-10-CM | POA: Diagnosis not present

## 2023-08-18 DIAGNOSIS — C7931 Secondary malignant neoplasm of brain: Secondary | ICD-10-CM | POA: Diagnosis not present

## 2023-08-18 DIAGNOSIS — I700 Atherosclerosis of aorta: Secondary | ICD-10-CM | POA: Diagnosis not present

## 2023-08-18 LAB — CBC WITH DIFFERENTIAL/PLATELET
Abs Immature Granulocytes: 0.03 10*3/uL (ref 0.00–0.07)
Basophils Absolute: 0.1 10*3/uL (ref 0.0–0.1)
Basophils Relative: 1 %
Eosinophils Absolute: 1.3 10*3/uL — ABNORMAL HIGH (ref 0.0–0.5)
Eosinophils Relative: 15 %
HCT: 35 % — ABNORMAL LOW (ref 39.0–52.0)
Hemoglobin: 11.1 g/dL — ABNORMAL LOW (ref 13.0–17.0)
Immature Granulocytes: 0 %
Lymphocytes Relative: 15 %
Lymphs Abs: 1.2 10*3/uL (ref 0.7–4.0)
MCH: 28.5 pg (ref 26.0–34.0)
MCHC: 31.7 g/dL (ref 30.0–36.0)
MCV: 89.7 fL (ref 80.0–100.0)
Monocytes Absolute: 0.7 10*3/uL (ref 0.1–1.0)
Monocytes Relative: 9 %
Neutro Abs: 4.9 10*3/uL (ref 1.7–7.7)
Neutrophils Relative %: 60 %
Platelets: 298 10*3/uL (ref 150–400)
RBC: 3.9 MIL/uL — ABNORMAL LOW (ref 4.22–5.81)
RDW: 14 % (ref 11.5–15.5)
WBC: 8.2 10*3/uL (ref 4.0–10.5)
nRBC: 0 % (ref 0.0–0.2)

## 2023-08-18 LAB — RETIC PANEL
Immature Retic Fract: 9.6 % (ref 2.3–15.9)
RBC.: 4.05 MIL/uL — ABNORMAL LOW (ref 4.22–5.81)
Retic Count, Absolute: 51 10*3/uL (ref 19.0–186.0)
Retic Ct Pct: 1.3 % (ref 0.4–3.1)
Reticulocyte Hemoglobin: 30 pg (ref >27.9)

## 2023-08-18 LAB — COMPREHENSIVE METABOLIC PANEL WITH GFR
ALT: 10 U/L (ref 0–44)
AST: 17 U/L (ref 15–41)
Albumin: 2.9 g/dL — ABNORMAL LOW (ref 3.5–5.0)
Alkaline Phosphatase: 77 U/L (ref 38–126)
Anion gap: 7 (ref 5–15)
BUN: 21 mg/dL (ref 8–23)
CO2: 24 mmol/L (ref 22–32)
Calcium: 8.3 mg/dL — ABNORMAL LOW (ref 8.9–10.3)
Chloride: 102 mmol/L (ref 98–111)
Creatinine, Ser: 0.77 mg/dL (ref 0.61–1.24)
GFR, Estimated: >60 mL/min (ref >60)
Glucose, Bld: 117 mg/dL — ABNORMAL HIGH (ref 70–99)
Potassium: 4.3 mmol/L (ref 3.5–5.1)
Sodium: 133 mmol/L — ABNORMAL LOW (ref 135–145)
Total Bilirubin: 0.7 mg/dL (ref 0.0–1.2)
Total Protein: 6.6 g/dL (ref 6.5–8.1)

## 2023-08-18 LAB — IRON AND TIBC
Iron: 45 ug/dL (ref 45–182)
Saturation Ratios: 16 % — ABNORMAL LOW (ref 17.9–39.5)
TIBC: 274 ug/dL (ref 250–450)
UIBC: 229 ug/dL

## 2023-08-18 LAB — TSH: TSH: 2.364 u[IU]/mL (ref 0.350–4.500)

## 2023-08-18 LAB — FOLATE: Folate: 6.3 ng/mL (ref >5.9)

## 2023-08-18 LAB — FERRITIN: Ferritin: 187 ng/mL (ref 24–336)

## 2023-08-18 MED ORDER — HEPARIN SOD (PORK) LOCK FLUSH 100 UNIT/ML IV SOLN
500.0000 [IU] | Freq: Once | INTRAVENOUS | Status: AC
  Administered 2023-08-18: 500 [IU] via INTRAVENOUS
  Filled 2023-08-18: qty 5

## 2023-08-18 MED ORDER — SODIUM CHLORIDE 0.9% FLUSH
10.0000 mL | Freq: Once | INTRAVENOUS | Status: AC
  Administered 2023-08-18: 10 mL via INTRAVENOUS
  Filled 2023-08-18: qty 10

## 2023-08-18 NOTE — Assessment & Plan Note (Addendum)
 Previously on Zometa  every 4- 6 weeks.Did not tolerate Zometa . Continue calcium  supplementation Hold Xgeva  due to hypocalcemia.

## 2023-08-18 NOTE — Assessment & Plan Note (Signed)
 Back pain, MRI lumbar showed DJD, L4-L5 synovial cyst,PET scan showed L4 fracture Continue PRN percocet 7.5mg /325mg , fentanyl  patch 75mcg Q72 hours Follow up with palliative care

## 2023-08-18 NOTE — Progress Notes (Signed)
 Hematology/Oncology Progress note [phone] [phone]       CHIEF COMPLAINTS/REASON FOR VISIT:  Metastatic non-small cell lung cancer  ASSESSMENT & PLAN:   Cancer Staging  Primary non-small cell carcinoma of upper lobe of left lung (HCC) Staging form: Lung, AJCC 8th Edition - Clinical: Stage IV (cT2b, cN1, cM1) - Signed by [name], MD on 08/06/2021   Primary non-small cell carcinoma of upper lobe of left lung (HCC) Stage IV lung adenocarcinoma with brain and bone metastasis.  S/p lung radiation to left lung.  January 2025, CT and PET scan results were reviewed. Stable disease. No progression or new disease.  Given the progressive weakness, recommend to hold off Keytruda . He is not candidate for other alternative treatments. Repeat CT chest abdomen pelvis for evaluation of disease status. Follow-up with palliative care service for goals of care discussion.  If he develops disease progression, I think is reasonable to consider comfort/hospice.  Metastasis to bone [facility]) Previously on Zometa  every 4- 6 weeks.Did not tolerate Zometa . Continue calcium  supplementation Hold Xgeva  due to hypocalcemia.  Neuropathy Fingertip neuropathy intermittent Grade 1, feet neuropathy persistent, Grade 2.  On Gabapentin  300mg  TID     Encounter for antineoplastic immunotherapy Immunotherapy plan as listed above  Back pain Back pain, MRI lumbar showed DJD, L4-L5 synovial cyst,PET scan showed L4 fracture Continue PRN percocet 7.5mg /325mg , fentanyl  patch 75mcg Q72 hours Follow up with palliative care     Muscle weakness Chronic ongoing issue, progressively worse.  Likely multifactorial, due to deconditioning, multiple foci spinal stenosis, possible drug side effects. Keytruda  may cause myositis, previously holding Keytruda  did not improve his symptoms. Recommend to hold off Keytruda  treatments. Obtain MRI brain with and without contrast. Recommend patient to make  follow-up appointment with neurology for further evaluation.  Normocytic anemia Hemoglobin is trending down. Lab Results  Component Value Date   HGB 11.1 (L) 08/18/2023   TIBC 274 08/18/2023   IRONPCTSAT 16 (L) 08/18/2023   FERRITIN 187 08/18/2023    This is not consistent with typical iron  deficiency.  Likely anemia due to chronic disease.   Follow up 3 weeks All questions were answered. The patient knows to call the clinic with any problems, questions or concerns.  [name], MD, [name] [facility] Hematology Oncology 08/18/2023      HISTORY OF PRESENTING ILLNESS:   Micheal Donovan is a  73 y.o.  male presents for follow up of Non-small cell lung cancer.  Oncology History Overview Note  Diagnosis: Stage IIB T2b N1 M0 adenocarcinoma of the LUL, poorly differentiated     Primary non-small cell carcinoma of upper lobe of left lung (HCC)  06/13/2021 Initial Diagnosis   Primary non-small cell carcinoma of upper lobe of left lung [facility]) -06/20/2021 - 06/27/2021, patient presented to [facility] due to progressive headache/dizziness/gait changes. CT head showed acute to subacute intraparenchymal hemorrhage involving the left cerebellum.  Surrounding low-density vasogenic edema.  Patient was transferred to [facility]. This was further evaluated by CT angiogram of the neck which showed bulky calcified plaques/stenosis of carotid artery, Followed by MRI brain. 06/12/2021, MRI of the brain showed no significant interval change in size of the left cerebellar intraparenchymal hematoma with unchanged regional mass effect and partial effacement of fourth ventricle but no upstream hydrocephalus. There is no discernible enhancement to suggest underlying mass lesion, though acute blood products could mask enhancement.   06/12/2021 a chest x-ray showed a 4.4 cm left middle lobe. 06/13/2021, CT chest with contrast  showed a 4.3 x 3.6 x 3.3 cm lobular spiculated mass in the posterior left upper lobe  with T3 to the lateral pleura and major fissure.  Metastatic left hilar lymphadenopathy.  Peripheral micronodularity posterior right costophrenic sulcus.  Aortic atherosclerosis. 06/14/2021, CT abdomen pelvis showed right lower lobe pulmonary artery embolus.  No evidence of right heart strain.  No acute intra-abdominal or pelvic pathology.  Aortic atherosclerosis.  06/13/2021, patient underwent bronchoscopy with EBUS by Dr. Felipe Horton.  Biopsy from the fine-needle aspiration of station 11 mL showed malignant cells, consistent with poorly differentiated non-small cell carcinoma, consistent with adenocarcinoma.  Malignant cells are TTF-1 positive and negative for p40.  Negative for neuroendocrine markers.  Blood test and tissue sample were tested for Gardant 360  -PD-L1 TPS 97%, no actionable mutation on the blood testing. 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   07/17/2021 Cancer Staging   Staging form: Lung, AJCC 8th Edition - Clinical: Stage IV (cT2b, cN1, cM1) - Signed by [name], MD on 08/06/2021   08/06/2021 Imaging   I saw patient's PET scan after his visit with me on 08/06/21. PET scan was ordered by his previous oncologist group and did not come to my in basket.. Patient received cycle 1 carboplatin  Taxol  today.  He has started on radiation.  5/26/3 PET scan showed 3.8 cm hypermetabolic left upper lobe mass with hypermetabolic left hilar and infrahilar adenopathy.  There is approximately 8 scattered metastatic lesions in the skeleton. Mixed density photopenic lesion in the left cerebellum. characterized as hemorrhage on recent prior imaging workups.    08/16/2021 -  Chemotherapy   Patient is on Treatment Plan : LUNG Carboplatin  (5) + Pemetrexed  + Pembrolizumab  (200) D1 q21d Induction x 4 cycles / Maintenance Pemetrexed  + Pembrolizumab  (200) D1 q21d      08/22/2021 Imaging   MRI brain w wo contrast  Decrease in size of left cerebellar hematoma with resolution of edema. No evidence of  underlying lesion. Smaller, more recent hemorrhage in the left cerebellar vermis with minimal edema. There is minimal enhancement without definite evidence of underlying lesion.  New punctate focus of chronic blood products and enhancement in the left frontal lobe. Additional new foci of chronic blood products in the posterior right putamen, right parietal subcortical white matter, and posterior right cerebellum. Unclear at this time if these represent foci of bland hemorrhage or early metastases.   Increase in size of right parietal osseous metastasis with minor extraosseous extension.     Imaging   PET scan showed 1. Interval response to therapy as evidenced by a small residual left upper lobe nodule with decreased hypermetabolism, no residual hypermetabolic adenopathy and decreased hypermetabolism associatedwith osseous metastases. 2. 6 mm posterior left upper lobe nodule, likely stable. Recommend attention on follow-up. 3. Aortic atherosclerosis (ICD10-I70.0). Coronary artery calcification.   12/17/2021 Imaging   MRI lumbar spine w wo contrast  1. No evidence of regional metastatic disease. 2. Late subacute superior endplate fracture at L2 and large superior endplate Schmorl's node at L5, unchanged since the PET scan of 10/29/2021. 3. L3-4: Shallow disc protrusion. Facet and ligamentous hypertrophy. Stenosis of both lateral recesses. Findings slightly worsened since 2022. 4. L4-5: Shallow disc protrusion. Facet and ligamentous hypertrophy. Small synovial cyst arising from the facet joint on the right. Stenosis of the lateral recesses right worse than left. Foraminal narrowing right worse than left. Findings have worsened since 2022.  5. L5-S1: Endplate osteophytes and shallow protrusion of the disc. Facet and ligamentous hypertrophy.  Stenosis of the subarticular lateral recesses and neural foramina, right worse than left. Similar appearance to the study of 2022. 6. Continued evidence of  enteritis of at least 1 loop of small bowel. Distended bladder present   12/18/2021 Imaging   Brian MRI w wo  1. Interval decrease in size of the left cerebellar and vermian hematomas. No definite evidence of underlying metastatic lesion. 2. There is are two possible contrast enhancing lesions in the posterior left frontal lobe and left occipital lobe, which do not have intrinsic T1 signal abnormality, but have a somewhat linear appearance and may be vascular in nature. Recommend attention on follow up. 3. Multifocal sites of susceptibility artifact are redemonstrated with interval development of a few new foci, as described above. All of these sites demonstrate intrinsic T1 hyperintense signal abnormality and susceptibility artifact and are compatible with sites of microhemorrhages. Recommend continued attention on follow up. 4. Interval decrease in size of right parietal calvarial metastatic lesion. No new contrast enhancing lesions visualized. 5. Diffusely heterogeneous marrow signal throughout the cervical spine, which is nonspecific but can be seen in the setting of anemia, smoking, obesity, or a marrow replacement process.     12/18/2021 Imaging   MRI lumbar spine w wo contrast  1. No evidence of regional metastatic disease. 2. Late subacute superior endplate fracture at L2 and large superior endplate Schmorl's node at L5, unchanged since the PET scan of 10/29/2021 3. L3-4: Shallow disc protrusion. Facet and ligamentous hypertrophy.Stenosis of both lateral recesses. Findings slightly worsened since 2022. 4. L4-5: Shallow disc protrusion. Facet and ligamentous hypertrophy. Small synovial cyst arising from the facet joint on the right. Stenosis of the lateral recesses right worse than left. Foraminal narrowing right worse than left. Findings have worsened since 2022. 5. L5-S1: Endplate osteophytes and shallow protrusion of the disc. Facet and ligamentous hypertrophy. Stenosis of the  subarticular lateral recesses and neural foramina, right worse than left. Similar appearance to the study of 2022. 6. Continued evidence of enteritis of at least 1 loop of small bowel. Distended bladder present.       01/28/2022 Imaging   CT chest abdomen pelvis w contrast 1. Treated left upper lobe mass appears grossly stable to the prior examination when measured in a similar fashion on the prior study. No definite signs of extra skeletal metastatic disease noted in the chest, abdomen or pelvis. 2. Widespread skeletal metastases redemonstrated, as above. 3. Hepatic steatosis. 4. Aortic atherosclerosis, in addition to left main and three-vessel coronary artery disease. Assessment for potential risk factor modification, dietary therapy or pharmacologic therapy may be warranted, if clinically indicated. 5. Additional incidental findings,    04/08/2022 Imaging   MRI lumbar spine w wo contrast  1. Stable remote superior endplate fracture of L2 and large Schmorl to noted involving L5. No worrisome bone lesions to suggest metastatic disease. 2. Degenerative lumbar spondylosis with multilevel disc disease and facet disease which appears relatively stable as detailed above. 3. Enlarging right-sided synovial cyst at L4-5 with progressive mass effect on the right side of the thecal sac. This could potentially be symptomatic   04/28/2022 Imaging   CT chest abdomen pelvis wo contrast  1. Similar versus slightly decreased size of treated left upper lobe primary bronchogenic carcinoma. 2. Similar osseous metastasis. 3. No evidence of extraosseous metastatic disease. 4. New small right pleural effusion. 5. Coronary artery atherosclerosis. Aortic Atherosclerosis   07/03/2022 Imaging   PET scan showed 1. Interval development of a small focus of intense  hypermetabolism within the left upper lobe scar. This is associated with a 5 mm perifissural nodule in the left lung which is hypermetabolic.  Hypermetabolism at both of these locations is new since prior PET-CT and highly suspicious for recurrent disease. 2. No evidence for hypermetabolic soft tissue metastatic disease in the neck, abdomen, or pelvis. 3. Similar appearance of sclerotic and lytic bone lesions without hypermetabolism on PET imaging. 4. Focal hypermetabolism associated with a fracture of the right L4 transverse process. The fracture was visible on the previous CT of 04/28/2022. No associated soft tissue lesion to suggest that this is pathologic in nature. Tracer accumulation on today's study is in keeping with healing fracture. There is also some minimal uptake in a prominent spur associated with the L4 superior endplate, most likely degenerative. 5.  Aortic Atherosclerosis    08/01/2022 - 08/14/2022 Radiation Therapy   Radiation to left lung.    10/01/2022 Imaging   chest angiogram PE protocol  No pulmonary embolism identified.   Stable nodular opacity in the left upper lobe. Please correlate for history of treated lung cancer. There is an adjacent small nodule along the course of the interlobar fissure which is slightly larger today compared to the study of February 2024. please correlate with findings of the recent PET-CT of 07/03/2022.      10/02/2022 Imaging   MRI brain with and without contrast 1. No acute intracranial abnormality. No evidence for intraparenchymal metastatic disease. 2. Chronic left cerebellar hemorrhage with multiple additional chronic micro hemorrhages elsewhere throughout the brain, stable. 3. Underlying mild chronic microvascular ischemic disease.    10/02/2022 Imaging   MRI lumbar spine showed 1. No MRI evidence for acute infection or other abnormality within the lumbar spine. 2. Few subcentimeter T1 hypointense lesions about the visualized posterior iliac wings. These correspond with small sclerotic foci seen on most recent CT from 04/08/2022, presumably reflecting patient's known  osseous metastatic disease. These were not hypermetabolic on prior PET-CT from 91/47/8295. 3. No other evidence for metastatic disease within the lumbar spine itself. 4. Chronic L2 compression fracture, stable. Large Schmorl's node deformity with associated height loss at L5, also stable. 5. Multifactorial degenerative changes at L3-4 through L5-S1 with resultant mild to moderate bilateral subarticular stenosis, with mild to moderate bilateral L3 through L5 foraminal narrowing as above.   12/24/2022 Imaging   CT chest w contrast   1. Stable appearance of treated lung lesion within the left upper lobe with surrounding scarring and architectural distortion. 2. The previous FDG avid subpleural nodule abutting the oblique fissure of the left lung (adjacent to post treatment changes) is again noted measuring 7 mm. This is stable from 10/01/2022. On the PET-CT from 07/03/2022 this nodule was measured at 5 mm. 3. New bandlike area of subpleural consolidation within the lateral and posterior left lower lobe is identified. This is favored to represent post treatment change. 4. Stable sclerotic lesions involving the T12 and L1 vertebra. 5. Coronary artery calcifications. 6.  Aortic Atherosclerosis    03/06/2023 Imaging   CT chest abdomen pelvis wo contrast showed  Increasing opacity along the lingula with the increasing nodular central component compared to the recent CT scan.   There is also some changing opacities in the left lower lobe. The larger more peripheral area in the left lower lobe is decreasing with some new areas of subtle ground-glass more caudal. Attention on follow-up.   No developing new other mass lesion or nodal enlargement.   Scattered sclerotic bone lesions has  a similar distribution to previous.   Overall evaluation for solid organ pathology including solid organ metastases are limited without the advantage of IV contrast.     03/23/2023 Imaging   PET scan  showed 1. Post radiation consolidation in the lingula with mild residual focal hypermetabolism, overall improved from 07/03/2022. No evidence of disease progression. 2. Quiescent osseous metastatic disease. 3. Aortic atherosclerosis (ICD10-I70.0). Coronary artery calcification.   # Patient has a history of melanoma on his neck, treated in 2009. # History of hemorrhagic infarct left cerebellum  10/01/2022 - 10/06/2022 patient was admitted to the hospital due to lower extremity weakness and intermittent aphasia. Patient reports acute on chronic left lower extremity weakness, weakness has been more prominent over the past 2 weeks.  He reports significant episode of difficulty with ambulation 2 days after his last Keytruda  treatments.  He reports that he felt loss of control of muscle at that time.  Patient reports that the weakness is different than her chronic lower extremity  weakness.  Patient was found to have orthostatic hypotension Flomax  irbesartan  were held.  CK wnl, Neurology was consulted and started the patient on Keppra  for possible seizure.  EEG was negative.  01/31/23 He recently presented to ER due to altered mental status and shaking episodes.  CT head wo negative. UA positive for nitrate and leukocyte esterase. Code sepsis was actived in ER and he was recommended for treatment of UTI.  He declined admission and went home with Keflex  500mg  4 times daily for 10 days.  Lower extremity weakness localizes best to lumbosacral nerve roots, known spondylosis with multiple foci of neuro-foraminal stenosis.  02/09/2023 S/p decompression neurosurgery   04/17/2023 - 04/21/2023 hospitalized due to seizure, and UTI he was loaded with Keppra . Seen by neurology in office. UTI was treated with IV rocephin , discharged on Cipro .  INTERVAL HISTORY Micheal Donovan is a 73 y.o. male who has above history reviewed by me today presents for follow up visit for management of  Stage IV lung  adenocarcinoma Accompanied by spouse He experiences progressive weakness and fatigue, with a daily increase in weakness. He requires assistance from his caregiver for mobility, particularly when getting into the car. He is unable to lift his foot or perform movements without pain. He is unable to perform basic movements or assist in his own mobility. Physical therapy has been attempted, but he experiences too much pain to participate effectively. He uses a condom catheter, reducing the need to stand for bathroom use. His caregiver notes he cannot sit up or roll over without assistance and has developed a sore on his buttocks due to immobility.  He reports a good appetite and no issues with eating.   Review of Systems  Constitutional:  Positive for fatigue. Negative for appetite change, chills, fever and unexpected weight change.  HENT:   Negative for hearing loss and voice change.   Eyes:  Negative for eye problems and icterus.  Respiratory:  Positive for shortness of breath. Negative for chest tightness and cough.   Cardiovascular:  Positive for leg swelling. Negative for chest pain.  Gastrointestinal:  Negative for abdominal distention, abdominal pain and blood in stool.  Endocrine: Negative for hot flashes.  Genitourinary:  Negative for difficulty urinating, dysuria and frequency.   Musculoskeletal:  Positive for arthralgias and back pain.       Chronic lower extremity weakness  Skin:  Negative for itching and rash.  Neurological:  Negative for extremity weakness, headaches, light-headedness and numbness.  Hematological:  Negative for adenopathy. Does not bruise/bleed easily.  Psychiatric/Behavioral:  Negative for confusion.     MEDICAL HISTORY:  Past Medical History:  Diagnosis Date   Allergy    Anxiety    BPH (benign prostatic hypertrophy)    Cancer (HCC)    Melanoma on Neck    2008   Carotid artery occlusion    CHF (congestive heart failure) (HCC)    Diabetes mellitus     type 2   ED (erectile dysfunction)    GERD (gastroesophageal reflux disease)    Hemorrhagic stroke (HCC) 06/2021   Hyperlipidemia    Hypertension    Lung cancer (HCC)    Neoplasm related pain    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)    Stroke [facility])     SURGICAL HISTORY: Past Surgical History:  Procedure Laterality Date   ANTERIOR CERVICAL DECOMP/DISCECTOMY FUSION N/A 02/09/2023   Procedure: C3-5 ANTERIOR CERVICAL DISCECTOMY AND FUSION;  Surgeon: Jodeen Munch, MD;  Location: [facility] ORS;  Service: Neurosurgery;  Laterality: N/A;   BRONCHIAL NEEDLE ASPIRATION BIOPSY  06/17/2021   Procedure: BRONCHIAL NEEDLE ASPIRATION BIOPSIES;  Surgeon: Josiah Nigh, MD;  Location: [facility] ENDOSCOPY;  Service: Pulmonary;;   IR IMAGING GUIDED PORT INSERTION  08/05/2021   MELANOMA EXCISION  2008   Left side of neck   RADIOLOGY WITH ANESTHESIA N/A 04/05/2020   Procedure: MRI SPINE WITOUT CONTRAST;  Surgeon: Radiologist, Medication, MD;  Location: [facility] OR;  Service: Radiology;  Laterality: N/A;   RADIOLOGY WITH ANESTHESIA N/A 08/22/2021   Procedure: MRI BRAIN WITH AND WITHOUT CONTRAST  WITH ANESTHESIA;  Surgeon: Radiologist, Medication, MD;  Location: [facility] OR;  Service: Radiology;  Laterality: N/A;   RADIOLOGY WITH ANESTHESIA N/A 12/17/2021   Procedure: MRI BRAIN WITH AND WITHOUT CONTRAST WITH ANESTHESIA; MRI LUMBER WITH AND WITHOUT CONTRAST;  Surgeon: Radiologist, Medication, MD;  Location: [facility] OR;  Service: Radiology;  Laterality: N/A;   RADIOLOGY WITH ANESTHESIA N/A 04/08/2022   Procedure: MRI LUMBER SPINE WITH AND WITHOUT CONTRAST;  Surgeon: Radiologist, Medication, MD;  Location: [facility] OR;  Service: Radiology;  Laterality: N/A;   TONSILLECTOMY     VIDEO BRONCHOSCOPY WITH ENDOBRONCHIAL ULTRASOUND N/A 06/17/2021   Procedure: VIDEO BRONCHOSCOPY WITH ENDOBRONCHIAL ULTRASOUND;  Surgeon: Josiah Nigh, MD;  Location: [facility] ENDOSCOPY;  Service: Pulmonary;  Laterality: N/A;    SOCIAL HISTORY: Social History    Socioeconomic History   Marital status: Married    Spouse name: Not on file   Number of children: Not on file   Years of education: Not on file   Highest education level: Associate degree: occupational, Scientist, product/process development, or vocational program  Occupational History   Not on file  Tobacco Use   Smoking status: Former    Current packs/day: 0.00    Types: Cigarettes    Quit date: 07/21/1990    Years since quitting: 33.0   Smokeless tobacco: Never  Vaping Use   Vaping status: Never Used  Substance and Sexual Activity   Alcohol use: No   Drug use: No   Sexual activity: Not Currently  Other Topics Concern   Not on file  Social History Narrative   Not on file   Social Drivers of Health   Financial Resource Strain: Low Risk  (05/27/2023)   Overall Financial Resource Strain (CARDIA)    Difficulty of Paying Living Expenses: Not very hard  Food Insecurity: No Food Insecurity (05/27/2023)   Hunger Vital Sign    Worried About Running Out of Food in  the Last Year: Never true    Ran Out of Food in the Last Year: Never true  Transportation Needs: No Transportation Needs (05/27/2023)   PRAPARE - Administrator, Civil Service (Medical): No    Lack of Transportation (Non-Medical): No  Physical Activity: Unknown (05/27/2023)   Exercise Vital Sign    Days of Exercise per Week: 0 days    Minutes of Exercise per Session: Not on file  Stress: Stress Concern Present (05/27/2023)   [employer] of Occupational Health - Occupational Stress Questionnaire    Feeling of Stress : To some extent  Social Connections: Unknown (05/27/2023)   Social Connection and Isolation Panel    Frequency of Communication with Friends and Family: Once a week    Frequency of Social Gatherings with Friends and Family: Patient declined    Attends Religious Services: Patient unable to answer    Active Member of Clubs or Organizations: No    Attends Banker Meetings: Patient unable to answer     Marital Status: Married  Catering manager Violence: Not At Risk (04/22/2023)   Humiliation, Afraid, Rape, and Kick questionnaire    Fear of Current or Ex-Partner: No    Emotionally Abused: No    Physically Abused: No    Sexually Abused: No    FAMILY HISTORY: Family History  Problem Relation Age of Onset   COPD Mother    Clotting disorder Mother    Heart disease Father    Heart disease Brother        MI at age 54   Clotting disorder Maternal Grandmother    Stroke Neg Hx     ALLERGIES:  is allergic to niaspan  [niacin ].  MEDICATIONS:  Current Outpatient Medications  Medication Sig Dispense Refill   ALPRAZolam  (XANAX ) 0.5 MG tablet Take 1 tablet (0.5 mg total) by mouth at bedtime as needed for anxiety. 15 tablet 0   citalopram  (CELEXA ) 10 MG tablet Take 1 tablet (10 mg total) by mouth daily. 30 tablet 3   docusate sodium  (COLACE) 100 MG capsule Take 100 mg by mouth 2 (two) times daily as needed for mild constipation.     fentaNYL  (DURAGESIC ) 75 MCG/HR Place 1 patch onto the skin every 3 (three) days. 10 patch 0   gabapentin  (NEURONTIN ) 300 MG capsule Take 2 capsules (600 mg total) by mouth 2 (two) times daily. 120 capsule 2   insulin  glargine (LANTUS ) 100 UNIT/ML injection Inject 0-30 Units into the skin daily as needed (High Blood Sugar over 150).     Insulin  Pen Needle (B-D UF III MINI PEN NEEDLES) 31G X 5 MM MISC Use to inject insulin  daily. 100 each 3   lactulose  (CHRONULAC ) 10 GM/15ML solution Take 15 mLs (10 g total) by mouth 3 (three) times daily as needed. 236 mL 0   levETIRAcetam  (KEPPRA ) 500 MG tablet Take 1 tablet (500 mg total) by mouth 2 (two) times daily. 180 tablet 3   megestrol  (MEGACE ) 40 MG tablet Take 1 tablet (40 mg total) by mouth daily. 30 tablet 3   meloxicam  (MOBIC ) 7.5 MG tablet Take 1 tablet (7.5 mg total) by mouth daily. 30 tablet 3   methocarbamol  (ROBAXIN ) 500 MG tablet Take 1 tablet (500 mg total) by mouth every 6 (six) hours as needed for muscle spasms.  120 tablet 0   NOVOLOG  FLEXPEN 100 UNIT/ML FlexPen Inject 0-10 Units into the skin daily as needed for high blood sugar (BG > 200).     Nystatin  (  GERHARDT'S BUTT CREAM) CREA Apply 14 Applications topically 2 (two) times daily. 1 each 0   omeprazole  (PRILOSEC) 20 MG capsule Take 20 mg by mouth daily.     oxyCODONE -acetaminophen  (PERCOCET) 7.5-325 MG tablet Take 1 tablet by mouth every 4 (four) hours as needed for severe pain (pain score 7-10). Do not take with cough medication or other pain medication 60 tablet 0   senna (SENOKOT) 8.6 MG TABS tablet Take 1 tablet (8.6 mg total) by mouth 2 (two) times daily as needed for mild constipation. 30 tablet 0   tamsulosin  (FLOMAX ) 0.4 MG CAPS capsule TAKE 1 CAPSULE(0.4 MG) BY MOUTH DAILY 30 capsule 2   No current facility-administered medications for this visit.   Facility-Administered Medications Ordered in Other Visits  Medication Dose Route Frequency Provider Last Rate Last Admin   heparin  lock flush 100 UNIT/ML injection            sodium chloride  flush (NS) 0.9 % injection 10 mL  10 mL Intracatheter PRN [name], MD   10 mL at 07/07/23 1434     PHYSICAL EXAMINATION:  Vitals:   08/18/23 1307  BP: 109/60  Pulse: 66  Resp: 18  Temp: (!) 96.7 F (35.9 C)  SpO2: 100%   Filed Weights   08/18/23 1307  Weight: 138 lb (62.6 kg)    Physical Exam Constitutional:      General: He is not in acute distress.    Appearance: He is ill-appearing.     Comments: Patient sits in the wheelchair  HENT:     Head: Normocephalic and atraumatic.   Eyes:     General: No scleral icterus.   Cardiovascular:     Rate and Rhythm: Normal rate and regular rhythm.     Heart sounds: Normal heart sounds.  Pulmonary:     Effort: Pulmonary effort is normal. No respiratory distress.     Breath sounds: No wheezing.     Comments: Decreased breath sound bilaterally. Abdominal:     General: Bowel sounds are normal. There is no distension.     Palpations: Abdomen  is soft.   Musculoskeletal:        General: No deformity. Normal range of motion.     Cervical back: Normal range of motion and neck supple.     Comments: Bilateral ankle trace edema.   Skin:    Findings: No rash.   Neurological:     Mental Status: He is alert. Mental status is at baseline.     Cranial Nerves: No cranial nerve deficit.     Comments: Chronic left-sided weakness  Psychiatric:        Mood and Affect: Mood normal.     LABORATORY DATA:  I have reviewed the data as listed    Latest Ref Rng & Units 08/18/2023   12:52 PM 07/29/2023    1:22 PM 07/07/2023   12:37 PM  CBC  WBC 4.0 - 10.5 K/uL 8.2  8.2  7.4   Hemoglobin 13.0 - 17.0 g/dL 91.4  78.2  95.6   Hematocrit 39.0 - 52.0 % 35.0  37.8  37.9   Platelets 150 - 400 K/uL 298  325  270       Latest Ref Rng & Units 08/18/2023   12:52 PM 07/29/2023    1:22 PM 07/07/2023   12:37 PM  CMP  Glucose 70 - 99 mg/dL 213  086  578   BUN 8 - 23 mg/dL 21  23  18  Creatinine 0.61 - 1.24 mg/dL 1.61  0.96  0.45   Sodium 135 - 145 mmol/L 133  134  135   Potassium 3.5 - 5.1 mmol/L 4.3  4.2  3.8   Chloride 98 - 111 mmol/L 102  104  101   CO2 22 - 32 mmol/L 24  24  24    Calcium  8.9 - 10.3 mg/dL 8.3  7.9  8.3   Total Protein 6.5 - 8.1 g/dL 6.6  6.1  6.3   Total Bilirubin 0.0 - 1.2 mg/dL 0.7  0.7  0.5   Alkaline Phos 38 - 126 U/L 77  64  68   AST 15 - 41 U/L 17  19  18    ALT 0 - 44 U/L 10  10  9       Iron /TIBC/Ferritin/ %Sat    Component Value Date/Time   IRON  45 08/18/2023 1358   TIBC 274 08/18/2023 1358   FERRITIN 187 08/18/2023 1358   IRONPCTSAT 16 (L) 08/18/2023 1358      RADIOGRAPHIC STUDIES: I have personally reviewed the radiological images as listed and agreed with the findings in the report. DG Abdomen 1 View Result Date: 07/29/2023 CLINICAL DATA:  Constipation for 6 days. EXAM: ABDOMEN - 1 VIEW COMPARISON:  PET-CT 03/17/2023. FINDINGS: The bowel gas pattern is nonobstructive with diffuse moderate fecal stasis and  retained stool to the proximal sigmoid segment. This is increased since 03/17/2023. No radio-opaque calculi or acute radiographic abnormality are seen. Multifocal bone metastases in the spine and sclerotic metastatic deposit in the right iliac wing, are better demonstrated on CT. There are newly noted electrodes superimposing to the left and right of the L4 vertebral body. There is plastic tubing superimposing over right true pelvis. Scattered iliofemoral calcific arteriosclerosis. IMPRESSION: 1. Nonobstructive bowel gas pattern with diffuse moderate fecal stasis and impacted stool to the proximal sigmoid segment. This is increased since 03/17/2023. 2. Multifocal bone metastases in the spine and sclerotic metastatic deposit in the right iliac wing, better demonstrated on CT. 3. Newly noted electrodes superimposing to the left and right of the L4 vertebral body. Electronically Signed   By: Denman Fischer M.D.   On: 07/29/2023 22:24   DG PAIN CLINIC C-ARM 1-60 MIN NO REPORT Result Date: 07/20/2023 Fluoro was used, but no Radiologist interpretation will be provided. Please refer to NOTES tab for provider progress note.

## 2023-08-18 NOTE — Assessment & Plan Note (Addendum)
 Hemoglobin is trending down. Lab Results  Component Value Date   HGB 11.1 (L) 08/18/2023   TIBC 274 08/18/2023   IRONPCTSAT 16 (L) 08/18/2023   FERRITIN 187 08/18/2023    This is not consistent with typical iron  deficiency.  Likely anemia due to chronic disease.

## 2023-08-18 NOTE — Assessment & Plan Note (Signed)
 Immunotherapy plan as listed above

## 2023-08-18 NOTE — Assessment & Plan Note (Addendum)
 Chronic ongoing issue, progressively worse.  Likely multifactorial, due to deconditioning, multiple foci spinal stenosis, possible drug side effects. Keytruda  may cause myositis, previously holding Keytruda  did not improve his symptoms. Recommend to hold off Keytruda  treatments. Obtain MRI brain with and without contrast. Recommend patient to make follow-up appointment with neurology for further evaluation.

## 2023-08-18 NOTE — Assessment & Plan Note (Signed)
 Fingertip neuropathy intermittent Grade 1, feet neuropathy persistent, Grade 2.  On Gabapentin 300mg  TID

## 2023-08-18 NOTE — Assessment & Plan Note (Addendum)
 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  S/p lung radiation to left lung.  January 2025, CT and PET scan results were reviewed. Stable disease. No progression or new disease.  Given the progressive weakness, recommend to hold off Keytruda . He is not candidate for other alternative treatments. Repeat CT chest abdomen pelvis for evaluation of disease status. Follow-up with palliative care service for goals of care discussion.  If he develops disease progression, I think is reasonable to consider comfort/hospice.

## 2023-08-19 ENCOUNTER — Other Ambulatory Visit: Payer: Self-pay | Admitting: Hospice and Palliative Medicine

## 2023-08-19 ENCOUNTER — Encounter: Payer: Self-pay | Admitting: Student in an Organized Health Care Education/Training Program

## 2023-08-19 ENCOUNTER — Ambulatory Visit
Admission: RE | Admit: 2023-08-19 | Discharge: 2023-08-19 | Disposition: A | Discharge status: Home / Self Care | Source: Ambulatory Visit | Attending: Oncology | Admitting: Oncology

## 2023-08-19 ENCOUNTER — Ambulatory Visit
Admission: RE | Admit: 2023-08-19 | Discharge: 2023-08-19 | Disposition: A | Discharge status: Home / Self Care | Source: Ambulatory Visit | Attending: Radiation Oncology | Admitting: Radiation Oncology

## 2023-08-19 ENCOUNTER — Telehealth: Payer: Self-pay | Admitting: Student in an Organized Health Care Education/Training Program

## 2023-08-19 ENCOUNTER — Ambulatory Visit (HOSPITAL_BASED_OUTPATIENT_CLINIC_OR_DEPARTMENT_OTHER): Admitting: Student in an Organized Health Care Education/Training Program

## 2023-08-19 VITALS — BP 102/52 | HR 73 | Temp 97.9°F | Resp 16 | Ht 67.0 in | Wt 140.0 lb

## 2023-08-19 DIAGNOSIS — C7951 Secondary malignant neoplasm of bone: Secondary | ICD-10-CM | POA: Insufficient documentation

## 2023-08-19 DIAGNOSIS — M47816 Spondylosis without myelopathy or radiculopathy, lumbar region: Secondary | ICD-10-CM | POA: Diagnosis not present

## 2023-08-19 DIAGNOSIS — C801 Malignant (primary) neoplasm, unspecified: Secondary | ICD-10-CM | POA: Diagnosis not present

## 2023-08-19 DIAGNOSIS — G894 Chronic pain syndrome: Secondary | ICD-10-CM | POA: Diagnosis not present

## 2023-08-19 DIAGNOSIS — I619 Nontraumatic intracerebral hemorrhage, unspecified: Secondary | ICD-10-CM

## 2023-08-19 DIAGNOSIS — J90 Pleural effusion, not elsewhere classified: Secondary | ICD-10-CM | POA: Diagnosis not present

## 2023-08-19 MED ORDER — IOHEXOL 300 MG/ML  SOLN
100.0000 mL | Freq: Once | INTRAMUSCULAR | Status: AC | PRN
  Administered 2023-08-19: 100 mL via INTRAVENOUS

## 2023-08-19 NOTE — Telephone Encounter (Signed)
 Patient has PNS and went to have MRI done, they canceled stating they cannot do MRI with PNS in. Patient wants it pulled out today. Says if it doesn't get out today he will pull it out.

## 2023-08-19 NOTE — Progress Notes (Unsigned)
 1600 dR. lATEEF IN FOR LEAD PULL. LEADS INTACT AND SITES CLEAR. wOUND CARE INSTRUCTIONS GIVEN TO PATIENT AND CAREGIVER ALONG WITH S/S TO REPORT TO MD.

## 2023-08-19 NOTE — Progress Notes (Unsigned)
 Safety precautions to be maintained throughout the outpatient stay will include: orient to surroundings, keep bed in low position, maintain call bell within reach at all times, provide assistance with transfer out of bed and ambulation.

## 2023-08-19 NOTE — Progress Notes (Signed)
 PROVIDER NOTE: Interpretation of information contained herein should be left to medically-trained personnel. Specific patient instructions are provided elsewhere under Patient Instructions section of medical record. This document was created in part using AI and STT-dictation technology, any transcriptional errors that may result from this process are unintentional.  Patient: Micheal Donovan  Service: E/M   PCP: Micheal Lefort, MD  DOB: [birth year]  DOS: 08/19/2023  Provider: Cephus Collin, MD  MRN: [id number]  Delivery: Face-to-face  Specialty: Interventional Pain Management  Type: Established Patient  Setting: Ambulatory outpatient facility  Specialty designation: 09  Referring Prov.: Micheal Lefort, MD  Location: Outpatient office facility       History of present illness (HPI) [name], a 73 y.o. year old male, is here today because of his Lumbar facet arthropathy [M47.816]. Mr. Berling primary complain today is Back Pain   Pain Assessment: Severity of Chronic pain is reported as a 6 /10. Location: Back  /down legs bilat. Onset: More than a month ago. Quality: Other (Comment) (low back pain). Timing: Constant. Modifying factor(s): rest. Vitals:  height is 5' 7 (1.702 m) and weight is 140 lb (63.5 kg). His temperature is 97.9 F (36.6 C). His pulse is 73. His respiration is 16 and oxygen saturation is 97%.  BMI: Estimated body mass index is 21.93 kg/m as calculated from the following:   Height as of this encounter: 5' 7 (1.702 m).   Weight as of this encounter: 140 lb (63.5 kg).  Last encounter: 05/12/2023. Last procedure: 07/29/2023.  Reason for encounter: Removal of Sprint peripheral nerve stimulator leads.  Both leads removed with tips intact.  No skin breakdown at region of insertion.  Skin appears clean, dry, not erythematous.  ROS  Constitutional: Denies any fever or chills Gastrointestinal: No reported hemesis, hematochezia, vomiting, or acute GI  distress Musculoskeletal: Persistent low back pain Neurological: No reported episodes of acute onset apraxia, aphasia, dysarthria, agnosia, amnesia, paralysis, loss of coordination, or loss of consciousness  Medication Review  ALPRAZolam , Gerhardt's butt cream, Insulin  Pen Needle, citalopram , docusate sodium , fentaNYL , gabapentin , insulin  aspart, insulin  glargine, lactulose , levETIRAcetam , megestrol , meloxicam , methocarbamol , omeprazole , oxyCODONE -acetaminophen , prochlorperazine , senna, and tamsulosin   History Review  Allergy: [name] is allergic to niaspan  [niacin ]. Drug: [name]  reports no history of drug use. Alcohol:  reports no history of alcohol use. Tobacco:  reports that he quit smoking about 33 years ago. His smoking use included cigarettes. He has never used smokeless tobacco. Social: [name]  reports that he quit smoking about 33 years ago. His smoking use included cigarettes. He has never used smokeless tobacco. He reports that he does not drink alcohol and does not use drugs. Medical:  has a past medical history of Allergy, Anxiety, BPH (benign prostatic hypertrophy), Cancer (HCC), Carotid artery occlusion, CHF (congestive heart failure) (HCC), Diabetes mellitus, ED (erectile dysfunction), GERD (gastroesophageal reflux disease), Hemorrhagic stroke (HCC) (06/2021), Hyperlipidemia, Hypertension, Lung cancer (HCC), Neoplasm related pain, 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), and Stroke (HCC). Surgical: [name]  has a past surgical history that includes Melanoma excision (2008); Tonsillectomy; Radiology with anesthesia (N/A, 04/05/2020); Video bronchoscopy with endobronchial ultrasound (N/A, 06/17/2021); Bronchial needle aspiration biopsy (06/17/2021); IR IMAGING GUIDED PORT INSERTION (08/05/2021); Radiology with anesthesia (N/A, 08/22/2021); Radiology with anesthesia (N/A, 12/17/2021); Radiology with anesthesia (N/A, 04/08/2022); and Anterior cervical decomp/discectomy fusion (N/A,  02/09/2023). Family: family history includes COPD in his mother; Clotting disorder in his maternal grandmother and mother; Heart disease in his brother and father.  Laboratory Chemistry Profile   Renal Lab Results  Component Value Date   BUN 21 08/18/2023   CREATININE 0.77 08/18/2023   LABCREA 162 03/06/2016   BCR SEE NOTE: 10/09/2022   GFRAA >89 03/05/2016   GFRNONAA >60 08/18/2023    Hepatic Lab Results  Component Value Date   AST 17 08/18/2023   ALT 10 08/18/2023   ALBUMIN 2.9 (L) 08/18/2023   ALKPHOS 77 08/18/2023    Electrolytes Lab Results  Component Value Date   NA 133 (L) 08/18/2023   K 4.3 08/18/2023   CL 102 08/18/2023   CALCIUM  8.3 (L) 08/18/2023   MG 2.1 04/21/2023   PHOS 3.4 04/21/2023    Bone Lab Results  Component Value Date   VD25OH 35.27 06/21/2021    Inflammation (CRP: Acute Phase) (ESR: Chronic Phase) Lab Results  Component Value Date   ESRSEDRATE 77 (H) 10/09/2022   LATICACIDVEN 1.8 02/13/2023         Note: Above Lab results reviewed.  Recent Imaging Review  DG Abdomen 1 View CLINICAL DATA:  Constipation for 6 days.  EXAM: ABDOMEN - 1 VIEW  COMPARISON:  PET-CT 03/17/2023.  FINDINGS: The bowel gas pattern is nonobstructive with diffuse moderate fecal stasis and retained stool to the proximal sigmoid segment.  This is increased since 03/17/2023. No radio-opaque calculi or acute radiographic abnormality are seen.  Multifocal bone metastases in the spine and sclerotic metastatic deposit in the right iliac wing, are better demonstrated on CT.  There are newly noted electrodes superimposing to the left and right of the L4 vertebral body.  There is plastic tubing superimposing over right true pelvis. Scattered iliofemoral calcific arteriosclerosis.  IMPRESSION: 1. Nonobstructive bowel gas pattern with diffuse moderate fecal stasis and impacted stool to the proximal sigmoid segment. This is increased since 03/17/2023. 2. Multifocal  bone metastases in the spine and sclerotic metastatic deposit in the right iliac wing, better demonstrated on CT. 3. Newly noted electrodes superimposing to the left and right of the L4 vertebral body.  Electronically Signed   By: Denman Fischer M.D.   On: 07/29/2023 22:24 Note: Reviewed        Physical Exam  General appearance: Well nourished, well developed, and well hydrated. In no apparent acute distress Mental status: Alert, oriented x 3 (person, place, & time)       Respiratory: No evidence of acute respiratory distress Eyes: PERLA Vitals: Pulse 73   Temp 97.9 F (36.6 C)   Resp 16   Ht 5' 7 (1.702 m)   Wt 140 lb (63.5 kg)   SpO2 97%   BMI 21.93 kg/m  BMI: Estimated body mass index is 21.93 kg/m as calculated from the following:   Height as of this encounter: 5' 7 (1.702 m).   Weight as of this encounter: 140 lb (63.5 kg). Ideal: Ideal body weight: 66.1 kg (145 lb 11.6 oz)  Patient presents today in wheelchair with caregiver  Assessment   Diagnosis  1. Lumbar facet arthropathy   2. Lumbar spondylosis   3. Chronic pain syndrome        Plan of Care  Sprint peripheral nerve stimulator leads removed.  Tips intact.  Follow-up as needed.   No follow-ups on file.    Recent Visits Date Type Provider Dept  07/29/23 Procedure visit Cephus Collin, MD [facility]  07/20/23 Procedure visit Cephus Collin, MD [facility]  07/08/23 Procedure visit Cephus Collin, MD [facility]  06/03/23 Procedure visit Cephus Collin,  MD [facility]  Showing recent visits within past 90 days and meeting all other requirements Today's Visits Date Type Provider Dept  08/19/23 Procedure visit Cephus Collin, MD [facility]  Showing today's visits and meeting all other requirements Future Appointments Date Type Provider Dept  09/16/23 Appointment Cephus Collin, MD [facility]  Showing future appointments within next 90 days  and meeting all other requirements  I discussed the assessment and treatment plan with the patient. The patient was provided an opportunity to ask questions and all were answered. The patient agreed with the plan and demonstrated an understanding of the instructions.  Patient advised to call back or seek an in-person evaluation if the symptoms or condition worsens.  Duration of encounter: .  Total time on encounter, as per AMA guidelines included both the face-to-face and non-face-to-face time personally spent by the physician and/or other qualified health care professional(s) on the day of the encounter (includes time in activities that require the physician or other qualified health care professional and does not include time in activities normally performed by clinical staff). Physician's time may include the following activities when performed: Preparing to see the patient (e.g., pre-charting review of records, searching for previously ordered imaging, lab work, and nerve conduction tests) Review of prior analgesic pharmacotherapies. Reviewing PMP Interpreting ordered tests (e.g., lab work, imaging, nerve conduction tests) Performing post-procedure evaluations, including interpretation of diagnostic procedures Obtaining and/or reviewing separately obtained history Performing a medically appropriate examination and/or evaluation Counseling and educating the patient/family/caregiver Ordering medications, tests, or procedures Referring and communicating with other health care professionals (when not separately reported) Documenting clinical information in the electronic or other health record Independently interpreting results (not separately reported) and communicating results to the patient/ family/caregiver Care coordination (not separately reported)  Note by: Cephus Collin, MD (TTS and AI technology used. I apologize for any typographical errors that were not detected and  corrected.) Date: 08/19/2023; Time: 4:09 PM

## 2023-08-20 ENCOUNTER — Ambulatory Visit
Admission: RE | Admit: 2023-08-20 | Discharge: 2023-08-20 | Disposition: A | Discharge status: Home / Self Care | Source: Ambulatory Visit | Attending: Oncology

## 2023-08-20 ENCOUNTER — Telehealth: Payer: Self-pay | Admitting: Family Medicine

## 2023-08-20 ENCOUNTER — Other Ambulatory Visit: Payer: Self-pay | Admitting: Oncology

## 2023-08-20 ENCOUNTER — Other Ambulatory Visit: Payer: Self-pay | Admitting: Internal Medicine

## 2023-08-20 ENCOUNTER — Ambulatory Visit
Admission: RE | Admit: 2023-08-20 | Discharge: 2023-08-20 | Disposition: A | Discharge status: Home / Self Care | Source: Ambulatory Visit | Attending: Oncology | Admitting: Oncology

## 2023-08-20 ENCOUNTER — Telehealth: Payer: Self-pay

## 2023-08-20 ENCOUNTER — Encounter: Payer: Self-pay | Admitting: Oncology

## 2023-08-20 DIAGNOSIS — I619 Nontraumatic intracerebral hemorrhage, unspecified: Secondary | ICD-10-CM | POA: Diagnosis not present

## 2023-08-20 DIAGNOSIS — I614 Nontraumatic intracerebral hemorrhage in cerebellum: Secondary | ICD-10-CM | POA: Diagnosis not present

## 2023-08-20 DIAGNOSIS — G9389 Other specified disorders of brain: Secondary | ICD-10-CM | POA: Diagnosis not present

## 2023-08-20 DIAGNOSIS — R9089 Other abnormal findings on diagnostic imaging of central nervous system: Secondary | ICD-10-CM | POA: Diagnosis not present

## 2023-08-20 DIAGNOSIS — C3490 Malignant neoplasm of unspecified part of unspecified bronchus or lung: Secondary | ICD-10-CM | POA: Diagnosis not present

## 2023-08-20 DIAGNOSIS — I618 Other nontraumatic intracerebral hemorrhage: Secondary | ICD-10-CM | POA: Diagnosis not present

## 2023-08-20 MED ORDER — GADOBUTROL 1 MMOL/ML IV SOLN
6.0000 mL | Freq: Once | INTRAVENOUS | Status: AC | PRN
  Administered 2023-08-20: 6 mL via INTRAVENOUS

## 2023-08-20 NOTE — Telephone Encounter (Signed)
 Attempt to contact for follow-up from PNS lead pull. No answer and VM not set up.

## 2023-08-20 NOTE — Telephone Encounter (Signed)
 Copied from CRM [id number]. Topic: General - Other >> Aug 19, 2023  4:01 PM [employer] wrote: Reason for CRM: Hospice would like to know if Dr. Cheril Cork would be the attending Dr. For the patient   Callback #: [phone]

## 2023-08-21 ENCOUNTER — Inpatient Hospital Stay: Admitting: Internal Medicine

## 2023-08-21 ENCOUNTER — Ambulatory Visit: Payer: Self-pay

## 2023-08-21 ENCOUNTER — Encounter: Payer: Self-pay | Admitting: Internal Medicine

## 2023-08-21 ENCOUNTER — Other Ambulatory Visit: Payer: Self-pay | Admitting: Family Medicine

## 2023-08-21 ENCOUNTER — Telehealth: Admitting: Physician Assistant

## 2023-08-21 ENCOUNTER — Inpatient Hospital Stay

## 2023-08-21 ENCOUNTER — Telehealth: Payer: Self-pay | Admitting: *Deleted

## 2023-08-21 VITALS — BP 91/57 | HR 82 | Temp 97.0°F | Resp 17 | Wt 140.0 lb

## 2023-08-21 DIAGNOSIS — C3412 Malignant neoplasm of upper lobe, left bronchus or lung: Secondary | ICD-10-CM | POA: Diagnosis not present

## 2023-08-21 DIAGNOSIS — G40909 Epilepsy, unspecified, not intractable, without status epilepticus: Secondary | ICD-10-CM | POA: Diagnosis not present

## 2023-08-21 DIAGNOSIS — G729 Myopathy, unspecified: Secondary | ICD-10-CM

## 2023-08-21 DIAGNOSIS — I619 Nontraumatic intracerebral hemorrhage, unspecified: Secondary | ICD-10-CM | POA: Diagnosis not present

## 2023-08-21 LAB — CK: Total CK: 28 U/L — ABNORMAL LOW (ref 49–397)

## 2023-08-21 MED ORDER — PREDNISONE 50 MG PO TABS
50.0000 mg | ORAL_TABLET | Freq: Every day | ORAL | 0 refills | Status: DC
Start: 2023-08-21 — End: 2023-08-28

## 2023-08-21 MED ORDER — SULFAMETHOXAZOLE-TRIMETHOPRIM 800-160 MG PO TABS: 1.0000 | ORAL_TABLET | Freq: Two times a day (BID) | ORAL | 0 refills | Status: DC

## 2023-08-21 NOTE — Progress Notes (Signed)
 PCP already has treated patient today. Visit not needed.

## 2023-08-21 NOTE — Telephone Encounter (Signed)
 Spoke to Mr Morken to assess his symptoms. He is not able to transport to the ED due to being on hospice. Conferred w/ PCP he is willing to call in antibiotics and speak w/ pt to treat.

## 2023-08-21 NOTE — Telephone Encounter (Signed)
 We do not normally treat male UTIs through the Virtual Urgent Care department as all male UTIs are considered complicated and we do not treat any complicated UTI. These should always be seen in person.    We worry about prostate or kidney conditions.  The standard of care is to examine the abdomen and kidneys, and to do a urine and blood test to make sure that something more serious is not going on.    I would recommend for the patient to be advised it is best to be seen in person for a full examination.   Thanks,  Jenni Tyquasia Pant, PA-C [facility]

## 2023-08-21 NOTE — Progress Notes (Unsigned)
 [facility] [address]  [place], [place] [address] [phone]   Interval Evaluation  Date of Service: 08/21/23 Patient Name: Micheal Donovan Patient MRN: [id number] Patient DOB: 10/09/1950 Provider: Mamie Searles, MD  Identifying Statement:  Micheal Donovan is a 73 y.o. male with Seizure disorder [facility])  Intraparenchymal hemorrhage of brain [facility])   Primary Cancer:  Oncologic History: Oncology History Overview Note  Diagnosis: Stage IIB T2b N1 M0 adenocarcinoma of the LUL, poorly differentiated     Primary non-small cell carcinoma of upper lobe of left lung (HCC)  06/13/2021 Initial Diagnosis   Primary non-small cell carcinoma of upper lobe of left lung [facility]) -06/20/2021 - 06/27/2021, patient presented to [facility] due to progressive headache/dizziness/gait changes. CT head showed acute to subacute intraparenchymal hemorrhage involving the left cerebellum.  Surrounding low-density vasogenic edema.  Patient was transferred to [facility]. This was further evaluated by CT angiogram of the neck which showed bulky calcified plaques/stenosis of carotid artery, Followed by MRI brain. 06/12/2021, MRI of the brain showed no significant interval change in size of the left cerebellar intraparenchymal hematoma with unchanged regional mass effect and partial effacement of fourth ventricle but no upstream hydrocephalus. There is no discernible enhancement to suggest underlying mass lesion, though acute blood products could mask enhancement.   06/12/2021 a chest x-ray showed a 4.4 cm left middle lobe. 06/13/2021, CT chest with contrast showed a 4.3 x 3.6 x 3.3 cm lobular spiculated mass in the posterior left upper lobe with T3 to the lateral pleura and major fissure.  Metastatic left hilar lymphadenopathy.  Peripheral micronodularity posterior right costophrenic sulcus.  Aortic atherosclerosis. 06/14/2021, CT abdomen pelvis showed right lower lobe  pulmonary artery embolus.  No evidence of right heart strain.  No acute intra-abdominal or pelvic pathology.  Aortic atherosclerosis.  06/13/2021, patient underwent bronchoscopy with EBUS by Dr. Felipe Horton.  Biopsy from the fine-needle aspiration of station 11 mL showed malignant cells, consistent with poorly differentiated non-small cell carcinoma, consistent with adenocarcinoma.  Malignant cells are TTF-1 positive and negative for p40.  Negative for neuroendocrine markers.  Blood test and tissue sample were tested for Gardant 360  -PD-L1 TPS 97%, no actionable mutation on the blood testing. 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   07/17/2021 Cancer Staging   Staging form: Lung, AJCC 8th Edition - Clinical: Stage IV (cT2b, cN1, cM1) - Signed by [name], MD on 08/06/2021   08/06/2021 Imaging   I saw patient's PET scan after his visit with me on 08/06/21. PET scan was ordered by his previous oncologist group and did not come to my in basket.. Patient received cycle 1 carboplatin  Taxol  today.  He has started on radiation.  5/26/3 PET scan showed 3.8 cm hypermetabolic left upper lobe mass with hypermetabolic left hilar and infrahilar adenopathy.  There is approximately 8 scattered metastatic lesions in the skeleton. Mixed density photopenic lesion in the left cerebellum. characterized as hemorrhage on recent prior imaging workups.    08/16/2021 -  Chemotherapy   Patient is on Treatment Plan : LUNG Carboplatin  (5) + Pemetrexed  + Pembrolizumab  (200) D1 q21d Induction x 4 cycles / Maintenance Pemetrexed  + Pembrolizumab  (200) D1 q21d      08/22/2021 Imaging   MRI brain w wo contrast  Decrease in size of left cerebellar hematoma with resolution of edema. No evidence of underlying lesion. Smaller, more recent hemorrhage in the left cerebellar vermis with minimal edema. There is  minimal enhancement without definite evidence of underlying lesion.  New punctate focus of chronic blood products and  enhancement in the left frontal lobe. Additional new foci of chronic blood products in the posterior right putamen, right parietal subcortical white matter, and posterior right cerebellum. Unclear at this time if these represent foci of bland hemorrhage or early metastases.   Increase in size of right parietal osseous metastasis with minor extraosseous extension.     Imaging   PET scan showed 1. Interval response to therapy as evidenced by a small residual left upper lobe nodule with decreased hypermetabolism, no residual hypermetabolic adenopathy and decreased hypermetabolism associatedwith osseous metastases. 2. 6 mm posterior left upper lobe nodule, likely stable. Recommend attention on follow-up. 3. Aortic atherosclerosis (ICD10-I70.0). Coronary artery calcification.   12/17/2021 Imaging   MRI lumbar spine w wo contrast  1. No evidence of regional metastatic disease. 2. Late subacute superior endplate fracture at L2 and large superior endplate Schmorl's node at L5, unchanged since the PET scan of 10/29/2021. 3. L3-4: Shallow disc protrusion. Facet and ligamentous hypertrophy. Stenosis of both lateral recesses. Findings slightly worsened since 2022. 4. L4-5: Shallow disc protrusion. Facet and ligamentous hypertrophy. Small synovial cyst arising from the facet joint on the right. Stenosis of the lateral recesses right worse than left. Foraminal narrowing right worse than left. Findings have worsened since 2022.  5. L5-S1: Endplate osteophytes and shallow protrusion of the disc. Facet and ligamentous hypertrophy. Stenosis of the subarticular lateral recesses and neural foramina, right worse than left. Similar appearance to the study of 2022. 6. Continued evidence of enteritis of at least 1 loop of small bowel. Distended bladder present   12/18/2021 Imaging   Brian MRI w wo  1. Interval decrease in size of the left cerebellar and vermian hematomas. No definite evidence of underlying metastatic  lesion. 2. There is are two possible contrast enhancing lesions in the posterior left frontal lobe and left occipital lobe, which do not have intrinsic T1 signal abnormality, but have a somewhat linear appearance and may be vascular in nature. Recommend attention on follow up. 3. Multifocal sites of susceptibility artifact are redemonstrated with interval development of a few new foci, as described above. All of these sites demonstrate intrinsic T1 hyperintense signal abnormality and susceptibility artifact and are compatible with sites of microhemorrhages. Recommend continued attention on follow up. 4. Interval decrease in size of right parietal calvarial metastatic lesion. No new contrast enhancing lesions visualized. 5. Diffusely heterogeneous marrow signal throughout the cervical spine, which is nonspecific but can be seen in the setting of anemia, smoking, obesity, or a marrow replacement process.     12/18/2021 Imaging   MRI lumbar spine w wo contrast  1. No evidence of regional metastatic disease. 2. Late subacute superior endplate fracture at L2 and large superior endplate Schmorl's node at L5, unchanged since the PET scan of 10/29/2021 3. L3-4: Shallow disc protrusion. Facet and ligamentous hypertrophy.Stenosis of both lateral recesses. Findings slightly worsened since 2022. 4. L4-5: Shallow disc protrusion. Facet and ligamentous hypertrophy. Small synovial cyst arising from the facet joint on the right. Stenosis of the lateral recesses right worse than left. Foraminal narrowing right worse than left. Findings have worsened since 2022. 5. L5-S1: Endplate osteophytes and shallow protrusion of the disc. Facet and ligamentous hypertrophy. Stenosis of the subarticular lateral recesses and neural foramina, right worse than left. Similar appearance to the study of 2022. 6. Continued evidence of enteritis of at least 1 loop of small bowel.  Distended bladder present.       01/28/2022 Imaging    CT chest abdomen pelvis w contrast 1. Treated left upper lobe mass appears grossly stable to the prior examination when measured in a similar fashion on the prior study. No definite signs of extra skeletal metastatic disease noted in the chest, abdomen or pelvis. 2. Widespread skeletal metastases redemonstrated, as above. 3. Hepatic steatosis. 4. Aortic atherosclerosis, in addition to left main and three-vessel coronary artery disease. Assessment for potential risk factor modification, dietary therapy or pharmacologic therapy may be warranted, if clinically indicated. 5. Additional incidental findings,    04/08/2022 Imaging   MRI lumbar spine w wo contrast  1. Stable remote superior endplate fracture of L2 and large Schmorl to noted involving L5. No worrisome bone lesions to suggest metastatic disease. 2. Degenerative lumbar spondylosis with multilevel disc disease and facet disease which appears relatively stable as detailed above. 3. Enlarging right-sided synovial cyst at L4-5 with progressive mass effect on the right side of the thecal sac. This could potentially be symptomatic   04/28/2022 Imaging   CT chest abdomen pelvis wo contrast  1. Similar versus slightly decreased size of treated left upper lobe primary bronchogenic carcinoma. 2. Similar osseous metastasis. 3. No evidence of extraosseous metastatic disease. 4. New small right pleural effusion. 5. Coronary artery atherosclerosis. Aortic Atherosclerosis   07/03/2022 Imaging   PET scan showed 1. Interval development of a small focus of intense hypermetabolism within the left upper lobe scar. This is associated with a 5 mm perifissural nodule in the left lung which is hypermetabolic. Hypermetabolism at both of these locations is new since prior PET-CT and highly suspicious for recurrent disease. 2. No evidence for hypermetabolic soft tissue metastatic disease in the neck, abdomen, or pelvis. 3. Similar appearance of sclerotic and  lytic bone lesions without hypermetabolism on PET imaging. 4. Focal hypermetabolism associated with a fracture of the right L4 transverse process. The fracture was visible on the previous CT of 04/28/2022. No associated soft tissue lesion to suggest that this is pathologic in nature. Tracer accumulation on today's study is in keeping with healing fracture. There is also some minimal uptake in a prominent spur associated with the L4 superior endplate, most likely degenerative. 5.  Aortic Atherosclerosis    08/01/2022 - 08/14/2022 Radiation Therapy   Radiation to left lung.    10/01/2022 Imaging   chest angiogram PE protocol  No pulmonary embolism identified.   Stable nodular opacity in the left upper lobe. Please correlate for history of treated lung cancer. There is an adjacent small nodule along the course of the interlobar fissure which is slightly larger today compared to the study of February 2024. please correlate with findings of the recent PET-CT of 07/03/2022.      10/02/2022 Imaging   MRI brain with and without contrast 1. No acute intracranial abnormality. No evidence for intraparenchymal metastatic disease. 2. Chronic left cerebellar hemorrhage with multiple additional chronic micro hemorrhages elsewhere throughout the brain, stable. 3. Underlying mild chronic microvascular ischemic disease.    10/02/2022 Imaging   MRI lumbar spine showed 1. No MRI evidence for acute infection or other abnormality within the lumbar spine. 2. Few subcentimeter T1 hypointense lesions about the visualized posterior iliac wings. These correspond with small sclerotic foci seen on most recent CT from 04/08/2022, presumably reflecting patient's known osseous metastatic disease. These were not hypermetabolic on prior PET-CT from 16/12/9602. 3. No other evidence for metastatic disease within the lumbar spine itself. 4.  Chronic L2 compression fracture, stable. Large Schmorl's node deformity with  associated height loss at L5, also stable. 5. Multifactorial degenerative changes at L3-4 through L5-S1 with resultant mild to moderate bilateral subarticular stenosis, with mild to moderate bilateral L3 through L5 foraminal narrowing as above.   12/24/2022 Imaging   CT chest w contrast   1. Stable appearance of treated lung lesion within the left upper lobe with surrounding scarring and architectural distortion. 2. The previous FDG avid subpleural nodule abutting the oblique fissure of the left lung (adjacent to post treatment changes) is again noted measuring 7 mm. This is stable from 10/01/2022. On the PET-CT from 07/03/2022 this nodule was measured at 5 mm. 3. New bandlike area of subpleural consolidation within the lateral and posterior left lower lobe is identified. This is favored to represent post treatment change. 4. Stable sclerotic lesions involving the T12 and L1 vertebra. 5. Coronary artery calcifications. 6.  Aortic Atherosclerosis    03/06/2023 Imaging   CT chest abdomen pelvis wo contrast showed  Increasing opacity along the lingula with the increasing nodular central component compared to the recent CT scan.   There is also some changing opacities in the left lower lobe. The larger more peripheral area in the left lower lobe is decreasing with some new areas of subtle ground-glass more caudal. Attention on follow-up.   No developing new other mass lesion or nodal enlargement.   Scattered sclerotic bone lesions has a similar distribution to previous.   Overall evaluation for solid organ pathology including solid organ metastases are limited without the advantage of IV contrast.     03/23/2023 Imaging   PET scan showed 1. Post radiation consolidation in the lingula with mild residual focal hypermetabolism, overall improved from 07/03/2022. No evidence of disease progression. 2. Quiescent osseous metastatic disease. 3. Aortic atherosclerosis (ICD10-I70.0).  Coronary artery calcification.     Interval History: Micheal Donovan presents today for clinical evaluation.  He describes a 1 month history of progressive and worsening leg weakness.  The left leg is more affected than the right.  He has more difficulty with his hip and thigh than with his lower leg or ankle, but the weakness is still all over.  He had weakness prior to this from stroke, neuropathy and disc disease but this is a clear and progressive change.  No numbness new from prior from pre-existing neuropathy.  Keytruda  has been held. No further seizures.    Prior- He had presented with acute on chronic left side (mainly leg) weakness, accompanied by episodes of aphasa and staring.  Stroke workup was negative, he was treated for suspected seizures with Keppra  and discharged with physical therapy.  Remains with imbalance and poor coordination of left leg as prior, worsened lately by pain in his hip and lower back.  Now unable to use a walker and is requiring wheelchair at home.  No seizure events since starting the Keppra .  Continues on Keytruda  with Dr Wilhelmenia Harada.  H+P (02/21/22) Patient presents for evaluation today for stroke history.  He presented initially in April 2023 with episode of sudden onset dizziness, inability to walk.  Following workup, he returned gradually to baseline functional/gait status after 2 months of intense rehab.  Lung cancer was also diagnosed in this time period, in addition to pulmonary embolism.  He was discharged on low dose Eliquis , but his has been discontinued by Dr. Wilhelmenia Harada given ongoing CNS blood products.  Aside from recent issues with lower legs, swelling and rash, his  gait and functional status have been independent.  He is not taking aspirin , statin, or blood pressure medications currently.  Medications: Current Outpatient Medications on File Prior to Visit  Medication Sig Dispense Refill   ALPRAZolam  (XANAX ) 0.5 MG tablet TAKE 1 TABLET(0.5 MG) BY MOUTH AT BEDTIME AS  NEEDED FOR ANXIETY 15 tablet 0   citalopram  (CELEXA ) 10 MG tablet TAKE 1 TABLET(10 MG) BY MOUTH DAILY 30 tablet 3   docusate sodium  (COLACE) 100 MG capsule Take 100 mg by mouth 2 (two) times daily as needed for mild constipation.     fentaNYL  (DURAGESIC ) 75 MCG/HR Place 1 patch onto the skin every 3 (three) days. 10 patch 0   gabapentin  (NEURONTIN ) 300 MG capsule TAKE 2 CAPSULES(600 MG) BY MOUTH TWICE DAILY 120 capsule 2   insulin  glargine (LANTUS ) 100 UNIT/ML injection Inject 0-30 Units into the skin daily as needed (High Blood Sugar over 150).     Insulin  Pen Needle (B-D UF III MINI PEN NEEDLES) 31G X 5 MM MISC Use to inject insulin  daily. 100 each 3   lactulose  (CHRONULAC ) 10 GM/15ML solution Take 15 mLs (10 g total) by mouth 3 (three) times daily as needed. 236 mL 0   levETIRAcetam  (KEPPRA ) 500 MG tablet Take 1 tablet (500 mg total) by mouth 2 (two) times daily. 180 tablet 3   megestrol  (MEGACE ) 40 MG tablet Take 1 tablet (40 mg total) by mouth daily. 30 tablet 3   meloxicam  (MOBIC ) 7.5 MG tablet Take 1 tablet (7.5 mg total) by mouth daily. 30 tablet 3   methocarbamol  (ROBAXIN ) 500 MG tablet Take 1 tablet (500 mg total) by mouth every 6 (six) hours as needed for muscle spasms. 120 tablet 0   NOVOLOG  FLEXPEN 100 UNIT/ML FlexPen Inject 0-10 Units into the skin daily as needed for high blood sugar (BG > 200).     Nystatin  (GERHARDT'S BUTT CREAM) CREA Apply 14 Applications topically 2 (two) times daily. 1 each 0   omeprazole  (PRILOSEC) 20 MG capsule Take 20 mg by mouth daily.     oxyCODONE -acetaminophen  (PERCOCET) 7.5-325 MG tablet Take 1 tablet by mouth every 4 (four) hours as needed for severe pain (pain score 7-10). Do not take with cough medication or other pain medication 60 tablet 0   senna (SENOKOT) 8.6 MG TABS tablet Take 1 tablet (8.6 mg total) by mouth 2 (two) times daily as needed for mild constipation. 30 tablet 0   tamsulosin  (FLOMAX ) 0.4 MG CAPS capsule TAKE 1 CAPSULE(0.4 MG) BY MOUTH  DAILY 30 capsule 2   [DISCONTINUED] prochlorperazine  (COMPAZINE ) 10 MG tablet Take 1 tablet (10 mg total) by mouth every 6 (six) hours as needed (Nausea or vomiting). 30 tablet 1   Current Facility-Administered Medications on File Prior to Visit  Medication Dose Route Frequency Provider Last Rate Last Admin   heparin  lock flush 100 UNIT/ML injection            sodium chloride  flush (NS) 0.9 % injection 10 mL  10 mL Intracatheter PRN [name], MD   10 mL at 07/07/23 1434    Allergies:  Allergies  Allergen Reactions   Niaspan  [Niacin ]     FLUSHING   Past Medical History:  Past Medical History:  Diagnosis Date   Allergy    Anxiety    BPH (benign prostatic hypertrophy)    Cancer (HCC)    Melanoma on Neck    2008   Carotid artery occlusion    CHF (congestive heart failure) (HCC)  Diabetes mellitus    type 2   ED (erectile dysfunction)    GERD (gastroesophageal reflux disease)    Hemorrhagic stroke (HCC) 06/2021   Hyperlipidemia    Hypertension    Lung cancer (HCC)    Neoplasm related pain    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)    Stroke [facility])    Past Surgical History:  Past Surgical History:  Procedure Laterality Date   ANTERIOR CERVICAL DECOMP/DISCECTOMY FUSION N/A 02/09/2023   Procedure: C3-5 ANTERIOR CERVICAL DISCECTOMY AND FUSION;  Surgeon: Jodeen Munch, MD;  Location: [facility] ORS;  Service: Neurosurgery;  Laterality: N/A;   BRONCHIAL NEEDLE ASPIRATION BIOPSY  06/17/2021   Procedure: BRONCHIAL NEEDLE ASPIRATION BIOPSIES;  Surgeon: Josiah Nigh, MD;  Location: [facility] ENDOSCOPY;  Service: Pulmonary;;   IR IMAGING GUIDED PORT INSERTION  08/05/2021   MELANOMA EXCISION  2008   Left side of neck   RADIOLOGY WITH ANESTHESIA N/A 04/05/2020   Procedure: MRI SPINE WITOUT CONTRAST;  Surgeon: Radiologist, Medication, MD;  Location: [facility] OR;  Service: Radiology;  Laterality: N/A;   RADIOLOGY WITH ANESTHESIA N/A 08/22/2021   Procedure: MRI BRAIN WITH AND WITHOUT CONTRAST  WITH  ANESTHESIA;  Surgeon: Radiologist, Medication, MD;  Location: [facility] OR;  Service: Radiology;  Laterality: N/A;   RADIOLOGY WITH ANESTHESIA N/A 12/17/2021   Procedure: MRI BRAIN WITH AND WITHOUT CONTRAST WITH ANESTHESIA; MRI LUMBER WITH AND WITHOUT CONTRAST;  Surgeon: Radiologist, Medication, MD;  Location: [facility] OR;  Service: Radiology;  Laterality: N/A;   RADIOLOGY WITH ANESTHESIA N/A 04/08/2022   Procedure: MRI LUMBER SPINE WITH AND WITHOUT CONTRAST;  Surgeon: Radiologist, Medication, MD;  Location: [facility] OR;  Service: Radiology;  Laterality: N/A;   TONSILLECTOMY     VIDEO BRONCHOSCOPY WITH ENDOBRONCHIAL ULTRASOUND N/A 06/17/2021   Procedure: VIDEO BRONCHOSCOPY WITH ENDOBRONCHIAL ULTRASOUND;  Surgeon: Josiah Nigh, MD;  Location: [facility] ENDOSCOPY;  Service: Pulmonary;  Laterality: N/A;   Social History:  Social History   Socioeconomic History   Marital status: Married    Spouse name: Not on file   Number of children: Not on file   Years of education: Not on file   Highest education level: Associate degree: occupational, Scientist, product/process development, or vocational program  Occupational History   Not on file  Tobacco Use   Smoking status: Former    Current packs/day: 0.00    Types: Cigarettes    Quit date: 07/21/1990    Years since quitting: 33.1   Smokeless tobacco: Never  Vaping Use   Vaping status: Never Used  Substance and Sexual Activity   Alcohol use: No   Drug use: No   Sexual activity: Not Currently  Other Topics Concern   Not on file  Social History Narrative   Not on file   Social Drivers of Health   Financial Resource Strain: Low Risk  (05/27/2023)   Overall Financial Resource Strain (CARDIA)    Difficulty of Paying Living Expenses: Not very hard  Food Insecurity: No Food Insecurity (05/27/2023)   Hunger Vital Sign    Worried About Running Out of Food in the Last Year: Never true    Ran Out of Food in the Last Year: Never true  Transportation Needs: No Transportation Needs (05/27/2023)    PRAPARE - Administrator, Civil Service (Medical): No    Lack of Transportation (Non-Medical): No  Physical Activity: Unknown (05/27/2023)   Exercise Vital Sign    Days of Exercise per Week: 0 days    Minutes of Exercise  per Session: Not on file  Stress: Stress Concern Present (05/27/2023)   [employer] of Occupational Health - Occupational Stress Questionnaire    Feeling of Stress : To some extent  Social Connections: Unknown (05/27/2023)   Social Connection and Isolation Panel    Frequency of Communication with Friends and Family: Once a week    Frequency of Social Gatherings with Friends and Family: Patient declined    Attends Religious Services: Patient unable to answer    Active Member of Clubs or Organizations: No    Attends Banker Meetings: Patient unable to answer    Marital Status: Married  Catering manager Violence: Not At Risk (04/22/2023)   Humiliation, Afraid, Rape, and Kick questionnaire    Fear of Current or Ex-Partner: No    Emotionally Abused: No    Physically Abused: No    Sexually Abused: No   Family History:  Family History  Problem Relation Age of Onset   COPD Mother    Clotting disorder Mother    Heart disease Father    Heart disease Brother        MI at age 56   Clotting disorder Maternal Grandmother    Stroke Neg Hx     Review of Systems: Constitutional: Doesn't report fevers, chills or abnormal weight loss Eyes: Doesn't report blurriness of vision Ears, nose, mouth, throat, and face: Doesn't report sore throat Respiratory: Doesn't report cough, dyspnea or wheezes Cardiovascular: Doesn't report palpitation, chest discomfort  Gastrointestinal:  Doesn't report nausea, constipation, diarrhea GU: Doesn't report incontinence Skin: Doesn't report skin rashes Neurological: Per HPI Musculoskeletal: Doesn't report joint pain Behavioral/Psych: Doesn't report anxiety  Physical Exam: Vitals:   08/21/23 1136  BP: (!) 91/57   Pulse: (!) 146  Resp: 17  Temp: (!) 97 F (36.1 C)  SpO2: 97%     KPS: 60. General: Alert, cooperative, pleasant, in no acute distress Head: Normal EENT: No conjunctival injection or scleral icterus.  Lungs: Resp effort normal Cardiac: Regular rate Abdomen: Non-distended abdomen Skin: Erythema lower legs Extremities: No clubbing or edema  Neurologic Exam: Mental Status: Awake, alert, attentive to examiner. Oriented to self and environment. Language is fluent with intact comprehension.  Cranial Nerves: Visual acuity is grossly normal. Visual fields are full. Extra-ocular movements intact. No ptosis. Face is symmetric Motor: Atrophy in both hip girdles. Power is 2/5 in left leg proximally and 3/5 distally, 3/5 in right leg. Reflexes are 1+ in left leg, 2+ in right, no pathologic reflexes present.  Sensory: Grossly intact to light touch Gait: Non ambulatory   Labs: I have reviewed the data as listed    Component Value Date/Time   NA 133 (L) 08/18/2023 1252   K 4.3 08/18/2023 1252   CL 102 08/18/2023 1252   CO2 24 08/18/2023 1252   GLUCOSE 117 (H) 08/18/2023 1252   BUN 21 08/18/2023 1252   CREATININE 0.77 08/18/2023 1252   CREATININE 1.11 10/09/2022 1500   CALCIUM  8.3 (L) 08/18/2023 1252   PROT 6.6 08/18/2023 1252   ALBUMIN 2.9 (L) 08/18/2023 1252   AST 17 08/18/2023 1252   AST 18 07/17/2021 1345   ALT 10 08/18/2023 1252   ALT 20 07/17/2021 1345   ALKPHOS 77 08/18/2023 1252   BILITOT 0.7 08/18/2023 1252   BILITOT 0.6 07/17/2021 1345   GFRNONAA >60 08/18/2023 1252   GFRNONAA >60 07/17/2021 1345   GFRNONAA 88 03/05/2016 0824   GFRAA >89 03/05/2016 0824   Lab Results  Component Value Date  WBC 8.2 08/18/2023   NEUTROABS 4.9 08/18/2023   HGB 11.1 (L) 08/18/2023   HCT 35.0 (L) 08/18/2023   MCV 89.7 08/18/2023   PLT 298 08/18/2023   Imaging:  [facility] Clinician Interpretation: I have personally reviewed the CNS images as listed.  My interpretation, in the context  of the patient's clinical presentation, is stable disease pending official read  DG Abd 1 View Result Date: 08/20/2023 CLINICAL DATA:  Intraparenchymal hemorrhage, spinal cord lead EXAM: ABDOMEN - 1 VIEW COMPARISON:  07/29/2023, 08/19/2023 FINDINGS: 2 supine frontal views of the abdomen and pelvis are obtained. No bowel obstruction or ileus. No masses or abnormal calcifications. There are no unexpected radiopaque foreign bodies. Specifically, the lower lumbar spine electrode seen on the prior x-ray have been removed in the interim. There are no retained wire fragments identified on this exam, and none were seen on the CT performed yesterday. Excreted contrast within the urinary bladder. IMPRESSION: 1. Unremarkable bowel gas pattern. 2. Interval removal of the spinal electrode seen on prior x-ray 07/29/2023. No unexpected radiopaque foreign bodies are identified. Electronically Signed   By: Bobbye Burrow M.D.   On: 08/20/2023 15:20   CT CHEST ABDOMEN PELVIS W CONTRAST Result Date: 08/19/2023 CLINICAL DATA:  Metastatic lung cancer restaging * Tracking Code: BO * EXAM: CT CHEST, ABDOMEN, AND PELVIS WITH CONTRAST TECHNIQUE: Multidetector CT imaging of the chest, abdomen and pelvis was performed following the standard protocol during bolus administration of intravenous contrast. RADIATION DOSE REDUCTION: This exam was performed according to the departmental dose-optimization program which includes automated exposure control, adjustment of the mA and/or kV according to patient size and/or use of iterative reconstruction technique. CONTRAST:  OMNIPAQUE  IOHEXOL  300 MG/ML  SOLN COMPARISON:  PET-CT, 03/17/2023 FINDINGS: CT CHEST FINDINGS Cardiovascular: Right chest port catheter. Aortic atherosclerosis. Normal heart size. Three-vessel coronary artery calcifications. No pericardial effusion. Mediastinum/Nodes: No enlarged mediastinal, hilar, or axillary lymph nodes. Thyroid  gland, trachea, and esophagus  demonstrate no significant findings. Lungs/Pleura: Unchanged perihilar mass in the left upper lobe measuring 3.6 x 2.4 cm with adjacent bandlike radiation fibrosis (series 4, image 64). Additional small bilateral pulmonary nodules not significantly changed, for example a 0.4 cm nodule of the medial right upper lobe (series 4, image 44). New trace pleural effusions. Musculoskeletal: No chest wall abnormality. No acute osseous findings. CT ABDOMEN PELVIS FINDINGS Hepatobiliary: No solid liver abnormality is seen. No gallstones, gallbladder wall thickening, or biliary dilatation. Pancreas: Unremarkable. No pancreatic ductal dilatation or surrounding inflammatory changes. Spleen: Normal in size without significant abnormality. Adrenals/Urinary Tract: Adrenal glands are unremarkable. Kidneys are normal, without renal calculi, solid lesion, or hydronephrosis. Bladder is unremarkable. Stomach/Bowel: Stomach is within normal limits. Appendix appears normal. No evidence of bowel wall thickening, distention, or inflammatory changes. Moderate burden of stool and stool balls throughout the colon and rectum. Vascular/Lymphatic: Aortic atherosclerosis. No enlarged abdominal or pelvic lymph nodes. Reproductive: No mass or other abnormality. Other: No abdominal wall hernia or abnormality. No ascites. Musculoskeletal: No acute osseous findings. Unchanged occasional sclerotic metastases, for example in the right ilium (series 2, image 98) and superior endplate of L4 (series 2, image 86). Unchanged mild superior endplate wedge deformity of L2. IMPRESSION: 1. Unchanged treated perihilar mass in the left upper lobe measuring 3.6 x 2.4 cm with adjacent bandlike radiation fibrosis. 2. Additional small bilateral pulmonary nodules not significantly changed. Attention on follow-up. 3. Unchanged occasional sclerotic osseous metastases. 4. No evidence of lymphadenopathy or soft tissue metastatic disease in the chest, abdomen, or  pelvis. 5. New  trace pleural effusions, nonspecific. 6. Coronary artery disease. Aortic Atherosclerosis (ICD10-I70.0). Electronically Signed   By: Fredricka Jenny M.D.   On: 08/19/2023 21:47   DG Abdomen 1 View Result Date: 07/29/2023 CLINICAL DATA:  Constipation for 6 days. EXAM: ABDOMEN - 1 VIEW COMPARISON:  PET-CT 03/17/2023. FINDINGS: The bowel gas pattern is nonobstructive with diffuse moderate fecal stasis and retained stool to the proximal sigmoid segment. This is increased since 03/17/2023. No radio-opaque calculi or acute radiographic abnormality are seen. Multifocal bone metastases in the spine and sclerotic metastatic deposit in the right iliac wing, are better demonstrated on CT. There are newly noted electrodes superimposing to the left and right of the L4 vertebral body. There is plastic tubing superimposing over right true pelvis. Scattered iliofemoral calcific arteriosclerosis. IMPRESSION: 1. Nonobstructive bowel gas pattern with diffuse moderate fecal stasis and impacted stool to the proximal sigmoid segment. This is increased since 03/17/2023. 2. Multifocal bone metastases in the spine and sclerotic metastatic deposit in the right iliac wing, better demonstrated on CT. 3. Newly noted electrodes superimposing to the left and right of the L4 vertebral body. Electronically Signed   By: Denman Fischer M.D.   On: 07/29/2023 22:24    Assessment/Plan Seizure disorder [facility])  Intraparenchymal hemorrhage of brain (HCC)  Everlina Hock presents with clinical syndrome of progressive bilateral leg weakness.  Symptoms are proximal greater than distal and left greater than right.  He does have pre-existing left sided weakness from stroke, spondylosis, neuropathy.  Today more marked atrophy and weakness are noted on exam, in pattern that may localize best to muscle based on pattern of reflexes.  Consideration should be given myositis secondary to pembrolizumab : Neurology. 2018 Sep 4;91(10):[id number].  MRI brain is  pending read but appears normal per our read.  Recommended the following: -Continue to hold keytruda , per Dr. Wilhelmenia Harada -Trial of prednisone  50mg  daily x7 days -Check EMG/NCV -CK and aldolase in serum  We will touch base with him in 1 week via phone to assess response to steroids, lab results, further workup.  He should continue Keppra  500mg  BID.  For stroke prevention, recommended continuing Aspirin  81mg  daily and Atorvastatin  40mg  daily.  He will con't managing diabetes with insulin , diet.  We do not recommend resuming anticoagulation given recurrent hemorrhagic foci.  We appreciate the opportunity to participate in the care of Micheal Donovan.   We ask that Everlina Hock return to clinic in 1 week or sooner if needed.  All questions were answered. The patient knows to call the clinic with any problems, questions or concerns. No barriers to learning were detected.  The total time spent in the encounter was 40 minutes and more than 50% was on counseling and review of test results   Mamie Searles, MD Medical Director of Neuro-Oncology [facility] at [facility] 08/21/23 11:39 AM

## 2023-08-21 NOTE — Telephone Encounter (Signed)
 The wife called and said that Dr. Wilhelmenia Harada on Tuesday recommended hospice and they did meet with the hospice rep.  She is just not sure if that is the right way to go and she wanted to have a conversation with Josh.  Josh said that he would call her.

## 2023-08-21 NOTE — Telephone Encounter (Signed)
 I spoke with patient's wife.  She described overall pattern of decline and progressive weakness.  She has met with hospice but also spoke with Dr. Mark Sil today about further workup.  She plans to speak with Dr. Mark Sil next week prior to making a decision on whether or not to initiate hospice services in the home.

## 2023-08-21 NOTE — Telephone Encounter (Signed)
 Copied from CRM [id number]. Topic: Clinical - Red Word Triage >> Aug 21, 2023  1:35 PM Marissa P wrote: Red Word that prompted transfer to Nurse Triage: Patient has uti, has pain when urinating and all the other symptoms as well.   Reason for Disposition  Diabetes mellitus or weak immune system (e.g., HIV positive, cancer chemo, splenectomy, organ transplant, chronic steroids)  Answer Assessment - Initial Assessment Questions 1. SEVERITY: How bad is the pain?  (e.g., Scale 1-10; mild, moderate, or severe)   - MILD (1-3): Complains slightly about urination hurting.   - MODERATE (4-7): Interferes with normal activities.     - SEVERE (8-10): Excruciating, unwilling or unable to urinate because of the pain.      9/10 2. FREQUENCY: How many times have you had painful urination today?      3 times  3. PATTERN: Is pain present every time you urinate or just sometimes?      Every urination  4. ONSET: When did the painful urination start?      1 week ago  5. FEVER: Do you have a fever? If Yes, ask: What is your temperature, how was it measured, and when did it start?     No 6. PAST UTI: Have you had a urine infection before? If Yes, ask: When was the last time? and What happened that time?      Yes, similar, required antibiotics  7. CAUSE: What do you think is causing the painful urination?      UTI 8. OTHER SYMPTOMS: Do you have any other symptoms? (e.g., flank pain, penis discharge, scrotal pain, blood in urine)     Mucous in urine, urine is dark yellow/oarange  Protocols used: Urination Pain - Male-A-AH   FYI Only or Action Required?: FYI only for provider.  Patient was last seen in primary care on 05/27/2023 by Amadeo June, MD. Called Nurse Triage reporting Dysuria. Symptoms began a week ago. Interventions attempted: Nothing. Symptoms are: gradually worsening.  Triage Disposition: See HCP Within 4 Hours (Or PCP Triage)  Patient/caregiver understands and will  follow disposition?: Yes

## 2023-08-21 NOTE — Progress Notes (Unsigned)
 Concerns of worsening weakness today

## 2023-08-22 LAB — ALDOLASE: Aldolase: 5.9 U/L (ref 3.3–10.3)

## 2023-08-24 ENCOUNTER — Encounter: Payer: Self-pay | Admitting: Oncology

## 2023-08-24 ENCOUNTER — Telehealth: Payer: Self-pay

## 2023-08-24 NOTE — Telephone Encounter (Signed)
 Fax request sent to [facility] ordering a EMG per Dr. Buckley.

## 2023-08-25 ENCOUNTER — Other Ambulatory Visit: Payer: Self-pay | Admitting: Family Medicine

## 2023-08-25 ENCOUNTER — Telehealth: Payer: Self-pay | Admitting: Family Medicine

## 2023-08-25 DIAGNOSIS — C3412 Malignant neoplasm of upper lobe, left bronchus or lung: Secondary | ICD-10-CM

## 2023-08-25 DIAGNOSIS — C7951 Secondary malignant neoplasm of bone: Secondary | ICD-10-CM

## 2023-08-25 NOTE — Telephone Encounter (Signed)
 Copied from CRM [id number]. Topic: Referral - Question >> Aug 24, 2023 11:42 AM Dawna HERO wrote: Reason for CRM: referral need from provider for patient in order to schedule , call back number [phone]

## 2023-08-26 ENCOUNTER — Telehealth: Payer: Self-pay

## 2023-08-26 NOTE — Telephone Encounter (Signed)
 PCP office calling for more info why referral was needed since pt is established with Dr. Babara.  I have reviewed the chart and not sure if the initial referral request message is from patient's wife or this office.    I do not see documentation for reason we need referral.  PCP office notified.

## 2023-08-27 ENCOUNTER — Encounter: Payer: Self-pay | Admitting: Family Medicine

## 2023-08-27 ENCOUNTER — Other Ambulatory Visit: Payer: Self-pay | Admitting: Family Medicine

## 2023-08-27 MED ORDER — SULFAMETHOXAZOLE-TRIMETHOPRIM 800-160 MG PO TABS: 1.0000 | ORAL_TABLET | Freq: Two times a day (BID) | ORAL | 0 refills | Status: DC

## 2023-08-28 ENCOUNTER — Inpatient Hospital Stay: Admitting: Internal Medicine

## 2023-08-28 ENCOUNTER — Telehealth: Payer: Self-pay

## 2023-08-28 DIAGNOSIS — I619 Nontraumatic intracerebral hemorrhage, unspecified: Secondary | ICD-10-CM | POA: Diagnosis not present

## 2023-08-28 DIAGNOSIS — R29898 Other symptoms and signs involving the musculoskeletal system: Secondary | ICD-10-CM

## 2023-08-28 MED ORDER — PREDNISONE 50 MG PO TABS: 50.0000 mg | ORAL_TABLET | Freq: Every day | ORAL | 0 refills | Status: DC

## 2023-08-28 NOTE — Progress Notes (Signed)
 I reached out to the pt and his wife to clarify the reason for the referral to [facility] at [facility] since the pt is already under the care of Dr Babara at [facility]. No answer at either number. VM left with pt's wife requesting a return call.

## 2023-08-28 NOTE — Progress Notes (Signed)
 I connected with Micheal Donovan on 08/28/23 at 11:45 AM EDT by telephone visit and verified that I am speaking with the correct person using two identifiers.  I discussed the limitations, risks, security and privacy concerns of performing an evaluation and management service by telemedicine and the availability of in-person appointments. I also discussed with the patient that there may be a patient responsible charge related to this service. The patient expressed understanding and agreed to proceed.   Other persons participating in the visit and their role in the encounter:  n/a   Patient's location:  Home Provider's location:  Office Chief Complaint:  Intraparenchymal hemorrhage of brain (HCC)  Weakness of left lower extremity  History of Present Ilness: Micheal Donovan reports clear improvement in his leg weakness since last week.  He was able to stand with PT assistance yesterday, which is new milestone.  No side effects or other issues.  He has EMG scheduled for later in August.  Observations: Language and cognition at baseline  Assessment and Plan: Intraparenchymal hemorrhage of brain (HCC)  Weakness of left lower extremity  Clinically improved.  Recommended continuing prednisone  50mg  daily for an additional 14 days.  Then we will and organize a taper for him, pending other issues or side effects.  Follow Up Instructions: RTC via phone in 2 weeks, then in person in August following EMG  I discussed the assessment and treatment plan with the patient.  The patient was provided an opportunity to ask questions and all were answered.  The patient agreed with the plan and demonstrated understanding of the instructions.    The patient was advised to call back or seek an in-person evaluation if the symptoms worsen or if the condition fails to improve as anticipated.    Maricela Kawahara K Gracee Ratterree, MD   I provided 20 minutes of non face-to-face telephone visit time during this encounter, and > 50% was  spent counseling as documented under my assessment & plan.

## 2023-08-28 NOTE — Telephone Encounter (Signed)
 [facility] requested signed physician orders . Orders completed by Dr. Duanne and faxed on 08/28/23 to Andrez Ley, Rn w/ [facility] at [phone]

## 2023-08-28 NOTE — Progress Notes (Signed)
 Pts wife, Rojelio, returned my call and states she was confused as to why Dr Duanne, the pt's PCM, had placed a referral to [facility]. Rojelio states that the pt currently isn't receiving treatment for his lung cancer.  I let her know I would reach out to Dr Duanne for answers and call her once I've gotten an answer.  I spoke to Dr. Duanne via [facility] who states the referral was a miscommunication and can be disregarded. I called Rojelio to let her know that the referral was a mistake, but let her know that he does start treatment again, his PCM can place a referral to Dr. Sherrod if he would like to receive his care here. [name] verbalized appreciation and understanding. I left my number in the event she has any questions or needs assistance

## 2023-09-01 ENCOUNTER — Ambulatory Visit: Payer: PPO

## 2023-09-08 ENCOUNTER — Other Ambulatory Visit

## 2023-09-08 ENCOUNTER — Ambulatory Visit: Admitting: Internal Medicine

## 2023-09-08 ENCOUNTER — Ambulatory Visit: Admitting: Oncology

## 2023-09-08 ENCOUNTER — Ambulatory Visit

## 2023-09-09 ENCOUNTER — Telehealth: Payer: Self-pay

## 2023-09-09 ENCOUNTER — Ambulatory Visit: Payer: PPO | Admitting: Radiation Oncology

## 2023-09-09 NOTE — Telephone Encounter (Signed)
 [facility] faxed in order from Dr. Buckley requesting a referral for an EMG. Order has been completed . Pt. Is scheduled EMG on 10/19/2023 Pt is not on a HMO so referral from PCP is not needed according to [facility] Representative .

## 2023-09-11 ENCOUNTER — Other Ambulatory Visit: Payer: Self-pay

## 2023-09-11 ENCOUNTER — Inpatient Hospital Stay: Attending: Radiation Oncology | Admitting: Internal Medicine

## 2023-09-11 ENCOUNTER — Ambulatory Visit: Payer: Self-pay

## 2023-09-11 DIAGNOSIS — R569 Unspecified convulsions: Secondary | ICD-10-CM

## 2023-09-11 DIAGNOSIS — C3412 Malignant neoplasm of upper lobe, left bronchus or lung: Secondary | ICD-10-CM | POA: Insufficient documentation

## 2023-09-11 DIAGNOSIS — R531 Weakness: Secondary | ICD-10-CM | POA: Insufficient documentation

## 2023-09-11 DIAGNOSIS — Z87891 Personal history of nicotine dependence: Secondary | ICD-10-CM | POA: Insufficient documentation

## 2023-09-11 DIAGNOSIS — Z86711 Personal history of pulmonary embolism: Secondary | ICD-10-CM | POA: Insufficient documentation

## 2023-09-11 DIAGNOSIS — C7931 Secondary malignant neoplasm of brain: Secondary | ICD-10-CM | POA: Insufficient documentation

## 2023-09-11 DIAGNOSIS — C7951 Secondary malignant neoplasm of bone: Secondary | ICD-10-CM | POA: Insufficient documentation

## 2023-09-11 DIAGNOSIS — R29898 Other symptoms and signs involving the musculoskeletal system: Secondary | ICD-10-CM | POA: Diagnosis not present

## 2023-09-11 MED ORDER — PREDNISONE 20 MG PO TABS: 20.0000 mg | ORAL_TABLET | Freq: Every day | ORAL | 0 refills | Status: DC

## 2023-09-11 MED ORDER — CEPHALEXIN 500 MG PO CAPS: 500.0000 mg | ORAL_CAPSULE | Freq: Three times a day (TID) | ORAL | 0 refills | Status: DC

## 2023-09-11 NOTE — Progress Notes (Signed)
 I connected with Micheal Donovan on 09/11/23 at 11:45 AM EDT by telephone visit and verified that I am speaking with the correct person using two identifiers.  I discussed the limitations, risks, security and privacy concerns of performing an evaluation and management service by telemedicine and the availability of in-person appointments. I also discussed with the patient that there may be a patient responsible charge related to this service. The patient expressed understanding and agreed to proceed.   Other persons participating in the visit and their role in the encounter:  n/a   Patient's location:  Home Provider's location:  Office Chief Complaint:  Weakness of left lower extremity  Seizure (HCC)  History of Present Ilness: Micheal Donovan reports no clear improvement in his leg weakness since continuing the prednisone  50mg  at prior visit.  He continues to work with PT as prior.  No side effects or other issues.  He has EMG scheduled for later in August.  Observations: Language and cognition at baseline  Assessment and Plan: Weakness of left lower extremity  Seizure (HCC)  Clinically improved.  Recommended reducing prednisone  to 20mg  daily at this time due to lack of further benefit.  He is agreeable with this.  Follow Up Instructions: RTC in person in August following EMG  I discussed the assessment and treatment plan with the patient.  The patient was provided an opportunity to ask questions and all were answered.  The patient agreed with the plan and demonstrated understanding of the instructions.    The patient was advised to call back or seek an in-person evaluation if the symptoms worsen or if the condition fails to improve as anticipated.    Traniya Prichett K Cannan Beeck, MD   I provided 20 minutes of non face-to-face telephone visit time during this encounter, and > 50% was spent counseling as documented under my assessment & plan.

## 2023-09-11 NOTE — Telephone Encounter (Signed)
 FYI Only or Action Required?: Action required by provider: clinical question for provider.  Patient was last seen in primary care on 05/27/2023 by Aletha Bene, MD.  Called Nurse Triage reporting Urinary Tract Infection.  Symptoms began yesterday.  Interventions attempted: Nothing.  Symptoms are: gradually worsening.  Triage Disposition: Call PCP Within 24 Hours  Patient/caregiver understands and will follow disposition?: UnsureCopied from CRM [id number]. Topic: Clinical - Red Word Triage >> Sep 11, 2023 12:04 PM Jayma L wrote: Red Word that prompted transfer to Nurse Triage: wife calling her name is Beverley, she states patient has a UTI and is having burning with urination, the color of urine is orange or red , last night there was some mucus in the urine as well, and this is getting worse since patient was last seen in the office. Asking to bring in a sample of the urine today for it to be tested. Reason for Disposition  [1] Taking medicine for urinary symptoms (e.g., incontinence, overactive bladder) AND [2] feels is having side effects (e.g., dry mouth, GI symptoms, difficulty urinating)  Answer Assessment - Initial Assessment Questions 1. SYMPTOM: What's the main symptom you're concerned about? (e.g., frequency, incontinence)     Burning  2. ONSET: When did the    start?     yesterday 3. PAIN: Is there any pain? If Yes, ask: How bad is it? (Scale: 1-10; mild, moderate, severe)     Just burning  4. CAUSE: What do you think is causing the symptoms?     Recurrent UTI 5. OTHER SYMPTOMS: Do you have any other symptoms? (e.g., blood in urine, fever, flank pain, pain with urination)     Urine is reddish-orange.     Dr Duanne called abx in 3 weeks ago for UTI and symptoms have returned.  Pt wears condom catheter and Beverley saw mucus-like/pus in tubing earlier today.No fever. Color is more orange that red. Beverley doesn't see blood at this time.  Pt is bed-bound and on hospice. Beverley is  asking if she can bring sample to office today. Please advise and call Sue's number. Pt won't answer his phone.  Protocols used: Urinary Symptoms-A-AH

## 2023-09-11 NOTE — Telephone Encounter (Signed)
 Spoke with Dr Duanne, he advised me to send in Keflex  500mg  TID for 7 days. I sent this to the pharmacy.

## 2023-09-14 ENCOUNTER — Inpatient Hospital Stay (HOSPITAL_BASED_OUTPATIENT_CLINIC_OR_DEPARTMENT_OTHER): Admitting: Hospice and Palliative Medicine

## 2023-09-14 ENCOUNTER — Encounter: Payer: Self-pay | Admitting: Oncology

## 2023-09-14 ENCOUNTER — Inpatient Hospital Stay: Admitting: Oncology

## 2023-09-14 VITALS — BP 111/62 | HR 61 | Temp 96.7°F | Resp 18 | Wt 127.1 lb

## 2023-09-14 DIAGNOSIS — C7931 Secondary malignant neoplasm of brain: Secondary | ICD-10-CM | POA: Diagnosis not present

## 2023-09-14 DIAGNOSIS — M6281 Muscle weakness (generalized): Secondary | ICD-10-CM

## 2023-09-14 DIAGNOSIS — Z87891 Personal history of nicotine dependence: Secondary | ICD-10-CM | POA: Diagnosis not present

## 2023-09-14 DIAGNOSIS — C7951 Secondary malignant neoplasm of bone: Secondary | ICD-10-CM | POA: Diagnosis not present

## 2023-09-14 DIAGNOSIS — R569 Unspecified convulsions: Secondary | ICD-10-CM

## 2023-09-14 DIAGNOSIS — C3412 Malignant neoplasm of upper lobe, left bronchus or lung: Secondary | ICD-10-CM | POA: Diagnosis present

## 2023-09-14 DIAGNOSIS — R531 Weakness: Secondary | ICD-10-CM | POA: Diagnosis not present

## 2023-09-14 DIAGNOSIS — Z515 Encounter for palliative care: Secondary | ICD-10-CM

## 2023-09-14 DIAGNOSIS — R29898 Other symptoms and signs involving the musculoskeletal system: Secondary | ICD-10-CM | POA: Diagnosis not present

## 2023-09-14 DIAGNOSIS — Z86711 Personal history of pulmonary embolism: Secondary | ICD-10-CM | POA: Diagnosis not present

## 2023-09-14 NOTE — Assessment & Plan Note (Signed)
 On Keppra.  Recommend patient to follow up with neurologist.

## 2023-09-14 NOTE — Progress Notes (Signed)
 Palliative Medicine [facility] at [facility] [phone]) [phone] [phone]   Name: Micheal Donovan Date: 09/14/2023 MRN: [id number]  DOB: 1950/03/26  Patient Care Team: Duanne Butler DASEN, MD as PCP - General (Family Medicine) Alvan, Ronal BRAVO, MD (Inactive) as PCP - Cardiology (Cardiology) Verdene Gills, RN as Oncology Nurse Navigator Babara Call, MD as Consulting Physician (Oncology) Alvan Ronal BRAVO, MD (Inactive) as Consulting Physician (Cardiology) Portia Fireman, OD (Optometry) Bonner Ade, MD as Consulting Physician (Physical Medicine and Rehabilitation) Babara Call, MD as Consulting Physician (Oncology) Lenn Aran, MD as Consulting Physician (Radiation Oncology)    REASON FOR CONSULTATION: Micheal Donovan is a 73 y.o. male with multiple medical problems including non-small cell lung cancer, history of subacute intraparenchymal hemorrhage, PE.  Patient is status post XRT.  He is on systemic chemo.  He is referred to palliative care to address goals and manage ongoing symptoms.  SOCIAL HISTORY:     reports that he quit smoking about 33 years ago. His smoking use included cigarettes. He has never used smokeless tobacco. He reports that he does not drink alcohol and does not use drugs.  Patient is married.  He has a daughter who is his primary caregiver.  Patient previously worked as an Lexicographer  ADVANCE DIRECTIVES:  On file  CODE STATUS:   PAST MEDICAL HISTORY: Past Medical History:  Diagnosis Date   Allergy    Anxiety    BPH (benign prostatic hypertrophy)    Cancer (HCC)    Melanoma on Neck    2008   Carotid artery occlusion    CHF (congestive heart failure) (HCC)    Diabetes mellitus    type 2   ED (erectile dysfunction)    GERD (gastroesophageal reflux disease)    Hemorrhagic stroke (HCC) 06/2021   Hyperlipidemia    Hypertension    Lung cancer (HCC)    Neoplasm related pain    Retinopathy due to secondary  DM (HCC)    Stroke [facility])     PAST SURGICAL HISTORY:  Past Surgical History:  Procedure Laterality Date   ANTERIOR CERVICAL DECOMP/DISCECTOMY FUSION N/A 02/09/2023   Procedure: C3-5 ANTERIOR CERVICAL DISCECTOMY AND FUSION;  Surgeon: Clois Fret, MD;  Location: [facility] ORS;  Service: Neurosurgery;  Laterality: N/A;   BRONCHIAL NEEDLE ASPIRATION BIOPSY  06/17/2021   Procedure: BRONCHIAL NEEDLE ASPIRATION BIOPSIES;  Surgeon: Claudene Toribio BROCKS, MD;  Location: [facility] ENDOSCOPY;  Service: Pulmonary;;   IR IMAGING GUIDED PORT INSERTION  08/05/2021   MELANOMA EXCISION  2008   Left side of neck   RADIOLOGY WITH ANESTHESIA N/A 04/05/2020   Procedure: MRI SPINE WITOUT CONTRAST;  Surgeon: Radiologist, Medication, MD;  Location: [facility] OR;  Service: Radiology;  Laterality: N/A;   RADIOLOGY WITH ANESTHESIA N/A 08/22/2021   Procedure: MRI BRAIN WITH AND WITHOUT CONTRAST  WITH ANESTHESIA;  Surgeon: Radiologist, Medication, MD;  Location: [facility] OR;  Service: Radiology;  Laterality: N/A;   RADIOLOGY WITH ANESTHESIA N/A 12/17/2021   Procedure: MRI BRAIN WITH AND WITHOUT CONTRAST WITH ANESTHESIA; MRI LUMBER WITH AND WITHOUT CONTRAST;  Surgeon: Radiologist, Medication, MD;  Location: [facility] OR;  Service: Radiology;  Laterality: N/A;   RADIOLOGY WITH ANESTHESIA N/A 04/08/2022   Procedure: MRI LUMBER SPINE WITH AND WITHOUT CONTRAST;  Surgeon: Radiologist, Medication, MD;  Location: [facility] OR;  Service: Radiology;  Laterality: N/A;   TONSILLECTOMY     VIDEO BRONCHOSCOPY WITH ENDOBRONCHIAL ULTRASOUND N/A 06/17/2021   Procedure: VIDEO BRONCHOSCOPY WITH ENDOBRONCHIAL ULTRASOUND;  Surgeon: Claudene Toribio BROCKS, MD;  Location: [facility] ENDOSCOPY;  Service: Pulmonary;  Laterality: N/A;    HEMATOLOGY/ONCOLOGY HISTORY:  Oncology History Overview Note  Diagnosis: Stage IIB T2b N1 M0 adenocarcinoma of the LUL, poorly differentiated     Primary non-small cell carcinoma of upper lobe of left lung (HCC)  06/13/2021 Initial Diagnosis   Primary non-small cell  carcinoma of upper lobe of left lung [facility]) -06/20/2021 - 06/27/2021, patient presented to [facility] due to progressive headache/dizziness/gait changes. CT head showed acute to subacute intraparenchymal hemorrhage involving the left cerebellum.  Surrounding low-density vasogenic edema.  Patient was transferred to [facility]. This was further evaluated by CT angiogram of the neck which showed bulky calcified plaques/stenosis of carotid artery, Followed by MRI brain. 06/12/2021, MRI of the brain showed no significant interval change in size of the left cerebellar intraparenchymal hematoma with unchanged regional mass effect and partial effacement of fourth ventricle but no upstream hydrocephalus. There is no discernible enhancement to suggest underlying mass lesion, though acute blood products could mask enhancement.   06/12/2021 a chest x-ray showed a 4.4 cm left middle lobe. 06/13/2021, CT chest with contrast showed a 4.3 x 3.6 x 3.3 cm lobular spiculated mass in the posterior left upper lobe with T3 to the lateral pleura and major fissure.  Metastatic left hilar lymphadenopathy.  Peripheral micronodularity posterior right costophrenic sulcus.  Aortic atherosclerosis. 06/14/2021, CT abdomen pelvis showed right lower lobe pulmonary artery embolus.  No evidence of right heart strain.  No acute intra-abdominal or pelvic pathology.  Aortic atherosclerosis.  06/13/2021, patient underwent bronchoscopy with EBUS by Dr. Claudene.  Biopsy from the fine-needle aspiration of station 11 mL showed malignant cells, consistent with poorly differentiated non-small cell carcinoma, consistent with adenocarcinoma.  Malignant cells are TTF-1 positive and negative for p40.  Negative for neuroendocrine markers.  Blood test and tissue sample were tested for Gardant 360  -PD-L1 TPS 97%, no actionable mutation on the blood testing. 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   07/17/2021 Cancer Staging   Staging  form: Lung, AJCC 8th Edition - Clinical: Stage IV (cT2b, cN1, cM1) - Signed by Babara Call, MD on 08/06/2021   08/06/2021 Imaging   I saw patient's PET scan after his visit with me on 08/06/21. PET scan was ordered by his previous oncologist group and did not come to my in basket.. Patient received cycle 1 carboplatin  Taxol  today.  He has started on radiation.  5/26/3 PET scan showed 3.8 cm hypermetabolic left upper lobe mass with hypermetabolic left hilar and infrahilar adenopathy.  There is approximately 8 scattered metastatic lesions in the skeleton. Mixed density photopenic lesion in the left cerebellum. characterized as hemorrhage on recent prior imaging workups.    08/16/2021 -  Chemotherapy   Patient is on Treatment Plan : LUNG Carboplatin  (5) + Pemetrexed  + Pembrolizumab  (200) D1 q21d Induction x 4 cycles / Maintenance Pemetrexed  + Pembrolizumab  (200) D1 q21d      08/22/2021 Imaging   MRI brain w wo contrast  Decrease in size of left cerebellar hematoma with resolution of edema. No evidence of underlying lesion. Smaller, more recent hemorrhage in the left cerebellar vermis with minimal edema. There is minimal enhancement without definite evidence of underlying lesion.  New punctate focus of chronic blood products and enhancement in the left frontal lobe. Additional new foci of chronic blood products in the posterior right putamen, right parietal subcortical white matter, and posterior right cerebellum. Unclear at this time if these  represent foci of bland hemorrhage or early metastases.   Increase in size of right parietal osseous metastasis with minor extraosseous extension.     Imaging   PET scan showed 1. Interval response to therapy as evidenced by a small residual left upper lobe nodule with decreased hypermetabolism, no residual hypermetabolic adenopathy and decreased hypermetabolism associatedwith osseous metastases. 2. 6 mm posterior left upper lobe nodule, likely stable. Recommend  attention on follow-up. 3. Aortic atherosclerosis (ICD10-I70.0). Coronary artery calcification.   12/17/2021 Imaging   MRI lumbar spine w wo contrast  1. No evidence of regional metastatic disease. 2. Late subacute superior endplate fracture at L2 and large superior endplate Schmorl's node at L5, unchanged since the PET scan of 10/29/2021. 3. L3-4: Shallow disc protrusion. Facet and ligamentous hypertrophy. Stenosis of both lateral recesses. Findings slightly worsened since 2022. 4. L4-5: Shallow disc protrusion. Facet and ligamentous hypertrophy. Small synovial cyst arising from the facet joint on the right. Stenosis of the lateral recesses right worse than left. Foraminal narrowing right worse than left. Findings have worsened since 2022.  5. L5-S1: Endplate osteophytes and shallow protrusion of the disc. Facet and ligamentous hypertrophy. Stenosis of the subarticular lateral recesses and neural foramina, right worse than left. Similar appearance to the study of 2022. 6. Continued evidence of enteritis of at least 1 loop of small bowel. Distended bladder present   12/18/2021 Imaging   Brian MRI w wo  1. Interval decrease in size of the left cerebellar and vermian hematomas. No definite evidence of underlying metastatic lesion. 2. There is are two possible contrast enhancing lesions in the posterior left frontal lobe and left occipital lobe, which do not have intrinsic T1 signal abnormality, but have a somewhat linear appearance and may be vascular in nature. Recommend attention on follow up. 3. Multifocal sites of susceptibility artifact are redemonstrated with interval development of a few new foci, as described above. All of these sites demonstrate intrinsic T1 hyperintense signal abnormality and susceptibility artifact and are compatible with sites of microhemorrhages. Recommend continued attention on follow up. 4. Interval decrease in size of right parietal calvarial metastatic lesion. No  new contrast enhancing lesions visualized. 5. Diffusely heterogeneous marrow signal throughout the cervical spine, which is nonspecific but can be seen in the setting of anemia, smoking, obesity, or a marrow replacement process.     12/18/2021 Imaging   MRI lumbar spine w wo contrast  1. No evidence of regional metastatic disease. 2. Late subacute superior endplate fracture at L2 and large superior endplate Schmorl's node at L5, unchanged since the PET scan of 10/29/2021 3. L3-4: Shallow disc protrusion. Facet and ligamentous hypertrophy.Stenosis of both lateral recesses. Findings slightly worsened since 2022. 4. L4-5: Shallow disc protrusion. Facet and ligamentous hypertrophy. Small synovial cyst arising from the facet joint on the right. Stenosis of the lateral recesses right worse than left. Foraminal narrowing right worse than left. Findings have worsened since 2022. 5. L5-S1: Endplate osteophytes and shallow protrusion of the disc. Facet and ligamentous hypertrophy. Stenosis of the subarticular lateral recesses and neural foramina, right worse than left. Similar appearance to the study of 2022. 6. Continued evidence of enteritis of at least 1 loop of small bowel. Distended bladder present.       01/28/2022 Imaging   CT chest abdomen pelvis w contrast 1. Treated left upper lobe mass appears grossly stable to the prior examination when measured in a similar fashion on the prior study. No definite signs of extra skeletal metastatic disease  noted in the chest, abdomen or pelvis. 2. Widespread skeletal metastases redemonstrated, as above. 3. Hepatic steatosis. 4. Aortic atherosclerosis, in addition to left main and three-vessel coronary artery disease. Assessment for potential risk factor modification, dietary therapy or pharmacologic therapy may be warranted, if clinically indicated. 5. Additional incidental findings,    04/08/2022 Imaging   MRI lumbar spine w wo contrast  1. Stable  remote superior endplate fracture of L2 and large Schmorl to noted involving L5. No worrisome bone lesions to suggest metastatic disease. 2. Degenerative lumbar spondylosis with multilevel disc disease and facet disease which appears relatively stable as detailed above. 3. Enlarging right-sided synovial cyst at L4-5 with progressive mass effect on the right side of the thecal sac. This could potentially be symptomatic   04/28/2022 Imaging   CT chest abdomen pelvis wo contrast  1. Similar versus slightly decreased size of treated left upper lobe primary bronchogenic carcinoma. 2. Similar osseous metastasis. 3. No evidence of extraosseous metastatic disease. 4. New small right pleural effusion. 5. Coronary artery atherosclerosis. Aortic Atherosclerosis   07/03/2022 Imaging   PET scan showed 1. Interval development of a small focus of intense hypermetabolism within the left upper lobe scar. This is associated with a 5 mm perifissural nodule in the left lung which is hypermetabolic. Hypermetabolism at both of these locations is new since prior PET-CT and highly suspicious for recurrent disease. 2. No evidence for hypermetabolic soft tissue metastatic disease in the neck, abdomen, or pelvis. 3. Similar appearance of sclerotic and lytic bone lesions without hypermetabolism on PET imaging. 4. Focal hypermetabolism associated with a fracture of the right L4 transverse process. The fracture was visible on the previous CT of 04/28/2022. No associated soft tissue lesion to suggest that this is pathologic in nature. Tracer accumulation on today's study is in keeping with healing fracture. There is also some minimal uptake in a prominent spur associated with the L4 superior endplate, most likely degenerative. 5.  Aortic Atherosclerosis    08/01/2022 - 08/14/2022 Radiation Therapy   Radiation to left lung.    10/01/2022 Imaging   chest angiogram PE protocol  No pulmonary embolism identified.   Stable  nodular opacity in the left upper lobe. Please correlate for history of treated lung cancer. There is an adjacent small nodule along the course of the interlobar fissure which is slightly larger today compared to the study of February 2024. please correlate with findings of the recent PET-CT of 07/03/2022.      10/02/2022 Imaging   MRI brain with and without contrast 1. No acute intracranial abnormality. No evidence for intraparenchymal metastatic disease. 2. Chronic left cerebellar hemorrhage with multiple additional chronic micro hemorrhages elsewhere throughout the brain, stable. 3. Underlying mild chronic microvascular ischemic disease.    10/02/2022 Imaging   MRI lumbar spine showed 1. No MRI evidence for acute infection or other abnormality within the lumbar spine. 2. Few subcentimeter T1 hypointense lesions about the visualized posterior iliac wings. These correspond with small sclerotic foci seen on most recent CT from 04/08/2022, presumably reflecting patient's known osseous metastatic disease. These were not hypermetabolic on prior PET-CT from 94/97/7975. 3. No other evidence for metastatic disease within the lumbar spine itself. 4. Chronic L2 compression fracture, stable. Large Schmorl's node deformity with associated height loss at L5, also stable. 5. Multifactorial degenerative changes at L3-4 through L5-S1 with resultant mild to moderate bilateral subarticular stenosis, with mild to moderate bilateral L3 through L5 foraminal narrowing as above.   12/24/2022 Imaging  CT chest w contrast   1. Stable appearance of treated lung lesion within the left upper lobe with surrounding scarring and architectural distortion. 2. The previous FDG avid subpleural nodule abutting the oblique fissure of the left lung (adjacent to post treatment changes) is again noted measuring 7 mm. This is stable from 10/01/2022. On the PET-CT from 07/03/2022 this nodule was measured at 5 mm. 3. New  bandlike area of subpleural consolidation within the lateral and posterior left lower lobe is identified. This is favored to represent post treatment change. 4. Stable sclerotic lesions involving the T12 and L1 vertebra. 5. Coronary artery calcifications. 6.  Aortic Atherosclerosis    03/06/2023 Imaging   CT chest abdomen pelvis wo contrast showed  Increasing opacity along the lingula with the increasing nodular central component compared to the recent CT scan.   There is also some changing opacities in the left lower lobe. The larger more peripheral area in the left lower lobe is decreasing with some new areas of subtle ground-glass more caudal. Attention on follow-up.   No developing new other mass lesion or nodal enlargement.   Scattered sclerotic bone lesions has a similar distribution to previous.   Overall evaluation for solid organ pathology including solid organ metastases are limited without the advantage of IV contrast.     03/23/2023 Imaging   PET scan showed 1. Post radiation consolidation in the lingula with mild residual focal hypermetabolism, overall improved from 07/03/2022. No evidence of disease progression. 2. Quiescent osseous metastatic disease. 3. Aortic atherosclerosis (ICD10-I70.0). Coronary artery calcification.     ALLERGIES:  is allergic to niaspan  [niacin ].  MEDICATIONS:  Current Outpatient Medications  Medication Sig Dispense Refill   ALPRAZolam  (XANAX ) 0.5 MG tablet TAKE 1 TABLET(0.5 MG) BY MOUTH AT BEDTIME AS NEEDED FOR ANXIETY 15 tablet 0   cephALEXin  (KEFLEX ) 500 MG capsule Take 1 capsule (500 mg total) by mouth 3 (three) times daily. 21 capsule 0   citalopram  (CELEXA ) 10 MG tablet TAKE 1 TABLET(10 MG) BY MOUTH DAILY 30 tablet 3   docusate sodium  (COLACE) 100 MG capsule Take 100 mg by mouth 2 (two) times daily as needed for mild constipation.     fentaNYL  (DURAGESIC ) 75 MCG/HR Place 1 patch onto the skin every 3 (three) days. 10 patch 0    gabapentin  (NEURONTIN ) 300 MG capsule TAKE 2 CAPSULES(600 MG) BY MOUTH TWICE DAILY 120 capsule 2   insulin  glargine (LANTUS ) 100 UNIT/ML injection Inject 0-30 Units into the skin daily as needed (High Blood Sugar over 150).     Insulin  Pen Needle (B-D UF III MINI PEN NEEDLES) 31G X 5 MM MISC Use to inject insulin  daily. 100 each 3   lactulose  (CHRONULAC ) 10 GM/15ML solution Take 15 mLs (10 g total) by mouth 3 (three) times daily as needed. 236 mL 0   levETIRAcetam  (KEPPRA ) 500 MG tablet Take 1 tablet (500 mg total) by mouth 2 (two) times daily. 180 tablet 3   megestrol  (MEGACE ) 40 MG tablet Take 1 tablet (40 mg total) by mouth daily. 30 tablet 3   meloxicam  (MOBIC ) 7.5 MG tablet Take 1 tablet (7.5 mg total) by mouth daily. 30 tablet 3   methocarbamol  (ROBAXIN ) 500 MG tablet Take 1 tablet (500 mg total) by mouth every 6 (six) hours as needed for muscle spasms. 120 tablet 0   NOVOLOG  FLEXPEN 100 UNIT/ML FlexPen Inject 0-10 Units into the skin daily as needed for high blood sugar (BG > 200).     Nystatin  (GERHARDT'S  BUTT CREAM) CREA Apply 14 Applications topically 2 (two) times daily. 1 each 0   omeprazole  (PRILOSEC) 20 MG capsule Take 20 mg by mouth daily.     oxyCODONE -acetaminophen  (PERCOCET) 7.5-325 MG tablet Take 1 tablet by mouth every 4 (four) hours as needed for severe pain (pain score 7-10). Do not take with cough medication or other pain medication 60 tablet 0   predniSONE  (DELTASONE ) 20 MG tablet Take 1 tablet (20 mg total) by mouth daily with breakfast. 45 tablet 0   senna (SENOKOT) 8.6 MG TABS tablet Take 1 tablet (8.6 mg total) by mouth 2 (two) times daily as needed for mild constipation. 30 tablet 0   sulfamethoxazole -trimethoprim  (BACTRIM  DS) 800-160 MG tablet Take 1 tablet by mouth 2 (two) times daily. 14 tablet 0   tamsulosin  (FLOMAX ) 0.4 MG CAPS capsule TAKE 1 CAPSULE(0.4 MG) BY MOUTH DAILY 30 capsule 2   No current facility-administered medications for this visit.    Facility-Administered Medications Ordered in Other Visits  Medication Dose Route Frequency Provider Last Rate Last Admin   heparin  lock flush 100 UNIT/ML injection            sodium chloride  flush (NS) 0.9 % injection 10 mL  10 mL Intracatheter PRN Babara Call, MD   10 mL at 07/07/23 1434    VITAL SIGNS: There were no vitals taken for this visit. There were no vitals filed for this visit.   Estimated body mass index is 19.91 kg/m as calculated from the following:   Height as of 08/19/23: 5' 7 (1.702 m).   Weight as of an earlier encounter on 09/14/23: 127 lb 1.6 oz (57.7 kg).  LABS: CBC:    Component Value Date/Time   WBC 8.2 08/18/2023 1252   HGB 11.1 (L) 08/18/2023 1252   HGB 13.4 07/17/2021 1345   HCT 35.0 (L) 08/18/2023 1252   PLT 298 08/18/2023 1252   PLT 373 07/17/2021 1345   MCV 89.7 08/18/2023 1252   NEUTROABS 4.9 08/18/2023 1252   LYMPHSABS 1.2 08/18/2023 1252   MONOABS 0.7 08/18/2023 1252   EOSABS 1.3 (H) 08/18/2023 1252   BASOSABS 0.1 08/18/2023 1252   Comprehensive Metabolic Panel:    Component Value Date/Time   NA 133 (L) 08/18/2023 1252   K 4.3 08/18/2023 1252   CL 102 08/18/2023 1252   CO2 24 08/18/2023 1252   BUN 21 08/18/2023 1252   CREATININE 0.77 08/18/2023 1252   CREATININE 1.11 10/09/2022 1500   GLUCOSE 117 (H) 08/18/2023 1252   CALCIUM  8.3 (L) 08/18/2023 1252   AST 17 08/18/2023 1252   AST 18 07/17/2021 1345   ALT 10 08/18/2023 1252   ALT 20 07/17/2021 1345   ALKPHOS 77 08/18/2023 1252   BILITOT 0.7 08/18/2023 1252   BILITOT 0.6 07/17/2021 1345   PROT 6.6 08/18/2023 1252   ALBUMIN 2.9 (L) 08/18/2023 1252    RADIOGRAPHIC STUDIES: MR Brain W Wo Contrast Result Date: 09/02/2023 CLINICAL DATA:  73 year old male with lung cancer. History of remote left cerebellar hemorrhage on previous MRI. EXAM: MRI HEAD WITHOUT AND WITH CONTRAST TECHNIQUE: Multiplanar, multiecho pulse sequences of the brain and surrounding structures were obtained without and  with intravenous contrast. CONTRAST:  6mL GADAVIST  GADOBUTROL  1 MMOL/ML IV SOLN COMPARISON:  Brain MRI 04/18/2023 and earlier. FINDINGS: Brain: Stable cerebral volume from last year. Confluent chronic hemosiderin in the left cerebellar hemisphere, with associated mild regional volume loss and encephalomalacia. Half a dozen or so additional foci of scattered chronic microhemorrhage elsewhere  in the brain including both cerebral hemispheres (series 10), stable from last year and not rising to the level of amyloid angiopathy. No restricted diffusion to suggest acute infarction. No midline shift, mass effect, evidence of mass lesion, ventriculomegaly, extra-axial collection or acute intracranial hemorrhage. Cervicomedullary junction and pituitary are within normal limits. No abnormal enhancement identified. No dural thickening identified. Stable gray and white matter signal with no areas suggestive of cerebral edema. Chronic cerebral white matter T2 and FLAIR hyperintense foci are stable since December. Vascular: Major intracranial vascular flow voids are stable, with evidence of chronic distal left vertebral artery poor flow or occlusion (series 8, image 3) unchanged from last year. Following contrast major dural venous sinuses are enhancing and appear to be patent. Skull and upper cervical spine: Previous cervical ACDF partially visible. Background bone marrow signal stable and within normal limits. No acute or suspicious osseous lesion identified. Sinuses/Orbits: Stable and negative. Other: Mastoids well aerated. Visible internal auditory structures appear normal. Negative visible scalp and face. IMPRESSION: 1. No metastatic disease or acute intracranial abnormality identified. 2. Multiple chronic brain hemorrhages, largest in the left cerebellum and scattered microhemorrhages otherwise. The extent does not rise to the level of amyloid angiopathy. Electronically Signed   By: VEAR Hurst M.D.   On: 09/02/2023 05:37   DG  Abd 1 View Result Date: 08/20/2023 CLINICAL DATA:  Intraparenchymal hemorrhage, spinal cord lead EXAM: ABDOMEN - 1 VIEW COMPARISON:  07/29/2023, 08/19/2023 FINDINGS: 2 supine frontal views of the abdomen and pelvis are obtained. No bowel obstruction or ileus. No masses or abnormal calcifications. There are no unexpected radiopaque foreign bodies. Specifically, the lower lumbar spine electrode seen on the prior x-ray have been removed in the interim. There are no retained wire fragments identified on this exam, and none were seen on the CT performed yesterday. Excreted contrast within the urinary bladder. IMPRESSION: 1. Unremarkable bowel gas pattern. 2. Interval removal of the spinal electrode seen on prior x-ray 07/29/2023. No unexpected radiopaque foreign bodies are identified. Electronically Signed   By: Ozell Daring M.D.   On: 08/20/2023 15:20   CT CHEST ABDOMEN PELVIS W CONTRAST Result Date: 08/19/2023 CLINICAL DATA:  Metastatic lung cancer restaging * Tracking Code: BO * EXAM: CT CHEST, ABDOMEN, AND PELVIS WITH CONTRAST TECHNIQUE: Multidetector CT imaging of the chest, abdomen and pelvis was performed following the standard protocol during bolus administration of intravenous contrast. RADIATION DOSE REDUCTION: This exam was performed according to the departmental dose-optimization program which includes automated exposure control, adjustment of the mA and/or kV according to patient size and/or use of iterative reconstruction technique. CONTRAST:  OMNIPAQUE  IOHEXOL  300 MG/ML  SOLN COMPARISON:  PET-CT, 03/17/2023 FINDINGS: CT CHEST FINDINGS Cardiovascular: Right chest port catheter. Aortic atherosclerosis. Normal heart size. Three-vessel coronary artery calcifications. No pericardial effusion. Mediastinum/Nodes: No enlarged mediastinal, hilar, or axillary lymph nodes. Thyroid  gland, trachea, and esophagus demonstrate no significant findings. Lungs/Pleura: Unchanged perihilar mass in the left upper  lobe measuring 3.6 x 2.4 cm with adjacent bandlike radiation fibrosis (series 4, image 64). Additional small bilateral pulmonary nodules not significantly changed, for example a 0.4 cm nodule of the medial right upper lobe (series 4, image 44). New trace pleural effusions. Musculoskeletal: No chest wall abnormality. No acute osseous findings. CT ABDOMEN PELVIS FINDINGS Hepatobiliary: No solid liver abnormality is seen. No gallstones, gallbladder wall thickening, or biliary dilatation. Pancreas: Unremarkable. No pancreatic ductal dilatation or surrounding inflammatory changes. Spleen: Normal in size without significant abnormality. Adrenals/Urinary Tract: Adrenal glands are  unremarkable. Kidneys are normal, without renal calculi, solid lesion, or hydronephrosis. Bladder is unremarkable. Stomach/Bowel: Stomach is within normal limits. Appendix appears normal. No evidence of bowel wall thickening, distention, or inflammatory changes. Moderate burden of stool and stool balls throughout the colon and rectum. Vascular/Lymphatic: Aortic atherosclerosis. No enlarged abdominal or pelvic lymph nodes. Reproductive: No mass or other abnormality. Other: No abdominal wall hernia or abnormality. No ascites. Musculoskeletal: No acute osseous findings. Unchanged occasional sclerotic metastases, for example in the right ilium (series 2, image 98) and superior endplate of L4 (series 2, image 86). Unchanged mild superior endplate wedge deformity of L2. IMPRESSION: 1. Unchanged treated perihilar mass in the left upper lobe measuring 3.6 x 2.4 cm with adjacent bandlike radiation fibrosis. 2. Additional small bilateral pulmonary nodules not significantly changed. Attention on follow-up. 3. Unchanged occasional sclerotic osseous metastases. 4. No evidence of lymphadenopathy or soft tissue metastatic disease in the chest, abdomen, or pelvis. 5. New trace pleural effusions, nonspecific. 6. Coronary artery disease. Aortic Atherosclerosis  (ICD10-I70.0). Electronically Signed   By: Marolyn JONETTA Jaksch M.D.   On: 08/19/2023 21:47   PERFORMANCE STATUS (ECOG) : 2 - Symptomatic, <50% confined to bed  Review of Systems Unless otherwise noted, a complete review of systems is negative.  Physical Exam General: NAD Pulmonary: Unlabored Extremities: no edema, no joint deformities Skin: no rashes Neurological: Weakness but otherwise nonfocal  IMPRESSION: Follow-up visit.  Patient accompanied by wife.  Patient and wife tell me that they have subsequently enrolled him in hospice care.  They are not certain of the hospice organization but think it is [facility] [place]).   Reportedly, patient is mostly bed/chair bound at this point.  He is unable to stand or ambulate.  He was started on steroids by Dr. Buckley.  Initially, he had several days of perceived improvement in strength and functioning.  However, improvement did not sustain.  Wife has not seen any difference with dose reduction of prednisone .  She is paying out-of-pocket for daily care in addition to hospice resources.  Patient is pending EMG in August.  Despite hospice enrollment, wife says that she would like to keep EMG as scheduled and follow-up MD visits.  Pain is reportedly stable.  PLAN: -Best supportive care -Patient followed by hospice -Continue fentanyl /Percocet -Follow-up telephone visit 1 month  Case and plan discussed with Dr. Babara  Patient expressed understanding and was in agreement with this plan. He also understands that He can call the clinic at any time with any questions, concerns, or complaints.   Time Total: 20 minutes  Visit consisted of counseling and education dealing with the complex and emotionally intense issues of symptom management and palliative care in the setting of serious and potentially life-threatening illness.Greater than 50%  of this time was spent counseling and coordinating care related to the above assessment and  plan.  Signed by: Fonda Mower, [name], NP-C

## 2023-09-14 NOTE — Progress Notes (Signed)
 Hematology/Oncology Progress note [phone] [phone]       CHIEF COMPLAINTS/REASON FOR VISIT:  Metastatic non-small cell lung cancer  ASSESSMENT & PLAN:   Cancer Staging  Primary non-small cell carcinoma of upper lobe of left lung (HCC) Staging form: Lung, AJCC 8th Edition - Clinical: Stage IV (cT2b, cN1, cM1) - Signed by Babara Call, MD on 08/06/2021   Primary non-small cell carcinoma of upper lobe of left lung (HCC) Stage IV lung adenocarcinoma with brain and bone metastasis.  S/p lung radiation to left lung.  July CT and brain MRI showed stable disease.  No evidence of disease progression. Option of resuming Keytruda , versus observation was reviewed and discussed with patient. Patient and wife have enrolled to hospice services already. Despite hospice enrollment, wife says that she would like to keep EMG as scheduled.   Muscle weakness Chronic ongoing issue, progressively worse.  Likely multifactorial, due to deconditioning, multiple foci spinal stenosis, possible drug side effects.  Off Keytruda  treatments currently. follow-up appointment with neurology   Seizure [facility]) On Keppra .  Recommend patient to follow up with neurologist.   Follow-up as needed. All questions were answered. The patient knows to call the clinic with any problems, questions or concerns.  Call Babara, MD, [name] [facility] Hematology Oncology 09/14/2023      HISTORY OF PRESENTING ILLNESS:   Micheal Donovan is a  73 y.o.  male presents for follow up of Non-small cell lung cancer.  Oncology History Overview Note  Diagnosis: Stage IIB T2b N1 M0 adenocarcinoma of the LUL, poorly differentiated     Primary non-small cell carcinoma of upper lobe of left lung (HCC)  06/13/2021 Initial Diagnosis   Primary non-small cell carcinoma of upper lobe of left lung [facility]) -06/20/2021 - 06/27/2021, patient presented to [facility] due to progressive headache/dizziness/gait changes. CT head showed  acute to subacute intraparenchymal hemorrhage involving the left cerebellum.  Surrounding low-density vasogenic edema.  Patient was transferred to [facility]. This was further evaluated by CT angiogram of the neck which showed bulky calcified plaques/stenosis of carotid artery, Followed by MRI brain. 06/12/2021, MRI of the brain showed no significant interval change in size of the left cerebellar intraparenchymal hematoma with unchanged regional mass effect and partial effacement of fourth ventricle but no upstream hydrocephalus. There is no discernible enhancement to suggest underlying mass lesion, though acute blood products could mask enhancement.   06/12/2021 a chest x-ray showed a 4.4 cm left middle lobe. 06/13/2021, CT chest with contrast showed a 4.3 x 3.6 x 3.3 cm lobular spiculated mass in the posterior left upper lobe with T3 to the lateral pleura and major fissure.  Metastatic left hilar lymphadenopathy.  Peripheral micronodularity posterior right costophrenic sulcus.  Aortic atherosclerosis. 06/14/2021, CT abdomen pelvis showed right lower lobe pulmonary artery embolus.  No evidence of right heart strain.  No acute intra-abdominal or pelvic pathology.  Aortic atherosclerosis.  06/13/2021, patient underwent bronchoscopy with EBUS by Dr. Claudene.  Biopsy from the fine-needle aspiration of station 11 mL showed malignant cells, consistent with poorly differentiated non-small cell carcinoma, consistent with adenocarcinoma.  Malignant cells are TTF-1 positive and negative for p40.  Negative for neuroendocrine markers.  Blood test and tissue sample were tested for Gardant 360  -PD-L1 TPS 97%, no actionable mutation on the blood testing. 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   07/17/2021 Cancer Staging   Staging form: Lung, AJCC 8th Edition - Clinical: Stage IV (cT2b, cN1, cM1) - Signed by Babara,  [place], MD on 08/06/2021   08/06/2021 Imaging   I saw patient's PET scan after his visit  with me on 08/06/21. PET scan was ordered by his previous oncologist group and did not come to my in basket.. Patient received cycle 1 carboplatin  Taxol  today.  He has started on radiation.  5/26/3 PET scan showed 3.8 cm hypermetabolic left upper lobe mass with hypermetabolic left hilar and infrahilar adenopathy.  There is approximately 8 scattered metastatic lesions in the skeleton. Mixed density photopenic lesion in the left cerebellum. characterized as hemorrhage on recent prior imaging workups.    08/16/2021 -  Chemotherapy   Patient is on Treatment Plan : LUNG Carboplatin  (5) + Pemetrexed  + Pembrolizumab  (200) D1 q21d Induction x 4 cycles / Maintenance Pemetrexed  + Pembrolizumab  (200) D1 q21d      08/22/2021 Imaging   MRI brain w wo contrast  Decrease in size of left cerebellar hematoma with resolution of edema. No evidence of underlying lesion. Smaller, more recent hemorrhage in the left cerebellar vermis with minimal edema. There is minimal enhancement without definite evidence of underlying lesion.  New punctate focus of chronic blood products and enhancement in the left frontal lobe. Additional new foci of chronic blood products in the posterior right putamen, right parietal subcortical white matter, and posterior right cerebellum. Unclear at this time if these represent foci of bland hemorrhage or early metastases.   Increase in size of right parietal osseous metastasis with minor extraosseous extension.     Imaging   PET scan showed 1. Interval response to therapy as evidenced by a small residual left upper lobe nodule with decreased hypermetabolism, no residual hypermetabolic adenopathy and decreased hypermetabolism associatedwith osseous metastases. 2. 6 mm posterior left upper lobe nodule, likely stable. Recommend attention on follow-up. 3. Aortic atherosclerosis (ICD10-I70.0). Coronary artery calcification.   12/17/2021 Imaging   MRI lumbar spine w wo contrast  1. No evidence of  regional metastatic disease. 2. Late subacute superior endplate fracture at L2 and large superior endplate Schmorl's node at L5, unchanged since the PET scan of 10/29/2021. 3. L3-4: Shallow disc protrusion. Facet and ligamentous hypertrophy. Stenosis of both lateral recesses. Findings slightly worsened since 2022. 4. L4-5: Shallow disc protrusion. Facet and ligamentous hypertrophy. Small synovial cyst arising from the facet joint on the right. Stenosis of the lateral recesses right worse than left. Foraminal narrowing right worse than left. Findings have worsened since 2022.  5. L5-S1: Endplate osteophytes and shallow protrusion of the disc. Facet and ligamentous hypertrophy. Stenosis of the subarticular lateral recesses and neural foramina, right worse than left. Similar appearance to the study of 2022. 6. Continued evidence of enteritis of at least 1 loop of small bowel. Distended bladder present   12/18/2021 Imaging   Brian MRI w wo  1. Interval decrease in size of the left cerebellar and vermian hematomas. No definite evidence of underlying metastatic lesion. 2. There is are two possible contrast enhancing lesions in the posterior left frontal lobe and left occipital lobe, which do not have intrinsic T1 signal abnormality, but have a somewhat linear appearance and may be vascular in nature. Recommend attention on follow up. 3. Multifocal sites of susceptibility artifact are redemonstrated with interval development of a few new foci, as described above. All of these sites demonstrate intrinsic T1 hyperintense signal abnormality and susceptibility artifact and are compatible with sites of microhemorrhages. Recommend continued attention on follow up. 4. Interval decrease in size of right parietal calvarial metastatic lesion. No new contrast enhancing  lesions visualized. 5. Diffusely heterogeneous marrow signal throughout the cervical spine, which is nonspecific but can be seen in the setting of  anemia, smoking, obesity, or a marrow replacement process.     12/18/2021 Imaging   MRI lumbar spine w wo contrast  1. No evidence of regional metastatic disease. 2. Late subacute superior endplate fracture at L2 and large superior endplate Schmorl's node at L5, unchanged since the PET scan of 10/29/2021 3. L3-4: Shallow disc protrusion. Facet and ligamentous hypertrophy.Stenosis of both lateral recesses. Findings slightly worsened since 2022. 4. L4-5: Shallow disc protrusion. Facet and ligamentous hypertrophy. Small synovial cyst arising from the facet joint on the right. Stenosis of the lateral recesses right worse than left. Foraminal narrowing right worse than left. Findings have worsened since 2022. 5. L5-S1: Endplate osteophytes and shallow protrusion of the disc. Facet and ligamentous hypertrophy. Stenosis of the subarticular lateral recesses and neural foramina, right worse than left. Similar appearance to the study of 2022. 6. Continued evidence of enteritis of at least 1 loop of small bowel. Distended bladder present.       01/28/2022 Imaging   CT chest abdomen pelvis w contrast 1. Treated left upper lobe mass appears grossly stable to the prior examination when measured in a similar fashion on the prior study. No definite signs of extra skeletal metastatic disease noted in the chest, abdomen or pelvis. 2. Widespread skeletal metastases redemonstrated, as above. 3. Hepatic steatosis. 4. Aortic atherosclerosis, in addition to left main and three-vessel coronary artery disease. Assessment for potential risk factor modification, dietary therapy or pharmacologic therapy may be warranted, if clinically indicated. 5. Additional incidental findings,    04/08/2022 Imaging   MRI lumbar spine w wo contrast  1. Stable remote superior endplate fracture of L2 and large Schmorl to noted involving L5. No worrisome bone lesions to suggest metastatic disease. 2. Degenerative lumbar spondylosis  with multilevel disc disease and facet disease which appears relatively stable as detailed above. 3. Enlarging right-sided synovial cyst at L4-5 with progressive mass effect on the right side of the thecal sac. This could potentially be symptomatic   04/28/2022 Imaging   CT chest abdomen pelvis wo contrast  1. Similar versus slightly decreased size of treated left upper lobe primary bronchogenic carcinoma. 2. Similar osseous metastasis. 3. No evidence of extraosseous metastatic disease. 4. New small right pleural effusion. 5. Coronary artery atherosclerosis. Aortic Atherosclerosis   07/03/2022 Imaging   PET scan showed 1. Interval development of a small focus of intense hypermetabolism within the left upper lobe scar. This is associated with a 5 mm perifissural nodule in the left lung which is hypermetabolic. Hypermetabolism at both of these locations is new since prior PET-CT and highly suspicious for recurrent disease. 2. No evidence for hypermetabolic soft tissue metastatic disease in the neck, abdomen, or pelvis. 3. Similar appearance of sclerotic and lytic bone lesions without hypermetabolism on PET imaging. 4. Focal hypermetabolism associated with a fracture of the right L4 transverse process. The fracture was visible on the previous CT of 04/28/2022. No associated soft tissue lesion to suggest that this is pathologic in nature. Tracer accumulation on today's study is in keeping with healing fracture. There is also some minimal uptake in a prominent spur associated with the L4 superior endplate, most likely degenerative. 5.  Aortic Atherosclerosis    08/01/2022 - 08/14/2022 Radiation Therapy   Radiation to left lung.    10/01/2022 Imaging   chest angiogram PE protocol  No pulmonary embolism identified.  Stable nodular opacity in the left upper lobe. Please correlate for history of treated lung cancer. There is an adjacent small nodule along the course of the interlobar fissure which is  slightly larger today compared to the study of February 2024. please correlate with findings of the recent PET-CT of 07/03/2022.      10/02/2022 Imaging   MRI brain with and without contrast 1. No acute intracranial abnormality. No evidence for intraparenchymal metastatic disease. 2. Chronic left cerebellar hemorrhage with multiple additional chronic micro hemorrhages elsewhere throughout the brain, stable. 3. Underlying mild chronic microvascular ischemic disease.    10/02/2022 Imaging   MRI lumbar spine showed 1. No MRI evidence for acute infection or other abnormality within the lumbar spine. 2. Few subcentimeter T1 hypointense lesions about the visualized posterior iliac wings. These correspond with small sclerotic foci seen on most recent CT from 04/08/2022, presumably reflecting patient's known osseous metastatic disease. These were not hypermetabolic on prior PET-CT from 94/97/7975. 3. No other evidence for metastatic disease within the lumbar spine itself. 4. Chronic L2 compression fracture, stable. Large Schmorl's node deformity with associated height loss at L5, also stable. 5. Multifactorial degenerative changes at L3-4 through L5-S1 with resultant mild to moderate bilateral subarticular stenosis, with mild to moderate bilateral L3 through L5 foraminal narrowing as above.   12/24/2022 Imaging   CT chest w contrast   1. Stable appearance of treated lung lesion within the left upper lobe with surrounding scarring and architectural distortion. 2. The previous FDG avid subpleural nodule abutting the oblique fissure of the left lung (adjacent to post treatment changes) is again noted measuring 7 mm. This is stable from 10/01/2022. On the PET-CT from 07/03/2022 this nodule was measured at 5 mm. 3. New bandlike area of subpleural consolidation within the lateral and posterior left lower lobe is identified. This is favored to represent post treatment change. 4. Stable sclerotic  lesions involving the T12 and L1 vertebra. 5. Coronary artery calcifications. 6.  Aortic Atherosclerosis    03/06/2023 Imaging   CT chest abdomen pelvis wo contrast showed  Increasing opacity along the lingula with the increasing nodular central component compared to the recent CT scan.   There is also some changing opacities in the left lower lobe. The larger more peripheral area in the left lower lobe is decreasing with some new areas of subtle ground-glass more caudal. Attention on follow-up.   No developing new other mass lesion or nodal enlargement.   Scattered sclerotic bone lesions has a similar distribution to previous.   Overall evaluation for solid organ pathology including solid organ metastases are limited without the advantage of IV contrast.     03/23/2023 Imaging   PET scan showed 1. Post radiation consolidation in the lingula with mild residual focal hypermetabolism, overall improved from 07/03/2022. No evidence of disease progression. 2. Quiescent osseous metastatic disease. 3. Aortic atherosclerosis (ICD10-I70.0). Coronary artery calcification.   08/17/2023 Imaging   CT chest abdomen pelvis w contrast showed 1. Unchanged treated perihilar mass in the left upper lobe measuring 3.6 x 2.4 cm with adjacent bandlike radiation fibrosis. 2. Additional small bilateral pulmonary nodules not significantly changed. Attention on follow-up. 3. Unchanged occasional sclerotic osseous metastases. 4. No evidence of lymphadenopathy or soft tissue metastatic disease in the chest, abdomen, or pelvis. 5. New trace pleural effusions, nonspecific. 6. Coronary artery disease   08/19/2023 Imaging   MRI brain with and without contrast 1. No metastatic disease or acute intracranial abnormality identified.   2. Multiple  chronic brain hemorrhages, largest in the left cerebellum and scattered microhemorrhages otherwise. The extent does not rise to the level of amyloid angiopathy.    # Patient has a history of melanoma on his neck, treated in 2009. # History of hemorrhagic infarct left cerebellum  10/01/2022 - 10/06/2022 patient was admitted to the hospital due to lower extremity weakness and intermittent aphasia. Patient reports acute on chronic left lower extremity weakness, weakness has been more prominent over the past 2 weeks.  He reports significant episode of difficulty with ambulation 2 days after his last Keytruda  treatments.  He reports that he felt loss of control of muscle at that time.  Patient reports that the weakness is different than her chronic lower extremity  weakness.  Patient was found to have orthostatic hypotension Flomax  irbesartan  were held.  CK wnl, Neurology was consulted and started the patient on Keppra  for possible seizure.  EEG was negative.  01/31/23 He recently presented to ER due to altered mental status and shaking episodes.  CT head wo negative. UA positive for nitrate and leukocyte esterase. Code sepsis was actived in ER and he was recommended for treatment of UTI.  He declined admission and went home with Keflex  500mg  4 times daily for 10 days.  Lower extremity weakness localizes best to lumbosacral nerve roots, known spondylosis with multiple foci of neuro-foraminal stenosis.  02/09/2023 S/p decompression neurosurgery   04/17/2023 - 04/21/2023 hospitalized due to seizure, and UTI he was loaded with Keppra . Seen by neurology in office. UTI was treated with IV rocephin , discharged on Cipro .  INTERVAL HISTORY Micheal Donovan is a 73 y.o. male who has above history reviewed by me today presents for follow up visit for management of  Stage IV lung adenocarcinoma Accompanied by spouse He experiences progressive weakness and fatigue, with a daily increase in weakness. He requires assistance from his caregiver for mobility, particularly when getting into the car. He is unable to lift his foot or perform movements without pain. He is unable to perform  basic movements or assist in his own mobility.  Patient has had CT and brain MRI done.  He presented to discuss results.  Accompanied by wife.  Patient was seen by neurology Dr. Buckley Patient was placed on prednisone  50 mg, and later titrated down to 20 mg daily.  Patient has an EMG scheduled in August.  Patient has not experienced any significant improvement since the start of steroids.   Review of Systems  Constitutional:  Positive for fatigue. Negative for appetite change, chills, fever and unexpected weight change.  HENT:   Negative for hearing loss and voice change.   Eyes:  Negative for eye problems and icterus.  Respiratory:  Positive for shortness of breath. Negative for chest tightness and cough.   Cardiovascular:  Positive for leg swelling. Negative for chest pain.  Gastrointestinal:  Negative for abdominal distention, abdominal pain and blood in stool.  Endocrine: Negative for hot flashes.  Genitourinary:  Negative for difficulty urinating, dysuria and frequency.   Musculoskeletal:  Positive for arthralgias and back pain.       Chronic lower extremity weakness  Skin:  Negative for itching and rash.  Neurological:  Negative for extremity weakness, headaches, light-headedness and numbness.  Hematological:  Negative for adenopathy. Does not bruise/bleed easily.  Psychiatric/Behavioral:  Negative for confusion.     MEDICAL HISTORY:  Past Medical History:  Diagnosis Date   Allergy    Anxiety    BPH (benign prostatic hypertrophy)  Cancer [facility])    Melanoma on Neck    2008   Carotid artery occlusion    CHF (congestive heart failure) (HCC)    Diabetes mellitus    type 2   ED (erectile dysfunction)    GERD (gastroesophageal reflux disease)    Hemorrhagic stroke (HCC) 06/2021   Hyperlipidemia    Hypertension    Lung cancer (HCC)    Neoplasm related pain    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)    Stroke [facility])     SURGICAL HISTORY: Past Surgical History:  Procedure  Laterality Date   ANTERIOR CERVICAL DECOMP/DISCECTOMY FUSION N/A 02/09/2023   Procedure: C3-5 ANTERIOR CERVICAL DISCECTOMY AND FUSION;  Surgeon: Clois Fret, MD;  Location: [facility] ORS;  Service: Neurosurgery;  Laterality: N/A;   BRONCHIAL NEEDLE ASPIRATION BIOPSY  06/17/2021   Procedure: BRONCHIAL NEEDLE ASPIRATION BIOPSIES;  Surgeon: Claudene Toribio BROCKS, MD;  Location: [facility] ENDOSCOPY;  Service: Pulmonary;;   IR IMAGING GUIDED PORT INSERTION  08/05/2021   MELANOMA EXCISION  2008   Left side of neck   RADIOLOGY WITH ANESTHESIA N/A 04/05/2020   Procedure: MRI SPINE WITOUT CONTRAST;  Surgeon: Radiologist, Medication, MD;  Location: [facility] OR;  Service: Radiology;  Laterality: N/A;   RADIOLOGY WITH ANESTHESIA N/A 08/22/2021   Procedure: MRI BRAIN WITH AND WITHOUT CONTRAST  WITH ANESTHESIA;  Surgeon: Radiologist, Medication, MD;  Location: [facility] OR;  Service: Radiology;  Laterality: N/A;   RADIOLOGY WITH ANESTHESIA N/A 12/17/2021   Procedure: MRI BRAIN WITH AND WITHOUT CONTRAST WITH ANESTHESIA; MRI LUMBER WITH AND WITHOUT CONTRAST;  Surgeon: Radiologist, Medication, MD;  Location: [facility] OR;  Service: Radiology;  Laterality: N/A;   RADIOLOGY WITH ANESTHESIA N/A 04/08/2022   Procedure: MRI LUMBER SPINE WITH AND WITHOUT CONTRAST;  Surgeon: Radiologist, Medication, MD;  Location: [facility] OR;  Service: Radiology;  Laterality: N/A;   TONSILLECTOMY     VIDEO BRONCHOSCOPY WITH ENDOBRONCHIAL ULTRASOUND N/A 06/17/2021   Procedure: VIDEO BRONCHOSCOPY WITH ENDOBRONCHIAL ULTRASOUND;  Surgeon: Claudene Toribio BROCKS, MD;  Location: [facility] ENDOSCOPY;  Service: Pulmonary;  Laterality: N/A;    SOCIAL HISTORY: Social History   Socioeconomic History   Marital status: Married    Spouse name: Not on file   Number of children: Not on file   Years of education: Not on file   Highest education level: Associate degree: occupational, Scientist, product/process development, or vocational program  Occupational History   Not on file  Tobacco Use   Smoking status: Former    Current  packs/day: 0.00    Types: Cigarettes    Quit date: 07/21/1990    Years since quitting: 33.1   Smokeless tobacco: Never  Vaping Use   Vaping status: Never Used  Substance and Sexual Activity   Alcohol use: No   Drug use: No   Sexual activity: Not Currently  Other Topics Concern   Not on file  Social History Narrative   Not on file   Social Drivers of Health   Financial Resource Strain: Low Risk  (05/27/2023)   Overall Financial Resource Strain (CARDIA)    Difficulty of Paying Living Expenses: Not very hard  Food Insecurity: No Food Insecurity (05/27/2023)   Hunger Vital Sign    Worried About Running Out of Food in the Last Year: Never true    Ran Out of Food in the Last Year: Never true  Transportation Needs: No Transportation Needs (05/27/2023)   PRAPARE - Administrator, Civil Service (Medical): No    Lack of Transportation (Non-Medical):  No  Physical Activity: Unknown (05/27/2023)   Exercise Vital Sign    Days of Exercise per Week: 0 days    Minutes of Exercise per Session: Not on file  Stress: Stress Concern Present (05/27/2023)   [employer] of Occupational Health - Occupational Stress Questionnaire    Feeling of Stress : To some extent  Social Connections: Unknown (05/27/2023)   Social Connection and Isolation Panel    Frequency of Communication with Friends and Family: Once a week    Frequency of Social Gatherings with Friends and Family: Patient declined    Attends Religious Services: Patient unable to answer    Active Member of Clubs or Organizations: No    Attends Banker Meetings: Patient unable to answer    Marital Status: Married  Catering manager Violence: Not At Risk (04/22/2023)   Humiliation, Afraid, Rape, and Kick questionnaire    Fear of Current or Ex-Partner: No    Emotionally Abused: No    Physically Abused: No    Sexually Abused: No    FAMILY HISTORY: Family History  Problem Relation Age of Onset   COPD Mother     Clotting disorder Mother    Heart disease Father    Heart disease Brother        MI at age 73   Clotting disorder Maternal Grandmother    Stroke Neg Hx     ALLERGIES:  is allergic to niaspan  [niacin ].  MEDICATIONS:  Current Outpatient Medications  Medication Sig Dispense Refill   ALPRAZolam  (XANAX ) 0.5 MG tablet TAKE 1 TABLET(0.5 MG) BY MOUTH AT BEDTIME AS NEEDED FOR ANXIETY 15 tablet 0   cephALEXin  (KEFLEX ) 500 MG capsule Take 1 capsule (500 mg total) by mouth 3 (three) times daily. 21 capsule 0   citalopram  (CELEXA ) 10 MG tablet TAKE 1 TABLET(10 MG) BY MOUTH DAILY 30 tablet 3   docusate sodium  (COLACE) 100 MG capsule Take 100 mg by mouth 2 (two) times daily as needed for mild constipation.     fentaNYL  (DURAGESIC ) 75 MCG/HR Place 1 patch onto the skin every 3 (three) days. 10 patch 0   gabapentin  (NEURONTIN ) 300 MG capsule TAKE 2 CAPSULES(600 MG) BY MOUTH TWICE DAILY 120 capsule 2   insulin  glargine (LANTUS ) 100 UNIT/ML injection Inject 0-30 Units into the skin daily as needed (High Blood Sugar over 150).     Insulin  Pen Needle (B-D UF III MINI PEN NEEDLES) 31G X 5 MM MISC Use to inject insulin  daily. 100 each 3   lactulose  (CHRONULAC ) 10 GM/15ML solution Take 15 mLs (10 g total) by mouth 3 (three) times daily as needed. 236 mL 0   levETIRAcetam  (KEPPRA ) 500 MG tablet Take 1 tablet (500 mg total) by mouth 2 (two) times daily. 180 tablet 3   megestrol  (MEGACE ) 40 MG tablet Take 1 tablet (40 mg total) by mouth daily. 30 tablet 3   meloxicam  (MOBIC ) 7.5 MG tablet Take 1 tablet (7.5 mg total) by mouth daily. 30 tablet 3   methocarbamol  (ROBAXIN ) 500 MG tablet Take 1 tablet (500 mg total) by mouth every 6 (six) hours as needed for muscle spasms. 120 tablet 0   NOVOLOG  FLEXPEN 100 UNIT/ML FlexPen Inject 0-10 Units into the skin daily as needed for high blood sugar (BG > 200).     Nystatin  (GERHARDT'S BUTT CREAM) CREA Apply 14 Applications topically 2 (two) times daily. 1 each 0   omeprazole   (PRILOSEC) 20 MG capsule Take 20 mg by mouth daily.  oxyCODONE -acetaminophen  (PERCOCET) 7.5-325 MG tablet Take 1 tablet by mouth every 4 (four) hours as needed for severe pain (pain score 7-10). Do not take with cough medication or other pain medication 60 tablet 0   predniSONE  (DELTASONE ) 20 MG tablet Take 1 tablet (20 mg total) by mouth daily with breakfast. 45 tablet 0   senna (SENOKOT) 8.6 MG TABS tablet Take 1 tablet (8.6 mg total) by mouth 2 (two) times daily as needed for mild constipation. 30 tablet 0   sulfamethoxazole -trimethoprim  (BACTRIM  DS) 800-160 MG tablet Take 1 tablet by mouth 2 (two) times daily. 14 tablet 0   tamsulosin  (FLOMAX ) 0.4 MG CAPS capsule TAKE 1 CAPSULE(0.4 MG) BY MOUTH DAILY 30 capsule 2   No current facility-administered medications for this visit.   Facility-Administered Medications Ordered in Other Visits  Medication Dose Route Frequency Provider Last Rate Last Admin   heparin  lock flush 100 UNIT/ML injection            sodium chloride  flush (NS) 0.9 % injection 10 mL  10 mL Intracatheter PRN Babara Call, MD   10 mL at 07/07/23 1434     PHYSICAL EXAMINATION:  Vitals:   09/14/23 1400  BP: 111/62  Pulse: 61  Resp: 18  Temp: (!) 96.7 F (35.9 C)  SpO2: 100%   Filed Weights   09/14/23 1400  Weight: 127 lb 1.6 oz (57.7 kg)    Physical Exam Constitutional:      General: He is not in acute distress.    Appearance: He is ill-appearing.     Comments: Patient sits in the wheelchair  HENT:     Head: Normocephalic and atraumatic.  Eyes:     General: No scleral icterus. Cardiovascular:     Rate and Rhythm: Normal rate and regular rhythm.     Heart sounds: Normal heart sounds.  Pulmonary:     Effort: Pulmonary effort is normal. No respiratory distress.     Breath sounds: No wheezing.     Comments: Decreased breath sound bilaterally. Abdominal:     General: Bowel sounds are normal. There is no distension.     Palpations: Abdomen is soft.   Musculoskeletal:        General: No deformity. Normal range of motion.     Cervical back: Normal range of motion and neck supple.     Comments: Bilateral ankle trace edema.  Skin:    Findings: No rash.  Neurological:     Mental Status: He is alert. Mental status is at baseline.     Cranial Nerves: No cranial nerve deficit.     Comments: Chronic left-sided weakness  Psychiatric:        Mood and Affect: Mood normal.     LABORATORY DATA:  I have reviewed the data as listed    Latest Ref Rng & Units 08/18/2023   12:52 PM 07/29/2023    1:22 PM 07/07/2023   12:37 PM  CBC  WBC 4.0 - 10.5 K/uL 8.2  8.2  7.4   Hemoglobin 13.0 - 17.0 g/dL 88.8  88.0  87.6   Hematocrit 39.0 - 52.0 % 35.0  37.8  37.9   Platelets 150 - 400 K/uL 298  325  270       Latest Ref Rng & Units 08/18/2023   12:52 PM 07/29/2023    1:22 PM 07/07/2023   12:37 PM  CMP  Glucose 70 - 99 mg/dL 882  889  890   BUN 8 - 23 mg/dL 21  23  18   Creatinine 0.61 - 1.24 mg/dL 9.22  9.21  9.26   Sodium 135 - 145 mmol/L 133  134  135   Potassium 3.5 - 5.1 mmol/L 4.3  4.2  3.8   Chloride 98 - 111 mmol/L 102  104  101   CO2 22 - 32 mmol/L 24  24  24    Calcium  8.9 - 10.3 mg/dL 8.3  7.9  8.3   Total Protein 6.5 - 8.1 g/dL 6.6  6.1  6.3   Total Bilirubin 0.0 - 1.2 mg/dL 0.7  0.7  0.5   Alkaline Phos 38 - 126 U/L 77  64  68   AST 15 - 41 U/L 17  19  18    ALT 0 - 44 U/L 10  10  9       Iron /TIBC/Ferritin/ %Sat    Component Value Date/Time   IRON  45 08/18/2023 1358   TIBC 274 08/18/2023 1358   FERRITIN 187 08/18/2023 1358   IRONPCTSAT 16 (L) 08/18/2023 1358      RADIOGRAPHIC STUDIES: I have personally reviewed the radiological images as listed and agreed with the findings in the report. MR Brain W Wo Contrast Result Date: 09/02/2023 CLINICAL DATA:  73 year old male with lung cancer. History of remote left cerebellar hemorrhage on previous MRI. EXAM: MRI HEAD WITHOUT AND WITH CONTRAST TECHNIQUE: Multiplanar, multiecho pulse  sequences of the brain and surrounding structures were obtained without and with intravenous contrast. CONTRAST:  6mL GADAVIST  GADOBUTROL  1 MMOL/ML IV SOLN COMPARISON:  Brain MRI 04/18/2023 and earlier. FINDINGS: Brain: Stable cerebral volume from last year. Confluent chronic hemosiderin in the left cerebellar hemisphere, with associated mild regional volume loss and encephalomalacia. Half a dozen or so additional foci of scattered chronic microhemorrhage elsewhere in the brain including both cerebral hemispheres (series 10), stable from last year and not rising to the level of amyloid angiopathy. No restricted diffusion to suggest acute infarction. No midline shift, mass effect, evidence of mass lesion, ventriculomegaly, extra-axial collection or acute intracranial hemorrhage. Cervicomedullary junction and pituitary are within normal limits. No abnormal enhancement identified. No dural thickening identified. Stable gray and white matter signal with no areas suggestive of cerebral edema. Chronic cerebral white matter T2 and FLAIR hyperintense foci are stable since December. Vascular: Major intracranial vascular flow voids are stable, with evidence of chronic distal left vertebral artery poor flow or occlusion (series 8, image 3) unchanged from last year. Following contrast major dural venous sinuses are enhancing and appear to be patent. Skull and upper cervical spine: Previous cervical ACDF partially visible. Background bone marrow signal stable and within normal limits. No acute or suspicious osseous lesion identified. Sinuses/Orbits: Stable and negative. Other: Mastoids well aerated. Visible internal auditory structures appear normal. Negative visible scalp and face. IMPRESSION: 1. No metastatic disease or acute intracranial abnormality identified. 2. Multiple chronic brain hemorrhages, largest in the left cerebellum and scattered microhemorrhages otherwise. The extent does not rise to the level of amyloid  angiopathy. Electronically Signed   By: VEAR Hurst M.D.   On: 09/02/2023 05:37   DG Abd 1 View Result Date: 08/20/2023 CLINICAL DATA:  Intraparenchymal hemorrhage, spinal cord lead EXAM: ABDOMEN - 1 VIEW COMPARISON:  07/29/2023, 08/19/2023 FINDINGS: 2 supine frontal views of the abdomen and pelvis are obtained. No bowel obstruction or ileus. No masses or abnormal calcifications. There are no unexpected radiopaque foreign bodies. Specifically, the lower lumbar spine electrode seen on the prior x-ray have been removed in the interim. There are  no retained wire fragments identified on this exam, and none were seen on the CT performed yesterday. Excreted contrast within the urinary bladder. IMPRESSION: 1. Unremarkable bowel gas pattern. 2. Interval removal of the spinal electrode seen on prior x-ray 07/29/2023. No unexpected radiopaque foreign bodies are identified. Electronically Signed   By: Ozell Daring M.D.   On: 08/20/2023 15:20   CT CHEST ABDOMEN PELVIS W CONTRAST Result Date: 08/19/2023 CLINICAL DATA:  Metastatic lung cancer restaging * Tracking Code: BO * EXAM: CT CHEST, ABDOMEN, AND PELVIS WITH CONTRAST TECHNIQUE: Multidetector CT imaging of the chest, abdomen and pelvis was performed following the standard protocol during bolus administration of intravenous contrast. RADIATION DOSE REDUCTION: This exam was performed according to the departmental dose-optimization program which includes automated exposure control, adjustment of the mA and/or kV according to patient size and/or use of iterative reconstruction technique. CONTRAST:  OMNIPAQUE  IOHEXOL  300 MG/ML  SOLN COMPARISON:  PET-CT, 03/17/2023 FINDINGS: CT CHEST FINDINGS Cardiovascular: Right chest port catheter. Aortic atherosclerosis. Normal heart size. Three-vessel coronary artery calcifications. No pericardial effusion. Mediastinum/Nodes: No enlarged mediastinal, hilar, or axillary lymph nodes. Thyroid  gland, trachea, and esophagus demonstrate  no significant findings. Lungs/Pleura: Unchanged perihilar mass in the left upper lobe measuring 3.6 x 2.4 cm with adjacent bandlike radiation fibrosis (series 4, image 64). Additional small bilateral pulmonary nodules not significantly changed, for example a 0.4 cm nodule of the medial right upper lobe (series 4, image 44). New trace pleural effusions. Musculoskeletal: No chest wall abnormality. No acute osseous findings. CT ABDOMEN PELVIS FINDINGS Hepatobiliary: No solid liver abnormality is seen. No gallstones, gallbladder wall thickening, or biliary dilatation. Pancreas: Unremarkable. No pancreatic ductal dilatation or surrounding inflammatory changes. Spleen: Normal in size without significant abnormality. Adrenals/Urinary Tract: Adrenal glands are unremarkable. Kidneys are normal, without renal calculi, solid lesion, or hydronephrosis. Bladder is unremarkable. Stomach/Bowel: Stomach is within normal limits. Appendix appears normal. No evidence of bowel wall thickening, distention, or inflammatory changes. Moderate burden of stool and stool balls throughout the colon and rectum. Vascular/Lymphatic: Aortic atherosclerosis. No enlarged abdominal or pelvic lymph nodes. Reproductive: No mass or other abnormality. Other: No abdominal wall hernia or abnormality. No ascites. Musculoskeletal: No acute osseous findings. Unchanged occasional sclerotic metastases, for example in the right ilium (series 2, image 98) and superior endplate of L4 (series 2, image 86). Unchanged mild superior endplate wedge deformity of L2. IMPRESSION: 1. Unchanged treated perihilar mass in the left upper lobe measuring 3.6 x 2.4 cm with adjacent bandlike radiation fibrosis. 2. Additional small bilateral pulmonary nodules not significantly changed. Attention on follow-up. 3. Unchanged occasional sclerotic osseous metastases. 4. No evidence of lymphadenopathy or soft tissue metastatic disease in the chest, abdomen, or pelvis. 5. New trace  pleural effusions, nonspecific. 6. Coronary artery disease. Aortic Atherosclerosis (ICD10-I70.0). Electronically Signed   By: Marolyn JONETTA Jaksch M.D.   On: 08/19/2023 21:47

## 2023-09-14 NOTE — Assessment & Plan Note (Addendum)
 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  S/p lung radiation to left lung.  July CT and brain MRI showed stable disease.  No evidence of disease progression. Option of resuming Keytruda , versus observation was reviewed and discussed with patient. Patient and wife have enrolled to hospice services already. Despite hospice enrollment, wife says that she would like to keep [facility] as scheduled.

## 2023-09-14 NOTE — Assessment & Plan Note (Signed)
 Chronic ongoing issue, progressively worse.  Likely multifactorial, due to deconditioning, multiple foci spinal stenosis, possible drug side effects.  Off Keytruda  treatments currently. follow-up appointment with neurology

## 2023-09-15 ENCOUNTER — Ambulatory Visit: Admitting: Student in an Organized Health Care Education/Training Program

## 2023-09-16 ENCOUNTER — Ambulatory Visit: Admitting: Student in an Organized Health Care Education/Training Program

## 2023-09-17 ENCOUNTER — Ambulatory Visit: Admitting: Urology

## 2023-09-21 ENCOUNTER — Ambulatory Visit: Payer: PPO | Admitting: Radiation Oncology

## 2023-09-30 ENCOUNTER — Ambulatory Visit

## 2023-09-30 ENCOUNTER — Telehealth: Payer: Self-pay

## 2023-09-30 NOTE — Telephone Encounter (Signed)
 Called patient to cancel AWV due to him being on hospice. He is asking if gabapentin  can be increased to help with neuropathy or if there are any other recommendations.

## 2023-10-06 ENCOUNTER — Ambulatory Visit: Admitting: Urology

## 2023-10-08 ENCOUNTER — Telehealth: Admitting: Hospice and Palliative Medicine

## 2023-10-08 ENCOUNTER — Encounter: Payer: Self-pay | Admitting: Oncology

## 2023-10-15 ENCOUNTER — Inpatient Hospital Stay: Attending: Radiation Oncology | Admitting: Hospice and Palliative Medicine

## 2023-10-15 DIAGNOSIS — Z515 Encounter for palliative care: Secondary | ICD-10-CM

## 2023-10-15 NOTE — Progress Notes (Signed)
 VM left. I called and confirmed that patient is still being followed by [facility].

## 2023-10-20 ENCOUNTER — Encounter (HOSPITAL_COMMUNITY)

## 2023-10-20 ENCOUNTER — Ambulatory Visit

## 2023-10-21 ENCOUNTER — Telehealth: Payer: Self-pay | Admitting: Internal Medicine

## 2023-10-21 NOTE — Telephone Encounter (Signed)
 LVM with pt about change in appt date. He is now scheduled for 9/5. I asked him to call me back if he had any questions or if this did not work for him.

## 2023-10-29 ENCOUNTER — Ambulatory Visit: Payer: PPO | Admitting: Neurosurgery

## 2023-10-30 ENCOUNTER — Ambulatory Visit: Admitting: Internal Medicine

## 2023-11-03 ENCOUNTER — Inpatient Hospital Stay (HOSPITAL_COMMUNITY): Admission: RE | Admit: 2023-11-03 | Source: Ambulatory Visit

## 2023-11-03 ENCOUNTER — Ambulatory Visit: Attending: Vascular Surgery

## 2023-11-05 ENCOUNTER — Telehealth: Payer: Self-pay | Admitting: Internal Medicine

## 2023-11-05 NOTE — Telephone Encounter (Signed)
 Canceled appointment per incoming call from the patients spouse. Talked with Mrs.Gamarra and she is aware of the canceled appointment for the patient.

## 2023-11-06 ENCOUNTER — Inpatient Hospital Stay: Admitting: Internal Medicine

## 2023-12-03 ENCOUNTER — Ambulatory Visit: Admitting: Neurology

## 2023-12-03 ENCOUNTER — Encounter: Payer: Self-pay | Admitting: Neurology

## 2023-12-10 ENCOUNTER — Telehealth: Payer: Self-pay | Admitting: Family Medicine

## 2023-12-11 ENCOUNTER — Ambulatory Visit: Admitting: Internal Medicine

## 2023-12-15 ENCOUNTER — Encounter: Payer: Self-pay | Admitting: Oncology

## 2023-12-18 ENCOUNTER — Telehealth: Payer: Self-pay

## 2023-12-18 NOTE — Telephone Encounter (Signed)
 [facility] faxed in discahrge summary as well as medication profile for sign off for Clarification and clearance. Order received on 12/14/2023 Order signed and approved by Dr. Duanne on 12/17/2023 Faxed to [facility] on 12/18/2023 at:  Fax: [phone]  CMA, SRP

## 2024-01-02 NOTE — Telephone Encounter (Signed)
 Copied from CRM [id number]. Topic: General - Deceased Patient >> Dec 31, 2023  9:55 AM Gustabo D wrote: Name of caller: Darlene- Nurse [facility]- call back [phone]  Date of death: 31-Dec-2023   Name of funeral home: [facility]  Phone number of funeral home: [phone]  Provider that needs to sign form: Pickard  Timeline for signing: ASAP

## 2024-01-02 DEATH — deceased

## 2024-01-13 IMAGING — US IR IMAGING GUIDED PORT INSERTION
1 series · 1 of 1 positions shown · non-contrast
Comparison: none

INDICATION: IV access needed for chemotherapy

[Series 1: ir imaging guided port insertion · 0.06mm/px · 1 of 1 slices shown]
[im 1/1]
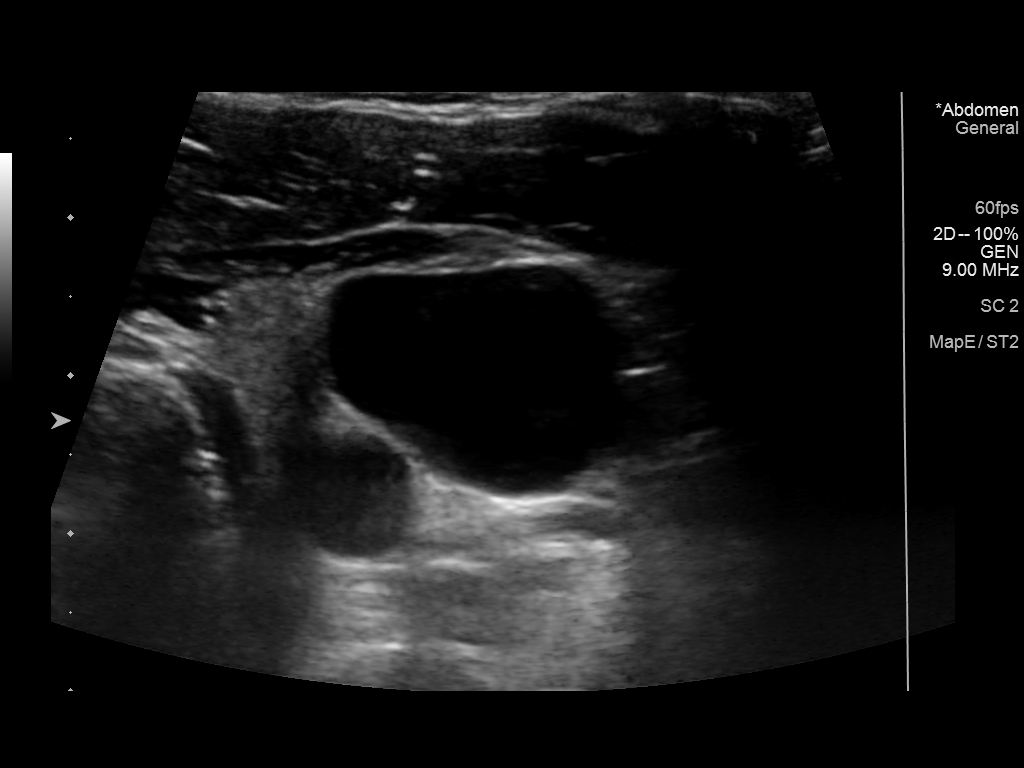

[1 of 1 positions shown; findings below may reference images not displayed]

EXAM:
Chest port placement using ultrasound and fluoroscopic guidance

MEDICATIONS:
Per EMR

ANESTHESIA/SEDATION:
Moderate (conscious) sedation was employed during this procedure. A
total of Versed 1 mg and Fentanyl 50 mcg was administered
intravenously.

Moderate Sedation Time: 20 minutes. The patient's level of
consciousness and vital signs were monitored continuously by
radiology nursing throughout the procedure under my direct
supervision.

FLUOROSCOPY TIME:  Fluoroscopy Time: 0.4 minutes (1.3 mGy)

COMPLICATIONS:
None immediate.

PROCEDURE:
Informed written consent was obtained from the patient after a
thorough discussion of the procedural risks, benefits and
alternatives. All questions were addressed. Maximal Sterile Barrier
Technique was utilized including caps, mask, sterile gowns, sterile
gloves, sterile drape, hand hygiene and skin antiseptic. A timeout
was performed prior to the initiation of the procedure.

The patient was placed supine on the exam table. The right neck and
chest was prepped and draped in the standard sterile fashion. A
preliminary ultrasound of the right neck was performed and
demonstrates a patent right internal jugular vein. A permanent
ultrasound image was stored in the electronic medical record. The
overlying skin was anesthetized with 1% Lidocaine. Using ultrasound
guidance, access was obtained into the right internal jugular vein
using a 21 gauge micropuncture set. A wire was advanced into the
SVC, a short incision was made at the puncture site, and serial
dilatation performed. Next, in an ipsilateral infraclavicular
location, an incision was made at the site of the subcutaneous
reservoir. Blunt dissection was used to open a pocket to contain the
reservoir. A subcutaneous tunnel was then created from the port site
to the puncture site. A(n) 8 Fr single lumen catheter was advanced
through the tunnel. The catheter was attached to the port and this
was placed in the subcutaneous pocket. Under fluoroscopic guidance,
a peel away sheath was placed, and the catheter was trimmed to the
appropriate length and was advanced into the central veins. The
catheter length is 23 cm. The tip of the catheter lies near the
superior cavoatrial junction. The port flushes and aspirates
appropriately. The port was flushed and locked with heparinized
saline. The port pocket was closed in 2 layers using 3-0 and 4-0
Vicryl/absorbable suture. Dermabond was also applied to both
incisions. The patient tolerated the procedure well and was
transferred to recovery in stable condition.
IMPRESSION: Successful placement of a right-sided chest port via the right
internal jugular vein. The port is ready for immediate use.
